# Patient Record
Sex: Female | Born: 1955 | Race: Black or African American | Hispanic: No | Marital: Single | Smoking: Former smoker
Health system: Southern US, Community
[De-identification: ages and names within clinical notes are randomized; demographics above are authoritative.]

## PROBLEM LIST (undated history)

## (undated) DIAGNOSIS — L0390 Cellulitis, unspecified: Secondary | ICD-10-CM

## (undated) DIAGNOSIS — M25519 Pain in unspecified shoulder: Secondary | ICD-10-CM

## (undated) DIAGNOSIS — K589 Irritable bowel syndrome without diarrhea: Secondary | ICD-10-CM

## (undated) DIAGNOSIS — L853 Xerosis cutis: Secondary | ICD-10-CM

## (undated) DIAGNOSIS — K449 Diaphragmatic hernia without obstruction or gangrene: Secondary | ICD-10-CM

## (undated) DIAGNOSIS — I639 Cerebral infarction, unspecified: Secondary | ICD-10-CM

## (undated) DIAGNOSIS — K5900 Constipation, unspecified: Secondary | ICD-10-CM

## (undated) DIAGNOSIS — Z89519 Acquired absence of unspecified leg below knee: Secondary | ICD-10-CM

## (undated) DIAGNOSIS — R0789 Other chest pain: Secondary | ICD-10-CM

## (undated) DIAGNOSIS — I10 Essential (primary) hypertension: Secondary | ICD-10-CM

## (undated) DIAGNOSIS — G629 Polyneuropathy, unspecified: Secondary | ICD-10-CM

## (undated) DIAGNOSIS — E11319 Type 2 diabetes mellitus with unspecified diabetic retinopathy without macular edema: Secondary | ICD-10-CM

## (undated) DIAGNOSIS — E669 Obesity, unspecified: Secondary | ICD-10-CM

## (undated) DIAGNOSIS — K219 Gastro-esophageal reflux disease without esophagitis: Secondary | ICD-10-CM

## (undated) DIAGNOSIS — E785 Hyperlipidemia, unspecified: Secondary | ICD-10-CM

## (undated) DIAGNOSIS — E119 Type 2 diabetes mellitus without complications: Secondary | ICD-10-CM

## (undated) DIAGNOSIS — R42 Dizziness and giddiness: Secondary | ICD-10-CM

## (undated) DIAGNOSIS — Z720 Tobacco use: Secondary | ICD-10-CM

## (undated) DIAGNOSIS — N189 Chronic kidney disease, unspecified: Secondary | ICD-10-CM

## (undated) DIAGNOSIS — R55 Syncope and collapse: Secondary | ICD-10-CM

## (undated) HISTORY — PX: CARDIAC CATHETERIZATION: SHX172

## (undated) HISTORY — PX: OTHER SURGICAL HISTORY: SHX169

## (undated) HISTORY — DX: Obesity, unspecified: E66.9

## (undated) HISTORY — DX: Diaphragmatic hernia without obstruction or gangrene: K44.9

## (undated) HISTORY — DX: Syncope and collapse: R55

## (undated) HISTORY — PX: COLONOSCOPY: SHX174

## (undated) HISTORY — PX: EYE SURGERY: SHX253

## (undated) HISTORY — DX: Pain in unspecified shoulder: M25.519

## (undated) HISTORY — DX: Gastro-esophageal reflux disease without esophagitis: K21.9

## (undated) HISTORY — DX: Irritable bowel syndrome, unspecified: K58.9

## (undated) HISTORY — DX: Acquired absence of unspecified leg below knee: Z89.519

## (undated) HISTORY — PX: CHOLECYSTECTOMY: SHX55

## (undated) HISTORY — DX: Type 2 diabetes mellitus with unspecified diabetic retinopathy without macular edema: E11.319

---

## 1997-09-28 ENCOUNTER — Other Ambulatory Visit: Admission: RE | Admit: 1997-09-28 | Discharge: 1997-09-28 | Payer: Self-pay | Admitting: Internal Medicine

## 1998-12-27 ENCOUNTER — Ambulatory Visit (HOSPITAL_BASED_OUTPATIENT_CLINIC_OR_DEPARTMENT_OTHER): Admission: RE | Admit: 1998-12-27 | Discharge: 1998-12-27 | Payer: Self-pay | Admitting: *Deleted

## 2000-02-14 ENCOUNTER — Encounter: Admission: RE | Admit: 2000-02-14 | Discharge: 2000-02-14 | Payer: Self-pay | Admitting: Internal Medicine

## 2000-02-14 ENCOUNTER — Encounter: Payer: Self-pay | Admitting: Internal Medicine

## 2001-02-05 ENCOUNTER — Ambulatory Visit (HOSPITAL_COMMUNITY): Admission: RE | Admit: 2001-02-05 | Discharge: 2001-02-05 | Payer: Self-pay | Admitting: Internal Medicine

## 2001-02-05 ENCOUNTER — Encounter: Payer: Self-pay | Admitting: Internal Medicine

## 2001-02-25 ENCOUNTER — Ambulatory Visit (HOSPITAL_COMMUNITY): Admission: RE | Admit: 2001-02-25 | Discharge: 2001-02-25 | Payer: Self-pay | Admitting: Gastroenterology

## 2001-05-24 ENCOUNTER — Emergency Department (HOSPITAL_COMMUNITY): Admission: EM | Admit: 2001-05-24 | Discharge: 2001-05-24 | Payer: Self-pay | Admitting: Emergency Medicine

## 2001-07-02 ENCOUNTER — Encounter: Payer: Self-pay | Admitting: Internal Medicine

## 2001-07-02 ENCOUNTER — Encounter: Admission: RE | Admit: 2001-07-02 | Discharge: 2001-07-02 | Payer: Self-pay | Admitting: Internal Medicine

## 2002-09-01 ENCOUNTER — Encounter: Payer: Self-pay | Admitting: Gastroenterology

## 2002-09-01 ENCOUNTER — Encounter: Admission: RE | Admit: 2002-09-01 | Discharge: 2002-09-01 | Payer: Self-pay | Admitting: Gastroenterology

## 2002-09-16 ENCOUNTER — Encounter: Admission: RE | Admit: 2002-09-16 | Discharge: 2002-09-16 | Payer: Self-pay | Admitting: Gastroenterology

## 2002-09-16 ENCOUNTER — Encounter: Payer: Self-pay | Admitting: Gastroenterology

## 2003-03-03 ENCOUNTER — Encounter: Admission: RE | Admit: 2003-03-03 | Discharge: 2003-03-03 | Payer: Self-pay | Admitting: Internal Medicine

## 2003-03-03 ENCOUNTER — Encounter: Payer: Self-pay | Admitting: Internal Medicine

## 2004-03-29 ENCOUNTER — Encounter: Admission: RE | Admit: 2004-03-29 | Discharge: 2004-03-29 | Payer: Self-pay | Admitting: Internal Medicine

## 2005-05-05 ENCOUNTER — Encounter: Admission: RE | Admit: 2005-05-05 | Discharge: 2005-05-05 | Payer: Self-pay | Admitting: Internal Medicine

## 2005-07-20 ENCOUNTER — Emergency Department (HOSPITAL_COMMUNITY): Admission: EM | Admit: 2005-07-20 | Discharge: 2005-07-20 | Payer: Self-pay | Admitting: Family Medicine

## 2005-07-22 ENCOUNTER — Emergency Department (HOSPITAL_COMMUNITY): Admission: EM | Admit: 2005-07-22 | Discharge: 2005-07-22 | Payer: Self-pay | Admitting: Family Medicine

## 2006-06-03 ENCOUNTER — Encounter: Admission: RE | Admit: 2006-06-03 | Discharge: 2006-06-03 | Payer: Self-pay | Admitting: Internal Medicine

## 2007-02-15 ENCOUNTER — Emergency Department (HOSPITAL_COMMUNITY): Admission: EM | Admit: 2007-02-15 | Discharge: 2007-02-15 | Payer: Self-pay | Admitting: Emergency Medicine

## 2007-02-18 ENCOUNTER — Emergency Department (HOSPITAL_COMMUNITY): Admission: EM | Admit: 2007-02-18 | Discharge: 2007-02-18 | Payer: Self-pay | Admitting: Emergency Medicine

## 2007-03-23 ENCOUNTER — Ambulatory Visit: Payer: Self-pay | Admitting: Cardiology

## 2007-03-23 ENCOUNTER — Inpatient Hospital Stay (HOSPITAL_COMMUNITY): Admission: EM | Admit: 2007-03-23 | Discharge: 2007-03-25 | Payer: Self-pay | Admitting: Emergency Medicine

## 2007-03-25 ENCOUNTER — Encounter: Payer: Self-pay | Admitting: Thoracic Surgery (Cardiothoracic Vascular Surgery)

## 2007-04-01 ENCOUNTER — Emergency Department (HOSPITAL_COMMUNITY): Admission: EM | Admit: 2007-04-01 | Discharge: 2007-04-01 | Payer: Self-pay | Admitting: Emergency Medicine

## 2007-04-12 ENCOUNTER — Ambulatory Visit: Payer: Self-pay | Admitting: Cardiology

## 2007-04-12 ENCOUNTER — Inpatient Hospital Stay (HOSPITAL_COMMUNITY): Admission: EM | Admit: 2007-04-12 | Discharge: 2007-04-15 | Payer: Self-pay | Admitting: Emergency Medicine

## 2007-04-12 ENCOUNTER — Emergency Department (HOSPITAL_COMMUNITY): Admission: EM | Admit: 2007-04-12 | Discharge: 2007-04-12 | Payer: Self-pay | Admitting: Emergency Medicine

## 2007-04-13 ENCOUNTER — Ambulatory Visit: Payer: Self-pay | Admitting: *Deleted

## 2007-04-13 ENCOUNTER — Encounter (INDEPENDENT_AMBULATORY_CARE_PROVIDER_SITE_OTHER): Payer: Self-pay | Admitting: Internal Medicine

## 2007-04-23 ENCOUNTER — Emergency Department (HOSPITAL_COMMUNITY): Admission: EM | Admit: 2007-04-23 | Discharge: 2007-04-23 | Payer: Self-pay | Admitting: Emergency Medicine

## 2007-04-27 ENCOUNTER — Inpatient Hospital Stay (HOSPITAL_COMMUNITY): Admission: EM | Admit: 2007-04-27 | Discharge: 2007-05-02 | Payer: Self-pay | Admitting: Emergency Medicine

## 2007-04-28 ENCOUNTER — Encounter (INDEPENDENT_AMBULATORY_CARE_PROVIDER_SITE_OTHER): Payer: Self-pay | Admitting: *Deleted

## 2007-05-07 ENCOUNTER — Emergency Department (HOSPITAL_COMMUNITY): Admission: EM | Admit: 2007-05-07 | Discharge: 2007-05-08 | Payer: Self-pay | Admitting: *Deleted

## 2007-05-26 ENCOUNTER — Inpatient Hospital Stay (HOSPITAL_COMMUNITY): Admission: EM | Admit: 2007-05-26 | Discharge: 2007-06-03 | Payer: Self-pay | Admitting: Emergency Medicine

## 2007-05-26 ENCOUNTER — Ambulatory Visit: Payer: Self-pay | Admitting: Cardiology

## 2007-05-27 ENCOUNTER — Encounter (INDEPENDENT_AMBULATORY_CARE_PROVIDER_SITE_OTHER): Payer: Self-pay | Admitting: Internal Medicine

## 2007-05-27 ENCOUNTER — Ambulatory Visit: Payer: Self-pay | Admitting: Vascular Surgery

## 2007-06-02 ENCOUNTER — Encounter (INDEPENDENT_AMBULATORY_CARE_PROVIDER_SITE_OTHER): Payer: Self-pay | Admitting: Gastroenterology

## 2007-06-08 ENCOUNTER — Emergency Department (HOSPITAL_COMMUNITY): Admission: EM | Admit: 2007-06-08 | Discharge: 2007-06-08 | Payer: Self-pay | Admitting: Emergency Medicine

## 2007-06-16 ENCOUNTER — Ambulatory Visit: Payer: Self-pay | Admitting: Cardiology

## 2007-09-07 ENCOUNTER — Ambulatory Visit: Payer: Self-pay | Admitting: Cardiology

## 2007-09-16 ENCOUNTER — Ambulatory Visit: Payer: Self-pay | Admitting: Internal Medicine

## 2007-09-17 HISTORY — PX: OTHER SURGICAL HISTORY: SHX169

## 2007-09-20 ENCOUNTER — Ambulatory Visit: Payer: Self-pay | Admitting: Cardiology

## 2007-09-20 ENCOUNTER — Inpatient Hospital Stay (HOSPITAL_COMMUNITY): Admission: EM | Admit: 2007-09-20 | Discharge: 2007-09-21 | Payer: Self-pay | Admitting: Emergency Medicine

## 2007-10-12 ENCOUNTER — Encounter: Admission: RE | Admit: 2007-10-12 | Discharge: 2007-10-12 | Payer: Self-pay | Admitting: Internal Medicine

## 2007-10-13 ENCOUNTER — Ambulatory Visit: Payer: Self-pay | Admitting: Internal Medicine

## 2007-11-09 ENCOUNTER — Encounter: Admission: RE | Admit: 2007-11-09 | Discharge: 2007-11-09 | Payer: Self-pay | Admitting: Internal Medicine

## 2007-11-12 ENCOUNTER — Emergency Department (HOSPITAL_COMMUNITY): Admission: EM | Admit: 2007-11-12 | Discharge: 2007-11-12 | Payer: Self-pay | Admitting: Emergency Medicine

## 2008-03-21 ENCOUNTER — Emergency Department (HOSPITAL_COMMUNITY): Admission: EM | Admit: 2008-03-21 | Discharge: 2008-03-21 | Payer: Self-pay | Admitting: Emergency Medicine

## 2008-05-30 DIAGNOSIS — I639 Cerebral infarction, unspecified: Secondary | ICD-10-CM | POA: Insufficient documentation

## 2008-05-30 DIAGNOSIS — E785 Hyperlipidemia, unspecified: Secondary | ICD-10-CM | POA: Insufficient documentation

## 2008-05-30 DIAGNOSIS — E669 Obesity, unspecified: Secondary | ICD-10-CM | POA: Insufficient documentation

## 2008-05-30 DIAGNOSIS — I2510 Atherosclerotic heart disease of native coronary artery without angina pectoris: Secondary | ICD-10-CM | POA: Insufficient documentation

## 2008-05-30 DIAGNOSIS — R55 Syncope and collapse: Secondary | ICD-10-CM

## 2008-05-30 DIAGNOSIS — K219 Gastro-esophageal reflux disease without esophagitis: Secondary | ICD-10-CM | POA: Insufficient documentation

## 2008-05-30 DIAGNOSIS — E1165 Type 2 diabetes mellitus with hyperglycemia: Secondary | ICD-10-CM

## 2008-05-30 DIAGNOSIS — E1140 Type 2 diabetes mellitus with diabetic neuropathy, unspecified: Secondary | ICD-10-CM

## 2008-05-30 DIAGNOSIS — I679 Cerebrovascular disease, unspecified: Secondary | ICD-10-CM | POA: Insufficient documentation

## 2008-11-02 ENCOUNTER — Ambulatory Visit: Payer: Self-pay | Admitting: Cardiology

## 2008-11-02 ENCOUNTER — Inpatient Hospital Stay (HOSPITAL_COMMUNITY): Admission: EM | Admit: 2008-11-02 | Discharge: 2008-11-04 | Payer: Self-pay | Admitting: Emergency Medicine

## 2008-11-02 ENCOUNTER — Telehealth: Payer: Self-pay | Admitting: Internal Medicine

## 2009-01-02 ENCOUNTER — Encounter: Payer: Self-pay | Admitting: Internal Medicine

## 2009-03-12 ENCOUNTER — Ambulatory Visit: Payer: Self-pay | Admitting: Cardiology

## 2009-03-12 ENCOUNTER — Inpatient Hospital Stay (HOSPITAL_COMMUNITY): Admission: EM | Admit: 2009-03-12 | Discharge: 2009-03-14 | Payer: Self-pay | Admitting: Emergency Medicine

## 2009-03-13 ENCOUNTER — Encounter: Payer: Self-pay | Admitting: Cardiology

## 2009-03-13 ENCOUNTER — Encounter: Payer: Self-pay | Admitting: Internal Medicine

## 2009-03-13 ENCOUNTER — Ambulatory Visit: Payer: Self-pay | Admitting: Vascular Surgery

## 2009-03-28 ENCOUNTER — Encounter (INDEPENDENT_AMBULATORY_CARE_PROVIDER_SITE_OTHER): Payer: Self-pay | Admitting: *Deleted

## 2009-04-02 DIAGNOSIS — K449 Diaphragmatic hernia without obstruction or gangrene: Secondary | ICD-10-CM | POA: Insufficient documentation

## 2009-04-02 DIAGNOSIS — I25110 Atherosclerotic heart disease of native coronary artery with unstable angina pectoris: Secondary | ICD-10-CM | POA: Insufficient documentation

## 2009-04-02 DIAGNOSIS — K589 Irritable bowel syndrome without diarrhea: Secondary | ICD-10-CM

## 2009-04-02 DIAGNOSIS — I10 Essential (primary) hypertension: Secondary | ICD-10-CM | POA: Insufficient documentation

## 2009-04-02 DIAGNOSIS — I2510 Atherosclerotic heart disease of native coronary artery without angina pectoris: Secondary | ICD-10-CM | POA: Insufficient documentation

## 2009-04-02 DIAGNOSIS — G609 Hereditary and idiopathic neuropathy, unspecified: Secondary | ICD-10-CM

## 2009-05-31 IMAGING — CR DG ABDOMEN ACUTE W/ 1V CHEST
3 series · 3 of 3 positions shown · non-contrast
Comparison: none

HISTORY: Constipation

[w chest pa]
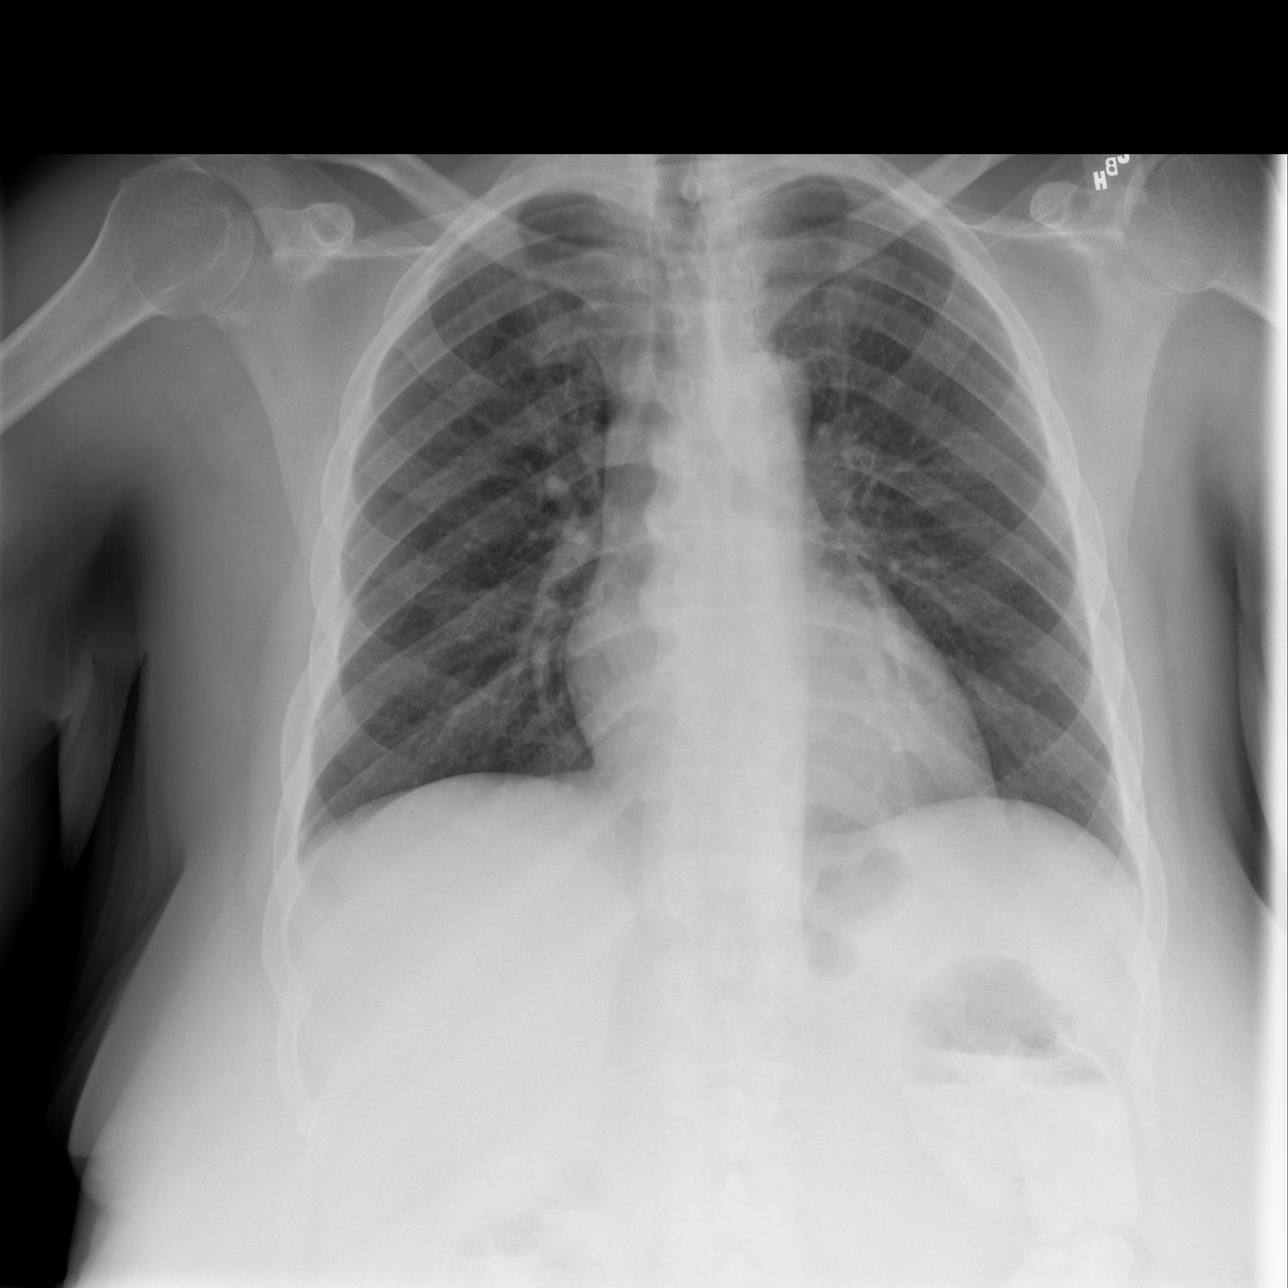

[w abdomen upright]
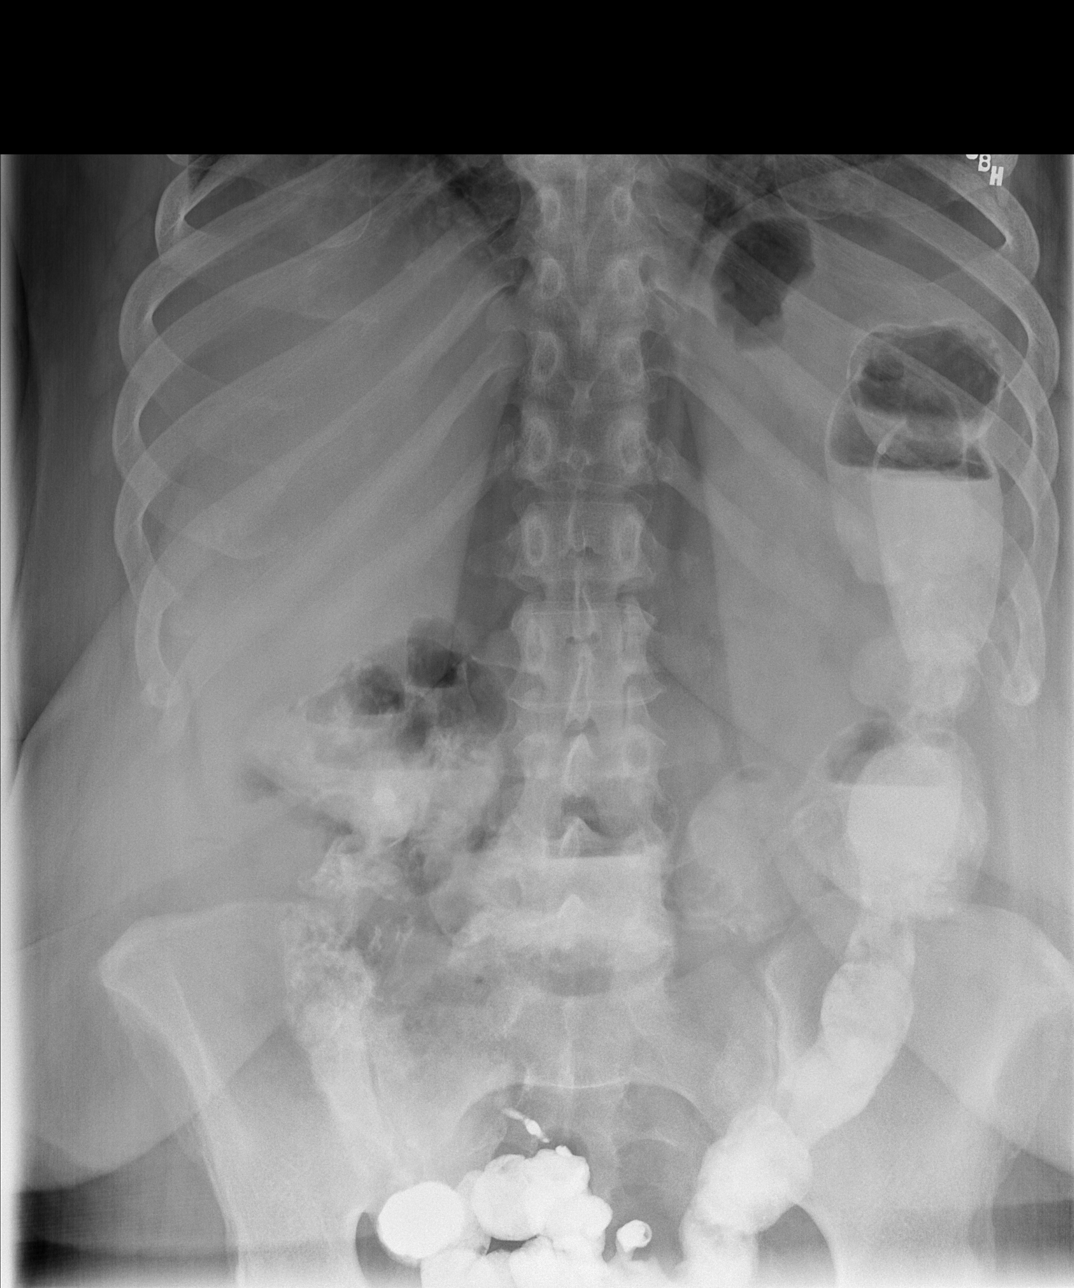

[t abdomen supine]
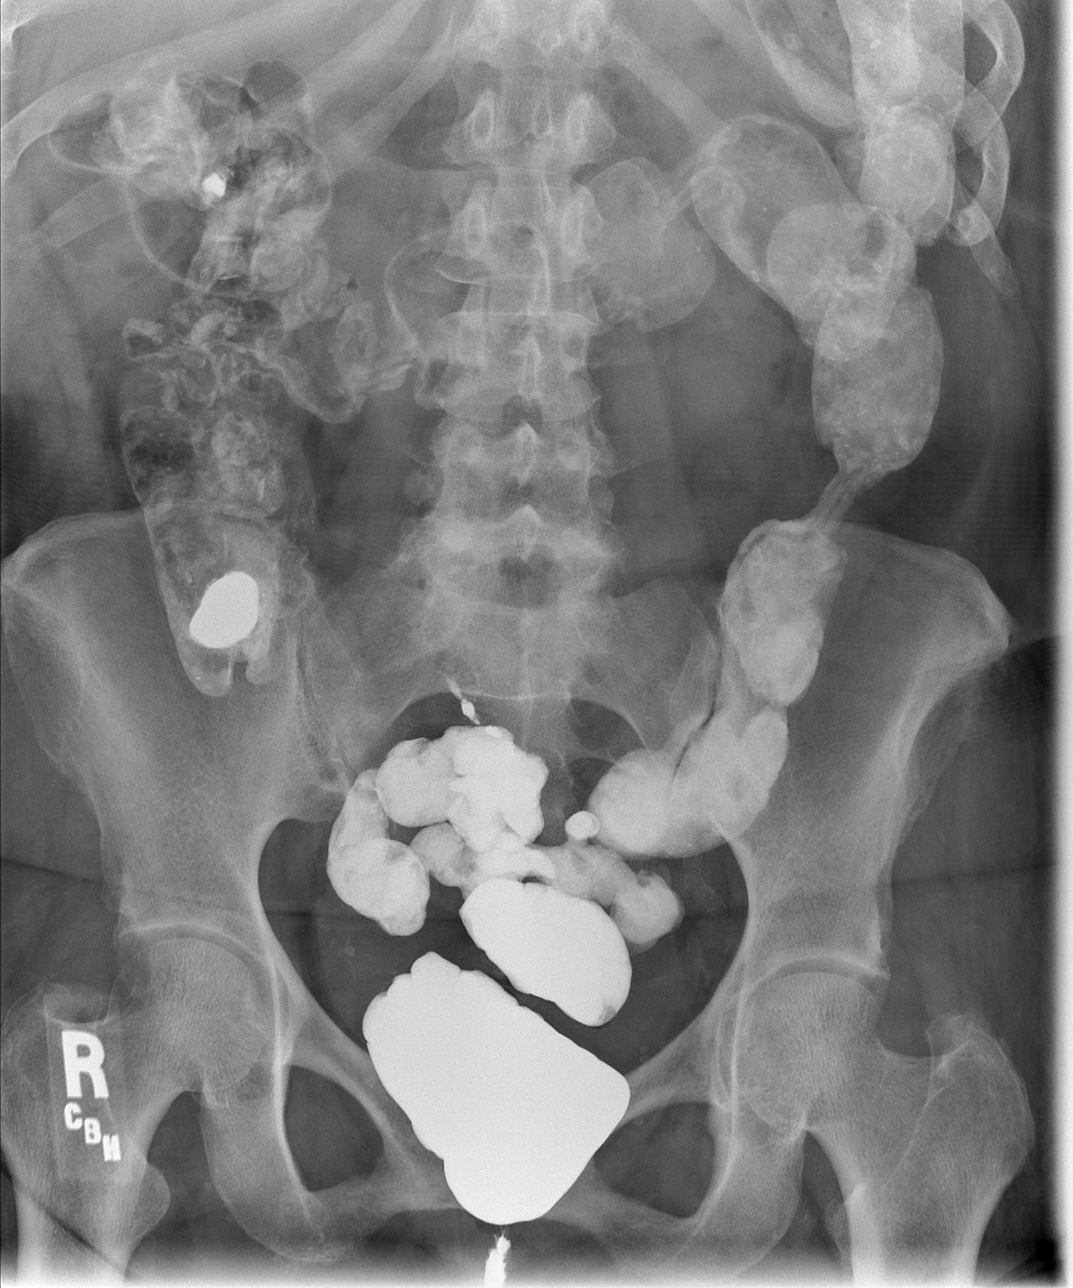

[3 of 3 positions shown; findings below may reference images not displayed]

ABDOMEN ACUTE WITH PA CHEST:

Comparison 04/23/2007

Normal heart size, mediastinal contours, and pulmonary vascularity.
Minimal chronic bronchitic changes.
Lungs otherwise clear.

Retained barium throughout colon, prior GI exam on 05/01/2007.
No bowel dilatation, bowel wall thickening, or free air.
No small bowel distention noted.
Bones unremarkable.
IMPRESSION: Dense retained barium throughout colon status post GI procedure on 05/01/2007.
Otherwise negative exam.

## 2009-10-04 ENCOUNTER — Observation Stay (HOSPITAL_COMMUNITY): Admission: EM | Admit: 2009-10-04 | Discharge: 2009-10-05 | Payer: Self-pay | Admitting: Emergency Medicine

## 2009-10-04 ENCOUNTER — Ambulatory Visit: Payer: Self-pay | Admitting: Cardiology

## 2009-10-16 ENCOUNTER — Emergency Department (HOSPITAL_COMMUNITY): Admission: EM | Admit: 2009-10-16 | Discharge: 2009-10-16 | Payer: Self-pay | Admitting: Emergency Medicine

## 2009-11-09 ENCOUNTER — Observation Stay (HOSPITAL_COMMUNITY): Admission: EM | Admit: 2009-11-09 | Discharge: 2009-11-12 | Payer: Self-pay | Admitting: Emergency Medicine

## 2009-11-12 ENCOUNTER — Telehealth: Payer: Self-pay | Admitting: Internal Medicine

## 2009-12-12 ENCOUNTER — Ambulatory Visit: Payer: Self-pay | Admitting: Cardiology

## 2009-12-31 ENCOUNTER — Ambulatory Visit: Payer: Self-pay

## 2009-12-31 ENCOUNTER — Ambulatory Visit (HOSPITAL_COMMUNITY): Admission: RE | Admit: 2009-12-31 | Discharge: 2009-12-31 | Payer: Self-pay | Admitting: Internal Medicine

## 2009-12-31 ENCOUNTER — Ambulatory Visit: Payer: Self-pay | Admitting: Cardiology

## 2009-12-31 ENCOUNTER — Ambulatory Visit: Payer: Self-pay | Admitting: Internal Medicine

## 2010-03-18 ENCOUNTER — Encounter: Admission: RE | Admit: 2010-03-18 | Discharge: 2010-03-18 | Payer: Self-pay | Admitting: Internal Medicine

## 2010-06-18 NOTE — Assessment & Plan Note (Signed)
Summary: eph/jml   Visit Type:  EPH Primary Provider:  Dr. August Luz  CC:  edema/feet.Marland Kitchendenies any cp or sob.  History of Present Illness: [name] returns to office today after being discharged the hospital with atypical stabbing chest pain. She has a history of multiple cardiac risk factors with nonobstructive disease with medical management for several years. Her ejection fraction in the past has been around 35%.  The last time I saw her was in 2009 for syncope. Extensive evaluation was done including a loop monitor which he still has. She is scheduled to have this out soon with Dr. Johney Frame.  She has had no recurrent syncope or chest pain. She ruled out for myocardial infarction. She was found to have some compression in the cervical and thoracic spine and was evaluated by Dr. Yetta Barre of neurosurgery. Conservative management was recommended.  Meds are reviewed. She seems to be compliant. She has maintained her weight loss from several years ago.  Current Medications (verified): 1)  Aggrenox 25-200 Mg Xr12h-Cap (Aspirin-Dipyridamole) .Marland Kitchen.. 1 Cap Once Daily 2)  Zocor 20 Mg Tabs (Simvastatin) .Marland Kitchen.. 1 Tab At Bedtime 3)  Metformin Hcl 500 Mg Tabs (Metformin Hcl) .Marland Kitchen.. 1 Tab Two Times A Day 4)  Lisinopril 10 Mg Tabs (Lisinopril) .Marland Kitchen.. 1 Tab Once Daily 5)  Lyrica 150 Mg Caps (Pregabalin) .Marland Kitchen.. 1 Cap Two Times A Day 6)  Oxycodone Hcl 5 Mg Caps (Oxycodone Hcl) .Marland Kitchen.. 1 Cap Q 8 Hr As Needed 7)  Glimepiride 4 Mg Tabs (Glimepiride) .Marland Kitchen.. 1 Tab Qam 8)  Lantus 100 Unit/ml Soln (Insulin Glargine) .Marland Kitchen.. 15 Units At Bedtime 9)  Tylenol Extra Strength 500 Mg Tabs (Acetaminophen) .... As Needed  Allergies: 1)  ! Penicillin  Past History:  Past Medical History: Last updated: 04/02/2009 CAD, NATIVE VESSEL (ICD-414.01) non-critical  by cath 11-08 - med rx CARDIOMYOPATHY, SECONDARY (ICD-425.9) HYPERTENSION (ICD-401.9) HYPERLIPIDEMIA (ICD-272.4) SYNCOPE (ICD-780.2)Near-syncopal episode in November, 2008.  LEFT  VENTRICULAR FUNCTION, DECREASED (ICD-429.2) Hx of CVA (ICD-434.91) PERIPHERAL NEUROPATHY (ICD-356.9) OBESITY (ICD-278.00) GERD (ICD-530.81) DIABETES MELLITUS, TYPE II, HX OF (ICD-V12.2) HIATAL HERNIA (ICD-553.3) IRRITABLE BOWEL SYNDROME (ICD-564.1)  Past Surgical History: Last updated: June 20, 2008 5-09 Invasive electrophysiologic study followed by        insertion of an implantable loop recorder.  Colonoscopy. Cholecystectomy.   Multiple toe surgeries.  Cath  Family History: Last updated: 2008/06/20 Mother died at age 15 from pneumonia, end-stage renal   disease, gallbladder cancer and CHF.  Father is alive at age 58's with a   history of diabetes and coronary artery disease.  She has 1 brother and   2 sisters.  There is no history of stroke, diabetes or coronary artery   disease in her siblings.  One sister has a colostomy.   Social History: Last updated: 06/20/2008  The patient is single.  She smokes about a pack a day   over 30 years.  She drinks beers, several beers on Sundays.  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 She   denies any drug use and does not routinely exercise.   Review of Systems       negative other than history of present illness  Vital Signs:  Patient profile:   54 year old female Height:      66 inches Weight:      250 pounds BMI:     40 .50 Pulse rate:   93 / minute Pulse rhythm:   irregular BP sitting:   118 / 72  (  left arm) Cuff size:   large  Vitals Entered By: [name], CMA (December 12, 2009 2:08 PM)  Physical Exam  General:  obese.   Head:  normocephalic and atraumatic Eyes:  PERRLA/EOM intact; conjunctiva and lids normal. Neck:  Neck supple, no JVD. No masses, thyromegaly or abnormal cervical nodes. Chest [name]:  no deformities or breast masses noted Lungs:  Clear bilaterally to auscultation and percussion. Heart:  PMI difficult to appreciate, normal S1-S2, no gallop Msk:  decreased ROM.   Pulses:   diminished in the left lower extremity, feet are warm no signs of ulceration or injury Extremities:  trace left pedal edema and trace right pedal edema.   Neurologic:  Alert and oriented x 3. Skin:  Intact without lesions or rashes. Psych:  Normal affect.   EKG  Procedure date:  12/12/2009  Findings:      normal sinus rhythm, poor R. progression, low voltage.  Impression & Recommendations:  Problem # 1:  CAD, NATIVE VESSEL (ICD-414.01)   Continued aggressive secondary prevention. Meds reviewed. No changes. Followup in a year. The following medications were removed from the medication list:    Carvedilol 3.125 Mg Tabs (Carvedilol) .Marland Kitchen... Take 1 tablet by mouth twice a day    Isosorbide Mononitrate Cr 30 Mg Xr24h-tab (Isosorbide mononitrate) .Marland Kitchen... Take 1/2 tablet by mouth once a day Her updated medication list for this problem includes:    Aggrenox 25-200 Mg Xr12h-cap (Aspirin-dipyridamole) .Marland Kitchen... 1 cap once daily    Lisinopril 10 Mg Tabs (Lisinopril) .Marland Kitchen... 1 tab once daily  The following medications were removed from the medication list:    Carvedilol 3.125 Mg Tabs (Carvedilol) .Marland Kitchen... Take 1 tablet by mouth twice a day    Isosorbide Mononitrate Cr 30 Mg Xr24h-tab (Isosorbide mononitrate) .Marland Kitchen... Take 1/2 tablet by mouth once a day Her updated medication list for this problem includes:    Aggrenox 25-200 Mg Xr12h-cap (Aspirin-dipyridamole) .Marland Kitchen... 1 cap once daily    Lisinopril 10 Mg Tabs (Lisinopril) .Marland Kitchen... 1 tab once daily  Orders: Echocardiogram (Echo)  Problem # 2:  CARDIOMYOPATHY, SECONDARY (ICD-425.9)  Continue beta blockade and ACE inhibitor. Will repeat echocardiogram to evaluate LV function. Last EF was 35%. The following medications were removed from the medication list:    Carvedilol 3.125 Mg Tabs (Carvedilol) .Marland Kitchen... Take 1 tablet by mouth twice a day    Isosorbide Mononitrate Cr 30 Mg Xr24h-tab (Isosorbide mononitrate) .Marland Kitchen... Take 1/2 tablet by mouth once a day Her updated  medication list for this problem includes:    Aggrenox 25-200 Mg Xr12h-cap (Aspirin-dipyridamole) .Marland Kitchen... 1 cap once daily    Lisinopril 10 Mg Tabs (Lisinopril) .Marland Kitchen... 1 tab once daily  Orders: Echocardiogram (Echo)  Problem # 3:  HYPERTENSION (ICD-401.9) Assessment: Improved  The following medications were removed from the medication list:    Carvedilol 3.125 Mg Tabs (Carvedilol) .Marland Kitchen... Take 1 tablet by mouth twice a day Her updated medication list for this problem includes:    Lisinopril 10 Mg Tabs (Lisinopril) .Marland Kitchen... 1 tab once daily  The following medications were removed from the medication list:    Carvedilol 3.125 Mg Tabs (Carvedilol) .Marland Kitchen... Take 1 tablet by mouth twice a day Her updated medication list for this problem includes:    Lisinopril 10 Mg Tabs (Lisinopril) .Marland Kitchen... 1 tab once daily  Problem # 4:  HYPERLIPIDEMIA (ICD-272.4)  Her updated medication list for this problem includes:    Zocor 20 Mg Tabs (Simvastatin) .Marland Kitchen... 1 tab at bedtime  Her updated medication  list for this problem includes:    Zocor 20 Mg Tabs (Simvastatin) .Marland Kitchen... 1 tab at bedtime  Orders: Echocardiogram (Echo)  Problem # 5:  SYNCOPE (ICD-780.2) Resolved. The following medications were removed from the medication list:    Carvedilol 3.125 Mg Tabs (Carvedilol) .Marland Kitchen... Take 1 tablet by mouth twice a day    Isosorbide Mononitrate Cr 30 Mg Xr24h-tab (Isosorbide mononitrate) .Marland Kitchen... Take 1/2 tablet by mouth once a day Her updated medication list for this problem includes:    Aggrenox 25-200 Mg Xr12h-cap (Aspirin-dipyridamole) .Marland Kitchen... 1 cap once daily    Lisinopril 10 Mg Tabs (Lisinopril) .Marland Kitchen... 1 tab once daily  Patient Instructions: 1)  Your physician recommends that you schedule a follow-up appointment in: 12 months with Dr Johney Frame 2)  Your physician recommends that you continue on your current medications as directed. Please refer to the Current Medication list given to you today. 3)  Your physician has  requested that you have an echocardiogram.  Echocardiography is a painless test that uses sound waves to create images of your heart. It provides your doctor with information about the size and shape of your heart and how well your heart's chambers and valves are working.  This procedure takes approximately one hour. There are no restrictions for this procedure.

## 2010-06-18 NOTE — Assessment & Plan Note (Signed)
Summary: appt @9 :00/ eval for loop recorder removal/jml   Visit Type:  Follow-up Primary Provider:  Dr. August Luz   History of Present Illness: The patient presents today for routine electrophysiology followup. She reports doing very well since last being seen clinic.  She denies any further episodes of syncope.  The patient denies symptoms of palpitations, chest pain, shortness of breath, orthopnea, PND, lower extremity edema, dizziness, presyncope, or neurologic sequela. The patient is tolerating medications without difficulties and is otherwise without complaint today.   Current Medications (verified): 1)  Aggrenox 25-200 Mg Xr12h-Cap (Aspirin-Dipyridamole) .Marland Kitchen.. 1 Cap Once Daily 2)  Zocor 20 Mg Tabs (Simvastatin) .Marland Kitchen.. 1 Tab At Bedtime 3)  Metformin Hcl 500 Mg Tabs (Metformin Hcl) .Marland Kitchen.. 1 Tab Two Times A Day 4)  Lisinopril 10 Mg Tabs (Lisinopril) .Marland Kitchen.. 1 Tab Once Daily 5)  Lyrica 150 Mg Caps (Pregabalin) .Marland Kitchen.. 1 Cap Two Times A Day 6)  Oxycodone Hcl 5 Mg Caps (Oxycodone Hcl) .Marland Kitchen.. 1 Cap Q 8 Hr As Needed 7)  Glimepiride 4 Mg Tabs (Glimepiride) .Marland Kitchen.. 1 Tab Qam 8)  Lantus 100 Unit/ml Soln (Insulin Glargine) .Marland Kitchen.. 15 Units At Bedtime 9)  Ibuprofen 800 Mg Tabs (Ibuprofen) .... Uad  Allergies: 1)  ! Penicillin  Past History:  Past Medical History: Reviewed history from 04/02/2009 and no changes required. CAD, NATIVE VESSEL (ICD-414.01) non-critical  by cath 11-08 - med rx CARDIOMYOPATHY, SECONDARY (ICD-425.9) HYPERTENSION (ICD-401.9) HYPERLIPIDEMIA (ICD-272.4) SYNCOPE (ICD-780.2)Near-syncopal episode in November, 2008.  LEFT VENTRICULAR FUNCTION, DECREASED (ICD-429.2) Hx of CVA (ICD-434.91) PERIPHERAL NEUROPATHY (ICD-356.9) OBESITY (ICD-278.00) GERD (ICD-530.81) DIABETES MELLITUS, TYPE II, HX OF (ICD-V12.2) HIATAL HERNIA (ICD-553.3) IRRITABLE BOWEL SYNDROME (ICD-564.1)  Past Surgical History: Reviewed history from 05/30/2008 and no changes required. 5-09 Invasive electrophysiologic  study followed by        insertion of an implantable loop recorder.  Colonoscopy. Cholecystectomy.   Multiple toe surgeries.  Cath  Social History: Reviewed history from 05/30/2008 and no changes required.  The patient is single.  She smokes about a pack a day   over 30 years.  She drinks beers, several beers on Sundays.  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 She   denies any drug use and does not routinely exercise.   Vital Signs:  Patient profile:   54 year old female Height:      66 inches Weight:      255 pounds BMI:     41 .31 Pulse rate:   72 / minute BP sitting:   118 / 82  (right arm)  Vitals Entered By: Laurance Flatten CMA (December 31, 2009 2:49 PM)  Physical Exam  General:  obese, NAD Head:  normocephalic and atraumatic Eyes:  PERRLA/EOM intact; conjunctiva and lids normal. Mouth:  Teeth, gums and palate normal. Oral mucosa normal. Neck:  Neck supple, no JVD. No masses, thyromegaly or abnormal cervical nodes. Lungs:  Clear bilaterally to auscultation and percussion. Heart:  Non-displaced PMI, chest non-tender; regular rate and rhythm, S1, S2 without murmurs, rubs or gallops. Carotid upstroke normal, no bruit. Normal abdominal aortic size, no bruits. Femorals normal pulses, no bruits. Pedals normal pulses. No edema, no varicosities. Abdomen:  Bowel sounds positive; abdomen soft and non-tender without masses, organomegaly, or hernias noted. No hepatosplenomegaly. Msk:  Back normal, normal gait. Muscle strength and tone normal. Pulses:  pulses normal in all 4 extremities Extremities:  No clubbing or cyanosis. Neurologic:  Alert and oriented x 3.    ILR  MD Comments:  Battery status is good. 13 episodes of FVT detected, however review demonstrates that these episodes are due to T wave oversensing.  No arrhythmias.  Impression & Recommendations:  Problem # 1:  SYNCOPE (ICD-780.2) No further syncope. ILR interrogation today reveals no  arrhythmias. As her ILR battery is still good and patient is having no problems, I would recommend that the monitor not be removed until the battery his EOL. The patient agrees with this approach.  Problem # 2:  CARDIOMYOPATHY, SECONDARY (ICD-425.9) EF now normal with medical therapy.  Pt is aware. no changes today  Patient Instructions: 1)  Pt to follow-up with Dr Ladona Ridgel in 6 months to consider ILR removal once the device is no longer functional.

## 2010-06-18 NOTE — Progress Notes (Signed)
Summary: wants loop recorder removed while at hospital  Phone Note Call from Patient   Caller: Patient [phone] Reason for Call: Talk to Nurse Summary of Call: pt has a loop recorder-to have removed in august by dr Myeesha Shane-in hospital now, wants to know if she can have it removed while in hosptial-pls call-at [facility] [phone] Initial call taken by: Glynda Jaeger,  November 12, 2009 10:32 AM  Follow-up for Phone Call        spoke with Rosann Auerbach  she will have Dr Johney Frame go by and see patient Dennis Bast, RN, BSN  November 12, 2009 12:59 PM

## 2010-08-04 LAB — DIFFERENTIAL
Eosinophils Absolute: 0 10*3/uL (ref 0.0–0.7)
Eosinophils Relative: 0 % (ref 0–5)
Lymphocytes Relative: 22 % (ref 12–46)
Lymphs Abs: 2.4 10*3/uL (ref 0.7–4.0)
Monocytes Absolute: 0.3 10*3/uL (ref 0.1–1.0)
Monocytes Relative: 3 % (ref 3–12)

## 2010-08-04 LAB — POCT CARDIAC MARKERS
CKMB, poc: 1.2 ng/mL (ref 1.0–8.0)
CKMB, poc: <1 ng/mL — ABNORMAL LOW (ref 1.0–8.0)
Troponin i, poc: <0.05 ng/mL (ref 0.00–0.09)
Troponin i, poc: <0.05 ng/mL (ref 0.00–0.09)

## 2010-08-04 LAB — COMPREHENSIVE METABOLIC PANEL
ALT: 12 U/L (ref 0–35)
ALT: 15 U/L (ref 0–35)
Albumin: 3.1 g/dL — ABNORMAL LOW (ref 3.5–5.2)
Alkaline Phosphatase: 126 U/L — ABNORMAL HIGH (ref 39–117)
Alkaline Phosphatase: 150 U/L — ABNORMAL HIGH (ref 39–117)
BUN: 8 mg/dL (ref 6–23)
CO2: 25 mEq/L (ref 19–32)
Chloride: 101 mEq/L (ref 96–112)
Chloride: 103 mEq/L (ref 96–112)
Glucose, Bld: 243 mg/dL — ABNORMAL HIGH (ref 70–99)
Potassium: 4.1 mEq/L (ref 3.5–5.1)
Potassium: 4.1 mEq/L (ref 3.5–5.1)
Sodium: 133 mEq/L — ABNORMAL LOW (ref 135–145)
Sodium: 135 mEq/L (ref 135–145)
Total Bilirubin: 0.4 mg/dL (ref 0.3–1.2)
Total Bilirubin: 0.5 mg/dL (ref 0.3–1.2)
Total Protein: 5.7 g/dL — ABNORMAL LOW (ref 6.0–8.3)
Total Protein: 6.4 g/dL (ref 6.0–8.3)

## 2010-08-04 LAB — CBC
HCT: 34.8 % — ABNORMAL LOW (ref 36.0–46.0)
HCT: 39 % (ref 36.0–46.0)
HCT: 46.2 % — ABNORMAL HIGH (ref 36.0–46.0)
Hemoglobin: 11.9 g/dL — ABNORMAL LOW (ref 12.0–15.0)
Hemoglobin: 15.3 g/dL — ABNORMAL HIGH (ref 12.0–15.0)
MCH: 28.5 pg (ref 26.0–34.0)
MCV: 85.7 fL (ref 78.0–100.0)
MCV: 85.8 fL (ref 78.0–100.0)
Platelets: 189 10*3/uL (ref 150–400)
Platelets: 196 10*3/uL (ref 150–400)
RBC: 4.1 MIL/uL (ref 3.87–5.11)
RBC: 4.55 MIL/uL (ref 3.87–5.11)
RBC: 5.38 MIL/uL — ABNORMAL HIGH (ref 3.87–5.11)
RDW: 14.6 % (ref 11.5–15.5)
RDW: 15.2 % (ref 11.5–15.5)
WBC: 10.2 10*3/uL (ref 4.0–10.5)
WBC: 8.9 10*3/uL (ref 4.0–10.5)

## 2010-08-04 LAB — URINE MICROSCOPIC-ADD ON

## 2010-08-04 LAB — GLUCOSE, CAPILLARY
Glucose-Capillary: 224 mg/dL — ABNORMAL HIGH (ref 70–99)
Glucose-Capillary: 270 mg/dL — ABNORMAL HIGH (ref 70–99)
Glucose-Capillary: 330 mg/dL — ABNORMAL HIGH (ref 70–99)

## 2010-08-04 LAB — URINALYSIS, ROUTINE W REFLEX MICROSCOPIC
Nitrite: NEGATIVE
Specific Gravity, Urine: 1.036 — ABNORMAL HIGH (ref 1.005–1.030)
Urobilinogen, UA: 0.2 mg/dL (ref 0.0–1.0)
pH: 5 (ref 5.0–8.0)

## 2010-08-04 LAB — T4, FREE: Free T4: 1.48 ng/dL (ref 0.80–1.80)

## 2010-08-04 LAB — CULTURE, BLOOD (ROUTINE X 2): Culture: NO GROWTH

## 2010-08-04 LAB — CARDIAC PANEL(CRET KIN+CKTOT+MB+TROPI)
CK, MB: 0.7 ng/mL (ref 0.3–4.0)
CK, MB: 0.9 ng/mL (ref 0.3–4.0)
Relative Index: INVALID (ref 0.0–2.5)
Troponin I: 0.02 ng/mL (ref 0.00–0.06)

## 2010-08-04 LAB — MAGNESIUM: Magnesium: 1.8 mg/dL (ref 1.5–2.5)

## 2010-08-04 LAB — CK TOTAL AND CKMB (NOT AT ARMC)
CK, MB: 1.4 ng/mL (ref 0.3–4.0)
Relative Index: INVALID (ref 0.0–2.5)
Total CK: 72 U/L (ref 7–177)

## 2010-08-04 LAB — TSH: TSH: 0.522 u[IU]/mL (ref 0.350–4.500)

## 2010-08-04 LAB — BASIC METABOLIC PANEL
CO2: 25 mEq/L (ref 19–32)
Chloride: 100 mEq/L (ref 96–112)
Creatinine, Ser: 0.8 mg/dL (ref 0.4–1.2)
GFR calc Af Amer: >60 mL/min (ref >60)
Potassium: 4.3 mEq/L (ref 3.5–5.1)

## 2010-08-04 LAB — HEMOGLOBIN A1C: Mean Plasma Glucose: 352 mg/dL — ABNORMAL HIGH (ref <117)

## 2010-08-05 LAB — BASIC METABOLIC PANEL
BUN: 12 mg/dL (ref 6–23)
CO2: 26 mEq/L (ref 19–32)
Calcium: 8.7 mg/dL (ref 8.4–10.5)
Calcium: 9.3 mg/dL (ref 8.4–10.5)
Chloride: 104 mEq/L (ref 96–112)
Creatinine, Ser: 0.63 mg/dL (ref 0.4–1.2)
Creatinine, Ser: 0.87 mg/dL (ref 0.4–1.2)
GFR calc Af Amer: >60 mL/min (ref >60)
Glucose, Bld: 281 mg/dL — ABNORMAL HIGH (ref 70–99)
Sodium: 135 mEq/L (ref 135–145)

## 2010-08-05 LAB — CBC
Hemoglobin: 14.2 g/dL (ref 12.0–15.0)
Hemoglobin: 14.4 g/dL (ref 12.0–15.0)
MCHC: 33.8 g/dL (ref 30.0–36.0)
Platelets: 205 10*3/uL (ref 150–400)
RBC: 5.02 MIL/uL (ref 3.87–5.11)
RDW: 15.3 % (ref 11.5–15.5)
WBC: 11.1 10*3/uL — ABNORMAL HIGH (ref 4.0–10.5)

## 2010-08-05 LAB — URINALYSIS, ROUTINE W REFLEX MICROSCOPIC
Glucose, UA: >1000 mg/dL — AB
Glucose, UA: >1000 mg/dL — AB
Hgb urine dipstick: NEGATIVE
Ketones, ur: 15 mg/dL — AB
Ketones, ur: NEGATIVE mg/dL
Nitrite: NEGATIVE
Protein, ur: NEGATIVE mg/dL
Urobilinogen, UA: 1 mg/dL (ref 0.0–1.0)
pH: 5 (ref 5.0–8.0)
pH: 5.5 (ref 5.0–8.0)

## 2010-08-05 LAB — DIFFERENTIAL
Basophils Absolute: 0.1 10*3/uL (ref 0.0–0.1)
Basophils Relative: 1 % (ref 0–1)
Lymphocytes Relative: 22 % (ref 12–46)
Lymphocytes Relative: 25 % (ref 12–46)
Lymphs Abs: 2.4 10*3/uL (ref 0.7–4.0)
Monocytes Absolute: 0.4 10*3/uL (ref 0.1–1.0)
Monocytes Absolute: 0.4 10*3/uL (ref 0.1–1.0)
Monocytes Relative: 3 % (ref 3–12)
Neutro Abs: 6.8 10*3/uL (ref 1.7–7.7)
Neutro Abs: 8.3 10*3/uL — ABNORMAL HIGH (ref 1.7–7.7)
Neutrophils Relative %: 70 % (ref 43–77)
Neutrophils Relative %: 74 % (ref 43–77)

## 2010-08-05 LAB — RAPID URINE DRUG SCREEN, HOSP PERFORMED
Amphetamines: NOT DETECTED
Barbiturates: NOT DETECTED
Benzodiazepines: NOT DETECTED
Cocaine: NOT DETECTED
Opiates: NOT DETECTED
Tetrahydrocannabinol: NOT DETECTED

## 2010-08-05 LAB — POCT CARDIAC MARKERS
CKMB, poc: <1 ng/mL — ABNORMAL LOW (ref 1.0–8.0)
CKMB, poc: <1 ng/mL — ABNORMAL LOW (ref 1.0–8.0)
Myoglobin, poc: 42.7 ng/mL (ref 12–200)
Myoglobin, poc: 56.7 ng/mL (ref 12–200)
Myoglobin, poc: 62.2 ng/mL (ref 12–200)
Troponin i, poc: <0.05 ng/mL (ref 0.00–0.09)

## 2010-08-05 LAB — CARDIAC PANEL(CRET KIN+CKTOT+MB+TROPI)
CK, MB: 0.9 ng/mL (ref 0.3–4.0)
CK, MB: 0.9 ng/mL (ref 0.3–4.0)
Relative Index: INVALID (ref 0.0–2.5)
Total CK: 84 U/L (ref 7–177)
Troponin I: <0.01 ng/mL (ref 0.00–0.06)

## 2010-08-05 LAB — URINE MICROSCOPIC-ADD ON

## 2010-08-05 LAB — GLUCOSE, CAPILLARY
Glucose-Capillary: 196 mg/dL — ABNORMAL HIGH (ref 70–99)
Glucose-Capillary: 279 mg/dL — ABNORMAL HIGH (ref 70–99)
Glucose-Capillary: 352 mg/dL — ABNORMAL HIGH (ref 70–99)

## 2010-08-05 LAB — HEMOGLOBIN A1C: Mean Plasma Glucose: 352 mg/dL — ABNORMAL HIGH (ref <117)

## 2010-08-05 LAB — CK TOTAL AND CKMB (NOT AT ARMC)
Relative Index: INVALID (ref 0.0–2.5)
Total CK: 68 U/L (ref 7–177)
Total CK: 69 U/L (ref 7–177)

## 2010-08-05 LAB — D-DIMER, QUANTITATIVE
D-Dimer, Quant: 0.22 ug/mL-FEU (ref 0.00–0.48)
D-Dimer, Quant: 0.23 ug/mL-FEU (ref 0.00–0.48)

## 2010-08-22 LAB — GLUCOSE, CAPILLARY
Glucose-Capillary: 150 mg/dL — ABNORMAL HIGH (ref 70–99)
Glucose-Capillary: 152 mg/dL — ABNORMAL HIGH (ref 70–99)
Glucose-Capillary: 208 mg/dL — ABNORMAL HIGH (ref 70–99)
Glucose-Capillary: 223 mg/dL — ABNORMAL HIGH (ref 70–99)
Glucose-Capillary: 247 mg/dL — ABNORMAL HIGH (ref 70–99)
Glucose-Capillary: 248 mg/dL — ABNORMAL HIGH (ref 70–99)
Glucose-Capillary: 272 mg/dL — ABNORMAL HIGH (ref 70–99)
Glucose-Capillary: 275 mg/dL — ABNORMAL HIGH (ref 70–99)
Glucose-Capillary: 298 mg/dL — ABNORMAL HIGH (ref 70–99)
Glucose-Capillary: 335 mg/dL — ABNORMAL HIGH (ref 70–99)
Glucose-Capillary: 354 mg/dL — ABNORMAL HIGH (ref 70–99)

## 2010-08-22 LAB — POCT CARDIAC MARKERS
CKMB, poc: <1 ng/mL — ABNORMAL LOW (ref 1.0–8.0)
CKMB, poc: <1 ng/mL — ABNORMAL LOW (ref 1.0–8.0)
Myoglobin, poc: 71.3 ng/mL (ref 12–200)
Myoglobin, poc: 77 ng/mL (ref 12–200)
Troponin i, poc: <0.05 ng/mL (ref 0.00–0.09)
Troponin i, poc: <0.05 ng/mL (ref 0.00–0.09)

## 2010-08-22 LAB — CBC
Hemoglobin: 13.9 g/dL (ref 12.0–15.0)
Platelets: 185 10*3/uL (ref 150–400)
RDW: 15.1 % (ref 11.5–15.5)

## 2010-08-22 LAB — HEPATIC FUNCTION PANEL
ALT: 26 U/L (ref 0–35)
AST: 17 U/L (ref 0–37)
Albumin: 3.3 g/dL — ABNORMAL LOW (ref 3.5–5.2)
Total Protein: 6.9 g/dL (ref 6.0–8.3)

## 2010-08-22 LAB — BASIC METABOLIC PANEL
CO2: 26 mEq/L (ref 19–32)
Calcium: 8.5 mg/dL (ref 8.4–10.5)
Creatinine, Ser: 0.71 mg/dL (ref 0.4–1.2)
GFR calc Af Amer: >60 mL/min (ref >60)

## 2010-08-22 LAB — LIPID PANEL
Total CHOL/HDL Ratio: 5.7 RATIO
Triglycerides: 188 mg/dL — ABNORMAL HIGH (ref <150)
VLDL: 38 mg/dL (ref 0–40)

## 2010-08-22 LAB — DIFFERENTIAL
Basophils Absolute: 0.1 10*3/uL (ref 0.0–0.1)
Lymphocytes Relative: 24 % (ref 12–46)
Neutro Abs: 5.2 10*3/uL (ref 1.7–7.7)
Neutrophils Relative %: 70 % (ref 43–77)

## 2010-08-22 LAB — TSH: TSH: 1.06 u[IU]/mL (ref 0.350–4.500)

## 2010-08-22 LAB — CARDIAC PANEL(CRET KIN+CKTOT+MB+TROPI)
Relative Index: INVALID (ref 0.0–2.5)
Relative Index: INVALID (ref 0.0–2.5)
Relative Index: INVALID (ref 0.0–2.5)
Total CK: 42 U/L (ref 7–177)
Total CK: 47 U/L (ref 7–177)

## 2010-08-22 LAB — SEDIMENTATION RATE: Sed Rate: 30 mm/hr — ABNORMAL HIGH (ref 0–22)

## 2010-08-22 LAB — C-REACTIVE PROTEIN: CRP: 1.6 mg/dL — ABNORMAL HIGH (ref <0.6)

## 2010-08-22 LAB — VITAMIN B12: Vitamin B-12: 454 pg/mL (ref 211–911)

## 2010-08-22 LAB — TROPONIN I: Troponin I: 0.01 ng/mL (ref 0.00–0.06)

## 2010-08-26 LAB — D-DIMER, QUANTITATIVE: D-Dimer, Quant: <0.22 ug/mL-FEU (ref 0.00–0.48)

## 2010-08-26 LAB — BASIC METABOLIC PANEL
BUN: 13 mg/dL (ref 6–23)
CO2: 24 mEq/L (ref 19–32)
CO2: 27 mEq/L (ref 19–32)
Chloride: 105 mEq/L (ref 96–112)
Chloride: 99 mEq/L (ref 96–112)
Creatinine, Ser: 0.8 mg/dL (ref 0.4–1.2)
GFR calc Af Amer: >60 mL/min (ref >60)
Glucose, Bld: 272 mg/dL — ABNORMAL HIGH (ref 70–99)
Glucose, Bld: 384 mg/dL — ABNORMAL HIGH (ref 70–99)
Potassium: 4.3 mEq/L (ref 3.5–5.1)
Sodium: 136 mEq/L (ref 135–145)

## 2010-08-26 LAB — LIPID PANEL
Total CHOL/HDL Ratio: 5.2 RATIO
VLDL: 37 mg/dL (ref 0–40)

## 2010-08-26 LAB — CBC
HCT: 40.4 % (ref 36.0–46.0)
Hemoglobin: 11.4 g/dL — ABNORMAL LOW (ref 12.0–15.0)
Hemoglobin: 12.3 g/dL (ref 12.0–15.0)
MCHC: 32.7 g/dL (ref 30.0–36.0)
MCHC: 33.4 g/dL (ref 30.0–36.0)
MCV: 86.1 fL (ref 78.0–100.0)
MCV: 86.2 fL (ref 78.0–100.0)
MCV: 86.2 fL (ref 78.0–100.0)
Platelets: 208 10*3/uL (ref 150–400)
RBC: 3.96 MIL/uL (ref 3.87–5.11)
RBC: 4.3 MIL/uL (ref 3.87–5.11)
RDW: 15.5 % (ref 11.5–15.5)
RDW: 15.6 % — ABNORMAL HIGH (ref 11.5–15.5)
WBC: 9.4 10*3/uL (ref 4.0–10.5)

## 2010-08-26 LAB — PROTIME-INR
INR: 0.9 (ref 0.00–1.49)
INR: 1 (ref 0.00–1.49)
Prothrombin Time: 13.7 seconds (ref 11.6–15.2)

## 2010-08-26 LAB — DIFFERENTIAL
Basophils Relative: 0 % (ref 0–1)
Eosinophils Absolute: 0 10*3/uL (ref 0.0–0.7)
Lymphs Abs: 2.3 10*3/uL (ref 0.7–4.0)
Monocytes Absolute: 0.4 10*3/uL (ref 0.1–1.0)
Monocytes Relative: 4 % (ref 3–12)
Neutrophils Relative %: 72 % (ref 43–77)

## 2010-08-26 LAB — TSH: TSH: 1.569 u[IU]/mL (ref 0.350–4.500)

## 2010-08-26 LAB — CARDIAC PANEL(CRET KIN+CKTOT+MB+TROPI)
Relative Index: INVALID (ref 0.0–2.5)
Troponin I: 0.01 ng/mL (ref 0.00–0.06)

## 2010-08-26 LAB — COMPREHENSIVE METABOLIC PANEL
ALT: 17 U/L (ref 0–35)
AST: 19 U/L (ref 0–37)
CO2: 24 mEq/L (ref 19–32)
Chloride: 103 mEq/L (ref 96–112)
GFR calc Af Amer: >60 mL/min (ref >60)
GFR calc non Af Amer: >60 mL/min (ref >60)
Glucose, Bld: 317 mg/dL — ABNORMAL HIGH (ref 70–99)
Sodium: 138 mEq/L (ref 135–145)
Total Bilirubin: 0.7 mg/dL (ref 0.3–1.2)

## 2010-08-26 LAB — CK TOTAL AND CKMB (NOT AT ARMC): Relative Index: INVALID (ref 0.0–2.5)

## 2010-08-26 LAB — MAGNESIUM: Magnesium: 1.9 mg/dL (ref 1.5–2.5)

## 2010-08-26 LAB — LIPASE, BLOOD: Lipase: 13 U/L (ref 11–59)

## 2010-08-26 LAB — GLUCOSE, CAPILLARY
Glucose-Capillary: 214 mg/dL — ABNORMAL HIGH (ref 70–99)
Glucose-Capillary: 255 mg/dL — ABNORMAL HIGH (ref 70–99)
Glucose-Capillary: 382 mg/dL — ABNORMAL HIGH (ref 70–99)

## 2010-08-26 LAB — HEPARIN LEVEL (UNFRACTIONATED): Heparin Unfractionated: 0.29 IU/mL — ABNORMAL LOW (ref 0.30–0.70)

## 2010-08-26 LAB — HEMOGLOBIN A1C: Mean Plasma Glucose: 229 mg/dL

## 2010-10-01 NOTE — Cardiovascular Report (Signed)
[name], [name]              ACCOUNT NO.:  [id number]   MEDICAL RECORD NO.:  [id number]          PATIENT TYPE:  INP   LOCATION:  3733                         FACILITY:  [facility]   PHYSICIAN:  Arturo Morton. Riley Kill, MD, FACCDATE OF BIRTH:  12-07-1955   DATE OF PROCEDURE:  03/24/2007  DATE OF DISCHARGE:                            CARDIAC CATHETERIZATION   INDICATIONS:  [name] is a 55 year old who has a history of smoking.  She also has a history of obesity and diabetes.  She presented with some  recurrent chest discomfort.  She has also had some epigastric  discomfort.  The current study is done to assess coronary anatomy.   PROCEDURE:  1. Left heart catheterization  2. Selective coronary arteriography.  3. Selective left ventriculography.   DESCRIPTION OF PROCEDURE:  The patient was brought to the  catheterization laboratory and prepped and draped in usual fashion.  Through an anterior puncture, the right femoral artery was entered.  Importantly, we took considerable time to identify the best location for  entry.  This was well above the crease based upon a careful analysis.  We were able to enter without difficulty with an anterior stick.  A 5-  French sheath was placed.  Following this, views of left and right  coronaries were obtained in multiple angiographic projections.  Central  aortic and left ventricular pressures measured with pigtail.  Ventriculography was performed in the RAO projection.  There were no  complications.  She was taken to the holding area in satisfactory  clinical condition.   HEMODYNAMIC DATA:  1. Central aortic pressure 139/77, mean 98.  2. Left ventricular pressure 121/11.  3. No gradient pullback across the aortic valve.   ANGIOGRAPHIC DATA:  1. Ventriculography was done in the RAO projection.  There appeared to      be global hypokinesis.  Estimated ejection fraction would be in the      35-40% range.  2. On plain fluoroscopy there is  calcification of both the proximal      left anterior descending as well as the right coronary artery.  3. Left main is free of critical disease.  4. The LAD courses to the apex.  The LAD is calcified proximally and      has eccentric plaquing that measures about 40% luminal reduction.      The vessel then opens up and has 30-40% segmental plaque the mid      vessel has a diagonal has about a 50% area of segmental plaquing.      There is a tiny diagonal which is has significant proximal      narrowing, but is not a significant vessel.  5. There is a ramus intermedius. The ramus intermedius also has      diffuse disease with about a 50% narrowing, but prior to the      bifurcation. There is an inferior bifurcation, and the inferior      bifurcation has mild irregularity.  The superior bifurcation also      divides, and it divides again with 70-80% narrowing in one branch  and 90% in another small branch.  The AV circumflex provides a      small marginal, then provides a bifurcating marginal.  The      bifurcating marginal has diffuse irregularity and a small branch      and then a 75-80% area of focal stenosis with diffuse distal      disease typical of diabetes. The AV circumflex has probably about      50-60% narrowing at the ostium after the takeoff of the marginal.  6. The right coronary artery is calcified proximally.  The RCA      demonstrates an ostial calcified diffuse segmental plaquing of      probably 70% leading into the midvessel.  The distal vessel opens      up and is a large-caliber vessel that would be suitable for      grafting.   CONCLUSION:  1. Global hypokinesis, out of proportion to the patient's coronary      artery disease, question diabetic cardiomyopathy.  2. Diffuse three-vessel disease with segmental 70% to 80% proximal      RCA, 70 to 80% marginal and intermediate disease described as      above.   DISPOSITION:  Options at this point would likely  include either  aggressive medical therapy versus consideration of revascularization  surgery.  She does not have critical disease of the LAD.  However, she  does have diffuse three-vessel disease.  Clearly she needs to stop  smoking.  Aggressive attention to her medical treatment.  In addition,  she has global hypokinesis that is out of proportion. We will get a 2-D  echo. I will also likely have the surgeons review the films in some  detail before we settle on a final treatment plan.      Arturo Morton. Riley Kill, MD, [facility]  Electronically Signed     TDS/MEDQ  D:  03/24/2007  T:  03/25/2007  Job:  [id number]   cc:   Arturo Morton. Riley Kill, MD, [facility]  CV Laboratory  Jesse Sans. Daleen Squibb, MD, [facility]  [name], M.D.

## 2010-10-01 NOTE — Discharge Summary (Signed)
[name], [name]              ACCOUNT NO.:  [id number]   MEDICAL RECORD NO.:  [id number]          PATIENT TYPE:  INP   LOCATION:  3733                         FACILITY:  [facility]   PHYSICIAN:  [name], M.D.   DATE OF BIRTH:  21-Jan-1956   DATE OF ADMISSION:  03/23/2007  DATE OF DISCHARGE:  03/25/2007                               DISCHARGE SUMMARY   DISCHARGE DIAGNOSES:  1. Chest pain with cardiac catheterization showing diffuse disease      with global hypokinesis, medical management.  2. Epigastric pain, gastroesophageal reflux disease.  3. Diabetes.  4. Hypertension.  5. Dyslipidemia.  6. Tobacco abuse.  7. Obesity.  8. Cataracts.   DISCHARGE MEDICATIONS:  1. Amaryl 4 mg twice daily.  2. Metformin 1000 mg twice daily.  3. Nexium 40 mg daily.  4. Lisinopril 10 mg daily.  5. Hydrochlorothiazide 25 mg daily.  6. Simvastatin 20 mg at bedtime.  7. Aspirin 81 mg daily.  8. Imdur 15 mg daily.  9. Fish oil 2 g daily.  10.Coreg 3.125 mg twice daily.  The cc a copy of the dictated      discharge summary to Dr. Daleen Squibb cardiology and Dr. Vladimir Creeks internal      medicine   CONSULTANTS:  [facility], Dr. Daleen Squibb.   PROCEDURES:  1. Cardiac catheterization that showed global hypokinesis out of      proportion to coronary artery disease, question of diabetic      cardiomyopathy, diffuse three-vessel disease with segmental 70-80%      proximal right coronary artery, 70-80% marginal and intermediate      disease.  2. Two-dimensional echocardiogram showing decreased left ventricular      ejection fraction of 30-40%.   HISTORY OF PRESENT ILLNESS:  The patient is a 55 year old, African-  American female that presented to the ED with chest pain.  She stated  that she started feeling really dizzy with headache the day prior to  coming in.  She went home from work and she started to get some chest  pain for about a day now.  The chest pain was in the center of her chest  radiating  across to the left side of her chest.  Each episode lasted for  a few minutes.  She also complains of pain in her back.  She complains  of nausea and some diaphoresis with the pain that also has now resolved.  She complains of pain in the epigastric area, swelling, tenderness and  increased burping when she eats.   Past medical history, family history, social history, medications,  allergies, review of systems per admission H&P.   PHYSICAL EXAMINATION ON DISCHARGE:  VITAL SIGNS:  Temperature 97.6,  pulse of 35, respirations 18, blood pressure 109/74, pulse of 97% on  room air.  CBG 124-182.  HEENT:  Head is normocephalic, atraumatic.  Pupils equal, round and reactive to light.  She has cataract in one eye.  LUNGS:  Clear bilaterally.  No wheezes, rhonchi or rales.  ABDOMEN:  Soft, nontender, nondistended.  Positive bowel sounds.  EXTREMITIES:  Without edema.  HEART:  Regular  rate and rhythm.   HOSPITAL COURSE:  Problem 1.  CHEST PAIN:  She was admitted to the  hospital.  Cardiac enzymes were cycled.  Cardiology was consulted.  She  had a cardiac catheterization that showed diffuse disease.  She was  encouraged on risk factor modification and will try to monitor and  tightly control hypertension, diabetes and high cholesterol.  She was  encouraged to quit smoking.  She was discharged on medication for her  heart disease with beta-blocker, ACE inhibitor and aspirin a day.   Problem 2.  DIABETES:  Will tightly control her diabetes.  Blood sugars  were good while inpatient.   Problem 3.  HYPERTENSION:  Blood pressure is also well-controlled.  Will  continue her on all the outpatient medication in addition to Coreg and  Imdur added by cardiology.   Problem 4.  TOBACCO ABUSE:  Encouraged smoking cessation.   DISCHARGE LABORATORY DATA AND X-RAY FINDINGS:  Sodium 136, potassium  4.2, chloride 99, CO2 27, BUN 13, creatinine 0.75, glucose 94, calcium  9.1.  WBC 9.7, hemoglobin 13.2,  platelets 234.  D-dimer 0.45.  Total  cholesterol 152, triglycerides 204, HDL of 34, LDL 77.  Troponin 0.07,  CK 38, relative index 1.3.  TSH of 0.797, hemoglobin A1c of 8.1.  Lipase  34.  Liver function tests normal with Alk phos 143, AST 19, ALT 23,  albumin 3.7.   Chest x-ray showed no acute cardiopulmonary disease.  CT of the head  showed no acute intracranial abnormality.  A 2-D echocardiogram showed  overall left ventricle systolic function was moderately decreased with  an EF of 40-45%.      [name], M.D.  Electronically Signed     NJ/MEDQ  D:  03/25/2007  T:  03/25/2007  Job:  [id number]   cc:   [name] Daleen Squibb, MD, [facility]  [name], M.D.

## 2010-10-01 NOTE — Assessment & Plan Note (Signed)
[facility]                         ELECTROPHYSIOLOGY OFFICE NOTE   [name], [name]                     MRN:          [id number]  DATE:10/13/2007                            DOB:          07-19-1955    [name] returns today for follow-up.  She is a very pleasant woman  with a history of unexplained syncope who is status post insertion of an  implantable loop recorder.  The patient was seen in the hospital back in  May with an episode of syncope, and the workup today has been  unremarkable.  She has recently undergone MRI of the brain.  She has  problems with lower back and back pain.   Her past medical history is notable for:  1. Hypertension.  2. Dyslipidemia.  3. Diabetes.  4. She has recently lost over 100 pounds.  5. She has LV dysfunction with an EF of 40% and negative EP study in      the past.  6. She has reflux disease.   MEDICATIONS:  Included:  1. Isosorbide 30 mg half tablet daily.  2. Lyrica 75 twice daily.  3. Lisinopril 10 daily.  4. Carvedilol 3.125 twice daily.  5. Metformin 500 twice daily.  6. Zocor 20 a day.  7. Aggrenox 25 twice daily.   On physical exam, she is a pleasant, middle-aged woman in no distress.  Blood pressure was 98/67, pulse 98 and regular, respirations 18, weight  was 209 pounds.  HEENT:  Normocephalic and atraumatic.  Pupils equal and round.  Oropharynx moist.  Sclerae were anicteric.  NECK:  Revealed no jugular venous distention.  LUNGS:  Were clear bilaterally to auscultation.  No wheezes, rales or  rhonchi were present.  No increased work of breathing.  CARDIOVASCULAR EXAM:  Revealed a regular rate and rhythm.  Normal S1-S2.  There were no murmurs, rubs, or gallops.  EXTREMITIES:  Demonstrated 1+ peripheral edema bilaterally.   IMPRESSION:  1. Unexplained syncope.  2. Status post implantable loop recorder with no brady or tachycardia      episodes.  3. Hypertension.  4. Diabetes.  5.  Obesity.   DISCUSSION:  The etiology of the patient's syncope is still unclear to  me.  I suspect that she has problems with orthostasis, though this has  not been documented for certain.  I recommended that we today  discontinue her isosorbide because her blood pressure today is on the  low side and will follow her back up in several weeks to a month.     [name]. [name] Ridgel, MD  Electronically Signed    GWT/MedQ  DD: 10/13/2007  DT: 10/13/2007  Job #: [id number]

## 2010-10-01 NOTE — Discharge Summary (Signed)
[name], [name]              ACCOUNT NO.:  [id number]   MEDICAL RECORD NO.:  [id number]          PATIENT TYPE:  INP   LOCATION:  4703                         FACILITY:  [facility]   PHYSICIAN:  [name], M.D.  DATE OF BIRTH:  09/29/1955   DATE OF ADMISSION:  04/27/2007  DATE OF DISCHARGE:  05/01/2007                               DISCHARGE SUMMARY   PRIMARY CARE PHYSICIAN:  Dr. Lonia [name].   DISCHARGE DIAGNOSES:  1. Syncope, likely secondary to orthostasis.  2. Cerebrovascular accident right parietal region.  3. Hypertension.  4. Coronary artery disease.  5. Type 2 diabetes mellitus.  6. Dyslipidemia.  7. Gastroesophageal reflux disease.  8. Chronic constipation.  9. Smoking history.  10.Abdominal pain, nonspecific.  11.Peripheral neuropathy.   DISCHARGE MEDICATIONS:  1. Isosorbide mononitrate (Imdur) 15 mg p.o. daily.  2. Zocor 20 mg p.o. q.h.s.  3. Amitiza 24 mcg p.o. daily.  4. Prevacid 30 mg p.o. daily.  5. Coreg 3.125 mg p.o. b.i.d.  6. Amaryl 4 mg p.o. b.i.d. (was on 4 mg p.o. daily).  7. Aggrenox 1 p.o. b.i.d.  8. Glucophage 500 mg p.o. b.i.d.  9. Neurontin 300 mg p.o. b.i.d.   Note:  The patient has been recommended to discontinue Aspirin and  Hydrochlorothiazide.   PROCEDURE:  1. X-ray cervical lumbar spine and hip dated April 27, 2007 that      showed degenerative cervical spondylosis with degenerative disk      disease and degenerative facet disease, no acute bony findings.      Advanced facet disease at L4-5 and L5-S1 with mild degenerative      spondylolisthesis of L4, no acute bony findings. Sacroiliac joint      degenerative changes, aortic calcifications without definite      aneurysm.  2. Abdominal/pelvic CT scan dated April 28, 2007. This showed no      acute abnormality. There was a left adrenal adenoma. Also colonic      diverticulosis without evidence of diverticulitis, small umbilical      hernia containing fat.  3. Brain MRI  dated April 28, 2007 which showed questionable      punctate subacute ischemia in the right parietal region, mild      nonspecific subcortical white matter changes supratentorially which      may represent sequelae, no small vessel disease, mild mucosal      thickening of the ethmoids.  4. Brain MRA dated April 28, 2007 that showed no flow signal in the      right vertebrobasilar junction region clearly secondary to proximal      occlusion versus severe slow flow due to stenosis. Suspicion of      possible significant stenosis of hemodynamic significance involving      the left vertebrobasilar junction distal to the left TICA. Repeat      MRA examination recommended.  5. MRA dated April 20, 2007. This showed bovine arch variant, mild      carotid arteriosclerosis but no stenosis, dominant left vertebral      artery within normal limits, nondominant right vertebral artery,  with possible stenosed segment or alternately there may be very      diminutive ramification into muscular or other branches.  6. 2-D echocardiogram dated April 29, 2007. This shows overall      mildly decreased left ventricular systolic function. EF 40% with a      range of 35-45%, mild diffuse left ventricular hypokinesis, left      ventricular wall thickness mildly increased. The other parameters      were consistent with abnormal left ventricular relaxation. Aortic      valve thickness was mildly increased. There appeared to be a      trivial peritoneal effusion posterior to the heart. There was a      small left pleural effusion.  7. Lower extremity venous Doppler dated April 28, 2007. This showed      no evidence of DVT, superficial thrombosis or Baker cyst.  8. EEG dated April 30, 2007. This was within normal limits. No      definite epileptiform details were noted.   CONSULTATIONS:  1. [name] A. Orlin Hilding, MD, neurologist.  2. [name] Friar, MD, gastroenterologist.   ADMISSION  HISTORY:  As per H&P notes of April 27, 2007, dictated by  Dr. Michaelyn [name]. However, in brief, this is a 55 year old female,  with known history of previous presyncope March 21, 2007, chronic  constipation, hypertension, type 2 diabetes mellitus, dyslipidemia,  GERD, coronary artery disease, cataracts, smoking history, who presented  for a syncopal episode which occurred at work, after she had just  finished urinating and was on her way back to her desk. There were no  warning symptoms, no fecal or urinary incontinence and no observed  involuntary movements. She was admitted for further evaluation,  investigation and management.   CLINICAL COURSE:  1. Syncopal episode.  The patient has a prior history of presyncope in      March 21, 2007 at which time she had undergone a thorough workup      which proved to be unremarkable. On this occasion, it is felt that      this may be due to orthostasis against the background of beta      blocker, nitrate and diuretic treatment. She was monitored      telemetrically. Serial cardiac enzymes were done and were not      elevated. There were no acute ischemic changes on the EKG. She was      noted however to have orthostasis on initial evaluation in the      emergency department with [name] pressure dropping as low as 86/42      mmHg. She was managed with intravenous infusion of normal saline,      and diuretics were held. By April 20, 2007, orthostasis had      resolved. We were thus able to discontinue intravenous fluids and      no further recurrences of either presyncope or syncope were      recorded throughout the rest of the patient's hospitalization.   1. CVA.  As part of workup for #1 above, brain MRI/MRA were done and      Dr. Marcelino [name] called for neurology consultation. Imaging      studies demonstrated a miniscule right parietal infarct. It was      felt that this was not related to syncope and she was recommended       to be commenced on Aggrenox.  Certainly  no objective neurologic      deficit  was noted.   1. Smoking history.  The patient has been counseled appropriately and      strongly recommended to quit smoking as part of risk factor      modification.   1. Hypertension.  The patient's [name] pressure started creeping up      following discontinuation of intravenous fluids. We were thus able      to recommence her Imdur and Coreg in pre-admission doses.      Hydrochlorothiazide has however been held, as this may be      contributory to dehydration and orthostasis.   1. Coronary artery disease.  There were no symptoms referable to this,      during the course of the patient's hospitalization.   1. Type 2 diabetes mellitus.  This was managed with a combination of      Amaryl, Glucophage and carbohydrate modified diet during the course      of the patient's hospitalization, and CBG's remained reasonably      controlled. HbA1c was 8.3. Further improvement is desirable.   1. Dyslipidemia. The patient's lipid profile was as follows. Total      cholesterol 148, triglycerides 155, HDL 28, LDL 89. The patient      continues on pre-admission dose of statin.   1. Abdominal pain.  The patient complained of some epigastric pain      during the course of her hospitalization and also some difficulty      in swallowing. This necessitated calling a GI consultation which      was kindly provided by Dr. Charlott Rakes. Upper GI series were      done on May 01, 2007 and this showed a normal stomach, minimal      spasm of esophagus, no hiatal hernia, mild spontaneous reflux. The      patient continues on proton pump inhibitor and has been reassured      as to the absence of severe acute pathology. Per GI, no EGD is      indicated at this time. The patient will followup with      gastroenterologist on an outpatient basis. Abdominal/pelvic CT scan      were unremarkable for acute pathology.   1.  Peripheral neuropathy.  Due to complaints of leg pains, bilateral      lower extremity dopplers were carried out, and showed no evidence      of DVT or Baker`s cyst. The culprit is likely diabetic neuropathy.      Patient has been commenced on Neurontin.   DISPOSITION:  The patient was on May 02, 2007 deemed sufficiently  clinically recovered and stable to be discharged. She has therefore been  discharged accordingly. She is recommended to return to regular duties  on May 16, 2007, or otherwise as indicated by her primary MD.   DIET:  Heart healthy/carbohydrate modified.   ACTIVITY:  Recommended to increase activity slowly.   FOLLOWUP:  The patient is to return to her primary MD, Dr. Lonia [name],  within one week of discharge. She is also to followup with either Dr.  Orlin [name] or Dr. Delia Heady, [facility], at date to  be determined. Also she is to followup with Dr. Charlott Rakes,  gastroenterologist, at a date to be determined. All of this has been  communicated to the patient and she has verbalized understanding.      [name], M.D.  Electronically Signed     CO/MEDQ  D:  05/02/2007  T:  05/02/2007  Job:  [id number]   cc:   [name], M.D.  [name], M.D.  [name] Friar, MD

## 2010-10-01 NOTE — Consult Note (Signed)
[name]              ACCOUNT NO.:  [id number]   MEDICAL RECORD NO.:  [id number]          PATIENT TYPE:  INP   LOCATION:  3733                         FACILITY:  [facility]   PHYSICIAN:  [name] Sans. Wall, MD, FACCDATE OF BIRTH:  1955-08-03   DATE OF CONSULTATION:  03/23/2007  DATE OF DISCHARGE:                                 CONSULTATION   PRIMARY CARDIOLOGIST:  New to [facility], seeing Dr. Daleen [name].   PRIMARY CARE [name]:  Dr. Mikeal Hawthorne.   PATIENT PROFILE:  A 55 year old African American female without prior  history of CAD, with multiple coronary risk factors, who presents with  chest pain.   PROBLEM LIST:  1. Chest pain.  2. Hypertension.  3. Hyperlipidemia.  4. Type 2 diabetes mellitus.  5. Obesity.  6. Ongoing tobacco abuse.  7. Gastroesophageal reflux disease.  8. History of cataracts.   HISTORY OF PRESENT ILLNESS:  A 55 year old African American female  without prior history of coronary artery disease.  She has a several  month history of dyspnea on exertion, which was noted doing house work  and also the wash.  She also has a several month history of:  1.  Scapular and substernal chest discomfort, associated with positioning  after sitting for long periods of time and reproducible with rotation of  her torso.  2.  Reproducible epigastric pain and tenderness, associated  with constipation.  She also has a 3 to 5 day history of intermittent  left shoulder, stabbing and shooting pain.  She has had some complaints  of lightheadedness with headache over the last 2 days.  This morning,  while driving, she noted 6/38 substernal chest pressure and heaviness  that is not different from the other types of chest pain, associated  with shortness of breath, diaphoresis and mild nausea.  Symptoms  persisted for approximately 15 minutes while driving and she drove  herself to the [facility] ED.  While in the parking lot, symptoms  resolved spontaneously.  She was  admitted by the incompass service and  the first set of cardiac markers were negative.  EKG shows no acute  changes and currently she is pain free.   ALLERGIES:  PENICILLIN.   HOME MEDICATIONS:  1. Amaryl 4 mg b.i.d.  2. Metformin 1000 mg b.i.d.  3. Nexium 40 mg daily.  4. Lisinopril 10 mg daily.  5. HCTZ 25 mg daily.  6. Simvastatin 20 mg q.h.s.   FAMILY HISTORY:  Mother died at age 17 from pneumonia, end-stage renal  disease, gallbladder cancer and CHF.  Father is alive at age 37 with a  history of diabetes and coronary artery disease.  She has 1 brother and  2 sisters.  There is no history of stroke, diabetes or coronary artery  disease in her siblings.  One sister has a colostomy.   SOCIAL HISTORY:  She lives in [place] by herself.  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  She is single.  She has a  30 pack year history of tobacco abuse, currently smoking 1 pack per  week.  She  has 2 to 3 beers on Sundays while watching football.  She  denies any drug use and does not routinely exercise.   REVIEW OF SYSTEMS:  She thinks that she had a fever this morning, but  this resolved.  She has had greater than a 100 pound weight loss over  the past couple of years, which was intentional.  She has had headaches  and lightheadedness for the past 2 days or so.  She has chest pain as  outlined in the HPI, as well as shortness of breath and dyspnea on  exertion with house chores over the past couple of months.  She had  nausea this morning and also experiences this whenever she is  constipated.  She is diabetic.  All other systems reviewed and are  negative.   PHYSICAL EXAMINATION:  VITAL SIGNS:  Temperature 97.7.  Heart rate 96.  Respirations 20.  Blood pressure 130/83.  Pulse oximetry 100% on room  air.  Weight is 97.7 kilograms.  GENERAL:  A pleasant African American female in no acute distress,  awake, alert and oriented x3.  NECK:  No bruits or JVD.  LUNGS:   Respirations regular and unlabored.  Clear to auscultation.  CARDIAC:  Regular S1, S2.  No S3, S4 or murmurs.  ABDOMEN:  Round, soft, nontender, nondistended.  Bowel sounds present  x4.  EXTREMITIES:  Warm, dry and pink.  No clubbing, cyanosis or edema.  Dorsalis pedis and posterior tibial pulses 2+ and equal bilaterally.  No  femoral bruits are noted.   ACCESSORY CLINICAL FINDINGS:  Chest x-ray shows no acute cardiopulmonary  disease.  EKG shows sinus rhythm with a rate of 69 beats per minute.  No  acute ST or T changes.  Hemoglobin 14.6, hematocrit 43.4, WBC 11.6,  platelets 273.  Sodium 134, potassium 4.4, chloride 97, CO2 24, BUN 18,  creatinine 0.82, glucose 191, total bilirubin 0.6, alkaline phosphatase  143, AST 19, ALT 23, total protein 7.1, albumin 3.7, lipase 34.  CK 44,  MB 1.1, Troponin-I 0.03.   ASSESSMENT/PLAN:  1. Chest pain.  The patient has several different types of pain, but      was most concerned about the pressure and heaviness with associated      shortness of breath, nausea and diaphoresis that occurred this      morning, prompting her ER visit and subsequent admission.  She also      has a 7 month history of dyspnea on exertion with house chores and      has multiple risk factors for coronary artery disease, including      hypertension, hyperlipidemia, tobacco abuse and obesity.  Plan      cardiac catheter in the a.m. to rule out obstructive disease.  The      patient is agreeable.  Continue aspirin, statin, ACE inhibitor and      Lovenox.  Would add a nitrate and consider beta-blocker.  2. Hypertension.  Slightly elevated.  Continue ACE inhibitor and      diuretic.  Consider beta-blockers (Coreg) with heart rate 90s to      10 0.  3. Hyperlipidemia.  Check lipids.  Liver function tests were okay.      Continue simvastatin.  4. Diabetes mellitus per primary team.  We will hold metformin in      preparation for catheterization.  5. Ongoing tobacco abuse.   Complete cessation advice.  She has been      seen by the cessation team.  6. Lightheadedness.  This has been going on for about 2 days.  She      said she has been unsteady on her feet because of it.  We will      continue to watch telemetry for the brady attack arrhythmias.  We      will be able to evaluate left ventricular function with      catheterization and consider outpatient monitoring.  7. Gastroesophageal reflux disease.  Continue proton pump inhibitors.  8. Obesity.  She has lost a great deal of weight intentionally, but      she is to continue to become more active and continue to loose      weight.      [name], ANP      [name] Sans. Daleen Squibb, MD, [facility]  Electronically Signed    CB/MEDQ  D:  03/23/2007  T:  03/23/2007  Job:  [id number]

## 2010-10-01 NOTE — Assessment & Plan Note (Signed)
[facility]                                 ON-CALL NOTE   LINN, CLAVIN                       MRN:          [id number]  DATE:04/26/2007                            DOB:          [birth year]    PRIMARY CARDIOLOGIST:  Dr. Valera Castle.   Ms. Montville called in this evening secondary to complaints of left arm  pain with paresthesias in the left hand, as well as sharp and shooting  chest pains in and out through her left breast.  She was evaluated in  the ER December 5 for similar complaints along with dizziness.  She also  notes some dizziness tonight.  She is asking what she should do, and I  advised her that the only way to evaluate her would be for her to come  in to the emergency room for EKG and lab work and additional evaluation  if necessary.  She said she would like to try and avoid the emergency  room, and if her symptoms worsen then she will come in.      [name], ANP  Electronically Signed      Jesse Sans. Daleen Squibb, MD, [facility]  Electronically Signed   CB/MedQ  DD: 04/26/2007  DT: 04/27/2007  Job #: [id number]

## 2010-10-01 NOTE — Op Note (Signed)
[name], [name]              ACCOUNT NO.:  [id number]   MEDICAL RECORD NO.:  [id number]          PATIENT TYPE:  INP   LOCATION:  5502                         FACILITY:  [facility]   PHYSICIAN:  Doylene Canning. Ladona Ridgel, MD    DATE OF BIRTH:  08-31-1955   DATE OF PROCEDURE:  05/31/2007  DATE OF DISCHARGE:                               OPERATIVE REPORT   PROCEDURE PERFORMED:  Invasive electrophysiologic study followed by  insertion of an implantable loop recorder.   INTRODUCTION:  The patient is a very pleasant 55 year old woman who was  admitted to the hospital with syncope.  She was subsequently found to  have no myocardial infarction and no arrhythmias noted.  She has known  LV dysfunction with an EF that has varied between 35 and 45% in the  past, this time thought to be approximately 40%.  She has coronary  disease but it is thought to be out of proportion to her LV dysfunction.  She is now referred for invasive EP study.  If the EP study were notable  for inducible VT, she would receive an ICD and if her EP study was  negative, she would undergo insertion of an implantable loop recorder  secondary to recurrent syncope.   DESCRIPTION OF PROCEDURE:  After informed consent was obtained, the  patient is taken to the diagnostic EP lab in the fasting state.  After  the usual preparation and draping, intravenous fentanyl and midazolam  was given for sedation.  A 5-French quadripolar catheter was inserted  percutaneously in the right femoral vein and advanced under  fluoroscopic guidance to the right ventricle.  A 5-French quadripolar  catheter was inserted percutaneously in the right femoral vein and  advanced to the Hiss bundle region under fluoroscopic guidance.  A 5-  French quadripolar catheter was inserted percutaneously in the right  femoral vein and advanced to the right atrium under fluoroscopic  guidance.  After measurement of the basic intervals, rapid ventricular  pacing was  carried out from the RV apex demonstrating VA dissociation.  The pacing cycle length was stepwise decreased down to 270 milliseconds  but there was no inducible VT.  Next programmed ventricular stimulation  was carried out from the RV apex at base drive cycle lengths of 161 as  well as 400 milliseconds.  S1, S2; S1, S2, S3, and S1, S2, S3, S4  stimuli were delivered with S1, S2; S2, S3 and S3, S4 interval stepwise  decreased down to ventricular refractoriness.  During programmed  ventricular stimulation, there was no inducible VT or SVT noted.  Next  rapid atrial pacing was carried out from the high right atrium at a  pacing cycle length of 600 msec and stepwise decreased down to 530 msec  where AV Wenckebach was observed.  During rapid atrial pacing,  the PR  interval was less than the RR interval and there was no inducible SVT.  Next programmed atrial stimulation was carried out from the high right  atrium at a base drive cycle length of 096 milliseconds.  The S1, S2  interval was stepwise decreased down to  460 milliseconds where the AV  node ERP was observed.  During probing atrial stimulation, there were no  AH jumps and no echo beats noted. There no inducible SVT.  At this  point, the catheter was removed, hemostasis was assured and the patient  was prepped for insertion of an implantable loop recorder.   After the usual preparation and draping, 30 mL of lidocaine was  infiltrated into the left pectoral region. A 3 cm incision was carried  out over this region and electrocautery was utilized to dissect down to  the fascial plane. A subcutaneous pocket was made with blunt dissection  and electrocautery. Electrocautery was utilized to assure hemostasis.  The Medtronic Reveal DX model 9528 implantable loop recorder, serial  [id number] was placed in the subcutaneous pocket and secured with silk  suture. Additional Kanamycin was utilized to irrigate the pocket and the  incision was  closed with a layer of 2-0 Vicryl followed by a layer of 3-  0 Vicryl. Betadine was painted on the skin, Steri-Strips were applied, a  pressure dressing was placed and the patient was returned to her room in  satisfactory condition.   COMPLICATIONS:  There were no immediate procedure complications.   RESULTS:  This demonstrates successful implantation of a Medtronic  implantable loop recorder after negative electrophysiologic testing  revealing a cause of her syncope.      Doylene Canning. Ladona Ridgel, MD  Electronically Signed     GWT/MEDQ  D:  05/31/2007  T:  05/31/2007  Job:  [id number]   cc:   Hillery Aldo, M.D.  [name], M.D.

## 2010-10-01 NOTE — H&P (Signed)
NAME:  [name], [name] NO.:  [id number]   MEDICAL RECORD NO.:  [id number]          PATIENT TYPE:  INP   LOCATION:  4715                         FACILITY:  [facility]   PHYSICIAN:  Rollene Rotunda, MD, FACCDATE OF BIRTH:  [birth year]   DATE OF ADMISSION:  09/20/2007  DATE OF DISCHARGE:                              HISTORY & PHYSICAL   SUMMARY OF THE HISTORY:  [name] is a 55 year old African American  female who is transported via EMS to [facility] secondary to  syncope.  [name] states that over the last several days she has felt  intermittent wooziness and dizziness.  This usually occurs midday and  lasts less than an hour, better with rest, and seems to be worse after  activity.  She also feels that it is positional, and she has noticed  occasional blurred vision with this.  She denies objects spinning.  Initially, she felt fine this morning.  She did have an episode of  wooziness at about 8:30 that lasted less than 10 minutes while at work.  However, around 10 o'clock, she went outside to smoke her daily half a  cigarette, and she again noted dizziness.  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  The next thing she  remembers is the supervisor patting her hand and she was lying on the  floor.  She felt nauseated with a frontal headache.  This episode was  witnessed by [name], a coworker.  I spoke with her on the phone  with the patient's permission, and she states that she observed the  patient stumbling out of the elevator and leaning against the wall at  which point she slid down the wall and lost consciousness.  The patient  did not respond to voice.  [name] did not observe any breathing  difficulties, seizure activity, bowel or urine incontinence.  The  employee nurse [name] approached at this point.  It was  approximately 5 minutes after the initial event.  The nurse noted her  lying on the  floor without breathing difficulties.  She checked her  blood pressure, and this was 180/105.  She noted that over the next 10  minutes, the patient would wake up and be very groggy and confused.  She  complained of nausea and vomited twice but then she would become  unresponsive; this occurred twice.  EMS arrived approximately 20 minutes  later.  The patient states she remembers EMS's arrival as well, and she  recalls her glucose being 159.  Her EMS report is not available.   PAST MEDICAL HISTORY:  Not available.   ALLERGY:  PENICILLIN.   MEDICATIONS PRIOR TO ADMISSION:  1. Vicodin p.r.n., unknown dosage.  2. Metformin 500 mg b.i.d.  3. Coreg 3.125 mg b.i.d.  4. Lisinopril 10 mg daily.  5. Glimepiride 4 mg daily.  6. Lyrica 75 b.i.d.  7. Aggrenox 25 b.i.d.  8. Imdur 30 half a tablet daily.  9. Simvastatin 20 daily.   She has a history of hypertension.  This is her approximately fifth  episode of syncope since November 2008.  She had an negative EP study on  May 31, 2007, with a loop recorder on May 31, 2007.  She has a  history of hyperlipidemia and a history of diabetes associated with  peripheral neuropathy.  She states her sugars usually run in the 140s at  home.  She has a history of coronary artery disease.  Last  catheterization on March 24, 2007, showed an EF at 35% to 40%; 40%  proximal, 30% to 40% mid LAD, 50/50% diagonal 1, 50% ramus, 70%/80% and  90% ramus branch, 75% to 80% OM, 50% to 60% circumflex, 70% mid RCA and  calcifications throughout.  Her last echocardiogram on April 28, 2007, showed an EF of 40%, diastolic dysfunction.  She has had an  extensive workup for adrenal adenoma which has also been unremarkable  and a history of cerebrovascular disease with a negative EEG by  neurology.  She has had some abnormal MRIs indicative of stroke.  She  has also had extensive evaluation for greater than 100-pound weight loss  over the last year; this has  not shown any evident malignancy.   SOCIAL HISTORY:  She resides in [place] with her father.  She is  single without children.  She works with an [employer] engineer, contracting.  She is down from one and a half  packs of cigarettes a day for the last 40 years to a half a cigarette a  day.  She drinks 2 to 3 beers on the weekend.  She denies drugs or  herbal medications.  Exercise: She tries to follow a healthy diet.   FAMILY HISTORY:  Her mother died at the age of 12 with pneumonia.  She  had a history of ESRD, CHF, and gallbladder cancer.  Father is 32 with a  history of diabetes and coronary artery disease.  She has one brother  and two sisters, as far she knows pretty healthy.   REVIEW OF SYSTEMS:  Review of systems in addition to the above is  notable for chronic headaches; sinus problems; reading glasses;  nocturia; postmenopausal; numbness in both feet; arthralgias in her feet  and leg for which she was recently prescribed Vicodin, however she does  not take this on a regular basis; chronic GERD; and constipation.  The  patient also describes a history of chest discomfort may be 1 to 2 times  a week, lasting less than 15 minutes, sometimes associated with slight  shortness of breath.  She describes it as an ache; it can occur any  time, even nocturnally.  She is not specific about aggravating or  alleviating factors; however, she does state that it is worse with  twisting, turning, and touch.   PHYSICAL EXAMINATION:  GENERAL:  A well-nourished, well-developed,  pleasant, African American female in no apparent distress.  VITAL SIGNS:  On arrival, blood pressure was 170/80, respirations 18,  pulse 72, temperature 96, and 98% saturations on room air.  ER did  orthostatics, and there was not any significant change.  HEENT:  Unremarkable.  NECK: Supple without thyromegaly, adenopathy, JVD, or carotid bruits.  HEART:  PMI is not displaced.  Regular rate and  rhythm.  I do not  appreciate any murmurs, rubs, clicks or gallops.  All pulses are  symmetrical and intact without abdominal or femoral bruits.  LUNGS:  Clear to auscultation.  SKIN:  Skin does not show any breaks, without rashes or lesions.  ABDOMEN:  Rounded.  Bowel sounds are present without organomegaly,  masses, or tenderness.  EXTREMITIES:  Negative cyanosis, clubbing, or edema.  MUSCULOSKELETAL/NEUROLOGIC:  Essentially unremarkable.   IMAGING DATA:  Chest x-ray shows no active disease.  EKG shows normal  sinus rhythm, normal axis, PVC and PAC, baseline artifact.  H and H are  12.9 and 38.8, platelets were felt to be adequate but not quantified.  WBCs 8.3, normal indices.  I-STAT showed a sodium of 140, potassium 4.0,  BUN 12, creatinine 0.7, and glucose 145.  Point of care marker was  negative x1.   IMPRESSION:  1. Syncope of uncertain etiology.  Loop recorder was activated, and it      showed normal sinus rhythm.  There was also no auto trigger advance      associated with this.  Witness noted adequate breathing and      hypertensive with slightly elevated glucose per EMS.  2. Hypertension.  3. Atypical chest discomfort with a history of coronary artery      disease, continuing medical treatment.  4. Chronic lower extremity discomfort of unspecific origin.  It is      noted ABIs were obtained in the office on September 06, 2007, showed a      right ABI of 1.1 and a left ABI of 1.1.   DISPOSITION:  We will admit the patient to the hospital for further  evaluation.  We will ask neurology for a consultation as the patient was  supposed to follow up with neurology but has not done so.  We will at  least keep her overnight on telemetry to further evaluate rhythm  disturbances and rule out myocardial infarction.  We will continue her  on her home medications.      Joellyn Rued, PA-C      Rollene Rotunda, MD, [facility]  Electronically Signed    EW/MEDQ  D:  09/20/2007  T:   09/21/2007  Job:  [id number]   cc:   [name], M.D.  [name], M.D.  [name] Daleen Squibb, MD, [facility]  Duke Salvia, MD, [facility]

## 2010-10-01 NOTE — Assessment & Plan Note (Signed)
[facility]                            CARDIOLOGY OFFICE NOTE   [name], [name]                     MRN:          [id number]  DATE:04/12/2007                            DOB:          [birth year]    PRIMARY CARDIOLOGIST:  [name], MD, [facility]   REASON FOR VISIT:  Urgent add on to clinic with near syncope.   HISTORY OF PRESENT ILLNESS:  [name] is a 55 year old woman with a  history of type 2 diabetes mellitus, hypertension, hyperlipidemia,  obesity and gastroesophageal reflux disease who was seen in the hospital  in consultation by Dr. Daleen Squibb during admission earlier this month.  She  was assessed for chest pain at that time and ultimately underwent a  cardiac catheterization by Dr. Riley Kill on March 24, 2007.  This study  revealed fairly diffuse coronary artery disease including sequential 70%  and 80% stenosis within the proximal right coronary artery, 70-80%  stenosis within the marginal intermediate system and mild to moderate  disease within the left anterior descending.  Her left ventricular  ejection fraction by echocardiography was in the 40-45% range.  It was  ultimately elected that she be treated with aggressive medical therapy  and followed back in the clinic.  She was scheduled to see Dr. Daleen Squibb in  clinic earlier this morning, however, missed her scheduled visit.   [name] states that this morning at about 2:30, she was having  significant problems with constipation.  She was having some pain in  her left lower quadrant and inability to move her bowels and ultimately  came into the emergency department at [facility] this morning for  further assessment, which was the reason that she missed her visit this  morning with Dr. Daleen Squibb.  She states that she had a rectal examination and  ultimately a disimpaction and was feeling better when she left.  She  states that she then came over to our office to reschedule her  appointment  with Dr. Daleen Squibb, and when she was called up to the desk after  standing began to feel very dizzy.  She had to sit down and a code  orange was called with further medical evaluation made at that time.  She was not noted to have any syncope and was taken back for further  examination.   On physical examination she was not complaining of any active chest  pain.  Her electrocardiogram shows sinus tachycardia at 120 beats per  minute with nonspecific ST-T wave changes.  Her manual blood pressure  readings were 95/78 in the right arm and 95/72 in the left arm with a  heart rate maintaining in the 120s.  Finger stick blood sugar was 270,  and her oxygen saturation on room air was 97%.  Other than the  aforementioned symptoms, [name] does state that she had some  spasms in her legs last night.   ALLERGIES:  PENICILLIN.   MEDICATIONS:  As of her discharge summary:  1. Amaryl 4 mg p.o. b.i.d.  2. Metformin 1000 mg p.o. b.i.d.  3. Nexium 40 mg p.o.  daily.  4. Lisinopril 10 mg p.o. daily.  5. Hydrochlorothiazide 25 mg p.o. daily.  6. Simvastatin 20 mg p.o. q.h.s.  7. Aspirin 81 mg p.o. daily.  8. Imdur 50 mg p.o. daily.  9. Fish oil supplements 2 g daily.  10.Coreg 3.125 mg p.o. b.i.d.   The patient states that she did not have anything to eat this morning  and did not take her medications.   PAST MEDICAL HISTORY:  As outlined above.   SOCIAL/FAMILY HISTORY:  Reviewed in the recent History and Physical as  well as Consultation Note from earlier November.  These were reviewed.  The patient lives alone in [place].  She drove herself to the office  today.  She has a pack per week tobacco use history that is ongoing.   PHYSICAL EXAMINATION:  VITAL SIGNS:  Blood pressure 95/72 in the left  arm, heart rate 120, oxygen saturation 97%, respirations 20 and  unlabored.  GENERAL:  This is an obese woman lying supine, denying any active chest  pain.  HEENT:  Conjunctivae, lids normal,  oropharynx clear.  NECK:  Supple.  No elevated jugular venous distention.  No loud bruits.  LUNGS:  Generally clear with diminished breath sounds.  CARDIAC:  Somewhat distant heart sounds, rapid rhythm.  No obvious S3.  ABDOMEN:  Bowel sounds are present, but diminished, mildly tender but no  guarding.  EXTREMITIES:  No pitting edema.  No obvious cords are noted.  Distal  pulses are diminished.  SKIN:  Warm and dry.  MUSCULOSKELETAL:  No kyphosis is noted.  NEUROPSYCHIATRIC:  The patient is alert and oriented x3.   IMPRESSION/RECOMMENDATIONS:  1. Episode of near syncope, now associated with resting sinus      tachycardia and relative hypotension.  It is not entirely clear as      to the etiology of these signs/symptoms at present.  Multiple      possibilities should be considered.  The patient did recently      present with complaints of constipation and left lower quadrant      pain and had a disimpaction in the emergency department by report.      It is possible that she has a gastrointestinal etiology.  She      stated that she had no blood work obtained, and I would at least      evaluate for the possibility of infection, hemorrhage or other      volume contraction.  She described some lower extremity pain last      night raising the possibility of thromboembolic disease, although,      at the present time she is not hypoxic.  Would exclude pulmonary      embolus.  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  Her electrocardiogram shows nonspecific ST      changes.  Evaluation of cardiac markers would also be reasonable.      Our plan at this point is to place a peripheral IV and give the      patient normal saline.  She will be transported back to the      emergency department via EMS for further evaluation.  I explained      this to her      and she is in agreement.  We  plan to contact her sister to let her  know the circumstances.  2. Further plans can follow pending her evaluation in the emergency      department.     Jonelle Sidle, MD  Electronically Signed    SGM/MedQ  DD: 04/12/2007  DT: 04/12/2007  Job #: [id number]   cc:   [name] Wall, MD, [facility]

## 2010-10-01 NOTE — Assessment & Plan Note (Signed)
[facility]                                 ON-CALL NOTE   FRANCIES, INCH                     MRN:          [id number]  DATE:06/08/2007                            DOB:          May 25, 1955    Primary Cardiologist:  Dr. Sherryl Manges   I received a phone call from Ms. Newingham concerning an incision site for  which she had had an implantable cardiac loop recorder which was placed  by Dr. Graciela Husbands on May 31, 2007.  The patient has a history of syncopal  episodes and did have an EP study, which ruled out SVT and V-tach  induced arrhythmia.  However, secondary to her syncopal episodes, an  implantable loop recorder was placed.   Patient states that the site of her loop recorder is swollen and  painful.  It is warm to touch and she called to report this.  I have  advised the patient to come to the emergency room to be evaluated for  infective process in the incision site, possible admission and removal  of the recorder if necessary.  The patient verbalized understanding and  stated that she would come to the emergency room to be evaluated.      Bettey Mare. Lyman Bishop, NP  Electronically Signed      Gerrit Friends. Dietrich Pates, MD, [facility]  Electronically Signed   KML/MedQ  DD: 06/08/2007  DT: 06/08/2007  Job #: [id number]

## 2010-10-01 NOTE — H&P (Signed)
NAME:  [name], [name]:  [id number]   MEDICAL RECORD NO.:  [id number]          PATIENT TYPE:  INP   LOCATION:  5502                         FACILITY:  [facility]   PHYSICIAN:  Luis Abed, MD, FACCDATE OF BIRTH:  1955-06-10   DATE OF ADMISSION:  05/26/2007  DATE OF DISCHARGE:                              HISTORY & PHYSICAL   PRIMARY CARDIOLOGIST:  Maisie Fus C. Wall, MD   PRIMARY CARE:  [name], MD   GI:  Anselmo Rod, MD   We were asked to consult on [name] by the [employer] team.  Ms.  [name] is a very pleasant 55 year old African American female with a  history of syncopal episodes x2.  The first episode in November 2008 was  vasovagal response secondary to a stool impaction.  Second episode in  November 2008 was secondary to TIA per neurology.  Today [name]  states she felt her usual self, got up and went to work, was sitting at  her computer this morning.  She states the screen began to get hazy.  She put her head down.  When she sat up the room began spinning.  A  coworker noticed.  [name] states she felt the need to urinate and  stood up to go to the bathroom with assistance.  She states she became  very dizzy.  She fell back against the wall and coworker grabbed a chair  and she sat down.  She states she had a sudden headache, blood pressure  was checked by coworker at 138/76 per patient's report.  She also  complains of a stabbing pain in her chest without palpitations.  She  experienced some numbness and tingling in her feet and fingers.  She  felt the urge to urinate but could not.  She continued to have the  intermittent dull ache over her left breast.  At that point EMS was  called.  A 12-lead EKG by EMS showing no acute ST or T-wave changes.  [name] states she has had this dull ache over her left breast x1  week, intermittent, lasts anywhere from 1-3 minutes, not associated with  any other symptoms.   ALLERGIES:   PENICILLIN.   MEDICATIONS:  1. Aggrenox one b.i.d.  2. Prevacid 30 mg.  3. Amaryl 4 mg b.i.d.  4. Metformin 500 mg b.i.d.  5. Neurontin 300 mg b.i.d.  6. Zocor 20 mg.  7. Coreg 3.125 mg b.i.d.  8. Amitiza 24 mcg p.o. daily.   PAST MEDICAL HISTORY:  1. Syncope x2.  2. CVA December 2008.  3. Hypertension.  4. Diabetes type 2.  5. Peripheral neuropathy.  6. Dyslipidemia.  7. GERD.  8. Hiatal hernia.  9. Catheterization in November 2008 showed diffuse three-vessel      disease, recommended medical management at that time.  10.EEG December 2008 was interpreted as normal.  11.Echocardiogram done April 29, 2007, EF 40% with diffuse left      ventricular hypokinesis.  12.Irritable bowel syndrome.  13.Constipation.   SOCIAL HISTORY:  The patient lives in [place] with her father, who  has  health problems.  She works for an [employer], forensic doing office  work.  She is down to four cigarettes a day, has smoked for 37 years.  Has an occasional beer on Sundays when she watches the game.  Denies any  recreational substance use.   Mother deceased secondary to bladder cancer, also had a history of  diabetes, heart failure, renal insufficiency.  Father alive at age 100  with diabetes, peripheral vascular disease, coronary artery disease.  Sister with a colostomy.   REVIEW OF SYSTEMS:  Positive for cold chills, headache, presyncope  episode, chest discomfort as described above, numbness in fingers and  feet, and nausea.   PHYSICAL EXAM:  Temperature 97, pulse 89, respirations 18, blood  pressure 144/88.  [name] is in no acute distress.  Normocephalic, atraumatic.  Mucous membrane is moist.  [name] has no  vision in right eye.  Left pupil responds appropriately.  NECK:  Supple without bruits or JVD.  CARDIOVASCULAR:  S1 and S2, regular rate and rhythm.  LUNGS:  Clear to auscultation.  SKIN:  Warm and dry.  ABDOMEN:  Soft, nontender, positive bowel sounds.  LOWER  EXTREMITIES:  Without clubbing, cyanosis or edema.  NEUROLOGICALLY:  Alert and oriented x3.  Grips are equal and weak.   Chest x-ray showed no acute findings.  EKG:  Sinus rhythm at a rate of  85 without acute ST or T-wave changes.  Baseline lab work:  H&H 12.3,  37.2, WBC 8.3, platelets 285,000.  Sodium 137, potassium 4.3, chloride  106, BUN 13, creatinine 0.7, glucose 188.  A D-dimer less than 0.22.  Point of care first set:  Troponin less than 0.05.   Dr. Jerral Bonito in to examine and assess patient with medical history as  stated above.   IMPRESSION:  1. Spells.      a.     Vasovagal in the setting of impaction November 2008.      b.     Transient ischemic attack per neurology and December 08.      c.     Today while sitting at a computer the patient experienced an       episode of hazy vision, room spinning and headache as described       above.   Etiology unclear at this time.  The patient needs to be admitted to  monitor.  Will await further input from Dr. Daleen Squibb, who is familiar with  the patient.  Consider possible tilt table, implantable loop recorder.  Doubt implantable cardioverter-defibrillator needed at this time,  possibly ischemic.  We will cycle enzymes.      Dorian Pod, ACNP      Luis Abed, MD, [facility]  Electronically Signed    MB/MEDQ  D:  05/26/2007  T:  05/27/2007  Job:  [id number]

## 2010-10-01 NOTE — Discharge Summary (Signed)
NAMEMASON, [name]              ACCOUNT NO.:  [id number]   MEDICAL RECORD NO.:  [id number]          PATIENT TYPE:  INP   LOCATION:  5501                         FACILITY:  [facility]   PHYSICIAN:  Altha Harm, MDDATE OF BIRTH:  1955-12-22   DATE OF ADMISSION:  04/12/2007  DATE OF DISCHARGE:  04/15/2007                               DISCHARGE SUMMARY   DISPOSITION:  Home.   FINAL DIAGNOSES:  1. Near syncope.  2. Vasovagal reaction.  3. Constipation with impaction.  4. Hypotension, resolved.  5. History of hypertension.  6. Diabetes type 2.  7. Hyperlipidemia.  8. Gastroesophageal reflux disease.  9. Coronary artery disease diffuse with medical management.  10.Tobacco use.  11.Status post cholecystectomy.   PROCEDURES:  None.   DIAGNOSTIC STUDIES:  1. CT pulmonary angiogram done on the 24th was negative for any      pulmonary thromboembolism.  2. Abdominal x-ray 2-view done on the 24th shows no obstruction and no      free air.  3. CT of abdomen with contrast done on the 26th shows no acute      abnormality, probable incidental left adrenal adenoma.  No acute      abnormality of the pelvis.  A small amount of fat enters umbilical      hernia, where a small node is present near the umbilicus.   ALLERGIES:  PENICILLIN.   CODE STATUS:  Full code.   PRIMARY CARE PHYSICIAN:  Dr. [name].   CHIEF COMPLAINT:  Syncope.   HISTORY OF PRESENT ILLNESS:  Please see history and physical dictated by  Dr. Waymon Amato for details of the HPI.  However, in short, this is a  patient who had been constipated and had presented to the ER.  In the  ER, she had a disimpaction done and then went to her doctor's office to  cancel an appointment.  While at the doctor's office, she had a near  syncopal episode with a decrease in blood pressure and an increase in  heart rate.  Patient was transferred back to the hospital for admission.   HOSPITAL COURSE:  Near syncopal episode.  This was  felt to be vasovagal  status post the disimpaction.  While in the hospital, her  antihypertensives were discontinued and the patient' blood pressure  recovered.  The patient had no further near syncopal episodes and had no  further drops in her blood pressure.  Blood pressure medications were  reinstated and the patient tolerated it well without any difficulty.  The patient was initially made n.p.o. and placed on laxatives.  The  patient responded to the laxatives with bowel movements.  Her diet was  then advanced and she tolerated the diet without any problems.   DISCHARGE MEDICATIONS:  The patient is being discharged on the above  stated medications, which include the following:  1. Amaryl 4 mg p.o. daily.  2. Prevacid 30 mg p.o. daily.  3. Lisinopril 10 mg p.o. daily.  4. Hydrochlorothiazide 25 mg p.o. daily.  5. Simvastatin 20 mg p.o. q.h.s.  6. Aspirin 81 mg p.o. daily.  7.  Imdur 15 mg p.o. daily.  8. Coreg 3.125 mg p.o. b.i.d.  9. Fish oil 2 grams p.o. daily.  10.MiraLax 17 grams in 8 oz of fluid daily.   DIAGNOSTIC STUDIES:  The patient had a contrast study performed on the  26th and thus the Glucophage is to be held through the 28th and resumed  on the 29th.   CONDITION ON DISCHARGE:  Stable.   DIETARY RESTRICTIONS:  She should be on a low cholesterol, low-sodium,  diabetic diet.   PHYSICAL RESTRICTIONS:  None.   FOLLOW UP:  The patient is to follow up with her physician in 3-5 days,  or as needed, which ever occurs first.      Altha Harm, MD  Electronically Signed     MAM/MEDQ  D:  04/15/2007  T:  04/16/2007  Job:  [id number]   cc:   [name], M.D.

## 2010-10-01 NOTE — H&P (Signed)
NAMESHAINE, MOUNT NO.:  [id number]   MEDICAL RECORD NO.:  [id number]          PATIENT TYPE:  INP   LOCATION:  3705                         FACILITY:  [facility]   PHYSICIAN:  Luis Abed, MD, FACCDATE OF BIRTH:  11-Mar-1956   DATE OF ADMISSION:  11/02/2008  DATE OF DISCHARGE:                              HISTORY & PHYSICAL   PRIMARY CARDIOLOGIST:  Maisie Fus C. Daleen Squibb, MD, [facility]   ELECTROPHYSIOLOGIST:  Doylene Canning. Ladona Ridgel, MD   PRIMARY CARE PHYSICIAN:  [name], MD   CHIEF COMPLAINT:  Chest pain.   HISTORY OF PRESENT ILLNESS:  [name] is a 55 year old obese African  American female with a known history of CAD (40% proximal, 30-40% mid  LAD stenosis, 50% stenosis in first diagonal, 50% stenosis in ramus, 70-  80% stenosis in the first branch of ramus, 90% stenosis in second branch  of ramus, 70% mid RCA stenosis and calcification throughout), mixed  cardiomyopathy with EF estimated at 35-40%, non-insulin-dependent  diabetes mellitus (poorly controlled), history of syncope with unknown  etiology after EP workup and S/P loop recorder, hypertension,  dyslipidemia, and GERD who presents with chest pain.  The patient  reports nausea and several episodes of vomiting last Sunday and Monday  (October 29, 2008, October 30, 2008), which resolved and she was okay for  several days until today.  She had a normal morning routine and went to  the work.  However, in the late a.m., she began to experience left-sided  chest pain with radiation into her left axilla and her right upper  extremity.  Pain was 8/10 in severity at its worst, waxing and waning  course, which prompted the patient to alert EMS.  In route, she received  nitroglycerin with some alleviation of pain, but subsequent headache.  The patient reports associated nausea and dizziness, but no other  associated symptoms.  Upon arrival at the ED, the patient was  tachycardic at 103, but BP was close to within normal limits  at 127/61.  She remains mildly tachycardic at 97 and BP within normal limits at  120/81.  Continues to have an mild chest pain with subsequent  nitroglycerin given in ED.   PAST MEDICAL HISTORY:  1. CAD (see details above).  2. Mixed cardiomyopathy with EF of 35-45%.  3. Hypertension.  4. Dyslipidemia.  5. NIDDM.  6. History of syncope with negative EP study and no known etiology      after S/P loop recorder.  7. GERD.  8. Morbid obesity.   SOCIAL HISTORY:  The patient lives in [place] alone.  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  The patient has a 40-pack-year  smoking history, currently smoking about a quarter pack per day.  Minimal EtOH.  She used a 2-3 pounds drinks per week.  No illicit drug  use nor any herbal medication use.  The patient has been trying to  follow heart healthy diet with decreased fried foods and increased  fruits and vegetables, but not always  compliant.  The patient exercises  by dancing 10-15 minutes twice a week (normally without any symptoms).   FAMILY HISTORY:  Mother deceased at age 27 secondary to pneumonia,  positive history for end-stage renal disease, CHF, and cancer of the  gallbladder.  Father living at age 32, positive history for CAD, ? when  it began, also diabetes mellitus and hypertension.  The patient has one  brother and two sisters with no significant medical history.   REVIEW OF SYSTEMS:  See HPI, also significant constipated over the last  couple of days.  All other systems are reviewed and were negative.  The  patient is a full code.   ALLERGIES:  PENICILLIN.   MEDICATIONS:  1. Lyrica 75 mg p.o. b.i.d.  2. Lisinopril 10 mg p.o. daily.  3. Metformin 500 mg p.o. b.i.d.  4. Simvastatin 20 mg p.o. daily.  5. Aggrenox 200/25 mg p.o. b.i.d.  6. Glimepiride 4 mg p.o. daily.   PHYSICAL EXAMINATION:  VITAL SIGNS:  Temperature 97.8  degrees  Fahrenheit, BP 120/81, pulse 97, respiration rate 16, O2 saturation 100%  on room air.  GENERAL:  The patient is alert and oriented x3 in no apparent distress.  She is able to move and speak very easily without any respiratory  distress.  HEENT:  Head is normocephalic, atraumatic.  Pupils are equal, round, and  reactive to light.  Extraocular muscles are intact.  Nares were patent  without discharge.  Dentition is fair.  Oropharynx without erythema or  exudate.  NECK:  Supple without lymphadenopathy.  No thyromegaly, no JVD, and no  bruits.  HEART:  Heart rate is regular with audible S1, S2.  No clicks, rubs,  murmurs, or gallops.  Pulses are 1+ and equal in upper extremities  bilaterally, 2+ right DP pulse and absent left DP pulse and absent  popliteal pulse on left lower extremity.  LUNGS:  Clear to auscultation bilaterally.  SKIN:  No rashes, lesions, or petechiae.  ABDOMEN:  Soft, mildly tender, left side greater than right.  Normal  abdominal bowel sounds.  No rebound or guarding.  No hepatosplenomegaly.  No pulsations.  EXTREMITIES:  No clubbing, cyanosis, or edema.  MUSCULOSKELETAL:  No joint deformity or effusions.  No spinal or CVA  tenderness.  NEUROLOGIC:  Cranial nerves II through XII are grossly intact.  Strength  5/5 in all extremities and axial groups.  Normal sensation throughout  and normal cerebral function.   RADIOLOGY:  1. The patient had a chest x-ray that showed no acute cardiopulmonary      disease.  2. Echocardiogram completed on December 2008, showed LVEF 35-45%, mild      LVH, mild diffuse hypokinesis.  3. EKG.  Rhythm sinus tach, rate 104.  No significant ST-T-wave      changes.  No significant Q-waves, normal axis, no evidence of      hypertrophy.  PR 154, QRS 74, QTc 44.   LABS:  WBC 9.6 with normal differential, HGB 13.2, HCT 40.4, PLT count  208.  Sodium 136, potassium 4.3, chloride 99, CO2 27, BUN 13, creatinine  0.80, glucose 384, PT  12.4, INR 0.9, first full set of cardiac enzymes  were negative.   ASSESSMENT AND PLAN:  [name] is a 55 year old obese African American  female with significant past medical history described above who  presents with chest pain, known history of coronary artery disease  described above.  1. Chest pain.  2. History of syncope with workup by  EP with no obvious etiology.  3. Non-insulin-dependent diabetes mellitus.  4. Dyslipidemia.  5. Obesity.  6. Mixed cardiomyopathy, ejection fraction 35-45%.  Plan will be to      cycle cardiac enzymes, follow up EKG in the morning, we will      initiate cardiac cath planning for tomorrow secondary to high      pretest likelihood of cardiac etiology to the patient's symptoms.      We will continue Aggrenox, simvastatin, lisinopril, reinitiate beta-      blocker (the patient has taken Coreg in the past) and start heparin      per pharmacy.  We will also check a lipase due to the patient's      with recent nausea and vomiting as well as mild tenderness on exam.      We will continue home medications with the exception of metformin      in anticipation of cardiac catheterization.  Insulin sliding scale      will also be started beginning with the sensitive regimen.   Cardiology p.r.n. orders will also be ordered.      Jarrett Ables, [facility]      Luis Abed, MD, [facility]  Electronically Signed    MS/MEDQ  D:  11/02/2008  T:  11/03/2008  Job:  [id number]

## 2010-10-01 NOTE — Consult Note (Signed)
NAME:  [name], [name] NO.:  [id number]   MEDICAL RECORD NO.:  [id number]          PATIENT TYPE:  INP   LOCATION:  5502                         FACILITY:  [facility]   PHYSICIAN:  Duke Salvia, MD, FACCDATE OF BIRTH:  [birth year]   DATE OF CONSULTATION:  05/27/2007  DATE OF DISCHARGE:                                 CONSULTATION   Thank you very much for asking [place] to see [name] in consultation  because of recurrent syncope.   She is a 55 year old woman who first started having syncope in November.  There have been 4 spells.  Each of these spells has occurred after  changing positions from seated to standing.  They are  stereotypical in  their prodrome for her although she is not well able to identify the  characteristics of this prodrome.  The first happened after  disimpaction.  The second happened at work, getting up to go from her  desk to the bathroom.  The one yesterday happened also in that same  situation.  This last one was preceded by a sense that the computer was  getting dark.  There was no accompanying nausea, diaphoresis, flushing  or palpitations.  As she was walking to the bathroom, one of her  colleagues joined her, and then she became syncopal.  Following her  event, she was quite woozy and had residual orthostatic intolerance.   She has been admitted for these episodes and has undergone an evaluation  which has included cardiac testing, demonstrating a catheterization with  an ejection fraction of 35% to 40%, diffuse moderate coronary artery  disease, for which medical therapy was recommended.  Ejection fraction  was confirmed by echo at 35% to  40% with diffuse hypokinesis.   Her neurological evaluation included an MRA, demonstrating subacute  right parietal ischemia, no flow in the right vertebrobasilar junction,  possible stenosis of the left vertebrobasilar junction.  The EEG was  negative.   Her past medical history is potentially  relevant.  She has lost 120  pounds in the last 12 months without intention and without change in her  diet.   She has diabetes.  She has hypertension.  She has dyslipidemia.   Her medical review of systems is notable for GE reflux, arthralgia,  particularly in her left knee, otherwise broadly negative as noted on  the intake sheet.   Only medication prior to arrival apparently was Prevacid.  She is  currently taking Coreg at 3.125, simvastatin 20, metformin 500 b.i.d.,  Neurontin, Amaryl 4 b.i.d., and Colace, isosorbide, and Amitiza 24 mcg a  day.   She has an ALLERGY TO PENICILLIN.   SOCIAL HISTORY:  She has no children.  She lives with her father.  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   PHYSICAL EXAMINATION:  GENERAL:  She is a middle-aged [place] American  female appearing her stated age of 62.  VITAL SIGNS:  Her blood pressure is 123/65.  Her pulse is 82.  HEENT:  Exam demonstrated no icterus or xanthomata.  The sclerae were  clear.  NECK:  Her neck veins were flat.  Her carotids were brisk and full  bilaterally without bruits.  There was no lymphadenopathy.  BACK:  The back was without kyphosis or scoliosis.  LUNGS:  The lungs were clear.  HEART:  Heart sounds were regular without murmurs or gallops.  ABDOMEN:  Soft with active bowel sounds without midline pulsation or  hepatomegaly.  EXTREMITIES:  Femoral pulses were not examined.  Distal pulses were  trace.  There was no clubbing, cyanosis or edema.  NEUROLOGICAL:  Exam  was grossly normal.  SKIN:  Warm and dry.   Laboratories were largely unremarkable with a sed rate that was 42, a  hemoglobin that was 12.3, BUN and creatinine of 13 and 0.7.  Her LFTs  were normal.  TSH was also normal as were her T3 and T4.   Electrocardiogram dated January 7 demonstrated sinus rhythm at 89 with  intervals of 0.16/0.08/0.42.  Electrocardiogram was otherwise normal  apart from mild T-wave  flattening.   IMPRESSION:  1. Recurrent syncope associated with changes in position and issues      related to bowel or urine release.  2. A 117-pound weight loss over the last 12 to 14 months with normal      thyroid function.  3. Cardiomyopathy.      a.     Coronary artery disease.      b.     Diffuse hypokinesis.      c.     Ejection fraction of 35% to 40%.  4. Diabetes.  5. Hypertension.  6. Intracranial vascular disease.   Ms. Reising has had significant weight loss in the last 12 to 14 months  which has been unintentional.  This suggests an underlying serious  systemic process notwithstanding the sed rate of 42.  The syncope occurs  in the same temporal context, and one has to wonder whether they are  related.   Her cardiomyopathy, however, is concerning as potentially another  explanation for her syncope.  Her cardiomyopathy is mixed with coronary  artery disease with diffuse hypokinesis.  EP testing is not all that  sensitive in patients with ischemic myopathy, it is much less so in  patients with nonischemic myopathy, but it may well be useful here  notwithstanding.  Tilt table testing may or may not be of benefit, but I  would probably lead towards loop recorder use in the event that she were  not inducible at EP testing.  There are some data in patients who have  ejection fractions of less than 35% that syncope even with a negative EP  study is appropriately treated with an ICD.   This gets to the other issue, and that is why has this lady lost 120  pounds in the last 14 months and does she have a systemic process, i.e.  malignancy, contributing to this, which would have an impact on her  prognosis.  I have discussed this with Dr. Darnelle Catalan, who will pursue this as  the initial avenue.   We will follow closely and pursue syncopal evaluation once we have some  clarity as to whether there is an underlying systemic problem.   RECOMMENDATIONS:  For now, though, I would, 1)  consider the initiation  of an ACE inhibitor for her diabetes, renal protection as well as her  cardiomyopathy, 2) defer to Dr. Darnelle Catalan evaluation for possible malignancy,  and 3) EP testing plus/minus loop or ICD to follow #2.   Thank you for  the consultation.      Duke Salvia, MD, [facility]  Electronically Signed     SCK/MEDQ  D:  05/27/2007  T:  05/27/2007  Job:  [id number]   cc:   [name], M.D.

## 2010-10-01 NOTE — Assessment & Plan Note (Signed)
[facility]                         ELECTROPHYSIOLOGY OFFICE NOTE   BABBIE, [name]                     MRN:          [id number]  DATE:09/07/2007                            DOB:          07-11-1955    [name] returns today for follow-up.  She is a very pleasant woman  with a history of unexplained syncope status post insertion of  implantable loop recorder.  She also has diffuse coronary disease but no  tight stenosis by catheterization several months ago.  Her EF was 40-  45%.  She since her loop recorder insertion has had no recurrent  syncope.  Otherwise, she had no specific complaints today.  She denied  shortness of breath.  She did have left-sided chest pain which was made  worse by palpation.  She also complains of pain in her lower  extremities.  This is worse when she walks.   Her medications include:  1. Zocor 20 a day.  2. Glipizide 10 a day.  3. Metformin 1000 mg twice daily.  4. Aggrenox 25 twice daily.  5. Coreg 3.125 twice daily.  6. Neurontin 300 twice daily.  7. Glimepiride 4 mg twice daily.   On physical exam, she was a pleasant, middle-aged woman in no distress.  Blood pressure was 108/70, the pulse 96 and regular, respirations were  18.  Weight was 206 pounds.  NECK:  Revealed no jugular venous distention.  LUNGS:  Clear bilaterally to auscultation.  No wheezes, rales or rhonchi  were present.  CARDIOVASCULAR EXAM:  Revealed a regular rate and rhythm  with a normal S1-S2.  The chest exam demonstrated clear tenderness on the lateral side of her  chest wall just below the axilla.  ABDOMINAL EXAM:  Obese, nontender, nondistended.  EXTREMITIES:  Demonstrated no edema.   Review of her loop recorder today demonstrates multiple episodes of  noise but no clear-cut bradycardia or tachycardia.   IMPRESSION:  1. Chest pain consistent with chest wall pain.  2. Leg pain, unclear whether this is claudication, whether this  is      neuropathic pain, or whether this is related to her statin therapy.  3. Hypertension.  4. Diabetes.   DISCUSSION:  The etiology of the patient's symptoms are unclear.  I  suspect that this is probably neuropathy, but because there is an  exertional relationship to her leg pain, I have  asked that we undergo Doppler studies to rule out occult peripheral  vascular disease.  I have  also asked that she stop her simvastatin as it may be that she is having  myopathy from her statin therapy, though this seems unlikely.  I will  see her back in several weeks.     [name]. [name] Ridgel, MD  Electronically Signed    GWT/MedQ  DD: 09/07/2007  DT: 09/07/2007  Job #: [id number]

## 2010-10-01 NOTE — Cardiovascular Report (Signed)
[name], [name]              ACCOUNT NO.:  [id number]   MEDICAL RECORD NO.:  [id number]          PATIENT TYPE:  INP   LOCATION:  3705                         FACILITY:  [facility]   PHYSICIAN:  Rollene Rotunda, MD, FACCDATE OF BIRTH:  [birth year]   DATE OF PROCEDURE:  11/03/2008  DATE OF DISCHARGE:                            CARDIAC CATHETERIZATION   PRIMARY CARE PHYSICIAN:  [name], MD   CARDIOLOGIST:  [name], MD, [facility]   PROCEDURES:  Left heart catheterization/coronary arteriography.   INDICATION:  Evaluate the patient with chest pain.  She had previous  nonobstructive coronary disease and small vessel disease and was managed  medically.   PROCEDURE NOTE:  Left heart catheterization performed via the right  femoral artery, the artery was cannulated using an anterior wall  puncture.  A #5-French arterial sheath was inserted via the modified  Seldinger technique.  Preformed Judkins and a pigtail catheter were  utilized.  The patient tolerated the procedure well and left the lab in  stable condition.   RESULTS OF HEMODYNAMICS:  1. LV 152/10.  2. AO 147/76.   CORONARIES:  Left main was normal.  The LAD had proximal 25% stenosis.  There was mid long 30% stenosis, distal 30% stenosis.  There were very  small diagonals with diffuse predominantly nonobstructive disease, these  were not well visualized.  However, they were tiny, not clinically  significant vessels.  They could contribute to chest discomfort but not  large area of ischemia.  Circumflex in the AV groove had mid 50%  stenosis after mid obtuse marginal.  Mid obtuse marginal was long but  narrow in caliber.  There was long 75% diffuse stenosis (this was  unchanged from previous).  Ramus intermediate was large and long.  There  was proximal 50% to 60% stenosis.  The superior branch had focal 80%  stenosis.  It was a narrow caliber vessel.  The inferior branch was free  of high-grade stenosis.  The right  coronary artery is a large dominant  vessel.  There was long proximal 50-60% stenosis.  There was diffuse  nonobstructive mid disease.  PDA was moderate sized and normal.   VENTRICULOGRAM:  The left ventricle was somewhat underfilled during the  ventriculogram.  This was taken in the RAO view.  EF was 45-50% with  mild apical/anteroapical/inferoapical hyperkinesis.   CONCLUSION:  A 3-vessel coronary artery disease which is predominantly  small vessel with diffuse disease.  I reviewed carefully the current  catheterization side-by-side to the previous catheterization and found  there to be no significant change with perhaps slight progression.  I  would again suggest medical management of this disease.      Rollene Rotunda, MD, [facility]  Electronically Signed     JH/MEDQ  D:  11/03/2008  T:  11/04/2008  Job:  [id number]   cc:   [name], M.D.

## 2010-10-01 NOTE — Discharge Summary (Signed)
NAMEGREGORY, DOWE              ACCOUNT NO.:  [id number]   MEDICAL RECORD NO.:  [id number]          PATIENT TYPE:  INP   LOCATION:  3705                         FACILITY:  [facility]   PHYSICIAN:  Pricilla Riffle, MD, FACCDATE OF BIRTH:  1955/11/21   DATE OF ADMISSION:  11/02/2008  DATE OF DISCHARGE:  11/04/2008                               DISCHARGE SUMMARY   PROCEDURES:  1. Cardiac catheterization.  2. Coronary arteriogram.  3. Left ventriculogram.   PRIMARY FINAL DISCHARGE DIAGNOSES:  1. Chest pain, noncritical coronary artery disease at cath, and      medical therapy recommended.  2. Mixed cardiomyopathy with an ejection fraction of 45-50% at cath      this admission.  3. Non-insulin-dependent diabetes, hemoglobin A1c 9.6 this admission.  4. History of syncope, status post electrophysiologic workup and loop      recorder without calls found.  5. Hypertension.  6. Dyslipidemia/hyperlipidemia with a total cholesterol of 217,      triglycerides 183, HDL 42, LDL 138 this admission, so far      increased.  7. Ongoing tobacco use.  8. Morbid obesity.  9. Family history of coronary artery disease in her father.  10.Allergy or intolerance to PENICILLIN.   TIME AT DISCHARGE:  43 minutes.   HOSPITAL COURSE:  Ms. Donlon is a 55 year old female with a history of  nonobstructive coronary artery disease.  She had chest pain and came to  the hospital where she was admitted for further evaluation and  treatment.   Her cardiac enzymes were negative for MI.  Cardiac catheterization on  November 03, 2008, showed left main normal, LAD 30%, circumflex 50%, obtuse  marginal 75% diffuse, ramus 50-60%, dominant RCA 50-60% and an EF of 45-  50%.  Dr. Antoine Poche evaluated the films and felt that that Ms. Nolden had  small-vessel diffuse disease, but no significant change from previous  catheterization.  Medical management was recommended.   Her loop recorder was interrogated, and she had one symptom  of that, but  no heart rate change.  The patient was told that the battery was low,  but the device was functioning normally.  A smoking cessation consult  was called, and the patient is encouraged to quit.   On November 04, 2008, Ms. San was mildly anemic with a hemoglobin of 11.4  and hematocrit 34.2.  Her cath site was without bruit or hematoma.  She  was tolerating the increased dose of Zocor well.  She was not on her  hydralazine because it was not on her home med list.  However, her blood  pressure was generally less than 130 systolic.  She has Imdur added to  her medication regimen, so we will hold the hydralazine for now.  She  states that she has problems with indigestion, especially first thing in  the morning and Protonix was added to her medication regimen as well.  Dr. Dietrich Pates evaluated Ms. Claudette Laws on November 04, 2008, and considered her  stable for discharge.   DISCHARGE INSTRUCTIONS:  1. Her activity level is to be increased gradually with no  driving for      2 days and no lifting for a week.  2. She is to stick to a low-sodium diabetic diet.  3. She is to call our office for problems with the cath site.  4. She is to follow up with Dr. Daleen Squibb and Dr. Ladona Ridgel and our office      will call.  She is to follow up with Dr. Mikeal Hawthorne as well.   DISCHARGE MEDICATIONS:  1. Lisinopril 10 mg a day.  2. Glimepiride 4 mg a day.  3. Lyrica 150 mg b.i.d. as at home.  4. Aggrenox b.i.d.  5. Simvastatin 40 mg a day.  6. Metformin 500 mg b.i.d., is on hold until November 06, 2008.  7. Hydralazine 10 mg q.i.d., is on hold.  8. Protonix 40 mg daily.  9. Imdur 30 mg daily.      Theodore Demark, PA-C      Pricilla Riffle, MD, [facility]  Electronically Signed    RB/MEDQ  D:  11/04/2008  T:  11/04/2008  Job:  [id number]   cc:   [name], M.D.

## 2010-10-01 NOTE — Consult Note (Signed)
NAME:  [name], [name] NO.:  [id number]   MEDICAL RECORD NO.:  [id number]          PATIENT TYPE:  INP   LOCATION:  5502                         FACILITY:  [facility]   PHYSICIAN:  [name], M.D.    DATE OF BIRTH:  [birth year]   DATE OF CONSULTATION:  DATE OF DISCHARGE:                                 CONSULTATION   HISTORY:  Patient admitted with syncope and weight loss, undergoing  multiple evaluations, well known to our service, but also has seen,  based on the chart, Dr. Loreta Ave and Dr. Russella Dar in the past with multiple GI  workups in the past, on her last CT, questionable rectal mass and then a  colonoscopy I believe in 2006, based on the computer, which was done in  our office and we will need to get the chart to confirm.  She has not  seen any blood in her bowels, but has been constipated.  She was seen  earlier in December by Dr. Bosie Clos for some upper tract symptoms, with  a normal upper GI and small bowel series except for a little bit of  reflux.  She has had multiple other CTs which been nonrevealing and  tumor markers are all normal.   PAST MEDICAL HISTORY:  Pertinent for:  1. Diabetes.  2. Hypertension.  3. Reflux.  4. Coronary artery disease.  5. Peripheral neuropathy.  6. Right parietal CVA.  7. Cataracts.  8. Cholecystectomy.  9. 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   FAMILY HISTORY:  Pertinent for 1 sister with a colostomy; she is not  sure why, does not think it was cancer.  Her mom it sounds like a  gallbladder cancer.   MEDICATIONS AT HOME:  Included Aggrenox, Amitiza, glimepiride,  metformin, Neurontin, Prevacid, simvastatin, carvedilol and Imdur.   ALLERGIES:  PENICILLIN.   REVIEW OF SYSTEMS:  Pertinent for her eating well, eating 3 meals a day  and really not having any GI complaints now.  The laxative she got here  in the hospital seemed to help.   ASSESSMENT:  1. Multiple medical problems.  2. Weight loss, questionable etiology.  3.  Abnormal CT scan for the rectum.  4. History of colon polyps.   PLAN:  I will check the office chart to see if she needs a full  colonoscopy or just a flexible sigmoidoscopy.  I have discussed the case  with Dr. Darnelle Catalan and the patient.  We will proceed either as an outpatient  or early next week with one of the 2 procedures.  In the meantime,  further weight loss per primary team.           ______________________________  [name], M.D.     MEM/MEDQ  D:  05/29/2007  T:  05/30/2007  Job:  [id number]   cc:   Duke Salvia, MD, [facility]  Hillery Aldo, M.D.  [name], M.D.

## 2010-10-01 NOTE — Op Note (Signed)
NAMEGWENDOLEN, [name]              ACCOUNT NO.:  [id number]   MEDICAL RECORD NO.:  [id number]          PATIENT TYPE:  INP   LOCATION:  5502                         FACILITY:  [facility]   PHYSICIAN:  [name], M.D.    DATE OF BIRTH:  [birth year]   DATE OF PROCEDURE:  06/02/2007  DATE OF DISCHARGE:                               OPERATIVE REPORT   PROCEDURE:  Colonoscopy.   INDICATIONS:  Patient with history of colon polyps, significant weight  loss, abnormal CAT scan with a question about her rectum.  Consent was  signed after risks, benefits, methods, options thoroughly discussed  multiple times in the past.   MEDICINES USED:  1. Fentanyl 100 mcg.  2. Versed 8 mg.   PROCEDURE:  Rectal inspection is pertinent for external hemorrhoids,  small.  Digital exam was negative.  The video colonoscope was inserted  and with abdominal pressure easily able to be advanced to the cecum.  Before advancing, we did evaluate the rectum on straight and retroflex  visualization without abnormality but some small hemorrhoids.  On  insertion, some left-sided diverticula were seen.  A small descending  polyp was seen on insertion and cold biopsied x1 on insertion.  No other  abnormalities were seen on insertion.  The scope was inserted a short  ways in the terminal ileum which was normal.  Photo documentation was  obtained.  The scope was slowly withdrawn.  The prep was adequate.  There was some liquid stool that required washing and suctioning.  On  slow withdrawal through the colon, the cecum, ascending and transverse  were normal.  As the scope was withdrawn around the left side of the  colon, the polyp in the descending that was seen and biopsied on  insertion was found and re-biopsied x1.  Just proximal to this, a  questionable other tiny polyp was seen but by the time we advanced the  biopsy forceps, we could no longer find it.  We did advance and withdraw  and could not ever definitively find  it.  The scope was further  withdrawn.  A second small descending polyp was seen and was cold  biopsied x4, put in the same container.  The scope was further  withdrawn.  The left-sided occasional diverticula were confirmed  primarily in the sigmoid and two tiny hyperplastic-appearing distal  sigmoid polyps were seen, cold biopsied and put in a separate container.  Again, once back in the rectum, anorectal pull-through and retroflexion  confirmed some small hemorrhoids only without any mass.  The scope was  straightened and readvanced a short ways up the left side of the colon.  Air was suctioned and scope removed.  The patient tolerated the  procedure well.  There was no obvious immediate complication.   ENDOSCOPIC DIAGNOSES:  1. Internal and external hemorrhoids.  2. Rare left-sided diverticula.  3. Two descending small polyps, cold biopsied.  Possibly one tiny one      not found to be able to biopsy possibly seen.  4. Two distal sigmoid tiny hyperplastic-appearing polyps cold      biopsied.  5. Otherwise within normal limits to the terminal ileum, specifically      without any rectal mass or causes or signs of reasons for her      weight loss.   PLAN:  Await pathology to determine future colonic screening.  No  further GI workup at this time since she had a negative CT scan, okay to  go home from my standpoint.  Have discussed the above with her father,  further workup and plans per the [facility] team and primary care.           ______________________________  [name], M.D.     MEM/MEDQ  D:  06/02/2007  T:  06/02/2007  Job:  [id number]   cc:   Hillery Aldo, M.D.  [name], M.D.  Duke Salvia, MD, [facility]

## 2010-10-01 NOTE — Consult Note (Signed)
NAME:  [name], NOTCH              ACCOUNT NO.:  [id number]   MEDICAL RECORD NO.:  [id number]          PATIENT TYPE:  INP   LOCATION:  4703                         FACILITY:  [facility]   PHYSICIAN:  Shirley Friar, MDDATE OF BIRTH:  05-26-1955   DATE OF CONSULTATION:  DATE OF DISCHARGE:                                 CONSULTATION   We were asked to see [name] today for abdominal pain, by Dr. Michaelyn Barter.   HISTORY OF PRESENT ILLNESS:  This is a 55 year old female who was  admitted with syncope and hypotension.  She has been a diabetic since  1996, and during this hospitalization has been diagnosed with a possible  small cerebrovascular event.  The patient's GI complaints include some  dysphagia to solids and pills, postprandial pain.  It has been going on  for a long time.  She says she experiences pain, both in her hypogastric  area, all the way up through her esophagus.  She describes it as  sometimes sharp and sometimes achy.  Third, she tells me she has long-  term constipation.  MiraLax did not help.  She has recently started  Amitiza b.i.d.  Number 4, she describes chronic nausea, burping and  hiccupping.  She denies hematemesis, any vomiting, hematochezia or  melena.   PAST MEDICAL HISTORY:  Is significant for type 2 diabetes, coronary  artery disease.  She is status post coronary artery bypass grafting.  She had a cardiac cath in November 2008 that showed global hypokinesis.  She has GERD, chronic constipation, hypertension, hyperlipidemia,  cataracts, possibly neuropathy, tobacco abuse.  Has a history of near-  syncopal episodes.  She is status post cholecystectomy, bilateral small  toe operations.  Her last colonoscopy was done in 2006 by Dr. Vida Rigger, which was relatively normal.  It revealed two polyps that were  removed, as well as diverticulosis.   ALLERGIES:  SHE HAS AN ALLERGY TO PENICILLIN.   CURRENT MEDICATIONS:  Include isosorbide mononitrate,  HCTZ, simvastatin,  Amitiza, Prevacid, carvedilol and 81 mg aspirin, which I believe has  just been discontinued in they are going to start Aggrenox instead,  Glimepiride.   SOCIAL HISTORY:  Positive tobacco.  Minimal alcohol use.  She lives  alone.   FAMILY HISTORY:  Negative for colon cancer, positive for a grandfather  with ulcer disease and positive for a sister with a colostomy.   PHYSICAL EXAM:  GENERAL:  She is alert and oriented, in no apparent  distress.  HEART:  Has a regular rate and rhythm.  ABDOMEN:  Obese, soft, tender throughout, but particularly in the lower  quadrants.  She has good bowel sounds.   LABS:  Show a hemoglobin of 11.6, hematocrit 33.9, white count 7.5,  platelets to 229,000.  BMET shows a slightly low potassium at 3.4 and an  elevated glucose of 165.  Her LFTs are normal.  Her albumin is 3.0.  CT  scan done both on April 28, 2007, as well as April 14, 2007 show  no acute abnormality, left adrenal adenoma, diverticulosis without  diverticulitis, and an umbilical  hernia that contains fat.   ASSESSMENT:  Dr. Charlott Rakes has seen and examined the patient,  collected history, and reviewed her chart.  His impression is this is a  55 year old female with multiple medical problems including a 13-year  history of diabetes and now hypotension, which could be contributing to  her abdominal symptoms.  It could be a result the patient's symptoms  have a number of etiologies, including gastroparesis, reflux worsened by  her chronic constipation.  Her hernia could be contributing to her pain.  Will do upper GI series, which can be done as an outpatient but patient  wants workup done as an inpatient.  No indication to do an upper  endoscopy at this time.   Thanks very much for this consultation.      Stephani Police, [place]      Shirley Friar, MD  Electronically Signed    MLY/MEDQ  D:  04/30/2007  T:  05/01/2007  Job:  [id number]   cc:   [name], M.D.  Shirley Friar, MD  Michaelyn Barter, M.D.  [name], M.D.

## 2010-10-01 NOTE — Procedures (Signed)
EEG NUMBER:  [id number].   REFERRING PHYSICIAN:  Michaelyn Barter, M.D.   CLINICAL HISTORY:  A 55 year old lady with syncopal episodes.   MEDICATION LISTED:  Lovenox, aspirin, Protonix, NovoLog, Amaryl, Zocor,  morphine, Ambien, Tylenol and __________ .   This is a 17-channel EEG recorded with the patient awake and alert using  standard 10/20 electrode placement.   Background awake rhythm consists of 10 Hz alpha which is of moderate  amplitude synchronous reactive to eye-opening and closure.  No  paroxysmal epileptiform activity spikes or sharp waves are seen.  Definitive sleep stages are not seen except for mild drowsiness which is  uneventful.  Hyperventilation and photic stimulation are both  unremarkable.  EKG tracing reveals regular sinus rhythm.  Length of this  EEG study is 1.2 minutes.  Technical component is average.   IMPRESSION:  This EEG performed during awake state is within normal  limits.  No definite epileptiform features were noted.           ______________________________  Sunny Schlein. Pearlean Brownie, MD     EAV:[id number]  D:  04/29/2007 16:09:16  T:  04/30/2007 11:37:21  Job #:  [id number]   cc:   Michaelyn Barter, M.D.

## 2010-10-01 NOTE — Assessment & Plan Note (Signed)
[facility]                                 ON-CALL NOTE   [name], [name]                     MRN:          [id number]  DATE:04/26/2007                            DOB:          1955-11-10    She is a patient of Dr. Vern Claude.  [name] called for a second time  this evening questioning whether or not her symptoms could be cardiac in  origin.  I reiterated that I had no way of knowing just by talking to  her over the phone and that again, she should come into the ED for EKG,  lab work and additional evaluation.  She says she may either come in  tonight or first thing in the morning.     [name], ANP  Electronically Signed    CB/MedQ  DD: 04/26/2007  DT: 04/27/2007  Job #: [id number]

## 2010-10-01 NOTE — H&P (Signed)
NAME:  [name], [name]              ACCOUNT NO.:  [id number]   MEDICAL RECORD NO.:  [id number]          PATIENT TYPE:  INP   LOCATION:  1827                         FACILITY:  [facility]   PHYSICIAN:  Michaelyn Barter, M.D. DATE OF BIRTH:  1956/03/21   DATE OF ADMISSION:  04/27/2007  DATE OF DISCHARGE:                              HISTORY & PHYSICAL   PRIMARY CARE DOCTOR:  [name], M.D.   CHIEF COMPLAINT:  I passed out.   HISTORY OF PRESENT ILLNESS:  Ms. Schlick is a 55 year old female with a  past medical history of syncopal episodes as well as diabetes mellitus  and hypertension who had previously been evaluated back in November,  2008 for a near-syncopal episode.  She indicates that this morning at  approximately 10:30 a.m., she was at work.  She states that she went  outside to smoke a half of a cigarette.  She returned to the building  and went to the bathroom to urinate.  After urinating, she took a few  steps towards her desk and then passed out.  There were no pre-warning  symptoms.  She denies chest pain or shortness of breath prior to the  episode.  She is not sure how long she had passed out for.  There was no  loss of bowel or urinary incontinence.  No jerking activities suggestive  of a seizure episode.  She indicates that she felt fine this morning.  She had some slight numbness in her left fingers shortly after  showering, which lasted approximately 15-20 minutes, but otherwise there  were no other symptoms.  She indicates that following the episode of  passing out, her blood pressure was checked, and she was found to be  severely hypotensive.  She denies having any nausea, vomiting, fevers or  chills.  She indicates that she felt fine the entire weekend.  Her CBG  was checked and found to be 277.  Currently, she indicates that she  feels woozy.   PAST MEDICAL HISTORY:  1. Near-syncopal episode in November, 2008.  2. Vasovagal reaction.  3. Constipation with  impaction.  4. Hypotension.  5. Hypertension.  6. Diabetes mellitus type 2.  7. Hyperlipidemia.  8. GERD.  9. Coronary artery disease.  10.The patient had a 2D echocardiogram completed on March 24, 2007      which revealed left ventricular ejection fraction to be 40-45% and      mild diastolic dysfunction was also noted.  11.Epigastric pain associated with GERD.  12.Cataracts.  13.Patient had a cardiac catheterization completed on March 24, 2007      which revealed global hypokinesis out of proportion to the      patient's coronary artery disease, question diabetic      cardiomyopathy, diffuse three vessel disease with a segmental 70-      80% proximal RCA, 70-80% marginal, and intermediate disease.   PAST SURGICAL HISTORY:  1. Bilateral small toe operations.  2. Right cataract removed.   ALLERGIES:  PENICILLIN caused the patient to swell and to develop a  rash.   HOME MEDICATIONS:  1. Isosorbide mononitrate 15  mg extended release daily.  2. Hydrochlorothiazide 25 mg daily.  3. Simvastatin 20 mg daily.  4. Amitiza 24 mcg daily.  5. Prevacid 30 mg daily.  6. Carvedilol 3.125 mg p.o. b.i.d.  7. Aspirin 81 mg daily.  8. Glimepiride 4 mg daily.   SOCIAL HISTORY:  Cigarettes.  Patient has been smoking cigarettes for at  least 36 years; however, she smokes only approximately two cigarettes  per day. Alcohol:  Occasional.   FAMILY HISTORY:  Mother died from bladder cancer on April 04, 2006.  She also had diabetes mellitus, hypertension, congestive heart failure,  and renal failure.  Father has a history of diabetes mellitus and a  prosthetic leg.   REVIEW OF SYSTEMS:  As per HPI, otherwise all other systems are  negative.   PHYSICAL EXAMINATION:  GENERAL:  The patient is awake.  She is  cooperative.  She is in no obvious distress.  VITAL SIGNS:  Temperature 97.4, blood pressure 108/65 initially.  It  declined to 86/42 several hours later.  Heart rate 103.   Respirations  22.  O2 sat 99-100% on room air.  HEENT:  Normocephalic and atraumatic.  Anicteric.  Extraocular movements  are intact.  Oral mucosa is pink.  No thrush.  No exudates.  NECK:  No JVD.  No lymphadenopathy.  No thyromegaly.  Strong carotid  upstrokes bilaterally.  No bruits.  CARDIAC:  S1 and S2 present.  Regular rate and rhythm.  RESPIRATORY:  No crackles or wheezes.  ABDOMEN:  Soft.  Diffuse tenderness throughout all four quadrants.  There is some slight guarding.  No masses palpated.  Positive bowel  sounds x4 quadrants.  EXTREMITIES:  No leg edema.  NEUROLOGIC:  Patient is alert and oriented x3.  MUSCULOSKELETAL:  Upper and lower extremity strength 5/5.   LABS:  CK-MB, POC, less than 1.  Troponin I, POC, less than 0.05.  The  pH 7.415, pCO2 46.5, bicarbonate 29.8, hemoglobin 15, hematocrit 44.  Sodium 133, potassium 3.7, chloride 99, glucose 238, BUN 24.   An EKG was completed, which reveals sinus tachycardia.  No Q waves or ST  segment abnormalities.   The patient had x-rays completed of her pelvis, which revealed no acute  fractures.  X-rays of the patient's neck revealed degenerative cervical  spondylosis, normal alignment, and no acute bony finding.   ASSESSMENT/PLAN:  1. The etiology of the patient's syncopal episode was cardiac versus      noncardiac.  Suspected precipitating factors include vasovagal      reaction following the patient using the bathroom, likewise volume      depletion/hypotension may have played a role here.  The patient has      been taking diuretics as well as other antihypertensive      medications.  These may have caused the patient's blood pressure to      decline, thus precipitating her syncopal episode.  Will check an      MRI of the patient's brain as well as an MRA.  Will perform carotid      Dopplers, check a TSH/T4, RPR, and B12.  Will also check      orthostatics on the patient and will hold her antihypertensive       medications for now.  2. Hypotension:  This may have played a strong role in the patient's      syncopal episode.  The patient has hypertension; however, her blood      pressure is low.  I  suspect that volume depletion may have      contributed.  Will hydrate the patient and hold her      antihypertensive medications for now.  Will check orthostatics.  3. History of diabetes mellitus:  Will perform Accu-Cheks and resume      the patient's previously prescribed diabetic medications.  4. Hyperlipidemia:  Will check a fasting lipid profile.  5. Abdominal pain:  The source of the patient's current abdominal pain      is questionable.  We will consider a CT scan of the patient's      abdomen.  6. Gastrointestinal proximal:  We will provide Protonix.  7. Deep venous thrombosis prophylaxis:  We will provide Lovenox.      Michaelyn Barter, M.D.  Electronically Signed     OR/MEDQ  D:  04/27/2007  T:  04/27/2007  Job:  [id number]   cc:   [name], M.D.

## 2010-10-01 NOTE — Discharge Summary (Signed)
[name], [name]              ACCOUNT NO.:  [id number]   MEDICAL RECORD NO.:  [id number]          PATIENT TYPE:  INP   LOCATION:  5502                         FACILITY:  [facility]   PHYSICIAN:  Madaline Savage, MD        DATE OF BIRTH:  [birth year]   DATE OF ADMISSION:  05/26/2007  DATE OF DISCHARGE:  06/03/2007                               DISCHARGE SUMMARY   ADDENDUM:  Please note this is an addendum to a discharge summary  dictated by Dr. Salome Arnt on June 01, 2007.   This covers the dates of June 02, 2007, and June 03, 2007.   For a complete list of discharge diagnoses, see the last discharge  summary.   FINAL DISCHARGE MEDICATIONS:  1. Aggrenox 1 capsule twice daily.  2. Amitiza 24 mcg daily.  3. Glimepiride 4 mg twice daily.  4. Metformin 500 mg twice daily.  5. Neurontin 300 mg twice daily.  6. Prevacid 30 mg daily.  7. Zocor 20 mg daily.  8. Coreg 3.125 mg twice daily.  9. Imdur 15 mg daily.   PROCEDURE:  She had a colonoscopy done on June 02, 2007, which did  not show any rectal mass.  It showed internal and external hemorrhoids,  rate left-sided diverticula, two descending small polyps, core biopsied.  Two distal sigmoid, tiny, hyperplastic-appearing polyps, biopsied.   [name] was admitted here with presyncope and had undergone extensive  workup including an electrophysiologic workup by Dr. Ladona Ridgel. She has not  had any more syncopal episodes in the hospital.  She has been cleared  for discharge.  She had complained of significant weight loss in the  last one year and she had malignancy workup which was ongoing.  Her CT  scan showed a questionable rectal mass and GI was consulted for  colonoscopy. Her colonoscopy did not show rectal mass and she has been  cleared for discharge by the gastroenterologist.   At this time, she is stable and being discharged home in stable  condition.   FOLLOWUP:  She is asked to follow up with her family care doctor,  Dr.  Mikeal Hawthorne in one week.  She is also asked to follow up with Dr. Ladona Ridgel with  electrophysiology in 2-3 weeks.     Madaline Savage, MD  Electronically Signed    PKN/MEDQ  D:  06/03/2007  T:  06/03/2007  Job:  [id number] [id number]

## 2010-10-01 NOTE — Consult Note (Signed)
NAMEAMYIAH, GABA              ACCOUNT NO.:  [id number]   MEDICAL RECORD NO.:  [id number]          PATIENT TYPE:  INP   LOCATION:  4703                         FACILITY:  [facility]   PHYSICIAN:  Gustavus Messing. Orlin Hilding, M.D.DATE OF BIRTH:  [birth year]   DATE OF CONSULTATION:  04/28/2007  DATE OF DISCHARGE:                                 CONSULTATION   REFERRING PHYSICIAN:  Dr. Michaelyn Barter.   REASON FOR CONSULTATION:  Abnormal MRI suggesting a right parietal  stroke and syncope.   HISTORY OF PRESENT ILLNESS:  [name] is a 55 year old right-handed  black woman with multiple medical problems including coronary artery  disease, type 2 diabetes, hypertension and hyperlipidemia, who was  admitted with syncope and found to have a possible stroke on her MRI in  the right parietal region.  She has had similar episodes in the past of  near syncope with dizziness and she said she passes out all the time  that work.  She does have loss of consciousness of 1 or 2 minutes with  spontaneous resolution.  She has had a low blood pressure in the past  with syncopal episodes.  She has also had some numbness of her hand this  morning, about 15-20 minutes, numbness of both fingers, more on the left  with some pain in the left arm from the elbow down to the hand and pain  in the left leg.   REVIEW OF SYSTEMS:  Positive for some headache and facial pain and  otherwise as indicated in the history of present illness.   PAST MEDICAL HISTORY:  1. Type 2 diabetes.  2. Coronary artery disease, status post cath with three-vessel      disease.  3. She has GERD.  4. Constipation.  5. Hypertension.  6. Hyperlipidemia.  7. Cataracts.  8. Possibly neuropathy.   MEDICATIONS:  1. Aspirin 81 mg a day.  2. Lovenox.  3. Amaryl 4 mg twice a day.  4. Sliding-scale insulin.  5. Protonix 40 mg daily.  6. Zocor 20 mg daily.  7. Tylenol.  8. Morphine p.r.n.  9. Ambien.   ALLERGIES:  PENICILLIN.   SOCIAL HISTORY:  She is a smoker, 36 years, 1-2 packs per day.  Minimal  alcohol.  No illicit drug use.  She lives alone.  She is single.  She  does administrative work.   FAMILY HISTORY:  Positive for diabetes, coronary artery disease and end-  stage renal disease.   OBJECTIVE:  VITALS:  Temperature is 97.9, pulse 80, respirations 18, BP  149/90.  HEAD:  Normocephalic, atraumatic.  NECK:  Supple without bruits.  LUNGS:  Clear to auscultation.  HEART:  Regular rate and rhythm.  NEUROLOGIC:  On the stroke scale neurologic exam, she is alert.  She  answers questions correctly.  She follows commands correctly.  Gaze is  full.  Visual fields are full.  There is no extinction.  There is normal  facial sensation.  There is normal facial motor activity.  There is no  drift in either upper or lower extremity with good grips.  There is no  ataxia on finger-to-nose or heel-to-shin.  Sensory is mildly diminished  to pinprick on the left arm and leg.  There is no evidence of an  aphasia, no dysarthria, no extinction.  She scores a 1 on the NIH stroke  scale based on the mild decreased sensation in the left upper and lower  extremity.   MRI of the brain suggests that a teeny-tiny infarct in the right  subcortical parietal white matter, barely discernible at all on the  diffusion-weighted image; if it were not for a small area of  hypointensity in the same location on ADC map, I would not have called  this at all.  She has otherwise minimal disease in the white matter.   MRA, intracranial, is very poor quality.  It looks like there may be  some vertebrobasilar disease, but it is just a very bad study.   IMPRESSION:  1. Syncope without a lot of neurological symptoms to suggest      vertebrobasilar insufficiency, but the vertebrobasilar system is      poorly visualized on the head MRA.  2. Minuscule right parietal infarct, hard to see, hard to believe it      would be associated with widespread  left-sided pain and numbness;      it is not related to the syncope.   PLAN AND RECOMMENDATIONS:  I recommend MRA of the neck with contrast to  see whether the vertebrobasilar disease is significant.  She should have  carotid Doppler and 2-D echo.  I would consider Aggrenox if she was on  aspirin at the time of onset of symptoms.      Catherine A. Orlin Hilding, M.D.  Electronically Signed     CAW/MEDQ  D:  04/28/2007  T:  04/29/2007  Job:  [id number]

## 2010-10-01 NOTE — H&P (Signed)
NAMECHARLETTE, [name]              ACCOUNT NO.:  [id number]   MEDICAL RECORD NO.:  [id number]          PATIENT TYPE:  INP   LOCATION:  1824                         FACILITY:  [facility]   PHYSICIAN:  Marcellus Scott, MD     DATE OF BIRTH:  [birth year]   DATE OF ADMISSION:  04/12/2007  DATE OF DISCHARGE:                              HISTORY & PHYSICAL   PRIMARY CARE PHYSICIAN:  [name], M.D.   CARDIOLOGIST:  [name], MD, [facility].   GASTROENTEROLOGY:  [name], M.D.   CHIEF COMPLAINT:  Syncope.   HISTORY OF PRESENT ILLNESS:  The patient is a pleasant 55 year old  African-American female patient with an extensive past medical history  as indicated below.  She was recently admitted to the hospital and was  discharged on November 6.  She was then admitted for chest pain and had  a coronary angiogram which showed diffuse coronary artery disease and  recommended for medical management by cardiology.  Since then she has  returned to the ER one more time for chest pain.   In any case, the patient was in her usual state of health until  yesterday when she had problems with constipation.  She took some  laxatives.  However, this morning she did not have any relief and had a  bowel movement with minimal hard stool.  She still had some stool in the  rectum and had pain and discomfort in that region.  The patient  subsequently presented to the emergency room.  She had manual fecal  disimpaction following which her rectal pain improved and so did her  left lower quadrant abdominal pain.  She subsequently went to the  cardiologist's office to have her scheduled appointment postponed.  While there and standing at the secretary's desk, the patient felt  lightheaded, dizzy, and then passed out.  The patient was helped to the  floor and did not sustain a full fall.  The patient denied any preceding  headache, chest pain, or palpitations.  The patient denies full loss  of  consciousness and says she was aware of what was happening around.  Please refer to cardiologist office notes for further details.  In any  case, she was found to have low blood pressure of 95/78 and 95/72 in  right and left arm and heart rate of 120.  Her blood sugars were in the  270s.  The patient subsequently was started on IV fluids and was  transferred to the emergency room for further evaluation.   The patient currently has intermittent dizziness and lightheadedness,  but significantly better than before.  The patient also complains of  twinges of abdominal pain in the left lower quadrant intermittently.  She, however, has not had any further bowel movements and no nausea and  vomiting or fevers or chills.  The patient's rectal pain is  significantly improved.   PAST MEDICAL HISTORY:  1. Type 2 diabetes.  2. Hypertension.  3. Dyslipidemia.  4. Obesity.  5. Gastroesophageal reflux disease.  6. Coronary artery disease diffuse, for medical management.  7. Left  ventricular ejection fraction 40-45%.  8. Tobacco abuse.   PAST SURGICAL HISTORY:  1. Cholecystectomy.  2. Multiple toe surgeries.   ALLERGIES:  PENICILLIN.   CURRENT MEDICATIONS:  Same as discharged on November 6, including:  1. Amaryl 4 mg twice daily.  2. Metformin 1000 mg twice daily.  3. Nexium 40 mg daily.  4. Lisinopril 10 mg daily.  5. Hydrochlorothiazide 25 mg daily.  6. Simvastatin 20 mg at bedtime.  7. Aspirin 81 mg daily.  8. IMDUR 15 mg daily.  9. Fish Oil 2 grams daily.  10.Coreg 3.125 mg twice daily.   FAMILY HISTORY:  1. Father with history of diabetes and prosthetic leg.  2. Sister with history of diabetes.  3. Sister with history of colostomy.   SOCIAL HISTORY:  The patient is single.  She works as a Optometrist.  The patient continues to smoke two cigarettes per day and  she has been a smoker for the last 36 years.  She occasionally consumes  beer.   REVIEW OF SYSTEMS:  14  systems reviewed and apart from the history of  presenting illness is pertinent for:  1. She has intermittent right lower extremity cramping, but no      swelling or pain.  2. Chronic low back pain.   PHYSICAL EXAMINATION:  GENERAL:  The patient is a moderately built and  nourished female patient in no obvious distress.  VITAL SIGNS:  Temperature 98.1 degrees Fahrenheit, blood pressure lying  102/71, sitting 128/87, standing 137/67, heart rate 111 beats per minute  and regular, respirations 20 per minute, saturating at 98% on room air.  HEENT:  Normocephalic and atraumatic.  Pupils equal, round, and reactive  to light and accommodation.  Right mature cataract.  Oral cavity with no  oropharyngeal erythema or thrush.  Mildly dry mucosa.  NECK:  Without JVD, carotid bruit, lymphadenopathy, goiter.  Supple.  LYMPHATICS:  No lymphadenopathy.  LUNGS:  Clear to auscultation bilaterally.  CARDIOVASCULAR:  First and second heart sounds heard.  No third or  fourth heart sounds or murmurs.  ABDOMEN:  Obese.  Diffuse minimal tenderness, but no rigidity, guarding,  rebound.  Bowel sounds are normally heard. No organomegaly or mass felt.  NEUROLOGY:  The patient is awake, alert and oriented x3.  No focal  neurological deficits.  EXTREMITIES:  No cyanosis, clubbing, or edema.  Peripheral pulses are  symmetrically felt, but left dorsalis pedis is feeble.  SKIN:  Without rashes.  MUSCULOSKELETAL:  Minimal tenderness across the lower back.   LABORATORY DATA:  D-dimer 0.55.  Point of care cardiac markers  remarkable for myoglobin of 208, otherwise negative.  H&H on ISTAT is  16.7 and 49.  Electrolytes remarkable for sodium 133, potassium 3.4,  chloride 101, glucose 246, BUN 25, creatinine 1.3.  Fecal occult blood  done this morning was negative.  Creatinine on November 13, was 1.07.   CT angiogram of the chest is negative for pulmonary embolism.  EKG with  sinus tachycardia with nonspecific T wave  abnormalities.   ASSESSMENT:  1. Syncope.  Etiology probably hypotension versus vasovagal.  The      hypotension was probably secondary to dehydration secondary to      thiazide diuretics and poor oral intake.  We will admit the patient      to telemetry.  We will cycle cardiac enzymes.  We will hydrate      intravenously and hold the patient's hydrochlorothiazide and  lisinopril temporarily.  We will also obtain carotid Dopplers for      completion.  2. Abdominal pain, unclear etiology.  We will repeat x-ray of the      abdomen flat and upright and obtain serum lipase.  We will place on      clear liquids orally and IV hydrate the patient.  We will provide      pain medications and monitor closely.  3. Diabetes.  We will continue Amaryl but will hold Metformin.  We      will place on sliding scale insulin.  4. Hypertension.  We will hold hydrochlorothiazide and lisinopril, but      continue Coreg.  5. Coronary artery disease.  We will continue aspirin, IMDUR, and      Coreg.  We will cycle cardiac enzymes.  6. Hyponatremia and hypokalemia secondary to hydrochlorothiazide.  We      will hold hydrochlorothiazide and replete potassium and hydrate      with normal saline.      Marcellus Scott, MD  Electronically Signed     AH/MEDQ  D:  04/12/2007  T:  04/12/2007  Job:  [id number]   cc:   [name], M.D.  [name] Daleen Squibb, MD, [facility]  [name], M.D.

## 2010-10-01 NOTE — Consult Note (Signed)
NAME:  [name], [name]              ACCOUNT NO.:  [id number]   MEDICAL RECORD NO.:  [id number]          PATIENT TYPE:  INP   LOCATION:  4715                         FACILITY:  [facility]   PHYSICIAN:  Casimiro Needle L. Reynolds, M.D.DATE OF BIRTH:  Oct 09, 1955   DATE OF CONSULTATION:  DATE OF DISCHARGE:                                 CONSULTATION   REASON FOR EVALUATION:  Syncope.   HISTORY OF PRESENT ILLNESS:  This is a new patient consultation and  evaluation of this existing [facility] patient, a 55-  year-old woman seen in the past, most recently by Dr. Orlin Hilding in  December 2008 for precisely this problem.  The patient has had several  bouts of syncope, last of which occurred in January.  She has several  known medical problems including hypertension, diabetes with known  polyneuropathy, history of coronary artery disease, and an incidental  finding back in December of an incredibly small stroke for which her  anticoagulant therapy was switched from aspirin to Aggrenox.   She most recently underwent implantation of a loop recorder.  Today, she  states that she was outside at work and was walking back in with some co-  workers.  She states that she did not get particularly hot outside, and  that she felt okay outside but when she get back inside, began to feel  little bit lightheaded.  According to witness reports, she then slid  down the side of the wall into more or less a seated position.  It is  not clear if she ever really got supine until she was seen by the EMS.  She recalls waking up after sliding down the wall and feeling  lightheaded, but being alert and able to engage in conversation.  States  that her speech and vision were normal at that time.  Her hands were  tingling but she had no focal symptoms.  She was taken by EMS to the  hospital where she states she continues to feel little bit lightheaded  and has a dull headache but other than that is feeling well.   She states  that she is feeling a little bit more lightheaded than usual for the  past few days, and has had a bit of a headache today.  She denies any  other upper respiratory symptoms except feeling a little bit stuffy.  She denies any nausea, vomiting, diarrhea, or bowel and bladder  difficulties.  She had little bit shortness of breath even today and has  some dyspnea with exertion, but denies any chest pain or palpitations  which she was aware.   PAST MEDICAL HISTORY:  As above.  She has 3-vessel coronary artery  disease and a known cardiomyopathy with ejection fraction of 30%-40%.  She has several known vascular risk factors including hypertension,  diabetes, hyperlipidemia, for which she is on a variety of medications.  She states that she has been treated for her neuropathy with Neurontin  and Lyrica, most recently with Vicodin.   Family/social/review of systems per admission H&P.   MEDICATIONS:  Coreg, glyburide, lisinopril, metformin, Aggrenox b.i.d.,  ISDN,  Lyrica, simvastatin.   PHYSICAL EXAMINATION:  VITAL SIGNS:  Temperature 96.0, blood pressure.  169/81, pulse is 63, and respirations 18.  GENERAL:  This is a healthy-appearing woman, supine on hospital bed, in  no cute distress.  HEAD:  Cranium is normocephalic and atraumatic.  Oropharynx is benign.  NECK:  Supple without carotid or supraclavicular bruits.  Heart:  Regular rate and rhythm.  No murmurs.   Gross examination of mental status, she is awake and alert.  She is  fully oriented to time, place, and person.  Recent and remote memory are  intact.  Attention span and concentration span are normal throughout and  appropriate.  Speech is fluent and not dysarthric.  Mood is euthymic.  Affect is appropriate.  Funduscopic exam reveals a dense cataract in the  right eye.  Pupils equal and reactive.  Extraocular movements are full  without nystagmus.  Visual fields are full in the left eye.  She has  only white  perception in the right eye.  Hearing is intact.  Conversational speech.  Face, tongue, and palate move normally and  asymmetrically.  Motor:  Normal bulk and tone.  She has good strength in  all tested extremity muscles with little bit of give way in the lower  extremities related to pain.  Sensory:  She reports diminished light  touch sensation in the feet but double stimulation is intact.  Coordination:  Finger-to-nose found accurately.  Gait is deferred.  Reflexes are diminished in the lower extremities.  Toes are downgoing  bilaterally.   LABORATORY REVIEW:  CBC:  White count 8.3, hemoglobin 12.9, and  platelets unavailable.  Chemistries were normal except for glucose of  145.  Cardiac enzymes were negative.  Chest x-ray is unremarkable.  She  had an MRI of the brain on April 28, 2007, first time a very  questionable tiny stroke in the right frontal area was read out along  with extensive small vessel disease.  She had an MRA of the neck on  April 29, 2008, which demonstrated good flow in the carotids, in the  left vertebral, and the dimunitive right vertebral.  She had an EEG on  April 27, 2008, and that study was normal.  She has had 2  echocardiograms which demonstrate EF of 35%-40% without focal wall  motion abnormalities.  She had a Doppler study in November 2008, which  was normal.   IMPRESSION:  Syncope likely secondary to diabetic neuropathy with  intermittent orthostasis and blood pressure lability.  She had an  extensive recent neurological work up which is negative.   RECOMMENDATIONS:  Consider trials of TED hose, possibly Florinef 0.5 mg  daily, and possibly midodrine.  I do not think any further workup is  going to alter her management.      Michael L. Thad Ranger, M.D.  Electronically Signed     MLR/MEDQ  D:  09/20/2007  T:  09/21/2007  Job:  [id number]

## 2010-10-01 NOTE — H&P (Signed)
[name], [name]              ACCOUNT NO.:  [id number]   MEDICAL RECORD NO.:  [id number]          PATIENT TYPE:  INP   LOCATION:  1823                         FACILITY:  [facility]   PHYSICIAN:  [name], M.D.   DATE OF BIRTH:  [birth year]   DATE OF ADMISSION:  03/23/2007  DATE OF DISCHARGE:                              HISTORY & PHYSICAL   CHIEF COMPLAINT:  Chest pain and epigastric pain.   HISTORY OF PRESENT ILLNESS:  The patient is a 55 year old African-  American female that presented to the ED with chest pain.  She stated  that yesterday she started feeling really dizzy with headache.  She went  home from work, then she started getting some chest pain for about a day  now.  The chest pain is more in the center of her chest.  It radiates up  across her chest to the left side.  Episode lasted for about a few  minutes, then resolved.  She also complains of pain in her back.  She  also complains of nausea and some diaphoresis with the pain that also  has resolved.  She also complains of pain in the epigastric area as well  and tenderness and increased burping when she eats.   PAST MEDICAL HISTORY:  Past medical history significant for:  1. Diabetes.  2. Hypertension.  3. Dyslipidemia.  4. GERD.  5. Obesity.  6. Cataracts.   FAMILY HISTORY:  Mother died, she had pneumonia and end-stage renal  disease, was 65 years old.  Father is 16, he has diabetes, heart disease  and he is status post amputation.  Three siblings, one with a colostomy,  not sure of the etiology of her gastric disease.   SOCIAL HISTORY:  The patient is single.  She smokes about a pack a day  over 30 years.  She drinks beers, several beers on Sundays.  She works  for an [employer], forensic as a Pension scheme manager.   MEDICATIONS:  1. Amaryl 4 mg twice daily.  2. Metformin 1000 mg twice daily.  3. Nexium 40 mg daily.  4. Lisinopril 10 mg daily.  5. HCTZ 25 mg daily.  6. Simvastatin 20 mg at bedtime.   ALLERGIES:  ALLERGIES TO PENICILLIN.   REVIEW OF SYSTEMS:  As per stated in the HPI.   PHYSICAL EXAMINATION:  VITAL SIGNS:  Temperature of 97.1.  Blood  pressure of 137/69.  Pulse of 70.  Respirations 24.  Pulse oximetry 96%  on room air.  HEENT:  Normocephalic, atraumatic.  Pupils are reactive to light.  Throat without erythema.  CARDIOVASCULAR:  Regular rate and rhythm.  LUNGS:  Clear bilaterally.  ABDOMEN:  Positive bowel sounds.  Soft, tender in the epigastric area,  severe, but diffuse abdominal tenderness.  EXTREMITIES:  Without edema.   LABORATORY DATA:  D-dimer 0.4.  WBC 11.6, hemoglobin 14.6, MCV 84,  platelets of 273,000.  Sodium 134, potassium 4.4, chloride 97, CO2 of  24, glucose 191, BUN 18, creatinine 0.82, AST 19, ALT 23, albumin of  3.7.  EKG shows first-degree heart block.  Chest x-ray shows no  acute  disease.   ASSESSMENT/PLAN:  1. Atypical chest pain:  This pain is atypical.  I am not sure if this      is heart-related versus gastrointestinal.  Because she has multiple      risk factors for heart disease, we will admit her to telemetry.      Rule out myocardial infarction.  Get a cardiology consult.  We will      also put her on proton pump inhibitor b.i.d. since she does have      some epigastric tenderness.  We will get a 2D echocardiogram and      continue discussion of chest pain.  We will get a 2D echocardiogram      as well.  2. Diabetes:  We will check her hemoglobin A1C.  Continue her on her      home medications and check Accu-Chek q.a.c. and q.h.s.  3. Hypertension:  Blood pressure looks good.  We will continue her on      her home medications.  4. Dyslipidemia:  We will check a fasting lipid panel.  Continue her      on Zocor at bedtime.      [name], M.D.  Electronically Signed     NJ/MEDQ  D:  03/23/2007  T:  03/23/2007  Job:  [id number]   cc:   [name], M.D.

## 2010-10-01 NOTE — Assessment & Plan Note (Signed)
[facility]                            CARDIOLOGY OFFICE NOTE   TANYA, CROTHERS                     MRN:          [id number]  DATE:06/16/2007                            DOB:          12/28/1955    [name] returns after discharge from the Hospital with recurrent  syncope.   Please see the discharge summary from May 26, 2007.   During her hospital stay she had extensive evaluation for unexplained  weight loss other than voluntary of about 127 pounds over the past year.  She had a colonoscopy which showed a few polyps some external  hemorrhoids and all biopsies came back negative per Dr. Ewing Schlein.  I do not  have report of that but that is what the patient tells me today.  She  also had extensive blood work as well as a 24-hour urine for  catecholamines and metanephrines, all of which were negative.  Her  plasma renin and aldosterone levels were normal as well.  Thyroid panels  were normal.  Her blood work overall looked unremarkable.   She ruled out for myocardial infarction.  She had a catheterization  which showed diffuse disease but medical management back in November  2008, her EF is 35-40%.   She had an EP study which showed no inducible VT or SVT.  A loop  recorder was placed by Dr. Ladona Ridgel.   She had no recurrent syncope.   Her current meds are Aggrenox one p.o. daily, Amitiza 24 mcg a day,  glimepiride 4 mg p.o. b.i.d., metformin 500 b.i.d., Neurontin 300 mg  p.o. b.i.d., Prevacid 30 mg a day, Coreg 3.125 b.i.d. which she is out  of, and Imdur 15 mg a day which he is out of.   The rest her problems are extensively listed in the discharge summary.   She is very pleasant lady in no acute distress.  Her blood pressure is 131/91, her pulse is 88 and regular.  Weight is  207.  She used to weigh well over 300 pounds.  HEENT:  Sclerae are injected and slightly muddy otherwise normal.  Dentition this is unsatisfactory.  Neck is  supple.  Carotids are equal bilateral bruits, no JVD.  Thyroid  is not enlarged.  LUNGS:  Clear.  HEART:  Reveals a poorly appreciated PMI.  She has large breasts.  Abdominal exam is soft, good bowel sounds.  Organomegaly could not be  assessed.  EXTREMITIES:  No edema.  Pulses are intact.  NEURO:  Exam is grossly intact.   On one of her scans she had a question of a left adrenal adenoma.  She  need to follow with Dr. Mikeal Hawthorne, her primary care physician, about this.  I have reinforced this today.   PLAN:  1. Renew her Carvedilol at 3.125 b.i.d.  2. Renew her Imdur generic 15 mg a day.  3. Continue other medications.  4. Follow with Dr. Mikeal Hawthorne with the adrenal adenoma.  5. Follow-up in our EP Clinic for loop monitor interrogation.      Scheduled for 3 months.  6. Follow with Dr. Ewing Schlein concerning biopsies  if not done so already  7. Aggressive risk factor modification she is doing.  8. Follow with [place] in 6 months.     [name] Daleen Squibb, MD, [facility]  Electronically Signed    TCW/MedQ  DD: 06/16/2007  DT: 06/17/2007  Job #: [id number]   cc:   [name], M.D.  [name], M.D.

## 2010-10-01 NOTE — H&P (Signed)
NAME:  [name], [name]              ACCOUNT NO.:  [id number]   MEDICAL RECORD NO.:  [id number]          PATIENT TYPE:  INP   LOCATION:  5502                         FACILITY:  [facility]   PHYSICIAN:  Hillery Aldo, M.D.   DATE OF BIRTH:  1956/03/23   DATE OF ADMISSION:  05/26/2007  DATE OF DISCHARGE:                              HISTORY & PHYSICAL   PRIMARY CARE PHYSICIAN:  [name], M.D.   CARDIOLOGIST:  [name], MD, [place] [facility]   CHIEF COMPLAINT:  Near syncope.   HISTORY OF PRESENT ILLNESS:  The patient is a 55 year old female with a  history of recurrent syncopal events dating back to November of 2008.  The patient has had 3 hospitalizations for evaluation of syncope.  Her  symptoms were initially felt to be vasovagal as the first episode was  precipitated by fecal disimpaction.  A subsequent event recorded in  December of 2008 occurred after urination.  During that admission the  patient had a full neurological evaluation including an MRI scan, MRA,  carotid Doppler ultrasonography, EEG and cervical spine films, all of  which were largely unrevealing as to potentially being the source of her  syncope.  She did have a small right parietal infarct noted on MRI and a  neurological consultation was requested at that time.  The patient was  put on Aggrenox but it was not felt that the infarct seen on the MRI  scan was contributory to her problems with syncope.  The patient now  presents with a recurring episode of dizziness that began suddenly after  standing while at work today.  She developed the sudden onset of blurry  vision at this time.  The symptoms did not resolve with sitting back  down.  She also reports an occipital headache that is still currently  present although the dizziness has largely resolved.  She denies any  associated diplopia.  She had some associated nausea but no vomiting.  She has some sharp left-sided chest pain which is also resolved  currently.  She  also complains of bilateral hand paresthesias and foot  paresthesias.  There was no slurring of her speech but she has noticed  some left-sided weakness though she cannot say how long that has been  present for.  Her last bowel movement was 3 days ago.  She occasionally  strains at her stool.  We are admitting her for consideration of a  cardiology evaluation with tilt testing.   PAST MEDICAL HISTORY:  1. Gastroesophageal reflux disease.  2. Hypertension.  3. Diabetes type 2.  4. Three vessel coronary artery disease by cardiac catheterization      November 2008 with diabetic cardiomyopathy and ejection fraction of      30-40%.  5. Hyperlipidemia.  6. Peripheral neuropathy.  7. 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  8. Chronic constipation.  9. Cataracts.  10.Status post cholecystectomy.  11.Status post bilateral fifth toe amputations.   FAMILY HISTORY:  The patient's mother died at 73 from complications of  pneumonia.  She also had end-stage renal disease, gallbladder cancer and  congestive heart failure.  The patient's father is alive with diabetes  and coronary artery disease.  He is in his 51s.  She has 1 brother and 2  sisters.  One of her sisters has diabetes and the other has had a  colostomy.   SOCIAL HISTORY:  The patient is single.  She is employed as a Optometrist.  She currently actively smokes 2 cigarettes per day but had a  heavier habit in the past, up to 2 packs per day for the past 36 years.  She occasionally drinks beer.   ALLERGIES:  PENICILLIN.   CURRENT MEDICATIONS:  1. Aggrenox 1 capsule b.i.d.  2. Amitiza 24 mcg daily.  3. Glimepiride 4 mg b.i.d.  4. Metformin 500 mg b.i.d.  5. Neurontin 300 mg b.i.d.  6. Prevacid 30 mg daily.  7. Simvastatin 20 mg daily.  8. Carvedilol 3.125 mg b.i.d.  9. Imdur 15 mg daily.   REVIEW OF SYSTEMS:  As noted in the elements of the HPI above, otherwise  negative.   PHYSICAL EXAM:  VITAL SIGNS:   Temperature 97.3, pulse 85, respirations  18, blood pressure 146/84, O2 saturation 99% on room air.  GENERAL:  Obese, black female who is in no acute distress.  HEENT:  Normocephalic, atraumatic.  PERRL.  EOMI.  The patient has an  opacification of the right lens and when visual fields are tested she  cannot adequately see out of the right eye.  Left visual fields are  intact.  Oropharynx is clear.  Tongue is midline.  Palate rises  symmetrically.  NECK:  Supple, no thyromegaly, no lymphadenopathy, no jugular venous  distention.  CHEST:  Lungs clear to auscultation bilaterally with good air movement.  HEART:  Regular rate and rhythm.  No murmurs, rubs, or gallops.  ABDOMEN:  Soft, nontender, nondistended with normoactive bowel sounds.  EXTREMITIES:  The patient has some bilateral calf tenderness.  Of note,  D-dimer is negative.  SKIN:  Warm and dry.  No rashes.  NEUROLOGICAL:  The patient is alert and oriented x3.  Cranial nerves II-  XII are grossly intact.  The patient has a very subtle weakness in her  left upper and lower extremity when compared to the right.   DATA REVIEW:  Chest x-ray and CT scanning of the head have been ordered  and are pending.   LABORATORY DATA:  White blood cell count is 8.3, hemoglobin 12.3,  hematocrit 37.2, platelets 185.  D-dimer is less than 0.22.  Sodium is  137, potassium 4.3, chloride 106, bicarb 27, BUN 13, creatinine 0.7,  glucose 188.  Point of care cardiac markers are negative x1.   ASSESSMENT AND PLAN:  1. Recurrent syncope.  The patient had a fairly extensive neurologic      evaluation on her previous admission.  She has known coronary      artery disease and diabetes which make autonomic neuropathy a      possibility.  The patient may also have vasovagal issues stemming      from micturition or defecation or straining at her stool.      Nevertheless, I think a tilt table test should be considered and we      will ask our cardiology  consultants to consider this and evaluate      her as to the appropriateness of this testing.  In the meantime, we      will monitor her hemodynamic status closely.  A set of orthostatic  vital signs has been obtained in the emergency department and does      not show any evidence of current orthostasis.  Because of concerns      regarding her gait stability she is being admitted to do the workup      as an inpatient.  2. Hypertension.  The patient's blood pressure is currently reasonably      controlled on her outpatient regimen which we will continue.  3. Diabetes.  The patient's glimepiride was increased during her last      hospitalization.  We will continue her usual home medications and      monitor her glycemic control to see if these need any adjustments.  4. Coronary artery disease.  The patient will be continued on      Aggrenox.  We will cycle cardiac enzymes q.8 h x3 and monitor her      on telemetry.  We will obtain a 12-lead EKG tracing.  5. Hyperlipidemia.  We will continue the patient's statin therapy.  6. Peripheral neuropathy.  The patient is currently on Neurontin.  We      will consider increasing this dose or a trial of Lyrica if she      continues to have significant neuropathic complaints.  7. Cerebrovascular disease.  We will continue the patient's Aggrenox      and control her risks as best we can.  8. Chronic constipation.  We will place the patient on a bowel regimen      to help prevent her from ongoing problems with constipation and      straining at stool.  9. Prophylaxis.  We will initiate Protonix for GI prophylaxis and      Lovenox for DVT prophylaxis.      Hillery Aldo, M.D.  Electronically Signed     CR/MEDQ  D:  05/26/2007  T:  05/26/2007  Job:  [id number]   cc:   [name] Daleen Squibb, MD, [facility]  [name], M.D.

## 2010-10-01 NOTE — Discharge Summary (Signed)
NAMEOASIS, GOEHRING              ACCOUNT NO.:  [id number]   MEDICAL RECORD NO.:  [id number]          PATIENT TYPE:  INP   LOCATION:  4715                         FACILITY:  [facility]   PHYSICIAN:  Veverly Fells. Excell Seltzer, MD  DATE OF BIRTH:  1955-10-05   DATE OF ADMISSION:  09/20/2007  DATE OF DISCHARGE:  09/21/2007                               DISCHARGE SUMMARY   PRIMARY CARDIOLOGIST:  Doylene Canning. Ladona Ridgel, MD   PRIMARY CARE Kamariya Blevens:  [name], MD   DISCHARGE DIAGNOSIS:  Syncope.   SECONDARY DIAGNOSES:  1. Hypertension.  2. Hyperlipidemia.  3. Diabetes.  4. Peripheral neuropathy.  5. Coronary artery disease with diffuse 3-vessel disease by      catheterization in 2008, which is medically managed.  6. Nonischemic cardiomyopathy, ejection fraction of 40% in December      2008.  7. Irritable bowel syndrome.  8. Hiatal hernia.  9. Gastroesophageal reflux disease.  10.Constipation.   ALLERGIES:  PENICILLIN.   PROCEDURES:  None.   HISTORY OF PRESENT ILLNESS:  A 55 year old [place] American female with  prior history of syncope status post loop recorder implantation on  May 31, 2007.  She presented to the [place] ED on Sep 20, 2007,  following a several day history of intermittent dizziness.  On the day  of admission, she was at work  and went outside to smoke a cigarette  when she had recurrent dizziness and then subsequent syncope.  This was  witnessed by co-workers.  When she came to, she complained of nausea and  a frontal headache.  She was evaluated by occupational health nurse and  the blood pressure was probably 180/105 and when the patient came to,  she was groggy and somewhat confused with complaints of nausea and  subsequently vomiting x2.  EMS was called and the patient was taken to  the [facility] ED.  In the ED, EKG showed no acute changes and cardiac  markers were negative.  Electrolytes and CBC were normal.  The patient  was admitted for further evaluation.   HOSPITAL COURSE:  Neurology was consulted and they felt this may be  secondary to autonomic neuropathy with intermittent orthostasis and  blood pressure lability.  They recommended a trial of  TED hose and  possibly Florinef or midodrine.  Of note, the patient's loop recorder  was evaluated in the emergency room and was unrevealing of any  significant brady or tachy arrhythmias.  With her history of  hypertension, we have opted not to initiate Florinef or midodrine at  this point.  We have arranged further followup with Dr. Ladona Ridgel on Oct 13, 2007.  The patient will be discharged home today in good condition.   DISCHARGE LABS:  Hemoglobin 12.9, hematocrit 38.8, WBC 8.3, platelets  were clumped, INR 0.9, sodium 140, potassium 3.6, chloride 104, CO2 28,  BUN 10, creatinine 0.58, glucose 94, total bilirubin 0.5, alkaline  phosphatase 103, AST 17, ALT 19, total protein 6.2, albumin 3.1, calcium  8.8, CK 39, MB 0.7, troponin I 0.01, total cholesterol 164,  triglycerides 127, HDL 34, and LDL 105.  DISPOSITION:  The patient is being discharged home today in good  condition.   FOLLOWUP PLANS AND APPOINTMENT:  We have arranged followup with Dr.  Ladona Ridgel on Oct 13, 2007, at 3:30 p.m.   DISCHARGE MEDICATIONS:  1. Metformin 500 mg b.i.d.  2. Carvedilol 3.125 mg b.i.d.  3. Isosorbide mononitrate 15 mg daily.  4. Glimepiride 4 mg daily.  5. Lisinopril 10 mg daily.  6. Lyrica 75 mg b.i.d.  7. Aggrenox b.i.d.  8. Simvastatin 20 mg nightly.   OUTSTANDING LABS STUDIES:  None.   DURATION OF DISCHARGE ENCOUNTER:  35 minutes including physician time.      [name], ANP      Veverly Fells. Excell Seltzer, MD  Electronically Signed    CB/MEDQ  D:  09/21/2007  T:  09/22/2007  Job:  [id number]   cc:   [name], M.D.

## 2010-10-01 NOTE — Discharge Summary (Signed)
[name], [name]              ACCOUNT NO.:  [id number]   MEDICAL RECORD NO.:  [id number]          PATIENT TYPE:  INP   LOCATION:  5502                         FACILITY:  [facility]   PHYSICIAN:  Hillery Aldo, M.D.   DATE OF BIRTH:  [birth year]   DATE OF ADMISSION:  2007-06-18  DATE OF DISCHARGE:                               DISCHARGE SUMMARY   DATE OF DISCHARGE:  Pending.   PRIMARY CARE PHYSICIAN:  [name], M.D.   CARDIOLOGIST:  Dr. Daleen Squibb.   GASTROENTEROLOGIST:  [name], M.D.   ELECTROPHYSIOLOGIST:  Dr. Graciela Husbands.   DISCHARGE DIAGNOSES:  1. Recurrent syncope of uncertain etiology, workup ongoing.  2. Diabetes mellitus.  3. Hyperlipidemia.  4. Peripheral neuropathy.  5. Cerebrovascular disease.  6. Chronic constipation.  7. Hypertension.  8. Coronary artery disease.  9. Abnormal weight loss, workup ongoing  10.Adrenal adenoma.   DISCHARGE MEDICATIONS:  Dictated at the time of actual discharge.   CONSULTATIONS:  1. Dr. Ewing Schlein of gastroenterology.  2. Dr. Graciela Husbands of electrophysiology  3. Dr. Myrtis Ser of cardiology.   BRIEF ADMISSION HPI:  The patient is a 55 year old female who presents  with recurrent episodes of syncope.  For full details, please see my  dictated report.  Of note, the patient had an extensive neurologic  evaluation on a previous presentation.   PROCEDURES AND DIAGNOSTIC STUDIES:  1. A CT of the head on 2007-06-18 showed stable generalized      volume loss with no acute intracranial abnormality.  2. Two views of the chest on 06/18/07 showed no active      cardiopulmonary disease.  3. A CT scan of the chest, abdomen and pelvis on May 27, 2007      showed no significant abnormality of the chest.  No significant      abnormality of the abdomen.  There was a left adrenal adenoma.  A      CT scan of the pelvis showed some soft fullness at the anus and      lower rectum to the left of midline possibly representing stool.  4.  Electrophysiology study with implantation of loop recorder on      May 31, 2007 showed no significant inducible V tach or      supraventricular tachycardia.  An implantable loop recorder was      placed.  5. Dopplers of the lower extremities on May 27, 2007 showed no      obvious evidence of DVT, superficial thrombosis or Baker's cyst      bilaterally.   DISCHARGE LABORATORY VALUES:  Dictated at the time of actual discharge.   HOSPITAL COURSE:  1. Presyncope.  The patient was admitted and a diagnostic workup was      undertaken.  Cardiology consultation was requested and kindly      provided by Dr. Myrtis Ser.  The patient has had extensive neurologic      evaluation. And therefore it was felt that with her history of      coronary artery disease and an ejection fraction of 40%, she may  need a further cardiology evaluation.  Although the spells appeared      to be vasovagal versus perhaps a autonomic neuropathy, cardiology      did proceed with an implantable loop recorder.  She not have any      evidence of inducible V tach or SVT on the EP study.  It was noted      during her evaluation that she had a significant history of weight      loss and, therefore, a malignancy workup was undertaken.  There      were some abnormalities noted on the CT scan including a left      adrenal adenoma and rectal fullness.  At this point, she is      undergoing further diagnostic testing to determine if the adrenal      adenoma is hyperfunctioning or malignant.  She is also undergoing      colonoscopy to rule out rectal cancer.  2. Diabetes:  The patient's diabetes has remained well-controlled      throughout her hospitalization.  3. Hyperlipidemia:  The patient's was maintained on statin therapy      throughout her hospitalization.  .  4. Peripheral neuropathy:  The patient's Neurontin was discontinued      and she was started on Lyrica workup for better neuropathic pain      control.  5.  Cerebrovascular disease:  The patient was continued on Aggrenox.      Her risk factors remained well controlled.  6. Chronic constipation:  The patient was put on a stool softener.      She was also given a bowel regimen which resulted in the      development of diarrhea.  At this point, she remains fairly regular      with stool softeners. .  7. Hypertension:  The patient's blood pressure has remained well-      controlled throughout her hospitalization.  8. Coronary artery disease:  The patient had cardiac enzymes cycled      q.8h. x3 sets that were completely negative.  9. Abnormal weight loss:  The patient has essentially lost over 100      pounds from 1 year ago.  She denies any significant changes in her      diet, although she does note that she has cut down on fried and      greasy foods.  A malignancy workup was undertaken.  Breast exam did      not reveal any masses, and the patient reports that she has regular      mammograms.  A rectal exam did not reveal any palpable rectal mass.      She has full colonoscopy on June 02, 2007, and we are waiting      her urinary and plasma metanephrine tests to evaluate the adrenal      adenoma.  10.Adrenal adenoma:  Again, we have tested serum and urinary      metanephrine's to further evaluate this.   DISPOSITION:  The patient will likely be medically stable for discharge  over the next 24-48 hours.      Hillery Aldo, M.D.  Electronically Signed     CR/MEDQ  D:  06/01/2007  T:  06/02/2007  Job:  [id number]   cc:   Duke Salvia, MD, [facility]  Jesse Sans. Daleen Squibb, MD, [facility]  [name], M.D.  [name], M.D.

## 2010-10-04 NOTE — Procedures (Signed)
[facility]  Patient:    [name], [name] Visit Number: [id number] MRN: [id number]          Service Type: END Location: ENDO Attending Physician:  Nelda Marseille Proc. Date: 02/25/01 Admit Date:  02/25/2001   CC:         Lindell Spar. Chestine Spore, M.D.   Procedure Report  PROCEDURE:  Esophagogastroduodenoscopy.  INDICATION:  Mid epigastric pain.  INFORMED CONSENT:  Consent was signed after risks, benefits, methods, and options thoroughly discussed in the office.  MEDICINES USED:  Demerol 60, Versed 6.  DESCRIPTION OF PROCEDURE:  The video endoscope was inserted by direct vision. The proximal and mid esophagus was normal.  In the distal esophagus was a small hiatal hernia.  The scope passed easily into the stomach.  Some yellow bilious material was suctioned and advanced through a normal antrum, normal pylorus, into a normal duodenal bulb and around the C-loop to a normal second portion of the duodenum.  The scope was slowly withdrawn back to the bulb, and a good look there ruled out ulcer in that location.  The scope was withdrawn back to the stomach and retroflexed.  Angularis, cardia, fundus, lesser and greater curve were all normal except for the hiatal hernia being confirmed in the cardia.  The scope was straightened, and straight visualization of the stomach was normal.  Air was suctioned, and the scope was slowly withdrawn. Again, a good look at the esophagus on slow withdrawal was normal. The scope was removed.  The patient tolerated the procedure well.  There was no obvious immediate complications.  ENDOSCOPIC DIAGNOSES: 1. Small hiatal hernia. 2. Otherwise negative esophagogastroduodenoscopy.  PLAN:  We will go ahead and double Nexium to see if that helps.  Follow up p.r.n. or in two months to recheck symptoms and decide any other work-up and plans. Attending Physician:  Nelda Marseille DD:  02/25/01 TD:  02/26/01 Job:  [phone] [id number]

## 2010-10-04 NOTE — Assessment & Plan Note (Signed)
[facility]                                 ON-CALL NOTE   JESLY, HARTMANN                     MRN:          [id number]  DATE:07/19/2007                            DOB:          1955-07-22    PATIENT NAME:  [name].  DOB:  [birth year]   Ms. Redler called in this morning because she has been having sharp  pains in her chest, which occur intermittently and briefly over the past  hour.  She has also had some left arm pain and left hand numbness.  These are pretty similar complaints to what she had in December of 2008  when she presented to the emergency department.  She does have a history  of coronary disease and I recommended she come to the emergency room for  evaluation.  She said it is not that bad, and that if it gets any worse,  then she will come to the emergency room.     [name], ANP  Electronically Signed    CB/MedQ  DD: 07/19/2007  DT: 07/19/2007  Job #: [id number]

## 2010-12-05 ENCOUNTER — Emergency Department (HOSPITAL_COMMUNITY): Payer: Managed Care, Other (non HMO)

## 2010-12-05 ENCOUNTER — Observation Stay (HOSPITAL_COMMUNITY)
Admission: EM | Admit: 2010-12-05 | Discharge: 2010-12-06 | DRG: 313 | Disposition: A | Discharge status: Home / Self Care | Payer: Managed Care, Other (non HMO) | Source: Ambulatory Visit | Attending: Internal Medicine | Admitting: Internal Medicine

## 2010-12-05 DIAGNOSIS — Z8673 Personal history of transient ischemic attack (TIA), and cerebral infarction without residual deficits: Secondary | ICD-10-CM | POA: Insufficient documentation

## 2010-12-05 DIAGNOSIS — E1142 Type 2 diabetes mellitus with diabetic polyneuropathy: Secondary | ICD-10-CM | POA: Insufficient documentation

## 2010-12-05 DIAGNOSIS — Z4509 Encounter for adjustment and management of other cardiac device: Secondary | ICD-10-CM | POA: Insufficient documentation

## 2010-12-05 DIAGNOSIS — R0789 Other chest pain: Principal | ICD-10-CM | POA: Insufficient documentation

## 2010-12-05 DIAGNOSIS — E1149 Type 2 diabetes mellitus with other diabetic neurological complication: Secondary | ICD-10-CM | POA: Insufficient documentation

## 2010-12-05 DIAGNOSIS — K219 Gastro-esophageal reflux disease without esophagitis: Secondary | ICD-10-CM | POA: Insufficient documentation

## 2010-12-05 DIAGNOSIS — I2510 Atherosclerotic heart disease of native coronary artery without angina pectoris: Secondary | ICD-10-CM | POA: Insufficient documentation

## 2010-12-05 DIAGNOSIS — E785 Hyperlipidemia, unspecified: Secondary | ICD-10-CM | POA: Insufficient documentation

## 2010-12-05 DIAGNOSIS — I10 Essential (primary) hypertension: Secondary | ICD-10-CM | POA: Insufficient documentation

## 2010-12-05 LAB — CARDIAC PANEL(CRET KIN+CKTOT+MB+TROPI)
Total CK: 66 U/L (ref 7–177)
Troponin I: <0.3 ng/mL (ref <0.30)

## 2010-12-05 LAB — CBC
HCT: 41.3 % (ref 36.0–46.0)
Hemoglobin: 13.7 g/dL (ref 12.0–15.0)
MCV: 83.9 fL (ref 78.0–100.0)
Platelets: 214 10*3/uL (ref 150–400)
RBC: 4.92 MIL/uL (ref 3.87–5.11)
WBC: 10.3 10*3/uL (ref 4.0–10.5)

## 2010-12-05 LAB — BASIC METABOLIC PANEL
Chloride: 100 mEq/L (ref 96–112)
Creatinine, Ser: 0.56 mg/dL (ref 0.50–1.10)
GFR calc Af Amer: >60 mL/min (ref >60)
Potassium: 4.2 mEq/L (ref 3.5–5.1)
Sodium: 137 mEq/L (ref 135–145)

## 2010-12-05 LAB — GLUCOSE, CAPILLARY: Glucose-Capillary: 298 mg/dL — ABNORMAL HIGH (ref 70–99)

## 2010-12-05 LAB — DIFFERENTIAL
Lymphocytes Relative: 25 % (ref 12–46)
Lymphs Abs: 2.5 10*3/uL (ref 0.7–4.0)
Monocytes Relative: 4 % (ref 3–12)
Neutro Abs: 7.2 10*3/uL (ref 1.7–7.7)
Neutrophils Relative %: 70 % (ref 43–77)

## 2010-12-05 LAB — CK TOTAL AND CKMB (NOT AT ARMC): Relative Index: INVALID (ref 0.0–2.5)

## 2010-12-05 LAB — TROPONIN I: Troponin I: <0.3 ng/mL (ref <0.30)

## 2010-12-05 MED ORDER — TECHNETIUM TC 99M TETROFOSMIN IV KIT
30.0000 | PACK | Freq: Once | INTRAVENOUS | Status: AC | PRN
  Administered 2010-12-05: 30 via INTRAVENOUS

## 2010-12-05 NOTE — H&P (Signed)
NAME:  [name], [name] NO.:  [id number]  MEDICAL RECORD NO.:  [id number]  LOCATION:  [facility]                         FACILITY:  [facility]  PHYSICIAN:  Andreas Blower, MD       DATE OF BIRTH:  Sep 08, 1955  DATE OF ADMISSION:  12/05/2010 DATE OF DISCHARGE:                             HISTORY & PHYSICAL   PRIMARY CARE PHYSICIAN:  [name], M.D.  PRIMARY CARDIOLOGIST:  [name], MD, [facility]  CHIEF COMPLAINT:  Chest pain.  HISTORY OF PRESENT ILLNESS:  Ms. Cuttino is a 55 year old African- American female with history of coronary artery disease, hypertension, type 2 diabetes, hyperlipidemia, history of TIAs, morbid obesity, who presents with the above complaints.  She reports that she was in her usual state of health; however, this morning at 8:30 a.m. while she was at work, sitting at her desk, eating her breakfast, she noted heavy pressure-like sensation in her chest.  She denies that the pain was radiating to her arms.  Denies any diaphoresis, but she did report that she felt like something heavy was on her chest.  She reports that she has been having some nausea with headaches with this episode but has not vomited.  Denies any recent fevers or chills.  Denies any vomiting. Denies any shortness of breath.  Denies any abdominal pain, diarrhea. Denies any vision changes.  Because of her history, she called EMS and was brought to the ER for further evaluation.  In the ER, patient received some morphine and her symptoms were improved.  She also received aspirin as well as nitroglycerin.  Given her history, the hospice service was asked to admit the patient for further management.  PAST MEDICAL HISTORY: 1. Hypertension. 2. Type 2 diabetes. 3. History of coronary artery disease, status post cardiac cath in     June 2010, which showed three-vessel coronary artery disease which     is predominantly small-vessel with diffuse disease. 4. History of GERD. 5. Hiatal  hernia. 6. Hyperlipidemia. 7. History of irritable bowel syndrome. 8. 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 9. History of TIAs. 10.Status post cholecystectomy. 11.Morbid obesity. 12.History of implantable loop recorder due to history of recurrent     unexplained syncope  REVIEW OF SYSTEMS:  Already mentioned.  All systems were reviewed with the patient was positive as per HPI, otherwise all other systems are negative.  SOCIAL HISTORY:  The patient smokes one pack of cigarettes over a week. Drinks 1 to 2 glasses of wine on the weekends.  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  FAMILY HISTORY:  Significant for father having heart problems, had diabetes, and pacemaker.  Her mother who has diabetes, currently is on dialysis.  HOME MEDICATIONS:  To be accurately reviewed by pharmacy but the patient reports that she is on: 1. Glimepiride 4 mg p.o. daily. 2. Lisinopril 20 mg p.o. daily. 3. Metformin 500 mg p.o. daily. 4. Simvastatin 40 mg p.o. q.h.s. 5. Aggrenox 25 mg 1 tablet p.o. daily.  PHYSICAL EXAMINATION:  VITAL SIGNS:  Temperature 97.6, blood pressure 146/82, heart rate 84, respirations 18, satting 100% on few liters of oxygen. GENERAL:  The patient was alert and oriented, did  not appear to be in acute distress, was lying in bed comfortably. HEENT:  Extraocular motions are intact.  Pupils equal, round.  Had moist mucous membranes. NECK:  Supple. HEART:  Regular with S1-S2. LUNGS:  Clear to auscultation bilaterally. ABDOMEN:  Soft, nontender, nondistended.  Positive bowel sounds. EXTREMITIES:  The patient had good peripheral pulses with 1 to 2+ lower extremity edema. NEURO:  Cranial nerves II through XII grossly intact.  Had 5/5 motor strength in upper as well as lower extremities.  RADIOLOGY/IMAGING: 1. The patient had abdominal x-ray which showed moderate stool     throughout the colon.  No findings of small-bowel obstruction or     free air. 2.  The patient had chest x-ray two-view which shows no acute     cardiopulmonary findings.  LABS:  CBC shows a white count of 10.3, hemoglobin 13.7, hematocrit 41.3, platelet count 212,000.  Electrolytes normal except BUN is 11, creatinine 0.56, glucose is 274.  Initial troponin less than 0.30.  ASSESSMENT/PLAN: 1. Atypical chest pain, will admit the patient and will rule her out     given her cardiac history.  The patient's initial troponin was     negative.  Will continue to trend troponins.  Given her cardiac     history, will start the patient on heparin.  If the troponins     negative x2, will discontinue the heparin and start the patient on     prophylactic Lovenox dose.  The patient reports that she has     implantable loop recorder that has not been interrogated given her     cardiac history, will have cardiology evaluate the patient to     to help to determine to see what futher workup the patient's needs     at this time. 2. Type 2 diabetes, will have her on moderate sliding scale insulin,     will hold metformin.  Will continue glimepiride; however, will hold     glimepiride if the patient is n.p.o. 3. Morbid obesity, manage as outpatient. 4. Hypertension.  Continue home medications withhold parameters. 5. History of coronary artery disease, management as above.  Will have     the patient on aspirin. 6. History of TIA.  Continue Aggrenox. 7. GERD.  Will have the patient on PPI. 8. Questionable constipation noted on abdominal x-ray, will have the     patient on MiraLax. 9. History of hiatal hernia.  Will have the patient on PPI. 10.Hyperlipidemia. Continue statin. 11.Prophylaxis, heparin for DVT prophylaxis.  If troponins negative     x2, will change heparin to Lovenox. 12.Code status, the patient is full code.  Time spent on admission, talking to the patient, consulting the family, and coordinating care was 1 hour.   Andreas Blower, MD   SR/MEDQ  D:  12/05/2010  T:   12/05/2010  Job:  [id number]  Electronically Signed by Wardell Heath Peg Fifer  on 12/05/2010 11:27:33 PM

## 2010-12-06 LAB — CARDIAC PANEL(CRET KIN+CKTOT+MB+TROPI)
Relative Index: INVALID (ref 0.0–2.5)
Total CK: 63 U/L (ref 7–177)
Troponin I: <0.3 ng/mL (ref <0.30)

## 2010-12-06 LAB — BASIC METABOLIC PANEL
GFR calc Af Amer: >60 mL/min (ref >60)
GFR calc non Af Amer: >60 mL/min (ref >60)
Potassium: 4.2 mEq/L (ref 3.5–5.1)
Sodium: 135 mEq/L (ref 135–145)

## 2010-12-06 LAB — LIPID PANEL
HDL: 38 mg/dL — ABNORMAL LOW (ref >39)
Triglycerides: 289 mg/dL — ABNORMAL HIGH (ref <150)

## 2010-12-06 LAB — CBC
Hemoglobin: 12.5 g/dL (ref 12.0–15.0)
MCHC: 32.1 g/dL (ref 30.0–36.0)
RDW: 15 % (ref 11.5–15.5)

## 2010-12-06 LAB — GLUCOSE, CAPILLARY: Glucose-Capillary: 99 mg/dL (ref 70–99)

## 2010-12-06 LAB — HEMOGLOBIN A1C: Mean Plasma Glucose: 335 mg/dL — ABNORMAL HIGH (ref <117)

## 2010-12-09 ENCOUNTER — Telehealth: Payer: Self-pay | Admitting: Internal Medicine

## 2010-12-09 ENCOUNTER — Encounter: Payer: Self-pay | Admitting: *Deleted

## 2010-12-09 NOTE — Telephone Encounter (Signed)
Pt at work and had to clock out until rtn to work note received, change fax to [phone] [employer], needs asap

## 2010-12-09 NOTE — Telephone Encounter (Signed)
RN s/w Pt re: work note. Pt was discharged on 12/07/2010 (loop recorder removed) and her job is requiring a note to return to work ASAP. (She was asked to punch out) Note should be faxed to: Attn: Tiana Loft. Fax# [phone].

## 2010-12-09 NOTE — Telephone Encounter (Signed)
Per pt call, pt needs note faxed to work regarding time missed while pt was in hospital July 19 and 20.    Use [phone] faxed to [employer]

## 2010-12-11 NOTE — Discharge Summary (Signed)
[name] [name]              ACCOUNT NO.:  [id number]  MEDICAL RECORD NO.:  [id number]  LOCATION:  3729                         FACILITY:  [facility]  PHYSICIAN:  Thad Ranger, MD       DATE OF BIRTH:  Jun 08, 1955  DATE OF ADMISSION:  12/05/2010 DATE OF DISCHARGE:  12/06/2010                        DISCHARGE SUMMARY - REFERRING   PRIMARY CARE PHYSICIAN:  [name], MD.  PRIMARY CARDIOLOGIST:  [name], MD, [facility]  DISCHARGE DIAGNOSES: 1. Atypical chest pain resolved. 2. Status post loop recorder removal. 3. Diabetes mellitus uncontrolled. 4. Hypertension. 5. History of coronary artery disease. 6. History of transient ischemic attack. 7. Gastroesophageal reflux disease.  CONSULTATIONS:  Cardiology [place].  BRIEF HISTORY OF PRESENT ILLNESS AT THE TIME OF ADMISSION:  [name] [name] is a 55 year old female with multiple medical problems as listed above reported that she was in her usual state of health, however, on morning of admission at 8:30 a.m., she was sitting at work, sitting at her desk, eating her breakfast and noted heavy pressure like sensation in her chest.  She denied that the pain was radiating to her arms.  Denied any diaphoresis but reported that she felt like something heavy on her chest.  Because of her history, she called EMS and was brought to the emergency room for further evaluation.  RADIOLOGICAL DATA:  Chest x-ray 2-view July 19, no acute cardiopulmonary process.  Abdominal series July 19, moderate stool in the colon, no findings of small bowel obstruction or free air.  Nuclear medicine stress test on July 19, nondiagnostic resting Myoview study.  There is attenuation of inferior wall which certainly may be due to diaphragmatic attenuation.  The inferior wall contracts fairly normally, cannot exclude ischemia on the study.  Left ventricular systolic function appears to be normal.  July 19, the patient also underwent removal of the implantable loop  recorder.  BRIEF HOSPITALIZATION COURSE:  [name] [name] is a 55 year old female who was admitted with atypical chest pain. 1. Atypical chest pain resolved.  Cardiac enzymes remained negative.     Cardiology was consulted given her cardiac history in the past.     She was ruled out for acute ACS.  Stress Myoview was done, however,     nondiagnostic with preserved EF function.  She was cleared to be     discharged by Cardiology Service, however, the implantable loop     recorder device was at end of life.  Hence she had removal of the     loop recorder prior to the discharge.  There were no immediate     procedure complications and implantable recorder was successfully     removed. 2. Diabetes mellitus.  The patient was placed on insulin while     inpatient.  Her HbA1c is 13.3 with mean plasma glucose of 335.  The     patient was given extensive diabetic and nutrition teaching.  She     is to follow up with Dr. [name] within 7 days to adjust her     medications and better glycemic control of her diabetes.  DISCHARGE MEDICATIONS: 1. Lisinopril 20 mg p.o. daily. 2. Aggrenox 25/200, 1 capsule p.o. daily. 3.  Simvastatin 40 mg at bedtime. 4. Metformin 1000 mg p.o. b.i.d. 5. Lantus 30 units subcu at bedtime. 6. Amaryl 4 mg p.o. q.a.m.  DISCHARGE FOLLOWUP:  With Dr. [name] within next 7-10 days and Dr. Valera Castle in 2 weeks.  DISCHARGE TIME:  30 minutes.     Thad Ranger, MD     RR/MEDQ  D:  12/07/2010  T:  12/07/2010  Job:  [id number]  cc:   [name] Daleen Squibb, MD, [facility] [name], M.D.  Electronically Signed by Andres Labrum Ryian Lynde  on 12/11/2010 12:59:39 PM

## 2010-12-17 ENCOUNTER — Encounter: Payer: Self-pay | Admitting: Physician Assistant

## 2010-12-23 ENCOUNTER — Encounter: Payer: Managed Care, Other (non HMO) | Admitting: Physician Assistant

## 2010-12-23 ENCOUNTER — Encounter: Payer: Managed Care, Other (non HMO) | Admitting: *Deleted

## 2010-12-27 NOTE — Op Note (Signed)
  NAMEEIZA, CANNIFF NO.:  [id number]  MEDICAL RECORD NO.:  [id number]  LOCATION:  3729                         FACILITY:  [facility]  PHYSICIAN:  Doylene Canning. Ladona Ridgel, MD    DATE OF BIRTH:  03-04-1956  DATE OF PROCEDURE:  12/06/2010 DATE OF DISCHARGE:  12/06/2010                              OPERATIVE REPORT   PROCEDURE PERFORMED:  Removal of implantable loop recorder.  INDICATIONS:  Status post loop insertion with device at end of life.  INTRODUCTION:  The patient is a 55-year woman with a history of unexplained syncope who underwent loop insertion approximately 3 years ago.  She was admitted to hospital yesterday with chest pain and ruled out for MI.  Because she was about to go home, it was deemed most appropriate to remove her loop recorder so she would not have come back to the hospital for an additional trip.  DESCRIPTION OF PROCEDURE:  After informed was obtained, the patient was taken to the diagnostic EP lab in a fasting state.  After usual preparation and draping, intravenous fentanyl and midazolam were given for sedation.  Lidocaine 30 mL was infiltrated over the left pectoral region.  A 3-cm incision was carried out over this region. Electrocautery was utilized to dissect down to the fascial plane.  The loop recorder pocket was entered with electrocautery.  Gentle traction was utilized to remove the loop recorder along with the silk suture attachment without difficulty.  The pocket was irrigated and electrocautery utilized to assure hemostasis.  The incision was closed with 2-0 and 3-0 Vicryl.  Benzoin and Steri-Strips were painted on the skin, a pressure dressing was applied, the patient was returned to her room in satisfactory condition.  COMPLICATIONS:  There were no immediate procedure complications.  RESULTS:  This demonstrates successful removal of an implantable loop recorder without immediate procedure complications.     Doylene Canning. Ladona Ridgel,  MD     GWT/MEDQ  D:  12/06/2010  T:  12/07/2010  Job:  [id number]  Electronically Signed by Lewayne Bunting MD on 12/27/2010 09:51:54 AM

## 2011-01-05 DIAGNOSIS — R079 Chest pain, unspecified: Secondary | ICD-10-CM

## 2011-01-06 DIAGNOSIS — R55 Syncope and collapse: Secondary | ICD-10-CM

## 2011-01-09 ENCOUNTER — Ambulatory Visit: Payer: Managed Care, Other (non HMO) | Admitting: Cardiology

## 2011-01-13 NOTE — Consult Note (Signed)
NAME:  [name], [name] NO.:  [id number]  MEDICAL RECORD NO.:  [id number]  LOCATION:  3729                         FACILITY:  [facility]  PHYSICIAN:  Duke Salvia, MD, FACCDATE OF BIRTH:  12/26/1955  DATE OF CONSULTATION:  12/05/2010 DATE OF DISCHARGE:                                CONSULTATION   PRIMARY CARE PHYSICIAN:  [name], MD  PRIMARY CARDIOLOGIST:  Jesse Sans. Daleen Squibb, MD, [facility]  ELECTROPHYSIOLOGIST:  Hillis Range, MD  CHIEF COMPLAINT:  Chest pain.  HISTORY OF PRESENT ILLNESS:  [name] is a 55 year old female with a history of nonobstructive coronary artery disease by cath in 2010.  She was admitted for chest pain in June 2011, but her pain was considered atypical and noncardiac.  She has had no chest pain since then until today.  This morning before 9 o'clock, [name] was sitting at her desk and getting ready to eat breakfast when she had onset of substernal chest pain.  She describes it as a pressure.  It does not radiate.  She had slight shortness of breath but no nausea, vomiting, or diaphoresis.  EMS was called and gave her aspirin 81 mg x4 as well as sublingual nitroglycerin x1.  Her chest pain decreased from an 8.5/10 to a 7/10. The nitro gave her a severe headache.  In the emergency room, she received 4 mg of morphine and 4 mg of Zofran.  This decreased her chest pain to a 4 or 5/10.  She has no history of exertional chest pain.  She walks on a treadmill occasionally, the last time about 2 weeks ago. When she walks on the treadmill, she does about 15 minutes without any symptoms.  Of note, she has abdominal pain, but states that she will not have a bowel movement unless she uses a laxative which she does about twice a week.  Her last bowel movement was about 5 days ago.  Other than that, she has had no recent acute illnesses or problems, although she has some chronic arthralgias and joint pain.  PAST MEDICAL HISTORY: 1. History of  chest pain status post cardiac catheterization in 2008,     and 2010, with the 2010, cath showing LAD 30%, circumflex 50%, OM     75% that was unchanged from 2008, ramus intermedius 60% with a     superior branch 80% (small vessel), dominant RCA 60%, an EF 45%-50%     with medical therapy recommended. 2. 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 3. Diabetes. 4. Hypertension. 5. Hyperlipidemia. 6. Morbid obesity. 7. Gastroesophageal reflux disease/irritable bowel syndrome. 8. Peripheral neuropathy. 9. 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 10.History of syncope, possibly orthostatic versus vasovagal status     post loop recorder. 11.History of cataracts.  PAST SURGICAL HISTORY:  She is status post cardiac catheterization x2 as well as cholecystectomy, bilateral toe surgery, right cataract removal, and an  electrophysiology study with a loop implantation in 2009.  ALLERGIES:  PENICILLIN.  CURRENT MEDICATIONS: 1. Amaryl 4 mg a day. 2. Lisinopril. 3. Metformin. 4. Zocor.  SOCIAL HISTORY:  She lives in [place] alone.  She works in an [employer], site as a Investment banker, operational.  She has a greater than 25 pack-year history of ongoing tobacco use, but says she smokes only a couple of cigarettes a day now.  She denies alcohol or drug abuse.  FAMILY HISTORY:  Her mother died at 83 with no heart disease and her father is alive in his late 1s with a history of coronary artery disease.  No siblings have heart disease.  REVIEW OF SYSTEMS:  She has chronic constipation and takes Dulcolax a couple of times a week.  She has chronic arthralgias and joint pains. She has abdominal pain secondary to the constipation.  She has not had melena.   She has not had fevers or chills.  She has had no recent episodes of presyncope or syncope.  She does not have palpitations. Full 14-point review of systems is otherwise negative except as stated in the HPI.  PHYSICAL EXAMINATION:  VITAL SIGNS:  Temperature is 97.6, blood pressure 140/71, pulse 75, respiratory rate 20, O2 saturation 100% on 2 L. GENERAL:  She is a well-developed, obese African American female in no acute distress, although she does continue to complain of chest pain. HEENT:  Normal. NECK:  There is no lymphadenopathy, thyromegaly, bruit, or JVD noted. CV:  Her heart is regular in rate and rhythm with an S1, S2 and no significant murmur, rub, or gallop is noted.  Distal pulses are intact in all four extremities. LUNGS:  Essentially clear to auscultation bilaterally. SKIN:  No rashes or lesions are noted. ABDOMEN:  Soft and she has active bowel sounds, but it is tender in the upper middle quadrant with less tenderness on the outer areas. EXTREMITIES:  There is no cyanosis or clubbing and she has 1+ edema. MUSCULOSKELETAL:  There are no joint deformity or effusions.  She has chest wall tenderness just to the left of the upper sternum.  There is no spine or CVA tenderness. NEUROLOGIC:  She is alert and oriented with cranial nerves II through XII grossly intact.  Chest x-ray, no acute disease.  EKG; sinus rhythm, rate 77 with inferior T-wave changes that are of unclear significance, but slightly different from an EKG dated June 2011.  LABORATORY VALUES:  Cardiac enzymes negative x1.  Hemoglobin 13.7, hematocrit 41.3, WBCs 10.3, platelets 214.  Sodium 137, potassium 4.2, chloride 100, CO2 of 25, BUN 11, creatinine 0.56, glucose 274.  IMPRESSION:  [name] was seen today by Dr. Graciela Husbands, the patient evaluated and the data reviewed.  IMPRESSION: 1. Chest pain:  Likely musculoskeletal with pain to palpation at the     costochondral junction. 2. Coronary artery disease,  moderate and diffuse. 3. Multiple cardiac risk factors including ongoing tobacco use,     hypertension, hyperlipidemia, and diabetes. 4. Morbid obesity. 5. Sleep disordered breathing. 6. EKG with minor ST changes in the inferior leads.  RECOMMENDATIONS: 1. NSAIDs. 2. Serial enzymes and if negative, pain medications, if positive cath.     Because she has had prolonged and ongoing pain, we will do a     Myoview pain study today. 3. Smoking cessation. 4. Fasting lipid profile. 5. Outpatient sleep study. 6. Consideration by Dr. Johney Frame of loop recorder removal as it has been     in past its battery  life.     Theodore Demark, PA-C   ______________________________ Duke Salvia, MD, [facility]    RB/MEDQ  D:  12/05/2010  T:  12/06/2010  Job:  [id number]  Electronically Signed by Theodore Demark PA-C on 12/06/2010 02:58:48 PM Electronically Signed by Sherryl Manges MD [facility] on 01/13/2011 01:49:56 PM

## 2011-02-05 ENCOUNTER — Encounter: Payer: Self-pay | Admitting: Cardiology

## 2011-02-06 LAB — HEPATIC FUNCTION PANEL
ALT: 17
AST: 10
Albumin: 3.5
Alkaline Phosphatase: 128 — ABNORMAL HIGH
Bilirubin, Direct: <0.1
Total Bilirubin: 0.6
Total Protein: 7.2

## 2011-02-06 LAB — URINALYSIS, ROUTINE W REFLEX MICROSCOPIC
Bilirubin Urine: NEGATIVE
Glucose, UA: NEGATIVE
Hgb urine dipstick: NEGATIVE
Ketones, ur: NEGATIVE
Nitrite: NEGATIVE
Protein, ur: NEGATIVE
Specific Gravity, Urine: 1.025
Urobilinogen, UA: 1
pH: 5.5

## 2011-02-06 LAB — POCT I-STAT CREATININE: Creatinine, Ser: 0.7

## 2011-02-06 LAB — UIFE/LIGHT CHAINS/TP QN, 24-HR UR
Alpha 1, Urine: DETECTED — AB
Beta, Urine: DETECTED — AB
Free Kappa Lt Chains,Ur: 0.59 (ref 0.04–1.51)
Gamma Globulin, Urine: DETECTED — AB
Time: 24

## 2011-02-06 LAB — BASIC METABOLIC PANEL
BUN: 10
Calcium: 9.1
Creatinine, Ser: 0.75
GFR calc non Af Amer: >60
Glucose, Bld: 63 — ABNORMAL LOW
Potassium: 3.9

## 2011-02-06 LAB — METANEPHRINES, PLASMA: Normetanephrine, Free: 0.43 (ref <0.90)

## 2011-02-06 LAB — CK TOTAL AND CKMB (NOT AT ARMC)
Relative Index: INVALID
Relative Index: INVALID
Total CK: 45

## 2011-02-06 LAB — T4, FREE: Free T4: 1.7

## 2011-02-06 LAB — POCT CARDIAC MARKERS: Troponin i, poc: <0.05

## 2011-02-06 LAB — CBC
MCHC: 33
Platelets: 213
RBC: 4.37
RDW: 15.4
WBC: 6.8
WBC: 8.3

## 2011-02-06 LAB — URINE MICROSCOPIC-ADD ON

## 2011-02-06 LAB — ALDOSTERONE + RENIN ACTIVITY W/ RATIO
Aldosterone, Serum: 4 ng/dL
Aldosterone/Renin Activity, Ratio: 8 Ratio (ref 1.5–18.2)
Renin Activity: 0.5 ng/mL/h

## 2011-02-06 LAB — CANCER ANTIGEN 19-9: CA 19-9: 6.9 — ABNORMAL LOW (ref <35.0)

## 2011-02-06 LAB — I-STAT 8, (EC8 V) (CONVERTED LAB)
Acid-Base Excess: 2
BUN: 13
Bicarbonate: 26.7 — ABNORMAL HIGH
HCT: 41
Hemoglobin: 13.9
Operator id: 198171
Sodium: 137

## 2011-02-06 LAB — TROPONIN I
Troponin I: 0.02
Troponin I: 0.02

## 2011-02-06 LAB — DIFFERENTIAL
Basophils Relative: 1
Lymphs Abs: 2
Monocytes Relative: 3
Neutro Abs: 5.9
Neutrophils Relative %: 71

## 2011-02-06 LAB — APTT: aPTT: 34

## 2011-02-06 LAB — SEDIMENTATION RATE: Sed Rate: 42 — ABNORMAL HIGH

## 2011-02-06 LAB — CEA: CEA: <0.5

## 2011-02-06 LAB — URIC ACID: Uric Acid, Serum: 4.4

## 2011-02-06 LAB — T3, FREE: T3, Free: 3.2 (ref 2.3–4.2)

## 2011-02-06 LAB — PROTIME-INR: INR: 0.9

## 2011-02-13 LAB — D-DIMER, QUANTITATIVE: D-Dimer, Quant: <0.22

## 2011-02-13 LAB — POCT I-STAT, CHEM 8
BUN: 15
Calcium, Ion: 1.06 — ABNORMAL LOW
Chloride: 105
Creatinine, Ser: 1
Glucose, Bld: 109 — ABNORMAL HIGH
HCT: 45
Hemoglobin: 15.3 — ABNORMAL HIGH
Potassium: 4.1
Sodium: 137
TCO2: 24

## 2011-02-13 LAB — CBC
HCT: 38.5
Hemoglobin: 13.4
MCHC: 34.7
MCV: 84.2
Platelets: 186
RBC: 4.58
RDW: 14.6
WBC: 9.6

## 2011-02-13 LAB — DIFFERENTIAL
Basophils Absolute: 0
Basophils Relative: 0
Eosinophils Absolute: 0
Eosinophils Relative: 0
Lymphocytes Relative: 25
Lymphs Abs: 2.4
Monocytes Absolute: 0.4
Monocytes Relative: 4
Neutro Abs: 6.8
Neutrophils Relative %: 71

## 2011-02-13 LAB — POCT CARDIAC MARKERS
CKMB, poc: <1 — ABNORMAL LOW
CKMB, poc: <1 — ABNORMAL LOW
Myoglobin, poc: 42.7
Myoglobin, poc: 46
Operator id: 295021
Operator id: 295021
Troponin i, poc: <0.05
Troponin i, poc: <0.05

## 2011-02-18 LAB — BASIC METABOLIC PANEL
BUN: 13
Chloride: 105
Creatinine, Ser: 0.59
Glucose, Bld: 221 — ABNORMAL HIGH
Potassium: 4.1

## 2011-02-18 LAB — DIFFERENTIAL
Lymphocytes Relative: 33
Lymphs Abs: 2.6
Monocytes Relative: 4
Neutrophils Relative %: 62

## 2011-02-18 LAB — CBC
HCT: 41.8
MCHC: 33.1
MCV: 85.5
Platelets: 191
RDW: 13.9
WBC: 7.8

## 2011-02-18 LAB — CK TOTAL AND CKMB (NOT AT ARMC): CK, MB: 1.2

## 2011-02-18 LAB — TROPONIN I: Troponin I: 0.01

## 2011-02-24 LAB — URINALYSIS, ROUTINE W REFLEX MICROSCOPIC
Glucose, UA: NEGATIVE
Glucose, UA: NEGATIVE
Hgb urine dipstick: NEGATIVE
Ketones, ur: NEGATIVE
Protein, ur: NEGATIVE
Protein, ur: NEGATIVE
Urobilinogen, UA: 0.2

## 2011-02-24 LAB — CARDIAC PANEL(CRET KIN+CKTOT+MB+TROPI)
CK, MB: 0.7
Relative Index: INVALID
Relative Index: INVALID
Total CK: 35
Total CK: 37
Troponin I: 0.02
Troponin I: 0.03

## 2011-02-24 LAB — TSH: TSH: 1.864

## 2011-02-24 LAB — CBC
HCT: 33.9 — ABNORMAL LOW
HCT: 34.3 — ABNORMAL LOW
HCT: 39.1
Hemoglobin: 12.9
MCHC: 34.1
MCV: 82.8
Platelets: 207
RBC: 4.1
RBC: 4.66
RDW: 13.5
WBC: 9.7

## 2011-02-24 LAB — POCT CARDIAC MARKERS
Myoglobin, poc: 67.2
Myoglobin, poc: 68.4
Operator id: 234501
Troponin i, poc: <0.05

## 2011-02-24 LAB — COMPREHENSIVE METABOLIC PANEL
AST: 15
Albumin: 3.5
Alkaline Phosphatase: 129 — ABNORMAL HIGH
BUN: 17
BUN: 17
CO2: 26
CO2: 28
Calcium: 8.3 — ABNORMAL LOW
Chloride: 97
Creatinine, Ser: 0.85
GFR calc Af Amer: >60
GFR calc non Af Amer: >60
Glucose, Bld: 209 — ABNORMAL HIGH
Potassium: 3.9
Total Bilirubin: 0.6
Total Bilirubin: 0.8

## 2011-02-24 LAB — DIFFERENTIAL
Eosinophils Relative: 1
Lymphocytes Relative: 28
Lymphs Abs: 2.7
Monocytes Absolute: 0.4
Monocytes Relative: 4

## 2011-02-24 LAB — D-DIMER, QUANTITATIVE: D-Dimer, Quant: 0.25

## 2011-02-24 LAB — BASIC METABOLIC PANEL
BUN: 10
GFR calc non Af Amer: >60
Glucose, Bld: 108 — ABNORMAL HIGH
Potassium: 3.9

## 2011-02-24 LAB — URINE MICROSCOPIC-ADD ON

## 2011-02-24 LAB — LIPID PANEL
HDL: 28 — ABNORMAL LOW
Total CHOL/HDL Ratio: 5.3
VLDL: 31

## 2011-02-24 LAB — I-STAT 8, (EC8 V) (CONVERTED LAB)
BUN: 24 — ABNORMAL HIGH
Chloride: 99
Glucose, Bld: 238 — ABNORMAL HIGH
pCO2, Ven: 46.5
pH, Ven: 7.415 — ABNORMAL HIGH

## 2011-02-24 LAB — RAPID URINE DRUG SCREEN, HOSP PERFORMED
Barbiturates: NOT DETECTED
Opiates: NOT DETECTED
Tetrahydrocannabinol: NOT DETECTED

## 2011-02-24 LAB — METANEPHRINES, URINE, 24 HOUR
Metaneph Total, Ur: 80 mcg/24 h — ABNORMAL LOW (ref 95–475)
Metanephrines, Ur: 18 mcg/24 h — ABNORMAL LOW (ref 19–140)
Volume, Urine-METAN: 1050

## 2011-02-24 LAB — TISSUE TRANSGLUTAMINASE, IGA: Tissue Transglutaminase Ab, IgA: 0.3 U/mL (ref <7)

## 2011-02-24 LAB — T4, FREE: Free T4: 1.42

## 2011-02-24 LAB — HOMOCYSTEINE: Homocysteine: 13.1

## 2011-02-24 LAB — CREATININE, SERUM: GFR calc Af Amer: >60

## 2011-02-24 LAB — CORTISOL: Cortisol, Plasma: 9.2

## 2011-02-24 LAB — RPR: RPR Ser Ql: NONREACTIVE

## 2011-02-25 LAB — CBC
HCT: 33.6 — ABNORMAL LOW
HCT: 39.8
HCT: 41.5
HCT: 43.4
Hemoglobin: 11.4 — ABNORMAL LOW
Hemoglobin: 13.7
Hemoglobin: 14.6
MCHC: 33.7
MCHC: 33.9
MCHC: 33.1
MCV: 83
MCV: 84.3
MCV: 84.9
MCV: 85.2
Platelets: 189
Platelets: 225
Platelets: 252
Platelets: 255
Platelets: 264
RBC: 4.05
RBC: 4.3
RBC: 4.78
RBC: 4.89
RDW: 13.4
RDW: 13
WBC: 15.1 — ABNORMAL HIGH
WBC: 8.5
WBC: 9.9

## 2011-02-25 LAB — POCT CARDIAC MARKERS
CKMB, poc: <1 — ABNORMAL LOW
Myoglobin, poc: 208
Myoglobin, poc: 68.5
Operator id: 198171

## 2011-02-25 LAB — I-STAT 8, (EC8 V) (CONVERTED LAB)
BUN: 25 — ABNORMAL HIGH
BUN: 22
Bicarbonate: 26.2 — ABNORMAL HIGH
Glucose, Bld: 246 — ABNORMAL HIGH
Glucose, Bld: 181 — ABNORMAL HIGH
Hemoglobin: 16 — ABNORMAL HIGH
Operator id: 294501
Potassium: 3.7
Sodium: 133 — ABNORMAL LOW
pCO2, Ven: 34.8 — ABNORMAL LOW
pH, Ven: 7.486 — ABNORMAL HIGH
pH, Ven: 7.488 — ABNORMAL HIGH

## 2011-02-25 LAB — LIPID PANEL
Cholesterol: 125
Cholesterol: 152
HDL: 36 — ABNORMAL LOW
HDL: 34 — ABNORMAL LOW
LDL Cholesterol: 59
LDL Cholesterol: 77
Total CHOL/HDL Ratio: 3.5
Triglycerides: 150 — ABNORMAL HIGH
Triglycerides: 204 — ABNORMAL HIGH

## 2011-02-25 LAB — BASIC METABOLIC PANEL
BUN: 19
BUN: 7
BUN: 13
BUN: 14
CO2: 23
CO2: 23
CO2: 26
CO2: 27
Calcium: 8.1 — ABNORMAL LOW
Calcium: 8.4
Chloride: 100
Chloride: 99
Creatinine, Ser: 0.98
Creatinine, Ser: 0.75
GFR calc Af Amer: >60
GFR calc Af Amer: >60
GFR calc non Af Amer: >60
Glucose, Bld: 114 — ABNORMAL HIGH
Glucose, Bld: 63 — ABNORMAL LOW
Glucose, Bld: 94
Potassium: 3.8
Potassium: 4.2
Potassium: 4.2
Potassium: 4.2
Sodium: 137
Sodium: 139
Sodium: 136

## 2011-02-25 LAB — OCCULT BLOOD X 1 CARD TO LAB, STOOL: Fecal Occult Bld: NEGATIVE

## 2011-02-25 LAB — DIFFERENTIAL
Basophils Absolute: 0
Basophils Absolute: 0.1
Basophils Relative: 0
Eosinophils Absolute: 0
Eosinophils Relative: 0
Eosinophils Relative: 1
Lymphocytes Relative: 27
Lymphs Abs: 2
Monocytes Absolute: 0.4
Monocytes Relative: 6
Monocytes Relative: 5
Neutro Abs: 12.2 — ABNORMAL HIGH
Neutro Abs: 5.6
Neutrophils Relative %: 81 — ABNORMAL HIGH

## 2011-02-25 LAB — COMPREHENSIVE METABOLIC PANEL
ALT: 23
AST: 19
AST: 24
Albumin: 3.5
Alkaline Phosphatase: 123 — ABNORMAL HIGH
Alkaline Phosphatase: 143 — ABNORMAL HIGH
BUN: 21
CO2: 24
Chloride: 95 — ABNORMAL LOW
Chloride: 97
Creatinine, Ser: 1.07
GFR calc Af Amer: >60
GFR calc Af Amer: >60
GFR calc non Af Amer: >60
Potassium: 3.9
Sodium: 134 — ABNORMAL LOW
Total Bilirubin: 0.3
Total Bilirubin: 0.6
Total Protein: 7.2

## 2011-02-25 LAB — APTT: aPTT: 34

## 2011-02-25 LAB — URINALYSIS, MICROSCOPIC ONLY
Glucose, UA: NEGATIVE
Hgb urine dipstick: NEGATIVE
Ketones, ur: NEGATIVE
Protein, ur: NEGATIVE
pH: 6

## 2011-02-25 LAB — URINALYSIS, ROUTINE W REFLEX MICROSCOPIC
Bilirubin Urine: NEGATIVE
Ketones, ur: 15 — AB
Protein, ur: 100 — AB
Urobilinogen, UA: 0.2

## 2011-02-25 LAB — CK TOTAL AND CKMB (NOT AT ARMC)
CK, MB: 1.1
CK, MB: 1.1
Relative Index: INVALID
Relative Index: INVALID
Total CK: 50
Total CK: 78
Total CK: 44

## 2011-02-25 LAB — TROPONIN I
Troponin I: 0.11 — ABNORMAL HIGH
Troponin I: 0.03
Troponin I: 0.06
Troponin I: 0.07 — ABNORMAL HIGH

## 2011-02-25 LAB — HEMOGLOBIN A1C
Hgb A1c MFr Bld: 8.1 — ABNORMAL HIGH
Mean Plasma Glucose: 211
Mean Plasma Glucose: 211

## 2011-02-25 LAB — TSH: TSH: 0.693

## 2011-02-25 LAB — URINE MICROSCOPIC-ADD ON

## 2011-02-25 LAB — POCT I-STAT CREATININE
Creatinine, Ser: 1.1
Operator id: 198171

## 2011-02-25 LAB — D-DIMER, QUANTITATIVE
D-Dimer, Quant: 0.55 — ABNORMAL HIGH
D-Dimer, Quant: 0.4

## 2011-02-25 LAB — LIPASE, BLOOD: Lipase: 25

## 2011-02-27 LAB — CBC
MCHC: 33.1
MCV: 86
RBC: 4.9
RDW: 14.4 — ABNORMAL HIGH

## 2011-02-27 LAB — DIFFERENTIAL
Basophils Absolute: 0.1
Basophils Relative: 1
Eosinophils Absolute: 0.1
Monocytes Relative: 4
Neutrophils Relative %: 68

## 2011-02-27 LAB — BASIC METABOLIC PANEL
BUN: 13
CO2: 26
Calcium: 9.2
Chloride: 104
Creatinine, Ser: 0.85
GFR calc Af Amer: >60
Glucose, Bld: 310 — ABNORMAL HIGH

## 2011-02-27 LAB — POCT CARDIAC MARKERS: Myoglobin, poc: 38.9

## 2011-05-28 ENCOUNTER — Encounter (HOSPITAL_COMMUNITY): Payer: Self-pay | Admitting: Emergency Medicine

## 2011-05-28 ENCOUNTER — Other Ambulatory Visit: Payer: Self-pay

## 2011-05-28 ENCOUNTER — Emergency Department (HOSPITAL_COMMUNITY)
Admission: EM | Admit: 2011-05-28 | Discharge: 2011-05-29 | Disposition: A | Discharge status: Home / Self Care | Payer: Managed Care, Other (non HMO) | Attending: Emergency Medicine | Admitting: Emergency Medicine

## 2011-05-28 DIAGNOSIS — IMO0001 Reserved for inherently not codable concepts without codable children: Secondary | ICD-10-CM | POA: Insufficient documentation

## 2011-05-28 DIAGNOSIS — K219 Gastro-esophageal reflux disease without esophagitis: Secondary | ICD-10-CM | POA: Insufficient documentation

## 2011-05-28 DIAGNOSIS — F17200 Nicotine dependence, unspecified, uncomplicated: Secondary | ICD-10-CM | POA: Insufficient documentation

## 2011-05-28 DIAGNOSIS — Z79899 Other long term (current) drug therapy: Secondary | ICD-10-CM | POA: Insufficient documentation

## 2011-05-28 DIAGNOSIS — I2510 Atherosclerotic heart disease of native coronary artery without angina pectoris: Secondary | ICD-10-CM | POA: Insufficient documentation

## 2011-05-28 DIAGNOSIS — B9789 Other viral agents as the cause of diseases classified elsewhere: Secondary | ICD-10-CM | POA: Insufficient documentation

## 2011-05-28 DIAGNOSIS — I10 Essential (primary) hypertension: Secondary | ICD-10-CM | POA: Insufficient documentation

## 2011-05-28 DIAGNOSIS — R079 Chest pain, unspecified: Secondary | ICD-10-CM | POA: Insufficient documentation

## 2011-05-28 DIAGNOSIS — E119 Type 2 diabetes mellitus without complications: Secondary | ICD-10-CM | POA: Insufficient documentation

## 2011-05-28 DIAGNOSIS — B349 Viral infection, unspecified: Secondary | ICD-10-CM

## 2011-05-28 DIAGNOSIS — Z794 Long term (current) use of insulin: Secondary | ICD-10-CM | POA: Insufficient documentation

## 2011-05-28 NOTE — ED Notes (Signed)
ekg preformed by emt r Iva Posten

## 2011-05-28 NOTE — ED Notes (Signed)
Pt c/o generalized body pain onset last pm.  Slight nausea no vomiting

## 2011-05-29 ENCOUNTER — Emergency Department (HOSPITAL_COMMUNITY): Payer: Managed Care, Other (non HMO)

## 2011-05-29 LAB — DIFFERENTIAL
Basophils Relative: 0 % (ref 0–1)
Eosinophils Absolute: 0 10*3/uL (ref 0.0–0.7)
Eosinophils Relative: 0 % (ref 0–5)
Lymphs Abs: 2.4 10*3/uL (ref 0.7–4.0)
Monocytes Relative: 4 % (ref 3–12)

## 2011-05-29 LAB — URINE MICROSCOPIC-ADD ON

## 2011-05-29 LAB — CBC
Hemoglobin: 14.1 g/dL (ref 12.0–15.0)
MCH: 28.5 pg (ref 26.0–34.0)
MCHC: 33.9 g/dL (ref 30.0–36.0)
MCV: 84 fL (ref 78.0–100.0)
Platelets: 216 10*3/uL (ref 150–400)
RBC: 4.95 MIL/uL (ref 3.87–5.11)

## 2011-05-29 LAB — URINALYSIS, ROUTINE W REFLEX MICROSCOPIC
Glucose, UA: >1000 mg/dL — AB
Ketones, ur: 15 mg/dL — AB
Protein, ur: 30 mg/dL — AB

## 2011-05-29 LAB — POCT I-STAT, CHEM 8
HCT: 45 % (ref 36.0–46.0)
Hemoglobin: 15.3 g/dL — ABNORMAL HIGH (ref 12.0–15.0)
Potassium: 4.1 mEq/L (ref 3.5–5.1)
Sodium: 136 mEq/L (ref 135–145)

## 2011-05-29 MED ORDER — SODIUM CHLORIDE 0.9 % IV BOLUS (SEPSIS)
500.0000 mL | Freq: Once | INTRAVENOUS | Status: AC
  Administered 2011-05-29: 1000 mL via INTRAVENOUS

## 2011-05-29 MED ORDER — KETOROLAC TROMETHAMINE 60 MG/2ML IM SOLN
30.0000 mg | Freq: Once | INTRAMUSCULAR | Status: AC
  Administered 2011-05-29: 60 mg via INTRAMUSCULAR
  Filled 2011-05-29: qty 2

## 2011-05-29 NOTE — ED Notes (Signed)
Pt tolerated drinking diet coke with out any complications

## 2011-05-29 NOTE — ED Notes (Signed)
Pt drinking oral liquids at this time without any problems.

## 2011-05-29 NOTE — ED Notes (Signed)
Pt st's she started having body aches last pm.  Denies nausea or vomiting, no diarrhea.  Pt alert and oriented x's 3, skin warm and dry, color appropriate.  Family at bedside.

## 2011-05-29 NOTE — ED Provider Notes (Addendum)
History     CSN: [id number]  Arrival date & time 05/28/11  2105   First MD Initiated Contact with Patient 05/29/11 0013      Chief Complaint  Patient presents with  . Generalized Body Aches    (Consider location/radiation/quality/duration/timing/severity/associated sxs/prior treatment) The history is provided by the patient.    Past Medical History  Diagnosis Date  . Coronary atherosclerosis of native coronary artery     non critical by cath 11/08 - med rx  . Secondary cardiomyopathy, unspecified   . Unspecified essential hypertension   . Other and unspecified hyperlipidemia   . Syncope and collapse     near-syncopal episode in November/2008  . Unspecified cardiovascular disease     Decreased left-ventricular fxn  . Unspecified cerebral artery occlusion with cerebral infarction   . Unspecified hereditary and idiopathic peripheral neuropathy   . Obesity, unspecified   . Esophageal reflux   . History of diabetes mellitus, type II   . Diaphragmatic hernia without mention of obstruction or gangrene   . Irritable bowel syndrome     Past Surgical History  Procedure Date  . Invasive electrophysiologic study 5/09    followed by insertion of an implantable loop recorder  . Colonoscopy   . Cholecystectomy   . Multiple toe surgeries   . Coronary angioplasty     Family History  Problem Relation Age of Onset  . Pneumonia Mother   . Gallbladder disease Mother     cancer  . Heart failure Mother   . Diabetes Father   . Coronary artery disease Father   . Stroke Neg Hx     History  Substance Use Topics  . Smoking status: Current Everyday Smoker -- 1.0 packs/day for 30 years    Types: Cigarettes  . Smokeless tobacco: Not on file  . Alcohol Use: Yes    OB History    Grav Para Term Preterm Abortions TAB SAB Ect Mult Living                  Review of Systems  Constitutional: Positive for fatigue. Negative for chills, activity change and appetite change.  HENT:  Positive for ear pain and rhinorrhea.   Respiratory: Positive for shortness of breath. Negative for cough and wheezing.   Cardiovascular: Negative for chest pain and leg swelling.  Gastrointestinal: Positive for nausea. Negative for abdominal pain.  Genitourinary: Negative for dysuria.  Neurological: Positive for weakness. Negative for dizziness.    Allergies  Penicillins  Home Medications   Current Outpatient Rx  Name Route Sig Dispense Refill  . ASPIRIN-DIPYRIDAMOLE 25-200 MG PO CP12 Oral Take 1 capsule by mouth daily.      Marland Kitchen GLIMEPIRIDE 4 MG PO TABS Oral Take 4 mg by mouth daily before breakfast.      . IBUPROFEN 800 MG PO TABS Oral Take 800 mg by mouth 3 (three) times daily.     . INSULIN GLARGINE 100 UNIT/ML [place] SOLN Subcutaneous Inject 15 Units into the skin at bedtime.      Marland Kitchen LISINOPRIL 10 MG PO TABS Oral Take 10 mg by mouth daily.      Marland Kitchen METFORMIN HCL 500 MG PO TABS Oral Take 500 mg by mouth 2 (two) times daily with a meal.    . SIMVASTATIN 20 MG PO TABS Oral Take 20 mg by mouth at bedtime.        BP 145/76  Pulse 86  Temp(Src) 97.7 F (36.5 C) (Oral)  Resp 20  SpO2  98%  Physical Exam  Constitutional: She is oriented to person, place, and time. She appears well-developed and well-nourished.  HENT:  Head: Normocephalic.  Eyes: Pupils are equal, round, and reactive to light.  Neck: Normal range of motion.  Cardiovascular: Normal rate.   Pulmonary/Chest: Effort normal. She has no wheezes. She exhibits no tenderness.  Abdominal: Soft. She exhibits no distension. There is no tenderness.  Musculoskeletal: Normal range of motion.  Neurological: She is alert and oriented to person, place, and time.  Skin: Skin is warm.    ED Course  Procedures (including critical care time)  Labs Reviewed  URINALYSIS, ROUTINE W REFLEX MICROSCOPIC - Abnormal; Notable for the following:    APPearance CLOUDY (*)    Glucose, UA >1000 (*)    Hgb urine dipstick SMALL (*)    Ketones, ur  15 (*)    Protein, ur 30 (*)    All other components within normal limits  CBC  DIFFERENTIAL  URINE MICROSCOPIC-ADD ON  I-STAT, CHEM 8   Dg Chest 2 View  05/29/2011  *RADIOLOGY REPORT*  Clinical Data: Mid chest pain  CHEST - 2 VIEW  Comparison: None.  Findings: Normal mediastinum and heart silhouette.  Costophrenic angles are clear.  No effusion, infiltrate, pneumothorax.Degenerative osteophytosis of the thoracic spine.  IMPRESSION: No acute cardiopulmonary process.  Original Report Authenticated By: Genevive Bi, M.D.     1. Viral syndrome       MDM  Viral illness        Arman Filter, NP 05/29/11 4098  Arman Filter, NP 06/25/11 2110

## 2011-05-29 NOTE — ED Notes (Signed)
Pt sleeping quietly in NAD.

## 2011-06-01 NOTE — ED Provider Notes (Signed)
Medical screening examination/treatment/procedure(s) were performed by non-physician practitioner and as supervising physician I was immediately available for consultation/collaboration.  Raeford Razor, MD 06/01/11 [phone]

## 2011-06-26 NOTE — ED Provider Notes (Signed)
Medical screening examination/treatment/procedure(s) were performed by non-physician practitioner and as supervising physician I was immediately available for consultation/collaboration.  Raeford Razor, MD 06/26/11 [phone]

## 2011-07-01 ENCOUNTER — Other Ambulatory Visit: Payer: Self-pay

## 2011-07-01 ENCOUNTER — Encounter (HOSPITAL_COMMUNITY): Payer: Self-pay | Admitting: Emergency Medicine

## 2011-07-01 ENCOUNTER — Inpatient Hospital Stay (HOSPITAL_COMMUNITY)
Admission: EM | Admit: 2011-07-01 | Discharge: 2011-07-03 | DRG: 074 | Disposition: A | Discharge status: Home / Self Care | Payer: Managed Care, Other (non HMO) | Attending: Internal Medicine | Admitting: Internal Medicine

## 2011-07-01 ENCOUNTER — Emergency Department (HOSPITAL_COMMUNITY): Payer: Managed Care, Other (non HMO)

## 2011-07-01 DIAGNOSIS — R42 Dizziness and giddiness: Secondary | ICD-10-CM

## 2011-07-01 DIAGNOSIS — E1149 Type 2 diabetes mellitus with other diabetic neurological complication: Principal | ICD-10-CM | POA: Diagnosis present

## 2011-07-01 DIAGNOSIS — R1110 Vomiting, unspecified: Secondary | ICD-10-CM

## 2011-07-01 DIAGNOSIS — R55 Syncope and collapse: Secondary | ICD-10-CM

## 2011-07-01 DIAGNOSIS — Z794 Long term (current) use of insulin: Secondary | ICD-10-CM

## 2011-07-01 DIAGNOSIS — E669 Obesity, unspecified: Secondary | ICD-10-CM

## 2011-07-01 DIAGNOSIS — I10 Essential (primary) hypertension: Secondary | ICD-10-CM | POA: Diagnosis present

## 2011-07-01 DIAGNOSIS — Z6838 Body mass index (BMI) 38.0-38.9, adult: Secondary | ICD-10-CM

## 2011-07-01 DIAGNOSIS — E876 Hypokalemia: Secondary | ICD-10-CM | POA: Diagnosis present

## 2011-07-01 DIAGNOSIS — E1140 Type 2 diabetes mellitus with diabetic neuropathy, unspecified: Secondary | ICD-10-CM | POA: Diagnosis present

## 2011-07-01 DIAGNOSIS — E1142 Type 2 diabetes mellitus with diabetic polyneuropathy: Secondary | ICD-10-CM | POA: Diagnosis present

## 2011-07-01 DIAGNOSIS — Z8673 Personal history of transient ischemic attack (TIA), and cerebral infarction without residual deficits: Secondary | ICD-10-CM

## 2011-07-01 DIAGNOSIS — F17200 Nicotine dependence, unspecified, uncomplicated: Secondary | ICD-10-CM | POA: Diagnosis present

## 2011-07-01 DIAGNOSIS — I2510 Atherosclerotic heart disease of native coronary artery without angina pectoris: Secondary | ICD-10-CM | POA: Diagnosis present

## 2011-07-01 DIAGNOSIS — N390 Urinary tract infection, site not specified: Secondary | ICD-10-CM | POA: Diagnosis present

## 2011-07-01 DIAGNOSIS — IMO0002 Reserved for concepts with insufficient information to code with codable children: Secondary | ICD-10-CM | POA: Diagnosis present

## 2011-07-01 DIAGNOSIS — E785 Hyperlipidemia, unspecified: Secondary | ICD-10-CM

## 2011-07-01 DIAGNOSIS — D649 Anemia, unspecified: Secondary | ICD-10-CM | POA: Diagnosis present

## 2011-07-01 DIAGNOSIS — E860 Dehydration: Secondary | ICD-10-CM | POA: Diagnosis present

## 2011-07-01 HISTORY — DX: Morbid (severe) obesity due to excess calories: E66.01

## 2011-07-01 HISTORY — DX: Cerebral infarction, unspecified: I63.9

## 2011-07-01 LAB — COMPREHENSIVE METABOLIC PANEL
ALT: 22 U/L (ref 0–35)
AST: 13 U/L (ref 0–37)
Albumin: 3.1 g/dL — ABNORMAL LOW (ref 3.5–5.2)
Alkaline Phosphatase: 173 U/L — ABNORMAL HIGH (ref 39–117)
BUN: 13 mg/dL (ref 6–23)
CO2: 25 mEq/L (ref 19–32)
Calcium: 8.9 mg/dL (ref 8.4–10.5)
Chloride: 102 mEq/L (ref 96–112)
Creatinine, Ser: 0.55 mg/dL (ref 0.50–1.10)
GFR calc Af Amer: >90 mL/min (ref >90)
GFR calc non Af Amer: >90 mL/min (ref >90)
Glucose, Bld: 385 mg/dL — ABNORMAL HIGH (ref 70–99)
Potassium: 3.7 mEq/L (ref 3.5–5.1)
Sodium: 138 mEq/L (ref 135–145)
Total Bilirubin: 0.3 mg/dL (ref 0.3–1.2)
Total Protein: 6.7 g/dL (ref 6.0–8.3)

## 2011-07-01 LAB — URINALYSIS, ROUTINE W REFLEX MICROSCOPIC
Bilirubin Urine: NEGATIVE
Glucose, UA: >1000 mg/dL — AB
Ketones, ur: 15 mg/dL — AB
Leukocytes, UA: NEGATIVE
Nitrite: NEGATIVE
Protein, ur: NEGATIVE mg/dL
Specific Gravity, Urine: 1.035 — ABNORMAL HIGH (ref 1.005–1.030)
Urobilinogen, UA: 0.2 mg/dL (ref 0.0–1.0)
pH: 5.5 (ref 5.0–8.0)

## 2011-07-01 LAB — DIFFERENTIAL
Basophils Absolute: 0 10*3/uL (ref 0.0–0.1)
Basophils Relative: 0 % (ref 0–1)
Eosinophils Absolute: 0.1 10*3/uL (ref 0.0–0.7)
Eosinophils Relative: 1 % (ref 0–5)
Lymphocytes Relative: 25 % (ref 12–46)
Lymphs Abs: 2.1 10*3/uL (ref 0.7–4.0)
Monocytes Absolute: 0.4 10*3/uL (ref 0.1–1.0)
Monocytes Relative: 5 % (ref 3–12)
Neutro Abs: 5.9 10*3/uL (ref 1.7–7.7)
Neutrophils Relative %: 70 % (ref 43–77)

## 2011-07-01 LAB — BLOOD GAS, VENOUS
Acid-base deficit: 0.7 mmol/L (ref 0.0–2.0)
Bicarbonate: 23.3 mEq/L (ref 20.0–24.0)
O2 Saturation: 95.1 %
Patient temperature: 98.6
TCO2: 20.7 mmol/L (ref 0–100)
pCO2, Ven: 37.8 mmHg — ABNORMAL LOW (ref 45.0–50.0)
pH, Ven: 7.406 — ABNORMAL HIGH (ref 7.250–7.300)
pO2, Ven: 72.4 mmHg — ABNORMAL HIGH (ref 30.0–45.0)

## 2011-07-01 LAB — CBC
HCT: 38.1 % (ref 36.0–46.0)
Hemoglobin: 12.5 g/dL (ref 12.0–15.0)
MCH: 27.5 pg (ref 26.0–34.0)
MCHC: 32.8 g/dL (ref 30.0–36.0)
MCV: 83.9 fL (ref 78.0–100.0)
Platelets: 192 10*3/uL (ref 150–400)
RBC: 4.54 MIL/uL (ref 3.87–5.11)
RDW: 14.4 % (ref 11.5–15.5)
WBC: 8.5 10*3/uL (ref 4.0–10.5)

## 2011-07-01 LAB — GLUCOSE, CAPILLARY
Glucose-Capillary: 326 mg/dL — ABNORMAL HIGH (ref 70–99)
Glucose-Capillary: 298 mg/dL — ABNORMAL HIGH (ref 70–99)

## 2011-07-01 LAB — URINE MICROSCOPIC-ADD ON

## 2011-07-01 LAB — TROPONIN I: Troponin I: <0.3 ng/mL (ref <0.30)

## 2011-07-01 MED ORDER — SODIUM CHLORIDE 0.9 % IV BOLUS (SEPSIS)
1000.0000 mL | Freq: Once | INTRAVENOUS | Status: AC
  Administered 2011-07-01: 1000 mL via INTRAVENOUS

## 2011-07-01 MED ORDER — ONDANSETRON HCL 4 MG/2ML IJ SOLN
4.0000 mg | Freq: Once | INTRAMUSCULAR | Status: AC
  Administered 2011-07-01: 4 mg via INTRAVENOUS

## 2011-07-01 MED ORDER — ONDANSETRON HCL 4 MG/2ML IJ SOLN
INTRAMUSCULAR | Status: AC
  Filled 2011-07-01: qty 2

## 2011-07-01 MED ORDER — LABETALOL HCL 5 MG/ML IV SOLN
20.0000 mg | Freq: Once | INTRAVENOUS | Status: AC
  Administered 2011-07-01: 20 mg via INTRAVENOUS
  Filled 2011-07-01 (×2): qty 4

## 2011-07-01 MED ORDER — ACETAMINOPHEN 325 MG PO TABS
650.0000 mg | ORAL_TABLET | Freq: Once | ORAL | Status: AC
  Administered 2011-07-01: 650 mg via ORAL
  Filled 2011-07-01: qty 2

## 2011-07-01 MED ORDER — INSULIN ASPART 100 UNIT/ML ~~LOC~~ SOLN
6.0000 [IU] | Freq: Once | SUBCUTANEOUS | Status: AC
  Administered 2011-07-01: 6 [IU] via INTRAVENOUS
  Filled 2011-07-01: qty 1

## 2011-07-01 MED ORDER — ONDANSETRON HCL 4 MG/2ML IJ SOLN
4.0000 mg | Freq: Once | INTRAMUSCULAR | Status: AC
  Administered 2011-07-01: 4 mg via INTRAVENOUS
  Filled 2011-07-01: qty 2

## 2011-07-01 MED ORDER — DEXTROSE 5 % IV SOLN
1.0000 g | Freq: Once | INTRAVENOUS | Status: AC
  Administered 2011-07-01: 1 g via INTRAVENOUS
  Filled 2011-07-01: qty 10

## 2011-07-01 MED ORDER — SODIUM CHLORIDE 0.9 % IV SOLN
Freq: Once | INTRAVENOUS | Status: AC
  Administered 2011-07-01: 17:00:00 via INTRAVENOUS

## 2011-07-01 MED ORDER — KETOROLAC TROMETHAMINE 30 MG/ML IJ SOLN
30.0000 mg | Freq: Once | INTRAMUSCULAR | Status: AC
  Administered 2011-07-01: 30 mg via INTRAVENOUS
  Filled 2011-07-01: qty 1

## 2011-07-01 NOTE — ED Notes (Signed)
EMS reported that pt was found beside desk at work due to sycopal episode. Pt does not recall event. Reports that room is spinning and she is dizzy at this time. EMS blood sugar was over 400.

## 2011-07-01 NOTE — ED Notes (Signed)
MVH:QIONG<EX> Expected date:07/01/11<BR> Expected time: 1:20 PM<BR> Means of arrival:Ambulance<BR> Comments:<BR> EMS 64 GC, 55 yof syncope w hyperglycemia

## 2011-07-01 NOTE — ED Notes (Signed)
Pt requesting to be admitted to the pt. States that she lives alone at home and still feels dizzy. Pt concerned that she will fall and hurt herself. Pt wants to know cause of the dizziness. Informed Dr. Juleen China of pt's request to be admitted.

## 2011-07-01 NOTE — ED Provider Notes (Signed)
Medical screening examination/treatment/procedure(s) were conducted as a shared visit with non-physician practitioner(s) and myself.  I personally evaluated the patient during the encounter  55yF with syncope. She has known 3-vessel coronary artery disease and a known cardiomyopathy. She has several known vascular risk factors including hypertension, diabetes, hyperlipidemia. Per review of records going back several years, pt has a history of recurrent syncope and actually has had neurology consults and loop recorder implantation for evaluation of this without clear etiology.  Most recent admit seems to be 11/2010 with CP. Evaluated by cardiology then and felt to be atypical. Had myoview stress during that admission which was nondiagnostic. Work-up today fairly unremarkable. EKG NSR with normal intervals and mild nondiagnostic ST changes. Questionable UTI which she received dose of ceftriaxone for. CT head without acute abnormality. Pt is afebrile and HD stable. She is requesting admission. Although I understand her concerns, given similarity of this episode to prior and extensive prior work-up without clear etiology, I suspect admission now is of little utility. Suspect some component of orthostasis and blood pressure lability. Pt received IVF bolus. Ambulated prior to DC and unable to do so without assistance.  Pt very unstable and needed two people to assist her and vomited shortly after. Repeat neuro exam not focal. Pt says feels dizzy but doesn't describe true vertigo. Given inability to ambulate and ongoing vomiting. Will admit for further eval.    Raeford Razor, MD 07/06/11 [phone]

## 2011-07-01 NOTE — ED Notes (Signed)
Per EMS pt from work with syncopal episode found lying on back next to her desk. Pt does not remember the incident but was not feeling well this am with dizziness.

## 2011-07-01 NOTE — ED Notes (Signed)
Pt very unsteady on her feet with two person assist. Only able to walk three steps before almost falling.

## 2011-07-01 NOTE — ED Notes (Signed)
Patient in xray. Will obtain labs upon patient's return

## 2011-07-01 NOTE — ED Provider Notes (Signed)
History     CSN: [id number]  Arrival date & time 07/01/11  1343   First MD Initiated Contact with Patient 07/01/11 1516      No chief complaint on file.   (Consider location/radiation/quality/duration/timing/severity/associated sxs/prior treatment) HPI Patient is 56 yo [place] American female with a history of DM-2 and HTN who is brought to the ED by EMS after a syncopal episode this morning at work. Patient reports that she felt nauseous this morning when she woke up. She went to work, and as she was walking back to her desk after going to the restroom and she felt the room to be spinning, and then remembers waking up on the floor. She does not recall if she hit her head. She reports that her blood glucose was 132 this am at home and when EMS check it it was over 400.  Since the syncopal episode she has vomited a few times, and denies vomiting blood. She states that yesterday she felt completely fine. She has not been ill, denies fevers, chills, sweats, chest pain, shortness of breath, abdominal pain. She denies changes in bowel and bladder habits, or blood in her stool or urine, and denies dysuria. She currently feels nauseous, has a headache, and feels dizzy.  She is allergic to  PCN.  Past Medical History  Diagnosis Date  . Coronary atherosclerosis of native coronary artery     non critical by cath 11/08 - med rx  . Secondary cardiomyopathy, unspecified   . Unspecified essential hypertension   . Other and unspecified hyperlipidemia   . Syncope and collapse     near-syncopal episode in November/2008  . Unspecified cardiovascular disease     Decreased left-ventricular fxn  . Unspecified cerebral artery occlusion with cerebral infarction   . Unspecified hereditary and idiopathic peripheral neuropathy   . Obesity, unspecified   . Esophageal reflux   . History of diabetes mellitus, type II   . Diaphragmatic hernia without mention of obstruction or gangrene   . Irritable bowel syndrome    . Diabetes mellitus     Past Surgical History  Procedure Date  . Invasive electrophysiologic study 5/09    followed by insertion of an implantable loop recorder  . Colonoscopy   . Cholecystectomy   . Multiple toe surgeries   . Coronary angioplasty     Family History  Problem Relation Age of Onset  . Pneumonia Mother   . Gallbladder disease Mother     cancer  . Heart failure Mother   . Diabetes Father   . Coronary artery disease Father   . Stroke Neg Hx     History  Substance Use Topics  . Smoking status: Current Everyday Smoker -- 1.0 packs/day for 30 years    Types: Cigarettes  . Smokeless tobacco: Not on file  . Alcohol Use: Yes    OB History    Grav Para Term Preterm Abortions TAB SAB Ect Mult Living                  Review of Systems All pertinent positives and negatives reviewed in the history of present illness  Allergies  Penicillins  Home Medications   Current Outpatient Rx  Name Route Sig Dispense Refill  . ACETAMINOPHEN 500 MG PO TABS Oral Take 1,000 mg by mouth every 6 (six) hours as needed. For pain    . ASPIRIN-DIPYRIDAMOLE 25-200 MG PO CP12 Oral Take 1 capsule by mouth daily.      Marland Kitchen GLIMEPIRIDE 4  MG PO TABS Oral Take 4 mg by mouth daily before breakfast.      . IBUPROFEN 800 MG PO TABS Oral Take 800 mg by mouth 3 (three) times daily.     . INSULIN GLARGINE 100 UNIT/ML [place] SOLN Subcutaneous Inject 15 Units into the skin at bedtime.      Marland Kitchen LISINOPRIL 10 MG PO TABS Oral Take 10 mg by mouth daily.      Marland Kitchen METFORMIN HCL 500 MG PO TABS Oral Take 500 mg by mouth 2 (two) times daily with a meal.    . SIMVASTATIN 20 MG PO TABS Oral Take 20 mg by mouth at bedtime.        BP 177/95  Pulse 95  Resp 22  SpO2 100%  Physical Exam  Constitutional: She is oriented to person, place, and time. She appears well-developed and well-nourished. No distress.  HENT:  Head: Normocephalic and atraumatic.  Mouth/Throat: Oropharynx is clear and moist. No  oropharyngeal exudate.  Eyes: Conjunctivae and EOM are normal. Pupils are equal, round, and reactive to light.  Neck: Normal range of motion. Neck supple.  Cardiovascular: Normal rate, regular rhythm and normal heart sounds.   Pulmonary/Chest: Effort normal and breath sounds normal.  Abdominal: Soft. Bowel sounds are normal. There is tenderness (Tenderness to mild palpation and eliecits extreme nausea in the patient; patient the proceeds to vomit 200cc) in the right upper quadrant, epigastric area and left upper quadrant.    Lymphadenopathy:    She has no cervical adenopathy.  Neurological: She is alert and oriented to person, place, and time.  Skin: Skin is warm and dry. She is not diaphoretic.    ED Course  Procedures (including critical care time)  The patient is no longer orthostatic. The patient has a UTI that we are treating as well> She has been stable here in the ER. She is still complaining of dizziness. b        MDM  MDM Reviewed: nursing note and vitals Interpretation: labs and ECG    Date: 07/01/2011  Rate:77  Rhythm: normal sinus rhythm  QRS Axis: normal  Intervals: normal  ST/T Wave abnormalities: normal  Conduction Disutrbances:none  Narrative Interpretation:   Old EKG Reviewed: unchanged          Carlyle Dolly, PA-C 07/01/11 2121  Carlyle Dolly, PA-C 07/01/11 2121

## 2011-07-02 ENCOUNTER — Inpatient Hospital Stay (HOSPITAL_COMMUNITY): Payer: Managed Care, Other (non HMO)

## 2011-07-02 ENCOUNTER — Encounter (HOSPITAL_COMMUNITY): Payer: Self-pay | Admitting: Internal Medicine

## 2011-07-02 DIAGNOSIS — R42 Dizziness and giddiness: Secondary | ICD-10-CM | POA: Diagnosis present

## 2011-07-02 DIAGNOSIS — E860 Dehydration: Secondary | ICD-10-CM | POA: Diagnosis present

## 2011-07-02 DIAGNOSIS — I10 Essential (primary) hypertension: Secondary | ICD-10-CM | POA: Diagnosis present

## 2011-07-02 DIAGNOSIS — E876 Hypokalemia: Secondary | ICD-10-CM | POA: Diagnosis present

## 2011-07-02 DIAGNOSIS — D649 Anemia, unspecified: Secondary | ICD-10-CM | POA: Diagnosis present

## 2011-07-02 DIAGNOSIS — N390 Urinary tract infection, site not specified: Secondary | ICD-10-CM | POA: Diagnosis present

## 2011-07-02 DIAGNOSIS — Z8673 Personal history of transient ischemic attack (TIA), and cerebral infarction without residual deficits: Secondary | ICD-10-CM

## 2011-07-02 DIAGNOSIS — R55 Syncope and collapse: Secondary | ICD-10-CM | POA: Diagnosis present

## 2011-07-02 LAB — BASIC METABOLIC PANEL
CO2: 23 mEq/L (ref 19–32)
Chloride: 107 mEq/L (ref 96–112)
Potassium: 3.4 mEq/L — ABNORMAL LOW (ref 3.5–5.1)
Sodium: 140 mEq/L (ref 135–145)

## 2011-07-02 LAB — IRON AND TIBC
Iron: 61 ug/dL (ref 42–135)
UIBC: 181 ug/dL (ref 125–400)

## 2011-07-02 LAB — GLUCOSE, CAPILLARY
Glucose-Capillary: 305 mg/dL — ABNORMAL HIGH (ref 70–99)
Glucose-Capillary: 313 mg/dL — ABNORMAL HIGH (ref 70–99)
Glucose-Capillary: 254 mg/dL — ABNORMAL HIGH (ref 70–99)

## 2011-07-02 LAB — CBC
HCT: 33.8 % — ABNORMAL LOW (ref 36.0–46.0)
Hemoglobin: 10.8 g/dL — ABNORMAL LOW (ref 12.0–15.0)
MCV: 84.3 fL (ref 78.0–100.0)
Platelets: 176 10*3/uL (ref 150–400)
RBC: 4.01 MIL/uL (ref 3.87–5.11)
WBC: 8.9 10*3/uL (ref 4.0–10.5)

## 2011-07-02 LAB — FERRITIN: Ferritin: 204 ng/mL (ref 10–291)

## 2011-07-02 LAB — RETICULOCYTES
Retic Count, Absolute: 61.2 10*3/uL (ref 19.0–186.0)
Retic Ct Pct: 1.4 % (ref 0.4–3.1)

## 2011-07-02 MED ORDER — INSULIN ASPART 100 UNIT/ML ~~LOC~~ SOLN
0.0000 [IU] | Freq: Three times a day (TID) | SUBCUTANEOUS | Status: DC
  Administered 2011-07-02: 11 [IU] via SUBCUTANEOUS
  Administered 2011-07-03: 7 [IU] via SUBCUTANEOUS

## 2011-07-02 MED ORDER — METFORMIN HCL 500 MG PO TABS
500.0000 mg | ORAL_TABLET | Freq: Two times a day (BID) | ORAL | Status: DC
Start: 2011-07-02 — End: 2011-07-03
  Administered 2011-07-02 – 2011-07-03 (×2): 500 mg via ORAL
  Filled 2011-07-02 (×4): qty 1

## 2011-07-02 MED ORDER — SENNA 8.6 MG PO TABS
1.0000 | ORAL_TABLET | Freq: Two times a day (BID) | ORAL | Status: DC
  Administered 2011-07-02 – 2011-07-03 (×3): 8.6 mg via ORAL
  Filled 2011-07-02 (×3): qty 1

## 2011-07-02 MED ORDER — SODIUM CHLORIDE 0.45 % IV SOLN
INTRAVENOUS | Status: DC
  Administered 2011-07-02: 04:00:00 via INTRAVENOUS

## 2011-07-02 MED ORDER — ACETAMINOPHEN 650 MG RE SUPP: 650.0000 mg | Freq: Four times a day (QID) | RECTAL | Status: DC | PRN

## 2011-07-02 MED ORDER — SIMVASTATIN 20 MG PO TABS
20.0000 mg | ORAL_TABLET | Freq: Every day | ORAL | Status: DC
  Administered 2011-07-02: 20 mg via ORAL
  Filled 2011-07-02 (×2): qty 1

## 2011-07-02 MED ORDER — POTASSIUM CHLORIDE CRYS ER 20 MEQ PO TBCR
40.0000 meq | EXTENDED_RELEASE_TABLET | Freq: Once | ORAL | Status: AC
  Administered 2011-07-02: 40 meq via ORAL
  Filled 2011-07-02: qty 2

## 2011-07-02 MED ORDER — ONDANSETRON HCL 4 MG PO TABS: 4.0000 mg | ORAL_TABLET | Freq: Four times a day (QID) | ORAL | Status: DC | PRN

## 2011-07-02 MED ORDER — GLIMEPIRIDE 4 MG PO TABS
4.0000 mg | ORAL_TABLET | Freq: Every day | ORAL | Status: DC
  Administered 2011-07-02 – 2011-07-03 (×2): 4 mg via ORAL
  Filled 2011-07-02 (×2): qty 1

## 2011-07-02 MED ORDER — INSULIN ASPART 100 UNIT/ML ~~LOC~~ SOLN
6.0000 [IU] | Freq: Three times a day (TID) | SUBCUTANEOUS | Status: DC
  Administered 2011-07-02 – 2011-07-03 (×2): 6 [IU] via SUBCUTANEOUS

## 2011-07-02 MED ORDER — ASPIRIN-DIPYRIDAMOLE ER 25-200 MG PO CP12
1.0000 | ORAL_CAPSULE | Freq: Every day | ORAL | Status: DC
  Administered 2011-07-02 – 2011-07-03 (×2): 1 via ORAL
  Filled 2011-07-02 (×2): qty 1

## 2011-07-02 MED ORDER — GADOBENATE DIMEGLUMINE 529 MG/ML IV SOLN
20.0000 mL | Freq: Once | INTRAVENOUS | Status: AC | PRN
  Administered 2011-07-02: 20 mL via INTRAVENOUS

## 2011-07-02 MED ORDER — INSULIN GLARGINE 100 UNIT/ML ~~LOC~~ SOLN
20.0000 [IU] | Freq: Every day | SUBCUTANEOUS | Status: DC
  Administered 2011-07-02: 20 [IU] via SUBCUTANEOUS
  Filled 2011-07-02: qty 3

## 2011-07-02 MED ORDER — ALUM & MAG HYDROXIDE-SIMETH 200-200-20 MG/5ML PO SUSP: 30.0000 mL | Freq: Four times a day (QID) | ORAL | Status: DC | PRN

## 2011-07-02 MED ORDER — INSULIN ASPART 100 UNIT/ML ~~LOC~~ SOLN
0.0000 [IU] | Freq: Every day | SUBCUTANEOUS | Status: DC
  Administered 2011-07-02: 2 [IU] via SUBCUTANEOUS

## 2011-07-02 MED ORDER — ONDANSETRON HCL 4 MG/2ML IJ SOLN: 4.0000 mg | Freq: Four times a day (QID) | INTRAMUSCULAR | Status: DC | PRN

## 2011-07-02 MED ORDER — HEPARIN SODIUM (PORCINE) 5000 UNIT/ML IJ SOLN
5000.0000 [IU] | Freq: Three times a day (TID) | INTRAMUSCULAR | Status: DC
  Administered 2011-07-02 (×3): 5000 [IU] via SUBCUTANEOUS
  Filled 2011-07-02 (×7): qty 1

## 2011-07-02 MED ORDER — SODIUM CHLORIDE 0.9 % IJ SOLN
3.0000 mL | Freq: Two times a day (BID) | INTRAMUSCULAR | Status: DC
  Administered 2011-07-02: 3 mL via INTRAVENOUS

## 2011-07-02 MED ORDER — LISINOPRIL 10 MG PO TABS
10.0000 mg | ORAL_TABLET | Freq: Every day | ORAL | Status: DC
  Administered 2011-07-02 – 2011-07-03 (×2): 10 mg via ORAL
  Filled 2011-07-02 (×2): qty 1

## 2011-07-02 MED ORDER — DOCUSATE SODIUM 100 MG PO CAPS
100.0000 mg | ORAL_CAPSULE | Freq: Two times a day (BID) | ORAL | Status: DC
  Administered 2011-07-02 – 2011-07-03 (×2): 100 mg via ORAL
  Filled 2011-07-02 (×4): qty 1

## 2011-07-02 MED ORDER — ACETAMINOPHEN 325 MG PO TABS
650.0000 mg | ORAL_TABLET | Freq: Four times a day (QID) | ORAL | Status: DC | PRN
  Administered 2011-07-02: 650 mg via ORAL
  Filled 2011-07-02: qty 2

## 2011-07-02 MED ORDER — LABETALOL HCL 100 MG PO TABS
100.0000 mg | ORAL_TABLET | Freq: Two times a day (BID) | ORAL | Status: DC
  Administered 2011-07-02 – 2011-07-03 (×3): 100 mg via ORAL
  Filled 2011-07-02 (×4): qty 1

## 2011-07-02 MED ORDER — INSULIN GLARGINE 100 UNIT/ML ~~LOC~~ SOLN
20.0000 [IU] | Freq: Every day | SUBCUTANEOUS | Status: DC
  Filled 2011-07-02: qty 3

## 2011-07-02 MED ORDER — AMLODIPINE BESYLATE 5 MG PO TABS
5.0000 mg | ORAL_TABLET | Freq: Every day | ORAL | Status: DC
  Administered 2011-07-02 – 2011-07-03 (×2): 5 mg via ORAL
  Filled 2011-07-02 (×2): qty 1

## 2011-07-02 MED ORDER — INSULIN ASPART 100 UNIT/ML ~~LOC~~ SOLN
0.0000 [IU] | Freq: Three times a day (TID) | SUBCUTANEOUS | Status: DC
Start: 2011-07-02 — End: 2011-07-02
  Administered 2011-07-02: 15 [IU] via SUBCUTANEOUS
  Filled 2011-07-02: qty 3

## 2011-07-02 NOTE — Progress Notes (Signed)
   CARE MANAGEMENT NOTE 07/02/2011  Patient:  [name], [name]   Account Number:  [id number]  Date Initiated:  07/02/2011  Documentation initiated by:  Lanier Clam  Subjective/Objective Assessment:   ADMITTED W/SYNCOPE. HX: SYNCOPE,DM,OBESITY.DX: SYNCOPE.     Action/Plan:   FROM HOME ALONE.HAS PCP,RW.   Anticipated DC Date:  07/08/2011   Anticipated DC Plan:  HOME W HOME HEALTH SERVICES         Choice offered to / List presented to:  C-1 Patient           Status of service:  In process, will continue to follow Medicare Important Message given?   (If response is "NO", the following Medicare IM given date fields will be blank) Date Medicare IM given:   Date Additional Medicare IM given:    Discharge Disposition:    Per UR Regulation:  Reviewed for med. necessity/level of care/duration of stay  Comments:  07/02/11 Aariah Godette RN,BSN NCM [phone] UPDATE:PATIENT HAS BENEFIT FOR HH/DME-AFTER DEDUCTIBLE MET OF BAL $374.15.ANY HHC AGENCY,BUT [facility] FOR DME. PT-HH.PROVIDED [facility] AGENCY LIST.IF ADDITIONAL DME NEEDED-[employer] INSURANCE-[facility] IS CONTRACTED DME CO.

## 2011-07-02 NOTE — Evaluation (Signed)
Physical Therapy Evaluation Patient Details Name: [name] MRN: [id number] DOB: Jun 25, 1955 Today's Date: 07/02/2011  Problem List:  Patient Active Problem List  Diagnoses  . HYPERLIPIDEMIA  . OBESITY  . PERIPHERAL NEUROPATHY  . HYPERTENSION  . CAD, NATIVE VESSEL  . LEFT VENTRICULAR FUNCTION, DECREASED  . CVA  . GERD  . HIATAL HERNIA  . IRRITABLE BOWEL SYNDROME  . SYNCOPE  . DM type 2, uncontrolled, with neuropathy  . Syncope  . Dizziness  . Hypokalemia  . HTN (hypertension)  . UTI (lower urinary tract infection)  . H/O: CVA (cardiovascular accident)  . Normocytic anemia    Past Medical History:  Past Medical History  Diagnosis Date  . Coronary atherosclerosis of native coronary artery     non obstructive by cath in 2008 and 2010  . Unspecified essential hypertension   . Other and unspecified hyperlipidemia   . Syncope and collapse     near-syncopal episode in November/2008  . Unspecified cardiovascular disease     Decreased left-ventricular fxn  . 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 Unspecified hereditary and idiopathic peripheral neuropathy   . Obesity, unspecified   . Esophageal reflux   . History of diabetes mellitus, type II   . Diaphragmatic hernia without mention of obstruction or gangrene   . Irritable bowel syndrome   . Morbid obesity    Past Surgical History:  Past Surgical History  Procedure Date  . Invasive electrophysiologic study 5/09    followed by insertion of an implantable loop recorder. S/p removal   . Colonoscopy   . Cholecystectomy   . Multiple toe surgeries   . Cardiac catheterization     LAD 30%, circumflex 50%, OM 75%, RI 60% with small branch 80%, dominant RCA 60%, EF 45-50%    PT Assessment/Plan/Recommendation PT Assessment Clinical Impression Statement: Pt admitted for syncope.  Pt reporting dizziness (denies spinning) with ambulation and increased L foot pain both limiting  distance.  Pt would benefit from acute PT services in order to improve safety with ambulation and stairs prior to d/c.  Pt reports she is considering HHPT. PT Recommendation/Assessment: Patient will need skilled PT in the acute care venue PT Problem List: Decreased strength;Decreased activity tolerance;Decreased mobility;Decreased knowledge of use of DME;Pain;Decreased safety awareness PT Therapy Diagnosis : Difficulty walking PT Plan PT Frequency: Min 3X/week PT Treatment/Interventions: DME instruction;Gait training;Stair training;Functional mobility training;Therapeutic exercise;Therapeutic activities;Patient/family education PT Recommendation Follow Up Recommendations: Home health PT Equipment Recommended: None recommended by PT PT Goals  Acute Rehab PT Goals PT Goal Formulation: With patient Time For Goal Achievement: 7 days Pt will go Sit to Stand: with modified independence PT Goal: Sit to Stand - Progress: Goal set today Pt will go Stand to Sit: with modified independence PT Goal: Stand to Sit - Progress: Goal set today Pt will Ambulate: >150 feet;with modified independence;with least restrictive assistive device PT Goal: Ambulate - Progress: Goal set today Pt will Go Up / Down Stairs: 1-2 stairs;with modified independence;with least restrictive assistive device PT Goal: Up/Down Stairs - Progress: Goal set today Pt will Perform Home Exercise Program: with supervision, verbal cues required/provided PT Goal: Perform Home Exercise Program - Progress: Goal set today  PT Evaluation Precautions/Restrictions  Precautions Precautions: Fall Restrictions Weight Bearing Restrictions: No Prior Functioning  Home Living Lives With: Alone Type of Home: House Home Layout: One level Home Access: Stairs to enter Entrance Stairs-Rails: None Entrance Stairs-Number of Steps: 1 Home Adaptive  Equipment: Walker - rolling Prior Function Level of Independence: Independent with gait;Independent  with basic ADLs Vocation: Full time employment Cognition Cognition Arousal/Alertness: Awake/alert Overall Cognitive Status: Appears within functional limits for tasks assessed Orientation Level: Oriented X4 Sensation/Coordination Sensation Additional Comments: Bilateral numbness in feet 2* peripheral neuropathy Extremity Assessment RLE Strength Right Hip Flexion: 3+/5 Right Hip ABduction: 3+/5 Right Hip ADduction: 3+/5 Right Knee Extension: 4/5 Right Ankle Dorsiflexion: 4/5 LLE Strength Left Hip Flexion: 3+/5 Left Hip ABduction: 3+/5 Left Hip ADduction: 3+/5 Left Knee Extension: 4/5 Left Ankle Dorsiflexion: 4/5 Mobility (including Balance) Bed Mobility Bed Mobility: Yes Supine to Sit: 6: Modified independent (Device/Increase time) Transfers Transfers: Yes Sit to Stand: 4: Min assist;With upper extremity assist;From bed Sit to Stand Details (indicate cue type and reason): min/guard for safety, verbal cues for hand placement Stand to Sit: To bed;With upper extremity assist;4: Min assist Stand to Sit Details: min/guard for safety Ambulation/Gait Ambulation/Gait: Yes Ambulation/Gait Assistance: 4: Min assist Ambulation/Gait Assistance Details (indicate cue type and reason): min/guard, verbal cues for safe use of RW, pt reported dizziness limiting distance but immediately felt better when back in room sitting EOB, pt also reported L foot pain 8/10 with ambulation Ambulation Distance (Feet): 40 Feet Assistive device: Rolling walker Gait Pattern: Step-through pattern;Trunk flexed;Decreased stride length Gait velocity: slow cadence    Exercise    End of Session PT - End of Session Activity Tolerance: Patient limited by pain;Other (comment) (and dizziness) Patient left: in bed;with call bell in reach;Other (comment) (with nursing student getting vitals) General Behavior During Session: [facility] for tasks performed Cognition: [name] for tasks performed  [name],[name] 07/02/2011,  11:15 AM Pager: [phone]

## 2011-07-02 NOTE — Progress Notes (Signed)
PATIENT DETAILS Name: AILANA CUADRADO Age: 56 y.o. Sex: female Date of Birth: Aug 28, 1955 Admit Date: 07/01/2011 AVW:UJWJX,BJYNW, MD, MD Emergency contact: Kadajah, Kjos Father [phone]   CONSULTS: None  Interval History: Ms. Gubler is a 56 year old female with h/o poorly controlled DM (A1c 13 in 11/2010), obesity, CVA, h/o frequent syncope  several years ago with no clear etiology but negative loop recorder evaluation who presented to the ER on 07/02/11 with syncope.  ROS: Mrs. Pommier denies any current pre-syncopal symptoms.  She has not been diaphoretic, had complaints of chest pain or palpitations.  She states her vision was blurry yesterday, but no complaints of diplopia.  She denies any focal neurological deficits or dysphagia.   Objective: Vital signs in last 24 hours: Temp:  [97.6 F (36.4 C)-98.1 F (36.7 C)] 97.7 F (36.5 C) (02/13 0919) Pulse Rate:  [78-95] 81  (02/13 0919) Resp:  [16-22] 16  (02/13 0919) BP: (144-194)/(56-104) 156/87 mmHg (02/13 0919) SpO2:  [97 %-100 %] 99 % (02/13 0919) Weight:  [108.863 kg (240 lb)] 108.863 kg (240 lb) (02/13 0153) Weight change:  Last BM Date: 07/01/11  Intake/Output from previous day:  Intake/Output Summary (Last 24 hours) at 07/02/11 0953 Last data filed at 07/02/11 0751  Gross per 24 hour  Intake   1200 ml  Output    300 ml  Net    900 ml     Physical Exam:  Gen:  NAD Cardiovascular:  RRR, No M/R/G Respiratory: Lungs CTAB Gastrointestinal: Abdomen soft and obese, NT/ND with normal active bowel sounds. Extremities: No C/E/C Neurological: A+Ox3, non-focal, no pronator drift.     Lab Results: Basic Metabolic Panel:  Lab 07/02/11 5784 07/01/11 1620  NA 140 138  K 3.4* 3.7  CL 107 102  CO2 23 25  GLUCOSE 353* 385*  BUN 11 13  CREATININE 0.60 0.55  CALCIUM 7.9* 8.9  MG -- --  PHOS -- --   GFR Estimated Creatinine Clearance: 98.1 ml/min (by C-G formula based on Cr of 0.6). Liver Function  Tests:  Lab 07/01/11 1620  AST 13  ALT 22  ALKPHOS 173*  BILITOT 0.3  PROT 6.7  ALBUMIN 3.1*   CBC:  Lab 07/02/11 0435 07/01/11 1620  WBC 8.9 8.5  NEUTROABS -- 5.9  HGB 10.8* 12.5  HCT 33.8* 38.1  MCV 84.3 83.9  PLT 176 192   Cardiac Enzymes:  Lab 07/01/11 1840  CKTOTAL --  CKMB --  CKMBINDEX --  TROPONINI <0.30   CBG:  Lab 07/02/11 0726 07/02/11 0150 07/01/11 2235 07/01/11 1912  GLUCAP 313* 305* 298* 326*   Hgb A1c  Basename 07/02/11 0435  HGBA1C 13.8*    Studies/Results: Ct Head Wo Contrast  07/01/2011  *RADIOLOGY REPORT*  Clinical Data: Syncope and dizziness  CT HEAD WITHOUT CONTRAST  Technique:  Contiguous axial images were obtained from the base of the skull through the vertex without contrast.  Comparison: None  Findings: There is diffuse low density within the subcortical and periventricular white matter consistent with chronic small vessel ischemic change.  There is prominence of the sulci and ventricles consistent with brain atrophy.  The brain has an otherwise unremarkable appearance without evidence for hemorrhage, infarction, hydrocephalus, or mass lesion.  There is no extra axial fluid collection.  The skull and paranasal sinuses are normal.  The paranasal sinuses and the mastoid air cells appear clear.  The skull appears intact.  IMPRESSION:  1.  No acute intracranial abnormalities. 2.  Small vessel ischemic  change and brain atrophy.  Original Report Authenticated By: Rosealee Albee, M.D.    Medications: Scheduled Meds:    . sodium chloride   Intravenous Once  . acetaminophen  650 mg Oral Once  . cefTRIAXone (ROCEPHIN)  IV  1 g Intravenous Once  . dipyridamole-aspirin  1 capsule Oral Daily  . docusate sodium  100 mg Oral BID  . glimepiride  4 mg Oral QAC breakfast  . heparin  5,000 Units Subcutaneous Q8H  . insulin aspart  0-20 Units Subcutaneous TID WC  . insulin aspart  6 Units Intravenous Once  . insulin glargine  20 Units Subcutaneous QHS   . ketorolac  30 mg Intravenous Once  . labetalol  100 mg Oral BID  . labetalol  20 mg Intravenous Once  . lisinopril  10 mg Oral Daily  . metFORMIN  500 mg Oral BID WC  . ondansetron      . ondansetron      . ondansetron (ZOFRAN) IV  4 mg Intravenous Once  . ondansetron (ZOFRAN) IV  4 mg Intravenous Once  . ondansetron (ZOFRAN) IV  4 mg Intravenous Once  . senna  1 tablet Oral BID  . simvastatin  20 mg Oral QHS  . sodium chloride  1,000 mL Intravenous Once  . sodium chloride  1,000 mL Intravenous Once  . sodium chloride  3 mL Intravenous Q12H   Continuous Infusions:    . sodium chloride 100 mL/hr at 07/02/11 0337   PRN Meds:.acetaminophen, acetaminophen, alum & mag hydroxide-simeth, ondansetron (ZOFRAN) IV, ondansetron Antibiotics: Anti-infectives     Start     Dose/Rate Route Frequency Ordered Stop   07/01/11 2030   cefTRIAXone (ROCEPHIN) 1 g in dextrose 5 % 50 mL IVPB        1 g 100 mL/hr over 30 Minutes Intravenous  Once 07/01/11 2025 07/01/11 2134           Assessment/Plan:  Principal Problem:  *Syncope The patient was admitted and monitored on telemetry.  No arrhythmic events noted.  CT head unremarkable.  Not orthostatic.  Will get MRI/MRA brain and PT/OT evaluations. Active Problems:  DM type 2, uncontrolled, with neuropathy Hemoglobin A1c indicates poor outpatient control.  Will get DM coordinator to see and evaluate.  Will resume Lantus (but increase dose to 20 units) and continue SSI (insulin resistant scale).  Will add 6 units Novalog Q AC.  Had been on 15 units of Lantus prior to admission.  Dizziness Likely related to uncontrolled HTN and DM.    Dehydration Urine specific gravity was high but BUN/creatinine were WNL.  She has been gently hydrated.  D/C IVF given HTN.  Hypokalemia Will give 40 mEq PO replacement therapy.  HTN (hypertension) Currently on Lisinopril.  BP not controlled.  Add Norvasc.  UTI Unfortunately, urine cultures not sent before  Rocephin given.  Will continue Rocephin for now.  Class II Obesity: BMI 38.8 Will ask dietician to counsel.  H/O CVA Continue Aggrenox.  Dyslipidemia Continue statin.  Normocytic Anemia Monitor.  Check anemia panel.       LOS: 1 day   Hillery Aldo, MD Pager [phone]  07/02/2011, 9:53 AM

## 2011-07-02 NOTE — ED Notes (Signed)
Nursing report given to Amy, RN on [address]

## 2011-07-02 NOTE — H&P (Signed)
PCP:   [name], MD, MD  Confirmed with pt [name], cardiology  Chief Complaint:  Syncope  HPI: [phone] with h/o poorly controlled DM (A1c 13 in 11/2010), obesity, CVA, h/o frequent syncope  several years ago with no clear etiology but negative loop recorder evaluation presents with  syncope.   Pt is reliable historian. Pt woke up this am slightly nauseated and with reflux sensation.  She went to work and reports sitting at her computer where she had blurry vision such that  she had to adjust her glasses to see the computer screen. This lasted for >30 minutes.  Eventually she got up to urinate and while walking to the bathroom felt dizzy sensation.  After urinating, she was walking back to her desk, having to hold onto cubicles because of  gait imbalance, and eventually the room started spinning and she lost consciousness. Upon  awakening, she knew where she was and asked what happened. She states she was probably out a  couple minutes. She recognized the people around her, i.e. was not postictal. She denies any  cardiopulmonary symptoms, no chest pain, SOB, palpitations, no tingling/numbness or focal  neuro deficits, and no other promontory symptoms otherwise.   In the ED vitals were stable, she was noted to be orthostatic by pulse from 78 to 90 but with  very preserved blood pressure, and in fact running quite hypertense. Labs showed normal chem  panel including renal fxn, except hyperglycemia up to 285. Trop negative. CBC normal. UA  >1000 glucose, 15 ketones, negative signs of infxn although many bacteria, only 0-2 WBC. CT  head negative for acute process. VBG was normal. Pt has been given NS, tylenol, 1g  Ceftriaxone, insulin aspart, toradol, labetalol, zofran x3, and 2L NS.   She was noted to be quite dizzy and unstable on her feet in the ED, requiring assistance with  walking. She requested admission stating that she lived alone and felt unsafe.   On further questioning  she also states she's had cramping in her legs and feet and was  previously told it was neuropathy for which she's tried Lyrica but it didn't work and is not  on Ibuprofen for it.    Past Medical History  Diagnosis Date  . Coronary atherosclerosis of native coronary artery     non obstructive by cath in 2008 and 2010  . Unspecified essential hypertension   . Other and unspecified hyperlipidemia   . Syncope and collapse     near-syncopal episode in November/2008  . Unspecified cardiovascular disease     Decreased left-ventricular fxn  . 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 Unspecified hereditary and idiopathic peripheral neuropathy   . Obesity, unspecified   . Esophageal reflux   . History of diabetes mellitus, type II   . Diaphragmatic hernia without mention of obstruction or gangrene   . Irritable bowel syndrome   . Morbid obesity     Past Surgical History  Procedure Date  . Invasive electrophysiologic study 5/09    followed by insertion of an implantable loop recorder. S/p removal   . Colonoscopy   . Cholecystectomy   . Multiple toe surgeries   . Cardiac catheterization     LAD 30%, circumflex 50%, OM 75%, RI 60% with small branch 80%, dominant RCA 60%, EF 45-50%    HOME MEDS: Reconciled by name  Prior to Admission medications   Medication Sig Start Date End Date Taking? Authorizing Provider  acetaminophen (TYLENOL) 500 MG tablet Take 1,000 mg by mouth every 6 (six) hours as needed. For pain   Yes Historical Provider, MD  glimepiride (AMARYL) 4 MG tablet Take 4 mg by mouth daily before breakfast.     Yes Historical Provider, MD  ibuprofen (ADVIL,MOTRIN) 800 MG tablet Take 800 mg by mouth 3 (three) times daily.    Yes Historical Provider, MD  insulin glargine (LANTUS) 100 UNIT/ML injection Inject 15 Units into the skin at bedtime.     Yes Historical Provider, MD  lisinopril (PRINIVIL,ZESTRIL) 10 MG tablet Take 10 mg by mouth  daily.     Yes Historical Provider, MD  metFORMIN (GLUCOPHAGE) 500 MG tablet Take 500 mg by mouth 2 (two) times daily with a meal.   Yes Historical Provider, MD  simvastatin (ZOCOR) 20 MG tablet Take 20 mg by mouth at bedtime.     Yes Historical Provider, MD  dipyridamole-aspirin (AGGRENOX) 25-200 MG per 12 hr capsule Take 1 capsule by mouth daily.      Historical Provider, MD    Allergies:  Allergies  Allergen Reactions  . Banana     Face puffs up  . Penicillins     REACTION: itching, edema  . Strawberry     Face puffs up    Social History:   reports that she has been smoking Cigarettes.  She has a 30 pack-year smoking history. She does not have any smokeless tobacco history on file. She reports that she drinks alcohol. She reports that she does not use illicit drugs.  Family History: Family History  Problem Relation Age of Onset  . Pneumonia Mother   . Gallbladder disease Mother     cancer  . Heart failure Mother   . Diabetes Father   . Coronary artery disease Father   . Stroke Neg Hx     Physical Exam: Filed Vitals:   07/01/11 1946 07/01/11 2236 07/02/11 0124 07/02/11 0153  BP: 188/82 144/67 153/56 168/82  Pulse: 82 83 87 82  Temp:    97.6 F (36.4 C)  TempSrc:    Oral  Resp: 16 16  20   Height:    5\' 6"  (1.676 m)  Weight:    108.863 kg (240 lb)  SpO2: 97% 99% 98% 99%   Blood pressure 168/82, pulse 82, temperature 97.6 F (36.4 C), temperature source Oral, resp. rate 20, height 5\' 6"  (1.676 m), weight 108.863 kg (240 lb), SpO2 99.00%.  Gen: Very obese F in hospital bed, alert, attentive, pleasant, able to relate history well.  No distress appears comfortable.  HEENT: Pupils round, reactive, EOMI, sclera clear, normal appearing. Mouth moist, normal  appearing, not overtly dry appearing Lungs: CTAB no w/c/r, normal air movement Heart: Regular, not tachycardic S1/2 clear, normal, no m/g heard Abd: Soft NT ND, benign exam, but obese.  Extrem: Increased bulk but  normal tone, warm and perfusing normally, no BLE edema noted Neuro: Alert, attentive conversant, CN 2-12 intact no slurred speech or facial droop, tongue  midline with good movement, moves extremities on her own spontaneously, with good strength  for BUE's and BLE's noted. Grossly non-focal   Labs & Imaging Results for orders placed during the hospital encounter of 07/01/11 (from the past 48 hour(s))  BLOOD GAS, VENOUS     Status: Abnormal   Collection Time   07/01/11  6:40 AM      Component Value Range Comment   pH, Ven 7.406 (*) 7.250 - 7.300     pCO2,  Ven 37.8 (*) 45.0 - 50.0 (mmHg)    pO2, Ven 72.4 (*) 30.0 - 45.0 (mmHg)    Bicarbonate 23.3  20.0 - 24.0 (mEq/L)    TCO2 20.7  0 - 100 (mmol/L)    Acid-base deficit 0.7  0.0 - 2.0 (mmol/L)    O2 Saturation 95.1      Patient temperature 98.6      Collection site VENOUS      Sample type VENOUS     CBC     Status: Normal   Collection Time   07/01/11  4:20 PM      Component Value Range Comment   WBC 8.5  4.0 - 10.5 (K/uL)    RBC 4.54  3.87 - 5.11 (MIL/uL)    Hemoglobin 12.5  12.0 - 15.0 (g/dL)    HCT 40.9  81.1 - 91.4 (%)    MCV 83.9  78.0 - 100.0 (fL)    MCH 27.5  26.0 - 34.0 (pg)    MCHC 32.8  30.0 - 36.0 (g/dL)    RDW 78.2  95.6 - 21.3 (%)    Platelets 192  150 - 400 (K/uL)   DIFFERENTIAL     Status: Normal   Collection Time   07/01/11  4:20 PM      Component Value Range Comment   Neutrophils Relative 70  43 - 77 (%)    Neutro Abs 5.9  1.7 - 7.7 (K/uL)    Lymphocytes Relative 25  12 - 46 (%)    Lymphs Abs 2.1  0.7 - 4.0 (K/uL)    Monocytes Relative 5  3 - 12 (%)    Monocytes Absolute 0.4  0.1 - 1.0 (K/uL)    Eosinophils Relative 1  0 - 5 (%)    Eosinophils Absolute 0.1  0.0 - 0.7 (K/uL)    Basophils Relative 0  0 - 1 (%)    Basophils Absolute 0.0  0.0 - 0.1 (K/uL)   COMPREHENSIVE METABOLIC PANEL     Status: Abnormal   Collection Time   07/01/11  4:20 PM      Component Value Range Comment   Sodium 138  135 - 145 (mEq/L)     Potassium 3.7  3.5 - 5.1 (mEq/L)    Chloride 102  96 - 112 (mEq/L)    CO2 25  19 - 32 (mEq/L)    Glucose, Bld 385 (*) 70 - 99 (mg/dL)    BUN 13  6 - 23 (mg/dL)    Creatinine, Ser 0.86  0.50 - 1.10 (mg/dL)    Calcium 8.9  8.4 - 10.5 (mg/dL)    Total Protein 6.7  6.0 - 8.3 (g/dL)    Albumin 3.1 (*) 3.5 - 5.2 (g/dL)    AST 13  0 - 37 (U/L)    ALT 22  0 - 35 (U/L)    Alkaline Phosphatase 173 (*) 39 - 117 (U/L)    Total Bilirubin 0.3  0.3 - 1.2 (mg/dL)    GFR calc non Af Amer >90  >90 (mL/min)    GFR calc Af Amer >90  >90 (mL/min)   URINALYSIS, ROUTINE W REFLEX MICROSCOPIC     Status: Abnormal   Collection Time   07/01/11  5:56 PM      Component Value Range Comment   Color, Urine YELLOW  YELLOW     APPearance CLOUDY (*) CLEAR     Specific Gravity, Urine 1.035 (*) 1.005 - 1.030     pH 5.5  5.0 -  8.0     Glucose, UA >1000 (*) NEGATIVE (mg/dL)    Hgb urine dipstick SMALL (*) NEGATIVE     Bilirubin Urine NEGATIVE  NEGATIVE     Ketones, ur 15 (*) NEGATIVE (mg/dL)    Protein, ur NEGATIVE  NEGATIVE (mg/dL)    Urobilinogen, UA 0.2  0.0 - 1.0 (mg/dL)    Nitrite NEGATIVE  NEGATIVE     Leukocytes, UA NEGATIVE  NEGATIVE    URINE MICROSCOPIC-ADD ON     Status: Abnormal   Collection Time   07/01/11  5:56 PM      Component Value Range Comment   WBC, UA 0-2  <3 (WBC/hpf)    Bacteria, UA MANY (*) RARE     Urine-Other MANY YEAST     TROPONIN I     Status: Normal   Collection Time   07/01/11  6:40 PM      Component Value Range Comment   Troponin I <0.30  <0.30 (ng/mL)   GLUCOSE, CAPILLARY     Status: Abnormal   Collection Time   07/01/11  7:12 PM      Component Value Range Comment   Glucose-Capillary 326 (*) 70 - 99 (mg/dL)   GLUCOSE, CAPILLARY     Status: Abnormal   Collection Time   07/01/11 10:35 PM      Component Value Range Comment   Glucose-Capillary 298 (*) 70 - 99 (mg/dL)   GLUCOSE, CAPILLARY     Status: Abnormal   Collection Time   07/02/11  1:50 AM      Component Value Range  Comment   Glucose-Capillary 305 (*) 70 - 99 (mg/dL)    Comment 1 Notify RN      Comment 2 Documented in Chart      Ct Head Wo Contrast  07/01/2011  *RADIOLOGY REPORT*  Clinical Data: Syncope and dizziness  CT HEAD WITHOUT CONTRAST  Technique:  Contiguous axial images were obtained from the base of the skull through the vertex without contrast.  Comparison: None  Findings: There is diffuse low density within the subcortical and periventricular white matter consistent with chronic small vessel ischemic change.  There is prominence of the sulci and ventricles consistent with brain atrophy.  The brain has an otherwise unremarkable appearance without evidence for hemorrhage, infarction, hydrocephalus, or mass lesion.  There is no extra axial fluid collection.  The skull and paranasal sinuses are normal.  The paranasal sinuses and the mastoid air cells appear clear.  The skull appears intact.  IMPRESSION:  1.  No acute intracranial abnormalities. 2.  Small vessel ischemic change and brain atrophy.  Original Report Authenticated By: Rosealee Albee, M.D.   ECG: NSR, normal axis and intervals, no ST deviations, normal T waves. This is a pretty  unimpressive ECG.   Impression Present on Admission:  .Syncope .Dizziness .DIABETES MELLITUS, TYPE II, HX OF .Dehydration  55yoF with h/o poorly controlled DM (A1c 13 in 11/2010), obesity, CVA, h/o frequent syncope  several years ago with no clear etiology but negative loop recorder evaluation presents with  syncope.   1. Syncope: Suspect this is due to orthostasis/hypovolemia. Although blood work doesn't fully  supports this (no hypoNa or renal dysfunction), she endorses poorly controlled DM symptoms  (blurry vision, neuropathy, reflux) and is hyperglycemic and very glucosuric. Therefore, I  think poorly controlled diabetes is leading to hypovolemia leading to her syncope. Will  address DM as below. DDx includes vasovagal given that this happened after she  was urinating,  but she didn't have a BM or strain or anything, so think this is less likely.   Doubt other more serious etiologies. Do not feel UTI is present, will not continue treatment.  Doesn't sound like seizures. Doubt cardiac etiology -- negative Trop, negative ECG, no  cardiac symptoms, and pt had negative loop recorder evaluation several years ago for syncope.   - Get A1c, maintenance IVF's with 1/2NS, orthostatics daily until normal. PT consult.   2. Diabetes: Doubt any contrast studies this admission, so continue metformin. Continue  glimeprimide. Will increase home Lantus to 20u and add SSI, suspect Lantus will probably have  to go up too. Continue Lisinopril and statin.   3. HTN: Currently running pretty hypertense. Will continue Lisinopril and add PO Labetalol   3. H/o CVA: continue home aggrenox. Of note, pt states she ran out of this, consider giving  Rx on discharge.   Telemetry, [facility] team 2 Presumed full code  Other plans as per orders.  Hillis Mcphatter 07/02/2011, 3:25 AM

## 2011-07-02 NOTE — Plan of Care (Signed)
Problem: Food- and Nutrition-Related Knowledge Deficit (NB-1.1) Goal: Nutrition education Formal process to instruct or train a patient/client in a skill or to impart knowledge to help patients/clients voluntarily manage or modify food choices and eating behavior to maintain or improve health.  Outcome: Completed/Met Date Met:  07/02/11 Pt sleeping soundly at time of visit.  RD will check back when able to discuss nutrition therapy with pt. Handout left in ghost chart with d/c paperwork. RD phone number and number for [facility] also left for pt's reference.

## 2011-07-02 NOTE — Progress Notes (Signed)
   CARE MANAGEMENT NOTE 07/02/2011  Patient:  [name], [name]   Account Number:  [id number]  Date Initiated:  07/02/2011  Documentation initiated by:  Lanier Clam  Subjective/Objective Assessment:   ADMITTED W/SYNCOPE. HX: SYNCOPE,DM,OBESITY.DX: SYNCOPE.     Action/Plan:   FROM HOME ALONE.HAS PCP,RW.   Anticipated DC Date:  07/08/2011   Anticipated DC Plan:  HOME W HOME HEALTH SERVICES         Choice offered to / List presented to:  C-1 Patient           Status of service:  In process, will continue to follow Medicare Important Message given?   (If response is "NO", the following Medicare IM given date fields will be blank) Date Medicare IM given:   Date Additional Medicare IM given:    Discharge Disposition:    Per UR Regulation:  Reviewed for med. necessity/level of care/duration of stay  Comments:  07/02/11 Zyona Pettaway RN,BSN NCM [phone] PT-HH.PROVIDED [facility] AGENCY LIST.IF ADDITIONAL DME NEEDED-[employer] IS CONTRACTED DME CO.

## 2011-07-02 NOTE — ED Notes (Signed)
Dr. Joneen Roach would like pt to remain in the ER until she is able to assess the pt. Informed [facility] of such request and action.

## 2011-07-02 NOTE — ED Provider Notes (Signed)
Medical screening examination/treatment/procedure(s) were conducted as a shared visit with non-physician practitioner(s) and myself.  I personally evaluated the patient during the encounter.  Please see completed note for this encounter.  Raeford Razor, MD 07/02/11 [phone]

## 2011-07-03 LAB — GLUCOSE, CAPILLARY

## 2011-07-03 MED ORDER — POTASSIUM CHLORIDE CRYS ER 20 MEQ PO TBCR
40.0000 meq | EXTENDED_RELEASE_TABLET | Freq: Once | ORAL | Status: AC
  Administered 2011-07-03: 40 meq via ORAL
  Filled 2011-07-03: qty 2

## 2011-07-03 MED ORDER — INSULIN GLARGINE 100 UNIT/ML ~~LOC~~ SOLN: 25.0000 [IU] | Freq: Every day | SUBCUTANEOUS | Status: DC

## 2011-07-03 MED ORDER — CIPROFLOXACIN HCL 250 MG PO TABS: 250.0000 mg | ORAL_TABLET | Freq: Two times a day (BID) | ORAL | Status: AC

## 2011-07-03 MED ORDER — AMLODIPINE BESYLATE 10 MG PO TABS: 10.0000 mg | ORAL_TABLET | Freq: Every day | ORAL | Status: DC

## 2011-07-03 MED ORDER — INSULIN ASPART 100 UNIT/ML ~~LOC~~ SOLN: 6.0000 [IU] | Freq: Three times a day (TID) | SUBCUTANEOUS | Status: DC

## 2011-07-03 NOTE — Discharge Instructions (Signed)
Syncope You have had a fainting (syncopal) spell. A fainting episode is a sudden, short-lived loss of consciousness. It results in complete recovery. It occurs because there has been a temporary shortage of oxygen and/or sugar (glucose) to the brain. CAUSES   Blood pressure pills and other medications that may lower blood pressure below normal. Sudden changes in posture (sudden standing).   Over-medication. Take your medications as directed.   Standing too long. This can cause blood to pool in the legs.   Seizure disorders.   Low blood sugar (hypoglycemia) of diabetes. This more commonly causes coma.   Bearing down to go to the bathroom. This can cause your blood pressure to rise suddenly. Your body compensates by making the blood pressure too low when you stop bearing down.   Hardening of the arteries where the brain temporarily does not receive enough blood.   Irregular heart beat and circulatory problems.   Fear, emotional distress, injury, sight of blood, or illness.  Your caregiver will send you home if the syncope was from non-worrisome causes (benign). Depending on your age and health, you may stay to be monitored and observed. If you return home, have someone stay with you if your caregiver feels that is desirable. It is very important to keep all follow-up referrals and appointments in order to properly manage this condition. This is a serious problem which can lead to serious illness and death if not carefully managed.  WARNING: Do not drive or operate machinery until your caregiver feels that it is safe for you to do so. SEEK IMMEDIATE MEDICAL CARE IF:   You have another fainting episode or faint while lying or sitting down. DO NOT DRIVE YOURSELF. Call 911 if no other help is available.   You have chest pain, are feeling sick to your stomach (nausea), vomiting or abdominal pain.   You have an irregular heartbeat or one that is very fast (pulse over 120 beats per minute).    You have a loss of feeling in some part of your body or lose movement in your arms or legs.   You have difficulty with speech, confusion, severe weakness, or visual problems.   You become sweaty and/or feel light headed.  Make sure you are rechecked as instructed. Document Released: 05/05/2005 Document Revised: 01/15/2011 Document Reviewed: 12/24/2006 [employer] Patient Information 2012 [employer]. 

## 2011-07-03 NOTE — Discharge Summary (Signed)
Physician Discharge Summary  Patient ID: [name] MRN: [id number] DOB/AGE: 1956/02/08 56 y.o.  Admit date: 07/01/2011 Discharge date: 07/03/2011  Primary Care Physician:  [name], MD, MD   Discharge Diagnoses:    Present on Admission:  .Syncope .Dizziness .DM type 2, uncontrolled, with neuropathy .Dehydration .Hypokalemia .HTN (hypertension) .UTI (lower urinary tract infection) .OBESITY .Normocytic anemia  Discharge Medications:  Medication List  As of 07/03/2011  8:46 AM   TAKE these medications         acetaminophen 500 MG tablet   Commonly known as: TYLENOL   Take 1,000 mg by mouth every 6 (six) hours as needed. For pain      amLODipine 10 MG tablet   Commonly known as: NORVASC   Take 1 tablet (10 mg total) by mouth daily.      ciprofloxacin 250 MG tablet   Commonly known as: CIPRO   Take 1 tablet (250 mg total) by mouth 2 (two) times daily.      dipyridamole-aspirin 25-200 MG per 12 hr capsule   Commonly known as: AGGRENOX   Take 1 capsule by mouth daily.      glimepiride 4 MG tablet   Commonly known as: AMARYL   Take 4 mg by mouth daily before breakfast.      ibuprofen 800 MG tablet   Commonly known as: ADVIL,MOTRIN   Take 800 mg by mouth 3 (three) times daily.      insulin aspart 100 UNIT/ML injection   Commonly known as: novoLOG   Inject 6 Units into the skin 3 (three) times daily with meals.      insulin glargine 100 UNIT/ML injection   Commonly known as: LANTUS   Inject 25 Units into the skin at bedtime.      lisinopril 10 MG tablet   Commonly known as: PRINIVIL,ZESTRIL   Take 10 mg by mouth daily.      metFORMIN 500 MG tablet   Commonly known as: GLUCOPHAGE   Take 500 mg by mouth 2 (two) times daily with a meal.      simvastatin 20 MG tablet   Commonly known as: ZOCOR   Take 20 mg by mouth at bedtime.             Disposition and Follow-up: The patient is being discharged home.  She is instructed to follow up with Dr.  Mikeal Hawthorne in 1 week.  Consults: None  Significant Diagnostic Studies:  Ct Head Wo Contrast  07/01/2011   IMPRESSION:  1.  No acute intracranial abnormalities. 2.  Small vessel ischemic change and brain atrophy.  Original Report Authenticated By: Rosealee Albee, M.D.   Mr [facility] W Contrast  07/03/2011  IMPRESSION: Mild to moderate intracranial atherosclerotic disease diffusely without a critical stenosis.  Original Report Authenticated By: Camelia Phenes, M.D.   Mr Laqueta Jean Wo Contrast  07/03/2011  IMPRESSION: Mild chronic microvascular ischemic change.  No acute intracranial abnormality.     Discharge Laboratory Values: Basic Metabolic Panel:  Lab 07/02/11 4696 07/01/11 1620  NA 140 138  K 3.4* 3.7  CL 107 102  CO2 23 25  GLUCOSE 353* 385*  BUN 11 13  CREATININE 0.60 0.55  CALCIUM 7.9* 8.9  MG -- --  PHOS -- --   GFR Estimated Creatinine Clearance: 98.1 ml/min (by C-G formula based on Cr of 0.6). Liver Function Tests:  Lab 07/01/11 1620  AST 13  ALT 22  ALKPHOS 173*  BILITOT 0.3  PROT 6.7  ALBUMIN 3.1*    CBC:  Lab 07/02/11 0435 07/01/11 1620  WBC 8.9 8.5  NEUTROABS -- 5.9  HGB 10.8* 12.5  HCT 33.8* 38.1  MCV 84.3 83.9  PLT 176 192   Cardiac Enzymes:  Lab 07/01/11 1840  CKTOTAL --  CKMB --  CKMBINDEX --  TROPONINI <0.30   CBG:  Lab 07/03/11 0809 07/02/11 2229 07/02/11 1706 07/02/11 1144 07/02/11 0726  GLUCAP 248* 229* 91 254* 313*   Hgb A1c  Basename 07/02/11 0435  HGBA1C 13.8*   Anemia work up  [employer] 07/02/11 1130  VITAMINB12 608  FOLATE 16.4  FERRITIN 204  TIBC 242*  IRON 61  RETICCTPCT 1.4     Brief H and P: For complete details please refer to admission H and P, but in brief, [name] is a 56 year old female with h/o poorly controlled DM (A1c 13 in 11/2010), obesity, CVA, h/o frequent syncope  several years ago with no clear etiology but negative loop recorder evaluation who presented to the ER on 07/02/11 with syncope.     Physical Exam at Discharge: BP 174/73  Pulse 70  Temp(Src) 98.5 F (36.9 C) (Oral)  Resp 20  Ht 5\' 6"  (1.676 m)  Wt 108.863 kg (240 lb)  BMI 38.74 kg/m2  SpO2 95% Gen:  NAD Cardiovascular:  RRR, No M/R/G Respiratory: Lungs CTAB Gastrointestinal: Abdomen soft, NT/ND with normal active bowel sounds. Extremities: No C/E/C      Hospital Course:  Principal Problem:  *Syncope  The patient was admitted and monitored on telemetry. No arrhythmic events noted. CT head unremarkable. Not orthostatic.  MRI/MRA brain unremarkable.  I suspect her syncopal event was due to markedly elevated blood glucoses in the setting of a UTI.  She is instructed to follow up with Dr. Mikeal Hawthorne in 1 week.  Active Problems:  DM type 2, uncontrolled, with neuropathy  Hemoglobin A1c indicates poor outpatient control. We increased her Lantus to 25 units and added 6 units Novalog Q AC. Had been on 15 units of Lantus prior to admission with no meal coverage. Will need close follow up with Dr. Mikeal Hawthorne to ensure that her DM is under better control.  She was given handouts regarding diet changes by the dietician. Dizziness  Likely related to uncontrolled HTN and DM.  Dehydration  Urine specific gravity was high but BUN/creatinine were WNL. She was gently hydrated.  Hypokalemia  Was given replacement therapy, including 40 mEq of KCL on the date of discharge.  HTN (hypertension)  Currently on Lisinopril. BP not controlled, so we added Norvasc, which will be increased to 10 mg daily at discharge. UTI  Unfortunately, urine cultures not sent before Rocephin given. Will d/c on Cipro x 5 days. Class II Obesity: BMI 38.8  Status post dietician evaluation, handouts given to patient.  H/O CVA  Continue Aggrenox. No evidence of recurrent CVA. Dyslipidemia  Continue statin.  Normocytic Anemia  Anemia panel as noted above.  Anemia is mild with no deficiency states noted.  TIBC slightly low, so may have a component of  AOCD.   Recommendations for hospital follow-up: 1.  GI referral for routine health care maintenance for colonoscopy if not done.  Diet:  Heart healthy, carbohydrate modified.  Activity:  Increase activity slowly  Condition at Discharge:   Stable  Time spent on Discharge:  35 minutes.  Signed: Dr. Trula Ore Lynae Pederson Pager [phone] 07/03/2011, 8:46 AM

## 2011-11-04 ENCOUNTER — Emergency Department (HOSPITAL_COMMUNITY)
Admission: EM | Admit: 2011-11-04 | Discharge: 2011-11-04 | Disposition: A | Discharge status: Home / Self Care | Payer: Managed Care, Other (non HMO) | Attending: Emergency Medicine | Admitting: Emergency Medicine

## 2011-11-04 ENCOUNTER — Encounter (HOSPITAL_COMMUNITY): Payer: Self-pay | Admitting: *Deleted

## 2011-11-04 DIAGNOSIS — I10 Essential (primary) hypertension: Secondary | ICD-10-CM

## 2011-11-04 DIAGNOSIS — I2510 Atherosclerotic heart disease of native coronary artery without angina pectoris: Secondary | ICD-10-CM | POA: Insufficient documentation

## 2011-11-04 DIAGNOSIS — Z79899 Other long term (current) drug therapy: Secondary | ICD-10-CM | POA: Insufficient documentation

## 2011-11-04 DIAGNOSIS — R51 Headache: Secondary | ICD-10-CM | POA: Insufficient documentation

## 2011-11-04 DIAGNOSIS — Z794 Long term (current) use of insulin: Secondary | ICD-10-CM | POA: Insufficient documentation

## 2011-11-04 DIAGNOSIS — K219 Gastro-esophageal reflux disease without esophagitis: Secondary | ICD-10-CM | POA: Insufficient documentation

## 2011-11-04 DIAGNOSIS — Z8673 Personal history of transient ischemic attack (TIA), and cerebral infarction without residual deficits: Secondary | ICD-10-CM | POA: Insufficient documentation

## 2011-11-04 DIAGNOSIS — R739 Hyperglycemia, unspecified: Secondary | ICD-10-CM

## 2011-11-04 DIAGNOSIS — E119 Type 2 diabetes mellitus without complications: Secondary | ICD-10-CM | POA: Insufficient documentation

## 2011-11-04 DIAGNOSIS — F17200 Nicotine dependence, unspecified, uncomplicated: Secondary | ICD-10-CM | POA: Insufficient documentation

## 2011-11-04 DIAGNOSIS — R42 Dizziness and giddiness: Secondary | ICD-10-CM

## 2011-11-04 LAB — CBC
HCT: 43.1 % (ref 36.0–46.0)
Hemoglobin: 14.2 g/dL (ref 12.0–15.0)
MCHC: 32.9 g/dL (ref 30.0–36.0)
RDW: 14.7 % (ref 11.5–15.5)
WBC: 9.8 10*3/uL (ref 4.0–10.5)

## 2011-11-04 LAB — GLUCOSE, CAPILLARY
Glucose-Capillary: 341 mg/dL — ABNORMAL HIGH (ref 70–99)
Glucose-Capillary: 265 mg/dL — ABNORMAL HIGH (ref 70–99)

## 2011-11-04 LAB — COMPREHENSIVE METABOLIC PANEL
Albumin: 3.5 g/dL (ref 3.5–5.2)
Alkaline Phosphatase: 161 U/L — ABNORMAL HIGH (ref 39–117)
BUN: 14 mg/dL (ref 6–23)
Calcium: 9.3 mg/dL (ref 8.4–10.5)
GFR calc Af Amer: >90 mL/min (ref >90)
Potassium: 4.1 mEq/L (ref 3.5–5.1)
Total Protein: 7.5 g/dL (ref 6.0–8.3)

## 2011-11-04 LAB — URINALYSIS, ROUTINE W REFLEX MICROSCOPIC
Glucose, UA: >1000 mg/dL — AB
Ketones, ur: 15 mg/dL — AB
Leukocytes, UA: NEGATIVE
Nitrite: NEGATIVE
Specific Gravity, Urine: 1.024 (ref 1.005–1.030)
pH: 5.5 (ref 5.0–8.0)

## 2011-11-04 LAB — DIFFERENTIAL
Basophils Absolute: 0 10*3/uL (ref 0.0–0.1)
Lymphocytes Relative: 18 % (ref 12–46)
Lymphs Abs: 1.8 10*3/uL (ref 0.7–4.0)
Monocytes Absolute: 0.3 10*3/uL (ref 0.1–1.0)
Monocytes Relative: 3 % (ref 3–12)
Neutro Abs: 7.6 10*3/uL (ref 1.7–7.7)

## 2011-11-04 LAB — URINE MICROSCOPIC-ADD ON

## 2011-11-04 MED ORDER — SODIUM CHLORIDE 0.9 % IV BOLUS (SEPSIS)
1000.0000 mL | Freq: Once | INTRAVENOUS | Status: AC
  Administered 2011-11-04: 1000 mL via INTRAVENOUS

## 2011-11-04 MED ORDER — METOCLOPRAMIDE HCL 5 MG/ML IJ SOLN
10.0000 mg | Freq: Once | INTRAMUSCULAR | Status: AC
  Administered 2011-11-04: 10 mg via INTRAVENOUS
  Filled 2011-11-04: qty 2

## 2011-11-04 MED ORDER — OXYCODONE-ACETAMINOPHEN 5-325 MG PO TABS
1.0000 | ORAL_TABLET | Freq: Once | ORAL | Status: AC
  Administered 2011-11-04: 1 via ORAL
  Filled 2011-11-04: qty 1

## 2011-11-04 MED ORDER — ONDANSETRON HCL 4 MG/2ML IJ SOLN: 4.0000 mg | Freq: Once | INTRAMUSCULAR | Status: DC

## 2011-11-04 MED ORDER — INSULIN ASPART 100 UNIT/ML ~~LOC~~ SOLN
8.0000 [IU] | Freq: Once | SUBCUTANEOUS | Status: AC
  Administered 2011-11-04: 8 [IU] via SUBCUTANEOUS
  Filled 2011-11-04: qty 1

## 2011-11-04 MED ORDER — MORPHINE SULFATE 4 MG/ML IJ SOLN
4.0000 mg | Freq: Once | INTRAMUSCULAR | Status: AC
  Administered 2011-11-04: 4 mg via INTRAVENOUS
  Filled 2011-11-04: qty 1

## 2011-11-04 NOTE — ED Notes (Signed)
Pt reports onset of HA yesterday a.m. - pt states this a.m. She began to experienced dizziness as well. Pt w/o slurred or weakness noted on assessment- pt A&Ox4, w/o any obvious neurological deficits.

## 2011-11-04 NOTE — Discharge Instructions (Signed)
[name] your sugar was over 300 when he first got here. We gave you a units of cell see you for regular insulin. Take your blood pressure medication when you get home. Use her sliding scale insulin before meals today. Drink plenty of water and fluids today. The dizziness was worked up earlier this year with CTs and MRIs. We did not repeat this test today. Followup with your medical doctor this week.  Diabetes, Frequently Asked Questions WHAT IS DIABETES? Most of the food we eat is turned into glucose (sugar). Our bodies use it for energy. The pancreas makes a hormone called insulin. It helps glucose get into the cells of our bodies. When you have diabetes, your body either does not make enough insulin or cannot use its own insulin as well as it should. This causes sugars to build up in your blood. WHAT ARE THE SYMPTOMS OF DIABETES?  Frequent urination.   Excessive thirst.   Unexplained weight loss.   Extreme hunger.   Blurred vision.   Tingling or numbness in hands or feet.   Feeling very tired much of the time.   Dry, itchy skin.   Sores that are slow to heal.   Yeast infections.  WHAT ARE THE TYPES OF DIABETES? Type 1 Diabetes   About 10% of affected people have this type.   Usually occurs before the age of 55.   Usually occurs in thin to normal weight people.  Type 2 Diabetes  About 90% of affected people have this type.   Usually occurs after the age of 61.   Usually occurs in overweight people.   More likely to have:   A family history of diabetes.   A history of diabetes during pregnancy (gestational diabetes).   High blood pressure.   High cholesterol and triglycerides.  Gestational Diabetes  Occurs in about 4% of pregnancies.   Usually goes away after the baby is born.   More likely to occur in women with:   Family history of diabetes.   Previous gestational diabetes.   Obese.   Over 55 years old.  WHAT IS PRE-DIABETES? Pre-diabetes means  your blood glucose is higher than normal, but lower than the diabetes range. It also means you are at risk of getting type 2 diabetes and heart disease. If you are told you have pre-diabetes, have your blood glucose checked again in 1 to 2 years. WHAT IS THE TREATMENT FOR DIABETES? Treatment is aimed at keeping blood glucose near normal levels at all times. Learning how to manage this yourself is important in treating diabetes. Depending on the type of diabetes you have, your treatment will include one or more of the following:  Monitoring your blood glucose.   Meal planning.   Exercise.   Oral medicine (pills) or insulin.  CAN DIABETES BE PREVENTED? With type 1 diabetes, prevention is more difficult, because the triggers that cause it are not yet known. With type 2 diabetes, prevention is more likely, with lifestyle changes:  Maintain a healthy weight.   Eat healthy.   Exercise.  IS THERE A CURE FOR DIABETES? No, there is no cure for diabetes. There is a lot of research going on that is looking for a cure, and progress is being made. Diabetes can be treated and controlled. People with diabetes can manage their diabetes and lead normal, active lives. SHOULD I BE TESTED FOR DIABETES? If you are at least 57 years old, you should be tested for diabetes. You should be  tested again every 3 years. If you are 45 or older and overweight, you may want to get tested more often. If you are younger than 45, overweight, and have one or more of the following risk factors, you should be tested:  Family history of diabetes.   Inactive lifestyle.   High blood pressure.  WHAT ARE SOME OTHER SOURCES FOR INFORMATION ON DIABETES? The following organizations may help in your search for more information on diabetes: National Diabetes Education Program (NDEP) Internet: SolarDiscussions.es American Diabetes Association Internet: http://www.diabetes.org  Juvenile Diabetes Foundation  International Internet: WetlessWash.is Document Released: 05/08/2003 Document Revised: 04/24/2011 Document Reviewed: 03/02/2009 [facility] Patient Information 2012 [place], [place].Diabetes, Frequently Asked Questions WHAT IS DIABETES? Most of the food we eat is turned into glucose (sugar). Our bodies use it for energy. The pancreas makes a hormone called insulin. It helps glucose get into the cells of our bodies. When you have diabetes, your body either does not make enough insulin or cannot use its own insulin as well as it should. This causes sugars to build up in your blood. WHAT ARE THE SYMPTOMS OF DIABETES?  Frequent urination.   Excessive thirst.   Unexplained weight loss.   Extreme hunger.   Blurred vision.   Tingling or numbness in hands or feet.   Feeling very tired much of the time.   Dry, itchy skin.   Sores that are slow to heal.   Yeast infections.  WHAT ARE THE TYPES OF DIABETES? Type 1 Diabetes   About 10% of affected people have this type.   Usually occurs before the age of 69.   Usually occurs in thin to normal weight people.  Type 2 Diabetes  About 90% of affected people have this type.   Usually occurs after the age of 35.   Usually occurs in overweight people.   More likely to have:   A family history of diabetes.   A history of diabetes during pregnancy (gestational diabetes).   High blood pressure.   High cholesterol and triglycerides.  Gestational Diabetes  Occurs in about 4% of pregnancies.   Usually goes away after the baby is born.   More likely to occur in women with:   Family history of diabetes.   Previous gestational diabetes.   Obese.   Over 68 years old.  WHAT IS PRE-DIABETES? Pre-diabetes means your blood glucose is higher than normal, but lower than the diabetes range. It also means you are at risk of getting type 2 diabetes and heart disease. If you are told you have pre-diabetes, have your blood glucose checked  again in 1 to 2 years. WHAT IS THE TREATMENT FOR DIABETES? Treatment is aimed at keeping blood glucose near normal levels at all times. Learning how to manage this yourself is important in treating diabetes. Depending on the type of diabetes you have, your treatment will include one or more of the following:  Monitoring your blood glucose.   Meal planning.   Exercise.   Oral medicine (pills) or insulin.  CAN DIABETES BE PREVENTED? With type 1 diabetes, prevention is more difficult, because the triggers that cause it are not yet known. With type 2 diabetes, prevention is more likely, with lifestyle changes:  Maintain a healthy weight.   Eat healthy.   Exercise.  IS THERE A CURE FOR DIABETES? No, there is no cure for diabetes. There is a lot of research going on that is looking for a cure, and progress is being made. Diabetes can be  treated and controlled. People with diabetes can manage their diabetes and lead normal, active lives. SHOULD I BE TESTED FOR DIABETES? If you are at least 57 years old, you should be tested for diabetes. You should be tested again every 3 years. If you are 45 or older and overweight, you may want to get tested more often. If you are younger than 45, overweight, and have one or more of the following risk factors, you should be tested:  Family history of diabetes.   Inactive lifestyle.   High blood pressure.  WHAT ARE SOME OTHER SOURCES FOR INFORMATION ON DIABETES? The following organizations may help in your search for more information on diabetes: National Diabetes Education Program (NDEP) Internet: SolarDiscussions.es American Diabetes Association Internet: http://www.diabetes.org  Juvenile Diabetes Foundation International Internet: [employer] Document Released: 05/08/2003 Document Revised: 04/24/2011 Document Reviewed: 03/02/2009 [facility] Patient Information 2012 [place], [place].

## 2011-11-04 NOTE — ED Provider Notes (Signed)
Medical screening examination/treatment/procedure(s) were conducted as a shared visit with non-physician practitioner(s) and myself.  I personally evaluated the patient during the encounter  Pt with lightheadedness and headache. Suspect dehydration from decreased oral intake and hyperglycemia. Hydrated in ER. Improvement in symptoms. Dc home with close pcp follow up  1. Hyperglycemia   2. Hypertension   3. Dizziness - light-headed   4. Dehydration  Results for orders placed during the hospital encounter of 11/04/11  GLUCOSE, CAPILLARY      Component Value Range   Glucose-Capillary 341 (*) 70 - 99 mg/dL   Comment 1 Notify RN    CBC      Component Value Range   WBC 9.8  4.0 - 10.5 K/uL   RBC 5.08  3.87 - 5.11 MIL/uL   Hemoglobin 14.2  12.0 - 15.0 g/dL   HCT 16.1  09.6 - 04.5 %   MCV 84.8  78.0 - 100.0 fL   MCH 28.0  26.0 - 34.0 pg   MCHC 32.9  30.0 - 36.0 g/dL   RDW 40.9  81.1 - 91.4 %   Platelets 239  150 - 400 K/uL  DIFFERENTIAL      Component Value Range   Neutrophils Relative 78 (*) 43 - 77 %   Neutro Abs 7.6  1.7 - 7.7 K/uL   Lymphocytes Relative 18  12 - 46 %   Lymphs Abs 1.8  0.7 - 4.0 K/uL   Monocytes Relative 3  3 - 12 %   Monocytes Absolute 0.3  0.1 - 1.0 K/uL   Eosinophils Relative 0  0 - 5 %   Eosinophils Absolute 0.0  0.0 - 0.7 K/uL   Basophils Relative 0  0 - 1 %   Basophils Absolute 0.0  0.0 - 0.1 K/uL  COMPREHENSIVE METABOLIC PANEL      Component Value Range   Sodium 135  135 - 145 mEq/L   Potassium 4.1  3.5 - 5.1 mEq/L   Chloride 98  96 - 112 mEq/L   CO2 25  19 - 32 mEq/L   Glucose, Bld 315 (*) 70 - 99 mg/dL   BUN 14  6 - 23 mg/dL   Creatinine, Ser 7.82  0.50 - 1.10 mg/dL   Calcium 9.3  8.4 - 95.6 mg/dL   Total Protein 7.5  6.0 - 8.3 g/dL   Albumin 3.5  3.5 - 5.2 g/dL   AST 11  0 - 37 U/L   ALT 15  0 - 35 U/L   Alkaline Phosphatase 161 (*) 39 - 117 U/L   Total Bilirubin 0.4  0.3 - 1.2 mg/dL   GFR calc non Af Amer >90  >90 mL/min   GFR calc Af Amer  >90  >90 mL/min  URINALYSIS, ROUTINE W REFLEX MICROSCOPIC      Component Value Range   Color, Urine YELLOW  YELLOW   APPearance CLOUDY (*) CLEAR   Specific Gravity, Urine 1.024  1.005 - 1.030   pH 5.5  5.0 - 8.0   Glucose, UA >1000 (*) NEGATIVE mg/dL   Hgb urine dipstick TRACE (*) NEGATIVE   Bilirubin Urine NEGATIVE  NEGATIVE   Ketones, ur 15 (*) NEGATIVE mg/dL   Protein, ur NEGATIVE  NEGATIVE mg/dL   Urobilinogen, UA 1.0  0.0 - 1.0 mg/dL   Nitrite NEGATIVE  NEGATIVE   Leukocytes, UA NEGATIVE  NEGATIVE  GLUCOSE, CAPILLARY      Component Value Range   Glucose-Capillary 265 (*) 70 - 99 mg/dL  URINE MICROSCOPIC-ADD ON      Component Value Range   Squamous Epithelial / LPF FEW (*) RARE   WBC, UA 3-6  <3 WBC/hpf   RBC / HPF 3-6  <3 RBC/hpf   Bacteria, UA FEW (*) RARE   Urine-Other MANY YEAST         Lyanne Co, MD 11/04/11 2020

## 2011-11-04 NOTE — ED Provider Notes (Signed)
History     CSN: [id number]  Arrival date & time 11/04/11  4098   First MD Initiated Contact with Patient 11/04/11 0732      Chief Complaint  Patient presents with  . Dizziness  . Headache    (Consider location/radiation/quality/duration/timing/severity/associated sxs/prior treatment) Patient is a 56 y.o. female presenting with headaches. The history is provided by the patient. No language interpreter was used.  Headache  This is a new problem. The current episode started yesterday. The problem occurs constantly. The problem has not changed since onset.The headache is associated with nothing. The pain is located in the frontal region. The quality of the pain is described as throbbing. The pain is at a severity of 9/10. The pain is severe. The pain does not radiate. Associated symptoms include nausea. Pertinent negatives include no fever, no syncope and no vomiting. She has tried acetaminophen and NSAIDs for the symptoms. The treatment provided no relief.  Patient reports h/a and dizziness with head neck and back pain x > 24 hours.  Pain 1/-10.  States the headache is in th frontal area and radiates down the back of her head heck and back.  No relief with tylenol.  Took her lantus insulin last pm and sugar is > 300 presently.  Did not eat this am.  States the room does not feel like it is spinning.  Some nausea but no vomiting.  Was admitted for the same in 2/13.  Unable to ambulate.  Had a full workup recently with admission for the same.  MRI/MRA and CT head normal.     Past Medical History  Diagnosis Date  . Coronary atherosclerosis of native coronary artery     non obstructive by cath in 2008 and 2010  . Unspecified essential hypertension   . Other and unspecified hyperlipidemia   . Syncope and collapse     near-syncopal episode in November/2008  . Unspecified cardiovascular disease     Decreased left-ventricular fxn  . 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 Unspecified hereditary and idiopathic peripheral neuropathy   . Obesity, unspecified   . Esophageal reflux   . History of diabetes mellitus, type II   . Diaphragmatic hernia without mention of obstruction or gangrene   . Irritable bowel syndrome   . Morbid obesity     Past Surgical History  Procedure Date  . Invasive electrophysiologic study 5/09    followed by insertion of an implantable loop recorder. S/p removal   . Colonoscopy   . Cholecystectomy   . Multiple toe surgeries   . Cardiac catheterization     LAD 30%, circumflex 50%, OM 75%, RI 60% with small branch 80%, dominant RCA 60%, EF 45-50%    Family History  Problem Relation Age of Onset  . Pneumonia Mother   . Gallbladder disease Mother     cancer  . Heart failure Mother   . Diabetes Father   . Coronary artery disease Father   . Stroke Neg Hx     History  Substance Use Topics  . Smoking status: Current Everyday Smoker -- 1.0 packs/day for 30 years    Types: Cigarettes  . Smokeless tobacco: Not on file  . Alcohol Use: Yes    OB History    Grav Para Term Preterm Abortions TAB SAB Ect Mult Living                  Review of Systems  Constitutional:  Negative.  Negative for fever.  Eyes: Negative.   Respiratory: Negative.   Cardiovascular: Negative.  Negative for syncope.  Gastrointestinal: Positive for nausea. Negative for vomiting.  Musculoskeletal: Positive for back pain.  Neurological: Positive for dizziness and headaches. Negative for syncope, facial asymmetry, weakness, light-headedness and numbness.  Psychiatric/Behavioral: Negative.   All other systems reviewed and are negative.    Allergies  Banana; Penicillins; and Strawberry  Home Medications   Current Outpatient Rx  Name Route Sig Dispense Refill  . ACETAMINOPHEN 500 MG PO TABS Oral Take 1,000 mg by mouth every 6 (six) hours as needed. For pain    . AMLODIPINE BESYLATE 10 MG PO TABS Oral Take 1 tablet (10 mg  total) by mouth daily. 30 tablet 3  . ASPIRIN-DIPYRIDAMOLE ER 25-200 MG PO CP12 Oral Take 1 capsule by mouth daily.      Marland Kitchen GLIMEPIRIDE 4 MG PO TABS Oral Take 4 mg by mouth daily before breakfast.      . IBUPROFEN 800 MG PO TABS Oral Take 800 mg by mouth every 8 (eight) hours as needed. For pain    . INSULIN ASPART 100 UNIT/ML [place] SOLN Subcutaneous Inject 6 Units into the skin 3 (three) times daily with meals. 1 vial 3    Please also provide insulin syringes # 120 with PR ...  . INSULIN GLARGINE 100 UNIT/ML [place] SOLN Subcutaneous Inject 25 Units into the skin at bedtime. 10 mL 3  . LISINOPRIL 10 MG PO TABS Oral Take 10 mg by mouth daily.      Marland Kitchen METFORMIN HCL 500 MG PO TABS Oral Take 500 mg by mouth 2 (two) times daily with a meal.    . SIMVASTATIN 20 MG PO TABS Oral Take 20 mg by mouth at bedtime.        BP 151/74  Pulse 94  Temp 98.7 F (37.1 C) (Oral)  Resp 21  SpO2 99%  Physical Exam  Nursing note and vitals reviewed. Constitutional: She is oriented to person, place, and time. She appears well-developed and well-nourished.  HENT:  Head: Normocephalic and atraumatic.  Eyes: Conjunctivae and EOM are normal. Pupils are equal, round, and reactive to light.  Neck: Normal range of motion. Neck supple.  Cardiovascular: Normal rate.   Pulmonary/Chest: Effort normal and breath sounds normal. No respiratory distress.  Abdominal: Soft.  Musculoskeletal: Normal range of motion. She exhibits no edema and no tenderness.  Neurological: She is alert and oriented to person, place, and time. She has normal reflexes. She displays normal reflexes. No cranial nerve deficit. Coordination normal.  Skin: Skin is warm and dry.  Psychiatric: She has a normal mood and affect.    ED Course  Procedures (including critical care time)  Labs Reviewed  GLUCOSE, CAPILLARY - Abnormal; Notable for the following:    Glucose-Capillary 341 (*)     All other components within normal limits  CBC  DIFFERENTIAL    COMPREHENSIVE METABOLIC PANEL  URINALYSIS, ROUTINE W REFLEX MICROSCOPIC   No results found.   No diagnosis found.    MDM   Date: 11/04/2011  Rate: 97  Rhythm: normal sinus rhythm  QRS Axis: normal  Intervals: normal  ST/T Wave abnormalities: normal  Conduction Disutrbances:none  Narrative Interpretation:   Old EKG Reviewed: unchanged Light headed,neck back pain with hyperglycemia/dehydration .  Better after 2 liters of fluid. Regular insulin 8 units sq.   Ambulating without difficulty in the ER.  Hypertensive will take her meds at home.  Neuro in  tact.  Dizziness better.  Ready for discharge.  Will follow up with pcp this week.  Needs work note.  Shared visit with Dr. Patria Mane.    Labs Reviewed  GLUCOSE, CAPILLARY - Abnormal; Notable for the following:    Glucose-Capillary 341 (*)     All other components within normal limits  DIFFERENTIAL - Abnormal; Notable for the following:    Neutrophils Relative 78 (*)     All other components within normal limits  COMPREHENSIVE METABOLIC PANEL - Abnormal; Notable for the following:    Glucose, Bld 315 (*)     Alkaline Phosphatase 161 (*)     All other components within normal limits  URINALYSIS, ROUTINE W REFLEX MICROSCOPIC - Abnormal; Notable for the following:    APPearance CLOUDY (*)     Glucose, UA >1000 (*)     Hgb urine dipstick TRACE (*)     Ketones, ur 15 (*)     All other components within normal limits  GLUCOSE, CAPILLARY - Abnormal; Notable for the following:    Glucose-Capillary 265 (*)     All other components within normal limits  URINE MICROSCOPIC-ADD ON - Abnormal; Notable for the following:    Squamous Epithelial / LPF FEW (*)     Bacteria, UA FEW (*)     All other components within normal limits  CBC           Remi Haggard, NP 11/04/11 1634

## 2011-11-04 NOTE — ED Notes (Signed)
EDPA notified of pts BP and persistant HA. No additional orders given. Will continue to monitor pt and pts status.

## 2011-11-04 NOTE — ED Notes (Signed)
[place] NP at bedside.

## 2011-11-26 ENCOUNTER — Encounter (HOSPITAL_COMMUNITY): Payer: Self-pay | Admitting: *Deleted

## 2011-11-26 ENCOUNTER — Emergency Department (HOSPITAL_COMMUNITY): Payer: Managed Care, Other (non HMO)

## 2011-11-26 ENCOUNTER — Emergency Department (HOSPITAL_COMMUNITY)
Admission: EM | Admit: 2011-11-26 | Discharge: 2011-11-26 | Disposition: A | Discharge status: Home / Self Care | Payer: Managed Care, Other (non HMO) | Attending: Emergency Medicine | Admitting: Emergency Medicine

## 2011-11-26 DIAGNOSIS — R42 Dizziness and giddiness: Secondary | ICD-10-CM | POA: Insufficient documentation

## 2011-11-26 DIAGNOSIS — Z7982 Long term (current) use of aspirin: Secondary | ICD-10-CM | POA: Insufficient documentation

## 2011-11-26 DIAGNOSIS — R51 Headache: Secondary | ICD-10-CM | POA: Insufficient documentation

## 2011-11-26 DIAGNOSIS — I10 Essential (primary) hypertension: Secondary | ICD-10-CM | POA: Insufficient documentation

## 2011-11-26 DIAGNOSIS — R079 Chest pain, unspecified: Secondary | ICD-10-CM | POA: Insufficient documentation

## 2011-11-26 DIAGNOSIS — K589 Irritable bowel syndrome without diarrhea: Secondary | ICD-10-CM | POA: Insufficient documentation

## 2011-11-26 DIAGNOSIS — F17200 Nicotine dependence, unspecified, uncomplicated: Secondary | ICD-10-CM | POA: Insufficient documentation

## 2011-11-26 DIAGNOSIS — R739 Hyperglycemia, unspecified: Secondary | ICD-10-CM

## 2011-11-26 DIAGNOSIS — E119 Type 2 diabetes mellitus without complications: Secondary | ICD-10-CM | POA: Insufficient documentation

## 2011-11-26 DIAGNOSIS — Z794 Long term (current) use of insulin: Secondary | ICD-10-CM | POA: Insufficient documentation

## 2011-11-26 LAB — CBC WITH DIFFERENTIAL/PLATELET
Basophils Absolute: 0 10*3/uL (ref 0.0–0.1)
Basophils Relative: 0 % (ref 0–1)
Eosinophils Absolute: 0 10*3/uL (ref 0.0–0.7)
Eosinophils Relative: 0 % (ref 0–5)
HCT: 38.8 % (ref 36.0–46.0)
Hemoglobin: 12.7 g/dL (ref 12.0–15.0)
MCH: 27.9 pg (ref 26.0–34.0)
MCHC: 32.7 g/dL (ref 30.0–36.0)
MCV: 85.1 fL (ref 78.0–100.0)
Monocytes Absolute: 0.3 10*3/uL (ref 0.1–1.0)
Monocytes Relative: 3 % (ref 3–12)
RDW: 15 % (ref 11.5–15.5)

## 2011-11-26 LAB — TROPONIN I: Troponin I: <0.3 ng/mL (ref <0.30)

## 2011-11-26 LAB — BASIC METABOLIC PANEL
BUN: 15 mg/dL (ref 6–23)
Calcium: 9 mg/dL (ref 8.4–10.5)
Chloride: 100 mEq/L (ref 96–112)
Creatinine, Ser: 0.64 mg/dL (ref 0.50–1.10)
GFR calc Af Amer: >90 mL/min (ref >90)

## 2011-11-26 LAB — GLUCOSE, CAPILLARY: Glucose-Capillary: 216 mg/dL — ABNORMAL HIGH (ref 70–99)

## 2011-11-26 MED ORDER — MORPHINE SULFATE 4 MG/ML IJ SOLN
4.0000 mg | Freq: Once | INTRAMUSCULAR | Status: AC
  Administered 2011-11-26: 4 mg via INTRAVENOUS
  Filled 2011-11-26: qty 1

## 2011-11-26 MED ORDER — DIAZEPAM 5 MG/ML IJ SOLN
5.0000 mg | Freq: Once | INTRAMUSCULAR | Status: AC
  Administered 2011-11-26: 5 mg via INTRAVENOUS
  Filled 2011-11-26: qty 2

## 2011-11-26 MED ORDER — SODIUM CHLORIDE 0.9 % IV BOLUS (SEPSIS)
1000.0000 mL | Freq: Once | INTRAVENOUS | Status: AC
  Administered 2011-11-26: 1000 mL via INTRAVENOUS

## 2011-11-26 MED ORDER — ONDANSETRON HCL 4 MG/2ML IJ SOLN
4.0000 mg | Freq: Once | INTRAMUSCULAR | Status: AC
  Administered 2011-11-26: 4 mg via INTRAVENOUS
  Filled 2011-11-26: qty 2

## 2011-11-26 MED ORDER — TRAMADOL HCL 50 MG PO TABS
50.0000 mg | ORAL_TABLET | Freq: Once | ORAL | Status: AC
  Administered 2011-11-26: 50 mg via ORAL
  Filled 2011-11-26: qty 1

## 2011-11-26 MED ORDER — MECLIZINE HCL 25 MG PO TABS
25.0000 mg | ORAL_TABLET | Freq: Once | ORAL | Status: AC
  Administered 2011-11-26: 25 mg via ORAL
  Filled 2011-11-26: qty 1

## 2011-11-26 NOTE — ED Notes (Signed)
PA at bedside.

## 2011-11-26 NOTE — ED Notes (Addendum)
Reports onset left side CP, nausea no emesis & dizziness (room spinning) while at work. Denies SOB, diaphoresis, fever, cold, cough.  States left side h/a started enroute to ED. Resp e/u, no distress. Chest very tender to palpation, denies injury.

## 2011-11-26 NOTE — ED Provider Notes (Signed)
Patient with a hx sig for vertigo, CAD, obesity, and poorly contoled type 2 diabetes presented to the emergency department with the chief complaint of vertigo.  Physical exam was positive for nystagmus but no focal neuro deficits per previous provider.  Patient's chest pain is reproducible and history non-concerning for coronary artery disease.  Patient was placed in CDU on observation by Grant Fontana, PA-C. Patient care resumed from pod a .  Patient is here for symptomatic relief of vertigo and has received fluids, meclizine, and Valium. Discussed need for possible head imaging or cardiac r/o with provider who does not seem to believe a emergent etiology for pts symptoms. Patient re-evaluated and is resting comfortable, VSS, with no new complaints or concerns at this time. After re-evaluation I agree that pt only needs symptomatic observation & imaging is not indicated at this time.  Plan per previous provider is to ambulate patient to assess ability for disposition. On exam: hemodynamically stable, NAD, heart w/ RRR, lungs CTAB, Chest & abd non-tender, no peripheral edema or calf tenderness.   6:06 PM Pt requesting meal. Sitting comfortable in bed. Will ambulate after meal.   7:24 PM Observed pt ambulating with normal gait, no dysequilibrium or ataxia evident. Pt still not feeling at baseline stating "her vertigo is back."  3rd trop pending, will allow pt to rest and will re-evaluate  8:30 PM Pt requested meal  9:40 PM  Pt chart reviewed and she has had multiple head CTs without evidence of intracranial abnormalities. MR brain performed on 07/02/11 also with no acute ischemia.Pt has been to ED for HA and dizziness with admission previously without any diagnostic findings. Pt here on 6/18 for same symptoms. Pt is currently asymptomatic with no focal neuro deficits on re exam: CN III-XII, coordination, nl gait.  Neg Trop x3, non concerning for CAD etiology of CP. Strict return precautions discussed  with patient.   Jaci Carrel, [place] 11/26/11 2152

## 2011-11-26 NOTE — ED Notes (Signed)
Pt. States she was very dizzy and could not stand without assistance. Pt took 2 small steps in room but had to return to bed. Call light within reach.

## 2011-11-26 NOTE — ED Provider Notes (Signed)
History     CSN: [id number]  Arrival date & time 11/26/11  1100   First MD Initiated Contact with Patient 11/26/11 1118      Chief Complaint  Patient presents with  . Chest Pain    (Consider location/radiation/quality/duration/timing/severity/associated sxs/prior treatment) HPI History from patient. 56 year old female with past medical history of atherosclerosis, hypertension, hyperlipidemia, diabetes who presents with complaint of lightheadedness and chest pain. She states that she was taking a break while at work this morning and went outside to smoke a cigarette. She came back in to the office and began to feel dizzy. Dizzy sensation is described as a sensation of the room spinning. She has presented to the ED for the same in the past, was hydrated, and improved with this. She has been diagnosed with vertigo before and states this feels somewhat similar. Denies tinnitus or hearing loss.   She later noted associated chest pain. Chest pain is located to the left side of the chest and radiates to underneath the breast. Occasionally shoots down her arm. It is described as aching and worsens with any movement or palpation. Patient was given 324 mg of aspirin prior to EMS arriving on the scene. She had no associated shortness of breath, palpitations, abdominal pain, nausea, or vomiting.  Currently c/o slight generalized HA. No visual change, photophobia, phonophobia, nausea, neck pain.  Past Medical History  Diagnosis Date  . Coronary atherosclerosis of native coronary artery     non obstructive by cath in 2008 and 2010  . Unspecified essential hypertension   . Other and unspecified hyperlipidemia   . Syncope and collapse     near-syncopal episode in November/2008  . Unspecified cardiovascular disease     Decreased left-ventricular fxn  . 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 Unspecified hereditary and idiopathic peripheral neuropathy   .  Obesity, unspecified   . Esophageal reflux   . History of diabetes mellitus, type II   . Diaphragmatic hernia without mention of obstruction or gangrene   . Irritable bowel syndrome   . Morbid obesity     Past Surgical History  Procedure Date  . Invasive electrophysiologic study 5/09    followed by insertion of an implantable loop recorder. S/p removal   . Colonoscopy   . Cholecystectomy   . Multiple toe surgeries   . Cardiac catheterization     LAD 30%, circumflex 50%, OM 75%, RI 60% with small branch 80%, dominant RCA 60%, EF 45-50%    Family History  Problem Relation Age of Onset  . Pneumonia Mother   . Gallbladder disease Mother     cancer  . Heart failure Mother   . Diabetes Father   . Coronary artery disease Father   . Stroke Neg Hx     History  Substance Use Topics  . Smoking status: Current Everyday Smoker -- 1.0 packs/day for 30 years    Types: Cigarettes  . Smokeless tobacco: Not on file  . Alcohol Use: Yes    OB History    Grav Para Term Preterm Abortions TAB SAB Ect Mult Living                  Review of Systems  Constitutional: Negative for fever, chills, activity change and appetite change.  Respiratory: Negative for chest tightness and shortness of breath.   Cardiovascular: Positive for chest pain. Negative for palpitations and leg swelling.  ROS as per HPI, otherwise negative  Allergies  Banana; Penicillins; and Strawberry  Home Medications   Current Outpatient Rx  Name Route Sig Dispense Refill  . ASPIRIN 81 MG PO CHEW Oral Chew 325 mg by mouth once.    Marland Kitchen AMLODIPINE BESYLATE 10 MG PO TABS Oral Take 1 tablet (10 mg total) by mouth daily. 30 tablet 3  . ASPIRIN-DIPYRIDAMOLE ER 25-200 MG PO CP12 Oral Take 1 capsule by mouth daily.      Marland Kitchen GLIMEPIRIDE 4 MG PO TABS Oral Take 4 mg by mouth daily before breakfast.      . IBUPROFEN 800 MG PO TABS Oral Take 800 mg by mouth every 8 (eight) hours as needed. For pain    . INSULIN ASPART 100 UNIT/ML [place]  SOLN Subcutaneous Inject 6 Units into the skin 3 (three) times daily with meals. 1 vial 3    Please also provide insulin syringes # 120 with PR ...  . INSULIN GLARGINE 100 UNIT/ML [place] SOLN Subcutaneous Inject 25 Units into the skin at bedtime. 10 mL 3  . LISINOPRIL 10 MG PO TABS Oral Take 10 mg by mouth daily.      Marland Kitchen METFORMIN HCL 500 MG PO TABS Oral Take 500 mg by mouth 2 (two) times daily with a meal.    . SIMVASTATIN 20 MG PO TABS Oral Take 20 mg by mouth at bedtime.        BP 165/78  Pulse 89  Temp 97.8 F (36.6 C) (Oral)  Resp 16  SpO2 100%  Physical Exam  Nursing note and vitals reviewed. Constitutional: She is oriented to person, place, and time. She appears well-developed and well-nourished. No distress.  HENT:  Head: Normocephalic and atraumatic.  Neck: Normal range of motion.  Cardiovascular: Normal rate, regular rhythm and normal heart sounds.  Exam reveals no gallop and no friction rub.   No murmur heard. Pulmonary/Chest: Effort normal and breath sounds normal.       Reproducibly tender to palpation over the left chest wall  Abdominal: Soft. Bowel sounds are normal. There is no tenderness. There is no rebound and no guarding.  Musculoskeletal: Normal range of motion.  Neurological: She is alert and oriented to person, place, and time. No cranial nerve deficit. She exhibits normal muscle tone. Coordination normal.       Nystagmus noted with visual field testing  Skin: Skin is warm and dry. She is not diaphoretic.  Psychiatric: She has a normal mood and affect.    ED Course  Procedures (including critical care time)   Date: 11/26/2011  Rate: 88  Rhythm: normal sinus rhythm  QRS Axis: normal  Intervals: normal  ST/T Wave abnormalities: normal  Conduction Disutrbances:none  Narrative Interpretation:   Old EKG Reviewed: as compared with Feb 2013 rate increased, no sig changes  Labs Reviewed  CBC WITH DIFFERENTIAL - Abnormal; Notable for the following:    WBC 10.7  (*)     Neutrophils Relative 78 (*)     Neutro Abs 8.4 (*)     All other components within normal limits  BASIC METABOLIC PANEL - Abnormal; Notable for the following:    Glucose, Bld 266 (*)     All other components within normal limits  GLUCOSE, CAPILLARY - Abnormal; Notable for the following:    Glucose-Capillary 216 (*)     All other components within normal limits  TROPONIN I  TROPONIN I  TROPONIN I  LAB REPORT - SCANNED   Dg Chest 2 View  11/26/2011  *RADIOLOGY REPORT*  Clinical Data: Shortness of breath and weakness.  CHEST - 2 VIEW  Comparison: PA and lateral chest 05/29/2011.  Findings: Lungs are clear.  Heart size is normal.  No pneumothorax or pleural fluid.  IMPRESSION: Negative chest.  Original Report Authenticated By: Bernadene Bell. D'ALESSIO, M.D.     1. Dizziness   2. Hyperglycemia   3. HA (headache)       MDM  Patient with sudden dizziness this morning, described as the room spinning. She has a history of vertigo in the past and states this feels similar. Has had imaging including MR brain for vertigo without acute findings in the past. Her sx are associated with chest discomfort. Her chest pain is very reproducible on exam to palpation, and worsens with movement. Labs unremarkable. Patient medicated with meclizine and has some residual dizziness with this. Remedicated with Ultram for slight headache and Valium for dizziness. Anticipate that if she can ambulate without difficulty, she can be discharged home. Moved to CDU for further medication; discussed with Earlie Lou, CDU provider.       Grant Fontana, PA-C 11/27/11 [phone]

## 2011-11-26 NOTE — ED Notes (Addendum)
C/o Pt at work, went outside & after smoking cigarette c/o left side CP & dizziness. Pain worse with mvmt, tender to palpation, deep inspiration. Pt took ASA 324mg  prior to EMS arrival

## 2011-11-26 NOTE — ED Notes (Signed)
Pt ambulated from her room to the restroom. Pt encouraged to use focal points and not to close her eyes. Pt able to go from lying to sitting without complaint. When she initally stood up she "staggered" a bit then walked to restroom with nurse holding her elbow. She did the "stagger" 1 time when walking back to her room.

## 2011-11-26 NOTE — ED Notes (Signed)
Patient complains of pain in her headache since 1030 this morning. Also states she has been dizzy "all day". States has had "a little" nausea. Denies vomiting or diarrhea. Also complains of left lateral chest pain in area of her left breast. Does increase with movement of her arm. 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

## 2011-11-26 NOTE — ED Notes (Signed)
Patient transported to X-ray 

## 2011-11-27 NOTE — ED Provider Notes (Signed)
Medical screening examination/treatment/procedure(s) were conducted as a shared visit with non-physician practitioner(s) and myself.  I personally evaluated the patient during the encounter  Chest pain completely reproduced with palpation of L chest wall and with ROM of L arm. Do not think symptoms are related to CAD  Loren Racer, MD 11/27/11 1331

## 2011-11-27 NOTE — ED Provider Notes (Signed)
Medical screening examination/treatment/procedure(s) were performed by non-physician practitioner and as supervising physician I was immediately available for consultation/collaboration.  Geoffery Lyons, MD 11/27/11 [phone]

## 2011-12-15 ENCOUNTER — Emergency Department (HOSPITAL_BASED_OUTPATIENT_CLINIC_OR_DEPARTMENT_OTHER): Payer: Managed Care, Other (non HMO)

## 2011-12-15 ENCOUNTER — Inpatient Hospital Stay (HOSPITAL_BASED_OUTPATIENT_CLINIC_OR_DEPARTMENT_OTHER)
Admission: EM | Admit: 2011-12-15 | Discharge: 2011-12-17 | DRG: 065 | Disposition: A | Discharge status: Home / Self Care | Payer: Managed Care, Other (non HMO) | Attending: Internal Medicine | Admitting: Internal Medicine

## 2011-12-15 ENCOUNTER — Observation Stay (HOSPITAL_COMMUNITY): Payer: Managed Care, Other (non HMO)

## 2011-12-15 ENCOUNTER — Encounter (HOSPITAL_BASED_OUTPATIENT_CLINIC_OR_DEPARTMENT_OTHER): Payer: Self-pay | Admitting: Emergency Medicine

## 2011-12-15 DIAGNOSIS — R55 Syncope and collapse: Secondary | ICD-10-CM

## 2011-12-15 DIAGNOSIS — E1149 Type 2 diabetes mellitus with other diabetic neurological complication: Secondary | ICD-10-CM | POA: Diagnosis present

## 2011-12-15 DIAGNOSIS — R200 Anesthesia of skin: Secondary | ICD-10-CM

## 2011-12-15 DIAGNOSIS — E785 Hyperlipidemia, unspecified: Secondary | ICD-10-CM | POA: Diagnosis present

## 2011-12-15 DIAGNOSIS — B3749 Other urogenital candidiasis: Secondary | ICD-10-CM | POA: Diagnosis present

## 2011-12-15 DIAGNOSIS — I6350 Cerebral infarction due to unspecified occlusion or stenosis of unspecified cerebral artery: Principal | ICD-10-CM | POA: Diagnosis present

## 2011-12-15 DIAGNOSIS — R739 Hyperglycemia, unspecified: Secondary | ICD-10-CM

## 2011-12-15 DIAGNOSIS — I2510 Atherosclerotic heart disease of native coronary artery without angina pectoris: Secondary | ICD-10-CM | POA: Diagnosis present

## 2011-12-15 DIAGNOSIS — IMO0002 Reserved for concepts with insufficient information to code with codable children: Secondary | ICD-10-CM | POA: Diagnosis present

## 2011-12-15 DIAGNOSIS — E1142 Type 2 diabetes mellitus with diabetic polyneuropathy: Secondary | ICD-10-CM | POA: Diagnosis present

## 2011-12-15 DIAGNOSIS — Z794 Long term (current) use of insulin: Secondary | ICD-10-CM

## 2011-12-15 DIAGNOSIS — Z79899 Other long term (current) drug therapy: Secondary | ICD-10-CM

## 2011-12-15 DIAGNOSIS — I10 Essential (primary) hypertension: Secondary | ICD-10-CM | POA: Diagnosis present

## 2011-12-15 DIAGNOSIS — I639 Cerebral infarction, unspecified: Secondary | ICD-10-CM | POA: Diagnosis present

## 2011-12-15 DIAGNOSIS — R29810 Facial weakness: Secondary | ICD-10-CM

## 2011-12-15 DIAGNOSIS — E669 Obesity, unspecified: Secondary | ICD-10-CM | POA: Diagnosis present

## 2011-12-15 DIAGNOSIS — G459 Transient cerebral ischemic attack, unspecified: Secondary | ICD-10-CM

## 2011-12-15 DIAGNOSIS — E1140 Type 2 diabetes mellitus with diabetic neuropathy, unspecified: Secondary | ICD-10-CM | POA: Diagnosis present

## 2011-12-15 DIAGNOSIS — K219 Gastro-esophageal reflux disease without esophagitis: Secondary | ICD-10-CM | POA: Diagnosis present

## 2011-12-15 DIAGNOSIS — R202 Paresthesia of skin: Secondary | ICD-10-CM | POA: Diagnosis not present

## 2011-12-15 DIAGNOSIS — Z7982 Long term (current) use of aspirin: Secondary | ICD-10-CM

## 2011-12-15 DIAGNOSIS — E1165 Type 2 diabetes mellitus with hyperglycemia: Secondary | ICD-10-CM

## 2011-12-15 DIAGNOSIS — R4789 Other speech disturbances: Secondary | ICD-10-CM | POA: Diagnosis present

## 2011-12-15 DIAGNOSIS — F17200 Nicotine dependence, unspecified, uncomplicated: Secondary | ICD-10-CM | POA: Diagnosis present

## 2011-12-15 DIAGNOSIS — E860 Dehydration: Secondary | ICD-10-CM

## 2011-12-15 DIAGNOSIS — Z8673 Personal history of transient ischemic attack (TIA), and cerebral infarction without residual deficits: Secondary | ICD-10-CM

## 2011-12-15 DIAGNOSIS — R42 Dizziness and giddiness: Secondary | ICD-10-CM

## 2011-12-15 LAB — PROTIME-INR
INR: 0.94 (ref 0.00–1.49)
Prothrombin Time: 12.8 seconds (ref 11.6–15.2)

## 2011-12-15 LAB — URINALYSIS, ROUTINE W REFLEX MICROSCOPIC
Glucose, UA: NEGATIVE mg/dL
Hgb urine dipstick: NEGATIVE
Specific Gravity, Urine: 1.022 (ref 1.005–1.030)

## 2011-12-15 LAB — TROPONIN I: Troponin I: <0.3 ng/mL (ref <0.30)

## 2011-12-15 LAB — COMPREHENSIVE METABOLIC PANEL
Albumin: 3.7 g/dL (ref 3.5–5.2)
BUN: 14 mg/dL (ref 6–23)
Creatinine, Ser: 0.8 mg/dL (ref 0.50–1.10)
Potassium: 4.4 mEq/L (ref 3.5–5.1)
Total Protein: 7.9 g/dL (ref 6.0–8.3)

## 2011-12-15 LAB — RAPID URINE DRUG SCREEN, HOSP PERFORMED: Opiates: NOT DETECTED

## 2011-12-15 LAB — URINE MICROSCOPIC-ADD ON

## 2011-12-15 LAB — GLUCOSE, CAPILLARY
Glucose-Capillary: 248 mg/dL — ABNORMAL HIGH (ref 70–99)
Glucose-Capillary: 240 mg/dL — ABNORMAL HIGH (ref 70–99)
Glucose-Capillary: 72 mg/dL (ref 70–99)

## 2011-12-15 LAB — CREATININE, SERUM
Creatinine, Ser: 0.77 mg/dL (ref 0.50–1.10)
GFR calc Af Amer: >90 mL/min (ref >90)

## 2011-12-15 LAB — DIFFERENTIAL
Basophils Relative: 0 % (ref 0–1)
Eosinophils Absolute: 0 10*3/uL (ref 0.0–0.7)
Monocytes Relative: 3 % (ref 3–12)
Neutrophils Relative %: 77 % (ref 43–77)

## 2011-12-15 LAB — CK TOTAL AND CKMB (NOT AT ARMC)
CK, MB: 1.8 ng/mL (ref 0.3–4.0)
Total CK: 49 U/L (ref 7–177)

## 2011-12-15 LAB — CBC
Hemoglobin: 13.6 g/dL (ref 12.0–15.0)
MCH: 27.9 pg (ref 26.0–34.0)
MCHC: 33.2 g/dL (ref 30.0–36.0)
Platelets: 203 10*3/uL (ref 150–400)
RBC: 4.33 MIL/uL (ref 3.87–5.11)
RDW: 14.9 % (ref 11.5–15.5)
WBC: 10.2 10*3/uL (ref 4.0–10.5)

## 2011-12-15 MED ORDER — SIMVASTATIN 20 MG PO TABS
20.0000 mg | ORAL_TABLET | Freq: Every day | ORAL | Status: DC
  Filled 2011-12-15 (×2): qty 1

## 2011-12-15 MED ORDER — ASPIRIN-DIPYRIDAMOLE ER 25-200 MG PO CP12
1.0000 | ORAL_CAPSULE | Freq: Every day | ORAL | Status: DC
  Administered 2011-12-16 – 2011-12-17 (×2): 1 via ORAL
  Filled 2011-12-15 (×3): qty 1

## 2011-12-15 MED ORDER — DIPHENHYDRAMINE HCL 50 MG/ML IJ SOLN
12.5000 mg | Freq: Once | INTRAMUSCULAR | Status: AC
  Administered 2011-12-15: 50 mg via INTRAVENOUS
  Filled 2011-12-15: qty 1

## 2011-12-15 MED ORDER — SODIUM CHLORIDE 0.9 % IV SOLN
INTRAVENOUS | Status: DC
  Administered 2011-12-15: 21:00:00 via INTRAVENOUS

## 2011-12-15 MED ORDER — MORPHINE SULFATE 2 MG/ML IJ SOLN
1.0000 mg | INTRAMUSCULAR | Status: DC | PRN
  Administered 2011-12-15: 1 mg via INTRAVENOUS
  Administered 2011-12-16: 2 mg via INTRAVENOUS
  Administered 2011-12-16 – 2011-12-17 (×3): 1 mg via INTRAVENOUS
  Filled 2011-12-15 (×5): qty 1

## 2011-12-15 MED ORDER — INSULIN REGULAR HUMAN 100 UNIT/ML IJ SOLN
INTRAMUSCULAR | Status: AC
  Filled 2011-12-15: qty 1

## 2011-12-15 MED ORDER — GLIMEPIRIDE 4 MG PO TABS
4.0000 mg | ORAL_TABLET | Freq: Every day | ORAL | Status: DC
  Administered 2011-12-16 – 2011-12-17 (×2): 4 mg via ORAL
  Filled 2011-12-15 (×3): qty 1

## 2011-12-15 MED ORDER — SODIUM CHLORIDE 0.9 % IV BOLUS (SEPSIS)
1000.0000 mL | Freq: Once | INTRAVENOUS | Status: AC
  Administered 2011-12-15: 1000 mL via INTRAVENOUS

## 2011-12-15 MED ORDER — INSULIN GLARGINE 100 UNIT/ML ~~LOC~~ SOLN
25.0000 [IU] | Freq: Every day | SUBCUTANEOUS | Status: DC
  Administered 2011-12-16: 25 [IU] via SUBCUTANEOUS

## 2011-12-15 MED ORDER — METOCLOPRAMIDE HCL 5 MG/ML IJ SOLN
10.0000 mg | Freq: Once | INTRAMUSCULAR | Status: AC
  Administered 2011-12-15: 10 mg via INTRAVENOUS
  Filled 2011-12-15: qty 2

## 2011-12-15 MED ORDER — ONDANSETRON HCL 4 MG/2ML IJ SOLN
4.0000 mg | Freq: Three times a day (TID) | INTRAMUSCULAR | Status: AC | PRN
  Administered 2011-12-15: 4 mg via INTRAVENOUS
  Filled 2011-12-15: qty 2

## 2011-12-15 MED ORDER — INSULIN REGULAR HUMAN 100 UNIT/ML IJ SOLN
10.0000 [IU] | Freq: Once | INTRAMUSCULAR | Status: AC
  Administered 2011-12-15: 10 [IU] via SUBCUTANEOUS

## 2011-12-15 MED ORDER — ASPIRIN 81 MG PO CHEW: 325.0000 mg | CHEWABLE_TABLET | Freq: Once | ORAL | Status: DC

## 2011-12-15 MED ORDER — INSULIN ASPART 100 UNIT/ML ~~LOC~~ SOLN
6.0000 [IU] | Freq: Three times a day (TID) | SUBCUTANEOUS | Status: DC
  Administered 2011-12-16 – 2011-12-17 (×3): 6 [IU] via SUBCUTANEOUS

## 2011-12-15 MED ORDER — SODIUM CHLORIDE 0.9 % IV SOLN: INTRAVENOUS | Status: AC

## 2011-12-15 MED ORDER — ENOXAPARIN SODIUM 30 MG/0.3ML ~~LOC~~ SOLN
30.0000 mg | Freq: Two times a day (BID) | SUBCUTANEOUS | Status: DC
  Administered 2011-12-16 – 2011-12-17 (×4): 30 mg via SUBCUTANEOUS
  Filled 2011-12-15 (×7): qty 0.3

## 2011-12-15 MED ORDER — LISINOPRIL 10 MG PO TABS
10.0000 mg | ORAL_TABLET | Freq: Every day | ORAL | Status: DC
  Administered 2011-12-16 – 2011-12-17 (×2): 10 mg via ORAL
  Filled 2011-12-15 (×3): qty 1

## 2011-12-15 NOTE — ED Notes (Signed)
States an hour ago while working starting having dizziness with nausea, left hand numbness and bilateral arm pain.  Additionally has a headache and a sensation that something is "Stuck in her throat"

## 2011-12-15 NOTE — ED Notes (Signed)
Report given to [place] on [address].

## 2011-12-15 NOTE — Consult Note (Signed)
Chief Complaint: Facial droop and left-sided numbness.  HPI: [name] is an 56 y.o. female a history of apparent TIA 5 years ago, diabetes mellitus coronary artery disease and marked obesity, who presented to [facility] with acute onset of facial weakness and numbness involving left arm and leg. Patient has been on Aggrenox twice a day as well as aspirin 81 mg per day. Scan of her head showed no acute intracranial abnormality. She was referred to [facility] for further evaluation to rule out acute stroke. Deficits resolved prior to arriving at [facility]. NIH stroke scale was 2 on arrival at [facility] and 0 at [facility]. Patient had an MRI as well as MRA of her brain in February 2013 which were unremarkable.   LSN: 9 AM today tPA Given: No: Resolution of deficits MRankin: 0  Past Medical History  Diagnosis Date  . Coronary atherosclerosis of native coronary artery     non obstructive by cath in 2008 and 2010  . Unspecified essential hypertension   . Other and unspecified hyperlipidemia   . Syncope and collapse     near-syncopal episode in November/2008  . Unspecified cardiovascular disease     Decreased left-ventricular fxn  . 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 Unspecified hereditary and idiopathic peripheral neuropathy   . Obesity, unspecified   . Esophageal reflux   . History of diabetes mellitus, type II   . Diaphragmatic hernia without mention of obstruction or gangrene   . Irritable bowel syndrome   . Morbid obesity     Family History  Problem Relation Age of Onset  . Pneumonia Mother   . Gallbladder disease Mother     cancer  . Heart failure Mother   . Diabetes Father   . Coronary artery disease Father   . Stroke Neg Hx      Medications:  Prior to Admission:  Prescriptions prior to admission  Medication Sig Dispense Refill  . aspirin 81 MG chewable tablet Chew  325 mg by mouth once.       . dipyridamole-aspirin (AGGRENOX) 25-200 MG per 12 hr capsule Take 1 capsule by mouth daily.        Marland Kitchen glimepiride (AMARYL) 4 MG tablet Take 4 mg by mouth daily before breakfast.        . insulin aspart (NOVOLOG) 100 UNIT/ML injection Inject 6 Units into the skin 3 (three) times daily with meals.  1 vial  3  . insulin glargine (LANTUS) 100 UNIT/ML injection Inject 25 Units into the skin at bedtime.  10 mL  3  . lisinopril (PRINIVIL,ZESTRIL) 10 MG tablet Take 10 mg by mouth daily.        . metFORMIN (GLUCOPHAGE) 500 MG tablet Take 500 mg by mouth 2 (two) times daily with a meal.      . simvastatin (ZOCOR) 20 MG tablet Take 20 mg by mouth at bedtime.           Physical Examination: Blood pressure 127/56, pulse 83, temperature 97.5 F (36.4 C), temperature source Oral, resp. rate 20, height 5\' 6"  (1.676 m), weight 95.255 kg (210 lb), SpO2 100.00%.  Neurologic Examination: Mental Status: Alert, oriented, thought content appropriate.  Speech fluent without evidence of aphasia. Able to follow commands without difficulty. Cranial Nerves: II-Visual fields were normal. III/IV/VI-Pupils were equal and reacted. Extraocular movements were full and conjugate.    V/VII-no facial numbness and no  facial weakness. VIII-normal. X-normal speech and symmetrical palatal movement. Motor: 5/5 bilaterally with normal tone and bulk Sensory: Normal except for reduced vibratory sensation distally in both lower extremities. Deep Tendon Reflexes: 1+ and symmetric in the upper extremities and absent in the lower extremities. Plantars: Flexor bilaterally Cerebellar: Normal finger-to-nose testing. Carotid auscultation: Normal   Dg Chest 2 View  12/15/2011  *RADIOLOGY REPORT*  Clinical Data: Dizziness  CHEST - 2 VIEW  Comparison: 11/26/2011  Findings: Normal heart size.  Under aerated and grossly clear lungs.  No pneumothorax.  No pleural effusions.  Stable thoracic spine.  IMPRESSION: No  active cardiopulmonary disease.  Original Report Authenticated By: Donavan Burnet, M.D.   Ct Head Wo Contrast  12/15/2011  *RADIOLOGY REPORT*  Clinical Data: Dizziness.  Nausea.  Headache.  Left arm numbness.  CT HEAD WITHOUT CONTRAST  Technique:  Contiguous axial images were obtained from the base of the skull through the vertex without contrast.  Comparison: 07/02/2011  Findings: The brain shows generalized atrophy.  No sign of old or acute focal infarction, mass lesion, hemorrhage, hydrocephalus or extra-axial collection.  The calvarium is unremarkable.  Sinuses, middle ears and mastoids are clear.  IMPRESSION: Negative head CT.  Mild generalized atrophy.  No focal or acute finding.  Original Report Authenticated By: Thomasenia Sales, M.D.    Assessment: 56 y.o. female presenting with possible recurrent TIA, likely subcortical right MCA territory.  Stroke Risk Factors - diabetes mellitus and hyperlipidemia  Plan: 1. HgbA1c, fasting lipid panel 2. MRI of the brain without contrast 3. Echocardiogram 4. Carotid dopplers 5. Prophylactic therapy-Antiplatelet med: Aspirin 81 mg per day and Aggrenox twice a day 6. Risk factor modification 7. Telemetry monitoring  C.R. Roseanne Reno, MD [facility] [phone]  12/15/2011, 8:50 PM

## 2011-12-15 NOTE — ED Notes (Signed)
Called [facility] for Neuro Hospitalist--said Dr. Alto Denver has spoken to doctor.  Gala Romney is paging [facility] for call.

## 2011-12-15 NOTE — ED Provider Notes (Signed)
History     CSN: [id number]  Arrival date & time 12/15/11  1157   First MD Initiated Contact with Patient 12/15/11 1212      Chief Complaint  Patient presents with  . Dizziness    (Consider location/radiation/quality/duration/timing/severity/associated sxs/prior treatment) HPI Patient is a-year-old female who presents today complaining of dizziness with spinning sensation that began while she was at work today. Patient was standing outside of brachialis and 15 when she noticed this. Patient denies being hot outside or standing suddenly. When she came inside she was transferred to our facility by EMS. Upon presentation patient does have a right-sided facial droop. She complains of decreased sensation over the left side of the face, left upper extremity, and left lower extremity. Patient initially denied any chest pain, shortness of breath, GI symptoms, GU symptoms, or recent cough or other respiratory symptoms. Patient has history of vertigo per the medical record the past the patient denies this being similar. Patient reports a history of a "light stroke in the past". MRI performed in February of 2013 did not reflect stroke. Patient has taken her Aggrenox today. Initial fingerstick was elevated. There are no other associated or modifying factors. Based on patient presentation and I stroke scale was 2 with one point for facial droop and 1 point for decreased sensation. Past Medical History  Diagnosis Date  . Coronary atherosclerosis of native coronary artery     non obstructive by cath in 2008 and 2010  . Unspecified essential hypertension   . Other and unspecified hyperlipidemia   . Syncope and collapse     near-syncopal episode in November/2008  . Unspecified cardiovascular disease     Decreased left-ventricular fxn  . 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 Unspecified hereditary and idiopathic peripheral neuropathy   . Obesity, unspecified     . Esophageal reflux   . History of diabetes mellitus, type II   . Diaphragmatic hernia without mention of obstruction or gangrene   . Irritable bowel syndrome   . Morbid obesity     Past Surgical History  Procedure Date  . Invasive electrophysiologic study 5/09    followed by insertion of an implantable loop recorder. S/p removal   . Colonoscopy   . Cholecystectomy   . Multiple toe surgeries   . Cardiac catheterization     LAD 30%, circumflex 50%, OM 75%, RI 60% with small branch 80%, dominant RCA 60%, EF 45-50%    Family History  Problem Relation Age of Onset  . Pneumonia Mother   . Gallbladder disease Mother     cancer  . Heart failure Mother   . Diabetes Father   . Coronary artery disease Father   . Stroke Neg Hx     History  Substance Use Topics  . Smoking status: Current Everyday Smoker -- 1.0 packs/day for 30 years    Types: Cigarettes  . Smokeless tobacco: Not on file  . Alcohol Use: Yes    OB History    Grav Para Term Preterm Abortions TAB SAB Ect Mult Living                  Review of Systems  Constitutional: Negative.   Eyes: Negative.   Respiratory: Negative.   Cardiovascular: Negative.   Gastrointestinal: Negative.   Genitourinary: Negative.   Musculoskeletal: Negative.   Skin: Negative.   Neurological: Positive for dizziness, facial asymmetry, light-headedness, numbness and headaches.  Hematological: Negative.   Psychiatric/Behavioral:  Negative.   All other systems reviewed and are negative.    Allergies  Banana; Penicillins; and Strawberry  Home Medications   Current Outpatient Rx  Name Route Sig Dispense Refill  . ASPIRIN 81 MG PO CHEW Oral Chew 325 mg by mouth once.     . ASPIRIN-DIPYRIDAMOLE ER 25-200 MG PO CP12 Oral Take 1 capsule by mouth daily.      Marland Kitchen GLIMEPIRIDE 4 MG PO TABS Oral Take 4 mg by mouth daily before breakfast.      . INSULIN ASPART 100 UNIT/ML [place] SOLN Subcutaneous Inject 6 Units into the skin 3 (three) times daily  with meals. 1 vial 3    Please also provide insulin syringes # 120 with PR ...  . INSULIN GLARGINE 100 UNIT/ML [place] SOLN Subcutaneous Inject 25 Units into the skin at bedtime. 10 mL 3  . LISINOPRIL 10 MG PO TABS Oral Take 10 mg by mouth daily.      Marland Kitchen METFORMIN HCL 500 MG PO TABS Oral Take 500 mg by mouth 2 (two) times daily with a meal.    . SIMVASTATIN 20 MG PO TABS Oral Take 20 mg by mouth at bedtime.        BP 145/96  Pulse 88  Temp 98.7 F (37.1 C) (Oral)  Resp 20  Ht 5\' 6"  (1.676 m)  Wt 210 lb (95.255 kg)  BMI 33.89 kg/m2  SpO2 100%  Physical Exam  Nursing note and vitals reviewed. GEN: Well-developed, well-nourished female in mo distress HEENT: Atraumatic, normocephalic. Oropharynx clear without erythema EYES: PERRLA BL, no scleral icterus. NECK: Trachea midline, no meningismus CV: regular rate and rhythm. No murmurs, rubs, or gallops PULM: No respiratory distress.  No crackles, wheezes, or rales. GI: soft, non-tender. No guarding, rebound, or tenderness. + bowel sounds  GU: deferred Neuro: Patient with NIH stroke scale of 2 ( right-sided facial droop, left-sided facial numbness, left upper and lower extremity numbness (of note numbness is slight and not dense)), alert and oriented x3, right-sided facial droop noted, otherwise cranial nerves II through XII intact, no visual deficits or daily changes, patient moves all fortunately symmetrically with no limb drift, no ataxia on finger to nose task bilaterally, no ataxia on heel-to-shin task bilaterally, no dysarthria, patient with mild nystagmus to the right noted on previous exams on extraocular movement testing. Patient did fail liquid portion of swallow screen. MSK: Patient moves all 4 extremities symmetrically, no deformity, edema, or injury noted Skin: No rashes petechiae, purpura, or jaundice Psych: no abnormality of mood   ED Course  Procedures (including critical care time)  Indication: numbness Please note this EKG  was reviewed extemporaneously by myself.  Date: 12/15/2011   Date: 12/15/2011  Rate: 93  Rhythm: normal sinus rhythm  QRS Axis: normal  Intervals: normal  ST/T Wave abnormalities: nonspecific T wave changes  Conduction Disutrbances:none  Narrative Interpretation: Lateral T-wave inversions compared to prior EKG. No acute ischemic changes.  Old EKG Reviewed: changes noted      Labs Reviewed  COMPREHENSIVE METABOLIC PANEL - Abnormal; Notable for the following:    Glucose, Bld 282 (*)     Alkaline Phosphatase 186 (*)     GFR calc non Af Amer 80 (*)     All other components within normal limits  GLUCOSE, CAPILLARY - Abnormal; Notable for the following:    Glucose-Capillary 248 (*)     All other components within normal limits  GLUCOSE, CAPILLARY - Abnormal; Notable for the following:  Glucose-Capillary 240 (*)     All other components within normal limits  PROTIME-INR  APTT  CBC  DIFFERENTIAL  CK TOTAL AND CKMB  TROPONIN I  URINE RAPID DRUG SCREEN (HOSP PERFORMED)  URINALYSIS, ROUTINE W REFLEX MICROSCOPIC   Dg Chest 2 View  12/15/2011  *RADIOLOGY REPORT*  Clinical Data: Dizziness  CHEST - 2 VIEW  Comparison: 11/26/2011  Findings: Normal heart size.  Under aerated and grossly clear lungs.  No pneumothorax.  No pleural effusions.  Stable thoracic spine.  IMPRESSION: No active cardiopulmonary disease.  Original Report Authenticated By: Donavan Burnet, M.D.   Ct Head Wo Contrast  12/15/2011  *RADIOLOGY REPORT*  Clinical Data: Dizziness.  Nausea.  Headache.  Left arm numbness.  CT HEAD WITHOUT CONTRAST  Technique:  Contiguous axial images were obtained from the base of the skull through the vertex without contrast.  Comparison: 07/02/2011  Findings: The brain shows generalized atrophy.  No sign of old or acute focal infarction, mass lesion, hemorrhage, hydrocephalus or extra-axial collection.  The calvarium is unremarkable.  Sinuses, middle ears and mastoids are clear.  IMPRESSION:  Negative head CT.  Mild generalized atrophy.  No focal or acute finding.  Original Report Authenticated By: Thomasenia Sales, M.D.     1. Dizziness   2. Numbness   3. Facial droop   4. Mild dehydration   5. Hyperglycemia       MDM  Patient was evaluated by myself. Based on evaluation patient workup for possible stroke. Patient had NIH stroke scale of only 2. CT of the head was negative. Patient was not hypoglycemic. She did have a fingerstick of 248. While hypoglycemic this is not significantly different from patient's prior values upon presentation to the ED. She denies any recent infectious symptoms. Orders were placed for urinalysis and chest x-ray. Chest x-ray was within normal limits. EKG showed only nonspecific T wave changes. Patient noticed that this is her anemia. Patient is given a liter of IV fluids she had a slight elevated BUN to creatinine ratio as well as hyperglycemia. Repeat showed glucose of 240. An order was placed for 10 units of subcutaneous regular insulin. Patient was discussed with Dr. Roseanne Reno who was on call for neurology. He agreed that patient did not qualify as a code stroke given her very low stroke scale score. He agreed to see the patient in consultation upon arrival to [facility]. Patient was discussed with hospitalist. She was accepted for admission. Given patient's persistent neurologic symptoms she did not qualify for CDU admission. Of note patient had a brief 10 minute period where she complained of chest pain. This had resolved prior to my discussion with hospitalist. Patient remained hemodynamically stable throughout. Patient had had a negative troponin. She also while here complained of headache. She was given migraine cocktail with 10 mg of Reglan IV as well as 12 have milligrams of Benadryl IV. Patient does exhibit remission and will be transferred to [facility] for further management. No MRI was available at our location today and therefore this was unable to be  performed.        Cyndra Numbers, MD 12/15/11 [phone]

## 2011-12-16 DIAGNOSIS — E785 Hyperlipidemia, unspecified: Secondary | ICD-10-CM

## 2011-12-16 DIAGNOSIS — I517 Cardiomegaly: Secondary | ICD-10-CM

## 2011-12-16 DIAGNOSIS — B3749 Other urogenital candidiasis: Secondary | ICD-10-CM | POA: Diagnosis present

## 2011-12-16 LAB — GLUCOSE, CAPILLARY
Glucose-Capillary: 79 mg/dL (ref 70–99)
Glucose-Capillary: 116 mg/dL — ABNORMAL HIGH (ref 70–99)
Glucose-Capillary: 86 mg/dL (ref 70–99)
Glucose-Capillary: 271 mg/dL — ABNORMAL HIGH (ref 70–99)

## 2011-12-16 LAB — LIPID PANEL
Total CHOL/HDL Ratio: 5.3 RATIO
VLDL: 36 mg/dL (ref 0–40)

## 2011-12-16 LAB — HEMOGLOBIN A1C: Mean Plasma Glucose: 220 mg/dL — ABNORMAL HIGH (ref <117)

## 2011-12-16 MED ORDER — FLUCONAZOLE 100 MG PO TABS
100.0000 mg | ORAL_TABLET | Freq: Every day | ORAL | Status: DC
  Administered 2011-12-17: 100 mg via ORAL
  Filled 2011-12-16: qty 1

## 2011-12-16 MED ORDER — FLUCONAZOLE 150 MG PO TABS
150.0000 mg | ORAL_TABLET | Freq: Once | ORAL | Status: AC
  Administered 2011-12-16: 150 mg via ORAL
  Filled 2011-12-16: qty 1

## 2011-12-16 MED ORDER — LIVING WELL WITH DIABETES BOOK
Freq: Once | Status: AC
  Administered 2011-12-16: 15:00:00
  Filled 2011-12-16: qty 1

## 2011-12-16 MED ORDER — SIMVASTATIN 40 MG PO TABS
40.0000 mg | ORAL_TABLET | Freq: Every day | ORAL | Status: DC
  Administered 2011-12-16: 40 mg via ORAL
  Filled 2011-12-16 (×2): qty 1

## 2011-12-16 NOTE — Evaluation (Signed)
Clinical/Bedside Swallow Evaluation Patient Details  Name: [name] MRN: [id number] Date of Birth: 1955/06/15  Today's Date: 12/16/2011 Time: 4166-0630 SLP Time Calculation (min): 18 min  Past Medical History:  Past Medical History  Diagnosis Date  . Coronary atherosclerosis of native coronary artery     non obstructive by cath in 2008 and 2010  . Unspecified essential hypertension   . Other and unspecified hyperlipidemia   . Syncope and collapse     near-syncopal episode in November/2008  . Unspecified cardiovascular disease     Decreased left-ventricular fxn  . 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 Unspecified hereditary and idiopathic peripheral neuropathy   . Obesity, unspecified   . Esophageal reflux   . History of diabetes mellitus, type II   . Diaphragmatic hernia without mention of obstruction or gangrene   . Irritable bowel syndrome   . Morbid obesity    Past Surgical History:  Past Surgical History  Procedure Date  . Invasive electrophysiologic study 5/09    followed by insertion of an implantable loop recorder. S/p removal   . Colonoscopy   . Cholecystectomy   . Multiple toe surgeries   . Cardiac catheterization     LAD 30%, circumflex 50%, OM 75%, RI 60% with small branch 80%, dominant RCA 60%, EF 45-50%   HPI:  56 y.o. female admitted with acute onset of left sided weakness and numbness, with symptoms resolving in the ED.  MRI showed an acute right white matter infarct.  Patient did not pass the RN stroke swallow screen in the ED due to wet vocal quality post-swallow.   Assessment / Plan / Recommendation Clinical Impression  Demonstrates a functional oral-pharyngeal swallow without any overt clinical indicators of a dysphagia nor aspiration risk.    Aspiration Risk  None    Diet Recommendation Regular;Thin liquid   Liquid Administration via: Cup;Straw Medication Administration: Whole meds with  liquid Supervision: Patient able to self feed    Follow Up Recommendations  None    Pertinent Vitals/Pain n/a    Swallow Study Oral/Motor/Sensory Function Overall Oral Motor/Sensory Function: Appears within functional limits for tasks assessed   Ice Chips Ice chips: Not tested   Thin Liquid Thin Liquid: Within functional limits Presentation: Cup;Self Fed;Straw    Nectar Thick Nectar Thick Liquid: Not tested   Honey Thick Honey Thick Liquid: Not tested   Puree Puree: Within functional limits Presentation: Self Fed;Spoon   Solid Solid: Within functional limits Presentation: Self Fed    Myra Rude, M.S.,CCC-SLP Pager [phone] 12/16/2011,9:50 AM

## 2011-12-16 NOTE — Progress Notes (Signed)
Utilization review complete 

## 2011-12-16 NOTE — Progress Notes (Signed)
  Echocardiogram 2D Echocardiogram has been performed.  Georgian Co 12/16/2011, 1:48 PM

## 2011-12-16 NOTE — Progress Notes (Signed)
VASCULAR LAB PRELIMINARY  PRELIMINARY  PRELIMINARY  PRELIMINARY  Carotid duplex  completed.    Preliminary report:  Bilateral:  No evidence of hemodynamically significant internal carotid artery stenosis.   Vertebral artery flow is antegrade.      Keena Dinse, RVT 12/16/2011, 12:02 PM

## 2011-12-16 NOTE — H&P (Addendum)
[name] is an 56 y.o. female.   Chief Complaint: Dizziness and facial droop HPI: A 56 year old female well known to our service with recurrent syncopal episodes and TIAs who also has a history of CVA in the past. Patient also has history of coronary artery disease at some point had loop recorder in place for a while. She is here today because of another episode of dizziness syncopal episode and facial droop at work. Symptoms occurred suddenly. She was slightly dehydrated from the heat but for the most part was doing housework without a problem and he can't. He started with the dizziness followed by slurred speech and the patient passed out momentarily. EMS protocol and [facility] was [facility]. Initial evaluation was uneventful but she's been admitted for possible repeat TIAs. She is stable now except for headache.  Past Medical History  Diagnosis Date  . Coronary atherosclerosis of native coronary artery     non obstructive by cath in 2008 and 2010  . Unspecified essential hypertension   . Other and unspecified hyperlipidemia   . Syncope and collapse     near-syncopal episode in November/2008  . Unspecified cardiovascular disease     Decreased left-ventricular fxn  . 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 Unspecified hereditary and idiopathic peripheral neuropathy   . Obesity, unspecified   . Esophageal reflux   . History of diabetes mellitus, type II   . Diaphragmatic hernia without mention of obstruction or gangrene   . Irritable bowel syndrome   . Morbid obesity     Past Surgical History  Procedure Date  . Invasive electrophysiologic study 5/09    followed by insertion of an implantable loop recorder. S/p removal   . Colonoscopy   . Cholecystectomy   . Multiple toe surgeries   . Cardiac catheterization     LAD 30%, circumflex 50%, OM 75%, RI 60% with small branch 80%, dominant RCA 60%, EF 45-50%    Family History   Problem Relation Age of Onset  . Pneumonia Mother   . Gallbladder disease Mother     cancer  . Heart failure Mother   . Diabetes Father   . Coronary artery disease Father   . Stroke Neg Hx    Social History:  reports that she has been smoking Cigarettes.  She has a 15 pack-year smoking history. She does not have any smokeless tobacco history on file. She reports that she drinks about 1.2 ounces of alcohol per week. She reports that she does not use illicit drugs.  Allergies:  Allergies  Allergen Reactions  . Banana     Face puffs up  . Penicillins     REACTION: itching, edema  . Strawberry     Face puffs up    Medications Prior to Admission  Medication Sig Dispense Refill  . aspirin 81 MG chewable tablet Chew 325 mg by mouth once.       . dipyridamole-aspirin (AGGRENOX) 25-200 MG per 12 hr capsule Take 1 capsule by mouth daily.        Marland Kitchen glimepiride (AMARYL) 4 MG tablet Take 4 mg by mouth daily before breakfast.        . insulin aspart (NOVOLOG) 100 UNIT/ML injection Inject 6 Units into the skin 3 (three) times daily with meals.  1 vial  3  . insulin glargine (LANTUS) 100 UNIT/ML injection Inject 25 Units into the skin at bedtime.  10 mL  3  . lisinopril (PRINIVIL,ZESTRIL) 10 MG tablet Take 10 mg by mouth daily.        . metFORMIN (GLUCOPHAGE) 500 MG tablet Take 500 mg by mouth 2 (two) times daily with a meal.      . simvastatin (ZOCOR) 20 MG tablet Take 20 mg by mouth at bedtime.          Results for orders placed during the hospital encounter of 12/15/11 (from the past 48 hour(s))  PROTIME-INR     Status: Normal   Collection Time   12/15/11 12:22 PM      Component Value Range Comment   Prothrombin Time 12.8  11.6 - 15.2 seconds    INR 0.94  0.00 - 1.49   APTT     Status: Normal   Collection Time   12/15/11 12:22 PM      Component Value Range Comment   aPTT 34  24 - 37 seconds   CBC     Status: Normal   Collection Time   12/15/11 12:22 PM      Component Value Range  Comment   WBC 10.1  4.0 - 10.5 K/uL    RBC 4.88  3.87 - 5.11 MIL/uL    Hemoglobin 13.6  12.0 - 15.0 g/dL    HCT 40.9  81.1 - 91.4 %    MCV 84.0  78.0 - 100.0 fL    MCH 27.9  26.0 - 34.0 pg    MCHC 33.2  30.0 - 36.0 g/dL    RDW 78.2  95.6 - 21.3 %    Platelets 249  150 - 400 K/uL   DIFFERENTIAL     Status: Normal   Collection Time   12/15/11 12:22 PM      Component Value Range Comment   Neutrophils Relative 77  43 - 77 %    Neutro Abs 7.7  1.7 - 7.7 K/uL    Lymphocytes Relative 19  12 - 46 %    Lymphs Abs 1.9  0.7 - 4.0 K/uL    Monocytes Relative 3  3 - 12 %    Monocytes Absolute 0.3  0.1 - 1.0 K/uL    Eosinophils Relative 0  0 - 5 %    Eosinophils Absolute 0.0  0.0 - 0.7 K/uL    Basophils Relative 0  0 - 1 %    Basophils Absolute 0.0  0.0 - 0.1 K/uL   COMPREHENSIVE METABOLIC PANEL     Status: Abnormal   Collection Time   12/15/11 12:22 PM      Component Value Range Comment   Sodium 137  135 - 145 mEq/L    Potassium 4.4  3.5 - 5.1 mEq/L    Chloride 97  96 - 112 mEq/L    CO2 28  19 - 32 mEq/L    Glucose, Bld 282 (*) 70 - 99 mg/dL    BUN 14  6 - 23 mg/dL    Creatinine, Ser 0.86  0.50 - 1.10 mg/dL    Calcium 9.6  8.4 - 57.8 mg/dL    Total Protein 7.9  6.0 - 8.3 g/dL    Albumin 3.7  3.5 - 5.2 g/dL    AST 17  0 - 37 U/L    ALT 23  0 - 35 U/L    Alkaline Phosphatase 186 (*) 39 - 117 U/L    Total Bilirubin 0.4  0.3 - 1.2 mg/dL    GFR calc non Af Amer 80 (*) >90 mL/min  GFR calc Af Amer >90  >90 mL/min   CK TOTAL AND CKMB     Status: Normal   Collection Time   12/15/11 12:22 PM      Component Value Range Comment   Total CK 49  7 - 177 U/L    CK, MB 1.8  0.3 - 4.0 ng/mL    Relative Index RELATIVE INDEX IS INVALID  0.0 - 2.5   TROPONIN I     Status: Normal   Collection Time   12/15/11 12:22 PM      Component Value Range Comment   Troponin I <0.30  <0.30 ng/mL   GLUCOSE, CAPILLARY     Status: Abnormal   Collection Time   12/15/11 12:38 PM      Component Value Range Comment    Glucose-Capillary 248 (*) 70 - 99 mg/dL   GLUCOSE, CAPILLARY     Status: Abnormal   Collection Time   12/15/11  2:01 PM      Component Value Range Comment   Glucose-Capillary 240 (*) 70 - 99 mg/dL   URINE RAPID DRUG SCREEN (HOSP PERFORMED)     Status: Normal   Collection Time   12/15/11  4:19 PM      Component Value Range Comment   Opiates NONE DETECTED  NONE DETECTED    Cocaine NONE DETECTED  NONE DETECTED    Benzodiazepines NONE DETECTED  NONE DETECTED    Amphetamines NONE DETECTED  NONE DETECTED    Tetrahydrocannabinol NONE DETECTED  NONE DETECTED    Barbiturates NONE DETECTED  NONE DETECTED   URINALYSIS, ROUTINE W REFLEX MICROSCOPIC     Status: Abnormal   Collection Time   12/15/11  4:19 PM      Component Value Range Comment   Color, Urine YELLOW  YELLOW    APPearance CLOUDY (*) CLEAR    Specific Gravity, Urine 1.022  1.005 - 1.030    pH 5.5  5.0 - 8.0    Glucose, UA NEGATIVE  NEGATIVE mg/dL    Hgb urine dipstick NEGATIVE  NEGATIVE    Bilirubin Urine SMALL (*) NEGATIVE    Ketones, ur 15 (*) NEGATIVE mg/dL    Protein, ur NEGATIVE  NEGATIVE mg/dL    Urobilinogen, UA 1.0  0.0 - 1.0 mg/dL    Nitrite NEGATIVE  NEGATIVE    Leukocytes, UA MODERATE (*) NEGATIVE   URINE MICROSCOPIC-ADD ON     Status: Abnormal   Collection Time   12/15/11  4:19 PM      Component Value Range Comment   Squamous Epithelial / LPF FEW (*) RARE    WBC, UA 7-10  <3 WBC/hpf    Bacteria, UA MANY (*) RARE    Urine-Other MANY YEAST     GLUCOSE, CAPILLARY     Status: Normal   Collection Time   12/15/11  6:22 PM      Component Value Range Comment   Glucose-Capillary 72  70 - 99 mg/dL   CBC     Status: Normal   Collection Time   12/15/11  8:59 PM      Component Value Range Comment   WBC 10.2  4.0 - 10.5 K/uL    RBC 4.33  3.87 - 5.11 MIL/uL    Hemoglobin 12.3  12.0 - 15.0 g/dL    HCT 13.0  86.5 - 78.4 %    MCV 86.8  78.0 - 100.0 fL    MCH 28.4  26.0 - 34.0 pg    MCHC 32.7  30.0 - 36.0 g/dL    RDW 40.9   81.1 - 91.4 %    Platelets 203  150 - 400 K/uL   CREATININE, SERUM     Status: Normal   Collection Time   12/15/11  8:59 PM      Component Value Range Comment   Creatinine, Ser 0.77  0.50 - 1.10 mg/dL    GFR calc non Af Amer >90  >90 mL/min    GFR calc Af Amer >90  >90 mL/min   GLUCOSE, CAPILLARY     Status: Normal   Collection Time   12/15/11  9:19 PM      Component Value Range Comment   Glucose-Capillary 79  70 - 99 mg/dL    Comment 1 Documented in Chart      Comment 2 Notify RN     GLUCOSE, CAPILLARY     Status: Abnormal   Collection Time   12/16/11  1:13 AM      Component Value Range Comment   Glucose-Capillary 116 (*) 70 - 99 mg/dL    Comment 1 Documented in Chart      Comment 2 Notify RN      Dg Chest 2 View  12/15/2011  *RADIOLOGY REPORT*  Clinical Data: Dizziness  CHEST - 2 VIEW  Comparison: 11/26/2011  Findings: Normal heart size.  Under aerated and grossly clear lungs.  No pneumothorax.  No pleural effusions.  Stable thoracic spine.  IMPRESSION: No active cardiopulmonary disease.  Original Report Authenticated By: Donavan Burnet, M.D.   Ct Head Wo Contrast  12/15/2011  *RADIOLOGY REPORT*  Clinical Data: Dizziness.  Nausea.  Headache.  Left arm numbness.  CT HEAD WITHOUT CONTRAST  Technique:  Contiguous axial images were obtained from the base of the skull through the vertex without contrast.  Comparison: 07/02/2011  Findings: The brain shows generalized atrophy.  No sign of old or acute focal infarction, mass lesion, hemorrhage, hydrocephalus or extra-axial collection.  The calvarium is unremarkable.  Sinuses, middle ears and mastoids are clear.  IMPRESSION: Negative head CT.  Mild generalized atrophy.  No focal or acute finding.  Original Report Authenticated By: Thomasenia Sales, M.D.    Review of Systems  Constitutional: Positive for diaphoresis.  Eyes: Negative.   Respiratory: Negative.   Cardiovascular: Negative.   Gastrointestinal: Negative.   Genitourinary: Negative.    Musculoskeletal: Negative.   Skin: Negative.   Neurological: Positive for dizziness, loss of consciousness, weakness and headaches.  Endo/Heme/Allergies: Negative.   Psychiatric/Behavioral: Negative.     Blood pressure 124/60, pulse 80, temperature 97.3 F (36.3 C), temperature source Oral, resp. rate 20, height 5\' 6"  (1.676 m), weight 103.2 kg (227 lb 8.2 oz), SpO2 100.00%. Physical Exam  Constitutional: She is oriented to person, place, and time. She appears well-developed and well-nourished.  HENT:  Head: Normocephalic and atraumatic.  Right Ear: External ear normal.  Left Ear: External ear normal.  Nose: Nose normal.  Mouth/Throat: Oropharynx is clear and moist.  Eyes: Conjunctivae and EOM are normal. Pupils are equal, round, and reactive to light.  Neck: Normal range of motion. Neck supple.  Cardiovascular: Normal rate, regular rhythm, normal heart sounds and intact distal pulses.   Respiratory: Effort normal and breath sounds normal.  GI: Soft. Bowel sounds are normal.  Musculoskeletal: Normal range of motion.  Neurological: She is alert and oriented to person, place, and time. She has normal reflexes.  Skin: Skin is warm and dry.  Psychiatric: She has a normal mood  and affect. Her behavior is normal. Judgment and thought content normal.     Assessment/Plan 56 year old female with possible TIA versus syncope. Patient reported failed swallow evaluation at that time she had slurred speech. She is also diabetic with morbid obesity and coronary artery disease.  Plan #1 TIA: Patient will be admitted for full TIA workup. The change in her speech as well as facial droop has since disappeared. We will also keep her on the aggrenox once he passes a swallow evaluation. MRI will be checked for possible new stroke. Carotid Dopplers echocardiogram will also be rechecked.  #2 diabetes: Her blood sugar seems okay there is no evidence of hypoglycemia as a cause of her symptoms. Continue  with home medications as well as sliding scale insulin.  #3 Hypertension: Continue his home medications. Blood pressure seems reasonable.  #4 coronary artery disease: Stable but will put on telemetry and check serial cardiac enzymes.  #5 dehydration: Gentle hydration will be given.  #6 DVT prophylaxis: She will be on Lovenox. Also PPIs for GI prophylaxis and GERD.  GARBA,LAWAL 12/16/2011, 2:38 AM

## 2011-12-16 NOTE — Evaluation (Signed)
Physical Therapy Evaluation Patient Details Name: [name] MRN: [id number] DOB: 09-26-1955 Today's Date: 12/16/2011 Time: 6213-0865 PT Time Calculation (min): 25 min  PT Assessment / Plan / Recommendation Clinical Impression  [name] is 56 y.o. female admitted with acute onset of left sided weakness and numbness. Per H&P symptoms resolved in the ED.  MRI showed an acute right white matter infarct. Presents to physical therapy today with higher level balance deficits. Will benefit physical therapy in the acute setting to address balance and maximize functional independence and safety for d/c home. Rec outpatient PT f/u for continued balance training.     PT Assessment  Patient needs continued PT services    Follow Up Recommendations  Outpatient PT    Barriers to Discharge        Equipment Recommendations  None recommended by PT    Recommendations for Other Services     Frequency Min 4X/week    Precautions / Restrictions Precautions Precautions: Fall Restrictions Weight Bearing Restrictions: No         Mobility  Bed Mobility Bed Mobility: Rolling Right;Rolling Left;Left Sidelying to Sit Rolling Right: 7: Independent Rolling Left: 7: Independent Left Sidelying to Sit: 6: Modified independent (Device/Increase time) Transfers Transfers: Sit to Stand;Stand to Sit Sit to Stand: 5: Supervision;From bed Stand to Sit: 6: Modified independent (Device/Increase time);To chair/3-in-1;With upper extremity assist;With armrests Details for Transfer Assistance: supervision for safety as pt slightly unsteady Ambulation/Gait Ambulation/Gait Assistance: 5: Supervision;4: Min guard Ambulation Distance (Feet): 250 Feet Assistive device: None Ambulation/Gait Assistance Details: mingaurdA-minA for higher challenging activities especially with head turns pt tending to stagger, slower gait speed with wide BOS and decreased step length; otherwise supervision/mingaurdA because of slower  gait speed and more cautious gait; Gait Pattern: Decreased stride length;Wide base of support Stairs: Yes Stairs Assistance: 5: Supervision Stairs Assistance Details (indicate cue type and reason): supervision for safety, pt taking the steps slowly and cautiously Stair Management Technique: One rail Right Number of Stairs: 2  Modified Rankin (Stroke Patients Only) Pre-Morbid Rankin Score: No significant disability Modified Rankin: No significant disability    Exercises     PT Diagnosis: Abnormality of gait  PT Problem List: Decreased activity tolerance;Decreased balance PT Treatment Interventions: Gait training;Functional mobility training;Therapeutic activities;Therapeutic exercise;Balance training;Neuromuscular re-education;Patient/family education   PT Goals Acute Rehab PT Goals PT Goal Formulation: With patient Time For Goal Achievement: 12/23/11 Potential to Achieve Goals: Good Pt will go Sit to Stand: Independently PT Goal: Sit to Stand - Progress: Goal set today Pt will Transfer Bed to Chair/Chair to Bed: Independently PT Transfer Goal: Bed to Chair/Chair to Bed - Progress: Goal set today Pt will Ambulate: >150 feet;Independently;with gait velocity >(comment) ft/second (gait speed >/=1.8 ft/sec) PT Goal: Ambulate - Progress: Goal set today Pt will Perform Home Exercise Program: Independently PT Goal: Perform Home Exercise Program - Progress: Goal set today Additional Goals Additional Goal #1: Pt will perform DGI with score >/=20/24 to demonstrate decreased risk of falls.  PT Goal: Additional Goal #1 - Progress: Goal set today  Visit Information  Last PT Received On: 12/16/11 Assistance Needed: +1    Subjective Data  Subjective: I feel a lot better now. 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    Cognition  Overall Cognitive Status: Appears within functional limits for tasks assessed/performed Arousal/Alertness: Awake/alert Orientation Level: Appears intact for tasks assessed Behavior During Session: [facility] for tasks performed    Extremity/Trunk Assessment Right Lower Extremity Assessment RLE ROM/Strength/Tone: Within functional levels RLE Sensation: WFL - Light Touch;WFL - Proprioception RLE Coordination: WFL - gross/fine motor Left Lower Extremity Assessment LLE ROM/Strength/Tone: Within functional levels LLE Sensation: WFL - Light Touch;WFL - Proprioception LLE Coordination: WFL - gross/fine motor Trunk Assessment Trunk Assessment: Normal   Balance Standardized Balance Assessment Standardized Balance Assessment: Dynamic Gait Index Dynamic Gait Index Level Surface: Mild Impairment Change in Gait Speed: Mild Impairment Gait with Horizontal Head Turns: Mild Impairment Gait with Vertical Head Turns: Mild Impairment Gait and Pivot Turn: Mild Impairment Step Over Obstacle: Normal Step Around Obstacles: Mild Impairment Steps: Mild Impairment Total Score: 17   End of Session PT - End of Session Equipment Utilized During Treatment: Gait belt Activity Tolerance: Patient tolerated treatment well Patient left: in chair;with call bell/phone within reach Nurse Communication: Mobility status  GP     [facility] [name] 12/16/2011, 3:11 PM

## 2011-12-16 NOTE — Progress Notes (Signed)
[facility] PROGRESS NOTE  [name] TRIMMER ZOX:[id number] DOB: 1956-04-18 DOA: 12/15/2011 PCP: [name], MD  Assessment/Plan: Principal Problem:  *CVA (cerebral infarction) Active Problems:  HYPERLIPIDEMIA  HYPERTENSION  DM type 2, uncontrolled, with neuropathy  Dizziness  Facial droop  Hyperglycemia  Mild dehydration  Numbness  TIA (transient ischemic attack)  Candiduria  1. Acute CVA: Patient presented with complaints of dizziness. MRI of the brain showed an acute frontal lobe CVA. The patient has minimal residual weakness as a result of a CVA. However her workup is still incomplete awaiting the results of his 2-D echocardiogram. Carotid duplex shows no evidence of hemodynamically significant carotid artery stenosis. She's been evaluated by both physical therapy and occupational therapy both of whom recommended outpatient therapy for balance training. I will make recommendations from the stroke team as to her antiplatelet therapy as she's currently on Aggrenox and aspirin 325 mg by mouth daily. 2. Hyperlipidemia: Patient has a known history of hyperlipidemia however evaluation of her cholesterol shows an LDL of 127. She's been on simvastatin 20 mg for more than a year. I'm increasing her simvastatin both for independent stroke risk reduction as well as treatment of her hyperlipidemia. 3. Hypertension: The patient's blood pressure is moderately well controlled although not at goal. I will defer to her primary care physician as an outpatient to further titrate her medications. 4. Diabetes mellitus type 2: Currently the patient's blood sugars are controlled however her hemoglobin A1c of 9.5 reflects poorly controlled diabetes of chronic basis. I will defer to her primary care physician as an outpatient to further titrate her medications. 5. Candiduria: The patient was noted to have yeast in her urine. In the setting of poorly controlled diabetes. No treat this with Diflucan.  Code  Status: Full code Family Communication: None. Patient is fully awake and alert unable to communicate her medical information to family members.  Disposition Plan: Expected disposition is home at time of discharge.  Greig Altergott A.  [facility] Pager [phone]. If 8PM-8AM, please contact night-coverage at [email or web address], password [facility] 12/16/2011, 4:54 PM  LOS: 1 day   Brief narrative: 56 year old female with a history of recurrent syncopal episodes and TIAs who also has a history of CVA in the past. Patient also has history of coronary artery disease at some point had loop recorder in place for a while. She is here today because of another episode of dizziness syncopal episode and facial droop at work. Symptoms occurred suddenly. She was slightly dehydrated from the heat but for the most part was doing housework without a problem and he can't. He started with the dizziness followed by slurred speech and the patient passed out momentarily. EMS protocol and [facility]. Initial evaluation was uneventful but she's been admitted for possible repeat TIAs. She is stable now except for headache.   Consultants:  Stroke team  Procedures:  Antibiotics:  Diflucan 12/16/2011 >  HPI/Subjective: Patient states that she feels well. She has no further dizziness at this time.  Objective: Filed Vitals:   12/16/11 0300 12/16/11 0600 12/16/11 0951 12/16/11 1459  BP: 133/57 169/74 159/87 135/62  Pulse: 79 80 80 88  Temp:  98 F (36.7 C) 97.7 F (36.5 C) 97.7 F (36.5 C)  TempSrc:  Oral Oral Oral  Resp:  20 20 20   Height:      Weight:      SpO2:  100% 100% 100%   Weight change:  No intake or output data in the  24 hours ending 12/16/11 1654  General: Alert, awake, oriented x3, in no acute distress.  HEENT: [place]/AT PEERL, EOMI Neck: no carotid bruit OROPHARYNX:  Moist, No exudate/ erythema/lesions.  Heart: Regular rate and rhythm, without murmurs, rubs,  gallops. Lungs: Clear to auscultation  Abdomen: Soft, obese, nontender, nondistended, positive bowel sounds. Neuro: No focal neurological deficits noted cranial nerves II through XII grossly intact. DTRs 2+ bilaterally upper and lower extremities. Strength functional in bilateral upper and lower extremities.   Data Reviewed: Basic Metabolic Panel:  Lab 12/15/11 1610 12/15/11 1222  NA -- 137  K -- 4.4  CL -- 97  CO2 -- 28  GLUCOSE -- 282*  BUN -- 14  CREATININE 0.77 0.80  CALCIUM -- 9.6  MG -- --  PHOS -- --   Liver Function Tests:  Lab 12/15/11 1222  AST 17  ALT 23  ALKPHOS 186*  BILITOT 0.4  PROT 7.9  ALBUMIN 3.7   No results found for this basename: LIPASE:5,AMYLASE:5 in the last 168 hours No results found for this basename: AMMONIA:5 in the last 168 hours CBC:  Lab 12/15/11 2059 12/15/11 1222  WBC 10.2 10.1  NEUTROABS -- 7.7  HGB 12.3 13.6  HCT 37.6 41.0  MCV 86.8 84.0  PLT 203 249   Cardiac Enzymes:  Lab 12/15/11 1222  CKTOTAL 49  CKMB 1.8  CKMBINDEX --  TROPONINI <0.30   BNP (last 3 results) No results found for this basename: PROBNP:3 in the last 8760 hours CBG:  Lab 12/16/11 1638 12/16/11 1133 12/16/11 0656 12/16/11 0113 12/15/11 2119  GLUCAP 106* 179* 86 116* 79    No results found for this or any previous visit (from the past 240 hour(s)).   Studies: Dg Chest 2 View  12/15/2011  *RADIOLOGY REPORT*  Clinical Data: Dizziness  CHEST - 2 VIEW  Comparison: 11/26/2011  Findings: Normal heart size.  Under aerated and grossly clear lungs.  No pneumothorax.  No pleural effusions.  Stable thoracic spine.  IMPRESSION: No active cardiopulmonary disease.  Original Report Authenticated By: Donavan Burnet, M.D.   Dg Chest 2 View  11/26/2011  *RADIOLOGY REPORT*  Clinical Data: Shortness of breath and weakness.  CHEST - 2 VIEW  Comparison: PA and lateral chest 05/29/2011.  Findings: Lungs are clear.  Heart size is normal.  No pneumothorax or pleural fluid.   IMPRESSION: Negative chest.  Original Report Authenticated By: Bernadene Bell. Maricela Curet, M.D.   Ct Head Wo Contrast  12/15/2011  *RADIOLOGY REPORT*  Clinical Data: Dizziness.  Nausea.  Headache.  Left arm numbness.  CT HEAD WITHOUT CONTRAST  Technique:  Contiguous axial images were obtained from the base of the skull through the vertex without contrast.  Comparison: 07/02/2011  Findings: The brain shows generalized atrophy.  No sign of old or acute focal infarction, mass lesion, hemorrhage, hydrocephalus or extra-axial collection.  The calvarium is unremarkable.  Sinuses, middle ears and mastoids are clear.  IMPRESSION: Negative head CT.  Mild generalized atrophy.  No focal or acute finding.  Original Report Authenticated By: Thomasenia Sales, M.D.   Mri Brain Without Contrast  12/16/2011  *RADIOLOGY REPORT*  Clinical Data:  Left sided numbness.  Facial droop  MRI HEAD WITHOUT CONTRAST MRA HEAD WITHOUT CONTRAST  Technique:  Multiplanar, multiecho pulse sequences of the brain and surrounding structures were obtained without intravenous contrast. Angiographic images of the head were obtained using MRA technique without contrast.  Comparison:  CT 12/15/2011  MRI HEAD  Findings:  Sub centimeter  acute infarct in the right frontal white matter.  No other areas of acute infarct.  Mild chronic microvascular ischemic change in the white matter. Ventricle size is normal.  Negative for hemorrhage or mass. Paranasal sinuses are clear.  IMPRESSION: Acute infarct in the right frontal white matter adjacent to the frontal horn of the lateral ventricle.  MRA HEAD  Findings: Left vertebral artery is dominant.  Small right vertebral artery with a moderate stenosis distally.  Mild irregularity the basilar due to atherosclerotic disease.  PICA is patent bilaterally.  Superior cerebellar and posterior cerebral arteries are patent.  Fetal origin of the posterior cerebral artery bilaterally.  Basilar artery ends in the superior cerebellar  arteries.  Internal carotid artery is patent bilaterally with mild atherosclerotic disease in the cavernous segment.  Anterior and middle cerebral arteries are patent bilaterally.  Mild irregularity of the middle cerebral artery branches bilaterally suggesting atherosclerotic disease.  Negative for cerebral aneurysm.  IMPRESSION: Mild to moderate intracranial atherosclerotic disease without large vessel occlusion.  Original Report Authenticated By: Camelia Phenes, M.D.   Mr Mra Head/brain Wo Cm  12/16/2011  *RADIOLOGY REPORT*  Clinical Data:  Left sided numbness.  Facial droop  MRI HEAD WITHOUT CONTRAST MRA HEAD WITHOUT CONTRAST  Technique:  Multiplanar, multiecho pulse sequences of the brain and surrounding structures were obtained without intravenous contrast. Angiographic images of the head were obtained using MRA technique without contrast.  Comparison:  CT 12/15/2011  MRI HEAD  Findings:  Sub centimeter acute infarct in the right frontal white matter.  No other areas of acute infarct.  Mild chronic microvascular ischemic change in the white matter. Ventricle size is normal.  Negative for hemorrhage or mass. Paranasal sinuses are clear.  IMPRESSION: Acute infarct in the right frontal white matter adjacent to the frontal horn of the lateral ventricle.  MRA HEAD  Findings: Left vertebral artery is dominant.  Small right vertebral artery with a moderate stenosis distally.  Mild irregularity the basilar due to atherosclerotic disease.  PICA is patent bilaterally.  Superior cerebellar and posterior cerebral arteries are patent.  Fetal origin of the posterior cerebral artery bilaterally.  Basilar artery ends in the superior cerebellar arteries.  Internal carotid artery is patent bilaterally with mild atherosclerotic disease in the cavernous segment.  Anterior and middle cerebral arteries are patent bilaterally.  Mild irregularity of the middle cerebral artery branches bilaterally suggesting atherosclerotic disease.   Negative for cerebral aneurysm.  IMPRESSION: Mild to moderate intracranial atherosclerotic disease without large vessel occlusion.  Original Report Authenticated By: Camelia Phenes, M.D.    Scheduled Meds:   . sodium chloride   Intravenous STAT  . dipyridamole-aspirin  1 capsule Oral Daily  . enoxaparin  30 mg Subcutaneous Q12H  . fluconazole  150 mg Oral Once  . glimepiride  4 mg Oral QAC breakfast  . insulin aspart  6 Units Subcutaneous TID WC  . insulin glargine  25 Units Subcutaneous QHS  . insulin regular      . lisinopril  10 mg Oral Daily  . living well with diabetes book   Does not apply Once  . simvastatin  40 mg Oral QHS  . DISCONTD: aspirin  325 mg Oral Once  . DISCONTD: simvastatin  20 mg Oral QHS   Continuous Infusions:   . sodium chloride 100 mL/hr at 12/15/11 2120    Principal Problem:  *CVA (cerebral infarction) Active Problems:  HYPERLIPIDEMIA  HYPERTENSION  DM type 2, uncontrolled, with neuropathy  Dizziness  Facial droop  Hyperglycemia  Mild dehydration  Numbness  TIA (transient ischemic attack)  Candiduria

## 2011-12-16 NOTE — Progress Notes (Signed)
Stroke Team Progress Note  HISTORY [name] is an 56 y.o. female a history of apparent TIA 5 years ago, diabetes mellitus coronary artery disease and marked obesity, who presented to [facility] with acute onset of facial weakness and numbness involving left arm and leg. Patient has been on Aggrenox twice a day as well as aspirin 81 mg per day. Scan of her head showed no acute intracranial abnormality. She was referred to [facility] for further evaluation to rule out acute stroke. Deficits resolved prior to arriving at [facility]. NIH stroke scale was 2 on arrival at [facility] and 0 at [facility]. Patient had an MRI as well as MRA of her brain in February 2013 which were unremarkable.   Patient was not a TPA candidate secondary to resolved symptoms. She was admitted for further evaluation and treatment.  SUBJECTIVE No family is at the bedside.  Overall she feels her condition is stable. She has no complaints; watching TV.  OBJECTIVE Most recent Vital Signs: Filed Vitals:   12/16/11 0200 12/16/11 0300 12/16/11 0600 12/16/11 0951  BP: 176/76 133/57 169/74 159/87  Pulse: 78 79 80 80  Temp: 97.9 F (36.6 C)  98 F (36.7 C) 97.7 F (36.5 C)  TempSrc: Oral  Oral Oral  Resp: 20  20 20   Height:      Weight:      SpO2: 100%  100% 100%   CBG (last 3)  Basename 12/16/11 0656 12/16/11 0113 12/15/11 2119  GLUCAP 86 116* 79   Intake/Output from previous day:   IV Fluid Intake:     . sodium chloride 100 mL/hr at 12/15/11 2120   MEDICATIONS    . sodium chloride   Intravenous STAT  . diphenhydrAMINE  12.5 mg Intravenous Once  . dipyridamole-aspirin  1 capsule Oral Daily  . enoxaparin  30 mg Subcutaneous Q12H  . glimepiride  4 mg Oral QAC breakfast  . insulin aspart  6 Units Subcutaneous TID WC  . insulin glargine  25 Units Subcutaneous QHS  . insulin regular  10 Units Subcutaneous Once  . insulin regular      . lisinopril  10 mg Oral  Daily  . metoCLOPramide (REGLAN) injection  10 mg Intravenous Once  . simvastatin  20 mg Oral QHS  . sodium chloride  1,000 mL Intravenous Once  . DISCONTD: aspirin  325 mg Oral Once   PRN:  morphine injection, ondansetron (ZOFRAN) IV  Diet:  Carb Control thin liquids Activity:  Bedrest and Bathroom privileges with assistance DVT Prophylaxis:  Lovenox 30 mg sq daily   CLINICALLY SIGNIFICANT STUDIES Basic Metabolic Panel:  Lab 12/15/11 1610 12/15/11 1222  NA -- 137  K -- 4.4  CL -- 97  CO2 -- 28  GLUCOSE -- 282*  BUN -- 14  CREATININE 0.77 0.80  CALCIUM -- 9.6  MG -- --  PHOS -- --   Liver Function Tests:  Lab 12/15/11 1222  AST 17  ALT 23  ALKPHOS 186*  BILITOT 0.4  PROT 7.9  ALBUMIN 3.7   CBC:  Lab 12/15/11 2059 12/15/11 1222  WBC 10.2 10.1  NEUTROABS -- 7.7  HGB 12.3 13.6  HCT 37.6 41.0  MCV 86.8 84.0  PLT 203 249   Coagulation:  Lab 12/15/11 1222  LABPROT 12.8  INR 0.94   Cardiac Enzymes:  Lab 12/15/11 1222  CKTOTAL 49  CKMB 1.8  CKMBINDEX --  TROPONINI <0.30   Urinalysis:  Lab 12/15/11 1619  COLORURINE YELLOW  LABSPEC 1.022  PHURINE 5.5  GLUCOSEU NEGATIVE  HGBUR NEGATIVE  BILIRUBINUR SMALL*  KETONESUR 15*  PROTEINUR NEGATIVE  UROBILINOGEN 1.0  NITRITE NEGATIVE  LEUKOCYTESUR MODERATE*   Lipid Panel    Component Value Date/Time   CHOL 201* 12/16/2011 0507   TRIG 180* 12/16/2011 0507   HDL 38* 12/16/2011 0507   CHOLHDL 5.3 12/16/2011 0507   VLDL 36 12/16/2011 0507   LDLCALC 127* 12/16/2011 0507   HgbA1C  Lab Results  Component Value Date   HGBA1C 9.3* 12/15/2011    Urine Drug Screen:     Component Value Date/Time   LABOPIA NONE DETECTED 12/15/2011 1619   COCAINSCRNUR NONE DETECTED 12/15/2011 1619   LABBENZ NONE DETECTED 12/15/2011 1619   AMPHETMU NONE DETECTED 12/15/2011 1619   THCU NONE DETECTED 12/15/2011 1619   LABBARB NONE DETECTED 12/15/2011 1619    Alcohol Level: No results found for this basename: ETH:2 in the last 168  hours  CT of the brain  12/15/2011   Negative head CT.  Mild generalized atrophy.  No focal or acute finding.  MRI of the brain  12/16/2011  Acute infarct in the right frontal white matter adjacent to the frontal horn of the lateral ventricle.   MRA of the brain  12/16/2011  Mild to moderate intracranial atherosclerotic disease without large vessel occlusion.    2D Echocardiogram    Carotid Doppler  No internal carotid artery stenosis bilaterally. Vertebrals with antegrade flow bilaterally.   CXR  12/15/2011  * No active cardiopulmonary disease.  EKG  normal EKG, normal sinus rhythm, nonspecific ST and T waves changes.   Therapy Recommendations PT -OP, OT -none  Physical Exam  Pleasant middle-aged African American lady currently not in distress.Awake alert. Afebrile. Head is nontraumatic. Neck is supple without bruit. Hearing is normal. Cardiac exam no murmur or gallop. Lungs are clear to auscultation. Distal pulses are well felt.  Neurological Exam : Awake alert oriented x 3 normal speech and language. Mild left lower face asymmetry. Tongue midline. No drift. Mild diminished fine finger movements on left. Orbits right over left upper extremity. Mild left grip weak.. Normal sensation . Normal coordination. :  ASSESSMENT [name] is a 56 y.o. female with a small right frontal white matter infarct secondary to likely small vessel disease, workup underway.  On dipyridamole SR 250 mg/aspirin 25 mg orally twice a day prior to admission. Now on dipyridamole SR 250 mg/aspirin 25 mg orally twice a day for secondary stroke prevention. Patient with resultant balance deficits.  -obesity, Body mass index is 36.72 kg/(m^2). 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 Delia Heady, Stroke Center Medical Director, has personally reviewed chart, pertinent data, examined the patient and developed the plan of care. Pager:  [phone]

## 2011-12-16 NOTE — Progress Notes (Signed)
Occupational Therapy Evaluation Patient Details Name: [name] MRN: [id number] DOB: November 11, 1955 Today's Date: 12/16/2011 Time: 7829-5621 OT Time Calculation (min): 25 min  OT Assessment / Plan / Recommendation Clinical Impression  Pt admitted with acute onset of left sided weakness and numbness, with symptoms resolving in the ED.  MRI showed an acute right white matter infarct. Pt able to demonstrate basic ADLs and functional transfers at independent/supervision level.  Pt will have necessary level of supervision.  No further acute OT at this time. Please re-order if needed.    OT Assessment  Patient does not need any further OT services    Follow Up Recommendations  Supervision/Assistance - 24 hour;No OT follow up    Barriers to Discharge      Equipment Recommendations  None recommended by PT    Recommendations for Other Services    Frequency       Precautions / Restrictions Precautions Precautions: Fall Restrictions Weight Bearing Restrictions: No   Pertinent Vitals/Pain See vitals    ADL  Upper Body Dressing: Performed;Independent Where Assessed - Upper Body Dressing: Unsupported sitting Lower Body Dressing: Independent;Performed Where Assessed - Lower Body Dressing: Unsupported sit to stand Toilet Transfer: Performed;Supervision/safety Toilet Transfer Method: Other (comment) (ambulation) Toilet Transfer Equipment: Other (comment) (chair) Equipment Used: Gait belt Transfers/Ambulation Related to ADLs: Supervision for basic functional transfer/ambulation.  Intermittent min assist for high level balance tasks during ambulation in hallway, but still overall supervision. ADL Comments: Pt near baseline level with basic ADLs.    OT Diagnosis:    OT Problem List:   OT Treatment Interventions:     OT Goals    Visit Information  Last OT Received On: 12/16/11 Assistance Needed: +1 PT/OT Co-Evaluation/Treatment: Yes    Subjective Data      Prior  Functioning  Vision/Perception  Home Living Lives With: Alone Available Help at Discharge: Family;Available 24 hours/day Type of Home: House Home Access: Stairs to enter [employer] of Steps: 1 Home Layout: One level Bathroom Shower/Tub: Tub/shower unit;Curtain Firefighter: Standard Home Adaptive Equipment: Environmental consultant - rolling Prior Function Level of Independence: Independent Able to Take Stairs?: Yes Driving: Yes Vocation: Full time employment 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 Dominant Hand: Right   Vision - Assessment Eye Alignment: Within Functional Limits  Cognition  Overall Cognitive Status: Appears within functional limits for tasks assessed/performed Arousal/Alertness: Awake/alert Orientation Level: Appears intact for tasks assessed Behavior During Session: [facility] for tasks performed    Extremity/Trunk Assessment Right Upper Extremity Assessment RUE ROM/Strength/Tone: Within functional levels RUE Sensation: WFL - Light Touch;WFL - Proprioception RUE Coordination: WFL - gross/fine motor Left Upper Extremity Assessment LUE ROM/Strength/Tone: Within functional levels LUE Sensation: Deficits LUE Sensation Deficits: Reports tingling in digits 2-4 but [facility] for all tasks. LUE Coordination: WFL - gross/fine motor Right Lower Extremity Assessment RLE ROM/Strength/Tone: Within functional levels RLE Sensation: WFL - Light Touch;WFL - Proprioception RLE Coordination: WFL - gross/fine motor Left Lower Extremity Assessment LLE ROM/Strength/Tone: Within functional levels LLE Sensation: WFL - Light Touch;WFL - Proprioception LLE Coordination: WFL - gross/fine motor Trunk Assessment Trunk Assessment: Normal   Mobility Bed Mobility Bed Mobility: Rolling Right;Rolling Left;Left Sidelying to Sit Rolling Right: 7: Independent Rolling Left: 7: Independent Left Sidelying to Sit: 6: Modified independent (Device/Increase time) Transfers Sit  to Stand: 5: Supervision;From bed Stand to Sit: 6: Modified independent (Device/Increase time);To chair/3-in-1;With upper extremity assist;With armrests Details for Transfer Assistance: supervision for safety as pt slightly unsteady   Exercise    Balance Standardized  Balance Assessment Standardized Balance Assessment: Dynamic Gait Index Dynamic Gait Index Level Surface: Mild Impairment Change in Gait Speed: Mild Impairment Gait with Horizontal Head Turns: Mild Impairment Gait with Vertical Head Turns: Mild Impairment Gait and Pivot Turn: Mild Impairment Step Over Obstacle: Normal Step Around Obstacles: Mild Impairment Steps: Mild Impairment Total Score: 17   End of Session OT - End of Session Equipment Utilized During Treatment: Gait belt Activity Tolerance: Patient tolerated treatment well Patient left: in chair;with call bell/phone within reach;with nursing in room Nurse Communication: Mobility status  GO    12/16/2011 Cipriano Mile OTR/L Pager [phone] Office [phone]  Cipriano Mile 12/16/2011, 3:17 PM

## 2011-12-17 DIAGNOSIS — E1149 Type 2 diabetes mellitus with other diabetic neurological complication: Secondary | ICD-10-CM

## 2011-12-17 DIAGNOSIS — R202 Paresthesia of skin: Secondary | ICD-10-CM | POA: Diagnosis not present

## 2011-12-17 DIAGNOSIS — E1142 Type 2 diabetes mellitus with diabetic polyneuropathy: Secondary | ICD-10-CM

## 2011-12-17 DIAGNOSIS — R7309 Other abnormal glucose: Secondary | ICD-10-CM

## 2011-12-17 DIAGNOSIS — R42 Dizziness and giddiness: Secondary | ICD-10-CM

## 2011-12-17 DIAGNOSIS — B3749 Other urogenital candidiasis: Secondary | ICD-10-CM

## 2011-12-17 DIAGNOSIS — I6350 Cerebral infarction due to unspecified occlusion or stenosis of unspecified cerebral artery: Principal | ICD-10-CM

## 2011-12-17 LAB — GLUCOSE, CAPILLARY
Glucose-Capillary: 210 mg/dL — ABNORMAL HIGH (ref 70–99)
Glucose-Capillary: 173 mg/dL — ABNORMAL HIGH (ref 70–99)

## 2011-12-17 MED ORDER — CLOPIDOGREL BISULFATE 75 MG PO TABS: 75.0000 mg | ORAL_TABLET | Freq: Every day | ORAL | Status: DC

## 2011-12-17 MED ORDER — ACETAMINOPHEN 325 MG PO TABS: 650.0000 mg | ORAL_TABLET | Freq: Four times a day (QID) | ORAL | Status: DC | PRN

## 2011-12-17 MED ORDER — SIMVASTATIN 20 MG PO TABS: 40.0000 mg | ORAL_TABLET | Freq: Every day | ORAL | Status: DC

## 2011-12-17 MED ORDER — CLOPIDOGREL BISULFATE 75 MG PO TABS
75.0000 mg | ORAL_TABLET | Freq: Every day | ORAL | Status: DC
Start: 2011-12-17 — End: 2011-12-17
  Administered 2011-12-17: 75 mg via ORAL
  Filled 2011-12-17: qty 1

## 2011-12-17 NOTE — Progress Notes (Signed)
PCP: [name], MD  Brief HPI:  56 year old female with a history of recurrent syncopal episodes and TIAs who also has a history of CVA in the past. Patient also has history of coronary artery disease and at some point had loop recorder in place for a while. She presented because of another episode of dizziness syncopal episode and facial droop at work. Symptoms occurred suddenly. She was slightly dehydrated from the heat but for the most part was doing housework without a problem. It started with the dizziness followed by slurred speech and the patient passed out momentarily. EMS was called and she was taken [facility]. Initial evaluation was uneventful but she was admitted for possible repeat TIAs. She also mentioned a headache at admission.  Past medical history:  Past Medical History  Diagnosis Date  . Coronary atherosclerosis of native coronary artery     non obstructive by cath in 2008 and 2010  . Unspecified essential hypertension   . Other and unspecified hyperlipidemia   . Syncope and collapse     near-syncopal episode in November/2008  . Unspecified cardiovascular disease     Decreased left-ventricular fxn  . 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 Unspecified hereditary and idiopathic peripheral neuropathy   . Obesity, unspecified   . Esophageal reflux   . History of diabetes mellitus, type II   . Diaphragmatic hernia without mention of obstruction or gangrene   . Irritable bowel syndrome   . Morbid obesity     Consultants: Neurology  Procedures: None  Subjective: Patient mentions that her left hand and leg got numb starting at 2Am this morning. This was followed by onset of headache on right side. Denies any visual disturbance. She also experienced pain in the right neck area.   Objective: Vital signs in last 24 hours: Temp:  [97.7 F (36.5 C)-98.3 F (36.8 C)] 98.3 F (36.8 C) (07/31 0600) Pulse Rate:   [80-88] 83  (07/31 0600) Resp:  [20] 20  (07/31 0600) BP: (135-159)/(60-93) 154/60 mmHg (07/31 0600) SpO2:  [98 %-100 %] 98 % (07/31 0600) Weight change:  Last BM Date: 12/16/11  Intake/Output from previous day:   Intake/Output this shift:    General appearance: alert, cooperative, appears stated age and no distress Head: Normocephalic, without obvious abnormality, atraumatic Resp: clear to auscultation bilaterally Cardio: regular rate and rhythm, S1, S2 normal, no murmur, click, rub or gallop GI: soft, non-tender; bowel sounds normal; no masses,  no organomegaly Extremities: extremities normal, atraumatic, no cyanosis or edema Pulses: 2+ and symmetric Neurologic: She is alert and oriented x3. Cranial nerves are intact. Motor strength is equal. Decrease sensation noted in right hand to touch.  Lab Results:  Basename 12/15/11 2059 12/15/11 1222  WBC 10.2 10.1  HGB 12.3 13.6  HCT 37.6 41.0  PLT 203 249   BMET  Basename 12/15/11 2059 12/15/11 1222  NA -- 137  K -- 4.4  CL -- 97  CO2 -- 28  GLUCOSE -- 282*  BUN -- 14  CREATININE 0.77 0.80  CALCIUM -- 9.6  ALT -- 23    Studies/Results: Dg Chest 2 View  12/15/2011  *RADIOLOGY REPORT*  Clinical Data: Dizziness  CHEST - 2 VIEW  Comparison: 11/26/2011  Findings: Normal heart size.  Under aerated and grossly clear lungs.  No pneumothorax.  No pleural effusions.  Stable thoracic spine.  IMPRESSION: No active cardiopulmonary disease.  Original Report Authenticated By: Marlowe Aschoff  HOSS, M.D.   Ct Head Wo Contrast  12/15/2011  *RADIOLOGY REPORT*  Clinical Data: Dizziness.  Nausea.  Headache.  Left arm numbness.  CT HEAD WITHOUT CONTRAST  Technique:  Contiguous axial images were obtained from the base of the skull through the vertex without contrast.  Comparison: 07/02/2011  Findings: The brain shows generalized atrophy.  No sign of old or acute focal infarction, mass lesion, hemorrhage, hydrocephalus or extra-axial collection.  The  calvarium is unremarkable.  Sinuses, middle ears and mastoids are clear.  IMPRESSION: Negative head CT.  Mild generalized atrophy.  No focal or acute finding.  Original Report Authenticated By: Thomasenia Sales, M.D.   Mri Brain Without Contrast  12/16/2011  *RADIOLOGY REPORT*  Clinical Data:  Left sided numbness.  Facial droop  MRI HEAD WITHOUT CONTRAST MRA HEAD WITHOUT CONTRAST  Technique:  Multiplanar, multiecho pulse sequences of the brain and surrounding structures were obtained without intravenous contrast. Angiographic images of the head were obtained using MRA technique without contrast.  Comparison:  CT 12/15/2011  MRI HEAD  Findings:  Sub centimeter acute infarct in the right frontal white matter.  No other areas of acute infarct.  Mild chronic microvascular ischemic change in the white matter. Ventricle size is normal.  Negative for hemorrhage or mass. Paranasal sinuses are clear.  IMPRESSION: Acute infarct in the right frontal white matter adjacent to the frontal horn of the lateral ventricle.  MRA HEAD  Findings: Left vertebral artery is dominant.  Small right vertebral artery with a moderate stenosis distally.  Mild irregularity the basilar due to atherosclerotic disease.  PICA is patent bilaterally.  Superior cerebellar and posterior cerebral arteries are patent.  Fetal origin of the posterior cerebral artery bilaterally.  Basilar artery ends in the superior cerebellar arteries.  Internal carotid artery is patent bilaterally with mild atherosclerotic disease in the cavernous segment.  Anterior and middle cerebral arteries are patent bilaterally.  Mild irregularity of the middle cerebral artery branches bilaterally suggesting atherosclerotic disease.  Negative for cerebral aneurysm.  IMPRESSION: Mild to moderate intracranial atherosclerotic disease without large vessel occlusion.  Original Report Authenticated By: Camelia Phenes, M.D.   Mr Mra Head/brain Wo Cm  12/16/2011  *RADIOLOGY REPORT*   Clinical Data:  Left sided numbness.  Facial droop  MRI HEAD WITHOUT CONTRAST MRA HEAD WITHOUT CONTRAST  Technique:  Multiplanar, multiecho pulse sequences of the brain and surrounding structures were obtained without intravenous contrast. Angiographic images of the head were obtained using MRA technique without contrast.  Comparison:  CT 12/15/2011  MRI HEAD  Findings:  Sub centimeter acute infarct in the right frontal white matter.  No other areas of acute infarct.  Mild chronic microvascular ischemic change in the white matter. Ventricle size is normal.  Negative for hemorrhage or mass. Paranasal sinuses are clear.  IMPRESSION: Acute infarct in the right frontal white matter adjacent to the frontal horn of the lateral ventricle.  MRA HEAD  Findings: Left vertebral artery is dominant.  Small right vertebral artery with a moderate stenosis distally.  Mild irregularity the basilar due to atherosclerotic disease.  PICA is patent bilaterally.  Superior cerebellar and posterior cerebral arteries are patent.  Fetal origin of the posterior cerebral artery bilaterally.  Basilar artery ends in the superior cerebellar arteries.  Internal carotid artery is patent bilaterally with mild atherosclerotic disease in the cavernous segment.  Anterior and middle cerebral arteries are patent bilaterally.  Mild irregularity of the middle cerebral artery branches bilaterally suggesting atherosclerotic disease.  Negative for cerebral aneurysm.  IMPRESSION: Mild to moderate intracranial atherosclerotic disease without large vessel occlusion.  Original Report Authenticated By: Camelia Phenes, M.D.    Medications:  Scheduled:   . sodium chloride   Intravenous STAT  . dipyridamole-aspirin  1 capsule Oral Daily  . enoxaparin  30 mg Subcutaneous Q12H  . fluconazole  100 mg Oral Daily  . fluconazole  150 mg Oral Once  . glimepiride  4 mg Oral QAC breakfast  . insulin aspart  6 Units Subcutaneous TID WC  . insulin glargine  25  Units Subcutaneous QHS  . lisinopril  10 mg Oral Daily  . living well with diabetes book   Does not apply Once  . simvastatin  40 mg Oral QHS  . DISCONTD: simvastatin  20 mg Oral QHS   Continuous:   . sodium chloride 100 mL/hr at 12/15/11 2120   WUJ:[id number], morphine injection  Assessment/Plan:  Principal Problem:  *CVA (cerebral infarction) Active Problems:  HYPERLIPIDEMIA  HYPERTENSION  DM type 2, uncontrolled, with neuropathy  Dizziness  Facial droop  Hyperglycemia  Mild dehydration  Numbness  TIA (transient ischemic attack)  Candiduria   New Right sided Paresthesia Etiology unclear. ?Migraine/?cervical disk disease. Neurology notified.  Acute Right Brain CVA Patient presented with complaints of dizziness. MRI of the brain showed an acute right frontal lobe CVA. The patient has minimal residual weakness as a result of a CVA. Work up is unrevealing for reversible process. ECHO shows hypokinesis with 45% EF. Similar to previous ECHO. Carotid duplex shows no evidence of hemodynamically significant carotid artery stenosis. She's been evaluated by both physical therapy and occupational therapy both of whom recommended outpatient therapy for balance training. Await recommendations from the stroke team regarding antiplatelet therapy. May need to change her to Plavix.  Hyperlipidemia Patient has a known history of hyperlipidemia however evaluation of her cholesterol shows an LDL of 127. She's been on simvastatin 20 mg for more than a year. Dose of simvastatin was increased  both for independent stroke risk reduction as well as treatment of her hyperlipidemia.  Hypertension The patient's blood pressure is moderately well controlled although not at goal. Defer to her primary care physician as an outpatient to further titrate her medications.  Diabetes mellitus type 2 Currently the patient's blood sugars are controlled however her hemoglobin A1c of 9.5 reflects poorly  controlled diabetes of chronic basis. Defer to her primary care physician as an outpatient to further titrate her medications.  Candiduria The patient was noted to have yeast in her urine. In the setting of poorly controlled diabetes she was treated with Diflucan.  Code Status: Full code  Family Communication: None. Patient is fully awake and alert unable to communicate her medical information to family members.  Disposition Plan: Expected disposition is home at time of discharge. Await input from Neurology regarding new symptoms.  DVT Prophylaxis Enoxaparin   LOS: 2 days   [facility]  [facility] Pager [phone] 12/17/2011, 9:09 AM

## 2011-12-17 NOTE — Progress Notes (Signed)
Physical Therapy Treatment Patient Details Name: [name] MRN: [id number] DOB: Apr 12, 1956 Today's Date: 12/17/2011 Time: 0102-7253 PT Time Calculation (min): 19 min  PT Assessment / Plan / Recommendation Comments on Treatment Session  Pt ambulated with improved stability using straight cane. Pt educated on importance of beginning and mainitaining an exercise routine to improve overall health and decrease risk factors; pt verbalized understanding. Recommended to pt that someone check on her at home frequently and to have someone near by if pt is going to be ambulating much. Pt reported her neice will be staying with her tomorrow.Recommend straight cane for discharge as well as outpatient PT to work on improving balance to decrease fall risk and improve endurance. Pt's case manager was contacted about recommendations due to pt's discharge summary already complete.    Follow Up Recommendations  Outpatient PT; Intermittent supervision    Barriers to Discharge        Equipment Recommendations  Cane (straight cane)    Recommendations for Other Services    Frequency Min 4X/week   Plan Equipment recommendations need to be updated    Precautions / Restrictions Precautions Precautions: Fall Restrictions Weight Bearing Restrictions: No   Pertinent Vitals/Pain BP: 188/77; MD was notified of BP from prior cancel note.    Mobility  Bed Mobility Details for Bed Mobility Assistance: Pt in chair upon presentation. Transfers Transfers: Sit to Stand;Stand to Sit Sit to Stand: 5: Supervision;From chair/3-in-1;With upper extremity assist Stand to Sit: 6: Modified independent (Device/Increase time);With upper extremity assist;To chair/3-in-1 Details for Transfer Assistance: Pt required supervision for safety due to slight unsteadiness. Ambulation/Gait Ambulation/Gait Assistance: 5: Supervision;4: Min guard Ambulation Distance (Feet): 300 Feet Assistive device: Straight cane Ambulation/Gait  Assistance Details: Pt required min guard as pt began to fatigue. Gait training done with a straight cane, which improved pt stability with less episodes of LOB, although stability decreased slightly as pt fatigued. Pt required minimum verbal cueing for proper cane sequence during ambulation. Gait Pattern: Step-through pattern;Decreased stride length;Wide base of support;Lateral trunk lean to right General Gait Details: Pt had lateral trunk lean to the right when using the cane and required verbal cueing to maintain midline with the trunk. Stairs: No     PT Goals Acute Rehab PT Goals PT Goal: Sit to Stand - Progress: Progressing toward goal PT Goal: Ambulate - Progress: Progressing toward goal  Visit Information  Last PT Received On: 12/17/11 Assistance Needed: +1    Subjective Data  Subjective: "I am feeling a lot better and am going home this afternoon."   Cognition  Overall Cognitive Status: Appears within functional limits for tasks assessed/performed Arousal/Alertness: Awake/alert Orientation Level: Appears intact for tasks assessed Behavior During Session: [facility] for tasks performed    Balance     End of Session PT - End of Session Equipment Utilized During Treatment: Gait belt Activity Tolerance: Patient tolerated treatment well Patient left: in chair;with call bell/phone within reach Nurse Communication: Mobility status   GP     [name] 12/17/2011, 4:09 PM

## 2011-12-17 NOTE — Progress Notes (Signed)
I have read and agree with below treatment note and plan. Case manager aware of OPPT and DME needs and is following up with MD.  Ivonne Andrew, PT, DPT Pager: [phone]

## 2011-12-17 NOTE — Discharge Summary (Signed)
Physician Discharge Summary  Patient ID: [name] MRN: [id number] DOB/AGE: 57-Mar-1957 57 y.o.  Admit date: 12/15/2011 Discharge date: 12/17/2011  PCP: [name], MD  DISCHARGE DIAGNOSES:  Principal Problem:  *Paresthesia of right arm and leg Active Problems:  HYPERLIPIDEMIA  HYPERTENSION  CVA (cerebral infarction)  DM type 2, uncontrolled, with neuropathy  Dizziness  Facial droop  Hyperglycemia  Mild dehydration  Numbness  TIA (transient ischemic attack)  Candiduria   RECOMMENDATIONS TO PCP: 1. Please address poor diabetes control  DISCHARGE CONDITION: fair  INITIAL HISTORY: 57 year old female with a history of recurrent syncopal episodes and TIAs who also has a history of CVA in the past. Patient also has history of coronary artery disease and at some point had loop recorder in place for a while. She presented because of another episode of dizziness syncopal episode and facial droop at work. Symptoms occurred suddenly. She was slightly dehydrated from the heat but for the most part was doing housework without a problem. It started with the dizziness followed by slurred speech and the patient passed out momentarily. EMS was called and she was taken [facility]. Initial evaluation was uneventful but she was admitted for possible repeat TIAs. She also mentioned a headache at admission.   HOSPITAL COURSE:   Acute Right Brain CVA  Patient presented with complaints of dizziness. MRI of the brain showed an acute right frontal lobe CVA. The patient has minimal residual weakness as a result of a CVA. Work up is unrevealing for reversible process. ECHO shows hypokinesis with 45% EF. Similar to previous ECHO. Carotid duplex shows no evidence of hemodynamically significant carotid artery stenosis. She's been evaluated by both physical therapy and occupational therapy both of whom recommended outpatient therapy for balance training. Stroke team recommends Plavix at  this time.   New Right sided Paresthesia on 7/31 Etiology unclear. Neurology feels this could be from a new TIA or cervical disk disease. Work up is negative. She is cleared for discharge.  Hyperlipidemia  Patient has a known history of hyperlipidemia however evaluation of her cholesterol shows an LDL of 127. She's been on simvastatin 20 mg for more than a year. Dose of simvastatin was increased both for independent stroke risk reduction as well as treatment of her hyperlipidemia.   Hypertension  The patient's blood pressure is moderately well controlled although not at goal. Defer to her primary care physician as an outpatient to further titrate her medications.   Diabetes mellitus type 2  Currently the patient's blood sugars are controlled however her hemoglobin A1c of 9.5 reflects poorly controlled diabetes of chronic basis. Defer to her primary care physician as an outpatient to further titrate her medications.   Candiduria  The patient was noted to have yeast in her urine. In the setting of poorly controlled diabetes she was treated with Diflucan.     IMAGING STUDIES Dg Chest 2 View  12/15/2011  *RADIOLOGY REPORT*  Clinical Data: Dizziness  CHEST - 2 VIEW  Comparison: 11/26/2011  Findings: Normal heart size.  Under aerated and grossly clear lungs.  No pneumothorax.  No pleural effusions.  Stable thoracic spine.  IMPRESSION: No active cardiopulmonary disease.  Original Report Authenticated By: Donavan Burnet, M.D.   Dg Chest 2 View  11/26/2011  *RADIOLOGY REPORT*  Clinical Data: Shortness of breath and weakness.  CHEST - 2 VIEW  Comparison: PA and lateral chest 05/29/2011.  Findings: Lungs are clear.  Heart size is normal.  No pneumothorax or  pleural fluid.  IMPRESSION: Negative chest.  Original Report Authenticated By: Bernadene Bell. Maricela Curet, M.D.   Ct Head Wo Contrast  12/15/2011  *RADIOLOGY REPORT*  Clinical Data: Dizziness.  Nausea.  Headache.  Left arm numbness.  CT HEAD WITHOUT  CONTRAST  Technique:  Contiguous axial images were obtained from the base of the skull through the vertex without contrast.  Comparison: 07/02/2011  Findings: The brain shows generalized atrophy.  No sign of old or acute focal infarction, mass lesion, hemorrhage, hydrocephalus or extra-axial collection.  The calvarium is unremarkable.  Sinuses, middle ears and mastoids are clear.  IMPRESSION: Negative head CT.  Mild generalized atrophy.  No focal or acute finding.  Original Report Authenticated By: Thomasenia Sales, M.D.   Mri Brain Without Contrast  12/16/2011  *RADIOLOGY REPORT*  Clinical Data:  Left sided numbness.  Facial droop  MRI HEAD WITHOUT CONTRAST MRA HEAD WITHOUT CONTRAST  Technique:  Multiplanar, multiecho pulse sequences of the brain and surrounding structures were obtained without intravenous contrast. Angiographic images of the head were obtained using MRA technique without contrast.  Comparison:  CT 12/15/2011  MRI HEAD  Findings:  Sub centimeter acute infarct in the right frontal white matter.  No other areas of acute infarct.  Mild chronic microvascular ischemic change in the white matter. Ventricle size is normal.  Negative for hemorrhage or mass. Paranasal sinuses are clear.  IMPRESSION: Acute infarct in the right frontal white matter adjacent to the frontal horn of the lateral ventricle.  MRA HEAD  Findings: Left vertebral artery is dominant.  Small right vertebral artery with a moderate stenosis distally.  Mild irregularity the basilar due to atherosclerotic disease.  PICA is patent bilaterally.  Superior cerebellar and posterior cerebral arteries are patent.  Fetal origin of the posterior cerebral artery bilaterally.  Basilar artery ends in the superior cerebellar arteries.  Internal carotid artery is patent bilaterally with mild atherosclerotic disease in the cavernous segment.  Anterior and middle cerebral arteries are patent bilaterally.  Mild irregularity of the middle cerebral artery  branches bilaterally suggesting atherosclerotic disease.  Negative for cerebral aneurysm.  IMPRESSION: Mild to moderate intracranial atherosclerotic disease without large vessel occlusion.  Original Report Authenticated By: Camelia Phenes, M.D.   Mr Mra Head/brain Wo Cm  12/16/2011  *RADIOLOGY REPORT*  Clinical Data:  Left sided numbness.  Facial droop  MRI HEAD WITHOUT CONTRAST MRA HEAD WITHOUT CONTRAST  Technique:  Multiplanar, multiecho pulse sequences of the brain and surrounding structures were obtained without intravenous contrast. Angiographic images of the head were obtained using MRA technique without contrast.  Comparison:  CT 12/15/2011  MRI HEAD  Findings:  Sub centimeter acute infarct in the right frontal white matter.  No other areas of acute infarct.  Mild chronic microvascular ischemic change in the white matter. Ventricle size is normal.  Negative for hemorrhage or mass. Paranasal sinuses are clear.  IMPRESSION: Acute infarct in the right frontal white matter adjacent to the frontal horn of the lateral ventricle.  MRA HEAD  Findings: Left vertebral artery is dominant.  Small right vertebral artery with a moderate stenosis distally.  Mild irregularity the basilar due to atherosclerotic disease.  PICA is patent bilaterally.  Superior cerebellar and posterior cerebral arteries are patent.  Fetal origin of the posterior cerebral artery bilaterally.  Basilar artery ends in the superior cerebellar arteries.  Internal carotid artery is patent bilaterally with mild atherosclerotic disease in the cavernous segment.  Anterior and middle cerebral arteries are patent bilaterally.  Mild irregularity of the middle cerebral artery branches bilaterally suggesting atherosclerotic disease.  Negative for cerebral aneurysm.  IMPRESSION: Mild to moderate intracranial atherosclerotic disease without large vessel occlusion.  Original Report Authenticated By: Camelia Phenes, M.D.    DISCHARGE EXAMINATION: See  progress note from earlier today  DISPOSITION: 35 mins  Discharge Orders    Future Orders Please Complete By Expires   Diet - low sodium heart healthy      Increase activity slowly      Discharge instructions      Comments:   Be sure to follow up with your PCP in 1 week to discuss the stroke as well as poor control of diabetes and hypertension     Current Discharge Medication List    START taking these medications   Details  clopidogrel (PLAVIX) 75 MG tablet Take 1 tablet (75 mg total) by mouth daily with breakfast. Qty: 30 tablet, Refills: 3      CONTINUE these medications which have CHANGED   Details  simvastatin (ZOCOR) 20 MG tablet Take 2 tablets (40 mg total) by mouth at bedtime. Qty: 30 tablet, Refills: 2      CONTINUE these medications which have NOT CHANGED   Details  glimepiride (AMARYL) 4 MG tablet Take 4 mg by mouth daily before breakfast.      insulin aspart (NOVOLOG) 100 UNIT/ML injection Inject 6 Units into the skin 3 (three) times daily with meals. Qty: 1 vial, Refills: 3    insulin glargine (LANTUS) 100 UNIT/ML injection Inject 25 Units into the skin at bedtime. Qty: 10 mL, Refills: 3    lisinopril (PRINIVIL,ZESTRIL) 10 MG tablet Take 10 mg by mouth daily.      metFORMIN (GLUCOPHAGE) 500 MG tablet Take 500 mg by mouth 2 (two) times daily with a meal.      STOP taking these medications     aspirin 81 MG chewable tablet      dipyridamole-aspirin (AGGRENOX) 25-200 MG per 12 hr capsule        Follow-up Information    Follow up with Gates Rigg, MD. Schedule an appointment as soon as possible for a visit in 2 months.   Contact information:   [address], [address] [facility] [place] [address] [phone]       Follow up with [name], MD. Schedule an appointment as soon as possible for a visit in 1 week.   Contact information:   [address] [place] [address] [phone]            TOTAL DISCHARGE TIME: 35 mins  [facility]  [facility] Pager [phone]  12/17/2011, 1:23 PM

## 2011-12-17 NOTE — Progress Notes (Signed)
I have read and agree with below note.  Laurel Harnden Helen Whitlow PT, DPT  Pager: [phone] 

## 2011-12-17 NOTE — Care Management Note (Signed)
    Page 1 of 1   12/17/2011     5:10:49 PM   CARE MANAGEMENT NOTE 12/17/2011  Patient:  MERRIDITH, DERSHEM   Account Number:  [id number]  Date Initiated:  12/17/2011  Documentation initiated by:  [facility]  Subjective/Objective Assessment:   Admitted with stroke.     Action/Plan:   PT eval-recommended outpatient PT  OT eval-no d/c needs   Anticipated DC Date:  12/17/2011   Anticipated DC Plan:  HOME/SELF CARE      DC Planning Services  CM consult      Choice offered to / List presented to:             Status of service:  Completed, signed off Medicare Important Message given?   (If response is "NO", the following Medicare IM given date fields will be blank) Date Medicare IM given:   Date Additional Medicare IM given:    Discharge Disposition:  HOME/SELF CARE  Per UR Regulation:  Reviewed for med. necessity/level of care/duration of stay  If discussed at Long Length of Stay Meetings, dates discussed:    Comments:  12/16/11 Spoke with patient about outpatient PT, she is agreeable to going to [facility]. She is also agreeable to getting a cane. Contacted [facility] which is contracted with [employer], they will ship the cane to the patient's home. Informed patient and she is agreeable to having the cane shipped to her home. Faxed referral form, order for outpatient PT, PT eval notes,and H and P to Neuro rehab. Faxed face sheet and cane order to [facility].Jacquelynn Cree RN, BSN, CCM

## 2011-12-17 NOTE — Progress Notes (Signed)
PT Cancellation Note  Treatment cancelled today due to medical issues with patient which prohibited therapy. Pt reports 6/10 headache pain on right side of head. Blood pressure was 188/98 in right arm and 200/104 in the left arm. Nursing in pt's room and giving medication, including BP medication. Pt reports she is expecting discharge this afternoon so will check back if time permits.  Krish Bailly 12/17/2011, 2:01 PM

## 2011-12-17 NOTE — Progress Notes (Signed)
Stroke Team Progress Note  HISTORY [name] is an 56 y.o. female a history of apparent TIA 5 years ago, diabetes mellitus coronary artery disease and marked obesity, who presented to [facility] with acute onset of facial weakness and numbness involving left arm and leg. Patient has been on Aggrenox twice a day as well as aspirin 81 mg per day. Scan of her head showed no acute intracranial abnormality. She was referred to [facility] for further evaluation to rule out acute stroke. Deficits resolved prior to arriving at [facility]. NIH stroke scale was 2 on arrival at [facility] and 0 at [facility]. Patient had an MRI as well as MRA of her brain in February 2013 which were unremarkable.   Patient was not a TPA candidate secondary to resolved symptoms. She was admitted for further evaluation and treatment.  SUBJECTIVE No family is at the bedside.  Patient complains of right hand/arm up to her elbow numbness early this morning.  OBJECTIVE Most recent Vital Signs: Filed Vitals:   12/16/11 1839 12/16/11 2200 12/17/11 0200 12/17/11 0600  BP: 158/93 153/72 153/78 154/60  Pulse: 80 85 83 83  Temp: 98.1 F (36.7 C) 97.8 F (36.6 C) 98 F (36.7 C) 98.3 F (36.8 C)  TempSrc: Oral Oral Oral Oral  Resp: 20 20 20 20   Height:      Weight:      SpO2: 100% 98% 99% 98%   CBG (last 3)  Basename 12/17/11 0644 12/16/11 2130 12/16/11 1638  GLUCAP 210* 271* 106*   IV Fluid Intake:     . sodium chloride 100 mL/hr at 12/15/11 2120   MEDICATIONS    . sodium chloride   Intravenous STAT  . dipyridamole-aspirin  1 capsule Oral Daily  . enoxaparin  30 mg Subcutaneous Q12H  . fluconazole  100 mg Oral Daily  . fluconazole  150 mg Oral Once  . glimepiride  4 mg Oral QAC breakfast  . insulin aspart  6 Units Subcutaneous TID WC  . insulin glargine  25 Units Subcutaneous QHS  . lisinopril  10 mg Oral Daily  . living well with diabetes book   Does not  apply Once  . simvastatin  40 mg Oral QHS  . DISCONTD: simvastatin  20 mg Oral QHS   PRN:  acetaminophen, morphine injection  Diet:  Carb Control thin liquids Activity:  Bathroom privileges with assistance DVT Prophylaxis:  Lovenox 30 mg sq daily   CLINICALLY SIGNIFICANT STUDIES Basic Metabolic Panel:  Lab 12/15/11 1610 12/15/11 1222  NA -- 137  K -- 4.4  CL -- 97  CO2 -- 28  GLUCOSE -- 282*  BUN -- 14  CREATININE 0.77 0.80  CALCIUM -- 9.6  MG -- --  PHOS -- --   Liver Function Tests:  Lab 12/15/11 1222  AST 17  ALT 23  ALKPHOS 186*  BILITOT 0.4  PROT 7.9  ALBUMIN 3.7   CBC:  Lab 12/15/11 2059 12/15/11 1222  WBC 10.2 10.1  NEUTROABS -- 7.7  HGB 12.3 13.6  HCT 37.6 41.0  MCV 86.8 84.0  PLT 203 249   Coagulation:  Lab 12/15/11 1222  LABPROT 12.8  INR 0.94   Cardiac Enzymes:  Lab 12/15/11 1222  CKTOTAL 49  CKMB 1.8  CKMBINDEX --  TROPONINI <0.30   Urinalysis:  Lab 12/15/11 1619  COLORURINE YELLOW  LABSPEC 1.022  PHURINE 5.5  GLUCOSEU NEGATIVE  HGBUR NEGATIVE  BILIRUBINUR SMALL*  KETONESUR 15*  PROTEINUR NEGATIVE  UROBILINOGEN 1.0  NITRITE NEGATIVE  LEUKOCYTESUR MODERATE*   Lipid Panel    Component Value Date/Time   CHOL 201* 12/16/2011 0507   TRIG 180* 12/16/2011 0507   HDL 38* 12/16/2011 0507   CHOLHDL 5.3 12/16/2011 0507   VLDL 36 12/16/2011 0507   LDLCALC 127* 12/16/2011 0507   HgbA1C  Lab Results  Component Value Date   HGBA1C 9.3* 12/15/2011    Urine Drug Screen:     Component Value Date/Time   LABOPIA NONE DETECTED 12/15/2011 1619   COCAINSCRNUR NONE DETECTED 12/15/2011 1619   LABBENZ NONE DETECTED 12/15/2011 1619   AMPHETMU NONE DETECTED 12/15/2011 1619   THCU NONE DETECTED 12/15/2011 1619   LABBARB NONE DETECTED 12/15/2011 1619    CT of the brain  12/15/2011   Negative head CT.  Mild generalized atrophy.  No focal or acute finding.  MRI of the brain  12/16/2011  Acute infarct in the right frontal white matter adjacent to the frontal  horn of the lateral ventricle.   MRA of the brain  12/16/2011  Mild to moderate intracranial atherosclerotic disease without large vessel occlusion.    2D Echocardiogram  EF 45-50% with no source of embolus. Mild hypokinesis.  Carotid Doppler  No internal carotid artery stenosis bilaterally. Vertebrals with antegrade flow bilaterally.   CXR  12/15/2011  No active cardiopulmonary disease.  EKG  normal EKG, normal sinus rhythm, nonspecific ST and T waves changes.   Therapy Recommendations PT -OP, OT -none  Physical Exam  Pleasant middle-aged African American lady currently not in distress.Awake alert. Afebrile. Head is nontraumatic. Neck is supple without bruit. Hearing is normal. Cardiac exam no murmur or gallop. Lungs are clear to auscultation. Distal pulses are well felt.  Neurological Exam : Awake alert oriented x 3 normal speech and language. Mild left lower face asymmetry. Tongue midline. No drift. Mild diminished fine finger movements on left. Orbits right over left upper extremity. Mild left grip weak.. Normal sensation . Normal coordination. :  ASSESSMENT [name] is a 56 y.o. female with a small right frontal white matter infarct secondary to small vessel disease. New right hand/arm numbness today either TIA vs carpal tunnel. As sx are now resolved, recommend continued observation. On dipyridamole SR 250 mg/aspirin 25 mg orally twice a day prior to admission. Now on dipyridamole SR 250 mg/aspirin 25 mg orally twice a day for secondary stroke prevention. Patient with resultant balance deficits.  -obesity, Body mass index is 36.72 kg/(m^2). 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 Follow up with Dr. Pearlean Brownie in 2 months.  -Annie Main, MSN, RN, ANVP-BC, ANP-BC, GNP-BC [facility] Pager: [phone] 12/17/2011 11:58 AM  Scribe for Dr. Delia Heady, Stroke Center Medical Director, who has personally reviewed chart, pertinent data, examined the patient and developed the plan of care. Pager:  [phone]  [facility] Pager: [phone] 12/17/2011 10:03 AM  Dr. Delia Heady, Stroke Center Medical Director, has personally reviewed chart, pertinent data, examined the patient and developed the plan of care. Pager:  [phone]

## 2011-12-23 ENCOUNTER — Emergency Department (HOSPITAL_COMMUNITY): Payer: Managed Care, Other (non HMO)

## 2011-12-23 ENCOUNTER — Inpatient Hospital Stay (HOSPITAL_COMMUNITY)
Admission: EM | Admit: 2011-12-23 | Discharge: 2011-12-31 | DRG: 041 | Disposition: A | Discharge status: Home Health | Payer: Managed Care, Other (non HMO) | Attending: Internal Medicine | Admitting: Internal Medicine

## 2011-12-23 ENCOUNTER — Encounter (HOSPITAL_COMMUNITY): Payer: Self-pay | Admitting: Certified Registered Nurse Anesthetist

## 2011-12-23 ENCOUNTER — Encounter (HOSPITAL_COMMUNITY)
Admission: EM | Disposition: A | Discharge status: Home Health | Payer: Self-pay | Source: Home / Self Care | Attending: Internal Medicine

## 2011-12-23 ENCOUNTER — Inpatient Hospital Stay (HOSPITAL_COMMUNITY): Payer: Managed Care, Other (non HMO) | Admitting: Certified Registered Nurse Anesthetist

## 2011-12-23 ENCOUNTER — Encounter (HOSPITAL_COMMUNITY): Payer: Self-pay | Admitting: Emergency Medicine

## 2011-12-23 DIAGNOSIS — R55 Syncope and collapse: Secondary | ICD-10-CM

## 2011-12-23 DIAGNOSIS — E860 Dehydration: Secondary | ICD-10-CM

## 2011-12-23 DIAGNOSIS — IMO0002 Reserved for concepts with insufficient information to code with codable children: Secondary | ICD-10-CM

## 2011-12-23 DIAGNOSIS — Z8673 Personal history of transient ischemic attack (TIA), and cerebral infarction without residual deficits: Secondary | ICD-10-CM

## 2011-12-23 DIAGNOSIS — E1149 Type 2 diabetes mellitus with other diabetic neurological complication: Principal | ICD-10-CM | POA: Diagnosis present

## 2011-12-23 DIAGNOSIS — I2510 Atherosclerotic heart disease of native coronary artery without angina pectoris: Secondary | ICD-10-CM

## 2011-12-23 DIAGNOSIS — Z6836 Body mass index (BMI) 36.0-36.9, adult: Secondary | ICD-10-CM

## 2011-12-23 DIAGNOSIS — E1142 Type 2 diabetes mellitus with diabetic polyneuropathy: Secondary | ICD-10-CM | POA: Diagnosis present

## 2011-12-23 DIAGNOSIS — L97509 Non-pressure chronic ulcer of other part of unspecified foot with unspecified severity: Secondary | ICD-10-CM | POA: Diagnosis present

## 2011-12-23 DIAGNOSIS — G459 Transient cerebral ischemic attack, unspecified: Secondary | ICD-10-CM

## 2011-12-23 DIAGNOSIS — E1165 Type 2 diabetes mellitus with hyperglycemia: Secondary | ICD-10-CM

## 2011-12-23 DIAGNOSIS — K589 Irritable bowel syndrome without diarrhea: Secondary | ICD-10-CM

## 2011-12-23 DIAGNOSIS — E1140 Type 2 diabetes mellitus with diabetic neuropathy, unspecified: Secondary | ICD-10-CM | POA: Diagnosis present

## 2011-12-23 DIAGNOSIS — B3749 Other urogenital candidiasis: Secondary | ICD-10-CM

## 2011-12-23 DIAGNOSIS — Z8249 Family history of ischemic heart disease and other diseases of the circulatory system: Secondary | ICD-10-CM

## 2011-12-23 DIAGNOSIS — R29810 Facial weakness: Secondary | ICD-10-CM

## 2011-12-23 DIAGNOSIS — L0390 Cellulitis, unspecified: Secondary | ICD-10-CM

## 2011-12-23 DIAGNOSIS — E785 Hyperlipidemia, unspecified: Secondary | ICD-10-CM

## 2011-12-23 DIAGNOSIS — L0291 Cutaneous abscess, unspecified: Secondary | ICD-10-CM

## 2011-12-23 DIAGNOSIS — I10 Essential (primary) hypertension: Secondary | ICD-10-CM

## 2011-12-23 DIAGNOSIS — L03039 Cellulitis of unspecified toe: Secondary | ICD-10-CM | POA: Diagnosis present

## 2011-12-23 DIAGNOSIS — L02619 Cutaneous abscess of unspecified foot: Secondary | ICD-10-CM | POA: Diagnosis present

## 2011-12-23 DIAGNOSIS — I639 Cerebral infarction, unspecified: Secondary | ICD-10-CM

## 2011-12-23 DIAGNOSIS — E11649 Type 2 diabetes mellitus with hypoglycemia without coma: Secondary | ICD-10-CM

## 2011-12-23 DIAGNOSIS — L02419 Cutaneous abscess of limb, unspecified: Secondary | ICD-10-CM | POA: Diagnosis present

## 2011-12-23 DIAGNOSIS — G609 Hereditary and idiopathic neuropathy, unspecified: Secondary | ICD-10-CM

## 2011-12-23 DIAGNOSIS — Z880 Allergy status to penicillin: Secondary | ICD-10-CM

## 2011-12-23 DIAGNOSIS — E11621 Type 2 diabetes mellitus with foot ulcer: Secondary | ICD-10-CM

## 2011-12-23 DIAGNOSIS — Z823 Family history of stroke: Secondary | ICD-10-CM

## 2011-12-23 DIAGNOSIS — Z7902 Long term (current) use of antithrombotics/antiplatelets: Secondary | ICD-10-CM

## 2011-12-23 DIAGNOSIS — K449 Diaphragmatic hernia without obstruction or gangrene: Secondary | ICD-10-CM

## 2011-12-23 DIAGNOSIS — L97529 Non-pressure chronic ulcer of other part of left foot with unspecified severity: Secondary | ICD-10-CM | POA: Diagnosis present

## 2011-12-23 DIAGNOSIS — K219 Gastro-esophageal reflux disease without esophagitis: Secondary | ICD-10-CM

## 2011-12-23 DIAGNOSIS — Z7901 Long term (current) use of anticoagulants: Secondary | ICD-10-CM

## 2011-12-23 DIAGNOSIS — L03119 Cellulitis of unspecified part of limb: Secondary | ICD-10-CM | POA: Diagnosis present

## 2011-12-23 DIAGNOSIS — R739 Hyperglycemia, unspecified: Secondary | ICD-10-CM

## 2011-12-23 DIAGNOSIS — Z9089 Acquired absence of other organs: Secondary | ICD-10-CM

## 2011-12-23 DIAGNOSIS — R42 Dizziness and giddiness: Secondary | ICD-10-CM

## 2011-12-23 DIAGNOSIS — E669 Obesity, unspecified: Secondary | ICD-10-CM

## 2011-12-23 DIAGNOSIS — I679 Cerebrovascular disease, unspecified: Secondary | ICD-10-CM | POA: Diagnosis present

## 2011-12-23 DIAGNOSIS — R202 Paresthesia of skin: Secondary | ICD-10-CM

## 2011-12-23 DIAGNOSIS — N390 Urinary tract infection, site not specified: Secondary | ICD-10-CM

## 2011-12-23 DIAGNOSIS — R200 Anesthesia of skin: Secondary | ICD-10-CM

## 2011-12-23 DIAGNOSIS — E1169 Type 2 diabetes mellitus with other specified complication: Secondary | ICD-10-CM | POA: Diagnosis present

## 2011-12-23 DIAGNOSIS — D649 Anemia, unspecified: Secondary | ICD-10-CM

## 2011-12-23 DIAGNOSIS — L03116 Cellulitis of left lower limb: Secondary | ICD-10-CM

## 2011-12-23 DIAGNOSIS — F17200 Nicotine dependence, unspecified, uncomplicated: Secondary | ICD-10-CM | POA: Diagnosis present

## 2011-12-23 DIAGNOSIS — E876 Hypokalemia: Secondary | ICD-10-CM

## 2011-12-23 HISTORY — DX: Cellulitis, unspecified: L03.90

## 2011-12-23 HISTORY — DX: Cerebral infarction, unspecified: I63.9

## 2011-12-23 HISTORY — PX: I&D EXTREMITY: SHX5045

## 2011-12-23 LAB — GLUCOSE, CAPILLARY: Glucose-Capillary: 154 mg/dL — ABNORMAL HIGH (ref 70–99)

## 2011-12-23 LAB — CBC
HCT: 35.6 % — ABNORMAL LOW (ref 36.0–46.0)
HCT: 33.6 % — ABNORMAL LOW (ref 36.0–46.0)
MCH: 28.1 pg (ref 26.0–34.0)
MCHC: 33.4 g/dL (ref 30.0–36.0)
MCHC: 33.6 g/dL (ref 30.0–36.0)
MCV: 84 fL (ref 78.0–100.0)
Platelets: 251 10*3/uL (ref 150–400)
Platelets: 225 10*3/uL (ref 150–400)
Platelets: 227 10*3/uL (ref 150–400)
RBC: 4.06 MIL/uL (ref 3.87–5.11)
RDW: 14.6 % (ref 11.5–15.5)
RDW: 14.4 % (ref 11.5–15.5)
WBC: 22.3 10*3/uL — ABNORMAL HIGH (ref 4.0–10.5)
WBC: 19.8 10*3/uL — ABNORMAL HIGH (ref 4.0–10.5)
WBC: 18.3 10*3/uL — ABNORMAL HIGH (ref 4.0–10.5)

## 2011-12-23 LAB — BASIC METABOLIC PANEL
BUN: 19 mg/dL (ref 6–23)
Chloride: 99 mEq/L (ref 96–112)
Creatinine, Ser: 0.9 mg/dL (ref 0.50–1.10)
GFR calc Af Amer: 81 mL/min — ABNORMAL LOW (ref >90)
GFR calc non Af Amer: 70 mL/min — ABNORMAL LOW (ref >90)

## 2011-12-23 LAB — CREATININE, SERUM
Creatinine, Ser: 0.86 mg/dL (ref 0.50–1.10)
GFR calc Af Amer: 85 mL/min — ABNORMAL LOW (ref >90)
GFR calc non Af Amer: 74 mL/min — ABNORMAL LOW (ref >90)

## 2011-12-23 LAB — SURGICAL PCR SCREEN: MRSA, PCR: NEGATIVE

## 2011-12-23 SURGERY — IRRIGATION AND DEBRIDEMENT EXTREMITY
Anesthesia: General | Site: Foot | Laterality: Left | Wound class: Dirty or Infected

## 2011-12-23 MED ORDER — SODIUM CHLORIDE 0.9 % IJ SOLN: 3.0000 mL | Freq: Two times a day (BID) | INTRAMUSCULAR | Status: DC

## 2011-12-23 MED ORDER — SODIUM CHLORIDE 0.9 % IR SOLN
Status: DC | PRN
  Administered 2011-12-23: 6000 mL

## 2011-12-23 MED ORDER — MOXIFLOXACIN HCL IN NACL 400 MG/250ML IV SOLN
400.0000 mg | INTRAVENOUS | Status: DC
  Administered 2011-12-23 – 2011-12-30 (×7): 400 mg via INTRAVENOUS
  Filled 2011-12-23 (×9): qty 250

## 2011-12-23 MED ORDER — MORPHINE SULFATE 4 MG/ML IJ SOLN
4.0000 mg | Freq: Once | INTRAMUSCULAR | Status: AC
  Administered 2011-12-23: 4 mg via INTRAVENOUS
  Filled 2011-12-23: qty 1

## 2011-12-23 MED ORDER — METRONIDAZOLE IN NACL 5-0.79 MG/ML-% IV SOLN
500.0000 mg | Freq: Three times a day (TID) | INTRAVENOUS | Status: DC
  Administered 2011-12-23 – 2011-12-30 (×20): 500 mg via INTRAVENOUS
  Filled 2011-12-23 (×22): qty 100

## 2011-12-23 MED ORDER — MORPHINE SULFATE 4 MG/ML IJ SOLN
INTRAMUSCULAR | Status: AC
  Filled 2011-12-23: qty 1

## 2011-12-23 MED ORDER — ONDANSETRON HCL 4 MG/2ML IJ SOLN: 4.0000 mg | Freq: Once | INTRAMUSCULAR | Status: AC | PRN

## 2011-12-23 MED ORDER — ENOXAPARIN SODIUM 40 MG/0.4ML ~~LOC~~ SOLN
40.0000 mg | SUBCUTANEOUS | Status: DC
  Administered 2011-12-23 – 2011-12-25 (×3): 40 mg via SUBCUTANEOUS
  Filled 2011-12-23 (×5): qty 0.4

## 2011-12-23 MED ORDER — MORPHINE SULFATE 4 MG/ML IJ SOLN
4.0000 mg | Freq: Once | INTRAMUSCULAR | Status: AC
  Administered 2011-12-23: 4 mg via INTRAVENOUS

## 2011-12-23 MED ORDER — HYDROCODONE-ACETAMINOPHEN 5-325 MG PO TABS
1.0000 | ORAL_TABLET | ORAL | Status: DC | PRN
  Administered 2011-12-24 – 2011-12-31 (×14): 1 via ORAL
  Filled 2011-12-23 (×14): qty 1

## 2011-12-23 MED ORDER — HYDROMORPHONE HCL PF 1 MG/ML IJ SOLN
0.2500 mg | INTRAMUSCULAR | Status: DC | PRN
  Administered 2011-12-23 – 2011-12-24 (×4): 0.5 mg via INTRAVENOUS

## 2011-12-23 MED ORDER — GLIMEPIRIDE 4 MG PO TABS
4.0000 mg | ORAL_TABLET | Freq: Every day | ORAL | Status: DC
  Administered 2011-12-24 – 2011-12-31 (×6): 4 mg via ORAL
  Filled 2011-12-23 (×9): qty 1

## 2011-12-23 MED ORDER — VANCOMYCIN HCL IN DEXTROSE 1-5 GM/200ML-% IV SOLN
1000.0000 mg | Freq: Two times a day (BID) | INTRAVENOUS | Status: DC
  Administered 2011-12-24 – 2011-12-26 (×5): 1000 mg via INTRAVENOUS
  Filled 2011-12-23 (×6): qty 200

## 2011-12-23 MED ORDER — LISINOPRIL 20 MG PO TABS
20.0000 mg | ORAL_TABLET | Freq: Every day | ORAL | Status: DC
  Administered 2011-12-23 – 2011-12-31 (×8): 20 mg via ORAL
  Filled 2011-12-23 (×9): qty 1

## 2011-12-23 MED ORDER — CLOPIDOGREL BISULFATE 75 MG PO TABS
75.0000 mg | ORAL_TABLET | Freq: Every day | ORAL | Status: DC
  Administered 2011-12-24 – 2011-12-31 (×6): 75 mg via ORAL
  Filled 2011-12-23 (×9): qty 1

## 2011-12-23 MED ORDER — ONDANSETRON HCL 4 MG/2ML IJ SOLN
INTRAMUSCULAR | Status: DC | PRN
  Administered 2011-12-23: 4 mg via INTRAVENOUS

## 2011-12-23 MED ORDER — OXYCODONE HCL 5 MG PO TABS
5.0000 mg | ORAL_TABLET | ORAL | Status: DC | PRN
  Administered 2011-12-24 – 2011-12-26 (×6): 5 mg via ORAL
  Filled 2011-12-23 (×6): qty 1

## 2011-12-23 MED ORDER — LEVOFLOXACIN IN D5W 500 MG/100ML IV SOLN
500.0000 mg | INTRAVENOUS | Status: DC
  Administered 2011-12-23: 500 mg via INTRAVENOUS
  Filled 2011-12-23 (×2): qty 100

## 2011-12-23 MED ORDER — SIMVASTATIN 40 MG PO TABS
40.0000 mg | ORAL_TABLET | Freq: Every evening | ORAL | Status: DC
  Administered 2011-12-23 – 2011-12-30 (×7): 40 mg via ORAL
  Filled 2011-12-23 (×9): qty 1

## 2011-12-23 MED ORDER — LACTATED RINGERS IV SOLN
INTRAVENOUS | Status: DC | PRN
  Administered 2011-12-23: 23:00:00 via INTRAVENOUS

## 2011-12-23 MED ORDER — INSULIN ASPART 100 UNIT/ML ~~LOC~~ SOLN
0.0000 [IU] | Freq: Three times a day (TID) | SUBCUTANEOUS | Status: DC
  Administered 2011-12-23 – 2011-12-24 (×2): 4 [IU] via SUBCUTANEOUS
  Administered 2011-12-24: 7 [IU] via SUBCUTANEOUS
  Administered 2011-12-25: 4 [IU] via SUBCUTANEOUS
  Administered 2011-12-25: 3 [IU] via SUBCUTANEOUS
  Administered 2011-12-26: 7 [IU] via SUBCUTANEOUS
  Administered 2011-12-27 (×2): 4 [IU] via SUBCUTANEOUS
  Administered 2011-12-28 (×3): 3 [IU] via SUBCUTANEOUS
  Administered 2011-12-29: 4 [IU] via SUBCUTANEOUS
  Administered 2011-12-29: 7 [IU] via SUBCUTANEOUS
  Administered 2011-12-29 – 2011-12-30 (×2): 4 [IU] via SUBCUTANEOUS
  Administered 2011-12-31: 3 [IU] via SUBCUTANEOUS

## 2011-12-23 MED ORDER — FENTANYL CITRATE 0.05 MG/ML IJ SOLN
INTRAMUSCULAR | Status: DC | PRN
  Administered 2011-12-23 (×3): 50 ug via INTRAVENOUS

## 2011-12-23 MED ORDER — LIDOCAINE HCL (CARDIAC) 20 MG/ML IV SOLN
INTRAVENOUS | Status: DC | PRN
  Administered 2011-12-23: 60 mg via INTRAVENOUS

## 2011-12-23 MED ORDER — INSULIN ASPART 100 UNIT/ML ~~LOC~~ SOLN
0.0000 [IU] | Freq: Every day | SUBCUTANEOUS | Status: DC
  Administered 2011-12-26: 2 [IU] via SUBCUTANEOUS

## 2011-12-23 MED ORDER — CHLORHEXIDINE GLUCONATE 4 % EX LIQD
60.0000 mL | Freq: Once | CUTANEOUS | Status: AC
  Administered 2011-12-23: 4 via TOPICAL
  Filled 2011-12-23: qty 60

## 2011-12-23 MED ORDER — VANCOMYCIN HCL 1000 MG IV SOLR
2000.0000 mg | Freq: Once | INTRAVENOUS | Status: AC
  Administered 2011-12-23: 2000 mg via INTRAVENOUS
  Filled 2011-12-23 (×2): qty 2000

## 2011-12-23 MED ORDER — PROPOFOL 10 MG/ML IV EMUL
INTRAVENOUS | Status: DC | PRN
  Administered 2011-12-23: 120 mg via INTRAVENOUS

## 2011-12-23 MED ORDER — INSULIN GLARGINE 100 UNIT/ML ~~LOC~~ SOLN
25.0000 [IU] | Freq: Every day | SUBCUTANEOUS | Status: DC
  Administered 2011-12-24 – 2011-12-26 (×3): 25 [IU] via SUBCUTANEOUS

## 2011-12-23 MED ORDER — EPHEDRINE SULFATE 50 MG/ML IJ SOLN
INTRAMUSCULAR | Status: DC | PRN
  Administered 2011-12-23: 5 mg via INTRAVENOUS

## 2011-12-23 MED ORDER — SODIUM CHLORIDE 0.9 % IV SOLN
INTRAVENOUS | Status: DC
  Administered 2011-12-23: 12:00:00 via INTRAVENOUS

## 2011-12-23 MED ORDER — SODIUM CHLORIDE 0.9 % IV SOLN
INTRAVENOUS | Status: DC
  Administered 2011-12-24: 05:00:00 via INTRAVENOUS
  Administered 2011-12-25: 50 mL via INTRAVENOUS
  Administered 2011-12-28: 04:00:00 via INTRAVENOUS

## 2011-12-23 MED ORDER — INSULIN ASPART 100 UNIT/ML ~~LOC~~ SOLN
6.0000 [IU] | Freq: Three times a day (TID) | SUBCUTANEOUS | Status: DC
  Administered 2011-12-23 – 2011-12-27 (×8): 6 [IU] via SUBCUTANEOUS

## 2011-12-23 MED ORDER — MORPHINE SULFATE 2 MG/ML IJ SOLN
2.0000 mg | INTRAMUSCULAR | Status: DC | PRN
  Administered 2011-12-24 – 2011-12-31 (×10): 2 mg via INTRAVENOUS
  Filled 2011-12-23 (×11): qty 1

## 2011-12-23 SURGICAL SUPPLY — 49 items
BANDAGE ELASTIC 4 VELCRO ST LF (GAUZE/BANDAGES/DRESSINGS) ×2 IMPLANT
BANDAGE GAUZE ELAST BULKY 4 IN (GAUZE/BANDAGES/DRESSINGS) ×2 IMPLANT
BNDG COHESIVE 4X5 TAN STRL (GAUZE/BANDAGES/DRESSINGS) ×2 IMPLANT
BRUSH SCRUB DISP (MISCELLANEOUS) ×2 IMPLANT
CLOTH BEACON ORANGE TIMEOUT ST (SAFETY) ×2 IMPLANT
COVER SURGICAL LIGHT HANDLE (MISCELLANEOUS) ×2 IMPLANT
CUFF TOURNIQUET SINGLE 18IN (TOURNIQUET CUFF) ×2 IMPLANT
CUFF TOURNIQUET SINGLE 24IN (TOURNIQUET CUFF) IMPLANT
CUFF TOURNIQUET SINGLE 34IN LL (TOURNIQUET CUFF) IMPLANT
CUFF TOURNIQUET SINGLE 44IN (TOURNIQUET CUFF) IMPLANT
DRAPE U-SHAPE 47X51 STRL (DRAPES) ×2 IMPLANT
DRSG ADAPTIC 3X8 NADH LF (GAUZE/BANDAGES/DRESSINGS) ×2 IMPLANT
DRSG PAD ABDOMINAL 8X10 ST (GAUZE/BANDAGES/DRESSINGS) ×4 IMPLANT
DURAPREP 26ML APPLICATOR (WOUND CARE) ×2 IMPLANT
ELECT REM PT RETURN 9FT ADLT (ELECTROSURGICAL)
ELECTRODE REM PT RTRN 9FT ADLT (ELECTROSURGICAL) IMPLANT
FACESHIELD LNG OPTICON STERILE (SAFETY) ×2 IMPLANT
GLOVE BIOGEL PI ORTHO PRO 7.5 (GLOVE) ×1
GLOVE BIOGEL PI ORTHO PRO SZ8 (GLOVE) ×1
GLOVE ORTHO TXT STRL SZ7.5 (GLOVE) ×2 IMPLANT
GLOVE PI ORTHO PRO STRL 7.5 (GLOVE) ×1 IMPLANT
GLOVE PI ORTHO PRO STRL SZ8 (GLOVE) ×1 IMPLANT
GLOVE SURG ORTHO 8.5 STRL (GLOVE) ×2 IMPLANT
GOWN STRL NON-REIN LRG LVL3 (GOWN DISPOSABLE) ×2 IMPLANT
GOWN STRL REIN XL XLG (GOWN DISPOSABLE) ×4 IMPLANT
HANDPIECE INTERPULSE COAX TIP (DISPOSABLE)
KIT BASIN OR (CUSTOM PROCEDURE TRAY) ×2 IMPLANT
KIT ROOM TURNOVER OR (KITS) ×2 IMPLANT
MANIFOLD NEPTUNE II (INSTRUMENTS) ×2 IMPLANT
NS IRRIG 1000ML POUR BTL (IV SOLUTION) ×2 IMPLANT
PACK ORTHO EXTREMITY (CUSTOM PROCEDURE TRAY) ×2 IMPLANT
PAD ARMBOARD 7.5X6 YLW CONV (MISCELLANEOUS) ×4 IMPLANT
PENCIL BUTTON HOLSTER BLD 10FT (ELECTRODE) IMPLANT
SET HNDPC FAN SPRY TIP SCT (DISPOSABLE) IMPLANT
SPONGE GAUZE 4X4 12PLY (GAUZE/BANDAGES/DRESSINGS) ×2 IMPLANT
SPONGE LAP 18X18 X RAY DECT (DISPOSABLE) ×2 IMPLANT
SPONGE LAP 4X18 X RAY DECT (DISPOSABLE) ×2 IMPLANT
STOCKINETTE IMPERVIOUS 9X36 MD (GAUZE/BANDAGES/DRESSINGS) ×2 IMPLANT
SUT ETHILON 2 0 FS 18 (SUTURE) IMPLANT
SUT ETHILON 4 0 FS 1 (SUTURE) IMPLANT
SUT VIC AB 2-0 CT1 27 (SUTURE)
SUT VIC AB 2-0 CT1 TAPERPNT 27 (SUTURE) IMPLANT
TOWEL OR 17X24 6PK STRL BLUE (TOWEL DISPOSABLE) ×2 IMPLANT
TOWEL OR 17X26 10 PK STRL BLUE (TOWEL DISPOSABLE) ×2 IMPLANT
TUBE ANAEROBIC SPECIMEN COL (MISCELLANEOUS) IMPLANT
TUBE CONNECTING 12X1/4 (SUCTIONS) ×2 IMPLANT
UNDERPAD 30X30 INCONTINENT (UNDERPADS AND DIAPERS) ×2 IMPLANT
WATER STERILE IRR 1000ML POUR (IV SOLUTION) ×2 IMPLANT
YANKAUER SUCT BULB TIP NO VENT (SUCTIONS) ×2 IMPLANT

## 2011-12-23 NOTE — Anesthesia Procedure Notes (Signed)
Procedure Name: Intubation Date/Time: 12/23/2011 10:52 PM Performed by: Julianne Rice Z Pre-anesthesia Checklist: Patient identified, Timeout performed, Emergency Drugs available, Suction available and Patient being monitored Patient Re-evaluated:Patient Re-evaluated prior to inductionOxygen Delivery Method: Circle system utilized Preoxygenation: Pre-oxygenation with 100% oxygen Intubation Type: IV induction Ventilation: Mask ventilation without difficulty Laryngoscope Size: Mac and 3 Grade View: Grade I Tube type: Oral Tube size: 7.5 mm Number of attempts: 1 Airway Equipment and Method: Stylet Placement Confirmation: ETT inserted through vocal cords under direct vision,  breath sounds checked- equal and bilateral and positive ETCO2 Secured at: 22 cm Tube secured with: Tape Dental Injury: Teeth and Oropharynx as per pre-operative assessment

## 2011-12-23 NOTE — Anesthesia Postprocedure Evaluation (Signed)
  Anesthesia Post-op Note  Patient: [name]  Procedure(s) Performed: Procedure(s) (LRB): IRRIGATION AND DEBRIDEMENT EXTREMITY (Left)  Patient Location: PACU  Anesthesia Type: General  Level of Consciousness: awake, alert  and oriented  Airway and Oxygen Therapy: Patient Spontanous Breathing and Patient connected to nasal cannula oxygen  Post-op Pain: mild  Post-op Assessment: Post-op Vital signs reviewed  Post-op Vital Signs: Reviewed  Complications: No apparent anesthesia complications

## 2011-12-23 NOTE — ED Notes (Signed)
Onset one day ago left foot tenderness and today headache 0800 throbbing 8/10. EMS reported neuro intact. History of a TIA one weeks ago started on plavix.

## 2011-12-23 NOTE — Brief Op Note (Signed)
12/23/2011  11:42 PM  PATIENT:  [name]  56 y.o. female  PRE-OPERATIVE DIAGNOSIS:  Left Foot Abscess  POST-OPERATIVE DIAGNOSIS:  left foot abscess, deep  PROCEDURE:  Procedure(s) (LRB): IRRIGATION AND DEBRIDEMENT EXTREMITY (Left) deep including muscle and soft tissue  SURGEON:  Surgeon(s) and Role:    * Verlee Rossetti, MD - Primary  PHYSICIAN ASSISTANT:   ASSISTANTS: none   ANESTHESIA:   general  EBL:  Total I/O In: 600 [I.V.:600] Out: -   BLOOD ADMINISTERED:none  DRAINS: none   LOCAL MEDICATIONS USED:  NONE  SPECIMEN:  Source of Specimen:  left foot wound culture  DISPOSITION OF SPECIMEN:  micro  COUNTS:  YES  TOURNIQUET:  * No tourniquets in log *  DICTATION: .Other Dictation: Dictation Number [id number]  PLAN OF CARE: Admit to inpatient   PATIENT DISPOSITION:  PACU - hemodynamically stable.   Delay start of Pharmacological VTE agent (>24hrs) due to surgical blood loss or risk of bleeding: not applicable

## 2011-12-23 NOTE — Progress Notes (Signed)
1600 Patient arrived to floor via stretcher. Alert oriented x4. Left foot swollen and redden wth hard callous are underneath left foot. Left foot elevated on pillows

## 2011-12-23 NOTE — Progress Notes (Addendum)
Disposition Note.    [name], is a 56 y.o. female,   MRN: [id number]  -  DOB - [birth year]  Outpatient Primary MD for the patient is GARBA,LAWAL, MD   Blood pressure 142/63, pulse 90, temperature 98.1 F (36.7 C), temperature source Oral, resp. rate 18, SpO2 97.00%.  Active Problems:  Cellulitis of left lower extremity  DM type 2, uncontrolled, with neuropathy  CVA (cerebral infarction)  Irritable bowel syndrome  Syncope  TIA (transient ischemic attack)    56 yo female (whom I have not seen) with history of CVA, TIA (recently started on plavix), DM, HTN, HLD, CAD, GERD, Recurrent Syncope presented to the ED today complaining of Left Leg pain.  On xray there was no osteo, but she had gas in the soft tissue.  She has had a non healing wound on her left foot for some time and sees a podiatrist for this .  Her WBC is elevated at 22.3 and she has been started on Zosyn in the ED.   I will request a tele bed (given her history) and call unassigned orthopedic surgery [facility]) for a consultation.   Algis Downs, PA-C [facility] Pager: [phone]

## 2011-12-23 NOTE — Progress Notes (Signed)
ANTIBIOTIC CONSULT NOTE - INITIAL  Pharmacy Consult for Vancomycin Indication: Cellulitis of left foot  Allergies  Allergen Reactions  . Banana     Face puffs up  . Penicillins     REACTION: itching, edema  . Strawberry     Face puffs up    Patient Measurements: Height: 5\' 6"  (167.6 cm) Weight: 224 lb 6.4 oz (101.787 kg) IBW/kg (Calculated) : 59.3    Vital Signs: Temp: 98.8 F (37.1 C) (08/06 1531) Temp src: Oral (08/06 1531) BP: 149/64 mmHg (08/06 1531) Pulse Rate: 90  (08/06 1531) Intake/Output from previous day:   Intake/Output from this shift:    Labs:  Basename 12/23/11 1315 12/23/11 1048  WBC 19.8* 22.3*  HGB 11.3* 11.9*  PLT 225 251  LABCREA -- --  CREATININE 0.86 0.90   Estimated Creatinine Clearance: 86.9 ml/min (by C-G formula based on Cr of 0.86). No results found for this basename: VANCOTROUGH:2,VANCOPEAK:2,VANCORANDOM:2,GENTTROUGH:2,GENTPEAK:2,GENTRANDOM:2,TOBRATROUGH:2,TOBRAPEAK:2,TOBRARND:2,AMIKACINPEAK:2,AMIKACINTROU:2,AMIKACIN:2, in the last 72 hours   Microbiology: No results found for this or any previous visit (from the past 720 hour(s)).  Medical History: Past Medical History  Diagnosis Date  . Coronary atherosclerosis of native coronary artery     non obstructive by cath in 2008 and 2010  . Unspecified essential hypertension   . Other and unspecified hyperlipidemia   . Syncope and collapse     near-syncopal episode in November/2008  . Unspecified cardiovascular disease     Decreased left-ventricular fxn  . 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 Unspecified hereditary and idiopathic peripheral neuropathy   . Obesity, unspecified   . Esophageal reflux   . History of diabetes mellitus, type II   . Diaphragmatic hernia without mention of obstruction or gangrene   . Irritable bowel syndrome   . Morbid obesity     Medications:  Scheduled:    . clopidogrel  75 mg Oral Q breakfast  .  enoxaparin (LOVENOX) injection  40 mg Subcutaneous Q24H  . glimepiride  4 mg Oral QAC breakfast  . insulin aspart  0-20 Units Subcutaneous TID WC  . insulin aspart  0-5 Units Subcutaneous QHS  . insulin aspart  6 Units Subcutaneous TID WC  . insulin glargine  25 Units Subcutaneous QHS  . lisinopril  20 mg Oral Daily  . metronidazole  500 mg Intravenous Q8H  .  morphine injection  4 mg Intravenous Once  . moxifloxacin  400 mg Intravenous Q24H  . simvastatin  40 mg Oral QPM  . DISCONTD: levofloxacin (LEVAQUIN) IV  500 mg Intravenous Q24H  . DISCONTD: sodium chloride  3 mL Intravenous Q12H   Assessment: 56 yr old female on IV flagyl, moxifloxacin and vancomycin for cellulitis.    Goal of Therapy:  Vancomycin trough level 10-15 mcg/ml  Plan:  Vancomycin 2 gm iv load x 1 dose, then Vancomycin 1gm iv q12hrs. F/u renal func, culture data  Wendie Simmer, PharmD, BCPS Clinical Pharmacist  Pager: [phone]

## 2011-12-23 NOTE — Anesthesia Preprocedure Evaluation (Signed)
Anesthesia Evaluation  Patient identified by MRN, date of birth, ID band Patient awake    Reviewed: Allergy & Precautions, H&P , NPO status , Patient's Chart, lab work & pertinent test results  Airway Mallampati: I TM Distance: >3 FB     Dental  (+) Teeth Intact and Dental Advisory Given   Pulmonary  breath sounds clear to auscultation        Cardiovascular hypertension, Pt. on medications Rate:Normal     Neuro/Psych    GI/Hepatic   Endo/Other  Type 2, Insulin DependentMorbid obesity  Renal/GU      Musculoskeletal   Abdominal   Peds  Hematology   Anesthesia Other Findings   Reproductive/Obstetrics                           Anesthesia Physical Anesthesia Plan  ASA: III  Anesthesia Plan: General   Post-op Pain Management:    Induction: Intravenous  Airway Management Planned: Oral ETT  Additional Equipment:   Intra-op Plan:   Post-operative Plan: Extubation in OR  Informed Consent: I have reviewed the patients History and Physical, chart, labs and discussed the procedure including the risks, benefits and alternatives for the proposed anesthesia with the patient or authorized representative who has indicated his/her understanding and acceptance.   Dental advisory given  Plan Discussed with: CRNA, Anesthesiologist and Surgeon  Anesthesia Plan Comments:         Anesthesia Quick Evaluation

## 2011-12-23 NOTE — Transfer of Care (Signed)
Immediate Anesthesia Transfer of Care Note  Patient: [name]  Procedure(s) Performed: Procedure(s) (LRB): IRRIGATION AND DEBRIDEMENT EXTREMITY (Left)  Patient Location: PACU  Anesthesia Type: General  Level of Consciousness: awake, alert  and oriented  Airway & Oxygen Therapy: Patient Spontanous Breathing and Patient connected to nasal cannula oxygen  Post-op Assessment: Report given to PACU RN and Post -op Vital signs reviewed and stable  Post vital signs: Reviewed and stable  Complications: No apparent anesthesia complications

## 2011-12-23 NOTE — Consult Note (Signed)
Reason for Consult:Left foot infection  Referring Physician: Dr Lady Gary is an 56 y.o. female.  HPI: Patient reports a one week history of increasing left foot pain and swelling.  Rapid increase in symptoms in last two days. No trauma.  Past Medical History  Diagnosis Date  . Coronary atherosclerosis of native coronary artery     non obstructive by cath in 2008 and 2010  . Unspecified essential hypertension   . Other and unspecified hyperlipidemia   . Syncope and collapse     near-syncopal episode in November/2008  . Unspecified cardiovascular disease     Decreased left-ventricular fxn  . 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 Unspecified hereditary and idiopathic peripheral neuropathy   . Obesity, unspecified   . Esophageal reflux   . History of diabetes mellitus, type II   . Diaphragmatic hernia without mention of obstruction or gangrene   . Irritable bowel syndrome   . Morbid obesity     Past Surgical History  Procedure Date  . Invasive electrophysiologic study 5/09    followed by insertion of an implantable loop recorder. S/p removal   . Colonoscopy   . Cholecystectomy   . Multiple toe surgeries   . Cardiac catheterization     LAD 30%, circumflex 50%, OM 75%, RI 60% with small branch 80%, dominant RCA 60%, EF 45-50%    Family History  Problem Relation Age of Onset  . Pneumonia Mother   . Gallbladder disease Mother     cancer  . Heart failure Mother   . Diabetes Father   . Coronary artery disease Father   . Stroke Neg Hx     Social History:  reports that she has been smoking Cigarettes.  She has a 15 pack-year smoking history. She does not have any smokeless tobacco history on file. She reports that she drinks about 1.2 ounces of alcohol per week. She reports that she does not use illicit drugs.  Allergies:  Allergies  Allergen Reactions  . Banana     Face puffs up  . Penicillins     REACTION:  itching, edema  . Strawberry     Face puffs up    Medications: I have reviewed the patient's current medications.  Results for orders placed during the hospital encounter of 12/23/11 (from the past 48 hour(s))  CBC     Status: Abnormal   Collection Time   12/23/11 10:48 AM      Component Value Range Comment   WBC 22.3 (*) 4.0 - 10.5 K/uL    RBC 4.24  3.87 - 5.11 MIL/uL    Hemoglobin 11.9 (*) 12.0 - 15.0 g/dL    HCT 16.1 (*) 09.6 - 46.0 %    MCV 84.0  78.0 - 100.0 fL    MCH 28.1  26.0 - 34.0 pg    MCHC 33.4  30.0 - 36.0 g/dL    RDW 04.5  40.9 - 81.1 %    Platelets 251  150 - 400 K/uL   BASIC METABOLIC PANEL     Status: Abnormal   Collection Time   12/23/11 10:48 AM      Component Value Range Comment   Sodium 137  135 - 145 mEq/L    Potassium 3.5  3.5 - 5.1 mEq/L    Chloride 99  96 - 112 mEq/L    CO2 25  19 - 32 mEq/L    Glucose, Bld 193 (*)  70 - 99 mg/dL    BUN 19  6 - 23 mg/dL    Creatinine, Ser 1.61  0.50 - 1.10 mg/dL    Calcium 9.0  8.4 - 09.6 mg/dL    GFR calc non Af Amer 70 (*) >90 mL/min    GFR calc Af Amer 81 (*) >90 mL/min   CBC     Status: Abnormal   Collection Time   12/23/11  1:15 PM      Component Value Range Comment   WBC 19.8 (*) 4.0 - 10.5 K/uL    RBC 4.02  3.87 - 5.11 MIL/uL    Hemoglobin 11.3 (*) 12.0 - 15.0 g/dL    HCT 04.5 (*) 40.9 - 46.0 %    MCV 83.6  78.0 - 100.0 fL    MCH 28.1  26.0 - 34.0 pg    MCHC 33.6  30.0 - 36.0 g/dL    RDW 81.1  91.4 - 78.2 %    Platelets 225  150 - 400 K/uL   CREATININE, SERUM     Status: Abnormal   Collection Time   12/23/11  1:15 PM      Component Value Range Comment   Creatinine, Ser 0.86  0.50 - 1.10 mg/dL    GFR calc non Af Amer 74 (*) >90 mL/min    GFR calc Af Amer 85 (*) >90 mL/min   CBC     Status: Abnormal   Collection Time   12/23/11  4:07 PM      Component Value Range Comment   WBC 18.3 (*) 4.0 - 10.5 K/uL    RBC 4.06  3.87 - 5.11 MIL/uL    Hemoglobin 11.4 (*) 12.0 - 15.0 g/dL    HCT 95.6 (*) 21.3 - 46.0 %      MCV 83.7  78.0 - 100.0 fL    MCH 28.1  26.0 - 34.0 pg    MCHC 33.5  30.0 - 36.0 g/dL    RDW 08.6  57.8 - 46.9 %    Platelets 227  150 - 400 K/uL   GLUCOSE, CAPILLARY     Status: Abnormal   Collection Time   12/23/11  4:43 PM      Component Value Range Comment   Glucose-Capillary 154 (*) 70 - 99 mg/dL    Comment 1 Documented in Chart      Comment 2 Notify RN       Ct Head Wo Contrast  12/23/2011  *RADIOLOGY REPORT*  Clinical Data: Right frontal headaches  CT HEAD WITHOUT CONTRAST  Technique:  Contiguous axial images were obtained from the base of the skull through the vertex without contrast.  Comparison: July 01, 2011  Findings: The ventricles are normal in size, shape, and position. There is no mass effect or midline shift.  No acute hemorrhage or abnormal extra-axial fluid collections are identified.  The gray/white differentiation is normal.  The orbits, calvarium, visualized paranasal sinuses have a normal appearance.  IMPRESSION: Normal CT scan of the head with no evidence of acute intracranial abnormality.  Original Report Authenticated By: Brandon Melnick, M.D.   Dg Foot Complete Left  12/23/2011  *RADIOLOGY REPORT*  Clinical Data: Ulcer left foot.  LEFT FOOT - COMPLETE 3+ VIEW  Comparison: 09/04/2011.  Findings: There is soft tissue gas at the base of the left great toe and extending between the first and second metatarsals compatible with infection.  No radiographic changes of osteomyelitis at this time.  Old deformity and destruction of  the left fifth phalanges, stable.  IMPRESSION: Extensive soft tissue gas in the base of the left great toe and extending between the first and second metatarsals compatible with soft tissue infection.  No radiographic changes of acute osteomyelitis.  Original Report Authenticated By: Cyndie Chime, M.D.    ROS Blood pressure 149/64, pulse 90, temperature 98.8 F (37.1 C), temperature source Oral, resp. rate 20, height 5\' 6"  (1.676 m), weight  101.787 kg (224 lb 6.4 oz), SpO2 99.00%. Physical Exam  Patient in NAD with normal appearing right foot and ankle. No swelling. Left foot is swollen and erythematous with bullous bloody blister between great toe and second toe. Foul smell noted. Tender around the affected area.  Assessment/Plan: Left foot abscess with clinical worsening Plan emergent I+D.  Discussed with the patient who agrees with the plan.  [name],STEVEN R 12/23/2011, 6:01 PM

## 2011-12-23 NOTE — ED Provider Notes (Signed)
History     CSN: [id number]  Arrival date & time 12/23/11  1013   First MD Initiated Contact with Patient 12/23/11 1018      Chief Complaint  Patient presents with  . Foot Pain  . Headache     HPI Pt complains of pain in her left leg that started yesterday.  Today she noticed a headache.  Pt recently had a TIA and was started on plavix.  No recent head injury or fall.  No fever.  NO vomiting or diarrhea.  She has had some nausea.  NO fall or injury regarding her leg.  No redness or swelling.  The pain is located in the foot.  It increases with walking.  Pt has history of a diabetic foot ulcer.  She has been seeing her podiatrist and had a callus trimmed but that was a while ago.  It has never completely healed.  Past Medical History  Diagnosis Date  . Coronary atherosclerosis of native coronary artery     non obstructive by cath in 2008 and 2010  . Unspecified essential hypertension   . Other and unspecified hyperlipidemia   . Syncope and collapse     near-syncopal episode in November/2008  . Unspecified cardiovascular disease     Decreased left-ventricular fxn  . 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 Unspecified hereditary and idiopathic peripheral neuropathy   . Obesity, unspecified   . Esophageal reflux   . History of diabetes mellitus, type II   . Diaphragmatic hernia without mention of obstruction or gangrene   . Irritable bowel syndrome   . Morbid obesity     Past Surgical History  Procedure Date  . Invasive electrophysiologic study 5/09    followed by insertion of an implantable loop recorder. S/p removal   . Colonoscopy   . Cholecystectomy   . Multiple toe surgeries   . Cardiac catheterization     LAD 30%, circumflex 50%, OM 75%, RI 60% with small branch 80%, dominant RCA 60%, EF 45-50%    Family History  Problem Relation Age of Onset  . Pneumonia Mother   . Gallbladder disease Mother     cancer  . Heart  failure Mother   . Diabetes Father   . Coronary artery disease Father   . Stroke Neg Hx     History  Substance Use Topics  . Smoking status: Current Everyday Smoker -- 0.5 packs/day for 30 years    Types: Cigarettes  . Smokeless tobacco: Not on file  . Alcohol Use: 1.2 oz/week    2 Glasses of wine per week    OB History    Grav Para Term Preterm Abortions TAB SAB Ect Mult Living                  Review of Systems  Constitutional: Negative for fever.  All other systems reviewed and are negative.    Allergies  Banana; Penicillins; and Strawberry  Home Medications   Current Outpatient Rx  Name Route Sig Dispense Refill  . ACETAMINOPHEN 325 MG PO TABS Oral Take 650 mg by mouth every 6 (six) hours as needed. pain    . CLOPIDOGREL BISULFATE 75 MG PO TABS Oral Take 1 tablet (75 mg total) by mouth daily with breakfast. 30 tablet 3  . GLIMEPIRIDE 4 MG PO TABS Oral Take 4 mg by mouth daily before breakfast.      . INSULIN ASPART 100 UNIT/ML [place] SOLN  Subcutaneous Inject 6 Units into the skin 3 (three) times daily with meals. 1 vial 3    Please also provide insulin syringes # 120 with PR ...  . INSULIN GLARGINE 100 UNIT/ML [place] SOLN Subcutaneous Inject 20 Units into the skin at bedtime.    Marland Kitchen LISINOPRIL 20 MG PO TABS Oral Take 20 mg by mouth daily.    Marland Kitchen METFORMIN HCL 500 MG PO TABS Oral Take 500 mg by mouth 2 (two) times daily with a meal.    . SIMVASTATIN 40 MG PO TABS Oral Take 40 mg by mouth every evening.      BP 149/91  Pulse 98  Temp 98.1 F (36.7 C) (Oral)  Resp 20  SpO2 97%  Physical Exam  Nursing note and vitals reviewed. Constitutional: She appears well-developed and well-nourished. No distress.  HENT:  Head: Normocephalic and atraumatic.  Right Ear: External ear normal.  Left Ear: External ear normal.  Eyes: Conjunctivae are normal. Right eye exhibits no discharge. Left eye exhibits no discharge. No scleral icterus.  Neck: Neck supple. No tracheal deviation  present.  Cardiovascular: Normal rate, regular rhythm and intact distal pulses.   Pulmonary/Chest: Effort normal and breath sounds normal. No stridor. No respiratory distress. She has no wheezes. She has no rales.  Abdominal: Soft. Bowel sounds are normal. She exhibits no distension. There is no tenderness. There is no rebound and no guarding.  Musculoskeletal: She exhibits edema and tenderness.       Black blister lesion noted around left toe, ulceration and callus at base of first mt, erythema of foot with lymphangitic streaking towards knee  Neurological: She is alert. She has normal strength. No sensory deficit. Cranial nerve deficit:  no gross defecits noted. She exhibits normal muscle tone. She displays no seizure activity. Coordination normal.  Skin: Skin is warm and dry. No rash noted.  Psychiatric: She has a normal mood and affect.    ED Course  Procedures (including critical care time)  Labs Reviewed  CBC - Abnormal; Notable for the following:    WBC 22.3 (*)     Hemoglobin 11.9 (*)     HCT 35.6 (*)     All other components within normal limits  BASIC METABOLIC PANEL - Abnormal; Notable for the following:    Glucose, Bld 193 (*)     GFR calc non Af Amer 70 (*)     GFR calc Af Amer 81 (*)     All other components within normal limits   Ct Head Wo Contrast  12/23/2011  *RADIOLOGY REPORT*  Clinical Data: Right frontal headaches  CT HEAD WITHOUT CONTRAST  Technique:  Contiguous axial images were obtained from the base of the skull through the vertex without contrast.  Comparison: July 01, 2011  Findings: The ventricles are normal in size, shape, and position. There is no mass effect or midline shift.  No acute hemorrhage or abnormal extra-axial fluid collections are identified.  The gray/white differentiation is normal.  The orbits, calvarium, visualized paranasal sinuses have a normal appearance.  IMPRESSION: Normal CT scan of the head with no evidence of acute intracranial  abnormality.  Original Report Authenticated By: Brandon Melnick, M.D.   Dg Foot Complete Left  12/23/2011  *RADIOLOGY REPORT*  Clinical Data: Ulcer left foot.  LEFT FOOT - COMPLETE 3+ VIEW  Comparison: 09/04/2011.  Findings: There is soft tissue gas at the base of the left great toe and extending between the first and second metatarsals compatible with infection.  No radiographic changes of osteomyelitis at this time.  Old deformity and destruction of the left fifth phalanges, stable.  IMPRESSION: Extensive soft tissue gas in the base of the left great toe and extending between the first and second metatarsals compatible with soft tissue infection.  No radiographic changes of acute osteomyelitis.  Original Report Authenticated By: Cyndie Chime, M.D.     1. Cellulitis       MDM  Pt with signs of soft tissue infection, cellulitis.  No crepitus to suggest deep space infection.  Will admit to the hospital for IV antibiotics.  No signs of acute vascular compromise at this time.        Celene Kras, MD 12/23/11 [phone]

## 2011-12-23 NOTE — Progress Notes (Signed)
1830 Left leg and foot washed with Hibiclens solution.

## 2011-12-23 NOTE — Progress Notes (Signed)
Patient [name]. Mohs, 56 year old [place] American female resident of [name] is struggling with orthopedic challenges.  Patient thanked Orthoptist for providing pastoral presence and conversation.  I will follow up as needed.

## 2011-12-23 NOTE — H&P (Addendum)
Hospital Admission Note Date: 12/23/2011  Patient name: [name] Medical record number: [id number] Date of birth: October 29, 1955 Age: 56 y.o. Gender: female PCP: [name], MD  Attending physician: Marthann Schiller, MD  Chief Complaint: Left foot pain  History of Present Illness: Patient is well-known to me from a hospitalization last week when she had a CVA. She presents today with redness and swelling of the left foot. The patient states that the callus under her great toe was essentially unremarkable as noted last week. She states that she went home and then went to work on Monday and Tuesday. Her last 48 hours she noted that she was having more pain at the area of the callus and then noted an area of darkness at the dorsal surface of her foot as well as increasing warmth erythema and swelling. She denies any systemic fevers. Thus she came to the emergency room for further evaluation and management. The patient denies any known trauma to her foot but does have some loss of sensation on the plantar surface of both of her feet secondary to diabetic neuropathy. In the emergency room the patient was found to have a markedly elevated white blood cell count 22.3, as well as findings consistent with possible diabetic foot abscess. Breasts admit the patient for further management.  Scheduled Meds:   . enoxaparin (LOVENOX) injection  40 mg Subcutaneous Q24H  . levofloxacin (LEVAQUIN) IV  500 mg Intravenous Q24H  .  morphine injection  4 mg Intravenous Once  . sodium chloride  3 mL Intravenous Q12H   Continuous Infusions:   . sodium chloride 125 mL/hr at 12/23/11 1133   PRN Meds:. Allergies: Banana; Penicillins; and Strawberry Past Medical History  Diagnosis Date  . Coronary atherosclerosis of native coronary artery     non obstructive by cath in 2008 and 2010  . Unspecified essential hypertension   . Other and unspecified hyperlipidemia   . Syncope and collapse     near-syncopal  episode in November/2008  . Unspecified cardiovascular disease     Decreased left-ventricular fxn  . 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 Unspecified hereditary and idiopathic peripheral neuropathy   . Obesity, unspecified   . Esophageal reflux   . History of diabetes mellitus, type II   . Diaphragmatic hernia without mention of obstruction or gangrene   . Irritable bowel syndrome   . Morbid obesity    Past Surgical History  Procedure Date  . Invasive electrophysiologic study 5/09    followed by insertion of an implantable loop recorder. S/p removal   . Colonoscopy   . Cholecystectomy   . Multiple toe surgeries   . Cardiac catheterization     LAD 30%, circumflex 50%, OM 75%, RI 60% with small branch 80%, dominant RCA 60%, EF 45-50%   Family History  Problem Relation Age of Onset  . Pneumonia Mother   . Gallbladder disease Mother     cancer  . Heart failure Mother   . Diabetes Father   . Coronary artery disease Father   . Stroke Neg Hx    History   Social History  . Marital Status: Single    Spouse Name: N/A    Number of Children: N/A  . Years of Education: N/A   Occupational History  . Document [employer] company   Social History Main Topics  . Smoking status: Current Everyday Smoker -- 0.5 packs/day for 30 years  Types: Cigarettes  . Smokeless tobacco: Not on file  . Alcohol Use: 1.2 oz/week    2 Glasses of wine per week  . Drug Use: No  . Sexually Active: Not on file   Other Topics Concern  . Not on file   Social History Narrative   She drinks beer. Several on sundays. Does not routinely exercise. 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    Review of Systems: A comprehensive review of systems was negative. Physical Exam: No intake or output data in the 24 hours ending 12/23/11 1502 General: Alert, awake, oriented x3, in no acute distress.  HEENT: [place]/AT PEERL,  EOMI Neck: Trachea midline,  no masses, no thyromegal,y no JVD, no carotid bruit OROPHARYNX:  Moist, No exudate/ erythema/lesions.  Heart: Regular rate and rhythm.  Lungs: Clear to auscultation.  Neuro: No focal neurological deficits. Musculoskeletal: No warm swelling or erythema around joints, no spinal tenderness noted. Skin: Patient has warmth erythema and swelling of the dorsal surface of the left foot. She also has an area that appears as a blood blister in the web space between the first and second digits of the left lower extremity. On the plantar surface at the first metatarsal the patient had what appears to be an abscess formation appears ready to rupture. Lab results:  Basename 12/23/11 1315 12/23/11 1048  NA -- 137  K -- 3.5  CL -- 99  CO2 -- 25  GLUCOSE -- 193*  BUN -- 19  CREATININE 0.86 0.90  CALCIUM -- 9.0  MG -- --  PHOS -- --   No results found for this basename: AST:2,ALT:2,ALKPHOS:2,BILITOT:2,PROT:2,ALBUMIN:2 in the last 72 hours No results found for this basename: LIPASE:2,AMYLASE:2 in the last 72 hours  Basename 12/23/11 1315 12/23/11 1048  WBC 19.8* 22.3*  NEUTROABS -- --  HGB 11.3* 11.9*  HCT 33.6* 35.6*  MCV 83.6 84.0  PLT 225 251   No results found for this basename: CKTOTAL:3,CKMB:3,CKMBINDEX:3,TROPONINI:3 in the last 72 hours No components found with this basename: POCBNP:3 No results found for this basename: DDIMER:2 in the last 72 hours No results found for this basename: HGBA1C:2 in the last 72 hours No results found for this basename: CHOL:2,HDL:2,LDLCALC:2,TRIG:2,CHOLHDL:2,LDLDIRECT:2 in the last 72 hours No results found for this basename: TSH,T4TOTAL,FREET3,T3FREE,THYROIDAB in the last 72 hours No results found for this basename: VITAMINB12:2,FOLATE:2,FERRITIN:2,TIBC:2,IRON:2,RETICCTPCT:2 in the last 72 hours Imaging results:  Dg Chest 2 View  12/15/2011  *RADIOLOGY REPORT*  Clinical Data: Dizziness  CHEST - 2 VIEW  Comparison: 11/26/2011   Findings: Normal heart size.  Under aerated and grossly clear lungs.  No pneumothorax.  No pleural effusions.  Stable thoracic spine.  IMPRESSION: No active cardiopulmonary disease.  Original Report Authenticated By: Donavan Burnet, M.D.   Dg Chest 2 View  11/26/2011  *RADIOLOGY REPORT*  Clinical Data: Shortness of breath and weakness.  CHEST - 2 VIEW  Comparison: PA and lateral chest 05/29/2011.  Findings: Lungs are clear.  Heart size is normal.  No pneumothorax or pleural fluid.  IMPRESSION: Negative chest.  Original Report Authenticated By: Bernadene Bell. Maricela Curet, M.D.   Ct Head Wo Contrast  12/23/2011  *RADIOLOGY REPORT*  Clinical Data: Right frontal headaches  CT HEAD WITHOUT CONTRAST  Technique:  Contiguous axial images were obtained from the base of the skull through the vertex without contrast.  Comparison: July 01, 2011  Findings: The ventricles are normal in size, shape, and position. There is no mass effect or midline shift.  No acute hemorrhage or abnormal extra-axial fluid collections are identified.  The gray/white differentiation is normal.  The orbits, calvarium, visualized paranasal sinuses have a normal appearance.  IMPRESSION: Normal CT scan of the head with no evidence of acute intracranial abnormality.  Original Report Authenticated By: Brandon Melnick, M.D.   Ct Head Wo Contrast  12/15/2011  *RADIOLOGY REPORT*  Clinical Data: Dizziness.  Nausea.  Headache.  Left arm numbness.  CT HEAD WITHOUT CONTRAST  Technique:  Contiguous axial images were obtained from the base of the skull through the vertex without contrast.  Comparison: 07/02/2011  Findings: The brain shows generalized atrophy.  No sign of old or acute focal infarction, mass lesion, hemorrhage, hydrocephalus or extra-axial collection.  The calvarium is unremarkable.  Sinuses, middle ears and mastoids are clear.  IMPRESSION: Negative head CT.  Mild generalized atrophy.  No focal or acute finding.  Original Report Authenticated By:  Thomasenia Sales, M.D.   Mri Brain Without Contrast  12/16/2011  *RADIOLOGY REPORT*  Clinical Data:  Left sided numbness.  Facial droop  MRI HEAD WITHOUT CONTRAST MRA HEAD WITHOUT CONTRAST  Technique:  Multiplanar, multiecho pulse sequences of the brain and surrounding structures were obtained without intravenous contrast. Angiographic images of the head were obtained using MRA technique without contrast.  Comparison:  CT 12/15/2011  MRI HEAD  Findings:  Sub centimeter acute infarct in the right frontal white matter.  No other areas of acute infarct.  Mild chronic microvascular ischemic change in the white matter. Ventricle size is normal.  Negative for hemorrhage or mass. Paranasal sinuses are clear.  IMPRESSION: Acute infarct in the right frontal white matter adjacent to the frontal horn of the lateral ventricle.  MRA HEAD  Findings: Left vertebral artery is dominant.  Small right vertebral artery with a moderate stenosis distally.  Mild irregularity the basilar due to atherosclerotic disease.  PICA is patent bilaterally.  Superior cerebellar and posterior cerebral arteries are patent.  Fetal origin of the posterior cerebral artery bilaterally.  Basilar artery ends in the superior cerebellar arteries.  Internal carotid artery is patent bilaterally with mild atherosclerotic disease in the cavernous segment.  Anterior and middle cerebral arteries are patent bilaterally.  Mild irregularity of the middle cerebral artery branches bilaterally suggesting atherosclerotic disease.  Negative for cerebral aneurysm.  IMPRESSION: Mild to moderate intracranial atherosclerotic disease without large vessel occlusion.  Original Report Authenticated By: Camelia Phenes, M.D.   Dg Foot Complete Left  12/23/2011  *RADIOLOGY REPORT*  Clinical Data: Ulcer left foot.  LEFT FOOT - COMPLETE 3+ VIEW  Comparison: 09/04/2011.  Findings: There is soft tissue gas at the base of the left great toe and extending between the first and second  metatarsals compatible with infection.  No radiographic changes of osteomyelitis at this time.  Old deformity and destruction of the left fifth phalanges, stable.  IMPRESSION: Extensive soft tissue gas in the base of the left great toe and extending between the first and second metatarsals compatible with soft tissue infection.  No radiographic changes of acute osteomyelitis.  Original Report Authenticated By: Cyndie Chime, M.D.   Mr Mra Head/brain Wo Cm  12/16/2011  *RADIOLOGY REPORT*  Clinical Data:  Left sided numbness.  Facial droop  MRI HEAD WITHOUT CONTRAST MRA HEAD WITHOUT CONTRAST  Technique:  Multiplanar, multiecho pulse sequences of the brain and surrounding structures were obtained without intravenous contrast. Angiographic images of the head were obtained using MRA technique without contrast.  Comparison:  CT 12/15/2011  MRI HEAD  Findings:  Sub centimeter acute infarct in the right frontal white matter.  No other areas of acute infarct.  Mild chronic microvascular ischemic change in the white matter. Ventricle size is normal.  Negative for hemorrhage or mass. Paranasal sinuses are clear.  IMPRESSION: Acute infarct in the right frontal white matter adjacent to the frontal horn of the lateral ventricle.  MRA HEAD  Findings: Left vertebral artery is dominant.  Small right vertebral artery with a moderate stenosis distally.  Mild irregularity the basilar due to atherosclerotic disease.  PICA is patent bilaterally.  Superior cerebellar and posterior cerebral arteries are patent.  Fetal origin of the posterior cerebral artery bilaterally.  Basilar artery ends in the superior cerebellar arteries.  Internal carotid artery is patent bilaterally with mild atherosclerotic disease in the cavernous segment.  Anterior and middle cerebral arteries are patent bilaterally.  Mild irregularity of the middle cerebral artery branches bilaterally suggesting atherosclerotic disease.  Negative for cerebral aneurysm.   IMPRESSION: Mild to moderate intracranial atherosclerotic disease without large vessel occlusion.  Original Report Authenticated By: Camelia Phenes, M.D.   Other results:    Patient Active Hospital Problem List: Cellulitis of left lower extremity (12/23/2011)   Assessment: Patient presents with cellulitis of the left lower extremity what is likely a diabetic foot ulcer. I've counseled orthopedic surgery as I suspect that the patient likely has a need for urgent incision and drainage of the wound. At this time will hold off from obtaining an MRI and have the patient seen by orthopedic surgery as I suspect that they will have to perform a surgical procedure and this can make the diagnosis of osteomyelitis was on an MRI.    Plan: In the meantime the patient will continue on IV antibiotics. The patient has an allergy to penicillin which she describes as having her airway swelling. I will place the patient on IV clindamycin, IV Flagyl and vancomycin.  DM type 2, uncontrolled, with neuropathy (05/30/2008)   Assessment: I. will hold patient's Glucophage and will treat her with Lantus, meal coverage and correction dose insulin.    CVA (cerebral infarction) (05/30/2008)   Assessment: Patient had a recent CVA of only one week ago. She is currently on Plavix and will continue on her Plavix as it is imperative that this recent attack or stroke she continues on her antiplatelet agents    DVT prophylaxis with Lovenox.  [name],[name] 12/23/2011, 3:02 PM

## 2011-12-23 NOTE — ED Notes (Signed)
Report called to Pat RN

## 2011-12-24 ENCOUNTER — Encounter (HOSPITAL_COMMUNITY): Payer: Self-pay | Admitting: Orthopedic Surgery

## 2011-12-24 DIAGNOSIS — L02419 Cutaneous abscess of limb, unspecified: Secondary | ICD-10-CM

## 2011-12-24 DIAGNOSIS — E1169 Type 2 diabetes mellitus with other specified complication: Secondary | ICD-10-CM

## 2011-12-24 DIAGNOSIS — E11621 Type 2 diabetes mellitus with foot ulcer: Secondary | ICD-10-CM | POA: Diagnosis present

## 2011-12-24 DIAGNOSIS — L97509 Non-pressure chronic ulcer of other part of unspecified foot with unspecified severity: Secondary | ICD-10-CM

## 2011-12-24 LAB — DIFFERENTIAL
Basophils Absolute: 0 10*3/uL (ref 0.0–0.1)
Basophils Relative: 0 % (ref 0–1)
Eosinophils Relative: 0 % (ref 0–5)
Lymphocytes Relative: 11 % — ABNORMAL LOW (ref 12–46)

## 2011-12-24 LAB — CBC
MCV: 85.2 fL (ref 78.0–100.0)
Platelets: 206 10*3/uL (ref 150–400)
RDW: 14.7 % (ref 11.5–15.5)
WBC: 15.7 10*3/uL — ABNORMAL HIGH (ref 4.0–10.5)

## 2011-12-24 LAB — GLUCOSE, CAPILLARY
Glucose-Capillary: 211 mg/dL — ABNORMAL HIGH (ref 70–99)
Glucose-Capillary: 151 mg/dL — ABNORMAL HIGH (ref 70–99)

## 2011-12-24 MED ORDER — HYDROMORPHONE HCL PF 1 MG/ML IJ SOLN
INTRAMUSCULAR | Status: AC
  Filled 2011-12-24: qty 1

## 2011-12-24 NOTE — Progress Notes (Signed)
Physical Therapy Evaluation Patient Details Name: [name] MRN: [id number] DOB: 1955-06-10 Today's Date: 12/24/2011 Time: 4098-1191 PT Time Calculation (min): 24 min  PT Assessment / Plan / Recommendation Clinical Impression  56 yo female admitted with L foot abscess, now s/p I & D L foot presents with decr functional mobility; Will benefit from acute PT to maximize independence and safety with mobility, transfers, amb to enable safe dc home with prn family assist;  Of note, left message with Alphonsa Overall, PA for Dr. Ranell Patrick re: getting Wbing status of [name]'s Left foot clarified    PT Assessment  Patient needs continued PT services    Follow Up Recommendations  Home health PT;Supervision/Assistance - 24 hour    Barriers to Discharge        Equipment Recommendations  3 in 1 bedside comode (possibly wheelchair if pt is NWB LLE)    Recommendations for Other Services OT consult   Frequency Min 5X/week    Precautions / Restrictions Precautions Precautions: Fall Required Braces or Orthoses:  (Await clarification re: use of Darco or post-op shoe) Restrictions Weight Bearing Restrictions: Yes LLE Weight Bearing:  (Have requested definitive clarification of WBing status; Left message with PA)   Pertinent Vitals/Pain 9/10 Left foot initially; RN gave IV pain meds, and pain decr to 6/10 by the end of session      Mobility  Bed Mobility Bed Mobility: Supine to Sit;Sitting - Scoot to Edge of Bed Supine to Sit: 5: Supervision;With rails Sitting - Scoot to Edge of Bed: 5: Supervision;With rail Details for Bed Mobility Assistance: Somewhat inefficient movement, but did not require physical assist Transfers Transfers: Sit to Stand;Stand to Sit Sit to Stand: 4: Min assist;From bed;With upper extremity assist Stand to Sit: 4: Min assist;To chair/3-in-1 Details for Transfer Assistance: cues for safety and hand placement; Cues also to try to keep Left foot NWB (which she was able  to do); Very uncontrolled descent with stand to sit Ambulation/Gait Ambulation/Gait Assistance: 4: Min assist Ambulation Distance (Feet): 3 Feet (be to recliner) Assistive device: Rolling walker Ambulation/Gait Assistance Details: Pt performed heel-toe pivot-like steps on Right foot with UE support on RW; Able to maintain NWB L foot, but quite fatigued once she got to the chair    Exercises     PT Diagnosis: Difficulty walking;Acute pain  PT Problem List: Decreased strength;Decreased activity tolerance;Decreased balance;Decreased mobility;Decreased knowledge of use of DME;Decreased knowledge of precautions;Pain PT Treatment Interventions: DME instruction;Gait training;Stair training;Functional mobility training;Therapeutic activities;Therapeutic exercise;Patient/family education;Balance training   PT Goals Acute Rehab PT Goals PT Goal Formulation: With patient Time For Goal Achievement: 01/07/12 Potential to Achieve Goals: Good Pt will go Supine/Side to Sit: with modified independence PT Goal: Supine/Side to Sit - Progress: Goal set today Pt will go Sit to Supine/Side: with modified independence PT Goal: Sit to Supine/Side - Progress: Goal set today Pt will go Sit to Stand: with supervision PT Goal: Sit to Stand - Progress: Goal set today Pt will go Stand to Sit: with supervision PT Goal: Stand to Sit - Progress: Goal set today Pt will Ambulate: 51 - 150 feet;with supervision;with rolling walker PT Goal: Ambulate - Progress: Goal set today Pt will Go Up / Down Stairs: 1-2 stairs;with min assist;with rolling walker PT Goal: Up/Down Stairs - Progress: Goal set today  Visit Information  Last PT Received On: 12/24/11 Assistance Needed: +1    Subjective Data  Subjective: Agreeable to OOB Patient Stated Goal: home   Prior Functioning  Home  Living Lives With: Alone Available Help at Discharge: Family;Available 24 hours/day Type of Home: House Home Access: Stairs to  enter [employer] of Steps: 1 Home Layout: One level Bathroom Shower/Tub: Tub/shower unit;[employer]: Standard Home Adaptive Equipment: Walker - rolling Prior Function Level of Independence: Independent Able to Take Stairs?: Yes Driving: Yes Vocation: Full time employment Communication Communication: No difficulties Dominant Hand: Right    Cognition  Overall Cognitive Status: Appears within functional limits for tasks assessed/performed Arousal/Alertness: Awake/alert Orientation Level: Appears intact for tasks assessed Behavior During Session: [facility] for tasks performed    Extremity/Trunk Assessment Right Upper Extremity Assessment RUE ROM/Strength/Tone: [facility] for simple transfer tasks assessed; If pt is NWB LLE, she may not have tohe upper body strength to maintain NWBing for amb  Left Upper Extremity Assessment LUE ROM/Strength/Tone: WFL for tasks assessed Right Lower Extremity Assessment RLE ROM/Strength/Tone: [facility] for tasks assessed Left Lower Extremity Assessment LLE ROM/Strength/Tone: Deficits;Due to pain LLE ROM/Strength/Tone Deficits: Proceeded conservatively with NWBing LLE until otherwise advised by Ortho Trunk Assessment Trunk Assessment: Normal   Balance    End of Session PT - End of Session Equipment Utilized During Treatment: Gait belt Activity Tolerance: Patient limited by fatigue Patient left: in chair;with call bell/phone within reach Nurse Communication: Mobility status  GP     Olen Pel [place], [place] [phone]  12/24/2011, 12:27 PM

## 2011-12-24 NOTE — Progress Notes (Signed)
Orthopedics Progress Note  Subjective: Pt sitting comfortably c/o mild to moderate pain to left foot today Pain control is adequate  Objective:  Filed Vitals:   12/24/11 1403  BP: 121/70  Pulse: 93  Temp: 98.4 F (36.9 C)  Resp: 20    General: Awake and alert  Musculoskeletal: left foot dressing intact nv intact in other toes Neurovascularly intact  Lab Results  Component Value Date   WBC 15.7* 12/24/2011   HGB 9.9* 12/24/2011   HCT 30.6* 12/24/2011   MCV 85.2 12/24/2011   PLT 206 12/24/2011       Component Value Date/Time   NA 137 12/23/2011 1048   K 3.5 12/23/2011 1048   CL 99 12/23/2011 1048   CO2 25 12/23/2011 1048   GLUCOSE 193* 12/23/2011 1048   BUN 19 12/23/2011 1048   CREATININE 0.86 12/23/2011 1315   CALCIUM 9.0 12/23/2011 1048   GFRNONAA 74* 12/23/2011 1315   GFRAA 85* 12/23/2011 1315    Lab Results  Component Value Date   INR 0.94 12/15/2011   INR 1.0 11/03/2008   INR 0.9 11/02/2008    Assessment/Plan: POD #1 s/p Procedure(s):left  IRRIGATION AND DEBRIDEMENT EXTREMITY  Heel weight bearing is okay, will order formerly Continue Iv antibiotics Will continue to monitor progress Dressing change tomorrow  Viviann Spare R. Ranell Patrick, MD 12/24/2011 3:25 PM

## 2011-12-24 NOTE — Progress Notes (Signed)
[facility] PROGRESS NOTE  [name] VOZ:[id number] DOB: 29-Aug-1955 DOA: 12/23/2011 PCP: [name], MD  Assessment/Plan:  Diabetic foot ulcer Active Problems:  CVA (cerebral infarction)  Irritable bowel syndrome  DM type 2, uncontrolled, with neuropathy  Syncope  TIA (transient ischemic attack)  Cellulitis of left lower extremity  1. Diabetic foot ulcer: Patient is status post I&D of diabetic foot ulcer left foot. Per Dr. Suzi Roots orthopedic surgery the abscess was more extensive than it appeared on the surface. She will likely have to have further surgical debridement. At this time I will continue with the vancomycin, Avelox and metronidazole. 2. Diabetes type 2: Patient's blood sugars are currently well controlled we'll continue. 3. Recent CVA: Patient will continue on her Plavix on a daily basis. 4. Hyperlipidemia: Patient will continue on her simvastatin 5. Hypertension: Continue Prinivil.  Code Status: Full code Family Communication: Not applicable Disposition Plan: Not yet determined  [name] A.  [facility] Pager [phone]. If 8PM-8AM, please contact night-coverage at [email or web address], password [facility] 12/24/2011, 8:37 PM  LOS: 1 day   Brief narrative: This is a patient with uncontrolled diabetes type 2 who presented to the emergency room with what appeared to be cellulitis of the left lower extremity. It turns out that this was an extensive diabetic fold ulcer. The patient underwent incision and drainage by Dr. Ranell Patrick and currently has an open wound that is wrapped. The patient is on IV antibiotics  Consultants:  Dr. Parke Simmers surgery  Procedures:  Status post incision and drainage 12/23/2011  Antibiotics:  Flagyl 8/6 >  Moxifloxacin > 8/6  Vancomycin > 8/6  HPI/Subjective: Patient is in good spirits and has no significant complaints this morning. However the patient is very anxious about the possible outcome of her foot given the  extensive the abscess.  Objective: Filed Vitals:   12/24/11 0045 12/24/11 0055 12/24/11 0457 12/24/11 1403  BP: 137/65 129/76 127/56 121/70  Pulse: 81 83 89 93  Temp: 97.4 F (36.3 C) 99.1 F (37.3 C) 97.8 F (36.6 C) 98.4 F (36.9 C)  TempSrc:  Oral Oral Oral  Resp: 18 16  20   Height:      Weight:      SpO2: 100% 100% 100% 99%   Weight change:   Intake/Output Summary (Last 24 hours) at 12/24/11 2037 Last data filed at 12/24/11 1825  Gross per 24 hour  Intake   2680 ml  Output    120 ml  Net   2560 ml    General: Alert, awake, oriented x3, in no acute distress.  Heart: Regular rate and rhythm, without murmurs, rubs, gallops.  Lungs: Clear to auscultation Neuro: No focal neurological deficits noted cranial nerves II through XII grossly intact.  Musculoskeletal: No warm swelling or erythema around joints. Left foot is currently wrapped status post I and D. patient has good distal pulses and capillary refill is less than 3 seconds.    Data Reviewed: Basic Metabolic Panel:  Lab 12/23/11 8756 12/23/11 1048  NA -- 137  K -- 3.5  CL -- 99  CO2 -- 25  GLUCOSE -- 193*  BUN -- 19  CREATININE 0.86 0.90  CALCIUM -- 9.0  MG -- --  PHOS -- --   Liver Function Tests: No results found for this basename: AST:5,ALT:5,ALKPHOS:5,BILITOT:5,PROT:5,ALBUMIN:5 in the last 168 hours No results found for this basename: LIPASE:5,AMYLASE:5 in the last 168 hours No results found for this basename: AMMONIA:5 in the last 168 hours CBC:  Lab 12/24/11 0538 12/23/11 1607  12/23/11 1315 12/23/11 1048  WBC 15.7* 18.3* 19.8* 22.3*  NEUTROABS 13.0* -- -- --  HGB 9.9* 11.4* 11.3* 11.9*  HCT 30.6* 34.0* 33.6* 35.6*  MCV 85.2 83.7 83.6 84.0  PLT 206 227 225 251   Cardiac Enzymes: No results found for this basename: CKTOTAL:5,CKMB:5,CKMBINDEX:5,TROPONINI:5 in the last 168 hours BNP (last 3 results) No results found for this basename: PROBNP:3 in the last 8760 hours CBG:  Lab 12/24/11 1653  12/24/11 1202 12/24/11 0750 12/23/11 2344 12/23/11 2057  GLUCAP 92 151* 211* 126* 142*    Recent Results (from the past 240 hour(s))  SURGICAL PCR SCREEN     Status: Normal   Collection Time   12/23/11  7:43 PM      Component Value Range Status Comment   MRSA, PCR NEGATIVE  NEGATIVE Final    Staphylococcus aureus NEGATIVE  NEGATIVE Final   AFB CULTURE WITH SMEAR     Status: Normal (Preliminary result)   Collection Time   12/23/11 11:05 PM      Component Value Range Status Comment   Specimen Description WOUND LEFT FOOT   Final    Special Requests NONE   Final    ACID FAST SMEAR NO ACID FAST BACILLI SEEN   Final    Culture     Final    Value: CULTURE WILL BE EXAMINED FOR 6 WEEKS BEFORE ISSUING A FINAL REPORT   Report Status PENDING   Incomplete   ANAEROBIC CULTURE     Status: Normal (Preliminary result)   Collection Time   12/23/11 11:05 PM      Component Value Range Status Comment   Specimen Description WOUND LEFT FOOT   Final    Special Requests NONE   Final    Gram Stain     Final    Value: FEW WBC PRESENT,BOTH PMN AND MONONUCLEAR     NO SQUAMOUS EPITHELIAL CELLS SEEN     ABUNDANT GRAM POSITIVE COCCI IN PAIRS   Culture     Final    Value: NO ANAEROBES ISOLATED; CULTURE IN PROGRESS FOR 5 DAYS   Report Status PENDING   Incomplete   WOUND CULTURE     Status: Normal (Preliminary result)   Collection Time   12/23/11 11:05 PM      Component Value Range Status Comment   Specimen Description WOUND LEFT FOOT   Final    Special Requests NONE   Final    Gram Stain     Final    Value: FEW WBC PRESENT,BOTH PMN AND MONONUCLEAR     NO SQUAMOUS EPITHELIAL CELLS SEEN     ABUNDANT GRAM POSITIVE COCCI IN PAIRS   Culture PENDING   Incomplete    Report Status PENDING   Incomplete      Studies: Dg Chest 2 View  12/15/2011  *RADIOLOGY REPORT*  Clinical Data: Dizziness  CHEST - 2 VIEW  Comparison: 11/26/2011  Findings: Normal heart size.  Under aerated and grossly clear lungs.  No pneumothorax.   No pleural effusions.  Stable thoracic spine.  IMPRESSION: No active cardiopulmonary disease.  Original Report Authenticated By: Donavan Burnet, M.D.   Dg Chest 2 View  11/26/2011  *RADIOLOGY REPORT*  Clinical Data: Shortness of breath and weakness.  CHEST - 2 VIEW  Comparison: PA and lateral chest 05/29/2011.  Findings: Lungs are clear.  Heart size is normal.  No pneumothorax or pleural fluid.  IMPRESSION: Negative chest.  Original Report Authenticated By: Bernadene Bell. Maricela Curet, M.D.  Ct Head Wo Contrast  12/23/2011  *RADIOLOGY REPORT*  Clinical Data: Right frontal headaches  CT HEAD WITHOUT CONTRAST  Technique:  Contiguous axial images were obtained from the base of the skull through the vertex without contrast.  Comparison: July 01, 2011  Findings: The ventricles are normal in size, shape, and position. There is no mass effect or midline shift.  No acute hemorrhage or abnormal extra-axial fluid collections are identified.  The gray/white differentiation is normal.  The orbits, calvarium, visualized paranasal sinuses have a normal appearance.  IMPRESSION: Normal CT scan of the head with no evidence of acute intracranial abnormality.  Original Report Authenticated By: Brandon Melnick, M.D.   Ct Head Wo Contrast  12/15/2011  *RADIOLOGY REPORT*  Clinical Data: Dizziness.  Nausea.  Headache.  Left arm numbness.  CT HEAD WITHOUT CONTRAST  Technique:  Contiguous axial images were obtained from the base of the skull through the vertex without contrast.  Comparison: 07/02/2011  Findings: The brain shows generalized atrophy.  No sign of old or acute focal infarction, mass lesion, hemorrhage, hydrocephalus or extra-axial collection.  The calvarium is unremarkable.  Sinuses, middle ears and mastoids are clear.  IMPRESSION: Negative head CT.  Mild generalized atrophy.  No focal or acute finding.  Original Report Authenticated By: Thomasenia Sales, M.D.   Mri Brain Without Contrast  12/16/2011  *RADIOLOGY REPORT*   Clinical Data:  Left sided numbness.  Facial droop  MRI HEAD WITHOUT CONTRAST MRA HEAD WITHOUT CONTRAST  Technique:  Multiplanar, multiecho pulse sequences of the brain and surrounding structures were obtained without intravenous contrast. Angiographic images of the head were obtained using MRA technique without contrast.  Comparison:  CT 12/15/2011  MRI HEAD  Findings:  Sub centimeter acute infarct in the right frontal white matter.  No other areas of acute infarct.  Mild chronic microvascular ischemic change in the white matter. Ventricle size is normal.  Negative for hemorrhage or mass. Paranasal sinuses are clear.  IMPRESSION: Acute infarct in the right frontal white matter adjacent to the frontal horn of the lateral ventricle.  MRA HEAD  Findings: Left vertebral artery is dominant.  Small right vertebral artery with a moderate stenosis distally.  Mild irregularity the basilar due to atherosclerotic disease.  PICA is patent bilaterally.  Superior cerebellar and posterior cerebral arteries are patent.  Fetal origin of the posterior cerebral artery bilaterally.  Basilar artery ends in the superior cerebellar arteries.  Internal carotid artery is patent bilaterally with mild atherosclerotic disease in the cavernous segment.  Anterior and middle cerebral arteries are patent bilaterally.  Mild irregularity of the middle cerebral artery branches bilaterally suggesting atherosclerotic disease.  Negative for cerebral aneurysm.  IMPRESSION: Mild to moderate intracranial atherosclerotic disease without large vessel occlusion.  Original Report Authenticated By: Camelia Phenes, M.D.   Dg Foot Complete Left  12/23/2011  *RADIOLOGY REPORT*  Clinical Data: Ulcer left foot.  LEFT FOOT - COMPLETE 3+ VIEW  Comparison: 09/04/2011.  Findings: There is soft tissue gas at the base of the left great toe and extending between the first and second metatarsals compatible with infection.  No radiographic changes of osteomyelitis at this  time.  Old deformity and destruction of the left fifth phalanges, stable.  IMPRESSION: Extensive soft tissue gas in the base of the left great toe and extending between the first and second metatarsals compatible with soft tissue infection.  No radiographic changes of acute osteomyelitis.  Original Report Authenticated By: Cyndie Chime, M.D.  Mr Maxine Glenn Head/brain Wo Cm  12/16/2011  *RADIOLOGY REPORT*  Clinical Data:  Left sided numbness.  Facial droop  MRI HEAD WITHOUT CONTRAST MRA HEAD WITHOUT CONTRAST  Technique:  Multiplanar, multiecho pulse sequences of the brain and surrounding structures were obtained without intravenous contrast. Angiographic images of the head were obtained using MRA technique without contrast.  Comparison:  CT 12/15/2011  MRI HEAD  Findings:  Sub centimeter acute infarct in the right frontal white matter.  No other areas of acute infarct.  Mild chronic microvascular ischemic change in the white matter. Ventricle size is normal.  Negative for hemorrhage or mass. Paranasal sinuses are clear.  IMPRESSION: Acute infarct in the right frontal white matter adjacent to the frontal horn of the lateral ventricle.  MRA HEAD  Findings: Left vertebral artery is dominant.  Small right vertebral artery with a moderate stenosis distally.  Mild irregularity the basilar due to atherosclerotic disease.  PICA is patent bilaterally.  Superior cerebellar and posterior cerebral arteries are patent.  Fetal origin of the posterior cerebral artery bilaterally.  Basilar artery ends in the superior cerebellar arteries.  Internal carotid artery is patent bilaterally with mild atherosclerotic disease in the cavernous segment.  Anterior and middle cerebral arteries are patent bilaterally.  Mild irregularity of the middle cerebral artery branches bilaterally suggesting atherosclerotic disease.  Negative for cerebral aneurysm.  IMPRESSION: Mild to moderate intracranial atherosclerotic disease without large vessel  occlusion.  Original Report Authenticated By: Camelia Phenes, M.D.    Scheduled Meds:   . clopidogrel  75 mg Oral Q breakfast  . enoxaparin (LOVENOX) injection  40 mg Subcutaneous Q24H  . glimepiride  4 mg Oral QAC breakfast  . HYDROmorphone      . insulin aspart  0-20 Units Subcutaneous TID WC  . insulin aspart  0-5 Units Subcutaneous QHS  . insulin aspart  6 Units Subcutaneous TID WC  . insulin glargine  25 Units Subcutaneous QHS  . lisinopril  20 mg Oral Daily  . metronidazole  500 mg Intravenous Q8H  . morphine      . moxifloxacin  400 mg Intravenous Q24H  . simvastatin  40 mg Oral QPM  . vancomycin  1,000 mg Intravenous Q12H   Continuous Infusions:   . sodium chloride 50 mL/hr at 12/24/11 0456    Active Problems:  CVA (cerebral infarction)  Irritable bowel syndrome  DM type 2, uncontrolled, with neuropathy  Syncope  TIA (transient ischemic attack)  Cellulitis of left lower extremity

## 2011-12-24 NOTE — Progress Notes (Signed)
Received pt.from PACU ,asleep ,but arousable,with IVF infusing well on lt.hand.Also with ace wrap dsd.on left lower ext..toes cold to touch but with good capillary refill.Will continue to monitor pt.

## 2011-12-24 NOTE — Op Note (Signed)
NAMETASHUNDA, VANDEZANDE              ACCOUNT NO.:  [id number]  MEDICAL RECORD NO.:  [id number]  LOCATION:  5524                         FACILITY:  [facility]  PHYSICIAN:  Almedia Balls. Ranell Patrick, M.D. DATE OF BIRTH:  08-10-1955  DATE OF PROCEDURE:  12/23/2011 DATE OF DISCHARGE:                              OPERATIVE REPORT   PREOPERATIVE DIAGNOSIS:  Left foot abscess.  POSTOPERATIVE DIAGNOSIS:  Left foot abscess.  PROCEDURE PERFORMED:  Left foot I and D, deep with debridement of muscle as well.  ATTENDING SURGEON:  Almedia Balls. Ranell Patrick, M.D.  ASSISTANT:  None.  ANESTHESIA:  LMA general anesthesia was used.  ESTIMATED BLOOD LOSS:  Minimal.  FLUID REPLACEMENT:  1200 mL crystalloid.  INSTRUMENT COUNTS:  Correct.  COMPLICATIONS:  No complications.  Preoperative antibiotics were given.  INDICATIONS:  The patient is a 56 year old diabetic with worsening left foot swelling and erythema over the last week.  The patient has had progressive symptoms especially over the last 2 days where her white count spiked.  She has been admitted by Internal Medicine and Orthopedics was consulted.  On review of x-rays, there was noted to be free air in the soft tissues and clinical observation was highly suspicious for an abscess.  The patient has a plantar ulcer under the first metatarsophalangeal joint, and also a large bolus which looks like a bloody blister on the dorsal first web space between the great toe and second toe.  Discussed this with the patient and discussed the need for emergent surgical decompression and debridement.  She agreed to this. Informed consent obtained.  DESCRIPTION OF PROCEDURE:  After adequate level of anesthesia was achieved, the patient was positioned supine on the operative room table. Left foot was sterilely prepped and draped with nonsterile tourniquet in place prior on the calf.  Time-out called.  We then unroofed the ulcer on the bottom aspect of the foot, it did go  deep and pus was coming out of that.  Also we made a dorsal incision, the blister popped, and there was just some blood under that we felt some fluctuance and incised the first web space and immediately pus came out.  We cultured that, sent that to Pathology for Gram stain, aerobic, and anaerobic culture.  We then went ahead and started sharp debridement of all nonviable tissues. We completed the dissection down through the interspace to the plantar foot.  This communicated and we extended the incision on the plantar surface just to get a full exposure and decompression.  All purulent material removed.  Pulse irrigation with 6 L of normal saline irrigation and then dilute Betadine soaked gauze was placed dorsally and also a separate gauze placed plantarly to keep all those tissues open and draining.  This patient will definitely need subsequent debridement and more definitive closure versus placement of wound VAC.  This will be a temporizing measure only, but serve as the significant debulking of the nonvitalized tissue and hopefully turn her clinical situation around. We will place her on broad-spectrum antibiotics as she had a foul- smelling wound and purulence and we will most likely see multi-organism involvement on the culture.  The patient then had a bulky dressing placed  and taken to the recovery room in a stable condition.     Almedia Balls. Ranell Patrick, M.D.     SRN/MEDQ  D:  12/23/2011  T:  12/24/2011  Job:  [id number]

## 2011-12-25 LAB — COMPREHENSIVE METABOLIC PANEL
ALT: 40 U/L — ABNORMAL HIGH (ref 0–35)
AST: 34 U/L (ref 0–37)
CO2: 25 mEq/L (ref 19–32)
Chloride: 99 mEq/L (ref 96–112)
GFR calc non Af Amer: 62 mL/min — ABNORMAL LOW (ref >90)
Sodium: 137 mEq/L (ref 135–145)
Total Bilirubin: 0.3 mg/dL (ref 0.3–1.2)

## 2011-12-25 LAB — GLUCOSE, CAPILLARY: Glucose-Capillary: 188 mg/dL — ABNORMAL HIGH (ref 70–99)

## 2011-12-25 LAB — CBC
Hemoglobin: 11.3 g/dL — ABNORMAL LOW (ref 12.0–15.0)
MCH: 28.1 pg (ref 26.0–34.0)
MCHC: 33.2 g/dL (ref 30.0–36.0)

## 2011-12-25 MED ORDER — CHLORHEXIDINE GLUCONATE 4 % EX LIQD
60.0000 mL | Freq: Once | CUTANEOUS | Status: AC
  Administered 2011-12-25: 4 via TOPICAL
  Filled 2011-12-25: qty 60

## 2011-12-25 MED ORDER — SODIUM CHLORIDE 0.9 % IV SOLN
INTRAVENOUS | Status: DC
  Administered 2011-12-26: 04:00:00 via INTRAVENOUS

## 2011-12-25 MED ORDER — FENTANYL CITRATE 0.05 MG/ML IJ SOLN: 50.0000 ug | INTRAMUSCULAR | Status: DC | PRN

## 2011-12-25 MED ORDER — MIDAZOLAM HCL 2 MG/2ML IJ SOLN: 0.5000 mg | INTRAMUSCULAR | Status: DC | PRN

## 2011-12-25 NOTE — Progress Notes (Signed)
Orthopedic Tech Progress Note Patient Details:  [name] 1955-07-26 [id number]  Ortho Devices Type of Ortho Device: Other (comment) (darco shoe) Ortho Device/Splint Location: left foot Ortho Device/Splint Interventions: Application   [name] 12/25/2011, 2:50 PM

## 2011-12-25 NOTE — Progress Notes (Signed)
Hypoglycemic Event  CBG: 62  Treatment: 15 GM carbohydrate snack  Symptoms: None  Follow-up CBG: Time:1220 CBG Result:71  Possible Reasons for Event: Unknown  Comments/MD notified: yes    Mandalyn Pasqua Czarnecki  Remember to initiate Hypoglycemia Order Set & complete

## 2011-12-25 NOTE — Progress Notes (Signed)
[facility] PROGRESS NOTE  JACQLYN MAROLF NWG:[id number] DOB: [birth year] DOA: 12/23/2011 PCP: [name], MD  Assessment/Plan:  Diabetic foot ulcer Active Problems:  CVA (cerebral infarction)  Irritable bowel syndrome  DM type 2, uncontrolled, with neuropathy  Syncope  TIA (transient ischemic attack)  Cellulitis of left lower extremity  1. Diabetic foot ulcer: Patient is status post I&D of diabetic foot ulcer left foot. Patient seen with Dr. Devonne Doughty today. He anticipates further debridement which will likely be done by Dr. Victorino Dike.  At this time I will continue with the vancomycin, Avelox and metronidazole. 2. Diabetes type 2: Patient's blood sugars are currently well controlled we'll continue current regimen. 3. Recent CVA: Patient will continue on her Plavix on a daily basis. 4. Hyperlipidemia: Patient will continue on her simvastatin 5. Hypertension: Continue Prinivil.  Code Status: Full code Family Communication: Not applicable Disposition Plan: Not yet determined  Anokhi Shannon A.  [facility] Pager [phone]. If 8PM-8AM, please contact night-coverage at [email or web address], password [email or web address] 12/25/2011, 8:06 PM  LOS: 2 days   Brief narrative: This is a patient with uncontrolled diabetes type 2 who presented to the emergency room with what appeared to be cellulitis of the left lower extremity. It turns out that this was an extensive diabetic fold ulcer. The patient underwent incision and drainage by Dr. Ranell Patrick and currently has an open wound that is wrapped. The patient is on IV antibiotics  Consultants:  Dr. Parke Simmers surgery  Procedures:  Status post incision and drainage 12/23/2011  Antibiotics:  Flagyl 8/6 >  Moxifloxacin > 8/6  Vancomycin > 8/6  HPI/Subjective: Patient is in good spirits but is concerned about the possibility of a partial amputation.  Objective: Filed Vitals:   12/24/11 2124 12/25/11 0513 12/25/11 1004 12/25/11 1320  BP:  130/69 166/78 148/84 171/81  Pulse: 86 81  86  Temp: 99.4 F (37.4 C) 99.5 F (37.5 C)  98.3 F (36.8 C)  TempSrc: Oral Oral  Oral  Resp: 20 20  20   Height:      Weight:      SpO2: 98% 98%  98%   Weight change:   Intake/Output Summary (Last 24 hours) at 12/25/11 2006 Last data filed at 12/25/11 1838  Gross per 24 hour  Intake 2707.5 ml  Output      0 ml  Net 2707.5 ml    General: Alert, awake, oriented x3, in no acute distress.  Heart: Regular rate and rhythm, without murmurs, rubs, gallops.  Lungs: Clear to auscultation. No wheezing or rhonchi noted Neuro: No focal neurological deficits noted cranial nerves II through XII grossly intact.  Musculoskeletal: No warm swelling or erythema around joints. Left foot has no significant drainage and has markedly decreased erythema is noted 2 days ago. She has good distal pulses and capillary refill is less than 3 seconds   Data Reviewed: Basic Metabolic Panel:  Lab 12/23/11 2952 12/23/11 1048  NA -- 137  K -- 3.5  CL -- 99  CO2 -- 25  GLUCOSE -- 193*  BUN -- 19  CREATININE 0.86 0.90  CALCIUM -- 9.0  MG -- --  PHOS -- --   Liver Function Tests: No results found for this basename: AST:5,ALT:5,ALKPHOS:5,BILITOT:5,PROT:5,ALBUMIN:5 in the last 168 hours No results found for this basename: LIPASE:5,AMYLASE:5 in the last 168 hours No results found for this basename: AMMONIA:5 in the last 168 hours CBC:  Lab 12/24/11 0538 12/23/11 1607 12/23/11 1315 12/23/11 1048  WBC 15.7* 18.3* 19.8* 22.3*  NEUTROABS 13.0* -- -- --  HGB 9.9* 11.4* 11.3* 11.9*  HCT 30.6* 34.0* 33.6* 35.6*  MCV 85.2 83.7 83.6 84.0  PLT 206 227 225 251   Cardiac Enzymes: No results found for this basename: CKTOTAL:5,CKMB:5,CKMBINDEX:5,TROPONINI:5 in the last 168 hours BNP (last 3 results) No results found for this basename: PROBNP:3 in the last 8760 hours CBG:  Lab 12/25/11 1719 12/25/11 1217 12/25/11 1204 12/25/11 0732 12/24/11 2132  GLUCAP 188* 71 62*  124* 105*    Recent Results (from the past 240 hour(s))  SURGICAL PCR SCREEN     Status: Normal   Collection Time   12/23/11  7:43 PM      Component Value Range Status Comment   MRSA, PCR NEGATIVE  NEGATIVE Final    Staphylococcus aureus NEGATIVE  NEGATIVE Final   AFB CULTURE WITH SMEAR     Status: Normal (Preliminary result)   Collection Time   12/23/11 11:05 PM      Component Value Range Status Comment   Specimen Description WOUND LEFT FOOT   Final    Special Requests NONE   Final    ACID FAST SMEAR NO ACID FAST BACILLI SEEN   Final    Culture     Final    Value: CULTURE WILL BE EXAMINED FOR 6 WEEKS BEFORE ISSUING A FINAL REPORT   Report Status PENDING   Incomplete   ANAEROBIC CULTURE     Status: Normal (Preliminary result)   Collection Time   12/23/11 11:05 PM      Component Value Range Status Comment   Specimen Description WOUND LEFT FOOT   Final    Special Requests NONE   Final    Gram Stain     Final    Value: FEW WBC PRESENT,BOTH PMN AND MONONUCLEAR     NO SQUAMOUS EPITHELIAL CELLS SEEN     ABUNDANT GRAM POSITIVE COCCI IN PAIRS   Culture     Final    Value: NO ANAEROBES ISOLATED; CULTURE IN PROGRESS FOR 5 DAYS   Report Status PENDING   Incomplete   WOUND CULTURE     Status: Normal (Preliminary result)   Collection Time   12/23/11 11:05 PM      Component Value Range Status Comment   Specimen Description WOUND LEFT FOOT   Final    Special Requests NONE   Final    Gram Stain     Final    Value: FEW WBC PRESENT,BOTH PMN AND MONONUCLEAR     NO SQUAMOUS EPITHELIAL CELLS SEEN     ABUNDANT GRAM POSITIVE COCCI IN PAIRS   Culture PENDING   Incomplete    Report Status PENDING   Incomplete      Studies: Dg Chest 2 View  12/15/2011  *RADIOLOGY REPORT*  Clinical Data: Dizziness  CHEST - 2 VIEW  Comparison: 11/26/2011  Findings: Normal heart size.  Under aerated and grossly clear lungs.  No pneumothorax.  No pleural effusions.  Stable thoracic spine.  IMPRESSION: No active  cardiopulmonary disease.  Original Report Authenticated By: Donavan Burnet, M.D.   Dg Chest 2 View  11/26/2011  *RADIOLOGY REPORT*  Clinical Data: Shortness of breath and weakness.  CHEST - 2 VIEW  Comparison: PA and lateral chest 05/29/2011.  Findings: Lungs are clear.  Heart size is normal.  No pneumothorax or pleural fluid.  IMPRESSION: Negative chest.  Original Report Authenticated By: Bernadene Bell. Maricela Curet, M.D.   Ct Head Wo Contrast  12/23/2011  *RADIOLOGY REPORT*  Clinical Data: Right frontal headaches  CT  HEAD WITHOUT CONTRAST  Technique:  Contiguous axial images were obtained from the base of the skull through the vertex without contrast.  Comparison: July 01, 2011  Findings: The ventricles are normal in size, shape, and position. There is no mass effect or midline shift.  No acute hemorrhage or abnormal extra-axial fluid collections are identified.  The gray/white differentiation is normal.  The orbits, calvarium, visualized paranasal sinuses have a normal appearance.  IMPRESSION: Normal CT scan of the head with no evidence of acute intracranial abnormality.  Original Report Authenticated By: Brandon Melnick, M.D.   Ct Head Wo Contrast  12/15/2011  *RADIOLOGY REPORT*  Clinical Data: Dizziness.  Nausea.  Headache.  Left arm numbness.  CT HEAD WITHOUT CONTRAST  Technique:  Contiguous axial images were obtained from the base of the skull through the vertex without contrast.  Comparison: 07/02/2011  Findings: The brain shows generalized atrophy.  No sign of old or acute focal infarction, mass lesion, hemorrhage, hydrocephalus or extra-axial collection.  The calvarium is unremarkable.  Sinuses, middle ears and mastoids are clear.  IMPRESSION: Negative head CT.  Mild generalized atrophy.  No focal or acute finding.  Original Report Authenticated By: Thomasenia Sales, M.D.   Mri Brain Without Contrast  12/16/2011  *RADIOLOGY REPORT*  Clinical Data:  Left sided numbness.  Facial droop  MRI HEAD WITHOUT  CONTRAST MRA HEAD WITHOUT CONTRAST  Technique:  Multiplanar, multiecho pulse sequences of the brain and surrounding structures were obtained without intravenous contrast. Angiographic images of the head were obtained using MRA technique without contrast.  Comparison:  CT 12/15/2011  MRI HEAD  Findings:  Sub centimeter acute infarct in the right frontal white matter.  No other areas of acute infarct.  Mild chronic microvascular ischemic change in the white matter. Ventricle size is normal.  Negative for hemorrhage or mass. Paranasal sinuses are clear.  IMPRESSION: Acute infarct in the right frontal white matter adjacent to the frontal horn of the lateral ventricle.  MRA HEAD  Findings: Left vertebral artery is dominant.  Small right vertebral artery with a moderate stenosis distally.  Mild irregularity the basilar due to atherosclerotic disease.  PICA is patent bilaterally.  Superior cerebellar and posterior cerebral arteries are patent.  Fetal origin of the posterior cerebral artery bilaterally.  Basilar artery ends in the superior cerebellar arteries.  Internal carotid artery is patent bilaterally with mild atherosclerotic disease in the cavernous segment.  Anterior and middle cerebral arteries are patent bilaterally.  Mild irregularity of the middle cerebral artery branches bilaterally suggesting atherosclerotic disease.  Negative for cerebral aneurysm.  IMPRESSION: Mild to moderate intracranial atherosclerotic disease without large vessel occlusion.  Original Report Authenticated By: Camelia Phenes, M.D.   Dg Foot Complete Left  12/23/2011  *RADIOLOGY REPORT*  Clinical Data: Ulcer left foot.  LEFT FOOT - COMPLETE 3+ VIEW  Comparison: 09/04/2011.  Findings: There is soft tissue gas at the base of the left great toe and extending between the first and second metatarsals compatible with infection.  No radiographic changes of osteomyelitis at this time.  Old deformity and destruction of the left fifth phalanges,  stable.  IMPRESSION: Extensive soft tissue gas in the base of the left great toe and extending between the first and second metatarsals compatible with soft tissue infection.  No radiographic changes of acute osteomyelitis.  Original Report Authenticated By: Cyndie Chime, M.D.   Mr Mra Head/brain Wo Cm  12/16/2011  *RADIOLOGY REPORT*  Clinical Data:  Left sided numbness.  Facial droop  MRI HEAD WITHOUT CONTRAST MRA HEAD WITHOUT CONTRAST  Technique:  Multiplanar, multiecho pulse sequences of the brain and surrounding structures were obtained without intravenous contrast. Angiographic images of the head were obtained using MRA technique without contrast.  Comparison:  CT 12/15/2011  MRI HEAD  Findings:  Sub centimeter acute infarct in the right frontal white matter.  No other areas of acute infarct.  Mild chronic microvascular ischemic change in the white matter. Ventricle size is normal.  Negative for hemorrhage or mass. Paranasal sinuses are clear.  IMPRESSION: Acute infarct in the right frontal white matter adjacent to the frontal horn of the lateral ventricle.  MRA HEAD  Findings: Left vertebral artery is dominant.  Small right vertebral artery with a moderate stenosis distally.  Mild irregularity the basilar due to atherosclerotic disease.  PICA is patent bilaterally.  Superior cerebellar and posterior cerebral arteries are patent.  Fetal origin of the posterior cerebral artery bilaterally.  Basilar artery ends in the superior cerebellar arteries.  Internal carotid artery is patent bilaterally with mild atherosclerotic disease in the cavernous segment.  Anterior and middle cerebral arteries are patent bilaterally.  Mild irregularity of the middle cerebral artery branches bilaterally suggesting atherosclerotic disease.  Negative for cerebral aneurysm.  IMPRESSION: Mild to moderate intracranial atherosclerotic disease without large vessel occlusion.  Original Report Authenticated By: Camelia Phenes, M.D.     Scheduled Meds:    . chlorhexidine  60 mL Topical Once  . clopidogrel  75 mg Oral Q breakfast  . glimepiride  4 mg Oral QAC breakfast  . insulin aspart  0-20 Units Subcutaneous TID WC  . insulin aspart  0-5 Units Subcutaneous QHS  . insulin aspart  6 Units Subcutaneous TID WC  . insulin glargine  25 Units Subcutaneous QHS  . lisinopril  20 mg Oral Daily  . metronidazole  500 mg Intravenous Q8H  . moxifloxacin  400 mg Intravenous Q24H  . simvastatin  40 mg Oral QPM  . vancomycin  1,000 mg Intravenous Q12H  . DISCONTD: enoxaparin (LOVENOX) injection  40 mg Subcutaneous Q24H   Continuous Infusions:    . sodium chloride 50 mL (12/25/11 0820)  . sodium chloride      Principal Problem:  *Diabetic ulcer of left foot Active Problems:  HYPERLIPIDEMIA  HYPERTENSION  CVA (cerebral infarction)  Irritable bowel syndrome  DM type 2, uncontrolled, with neuropathy  Syncope  TIA (transient ischemic attack)  Cellulitis of left lower extremity

## 2011-12-25 NOTE — Progress Notes (Signed)
Subjective: Pt denies any pain in the left foot.  No f/c/n/v.  Dressing changed by Dr. Ranell Patrick earlier today.  Objective: Vital signs in last 24 hours: Temp:  [98.3 F (36.8 C)-99.5 F (37.5 C)] 98.3 F (36.8 C) 02-Jan-2023 1320) Pulse Rate:  [81-86] 86  01-02-2023 1320) Resp:  [20] 20  2023/01/02 1320) BP: (130-171)/(69-84) 171/81 mmHg 02-Jan-2023 1320) SpO2:  [98 %] 98 % 01-02-2023 1320)  Intake/Output from previous day: 08/07 0701 - Jan 02, 2023 0700 In: 2130 [P.O.:780; I.V.:850; IV Piggyback:500] Out: -  Intake/Output this shift: Total I/O In: 580 [P.O.:480; IV Piggyback:100] Out: -    Basename 12/24/11 0538 12/23/11 1607 12/23/11 1315 12/23/11 1048  HGB 9.9* 11.4* 11.3* 11.9*    Basename 12/24/11 0538 12/23/11 1607  WBC 15.7* 18.3*  RBC 3.59* 4.06  HCT 30.6* 34.0*  PLT 206 227    Basename 12/23/11 1315 12/23/11 1048  NA -- 137  K -- 3.5  CL -- 99  CO2 -- 25  BUN -- 19  CREATININE 0.86 0.90  GLUCOSE -- 193*  CALCIUM -- 9.0   PE:  Left foot dressed and dry.  Toes with brisk cap refill.  Diminished sens to LT at toes.  Brisk cap refill at toes.    Assessment/Plan: Left foot diabetic ulcer s/p I and D.  To OR tomorrow for repeat I and D and possible wound VAC placement.  The risks and benefits of the alternative treatment options have been discussed in detail.  The patient wishes to proceed with surgery and specifically understands risks of bleeding, infection, nerve damage, blood clots, need for additional surgery, amputation and death.    Toni Arthurs 02-Jan-2012, 6:07 PM

## 2011-12-25 NOTE — Progress Notes (Addendum)
Physical Therapy Treatment Patient Details Name: [name] MRN: [id number] DOB: 27-Apr-1956 Today's Date: 12/25/2011 Time: 7253-6644 PT Time Calculation (min): 9 min  PT Assessment / Plan / Recommendation Comments on Treatment Session  Pt currently requiring mod assist to stand from chair height surface and up to mod assist for balance with RW (however was still having to do NWB on LLE due to inability to get lt heel down). Will need further PT to incr safety prior to d/c home. Pt now observed trying to get up to go to the bathroom by herself (despite education to call for assist), demonstrating poor safety awareness and poor awareness of deficits. Recommend 24 hr care on d/c and pt states she only has intermittent assist lined up.    Follow Up Recommendations  Home health PT;Supervision/Assistance - 24 hour (? if pt can arrange 24 hr care)    Barriers to Discharge        Equipment Recommendations  3 in 1 bedside comode    Recommendations for Other Services OT consult  Frequency Min 5X/week   Plan Discharge plan needs to be updated;Frequency remains appropriate    Precautions / Restrictions Precautions Precautions: Fall Restrictions Weight Bearing Restrictions: Yes LLE Weight Bearing: Partial weight bearing (May weight bear through heel only (full thru heel)) LLE Partial Weight Bearing Percentage or Pounds: full wt bear thru heel only   Pertinent Vitals/Pain 8/10 Lt forefoot after attempted ambulation     Mobility  Bed Mobility Bed Mobility: Not assessed Transfers Transfers: Sit to Stand;Stand to Sit Sit to Stand: From bed;With upper extremity assist;3: Mod assist;From chair/3-in-1 Stand to Sit: 4: Min guard;With upper extremity assist;To chair/3-in-1 Details for Transfer Assistance: minguard assist from higher surface of bed; mod assist from recliner with max cues to sequence and keep weight off Lt forefoot Ambulation/Gait Ambulation/Gait Assistance: 3: Mod  assist Ambulation Distance (Feet): 5 Feet Assistive device: Rolling walker Ambulation/Gait Assistance Details: Pt unable to place just her Lt heel on the floor due to thickness of bandage under her forefoot. Darco shoe has not been ordered (spoke with RN and left note for MD re: she will need this shoe to assist with off-loading forefoot).  Pt ended up "hopping" on RLE to keep weight off LLE. Pt with loss of balance when she lifted RW off the floor requiring mod assist to maintain balance. Gait Pattern: Step-to pattern;Decreased stride length    Exercises     PT Diagnosis:    PT Problem List:   PT Treatment Interventions:     PT Goals Acute Rehab PT Goals Pt will go Sit to Stand: with supervision PT Goal: Sit to Stand - Progress: Progressing toward goal Pt will go Stand to Sit: with supervision PT Goal: Stand to Sit - Progress: Progressing toward goal Pt will Transfer Bed to Chair/Chair to Bed: Independently PT Transfer Goal: Bed to Chair/Chair to Bed - Progress: Progressing toward goal Pt will Ambulate: 51 - 150 feet;with supervision;with rolling walker PT Goal: Ambulate - Progress: Not progressing  Visit Information  Last PT Received On: 12/25/11 Assistance Needed: +1    Subjective Data  Subjective: I think they are supposed to change my bandage today.    Cognition  Overall Cognitive Status: Appears within functional limits for tasks assessed/performed    Balance     End of Session PT - End of Session Equipment Utilized During Treatment: Gait belt Activity Tolerance: Treatment limited secondary to medical complications (Comment) (unable to put heel down due to  thick bandage) Patient left: in chair;with call bell/phone within reach Nurse Communication: Mobility status;Weight bearing status (discussed with nurse tech); discussed with Diane, RN problems with thick bandage and need for order for Darco shoe.   GP     [name] 12/25/2011, 9:13 AM Pager [phone]

## 2011-12-26 ENCOUNTER — Encounter (HOSPITAL_COMMUNITY)
Admission: EM | Disposition: A | Discharge status: Home Health | Payer: Self-pay | Source: Home / Self Care | Attending: Internal Medicine

## 2011-12-26 ENCOUNTER — Inpatient Hospital Stay (HOSPITAL_COMMUNITY): Payer: Managed Care, Other (non HMO) | Admitting: Anesthesiology

## 2011-12-26 ENCOUNTER — Encounter (HOSPITAL_COMMUNITY): Payer: Self-pay | Admitting: Anesthesiology

## 2011-12-26 ENCOUNTER — Encounter (HOSPITAL_COMMUNITY): Payer: Self-pay | Admitting: Certified Registered Nurse Anesthetist

## 2011-12-26 HISTORY — PX: I&D EXTREMITY: SHX5045

## 2011-12-26 LAB — GLUCOSE, CAPILLARY
Glucose-Capillary: 53 mg/dL — ABNORMAL LOW (ref 70–99)
Glucose-Capillary: 228 mg/dL — ABNORMAL HIGH (ref 70–99)

## 2011-12-26 SURGERY — IRRIGATION AND DEBRIDEMENT EXTREMITY
Anesthesia: General | Site: Leg Lower | Laterality: Left | Wound class: Dirty or Infected

## 2011-12-26 MED ORDER — METOCLOPRAMIDE HCL 5 MG/ML IJ SOLN
5.0000 mg | Freq: Three times a day (TID) | INTRAMUSCULAR | Status: DC | PRN
  Filled 2011-12-26: qty 2

## 2011-12-26 MED ORDER — ONDANSETRON HCL 4 MG PO TABS: 4.0000 mg | ORAL_TABLET | Freq: Four times a day (QID) | ORAL | Status: DC | PRN

## 2011-12-26 MED ORDER — DEXTROSE 50 % IV SOLN
INTRAVENOUS | Status: AC
  Filled 2011-12-26: qty 50

## 2011-12-26 MED ORDER — HYDROMORPHONE HCL PF 1 MG/ML IJ SOLN: 0.2500 mg | INTRAMUSCULAR | Status: DC | PRN

## 2011-12-26 MED ORDER — DEXTROSE 50 % IV SOLN: 25.0000 mL | Freq: Once | INTRAVENOUS | Status: AC | PRN

## 2011-12-26 MED ORDER — ONDANSETRON HCL 4 MG/2ML IJ SOLN: 4.0000 mg | Freq: Four times a day (QID) | INTRAMUSCULAR | Status: DC | PRN

## 2011-12-26 MED ORDER — OXYCODONE HCL 5 MG PO TABS
10.0000 mg | ORAL_TABLET | ORAL | Status: DC | PRN
  Administered 2011-12-26 – 2011-12-27 (×2): 10 mg via ORAL
  Filled 2011-12-26 (×2): qty 2

## 2011-12-26 MED ORDER — METOCLOPRAMIDE HCL 5 MG PO TABS
5.0000 mg | ORAL_TABLET | Freq: Three times a day (TID) | ORAL | Status: DC | PRN
  Filled 2011-12-26: qty 2

## 2011-12-26 MED ORDER — ONDANSETRON HCL 4 MG/2ML IJ SOLN: 4.0000 mg | Freq: Once | INTRAMUSCULAR | Status: DC | PRN

## 2011-12-26 MED ORDER — EPHEDRINE SULFATE 50 MG/ML IJ SOLN
INTRAMUSCULAR | Status: DC | PRN
  Administered 2011-12-26: 5 mg via INTRAVENOUS

## 2011-12-26 MED ORDER — LACTATED RINGERS IV SOLN
INTRAVENOUS | Status: DC | PRN
  Administered 2011-12-26: 16:00:00 via INTRAVENOUS

## 2011-12-26 MED ORDER — DEXTROSE 50 % IV SOLN
INTRAVENOUS | Status: AC
  Administered 2011-12-26: 25 mL
  Filled 2011-12-26: qty 50

## 2011-12-26 MED ORDER — FENTANYL CITRATE 0.05 MG/ML IJ SOLN
INTRAMUSCULAR | Status: DC | PRN
  Administered 2011-12-26: 25 ug via INTRAVENOUS
  Administered 2011-12-26 (×3): 50 ug via INTRAVENOUS

## 2011-12-26 MED ORDER — PROPOFOL 10 MG/ML IV EMUL
INTRAVENOUS | Status: DC | PRN
  Administered 2011-12-26: 100 mg via INTRAVENOUS

## 2011-12-26 MED ORDER — MIDAZOLAM HCL 5 MG/5ML IJ SOLN
INTRAMUSCULAR | Status: DC | PRN
  Administered 2011-12-26: 2 mg via INTRAVENOUS

## 2011-12-26 MED ORDER — ONDANSETRON HCL 4 MG/2ML IJ SOLN
INTRAMUSCULAR | Status: DC | PRN
  Administered 2011-12-26: 4 mg via INTRAVENOUS

## 2011-12-26 MED ORDER — LIDOCAINE HCL (CARDIAC) 20 MG/ML IV SOLN
INTRAVENOUS | Status: DC | PRN
  Administered 2011-12-26: 40 mg via INTRAVENOUS

## 2011-12-26 SURGICAL SUPPLY — 51 items
BANDAGE ELASTIC 4 VELCRO ST LF (GAUZE/BANDAGES/DRESSINGS) ×3 IMPLANT
BANDAGE ESMARK 6X9 LF (GAUZE/BANDAGES/DRESSINGS) ×2 IMPLANT
BLADE LONG MED 31X9 (MISCELLANEOUS) ×3 IMPLANT
BLADE SURG 10 STRL SS (BLADE) ×3 IMPLANT
BNDG COHESIVE 4X5 TAN STRL (GAUZE/BANDAGES/DRESSINGS) ×3 IMPLANT
BNDG COHESIVE 6X5 TAN STRL LF (GAUZE/BANDAGES/DRESSINGS) ×3 IMPLANT
BNDG ESMARK 4X9 LF (GAUZE/BANDAGES/DRESSINGS) ×3 IMPLANT
BNDG ESMARK 6X9 LF (GAUZE/BANDAGES/DRESSINGS) ×3
CHLORAPREP W/TINT 26ML (MISCELLANEOUS) ×3 IMPLANT
CLOTH BEACON ORANGE TIMEOUT ST (SAFETY) ×3 IMPLANT
COVER SURGICAL LIGHT HANDLE (MISCELLANEOUS) ×3 IMPLANT
CUFF TOURNIQUET SINGLE 34IN LL (TOURNIQUET CUFF) ×3 IMPLANT
CUFF TOURNIQUET SINGLE 44IN (TOURNIQUET CUFF) IMPLANT
DRAPE U-SHAPE 47X51 STRL (DRAPES) ×6 IMPLANT
DRSG ADAPTIC 3X8 NADH LF (GAUZE/BANDAGES/DRESSINGS) ×3 IMPLANT
DRSG PAD ABDOMINAL 8X10 ST (GAUZE/BANDAGES/DRESSINGS) IMPLANT
ELECT REM PT RETURN 9FT ADLT (ELECTROSURGICAL) ×3
ELECTRODE REM PT RTRN 9FT ADLT (ELECTROSURGICAL) ×2 IMPLANT
GAUZE XEROFORM 5X9 LF (GAUZE/BANDAGES/DRESSINGS) ×3 IMPLANT
GLOVE BIO SURGEON STRL SZ8 (GLOVE) ×6 IMPLANT
GLOVE BIOGEL PI IND STRL 8 (GLOVE) ×2 IMPLANT
GLOVE BIOGEL PI INDICATOR 8 (GLOVE) ×1
GOWN PREVENTION PLUS XLARGE (GOWN DISPOSABLE) ×3 IMPLANT
GOWN STRL NON-REIN LRG LVL3 (GOWN DISPOSABLE) ×6 IMPLANT
KIT BASIN OR (CUSTOM PROCEDURE TRAY) ×3 IMPLANT
KIT ROOM TURNOVER OR (KITS) ×3 IMPLANT
MANIFOLD NEPTUNE II (INSTRUMENTS) ×3 IMPLANT
NS IRRIG 1000ML POUR BTL (IV SOLUTION) ×3 IMPLANT
PACK ORTHO EXTREMITY (CUSTOM PROCEDURE TRAY) ×3 IMPLANT
PAD ARMBOARD 7.5X6 YLW CONV (MISCELLANEOUS) ×6 IMPLANT
PAD CAST 4YDX4 CTTN HI CHSV (CAST SUPPLIES) ×2 IMPLANT
PADDING CAST COTTON 4X4 STRL (CAST SUPPLIES) ×1
SOAP 2 % CHG 4 OZ (WOUND CARE) ×3 IMPLANT
SPONGE GAUZE 4X4 12PLY (GAUZE/BANDAGES/DRESSINGS) ×3 IMPLANT
SPONGE LAP 18X18 X RAY DECT (DISPOSABLE) ×3 IMPLANT
STAPLER VISISTAT 35W (STAPLE) IMPLANT
STOCKINETTE IMPERVIOUS LG (DRAPES) IMPLANT
SUCTION FRAZIER TIP 10 FR DISP (SUCTIONS) ×3 IMPLANT
SUT ETHILON 2 0 PSLX (SUTURE) IMPLANT
SUT ETHILON 3 0 FSL (SUTURE) IMPLANT
SUT PROLENE 3 0 PS 2 (SUTURE) ×3 IMPLANT
SUT VIC AB 2-0 CT1 27 (SUTURE) ×2
SUT VIC AB 2-0 CT1 TAPERPNT 27 (SUTURE) ×4 IMPLANT
SUT VIC AB 3-0 PS2 18 (SUTURE) ×1
SUT VIC AB 3-0 PS2 18XBRD (SUTURE) ×2 IMPLANT
TOWEL OR 17X24 6PK STRL BLUE (TOWEL DISPOSABLE) ×3 IMPLANT
TOWEL OR 17X26 10 PK STRL BLUE (TOWEL DISPOSABLE) ×3 IMPLANT
TUBE CONNECTING 12X1/4 (SUCTIONS) ×3 IMPLANT
UNDERPAD 30X30 INCONTINENT (UNDERPADS AND DIAPERS) ×3 IMPLANT
WATER STERILE IRR 1000ML POUR (IV SOLUTION) ×3 IMPLANT
YANKAUER SUCT BULB TIP NO VENT (SUCTIONS) ×3 IMPLANT

## 2011-12-26 NOTE — Preoperative (Signed)
Beta Blockers   Reason not to administer Beta Blockers:Not Applicable 

## 2011-12-26 NOTE — Interval H&P Note (Signed)
History and Physical Interval Note:  12/26/2011 3:06 PM  [name]  has presented today for surgery, with the diagnosis of LEFT diabetic FOOT INFECTION  The various methods of treatment have been discussed with the patient and family. After consideration of risks, benefits and other options for treatment, the patient has consented to  Procedure(s) (LRB): IRRIGATION AND DEBRIDEMENT EXTREMITY (Left) with possible application of wound VAC as a surgical intervention .  The patient's history has been reviewed, patient examined, no change in status, stable for surgery.  I have reviewed the patient's chart and labs.  Questions were answered to the patient's satisfaction.     Toni Arthurs

## 2011-12-26 NOTE — Transfer of Care (Signed)
Immediate Anesthesia Transfer of Care Note  Patient: [name]  Procedure(s) Performed: Procedure(s) (LRB): IRRIGATION AND DEBRIDEMENT EXTREMITY (Left)  Patient Location: PACU  Anesthesia Type: General  Level of Consciousness: awake, alert  and oriented  Airway & Oxygen Therapy: Patient Spontanous Breathing and Patient connected to nasal cannula oxygen  Post-op Assessment: Report given to PACU RN and Post -op Vital signs reviewed and stable  Post vital signs: Reviewed and stable  Complications: No apparent anesthesia complications 

## 2011-12-26 NOTE — Brief Op Note (Signed)
12/23/2011 - 12/26/2011  4:28 PM  PATIENT:  [name]  56 y.o. female  PRE-OPERATIVE DIAGNOSIS:  Left foot diabetic foot infection s/p irrigation and debridement  POST-OPERATIVE DIAGNOSIS:  Same; open left 1st MTP  joint and exposed hallux proximal phalanx  Procedure(s): 1.  Irrigation and excisional debridement of left foot wound including skin, subcutaneous tissue, muscle and bone of multiple forefoot areas 2.  Application of wound VAC to left foot wound measuring 10 cm x 4 cm x 3 cm.  SURGEON:  Toni Arthurs, MD  ASSISTANT: n/a  ANESTHESIA:   General  EBL:  minimal   TOURNIQUET:  n/a  COMPLICATIONS:  None apparent  DISPOSITION:  Extubated, awake and stable to recovery.  DICTATION ID:  [id number]

## 2011-12-26 NOTE — Progress Notes (Signed)
Hypoglycemic Event  CBG: 53  Treatment: D50 IV 25 mL  Symptoms: Hungry  Follow-up CBG: Time:1238 CBG Result:129  Possible Reasons for Event: Inadequate meal intake  Comments/MD notified:    Kennyth Arnold D  Remember to initiate Hypoglycemia Order Set & complete

## 2011-12-26 NOTE — Progress Notes (Signed)
[facility] PROGRESS NOTE  [name] BJY:[id number] DOB: 13-Dec-1955 DOA: 12/23/2011 PCP: [name], MD  Assessment/Plan:  Diabetic foot ulcer Active Problems:  CVA (cerebral infarction)  Irritable bowel syndrome  DM type 2, uncontrolled, with neuropathy  Syncope  TIA (transient ischemic attack)  Cellulitis of left lower extremity  1. Diabetic foot ulcer:Pt to go back to surgery today for possible amputation versus placement of wound vac. 2. Diabetes type 2: Patient's blood sugars are currently well controlled we'll continue current regimen. 3. Recent CVA: Patient will continue on her Plavix on a daily basis. 4. Hyperlipidemia: Patient will continue on her simvastatin 5. Hypertension: Continue Prinivil.  Code Status: Full code Family Communication: Not applicable Disposition Plan: Not yet determined  [name],[name]  [facility] Pager [phone]. If 8PM-8AM, please contact night-coverage at [email or web address], password [email or web address] 12/26/2011, 8:07 PM  LOS: 3 days   Brief narrative: This is a patient with uncontrolled diabetes type 2 who presented to the emergency room with what appeared to be cellulitis of the left lower extremity. It turns out that this was an extensive diabetic fold ulcer. The patient underwent incision and drainage by Dr. Ranell Patrick and currently has an open wound that is wrapped. The patient is on IV antibiotics  Consultants:  Dr. Parke Simmers surgery  Procedures:  Status post incision and drainage 12/23/2011  Antibiotics:  Flagyl 8/6 >  Moxifloxacin > 8/6  Vancomycin > 8/6  HPI/Subjective: Patient is in good spirits but is concerned about the possibility of a partial amputation.  Objective: Filed Vitals:   12/26/11 1707 12/26/11 1715 12/26/11 1722 12/26/11 1734  BP: 174/82  169/68 159/74  Pulse:  70 69 70  Temp:    97.9 F (36.6 C)  TempSrc:      Resp:  19 16 16   Height:      Weight:      SpO2:  100% 100% 100%   Weight  change:   Intake/Output Summary (Last 24 hours) at 12/26/11 2007 Last data filed at 12/26/11 1643  Gross per 24 hour  Intake 1866.25 ml  Output     10 ml  Net 1856.25 ml    General: Alert, awake, oriented x3, in no acute distress.  Heart: Regular rate and rhythm, without murmurs, rubs, gallops.  Lungs: Clear to auscultation. No wheezing or rhonchi noted Neuro: No focal neurological deficits noted cranial nerves II through XII grossly intact.  Musculoskeletal: No warm swelling or erythema around joints. Left foot has no significant drainage and has markedly decreased erythema is noted 2 days ago. She has good distal pulses and capillary refill is less than 3 seconds   Data Reviewed: Basic Metabolic Panel:  Lab 12/25/11 6962 12/23/11 1315 12/23/11 1048  NA 137 -- 137  K 3.4* -- 3.5  CL 99 -- 99  CO2 25 -- 25  GLUCOSE 237* -- 193*  BUN 10 -- 19  CREATININE 0.99 0.86 0.90  CALCIUM 8.8 -- 9.0  MG -- -- --  PHOS -- -- --   Liver Function Tests:  Lab 12/25/11 1949  AST 34  ALT 40*  ALKPHOS 181*  BILITOT 0.3  PROT 7.5  ALBUMIN 2.6*   No results found for this basename: LIPASE:5,AMYLASE:5 in the last 168 hours No results found for this basename: AMMONIA:5 in the last 168 hours CBC:  Lab 12/25/11 1949 12/24/11 0538 12/23/11 1607 12/23/11 1315 12/23/11 1048  WBC 12.9* 15.7* 18.3* 19.8* 22.3*  NEUTROABS -- 13.0* -- -- --  HGB 11.3* 9.9* 11.4*  11.3* 11.9*  HCT 34.0* 30.6* 34.0* 33.6* 35.6*  MCV 84.6 85.2 83.7 83.6 84.0  PLT 268 206 227 225 251   Cardiac Enzymes: No results found for this basename: CKTOTAL:5,CKMB:5,CKMBINDEX:5,TROPONINI:5 in the last 168 hours BNP (last 3 results) No results found for this basename: PROBNP:3 in the last 8760 hours CBG:  Lab 12/26/11 1659 12/26/11 1201 12/26/11 0732 12/25/11 2141 12/25/11 1719  GLUCAP 115* 53* 217* 192* 188*    Recent Results (from the past 240 hour(s))  SURGICAL PCR SCREEN     Status: Normal   Collection Time    12/23/11  7:43 PM      Component Value Range Status Comment   MRSA, PCR NEGATIVE  NEGATIVE Final    Staphylococcus aureus NEGATIVE  NEGATIVE Final   AFB CULTURE WITH SMEAR     Status: Normal (Preliminary result)   Collection Time   12/23/11 11:05 PM      Component Value Range Status Comment   Specimen Description WOUND LEFT FOOT   Final    Special Requests NONE   Final    ACID FAST SMEAR NO ACID FAST BACILLI SEEN   Final    Culture     Final    Value: CULTURE WILL BE EXAMINED FOR 6 WEEKS BEFORE ISSUING A FINAL REPORT   Report Status PENDING   Incomplete   ANAEROBIC CULTURE     Status: Normal (Preliminary result)   Collection Time   12/23/11 11:05 PM      Component Value Range Status Comment   Specimen Description WOUND LEFT FOOT   Final    Special Requests NONE   Final    Gram Stain     Final    Value: FEW WBC PRESENT,BOTH PMN AND MONONUCLEAR     NO SQUAMOUS EPITHELIAL CELLS SEEN     ABUNDANT GRAM POSITIVE COCCI IN PAIRS   Culture     Final    Value: NO ANAEROBES ISOLATED; CULTURE IN PROGRESS FOR 5 DAYS   Report Status PENDING   Incomplete   WOUND CULTURE     Status: Normal   Collection Time   12/23/11 11:05 PM      Component Value Range Status Comment   Specimen Description WOUND LEFT FOOT   Final    Special Requests NONE   Final    Gram Stain     Final    Value: FEW WBC PRESENT,BOTH PMN AND MONONUCLEAR     NO SQUAMOUS EPITHELIAL CELLS SEEN     ABUNDANT GRAM POSITIVE COCCI IN PAIRS   Culture     Final    Value: MULTIPLE ORGANISMS PRESENT, NONE PREDOMINANT     Note: NO STAPHYLOCOCCUS AUREUS ISOLATED NO GROUP A STREP (S.PYOGENES) ISOLATED   Report Status 12/26/2011 FINAL   Final      Studies: Dg Chest 2 View  12/15/2011  *RADIOLOGY REPORT*  Clinical Data: Dizziness  CHEST - 2 VIEW  Comparison: 11/26/2011  Findings: Normal heart size.  Under aerated and grossly clear lungs.  No pneumothorax.  No pleural effusions.  Stable thoracic spine.  IMPRESSION: No active cardiopulmonary  disease.  Original Report Authenticated By: Donavan Burnet, M.D.   Dg Chest 2 View  11/26/2011  *RADIOLOGY REPORT*  Clinical Data: Shortness of breath and weakness.  CHEST - 2 VIEW  Comparison: PA and lateral chest 05/29/2011.  Findings: Lungs are clear.  Heart size is normal.  No pneumothorax or pleural fluid.  IMPRESSION: Negative chest.  Original Report Authenticated By:  THOMAS L. Maricela Curet, M.D.   Ct Head Wo Contrast  12/23/2011  *RADIOLOGY REPORT*  Clinical Data: Right frontal headaches  CT HEAD WITHOUT CONTRAST  Technique:  Contiguous axial images were obtained from the base of the skull through the vertex without contrast.  Comparison: July 01, 2011  Findings: The ventricles are normal in size, shape, and position. There is no mass effect or midline shift.  No acute hemorrhage or abnormal extra-axial fluid collections are identified.  The gray/white differentiation is normal.  The orbits, calvarium, visualized paranasal sinuses have a normal appearance.  IMPRESSION: Normal CT scan of the head with no evidence of acute intracranial abnormality.  Original Report Authenticated By: Brandon Melnick, M.D.   Ct Head Wo Contrast  12/15/2011  *RADIOLOGY REPORT*  Clinical Data: Dizziness.  Nausea.  Headache.  Left arm numbness.  CT HEAD WITHOUT CONTRAST  Technique:  Contiguous axial images were obtained from the base of the skull through the vertex without contrast.  Comparison: 07/02/2011  Findings: The brain shows generalized atrophy.  No sign of old or acute focal infarction, mass lesion, hemorrhage, hydrocephalus or extra-axial collection.  The calvarium is unremarkable.  Sinuses, middle ears and mastoids are clear.  IMPRESSION: Negative head CT.  Mild generalized atrophy.  No focal or acute finding.  Original Report Authenticated By: Thomasenia Sales, M.D.   Mri Brain Without Contrast  12/16/2011  *RADIOLOGY REPORT*  Clinical Data:  Left sided numbness.  Facial droop  MRI HEAD WITHOUT CONTRAST MRA HEAD  WITHOUT CONTRAST  Technique:  Multiplanar, multiecho pulse sequences of the brain and surrounding structures were obtained without intravenous contrast. Angiographic images of the head were obtained using MRA technique without contrast.  Comparison:  CT 12/15/2011  MRI HEAD  Findings:  Sub centimeter acute infarct in the right frontal white matter.  No other areas of acute infarct.  Mild chronic microvascular ischemic change in the white matter. Ventricle size is normal.  Negative for hemorrhage or mass. Paranasal sinuses are clear.  IMPRESSION: Acute infarct in the right frontal white matter adjacent to the frontal horn of the lateral ventricle.  MRA HEAD  Findings: Left vertebral artery is dominant.  Small right vertebral artery with a moderate stenosis distally.  Mild irregularity the basilar due to atherosclerotic disease.  PICA is patent bilaterally.  Superior cerebellar and posterior cerebral arteries are patent.  Fetal origin of the posterior cerebral artery bilaterally.  Basilar artery ends in the superior cerebellar arteries.  Internal carotid artery is patent bilaterally with mild atherosclerotic disease in the cavernous segment.  Anterior and middle cerebral arteries are patent bilaterally.  Mild irregularity of the middle cerebral artery branches bilaterally suggesting atherosclerotic disease.  Negative for cerebral aneurysm.  IMPRESSION: Mild to moderate intracranial atherosclerotic disease without large vessel occlusion.  Original Report Authenticated By: Camelia Phenes, M.D.   Dg Foot Complete Left  12/23/2011  *RADIOLOGY REPORT*  Clinical Data: Ulcer left foot.  LEFT FOOT - COMPLETE 3+ VIEW  Comparison: 09/04/2011.  Findings: There is soft tissue gas at the base of the left great toe and extending between the first and second metatarsals compatible with infection.  No radiographic changes of osteomyelitis at this time.  Old deformity and destruction of the left fifth phalanges, stable.  IMPRESSION:  Extensive soft tissue gas in the base of the left great toe and extending between the first and second metatarsals compatible with soft tissue infection.  No radiographic changes of acute osteomyelitis.  Original Report Authenticated By:  Cyndie Chime, M.D.   Mr Mra Head/brain Wo Cm  12/16/2011  *RADIOLOGY REPORT*  Clinical Data:  Left sided numbness.  Facial droop  MRI HEAD WITHOUT CONTRAST MRA HEAD WITHOUT CONTRAST  Technique:  Multiplanar, multiecho pulse sequences of the brain and surrounding structures were obtained without intravenous contrast. Angiographic images of the head were obtained using MRA technique without contrast.  Comparison:  CT 12/15/2011  MRI HEAD  Findings:  Sub centimeter acute infarct in the right frontal white matter.  No other areas of acute infarct.  Mild chronic microvascular ischemic change in the white matter. Ventricle size is normal.  Negative for hemorrhage or mass. Paranasal sinuses are clear.  IMPRESSION: Acute infarct in the right frontal white matter adjacent to the frontal horn of the lateral ventricle.  MRA HEAD  Findings: Left vertebral artery is dominant.  Small right vertebral artery with a moderate stenosis distally.  Mild irregularity the basilar due to atherosclerotic disease.  PICA is patent bilaterally.  Superior cerebellar and posterior cerebral arteries are patent.  Fetal origin of the posterior cerebral artery bilaterally.  Basilar artery ends in the superior cerebellar arteries.  Internal carotid artery is patent bilaterally with mild atherosclerotic disease in the cavernous segment.  Anterior and middle cerebral arteries are patent bilaterally.  Mild irregularity of the middle cerebral artery branches bilaterally suggesting atherosclerotic disease.  Negative for cerebral aneurysm.  IMPRESSION: Mild to moderate intracranial atherosclerotic disease without large vessel occlusion.  Original Report Authenticated By: Camelia Phenes, M.D.    Scheduled Meds:     . chlorhexidine  60 mL Topical Once  . clopidogrel  75 mg Oral Q breakfast  . dextrose      . dextrose      . glimepiride  4 mg Oral QAC breakfast  . insulin aspart  0-20 Units Subcutaneous TID WC  . insulin aspart  0-5 Units Subcutaneous QHS  . insulin aspart  6 Units Subcutaneous TID WC  . insulin glargine  25 Units Subcutaneous QHS  . lisinopril  20 mg Oral Daily  . metronidazole  500 mg Intravenous Q8H  . moxifloxacin  400 mg Intravenous Q24H  . simvastatin  40 mg Oral QPM  . DISCONTD: vancomycin  1,000 mg Intravenous Q12H   Continuous Infusions:    . sodium chloride 50 mL (12/25/11 0820)  . DISCONTD: sodium chloride 75 mL/hr at 12/26/11 0410    Principal Problem:  *Diabetic ulcer of left foot Active Problems:  HYPERLIPIDEMIA  HYPERTENSION  CVA (cerebral infarction)  Irritable bowel syndrome  DM type 2, uncontrolled, with neuropathy  Syncope  TIA (transient ischemic attack)  Cellulitis of left lower extremity

## 2011-12-26 NOTE — Anesthesia Procedure Notes (Signed)
Procedure Name: LMA Insertion Date/Time: 12/26/2011 3:44 PM Performed by: Margaree Mackintosh Pre-anesthesia Checklist: Patient identified, Timeout performed, Emergency Drugs available, Suction available and Patient being monitored Patient Re-evaluated:Patient Re-evaluated prior to inductionOxygen Delivery Method: Circle system utilized Preoxygenation: Pre-oxygenation with 100% oxygen Intubation Type: IV induction LMA: LMA with gastric port inserted LMA Size: 4.0 Number of attempts: 1 Placement Confirmation: positive ETCO2 and breath sounds checked- equal and bilateral Tube secured with: Tape Dental Injury: Teeth and Oropharynx as per pre-operative assessment

## 2011-12-26 NOTE — Anesthesia Preprocedure Evaluation (Signed)
Anesthesia Evaluation  Patient identified by MRN, date of birth, ID band Patient awake    Reviewed: Allergy & Precautions, H&P , NPO status , Patient's Chart, lab work & pertinent test results  Airway Mallampati: I TM Distance: >3 FB Neck ROM: Full    Dental  (+) Teeth Intact and Dental Advisory Given   Pulmonary  breath sounds clear to auscultation        Cardiovascular hypertension, Pt. on medications + CAD Rhythm:Regular Rate:Normal     Neuro/Psych    GI/Hepatic GERD-  Medicated and Controlled,  Endo/Other    Renal/GU      Musculoskeletal   Abdominal   Peds  Hematology   Anesthesia Other Findings   Reproductive/Obstetrics                           Anesthesia Physical Anesthesia Plan  ASA: III  Anesthesia Plan: General   Post-op Pain Management:    Induction: Intravenous  Airway Management Planned: LMA  Additional Equipment:   Intra-op Plan:   Post-operative Plan: Extubation in OR  Informed Consent: I have reviewed the patients History and Physical, chart, labs and discussed the procedure including the risks, benefits and alternatives for the proposed anesthesia with the patient or authorized representative who has indicated his/her understanding and acceptance.   Dental advisory given  Plan Discussed with: CRNA, Anesthesiologist and Surgeon  Anesthesia Plan Comments:         Anesthesia Quick Evaluation

## 2011-12-26 NOTE — Progress Notes (Signed)
Physical Therapy Treatment Patient Details Name: [name] MRN: [id number] DOB: February 12, 1956 Today's Date: 12/26/2011 Time: 4098-1191 PT Time Calculation (min): 10 min  PT Assessment / Plan / Recommendation Comments on Treatment Session  Improved gait with use of Darko shoe to keep weight on heel.  Improved activity tolerance, with increased distance with gait.    Follow Up Recommendations  Home health PT;Supervision/Assistance - 24 hour    Barriers to Discharge        Equipment Recommendations  3 in 1 bedside comode    Recommendations for Other Services    Frequency Min 5X/week   Plan Discharge plan remains appropriate;Frequency remains appropriate    Precautions / Restrictions Precautions Precautions: Fall Restrictions Weight Bearing Restrictions: Yes LLE Weight Bearing: Weight bearing as tolerated (weight through heel only)       Mobility  Transfers Transfers: Sit to Stand;Stand to Sit Sit to Stand: 4: Min guard;With upper extremity assist;From bed Stand to Sit: 4: Min guard;With upper extremity assist;To chair/3-in-1;With armrests Details for Transfer Assistance: Verbal cues for hand placement.  Cues to lower self slowly into chair. Ambulation/Gait Ambulation/Gait Assistance: 4: Min guard Ambulation Distance (Feet): 62 Feet Assistive device: Rolling walker Ambulation/Gait Assistance Details: Patient using Darko shoe to keep weight on heel of LLE.  Able to safely use RW. Gait Pattern: Step-to pattern;Decreased stance time - left;Trunk flexed      PT Goals Acute Rehab PT Goals PT Goal: Sit to Stand - Progress: Progressing toward goal PT Goal: Stand to Sit - Progress: Progressing toward goal PT Transfer Goal: Bed to Chair/Chair to Bed - Progress: Progressing toward goal PT Goal: Ambulate - Progress: Progressing toward goal  Visit Information  Last PT Received On: 12/26/11 Assistance Needed: +1    Subjective Data  Subjective: I've been moving around today.   Cognition  Overall Cognitive Status: Appears within functional limits for tasks assessed/performed Arousal/Alertness: Awake/alert Orientation Level: Appears intact for tasks assessed Behavior During Session: [facility] for tasks performed    Balance     End of Session PT - End of Session Equipment Utilized During Treatment: Gait belt (Darko shoe LLE) Activity Tolerance: Patient tolerated treatment well Patient left: in chair;with call bell/phone within reach Nurse Communication: Mobility status   GP     Vena Austria 12/26/2011, 6:16 PM Durenda Hurt. Renaldo Fiddler, [facility] Acute Rehab Services Pager [phone]

## 2011-12-26 NOTE — Progress Notes (Signed)
Inpatient Diabetes Program Recommendations  AACE/ADA: New Consensus Statement on Inpatient Glycemic Control (2013)  Target Ranges:  Prepandial:   less than 140 mg/dL      Peak postprandial:   less than 180 mg/dL (1-2 hours)      Critically ill patients:  140 - 180 mg/dL   Reason for Visit: Hyoglycemia Overall control is good, however, pt does have some hypoglycemic events which appear to be due to high correction doses.  Inpatient Diabetes Program Recommendations Correction (SSI): Pt on home insulin regimen in addition to resistant correction tidwc and HS scale. Please consider decrease to moderate correction tidwc.and the HS scale .  Note: Thank you, Lenor Coffin, RN, CNS, Diabetes Coordinator [phone])

## 2011-12-27 LAB — GLUCOSE, CAPILLARY
Glucose-Capillary: 170 mg/dL — ABNORMAL HIGH (ref 70–99)
Glucose-Capillary: 99 mg/dL (ref 70–99)
Glucose-Capillary: 63 mg/dL — ABNORMAL LOW (ref 70–99)
Glucose-Capillary: 60 mg/dL — ABNORMAL LOW (ref 70–99)
Glucose-Capillary: 72 mg/dL (ref 70–99)

## 2011-12-27 MED ORDER — POTASSIUM CHLORIDE CRYS ER 20 MEQ PO TBCR
40.0000 meq | EXTENDED_RELEASE_TABLET | Freq: Two times a day (BID) | ORAL | Status: DC
  Administered 2011-12-27 – 2011-12-31 (×7): 40 meq via ORAL
  Filled 2011-12-27 (×9): qty 2

## 2011-12-27 MED ORDER — INSULIN ASPART 100 UNIT/ML ~~LOC~~ SOLN
8.0000 [IU] | Freq: Three times a day (TID) | SUBCUTANEOUS | Status: DC
  Administered 2011-12-28 – 2011-12-31 (×6): 8 [IU] via SUBCUTANEOUS

## 2011-12-27 MED ORDER — INSULIN GLARGINE 100 UNIT/ML ~~LOC~~ SOLN
15.0000 [IU] | Freq: Once | SUBCUTANEOUS | Status: AC
  Administered 2011-12-27: 15 [IU] via SUBCUTANEOUS

## 2011-12-27 NOTE — Progress Notes (Signed)
Hypoglycemic Event  CBG: 60 @ 2144  Treatment: 15 GM carbohydrate snack  Symptoms: None  Follow-up CBG: Time:2212 CBG Result:72  Possible Reasons for Event: Unknown  Comments/MD notified:Provider Lynch contacted. Patient was given an extra 15 GM carbohydrate snack, and Lantus was decreased to 15 Lantus.     Heron Nay RN BSN  Remember to initiate Hypoglycemia Order Set & complete

## 2011-12-27 NOTE — Progress Notes (Signed)
[facility] PROGRESS NOTE  [name] BJY:[id number] DOB: 12-17-1955 DOA: 12/23/2011 PCP: [name], MD  Assessment/Plan:  Diabetic foot ulcer Active Problems:  CVA (cerebral infarction)  Irritable bowel syndrome  DM type 2, uncontrolled, with neuropathy  Syncope  TIA (transient ischemic attack)  Cellulitis of left lower extremity  1. Diabetic foot ulcer: Patient status post further debridement and placement of wound VAC on foot. Doing well. 2. Diabetes type 2: Patient's blood sugars are currently marginally elevated mostly around meals. I will increase meal coverage.  3. Recent CVA: Patient will continue on her Plavix on a daily basis. 4. Hyperlipidemia: Patient will continue on her simvastatin 5. Hypertension: Continue Prinivil. 6. Mild hypokalemia: Replaced orally  Code Status: Full code Family Communication: Not applicable Disposition Plan: Not yet determined  [name] A.  [facility] Pager [phone]. If 8PM-8AM, please contact night-coverage at [email or web address], password [facility] 12/27/2011, 6:44 PM  LOS: 4 days   Brief narrative: This is a patient with uncontrolled diabetes type 2 who presented to the emergency room with what appeared to be cellulitis of the left lower extremity. It turns out that this was an extensive diabetic fold ulcer. The patient underwent incision and drainage by Dr. Ranell Patrick and currently has an open wound that is wrapped. The patient is on IV antibiotics  Consultants:  Dr. Parke Simmers surgery  Procedures:  Status post incision and drainage 12/23/2011  Antibiotics:  Flagyl 8/6 >  Moxifloxacin > 8/6  Vancomycin > 8/6  HPI/Subjective: Patient is in good spirits.  Objective: Filed Vitals:   12/26/11 1734 12/26/11 2045 12/27/11 0516 12/27/11 1500  BP: 159/74 155/82 168/73 157/75  Pulse: 70 88 86 80  Temp: 97.9 F (36.6 C) 98.4 F (36.9 C) 98.7 F (37.1 C) 98.5 F (36.9 C)  TempSrc:   Oral Oral  Resp: 16  18 20 18   Height:      Weight:      SpO2: 100% 97% 96% 98%   Weight change:   Intake/Output Summary (Last 24 hours) at 12/27/11 1844 Last data filed at 12/27/11 1824  Gross per 24 hour  Intake   1090 ml  Output      0 ml  Net   1090 ml    General: Alert, awake, oriented x3, in no acute distress.  Heart: S1 and S2 normal  Lungs: Clear to auscultation.  Neuro: No focal neurological deficits noted cranial nerves II through XII grossly intact.  Musculoskeletal: No warm swelling or erythema around joints.She has good distal pulses and capillary refill is less than 3 seconds   Data Reviewed: Basic Metabolic Panel:  Lab 12/25/11 6962 12/23/11 1315 12/23/11 1048  NA 137 -- 137  K 3.4* -- 3.5  CL 99 -- 99  CO2 25 -- 25  GLUCOSE 237* -- 193*  BUN 10 -- 19  CREATININE 0.99 0.86 0.90  CALCIUM 8.8 -- 9.0  MG -- -- --  PHOS -- -- --   Liver Function Tests:  Lab 12/25/11 1949  AST 34  ALT 40*  ALKPHOS 181*  BILITOT 0.3  PROT 7.5  ALBUMIN 2.6*   No results found for this basename: LIPASE:5,AMYLASE:5 in the last 168 hours No results found for this basename: AMMONIA:5 in the last 168 hours CBC:  Lab 12/25/11 1949 12/24/11 0538 12/23/11 1607 12/23/11 1315 12/23/11 1048  WBC 12.9* 15.7* 18.3* 19.8* 22.3*  NEUTROABS -- 13.0* -- -- --  HGB 11.3* 9.9* 11.4* 11.3* 11.9*  HCT 34.0* 30.6* 34.0* 33.6* 35.6*  MCV  84.6 85.2 83.7 83.6 84.0  PLT 268 206 227 225 251   Cardiac Enzymes: No results found for this basename: CKTOTAL:5,CKMB:5,CKMBINDEX:5,TROPONINI:5 in the last 168 hours BNP (last 3 results) No results found for this basename: PROBNP:3 in the last 8760 hours CBG:  Lab 12/27/11 1702 12/27/11 1141 12/27/11 0804 12/26/11 2116 12/26/11 1659  GLUCAP 99 167* 170* 228* 115*    Recent Results (from the past 240 hour(s))  SURGICAL PCR SCREEN     Status: Normal   Collection Time   12/23/11  7:43 PM      Component Value Range Status Comment   MRSA, PCR NEGATIVE  NEGATIVE Final     Staphylococcus aureus NEGATIVE  NEGATIVE Final   AFB CULTURE WITH SMEAR     Status: Normal (Preliminary result)   Collection Time   12/23/11 11:05 PM      Component Value Range Status Comment   Specimen Description WOUND LEFT FOOT   Final    Special Requests NONE   Final    ACID FAST SMEAR NO ACID FAST BACILLI SEEN   Final    Culture     Final    Value: CULTURE WILL BE EXAMINED FOR 6 WEEKS BEFORE ISSUING A FINAL REPORT   Report Status PENDING   Incomplete   ANAEROBIC CULTURE     Status: Normal (Preliminary result)   Collection Time   12/23/11 11:05 PM      Component Value Range Status Comment   Specimen Description WOUND LEFT FOOT   Final    Special Requests NONE   Final    Gram Stain     Final    Value: FEW WBC PRESENT,BOTH PMN AND MONONUCLEAR     NO SQUAMOUS EPITHELIAL CELLS SEEN     ABUNDANT GRAM POSITIVE COCCI IN PAIRS   Culture     Final    Value: NO ANAEROBES ISOLATED; CULTURE IN PROGRESS FOR 5 DAYS   Report Status PENDING   Incomplete   WOUND CULTURE     Status: Normal   Collection Time   12/23/11 11:05 PM      Component Value Range Status Comment   Specimen Description WOUND LEFT FOOT   Final    Special Requests NONE   Final    Gram Stain     Final    Value: FEW WBC PRESENT,BOTH PMN AND MONONUCLEAR     NO SQUAMOUS EPITHELIAL CELLS SEEN     ABUNDANT GRAM POSITIVE COCCI IN PAIRS   Culture     Final    Value: MULTIPLE ORGANISMS PRESENT, NONE PREDOMINANT     Note: NO STAPHYLOCOCCUS AUREUS ISOLATED NO GROUP A STREP (S.PYOGENES) ISOLATED   Report Status 12/26/2011 FINAL   Final      Studies: Dg Chest 2 View  12/15/2011  *RADIOLOGY REPORT*  Clinical Data: Dizziness  CHEST - 2 VIEW  Comparison: 11/26/2011  Findings: Normal heart size.  Under aerated and grossly clear lungs.  No pneumothorax.  No pleural effusions.  Stable thoracic spine.  IMPRESSION: No active cardiopulmonary disease.  Original Report Authenticated By: Donavan Burnet, M.D.   Dg Chest 2 View  11/26/2011   *RADIOLOGY REPORT*  Clinical Data: Shortness of breath and weakness.  CHEST - 2 VIEW  Comparison: PA and lateral chest 05/29/2011.  Findings: Lungs are clear.  Heart size is normal.  No pneumothorax or pleural fluid.  IMPRESSION: Negative chest.  Original Report Authenticated By: Bernadene Bell. Maricela Curet, M.D.   Ct Head Wo Contrast  12/23/2011  *RADIOLOGY REPORT*  Clinical Data: Right frontal headaches  CT HEAD WITHOUT CONTRAST  Technique:  Contiguous axial images were obtained from the base of the skull through the vertex without contrast.  Comparison: July 01, 2011  Findings: The ventricles are normal in size, shape, and position. There is no mass effect or midline shift.  No acute hemorrhage or abnormal extra-axial fluid collections are identified.  The gray/white differentiation is normal.  The orbits, calvarium, visualized paranasal sinuses have a normal appearance.  IMPRESSION: Normal CT scan of the head with no evidence of acute intracranial abnormality.  Original Report Authenticated By: Brandon Melnick, M.D.   Ct Head Wo Contrast  12/15/2011  *RADIOLOGY REPORT*  Clinical Data: Dizziness.  Nausea.  Headache.  Left arm numbness.  CT HEAD WITHOUT CONTRAST  Technique:  Contiguous axial images were obtained from the base of the skull through the vertex without contrast.  Comparison: 07/02/2011  Findings: The brain shows generalized atrophy.  No sign of old or acute focal infarction, mass lesion, hemorrhage, hydrocephalus or extra-axial collection.  The calvarium is unremarkable.  Sinuses, middle ears and mastoids are clear.  IMPRESSION: Negative head CT.  Mild generalized atrophy.  No focal or acute finding.  Original Report Authenticated By: Thomasenia Sales, M.D.   Mri Brain Without Contrast  12/16/2011  *RADIOLOGY REPORT*  Clinical Data:  Left sided numbness.  Facial droop  MRI HEAD WITHOUT CONTRAST MRA HEAD WITHOUT CONTRAST  Technique:  Multiplanar, multiecho pulse sequences of the brain and surrounding  structures were obtained without intravenous contrast. Angiographic images of the head were obtained using MRA technique without contrast.  Comparison:  CT 12/15/2011  MRI HEAD  Findings:  Sub centimeter acute infarct in the right frontal white matter.  No other areas of acute infarct.  Mild chronic microvascular ischemic change in the white matter. Ventricle size is normal.  Negative for hemorrhage or mass. Paranasal sinuses are clear.  IMPRESSION: Acute infarct in the right frontal white matter adjacent to the frontal horn of the lateral ventricle.  MRA HEAD  Findings: Left vertebral artery is dominant.  Small right vertebral artery with a moderate stenosis distally.  Mild irregularity the basilar due to atherosclerotic disease.  PICA is patent bilaterally.  Superior cerebellar and posterior cerebral arteries are patent.  Fetal origin of the posterior cerebral artery bilaterally.  Basilar artery ends in the superior cerebellar arteries.  Internal carotid artery is patent bilaterally with mild atherosclerotic disease in the cavernous segment.  Anterior and middle cerebral arteries are patent bilaterally.  Mild irregularity of the middle cerebral artery branches bilaterally suggesting atherosclerotic disease.  Negative for cerebral aneurysm.  IMPRESSION: Mild to moderate intracranial atherosclerotic disease without large vessel occlusion.  Original Report Authenticated By: Camelia Phenes, M.D.   Dg Foot Complete Left  12/23/2011  *RADIOLOGY REPORT*  Clinical Data: Ulcer left foot.  LEFT FOOT - COMPLETE 3+ VIEW  Comparison: 09/04/2011.  Findings: There is soft tissue gas at the base of the left great toe and extending between the first and second metatarsals compatible with infection.  No radiographic changes of osteomyelitis at this time.  Old deformity and destruction of the left fifth phalanges, stable.  IMPRESSION: Extensive soft tissue gas in the base of the left great toe and extending between the first and  second metatarsals compatible with soft tissue infection.  No radiographic changes of acute osteomyelitis.  Original Report Authenticated By: Cyndie Chime, M.D.   Mr Mra Head/brain Wo Cm  12/16/2011  *RADIOLOGY REPORT*  Clinical Data:  Left sided numbness.  Facial droop  MRI HEAD WITHOUT CONTRAST MRA HEAD WITHOUT CONTRAST  Technique:  Multiplanar, multiecho pulse sequences of the brain and surrounding structures were obtained without intravenous contrast. Angiographic images of the head were obtained using MRA technique without contrast.  Comparison:  CT 12/15/2011  MRI HEAD  Findings:  Sub centimeter acute infarct in the right frontal white matter.  No other areas of acute infarct.  Mild chronic microvascular ischemic change in the white matter. Ventricle size is normal.  Negative for hemorrhage or mass. Paranasal sinuses are clear.  IMPRESSION: Acute infarct in the right frontal white matter adjacent to the frontal horn of the lateral ventricle.  MRA HEAD  Findings: Left vertebral artery is dominant.  Small right vertebral artery with a moderate stenosis distally.  Mild irregularity the basilar due to atherosclerotic disease.  PICA is patent bilaterally.  Superior cerebellar and posterior cerebral arteries are patent.  Fetal origin of the posterior cerebral artery bilaterally.  Basilar artery ends in the superior cerebellar arteries.  Internal carotid artery is patent bilaterally with mild atherosclerotic disease in the cavernous segment.  Anterior and middle cerebral arteries are patent bilaterally.  Mild irregularity of the middle cerebral artery branches bilaterally suggesting atherosclerotic disease.  Negative for cerebral aneurysm.  IMPRESSION: Mild to moderate intracranial atherosclerotic disease without large vessel occlusion.  Original Report Authenticated By: Camelia Phenes, M.D.    Scheduled Meds:    . clopidogrel  75 mg Oral Q breakfast  . dextrose      . glimepiride  4 mg Oral QAC  breakfast  . insulin aspart  0-20 Units Subcutaneous TID WC  . insulin aspart  0-5 Units Subcutaneous QHS  . insulin aspart  6 Units Subcutaneous TID WC  . insulin glargine  25 Units Subcutaneous QHS  . lisinopril  20 mg Oral Daily  . metronidazole  500 mg Intravenous Q8H  . moxifloxacin  400 mg Intravenous Q24H  . simvastatin  40 mg Oral QPM   Continuous Infusions:    . sodium chloride 50 mL/hr at 12/27/11 1730    Principal Problem:  *Diabetic ulcer of left foot Active Problems:  HYPERLIPIDEMIA  HYPERTENSION  CVA (cerebral infarction)  Irritable bowel syndrome  DM type 2, uncontrolled, with neuropathy  Syncope  TIA (transient ischemic attack)  Cellulitis of left lower extremity

## 2011-12-27 NOTE — Progress Notes (Signed)
Agree with above.  JDH 

## 2011-12-27 NOTE — Progress Notes (Signed)
Subjective: Pt reports some soreness of left foot, no other complaints   Objective: Vital signs in last 24 hours: Temp:  [97.7 F (36.5 C)-98.7 F (37.1 C)] 98.7 F (37.1 C) (08/10 0516) Pulse Rate:  [69-88] 86  (08/10 0516) Resp:  [14-20] 20  (08/10 0516) BP: (155-174)/(65-86) 168/73 mmHg (08/10 0516) SpO2:  [96 %-100 %] 96 % (08/10 0516)  Intake/Output from previous day: 08/09 0701 - 08/10 0700 In: 700 [I.V.:700] Out: 10 [Blood:10] Intake/Output this shift:     Basename 12/25/11 1949  HGB 11.3*    Basename 12/25/11 1949  WBC 12.9*  RBC 4.02  HCT 34.0*  PLT 268    Basename 12/25/11 1949  NA 137  K 3.4*  CL 99  CO2 25  BUN 10  CREATININE 0.99  GLUCOSE 237*  CALCIUM 8.8   No results found for this basename: LABPT:2,INR:2 in the last 72 hours  Cons and alert on no distress female, left foot dressing clean and dry, wound vac intact at suction, toes with good sense  Assessment/Plan: S/P I+D and wound vac application of diabetic foot ulcer stable and intact.  Plan no change in current treatment.   Jamelle Rushing 12/27/2011, 10:35 AM

## 2011-12-27 NOTE — Op Note (Signed)
[name], [name] NO.:  [id number]  MEDICAL RECORD NO.:  [id number]  LOCATION:  5524                         FACILITY:  [facility]  PHYSICIAN:  Toni Arthurs, MD        DATE OF BIRTH:  [birth year]  DATE OF PROCEDURE:  12/26/2011 DATE OF DISCHARGE:                              OPERATIVE REPORT   PREOPERATIVE DIAGNOSIS:  Left foot diabetic foot infection, status post irrigation and debridement.  POSTOPERATIVE DIAGNOSES: 1. Left foot diabetic foot wound. 2. Open left first metatarsophalangeal joint with exposed hallux     proximal phalanx.  PROCEDURE: 1. Irrigation and excisional debridement of left foot wound including     skin, subcutaneous tissue, muscle, and bone over multiple areas of     the forefoot. 2. Application of wound VAC to left foot wound measuring 10 cm x 4 cm     x 3 cm.  SURGEON:  Toni Arthurs, MD  ANESTHESIA:  General.  ESTIMATED BLOOD LOSS:  Minimal.  TOURNIQUET TIME:  Zero.  COMPLICATIONS:  None apparent.  DISPOSITION:  Extubated awake and stable to recovery.  INDICATIONS FOR PROCEDURE:  The patient is a 56 year old female with past medical history significant for type 2 diabetes.  She was seen upon admission earlier this week by Dr. Ranell Patrick, and taken to the operating room later that day.  She underwent irrigation and debridement of a large left foot abscess.  The resultant wound has been undergoing dressing changes.  She presents now for a planned return to the operating room for repeat irrigation and excisional debridement.  She understands the risks and benefits of the alternative treatment options including risks of bleeding, infection, nerve damage, blood clots, need for additional surgery, amputation, and death.  She elects to proceed.  PROCEDURE IN DETAIL:  After preoperative consent was obtained and the correct operative site was identified, the patient was brought to the operating room and placed supine on the operating  table.  General anesthesia was induced.  The patient was already on therapeutic antibiotics.  A surgical time-out was taken.  The left lower extremity was prepped and draped in standard sterile fashion.  The wound was identified on the dorsum of the foot at the first web space.  It was noted to have necrotic skin edges circumferentially with undermining of the proximal end of the wound by several centimeters.  The fascia was intact, but there was necrosis of the dorsal joint capsule and the periosteum of the base of the proximal phalanx.  There was significant purulent material at the base of the wound.  The wound extended to the plantar surface of the foot where more purulent tissue was noted with significant undermining and liquefaction necrosis of the plantar fat pad.  The wound was then excisionally debrided in circumferential fashion from the level of the skin down to the level of the bone.  The necrotic material was all sharply excised with a scalpel, rongeur and curette.  This was carried all the way down to the level of the bone. Once all of the necrotic material was excised, the wound was irrigated copiously with 3 liters of normal saline.  A second round of debridement  was then carried out again sharply using a scalpel, rongeur and curette. At this point, the wound was irrigated with another liter of fluid.  The remaining tissue appeared viable with the exception of the hallux.  The proximal phalanx of the hallux was necrotic.  The MTP joint was now opened.  The skin of the medial aspect of the forefoot though appeared generally healthy.  The wound was carefully dried.  A wound VAC sponge was cut to fit the wound, which now measured 4 cm wide x 3 cm deep. This included the entire first web space both dorsally and plantarly, and extended proximally several more centimeters.  The occlusive wound VAC dressing was then applied over the sponge and 125 mmHg vacuum was applied.   Appropriate seal was noted.  A Webril and Ace wrap were wrapped over the dressing.  The patient was then awakened from anesthesia, transported to the recovery room in stable condition.  FOLLOWUP PLAN:  The patient will need to return to the operating room in a few days for repeat I and D, and probable conversion to a first-ray amputation.     Toni Arthurs, MD     JH/MEDQ  D:  12/26/2011  T:  12/27/2011  Job:  [id number]

## 2011-12-27 NOTE — Progress Notes (Signed)
Hypoglycemic Event  CBG: 63 @ 2109  Treatment: 15 GM carbohydrate snack  Symptoms: None  Follow-up CBG: Time:2144 CBG Result:60  Possible Reasons for Event: Unknown  Comments/MD notified:Patient did not eat 15 gm carbohydrate until 2130 blood sugar checked 15 minutes after that.     [name]  Remember to initiate Hypoglycemia Order Set & complete

## 2011-12-28 DIAGNOSIS — E11649 Type 2 diabetes mellitus with hypoglycemia without coma: Secondary | ICD-10-CM | POA: Diagnosis not present

## 2011-12-28 LAB — BASIC METABOLIC PANEL
Calcium: 8.3 mg/dL — ABNORMAL LOW (ref 8.4–10.5)
GFR calc Af Amer: 89 mL/min — ABNORMAL LOW (ref >90)
GFR calc non Af Amer: 77 mL/min — ABNORMAL LOW (ref >90)
Sodium: 140 mEq/L (ref 135–145)

## 2011-12-28 LAB — ANAEROBIC CULTURE

## 2011-12-28 MED ORDER — INSULIN GLARGINE 100 UNIT/ML ~~LOC~~ SOLN
10.0000 [IU] | Freq: Every day | SUBCUTANEOUS | Status: DC
  Administered 2011-12-28 – 2011-12-30 (×3): 10 [IU] via SUBCUTANEOUS

## 2011-12-28 MED ORDER — INSULIN ASPART 100 UNIT/ML ~~LOC~~ SOLN
5.0000 [IU] | Freq: Once | SUBCUTANEOUS | Status: AC
  Administered 2011-12-28: 5 [IU] via SUBCUTANEOUS

## 2011-12-28 NOTE — Progress Notes (Signed)
Patient ID: [name], female   DOB: Sep 23, 1955, 56 y.o.   MRN: [id number] Subjective: 2 Days Post-Op Procedure(s) (LRB): IRRIGATION AND DEBRIDEMENT EXTREMITY (Left)    Patient reports pain as mild.  Objective:   VITALS:   Filed Vitals:   12/28/11 0529  BP: 166/83  Pulse: 78  Temp: 98.6 F (37 C)  Resp: 18   Exam: Sitting at bedside this am comfortable Wound vac and overlying ace wrap dry and functioning  LABS  Basename 12/25/11 1949  HGB 11.3*  HCT 34.0*  WBC 12.9*  PLT 268     Basename 12/28/11 0515 12/25/11 1949  NA 140 137  K 3.4* 3.4*  BUN 8 10  CREATININE 0.83 0.99  GLUCOSE 134* 237*    No results found for this basename: LABPT:2,INR:2 in the last 72 hours   Assessment/Plan: 2 Days Post-Op Procedure(s) (LRB): IRRIGATION AND DEBRIDEMENT EXTREMITY (Left)  Plan: Per Dr. Victorino Dike with regards to her left foot, no notes to indicate immediate plan but will probably included vac changes beginning tomorrow versus repeat I&D IV antibiotics

## 2011-12-28 NOTE — Progress Notes (Signed)
[facility] PROGRESS NOTE  MERARY GARGUILO JYN:[id number] DOB: 04-Jan-1956 DOA: 12/23/2011 PCP: [name], MD  Assessment/Plan:  Diabetic foot ulcer Active Problems:  CVA (cerebral infarction)  Irritable bowel syndrome  DM type 2, uncontrolled, with neuropathy  Syncope  TIA (transient ischemic attack)  Cellulitis of left lower extremity  1. Diabetic foot ulcer: Patient status post further debridement and placement of wound VAC on foot. Orthopedic input noted. 2. Diabetes type 2: Pt has had several episodes of hypoglycemia. I will decrease her Lantus to 10 units subcutaneous at bedtime and further monitor blood sugars. 3. Recent CVA: Patient will continue on her Plavix on a daily basis. 4. Hyperlipidemia: Patient will continue on her simvastatin 5. Hypertension: Continue Prinivil. 6. Mild hypokalemia: Replaced orally  Code Status: Full code Family Communication: Not applicable Disposition Plan: Not yet determined  [name],[name]  [facility] Pager [phone]. If 8PM-8AM, please contact night-coverage at [email or web address], password [facility] 12/28/2011, 7:42 PM  LOS: 5 days   Brief narrative: This is a patient with uncontrolled diabetes type 2 who presented to the emergency room with what appeared to be cellulitis of the left lower extremity. It turns out that this was an extensive diabetic fold ulcer. The patient underwent incision and drainage by Dr. Ranell Patrick and currently has an open wound that is wrapped. The patient is on IV antibiotics  Consultants:  Dr. Parke Simmers surgery  Procedures:  Status post incision and drainage 12/23/2011  Antibiotics:  Flagyl 8/6 >  Moxifloxacin  8/6 >  Vancomycin 8/6 >  HPI/Subjective: Patient is in good spirits.  Objective: Filed Vitals:   12/27/11 2100 12/28/11 0529 12/28/11 1053 12/28/11 1459  BP: 175/80 166/83 188/102 130/77  Pulse: 73 78 78 78  Temp: 98.6 F (37 C) 98.6 F (37 C)  97.6 F (36.4 C)  TempSrc:  Oral Oral  Oral  Resp: 20 18  16   Height:      Weight:      SpO2: 98% 97%  98%   Weight change:   Intake/Output Summary (Last 24 hours) at 12/28/11 1942 Last data filed at 12/28/11 1300  Gross per 24 hour  Intake    600 ml  Output      0 ml  Net    600 ml    General: Alert, awake, oriented x3, in no acute distress.  Heart: S1 and S2 normal  Lungs: Clear to auscultation.  Neuro: No focal neurological deficits noted cranial nerves II through XII grossly intact.  Musculoskeletal: No warm swelling or erythema around joints.She has good distal pulses and capillary refill is less than 3 seconds   Data Reviewed: Basic Metabolic Panel:  Lab 12/28/11 9629 12/25/11 1949 12/23/11 1315 12/23/11 1048  NA 140 137 -- 137  K 3.4* 3.4* -- 3.5  CL 102 99 -- 99  CO2 26 25 -- 25  GLUCOSE 134* 237* -- 193*  BUN 8 10 -- 19  CREATININE 0.83 0.99 0.86 0.90  CALCIUM 8.3* 8.8 -- 9.0  MG -- -- -- --  PHOS -- -- -- --   Liver Function Tests:  Lab 12/25/11 1949  AST 34  ALT 40*  ALKPHOS 181*  BILITOT 0.3  PROT 7.5  ALBUMIN 2.6*   No results found for this basename: LIPASE:5,AMYLASE:5 in the last 168 hours No results found for this basename: AMMONIA:5 in the last 168 hours CBC:  Lab 12/25/11 1949 12/24/11 0538 12/23/11 1607 12/23/11 1315 12/23/11 1048  WBC 12.9* 15.7* 18.3* 19.8* 22.3*  NEUTROABS --  13.0* -- -- --  HGB 11.3* 9.9* 11.4* 11.3* 11.9*  HCT 34.0* 30.6* 34.0* 33.6* 35.6*  MCV 84.6 85.2 83.7 83.6 84.0  PLT 268 206 227 225 251   Cardiac Enzymes: No results found for this basename: CKTOTAL:5,CKMB:5,CKMBINDEX:5,TROPONINI:5 in the last 168 hours BNP (last 3 results) No results found for this basename: PROBNP:3 in the last 8760 hours CBG:  Lab 12/28/11 1702 12/28/11 1213 12/28/11 0759 12/27/11 2212 12/27/11 2144  GLUCAP 123* 149* 138* 72 60*    Recent Results (from the past 240 hour(s))  SURGICAL PCR SCREEN     Status: Normal   Collection Time   12/23/11  7:43 PM       Component Value Range Status Comment   MRSA, PCR NEGATIVE  NEGATIVE Final    Staphylococcus aureus NEGATIVE  NEGATIVE Final   AFB CULTURE WITH SMEAR     Status: Normal (Preliminary result)   Collection Time   12/23/11 11:05 PM      Component Value Range Status Comment   Specimen Description WOUND LEFT FOOT   Final    Special Requests NONE   Final    ACID FAST SMEAR NO ACID FAST BACILLI SEEN   Final    Culture     Final    Value: CULTURE WILL BE EXAMINED FOR 6 WEEKS BEFORE ISSUING A FINAL REPORT   Report Status PENDING   Incomplete   ANAEROBIC CULTURE     Status: Normal   Collection Time   12/23/11 11:05 PM      Component Value Range Status Comment   Specimen Description WOUND LEFT FOOT   Final    Special Requests NONE   Final    Gram Stain     Final    Value: FEW WBC PRESENT,BOTH PMN AND MONONUCLEAR     NO SQUAMOUS EPITHELIAL CELLS SEEN     ABUNDANT GRAM POSITIVE COCCI IN PAIRS   Culture NO ANAEROBES ISOLATED   Final    Report Status 12/28/2011 FINAL   Final   WOUND CULTURE     Status: Normal   Collection Time   12/23/11 11:05 PM      Component Value Range Status Comment   Specimen Description WOUND LEFT FOOT   Final    Special Requests NONE   Final    Gram Stain     Final    Value: FEW WBC PRESENT,BOTH PMN AND MONONUCLEAR     NO SQUAMOUS EPITHELIAL CELLS SEEN     ABUNDANT GRAM POSITIVE COCCI IN PAIRS   Culture     Final    Value: MULTIPLE ORGANISMS PRESENT, NONE PREDOMINANT     Note: NO STAPHYLOCOCCUS AUREUS ISOLATED NO GROUP A STREP (S.PYOGENES) ISOLATED   Report Status 12/26/2011 FINAL   Final      Studies: Dg Chest 2 View  12/15/2011  *RADIOLOGY REPORT*  Clinical Data: Dizziness  CHEST - 2 VIEW  Comparison: 11/26/2011  Findings: Normal heart size.  Under aerated and grossly clear lungs.  No pneumothorax.  No pleural effusions.  Stable thoracic spine.  IMPRESSION: No active cardiopulmonary disease.  Original Report Authenticated By: Donavan Burnet, M.D.   Dg Chest 2  View  11/26/2011  *RADIOLOGY REPORT*  Clinical Data: Shortness of breath and weakness.  CHEST - 2 VIEW  Comparison: PA and lateral chest 05/29/2011.  Findings: Lungs are clear.  Heart size is normal.  No pneumothorax or pleural fluid.  IMPRESSION: Negative chest.  Original Report Authenticated By: Bernadene Bell. D'ALESSIO,  M.D.   Ct Head Wo Contrast  12/23/2011  *RADIOLOGY REPORT*  Clinical Data: Right frontal headaches  CT HEAD WITHOUT CONTRAST  Technique:  Contiguous axial images were obtained from the base of the skull through the vertex without contrast.  Comparison: July 01, 2011  Findings: The ventricles are normal in size, shape, and position. There is no mass effect or midline shift.  No acute hemorrhage or abnormal extra-axial fluid collections are identified.  The gray/white differentiation is normal.  The orbits, calvarium, visualized paranasal sinuses have a normal appearance.  IMPRESSION: Normal CT scan of the head with no evidence of acute intracranial abnormality.  Original Report Authenticated By: Brandon Melnick, M.D.   Ct Head Wo Contrast  12/15/2011  *RADIOLOGY REPORT*  Clinical Data: Dizziness.  Nausea.  Headache.  Left arm numbness.  CT HEAD WITHOUT CONTRAST  Technique:  Contiguous axial images were obtained from the base of the skull through the vertex without contrast.  Comparison: 07/02/2011  Findings: The brain shows generalized atrophy.  No sign of old or acute focal infarction, mass lesion, hemorrhage, hydrocephalus or extra-axial collection.  The calvarium is unremarkable.  Sinuses, middle ears and mastoids are clear.  IMPRESSION: Negative head CT.  Mild generalized atrophy.  No focal or acute finding.  Original Report Authenticated By: Thomasenia Sales, M.D.   Mri Brain Without Contrast  12/16/2011  *RADIOLOGY REPORT*  Clinical Data:  Left sided numbness.  Facial droop  MRI HEAD WITHOUT CONTRAST MRA HEAD WITHOUT CONTRAST  Technique:  Multiplanar, multiecho pulse sequences of the  brain and surrounding structures were obtained without intravenous contrast. Angiographic images of the head were obtained using MRA technique without contrast.  Comparison:  CT 12/15/2011  MRI HEAD  Findings:  Sub centimeter acute infarct in the right frontal white matter.  No other areas of acute infarct.  Mild chronic microvascular ischemic change in the white matter. Ventricle size is normal.  Negative for hemorrhage or mass. Paranasal sinuses are clear.  IMPRESSION: Acute infarct in the right frontal white matter adjacent to the frontal horn of the lateral ventricle.  MRA HEAD  Findings: Left vertebral artery is dominant.  Small right vertebral artery with a moderate stenosis distally.  Mild irregularity the basilar due to atherosclerotic disease.  PICA is patent bilaterally.  Superior cerebellar and posterior cerebral arteries are patent.  Fetal origin of the posterior cerebral artery bilaterally.  Basilar artery ends in the superior cerebellar arteries.  Internal carotid artery is patent bilaterally with mild atherosclerotic disease in the cavernous segment.  Anterior and middle cerebral arteries are patent bilaterally.  Mild irregularity of the middle cerebral artery branches bilaterally suggesting atherosclerotic disease.  Negative for cerebral aneurysm.  IMPRESSION: Mild to moderate intracranial atherosclerotic disease without large vessel occlusion.  Original Report Authenticated By: Camelia Phenes, M.D.   Dg Foot Complete Left  12/23/2011  *RADIOLOGY REPORT*  Clinical Data: Ulcer left foot.  LEFT FOOT - COMPLETE 3+ VIEW  Comparison: 09/04/2011.  Findings: There is soft tissue gas at the base of the left great toe and extending between the first and second metatarsals compatible with infection.  No radiographic changes of osteomyelitis at this time.  Old deformity and destruction of the left fifth phalanges, stable.  IMPRESSION: Extensive soft tissue gas in the base of the left great toe and extending  between the first and second metatarsals compatible with soft tissue infection.  No radiographic changes of acute osteomyelitis.  Original Report Authenticated By: Cyndie Chime,  M.D.   Mr Maxine Glenn Head/brain Wo Cm  12/16/2011  *RADIOLOGY REPORT*  Clinical Data:  Left sided numbness.  Facial droop  MRI HEAD WITHOUT CONTRAST MRA HEAD WITHOUT CONTRAST  Technique:  Multiplanar, multiecho pulse sequences of the brain and surrounding structures were obtained without intravenous contrast. Angiographic images of the head were obtained using MRA technique without contrast.  Comparison:  CT 12/15/2011  MRI HEAD  Findings:  Sub centimeter acute infarct in the right frontal white matter.  No other areas of acute infarct.  Mild chronic microvascular ischemic change in the white matter. Ventricle size is normal.  Negative for hemorrhage or mass. Paranasal sinuses are clear.  IMPRESSION: Acute infarct in the right frontal white matter adjacent to the frontal horn of the lateral ventricle.  MRA HEAD  Findings: Left vertebral artery is dominant.  Small right vertebral artery with a moderate stenosis distally.  Mild irregularity the basilar due to atherosclerotic disease.  PICA is patent bilaterally.  Superior cerebellar and posterior cerebral arteries are patent.  Fetal origin of the posterior cerebral artery bilaterally.  Basilar artery ends in the superior cerebellar arteries.  Internal carotid artery is patent bilaterally with mild atherosclerotic disease in the cavernous segment.  Anterior and middle cerebral arteries are patent bilaterally.  Mild irregularity of the middle cerebral artery branches bilaterally suggesting atherosclerotic disease.  Negative for cerebral aneurysm.  IMPRESSION: Mild to moderate intracranial atherosclerotic disease without large vessel occlusion.  Original Report Authenticated By: Camelia Phenes, M.D.    Scheduled Meds:    . clopidogrel  75 mg Oral Q breakfast  . glimepiride  4 mg Oral QAC  breakfast  . insulin aspart  0-20 Units Subcutaneous TID WC  . insulin aspart  0-5 Units Subcutaneous QHS  . insulin aspart  5 Units Subcutaneous Once  . insulin aspart  8 Units Subcutaneous TID WC  . insulin glargine  10 Units Subcutaneous QHS  . insulin glargine  15 Units Subcutaneous Once  . lisinopril  20 mg Oral Daily  . metronidazole  500 mg Intravenous Q8H  . moxifloxacin  400 mg Intravenous Q24H  . potassium chloride  40 mEq Oral BID  . simvastatin  40 mg Oral QPM  . DISCONTD: insulin glargine  25 Units Subcutaneous QHS   Continuous Infusions:    . sodium chloride 50 mL/hr at 12/28/11 0359    Principal Problem:  *Diabetic ulcer of left foot Active Problems:  HYPERLIPIDEMIA  HYPERTENSION  CVA (cerebral infarction)  Irritable bowel syndrome  DM type 2, uncontrolled, with neuropathy  Syncope  TIA (transient ischemic attack)  Cellulitis of left lower extremity

## 2011-12-29 ENCOUNTER — Encounter (HOSPITAL_COMMUNITY): Payer: Self-pay | Admitting: Orthopedic Surgery

## 2011-12-29 LAB — GLUCOSE, CAPILLARY
Glucose-Capillary: 129 mg/dL — ABNORMAL HIGH (ref 70–99)
Glucose-Capillary: 155 mg/dL — ABNORMAL HIGH (ref 70–99)
Glucose-Capillary: 180 mg/dL — ABNORMAL HIGH (ref 70–99)
Glucose-Capillary: 132 mg/dL — ABNORMAL HIGH (ref 70–99)

## 2011-12-29 MED ORDER — FENTANYL CITRATE 0.05 MG/ML IJ SOLN: 50.0000 ug | INTRAMUSCULAR | Status: DC | PRN

## 2011-12-29 MED ORDER — CHLORHEXIDINE GLUCONATE 4 % EX LIQD
60.0000 mL | Freq: Once | CUTANEOUS | Status: AC
  Administered 2011-12-29: 4 via TOPICAL
  Filled 2011-12-29: qty 60

## 2011-12-29 MED ORDER — MIDAZOLAM HCL 2 MG/2ML IJ SOLN: 1.0000 mg | INTRAMUSCULAR | Status: DC | PRN

## 2011-12-29 MED ORDER — SODIUM CHLORIDE 0.9 % IV SOLN
INTRAVENOUS | Status: DC
  Administered 2011-12-30: via INTRAVENOUS

## 2011-12-29 NOTE — Anesthesia Postprocedure Evaluation (Signed)
  Anesthesia Post-op Note  Patient: [name]  Procedure(s) Performed: Procedure(s) (LRB): IRRIGATION AND DEBRIDEMENT EXTREMITY (Left)  Patient Location: PACU  Anesthesia Type: General  Level of Consciousness: awake, alert  and oriented  Airway and Oxygen Therapy: Patient Spontanous Breathing and Patient connected to nasal cannula oxygen  Post-op Pain: mild  Post-op Assessment: Post-op Vital signs reviewed  Post-op Vital Signs: Reviewed  Complications: No apparent anesthesia complications 

## 2011-12-29 NOTE — Progress Notes (Signed)
Physical Therapy Treatment Patient Details Name: [name] MRN: [id number] DOB: 1956/02/22 Today's Date: 12/29/2011 Time: 4098-1191 PT Time Calculation (min): 18 min  PT Assessment / Plan / Recommendation Comments on Treatment Session  Pt with multiple I&D to Left foot with planned toe amp tomorrow. Wound vac in place as well as Darco pt able to [employer] with supervision and cueing. Pt with increased gait and mobility and encouraged to continue increasing mobility with staff assist.     Follow Up Recommendations  Home health PT;Supervision - Intermittent    Barriers to Discharge        Equipment Recommendations       Recommendations for Other Services    Frequency     Plan Discharge plan remains appropriate;Frequency remains appropriate    Precautions / Restrictions Precautions Precautions: Fall Restrictions LLE Weight Bearing: Weight bearing as tolerated LLE Partial Weight Bearing Percentage or Pounds: WBAT on heel only Other Position/Activity Restrictions: Darco shoe LLE   Pertinent Vitals/Pain 8/10 pain, RN notified and medicated pt    Mobility  Bed Mobility Bed Mobility: Not assessed Transfers Transfers: Sit to Stand;Stand to Sit Sit to Stand: 6: Modified independent (Device/Increase time) Stand to Sit: 5: Supervision;With armrests Details for Transfer Assistance: cueing for use of both armrests to control descent Ambulation/Gait Ambulation/Gait Assistance: 5: Supervision Ambulation Distance (Feet): 150 Feet Assistive device: Rolling walker Ambulation/Gait Assistance Details: cueing to step into RW, maintained appropriate use of Darco and weight bearing Gait Pattern: Step-to pattern;Trunk flexed Gait velocity: decreased Stairs: Yes Stairs Assistance: 5: Supervision Stairs Assistance Details (indicate cue type and reason): cueing for sequence with use of RW Stair Management Technique: Backwards;With walker Number of Stairs: 1     Exercises     PT  Diagnosis:    PT Problem List:   PT Treatment Interventions:     PT Goals Acute Rehab PT Goals PT Goal: Sit to Stand - Progress: Met Pt will go Stand to Sit: with modified independence PT Goal: Stand to Sit - Progress: Updated due to goals met PT Transfer Goal: Bed to Chair/Chair to Bed - Progress: Progressing toward goal Pt will Ambulate: >150 feet;with modified independence;with least restrictive assistive device PT Goal: Ambulate - Progress: Updated due to goal met PT Goal: Up/Down Stairs - Progress: Met  Visit Information  Last PT Received On: 12/29/11 Assistance Needed: +1    Subjective Data  Subjective: I think I can walk all the way.   Cognition  Overall Cognitive Status: Appears within functional limits for tasks assessed/performed Arousal/Alertness: Awake/alert Orientation Level: Appears intact for tasks assessed Behavior During Session: [facility] for tasks performed    Balance     End of Session PT - End of Session Equipment Utilized During Treatment: Gait belt Activity Tolerance: Patient tolerated treatment well Patient left: in chair;with call bell/phone within reach Nurse Communication: Mobility status   GP     Delorse Lek 12/29/2011, 11:10 AM Delaney Meigs, PT [phone]

## 2011-12-29 NOTE — Progress Notes (Signed)
Subjective: 3 Days Post-Op Procedure(s) (LRB): IRRIGATION AND DEBRIDEMENT EXTREMITY (Left) Patient reports pain as mild.    Objective: Vital signs in last 24 hours: Temp:  [97.6 F (36.4 C)-99 F (37.2 C)] 98.8 F (37.1 C) (08/12 0504) Pulse Rate:  [78-82] 82  (08/12 0504) Resp:  [16-20] 20  (08/12 0504) BP: (130-188)/(70-102) 187/89 mmHg (08/12 0504) SpO2:  [96 %-98 %] 96 % (08/12 0504)  Intake/Output from previous day: 08/11 0701 - 08/12 0700 In: 600 [P.O.:600] Out: -  Intake/Output this shift:      Basename 12/28/11 0515  NA 140  K 3.4*  CL 102  CO2 26  BUN 8  CREATININE 0.83  GLUCOSE 134*  CALCIUM 8.3*     PE:  L foot wound with wound VAC in place.  Hallux appears dusky.  2nd toe well perfused.  Active PF and DF at toes.  Assessment/Plan: 3 Days Post-Op Procedure(s) (LRB): IRRIGATION AND DEBRIDEMENT EXTREMITY (Left) Spoke with pt about treatment options in detail.  I don't believe the hallux can be salvaged.  I recommend 1st ray amp.  This should allow a flap to cover the skin loss at the second toe base.  I'll try to get her on the schedule for tomorrow.    Toni Arthurs 12/29/2011, 7:36 AM

## 2011-12-29 NOTE — Progress Notes (Signed)
[facility] PROGRESS NOTE  PAULINA MUCHMORE NWG:[id number] DOB: [birth year] DOA: 12/23/2011 PCP: [name], MD  Assessment/Plan:  Diabetic foot ulcer Active Problems:  CVA (cerebral infarction)  Irritable bowel syndrome  DM type 2, uncontrolled, with neuropathy  Syncope  TIA (transient ischemic attack)  Cellulitis of left lower extremity  1. Diabetic foot ulcer: Appreciate input from Dr. Victorino Dike. His treatment plan is for amputation of the first ray of her to be scheduled for tomorrow 2. Diabetes type 2: Pt has had improvement of her blood sugars. I've increased her Lantus to 10 unitssubcutaneous  at bedtime and further monitor blood sugars. 3. Recent CVA: Patient will continue on her Plavix on a daily basis. 4. Hyperlipidemia: Patient will continue on her simvastatin 5. Hypertension: Continue Prinivil. 6. Mild hypokalemia: Replaced orally  Code Status: Full code Family Communication: Not applicable Disposition Plan: Not yet determined  Glennda Weatherholtz A.  [facility] Pager [phone]. If 8PM-8AM, please contact night-coverage at [email or web address], password [facility] 12/29/2011, 6:27 PM  LOS: 6 days   Brief narrative: This is a patient with uncontrolled diabetes type 2 who presented to the emergency room with what appeared to be cellulitis of the left lower extremity. It turns out that this was an extensive diabetic fold ulcer. The patient underwent incision and drainage by Dr. Ranell Patrick and currently has an open wound that is wrapped. The patient is on IV antibiotics  Consultants:  Dr. Parke Simmers surgery  Procedures:  Status post incision and drainage 12/23/2011  Antibiotics:  Flagyl 8/6 >  Moxifloxacin  8/6 >  Vancomycin 8/6 >  HPI/Subjective: Patient very melancholy this morning after receiving the news that she will have to have a pursestring amputation done tomorrow. She is however grateful that she will not lose her entire foot  Objective: Filed  Vitals:   12/28/11 1459 12/28/11 2235 12/29/11 0504 12/29/11 1313  BP: 130/77 149/70 187/89 178/86  Pulse: 78 81 82 86  Temp: 97.6 F (36.4 C) 99 F (37.2 C) 98.8 F (37.1 C) 98.2 F (36.8 C)  TempSrc: Oral Oral Oral Oral  Resp: 16 18 20 18   Height:      Weight:      SpO2: 98% 97% 96% 96%   Weight change:   Intake/Output Summary (Last 24 hours) at 12/29/11 1827 Last data filed at 12/29/11 1314  Gross per 24 hour  Intake    582 ml  Output      0 ml  Net    582 ml    General: Alert, awake, oriented x3, in no acute distress.  Heart: S1 and S2 normal  Lungs: Clear to auscultation.  Neuro: No focal neurological deficits noted cranial nerves II through XII grossly intact.  Musculoskeletal: No warm swelling or erythema around joints.She has good distal pulses and capillary refill is less than 3 seconds   Data Reviewed: Basic Metabolic Panel:  Lab 12/28/11 2952 12/25/11 1949 12/23/11 1315 12/23/11 1048  NA 140 137 -- 137  K 3.4* 3.4* -- 3.5  CL 102 99 -- 99  CO2 26 25 -- 25  GLUCOSE 134* 237* -- 193*  BUN 8 10 -- 19  CREATININE 0.83 0.99 0.86 0.90  CALCIUM 8.3* 8.8 -- 9.0  MG -- -- -- --  PHOS -- -- -- --   Liver Function Tests:  Lab 12/25/11 1949  AST 34  ALT 40*  ALKPHOS 181*  BILITOT 0.3  PROT 7.5  ALBUMIN 2.6*   No results found for this basename: LIPASE:5,AMYLASE:5 in the last  168 hours No results found for this basename: AMMONIA:5 in the last 168 hours CBC:  Lab 12/25/11 1949 12/24/11 0538 12/23/11 1607 12/23/11 1315 12/23/11 1048  WBC 12.9* 15.7* 18.3* 19.8* 22.3*  NEUTROABS -- 13.0* -- -- --  HGB 11.3* 9.9* 11.4* 11.3* 11.9*  HCT 34.0* 30.6* 34.0* 33.6* 35.6*  MCV 84.6 85.2 83.7 83.6 84.0  PLT 268 206 227 225 251   Cardiac Enzymes: No results found for this basename: CKTOTAL:5,CKMB:5,CKMBINDEX:5,TROPONINI:5 in the last 168 hours BNP (last 3 results) No results found for this basename: PROBNP:3 in the last 8760 hours CBG:  Lab 12/29/11 1734  12/29/11 1203 12/29/11 0752 12/28/11 2233 12/28/11 1702  GLUCAP 207* 180* 155* 145* 123*    Recent Results (from the past 240 hour(s))  SURGICAL PCR SCREEN     Status: Normal   Collection Time   12/23/11  7:43 PM      Component Value Range Status Comment   MRSA, PCR NEGATIVE  NEGATIVE Final    Staphylococcus aureus NEGATIVE  NEGATIVE Final   AFB CULTURE WITH SMEAR     Status: Normal (Preliminary result)   Collection Time   12/23/11 11:05 PM      Component Value Range Status Comment   Specimen Description WOUND LEFT FOOT   Final    Special Requests NONE   Final    ACID FAST SMEAR NO ACID FAST BACILLI SEEN   Final    Culture     Final    Value: CULTURE WILL BE EXAMINED FOR 6 WEEKS BEFORE ISSUING A FINAL REPORT   Report Status PENDING   Incomplete   ANAEROBIC CULTURE     Status: Normal   Collection Time   12/23/11 11:05 PM      Component Value Range Status Comment   Specimen Description WOUND LEFT FOOT   Final    Special Requests NONE   Final    Gram Stain     Final    Value: FEW WBC PRESENT,BOTH PMN AND MONONUCLEAR     NO SQUAMOUS EPITHELIAL CELLS SEEN     ABUNDANT GRAM POSITIVE COCCI IN PAIRS   Culture NO ANAEROBES ISOLATED   Final    Report Status 12/28/2011 FINAL   Final   WOUND CULTURE     Status: Normal   Collection Time   12/23/11 11:05 PM      Component Value Range Status Comment   Specimen Description WOUND LEFT FOOT   Final    Special Requests NONE   Final    Gram Stain     Final    Value: FEW WBC PRESENT,BOTH PMN AND MONONUCLEAR     NO SQUAMOUS EPITHELIAL CELLS SEEN     ABUNDANT GRAM POSITIVE COCCI IN PAIRS   Culture     Final    Value: MULTIPLE ORGANISMS PRESENT, NONE PREDOMINANT     Note: NO STAPHYLOCOCCUS AUREUS ISOLATED NO GROUP A STREP (S.PYOGENES) ISOLATED   Report Status 12/26/2011 FINAL   Final      Studies: Dg Chest 2 View  12/15/2011  *RADIOLOGY REPORT*  Clinical Data: Dizziness  CHEST - 2 VIEW  Comparison: 11/26/2011  Findings: Normal heart size.  Under  aerated and grossly clear lungs.  No pneumothorax.  No pleural effusions.  Stable thoracic spine.  IMPRESSION: No active cardiopulmonary disease.  Original Report Authenticated By: Donavan Burnet, M.D.   Dg Chest 2 View  11/26/2011  *RADIOLOGY REPORT*  Clinical Data: Shortness of breath and weakness.  CHEST -  2 VIEW  Comparison: PA and lateral chest 05/29/2011.  Findings: Lungs are clear.  Heart size is normal.  No pneumothorax or pleural fluid.  IMPRESSION: Negative chest.  Original Report Authenticated By: Bernadene Bell. Maricela Curet, M.D.   Ct Head Wo Contrast  12/23/2011  *RADIOLOGY REPORT*  Clinical Data: Right frontal headaches  CT HEAD WITHOUT CONTRAST  Technique:  Contiguous axial images were obtained from the base of the skull through the vertex without contrast.  Comparison: July 01, 2011  Findings: The ventricles are normal in size, shape, and position. There is no mass effect or midline shift.  No acute hemorrhage or abnormal extra-axial fluid collections are identified.  The gray/white differentiation is normal.  The orbits, calvarium, visualized paranasal sinuses have a normal appearance.  IMPRESSION: Normal CT scan of the head with no evidence of acute intracranial abnormality.  Original Report Authenticated By: Brandon Melnick, M.D.   Ct Head Wo Contrast  12/15/2011  *RADIOLOGY REPORT*  Clinical Data: Dizziness.  Nausea.  Headache.  Left arm numbness.  CT HEAD WITHOUT CONTRAST  Technique:  Contiguous axial images were obtained from the base of the skull through the vertex without contrast.  Comparison: 07/02/2011  Findings: The brain shows generalized atrophy.  No sign of old or acute focal infarction, mass lesion, hemorrhage, hydrocephalus or extra-axial collection.  The calvarium is unremarkable.  Sinuses, middle ears and mastoids are clear.  IMPRESSION: Negative head CT.  Mild generalized atrophy.  No focal or acute finding.  Original Report Authenticated By: Thomasenia Sales, M.D.   Mri Brain  Without Contrast  12/16/2011  *RADIOLOGY REPORT*  Clinical Data:  Left sided numbness.  Facial droop  MRI HEAD WITHOUT CONTRAST MRA HEAD WITHOUT CONTRAST  Technique:  Multiplanar, multiecho pulse sequences of the brain and surrounding structures were obtained without intravenous contrast. Angiographic images of the head were obtained using MRA technique without contrast.  Comparison:  CT 12/15/2011  MRI HEAD  Findings:  Sub centimeter acute infarct in the right frontal white matter.  No other areas of acute infarct.  Mild chronic microvascular ischemic change in the white matter. Ventricle size is normal.  Negative for hemorrhage or mass. Paranasal sinuses are clear.  IMPRESSION: Acute infarct in the right frontal white matter adjacent to the frontal horn of the lateral ventricle.  MRA HEAD  Findings: Left vertebral artery is dominant.  Small right vertebral artery with a moderate stenosis distally.  Mild irregularity the basilar due to atherosclerotic disease.  PICA is patent bilaterally.  Superior cerebellar and posterior cerebral arteries are patent.  Fetal origin of the posterior cerebral artery bilaterally.  Basilar artery ends in the superior cerebellar arteries.  Internal carotid artery is patent bilaterally with mild atherosclerotic disease in the cavernous segment.  Anterior and middle cerebral arteries are patent bilaterally.  Mild irregularity of the middle cerebral artery branches bilaterally suggesting atherosclerotic disease.  Negative for cerebral aneurysm.  IMPRESSION: Mild to moderate intracranial atherosclerotic disease without large vessel occlusion.  Original Report Authenticated By: Camelia Phenes, M.D.   Dg Foot Complete Left  12/23/2011  *RADIOLOGY REPORT*  Clinical Data: Ulcer left foot.  LEFT FOOT - COMPLETE 3+ VIEW  Comparison: 09/04/2011.  Findings: There is soft tissue gas at the base of the left great toe and extending between the first and second metatarsals compatible with  infection.  No radiographic changes of osteomyelitis at this time.  Old deformity and destruction of the left fifth phalanges, stable.  IMPRESSION: Extensive soft tissue  gas in the base of the left great toe and extending between the first and second metatarsals compatible with soft tissue infection.  No radiographic changes of acute osteomyelitis.  Original Report Authenticated By: Cyndie Chime, M.D.   Mr Mra Head/brain Wo Cm  12/16/2011  *RADIOLOGY REPORT*  Clinical Data:  Left sided numbness.  Facial droop  MRI HEAD WITHOUT CONTRAST MRA HEAD WITHOUT CONTRAST  Technique:  Multiplanar, multiecho pulse sequences of the brain and surrounding structures were obtained without intravenous contrast. Angiographic images of the head were obtained using MRA technique without contrast.  Comparison:  CT 12/15/2011  MRI HEAD  Findings:  Sub centimeter acute infarct in the right frontal white matter.  No other areas of acute infarct.  Mild chronic microvascular ischemic change in the white matter. Ventricle size is normal.  Negative for hemorrhage or mass. Paranasal sinuses are clear.  IMPRESSION: Acute infarct in the right frontal white matter adjacent to the frontal horn of the lateral ventricle.  MRA HEAD  Findings: Left vertebral artery is dominant.  Small right vertebral artery with a moderate stenosis distally.  Mild irregularity the basilar due to atherosclerotic disease.  PICA is patent bilaterally.  Superior cerebellar and posterior cerebral arteries are patent.  Fetal origin of the posterior cerebral artery bilaterally.  Basilar artery ends in the superior cerebellar arteries.  Internal carotid artery is patent bilaterally with mild atherosclerotic disease in the cavernous segment.  Anterior and middle cerebral arteries are patent bilaterally.  Mild irregularity of the middle cerebral artery branches bilaterally suggesting atherosclerotic disease.  Negative for cerebral aneurysm.  IMPRESSION: Mild to moderate  intracranial atherosclerotic disease without large vessel occlusion.  Original Report Authenticated By: Camelia Phenes, M.D.    Scheduled Meds:    . clopidogrel  75 mg Oral Q breakfast  . glimepiride  4 mg Oral QAC breakfast  . insulin aspart  0-20 Units Subcutaneous TID WC  . insulin aspart  0-5 Units Subcutaneous QHS  . insulin aspart  8 Units Subcutaneous TID WC  . insulin glargine  10 Units Subcutaneous QHS  . lisinopril  20 mg Oral Daily  . metronidazole  500 mg Intravenous Q8H  . moxifloxacin  400 mg Intravenous Q24H  . potassium chloride  40 mEq Oral BID  . simvastatin  40 mg Oral QPM  . DISCONTD: insulin glargine  25 Units Subcutaneous QHS   Continuous Infusions:    . sodium chloride 50 mL/hr at 12/28/11 0359    Principal Problem:  *Diabetic ulcer of left foot Active Problems:  HYPERLIPIDEMIA  HYPERTENSION  CVA (cerebral infarction)  Irritable bowel syndrome  DM type 2, uncontrolled, with neuropathy  Syncope  TIA (transient ischemic attack)  Cellulitis of left lower extremity  Hypoglycemia associated with diabetes

## 2011-12-29 NOTE — Progress Notes (Signed)
It should be noted that I saw this pt in PACU on 12/26/11 and the signed note I made then did not appear in EPIC.  I have re done the note and will close the chart.

## 2011-12-30 ENCOUNTER — Encounter (HOSPITAL_COMMUNITY): Payer: Self-pay | Admitting: Anesthesiology

## 2011-12-30 ENCOUNTER — Encounter (HOSPITAL_COMMUNITY)
Admission: EM | Disposition: A | Discharge status: Home Health | Payer: Self-pay | Source: Home / Self Care | Attending: Internal Medicine

## 2011-12-30 ENCOUNTER — Inpatient Hospital Stay (HOSPITAL_COMMUNITY): Payer: Managed Care, Other (non HMO) | Admitting: Anesthesiology

## 2011-12-30 HISTORY — PX: AMPUTATION: SHX166

## 2011-12-30 LAB — GLUCOSE, CAPILLARY
Glucose-Capillary: 158 mg/dL — ABNORMAL HIGH (ref 70–99)
Glucose-Capillary: 127 mg/dL — ABNORMAL HIGH (ref 70–99)

## 2011-12-30 SURGERY — AMPUTATION, FOOT, RAY
Anesthesia: General | Site: Foot | Laterality: Left | Wound class: Dirty or Infected

## 2011-12-30 MED ORDER — BACITRACIN ZINC 500 UNIT/GM EX OINT
TOPICAL_OINTMENT | CUTANEOUS | Status: DC | PRN
  Administered 2011-12-30: 1 via TOPICAL

## 2011-12-30 MED ORDER — PROPOFOL 10 MG/ML IV EMUL
INTRAVENOUS | Status: DC | PRN
  Administered 2011-12-30: 200 mg via INTRAVENOUS

## 2011-12-30 MED ORDER — ENOXAPARIN SODIUM 40 MG/0.4ML ~~LOC~~ SOLN
40.0000 mg | SUBCUTANEOUS | Status: DC
  Administered 2011-12-31: 40 mg via SUBCUTANEOUS
  Filled 2011-12-30 (×2): qty 0.4

## 2011-12-30 MED ORDER — SODIUM CHLORIDE 0.9 % IV SOLN
INTRAVENOUS | Status: DC
  Administered 2011-12-30 – 2011-12-31 (×2): via INTRAVENOUS

## 2011-12-30 MED ORDER — ONDANSETRON HCL 4 MG/2ML IJ SOLN: 4.0000 mg | Freq: Once | INTRAMUSCULAR | Status: DC | PRN

## 2011-12-30 MED ORDER — DOUBLE ANTIBIOTIC 500-10000 UNIT/GM EX OINT
TOPICAL_OINTMENT | CUTANEOUS | Status: AC
Start: 2011-12-30 — End: 2011-12-30
  Filled 2011-12-30: qty 1

## 2011-12-30 MED ORDER — FENTANYL CITRATE 0.05 MG/ML IJ SOLN
INTRAMUSCULAR | Status: DC | PRN
  Administered 2011-12-30: 50 ug via INTRAVENOUS
  Administered 2011-12-30: 100 ug via INTRAVENOUS

## 2011-12-30 MED ORDER — HYDROMORPHONE HCL PF 1 MG/ML IJ SOLN
INTRAMUSCULAR | Status: AC
  Filled 2011-12-30: qty 1

## 2011-12-30 MED ORDER — LACTATED RINGERS IV SOLN
INTRAVENOUS | Status: DC | PRN
  Administered 2011-12-30: 13:00:00 via INTRAVENOUS

## 2011-12-30 MED ORDER — LACTATED RINGERS IV SOLN
INTRAVENOUS | Status: DC
  Administered 2011-12-30: 12:00:00 via INTRAVENOUS

## 2011-12-30 MED ORDER — ONDANSETRON HCL 4 MG/2ML IJ SOLN
INTRAMUSCULAR | Status: DC | PRN
  Administered 2011-12-30: 4 mg via INTRAVENOUS

## 2011-12-30 MED ORDER — MIDAZOLAM HCL 5 MG/5ML IJ SOLN
INTRAMUSCULAR | Status: DC | PRN
  Administered 2011-12-30: 2 mg via INTRAVENOUS

## 2011-12-30 MED ORDER — LIDOCAINE HCL (CARDIAC) 20 MG/ML IV SOLN
INTRAVENOUS | Status: DC | PRN
  Administered 2011-12-30: 100 mg via INTRAVENOUS

## 2011-12-30 MED ORDER — HYDROMORPHONE HCL PF 1 MG/ML IJ SOLN
0.2500 mg | INTRAMUSCULAR | Status: DC | PRN
  Administered 2011-12-30 (×2): 0.5 mg via INTRAVENOUS

## 2011-12-30 SURGICAL SUPPLY — 36 items
BLADE LONG MED 31X9 (MISCELLANEOUS) ×2 IMPLANT
BNDG COHESIVE 4X5 TAN STRL (GAUZE/BANDAGES/DRESSINGS) ×2 IMPLANT
BNDG COHESIVE 6X5 TAN STRL LF (GAUZE/BANDAGES/DRESSINGS) ×2 IMPLANT
BNDG ESMARK 4X9 LF (GAUZE/BANDAGES/DRESSINGS) ×2 IMPLANT
CHLORAPREP W/TINT 26ML (MISCELLANEOUS) ×2 IMPLANT
CLOTH BEACON ORANGE TIMEOUT ST (SAFETY) ×2 IMPLANT
CUFF TOURNIQUET SINGLE 34IN LL (TOURNIQUET CUFF) ×2 IMPLANT
CUFF TOURNIQUET SINGLE 44IN (TOURNIQUET CUFF) IMPLANT
DRAPE U-SHAPE 47X51 STRL (DRAPES) ×4 IMPLANT
DRSG ADAPTIC 3X8 NADH LF (GAUZE/BANDAGES/DRESSINGS) ×2 IMPLANT
ELECT REM PT RETURN 9FT ADLT (ELECTROSURGICAL) ×2
ELECTRODE REM PT RTRN 9FT ADLT (ELECTROSURGICAL) ×1 IMPLANT
GLOVE BIO SURGEON STRL SZ8 (GLOVE) ×4 IMPLANT
GLOVE BIOGEL PI IND STRL 8 (GLOVE) ×1 IMPLANT
GLOVE BIOGEL PI INDICATOR 8 (GLOVE) ×1
GOWN PREVENTION PLUS XLARGE (GOWN DISPOSABLE) ×2 IMPLANT
GOWN STRL NON-REIN LRG LVL3 (GOWN DISPOSABLE) ×2 IMPLANT
KIT BASIN OR (CUSTOM PROCEDURE TRAY) ×2 IMPLANT
KIT ROOM TURNOVER OR (KITS) ×2 IMPLANT
MANIFOLD NEPTUNE II (INSTRUMENTS) ×2 IMPLANT
NS IRRIG 1000ML POUR BTL (IV SOLUTION) ×2 IMPLANT
PACK ORTHO EXTREMITY (CUSTOM PROCEDURE TRAY) ×2 IMPLANT
PAD ARMBOARD 7.5X6 YLW CONV (MISCELLANEOUS) ×4 IMPLANT
PAD CAST 4YDX4 CTTN HI CHSV (CAST SUPPLIES) ×1 IMPLANT
PADDING CAST COTTON 4X4 STRL (CAST SUPPLIES) ×1
SPONGE GAUZE 4X4 12PLY (GAUZE/BANDAGES/DRESSINGS) ×2 IMPLANT
SPONGE LAP 18X18 X RAY DECT (DISPOSABLE) ×2 IMPLANT
STAPLER VISISTAT 35W (STAPLE) IMPLANT
STOCKINETTE IMPERVIOUS LG (DRAPES) IMPLANT
SUCTION FRAZIER TIP 10 FR DISP (SUCTIONS) ×2 IMPLANT
SUT ETHILON 2 0 PSLX (SUTURE) IMPLANT
TOWEL OR 17X24 6PK STRL BLUE (TOWEL DISPOSABLE) ×2 IMPLANT
TOWEL OR 17X26 10 PK STRL BLUE (TOWEL DISPOSABLE) ×2 IMPLANT
TUBE CONNECTING 12X1/4 (SUCTIONS) ×2 IMPLANT
UNDERPAD 30X30 INCONTINENT (UNDERPADS AND DIAPERS) ×2 IMPLANT
WATER STERILE IRR 1000ML POUR (IV SOLUTION) ×2 IMPLANT

## 2011-12-30 NOTE — Progress Notes (Signed)
Report received from Shelly RN

## 2011-12-30 NOTE — Transfer of Care (Signed)
Immediate Anesthesia Transfer of Care Note  Patient: [name]  Procedure(s) Performed: Procedure(s) (LRB): AMPUTATION RAY (Left)  Patient Location: PACU  Anesthesia Type: General  Level of Consciousness: awake and sedated  Airway & Oxygen Therapy: Patient Spontanous Breathing and Patient connected to nasal cannula oxygen  Post-op Assessment: Report given to PACU RN and Post -op Vital signs reviewed and stable  Post vital signs: Reviewed and stable  Complications: No apparent anesthesia complications

## 2011-12-30 NOTE — Anesthesia Postprocedure Evaluation (Signed)
  Anesthesia Post-op Note  Patient: [name]  Procedure(s) Performed: Procedure(s) (LRB): AMPUTATION RAY (Left)  Patient Location: PACU  Anesthesia Type: General  Level of Consciousness: awake, alert  and oriented  Airway and Oxygen Therapy: Patient Spontanous Breathing  Post-op Pain: none  Post-op Assessment: Post-op Vital signs reviewed, Patient's Cardiovascular Status Stable, Respiratory Function Stable, Patent Airway and No signs of Nausea or vomiting  Post-op Vital Signs: Reviewed and stable  Complications: No apparent anesthesia complications

## 2011-12-30 NOTE — Anesthesia Procedure Notes (Signed)
Procedure Name: LMA Insertion Date/Time: 12/30/2011 1:15 PM Performed by: Zaire Levesque S Pre-anesthesia Checklist: Patient identified, Emergency Drugs available, Suction available, Patient being monitored and Timeout performed Patient Re-evaluated:Patient Re-evaluated prior to inductionOxygen Delivery Method: Circle system utilized Preoxygenation: Pre-oxygenation with 100% oxygen Intubation Type: IV induction Ventilation: Mask ventilation without difficulty LMA: LMA inserted LMA Size: 4.0 Number of attempts: 1 Tube secured with: Tape Dental Injury: Teeth and Oropharynx as per pre-operative assessment

## 2011-12-30 NOTE — Brief Op Note (Signed)
12/23/2011 - 12/30/2011  1:53 PM  PATIENT:  [name]  56 y.o. female  PRE-OPERATIVE DIAGNOSIS:  LEFT FOOT DIABETIC WOUND  POST-OPERATIVE DIAGNOSIS:  LEFT FOOT DIABETIC WOUND  Procedure(s): Left foot first ray amputation  SURGEON:  Toni Arthurs, MD  ASSISTANT: n/a  ANESTHESIA:   General  EBL:  minimal   TOURNIQUET:  n/a  COMPLICATIONS:  None apparent  DISPOSITION:  Extubated, awake and stable to recovery.  DICTATION ID:  [id number]

## 2011-12-30 NOTE — Progress Notes (Signed)
[facility] PROGRESS NOTE  [name] AVW:[id number] DOB: Aug 28, 1955 DOA: 12/23/2011 PCP: [name], MD  Assessment/Plan:  Diabetic foot ulcer Active Problems:  CVA (cerebral infarction)  Irritable bowel syndrome  DM type 2, uncontrolled, with neuropathy  Syncope  TIA (transient ischemic attack)  Cellulitis of left lower extremity  1. Diabetic foot ulcer: Appreciate input from Dr. Victorino Dike. Patient taken back to surgery today for amputation of first ray. 2. Diabetes type 2: Blood sugars well-controlled on current regimen of Lantus 10 units subcutaneous at bedtime, sliding scale and meal coverage. However once the patient is postsurgical and eating her meals Lantus may need increasing. 3. Recent CVA: Patient will continue on her Plavix on a daily basis. 4. Hyperlipidemia: Patient will continue on her simvastatin 5. Hypertension: Continue Prinivil. 6. Mild hypokalemia: Replaced orally  Code Status: Full code Family Communication: Not applicable Disposition Plan: Not yet determined  [name],[name]  [facility] Pager [phone]. If 8PM-8AM, please contact night-coverage at [email or web address], password [facility] 12/30/2011, 4:36 PM  LOS: 7 days   Brief narrative: This is a patient with uncontrolled diabetes type 2 who presented to the emergency room with what appeared to be cellulitis of the left lower extremity. It turns out that this was an extensive diabetic fold ulcer. The patient underwent incision and drainage by Dr. Ranell Patrick and currently has an open wound that is wrapped. The patient is on IV antibiotics  Consultants:  Dr. Parke Simmers surgery  Procedures:  Status post incision and drainage 12/23/2011  Antibiotics:  Flagyl 8/6 > 8/12  Moxifloxacin  8/6 >  Vancomycin 8/6 > 8/9  HPI/Subjective: Patient in much better spirits today.Scheduled for amputation today. Objective: Filed Vitals:   12/30/11 1445 12/30/11 1500 12/30/11 1545 12/30/11 1615  BP:  156/94   170/92  Pulse: 76 77  86  Temp:   97 F (36.1 C) 97.6 F (36.4 C)  TempSrc:      Resp: 17   20  Height:      Weight:      SpO2: 97%   96%   Weight change:   Intake/Output Summary (Last 24 hours) at 12/30/11 1636 Last data filed at 12/30/11 1345  Gross per 24 hour  Intake 1718.33 ml  Output      0 ml  Net 1718.33 ml    General: Alert, awake, oriented x3, in no acute distress.  Heart: S1 and S2 normal , no murmurs rubs or gallops noted Lungs: Clear to auscultation. No wheezing or rhonchi noted Neuro: No focal neurological deficits noted cranial nerves II through XII grossly intact.  Musculoskeletal: No warm swelling or erythema around joints.She has good distal pulses and capillary refill is less than 3 seconds    Data Reviewed: Basic Metabolic Panel:  Lab 12/28/11 3086 12/25/11 1949  NA 140 137  K 3.4* 3.4*  CL 102 99  CO2 26 25  GLUCOSE 134* 237*  BUN 8 10  CREATININE 0.83 0.99  CALCIUM 8.3* 8.8  MG -- --  PHOS -- --   Liver Function Tests:  Lab 12/25/11 1949  AST 34  ALT 40*  ALKPHOS 181*  BILITOT 0.3  PROT 7.5  ALBUMIN 2.6*   No results found for this basename: LIPASE:5,AMYLASE:5 in the last 168 hours No results found for this basename: AMMONIA:5 in the last 168 hours CBC:  Lab 12/25/11 1949 12/24/11 0538  WBC 12.9* 15.7*  NEUTROABS -- 13.0*  HGB 11.3* 9.9*  HCT 34.0* 30.6*  MCV 84.6 85.2  PLT 268  206   Cardiac Enzymes: No results found for this basename: CKTOTAL:5,CKMB:5,CKMBINDEX:5,TROPONINI:5 in the last 168 hours BNP (last 3 results) No results found for this basename: PROBNP:3 in the last 8760 hours CBG:  Lab 12/30/11 1406 12/30/11 1056 12/30/11 0737 12/29/11 2159 12/29/11 1734  GLUCAP 128* 127* 158* 132* 207*    Recent Results (from the past 240 hour(s))  SURGICAL PCR SCREEN     Status: Normal   Collection Time   12/23/11  7:43 PM      Component Value Range Status Comment   MRSA, PCR NEGATIVE  NEGATIVE Final     Staphylococcus aureus NEGATIVE  NEGATIVE Final   AFB CULTURE WITH SMEAR     Status: Normal (Preliminary result)   Collection Time   12/23/11 11:05 PM      Component Value Range Status Comment   Specimen Description WOUND LEFT FOOT   Final    Special Requests NONE   Final    ACID FAST SMEAR NO ACID FAST BACILLI SEEN   Final    Culture     Final    Value: CULTURE WILL BE EXAMINED FOR 6 WEEKS BEFORE ISSUING A FINAL REPORT   Report Status PENDING   Incomplete   ANAEROBIC CULTURE     Status: Normal   Collection Time   12/23/11 11:05 PM      Component Value Range Status Comment   Specimen Description WOUND LEFT FOOT   Final    Special Requests NONE   Final    Gram Stain     Final    Value: FEW WBC PRESENT,BOTH PMN AND MONONUCLEAR     NO SQUAMOUS EPITHELIAL CELLS SEEN     ABUNDANT GRAM POSITIVE COCCI IN PAIRS   Culture NO ANAEROBES ISOLATED   Final    Report Status 12/28/2011 FINAL   Final   WOUND CULTURE     Status: Normal   Collection Time   12/23/11 11:05 PM      Component Value Range Status Comment   Specimen Description WOUND LEFT FOOT   Final    Special Requests NONE   Final    Gram Stain     Final    Value: FEW WBC PRESENT,BOTH PMN AND MONONUCLEAR     NO SQUAMOUS EPITHELIAL CELLS SEEN     ABUNDANT GRAM POSITIVE COCCI IN PAIRS   Culture     Final    Value: MULTIPLE ORGANISMS PRESENT, NONE PREDOMINANT     Note: NO STAPHYLOCOCCUS AUREUS ISOLATED NO GROUP A STREP (S.PYOGENES) ISOLATED   Report Status 12/26/2011 FINAL   Final      Studies: Dg Chest 2 View  12/15/2011  *RADIOLOGY REPORT*  Clinical Data: Dizziness  CHEST - 2 VIEW  Comparison: 11/26/2011  Findings: Normal heart size.  Under aerated and grossly clear lungs.  No pneumothorax.  No pleural effusions.  Stable thoracic spine.  IMPRESSION: No active cardiopulmonary disease.  Original Report Authenticated By: Donavan Burnet, M.D.   Dg Chest 2 View  11/26/2011  *RADIOLOGY REPORT*  Clinical Data: Shortness of breath and weakness.   CHEST - 2 VIEW  Comparison: PA and lateral chest 05/29/2011.  Findings: Lungs are clear.  Heart size is normal.  No pneumothorax or pleural fluid.  IMPRESSION: Negative chest.  Original Report Authenticated By: Bernadene Bell. Maricela Curet, M.D.   Ct Head Wo Contrast  12/23/2011  *RADIOLOGY REPORT*  Clinical Data: Right frontal headaches  CT HEAD WITHOUT CONTRAST  Technique:  Contiguous axial images were obtained  from the base of the skull through the vertex without contrast.  Comparison: July 01, 2011  Findings: The ventricles are normal in size, shape, and position. There is no mass effect or midline shift.  No acute hemorrhage or abnormal extra-axial fluid collections are identified.  The gray/white differentiation is normal.  The orbits, calvarium, visualized paranasal sinuses have a normal appearance.  IMPRESSION: Normal CT scan of the head with no evidence of acute intracranial abnormality.  Original Report Authenticated By: Brandon Melnick, M.D.   Ct Head Wo Contrast  12/15/2011  *RADIOLOGY REPORT*  Clinical Data: Dizziness.  Nausea.  Headache.  Left arm numbness.  CT HEAD WITHOUT CONTRAST  Technique:  Contiguous axial images were obtained from the base of the skull through the vertex without contrast.  Comparison: 07/02/2011  Findings: The brain shows generalized atrophy.  No sign of old or acute focal infarction, mass lesion, hemorrhage, hydrocephalus or extra-axial collection.  The calvarium is unremarkable.  Sinuses, middle ears and mastoids are clear.  IMPRESSION: Negative head CT.  Mild generalized atrophy.  No focal or acute finding.  Original Report Authenticated By: Thomasenia Sales, M.D.   Mri Brain Without Contrast  12/16/2011  *RADIOLOGY REPORT*  Clinical Data:  Left sided numbness.  Facial droop  MRI HEAD WITHOUT CONTRAST MRA HEAD WITHOUT CONTRAST  Technique:  Multiplanar, multiecho pulse sequences of the brain and surrounding structures were obtained without intravenous contrast. Angiographic  images of the head were obtained using MRA technique without contrast.  Comparison:  CT 12/15/2011  MRI HEAD  Findings:  Sub centimeter acute infarct in the right frontal white matter.  No other areas of acute infarct.  Mild chronic microvascular ischemic change in the white matter. Ventricle size is normal.  Negative for hemorrhage or mass. Paranasal sinuses are clear.  IMPRESSION: Acute infarct in the right frontal white matter adjacent to the frontal horn of the lateral ventricle.  MRA HEAD  Findings: Left vertebral artery is dominant.  Small right vertebral artery with a moderate stenosis distally.  Mild irregularity the basilar due to atherosclerotic disease.  PICA is patent bilaterally.  Superior cerebellar and posterior cerebral arteries are patent.  Fetal origin of the posterior cerebral artery bilaterally.  Basilar artery ends in the superior cerebellar arteries.  Internal carotid artery is patent bilaterally with mild atherosclerotic disease in the cavernous segment.  Anterior and middle cerebral arteries are patent bilaterally.  Mild irregularity of the middle cerebral artery branches bilaterally suggesting atherosclerotic disease.  Negative for cerebral aneurysm.  IMPRESSION: Mild to moderate intracranial atherosclerotic disease without large vessel occlusion.  Original Report Authenticated By: Camelia Phenes, M.D.   Dg Foot Complete Left  12/23/2011  *RADIOLOGY REPORT*  Clinical Data: Ulcer left foot.  LEFT FOOT - COMPLETE 3+ VIEW  Comparison: 09/04/2011.  Findings: There is soft tissue gas at the base of the left great toe and extending between the first and second metatarsals compatible with infection.  No radiographic changes of osteomyelitis at this time.  Old deformity and destruction of the left fifth phalanges, stable.  IMPRESSION: Extensive soft tissue gas in the base of the left great toe and extending between the first and second metatarsals compatible with soft tissue infection.  No  radiographic changes of acute osteomyelitis.  Original Report Authenticated By: Cyndie Chime, M.D.   Mr Mra Head/brain Wo Cm  12/16/2011  *RADIOLOGY REPORT*  Clinical Data:  Left sided numbness.  Facial droop  MRI HEAD WITHOUT CONTRAST MRA HEAD WITHOUT  CONTRAST  Technique:  Multiplanar, multiecho pulse sequences of the brain and surrounding structures were obtained without intravenous contrast. Angiographic images of the head were obtained using MRA technique without contrast.  Comparison:  CT 12/15/2011  MRI HEAD  Findings:  Sub centimeter acute infarct in the right frontal white matter.  No other areas of acute infarct.  Mild chronic microvascular ischemic change in the white matter. Ventricle size is normal.  Negative for hemorrhage or mass. Paranasal sinuses are clear.  IMPRESSION: Acute infarct in the right frontal white matter adjacent to the frontal horn of the lateral ventricle.  MRA HEAD  Findings: Left vertebral artery is dominant.  Small right vertebral artery with a moderate stenosis distally.  Mild irregularity the basilar due to atherosclerotic disease.  PICA is patent bilaterally.  Superior cerebellar and posterior cerebral arteries are patent.  Fetal origin of the posterior cerebral artery bilaterally.  Basilar artery ends in the superior cerebellar arteries.  Internal carotid artery is patent bilaterally with mild atherosclerotic disease in the cavernous segment.  Anterior and middle cerebral arteries are patent bilaterally.  Mild irregularity of the middle cerebral artery branches bilaterally suggesting atherosclerotic disease.  Negative for cerebral aneurysm.  IMPRESSION: Mild to moderate intracranial atherosclerotic disease without large vessel occlusion.  Original Report Authenticated By: Camelia Phenes, M.D.    Scheduled Meds:    . chlorhexidine  60 mL Topical Once  . clopidogrel  75 mg Oral Q breakfast  . enoxaparin (LOVENOX) injection  40 mg Subcutaneous Q24H  . glimepiride  4  mg Oral QAC breakfast  . HYDROmorphone      . insulin aspart  0-20 Units Subcutaneous TID WC  . insulin aspart  0-5 Units Subcutaneous QHS  . insulin aspart  8 Units Subcutaneous TID WC  . insulin glargine  10 Units Subcutaneous QHS  . lisinopril  20 mg Oral Daily  . metronidazole  500 mg Intravenous Q8H  . moxifloxacin  400 mg Intravenous Q24H  . potassium chloride  40 mEq Oral BID  . simvastatin  40 mg Oral QPM   Continuous Infusions:    . sodium chloride 50 mL/hr at 12/28/11 0359  . sodium chloride 100 mL/hr at 12/30/11 1633  . lactated ringers 50 mL/hr at 12/30/11 1208  . DISCONTD: sodium chloride 100 mL/hr at 12/30/11 0001    Principal Problem:  *Diabetic ulcer of left foot Active Problems:  HYPERLIPIDEMIA  HYPERTENSION  CVA (cerebral infarction)  Irritable bowel syndrome  DM type 2, uncontrolled, with neuropathy  Syncope  TIA (transient ischemic attack)  Cellulitis of left lower extremity  Hypoglycemia associated with diabetes

## 2011-12-30 NOTE — Interval H&P Note (Signed)
History and Physical Interval Note:  12/30/2011 12:51 PM  [name]  has presented today for surgery, with the diagnosis of LEFT FOOT DIABETIC WOUND  The various methods of treatment have been discussed with the patient and family. After consideration of risks, benefits and other options for treatment, the patient has consented to  Procedure(s) (LRB): AMPUTATION RAY (Left 1st) as a surgical intervention .  The patient's history has been reviewed, patient examined, no change in status, stable for surgery.  I have reviewed the patient's chart and labs.  Questions were answered to the patient's satisfaction.    The risks and benefits of the alternative treatment options have been discussed in detail.  The patient wishes to proceed with surgery and specifically understands risks of bleeding, infection, nerve damage, blood clots, need for additional surgery, amputation and death.  Toni Arthurs

## 2011-12-30 NOTE — Preoperative (Signed)
Beta Blockers   Reason not to administer Beta Blockers:Not Applicable 

## 2011-12-30 NOTE — Anesthesia Preprocedure Evaluation (Addendum)
Anesthesia Evaluation  Patient identified by MRN, date of birth, ID band Patient awake    Reviewed: Allergy & Precautions, H&P , NPO status , Patient's Chart, lab work & pertinent test results  Airway Mallampati: I TM Distance: >3 FB Neck ROM: Full    Dental  (+) Teeth Intact and Dental Advisory Given   Pulmonary  breath sounds clear to auscultation        Cardiovascular hypertension, Pt. on medications - angina+ CAD Rhythm:Regular Rate:Normal     Neuro/Psych TIACVA, No Residual Symptoms negative psych ROS   GI/Hepatic Neg liver ROS, GERD-  ,  Endo/Other  Well Controlled, Type 2, Insulin DependentMorbid obesity  Renal/GU negative Renal ROS     Musculoskeletal negative musculoskeletal ROS (+)   Abdominal   Peds  Hematology negative hematology ROS (+)   Anesthesia Other Findings   Reproductive/Obstetrics                         Anesthesia Physical Anesthesia Plan  ASA: III  Anesthesia Plan: General   Post-op Pain Management:    Induction: Intravenous  Airway Management Planned: LMA  Additional Equipment:   Intra-op Plan:   Post-operative Plan: Extubation in OR  Informed Consent: I have reviewed the patients History and Physical, chart, labs and discussed the procedure including the risks, benefits and alternatives for the proposed anesthesia with the patient or authorized representative who has indicated his/her understanding and acceptance.   Dental advisory given  Plan Discussed with: CRNA, Anesthesiologist and Surgeon  Anesthesia Plan Comments:         Anesthesia Quick Evaluation

## 2011-12-30 NOTE — Progress Notes (Signed)
Physical Therapy Treatment Patient Details Name: [name] MRN: [id number] DOB: [birth year] Today's Date: 12/30/2011 Time: 6962-9528 PT Time Calculation (min): 16 min  PT Assessment / Plan / Recommendation Comments on Treatment Session  Pt with multiple I&D of left foot with toe amp planned for later today. Pt continues progressing with mobility and able to don/doff Darco without assistance today. Pt encouraged to ambulate to bathroom and not to use BSC if able. Pt eager for discharge home end of week. Will continue to follow.     Follow Up Recommendations  Home health PT    Barriers to Discharge        Equipment Recommendations       Recommendations for Other Services    Frequency Min 3X/week   Plan Frequency needs to be updated;Discharge plan needs to be updated    Precautions / Restrictions Precautions Precautions: None Restrictions LLE Weight Bearing: Weight bearing as tolerated LLE Partial Weight Bearing Percentage or Pounds: WBAT on heel only Other Position/Activity Restrictions: Darco shoe LLE   Pertinent Vitals/Pain No pain    Mobility  Bed Mobility Bed Mobility: Supine to Sit;Sit to Supine Supine to Sit: 6: Modified independent (Device/Increase time);HOB flat Sitting - Scoot to Edge of Bed: 6: Modified independent (Device/Increase time) Sit to Supine: 6: Modified independent (Device/Increase time);HOB flat Transfers Transfers: Sit to Stand;Stand to Sit Sit to Stand: 6: Modified independent (Device/Increase time);From bed;From toilet Stand to Sit: 6: Modified independent (Device/Increase time);To toilet;To bed Ambulation/Gait Ambulation/Gait Assistance: 5: Supervision Ambulation Distance (Feet): 130 Feet Assistive device: Rolling walker Ambulation/Gait Assistance Details: cueing for positioning in RW and posture, maintained heel weight bearing throughout Gait Pattern: Step-to pattern;Trunk flexed Gait velocity: decreased Stairs: No    Exercises     PT  Diagnosis:    PT Problem List:   PT Treatment Interventions:     PT Goals Acute Rehab PT Goals PT Goal: Supine/Side to Sit - Progress: Met PT Goal: Sit to Supine/Side - Progress: Met PT Goal: Stand to Sit - Progress: Met PT Goal: Ambulate - Progress: Progressing toward goal  Visit Information  Last PT Received On: 12/30/11 Assistance Needed: +1    Subjective Data  Subjective: I guess I can go this morning. Surgery is at 1215   Cognition  Overall Cognitive Status: Appears within functional limits for tasks assessed/performed Arousal/Alertness: Awake/alert Orientation Level: Appears intact for tasks assessed Behavior During Session: [name] for tasks performed    Balance     End of Session PT - End of Session Equipment Utilized During Treatment: Other (comment) (Darco) Activity Tolerance: Patient tolerated treatment well Patient left: in bed;with call bell/phone within reach Nurse Communication: Mobility status   GP     Delorse Lek 12/30/2011, 8:32 AM Delaney Meigs, PT [phone]

## 2011-12-31 ENCOUNTER — Encounter (HOSPITAL_COMMUNITY): Payer: Self-pay | Admitting: *Deleted

## 2011-12-31 DIAGNOSIS — I6350 Cerebral infarction due to unspecified occlusion or stenosis of unspecified cerebral artery: Secondary | ICD-10-CM

## 2011-12-31 DIAGNOSIS — K219 Gastro-esophageal reflux disease without esophagitis: Secondary | ICD-10-CM

## 2011-12-31 DIAGNOSIS — I2510 Atherosclerotic heart disease of native coronary artery without angina pectoris: Secondary | ICD-10-CM

## 2011-12-31 LAB — GLUCOSE, CAPILLARY
Glucose-Capillary: 115 mg/dL — ABNORMAL HIGH (ref 70–99)
Glucose-Capillary: 149 mg/dL — ABNORMAL HIGH (ref 70–99)
Glucose-Capillary: 191 mg/dL — ABNORMAL HIGH (ref 70–99)

## 2011-12-31 MED ORDER — HYDROCODONE-ACETAMINOPHEN 5-325 MG PO TABS: 1.0000 | ORAL_TABLET | ORAL | Status: DC | PRN

## 2011-12-31 MED ORDER — CLINDAMYCIN HCL 300 MG PO CAPS: 600.0000 mg | ORAL_CAPSULE | Freq: Three times a day (TID) | ORAL | Status: DC

## 2011-12-31 MED ORDER — DOCUSATE SODIUM 100 MG PO CAPS: 100.0000 mg | ORAL_CAPSULE | Freq: Two times a day (BID) | ORAL | Status: AC

## 2011-12-31 NOTE — Progress Notes (Signed)
Subjective: 1 Day Post-Op Procedure(s) (LRB): AMPUTATION RAY (Left) Patient reports pain as mild.    Objective: Vital signs in last 24 hours: Temp:  [97 F (36.1 C)-98.7 F (37.1 C)] 97.6 F (36.4 C) (08/14 0558) Pulse Rate:  [75-93] 75  (08/14 0558) Resp:  [13-20] 16  (08/14 0558) BP: (103-170)/(53-110) 103/53 mmHg (08/14 0558) SpO2:  [96 %-100 %] 98 % (08/14 0558)  Intake/Output from previous day: 08/13 0701 - 08/14 0700 In: 2003.3 [P.O.:240; I.V.:1763.3] Out: -  Intake/Output this shift: Total I/O In: 240 [P.O.:240] Out: -   No results found for this basename: HGB:5 in the last 72 hours No results found for this basename: WBC:2,RBC:2,HCT:2,PLT:2 in the last 72 hours No results found for this basename: NA:2,K:2,CL:2,CO2:2,BUN:2,CREATININE:2,GLUCOSE:2,CALCIUM:2 in the last 72 hours No results found for this basename: LABPT:2,INR:2 in the last 72 hours  left foot wound dressed and dry.  toes well perfused.  Assessment/Plan: 1 Day Post-Op Procedure(s) (LRB): AMPUTATION RAY (Left) Discharge home with home health  OK for d/c home from my perspective.  No need for additional antibiotics.  She'll need to wear the hard sole shoe for wb.  Leave dressing in place.  F/u with me in 1-2 weeks.  Toni Arthurs 12/31/2011, 1:02 PM

## 2011-12-31 NOTE — Progress Notes (Signed)
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  Patient did very well with mobility/gait today.  Will need HHPT and RW for discharge.    Follow Up Recommendations  Home health PT    Barriers to Discharge        Equipment Recommendations  Rolling walker with 5" wheels;3 in 1 bedside comode    Recommendations for Other Services    Frequency Min 3X/week   Plan Discharge plan remains appropriate;Frequency remains appropriate;Equipment recommendations need to be updated    Precautions / Restrictions Precautions Precautions: None Restrictions Weight Bearing Restrictions: Yes LLE Weight Bearing: Weight bearing as tolerated LLE Partial Weight Bearing Percentage or Pounds: WBAT on heel only Other Position/Activity Restrictions: Darco shoe LLE       Mobility  Transfers Transfers: Sit to Stand;Stand to Sit Sit to Stand: 6: Modified independent (Device/Increase time);With upper extremity assist;With armrests;From chair/3-in-1 Stand to Sit: 6: Modified independent (Device/Increase time);With upper extremity assist;With armrests;To chair/3-in-1 Details for Transfer Assistance: Patient uses safe technique with proper hand placement.  Cues to scoot to edge of chair before standing. Ambulation/Gait Ambulation/Gait Assistance: 5: Supervision Ambulation Distance (Feet): 140 Feet Assistive device: Rolling walker Ambulation/Gait Assistance Details: Good technique with safe use of RW.   Gait Pattern: Step-to pattern;Trunk flexed Gait velocity: decreased      PT Goals Acute Rehab PT Goals PT Goal: Sit to Stand - Progress: Met PT Goal: Stand to Sit - Progress: Met PT Goal: Ambulate - Progress: Partly met  Visit Information  Last PT Received On:  12/31/11 Assistance Needed: +1    Subjective Data  Subjective: I am so ready to go home   Cognition  Overall Cognitive Status: Appears within functional limits for tasks assessed/performed Arousal/Alertness: Awake/alert Orientation Level: Appears intact for tasks assessed Behavior During Session: [facility] for tasks performed    Balance     End of Session PT - End of Session Equipment Utilized During Treatment: Other (comment) (Darco shoe) Activity Tolerance: Patient tolerated treatment well Patient left: in chair;with call bell/phone within reach Nurse Communication: Mobility status   GP     Vena Austria 12/31/2011, 10:21 AM Durenda Hurt. Renaldo Fiddler, [facility]hab [facility] Pager [phone]

## 2011-12-31 NOTE — Care Management Note (Signed)
    Page 1 of 2   12/31/2011     11:49:01 AM   CARE MANAGEMENT NOTE 12/31/2011  Patient:  [name], [name]   Account Number:  [id number]  Date Initiated:  12/31/2011  Documentation initiated by:  Letha Cape  Subjective/Objective Assessment:   dx diabetic ulcer of left foot  admit- lives alone.     Action/Plan:   s/p first ray amputation   Anticipated DC Date:  12/31/2011   Anticipated DC Plan:  HOME W HOME HEALTH SERVICES      DC Planning Services  CM consult      [facility] Choice  HOME HEALTH   Choice offered to / List presented to:  C-1 Patient   DME arranged  3-N-1  WALKER - ROLLING      DME agency  [facility]     HH arranged  HH-2 PT      HH agency  [facility]   Status of service:  Completed, signed off Medicare Important Message given?   (If response is "NO", the following Medicare IM given date fields will be blank) Date Medicare IM given:   Date Additional Medicare IM given:    Discharge Disposition:  HOME W HOME HEALTH SERVICES  Per UR Regulation:  Reviewed for med. necessity/level of care/duration of stay  If discussed at Long Length of Stay Meetings, dates discussed:    Comments:  12/31/11 10:00 Letha Cape RN, BSN [phone] patient lives alone, has medication coverage and transportation.  Per physical therapy recs rolling walker and a bedside commode.  Referral made to [employer] and faxed orders , h/p and and face sheet for DME.  Patient chose [facility] for hhpt.  I spoke with Dr. Victorino Dike and he states there are to be no dressing changes until patient sees him in the office in about a week.  I informed patient of this information.  Patient 's nephew will be picking her up and they will go to [place] on [address] to pick up DME because they will not deliver to hospital.

## 2011-12-31 NOTE — Op Note (Signed)
NAMEMALLERY, HARSHMAN              ACCOUNT NO.:  [id number]  MEDICAL RECORD NO.:  [id number]  LOCATION:                                 FACILITY:  PHYSICIAN:  Toni Arthurs, MD        DATE OF BIRTH:  12/28/1955  DATE OF PROCEDURE:  12/30/2011 DATE OF DISCHARGE:                              OPERATIVE REPORT   PREOPERATIVE DIAGNOSIS:  Left foot diabetic wound.  POSTOP DIAGNOSIS:  Left foot diabetic wound.  PROCEDURE:  Left foot first ray amputation.  SURGEON:  Toni Arthurs, MD.  ANESTHESIA:  General.  ESTIMATED BLOOD LOSS:  Minimal.  TOURNIQUET TIME:  Zero.  COMPLICATIONS:  None apparent.  DISPOSITION:  Extubated in awake and stable to recovery.  INDICATION FOR PROCEDURE:  The patient is a 56 year old woman with past medical history significant for diabetes.  She is status post I and D x2 for left foot diabetic abscess.  She does not have a viable hallux and presents now for left first ray amputation.  She understands the risks, benefits, the alternative treatment options and elects surgical treatment.  This is a planned return to the operating room for a staged treatment of this condition.  She specifically understands risks of bleeding, infection, nerve damage, blood clots, need for additional surgery, revision amputation, and death.  PROCEDURE IN DETAIL:  After preoperative consent was obtained, the correct operative site was identified.  The patient was brought to the operating room and placed in supine on the operating table.  General anesthesia was induced.  The patient was already on therapeutic antibiotics.  Surgical time-out was taken.  Left lower extremity was prepped and draped in standard sterile fashion.  The patient's wound was identified and was noted to have some necrotic material around the edges of the wound particularly at the hallux.  The hallux itself was rather dusky in appearance and had almost no viable skin distal to the MP joint, except 1 area on  the medial aspect of the forefoot that appeared reasonably healthy and well perfused.  The decision was made to proceed with the first ray amputation and used flap of skin from the hallux to close the dorsal wound.  A transverse incision was made around the remaining skin of the hallux.  It was then released subperiosteally from the first ray well up onto the first metatarsal shaft.  Subperiosteal dissection was carried around the first metatarsal shaft circumferentially.  An oscillating saw was used to cut through the metatarsal proximally at roughly the junction of the middle and proximal thirds.  The cut was beveled plantarly and medially.  A rasp was used to smooth the edges.  At this point, a circumferential excision of the necrotic material was carried out from the level of the skin down to the level of the bone.  The wound was irrigated copiously.  The flap of tissue, plantar, and medial was then swung up over the dorsal wound. This was closed with simple sutures of 2-0 nylon.  Hemostasis was achieved prior to closure.  Sterile dressings were applied followed by compression wrap.  The patient was then awakened by Anesthesia and transported to the recovery room in stable  condition.  FOLLOWUP PLAN:  The patient will be weightbearing as tolerated on her left foot in a hard-sole shoe.  She will keep it elevated as much as possible.  She will likely require IV antibiotics for couple of weeks.     Toni Arthurs, MD     JH/MEDQ  D:  12/30/2011  T:  12/31/2011  Job:  [id number]

## 2011-12-31 NOTE — Progress Notes (Signed)
Pt alert oriented, dsg to left foot dry and intact, wiggles toes of left foot with warmth and good color to toes.  Pt has been oob today with phy therapy walking with special shoe did well.  Discussed all discharge instructions with patient including medications followup appointments dsg care, given out of work note, Rxs and s/s of when to call MD.  Copy of all instructions given to pt.  Covered with insulin prior to discharge, pt was able to eat most of meal.  Discharged with nephew via wheelchair to home, home health will deliver equipment to her home.

## 2011-12-31 NOTE — Discharge Summary (Signed)
Physician Discharge Summary  [name]:[id number] DOB: 03-18-1956 DOA: 12/23/2011  PCP: [name], MD  Admit date: 12/23/2011 Discharge date: 12/31/2011  Recommendations for Outpatient Follow-up:  1.  Please keep dressing clean, dry and intact as per orthopedics instructions.  Please continue antibiotics until gone unless notified by your surgeon.  Please take pain medication with stool softener to prevent constipation Please follow up with your orthopedic surgeon in 2 weeks, August 23rd at 9:30AM.     Discharge Diagnoses:  Principal Problem:  *Diabetic ulcer of left foot Active Problems:  HYPERLIPIDEMIA  HYPERTENSION  CVA (cerebral infarction)  Irritable bowel syndrome  DM type 2, uncontrolled, with neuropathy  Syncope  TIA (transient ischemic attack)  Cellulitis of left lower extremity  Hypoglycemia associated with diabetes   Discharge Condition:  Stable improved  Diet recommendation: Diabetic, healthy heart  Wt Readings from Last 3 Encounters:  12/23/11 101.787 kg (224 lb 6.4 oz)  12/23/11 101.787 kg (224 lb 6.4 oz)  12/23/11 101.787 kg (224 lb 6.4 oz)    History of present illness:  Patient is well-known to me from a hospitalization last week when she had a CVA. She presents today with redness and swelling of the left foot. The patient states that the callus under her great toe was essentially unremarkable as noted last week. She states that she went home and then went to work on Monday and Tuesday. Her last 48 hours she noted that she was having more pain at the area of the callus and then noted an area of darkness at the dorsal surface of her foot as well as increasing warmth erythema and swelling. She denies any systemic fevers. Thus she came to the emergency room for further evaluation and management. The patient denies any known trauma to her foot but does have some loss of sensation on the plantar surface of both of her feet secondary to diabetic neuropathy.  In  the emergency room the patient was found to have a markedly elevated white blood cell count 22.3, as well as findings consistent with possible diabetic foot abscess. Breasts admit the patient for further management.   Hospital Course:  The patient was started on antibiotics and orthopedics was consulted.  She underwent amputation of her left foot 1st ray on 12/30/11 without complication.    Procedures:  Amputation of the 1st ray of the left foot 12/30/11  Consultations:  Orthopedic surgery  Discharge Exam: Filed Vitals:   12/31/11 0558  BP: 103/53  Pulse: 75  Temp: 97.6 F (36.4 C)  Resp: 16   Filed Vitals:   12/30/11 1725 12/30/11 2203 12/31/11 0435 12/31/11 0558  BP: 168/82 145/81 154/90 103/53  Pulse: 86 79 93 75  Temp: 97.4 F (36.3 C) 98.4 F (36.9 C) 98.7 F (37.1 C) 97.6 F (36.4 C)  TempSrc: Oral Oral Oral Oral  Resp: 20 18 16 16   Height:      Weight:      SpO2: 97% 99% 98% 98%    General: No acute distress Cardiovascular: Regular rate and rhythm Respiratory: CTAB Ext:  Left foot bandaged with post-operative dressing which is clean and dry.  No erythema extending above dressing.    Discharge Instructions   Medication List  As of 12/31/2011  1:01 PM   TAKE these medications         acetaminophen 325 MG tablet   Commonly known as: TYLENOL   Take 650 mg by mouth every 6 (six) hours as needed. pain  clindamycin 300 MG capsule   Commonly known as: CLEOCIN   Take 2 capsules (600 mg total) by mouth 3 (three) times daily.      clopidogrel 75 MG tablet   Commonly known as: PLAVIX   Take 1 tablet (75 mg total) by mouth daily with breakfast.      docusate sodium 100 MG capsule   Commonly known as: COLACE   Take 1 capsule (100 mg total) by mouth 2 (two) times daily.      glimepiride 4 MG tablet   Commonly known as: AMARYL   Take 4 mg by mouth daily before breakfast.      HYDROcodone-acetaminophen 5-325 MG per tablet   Commonly known as:  NORCO/VICODIN   Take 1 tablet by mouth every 4 (four) hours as needed for pain (Do not take with tylenol).      insulin aspart 100 UNIT/ML injection   Commonly known as: novoLOG   Inject 6 Units into the skin 3 (three) times daily with meals.      insulin glargine 100 UNIT/ML injection   Commonly known as: LANTUS   Inject 20 Units into the skin at bedtime.      lisinopril 20 MG tablet   Commonly known as: PRINIVIL,ZESTRIL   Take 20 mg by mouth daily.      metFORMIN 500 MG tablet   Commonly known as: GLUCOPHAGE   Take 500 mg by mouth 2 (two) times daily with a meal.      simvastatin 40 MG tablet   Commonly known as: ZOCOR   Take 40 mg by mouth every evening.           Follow-up Information    Follow up with Toni Arthurs, MD on 01/09/2012. (9:30AM)    Contact information:   [address], [address] [place] [address] [phone]       Follow up with [name], MD. Schedule an appointment as soon as possible for a visit in 1 week.   Contact information:   [address] [place] [address] [phone]           The results of significant diagnostics from this hospitalization (including imaging, microbiology, ancillary and laboratory) are listed below for reference.    Significant Diagnostic Studies: Dg Chest 2 View  12/15/2011  *RADIOLOGY REPORT*  Clinical Data: Dizziness  CHEST - 2 VIEW  Comparison: 11/26/2011  Findings: Normal heart size.  Under aerated and grossly clear lungs.  No pneumothorax.  No pleural effusions.  Stable thoracic spine.  IMPRESSION: No active cardiopulmonary disease.  Original Report Authenticated By: Donavan Burnet, M.D.   Ct Head Wo Contrast  12/23/2011  *RADIOLOGY REPORT*  Clinical Data: Right frontal headaches  CT HEAD WITHOUT CONTRAST  Technique:  Contiguous axial images were obtained from the base of the skull through the vertex without contrast.  Comparison: July 01, 2011  Findings: The ventricles  are normal in size, shape, and position. There is no mass effect or midline shift.  No acute hemorrhage or abnormal extra-axial fluid collections are identified.  The gray/white differentiation is normal.  The orbits, calvarium, visualized paranasal sinuses have a normal appearance.  IMPRESSION: Normal CT scan of the head with no evidence of acute intracranial abnormality.  Original Report Authenticated By: Brandon Melnick, M.D.   Ct Head Wo Contrast  12/15/2011  *RADIOLOGY REPORT*  Clinical Data: Dizziness.  Nausea.  Headache.  Left arm numbness.  CT HEAD WITHOUT CONTRAST  Technique:  Contiguous axial  images were obtained from the base of the skull through the vertex without contrast.  Comparison: 07/02/2011  Findings: The brain shows generalized atrophy.  No sign of old or acute focal infarction, mass lesion, hemorrhage, hydrocephalus or extra-axial collection.  The calvarium is unremarkable.  Sinuses, middle ears and mastoids are clear.  IMPRESSION: Negative head CT.  Mild generalized atrophy.  No focal or acute finding.  Original Report Authenticated By: Thomasenia Sales, M.D.   Mri Brain Without Contrast  12/16/2011  *RADIOLOGY REPORT*  Clinical Data:  Left sided numbness.  Facial droop  MRI HEAD WITHOUT CONTRAST MRA HEAD WITHOUT CONTRAST  Technique:  Multiplanar, multiecho pulse sequences of the brain and surrounding structures were obtained without intravenous contrast. Angiographic images of the head were obtained using MRA technique without contrast.  Comparison:  CT 12/15/2011  MRI HEAD  Findings:  Sub centimeter acute infarct in the right frontal white matter.  No other areas of acute infarct.  Mild chronic microvascular ischemic change in the white matter. Ventricle size is normal.  Negative for hemorrhage or mass. Paranasal sinuses are clear.  IMPRESSION: Acute infarct in the right frontal white matter adjacent to the frontal horn of the lateral ventricle.  MRA HEAD  Findings: Left vertebral artery  is dominant.  Small right vertebral artery with a moderate stenosis distally.  Mild irregularity the basilar due to atherosclerotic disease.  PICA is patent bilaterally.  Superior cerebellar and posterior cerebral arteries are patent.  Fetal origin of the posterior cerebral artery bilaterally.  Basilar artery ends in the superior cerebellar arteries.  Internal carotid artery is patent bilaterally with mild atherosclerotic disease in the cavernous segment.  Anterior and middle cerebral arteries are patent bilaterally.  Mild irregularity of the middle cerebral artery branches bilaterally suggesting atherosclerotic disease.  Negative for cerebral aneurysm.  IMPRESSION: Mild to moderate intracranial atherosclerotic disease without large vessel occlusion.  Original Report Authenticated By: Camelia Phenes, M.D.   Dg Foot Complete Left  12/23/2011  *RADIOLOGY REPORT*  Clinical Data: Ulcer left foot.  LEFT FOOT - COMPLETE 3+ VIEW  Comparison: 09/04/2011.  Findings: There is soft tissue gas at the base of the left great toe and extending between the first and second metatarsals compatible with infection.  No radiographic changes of osteomyelitis at this time.  Old deformity and destruction of the left fifth phalanges, stable.  IMPRESSION: Extensive soft tissue gas in the base of the left great toe and extending between the first and second metatarsals compatible with soft tissue infection.  No radiographic changes of acute osteomyelitis.  Original Report Authenticated By: Cyndie Chime, M.D.   Mr Mra Head/brain Wo Cm  12/16/2011  *RADIOLOGY REPORT*  Clinical Data:  Left sided numbness.  Facial droop  MRI HEAD WITHOUT CONTRAST MRA HEAD WITHOUT CONTRAST  Technique:  Multiplanar, multiecho pulse sequences of the brain and surrounding structures were obtained without intravenous contrast. Angiographic images of the head were obtained using MRA technique without contrast.  Comparison:  CT 12/15/2011  MRI HEAD  Findings:   Sub centimeter acute infarct in the right frontal white matter.  No other areas of acute infarct.  Mild chronic microvascular ischemic change in the white matter. Ventricle size is normal.  Negative for hemorrhage or mass. Paranasal sinuses are clear.  IMPRESSION: Acute infarct in the right frontal white matter adjacent to the frontal horn of the lateral ventricle.  MRA HEAD  Findings: Left vertebral artery is dominant.  Small right vertebral artery with a moderate  stenosis distally.  Mild irregularity the basilar due to atherosclerotic disease.  PICA is patent bilaterally.  Superior cerebellar and posterior cerebral arteries are patent.  Fetal origin of the posterior cerebral artery bilaterally.  Basilar artery ends in the superior cerebellar arteries.  Internal carotid artery is patent bilaterally with mild atherosclerotic disease in the cavernous segment.  Anterior and middle cerebral arteries are patent bilaterally.  Mild irregularity of the middle cerebral artery branches bilaterally suggesting atherosclerotic disease.  Negative for cerebral aneurysm.  IMPRESSION: Mild to moderate intracranial atherosclerotic disease without large vessel occlusion.  Original Report Authenticated By: Camelia Phenes, M.D.    Microbiology: Recent Results (from the past 240 hour(s))  SURGICAL PCR SCREEN     Status: Normal   Collection Time   12/23/11  7:43 PM      Component Value Range Status Comment   MRSA, PCR NEGATIVE  NEGATIVE Final    Staphylococcus aureus NEGATIVE  NEGATIVE Final   AFB CULTURE WITH SMEAR     Status: Normal (Preliminary result)   Collection Time   12/23/11 11:05 PM      Component Value Range Status Comment   Specimen Description WOUND LEFT FOOT   Final    Special Requests NONE   Final    ACID FAST SMEAR NO ACID FAST BACILLI SEEN   Final    Culture     Final    Value: CULTURE WILL BE EXAMINED FOR 6 WEEKS BEFORE ISSUING A FINAL REPORT   Report Status PENDING   Incomplete   ANAEROBIC CULTURE      Status: Normal   Collection Time   12/23/11 11:05 PM      Component Value Range Status Comment   Specimen Description WOUND LEFT FOOT   Final    Special Requests NONE   Final    Gram Stain     Final    Value: FEW WBC PRESENT,BOTH PMN AND MONONUCLEAR     NO SQUAMOUS EPITHELIAL CELLS SEEN     ABUNDANT GRAM POSITIVE COCCI IN PAIRS   Culture NO ANAEROBES ISOLATED   Final    Report Status 12/28/2011 FINAL   Final   WOUND CULTURE     Status: Normal   Collection Time   12/23/11 11:05 PM      Component Value Range Status Comment   Specimen Description WOUND LEFT FOOT   Final    Special Requests NONE   Final    Gram Stain     Final    Value: FEW WBC PRESENT,BOTH PMN AND MONONUCLEAR     NO SQUAMOUS EPITHELIAL CELLS SEEN     ABUNDANT GRAM POSITIVE COCCI IN PAIRS   Culture     Final    Value: MULTIPLE ORGANISMS PRESENT, NONE PREDOMINANT     Note: NO STAPHYLOCOCCUS AUREUS ISOLATED NO GROUP A STREP (S.PYOGENES) ISOLATED   Report Status 12/26/2011 FINAL   Final      Labs: Basic Metabolic Panel:  Lab 12/28/11 9604 12/25/11 1949  NA 140 137  K 3.4* 3.4*  CL 102 99  CO2 26 25  GLUCOSE 134* 237*  BUN 8 10  CREATININE 0.83 0.99  CALCIUM 8.3* 8.8  MG -- --  PHOS -- --   Liver Function Tests:  Lab 12/25/11 1949  AST 34  ALT 40*  ALKPHOS 181*  BILITOT 0.3  PROT 7.5  ALBUMIN 2.6*   No results found for this basename: LIPASE:5,AMYLASE:5 in the last 168 hours No results found for this basename:  AMMONIA:5 in the last 168 hours CBC:  Lab 12/25/11 1949  WBC 12.9*  NEUTROABS --  HGB 11.3*  HCT 34.0*  MCV 84.6  PLT 268   Cardiac Enzymes: No results found for this basename: CKTOTAL:5,CKMB:5,CKMBINDEX:5,TROPONINI:5 in the last 168 hours BNP: BNP (last 3 results) No results found for this basename: PROBNP:3 in the last 8760 hours CBG:  Lab 12/31/11 1206 12/31/11 0810 12/30/11 2157 12/30/11 1649 12/30/11 1406  GLUCAP 191* 149* 188* 115* 128*    Time coordinating discharge: 30  minutes  Signed:  Jasmyn Picha  [facility] 12/31/2011, 1:01 PM

## 2012-01-01 ENCOUNTER — Encounter (HOSPITAL_COMMUNITY): Payer: Self-pay | Admitting: Orthopedic Surgery

## 2012-01-07 ENCOUNTER — Emergency Department (HOSPITAL_COMMUNITY): Payer: Managed Care, Other (non HMO)

## 2012-01-07 ENCOUNTER — Inpatient Hospital Stay (HOSPITAL_COMMUNITY)
Admission: EM | Admit: 2012-01-07 | Discharge: 2012-01-08 | DRG: 313 | Disposition: A | Discharge status: Home / Self Care | Payer: Managed Care, Other (non HMO) | Attending: Internal Medicine | Admitting: Internal Medicine

## 2012-01-07 ENCOUNTER — Inpatient Hospital Stay (HOSPITAL_COMMUNITY): Payer: Managed Care, Other (non HMO)

## 2012-01-07 ENCOUNTER — Encounter (HOSPITAL_COMMUNITY): Payer: Self-pay

## 2012-01-07 DIAGNOSIS — Z9089 Acquired absence of other organs: Secondary | ICD-10-CM

## 2012-01-07 DIAGNOSIS — S98139A Complete traumatic amputation of one unspecified lesser toe, initial encounter: Secondary | ICD-10-CM

## 2012-01-07 DIAGNOSIS — E785 Hyperlipidemia, unspecified: Secondary | ICD-10-CM | POA: Diagnosis present

## 2012-01-07 DIAGNOSIS — E669 Obesity, unspecified: Secondary | ICD-10-CM | POA: Diagnosis present

## 2012-01-07 DIAGNOSIS — E1165 Type 2 diabetes mellitus with hyperglycemia: Secondary | ICD-10-CM

## 2012-01-07 DIAGNOSIS — Z79899 Other long term (current) drug therapy: Secondary | ICD-10-CM

## 2012-01-07 DIAGNOSIS — Z6834 Body mass index (BMI) 34.0-34.9, adult: Secondary | ICD-10-CM

## 2012-01-07 DIAGNOSIS — Z794 Long term (current) use of insulin: Secondary | ICD-10-CM

## 2012-01-07 DIAGNOSIS — I10 Essential (primary) hypertension: Secondary | ICD-10-CM | POA: Diagnosis present

## 2012-01-07 DIAGNOSIS — Z8673 Personal history of transient ischemic attack (TIA), and cerebral infarction without residual deficits: Secondary | ICD-10-CM

## 2012-01-07 DIAGNOSIS — R42 Dizziness and giddiness: Secondary | ICD-10-CM

## 2012-01-07 DIAGNOSIS — IMO0002 Reserved for concepts with insufficient information to code with codable children: Secondary | ICD-10-CM | POA: Diagnosis present

## 2012-01-07 DIAGNOSIS — I509 Heart failure, unspecified: Secondary | ICD-10-CM | POA: Diagnosis present

## 2012-01-07 DIAGNOSIS — E1142 Type 2 diabetes mellitus with diabetic polyneuropathy: Secondary | ICD-10-CM

## 2012-01-07 DIAGNOSIS — E1140 Type 2 diabetes mellitus with diabetic neuropathy, unspecified: Secondary | ICD-10-CM | POA: Diagnosis present

## 2012-01-07 DIAGNOSIS — R079 Chest pain, unspecified: Secondary | ICD-10-CM

## 2012-01-07 DIAGNOSIS — Z7902 Long term (current) use of antithrombotics/antiplatelets: Secondary | ICD-10-CM

## 2012-01-07 DIAGNOSIS — Z880 Allergy status to penicillin: Secondary | ICD-10-CM

## 2012-01-07 DIAGNOSIS — I2510 Atherosclerotic heart disease of native coronary artery without angina pectoris: Secondary | ICD-10-CM | POA: Diagnosis present

## 2012-01-07 DIAGNOSIS — R0789 Other chest pain: Principal | ICD-10-CM | POA: Diagnosis present

## 2012-01-07 DIAGNOSIS — Z8249 Family history of ischemic heart disease and other diseases of the circulatory system: Secondary | ICD-10-CM

## 2012-01-07 DIAGNOSIS — F17200 Nicotine dependence, unspecified, uncomplicated: Secondary | ICD-10-CM | POA: Diagnosis present

## 2012-01-07 DIAGNOSIS — K449 Diaphragmatic hernia without obstruction or gangrene: Secondary | ICD-10-CM | POA: Diagnosis present

## 2012-01-07 DIAGNOSIS — I5020 Unspecified systolic (congestive) heart failure: Secondary | ICD-10-CM | POA: Diagnosis present

## 2012-01-07 DIAGNOSIS — K219 Gastro-esophageal reflux disease without esophagitis: Secondary | ICD-10-CM | POA: Diagnosis present

## 2012-01-07 DIAGNOSIS — E1149 Type 2 diabetes mellitus with other diabetic neurological complication: Secondary | ICD-10-CM | POA: Diagnosis present

## 2012-01-07 LAB — CBC
HCT: 40.5 % (ref 36.0–46.0)
Hemoglobin: 13.3 g/dL (ref 12.0–15.0)
MCHC: 32.8 g/dL (ref 30.0–36.0)
MCHC: 32.2 g/dL (ref 30.0–36.0)
RBC: 4.79 MIL/uL (ref 3.87–5.11)
RDW: 15 % (ref 11.5–15.5)

## 2012-01-07 LAB — BASIC METABOLIC PANEL
BUN: 13 mg/dL (ref 6–23)
CO2: 26 mEq/L (ref 19–32)
GFR calc non Af Amer: 77 mL/min — ABNORMAL LOW (ref >90)
Glucose, Bld: 178 mg/dL — ABNORMAL HIGH (ref 70–99)
Potassium: 4 mEq/L (ref 3.5–5.1)
Sodium: 139 mEq/L (ref 135–145)

## 2012-01-07 LAB — POCT I-STAT TROPONIN I: Troponin i, poc: 0.01 ng/mL (ref 0.00–0.08)

## 2012-01-07 LAB — CARDIAC PANEL(CRET KIN+CKTOT+MB+TROPI)
CK, MB: 1.2 ng/mL (ref 0.3–4.0)
Relative Index: INVALID (ref 0.0–2.5)
Total CK: 32 U/L (ref 7–177)
Troponin I: <0.3 ng/mL (ref <0.30)

## 2012-01-07 LAB — CREATININE, SERUM
Creatinine, Ser: 0.82 mg/dL (ref 0.50–1.10)
GFR calc non Af Amer: 78 mL/min — ABNORMAL LOW (ref >90)

## 2012-01-07 MED ORDER — HYDRALAZINE HCL 20 MG/ML IJ SOLN
10.0000 mg | Freq: Three times a day (TID) | INTRAMUSCULAR | Status: DC | PRN
  Filled 2012-01-07: qty 0.5

## 2012-01-07 MED ORDER — ENOXAPARIN SODIUM 100 MG/ML ~~LOC~~ SOLN
100.0000 mg | Freq: Two times a day (BID) | SUBCUTANEOUS | Status: DC
  Administered 2012-01-07: 100 mg via SUBCUTANEOUS
  Filled 2012-01-07 (×3): qty 1

## 2012-01-07 MED ORDER — ENOXAPARIN SODIUM 40 MG/0.4ML ~~LOC~~ SOLN
40.0000 mg | SUBCUTANEOUS | Status: DC
  Filled 2012-01-07: qty 0.4

## 2012-01-07 MED ORDER — SODIUM CHLORIDE 0.9 % IJ SOLN
3.0000 mL | Freq: Two times a day (BID) | INTRAMUSCULAR | Status: DC
  Administered 2012-01-07 – 2012-01-08 (×2): 3 mL via INTRAVENOUS

## 2012-01-07 MED ORDER — ACETAMINOPHEN 650 MG RE SUPP: 650.0000 mg | Freq: Four times a day (QID) | RECTAL | Status: DC | PRN

## 2012-01-07 MED ORDER — GI COCKTAIL ~~LOC~~: 30.0000 mL | Freq: Once | ORAL | Status: DC

## 2012-01-07 MED ORDER — CLOPIDOGREL BISULFATE 75 MG PO TABS
75.0000 mg | ORAL_TABLET | Freq: Every day | ORAL | Status: DC
  Administered 2012-01-08: 75 mg via ORAL
  Filled 2012-01-07 (×2): qty 1

## 2012-01-07 MED ORDER — NITROGLYCERIN 0.4 MG SL SUBL: 0.4000 mg | SUBLINGUAL_TABLET | SUBLINGUAL | Status: DC | PRN

## 2012-01-07 MED ORDER — GLIMEPIRIDE 4 MG PO TABS
4.0000 mg | ORAL_TABLET | Freq: Every day | ORAL | Status: DC
  Administered 2012-01-07 – 2012-01-08 (×2): 4 mg via ORAL
  Filled 2012-01-07 (×2): qty 1

## 2012-01-07 MED ORDER — HYDROCODONE-ACETAMINOPHEN 5-325 MG PO TABS
1.0000 | ORAL_TABLET | ORAL | Status: DC | PRN
  Administered 2012-01-07 – 2012-01-08 (×2): 1 via ORAL
  Filled 2012-01-07 (×2): qty 1

## 2012-01-07 MED ORDER — ACETAMINOPHEN 325 MG PO TABS: 650.0000 mg | ORAL_TABLET | Freq: Four times a day (QID) | ORAL | Status: DC | PRN

## 2012-01-07 MED ORDER — INSULIN ASPART 100 UNIT/ML ~~LOC~~ SOLN
6.0000 [IU] | Freq: Three times a day (TID) | SUBCUTANEOUS | Status: DC
  Administered 2012-01-08 (×3): 6 [IU] via SUBCUTANEOUS

## 2012-01-07 MED ORDER — SODIUM CHLORIDE 0.9 % IV SOLN: INTRAVENOUS | Status: DC

## 2012-01-07 MED ORDER — DOCUSATE SODIUM 100 MG PO CAPS
100.0000 mg | ORAL_CAPSULE | Freq: Two times a day (BID) | ORAL | Status: DC
  Administered 2012-01-07: 100 mg via ORAL
  Filled 2012-01-07 (×3): qty 1

## 2012-01-07 MED ORDER — GI COCKTAIL ~~LOC~~
30.0000 mL | Freq: Once | ORAL | Status: AC
  Administered 2012-01-07: 30 mL via ORAL
  Filled 2012-01-07: qty 30

## 2012-01-07 MED ORDER — INSULIN ASPART 100 UNIT/ML ~~LOC~~ SOLN
0.0000 [IU] | Freq: Three times a day (TID) | SUBCUTANEOUS | Status: DC
  Administered 2012-01-08 (×2): 1 [IU] via SUBCUTANEOUS
  Administered 2012-01-08: 2 [IU] via SUBCUTANEOUS

## 2012-01-07 MED ORDER — SIMVASTATIN 40 MG PO TABS
40.0000 mg | ORAL_TABLET | Freq: Every evening | ORAL | Status: DC
Start: 2012-01-07 — End: 2012-01-08
  Administered 2012-01-07 – 2012-01-08 (×2): 40 mg via ORAL
  Filled 2012-01-07 (×2): qty 1

## 2012-01-07 MED ORDER — GADOBENATE DIMEGLUMINE 529 MG/ML IV SOLN
20.0000 mL | Freq: Once | INTRAVENOUS | Status: AC
  Administered 2012-01-07: 20 mL via INTRAVENOUS

## 2012-01-07 MED ORDER — ASPIRIN EC 81 MG PO TBEC
81.0000 mg | DELAYED_RELEASE_TABLET | Freq: Every day | ORAL | Status: DC
  Filled 2012-01-07: qty 1

## 2012-01-07 MED ORDER — INSULIN GLARGINE 100 UNIT/ML ~~LOC~~ SOLN
20.0000 [IU] | Freq: Every day | SUBCUTANEOUS | Status: DC
  Administered 2012-01-07: 20 [IU] via SUBCUTANEOUS

## 2012-01-07 MED ORDER — MORPHINE SULFATE 2 MG/ML IJ SOLN: 1.0000 mg | INTRAMUSCULAR | Status: DC | PRN

## 2012-01-07 NOTE — ED Provider Notes (Signed)
History     CSN: [id number]  Arrival date & time 01/07/12  1112   First MD Initiated Contact with Patient 01/07/12 1401      Chief Complaint  Patient presents with  . Chest Pain    HPI 56 yo female with h/o non obstructive CAD, CVA, DM2, HLD who presents with (L) side chest pain, nausea, dizziness. Pain is pressure like, throbbing and starts in the middle of her chest and radiates to the left side. It started at 8 this morning while she was sitting up from bed. Pain was not worst with walking or activity. Pain never subsided completely. She denies any significant shortness of breath. No diaphoresis. Had some nausea but no vomiting. Pt denies back/abd pain, V/D, congestion or fever. 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 She also had a TIA 2 weeks ago.  No neuro deficits noted in triage. Smokes 1 pack per week.   Past Medical History  Diagnosis Date  . Coronary atherosclerosis of native coronary artery     non obstructive by cath in 2008 and 2010  . Unspecified essential hypertension   . Other and unspecified hyperlipidemia   . Syncope and collapse     near-syncopal episode in November/2008  . Unspecified cardiovascular disease     Decreased left-ventricular fxn  . 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 Unspecified hereditary and idiopathic peripheral neuropathy   . Obesity, unspecified   . Esophageal reflux   . History of diabetes mellitus, type II   . Diaphragmatic hernia without mention of obstruction or gangrene   . Irritable bowel syndrome   . Morbid obesity   . Anginal pain   . Diabetes mellitus   . Stroke   . Cellulitis     left foot    Past Surgical History  Procedure Date  . Invasive electrophysiologic study 5/09    followed by insertion of an implantable loop recorder. S/p removal   . Colonoscopy   . Cholecystectomy   . Multiple toe surgeries   . Cardiac  catheterization     LAD 30%, circumflex 50%, OM 75%, RI 60% with small branch 80%, dominant RCA 60%, EF 45-50%  . I&d extremity 12/23/2011    Procedure: IRRIGATION AND DEBRIDEMENT EXTREMITY;  Surgeon: Verlee Rossetti, MD;  Location: [facility] OR;  Service: Orthopedics;  Laterality: Left;  Left Foot  . I&d extremity 12/26/2011    Procedure: IRRIGATION AND DEBRIDEMENT EXTREMITY;  Surgeon: Toni Arthurs, MD;  Location: [facility] OR;  Service: Orthopedics;  Laterality: Left;  LEFT FOOT I&D WITH POSSIBLE WOUND VAC APPLICATION, POSSIBLE LEFT FIRST RAY AMPUTATION  . Amputation 12/30/2011    Procedure: AMPUTATION RAY;  Surgeon: Toni Arthurs, MD;  Location: [facility] OR;  Service: Orthopedics;  Laterality: Left;  LEFT FIRST RAY AMPUTATION    Family History  Problem Relation Age of Onset  . Pneumonia Mother   . Gallbladder disease Mother     cancer  . Heart failure Mother   . Diabetes Father   . Coronary artery disease Father   . Stroke Neg Hx     History  Substance Use Topics  . Smoking status: Current Everyday Smoker -- 0.5 packs/day for 30 years    Types: Cigarettes  . Smokeless tobacco: Never Used  . Alcohol Use: 1.2 oz/week    2 Glasses of wine per week     occ  Review of Systems  All other systems reviewed and are negative.    Allergies  Banana; Penicillins; and Strawberry  Home Medications   Current Outpatient Rx  Name Route Sig Dispense Refill  . CLINDAMYCIN HCL 300 MG PO CAPS Oral Take 600 mg by mouth 3 (three) times daily. For 7 days Started 8/14    . CLOPIDOGREL BISULFATE 75 MG PO TABS Oral Take 1 tablet (75 mg total) by mouth daily with breakfast. 30 tablet 3  . DOCUSATE SODIUM 100 MG PO CAPS Oral Take 1 capsule (100 mg total) by mouth 2 (two) times daily. 20 capsule 0  . GLIMEPIRIDE 4 MG PO TABS Oral Take 4 mg by mouth daily.     Marland Kitchen HYDROCODONE-ACETAMINOPHEN 5-325 MG PO TABS Oral Take 1 tablet by mouth every 4 (four) hours as needed. For pain    . INSULIN ASPART 100 UNIT/ML [place] SOLN  Subcutaneous Inject 6 Units into the skin 3 (three) times daily with meals. 1 vial 3    Please also provide insulin syringes # 120 with PR ...  . INSULIN GLARGINE 100 UNIT/ML [place] SOLN Subcutaneous Inject 20 Units into the skin at bedtime.    Marland Kitchen LISINOPRIL 20 MG PO TABS Oral Take 20 mg by mouth daily.    Marland Kitchen METFORMIN HCL 500 MG PO TABS Oral Take 500 mg by mouth 2 (two) times daily with a meal.    . SIMVASTATIN 40 MG PO TABS Oral Take 40 mg by mouth every evening.      BP 153/86  Pulse 89  Temp 97.8 F (36.6 C) (Oral)  Resp 20  Ht 5\' 6"  (1.676 m)  Wt 210 lb (95.255 kg)  BMI 33.89 kg/m2  SpO2 100%  Physical Exam  Constitutional: She is oriented to person, place, and time. No distress.  HENT:  Head: Normocephalic.  Mouth/Throat: Oropharynx is clear and moist.  Eyes: EOM are normal. Pupils are equal, round, and reactive to light.  Neck: Normal range of motion. Neck supple.  Cardiovascular: Normal rate, regular rhythm and normal heart sounds.   Pulmonary/Chest: Effort normal and breath sounds normal. No respiratory distress. She has no wheezes. She exhibits no tenderness.  Abdominal: Soft. Bowel sounds are normal. She exhibits no distension. There is no rebound and no guarding.       Minimal tenderness along left quadrant, patient states is chronic.   Musculoskeletal: Normal range of motion. She exhibits no edema.  Neurological: She is alert and oriented to person, place, and time. No cranial nerve deficit.  Skin: Skin is warm and dry. She is not diaphoretic.    ED Course  Procedures (including critical care time)   Date: 01/07/2012  Rate: 108  Rhythm: normal sinus rhythm with occasional PVC's  QRS Axis: normal  Intervals: normal  ST/T Wave abnormalities: normal  Conduction Disutrbances: none  Old EKG Reviewed: 12/17/11 No significant changes noted    Labs Reviewed  CBC - Abnormal; Notable for the following:    WBC 10.9 (*)     Platelets 431 (*)     All other components  within normal limits  BASIC METABOLIC PANEL - Abnormal; Notable for the following:    Glucose, Bld 178 (*)     GFR calc non Af Amer 77 (*)     GFR calc Af Amer 89 (*)     All other components within normal limits  PRO B NATRIURETIC PEPTIDE - Abnormal; Notable for the following:    Pro B Natriuretic peptide (BNP)  750.5 (*)     All other components within normal limits  POCT I-STAT TROPONIN I   Dg Chest 2 View  01/07/2012  *RADIOLOGY REPORT*  Clinical Data: Chest pain, dizziness.  CHEST - 2 VIEW  Comparison: 12/15/2011  Findings: Heart and mediastinal contours are within normal limits. No focal opacities or effusions.  No acute bony abnormality.  IMPRESSION: No active cardiopulmonary disease.   Original Report Authenticated By: Cyndie Chime, M.D.      1. Chest pain      MDM  Patient with substernal chest pain and multiple risk factors (DM, HTN, smoker, CAD). Troponins and EKG are normal but given risk factors will admit to medicine for ACS rule out.     Lonia Skinner, MD 01/07/12 1614  Lonia Skinner, MD 01/07/12 [phone]

## 2012-01-07 NOTE — Progress Notes (Signed)
ANTICOAGULATION CONSULT NOTE - Initial Consult  Pharmacy Consult for lovenox Indication: r/o PE  Allergies  Allergen Reactions  . Banana     Face puffs up  . Penicillins Itching     edema  . Strawberry     Face puffs up    Patient Measurements: Height: 5\' 6"  (167.6 cm) Weight: 217 lb 3.2 oz (98.521 kg) (bed scale) IBW/kg (Calculated) : 59.3  Heparin Dosing Weight: 98.5 kg  Vital Signs: Temp: 97.8 F (36.6 C) (08/21 2006) Temp src: Oral (08/21 2006) BP: 140/84 mmHg (08/21 2010) Pulse Rate: 119  (08/21 2010)  Labs:  Basename 01/07/12 1759 01/07/12 1126  HGB 12.4 13.3  HCT 38.5 40.5  PLT 413* 431*  APTT -- --  LABPROT -- --  INR -- --  HEPARINUNFRC -- --  CREATININE 0.82 0.83  CKTOTAL 32 --  CKMB 1.2 --  TROPONINI <0.30 --    Estimated Creatinine Clearance: 89.6 ml/min (by C-G formula based on Cr of 0.82).   Medical History: Past Medical History  Diagnosis Date  . Coronary atherosclerosis of native coronary artery     non obstructive by cath in 2008 and 2010  . Unspecified essential hypertension   . Other and unspecified hyperlipidemia   . Syncope and collapse     near-syncopal episode in November/2008  . Unspecified cardiovascular disease     Decreased left-ventricular fxn  . 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 Unspecified hereditary and idiopathic peripheral neuropathy   . Obesity, unspecified   . Esophageal reflux   . History of diabetes mellitus, type II   . Diaphragmatic hernia without mention of obstruction or gangrene   . Irritable bowel syndrome   . Morbid obesity   . Anginal pain   . Diabetes mellitus   . Stroke   . Cellulitis     left foot    Medications:  Scheduled:    . clopidogrel  75 mg Oral Q breakfast  . docusate sodium  100 mg Oral BID  . gi cocktail  30 mL Oral Once  . glimepiride  4 mg Oral Daily  . insulin aspart  0-9 Units Subcutaneous TID WC  . insulin aspart  6 Units  Subcutaneous TID WC  . insulin glargine  20 Units Subcutaneous QHS  . simvastatin  40 mg Oral QPM  . sodium chloride  3 mL Intravenous Q12H  . DISCONTD: aspirin EC  81 mg Oral Daily  . DISCONTD: enoxaparin (LOVENOX) injection  40 mg Subcutaneous Q24H  . DISCONTD: gi cocktail  30 mL Oral Once   Infusions:    . DISCONTD: sodium chloride Stopped (01/07/12 1745)    Assessment: 56 yo female with elevated D-dimer will be started on lovenox treatment dose for r/o PE.  Baseline H/H 12.4/38.5 and Plt 413.  MD states that will d/c lovenox if CT angio negative.  SCr 0.82 (CrCl ~90)  Goal of Therapy:  4hr anti-Xa level 0.6-1.2 Monitor platelets by anticoagulation protocol: Yes   Plan:  1) Lovenox 100mg  sq q12h 2) CBC q72 hours while on lovenox 3) Follow up on result of CT angio  Ronaldo Crilly, Tsz-Yin 01/07/2012,8:54 PM

## 2012-01-07 NOTE — ED Notes (Signed)
Pt reports (L) side chest pain, nausea, dizziness, SOB, non-productive cough starting this am. Pt denies back/abd pain, V/D, congestion or fever. Pt reports increase pain w/a deep breath. Pt d/c from our facility 1 week ago today, pt reports they amputated her (L) great toe d/t DM, pt reports decreased activity since returning home, pt reports having "a stroke" 2 weeks ago. No neuro deficits noted in triage.

## 2012-01-07 NOTE — H&P (Addendum)
[facility] History and Physical  [name] ZOX:[id number] DOB: Jun 02, 1955 DOA: 01/07/2012   PCP: [name], MD   Chief Complaint: Chest Pain.   HPI:  56 year old with PMH significant for Diabetes, Hypertension, hyperlipidemia, Systolic HF EF 45 % ECHO July 2013, recently discharge from hospital after left toe amputation who presents to ED complaining of chest pain that started the morning of admission. Patient relates that chest pain wake her up. Describes pain as throbbing, middle chest, intermittent, 9/10 initially, associated with dizziness and nausea. Chest pain is pleuritic, worse with deep breat. She denies Cough. No accompanied by SOB. She is chest pain free. She does relates still some dizziness, describes as everything moving around her. She denies focal weakness, slurred speech. She denies chest pain or SOB  on exertion.    Review of Systems:  Constitutional:  No weight loss, night sweats, Fevers, chills, fatigue.  HEENT:  No headaches, Difficulty swallowing,Tooth/dental problems,Sore throat,  No sneezing, itching, ear ache, nasal congestion, post nasal drip,  Cardio-vascular:   Orthopnea, PND, swelling in lower extremities, anasarca, , palpitations  GI:  No heartburn, indigestion, abdominal pain, nausea, vomiting, diarrhea, change in bowel habits, loss of appetite  Resp:  No shortness of breath with exertion or at rest. No excess mucus, no productive cough, No non-productive cough, No coughing up of blood.No change in color of mucus.No wheezing.No chest wall deformity  Skin:  no rash or lesions.  GU:  no dysuria, change in color of urine, no urgency or frequency. No flank pain.  Musculoskeletal:  No joint pain or swelling. No decreased range of motion. No back pain.  Psych:  No change in mood or affect. No depression or anxiety. No memory loss.    Past Medical History  Diagnosis Date  . Coronary atherosclerosis of native coronary artery     non  obstructive by cath in 2008 and 2010  . Unspecified essential hypertension   . Other and unspecified hyperlipidemia   . Syncope and collapse     near-syncopal episode in November/2008  . Unspecified cardiovascular disease     Decreased left-ventricular fxn  . 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 Unspecified hereditary and idiopathic peripheral neuropathy   . Obesity, unspecified   . Esophageal reflux   . History of diabetes mellitus, type II   . Diaphragmatic hernia without mention of obstruction or gangrene   . Irritable bowel syndrome   . Morbid obesity   . Anginal pain   . Diabetes mellitus   . Stroke   . Cellulitis     left foot   Past Surgical History  Procedure Date  . Invasive electrophysiologic study 5/09    followed by insertion of an implantable loop recorder. S/p removal   . Colonoscopy   . Cholecystectomy   . Multiple toe surgeries   . Cardiac catheterization     LAD 30%, circumflex 50%, OM 75%, RI 60% with small branch 80%, dominant RCA 60%, EF 45-50%  . I&d extremity 12/23/2011    Procedure: IRRIGATION AND DEBRIDEMENT EXTREMITY;  Surgeon: Verlee Rossetti, MD;  Location: [facility] OR;  Service: Orthopedics;  Laterality: Left;  Left Foot  . I&d extremity 12/26/2011    Procedure: IRRIGATION AND DEBRIDEMENT EXTREMITY;  Surgeon: Toni Arthurs, MD;  Location: [facility] OR;  Service: Orthopedics;  Laterality: Left;  LEFT FOOT I&D WITH POSSIBLE WOUND VAC APPLICATION, POSSIBLE LEFT FIRST RAY AMPUTATION  . Amputation 12/30/2011  Procedure: AMPUTATION RAY;  Surgeon: Toni Arthurs, MD;  Location: [facility] OR;  Service: Orthopedics;  Laterality: Left;  LEFT FIRST RAY AMPUTATION   Social History:  reports that she has been smoking Cigarettes.  She has a 15 pack-year smoking history. She has never used smokeless tobacco. She reports that she drinks about 1.2 ounces of alcohol per week. She reports that she does not use illicit drugs.  Allergies  Allergen  Reactions  . Banana     Face puffs up  . Penicillins Itching     edema  . Strawberry     Face puffs up    Family History  Problem Relation Age of Onset  . Pneumonia Mother   . Gallbladder disease Mother     cancer  . Heart failure Mother   . Diabetes Father   . Coronary artery disease Father   . Stroke Neg Hx     Prior to Admission medications   Medication Sig Start Date End Date Taking? Authorizing Provider  clindamycin (CLEOCIN) 300 MG capsule Take 600 mg by mouth 3 (three) times daily. For 7 days Started 8/14   Yes Historical Provider, MD  clopidogrel (PLAVIX) 75 MG tablet Take 1 tablet (75 mg total) by mouth daily with breakfast. 12/17/11 12/16/12 Yes Osvaldo Shipper, MD  docusate sodium (COLACE) 100 MG capsule Take 1 capsule (100 mg total) by mouth 2 (two) times daily. 12/31/11 01/10/12 Yes Renae Fickle, MD  glimepiride (AMARYL) 4 MG tablet Take 4 mg by mouth daily.    Yes Historical Provider, MD  HYDROcodone-acetaminophen (NORCO/VICODIN) 5-325 MG per tablet Take 1 tablet by mouth every 4 (four) hours as needed. For pain   Yes Historical Provider, MD  insulin aspart (NOVOLOG) 100 UNIT/ML injection Inject 6 Units into the skin 3 (three) times daily with meals. 07/03/11 07/02/12 Yes Christina P Rama, MD  insulin glargine (LANTUS) 100 UNIT/ML injection Inject 20 Units into the skin at bedtime. 07/03/11  Yes Christina P Rama, MD  lisinopril (PRINIVIL,ZESTRIL) 20 MG tablet Take 20 mg by mouth daily.   Yes Historical Provider, MD  metFORMIN (GLUCOPHAGE) 500 MG tablet Take 500 mg by mouth 2 (two) times daily with a meal.   Yes Historical Provider, MD  simvastatin (ZOCOR) 40 MG tablet Take 40 mg by mouth every evening.   Yes Historical Provider, MD   Physical Exam: Filed Vitals:   01/07/12 1123 01/07/12 1430 01/07/12 1530  BP: 153/87 153/86 144/79  Pulse: 106 89 87  Temp: 97.8 F (36.6 C)    TempSrc: Oral    Resp: 18 20 20   Height: 5\' 6"  (1.676 m)    Weight: 95.255 kg (210 lb)      SpO2: 100% 100% 100%   BP 144/79  Pulse 87  Temp 97.8 F (36.6 C) (Oral)  Resp 20  Ht 5\' 6"  (1.676 m)  Wt 95.255 kg (210 lb)  BMI 33.89 kg/m2  SpO2 100%  General Appearance:    Alert, cooperative, no distress, appears stated age  Head:    Normocephalic, without obvious abnormality, atraumatic  Eyes:    PERRL, conjunctiva/corneas clear, EOM's intact     Ears:    Normal TM's and external ear canals, both ears  Nose:   Nares normal, septum midline, mucosa normal, no drainage    or sinus tenderness  Throat:   Lips, mucosa, and tongue normal; teeth and gums normal  Neck:   Supple, symmetrical, trachea midline, no adenopathy;    thyroid:  no enlargement/tenderness/nodules; no  carotid   bruit or JVD  Back:     Symmetric, no curvature, ROM normal, no CVA tenderness  Lungs:     Clear to auscultation bilaterally, respirations unlabored  Chest Wall:    No tenderness or deformity   Heart:    Regular rate and rhythm, S1 and S2 normal, no murmur, rub   or gallop     Abdomen:     Soft, non-tender, bowel sounds active all four quadrants,    no masses, no organomegaly        Extremities:   Extremities normal, atraumatic, no cyanosis or edema, Left foot with dressing.   Pulses:   2+ and symmetric all extremities  Skin:   Skin color, texture, turgor normal, no rashes or lesions     Neurologic:   CNII-XII intact, normal strength, sensation and reflexes    throughout    Labs on Admission:  Basic Metabolic Panel:  Lab 01/07/12 0454  NA 139  K 4.0  CL 101  CO2 26  GLUCOSE 178*  BUN 13  CREATININE 0.83  CALCIUM 10.4  MG --  PHOS --   CBC:  Lab 01/07/12 1126  WBC 10.9*  NEUTROABS --  HGB 13.3  HCT 40.5  MCV 84.6  PLT 431*   Cardiac Enzymes: No results found for this basename: CKTOTAL:5,CKMB:5,CKMBINDEX:5,TROPONINI:5 in the last 168 hours BNP: No components found with this basename: POCBNP:5 CBG: No results found for this basename: GLUCAP:5 in the last 168  hours  Radiological Exams on Admission: Dg Chest 2 View  01/07/2012  *RADIOLOGY REPORT*  Clinical Data: Chest pain, dizziness.  CHEST - 2 VIEW  Comparison: 12/15/2011  Findings: Heart and mediastinal contours are within normal limits. No focal opacities or effusions.  No acute bony abnormality.  IMPRESSION: No active cardiopulmonary disease.   Original Report Authenticated By: Cyndie Chime, M.D.     EKG: Sinus tachycardia, PVC.   Assessment/Plan:   1-Chest pain: Pleuritic in nature, EKG with sinus tachycardia, no lower extremities swelling. I will order D dimer if positive will check CT angio rule out PE. 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 I will cycle cardiac enzymes, EKG, Lipid panel. Will start aspirin, continue with statin, nitroglycerin PRN. Repeat ECHO assess EF.   2-HYPERLIPIDEMIA: Continue with statin. Check fasting lipid panel.   3-DM type 2, uncontrolled, with neuropathy: continue with Lantus, glimepiride, novolog for meal coverage.   4-HTN (hypertension): Hold lisinopril, patient will received contrast. PRN hydralazine.  5-Dizziness: Check orthostatic, MRI with prior history stroke and multiple risk factors. Continue with aspirin, plavix. PT, OT consulted.  6-DVT prophylaxis: Lovenox.  7-Left toe S/P amputation: Needs to follow up with ortho as previously arrange.      Time spend: more than 70 minutes.  Family Communication: Discussed plan of care with patient and aunt at bedside.    Hartley Barefoot, MD  [facility] Pager [phone]  If 7PM-7AM, please contact night-coverage [email or web address] Password [email or web address] 01/07/2012, 5:42 PM  D dimer elevated, CT angio still not doen. I will order lovenox per pharmacy to give first dose, in case patient had PE. Will need to follow Ct angio result, if negative will dc lovenox. I will discontinue aspirin.

## 2012-01-08 ENCOUNTER — Encounter (HOSPITAL_COMMUNITY): Payer: Self-pay | Admitting: Radiology

## 2012-01-08 ENCOUNTER — Inpatient Hospital Stay (HOSPITAL_COMMUNITY): Payer: Managed Care, Other (non HMO)

## 2012-01-08 DIAGNOSIS — I517 Cardiomegaly: Secondary | ICD-10-CM

## 2012-01-08 LAB — LIPID PANEL
HDL: 36 mg/dL — ABNORMAL LOW (ref >39)
Triglycerides: 199 mg/dL — ABNORMAL HIGH (ref <150)

## 2012-01-08 LAB — GLUCOSE, CAPILLARY: Glucose-Capillary: 126 mg/dL — ABNORMAL HIGH (ref 70–99)

## 2012-01-08 LAB — CARDIAC PANEL(CRET KIN+CKTOT+MB+TROPI)
Relative Index: INVALID (ref 0.0–2.5)
Troponin I: <0.3 ng/mL (ref <0.30)
Troponin I: <0.3 ng/mL (ref <0.30)

## 2012-01-08 LAB — CBC
MCH: 27.2 pg (ref 26.0–34.0)
MCHC: 32 g/dL (ref 30.0–36.0)
MCV: 84.8 fL (ref 78.0–100.0)
Platelets: 391 10*3/uL (ref 150–400)
RDW: 15.1 % (ref 11.5–15.5)

## 2012-01-08 LAB — COMPREHENSIVE METABOLIC PANEL
AST: 16 U/L (ref 0–37)
Albumin: 3 g/dL — ABNORMAL LOW (ref 3.5–5.2)
Alkaline Phosphatase: 174 U/L — ABNORMAL HIGH (ref 39–117)
BUN: 16 mg/dL (ref 6–23)
Chloride: 103 mEq/L (ref 96–112)
Potassium: 4.1 mEq/L (ref 3.5–5.1)
Total Bilirubin: 0.3 mg/dL (ref 0.3–1.2)

## 2012-01-08 LAB — HEMOGLOBIN A1C: Hgb A1c MFr Bld: 8.7 % — ABNORMAL HIGH (ref <5.7)

## 2012-01-08 LAB — RAPID URINE DRUG SCREEN, HOSP PERFORMED: Barbiturates: POSITIVE — AB

## 2012-01-08 MED ORDER — IOHEXOL 350 MG/ML SOLN
100.0000 mL | Freq: Once | INTRAVENOUS | Status: AC | PRN
  Administered 2012-01-08: 100 mL via INTRAVENOUS

## 2012-01-08 MED ORDER — ENOXAPARIN SODIUM 40 MG/0.4ML ~~LOC~~ SOLN
40.0000 mg | SUBCUTANEOUS | Status: DC
  Filled 2012-01-08: qty 0.4

## 2012-01-08 NOTE — Progress Notes (Signed)
Charted on wrong pt removed

## 2012-01-08 NOTE — Evaluation (Signed)
Clinical/Bedside Swallow Evaluation Patient Details  Name: [name] MRN: [id number] Date of Birth: Nov 12, 1955  Today's Date: 01/08/2012 Time: 1500-1529 SLP Time Calculation (min): 29 min  Past Medical History:  Past Medical History  Diagnosis Date  . Coronary atherosclerosis of native coronary artery     non obstructive by cath in 2008 and 2010  . Unspecified essential hypertension   . Other and unspecified hyperlipidemia   . Syncope and collapse     near-syncopal episode in November/2008  . Unspecified cardiovascular disease     Decreased left-ventricular fxn  . 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 Unspecified hereditary and idiopathic peripheral neuropathy   . Obesity, unspecified   . Esophageal reflux   . History of diabetes mellitus, type II   . Diaphragmatic hernia without mention of obstruction or gangrene   . Irritable bowel syndrome   . Morbid obesity   . Anginal pain   . Diabetes mellitus   . Stroke   . Cellulitis     left foot   Past Surgical History:  Past Surgical History  Procedure Date  . Invasive electrophysiologic study 5/09    followed by insertion of an implantable loop recorder. S/p removal   . Colonoscopy   . Cholecystectomy   . Multiple toe surgeries   . Cardiac catheterization     LAD 30%, circumflex 50%, OM 75%, RI 60% with small branch 80%, dominant RCA 60%, EF 45-50%  . I&d extremity 12/23/2011    Procedure: IRRIGATION AND DEBRIDEMENT EXTREMITY;  Surgeon: Verlee Rossetti, MD;  Location: [facility] OR;  Service: Orthopedics;  Laterality: Left;  Left Foot  . I&d extremity 12/26/2011    Procedure: IRRIGATION AND DEBRIDEMENT EXTREMITY;  Surgeon: Toni Arthurs, MD;  Location: [facility] OR;  Service: Orthopedics;  Laterality: Left;  LEFT FOOT I&D WITH POSSIBLE WOUND VAC APPLICATION, POSSIBLE LEFT FIRST RAY AMPUTATION  . Amputation 12/30/2011    Procedure: AMPUTATION RAY;  Surgeon: Toni Arthurs, MD;  Location: [facility] OR;   Service: Orthopedics;  Laterality: Left;  LEFT FIRST RAY AMPUTATION   HPI:  56 year old with PMH significant for Diabetes, Hypertension, hyperlipidemia, Systolic HF EF 45 % ECHO July 2013, recently discharge from hospital after left toe amputation who presents to ED complaining of chest pain that started the morning of admission. Patient relates that chest pain wake her up. Describes pain as throbbing, middle chest, intermittent, 9/10 initially, associated with dizziness and nausea. Chest pain is pleuritic, worse with deep breat. She denies Cough. No accompanied by SOB. She is chest pain free. She does relates still some dizziness, describes as everything moving around her. She denies focal weakness, slurred speech. She denies chest pain or SOB  on exertion.   MRI with questionable small left cerebellar infarct.  MD ordered a swallow eval. as part of the stroke workup, per RN.   Assessment / Plan / Recommendation Clinical Impression  Demonstrates a functional oral-pharyngeal swallow without any overt clinical indicators of a dysphagia nor aspiration risk.    Aspiration Risk  None    Diet Recommendation Regular;Thin liquid   Liquid Administration via: Cup;Straw Medication Administration: Whole meds with liquid Supervision: Patient able to self feed Compensations: Slow rate;Small sips/bites Postural Changes and/or Swallow Maneuvers: Seated upright 90 degrees    Other  Recommendations Oral Care Recommendations: Oral care QID;Patient independent with oral care Other Recommendations: Clarify dietary restrictions   Follow Up Recommendations  None    Frequency  and Duration        Pertinent Vitals/Pain n/a    SLP Swallow Goals n/a     Swallow Study Prior Functional Status  Type of Home: House Lives With: Alone Available Help at Discharge: Family;Available PRN/intermittently Vocation: Full time employment    General HPI: 56 year old with PMH significant for Diabetes, Hypertension,  hyperlipidemia, Systolic HF EF 45 % ECHO July 2013, recently discharge from hospital after left toe amputation who presents to ED complaining of chest pain that started the morning of admission. Patient relates that chest pain wake her up. Describes pain as throbbing, middle chest, intermittent, 9/10 initially, associated with dizziness and nausea. Chest pain is pleuritic, worse with deep breat. She denies Cough. No accompanied by SOB. She is chest pain free. She does relates still some dizziness, describes as everything moving around her. She denies focal weakness, slurred speech. She denies chest pain or SOB  on exertion.   MRI with questionable small left cerebellar infarct.  MD ordered a swallow eval. as part of the stroke workup, per RN. Type of Study: Bedside swallow evaluation Previous Swallow Assessment: 12/16/11 Bedside Swallow Evaluation was normal. Diet Prior to this Study: Regular;Thin liquids Temperature Spikes Noted: No Respiratory Status: Room air History of Recent Intubation: No Behavior/Cognition: Alert;Cooperative;Pleasant mood Oral Cavity - Dentition: Adequate natural dentition    Oral/Motor/Sensory Function Overall Oral Motor/Sensory Function: Appears within functional limits for tasks assessed   Ice Chips Ice chips: Within functional limits   Thin Liquid Thin Liquid: Within functional limits Presentation: Spoon;Cup;Straw;Self Fed    Nectar Thick Nectar Thick Liquid: Not tested   Honey Thick Honey Thick Liquid: Not tested   Puree Puree: Within functional limits Presentation: Self Fed;Spoon   Solid   GO    Solid: Within functional limits Presentation: Self Daine Gravel, Kori Goins T 01/08/2012,3:37 PM

## 2012-01-08 NOTE — Discharge Summary (Addendum)
Physician Discharge Summary  [name] GEX:[id number] DOB: [birth year] DOA: 01/07/2012  PCP: [name] Blood, MD  Admit date: 01/07/2012 Discharge date: 01/08/2012  Discharge Diagnoses:   Chest pain, atypical.  Dizziness, vertigo, vs autonomic dysfunction.   HYPERLIPIDEMIA  DM type 2, uncontrolled, with neuropathy  HTN (hypertension)   Discharge Condition: Stable.   Disposition:  Follow-up Information    Follow up with [facility], MD in 1 week.   Contact information:   [address] [place] [address] [phone]       Please follow up. (Dr wall office will call you to arrange follow up. )          Diet:Diabetic diet. Wt Readings from Last 3 Encounters:  01/08/12 97.886 kg (215 lb 12.8 oz)  12/23/11 101.787 kg (224 lb 6.4 oz)  12/23/11 101.787 kg (224 lb 6.4 oz)    History of present illness:  56 year old with PMH significant for Diabetes, Hypertension, hyperlipidemia, Systolic HF EF 45 % ECHO July 2013, recently discharge from hospital after left toe amputation who presents to ED complaining of chest pain that started the morning of admission. Patient relates that chest pain wake her up. Describes pain as throbbing, middle chest, intermittent, 9/10 initially, associated with dizziness and nausea. Chest pain is pleuritic, worse with deep breat. She denies Cough. No accompanied by SOB. She is chest pain free. She does relates still some dizziness, describes as everything moving around her. She denies focal weakness, slurred speech. She denies chest pain or SOB on exertion.    Hospital Course:  1-Chest pain: Pleuritic in nature, EKG with sinus tachycardia, no lower extremities swelling. D dimer elevated, CT angio negative for PE. 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 cardiac enzymes times 2 negative,  Lipid panel LDL 83. continue with statin, nitroglycerin PRN. Repeat ECHO assess EF stable.  Patient will need to follow up with primary cardiologist.    2-HYPERLIPIDEMIA: Continue with statin. LDL 83.  3-DM type 2, uncontrolled, with neuropathy: continue with Lantus, glimepiride, novolog for meal coverage.  4-HTN (hypertension): Resume lisinopril.  5-Dizziness:  MRI show Subtle altered signal intensity in the superior left cerebellum on  diffusion sequence. Question artifact versus very small acute infarct? Continue with plavix. PT, OT consulted. Neuro consulted. Neuro doesn't think this was acute stroke, probably artifact. Dizziness autonomic dysfunction vs Inner ear problem. If reoccur she will need referral to othorrino. Patient had Doppler 1 month ago with No significant extracranial carotid artery stenosis demonstrated. Vertebrals are patent with antegrade flow. Dizziness resolved.   6-DVT prophylaxis: Lovenox.  7-Left toe S/P amputation: Needs to follow up with ortho as previously arrange.    Discharge Exam: Filed Vitals:   01/08/12 1317  BP: 156/84  Pulse: 87  Temp: 97.7 F (36.5 C)  Resp: 20   Filed Vitals:   01/08/12 0928 01/08/12 0930 01/08/12 0933 01/08/12 1317  BP: 149/88 128/79 140/87 156/84  Pulse: 94 101 102 87  Temp:    97.7 F (36.5 C)  TempSrc:    Oral  Resp:    20  Height:      Weight:      SpO2:    100%   General: no distress. Cardiovascular: S1, S2 RRR Respiratory: CTA. Neuro: non focal.   Discharge Instructions  Discharge Orders    Future Orders Please Complete By Expires   Diet - low sodium heart healthy      Increase activity slowly  Medication List  As of 01/08/2012  3:52 PM   STOP taking these medications         clindamycin 300 MG capsule         TAKE these medications         clopidogrel 75 MG tablet   Commonly known as: PLAVIX   Take 1 tablet (75 mg total) by mouth daily with breakfast.      docusate sodium 100 MG capsule   Commonly known as: COLACE   Take 1 capsule (100 mg total) by mouth 2 (two) times daily.      glimepiride 4 MG tablet   Commonly known as: AMARYL    Take 4 mg by mouth daily.      HYDROcodone-acetaminophen 5-325 MG per tablet   Commonly known as: NORCO/VICODIN   Take 1 tablet by mouth every 4 (four) hours as needed. For pain      insulin aspart 100 UNIT/ML injection   Commonly known as: novoLOG   Inject 6 Units into the skin 3 (three) times daily with meals.      insulin glargine 100 UNIT/ML injection   Commonly known as: LANTUS   Inject 20 Units into the skin at bedtime.      lisinopril 20 MG tablet   Commonly known as: PRINIVIL,ZESTRIL   Take 20 mg by mouth daily.      metFORMIN 500 MG tablet   Commonly known as: GLUCOPHAGE   Take 500 mg by mouth 2 (two) times daily with a meal.      simvastatin 40 MG tablet   Commonly known as: ZOCOR   Take 40 mg by mouth every evening.              The results of significant diagnostics from this hospitalization (including imaging, microbiology, ancillary and laboratory) are listed below for reference.    Significant Diagnostic Studies: Dg Chest 2 View  01/07/2012  *RADIOLOGY REPORT*  Clinical Data: Chest pain, dizziness.  CHEST - 2 VIEW  Comparison: 12/15/2011  Findings: Heart and mediastinal contours are within normal limits. No focal opacities or effusions.  No acute bony abnormality.  IMPRESSION: No active cardiopulmonary disease.   Original Report Authenticated By: Cyndie Chime, M.D.    Dg Chest 2 View  12/15/2011  *RADIOLOGY REPORT*  Clinical Data: Dizziness  CHEST - 2 VIEW  Comparison: 11/26/2011  Findings: Normal heart size.  Under aerated and grossly clear lungs.  No pneumothorax.  No pleural effusions.  Stable thoracic spine.  IMPRESSION: No active cardiopulmonary disease.  Original Report Authenticated By: Donavan Burnet, M.D.   Ct Head Wo Contrast  12/23/2011  *RADIOLOGY REPORT*  Clinical Data: Right frontal headaches  CT HEAD WITHOUT CONTRAST  Technique:  Contiguous axial images were obtained from the base of the skull through the vertex without contrast.   Comparison: July 01, 2011  Findings: The ventricles are normal in size, shape, and position. There is no mass effect or midline shift.  No acute hemorrhage or abnormal extra-axial fluid collections are identified.  The gray/white differentiation is normal.  The orbits, calvarium, visualized paranasal sinuses have a normal appearance.  IMPRESSION: Normal CT scan of the head with no evidence of acute intracranial abnormality.  Original Report Authenticated By: Brandon Melnick, M.D.   Ct Head Wo Contrast  12/15/2011  *RADIOLOGY REPORT*  Clinical Data: Dizziness.  Nausea.  Headache.  Left arm numbness.  CT HEAD WITHOUT CONTRAST  Technique:  Contiguous axial images were obtained from  the base of the skull through the vertex without contrast.  Comparison: 07/02/2011  Findings: The brain shows generalized atrophy.  No sign of old or acute focal infarction, mass lesion, hemorrhage, hydrocephalus or extra-axial collection.  The calvarium is unremarkable.  Sinuses, middle ears and mastoids are clear.  IMPRESSION: Negative head CT.  Mild generalized atrophy.  No focal or acute finding.  Original Report Authenticated By: Thomasenia Sales, M.D.   Ct Angio Chest Pe W/cm &/or Wo Cm  01/08/2012  *RADIOLOGY REPORT*  Clinical Data: Chest pain.  Increased D-dimer.  CT ANGIOGRAPHY CHEST  Technique:  Multidetector CT imaging of the chest using the standard protocol during bolus administration of intravenous contrast. Multiplanar reconstructed images including MIPs were obtained and reviewed to evaluate the vascular anatomy.  Contrast: OMNIPAQUE IOHEXOL 350 MG/ML SOLN  Comparison: 11/12/2007  Findings: Technically adequate study with good opacification of the central and segmental pulmonary arteries.  No focal filling defects are demonstrated.  No evidence of significant pulmonary embolus.  Normal heart size.  Normal caliber thoracic aorta.  Calcification of coronary arteries and aorta.  No significant lymphadenopathy in the  chest.  The esophagus is mostly decompressed.  Mild enlargement of the thyroid gland.  No pleural effusions.  Visualization of lung fields is limited by respiratory motion artifact but there is no gross airspace consolidation or significant edema pattern.  No pneumothorax. Airways appear patent.  Degenerative changes in the thoracic spine.  IMPRESSION: No evidence of significant pulmonary embolus.   Original Report Authenticated By: Marlon Pel, M.D.    Mri Brain Without Contrast  12/16/2011  *RADIOLOGY REPORT*  Clinical Data:  Left sided numbness.  Facial droop  MRI HEAD WITHOUT CONTRAST MRA HEAD WITHOUT CONTRAST  Technique:  Multiplanar, multiecho pulse sequences of the brain and surrounding structures were obtained without intravenous contrast. Angiographic images of the head were obtained using MRA technique without contrast.  Comparison:  CT 12/15/2011  MRI HEAD  Findings:  Sub centimeter acute infarct in the right frontal white matter.  No other areas of acute infarct.  Mild chronic microvascular ischemic change in the white matter. Ventricle size is normal.  Negative for hemorrhage or mass. Paranasal sinuses are clear.  IMPRESSION: Acute infarct in the right frontal white matter adjacent to the frontal horn of the lateral ventricle.  MRA HEAD  Findings: Left vertebral artery is dominant.  Small right vertebral artery with a moderate stenosis distally.  Mild irregularity the basilar due to atherosclerotic disease.  PICA is patent bilaterally.  Superior cerebellar and posterior cerebral arteries are patent.  Fetal origin of the posterior cerebral artery bilaterally.  Basilar artery ends in the superior cerebellar arteries.  Internal carotid artery is patent bilaterally with mild atherosclerotic disease in the cavernous segment.  Anterior and middle cerebral arteries are patent bilaterally.  Mild irregularity of the middle cerebral artery branches bilaterally suggesting atherosclerotic disease.   Negative for cerebral aneurysm.  IMPRESSION: Mild to moderate intracranial atherosclerotic disease without large vessel occlusion.  Original Report Authenticated By: Camelia Phenes, M.D.   Mr Laqueta Jean WU Contrast  01/08/2012  *RADIOLOGY REPORT*  Clinical Data: Dizziness.  Nausea.  History of stroke.  Diabetic.  MRI HEAD WITHOUT AND WITH CONTRAST  Technique:  Multiplanar, multiecho pulse sequences of the brain and surrounding structures were obtained according to standard protocol without and with intravenous contrast  Contrast: 20mL MULTIHANCE GADOBENATE DIMEGLUMINE 529 MG/ML IV SOLN  Comparison: 12/23/2011 CT.  12/15/2011 MR.  Findings: Subtle altered  signal intensity in the superior left cerebellum on diffusion sequence. Question artifact versus very small acute infarct?  Scattered small remote infarcts within the hemisphere bilaterally, thalamus bilaterally and basal ganglia bilaterally.  Small vessel disease type changes.  No intracranial hemorrhage.  Global atrophy without hydrocephalus.  No intracranial mass or abnormal enhancement.  Atherosclerotic type changes vertebral arteries and basilar artery which appears small and attenuated.  Partially empty sella.  This may be an incidental finding although also described in patients with pseudotumor cerebri.  Exophthalmos.  Cervical spondylotic changes with spinal stenosis and slight cord flattening C4-5 level.  IMPRESSION:  Subtle altered signal intensity in the superior left cerebellum on diffusion sequence. Question artifact versus very small acute infarct?  Please see above.   Original Report Authenticated By: Fuller Canada, M.D.    Dg Foot Complete Left  12/23/2011  *RADIOLOGY REPORT*  Clinical Data: Ulcer left foot.  LEFT FOOT - COMPLETE 3+ VIEW  Comparison: 09/04/2011.  Findings: There is soft tissue gas at the base of the left great toe and extending between the first and second metatarsals compatible with infection.  No radiographic changes of  osteomyelitis at this time.  Old deformity and destruction of the left fifth phalanges, stable.  IMPRESSION: Extensive soft tissue gas in the base of the left great toe and extending between the first and second metatarsals compatible with soft tissue infection.  No radiographic changes of acute osteomyelitis.  Original Report Authenticated By: Cyndie Chime, M.D.   Mr Mra Head/brain Wo Cm  12/16/2011  *RADIOLOGY REPORT*  Clinical Data:  Left sided numbness.  Facial droop  MRI HEAD WITHOUT CONTRAST MRA HEAD WITHOUT CONTRAST  Technique:  Multiplanar, multiecho pulse sequences of the brain and surrounding structures were obtained without intravenous contrast. Angiographic images of the head were obtained using MRA technique without contrast.  Comparison:  CT 12/15/2011  MRI HEAD  Findings:  Sub centimeter acute infarct in the right frontal white matter.  No other areas of acute infarct.  Mild chronic microvascular ischemic change in the white matter. Ventricle size is normal.  Negative for hemorrhage or mass. Paranasal sinuses are clear.  IMPRESSION: Acute infarct in the right frontal white matter adjacent to the frontal horn of the lateral ventricle.  MRA HEAD  Findings: Left vertebral artery is dominant.  Small right vertebral artery with a moderate stenosis distally.  Mild irregularity the basilar due to atherosclerotic disease.  PICA is patent bilaterally.  Superior cerebellar and posterior cerebral arteries are patent.  Fetal origin of the posterior cerebral artery bilaterally.  Basilar artery ends in the superior cerebellar arteries.  Internal carotid artery is patent bilaterally with mild atherosclerotic disease in the cavernous segment.  Anterior and middle cerebral arteries are patent bilaterally.  Mild irregularity of the middle cerebral artery branches bilaterally suggesting atherosclerotic disease.  Negative for cerebral aneurysm.  IMPRESSION: Mild to moderate intracranial atherosclerotic disease  without large vessel occlusion.  Original Report Authenticated By: Camelia Phenes, M.D.    Microbiology: No results found for this or any previous visit (from the past 240 hour(s)).   Labs: Basic Metabolic Panel:  Lab 01/08/12 1610 01/07/12 1759 01/07/12 1126  NA 139 -- 139  K 4.1 -- 4.0  CL 103 -- 101  CO2 24 -- 26  GLUCOSE 117* -- 178*  BUN 16 -- 13  CREATININE 0.89 0.82 0.83  CALCIUM 9.7 -- 10.4  MG -- -- --  PHOS -- -- --   Liver Function Tests:  Lab 01/08/12 0602  AST 16  ALT 20  ALKPHOS 174*  BILITOT 0.3  PROT 7.8  ALBUMIN 3.0*   CBC:  Lab 01/08/12 0602 01/07/12 1759 01/07/12 1126  WBC 9.8 10.3 10.9*  NEUTROABS -- -- --  HGB 12.3 12.4 13.3  HCT 38.4 38.5 40.5  MCV 84.8 84.4 84.6  PLT 391 413* 431*   Cardiac Enzymes:  Lab 01/08/12 1103 01/08/12 0130 01/07/12 1759  CKTOTAL 35 60 32  CKMB 1.2 1.1 1.2  CKMBINDEX -- -- --  TROPONINI <0.30 <0.30 <0.30   BNP: No components found with this basename: POCBNP:5 CBG:  Lab 01/08/12 1250 01/08/12 0555 01/07/12 2308  GLUCAP 172* 124* 210*    Time coordinating discharge: 35 minutes  Signed:  REGALADO,BELKYS  [facility] 01/08/2012, 3:52 PM

## 2012-01-08 NOTE — Progress Notes (Deleted)
1530 report given to Fleet Contras , nurse from [facility] d/c transfer forms given to daughter to give facility  1540 discharge instructions and appoinments given to daughter .and pt verbalized understanding  1550  Pt escorted to lobby by NT

## 2012-01-08 NOTE — Progress Notes (Signed)
Subjective: Patient denies chest pain.  Dizziness on and off, better. Had an episode of dizziness month ago.  Objective: Filed Vitals:   01/07/12 2007 01/07/12 2010 01/08/12 0306 01/08/12 0700  BP: 125/83 140/84 146/77 155/87  Pulse: 114 119 90 82  Temp:   98.1 F (36.7 C) 97.6 F (36.4 C)  TempSrc:   Oral Oral  Resp: 18 18 20 18   Height:      Weight:    97.886 kg (215 lb 12.8 oz)  SpO2: 100% 100% 99% 100%   Weight change:    General: Alert, awake, oriented x3, in no acute distress.  HEENT: No bruits, no goiter.  Heart: Regular rate and rhythm, without murmurs, rubs, gallops.  Lungs: CTA, bilateral air movement.  Abdomen: Soft, nontender, nondistended, positive bowel sounds.  Neuro: Grossly intact, nonfocal. Extremities: no edema. Left foot with dressing.   Lab Results:  Basename 01/08/12 0602 01/07/12 1759 01/07/12 1126  NA 139 -- 139  K 4.1 -- 4.0  CL 103 -- 101  CO2 24 -- 26  GLUCOSE 117* -- 178*  BUN 16 -- 13  CREATININE 0.89 0.82 --  CALCIUM 9.7 -- 10.4  MG -- -- --  PHOS -- -- --    Basename 01/08/12 0602  AST 16  ALT 20  ALKPHOS 174*  BILITOT 0.3  PROT 7.8  ALBUMIN 3.0*    Basename 01/08/12 0602 01/07/12 1759  WBC 9.8 10.3  NEUTROABS -- --  HGB 12.3 12.4  HCT 38.4 38.5  MCV 84.8 84.4  PLT 391 413*    Basename 01/08/12 0130 01/07/12 1759  CKTOTAL 60 32  CKMB 1.1 1.2  CKMBINDEX -- --  TROPONINI <0.30 <0.30    Basename 01/07/12 1759  DDIMER 1.01*    Basename 01/07/12 1759  HGBA1C 8.7*    Basename 01/08/12 0602  CHOL 159  HDL 36*  LDLCALC 83  TRIG 119*  CHOLHDL 4.4  LDLDIRECT --    Basename 01/07/12 1759  TSH 0.573  T4TOTAL --  T3FREE --  THYROIDAB --   No results found for this basename: VITAMINB12:2,FOLATE:2,FERRITIN:2,TIBC:2,IRON:2,RETICCTPCT:2 in the last 72 hours  Micro Results: No results found for this or any previous visit (from the past 240 hour(s)).  Studies/Results: Dg Chest 2 View  01/07/2012  *RADIOLOGY  REPORT*  Clinical Data: Chest pain, dizziness.  CHEST - 2 VIEW  Comparison: 12/15/2011  Findings: Heart and mediastinal contours are within normal limits. No focal opacities or effusions.  No acute bony abnormality.  IMPRESSION: No active cardiopulmonary disease.   Original Report Authenticated By: Cyndie Chime, M.D.    Ct Angio Chest Pe W/cm &/or Wo Cm  01/08/2012  *RADIOLOGY REPORT*  Clinical Data: Chest pain.  Increased D-dimer.  CT ANGIOGRAPHY CHEST  Technique:  Multidetector CT imaging of the chest using the standard protocol during bolus administration of intravenous contrast. Multiplanar reconstructed images including MIPs were obtained and reviewed to evaluate the vascular anatomy.  Contrast: OMNIPAQUE IOHEXOL 350 MG/ML SOLN  Comparison: 11/12/2007  Findings: Technically adequate study with good opacification of the central and segmental pulmonary arteries.  No focal filling defects are demonstrated.  No evidence of significant pulmonary embolus.  Normal heart size.  Normal caliber thoracic aorta.  Calcification of coronary arteries and aorta.  No significant lymphadenopathy in the chest.  The esophagus is mostly decompressed.  Mild enlargement of the thyroid gland.  No pleural effusions.  Visualization of lung fields is limited by respiratory motion artifact but there is no  gross airspace consolidation or significant edema pattern.  No pneumothorax. Airways appear patent.  Degenerative changes in the thoracic spine.  IMPRESSION: No evidence of significant pulmonary embolus.   Original Report Authenticated By: Marlon Pel, M.D.    Mr Laqueta Jean ZO Contrast  01/08/2012  *RADIOLOGY REPORT*  Clinical Data: Dizziness.  Nausea.  History of stroke.  Diabetic.  MRI HEAD WITHOUT AND WITH CONTRAST  Technique:  Multiplanar, multiecho pulse sequences of the brain and surrounding structures were obtained according to standard protocol without and with intravenous contrast  Contrast: 20mL MULTIHANCE  GADOBENATE DIMEGLUMINE 529 MG/ML IV SOLN  Comparison: 12/23/2011 CT.  12/15/2011 MR.  Findings: Subtle altered signal intensity in the superior left cerebellum on diffusion sequence. Question artifact versus very small acute infarct?  Scattered small remote infarcts within the hemisphere bilaterally, thalamus bilaterally and basal ganglia bilaterally.  Small vessel disease type changes.  No intracranial hemorrhage.  Global atrophy without hydrocephalus.  No intracranial mass or abnormal enhancement.  Atherosclerotic type changes vertebral arteries and basilar artery which appears small and attenuated.  Partially empty sella.  This may be an incidental finding although also described in patients with pseudotumor cerebri.  Exophthalmos.  Cervical spondylotic changes with spinal stenosis and slight cord flattening C4-5 level.  IMPRESSION:  Subtle altered signal intensity in the superior left cerebellum on diffusion sequence. Question artifact versus very small acute infarct?  Please see above.   Original Report Authenticated By: Fuller Canada, M.D.     Medications: I have reviewed the patient's current medications.  1-Chest pain: Pleuritic in nature, EKG with sinus tachycardia, no lower extremities swelling. D dimer elevated, CT angio negative for PE. 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 cardiac enzymes times 2 negative, EKG, Lipid panel LDL 83. continue with statin, nitroglycerin PRN. Repeat ECHO assess EF.  If ECHO stable will need to follow up with her cardiologist. I discontinue therapeutic dose of Lovenox.   2-HYPERLIPIDEMIA: Continue with statin. LDL 83. 3-DM type 2, uncontrolled, with neuropathy: continue with Lantus, glimepiride, novolog for meal coverage.  4-HTN (hypertension): Hold lisinopril, patient will received contrast. PRN hydralazine.  5-Dizziness: Repeat  orthostatic, MRI show  Subtle altered signal intensity in the superior left cerebellum on  diffusion sequence. Question artifact  versus very small acute infarct?  Continue with  plavix. PT, OT consulted. Neuro consulted. Patient had Doppler 1 month ago with No significant extracranial carotid artery stenosis demonstrated. Vertebrals are patent with antegrade flow.  6-DVT prophylaxis: Lovenox.  7-Left toe S/P amputation: Needs to follow up with ortho as previously arrange.      LOS: 1 day   Sasuke Yaffe M.D.  [facility] 01/08/2012, 8:26 AM

## 2012-01-08 NOTE — Progress Notes (Signed)
Occupational Therapy Evaluation Patient Details Name: [name] MRN: [id number] DOB: 25-Sep-1955 Today's Date: 01/08/2012 Time: 1191-4782 OT Time Calculation (min): 16 min  OT Assessment / Plan / Recommendation Clinical Impression  Pt with with past history of DM 2, hypertension, recent TIA who comes in the hospital with complaint of chest pain- CT angiogram negative for PE, cardiac enzymes negative so far.  Pt admitted with dizziness which at this time she reports has resolved.  Pt has returned to baseline with ADLs and functional mobility and has no acute OT needs at this time.    OT Assessment  Patient does not need any further OT services    Follow Up Recommendations  Supervision - Intermittent;No OT follow up    Barriers to Discharge      Equipment Recommendations  None recommended by OT    Recommendations for Other Services    Frequency       Precautions / Restrictions Restrictions Weight Bearing Restrictions: No Other Position/Activity Restrictions: Darco shoe LLE   Pertinent Vitals/Pain See vitals     ADL  Lower Body Dressing: Performed;Independent Where Assessed - Lower Body Dressing: Unsupported sit to stand Toilet Transfer: Performed;Supervision/safety Toilet Transfer Method: Sit to Barista:  (bed) Equipment Used: Gait belt (straight cane, darco shoe) Transfers/Ambulation Related to ADLs: Pt ambulated throughout room with supervision and straight cane. ADL Comments: Pt at baseline level with BADLs.  Pt reports that she no longer has dizziness but is just sleep from laying in bed today.  Discussed with pt use of 3n1 as tub seat rather than standing in shower.  Pt reports that her nephew can assist as much as she needs him to.    OT Diagnosis:    OT Problem List:   OT Treatment Interventions:     OT Goals    Visit Information  Last OT Received On: 01/08/12    Subjective Data      Prior Functioning  Vision/Perception  Home  Living Lives With: Alone Available Help at Discharge: Family;Available PRN/intermittently Type of Home: House Home Access: Stairs to enter [employer] of Steps: 1 Home Layout: One level Bathroom Shower/Tub: Forensic scientist: Standard Home Adaptive Equipment: Walker - rolling;Straight cane;Bedside commode/3-in-1 Prior Function Level of Independence: Independent with assistive device(s) Able to Take Stairs?: Yes Driving: Yes Vocation: Full time employment Comments: insurance Communication Communication: No difficulties Dominant Hand: Right      Cognition  Overall Cognitive Status: Appears within functional limits for tasks assessed/performed Arousal/Alertness: Awake/alert Orientation Level: Appears intact for tasks assessed Behavior During Session: [facility] for tasks performed    Extremity/Trunk Assessment Right Upper Extremity Assessment RUE ROM/Strength/Tone: Within functional levels Left Upper Extremity Assessment LUE ROM/Strength/Tone: Within functional levels   Mobility Bed Mobility Bed Mobility: Supine to Sit;Sit to Supine;Sitting - Scoot to Edge of Bed Supine to Sit: 7: Independent Sitting - Scoot to Edge of Bed: 7: Independent Sit to Supine: 7: Independent Transfers Transfers: Stand to Sit;Sit to Stand Sit to Stand: 6: Modified independent (Device/Increase time);From bed;With upper extremity assist Stand to Sit: 7: Independent;To bed;With upper extremity assist   Exercise    Balance    End of Session OT - End of Session Equipment Utilized During Treatment: Gait belt Activity Tolerance: Patient tolerated treatment well Patient left: in bed;with call bell/phone within reach Nurse Communication: Mobility status  GO    01/08/2012 Cipriano Mile OTR/L Pager [phone] Office [phone]  Cipriano Mile 01/08/2012, 3:36 PM

## 2012-01-08 NOTE — Progress Notes (Signed)
  Echocardiogram 2D Echocardiogram has been performed.  [name] 01/08/2012, 10:37 AM

## 2012-01-08 NOTE — ED Provider Notes (Signed)
I saw and evaluated the patient, reviewed the resident's note and I agree with the findings and plan.   .Face to face Exam:  General:  Awake HEENT:  Atraumatic Resp:  Normal effort Abd:  Nondistended Neuro:No focal weakness Lymph: No adenopathy   Shakea Isip L Dmitry Macomber, MD 01/08/12 2328 

## 2012-01-08 NOTE — Progress Notes (Signed)
Inpatient Diabetes Program Recommendations  AACE/ADA: New Consensus Statement on Inpatient Glycemic Control (2013)  Target Ranges:  Prepandial:   less than 140 mg/dL      Peak postprandial:   less than 180 mg/dL (1-2 hours)      Critically ill patients:  140 - 180 mg/dL   Reason for Visit: Incomplete diet orders  Inpatient Diabetes Program Recommendations Correction (SSI): xxxxxxx Diet: I added carbohydrate modified to present diet orders.  Note: Thank you, Lenor Coffin, RN, CNS, Diabetes Coordinator [phone])

## 2012-01-08 NOTE — Progress Notes (Signed)
Utilization Review Completed.  

## 2012-01-08 NOTE — Progress Notes (Signed)
PT Cancellation Note  Treatment cancelled today due to patient receiving procedure or test.  [name],[name] 01/08/2012, 11:21 AM  [facility] [phone] (pager)

## 2012-01-08 NOTE — Consult Note (Signed)
[facility] CONSULT NOTE     Reason for Consult: Dizziness and possible CVA CC: Dizziness   HPI:    [name] is an 56 y.o. female with past history of DM 2, hypertension, recent TIA who comes in the hospital with complaint of chest pain- CT angiogram negative for PE, cardiac enzymes negative so far. She complained of dizziness- which is going on for about 2 months now. Intermittent, once every week. Occurs when she gets out of bed- is associated with room spinning sensation. Does not have any associated nausea, vomiting, weakness, numbness, tingling. She has associated mild right frontal headache after dizziness- which lasts for about couple of hours she has history of migraine when she was in 20s- but no headaches anymore except associated with dizziness. She denies any visual changes with this dizziness spells. Also has no change in gait or balance. Denies any sinus congestion, postnasal drip, fever, chills, chest pain, short of breath.    Past Medical History  Diagnosis Date  . Coronary atherosclerosis of native coronary artery     non obstructive by cath in 2008 and 2010  . Unspecified essential hypertension   . Other and unspecified hyperlipidemia   . Syncope and collapse     near-syncopal episode in November/2008  . Unspecified cardiovascular disease     Decreased left-ventricular fxn  . 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 Unspecified hereditary and idiopathic peripheral neuropathy   . Obesity, unspecified   . Esophageal reflux   . History of diabetes mellitus, type II   . Diaphragmatic hernia without mention of obstruction or gangrene   . Irritable bowel syndrome   . Morbid obesity   . Anginal pain   . Diabetes mellitus   . Stroke   . Cellulitis     left foot    Past Surgical History  Procedure Date  . Invasive electrophysiologic study 5/09    followed by insertion of an  implantable loop recorder. S/p removal   . Colonoscopy   . Cholecystectomy   . Multiple toe surgeries   . Cardiac catheterization     LAD 30%, circumflex 50%, OM 75%, RI 60% with small branch 80%, dominant RCA 60%, EF 45-50%  . I&d extremity 12/23/2011    Procedure: IRRIGATION AND DEBRIDEMENT EXTREMITY;  Surgeon: Verlee Rossetti, MD;  Location: [facility] OR;  Service: Orthopedics;  Laterality: Left;  Left Foot  . I&d extremity 12/26/2011    Procedure: IRRIGATION AND DEBRIDEMENT EXTREMITY;  Surgeon: Toni Arthurs, MD;  Location: [facility] OR;  Service: Orthopedics;  Laterality: Left;  LEFT FOOT I&D WITH POSSIBLE WOUND VAC APPLICATION, POSSIBLE LEFT FIRST RAY AMPUTATION  . Amputation 12/30/2011    Procedure: AMPUTATION RAY;  Surgeon: Toni Arthurs, MD;  Location: [facility] OR;  Service: Orthopedics;  Laterality: Left;  LEFT FIRST RAY AMPUTATION    Family History  Problem Relation Age of Onset  . Pneumonia Mother   . Gallbladder disease Mother     cancer  . Heart failure Mother   . Diabetes Father   . Coronary artery disease Father   . Stroke Neg Hx     Social History:  reports that she has been smoking Cigarettes.  She has a 7.5 pack-year smoking history. She has never used smokeless tobacco. She reports that she drinks about 1.2 ounces of alcohol  per week. She reports that she does not use illicit drugs.  Allergies  Allergen Reactions  . Banana     Face puffs up  . Penicillins Itching     edema  . Strawberry     Face puffs up    Medications:    I have reviewed the patient's current medications.  Review of Systems - General ROS: negative for - chills, fatigue, fever or hot flashes Hematological and Lymphatic ROS: negative for - bruising, fatigue, jaundice or pallor Endocrine ROS: negative for - hair pattern changes, hot flashes, mood swings or skin changes Respiratory ROS: negative for - cough, hemoptysis, orthopnea or wheezing Cardiovascular ROS: negative for - dyspnea on exertion, orthopnea,  palpitations or shortness of breath Gastrointestinal ROS: negative for - abdominal pain, appetite loss, blood in stools, diarrhea or hematemesis Musculoskeletal ROS: negative for - joint pain, joint stiffness, joint swelling or muscle pain Neurological ROS: positive for - dizziness Dermatological ROS: negative for dry skin, pruritus and rash   Blood pressure 140/87, pulse 102, temperature 97.6 F (36.4 C), temperature source Oral, resp. rate 16, height 5\' 6"  (1.676 m), weight 215 lb 12.8 oz (97.886 kg), SpO2 100.00%.   Neurologic Examination:   Mental Status: Alert, oriented, thought content appropriate.  Speech fluent without evidence of aphasia.  Able to follow 3 step commands without difficulty. Cranial Nerves: II- XII normal  Motor: 5/5 - Right  And Left Upper and Lower extremeties Tone and bulk:normal tone throughout; no atrophy noted Sensory: light touch intact throughout, bilaterally Deep Tendon Reflexes:  2+ B/L Plantars: Right: downgoing   Left: downgoing Cerebellar: normal finger-to-nose, normal rapid alternating movements and normal heel-to-shin test normal gait and station  Lab Results  Component Value Date/Time   CHOL 159 01/08/2012  6:02 AM    Results for orders placed during the hospital encounter of 01/07/12 (from the past 48 hour(s))  CBC     Status: Abnormal   Collection Time   01/07/12 11:26 AM      Component Value Range Comment   WBC 10.9 (*) 4.0 - 10.5 K/uL    RBC 4.79  3.87 - 5.11 MIL/uL    Hemoglobin 13.3  12.0 - 15.0 g/dL    HCT 04.5  40.9 - 81.1 %    MCV 84.6  78.0 - 100.0 fL    MCH 27.8  26.0 - 34.0 pg    MCHC 32.8  30.0 - 36.0 g/dL    RDW 91.4  78.2 - 95.6 %    Platelets 431 (*) 150 - 400 K/uL   BASIC METABOLIC PANEL     Status: Abnormal   Collection Time   01/07/12 11:26 AM      Component Value Range Comment   Sodium 139  135 - 145 mEq/L    Potassium 4.0  3.5 - 5.1 mEq/L    Chloride 101  96 - 112 mEq/L    CO2 26  19 - 32 mEq/L     Glucose, Bld 178 (*) 70 - 99 mg/dL    BUN 13  6 - 23 mg/dL    Creatinine, Ser 2.13  0.50 - 1.10 mg/dL    Calcium 08.6  8.4 - 10.5 mg/dL    GFR calc non Af Amer 77 (*) >90 mL/min    GFR calc Af Amer 89 (*) >90 mL/min   PRO B NATRIURETIC PEPTIDE     Status: Abnormal   Collection Time   01/07/12 11:26 AM      Component  Value Range Comment   Pro B Natriuretic peptide (BNP) 750.5 (*) 0 - 125 pg/mL   POCT I-STAT TROPONIN I     Status: Normal   Collection Time   01/07/12 12:37 PM      Component Value Range Comment   Troponin i, poc 0.01  0.00 - 0.08 ng/mL    Comment 3            CARDIAC PANEL(CRET KIN+CKTOT+MB+TROPI)     Status: Normal   Collection Time   01/07/12  5:59 PM      Component Value Range Comment   Total CK 32  7 - 177 U/L    CK, MB 1.2  0.3 - 4.0 ng/mL    Troponin I <0.30  <0.30 ng/mL    Relative Index RELATIVE INDEX IS INVALID  0.0 - 2.5   D-DIMER, QUANTITATIVE     Status: Abnormal   Collection Time   01/07/12  5:59 PM      Component Value Range Comment   D-Dimer, Quant 1.01 (*) 0.00 - 0.48 ug/mL-FEU   TSH     Status: Normal   Collection Time   01/07/12  5:59 PM      Component Value Range Comment   TSH 0.573  0.350 - 4.500 uIU/mL   HEMOGLOBIN A1C     Status: Abnormal   Collection Time   01/07/12  5:59 PM      Component Value Range Comment   Hemoglobin A1C 8.7 (*) <5.7 %    Mean Plasma Glucose 203 (*) <117 mg/dL   CBC     Status: Abnormal   Collection Time   01/07/12  5:59 PM      Component Value Range Comment   WBC 10.3  4.0 - 10.5 K/uL    RBC 4.56  3.87 - 5.11 MIL/uL    Hemoglobin 12.4  12.0 - 15.0 g/dL    HCT 95.6  21.3 - 08.6 %    MCV 84.4  78.0 - 100.0 fL    MCH 27.2  26.0 - 34.0 pg    MCHC 32.2  30.0 - 36.0 g/dL    RDW 57.8  46.9 - 62.9 %    Platelets 413 (*) 150 - 400 K/uL   CREATININE, SERUM     Status: Abnormal   Collection Time   01/07/12  5:59 PM      Component Value Range Comment   Creatinine, Ser 0.82  0.50 - 1.10 mg/dL    GFR calc non Af Amer 78  (*) >90 mL/min    GFR calc Af Amer >90  >90 mL/min   GLUCOSE, CAPILLARY     Status: Abnormal   Collection Time   01/07/12 11:08 PM      Component Value Range Comment   Glucose-Capillary 210 (*) 70 - 99 mg/dL   CARDIAC PANEL(CRET KIN+CKTOT+MB+TROPI)     Status: Normal   Collection Time   01/08/12  1:30 AM      Component Value Range Comment   Total CK 60  7 - 177 U/L HEMOLYSIS AT THIS LEVEL MAY AFFECT RESULT   CK, MB 1.1  0.3 - 4.0 ng/mL    Troponin I <0.30  <0.30 ng/mL    Relative Index RELATIVE INDEX IS INVALID  0.0 - 2.5   GLUCOSE, CAPILLARY     Status: Abnormal   Collection Time   01/08/12  5:55 AM      Component Value Range Comment   Glucose-Capillary 124 (*) 70 - 99  mg/dL   COMPREHENSIVE METABOLIC PANEL     Status: Abnormal   Collection Time   01/08/12  6:02 AM      Component Value Range Comment   Sodium 139  135 - 145 mEq/L    Potassium 4.1  3.5 - 5.1 mEq/L    Chloride 103  96 - 112 mEq/L    CO2 24  19 - 32 mEq/L    Glucose, Bld 117 (*) 70 - 99 mg/dL    BUN 16  6 - 23 mg/dL    Creatinine, Ser 1.61  0.50 - 1.10 mg/dL    Calcium 9.7  8.4 - 09.6 mg/dL    Total Protein 7.8  6.0 - 8.3 g/dL    Albumin 3.0 (*) 3.5 - 5.2 g/dL    AST 16  0 - 37 U/L    ALT 20  0 - 35 U/L    Alkaline Phosphatase 174 (*) 39 - 117 U/L    Total Bilirubin 0.3  0.3 - 1.2 mg/dL    GFR calc non Af Amer 71 (*) >90 mL/min    GFR calc Af Amer 82 (*) >90 mL/min   CBC     Status: Normal   Collection Time   01/08/12  6:02 AM      Component Value Range Comment   WBC 9.8  4.0 - 10.5 K/uL    RBC 4.53  3.87 - 5.11 MIL/uL    Hemoglobin 12.3  12.0 - 15.0 g/dL    HCT 04.5  40.9 - 81.1 %    MCV 84.8  78.0 - 100.0 fL    MCH 27.2  26.0 - 34.0 pg    MCHC 32.0  30.0 - 36.0 g/dL    RDW 91.4  78.2 - 95.6 %    Platelets 391  150 - 400 K/uL   LIPID PANEL     Status: Abnormal   Collection Time   01/08/12  6:02 AM      Component Value Range Comment   Cholesterol 159  0 - 200 mg/dL    Triglycerides 213 (*) <150 mg/dL     HDL 36 (*) >08 mg/dL    Total CHOL/HDL Ratio 4.4      VLDL 40  0 - 40 mg/dL    LDL Cholesterol 83  0 - 99 mg/dL   URINE RAPID DRUG SCREEN (HOSP PERFORMED)     Status: Abnormal   Collection Time   01/08/12  6:15 AM      Component Value Range Comment   Opiates NONE DETECTED  NONE DETECTED    Cocaine NONE DETECTED  NONE DETECTED    Benzodiazepines NONE DETECTED  NONE DETECTED    Amphetamines NONE DETECTED  NONE DETECTED    Tetrahydrocannabinol NONE DETECTED  NONE DETECTED    Barbiturates POSITIVE (*) NONE DETECTED     Dg Chest 2 View  01/07/2012  *RADIOLOGY REPORT*  Clinical Data: Chest pain, dizziness.  CHEST - 2 VIEW  Comparison: 12/15/2011  Findings: Heart and mediastinal contours are within normal limits. No focal opacities or effusions.  No acute bony abnormality.  IMPRESSION: No active cardiopulmonary disease.   Original Report Authenticated By: Cyndie Chime, M.D.    Ct Angio Chest Pe W/cm &/or Wo Cm  01/08/2012  *RADIOLOGY REPORT*  Clinical Data: Chest pain.  Increased D-dimer.  CT ANGIOGRAPHY CHEST  Technique:  Multidetector CT imaging of the chest using the standard protocol during bolus administration of intravenous contrast. Multiplanar reconstructed images including MIPs  were obtained and reviewed to evaluate the vascular anatomy.  Contrast: OMNIPAQUE IOHEXOL 350 MG/ML SOLN  Comparison: 11/12/2007  Findings: Technically adequate study with good opacification of the central and segmental pulmonary arteries.  No focal filling defects are demonstrated.  No evidence of significant pulmonary embolus.  Normal heart size.  Normal caliber thoracic aorta.  Calcification of coronary arteries and aorta.  No significant lymphadenopathy in the chest.  The esophagus is mostly decompressed.  Mild enlargement of the thyroid gland.  No pleural effusions.  Visualization of lung fields is limited by respiratory motion artifact but there is no gross airspace consolidation or significant edema  pattern.  No pneumothorax. Airways appear patent.  Degenerative changes in the thoracic spine.  IMPRESSION: No evidence of significant pulmonary embolus.   Original Report Authenticated By: Marlon Pel, M.D.    Mr Laqueta Jean [facility] Contrast  01/08/2012  *RADIOLOGY REPORT*  Clinical Data: Dizziness.  Nausea.  History of stroke.  Diabetic.  MRI HEAD WITHOUT AND WITH CONTRAST  Technique:  Multiplanar, multiecho pulse sequences of the brain and surrounding structures were obtained according to standard protocol without and with intravenous contrast  Contrast: 20mL MULTIHANCE GADOBENATE DIMEGLUMINE 529 MG/ML IV SOLN  Comparison: 12/23/2011 CT.  12/15/2011 MR.  Findings: Subtle altered signal intensity in the superior left cerebellum on diffusion sequence. Question artifact versus very small acute infarct?  Scattered small remote infarcts within the hemisphere bilaterally, thalamus bilaterally and basal ganglia bilaterally.  Small vessel disease type changes.  No intracranial hemorrhage.  Global atrophy without hydrocephalus.  No intracranial mass or abnormal enhancement.  Atherosclerotic type changes vertebral arteries and basilar artery which appears small and attenuated.  Partially empty sella.  This may be an incidental finding although also described in patients with pseudotumor cerebri.  Exophthalmos.  Cervical spondylotic changes with spinal stenosis and slight cord flattening C4-5 level.  IMPRESSION:  Subtle altered signal intensity in the superior left cerebellum on diffusion sequence. Question artifact versus very small acute infarct?  Please see above.   Original Report Authenticated By: Fuller Canada, M.D.      Assessment/Plan:   Patient Active Problem List  Diagnosis  . HYPERLIPIDEMIA  . OBESITY  . PERIPHERAL NEUROPATHY  . HYPERTENSION  . CAD, NATIVE VESSEL  . LEFT VENTRICULAR FUNCTION, DECREASED  . CVA (cerebral infarction)  . GERD  . HIATAL HERNIA  . Irritable bowel syndrome  .  SYNCOPE  . DM type 2, uncontrolled, with neuropathy  . Syncope  . Hypokalemia  . HTN (hypertension)  . UTI (lower urinary tract infection)  . H/O: CVA (cardiovascular accident)  . Normocytic anemia  . Facial droop  . Hyperglycemia  . Mild dehydration  . Numbness  . TIA (transient ischemic attack)  . Candiduria  . Paresthesia of right arm and leg  . Cellulitis of left lower extremity  . Diabetic ulcer of left foot  . Hypoglycemia associated with diabetes  . Chest pain  . Dizziness   - Dizziness: Patient with intermittent dizziness for about 2 months now. No focal neuro deficits including normal cerebellar exam. No gait abnormalities.  - Orthostatic dizziness/inner ear pathology cannot be ruled out. Early morning autonomic dysfunction MRI had shows possible small CVA in left cerebellum- but not that impressive- discussed with Dr. Roseanne Reno who reviewed the scan. - Recent TIA/CVA workup in last week of July- was unrevealing for new CVA. - Patient on Plavix already - started during last admission.  Plan : Continue Plavix. - No  further workup needed at this point of time. - Discussed with Dr. Roseanne Reno - if dizziness continues, might need a workup for inner ear pathology as outpatient.     PATEL,RAVI  01/08/2012, 10:20 AM

## 2012-01-12 ENCOUNTER — Encounter (HOSPITAL_COMMUNITY): Payer: Self-pay | Admitting: *Deleted

## 2012-01-12 ENCOUNTER — Encounter (HOSPITAL_COMMUNITY): Payer: Self-pay | Admitting: Pharmacy Technician

## 2012-01-12 NOTE — Progress Notes (Addendum)
[name] called today  And said "no one has called me and I have surgry tomorrow."  Pt said she saw Dr Victorino Dike on Friday and she is having a BKA.   [name] said she has no way to get to the hospital today and she was discharged on Thursday, January 08, 2012.  Pt said she in the hospital for chest pain and dizziness "this time and for a stroke in July" .  Pt deneis any further chest pain.  Pt said that she see Dr Daleen Squibb with [facility]. I do not see any notes this year from [place] office.  Pt was seen in 2012 and had a loop recorder inserted and removed.   D/c notes from August mention that pt to follow up with Dr Daleen Squibb and that his office will call pt.  July admission MRI shows a small frontal infarct.  Pt's meds were changed from ASA and Aggrenox and was started on Plavix.  2D Echo at this time shows EF of 45- 50%., neg Carotid Dopplers. No residual effects from TIA versus Stroke.,  Pt had surgery for toe amp in early August  and mid August without any complications  Pt was seen   by Neuro , who wrote if 'dizzy spells return that patient should be worked up for inner ear."  Pt was admitted to the hospital Jan 07, 2012 with chest pain.  Pt has a chest angiogram , PE was ruled out, 2 neg cardiac enzymes. .    I called Dr Hewitt's office for orders and to see if MD had requested medical clearance. I spoke with Dr Ivin Booty regarding above information.

## 2012-01-13 ENCOUNTER — Encounter (HOSPITAL_COMMUNITY): Payer: Self-pay | Admitting: Anesthesiology

## 2012-01-13 ENCOUNTER — Ambulatory Visit (HOSPITAL_COMMUNITY): Payer: Managed Care, Other (non HMO) | Admitting: Anesthesiology

## 2012-01-13 ENCOUNTER — Inpatient Hospital Stay (HOSPITAL_COMMUNITY)
Admission: RE | Admit: 2012-01-13 | Discharge: 2012-01-16 | DRG: 617 | Disposition: A | Discharge status: Skilled Nursing Facility | Payer: Managed Care, Other (non HMO) | Source: Ambulatory Visit | Attending: Orthopedic Surgery | Admitting: Orthopedic Surgery

## 2012-01-13 ENCOUNTER — Encounter (HOSPITAL_COMMUNITY)
Admission: RE | Disposition: A | Discharge status: Skilled Nursing Facility | Payer: Self-pay | Source: Ambulatory Visit | Attending: Orthopedic Surgery

## 2012-01-13 ENCOUNTER — Encounter (HOSPITAL_COMMUNITY): Payer: Self-pay | Admitting: *Deleted

## 2012-01-13 DIAGNOSIS — E669 Obesity, unspecified: Secondary | ICD-10-CM | POA: Diagnosis present

## 2012-01-13 DIAGNOSIS — Z7902 Long term (current) use of antithrombotics/antiplatelets: Secondary | ICD-10-CM

## 2012-01-13 DIAGNOSIS — G609 Hereditary and idiopathic neuropathy, unspecified: Secondary | ICD-10-CM | POA: Diagnosis present

## 2012-01-13 DIAGNOSIS — I70269 Atherosclerosis of native arteries of extremities with gangrene, unspecified extremity: Secondary | ICD-10-CM | POA: Diagnosis present

## 2012-01-13 DIAGNOSIS — K589 Irritable bowel syndrome without diarrhea: Secondary | ICD-10-CM | POA: Diagnosis present

## 2012-01-13 DIAGNOSIS — I10 Essential (primary) hypertension: Secondary | ICD-10-CM | POA: Diagnosis present

## 2012-01-13 DIAGNOSIS — E1169 Type 2 diabetes mellitus with other specified complication: Principal | ICD-10-CM | POA: Diagnosis present

## 2012-01-13 DIAGNOSIS — Z79899 Other long term (current) drug therapy: Secondary | ICD-10-CM

## 2012-01-13 DIAGNOSIS — Z794 Long term (current) use of insulin: Secondary | ICD-10-CM

## 2012-01-13 DIAGNOSIS — F17200 Nicotine dependence, unspecified, uncomplicated: Secondary | ICD-10-CM | POA: Diagnosis present

## 2012-01-13 DIAGNOSIS — K219 Gastro-esophageal reflux disease without esophagitis: Secondary | ICD-10-CM | POA: Diagnosis present

## 2012-01-13 DIAGNOSIS — E1159 Type 2 diabetes mellitus with other circulatory complications: Secondary | ICD-10-CM | POA: Diagnosis present

## 2012-01-13 DIAGNOSIS — T8130XA Disruption of wound, unspecified, initial encounter: Secondary | ICD-10-CM

## 2012-01-13 DIAGNOSIS — I2510 Atherosclerotic heart disease of native coronary artery without angina pectoris: Secondary | ICD-10-CM | POA: Diagnosis present

## 2012-01-13 DIAGNOSIS — E785 Hyperlipidemia, unspecified: Secondary | ICD-10-CM | POA: Diagnosis present

## 2012-01-13 DIAGNOSIS — Z8673 Personal history of transient ischemic attack (TIA), and cerebral infarction without residual deficits: Secondary | ICD-10-CM

## 2012-01-13 DIAGNOSIS — E1165 Type 2 diabetes mellitus with hyperglycemia: Secondary | ICD-10-CM

## 2012-01-13 HISTORY — PX: AMPUTATION: SHX166

## 2012-01-13 LAB — CBC
MCH: 27.5 pg (ref 26.0–34.0)
MCV: 84.7 fL (ref 78.0–100.0)
Platelets: 292 10*3/uL (ref 150–400)
RBC: 4.26 MIL/uL (ref 3.87–5.11)
RDW: 14.9 % (ref 11.5–15.5)
WBC: 10.3 10*3/uL (ref 4.0–10.5)

## 2012-01-13 LAB — BASIC METABOLIC PANEL
CO2: 24 mEq/L (ref 19–32)
Calcium: 9.5 mg/dL (ref 8.4–10.5)
Creatinine, Ser: 0.81 mg/dL (ref 0.50–1.10)
GFR calc non Af Amer: 79 mL/min — ABNORMAL LOW (ref >90)
Sodium: 138 mEq/L (ref 135–145)

## 2012-01-13 LAB — GLUCOSE, CAPILLARY: Glucose-Capillary: 197 mg/dL — ABNORMAL HIGH (ref 70–99)

## 2012-01-13 LAB — SURGICAL PCR SCREEN: Staphylococcus aureus: NEGATIVE

## 2012-01-13 SURGERY — AMPUTATION BELOW KNEE
Anesthesia: General | Site: Leg Lower | Laterality: Left | Wound class: Clean

## 2012-01-13 MED ORDER — FENTANYL CITRATE 0.05 MG/ML IJ SOLN
INTRAMUSCULAR | Status: DC | PRN
  Administered 2012-01-13 (×2): 100 ug via INTRAVENOUS
  Administered 2012-01-13: 25 ug via INTRAVENOUS
  Administered 2012-01-13: 150 ug via INTRAVENOUS

## 2012-01-13 MED ORDER — BACITRACIN ZINC 500 UNIT/GM EX OINT
TOPICAL_OINTMENT | CUTANEOUS | Status: DC | PRN
  Administered 2012-01-13: 1 via TOPICAL

## 2012-01-13 MED ORDER — DOCUSATE SODIUM 100 MG PO CAPS
100.0000 mg | ORAL_CAPSULE | Freq: Two times a day (BID) | ORAL | Status: DC
  Administered 2012-01-13 – 2012-01-16 (×6): 100 mg via ORAL
  Filled 2012-01-13 (×7): qty 1

## 2012-01-13 MED ORDER — SIMVASTATIN 40 MG PO TABS
40.0000 mg | ORAL_TABLET | Freq: Every evening | ORAL | Status: DC
  Administered 2012-01-13 – 2012-01-15 (×3): 40 mg via ORAL
  Filled 2012-01-13 (×4): qty 1

## 2012-01-13 MED ORDER — METFORMIN HCL 500 MG PO TABS
500.0000 mg | ORAL_TABLET | Freq: Two times a day (BID) | ORAL | Status: DC
  Administered 2012-01-14 – 2012-01-16 (×4): 500 mg via ORAL
  Filled 2012-01-13 (×7): qty 1

## 2012-01-13 MED ORDER — ONDANSETRON HCL 4 MG/2ML IJ SOLN: 4.0000 mg | Freq: Four times a day (QID) | INTRAMUSCULAR | Status: DC | PRN

## 2012-01-13 MED ORDER — ONDANSETRON HCL 4 MG PO TABS: 4.0000 mg | ORAL_TABLET | Freq: Four times a day (QID) | ORAL | Status: DC | PRN

## 2012-01-13 MED ORDER — OXYCODONE HCL 5 MG PO TABS
5.0000 mg | ORAL_TABLET | ORAL | Status: DC | PRN
  Administered 2012-01-13 – 2012-01-14 (×4): 10 mg via ORAL
  Filled 2012-01-13 (×4): qty 2

## 2012-01-13 MED ORDER — ONDANSETRON HCL 4 MG/2ML IJ SOLN
INTRAMUSCULAR | Status: DC | PRN
  Administered 2012-01-13: 4 mg via INTRAVENOUS

## 2012-01-13 MED ORDER — LACTATED RINGERS IV SOLN
INTRAVENOUS | Status: DC
  Administered 2012-01-13: 13:00:00 via INTRAVENOUS

## 2012-01-13 MED ORDER — PROPOFOL 10 MG/ML IV EMUL
INTRAVENOUS | Status: DC | PRN
  Administered 2012-01-13: 200 mg via INTRAVENOUS

## 2012-01-13 MED ORDER — LIDOCAINE HCL (CARDIAC) 20 MG/ML IV SOLN
INTRAVENOUS | Status: DC | PRN
  Administered 2012-01-13: 80 mg via INTRAVENOUS

## 2012-01-13 MED ORDER — HYDROMORPHONE HCL PF 1 MG/ML IJ SOLN
INTRAMUSCULAR | Status: AC
  Filled 2012-01-13: qty 1

## 2012-01-13 MED ORDER — HYDROMORPHONE HCL PF 1 MG/ML IJ SOLN
0.2500 mg | INTRAMUSCULAR | Status: DC | PRN
  Administered 2012-01-13 (×4): 0.5 mg via INTRAVENOUS

## 2012-01-13 MED ORDER — HYDROMORPHONE HCL PF 1 MG/ML IJ SOLN
0.5000 mg | INTRAMUSCULAR | Status: DC | PRN
  Administered 2012-01-13 – 2012-01-15 (×5): 1 mg via INTRAVENOUS
  Filled 2012-01-13 (×6): qty 1

## 2012-01-13 MED ORDER — METOCLOPRAMIDE HCL 5 MG/ML IJ SOLN: 5.0000 mg | Freq: Three times a day (TID) | INTRAMUSCULAR | Status: DC | PRN

## 2012-01-13 MED ORDER — HYDROMORPHONE HCL PF 1 MG/ML IJ SOLN
INTRAMUSCULAR | Status: AC
  Administered 2012-01-13: 16:00:00
  Filled 2012-01-13: qty 1

## 2012-01-13 MED ORDER — ENOXAPARIN SODIUM 40 MG/0.4ML ~~LOC~~ SOLN
40.0000 mg | SUBCUTANEOUS | Status: DC
  Administered 2012-01-14 – 2012-01-16 (×3): 40 mg via SUBCUTANEOUS
  Filled 2012-01-13 (×4): qty 0.4

## 2012-01-13 MED ORDER — INSULIN GLARGINE 100 UNIT/ML ~~LOC~~ SOLN
20.0000 [IU] | Freq: Every day | SUBCUTANEOUS | Status: DC
  Administered 2012-01-13 – 2012-01-14 (×2): 20 [IU] via SUBCUTANEOUS

## 2012-01-13 MED ORDER — MIDAZOLAM HCL 5 MG/5ML IJ SOLN
INTRAMUSCULAR | Status: DC | PRN
  Administered 2012-01-13: 1 mg via INTRAVENOUS

## 2012-01-13 MED ORDER — 0.9 % SODIUM CHLORIDE (POUR BTL) OPTIME
TOPICAL | Status: DC | PRN
  Administered 2012-01-13: 1000 mL

## 2012-01-13 MED ORDER — SODIUM CHLORIDE 0.9 % IV SOLN: INTRAVENOUS | Status: DC

## 2012-01-13 MED ORDER — LISINOPRIL 20 MG PO TABS
20.0000 mg | ORAL_TABLET | Freq: Every day | ORAL | Status: DC
  Administered 2012-01-13 – 2012-01-16 (×4): 20 mg via ORAL
  Filled 2012-01-13 (×4): qty 1

## 2012-01-13 MED ORDER — SENNA 8.6 MG PO TABS
1.0000 | ORAL_TABLET | Freq: Two times a day (BID) | ORAL | Status: DC
  Administered 2012-01-13 – 2012-01-16 (×5): 8.6 mg via ORAL
  Filled 2012-01-13 (×7): qty 1

## 2012-01-13 MED ORDER — CHLORHEXIDINE GLUCONATE 4 % EX LIQD: 60.0000 mL | Freq: Once | CUTANEOUS | Status: DC

## 2012-01-13 MED ORDER — METOCLOPRAMIDE HCL 10 MG PO TABS
5.0000 mg | ORAL_TABLET | Freq: Three times a day (TID) | ORAL | Status: DC | PRN
Start: 2012-01-13 — End: 2012-01-16

## 2012-01-13 MED ORDER — INSULIN ASPART 100 UNIT/ML ~~LOC~~ SOLN
6.0000 [IU] | Freq: Three times a day (TID) | SUBCUTANEOUS | Status: DC
  Administered 2012-01-14 – 2012-01-16 (×3): 6 [IU] via SUBCUTANEOUS

## 2012-01-13 MED ORDER — MUPIROCIN 2 % EX OINT
TOPICAL_OINTMENT | CUTANEOUS | Status: AC
  Administered 2012-01-13: 1 via NASAL
  Filled 2012-01-13: qty 22

## 2012-01-13 MED ORDER — SODIUM CHLORIDE 0.9 % IV SOLN
INTRAVENOUS | Status: DC
  Administered 2012-01-13 – 2012-01-14 (×2): via INTRAVENOUS

## 2012-01-13 MED ORDER — CLOPIDOGREL BISULFATE 75 MG PO TABS
75.0000 mg | ORAL_TABLET | Freq: Every day | ORAL | Status: DC
  Administered 2012-01-14 – 2012-01-16 (×3): 75 mg via ORAL
  Filled 2012-01-13 (×4): qty 1

## 2012-01-13 MED ORDER — GLIMEPIRIDE 4 MG PO TABS
4.0000 mg | ORAL_TABLET | Freq: Every day | ORAL | Status: DC
  Administered 2012-01-14 – 2012-01-16 (×3): 4 mg via ORAL
  Filled 2012-01-13 (×4): qty 1

## 2012-01-13 MED ORDER — MUPIROCIN 2 % EX OINT
TOPICAL_OINTMENT | Freq: Two times a day (BID) | CUTANEOUS | Status: DC
  Administered 2012-01-13: 1 via NASAL
  Administered 2012-01-14: 22:00:00 via NASAL
  Administered 2012-01-15: 1 via NASAL
  Filled 2012-01-13: qty 22

## 2012-01-13 MED ORDER — LACTATED RINGERS IV SOLN
INTRAVENOUS | Status: DC | PRN
  Administered 2012-01-13: 13:00:00 via INTRAVENOUS

## 2012-01-13 MED ORDER — DIPHENHYDRAMINE HCL 50 MG/ML IJ SOLN
INTRAMUSCULAR | Status: AC
  Administered 2012-01-13: 12.5 mg
  Filled 2012-01-13: qty 1

## 2012-01-13 MED ORDER — VANCOMYCIN HCL IN DEXTROSE 1-5 GM/200ML-% IV SOLN
1000.0000 mg | INTRAVENOUS | Status: AC
  Administered 2012-01-13: 1000 mg via INTRAVENOUS

## 2012-01-13 MED ORDER — DIAZEPAM 5 MG PO TABS
5.0000 mg | ORAL_TABLET | Freq: Three times a day (TID) | ORAL | Status: DC | PRN
  Administered 2012-01-13 – 2012-01-14 (×2): 5 mg via ORAL
  Filled 2012-01-13 (×3): qty 1

## 2012-01-13 MED ORDER — BACITRACIN ZINC 500 UNIT/GM EX OINT
TOPICAL_OINTMENT | CUTANEOUS | Status: AC
  Filled 2012-01-13: qty 15

## 2012-01-13 MED ORDER — ZOLPIDEM TARTRATE 5 MG PO TABS: 5.0000 mg | ORAL_TABLET | Freq: Every evening | ORAL | Status: DC | PRN

## 2012-01-13 MED ORDER — PHENYLEPHRINE HCL 10 MG/ML IJ SOLN
INTRAMUSCULAR | Status: DC | PRN
  Administered 2012-01-13: 120 ug via INTRAVENOUS
  Administered 2012-01-13 (×2): 80 ug via INTRAVENOUS

## 2012-01-13 SURGICAL SUPPLY — 65 items
BANDAGE ELASTIC 4 VELCRO ST LF (GAUZE/BANDAGES/DRESSINGS) IMPLANT
BANDAGE ELASTIC 6 VELCRO ST LF (GAUZE/BANDAGES/DRESSINGS) ×6 IMPLANT
BANDAGE ESMARK 6X9 LF (GAUZE/BANDAGES/DRESSINGS) ×2 IMPLANT
BLADE SAW RECIP 87.9 MT (BLADE) ×3 IMPLANT
BLADE SAW SGTL MED 73X18.5 STR (BLADE) IMPLANT
BNDG COHESIVE 6X5 TAN STRL LF (GAUZE/BANDAGES/DRESSINGS) IMPLANT
BNDG ESMARK 4X9 LF (GAUZE/BANDAGES/DRESSINGS) IMPLANT
BNDG ESMARK 6X9 LF (GAUZE/BANDAGES/DRESSINGS) ×3
BNDG GAUZE STRTCH 6 (GAUZE/BANDAGES/DRESSINGS) ×3 IMPLANT
CHLORAPREP W/TINT 26ML (MISCELLANEOUS) ×3 IMPLANT
CLOTH BEACON ORANGE TIMEOUT ST (SAFETY) ×3 IMPLANT
COVER SURGICAL LIGHT HANDLE (MISCELLANEOUS) ×3 IMPLANT
CUFF TOURNIQUET SINGLE 34IN LL (TOURNIQUET CUFF) ×3 IMPLANT
CUFF TOURNIQUET SINGLE 44IN (TOURNIQUET CUFF) IMPLANT
DRAPE EXTREMITY T 121X128X90 (DRAPE) ×3 IMPLANT
DRAPE INCISE IOBAN 66X45 STRL (DRAPES) ×3 IMPLANT
DRAPE PROXIMA HALF (DRAPES) IMPLANT
DRAPE U-SHAPE 47X51 STRL (DRAPES) ×6 IMPLANT
DRSG ADAPTIC 3X8 NADH LF (GAUZE/BANDAGES/DRESSINGS) ×3 IMPLANT
DRSG MEPITEL 4X7.2 (GAUZE/BANDAGES/DRESSINGS) IMPLANT
DRSG PAD ABDOMINAL 8X10 ST (GAUZE/BANDAGES/DRESSINGS) ×3 IMPLANT
DURAPREP 26ML APPLICATOR (WOUND CARE) IMPLANT
ELECT CAUTERY BLADE 6.4 (BLADE) ×3 IMPLANT
ELECT REM PT RETURN 9FT ADLT (ELECTROSURGICAL) ×3
ELECTRODE REM PT RTRN 9FT ADLT (ELECTROSURGICAL) ×2 IMPLANT
EVACUATOR 1/8 PVC DRAIN (DRAIN) IMPLANT
GLOVE BIO SURGEON STRL SZ8 (GLOVE) ×6 IMPLANT
GLOVE BIOGEL PI IND STRL 8 (GLOVE) ×2 IMPLANT
GLOVE BIOGEL PI INDICATOR 8 (GLOVE) ×1
GOWN PREVENTION PLUS XLARGE (GOWN DISPOSABLE) ×3 IMPLANT
GOWN STRL NON-REIN LRG LVL3 (GOWN DISPOSABLE) ×3 IMPLANT
IMMOBILIZER KNEE 20 (SOFTGOODS) ×3
IMMOBILIZER KNEE 20 THIGH 36 (SOFTGOODS) ×2 IMPLANT
KIT BASIN OR (CUSTOM PROCEDURE TRAY) ×3 IMPLANT
KIT ROOM TURNOVER OR (KITS) ×3 IMPLANT
MANIFOLD NEPTUNE II (INSTRUMENTS) ×3 IMPLANT
NS IRRIG 1000ML POUR BTL (IV SOLUTION) ×3 IMPLANT
PACK GENERAL/GYN (CUSTOM PROCEDURE TRAY) IMPLANT
PACK ORTHO EXTREMITY (CUSTOM PROCEDURE TRAY) ×3 IMPLANT
PAD ARMBOARD 7.5X6 YLW CONV (MISCELLANEOUS) ×6 IMPLANT
PAD CAST 4YDX4 CTTN HI CHSV (CAST SUPPLIES) IMPLANT
PADDING CAST ABS 6INX4YD NS (CAST SUPPLIES) ×1
PADDING CAST ABS COTTON 6X4 NS (CAST SUPPLIES) ×2 IMPLANT
PADDING CAST COTTON 4X4 STRL (CAST SUPPLIES)
PADDING CAST COTTON 6X4 STRL (CAST SUPPLIES) ×3 IMPLANT
SPONGE GAUZE 4X4 12PLY (GAUZE/BANDAGES/DRESSINGS) ×3 IMPLANT
SPONGE LAP 18X18 X RAY DECT (DISPOSABLE) ×6 IMPLANT
STAPLER VISISTAT 35W (STAPLE) ×3 IMPLANT
STOCKINETTE IMPERVIOUS LG (DRAPES) IMPLANT
SUCTION FRAZIER TIP 10 FR DISP (SUCTIONS) IMPLANT
SUT ETHILON 2 0 PSLX (SUTURE) IMPLANT
SUT MNCRL AB 3-0 PS2 18 (SUTURE) ×3 IMPLANT
SUT PDS 2 0 (SUTURE) IMPLANT
SUT PDS AB 0 CT 36 (SUTURE) ×6 IMPLANT
SUT PROLENE 3 0 PS 2 (SUTURE) ×3 IMPLANT
SUT PROLENE 3 0 SH 48 (SUTURE) IMPLANT
SUT SILK 2 0 (SUTURE) ×1
SUT SILK 2-0 18XBRD TIE 12 (SUTURE) ×2 IMPLANT
SWAB COLLECTION DEVICE MRSA (MISCELLANEOUS) IMPLANT
TOWEL OR 17X24 6PK STRL BLUE (TOWEL DISPOSABLE) ×3 IMPLANT
TOWEL OR 17X26 10 PK STRL BLUE (TOWEL DISPOSABLE) ×3 IMPLANT
TUBE ANAEROBIC SPECIMEN COL (MISCELLANEOUS) IMPLANT
TUBE CONNECTING 12X1/4 (SUCTIONS) ×3 IMPLANT
UNDERPAD 30X30 INCONTINENT (UNDERPADS AND DIAPERS) ×3 IMPLANT
WATER STERILE IRR 1000ML POUR (IV SOLUTION) ×3 IMPLANT

## 2012-01-13 NOTE — Progress Notes (Signed)
Patient complained of diffuse itching.  Order obtained for benedryl 12.5 x2 prn itching and administered.

## 2012-01-13 NOTE — Anesthesia Preprocedure Evaluation (Addendum)
Anesthesia Evaluation  Patient identified by MRN, date of birth, ID band Patient awake    Reviewed: Allergy & Precautions, H&P , NPO status , Patient's Chart, lab work & pertinent test results  Airway Mallampati: II      Dental   Pulmonary neg pulmonary ROS,  breath sounds clear to auscultation        Cardiovascular hypertension, Pt. on medications + angina with exertion + CAD Rhythm:Regular Rate:Normal     Neuro/Psych TIACVA    GI/Hepatic Neg liver ROS, GERD-  ,  Endo/Other  Well Controlled, Type 2  Renal/GU negative Renal ROS     Musculoskeletal   Abdominal   Peds  Hematology negative hematology ROS (+)   Anesthesia Other Findings   Reproductive/Obstetrics                          Anesthesia Physical Anesthesia Plan  ASA: III  Anesthesia Plan: General   Post-op Pain Management:    Induction: Intravenous  Airway Management Planned: Oral ETT  Additional Equipment:   Intra-op Plan:   Post-operative Plan: Extubation in OR  Informed Consent:   Dental advisory given  Plan Discussed with: CRNA, Anesthesiologist and Surgeon  Anesthesia Plan Comments:        Anesthesia Quick Evaluation

## 2012-01-13 NOTE — Transfer of Care (Signed)
Immediate Anesthesia Transfer of Care Note  Patient: [name]  Procedure(s) Performed: Procedure(s) (LRB): AMPUTATION BELOW KNEE (Left)  Patient Location: PACU  Anesthesia Type: General  Level of Consciousness: awake, alert  and oriented  Airway & Oxygen Therapy: Patient Spontanous Breathing  Post-op Assessment: Report given to PACU RN and Post -op Vital signs reviewed and stable  Post vital signs: Reviewed and stable  Complications: No apparent anesthesia complications

## 2012-01-13 NOTE — Anesthesia Procedure Notes (Signed)
Procedure Name: LMA Insertion Date/Time: 01/13/2012 2:07 PM Performed by: Arlice Colt B Pre-anesthesia Checklist: Patient identified, Emergency Drugs available, Suction available, Patient being monitored and Timeout performed Patient Re-evaluated:Patient Re-evaluated prior to inductionOxygen Delivery Method: Circle system utilized Preoxygenation: Pre-oxygenation with 100% oxygen Intubation Type: IV induction LMA: LMA inserted LMA Size: 4.0 Number of attempts: 1 Placement Confirmation: positive ETCO2 and breath sounds checked- equal and bilateral Tube secured with: Tape Dental Injury: Teeth and Oropharynx as per pre-operative assessment

## 2012-01-13 NOTE — Brief Op Note (Signed)
01/13/2012  3:27 PM  PATIENT:  [name]  56 y.o. 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  DICTATION ID:  [id number]

## 2012-01-13 NOTE — Anesthesia Postprocedure Evaluation (Signed)
  Anesthesia Post-op Note  Patient: [name]  Procedure(s) Performed: Procedure(s) (LRB): AMPUTATION BELOW KNEE (Left)  Patient Location: PACU  Anesthesia Type: General  Level of Consciousness: awake  Airway and Oxygen Therapy: Patient Spontanous Breathing  Post-op Pain: mild  Post-op Assessment: Post-op Vital signs reviewed, Patient's Cardiovascular Status Stable, Respiratory Function Stable, Patent Airway, No signs of Nausea or vomiting and Pain level controlled  Post-op Vital Signs: stable  Complications: No apparent anesthesia complications

## 2012-01-13 NOTE — H&P (Signed)
[name] is an 56 y.o. female.   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  She presents now for chopart amputation.  Past Medical History  Diagnosis Date  . Coronary atherosclerosis of native coronary artery     non obstructive by cath in 2008 and 2010  . Unspecified essential hypertension   . Other and unspecified hyperlipidemia   . Syncope and collapse     near-syncopal episode in November/2008  . Unspecified cardiovascular disease     Decreased left-ventricular fxn  . 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 Unspecified hereditary and idiopathic peripheral neuropathy   . Obesity, unspecified   . Esophageal reflux   . History of diabetes mellitus, type II   . Diaphragmatic hernia without mention of obstruction or gangrene   . Irritable bowel syndrome   . Morbid obesity   . Diabetes mellitus   . Cellulitis     left foot  . Anginal pain   . Stroke     2013-'no residual'    Past Surgical History  Procedure Date  . Invasive electrophysiologic study 5/09    followed by insertion of an implantable loop recorder. S/p removal   . Colonoscopy   . Cholecystectomy   . Multiple toe surgeries   . Cardiac catheterization     LAD 30%, circumflex 50%, OM 75%, RI 60% with small branch 80%, dominant RCA 60%, EF 45-50%  . I&d extremity 12/23/2011    Procedure: IRRIGATION AND DEBRIDEMENT EXTREMITY;  Surgeon: Verlee Rossetti, MD;  Location: [facility] OR;  Service: Orthopedics;  Laterality: Left;  Left Foot  . I&d extremity 12/26/2011    Procedure: IRRIGATION AND DEBRIDEMENT EXTREMITY;  Surgeon: Toni Arthurs, MD;  Location: [facility] OR;  Service: Orthopedics;  Laterality: Left;  LEFT FOOT I&D WITH POSSIBLE WOUND VAC APPLICATION, POSSIBLE LEFT FIRST RAY AMPUTATION  . Amputation 12/30/2011    Procedure: AMPUTATION RAY;  Surgeon: Toni Arthurs, MD;  Location: [facility] OR;  Service:  Orthopedics;  Laterality: Left;  LEFT FIRST RAY AMPUTATION  . Eye surgery     cataract    Family History  Problem Relation Age of Onset  . Pneumonia Mother   . Gallbladder disease Mother     cancer  . Heart failure Mother   . Diabetes Father   . Coronary artery disease Father   . Stroke Neg Hx    Social History:  reports that she has been smoking Cigarettes.  She has a 10 pack-year smoking history. She has never used smokeless tobacco. She reports that she drinks about 2.4 ounces of alcohol per week. She reports that she does not use illicit drugs.  Allergies:  Allergies  Allergen Reactions  . Banana     Face puffs up  . Penicillins Itching     edema  . Strawberry     Face puffs up    Medications Prior to Admission  Medication Sig Dispense Refill  . clopidogrel (PLAVIX) 75 MG tablet Take 1 tablet (75 mg total) by mouth daily with breakfast.  30 tablet  3  . glimepiride (AMARYL) 4 MG tablet Take 4 mg by mouth daily.       Marland Kitchen HYDROcodone-acetaminophen (NORCO/VICODIN) 5-325 MG per tablet Take 1 tablet by mouth every 4 (four) hours as needed. For pain      . insulin aspart (NOVOLOG) 100 UNIT/ML injection Inject 6 Units into  the skin 3 (three) times daily with meals.  1 vial  3  . insulin glargine (LANTUS) 100 UNIT/ML injection Inject 20 Units into the skin at bedtime.      Marland Kitchen lisinopril (PRINIVIL,ZESTRIL) 20 MG tablet Take 20 mg by mouth daily.      . metFORMIN (GLUCOPHAGE) 500 MG tablet Take 500 mg by mouth 2 (two) times daily with a meal.      . simvastatin (ZOCOR) 40 MG tablet Take 40 mg by mouth every evening.        Results for orders placed during the hospital encounter of February 02, 2012 (from the past 48 hour(s))  GLUCOSE, CAPILLARY     Status: Abnormal   Collection Time   2012/02/02 10:47 AM      Component Value Range Comment   Glucose-Capillary 197 (*) 70 - 99 mg/dL   SURGICAL PCR SCREEN     Status: Normal   Collection Time   02-Feb-2012 11:48 AM      Component Value Range  Comment   MRSA, PCR NEGATIVE  NEGATIVE    Staphylococcus aureus NEGATIVE  NEGATIVE   BASIC METABOLIC PANEL     Status: Abnormal   Collection Time   02-Feb-2012 12:06 PM      Component Value Range Comment   Sodium 138  135 - 145 mEq/L    Potassium 4.1  3.5 - 5.1 mEq/L    Chloride 102  96 - 112 mEq/L    CO2 24  19 - 32 mEq/L    Glucose, Bld 208 (*) 70 - 99 mg/dL    BUN 15  6 - 23 mg/dL    Creatinine, Ser 1.61  0.50 - 1.10 mg/dL    Calcium 9.5  8.4 - 09.6 mg/dL    GFR calc non Af Amer 79 (*) >90 mL/min    GFR calc Af Amer >90  >90 mL/min   CBC     Status: Abnormal   Collection Time   2012-02-02 12:06 PM      Component Value Range Comment   WBC 10.3  4.0 - 10.5 K/uL    RBC 4.26  3.87 - 5.11 MIL/uL    Hemoglobin 11.7 (*) 12.0 - 15.0 g/dL    HCT 04.5  40.9 - 81.1 %    MCV 84.7  78.0 - 100.0 fL    MCH 27.5  26.0 - 34.0 pg    MCHC 32.4  30.0 - 36.0 g/dL    RDW 91.4  78.2 - 95.6 %    Platelets 292  150 - 400 K/uL    No results found.  ROS  No recent f/c/n/v/wt loss  Blood pressure 132/82, pulse 98, temperature 97.5 F (36.4 C), temperature source Oral, resp. rate 18, SpO2 100.00%. Physical Exam wn wdwoman in nad.  A and O x 4.  Mood and affect normal.  EOMI.  Respirations unlabored.  L forefoot with gangrenous medial half.  DP and PT pulses not palpable.  No cellulitis.  Diminished sens to LT at forefoot.  Tight heelcord.  5/5 strength in PF and DF of foot.  Assessment/Plan Right forefoot gangrene - to OR for chopart v. BK amputation.  The risks and benefits of the alternative treatment options have been discussed in detail.  The patient wishes to proceed with surgery and specifically understands risks of bleeding, infection, nerve damage, blood clots, need for additional surgery, amputation and death.   Toni Arthurs 2012/02/02, 1:56 PM

## 2012-01-14 LAB — GLUCOSE, CAPILLARY
Glucose-Capillary: 163 mg/dL — ABNORMAL HIGH (ref 70–99)
Glucose-Capillary: 230 mg/dL — ABNORMAL HIGH (ref 70–99)
Glucose-Capillary: 98 mg/dL (ref 70–99)

## 2012-01-14 LAB — CBC
HCT: 32.5 % — ABNORMAL LOW (ref 36.0–46.0)
Hemoglobin: 10.4 g/dL — ABNORMAL LOW (ref 12.0–15.0)
MCH: 27.5 pg (ref 26.0–34.0)
MCHC: 32 g/dL (ref 30.0–36.0)
RBC: 3.78 MIL/uL — ABNORMAL LOW (ref 3.87–5.11)

## 2012-01-14 LAB — BASIC METABOLIC PANEL
BUN: 14 mg/dL (ref 6–23)
Chloride: 102 mEq/L (ref 96–112)
GFR calc Af Amer: 76 mL/min — ABNORMAL LOW (ref >90)
Glucose, Bld: 172 mg/dL — ABNORMAL HIGH (ref 70–99)
Potassium: 3.7 mEq/L (ref 3.5–5.1)
Sodium: 136 mEq/L (ref 135–145)

## 2012-01-14 MED ORDER — OXYCODONE HCL 5 MG PO TABS
5.0000 mg | ORAL_TABLET | ORAL | Status: DC | PRN
  Administered 2012-01-14 – 2012-01-15 (×2): 10 mg via ORAL
  Administered 2012-01-15: 15 mg via ORAL
  Filled 2012-01-14: qty 3
  Filled 2012-01-14 (×2): qty 2

## 2012-01-14 NOTE — Progress Notes (Signed)
01/14/2012 Brought amputee exercise sheet by.  Encouraged pt to perform exercises on her own and we will review them in therapy again tomorrow. Thanks, Rollene Rotunda. Jerrett Baldinger, PT, DPT [phone]

## 2012-01-14 NOTE — Progress Notes (Signed)
Subjective: 1 Day Post-Op Procedure(s) (LRB): AMPUTATION BELOW KNEE (Left) Patient reports pain as moderate.  Not much relief with oxycodone 10 mg.   Objective: Vital signs in last 24 hours: Temp:  [97.4 F (36.3 C)-98.2 F (36.8 C)] 97.4 F (36.3 C) (08/28 0634) Pulse Rate:  [89-94] 94  (08/28 0634) Resp:  [15-21] 20  (08/28 0634) BP: (150-193)/(72-103) 155/72 mmHg (08/28 0634) SpO2:  [97 %-100 %] 98 % (08/28 0634)  Intake/Output from previous day: 08/27 0701 - 08/28 0700 In: 1840 [P.O.:240; I.V.:1600] Out: 900 [Urine:700; Blood:200] Intake/Output this shift:     Basename 01/14/12 0600 01/13/12 1206  HGB 10.4* 11.7*    Basename 01/14/12 0600 01/13/12 1206  WBC 9.9 10.3  RBC 3.78* 4.26  HCT 32.5* 36.1  PLT 255 292    Basename 01/14/12 0600 01/13/12 1206  NA 136 138  K 3.7 4.1  CL 102 102  CO2 27 24  BUN 14 15  CREATININE 0.95 0.81  GLUCOSE 172* 208*  CALCIUM 8.8 9.5     L LE dressed and dry.  Knee immobilizer in place.   Assessment/Plan: 1 Day Post-Op Procedure(s) (LRB): AMPUTATION BELOW KNEE (Left) Up with therapy  Likely pt will need SNF placement.  We'll inrease pain meds.  Toni Arthurs 01/14/2012, 2:13 PM

## 2012-01-14 NOTE — Progress Notes (Addendum)
Inpatient Diabetes Program Recommendations  AACE/ADA: New Consensus Statement on Inpatient Glycemic Control (2013)  Target Ranges:  Prepandial:   less than 140 mg/dL      Peak postprandial:   less than 180 mg/dL (1-2 hours)      Critically ill patients:  140 - 180 mg/dL   Reason for Visit:  Results for [name], [name] (MRN [id number]) as of 01/14/2012 10:44  Ref. Range 01/08/2012 05:55 01/08/2012 12:50 01/08/2012 16:18 01/13/2012 10:47 01/13/2012 15:36  Glucose-Capillary Latest Range: 70-99 mg/dL 846 (H) 962 (H) 952 (H) 197 (H) 194 (H)   Please add Novolog correction-Sensitive tid with meals.  Will follow.  Note A1C=8.7% indicating sub-optimal glycemic control prior to admit.  Needs follow-up with PCP.

## 2012-01-14 NOTE — Evaluation (Addendum)
Physical Therapy Evaluation Patient Details Name: [name] MRN: [id number] DOB: 21-Mar-1956 Today's Date: 01/14/2012 Time: 4098-1191 PT Time Calculation (min): 17 min  PT Assessment / Plan / Recommendation Clinical Impression  56 y.o. female admitted to [facility] now s/p L BKA.  She is POD #1 and is progressing well with mobility.  She lives alone and would benefit from inpatient rehab to maximize her independence before returning home      PT Assessment  Patient needs continued PT services    Follow Up Recommendations  Inpatient Rehab    Barriers to Discharge        Equipment Recommendations  Wheelchair (measurements)    Recommendations for Other Services Rehab consult   Frequency Min 5X/week    Precautions / Restrictions Precautions Required Braces or Orthoses: Knee Immobilizer - Left Knee Immobilizer - Left: Other (comment) (noting specified in orders) Restrictions LLE Weight Bearing: Non weight bearing   Pertinent Vitals/Pain 4/10 pain in left leg, RN notified via call bell for pain meds request      Mobility  Transfers Sit to Stand: 4: Min assist Stand to Sit: 4: Min assist Stand Pivot Transfers: 4: Min assist (with RW) Details for Transfer Assistance: with RW transferred from 3-in-1 to recliner chair on the pt's left side, op, pivotal steps taken with RW.   Ambulation/Gait Ambulation/Gait Assistance: Not tested (comment) (fatugued after transfer and would require +2 for safety )    Exercises General Exercises - Lower Extremity Quad Sets: AROM;Left;10 reps;Supine Hip ABduction/ADduction: AROM;Left;10 reps;Supine   PT Diagnosis: Difficulty walking;Abnormality of gait;Generalized weakness;Acute pain  PT Problem List: Decreased strength;Decreased activity tolerance;Decreased range of motion;Decreased balance;Decreased mobility;Decreased knowledge of use of DME;Pain PT Treatment Interventions: DME instruction;Gait training;Therapeutic activities;Functional mobility  training;Balance training;Therapeutic exercise;Neuromuscular re-education;Patient/family education;Wheelchair mobility training   PT Goals Acute Rehab PT Goals PT Goal Formulation: With patient Time For Goal Achievement: 01/21/12 Potential to Achieve Goals: Good Pt will go Supine/Side to Sit: with modified independence PT Goal: Supine/Side to Sit - Progress: Goal set today Pt will go Sit to Supine/Side: with modified independence PT Goal: Sit to Supine/Side - Progress: Goal set today Pt will go Sit to Stand: with modified independence PT Goal: Sit to Stand - Progress: Goal set today Pt will go Stand to Sit: with modified independence PT Goal: Stand to Sit - Progress: Goal set today Pt will Transfer Bed to Chair/Chair to Bed: with modified independence PT Transfer Goal: Bed to Chair/Chair to Bed - Progress: Goal set today Pt will Ambulate: 1 - 15 feet;with min assist;with rolling walker PT Goal: Ambulate - Progress: Goal set today Pt will Go Up / Down Stairs: Other (comment) (goal from last admission- not for this admission) PT Goal: Up/Down Stairs - Progress: Discontinued (comment) (goal from last admission- discharge goal)  Visit Information  Last PT Received On: 01/14/12 Assistance Needed: +1 (+2 helpful for gait- chair to follow))    Subjective Data  Subjective: Pt asking questions about how long it will take to get her prosthetic leg.   Patient Stated Goal: home   Prior Functioning  Home Living Lives With: Alone Available Help at Discharge: Family;Available PRN/intermittently Type of Home: House Home Access: Stairs to enter [employer] of Steps: 1 Home Layout: One level Bathroom Shower/Tub: Forensic scientist: Standard Home Adaptive Equipment: Walker - rolling;Straight cane;Bedside commode/3-in-1 Prior Function Level of Independence: Independent with assistive device(s) Able to Take Stairs?: Yes Driving: Yes Vocation: Full time  employment Communication Communication: No difficulties Dominant  Hand: Right    Cognition  Overall Cognitive Status: Appears within functional limits for tasks assessed/performed    Extremity/Trunk Assessment Right Lower Extremity Assessment RLE ROM/Strength/Tone: [facility] for tasks assessed Left Lower Extremity Assessment LLE ROM/Strength/Tone: Deficits;Due to pain LLE ROM/Strength/Tone Deficits: decreased ROM at the knee due to pain.  Strength at the knee 3-/5, hip 3-/5   Balance    End of Session PT - End of Session Activity Tolerance: Patient limited by fatigue;Patient limited by pain Patient left: in chair;with call bell/phone within reach  GP     Mery Guadalupe B. Jamelle Goldston, PT, DPT [phone]   01/14/2012, 1:00 PM

## 2012-01-14 NOTE — Op Note (Signed)
NAMECYERA, BALBONI NO.:  [id number]  MEDICAL RECORD NO.:  [id number]  LOCATION:  5N21C                        FACILITY:  [facility]  PHYSICIAN:  Toni Arthurs, MD        DATE OF BIRTH:  1955/12/09  DATE OF PROCEDURE:  01/13/2012 DATE OF DISCHARGE:                              OPERATIVE REPORT   PREOPERATIVE DIAGNOSIS:  Left foot progressive gangrene, status post first ray amputation.  POSTOPERATIVE DIAGNOSIS:  Left foot progressive gangrene, status post first ray amputation.  PROCEDURE:  Left below-knee amputation.  SURGEON:  Toni Arthurs, MD  ANESTHESIA:  General.  ESTIMATED BLOOD LOSS:  100 mL.  TOURNIQUET TIME:  51 minute at 350 mmHg.  COMPLICATIONS:  None apparent.  DISPOSITION:  Extubated, awake and stable to recovery.  INDICATIONS FOR PROCEDURE:  The patient is a 56 year old woman with past medical history significant for diabetes, peripheral vascular disease, and smoking.  She had a left foot ulcer that went on to gangrene of the medial forefoot.  She underwent left first ray amputation several weeks ago.  Upon return to the office in the postoperative period, she had developed progression of the gangrene with wound dehiscence.  At this point, the forefoot has compromised healing ability and has insufficient skin to close a more distal level of amputation.  She presents now for below-knee amputation.  She understands risks and benefits, the alternative treatment options and elects surgical treatment.  She specifically understands the risks of bleeding, infection, nerve damage, blood clots, need for additional surgery, revision of amputation, and death.  PROCEDURE IN DETAIL:  After preoperative consent was obtained and the correct operative site was identified, the patient was brought to the operating room and placed supine on the operating table where general anesthesia was induced.  Preoperative antibiotics were administered. Surgical time-out  was taken.  The left lower extremity was prepped and draped in standard sterile fashion.  The forefoot wound was examined before preparing the limb.  It was noted to have a dusky appearance and no palpable pulses.  The forefoot had insufficient skin to be able to close either a transmetatarsal or Chopart level of amputation.  The decision was made to proceed to a below-knee amputation.  The amputation incision was marked on the skin.  The extremity was exsanguinated and tourniquet was inflated to 350 mmHg.  The previously marked incision was made.  Sharp dissection was carried down through the skin and subcutaneous tissue to the level of the anterior tibia.  The periosteum was elevated proximally.  A reciprocating saw was used to cut through the tibia approximately 1 cm proximal from the level of the incision. Blunt dissection was then carried down to the fibula and it was cut approximately 2 cm proximal from the tibial cut.  The amputation knife was then used to bevel the posterior flap.  The amputated extremity was passed off the field as a specimen to Pathology.  All named nerves were cut with a #10 blade while held on traction.  They were allowed to retract into healthy soft tissue.  All named neurovascular bundles were doubly suture ligated.  The posterior flap was further contoured by removing muscle to  allow for a tension-free closure of the skin incision.  The wound was irrigated copiously.  Hemostasis was achieved. 0 PDS imbricating sutures were used to repair the gastrocnemius fascia to the anterior periosteum of the tibia.  The subcutaneous tissue was approximated with inverted simple sutures of 3-0 Monocryl.  The skin was closed with a running 3-0 Prolene.  Sterile dressings were applied followed by a compression wrap and a knee immobilizer.  The patient was then awakened from anesthesia and transported to the recovery room in stable condition.  The tourniquet had been released  at 51 minute after application of the dressings.  FOLLOWUP PLAN:  The patient will be admitted for evaluation by physical therapy and occupational therapy and likely placement in a Rehab Facility.     Toni Arthurs, MD     JH/MEDQ  D:  01/13/2012  T:  01/14/2012  Job:  [id number]

## 2012-01-14 NOTE — Progress Notes (Signed)
UR COMPLETED  

## 2012-01-14 NOTE — Clinical Social Work Psychosocial (Signed)
     Clinical Social Work Department BRIEF PSYCHOSOCIAL ASSESSMENT 01/14/2012  Patient:  [name], [name]     Account Number:  [id number]     Admit date:  01/13/2012  Clinical Social Worker:  Burnard Hawthorne  Date/Time:  01/14/2012 04:08 PM  Referred by:  Physician  Date Referred:  01/14/2012 Referred for  SNF Placement   Other Referral:   Interview type:  Patient Other interview type:    PSYCHOSOCIAL DATA Living Status:  ALONE Admitted from facility:   Level of care:   Primary support name:   Primary support relationship to patient:  SIBLING Degree of support available:   Strong Support from her sister, neices, nephews and her father who all live in the area.    CURRENT CONCERNS Current Concerns  Post-Acute Placement   Other Concerns:    SOCIAL WORK ASSESSMENT / PLAN Met with patient- she lives alone and states that although she has a strong supportive famliy- she does not have anyone who can stay with her 24 hours a day at d/c. Discussed short term SNF and she is agreeable. Fl2 placed on chart for MD signature. Patient provided a list of SNFs. She does not have a specific preference; however- patient is aware that due to her private insurance Counselling psychologist) her SNF choices will be somewhat limited. Bed search intiated.   Assessment/plan status:  Psychosocial Support/Ongoing Assessment of Needs Other assessment/ plan:   Information/referral to community resources:   SNF bed list  After care needs discussed (Possible HH/DME) to be arranged by SNF if indicated.    PATIENTS/FAMILYS RESPONSE TO PLAN OF CARE: Patient was pleased with the d/c plan that was discussed and she is open to considering any and all SNF options. Patient is alert, oriented and very pleasant. She has a positive attitude.

## 2012-01-15 ENCOUNTER — Encounter (HOSPITAL_COMMUNITY): Payer: Self-pay | Admitting: Orthopedic Surgery

## 2012-01-15 DIAGNOSIS — I739 Peripheral vascular disease, unspecified: Secondary | ICD-10-CM

## 2012-01-15 DIAGNOSIS — L98499 Non-pressure chronic ulcer of skin of other sites with unspecified severity: Secondary | ICD-10-CM

## 2012-01-15 DIAGNOSIS — S88119A Complete traumatic amputation at level between knee and ankle, unspecified lower leg, initial encounter: Secondary | ICD-10-CM

## 2012-01-15 LAB — BASIC METABOLIC PANEL
BUN: 14 mg/dL (ref 6–23)
Calcium: 8.7 mg/dL (ref 8.4–10.5)
Chloride: 103 mEq/L (ref 96–112)
Creatinine, Ser: 0.96 mg/dL (ref 0.50–1.10)
GFR calc Af Amer: 75 mL/min — ABNORMAL LOW (ref >90)
GFR calc non Af Amer: 64 mL/min — ABNORMAL LOW (ref >90)

## 2012-01-15 LAB — GLUCOSE, CAPILLARY
Glucose-Capillary: 73 mg/dL (ref 70–99)
Glucose-Capillary: 83 mg/dL (ref 70–99)
Glucose-Capillary: 67 mg/dL — ABNORMAL LOW (ref 70–99)

## 2012-01-15 NOTE — Progress Notes (Signed)
Received call from [facility]-- they have received authorization from [employer] for SNF placement. Discussed with MD- awaiting CIR assessment and if accepted - [employer] auth.  Per MD- patient will be medically stable for d/c tomorrow to either CIR or SNF.  Will await CIR assessment.  Lorri Frederick. [place] Pugh  [phone]

## 2012-01-15 NOTE — Progress Notes (Signed)
Subjective: 2 Days Post-Op Procedure(s) (LRB): AMPUTATION BELOW KNEE (Left) Patient reports pain as mild.  Some phantom sensation.  No phantom pain.  Objective: Vital signs in last 24 hours: Temp:  [98 F (36.7 C)-98.2 F (36.8 C)] 98.2 F (36.8 C) (08/29 1300) Pulse Rate:  [99-101] 101  (08/29 1300) Resp:  [18-20] 18  (08/29 1300) BP: (145-167)/(72-83) 145/83 mmHg (08/29 1300) SpO2:  [98 %-100 %] 100 % (08/29 1300)  Intake/Output from previous day: 08/28 0701 - 08/29 0700 In: 390 [P.O.:390] Out: -  Intake/Output this shift: Total I/O In: 320 [P.O.:320] Out: -    Basename 01/14/12 0600 01/13/12 1206  HGB 10.4* 11.7*    Basename 01/14/12 0600 01/13/12 1206  WBC 9.9 10.3  RBC 3.78* 4.26  HCT 32.5* 36.1  PLT 255 292    Basename 01/15/12 0555 01/14/12 0600  NA 139 136  K 3.3* 3.7  CL 103 102  CO2 26 27  BUN 14 14  CREATININE 0.96 0.95  GLUCOSE 70 172*  CALCIUM 8.7 8.8   No results found for this basename: LABPT:2,INR:2 in the last 72 hours  L LE dressed and dry with knee immobilizer in place.  Assessment/Plan: 2 Days Post-Op Procedure(s) (LRB): AMPUTATION BELOW KNEE (Left) CIR consult.  If not accepted then we'll plan d/c to snf tomorrow.  Toni Arthurs 01/15/2012, 2:46 PM

## 2012-01-15 NOTE — Progress Notes (Signed)
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 Pt will benefit from skilled OT services to max independence with ADL and functional moiblity for ADL due to below deficits. PTA, pt lived independently alone and worked full time. Pt has very supportive family who can assist after D/C. Feel pt is an excellent rehab candidate. Feel pt could get to S/Mod I level with in 2 weeks to return home at w/c/RW level. Pt is very motivated to return to PLOF. In addition to rehab, discussed psychosocial aspects of dealing with limb loss. Pt expressed feelings of loss and was comforted. Pt also educated on importance of desensitization to reduce phantom pain sensation. Discussed pt with Jeannie from CIR.       OT Assessment  Patient needs continued OT Services    Follow Up Recommendations  Inpatient Rehab    Barriers to Discharge None family able to build ramp for entrance  Equipment Recommendations  3 in 1 bedside comode;Rolling walker with 5" wheels;Tub/shower bench;Wheelchair (measurements);Wheelchair cushion (measurements);Defer to next venue    Recommendations for Other Services Rehab consult  Frequency  Min 3X/week    Precautions / Restrictions Precautions Precautions: Fall Required Braces or Orthoses: Knee Immobilizer - Left Restrictions LLE Weight Bearing: Non weight bearing   Pertinent Vitals/Pain 3    ADL  Eating/Feeding: Performed;Independent Grooming: Performed;Set up Where Assessed - Grooming: Supported sitting Upper Body Bathing: Simulated;Set up Where Assessed - Upper Body Bathing: Supported sitting Lower Body Bathing: Simulated;Maximal assistance Where Assessed - Lower Body Bathing: Supported sit to stand Upper Body Dressing: Simulated;Set up Where Assessed - Upper Body  Dressing: Unsupported sitting Lower Body Dressing: Simulated;Maximal assistance Where Assessed - Lower Body Dressing: Supported sit to stand Toilet Transfer: Performed;Moderate assistance Toilet Transfer Method: Sit to stand;Stand pivot Toilet Transfer Equipment: Bedside commode Toileting - Clothing Manipulation and Hygiene: Performed;Moderate assistance Where Assessed - Toileting Clothing Manipulation and Hygiene: Standing Equipment Used: Gait belt Transfers/Ambulation Related to ADLs: Std pivot transfer only with RW. ADL Comments: A for LB ADL. Very motivated to become independent.     OT Diagnosis: Generalized weakness;Acute pain  OT Problem List: Decreased strength;Decreased range of motion;Decreased activity tolerance;Impaired balance (sitting and/or standing);Decreased safety awareness;Decreased knowledge of use of DME or AE;Decreased knowledge of precautions;Obesity;Pain OT Treatment Interventions: Self-care/ADL training;Therapeutic exercise;Energy conservation;DME and/or AE instruction;Therapeutic activities;Patient/family education;Balance training   OT Goals Acute Rehab OT Goals OT Goal Formulation: With patient Time For Goal Achievement: 01/22/12 Potential to Achieve Goals: Good ADL Goals Pt Will Perform Lower Body Bathing: with supervision;with set-up;Supine, head of bed up;Supine, rolling right and/or left;Unsupported ADL Goal: Lower Body Bathing - Progress: Goal set today Pt Will Perform Lower Body Dressing: with set-up;with supervision;Unsupported;Supine, head of bed up;Supine, rolling right and/or left;with cueing (comment type and amount) ADL Goal: Lower Body Dressing - Progress: Goal set today Pt Will Transfer to Toilet: with min assist;Stand pivot transfer;with DME;Extra wide 3-in-1;Maintaining weight bearing status ADL Goal: Toilet Transfer - Progress: Goal set today Pt Will Perform Toileting - Clothing Manipulation: with supervision;Sitting on 3-in-1 or toilet;with  cueing (comment type and amount) (lat leans) ADL Goal: Toileting - Clothing Manipulation - Progress: Goal set today Pt Will Perform Toileting - Hygiene: with supervision;Sitting on 3-in-1 or toilet;Leaning right and/or left on 3-in-1/toilet;with cueing (comment type and amount) ADL Goal: Toileting - Hygiene - Progress:  Goal set today Arm Goals Pt Will Complete Theraband Exer: Independently;Bilateral upper extremities;to increase strength;2 sets;Level 3 Theraband Arm Goal: Theraband Exercises - Progress: Goal set today Miscellaneous OT Goals Miscellaneous OT Goal #1: Pt will complete desensitization activities with min vc to decrease phantom limb sensation. OT Goal: Miscellaneous Goal #1 - Progress: Goal set today  Visit Information  Last OT Received On: 01/15/12 Assistance Needed: +1    Subjective Data      Prior Functioning  Vision/Perception  Home Living Lives With: Alone Available Help at Discharge: Family;Available PRN/intermittently (very supportive family) Type of Home: House Home Access: Stairs to enter [employer] of Steps: 1 Home Layout: One level Bathroom Shower/Tub: Forensic scientist: Standard Bathroom Accessibility: Yes How Accessible: Accessible via walker Home Adaptive Equipment: Walker - rolling;Straight cane;Bedside commode/3-in-1 Prior Function Level of Independence: Independent with assistive device(s) Able to Take Stairs?: Yes Driving: Yes Vocation: Full time employment 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 Communication Communication: No difficulties Dominant Hand: Right      Cognition  Overall Cognitive Status: Appears within functional limits for tasks assessed/performed Arousal/Alertness: Awake/alert Orientation Level: Appears intact for tasks assessed Behavior During Session: [facility] for tasks performed    Extremity/Trunk Assessment Right Upper Extremity Assessment RUE ROM/Strength/Tone: Within functional  levels Left Upper Extremity Assessment LUE ROM/Strength/Tone: Within functional levels   Mobility  Shoulder Instructions  Transfers Transfers: Sit to Stand;Stand to Sit Sit to Stand: From elevated surface;With upper extremity assist;From bed Stand to Sit: 3: Mod assist;To chair/3-in-1;With upper extremity assist Details for Transfer Assistance: Used RW with mod vc for safe use. Loss of balance.uncontrolled descent.        Exercise     Balance  static sitting WNL. S dynamic sitting. Min A static standing.   End of Session OT - End of Session Equipment Utilized During Treatment: Left knee immobilizer;Gait belt Activity Tolerance: Patient tolerated treatment well Patient left: in chair;with call bell/phone within reach;with family/visitor present Nurse Communication: Other (comment) (need for CIR consult)  GO     Pearlena Ow,HILLARY 01/15/2012, 12:35 PM

## 2012-01-15 NOTE — Progress Notes (Signed)
Bed offer received and accepted from [facility]. They will initiate [employer] authorization.  MD: Plan "dc when bed available" if medically stable and then CSW will proceed with placement.  FL2 on chart for your signature; scripts needed for any narcotics. Thanks!  Lorri Frederick. [place]  [phone]

## 2012-01-15 NOTE — Clinical Social Work Placement (Addendum)
    Clinical Social Work Department CLINICAL SOCIAL WORK PLACEMENT NOTE 01/15/2012  Patient:  [name], [name]  Account Number:  [id number] Admit date:  01/13/2012  Clinical Social Worker:  Lupita Leash Arita Severtson, BSW  Date/time:  01/14/2012 04:00 PM  Clinical Social Work is seeking post-discharge placement for this patient at the following level of care:   SKILLED NURSING   (*CSW will update this form in Epic as items are completed)   01/14/2012  Patient/family provided with [facility] Department of Clinical Social Work's list of facilities offering this level of care within the geographic area requested by the patient (or if unable, by the patient's family).  01/14/2012  Patient/family informed of their freedom to choose among providers that offer the needed level of care, that participate in Medicare, Medicaid or managed care program needed by the patient, have an available bed and are willing to accept the patient.  01/14/2012  Patient/family informed of [facility]' ownership interest in [facility], as well as of the fact that they are under no obligation to receive care at this facility.  PASARR submitted to EDS on 01/14/2012 PASARR number received from EDS on 01/14/2012  FL2 transmitted to all facilities in geographic area requested by pt/family on  01/14/2012 FL2 transmitted to all facilities within larger geographic area on   Patient informed that his/her managed care company has contracts with or will negotiate with  certain facilities, including the following:   [employer]     Patient/family informed of bed offers received: 01/15/2012  Patient chooses bed at  [facility]  Physician recommends and patient chooses bed at    Patient to be transferred to [facility] on   8/ Patient to be transferred to facility by Ambulance  Sharin Mons)  The following physician request were entered in Epic:   Additional Comments:

## 2012-01-15 NOTE — Progress Notes (Signed)
Physical Therapy Treatment Patient Details Name: [name] MRN: [id number] DOB: 1955/06/18 Today's Date: 01/15/2012 Time: 1660-6301 PT Time Calculation (min): 27 min  PT Assessment / Plan / Recommendation Comments on Treatment Session  Pt mobility improving.  Pt would benefit from CIR for rehab prior to return to home.     Follow Up Recommendations  Inpatient Rehab    Barriers to Discharge        Equipment Recommendations  3 in 1 bedside comode;Rolling walker with 5" wheels;Tub/shower bench;Wheelchair (measurements);Wheelchair cushion (measurements);Defer to next venue    Recommendations for Other Services Rehab consult  Frequency Min 5X/week   Plan Frequency remains appropriate;Equipment recommendations need to be updated;Discharge plan needs to be updated    Precautions / Restrictions Precautions Precautions: Fall Required Braces or Orthoses: Knee Immobilizer - Left Restrictions LLE Weight Bearing: Non weight bearing   Pertinent Vitals/Pain No c/o pain    Mobility  Bed Mobility Bed Mobility: Sit to Supine;Supine to Sit;Rolling Right;Rolling Left Rolling Right: 6: Modified independent (Device/Increase time) Rolling Left: 6: Modified independent (Device/Increase time);With rail Supine to Sit: 7: Independent Sitting - Scoot to Edge of Bed: 7: Independent Sit to Supine: 7: Independent Transfers Transfers: Sit to Stand;Stand to [employer] Transfers Sit to Stand: 4: Min assist;With upper extremity assist;From chair/3-in-1 Stand to Sit: 4: Min guard;With upper extremity assist;To bed Stand Pivot Transfers: 4: Min assist Details for Transfer Assistance: Used RW with mod vc for safe use. Loss of balance.uncontrolled descent.  Ambulation/Gait Ambulation/Gait Assistance: 4: Min assist Ambulation Distance (Feet): 10 Feet Assistive device: Rolling walker Ambulation/Gait Assistance Details: Pt required assistance to prevent falling when changing direction.  Instructed  pt on proper use of RW.   Gait Pattern: Step-to pattern;Trunk flexed Gait velocity: decreased Stairs: No Wheelchair Mobility Wheelchair Mobility: No    Exercises Total Joint Exercises Straight Leg Raises: 10 reps;Supine   PT Diagnosis:    PT Problem List:   PT Treatment Interventions:     PT Goals Acute Rehab PT Goals PT Goal Formulation: With patient Time For Goal Achievement: 01/21/12 Potential to Achieve Goals: Good Pt will go Supine/Side to Sit: with modified independence PT Goal: Supine/Side to Sit - Progress: Progressing toward goal Pt will go Sit to Supine/Side: with modified independence PT Goal: Sit to Supine/Side - Progress: Progressing toward goal Pt will go Sit to Stand: with modified independence PT Goal: Sit to Stand - Progress: Progressing toward goal Pt will go Stand to Sit: with modified independence PT Goal: Stand to Sit - Progress: Progressing toward goal Pt will Transfer Bed to Chair/Chair to Bed: with modified independence PT Transfer Goal: Bed to Chair/Chair to Bed - Progress: Progressing toward goal Pt will Ambulate: 1 - 15 feet;with min assist;with rolling walker PT Goal: Ambulate - Progress: Progressing toward goal  Visit Information  Last PT Received On: 01/15/12 Assistance Needed: +1    Subjective Data  Subjective: I feel like my toes are there Patient Stated Goal: Return to home.    Cognition  Overall Cognitive Status: Appears within functional limits for tasks assessed/performed Arousal/Alertness: Awake/alert Orientation Level: Appears intact for tasks assessed Behavior During Session: [facility] for tasks performed    Balance  Balance Balance Assessed: Yes Static Standing Balance Static Standing - Balance Support: Bilateral upper extremity supported Static Standing - Level of Assistance: 3: Mod assist Static Standing - Comment/# of Minutes: less than 30 seconds pt with A-P swiay and lob postieriorly x 2  End of Session PT - End of  Session Equipment Utilized During Treatment: Gait belt;Left knee immobilizer Activity Tolerance: Patient tolerated treatment well Patient left: in bed;with call bell/phone within reach Nurse Communication: Mobility status   GP     Magin Balbi 01/15/2012, 3:17 PM Alexa Golebiewski L. Tinnie Kunin DPT [phone]

## 2012-01-15 NOTE — Consult Note (Signed)
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 Victorino Dike   HPI: [name] is a 56 y.o. 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 Victorino Dike.  Post op has had problems with pain control. PT evaluation done today. MD, PT recommending CIR The patient states that she feels better today but she feels some itching in her left foot Review of Systems  Eyes: Negative for blurred vision and double vision.  Respiratory: Negative for shortness of breath.   Cardiovascular: Negative for chest pain and palpitations.  Gastrointestinal: Negative for heartburn and abdominal pain.  Genitourinary: Negative for dysuria and frequency.  Musculoskeletal: Negative for back pain.  Neurological: Positive for dizziness. Negative for headaches.   Past Medical History  Diagnosis Date  . Coronary atherosclerosis of native coronary artery     non obstructive by cath in 2008 and 2010  . Unspecified essential hypertension   . Other and unspecified hyperlipidemia   . Syncope and collapse     near-syncopal episode in November/2008  . Unspecified cardiovascular disease     Decreased left-ventricular fxn  . 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 Unspecified hereditary and idiopathic peripheral neuropathy   . Obesity, unspecified   . Esophageal reflux   . History of diabetes mellitus, type II   . Diaphragmatic hernia without mention of obstruction or gangrene   . Irritable bowel syndrome   . Morbid obesity   . Diabetes mellitus   . Cellulitis     left foot  . Anginal pain   . Stroke     2013-'no residual'   Past Surgical History  Procedure Date  . Invasive electrophysiologic study 5/09    followed by insertion of an implantable loop recorder. S/p removal   . Colonoscopy   . Cholecystectomy   . Multiple  toe surgeries   . Cardiac catheterization     LAD 30%, circumflex 50%, OM 75%, RI 60% with small branch 80%, dominant RCA 60%, EF 45-50%  . I&d extremity 12/23/2011    Procedure: IRRIGATION AND DEBRIDEMENT EXTREMITY;  Surgeon: Verlee Rossetti, MD;  Location: [facility] OR;  Service: Orthopedics;  Laterality: Left;  Left Foot  . I&d extremity 12/26/2011    Procedure: IRRIGATION AND DEBRIDEMENT EXTREMITY;  Surgeon: Toni Arthurs, MD;  Location: [facility] OR;  Service: Orthopedics;  Laterality: Left;  LEFT FOOT I&D WITH POSSIBLE WOUND VAC APPLICATION, POSSIBLE LEFT FIRST RAY AMPUTATION  . Amputation 12/30/2011    Procedure: AMPUTATION RAY;  Surgeon: Toni Arthurs, MD;  Location: [name] OR;  Service: Orthopedics;  Laterality: Left;  LEFT FIRST RAY AMPUTATION  . Eye surgery     cataract   Family History  Problem Relation Age of Onset  . Pneumonia Mother   . Gallbladder disease Mother     cancer  . Heart failure Mother   . Diabetes Father   . Coronary artery disease Father   . Stroke Neg Hx    Social History: lives alone.  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 She  reports that she has been smoking Cigarettes-2-3/day.   She has a 10 pack-year smoking history. She has never used smokeless tobacco. She reports that she drinks about 2.4 ounces of alcohol per week. She reports that she does not use illicit drugs. Nephew can check in prn past discharge.  Allergies  Allergen Reactions  . Banana     Face puffs up  . Penicillins Itching     edema  . Strawberry     Face puffs up   Medications Prior to Admission  Medication Sig Dispense Refill  . clopidogrel (PLAVIX) 75 MG tablet Take 1 tablet (75 mg total) by mouth daily with breakfast.  30 tablet  3  . glimepiride (AMARYL) 4 MG tablet Take 4 mg by mouth daily.       Marland Kitchen HYDROcodone-acetaminophen (NORCO/VICODIN) 5-325 MG per tablet Take 1 tablet by mouth every 4 (four) hours as needed. For pain      . insulin aspart (NOVOLOG) 100 UNIT/ML injection Inject 6  Units into the skin 3 (three) times daily with meals.  1 vial  3  . insulin glargine (LANTUS) 100 UNIT/ML injection Inject 20 Units into the skin at bedtime.      Marland Kitchen lisinopril (PRINIVIL,ZESTRIL) 20 MG tablet Take 20 mg by mouth daily.      . metFORMIN (GLUCOPHAGE) 500 MG tablet Take 500 mg by mouth 2 (two) times daily with a meal.      . simvastatin (ZOCOR) 40 MG tablet Take 40 mg by mouth every evening.        Home: Home Living Lives With: Alone Available Help at Discharge: Family;Available PRN/intermittently Type of Home: House Home Access: Stairs to enter [employer] of Steps: 1 Home Layout: One level Bathroom Shower/Tub: Forensic scientist: Standard Home Adaptive Equipment: Walker - rolling;Straight cane;Bedside commode/3-in-1  Functional History: Prior Function Able to Take Stairs?: Yes Driving: Yes Vocation: Full time employment Functional Status:  Mobility:   Transfers Sit to Stand: 4: Min assist Stand to Sit: 4: Min assist Stand Pivot Transfers: 4: Min assist (with RW) Ambulation/Gait Ambulation/Gait Assistance: Not tested (comment) (fatugued after transfer and would require +2 for safety )    ADL:    Cognition: Cognition Orientation Level: Oriented X4 Cognition Overall Cognitive Status: Appears within functional limits for tasks assessed/performed  Blood pressure 167/72, pulse 99, temperature 98 F (36.7 C), temperature source Oral, resp. rate 20, SpO2 98.00%. Physical Exam  Nursing note and vitals reviewed. Constitutional: She is oriented to person, place, and time. She appears well-developed and well-nourished.  HENT:  Head: Normocephalic and atraumatic.  Eyes: Pupils are equal, round, and reactive to light.  Neck: Normal range of motion. Neck supple.  Cardiovascular: Normal rate and regular rhythm.   Pulmonary/Chest: Effort normal and breath sounds normal.  Abdominal: Soft. Bowel sounds are normal.  Musculoskeletal:        Left BKA with compressive dressing.   Neurological: She is alert and oriented to person, place, and time.  Skin: Skin is warm.  upper extremity strength is 5/5 in bilateral deltoid, biceps, triceps, grip Left lower extremity strength is 4 at the hip flexor remainder is not tested due to BKA Right lower extremity is 4/5 in the hip flexor knee extensor ankle dorsiflexor plantar flexor Sensation is intact to light touch but diminished to proprioception in the right great toe  Results for orders placed during the hospital encounter of 01/13/12 (from the past 24 hour(s))  GLUCOSE, CAPILLARY     Status: Abnormal   Collection Time   01/14/12 12:26 PM      Component Value Range   Glucose-Capillary 230 (*) 70 - 99 mg/dL   Comment 1 Documented in Chart     Comment 2 Notify RN    GLUCOSE, CAPILLARY  Status: Abnormal   Collection Time   01/14/12  4:50 PM      Component Value Range   Glucose-Capillary 186 (*) 70 - 99 mg/dL   Comment 1 Documented in Chart     Comment 2 Notify RN    GLUCOSE, CAPILLARY     Status: Normal   Collection Time   01/14/12  9:36 PM      Component Value Range   Glucose-Capillary 98  70 - 99 mg/dL   Comment 1 Notify RN    BASIC METABOLIC PANEL     Status: Abnormal   Collection Time   01/15/12  5:55 AM      Component Value Range   Sodium 139  135 - 145 mEq/L   Potassium 3.3 (*) 3.5 - 5.1 mEq/L   Chloride 103  96 - 112 mEq/L   CO2 26  19 - 32 mEq/L   Glucose, Bld 70  70 - 99 mg/dL   BUN 14  6 - 23 mg/dL   Creatinine, Ser 1.61  0.50 - 1.10 mg/dL   Calcium 8.7  8.4 - 09.6 mg/dL   GFR calc non Af Amer 64 (*) >90 mL/min   GFR calc Af Amer 75 (*) >90 mL/min  GLUCOSE, CAPILLARY     Status: Normal   Collection Time   01/15/12  7:07 AM      Component Value Range   Glucose-Capillary 73  70 - 99 mg/dL   No results found.  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 Does the need for close, 24 hr/day medical  supervision in concert with the patient's rehab needs make it unreasonable for this patient to be served in a less intensive setting? Yes 2. Co-Morbidities requiring supervision/potential complications: hypertension, diabetes, history of syncope,obesity 3. Due to bowel management, safety, skin/wound care, disease management, medication administration, pain management and patient education, does the patient require 24 hr/day rehab nursing? Yes 4. Does the patient require coordinated care of a physician, rehab nurse, PT (1-2 hrs/day, 5 days/week) and OT (1-2 hrs/day, 5 days/week) to address physical and functional deficits in the context of the above medical diagnosis(es)? Yes Addressing deficits in the following areas: balance, endurance, locomotion, strength, transferring, bowel/bladder control, bathing, dressing and toileting 5. Can the patient actively participate in an intensive therapy program of at least 3 hrs of therapy per day at least 5 days per week? Yes 6. The potential for patient to make measurable gains while on inpatient rehab is excellent 7. Anticipated functional outcomes upon discharge from inpatient rehab are off and independent mobility with PT, modified independent ADL with OT, not applicable with SLP. 8. Estimated rehab length of stay to reach the above functional goals is: one week 9. Does the patient have adequate social supports to accommodate these discharge functional goals? Yes 10. Anticipated D/C setting: Home 11. Anticipated post D/C treatments: HH therapy 12. Overall Rehab/Functional Prognosis: excellent  RECOMMENDATIONS: This patient's condition is appropriate for continued rehabilitative care in the following setting: CIR Patient has agreed to participate in recommended program. Yes Note that insurance prior authorization may be required for reimbursement for recommended care.  Comment:    01/15/2012

## 2012-01-16 ENCOUNTER — Inpatient Hospital Stay (HOSPITAL_COMMUNITY): Payer: Managed Care, Other (non HMO) | Admitting: *Deleted

## 2012-01-16 ENCOUNTER — Inpatient Hospital Stay (HOSPITAL_COMMUNITY)
Admission: RE | Admit: 2012-01-16 | Discharge: 2012-01-27 | DRG: 945 | Disposition: A | Discharge status: Home Health | Payer: Managed Care, Other (non HMO) | Source: Ambulatory Visit | Attending: Physical Medicine & Rehabilitation | Admitting: Physical Medicine & Rehabilitation

## 2012-01-16 DIAGNOSIS — I2510 Atherosclerotic heart disease of native coronary artery without angina pectoris: Secondary | ICD-10-CM

## 2012-01-16 DIAGNOSIS — D62 Acute posthemorrhagic anemia: Secondary | ICD-10-CM

## 2012-01-16 DIAGNOSIS — E876 Hypokalemia: Secondary | ICD-10-CM

## 2012-01-16 DIAGNOSIS — Z8673 Personal history of transient ischemic attack (TIA), and cerebral infarction without residual deficits: Secondary | ICD-10-CM

## 2012-01-16 DIAGNOSIS — L98499 Non-pressure chronic ulcer of skin of other sites with unspecified severity: Secondary | ICD-10-CM

## 2012-01-16 DIAGNOSIS — Z5189 Encounter for other specified aftercare: Secondary | ICD-10-CM

## 2012-01-16 DIAGNOSIS — I10 Essential (primary) hypertension: Secondary | ICD-10-CM | POA: Diagnosis present

## 2012-01-16 DIAGNOSIS — Z91018 Allergy to other foods: Secondary | ICD-10-CM

## 2012-01-16 DIAGNOSIS — I739 Peripheral vascular disease, unspecified: Secondary | ICD-10-CM

## 2012-01-16 DIAGNOSIS — K449 Diaphragmatic hernia without obstruction or gangrene: Secondary | ICD-10-CM

## 2012-01-16 DIAGNOSIS — E785 Hyperlipidemia, unspecified: Secondary | ICD-10-CM

## 2012-01-16 DIAGNOSIS — E1140 Type 2 diabetes mellitus with diabetic neuropathy, unspecified: Secondary | ICD-10-CM | POA: Diagnosis present

## 2012-01-16 DIAGNOSIS — S88119A Complete traumatic amputation at level between knee and ankle, unspecified lower leg, initial encounter: Secondary | ICD-10-CM

## 2012-01-16 DIAGNOSIS — E1159 Type 2 diabetes mellitus with other circulatory complications: Secondary | ICD-10-CM

## 2012-01-16 DIAGNOSIS — K219 Gastro-esophageal reflux disease without esophagitis: Secondary | ICD-10-CM

## 2012-01-16 DIAGNOSIS — K5900 Constipation, unspecified: Secondary | ICD-10-CM

## 2012-01-16 DIAGNOSIS — E669 Obesity, unspecified: Secondary | ICD-10-CM | POA: Diagnosis present

## 2012-01-16 DIAGNOSIS — I96 Gangrene, not elsewhere classified: Secondary | ICD-10-CM

## 2012-01-16 DIAGNOSIS — N390 Urinary tract infection, site not specified: Secondary | ICD-10-CM

## 2012-01-16 DIAGNOSIS — Z8249 Family history of ischemic heart disease and other diseases of the circulatory system: Secondary | ICD-10-CM

## 2012-01-16 DIAGNOSIS — K589 Irritable bowel syndrome without diarrhea: Secondary | ICD-10-CM

## 2012-01-16 DIAGNOSIS — IMO0002 Reserved for concepts with insufficient information to code with codable children: Secondary | ICD-10-CM | POA: Diagnosis present

## 2012-01-16 DIAGNOSIS — G609 Hereditary and idiopathic neuropathy, unspecified: Secondary | ICD-10-CM | POA: Diagnosis present

## 2012-01-16 DIAGNOSIS — Z880 Allergy status to penicillin: Secondary | ICD-10-CM

## 2012-01-16 DIAGNOSIS — Z833 Family history of diabetes mellitus: Secondary | ICD-10-CM

## 2012-01-16 DIAGNOSIS — E1165 Type 2 diabetes mellitus with hyperglycemia: Secondary | ICD-10-CM

## 2012-01-16 LAB — BASIC METABOLIC PANEL
Calcium: 8.3 mg/dL — ABNORMAL LOW (ref 8.4–10.5)
Creatinine, Ser: 1 mg/dL (ref 0.50–1.10)
GFR calc Af Amer: 71 mL/min — ABNORMAL LOW (ref >90)
GFR calc non Af Amer: 61 mL/min — ABNORMAL LOW (ref >90)
Sodium: 138 mEq/L (ref 135–145)

## 2012-01-16 LAB — GLUCOSE, CAPILLARY
Glucose-Capillary: 116 mg/dL — ABNORMAL HIGH (ref 70–99)
Glucose-Capillary: 106 mg/dL — ABNORMAL HIGH (ref 70–99)

## 2012-01-16 MED ORDER — TRAMADOL HCL 50 MG PO TABS
50.0000 mg | ORAL_TABLET | Freq: Four times a day (QID) | ORAL | Status: DC | PRN
  Administered 2012-01-24 – 2012-01-26 (×4): 50 mg via ORAL
  Filled 2012-01-16 (×6): qty 1

## 2012-01-16 MED ORDER — SENNOSIDES-DOCUSATE SODIUM 8.6-50 MG PO TABS
2.0000 | ORAL_TABLET | Freq: Every day | ORAL | Status: DC
  Administered 2012-01-16 – 2012-01-25 (×8): 2 via ORAL
  Filled 2012-01-16 (×16): qty 2

## 2012-01-16 MED ORDER — FLEET ENEMA 7-19 GM/118ML RE ENEM
1.0000 | ENEMA | Freq: Once | RECTAL | Status: AC | PRN
  Filled 2012-01-16: qty 1

## 2012-01-16 MED ORDER — METHOCARBAMOL 500 MG PO TABS: 500.0000 mg | ORAL_TABLET | Freq: Four times a day (QID) | ORAL | Status: DC | PRN

## 2012-01-16 MED ORDER — ENOXAPARIN SODIUM 40 MG/0.4ML ~~LOC~~ SOLN: 40.0000 mg | SUBCUTANEOUS | Status: DC

## 2012-01-16 MED ORDER — MORPHINE SULFATE 15 MG PO TABS
15.0000 mg | ORAL_TABLET | ORAL | Status: DC | PRN
  Administered 2012-01-16 – 2012-01-27 (×24): 15 mg via ORAL
  Filled 2012-01-16 (×25): qty 1

## 2012-01-16 MED ORDER — INSULIN ASPART 100 UNIT/ML ~~LOC~~ SOLN
0.0000 [IU] | Freq: Three times a day (TID) | SUBCUTANEOUS | Status: DC
  Administered 2012-01-20 – 2012-01-22 (×3): 1 [IU] via SUBCUTANEOUS
  Administered 2012-01-22 – 2012-01-23 (×2): 2 [IU] via SUBCUTANEOUS
  Administered 2012-01-24 – 2012-01-25 (×4): 1 [IU] via SUBCUTANEOUS
  Administered 2012-01-26: 2 [IU] via SUBCUTANEOUS

## 2012-01-16 MED ORDER — DSS 100 MG PO CAPS: 100.0000 mg | ORAL_CAPSULE | Freq: Two times a day (BID) | ORAL | Status: AC

## 2012-01-16 MED ORDER — ENOXAPARIN SODIUM 40 MG/0.4ML ~~LOC~~ SOLN
40.0000 mg | SUBCUTANEOUS | Status: DC
  Administered 2012-01-17 – 2012-01-26 (×10): 40 mg via SUBCUTANEOUS
  Filled 2012-01-16 (×11): qty 0.4

## 2012-01-16 MED ORDER — SIMVASTATIN 40 MG PO TABS
40.0000 mg | ORAL_TABLET | Freq: Every evening | ORAL | Status: DC
  Administered 2012-01-16 – 2012-01-26 (×11): 40 mg via ORAL
  Filled 2012-01-16 (×12): qty 1

## 2012-01-16 MED ORDER — MORPHINE SULFATE ER 15 MG PO TBCR
15.0000 mg | EXTENDED_RELEASE_TABLET | Freq: Two times a day (BID) | ORAL | Status: DC
  Administered 2012-01-16 – 2012-01-27 (×22): 15 mg via ORAL
  Filled 2012-01-16 (×22): qty 1

## 2012-01-16 MED ORDER — OXYCODONE HCL 5 MG PO TABS
5.0000 mg | ORAL_TABLET | ORAL | Status: DC | PRN
  Administered 2012-01-16 (×2): 10 mg via ORAL
  Filled 2012-01-16 (×2): qty 2

## 2012-01-16 MED ORDER — GUAIFENESIN-DM 100-10 MG/5ML PO SYRP: 5.0000 mL | ORAL_SOLUTION | Freq: Four times a day (QID) | ORAL | Status: DC | PRN

## 2012-01-16 MED ORDER — CLOPIDOGREL BISULFATE 75 MG PO TABS
75.0000 mg | ORAL_TABLET | Freq: Every day | ORAL | Status: DC
  Administered 2012-01-17 – 2012-01-27 (×11): 75 mg via ORAL
  Filled 2012-01-16 (×12): qty 1

## 2012-01-16 MED ORDER — INSULIN ASPART 100 UNIT/ML ~~LOC~~ SOLN: 0.0000 [IU] | Freq: Every day | SUBCUTANEOUS | Status: DC

## 2012-01-16 MED ORDER — BISACODYL 10 MG RE SUPP
10.0000 mg | Freq: Every day | RECTAL | Status: DC | PRN
  Administered 2012-01-18: 10 mg via RECTAL
  Filled 2012-01-16: qty 1

## 2012-01-16 MED ORDER — GLIMEPIRIDE 4 MG PO TABS
4.0000 mg | ORAL_TABLET | Freq: Every day | ORAL | Status: DC
  Administered 2012-01-17 – 2012-01-27 (×11): 4 mg via ORAL
  Filled 2012-01-16 (×13): qty 1

## 2012-01-16 MED ORDER — PNEUMOCOCCAL 13-VAL CONJ VACC IM SUSP
0.5000 mL | INTRAMUSCULAR | Status: DC
  Filled 2012-01-16: qty 0.5

## 2012-01-16 MED ORDER — LISINOPRIL 20 MG PO TABS
20.0000 mg | ORAL_TABLET | Freq: Every day | ORAL | Status: DC
  Administered 2012-01-17 – 2012-01-27 (×11): 20 mg via ORAL
  Filled 2012-01-16 (×13): qty 1

## 2012-01-16 MED ORDER — POLYSACCHARIDE IRON COMPLEX 150 MG PO CAPS
150.0000 mg | ORAL_CAPSULE | Freq: Every day | ORAL | Status: DC
  Administered 2012-01-16 – 2012-01-27 (×12): 150 mg via ORAL
  Filled 2012-01-16 (×13): qty 1

## 2012-01-16 MED ORDER — MORPHINE SULFATE ER 15 MG PO TBCR: 15.0000 mg | EXTENDED_RELEASE_TABLET | Freq: Two times a day (BID) | ORAL | Status: DC

## 2012-01-16 MED ORDER — INSULIN ASPART 100 UNIT/ML ~~LOC~~ SOLN
6.0000 [IU] | Freq: Three times a day (TID) | SUBCUTANEOUS | Status: DC
  Administered 2012-01-16 – 2012-01-19 (×7): 6 [IU] via SUBCUTANEOUS

## 2012-01-16 MED ORDER — ACETAMINOPHEN 325 MG PO TABS: 325.0000 mg | ORAL_TABLET | ORAL | Status: DC | PRN

## 2012-01-16 MED ORDER — INSULIN ASPART 100 UNIT/ML ~~LOC~~ SOLN: 6.0000 [IU] | Freq: Three times a day (TID) | SUBCUTANEOUS | Status: DC

## 2012-01-16 MED ORDER — HYDROCERIN EX CREA
TOPICAL_CREAM | Freq: Two times a day (BID) | CUTANEOUS | Status: DC
  Administered 2012-01-16 – 2012-01-26 (×21): via TOPICAL
  Filled 2012-01-16 (×2): qty 113

## 2012-01-16 MED ORDER — SENNA 8.6 MG PO TABS: 2.0000 | ORAL_TABLET | Freq: Two times a day (BID) | ORAL | Status: DC

## 2012-01-16 MED ORDER — OXYCODONE HCL 5 MG PO TABS
10.0000 mg | ORAL_TABLET | ORAL | Status: DC | PRN
  Administered 2012-01-16: 10 mg via ORAL
  Filled 2012-01-16: qty 2

## 2012-01-16 MED ORDER — POTASSIUM CHLORIDE CRYS ER 10 MEQ PO TBCR
10.0000 meq | EXTENDED_RELEASE_TABLET | Freq: Two times a day (BID) | ORAL | Status: AC
  Administered 2012-01-16 – 2012-01-19 (×6): 10 meq via ORAL
  Filled 2012-01-16 (×6): qty 1

## 2012-01-16 MED ORDER — INSULIN GLARGINE 100 UNIT/ML ~~LOC~~ SOLN
20.0000 [IU] | Freq: Every day | SUBCUTANEOUS | Status: DC
  Administered 2012-01-16: 20 [IU] via SUBCUTANEOUS

## 2012-01-16 MED ORDER — PROCHLORPERAZINE 25 MG RE SUPP
12.5000 mg | Freq: Four times a day (QID) | RECTAL | Status: DC | PRN
  Filled 2012-01-16: qty 1

## 2012-01-16 MED ORDER — PROCHLORPERAZINE MALEATE 5 MG PO TABS
5.0000 mg | ORAL_TABLET | Freq: Four times a day (QID) | ORAL | Status: DC | PRN
  Filled 2012-01-16: qty 2

## 2012-01-16 MED ORDER — TRAZODONE HCL 50 MG PO TABS: 25.0000 mg | ORAL_TABLET | Freq: Every evening | ORAL | Status: DC | PRN

## 2012-01-16 MED ORDER — PROCHLORPERAZINE EDISYLATE 5 MG/ML IJ SOLN
5.0000 mg | Freq: Four times a day (QID) | INTRAMUSCULAR | Status: DC | PRN
  Filled 2012-01-16: qty 2

## 2012-01-16 MED ORDER — OXYCODONE HCL 5 MG PO TABS: 5.0000 mg | ORAL_TABLET | ORAL | Status: AC | PRN

## 2012-01-16 MED ORDER — ALUM & MAG HYDROXIDE-SIMETH 200-200-20 MG/5ML PO SUSP: 30.0000 mL | ORAL | Status: DC | PRN

## 2012-01-16 MED ORDER — METFORMIN HCL 500 MG PO TABS
500.0000 mg | ORAL_TABLET | Freq: Two times a day (BID) | ORAL | Status: DC
  Administered 2012-01-16 – 2012-01-20 (×8): 500 mg via ORAL
  Filled 2012-01-16 (×12): qty 1

## 2012-01-16 MED ORDER — INSULIN ASPART 100 UNIT/ML ~~LOC~~ SOLN: 0.0000 [IU] | Freq: Three times a day (TID) | SUBCUTANEOUS | Status: DC

## 2012-01-16 NOTE — Progress Notes (Signed)
Noted Lantus 20 units held last night by RN.  Fasting blood sugar elevated as a result today.  May want to add Novolog Sensitive SSI to patient's regimen.  Will follow. Ambrose Finland RN, MSN, CDE Diabetes Coordinator Inpatient Diabetes Program [phone]

## 2012-01-16 NOTE — Evaluation (Signed)
Occupational Therapy Assessment and Plan  Patient Details  Name: [name] MRN: [id number] Date of Birth: 1955-09-20  OT Diagnosis: Left BKA Rehab Potential: Rehab Potential: Good ELOS: 7-10 days   Today's Date: 01/16/2012 Time: 1310-1330 Time Calculation (min): 20 min  Problem List:  Patient Active Problem List  Diagnosis  . HYPERLIPIDEMIA  . OBESITY  . PERIPHERAL NEUROPATHY  . HYPERTENSION  . CAD, NATIVE VESSEL  . LEFT VENTRICULAR FUNCTION, DECREASED  . CVA (cerebral infarction)  . GERD  . HIATAL HERNIA  . Irritable bowel syndrome  . SYNCOPE  . DM type 2, uncontrolled, with neuropathy  . Syncope  . Hypokalemia  . HTN (hypertension)  . UTI (lower urinary tract infection)  . H/O: CVA (cardiovascular accident)  . Normocytic anemia  . Facial droop  . Hyperglycemia  . Mild dehydration  . Numbness  . TIA (transient ischemic attack)  . Candiduria  . Paresthesia of right arm and leg  . Cellulitis of left lower extremity  . Diabetic ulcer of left foot  . Hypoglycemia associated with diabetes  . Chest pain  . Dizziness    Past Medical History:  Past Medical History  Diagnosis Date  . Coronary atherosclerosis of native coronary artery     non obstructive by cath in 2008 and 2010  . Unspecified essential hypertension   . Other and unspecified hyperlipidemia   . Syncope and collapse     near-syncopal episode in November/2008  . Unspecified cardiovascular disease     Decreased left-ventricular fxn  . 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 Unspecified hereditary and idiopathic peripheral neuropathy   . Obesity, unspecified   . Esophageal reflux   . History of diabetes mellitus, type II   . Diaphragmatic hernia without mention of obstruction or gangrene   . Irritable bowel syndrome   . Morbid obesity   . Diabetes mellitus   . Cellulitis     left foot  . Anginal pain   . Stroke     2013-'no residual'   Past  Surgical History:  Past Surgical History  Procedure Date  . Invasive electrophysiologic study 5/09    followed by insertion of an implantable loop recorder. S/p removal   . Colonoscopy   . Cholecystectomy   . Multiple toe surgeries   . Cardiac catheterization     LAD 30%, circumflex 50%, OM 75%, RI 60% with small branch 80%, dominant RCA 60%, EF 45-50%  . I&d extremity 12/23/2011    Procedure: IRRIGATION AND DEBRIDEMENT EXTREMITY;  Surgeon: Verlee Rossetti, MD;  Location: [facility] OR;  Service: Orthopedics;  Laterality: Left;  Left Foot  . I&d extremity 12/26/2011    Procedure: IRRIGATION AND DEBRIDEMENT EXTREMITY;  Surgeon: Toni Arthurs, MD;  Location: [facility] OR;  Service: Orthopedics;  Laterality: Left;  LEFT FOOT I&D WITH POSSIBLE WOUND VAC APPLICATION, POSSIBLE LEFT FIRST RAY AMPUTATION  . Amputation 12/30/2011    Procedure: AMPUTATION RAY;  Surgeon: Toni Arthurs, MD;  Location: [facility] OR;  Service: Orthopedics;  Laterality: Left;  LEFT FIRST RAY AMPUTATION  . Eye surgery     cataract  . Amputation 01/13/2012    Procedure: AMPUTATION BELOW KNEE;  Surgeon: Toni Arthurs, MD;  Location: [facility] OR;  Service: Orthopedics;  Laterality: Left;    Assessment & Plan Clinical Impression: Patient is a 56 y.o. 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 Patient elected to undergo L-BKA  on 01/14/12 by Dr. Victorino Dike. Post op has had problems with pain control. Patient transferred to CIR on 01/16/2012 .    Patient currently requires min with basic self-care skills secondary to muscle weakness.  Prior to hospitalization, patient could complete BADL and IADL at [facility] [place].  Patient will benefit from skilled intervention to increase independence with basic self-care skills prior to discharge home at Mod I level with family available to assist if needed..  Anticipate patient will require intermittent supervision and no further OT follow recommended.  OT Assessment Rehab  Potential: Good OT Plan OT Frequency: 1-2 X/day, 60-90 minutes Estimated Length of Stay: 7-10 days OT Treatment/Interventions: Balance/vestibular training;Discharge planning;Community reintegration;Functional mobility training;Therapeutic Activities;Self Care/advanced ADL retraining;Patient/family education;DME/adaptive equipment instruction;Therapeutic Exercise;UE/LE Strength taining/ROM OT Recommendation Follow Up Recommendations: Other (comment) (TBD) Equipment Recommended: Other (comment) (TBD)  OT Evaluation Precautions/Restrictions  Precautions Precautions: Fall Required Braces or Orthoses: Other Brace/Splint Other Brace/Splint: Pt was given knee immobilizer. No orders stating what leg or wearing schedule. Pain Pain Assessment Pain Assessment: 0-10 Pain Score:   8 Pain Type: Surgical pain Pain Location: Leg Pain Orientation: Left Pain Descriptors: Aching Pain Onset: Gradual Pain Intervention(s): Medication (See eMAR) Home Living/Prior Functioning Home Living Lives With: Alone Available Help at Discharge: Other (Comment) (niece, nephew and sister daily assist)) Type of Home: House Home Access: Stairs to enter Secretary/administrator of Steps: 1 Home Layout: One level Bathroom Shower/Tub: Forensic scientist: Standard Bathroom Accessibility: Yes How Accessible: Accessible via wheelchair Home Adaptive Equipment: Straight cane;Walker - rolling;Bedside commode/3-in-1 Prior Function Level of Independence: Independent with basic ADLs;Independent with transfers;Requires assistive device for independence Able to Take Stairs?: Yes Driving:  (car not driveable right now. 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[employer]) Vision/Perception  Vision - History Baseline Vision: Wears glasses only for reading Patient Visual Report: No change from baseline Vision - Assessment Eye Alignment: Within Functional Limits    Cognition Overall Cognitive Status: Appears within functional limits for tasks assessed Arousal/Alertness: Awake/alert Orientation Level: Oriented X4 Sensation Sensation Light Touch: Appears Intact Stereognosis: Appears Intact Hot/Cold: Appears Intact Proprioception: Appears Intact Coordination Gross Motor Movements are Fluid and Coordinated: Yes Fine Motor Movements are Fluid and Coordinated: Yes Motor  Motor Motor: Within Functional Limits Mobility  Transfers Sit to Stand: 4: Min assist;With upper extremity assist;With armrests;From chair/3-in-1 Stand to Sit: 4: Min assist;With armrests;To chair/3-in-1  Extremity/Trunk Assessment RUE Assessment RUE Assessment: Exceptions to [facility] RUE AROM (degrees) Overall AROM Right Upper Extremity: Within functional limits for tasks performed RUE Strength RUE Overall Strength: Within Functional Limits for tasks performed (4/5: MMT) LUE Assessment LUE Assessment: Exceptions to WFL LUE AROM (degrees) Overall AROM Left Upper Extremity: Within functional limits for tasks assessed LUE Strength LUE Overall Strength: Within Functional Limits for tasks assessed (4/5:MMT)  See FIM for current functional status Refer to Care Plan for Long Term Goals  Skilled Therapeutic Interventions/Progress Updates:  OT eval initiated this date. Patient was at Mod I level prior to amputation. Patient's ADL will be evaled tomorrow. Sit to stand performed with therapist with standard walker at [facility] Assist. Anticipate short rehab stay with LTG of Mod I. If needed LTGs can be adjusted.  Recommendations for other services: None  Discharge Criteria: Patient will be discharged from OT if patient refuses treatment 3 consecutive times without medical reason, if treatment goals not met, if there is a change in medical status, if patient makes no progress towards goals or if patient is  discharged from hospital.  The above assessment, treatment plan, treatment alternatives  and goals were discussed and mutually agreed upon: by patient  Limmie Patricia, OTR/L 01/16/2012, 1:36 PM

## 2012-01-16 NOTE — Plan of Care (Signed)
Problem: Discharge Progression Outcomes Goal: Barriers To Progression Addressed/Resolved Outcome: Progressing Pt to transfer to inpt rehab unit

## 2012-01-16 NOTE — Progress Notes (Signed)
I have begun precertification with [employer] for hopeful admit to CIR today. [phone]

## 2012-01-16 NOTE — Progress Notes (Signed)
MD notified of Lantus 20units SQ QHS held d/t pt CBG 67 at dinner time.  Nsg to monitor for status changes.

## 2012-01-16 NOTE — Discharge Summary (Addendum)
Physician Discharge Summary  Patient ID: [name] MRN: [id number] DOB/AGE: [birth year] 56 y.o.  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  Discharged Condition: stable  Hospital Course: Pt was admitted and underwent L BKA on 8/27.  This surgery was necessary due to progression of the patient's gangrene and dehiscence of the previous 1st ray amputation site.  Progression of gangrene was likely the result of the patient's poorly controlled diabetes and peripheral vascular disease.  SHe tolreated the procedure well.  Her post op course was noteworthy for improving blood sugar control.  SNF MD should monitor and adjust diabetes meds as needed.  She will need PT and OT for ambulation.  Dressings can be changed starting tomorrow daily and as needed with dry gauze and a stump shrinker sock to hold the dressings in place.  Consults: PT, OT, SW and case mgmt.  Significant Diagnostic Studies: none  Treatments: surgery: as above  Discharge Exam: Blood pressure 154/76, pulse 91, temperature 98.7 F (37.1 C), temperature source Oral, resp. rate 20, SpO2 100.00%. wn wd woman in nad.  a and O x 4.  mood and affect normal. EOMI.  Resp unlabored.  L LE dressed and dry with knee immobiilizer in place.  Disposition: CIR or SNF  Discharge Orders    Future Orders Please Complete By Expires   Diet - low sodium heart healthy      Call MD / Call 911      Comments:   If you experience chest pain or shortness of breath, CALL 911 and be transported to the hospital emergency room.  If you develope a fever above 101 F, pus (white drainage) or increased drainage or redness at the wound, or calf pain, call your surgeon's office.   Constipation Prevention      Comments:   Drink plenty of fluids.  Prune juice may be helpful.  You may use a stool softener, such as Colace (over the counter) 100 mg twice a day.  Use MiraLax (over the counter) for constipation as needed.   Increase activity slowly as tolerated        Medication List  As of 01/16/2012  7:24 AM   STOP taking these medications         HYDROcodone-acetaminophen 5-325 MG per tablet         TAKE these medications         clopidogrel 75 MG tablet   Commonly known as: PLAVIX   Take 1 tablet (75 mg total) by mouth daily with breakfast.      DSS 100 MG Caps   Take 100 mg by mouth 2 (two) times daily.      enoxaparin 40 MG/0.4ML injection   Commonly known as: LOVENOX   Inject 0.4 mLs (40 mg total) into the skin daily.      glimepiride 4 MG tablet   Commonly known as: AMARYL   Take 4 mg by mouth daily.      insulin aspart 100 UNIT/ML injection   Commonly known as: novoLOG   Inject 6 Units into the skin 3 (three) times daily with meals.      insulin glargine 100 UNIT/ML injection   Commonly known as: LANTUS   Inject 20 Units into the skin at bedtime.  lisinopril 20 MG tablet   Commonly known as: PRINIVIL,ZESTRIL   Take 20 mg by mouth daily.      metFORMIN 500 MG tablet   Commonly known as: GLUCOPHAGE   Take 500 mg by mouth 2 (two) times daily with a meal.      oxyCODONE 5 MG immediate release tablet   Commonly known as: Oxy IR/ROXICODONE   Take 1-3 tablets (5-15 mg total) by mouth every 4 (four) hours as needed for pain.      senna 8.6 MG Tabs   Commonly known as: SENOKOT   Take 2 tablets (17.2 mg total) by mouth 2 (two) times daily.      simvastatin 40 MG tablet   Commonly known as: ZOCOR   Take 40 mg by mouth every evening.           Follow-up Information    Follow up with Deitra Craine, Jonny Ruiz, MD. Schedule an appointment as soon as possible for a visit in 2 weeks.   Contact information:   [address], [address] [place]  [address] [phone]          Signed: Toni Arthurs 01/16/2012, 7:24 AM

## 2012-01-16 NOTE — Progress Notes (Signed)
Insurance has approved inpt rehab admission for today . Patient in agreement and we will arrange for admission. Please call me with any questions. [phone]

## 2012-01-16 NOTE — Progress Notes (Signed)
Utilization review completed. Dashae Wilcher, RN, BSN. 

## 2012-01-16 NOTE — H&P (Signed)
Physical Medicine and Rehabilitation Admission H&P    CC: L-BKA  HPI: [name] is a 56 y.o. 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 Victorino Dike. Post op has had problems with pain control. Therapy evaluations done and CIR recommended for progression.   Review of Systems  HENT: Negative for hearing loss.   Eyes: Negative for blurred vision and double vision.  Respiratory: Negative for cough and shortness of breath.   Cardiovascular: Negative for chest pain and palpitations.  Gastrointestinal: Negative for heartburn, nausea and constipation.  Genitourinary: Negative for urgency and frequency.  Musculoskeletal:       Poor post op pain control   Neurological: Negative for headaches.  Psychiatric/Behavioral: The patient is not nervous/anxious and does not have insomnia.    Past Medical History  Diagnosis Date  . Coronary atherosclerosis of native coronary artery     non obstructive by cath in 2008 and 2010  . Unspecified essential hypertension   . Other and unspecified hyperlipidemia   . Syncope and collapse     near-syncopal episode in November/2008  . Unspecified cardiovascular disease     Decreased left-ventricular fxn  . 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 Unspecified hereditary and idiopathic peripheral neuropathy   . Obesity, unspecified   . Esophageal reflux   . History of diabetes mellitus, type II   . Diaphragmatic hernia without mention of obstruction or gangrene   . Irritable bowel syndrome   . Morbid obesity   . Diabetes mellitus   . Cellulitis     left foot  . Anginal pain   . Stroke     2013-'no residual'   Past Surgical History  Procedure Date  . Invasive electrophysiologic study 5/09    followed by insertion of an implantable loop recorder. S/p removal   . Colonoscopy   . Cholecystectomy   . Multiple toe  surgeries   . Cardiac catheterization     LAD 30%, circumflex 50%, OM 75%, RI 60% with small branch 80%, dominant RCA 60%, EF 45-50%  . I&d extremity 12/23/2011    Procedure: IRRIGATION AND DEBRIDEMENT EXTREMITY;  Surgeon: Verlee Rossetti, MD;  Location: [facility] OR;  Service: Orthopedics;  Laterality: Left;  Left Foot  . I&d extremity 12/26/2011    Procedure: IRRIGATION AND DEBRIDEMENT EXTREMITY;  Surgeon: Toni Arthurs, MD;  Location: [facility] OR;  Service: Orthopedics;  Laterality: Left;  LEFT FOOT I&D WITH POSSIBLE WOUND VAC APPLICATION, POSSIBLE LEFT FIRST RAY AMPUTATION  . Amputation 12/30/2011    Procedure: AMPUTATION RAY;  Surgeon: Toni Arthurs, MD;  Location: [facility] OR;  Service: Orthopedics;  Laterality: Left;  LEFT FIRST RAY AMPUTATION  . Eye surgery     cataract  . Amputation 01/13/2012    Procedure: AMPUTATION BELOW KNEE;  Surgeon: Toni Arthurs, MD;  Location: [facility] OR;  Service: Orthopedics;  Laterality: Left;   Family History  Problem Relation Age of Onset  . Pneumonia Mother   . Gallbladder disease Mother     cancer  . Heart failure Mother   . Diabetes Father   . Coronary artery disease Father   . Stroke Neg Hx    Social History: lives alone. 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 She has a 10 pack-year smoking history. She has never used smokeless tobacco. She reports that she  drinks about 2.4 ounces of alcohol per week. She reports that she does not use illicit drugs. Nephew can check in prn past discharge.  Allergies  Allergen Reactions  . Banana     Face puffs up  . Penicillins Itching     edema  . Strawberry     Face puffs up   Medications Prior to Admission  Medication Sig Dispense Refill  . clopidogrel (PLAVIX) 75 MG tablet Take 1 tablet (75 mg total) by mouth daily with breakfast.  30 tablet  3  . docusate sodium 100 MG CAPS Take 100 mg by mouth 2 (two) times daily.  10 capsule    . enoxaparin (LOVENOX) 40 MG/0.4ML  injection Inject 0.4 mLs (40 mg total) into the skin daily.  12 Syringe  0  . glimepiride (AMARYL) 4 MG tablet Take 4 mg by mouth daily.       . insulin aspart (NOVOLOG) 100 UNIT/ML injection Inject 6 Units into the skin 3 (three) times daily with meals.  1 vial  3  . insulin glargine (LANTUS) 100 UNIT/ML injection Inject 20 Units into the skin at bedtime.      Marland Kitchen lisinopril (PRINIVIL,ZESTRIL) 20 MG tablet Take 20 mg by mouth daily.      . metFORMIN (GLUCOPHAGE) 500 MG tablet Take 500 mg by mouth 2 (two) times daily with a meal.      . oxyCODONE (OXY IR/ROXICODONE) 5 MG immediate release tablet Take 1-3 tablets (5-15 mg total) by mouth every 4 (four) hours as needed for pain.  50 tablet  0  . senna (SENOKOT) 8.6 MG TABS Take 2 tablets (17.2 mg total) by mouth 2 (two) times daily.  120 each    . simvastatin (ZOCOR) 40 MG tablet Take 40 mg by mouth every evening.        Home:     Functional History:    Functional Status:  Mobility:          ADL:    Cognition:       Blood pressure 161/84, temperature 97.7 F (36.5 C), temperature source Oral, resp. rate 18, SpO2 99.00%. Physical Exam  Nursing note and vitals reviewed. Constitutional: She appears well-developed and well-nourished.       Obese female, NAD  HENT:  Head: Normocephalic and atraumatic.  Eyes: Pupils are equal, round, and reactive to light.  Neck: Normal range of motion. Neck supple.  Cardiovascular: Normal rate and regular rhythm.   Pulmonary/Chest: Effort normal and breath sounds normal.  Abdominal: Soft. Bowel sounds are normal.  Musculoskeletal:       Dry skin right foot.  L-BKA with compressive post surgical dressing in place.  Neurological: She has normal reflexes.       Alert and oriented. Strength 5/5 in UE. Unable to lift left leg due to pain. Right leg grossly 3+ to 4/5. No focal sensory deficits.   Skin:       Wound dressed. Leg appropriately tender. Leg ACEd from thigh down.     Results for  orders placed during the hospital encounter of 01/16/12 (from the past 48 hour(s))  GLUCOSE, CAPILLARY     Status: Abnormal   Collection Time   01/16/12 12:14 PM      Component Value Range Comment   Glucose-Capillary 116 (*) 70 - 99 mg/dL    No results found.  Post Admission Physician Evaluation: 1. Functional deficits secondary  to left BKA. 2. Patient is admitted to receive collaborative, interdisciplinary care between the physiatrist,  rehab nursing staff, and therapy team. 3. Patient's level of medical complexity and substantial therapy needs in context of that medical necessity cannot be provided at a lesser intensity of care such as a SNF. 4. Patient has experienced substantial functional loss from his/her baseline which was documented above under the "Functional History" and "Functional Status" headings.  Judging by the patient's diagnosis, physical exam, and functional history, the patient has potential for functional progress which will result in measurable gains while on inpatient rehab.  These gains will be of substantial and practical use upon discharge  in facilitating mobility and self-care at the household level. 5. Physiatrist will provide 24 hour management of medical needs as well as oversight of the therapy plan/treatment and provide guidance as appropriate regarding the interaction of the two. 6. 24 hour rehab nursing will assist with bladder management, bowel management, safety, skin/wound care, disease management, medication administration, pain management and patient education  and help integrate therapy concepts, techniques,education, etc. 7. PT will assess and treat for:  fxnl mobility, pre-pros ed. Safety, pain mgt, lower extremity strength.  Goals are: mod I. 8. OT will assess and treat for: UES, fxnl mobility, ADL's, safety.   Goals are: mod I. 9. SLP will assess and treat for: n/a 10. Case Management and Social Worker will assess and treat for psychological issues and  discharge planning. 11. Team conference will be held weekly to assess progress toward goals and to determine barriers to discharge. 12. Patient will receive at least 3 hours of therapy per day at least 5 days per week. 13. ELOS: 7-10 days      Prognosis:  excellent   Medical Problem List and Plan: 1. DVT Prophylaxis/Anticoagulation: Pharmaceutical: Lovenox 2. Pain Management: reports  oxycodone ineffective.  Will start  MS contin for consistent pain relief with prn MSIR.   3. Mood: seems to have good outlook.  Will have LCSW follow up for formal evaluation. 4. Neuropsych: This patient is capable of making decisions on his/her own behalf. 5. DM type 2:  Monitor with AC/HSCBG checks. Will continue amaryl, metformin and lantus. Continue 6 units for meal coverage and titrate lantus as needed for tighter control.  6. Hypokalemia:  Likely dilutional.  Will supplement and recheck on Monday. 7. ABLA:  Add iron supplement.  Will recheck on Monday.  8. Wound: continue post-op dressing. Remove dressing over the next 2-3 days.  Ivory Broad, MD   01/16/2012,

## 2012-01-16 NOTE — Plan of Care (Addendum)
Overall Plan of Care [facility]) Patient Details Name: [name] MRN: [id number] DOB: [birth year]  Diagnosis:  Rehabilitation for left below-knee amputation  Primary Diagnosis:     Co-morbidities: peripheral vascular disease Coronary atherosclerosis of native coronary artery  non obstructive by cath in 2008 and 2010  .  Unspecified essential hypertension  .  Other and unspecified hyperlipidemia  .  Syncope and collapse  near-syncopal episode in November/2008  .  Unspecified cardiovascular disease  Decreased left-ventricular fxn  .  CVA (cerebral infarction)  Small right parietal noted incidentally 04/2007  .  Unspecified hereditary and idiopathic peripheral neuropathy  .  Obesity, unspecified  .  Esophageal reflux  .  History of diabetes mellitus, type II  .  Diaphragmatic hernia without mention of obstruction or gangrene  .  Irritable bowel syndrome  .  Morbid obesity  .  Diabetes mellitus  .  Cellulitis  left foot  .  Anginal pain   Functional Problem List  Patient demonstrates impairments in the following areas: Balance, Bladder, Bowel, Endurance, Medication Management, Motor, Pain, Safety and Skin Integrity  Basic ADL's: grooming, bathing, dressing and toileting Advanced ADL's: simple meal preparation  Transfers:  bed mobility, bed to chair, toilet, tub/shower, car and furniture Locomotion:  ambulation, wheelchair mobility and stairs  Additional Impairments:  None  Anticipated Outcomes Item Anticipated Outcome  Eating/Swallowing    Basic self-care    Tolieting    Bowel/Bladder  Continent/independent  Transfers  Mod I  Locomotion  Mod I short distance gait and household w/c  Communication    Cognition    Pain    Pain managed with scheduled MS contin and prn MSIR @/< "5"  Safety/Judgment    Other     Therapy Plan: PT Frequency: 2-3 X/day, 60-90 minutes OT Frequency: 1-2 X/day, 60-90 minutes   ELOS 7-11 days  Team  Interventions: Item RN PT OT SLP SW TR Other  Self Care/Advanced ADL Retraining   x      Neuromuscular Re-Education         Therapeutic Activities  x x   x   UE/LE Strength Training/ROM  x    x   UE/LE Coordination Activities         Visual/Perceptual Remediation/Compensation         DME/Adaptive Equipment Instruction  x x   x   Therapeutic Exercise  x x   x   Balance/Vestibular Training  x x   x   Patient/Family Education x x x   x   Cognitive Remediation/Compensation         Functional Mobility Training  x x   x   Ambulation/Gait Training  x       Stair Training  x       Wheelchair Propulsion/Positioning  x       Functional Tourist information centre manager Reintegration  x x   x   Dysphagia/Aspiration Film/video editor         Bladder Management         Bowel Management         Disease Management/Prevention x        Pain Management x x       Medication Management x        Skin Care/Wound Management x        Splinting/Orthotics         Discharge  Planning      x   Psychosocial Support   x   x                      Team Discharge Planning: Destination:  Home Projected Follow-up:  PT Projected Equipment Needs:  Cushion and Wheelchair Patient/family involved in discharge planning:  Yes  MD ELOS: left BKA Medical Rehab Prognosis:  Good Assessment: 56 year old female with peripheral vascular disease admitted for left lower extremity ischemia. She underwent left BKA and now requires 24 7 rehabilitation RN and M.D. As well as CIR level PT and OT

## 2012-01-16 NOTE — Evaluation (Signed)
Physical Therapy Assessment and Plan  Patient Details  Name: [name] MRN: [id number] Date of Birth: March 24, 1956  PT Diagnosis: Difficulty walking and Pain in L limb Rehab Potential: Excellent ELOS: 7-11 days   Today's Date: 01/16/2012 Time: 7829-5621 Time Calculation (min): 29 min  Problem List:  Patient Active Problem List  Diagnosis  . HYPERLIPIDEMIA  . OBESITY  . PERIPHERAL NEUROPATHY  . HYPERTENSION  . CAD, NATIVE VESSEL  . LEFT VENTRICULAR FUNCTION, DECREASED  . CVA (cerebral infarction)  . GERD  . HIATAL HERNIA  . Irritable bowel syndrome  . SYNCOPE  . DM type 2, uncontrolled, with neuropathy  . Syncope  . Hypokalemia  . HTN (hypertension)  . UTI (lower urinary tract infection)  . H/O: CVA (cardiovascular accident)  . Normocytic anemia  . Facial droop  . Hyperglycemia  . Mild dehydration  . Numbness  . TIA (transient ischemic attack)  . Candiduria  . Paresthesia of right arm and leg  . Cellulitis of left lower extremity  . Diabetic ulcer of left foot  . Hypoglycemia associated with diabetes  . Chest pain  . Dizziness    Past Medical History:  Past Medical History  Diagnosis Date  . Coronary atherosclerosis of native coronary artery     non obstructive by cath in 2008 and 2010  . Unspecified essential hypertension   . Other and unspecified hyperlipidemia   . Syncope and collapse     near-syncopal episode in November/2008  . Unspecified cardiovascular disease     Decreased left-ventricular fxn  . 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 Unspecified hereditary and idiopathic peripheral neuropathy   . Obesity, unspecified   . Esophageal reflux   . History of diabetes mellitus, type II   . Diaphragmatic hernia without mention of obstruction or gangrene   . Irritable bowel syndrome   . Morbid obesity   . Diabetes mellitus   . Cellulitis     left foot  . Anginal pain   . Stroke     2013-'no residual'    Past Surgical History:  Past Surgical History  Procedure Date  . Invasive electrophysiologic study 5/09    followed by insertion of an implantable loop recorder. S/p removal   . Colonoscopy   . Cholecystectomy   . Multiple toe surgeries   . Cardiac catheterization     LAD 30%, circumflex 50%, OM 75%, RI 60% with small branch 80%, dominant RCA 60%, EF 45-50%  . I&d extremity 12/23/2011    Procedure: IRRIGATION AND DEBRIDEMENT EXTREMITY;  Surgeon: Verlee Rossetti, MD;  Location: [facility] OR;  Service: Orthopedics;  Laterality: Left;  Left Foot  . I&d extremity 12/26/2011    Procedure: IRRIGATION AND DEBRIDEMENT EXTREMITY;  Surgeon: Toni Arthurs, MD;  Location: [facility] OR;  Service: Orthopedics;  Laterality: Left;  LEFT FOOT I&D WITH POSSIBLE WOUND VAC APPLICATION, POSSIBLE LEFT FIRST RAY AMPUTATION  . Amputation 12/30/2011    Procedure: AMPUTATION RAY;  Surgeon: Toni Arthurs, MD;  Location: [facility] OR;  Service: Orthopedics;  Laterality: Left;  LEFT FIRST RAY AMPUTATION  . Eye surgery     cataract  . Amputation 01/13/2012    Procedure: AMPUTATION BELOW KNEE;  Surgeon: Toni Arthurs, MD;  Location: [facility] OR;  Service: Orthopedics;  Laterality: Left;    Assessment & Plan Clinical Impression: PatiePatient is a 56 y.o. 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 Patient elected  to undergo L-BKA on 01/14/12 by Dr. Victorino Dike. Post op has had problems with pain control. Patient transferred to CIR on 01/16/2012 .     Patient currently requires mod with gait/mobility secondary to decreased cardoirespiratory endurance, limited ROM and pain in L residual limb, decreased standing balance.  Prior to hospitalization, patient was I with mobility and lived with Alone in a House home.  Home access is 1 step to enter but she is  to ask landlord for ramp.  Home is w/c accessible per pt.  Patient will benefit from skilled PT intervention to maximize safe functional mobility, minimize  fall risk and decrease caregiver burden for planned discharge home with intermittent assist.  Anticipate patient will HH vs OP TBD at discharge.  PT - End of Session Activity Tolerance: Tolerates 30+ min activity with multiple rests Endurance Deficit: Yes Endurance Deficit Description: decreased cardiorespiratory endurance PT Assessment Rehab Potential: Excellent Barriers to Discharge: None PT Plan PT Frequency: 2-3 X/day, 60-90 minutes Estimated Length of Stay: 7-11 days PT Treatment/Interventions: Ambulation/gait training;Community reintegration;DME/adaptive equipment instruction;UE/LE Strength taining/ROM;Wheelchair propulsion/positioning;Balance/vestibular training;Discharge planning;Patient/family Counselling psychologist;Therapeutic Exercise;Functional mobility training PT Recommendation Follow Up Recommendations: Home health PT;Outpatient PT (TBD ) Equipment Recommended: Wheelchair (measurements);Wheelchair cushion (measurements)  PT Evaluation Precautions/Restrictions Precautions Precautions: Fall Required Braces or Orthoses: Other Brace/Splint Other Brace/Splint: Pt was given knee immobilizer. No orders stating what leg or wearing schedule. Restrictions Other Position/Activity Restrictions: BKA Vital Signs  Pulse Rate: 104  (with gait) Pain Pain Assessment Pain Assessment:  (c/o phantom pain, premedicated, denied intervention) Pain Intervention(s): Medication (See eMAR) Home Living/Prior Functioning Home Living Lives With: Alone Available Help at Discharge: Other (Comment) (niece, nephew and sister daily assist)) Type of Home: House Home Access: Stairs to enter [employer] of Steps: to ask landlord for ramp Home Layout: One level Bathroom Shower/Tub: Forensic scientist: Standard Bathroom Accessibility: Yes How Accessible: Accessible via wheelchair Home Adaptive Equipment: Straight cane;Walker - rolling;Bedside commode/3-in-1 Prior  Function Level of Independence: Independent with basic ADLs;Independent with transfers;Requires assistive device for independence Able to Take Stairs?: Yes Driving: Yes 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[employer]) Vision/Perception  Vision - History Baseline Vision: Wears glasses only for reading Patient Visual Report: No change from baseline Vision - Assessment Eye Alignment: Within Functional Limits  Cognition Overall Cognitive Status: Appears within functional limits for tasks assessed Arousal/Alertness: Awake/alert Orientation Level: Oriented X4 Sensation pt reports phantom limb and phantom pain Sensation Light Touch: Appears Intact Stereognosis: Appears Intact Hot/Cold: Appears Intact Proprioception: Appears Intact Additional Comments: peripheral neuropathy but can feel light touch Coordination Gross Motor Movements are Fluid and Coordinated: Yes Fine Motor Movements are Fluid and Coordinated: Yes Motor  Motor Motor: Within Functional Limits  Mobility Transfers Sit to Stand: 4: Min assist;With upper extremity assist;With armrests;From chair/3-in-1 Stand to Sit: 4: Min assist;With armrests;To chair/3-in-1 Locomotion     Trunk/Postural Assessment  Cervical Assessment Cervical Assessment: Within Functional Limits Thoracic Assessment Thoracic Assessment: Within Functional Limits Lumbar Assessment Lumbar Assessment: Within Functional Limits  Balance Static Sitting Balance Static Sitting - Level of Assistance: 7: Independent Dynamic Sitting Balance Dynamic Sitting - Level of Assistance: 5: Stand by assistance Static Standing Balance Static Standing - Balance Support: Bilateral upper extremity supported Static Standing - Level of Assistance: 4: Min assist Dynamic Standing Balance Dynamic Standing - Level of Assistance: 3: Mod assist Extremity Assessment  RUE Assessment RUE Assessment: Exceptions to [facility] RUE AROM (degrees) Overall AROM Right Upper  Extremity: Within functional limits for tasks performed RUE  Strength RUE Overall Strength: Within Functional Limits for tasks performed (4/5: MMT) LUE Assessment LUE Assessment: Exceptions to WFL LUE AROM (degrees) Overall AROM Left Upper Extremity: Within functional limits for tasks assessed LUE Strength LUE Overall Strength: Within Functional Limits for tasks assessed (4/5:MMT) RLE Assessment RLE Assessment: Within Functional Limits LLE Assessment LLE Assessment: Exceptions to WFL LLE AROM (degrees) LLE Overall AROM Comments: full ROM at hip, unable to assess knee d/t dressing limits knee flexion, full extention LLE Strength LLE Overall Strength:  (>3+/5 hip, knee NT- in dressing with limited flexion)  See FIM for current functional status Refer to Care Plan for Long Term Goals  Recommendations for other services: None  Discharge Criteria: Patient will be discharged from PT if patient refuses treatment 3 consecutive times without medical reason, if treatment goals not met, if there is a change in medical status, if patient makes no progress towards goals or if patient is discharged from hospital.  The above assessment, treatment plan, treatment alternatives and goals were discussed and mutually agreed upon: by patient  Michaelene Song 01/16/2012, 3:11 PM

## 2012-01-16 NOTE — Plan of Care (Signed)
Problem: Discharge Progression Outcomes Goal: Other Discharge Outcomes/Goals Outcome: Progressing Pt given discharge instructions via teachback method and pt verbalizes understanding of the instructions.  Pt taken down to inpt rehab unit via wheelchair and passed off to receiving RN. Pt had no complaints of pain and vital signs within normal limits. Surgical site appears to have to signs or symptoms of infection and dressing is clean dry and intact

## 2012-01-16 NOTE — Progress Notes (Signed)
Notified by Ottie Glazier- CIR that they will accept patient to CIR.  Notified [facility] of this as they had arranged for SNF placement of patient.  Patient is pleased with d/c plan. CSW will sign off.  Lorri Frederick. [place]  [phone]

## 2012-01-16 NOTE — PMR Pre-admission (Signed)
PMR Admission Coordinator Pre-Admission Assessment  Patient: [name] is an 56 y.o., female MRN: [id number] DOB: Oct 25, 1955 Height:   Weight:    Insurance Information HMO:     PPO:      PCP:      IPA:      80/20:      OTHER: open access PRIMARY: [employer]      Policy#: [id number]      Subscriber: pt CM Name: [name]      Phone#: [phone] Ext [phone]     Fax#: [phone] Pre-Cert#: [id number] update needed 01/23/12      Employer: [employer]:  Phone #: [phone]     Name: 8/30 Eff. Date: 05/20/11     Deduct: $1500 met      Out of Pocket Max: $3000 met      Life Max: unlimited CIR: 90%     SNF: 10% Outpatient: 90%     Co-Pay: 10% Home Health: 90%      Co-Pay: 10% DME: 90%     Co-Pay: 10% Providers: in network  SECONDARY: none Medicaid Application Date:       Case Manager:  Disability Application Date:       Case Worker:   Emergency Contact Information Contact Information    Name Relation Home Work Mobile   [place] Father [phone]     Milly Jakob Niece [phone]     Irma Newness [phone]       Current Medical History  Patient Admitting Diagnosis: L BKA History of Present Illnes72 y.o. 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 Victorino Dike. Dressings can be changed starting tomorrow daily and as needed with dry gauze and a stump shrinker sock to hold the dressings in place.   Past Medical History  Past Medical History  Diagnosis Date  . Coronary atherosclerosis of native coronary artery     non obstructive by cath in 2008 and 2010  . Unspecified essential hypertension   . Other and unspecified hyperlipidemia   . Syncope and collapse     near-syncopal episode in November/2008  . Unspecified cardiovascular disease     Decreased left-ventricular fxn  . 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  Unspecified hereditary and idiopathic peripheral neuropathy   . Obesity, unspecified   . Esophageal reflux   . History of diabetes mellitus, type II   . Diaphragmatic hernia without mention of obstruction or gangrene   . Irritable bowel syndrome   . Morbid obesity   . Diabetes mellitus   . Cellulitis     left foot  . Anginal pain   . Stroke     2013-'no residual'    Family History  family history includes Coronary artery disease in her father; Diabetes in her father; Gallbladder disease in her mother; Heart failure in her mother; and Pneumonia in her mother.  There is no history of Stroke.  Prior Rehab/Hospitalizations: none  Current Medications  Current facility-administered medications:clopidogrel (PLAVIX) tablet 75 mg, 75 mg, Oral, Q breakfast, Toni Arthurs, MD, 75 mg at 01/15/12 0854;  diazepam (VALIUM) tablet 5 mg, 5 mg, Oral, Q8H PRN, Toni Arthurs, MD, 5 mg at 01/14/12 1752;  docusate sodium (COLACE) capsule 100 mg, 100 mg, Oral, BID, Toni Arthurs, MD, 100 mg at 01/15/12 2201;  enoxaparin (LOVENOX) injection 40 mg, 40 mg, Subcutaneous, Q24H, Toni Arthurs, MD, 40 mg at  01/15/12 0853 glimepiride (AMARYL) tablet 4 mg, 4 mg, Oral, Daily, Toni Arthurs, MD, 4 mg at 01/15/12 1055;  insulin aspart (novoLOG) injection 6 Units, 6 Units, Subcutaneous, TID WC, Toni Arthurs, MD, 6 Units at 01/16/12 0757;  insulin glargine (LANTUS) injection 20 Units, 20 Units, Subcutaneous, QHS, Toni Arthurs, MD, 20 Units at 01/14/12 2227;  lisinopril (PRINIVIL,ZESTRIL) tablet 20 mg, 20 mg, Oral, Daily, Toni Arthurs, MD, 20 mg at 01/15/12 1055 metFORMIN (GLUCOPHAGE) tablet 500 mg, 500 mg, Oral, BID WC, Toni Arthurs, MD, 500 mg at 01/15/12 0854;  metoCLOPramide (REGLAN) injection 5-10 mg, 5-10 mg, Intravenous, Q8H PRN, Toni Arthurs, MD;  metoCLOPramide (REGLAN) tablet 5-10 mg, 5-10 mg, Oral, Q8H PRN, Toni Arthurs, MD;  mupirocin ointment (BACTROBAN) 2 %, , Nasal, BID, Toni Arthurs, MD, 1 application at 01/15/12 2200 ondansetron  (ZOFRAN) injection 4 mg, 4 mg, Intravenous, Q6H PRN, Toni Arthurs, MD;  ondansetron [facility]) tablet 4 mg, 4 mg, Oral, Q6H PRN, Toni Arthurs, MD;  oxyCODONE (Oxy IR/ROXICODONE) immediate release tablet 5-15 mg, 5-15 mg, Oral, Q4H PRN, Toni Arthurs, MD, 10 mg at 01/16/12 1610;  senna (SENOKOT) tablet 8.6 mg, 1 tablet, Oral, BID, Toni Arthurs, MD, 8.6 mg at 01/15/12 1055 simvastatin (ZOCOR) tablet 40 mg, 40 mg, Oral, QPM, Toni Arthurs, MD, 40 mg at 01/15/12 1751;  zolpidem (AMBIEN) tablet 5 mg, 5 mg, Oral, QHS PRN,MR X 1, Toni Arthurs, MD;  DISCONTD: HYDROmorphone (DILAUDID) injection 0.5-1 mg, 0.5-1 mg, Intravenous, Q2H PRN, Toni Arthurs, MD, 1 mg at 01/15/12 1946;  DISCONTD: oxyCODONE (Oxy IR/ROXICODONE) immediate release tablet 5-15 mg, 5-15 mg, Oral, Q3H PRN, Toni Arthurs, MD, 15 mg at 01/15/12 1300  Patients Current Diet: Carb Control  Precautions / Restrictions Precautions Precautions: Fall Restrictions LLE Weight Bearing: Non weight bearing   Prior Activity Level 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  Current Functional Level Cognition  Arousal/Alertness: Awake/alert Overall Cognitive Status: Appears within functional limits for tasks assessed/performed Orientation Level: Oriented X4    Extremity Assessment (includes Sensation/Coordination)  RUE ROM/Strength/Tone: Within functional levels  RLE ROM/Strength/Tone: WFL for tasks assessed    ADLs  Eating/Feeding:  Performed;Independent Grooming: Performed;Set up Where Assessed - Grooming: Supported sitting Upper Body Bathing: Simulated;Set up Where Assessed - Upper Body Bathing: Supported sitting Lower Body Bathing: Simulated;Maximal assistance Where Assessed - Lower Body Bathing: Supported sit to stand Upper Body Dressing: Simulated;Set up Where Assessed - Upper Body Dressing: Unsupported sitting Lower Body Dressing: Simulated;Maximal assistance Where Assessed - Lower Body Dressing: Supported sit to stand Toilet Transfer: Performed;Moderate assistance Toilet Transfer Method: Sit to stand;Stand pivot Toilet Transfer Equipment: Bedside commode Toileting - Clothing Manipulation and Hygiene: Performed;Moderate assistance Where Assessed - Toileting Clothing Manipulation and Hygiene: Standing Equipment Used: Gait belt Transfers/Ambulation Related to ADLs: Std pivot transfer only with RW. ADL Comments: A for LB ADL. Very motivated to become independent.     Mobility  Bed Mobility: Sit to Supine;Supine to Sit;Rolling Right;Rolling Left Rolling Right: 6: Modified independent (Device/Increase time) Rolling Left: 6: Modified independent (Device/Increase time);With rail Supine to Sit: 7: Independent Sitting - Scoot to Edge of Bed: 7: Independent Sit to  Supine: 7: Independent    Transfers  Transfers: Sit to Stand;Stand to [employer] Transfers Sit to Stand: 4: Min assist;With upper extremity assist;From chair/3-in-1 Stand to Sit: 4: Min guard;With upper extremity assist;To bed Stand Pivot Transfers: 4: Min assist    Ambulation / Gait / Stairs / Psychologist, prison and probation services  Ambulation/Gait Ambulation/Gait Assistance: 4: Min Environmental consultant (Feet): 10 Feet Assistive device: Rolling walker Ambulation/Gait Assistance Details: Pt required assistance to prevent falling when changing direction.  Instructed pt on proper use of RW.   Gait Pattern: Step-to pattern;Trunk flexed Gait velocity:  decreased Stairs: No Wheelchair Mobility Wheelchair Mobility: No    Posture / Holiday representative Standing - Balance Support: Bilateral upper extremity supported Static Standing - Level of Assistance: 3: Mod assist Static Standing - Comment/# of Minutes: less than 30 seconds pt with A-P swiay and lob postieriorly x 2     Previous Home Environment Living Arrangements: Alone Lives With: Alone Available Help at Discharge:  (niece, nephew and sister daily assist) Type of Home: House Home Layout: One level Home Access: Stairs to enter Secretary/administrator of Steps: 1 Bathroom Shower/Tub: Forensic scientist: Standard Bathroom Accessibility: Yes How Accessible: Accessible via walker  Discharge Living Setting Plans for Discharge Living Setting: Patient's home Type of Home at Discharge: House Discharge Home Layout: One level Discharge Home Access: Stairs to enter Entrance Stairs-Number of Steps: one Discharge Bathroom Shower/Tub: Tub/shower unit Discharge Bathroom Toilet: Standard Discharge Bathroom Accessibility: Yes How Accessible: Accessible via walker  Social/Family/Support Systems Patient Roles: Other (Comment) (employee) Contact Information: niece and nephew Anticipated Caregiver: prn daily check in by niece and nephew Anticipated Caregiver's Contact Information: see above Ability/Limitations of Caregiver: intermittant Caregiver Availability: Intermittent Discharge Plan Discussed with Primary Caregiver: No Is Caregiver In Agreement with Plan?: Yes Does Caregiver/Family have Issues with Lodging/Transportation while Pt is in Rehab?: No  Goals/Additional Needs Patient/Family Goal for Rehab: Mod I with PT and OT Expected length of stay: ELOS 7 days Additional Information: Pt plans to go back to work asap. Needs assistance with car transfers, w/c mobility in the community, etc Pt/Family Agrees to Admission and willing to participate:  Yes Program Orientation Provided & Reviewed with Pt/Caregiver Including Roles  & Responsibilities: Yes  Patient Condition: This patient's condition remains as documented in the Consult dated 01/15/12, in which the Rehabilitation Physician determined and documented that the patient's condition is appropriate for intensive rehabilitative care in an inpatient rehabilitation facility.  Preadmission Screen Completed By:  Clois Dupes, 01/16/2012 9:58 AM ______________________________________________________________________   Discussed status with Dr.  Riley Kill on 01/16/12 at  0830 and received telephone approval for admission today.  Admission Coordinator:  Clois Dupes, time 1610 Date 01/16/12.

## 2012-01-16 NOTE — Progress Notes (Signed)
1200 admitted to rehab via w/c, transferred to chair; oriented to rehab, routine and schedule for therapy. Reviewed medications, plan of care, orders, see CHL for details. Pamelia Hoit

## 2012-01-17 ENCOUNTER — Inpatient Hospital Stay (HOSPITAL_COMMUNITY): Payer: Managed Care, Other (non HMO) | Admitting: Occupational Therapy

## 2012-01-17 ENCOUNTER — Inpatient Hospital Stay (HOSPITAL_COMMUNITY): Payer: Managed Care, Other (non HMO) | Admitting: *Deleted

## 2012-01-17 ENCOUNTER — Inpatient Hospital Stay (HOSPITAL_COMMUNITY): Payer: Managed Care, Other (non HMO) | Admitting: Physical Therapy

## 2012-01-17 DIAGNOSIS — I739 Peripheral vascular disease, unspecified: Secondary | ICD-10-CM

## 2012-01-17 DIAGNOSIS — L98499 Non-pressure chronic ulcer of skin of other sites with unspecified severity: Secondary | ICD-10-CM

## 2012-01-17 DIAGNOSIS — Z5189 Encounter for other specified aftercare: Secondary | ICD-10-CM

## 2012-01-17 DIAGNOSIS — E1165 Type 2 diabetes mellitus with hyperglycemia: Secondary | ICD-10-CM

## 2012-01-17 DIAGNOSIS — I2510 Atherosclerotic heart disease of native coronary artery without angina pectoris: Secondary | ICD-10-CM

## 2012-01-17 DIAGNOSIS — S88119A Complete traumatic amputation at level between knee and ankle, unspecified lower leg, initial encounter: Secondary | ICD-10-CM

## 2012-01-17 LAB — GLUCOSE, CAPILLARY
Glucose-Capillary: 77 mg/dL (ref 70–99)
Glucose-Capillary: 106 mg/dL — ABNORMAL HIGH (ref 70–99)
Glucose-Capillary: 57 mg/dL — ABNORMAL LOW (ref 70–99)

## 2012-01-17 MED ORDER — INSULIN GLARGINE 100 UNIT/ML ~~LOC~~ SOLN
8.0000 [IU] | Freq: Once | SUBCUTANEOUS | Status: AC
  Administered 2012-01-17: 8 [IU] via SUBCUTANEOUS

## 2012-01-17 MED ORDER — PNEUMOCOCCAL VAC POLYVALENT 25 MCG/0.5ML IJ INJ
0.5000 mL | INJECTION | Freq: Once | INTRAMUSCULAR | Status: AC
  Administered 2012-01-17: 0.5 mL via INTRAMUSCULAR
  Filled 2012-01-17: qty 0.5

## 2012-01-17 MED ORDER — POLYETHYLENE GLYCOL 3350 17 G PO PACK
17.0000 g | PACK | Freq: Every day | ORAL | Status: DC
  Administered 2012-01-17 – 2012-01-26 (×7): 17 g via ORAL
  Filled 2012-01-17 (×13): qty 1

## 2012-01-17 NOTE — Progress Notes (Signed)
Occupational Therapy Session Note  Patient Details  Name: [name] MRN: [id number] Date of Birth: [birth year]  Today's Date: 01/17/2012 Time:  -   1100-1200  (60 min)    Short Term Goals: Week 1:  OT Short Term Goal 1 (Week 1): Patient will perform bathing at bed level at Supervision. OT Short Term Goal 2 (Week 1): Patient will perform dressing at bed level at Supervision. OT Short Term Goal 3 (Week 1): Patient will perform toileting at Supervision level. OT Short Term Goal 4 (Week 1): Patient will perform Toilet transfer at Supervision level with bedside commode. OT Short Term Goal 5 (Week 1): Patient will perform tub/shower transfer at Supervision level with tub transfer bench.  Skilled Therapeutic Interventions/Progress Updates:    Pt. Engaged in bathing and dressing at sink level.  She did sit to stand with minimal assist for peri care.  Pt. Needed assist donning pants on left leg.  Pt. Maneuvered wc in room with minimal verbal cues.  She displayed good safety awareness, balance, and control during session.  Therapy Documentation Precautions:  Precautions Precautions: Fall Required Braces or Orthoses: Other Brace/Splint Other Brace/Splint: Pt was given knee immobilizer. No orders stating what leg or wearing schedule. Restrictions Weight Bearing Restrictions: Yes LLE Weight Bearing: Non weight bearing Other Position/Activity Restrictions: BKA       Pain:  None at this time.  Was hurting earlier but had pain pill   ADL:   Exercises:   Other Treatments:    See FIM for current functional status  Therapy/Group: Individual Therapy  Humberto Seals 01/17/2012, 11:35 AM

## 2012-01-17 NOTE — Progress Notes (Signed)
Physical Therapy Note  Patient Details  Name: [name] MRN: [id number] Date of Birth: Dec 12, 1955 Today's Date: 01/17/2012  15:30-16:00 individual therapy 5/10 pain pt declined intervention  Performed gait with rw 2 x 5' focusing on turning and backing with vc for walker placement. Squat pivot transfersmin assist to supervision with vc for set up and technique. Performed prone and seated exercises.    Julian Reil 01/17/2012, 5:12 PM

## 2012-01-17 NOTE — Progress Notes (Signed)
Pt refused suppository.  

## 2012-01-17 NOTE — Progress Notes (Signed)
CC: L-BKA          C/o constipation - several days   HPI: [name] is a 56 y.o. 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 Victorino Dike. Post op has had problems with pain control. Therapy evaluations done and CIR recommended for progression.  Review of Systems  HENT: Negative for hearing loss.  Eyes: Negative for blurred vision and double vision.  Respiratory: Negative for cough and shortness of breath.  Cardiovascular: Negative for chest pain and palpitations.  Gastrointestinal: Negative for heartburn, nausea and constipation.  Genitourinary: Negative for urgency and frequency.  Musculoskeletal:  Poor post op pain control  Neurological: Negative for headaches.  Psychiatric/Behavioral: The patient is not nervous/anxious and does not have insomnia.  Past Medical History   Diagnosis  Date   .  Coronary atherosclerosis of native coronary artery      non obstructive by cath in 2008 and 2010   .  Unspecified essential hypertension    .  Other and unspecified hyperlipidemia    .  Syncope and collapse      near-syncopal episode in November/2008   .  Unspecified cardiovascular disease      Decreased left-ventricular fxn   .  CVA (cerebral infarction)      Small right parietal noted incidentally 04/2007   .  Unspecified hereditary and idiopathic peripheral neuropathy    .  Obesity, unspecified    .  Esophageal reflux    .  History of diabetes mellitus, type II    .  Diaphragmatic hernia without mention of obstruction or gangrene    .  Irritable bowel syndrome    .  Morbid obesity    .  Diabetes mellitus    .  Cellulitis      left foot   .  Anginal pain    .  Stroke      2013-'no residual'    Past Surgical History   Procedure  Date   .  Invasive electrophysiologic study  5/09     followed by insertion of an implantable loop recorder. S/p removal   .  Colonoscopy    .   Cholecystectomy    .  Multiple toe surgeries    .  Cardiac catheterization      LAD 30%, circumflex 50%, OM 75%, RI 60% with small branch 80%, dominant RCA 60%, EF 45-50%   .  I&d extremity  12/23/2011     Procedure: IRRIGATION AND DEBRIDEMENT EXTREMITY; Surgeon: Verlee Rossetti, MD; Location: [facility] OR; Service: Orthopedics; Laterality: Left; Left Foot   .  I&d extremity  12/26/2011     Procedure: IRRIGATION AND DEBRIDEMENT EXTREMITY; Surgeon: Toni Arthurs, MD; Location: [facility] OR; Service: Orthopedics; Laterality: Left; LEFT FOOT I&D WITH POSSIBLE WOUND VAC APPLICATION, POSSIBLE LEFT FIRST RAY AMPUTATION   .  Amputation  12/30/2011     Procedure: AMPUTATION RAY; Surgeon: Toni Arthurs, MD; Location: [facility] OR; Service: Orthopedics; Laterality: Left; LEFT FIRST RAY AMPUTATION   .  Eye surgery      cataract   .  Amputation  01/13/2012     Procedure: AMPUTATION BELOW KNEE; Surgeon: Toni Arthurs, MD; Location: [facility] OR; Service: Orthopedics; Laterality: Left;    Family History   Problem  Relation  Age of Onset   .  Pneumonia  Mother    .  Gallbladder disease  Mother  cancer    .  Heart failure  Mother    .  Diabetes  Father    .  Coronary artery disease  Father    .  Stroke  Neg Hx    Social History: lives alone. 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 She has a 10 pack-year smoking history. She has never used smokeless tobacco. She reports that she drinks about 2.4 ounces of alcohol per week. She reports that she does not use illicit drugs. Nephew can check in prn past discharge.  Allergies   Allergen  Reactions   .  Banana      Face puffs up   .  Penicillins  Itching     edema   .  Strawberry      Face puffs up    Medications Prior to Admission   Medication  Sig  Dispense  Refill   .  clopidogrel (PLAVIX) 75 MG tablet  Take 1 tablet (75 mg total) by mouth daily with breakfast.  30 tablet  3   .  docusate sodium 100 MG CAPS  Take  100 mg by mouth 2 (two) times daily.  10 capsule    .  enoxaparin (LOVENOX) 40 MG/0.4ML injection  Inject 0.4 mLs (40 mg total) into the skin daily.  12 Syringe  0   .  glimepiride (AMARYL) 4 MG tablet  Take 4 mg by mouth daily.     .  insulin aspart (NOVOLOG) 100 UNIT/ML injection  Inject 6 Units into the skin 3 (three) times daily with meals.  1 vial  3   .  insulin glargine (LANTUS) 100 UNIT/ML injection  Inject 20 Units into the skin at bedtime.     Marland Kitchen  lisinopril (PRINIVIL,ZESTRIL) 20 MG tablet  Take 20 mg by mouth daily.     .  metFORMIN (GLUCOPHAGE) 500 MG tablet  Take 500 mg by mouth 2 (two) times daily with a meal.     .  oxyCODONE (OXY IR/ROXICODONE) 5 MG immediate release tablet  Take 1-3 tablets (5-15 mg total) by mouth every 4 (four) hours as needed for pain.  50 tablet  0   .  senna (SENOKOT) 8.6 MG TABS  Take 2 tablets (17.2 mg total) by mouth 2 (two) times daily.  120 each    .  simvastatin (ZOCOR) 40 MG tablet  Take 40 mg by mouth every evening.     Home:  Functional History:  Functional Status:  Mobility:  ADL:  Cognition:  Blood pressure 161/84, temperature 97.7 F (36.5 C), temperature source Oral, resp. rate 18, SpO2 99.00%.  Physical Exam  Nursing note and vitals reviewed.  Constitutional: She appears well-developed and well-nourished.  Obese female, NAD  HENT:  Head: Normocephalic and atraumatic.  Eyes: Pupils are equal, round, and reactive to light.  Neck: Normal range of motion. Neck supple.  Cardiovascular: Normal rate and regular rhythm.  Pulmonary/Chest: Effort normal and breath sounds normal.  Abdominal: Soft. Bowel sounds are normal.  Musculoskeletal:  Dry skin right foot. L-BKA with compressive post surgical dressing in place.  Neurological: She has normal reflexes.  Alert and oriented. Strength 5/5 in UE. Unable to lift left leg due to pain. Right leg grossly 3+ to 4/5. No focal sensory deficits.  Skin:  Wound dressed. Leg appropriately tender. Leg  ACEd from thigh down.  Results for orders placed during the hospital encounter of 01/16/12 (from the past 48  hour(s))   GLUCOSE, CAPILLARY Status: Abnormal    Collection Time    01/16/12 12:14 PM   Component  Value  Range  Comment    Glucose-Capillary  116 (*)  70 - 99 mg/dL    No results found.  Post Admission Physician Evaluation:  1. Functional deficits secondary to left BKA. 2. Patient is admitted to receive collaborative, interdisciplinary care between the physiatrist, rehab nursing staff, and therapy team. 3. Patient's level of medical complexity and substantial therapy needs in context of that medical necessity cannot be provided at a lesser intensity of care such as a SNF. 4. Patient has experienced substantial functional loss from his/her baseline which was documented above under the "Functional History" and "Functional Status" headings. Judging by the patient's diagnosis, physical exam, and functional history, the patient has potential for functional progress which will result in measurable gains while on inpatient rehab. These gains will be of substantial and practical use upon discharge in facilitating mobility and self-care at the household level. 5. Physiatrist will provide 24 hour management of medical needs as well as oversight of the therapy plan/treatment and provide guidance as appropriate regarding the interaction of the two. 6. 24 hour rehab nursing will assist with bladder management, bowel management, safety, skin/wound care, disease management, medication administration, pain management and patient education and help integrate therapy concepts, techniques,education, etc. 7. PT will assess and treat for: fxnl mobility, pre-pros ed. Safety, pain mgt, lower extremity strength. Goals are: mod I. 8. OT will assess and treat for: UES, fxnl mobility, ADL's, safety. Goals are: mod I. 9. SLP will assess and treat for: n/a 10. Case Management and Social Worker will assess and treat for  psychological issues and discharge planning. 11. Team conference will be held weekly to assess progress toward goals and to determine barriers to discharge. 12. Patient will receive at least 3 hours of therapy per day at least 5 days per week. 13. ELOS: 7-10 days Prognosis: excellent Medical Problem List and Plan:  1. DVT Prophylaxis/Anticoagulation: Pharmaceutical: Lovenox  2. Pain Management: reports oxycodone ineffective. Will start MS contin for consistent pain relief with prn MSIR.  3. Mood: seems to have good outlook. Will have LCSW follow up for formal evaluation.  4. Neuropsych: This patient is capable of making decisions on his/her own behalf.  5. DM type 2: Monitor with AC/HSCBG checks. Will continue amaryl, metformin and lantus. Continue 6 units for meal coverage and titrate lantus as needed for tighter control.  6. Hypokalemia: Likely dilutional. Will supplement and recheck on Monday.  7. ABLA: Add iron supplement. Will recheck on Monday.  8. Wound: continue post-op dressing. Remove dressing over the next 2-3 days. 9. Constipation: see meds

## 2012-01-17 NOTE — Progress Notes (Signed)
Dr. Amador Cunas notified of patient due to receive Lantus 20 units q HS but CBG at breakfast was 69, at lunch 106, and at supper 57.  Order received:  Give Lantus 8 units tonight.

## 2012-01-17 NOTE — Progress Notes (Signed)
Occupational Therapy Session Note  Patient Details  Name: [name] MRN: [id number] Date of Birth: [birth year]  Today's Date: 01/17/2012 Time: 1400-1445 Time Calculation (min): 45 min  Short Term Goals: Week 1:  OT Short Term Goal 1 (Week 1): Patient will perform bathing at bed level at Supervision. OT Short Term Goal 2 (Week 1): Patient will perform dressing at bed level at Supervision. OT Short Term Goal 3 (Week 1): Patient will perform toileting at Supervision level. OT Short Term Goal 4 (Week 1): Patient will perform Toilet transfer at Supervision level with bedside commode. OT Short Term Goal 5 (Week 1): Patient will perform tub/shower transfer at Supervision level with tub transfer bench.  Skilled Therapeutic Interventions/Progress Updates: Pt seen to address ADL transfers and kitchen mobility/safety and standing balance.  Pt used RW to take 5 steps from w/c to tub bench with steady assist and transferred in/out of tub with steady assist.  Discussed need for patient to use this at home.  Pt worked on Therapist, music with standing at [employer] to remove plates from cupboards and from w/c level to retrieve items from refrigerator.  Discussed kitchen safety and strategies to make meal prep safer. Pt stated that she uses paper plates and cups, instant microwave oatmeal, and cooks in her toaster oven.     Therapy Documentation Precautions:  Precautions Precautions: Fall Required Braces or Orthoses: Other Brace/Splint Other Brace/Splint: Pt was given knee immobilizer. No orders stating what leg or wearing schedule. Restrictions Weight Bearing Restrictions: Yes LLE Weight Bearing: Non weight bearing Other Position/Activity Restrictions: BKA Pain: Pain Assessment Pain Assessment: No/denies pain  See FIM for current functional status  Therapy/Group: Individual Therapy  Carolyne Whitsel 01/17/2012, 3:57 PM

## 2012-01-17 NOTE — Progress Notes (Signed)
Blood sugar 57, rechecked after snack 58, rechecked after meal 88

## 2012-01-17 NOTE — Progress Notes (Signed)
Physical Therapy Note  Patient Details  Name: [name] MRN: [id number] Date of Birth: 12-02-1955 Today's Date: 01/17/2012  9:30-10:30 individual therapy 5/10 pain in left leg. Pt declined intervention   Pt requested not to go to gym because she was not dressed. performed stand/squat transfer with minguard assist and wc mobility supervision x 100'. Pt was educated on importance of flexibility of left hip, pre-prosthetic care, intact foot care, desensitization techniques, reasoning for wrapping, and positioning. Pt was shown First step magazine and pages were marked for important information.Marland Kitchen   Julian Reil 01/17/2012, 12:27 PM

## 2012-01-18 ENCOUNTER — Inpatient Hospital Stay (HOSPITAL_COMMUNITY): Payer: Managed Care, Other (non HMO) | Admitting: Physical Therapy

## 2012-01-18 LAB — GLUCOSE, CAPILLARY
Glucose-Capillary: 67 mg/dL — ABNORMAL LOW (ref 70–99)
Glucose-Capillary: 95 mg/dL (ref 70–99)
Glucose-Capillary: 118 mg/dL — ABNORMAL HIGH (ref 70–99)
Glucose-Capillary: 72 mg/dL (ref 70–99)
Glucose-Capillary: 153 mg/dL — ABNORMAL HIGH (ref 70–99)

## 2012-01-18 MED ORDER — INSULIN GLARGINE 100 UNIT/ML ~~LOC~~ SOLN: 6.0000 [IU] | Freq: Every day | SUBCUTANEOUS | Status: DC

## 2012-01-18 MED ORDER — SORBITOL 70 % SOLN
15.0000 mL | Freq: Every day | Status: DC | PRN
  Administered 2012-01-21: 15 mL via ORAL
  Filled 2012-01-18: qty 30

## 2012-01-18 NOTE — Progress Notes (Signed)
CC: L-BKA          C/o constipation - several days        No new complaints   HPI: [name] is a 56 y.o. 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 Victorino Dike. Post op has had problems with pain control. Therapy evaluations done and CIR recommended for progression.  Review of Systems  HENT: Negative for hearing loss.  Eyes: Negative for blurred vision and double vision.  Respiratory: Negative for cough and shortness of breath.  Cardiovascular: Negative for chest pain and palpitations.  Gastrointestinal: Negative for heartburn, nausea and constipation.  Genitourinary: Negative for urgency and frequency.  Musculoskeletal:  Poor post op pain control  Neurological: Negative for headaches.  Psychiatric/Behavioral: The patient is not nervous/anxious and does not have insomnia.  Past Medical History   Diagnosis  Date   .  Coronary atherosclerosis of native coronary artery      non obstructive by cath in 2008 and 2010   .  Unspecified essential hypertension    .  Other and unspecified hyperlipidemia    .  Syncope and collapse      near-syncopal episode in November/2008   .  Unspecified cardiovascular disease      Decreased left-ventricular fxn   .  CVA (cerebral infarction)      Small right parietal noted incidentally 04/2007   .  Unspecified hereditary and idiopathic peripheral neuropathy    .  Obesity, unspecified    .  Esophageal reflux    .  History of diabetes mellitus, type II    .  Diaphragmatic hernia without mention of obstruction or gangrene    .  Irritable bowel syndrome    .  Morbid obesity    .  Diabetes mellitus    .  Cellulitis      left foot   .  Anginal pain    .  Stroke      2013-'no residual'    Past Surgical History   Procedure  Date   .  Invasive electrophysiologic study  5/09     followed by insertion of an implantable loop recorder. S/p removal   .   Colonoscopy    .  Cholecystectomy    .  Multiple toe surgeries    .  Cardiac catheterization      LAD 30%, circumflex 50%, OM 75%, RI 60% with small branch 80%, dominant RCA 60%, EF 45-50%   .  I&d extremity  12/23/2011     Procedure: IRRIGATION AND DEBRIDEMENT EXTREMITY; Surgeon: Verlee Rossetti, MD; Location: [facility] OR; Service: Orthopedics; Laterality: Left; Left Foot   .  I&d extremity  12/26/2011     Procedure: IRRIGATION AND DEBRIDEMENT EXTREMITY; Surgeon: Toni Arthurs, MD; Location: [facility] OR; Service: Orthopedics; Laterality: Left; LEFT FOOT I&D WITH POSSIBLE WOUND VAC APPLICATION, POSSIBLE LEFT FIRST RAY AMPUTATION   .  Amputation  12/30/2011     Procedure: AMPUTATION RAY; Surgeon: Toni Arthurs, MD; Location: [facility] OR; Service: Orthopedics; Laterality: Left; LEFT FIRST RAY AMPUTATION   .  Eye surgery      cataract   .  Amputation  01/13/2012     Procedure: AMPUTATION BELOW KNEE; Surgeon: Toni Arthurs, MD; Location: [facility] OR; Service: Orthopedics; Laterality: Left;    Family History   Problem  Relation  Age of Onset   .  Pneumonia  Mother    .  Gallbladder disease  Mother       cancer    .  Heart failure  Mother    .  Diabetes  Father    .  Coronary artery disease  Father    .  Stroke  Neg Hx    Social History: lives alone. 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 She has a 10 pack-year smoking history. She has never used smokeless tobacco. She reports that she drinks about 2.4 ounces of alcohol per week. She reports that she does not use illicit drugs. Nephew can check in prn past discharge.  Allergies   Allergen  Reactions   .  Banana      Face puffs up   .  Penicillins  Itching     edema   .  Strawberry      Face puffs up    Medications Prior to Admission   Medication  Sig  Dispense  Refill   .  clopidogrel (PLAVIX) 75 MG tablet  Take 1 tablet (75 mg total) by mouth daily with breakfast.  30 tablet  3   .  docusate sodium  100 MG CAPS  Take 100 mg by mouth 2 (two) times daily.  10 capsule    .  enoxaparin (LOVENOX) 40 MG/0.4ML injection  Inject 0.4 mLs (40 mg total) into the skin daily.  12 Syringe  0   .  glimepiride (AMARYL) 4 MG tablet  Take 4 mg by mouth daily.     .  insulin aspart (NOVOLOG) 100 UNIT/ML injection  Inject 6 Units into the skin 3 (three) times daily with meals.  1 vial  3   .  insulin glargine (LANTUS) 100 UNIT/ML injection  Inject 20 Units into the skin at bedtime.     Marland Kitchen  lisinopril (PRINIVIL,ZESTRIL) 20 MG tablet  Take 20 mg by mouth daily.     .  metFORMIN (GLUCOPHAGE) 500 MG tablet  Take 500 mg by mouth 2 (two) times daily with a meal.     .  oxyCODONE (OXY IR/ROXICODONE) 5 MG immediate release tablet  Take 1-3 tablets (5-15 mg total) by mouth every 4 (four) hours as needed for pain.  50 tablet  0   .  senna (SENOKOT) 8.6 MG TABS  Take 2 tablets (17.2 mg total) by mouth 2 (two) times daily.  120 each    .  simvastatin (ZOCOR) 40 MG tablet  Take 40 mg by mouth every evening.     Home:  Functional History:  Functional Status:  Mobility:  ADL:  Cognition:  Blood pressure 161/84, temperature 97.7 F (36.5 C), temperature source Oral, resp. rate 18, SpO2 99.00%.  Physical Exam  Nursing note and vitals reviewed.  Constitutional: She appears well-developed and well-nourished.  Obese female, NAD  HENT:  Head: Normocephalic and atraumatic.  Eyes: Pupils are equal, round, and reactive to light.  Neck: Normal range of motion. Neck supple.  Cardiovascular: Normal rate and regular rhythm.  Pulmonary/Chest: Effort normal and breath sounds normal.  Abdominal: Soft. Bowel sounds are normal.  Musculoskeletal:  Dry skin right foot. L-BKA with compressive post surgical dressing in place.  Neurological: She has normal reflexes.  Alert and oriented. Strength 5/5 in UE. Unable to lift left leg due to pain. Right leg grossly 3+ to 4/5. No focal sensory deficits.  Skin:  Wound dressed. Leg  appropriately tender. Leg ACEd from thigh down.  Results for orders placed  during the hospital encounter of 01/16/12 (from the past 48 hour(s))   GLUCOSE, CAPILLARY Status: Abnormal    Collection Time    01/16/12 12:14 PM   Component  Value  Range  Comment    Glucose-Capillary  116 (*)  70 - 99 mg/dL    No results found.  Post Admission Physician Evaluation:  1. Functional deficits secondary to left BKA. 2. Patient is admitted to receive collaborative, interdisciplinary care between the physiatrist, rehab nursing staff, and therapy team. 3. Patient's level of medical complexity and substantial therapy needs in context of that medical necessity cannot be provided at a lesser intensity of care such as a SNF. 4. Patient has experienced substantial functional loss from his/her baseline which was documented above under the "Functional History" and "Functional Status" headings. Judging by the patient's diagnosis, physical exam, and functional history, the patient has potential for functional progress which will result in measurable gains while on inpatient rehab. These gains will be of substantial and practical use upon discharge in facilitating mobility and self-care at the household level. 5. Physiatrist will provide 24 hour management of medical needs as well as oversight of the therapy plan/treatment and provide guidance as appropriate regarding the interaction of the two. 6. 24 hour rehab nursing will assist with bladder management, bowel management, safety, skin/wound care, disease management, medication administration, pain management and patient education and help integrate therapy concepts, techniques,education, etc. 7. PT will assess and treat for: fxnl mobility, pre-pros ed. Safety, pain mgt, lower extremity strength. Goals are: mod I. 8. OT will assess and treat for: UES, fxnl mobility, ADL's, safety. Goals are: mod I. 9. SLP will assess and treat for: n/a 10. Case Management and Social Worker  will assess and treat for psychological issues and discharge planning. 11. Team conference will be held weekly to assess progress toward goals and to determine barriers to discharge. 12. Patient will receive at least 3 hours of therapy per day at least 5 days per week. 13. ELOS: 7-10 days Prognosis: excellent Medical Problem List and Plan:  1. DVT Prophylaxis/Anticoagulation: Pharmaceutical: Lovenox  2. Pain Management: reports oxycodone ineffective. Will start MS contin for consistent pain relief with prn MSIR.  3. Mood: seems to have good outlook. Will have LCSW follow up for formal evaluation.  4. Neuropsych: This patient is capable of making decisions on his/her own behalf.  5. DM type 2: Monitor with AC/HSCBG checks. Will continue amaryl, metformin and lantus. Lantus dose was reduced due to low CBGs. Continue 6 units for meal coverage and titrate lantus as needed for tighter control.  6. Hypokalemia: Likely dilutional. Will supplement and recheck on Monday.  7. ABLA: Add iron supplement. Will recheck on Monday.  8. Wound: continue post-op dressing. Remove dressing over the next 2-3 days. 9. Constipation: see meds; added Sorbitol prn

## 2012-01-18 NOTE — Progress Notes (Signed)
cbg 47 asymptomatic, patient to drink juice and crackers with peanut butter and dinner tray to arrive, repeat blod sugar 72 insulin held

## 2012-01-18 NOTE — Progress Notes (Signed)
Physical Therapy Note  Patient Details  Name: [name] MRN: [id number] Date of Birth: Jul 26, 1955 Today's Date: 01/18/2012  0900- 0955 (55 minutes) group Pain: no complaint of pain Pt participated in PT group session focused on bilateral LE AROM/strengthening including LT BKA exercises including ankle pumps (RT) , hip/knee flexion, SAQs, hip abduction, hip extension over bolster. Transfers SPT RW min to close SBA.   [name],JIM 01/18/2012, 7:58 AM

## 2012-01-19 ENCOUNTER — Inpatient Hospital Stay (HOSPITAL_COMMUNITY): Payer: Managed Care, Other (non HMO)

## 2012-01-19 ENCOUNTER — Inpatient Hospital Stay (HOSPITAL_COMMUNITY): Payer: Managed Care, Other (non HMO) | Admitting: Occupational Therapy

## 2012-01-19 DIAGNOSIS — S88119A Complete traumatic amputation at level between knee and ankle, unspecified lower leg, initial encounter: Secondary | ICD-10-CM

## 2012-01-19 DIAGNOSIS — Z5189 Encounter for other specified aftercare: Secondary | ICD-10-CM

## 2012-01-19 DIAGNOSIS — I739 Peripheral vascular disease, unspecified: Secondary | ICD-10-CM

## 2012-01-19 DIAGNOSIS — I2510 Atherosclerotic heart disease of native coronary artery without angina pectoris: Secondary | ICD-10-CM

## 2012-01-19 DIAGNOSIS — L98499 Non-pressure chronic ulcer of skin of other sites with unspecified severity: Secondary | ICD-10-CM

## 2012-01-19 DIAGNOSIS — E1165 Type 2 diabetes mellitus with hyperglycemia: Secondary | ICD-10-CM

## 2012-01-19 LAB — COMPREHENSIVE METABOLIC PANEL
ALT: 12 U/L (ref 0–35)
Alkaline Phosphatase: 118 U/L — ABNORMAL HIGH (ref 39–117)
BUN: 15 mg/dL (ref 6–23)
CO2: 26 mEq/L (ref 19–32)
Calcium: 9 mg/dL (ref 8.4–10.5)
GFR calc Af Amer: 82 mL/min — ABNORMAL LOW (ref >90)
GFR calc non Af Amer: 71 mL/min — ABNORMAL LOW (ref >90)
Glucose, Bld: 101 mg/dL — ABNORMAL HIGH (ref 70–99)
Potassium: 3.8 mEq/L (ref 3.5–5.1)
Total Protein: 6.5 g/dL (ref 6.0–8.3)

## 2012-01-19 LAB — GLUCOSE, CAPILLARY
Glucose-Capillary: 109 mg/dL — ABNORMAL HIGH (ref 70–99)
Glucose-Capillary: 81 mg/dL (ref 70–99)

## 2012-01-19 LAB — CBC WITH DIFFERENTIAL/PLATELET
Basophils Absolute: 0 10*3/uL (ref 0.0–0.1)
Basophils Relative: 0 % (ref 0–1)
HCT: 27.7 % — ABNORMAL LOW (ref 36.0–46.0)
Lymphocytes Relative: 26 % (ref 12–46)
MCHC: 31.8 g/dL (ref 30.0–36.0)
Monocytes Absolute: 0.3 10*3/uL (ref 0.1–1.0)
Neutro Abs: 4.6 10*3/uL (ref 1.7–7.7)
Neutrophils Relative %: 68 % (ref 43–77)
RDW: 14.3 % (ref 11.5–15.5)
WBC: 6.9 10*3/uL (ref 4.0–10.5)

## 2012-01-19 MED ORDER — INSULIN GLARGINE 100 UNIT/ML ~~LOC~~ SOLN
6.0000 [IU] | Freq: Every day | SUBCUTANEOUS | Status: DC
  Administered 2012-01-19 – 2012-01-20 (×2): 6 [IU] via SUBCUTANEOUS

## 2012-01-19 NOTE — Progress Notes (Signed)
Occupational Therapy Session Notes  Patient Details  Name: [name] MRN: [id number] Date of Birth: 1955-11-26  Today's Date: 01/19/2012  Short Term Goals: Week 1:  OT Short Term Goal 1 (Week 1): Patient will perform bathing at bed level at Supervision. OT Short Term Goal 2 (Week 1): Patient will perform dressing at bed level at Supervision. OT Short Term Goal 3 (Week 1): Patient will perform toileting at Supervision level. OT Short Term Goal 4 (Week 1): Patient will perform Toilet transfer at Supervision level with bedside commode. OT Short Term Goal 5 (Week 1): Patient will perform tub/shower transfer at Supervision level with tub transfer bench.  Skilled Therapeutic Interventions/Progress Updates:   Session #1 [phone] - 40 Minutes Individual Therapy No complaints of pain Patient found seated edge of bed eating breakfast. After breakfast patient completed UB/LB bathing & dressing at bed level in sit->stand position. Focused skilled intervention on overall activity tolerance/endurance, ADL retraining, sit/stands, dynamic standing balance/tolerance/endurance, edge of bed -> w/c stand pivot transfer, and grooming tasks seated at sink in w/c. At end of session left patient seated in w/c beside bed with call bell & phone within reach.   Session #2 1345 - 1430 - 45 Minutes Individual Therapy Patient with 8/10 complaints of pain in left residual limb, RN made aware Treatment emphasis on tub/shower transfer using tub transfer bench, w/c management, w/c mobility, and UE exercises using theraband. Educated patient on HEP with theraband and encouraged patient to work on exercises 2-3 times per day, patient verbalized understanding. At end of session left patient seated in w/c beside bed with call bell & phone within reach.   Precautions:  Precautions Precautions: Fall Required Braces or Orthoses: Other Brace/Splint Other Brace/Splint: Pt was given knee immobilizer. No orders stating what  leg or wearing schedule. Restrictions Weight Bearing Restrictions: Yes LLE Weight Bearing: Non weight bearing Other Position/Activity Restrictions: BKA  See FIM for current functional status  Moyinoluwa Dawe 01/19/2012, 7:53 AM

## 2012-01-19 NOTE — Evaluation (Signed)
Recreational Therapy Assessment and Plan  Patient Details  Name: [name] MRN: [id number] Date of Birth: Oct 10, 1955 Today's Date: 01/19/2012  Rehab Potential: Good ELOS: 10 days   Assessment Clinical Impression: Problem List:  Patient Active Problem List   Diagnosis   .  HYPERLIPIDEMIA   .  OBESITY   .  PERIPHERAL NEUROPATHY   .  HYPERTENSION   .  CAD, NATIVE VESSEL   .  LEFT VENTRICULAR FUNCTION, DECREASED   .  CVA (cerebral infarction)   .  GERD   .  HIATAL HERNIA   .  Irritable bowel syndrome   .  SYNCOPE   .  DM type 2, uncontrolled, with neuropathy   .  Syncope   .  Hypokalemia   .  HTN (hypertension)   .  UTI (lower urinary tract infection)   .  H/O: CVA (cardiovascular accident)   .  Normocytic anemia   .  Facial droop   .  Hyperglycemia   .  Mild dehydration   .  Numbness   .  TIA (transient ischemic attack)   .  Candiduria   .  Paresthesia of right arm and leg   .  Cellulitis of left lower extremity   .  Diabetic ulcer of left foot   .  Hypoglycemia associated with diabetes   .  Chest pain   .  Dizziness    Past Medical History:  Past Medical History   Diagnosis  Date   .  Coronary atherosclerosis of native coronary artery      non obstructive by cath in 2008 and 2010   .  Unspecified essential hypertension    .  Other and unspecified hyperlipidemia    .  Syncope and collapse      near-syncopal episode in November/2008   .  Unspecified cardiovascular disease      Decreased left-ventricular fxn   .  CVA (cerebral infarction)      Small right parietal noted incidentally 04/2007   .  Unspecified hereditary and idiopathic peripheral neuropathy    .  Obesity, unspecified    .  Esophageal reflux    .  History of diabetes mellitus, type II    .  Diaphragmatic hernia without mention of obstruction or gangrene    .  Irritable bowel syndrome    .  Morbid obesity    .  Diabetes mellitus    .  Cellulitis      left foot   .  Anginal pain    .   Stroke      2013-'no residual'    Past Surgical History:  Past Surgical History   Procedure  Date   .  Invasive electrophysiologic study  5/09     followed by insertion of an implantable loop recorder. S/p removal   .  Colonoscopy    .  Cholecystectomy    .  Multiple toe surgeries    .  Cardiac catheterization      LAD 30%, circumflex 50%, OM 75%, RI 60% with small branch 80%, dominant RCA 60%, EF 45-50%   .  I&d extremity  12/23/2011     Procedure: IRRIGATION AND DEBRIDEMENT EXTREMITY; Surgeon: Verlee Rossetti, MD; Location: [facility] OR; Service: Orthopedics; Laterality: Left; Left Foot   .  I&d extremity  12/26/2011     Procedure: IRRIGATION AND DEBRIDEMENT EXTREMITY; Surgeon: Toni Arthurs, MD; Location: [facility] OR; Service: Orthopedics; Laterality: Left; LEFT FOOT I&D WITH POSSIBLE WOUND VAC  APPLICATION, POSSIBLE LEFT FIRST RAY AMPUTATION   .  Amputation  12/30/2011     Procedure: AMPUTATION RAY; Surgeon: Toni Arthurs, MD; Location: [facility] OR; Service: Orthopedics; Laterality: Left; LEFT FIRST RAY AMPUTATION   .  Eye surgery      cataract   .  Amputation  01/13/2012     Procedure: AMPUTATION BELOW KNEE; Surgeon: Toni Arthurs, MD; Location: [facility] OR; Service: Orthopedics; Laterality: Left;    Assessment & Plan  Clinical Impression: PatiePatient is a 56 y.o. 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 Victorino Dike. Post op has had problems with pain control. Patient transferred to CIR on 01/16/2012.   Patient presents with decreased activity tolerance, decreased functional mobility, decreased balance, increased pain limiting pt's independence with leisure/community pursuits.   Leisure History/Participation Premorbid leisure interest/current participation: Games - Cross-word;Games - Bingo;Games - Cards;Sports - Basketball;Community - Public relations account executive (watch football, basketball,  tennis) Expression Interests: Singing;Dance;Music (Comment) (r and b) Other Leisure Interests: Cooking/Baking;Reading;Computer Acupuncturist) Leisure Participation Style: With Family/Friends Awareness of Community Resources: Good-identify 3 post discharge leisure resources Psychosocial / Spiritual Spiritual Interests: Church Patient agreeable to Pet Therapy: No Does patient have pets?: No Social interaction - Mood/Behavior: Cooperative Film/video editor for Education?: Yes Recreational Therapy Orientation Orientation -Reviewed with patient: Available activity resources Strengths/Weaknesses Patient Strengths/Abilities: Willingness to participate;Active premorbidly Patient weaknesses: Physical limitations  Plan Rec Therapy Plan Is patient appropriate for Therapeutic Recreation?: Yes Rehab Potential: Good Treatment times per week: min 1 time per week >20 min Estimated Length of Stay: 10 days TR Treatment/Interventions: Adaptive equipment instruction;1:1 session;Balance/vestibular training;Community reintegration;Patient/family education;Functional mobility training;Recreation/leisure participation;Therapeutic activities;Therapeutic exercise;Wheelchair propulsion/positioning  Recommendations for other services: None  Discharge Criteria: Patient will be discharged from TR if patient refuses treatment 3 consecutive times without medical reason.  If treatment goals not met, if there is a change in medical status, if patient makes no progress towards goals or if patient is discharged from hospital.  The above assessment, treatment plan, treatment alternatives and goals were discussed and mutually agreed upon: by patient  Ules Marsala 01/19/2012, 4:10 PM

## 2012-01-19 NOTE — Progress Notes (Signed)
Patient ID: [name], female   DOB: July 11, 1955, 56 y.o.   MRN: [id number]  Subjective/Complaints: When will my bandage be changed? No pain C/os Objective: Vital Signs: Blood pressure 126/73, pulse 88, temperature 98.2 F (36.8 C), temperature source Oral, resp. rate 19, height 5\' 6"  (1.676 m), weight 96.9 kg (213 lb 10 oz), SpO2 100.00%. No results found. Results for orders placed during the hospital encounter of 01/16/12 (from the past 72 hour(s))  GLUCOSE, CAPILLARY     Status: Abnormal   Collection Time   01/16/12 12:14 PM      Component Value Range Comment   Glucose-Capillary 116 (*) 70 - 99 mg/dL   GLUCOSE, CAPILLARY     Status: Abnormal   Collection Time   01/16/12  4:30 PM      Component Value Range Comment   Glucose-Capillary 106 (*) 70 - 99 mg/dL    Comment 1 Notify RN     GLUCOSE, CAPILLARY     Status: Normal   Collection Time   01/16/12  9:07 PM      Component Value Range Comment   Glucose-Capillary 94  70 - 99 mg/dL    Comment 1 Notify RN     GLUCOSE, CAPILLARY     Status: Abnormal   Collection Time   01/17/12  7:29 AM      Component Value Range Comment   Glucose-Capillary 69 (*) 70 - 99 mg/dL    Comment 1 Notify RN     GLUCOSE, CAPILLARY     Status: Normal   Collection Time   01/17/12  8:16 AM      Component Value Range Comment   Glucose-Capillary 77  70 - 99 mg/dL    Comment 1 Notify RN     GLUCOSE, CAPILLARY     Status: Abnormal   Collection Time   01/17/12 11:25 AM      Component Value Range Comment   Glucose-Capillary 106 (*) 70 - 99 mg/dL    Comment 1 Notify RN     GLUCOSE, CAPILLARY     Status: Abnormal   Collection Time   01/17/12  4:21 PM      Component Value Range Comment   Glucose-Capillary 57 (*) 70 - 99 mg/dL    Comment 1 Notify RN     GLUCOSE, CAPILLARY     Status: Abnormal   Collection Time   01/17/12  4:56 PM      Component Value Range Comment   Glucose-Capillary 58 (*) 70 - 99 mg/dL    Comment 1 Notify RN     GLUCOSE, CAPILLARY      Status: Normal   Collection Time   01/17/12  5:53 PM      Component Value Range Comment   Glucose-Capillary 88  70 - 99 mg/dL   GLUCOSE, CAPILLARY     Status: Abnormal   Collection Time   01/17/12  8:32 PM      Component Value Range Comment   Glucose-Capillary 102 (*) 70 - 99 mg/dL    Comment 1 Notify RN     GLUCOSE, CAPILLARY     Status: Abnormal   Collection Time   01/18/12  7:36 AM      Component Value Range Comment   Glucose-Capillary 67 (*) 70 - 99 mg/dL    Comment 1 Notify RN     GLUCOSE, CAPILLARY     Status: Normal   Collection Time   01/18/12  8:07 AM  Component Value Range Comment   Glucose-Capillary 95  70 - 99 mg/dL    Comment 1 Notify RN     GLUCOSE, CAPILLARY     Status: Abnormal   Collection Time   01/18/12 11:30 AM      Component Value Range Comment   Glucose-Capillary 118 (*) 70 - 99 mg/dL    Comment 1 Notify RN     GLUCOSE, CAPILLARY     Status: Abnormal   Collection Time   01/18/12  4:25 PM      Component Value Range Comment   Glucose-Capillary 49 (*) 70 - 99 mg/dL    Comment 1 Notify RN     GLUCOSE, CAPILLARY     Status: Normal   Collection Time   01/18/12  5:15 PM      Component Value Range Comment   Glucose-Capillary 72  70 - 99 mg/dL   GLUCOSE, CAPILLARY     Status: Abnormal   Collection Time   01/18/12  9:05 PM      Component Value Range Comment   Glucose-Capillary 153 (*) 70 - 99 mg/dL    Comment 1 Notify RN     COMPREHENSIVE METABOLIC PANEL     Status: Abnormal   Collection Time   01/19/12  5:00 AM      Component Value Range Comment   Sodium 137  135 - 145 mEq/L    Potassium 3.8  3.5 - 5.1 mEq/L    Chloride 100  96 - 112 mEq/L    CO2 26  19 - 32 mEq/L    Glucose, Bld 101 (*) 70 - 99 mg/dL    BUN 15  6 - 23 mg/dL    Creatinine, Ser 2.13  0.50 - 1.10 mg/dL    Calcium 9.0  8.4 - 08.6 mg/dL    Total Protein 6.5  6.0 - 8.3 g/dL    Albumin 2.5 (*) 3.5 - 5.2 g/dL    AST 11  0 - 37 U/L    ALT 12  0 - 35 U/L    Alkaline Phosphatase 118 (*) 39 - 117  U/L    Total Bilirubin 0.2 (*) 0.3 - 1.2 mg/dL    GFR calc non Af Amer 71 (*) >90 mL/min    GFR calc Af Amer 82 (*) >90 mL/min   CBC WITH DIFFERENTIAL     Status: Abnormal   Collection Time   01/19/12  5:00 AM      Component Value Range Comment   WBC 6.9  4.0 - 10.5 K/uL    RBC 3.29 (*) 3.87 - 5.11 MIL/uL    Hemoglobin 8.8 (*) 12.0 - 15.0 g/dL    HCT 57.8 (*) 46.9 - 46.0 %    MCV 84.2  78.0 - 100.0 fL    MCH 26.7  26.0 - 34.0 pg    MCHC 31.8  30.0 - 36.0 g/dL    RDW 62.9  52.8 - 41.3 %    Platelets 253  150 - 400 K/uL    Neutrophils Relative 68  43 - 77 %    Neutro Abs 4.6  1.7 - 7.7 K/uL    Lymphocytes Relative 26  12 - 46 %    Lymphs Abs 1.8  0.7 - 4.0 K/uL    Monocytes Relative 5  3 - 12 %    Monocytes Absolute 0.3  0.1 - 1.0 K/uL    Eosinophils Relative 2  0 - 5 %    Eosinophils Absolute 0.1  0.0 - 0.7 K/uL    Basophils Relative 0  0 - 1 %    Basophils Absolute 0.0  0.0 - 0.1 K/uL   GLUCOSE, CAPILLARY     Status: Abnormal   Collection Time   01/19/12  7:13 AM      Component Value Range Comment   Glucose-Capillary 109 (*) 70 - 99 mg/dL    Comment 1 Notify RN        HEENT: normal Cardio: RRR Resp: CTA B/L GI: BS positive Extremity:  Edema L stump Skin:   Wound ABD pads clean Neuro: Alert/Oriented, Anxious and Normal Motor Musc/Skel:  Other L BKA   Assessment/Plan: 1. Functional deficits secondary to L BKA  which require 3+ hours per day of interdisciplinary therapy in a comprehensive inpatient rehab setting. Physiatrist is providing close team supervision and 24 hour management of active medical problems listed below. Physiatrist and rehab team continue to assess barriers to discharge/monitor patient progress toward functional and medical goals. FIM: FIM - Bathing Bathing Steps Patient Completed: Right Arm;Chest;Left Arm;Abdomen;Front perineal area;Buttocks;Right upper leg;Left upper leg;Right lower leg (including foot) Bathing: 5: Supervision: Safety issues/verbal  cues  FIM - Upper Body Dressing/Undressing Upper body dressing/undressing steps patient completed: Thread/unthread right sleeve of pullover shirt/dresss;Thread/unthread left sleeve of pullover shirt/dress;Put head through opening of pull over shirt/dress;Pull shirt over trunk Upper body dressing/undressing: 5: Supervision: Safety issues/verbal cues FIM - Lower Body Dressing/Undressing Lower body dressing/undressing steps patient completed: Thread/unthread right pants leg;Thread/unthread left pants leg;Pull pants up/down;Don/Doff right sock Lower body dressing/undressing: 5: Supervision: Safety issues/verbal cues  FIM - Toileting Toileting steps completed by patient: Adjust clothing prior to toileting;Performs perineal hygiene;Adjust clothing after toileting Toileting Assistive Devices: Grab bar or rail for support Toileting: 4: Steadying assist  FIM - Diplomatic Services operational officer Devices: Grab bars;Walker Toilet Transfers: 4-To toilet/BSC: Min A (steadying Pt. > 75%);4-From toilet/BSC: Min A (steadying Pt. > 75%)  FIM - Bed/Chair Transfer Bed/Chair Transfer Assistive Devices: Bed rails;Arm rests;Walker Bed/Chair Transfer: 5: Supine > Sit: Supervision (verbal cues/safety issues);5: Bed > Chair or W/C: Supervision (verbal cues/safety issues)  FIM - Locomotion: Wheelchair Locomotion: Wheelchair: 2: Travels 50 - 149 ft with supervision, cueing or coaxing FIM - Locomotion: Ambulation Locomotion: Ambulation Assistive Devices: Designer, industrial/product Ambulation/Gait Assistance: 4: Min assist Locomotion: Ambulation: 1: Travels less than 50 ft with minimal assistance (Pt.>75%)  Comprehension Comprehension Mode: Auditory Comprehension: 7-Follows complex conversation/direction: With no assist  Expression Expression Mode: Verbal Expression: 7-Expresses complex ideas: With no assist  Social Interaction Social Interaction: 7-Interacts appropriately with others - No medications  needed.  Problem Solving Problem Solving: 7-Solves complex problems: Recognizes & self-corrects  Memory Memory: 7-Complete Independence: No helper  Medical Problem List and Plan:  1. DVT Prophylaxis/Anticoagulation: Pharmaceutical: Lovenox  2. Pain Management: reports oxycodone ineffective. Will start MS contin for consistent pain relief with prn MSIR.  3. Mood: seems to have good outlook. Will have LCSW follow up for formal evaluation.  4. Neuropsych: This patient is capable of making decisions on his/her own behalf.  5. DM type 2: Monitor with AC/HSCBG checks. Will continue amaryl, metformin and lantus. Continue 6 units for meal coverage and titrate lantus as needed for tighter control.  6. Hypokalemia: Likely dilutional. Will supplement and recheck on Monday.  7. ABLA: Add iron supplement. Will recheck on Monday.  8. Wound: continue post-op dressing. Remove dressing over the next 2-3 days.    LOS (Days) 3 A FACE TO FACE EVALUATION WAS PERFORMED  Claudette Laws  E 01/19/2012, 11:42 AM

## 2012-01-19 NOTE — Progress Notes (Signed)
Physical Therapy Session Note  Patient Details  Name: [name] MRN: [id number] Date of Birth: [birth year]  Today's Date: 01/19/2012 Time: 830-928 Time Calculation (min): 58 min  Short Term Goals: Week 1:  PT Short Term Goal 1 (Week 1): STG = LTG  Skilled Therapeutic Interventions/Progress Updates:    w/c propulsion on unit for endurance and strengthening x 60' before fatigued going to the gym. On the way back propelled the whole way x 4 rest breaks and encouragement to continue. Gait training with RW x 10' x 2 with seated rest break between with min A, cueing for using UEs more for improved efficiency. Education on desensitization techniques and general process of shaping/pre-prosthetic training. Transfer training squat/stand pivot technique with emphasis on w/c placement and not turning in complete 180 degrees with steady A/close supervision. Dynamic standing balance activity with 1 UE support on table top to reach outside BOS for rings for functional task simulation with close supervision/steady A when reaching behind. Toilet transfers in pt room, with pt managing clothing and hygiene with min A overall for balance.  Therapy Documentation Precautions:  Precautions Precautions: Fall Required Braces or Orthoses: Other Brace/Splint Other Brace/Splint: Pt was given knee immobilizer. No orders stating what leg or wearing schedule. Restrictions Weight Bearing Restrictions: Yes LLE Weight Bearing: Non weight bearing Other Position/Activity Restrictions: BKA Pain:  Denies pain.  See FIM for current functional status  Therapy/Group: Individual Therapy  [name] [facility] 01/19/2012, 9:00 AM

## 2012-01-19 NOTE — Progress Notes (Signed)
Social Work Assessment and Plan Social Work Assessment and Plan  Patient Details  Name: [name] MRN: [id number] Date of Birth: December 27, 1955  Today's Date: 01/19/2012  Problem List:  Patient Active Problem List  Diagnosis  . HYPERLIPIDEMIA  . OBESITY  . PERIPHERAL NEUROPATHY  . HYPERTENSION  . CAD, NATIVE VESSEL  . LEFT VENTRICULAR FUNCTION, DECREASED  . CVA (cerebral infarction)  . GERD  . HIATAL HERNIA  . Irritable bowel syndrome  . SYNCOPE  . DM type 2, uncontrolled, with neuropathy  . Syncope  . Hypokalemia  . HTN (hypertension)  . UTI (lower urinary tract infection)  . H/O: CVA (cardiovascular accident)  . Normocytic anemia  . Facial droop  . Hyperglycemia  . Mild dehydration  . Numbness  . TIA (transient ischemic attack)  . Candiduria  . Paresthesia of right arm and leg  . Cellulitis of left lower extremity  . Diabetic ulcer of left foot  . Hypoglycemia associated with diabetes  . Chest pain  . Dizziness  . Unilateral complete BKA   Past Medical History:  Past Medical History  Diagnosis Date  . Coronary atherosclerosis of native coronary artery     non obstructive by cath in 2008 and 2010  . Unspecified essential hypertension   . Other and unspecified hyperlipidemia   . Syncope and collapse     near-syncopal episode in November/2008  . Unspecified cardiovascular disease     Decreased left-ventricular fxn  . 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 Unspecified hereditary and idiopathic peripheral neuropathy   . Obesity, unspecified   . Esophageal reflux   . History of diabetes mellitus, type II   . Diaphragmatic hernia without mention of obstruction or gangrene   . Irritable bowel syndrome   . Morbid obesity   . Diabetes mellitus   . Cellulitis     left foot  . Anginal pain   . Stroke     2013-'no residual'   Past Surgical History:  Past Surgical History  Procedure Date  . Invasive  electrophysiologic study 5/09    followed by insertion of an implantable loop recorder. S/p removal   . Colonoscopy   . Cholecystectomy   . Multiple toe surgeries   . Cardiac catheterization     LAD 30%, circumflex 50%, OM 75%, RI 60% with small branch 80%, dominant RCA 60%, EF 45-50%  . I&d extremity 12/23/2011    Procedure: IRRIGATION AND DEBRIDEMENT EXTREMITY;  Surgeon: Verlee Rossetti, MD;  Location: [facility] OR;  Service: Orthopedics;  Laterality: Left;  Left Foot  . I&d extremity 12/26/2011    Procedure: IRRIGATION AND DEBRIDEMENT EXTREMITY;  Surgeon: Toni Arthurs, MD;  Location: [facility] OR;  Service: Orthopedics;  Laterality: Left;  LEFT FOOT I&D WITH POSSIBLE WOUND VAC APPLICATION, POSSIBLE LEFT FIRST RAY AMPUTATION  . Amputation 12/30/2011    Procedure: AMPUTATION RAY;  Surgeon: Toni Arthurs, MD;  Location: [facility] OR;  Service: Orthopedics;  Laterality: Left;  LEFT FIRST RAY AMPUTATION  . Eye surgery     cataract  . Amputation 01/13/2012    Procedure: AMPUTATION BELOW KNEE;  Surgeon: Toni Arthurs, MD;  Location: [facility] OR;  Service: Orthopedics;  Laterality: Left;   Social History:  reports that she has been smoking Cigarettes.  She has a 10 pack-year smoking history. She has never used smokeless tobacco. She reports that she drinks about 2.4 ounces of alcohol per week. She reports that she does not use illicit  drugs.  Family / Support Systems Marital Status: Single Patient Roles: Other (Comment) (Employee, Aunt, Sister) Other Supports: Irma Newness  [phone]  De Nurse  [phone] Anticipated Caregiver: Family will check in daily with pt-niece and nephew Ability/Limitations of Caregiver: Only has intermittent assistance Caregiver Availability: Intermittent Family Dynamics: Close with her sister and sister's children, see each other daily.  Pt also has her father Young Berry is supportive  Social History Preferred language: English Religion: Non-Denominational Cultural Background: No  issues Education: Some college Read: Yes Write: Yes Employment Status: Employed Name of Employer: [employer] Return to Work Plans: Plans to return as soon as possible Fish farm manager Issues: No issues Guardian/Conservator: None-according to MD pt is capable of making her own decisions   Abuse/Neglect Physical Abuse: Denies Verbal Abuse: Denies Sexual Abuse: Denies Exploitation of patient/patient's resources: Denies Self-Neglect: Denies  Emotional Status Pt's affect, behavior adn adjustment status: Pt is motivated to get home.  She reports: " It had to be done, I've accepted that."  Pt is ready to move forward and heal and get back to her life.  She has a good attitude toward all that has happened to her. Recent Psychosocial Issues: Other medical issues Pyschiatric History: No issues-depression screen deferred due to pt coping well with amputation and is future oriented.  Will continue to monitor her coping while here. Substance Abuse History: No issues  Patient / Family Perceptions, Expectations & Goals Pt/Family understanding of illness & functional limitations: Pt can explain her ampuatation and that MD tried to save her leg, but it just got too bad.  Pt is motivated to imporve and return home she has been in the hospital too long. Premorbid pt/family roles/activities: Aunt, Sister, Employee, Church member, etc Anticipated changes in roles/activities/participation: Resume at discharge Pt/family expectations/goals: Pt states: " I want to be able to move around with a walker by myself. "  She is confident she will get there.  Community [employer] Agencies: None Premorbid Home Care/DME Agencies: Other (Comment) (Had in the past) Transportation available at discharge: Family members Resource referrals recommended: Support group (specify) (Amputee Support group)  Discharge Planning Living Arrangements: Alone Support Systems: Other  relatives;Friends/neighbors;Church/faith community Type of Residence: Private residence Insurance Resources: Media planner (specify) Counselling psychologist) Financial Resources: Employment Financial Screen Referred: No Living Expenses: Own Money Management: Patient Do you have any problems obtaining your medications?: No Home Management: Pt and family members help Patient/Family Preliminary Plans: Return home with family coming in daily to check on and assist.  She does not have 24 hour care so needs to be safe to transfer or ambulate short distances by herself. Social Work Anticipated Follow Up Needs: HH/OP;Support Group DC Planning Additional Notes/Comments: Short length of stay  Clinical Impression Pleasant female who has been here numerous times before and knows the routine.  Very motivated to improve and get back home.  Will be short length of stay and will do well here.  Lucy Chris 01/19/2012, 9:57 AM

## 2012-01-19 NOTE — Progress Notes (Signed)
Physical Therapy Session Note  Patient Details  Name: [name] MRN: [id number] Date of Birth: 02-16-1956  Today's Date: 01/19/2012 Time: 4010-2725 Time Calculation (min): 45 min  Short Term Goals: Week 1:  PT Short Term Goal 1 (Week 1): STG = LTG  Skilled Therapeutic Interventions/Progress Updates:    Focus on carryover of transfer technique with squat pivot method reviewed from this AM, requires cues for getting legrests out of the way completely and closer positioning of w/c. Completed transfer to ADL apartment bed (reports this bed is a little higher than her bed at home) steady A. Bed mobility mod I. Issued LE HEP for residual limb strengthening for supine hip flexion/knee flexion/extension, sidelying hip extension and abduction, and seated isometric adduction all 3 sets of 10 reps each exercise. Handout provided and will review again with pt. Short distance gait prior to pt reporting fatigue x 10' x 2 with min A overall with RW. Good participation.   Therapy Documentation Precautions:  Precautions Precautions: Fall Required Braces or Orthoses: Other Brace/Splint Other Brace/Splint: Pt was given knee immobilizer. No orders stating what leg or wearing schedule. Restrictions Weight Bearing Restrictions: Yes LLE Weight Bearing: Non weight bearing LLE Partial Weight Bearing Percentage or Pounds:  (Left BKA) Other Position/Activity Restrictions: BKA    Pain: No complaints. See FIM for current functional status  Therapy/Group: Individual Therapy  [name] [facility] 01/19/2012, 3:43 PM

## 2012-01-19 NOTE — Care Management Note (Signed)
Inpatient Rehabilitation Center Individual Statement of Services  Patient Name:  [name]  Date:  01/19/2012  Welcome to the Inpatient Rehabilitation Center.  Our goal is to provide you with an individualized program based on your diagnosis and situation, designed to meet your specific needs.  With this comprehensive rehabilitation program, you will be expected to participate in at least 3 hours of rehabilitation therapies Monday-Friday, with modified therapy programming on the weekends.  Your rehabilitation program will include the following services:  Physical Therapy (PT), Occupational Therapy (OT), 24 hour per day rehabilitation nursing, Therapeutic Recreaction (TR), Case Management (RN and Child psychotherapist), Rehabilitation Medicine, Nutrition Services and Pharmacy Services  Weekly team conferences will be held on Tuesday to discuss your progress.  Your RN Case Designer, television/film set will talk with you frequently to get your input and to update you on team discussions.  Team conferences with you and your family in attendance may also be held.  Expected length of stay: 7-10 days  Overall anticipated outcome:mod/i-supervision level  Depending on your progress and recovery, your program may change.  Your RN Case Estate agent will coordinate services and will keep you informed of any changes.  Your RN Sports coach and SW names and contact numbers are listed  below.  The following services may also be recommended but are not provided by the Inpatient Rehabilitation Center:   Driving Evaluations  Home Health Rehabiltiation Services  Outpatient Rehabilitatation [facility]  Vocational Rehabilitation   Arrangements will be made to provide these services after discharge if needed.  Arrangements include referral to agencies that provide these services.  Your insurance has been verified to be:  [place] Your primary doctor is:  Dr Earlie Lou  Pertinent information will be  shared with your doctor and your insurance company.   Social Worker:  Dossie Der, [place] [phone]  Information discussed with and copy given to patient by: Lucy Chris, 01/19/2012, 9:05 AM

## 2012-01-20 ENCOUNTER — Inpatient Hospital Stay (HOSPITAL_COMMUNITY): Payer: Managed Care, Other (non HMO)

## 2012-01-20 ENCOUNTER — Inpatient Hospital Stay (HOSPITAL_COMMUNITY): Payer: Managed Care, Other (non HMO) | Admitting: Occupational Therapy

## 2012-01-20 DIAGNOSIS — I2510 Atherosclerotic heart disease of native coronary artery without angina pectoris: Secondary | ICD-10-CM

## 2012-01-20 DIAGNOSIS — S88119A Complete traumatic amputation at level between knee and ankle, unspecified lower leg, initial encounter: Secondary | ICD-10-CM

## 2012-01-20 DIAGNOSIS — I739 Peripheral vascular disease, unspecified: Secondary | ICD-10-CM

## 2012-01-20 DIAGNOSIS — L98499 Non-pressure chronic ulcer of skin of other sites with unspecified severity: Secondary | ICD-10-CM

## 2012-01-20 DIAGNOSIS — Z5189 Encounter for other specified aftercare: Secondary | ICD-10-CM

## 2012-01-20 DIAGNOSIS — E1165 Type 2 diabetes mellitus with hyperglycemia: Secondary | ICD-10-CM

## 2012-01-20 LAB — GLUCOSE, CAPILLARY
Glucose-Capillary: 133 mg/dL — ABNORMAL HIGH (ref 70–99)
Glucose-Capillary: 150 mg/dL — ABNORMAL HIGH (ref 70–99)
Glucose-Capillary: 58 mg/dL — ABNORMAL LOW (ref 70–99)
Glucose-Capillary: 66 mg/dL — ABNORMAL LOW (ref 70–99)
Glucose-Capillary: 69 mg/dL — ABNORMAL LOW (ref 70–99)
Glucose-Capillary: 77 mg/dL (ref 70–99)
Glucose-Capillary: 96 mg/dL (ref 70–99)

## 2012-01-20 MED ORDER — GLUCOSE 40 % PO GEL
ORAL | Status: AC
  Administered 2012-01-20: 37.5 g via ORAL
  Filled 2012-01-20: qty 1

## 2012-01-20 MED ORDER — INSULIN GLARGINE 100 UNIT/ML ~~LOC~~ SOLN: 6.0000 [IU] | Freq: Every day | SUBCUTANEOUS | Status: DC

## 2012-01-20 MED ORDER — INSULIN ASPART 100 UNIT/ML ~~LOC~~ SOLN
6.0000 [IU] | Freq: Two times a day (BID) | SUBCUTANEOUS | Status: DC
  Administered 2012-01-20: 6 [IU] via SUBCUTANEOUS

## 2012-01-20 MED ORDER — GLUCOSE 40 % PO GEL
1.0000 | ORAL | Status: DC | PRN
  Administered 2012-01-20: 37.5 g via ORAL

## 2012-01-20 MED ORDER — GLUCOSE 40 % PO GEL
ORAL | Status: AC
  Administered 2012-01-20: 37.5 g
  Filled 2012-01-20: qty 1

## 2012-01-20 MED ORDER — GLUCOSE-VITAMIN C 4-6 GM-MG PO CHEW: 4.0000 | CHEWABLE_TABLET | ORAL | Status: DC | PRN

## 2012-01-20 NOTE — Progress Notes (Signed)
Patient participating well in therapies today. Complaints of pain to LLE, pain mediations given with some relief. MD notified of hypoglycemic episodes, new orders given. Otherwise no other complaints continue plan of care.

## 2012-01-20 NOTE — Progress Notes (Signed)
Physical Therapy Session Note  Patient Details  Name: [name] MRN: [id number] Date of Birth: July 28, 1955  Today's Date: 01/20/2012 Time: 0900-0955 Time Calculation (min): 55 min  Short Term Goals: Week 1:  PT Short Term Goal 1 (Week 1): STG = LTG  Skilled Therapeutic Interventions/Progress Updates:  Patient performed HEP 3 sets x 10 reps each with minimal cueing. Patient practiced multiple basic transfers wheelchair to and from mat including wheelchair set up with locking brakes and removing footrests/amputee pad. Patient requires cueing about 25% of the time.  Patient requires only supervision for actual squat pivot transfer. Patient attempted to ambulate with RW  And min assist but said she was too tired after about 4 ft. Left residual limb was rewrapped. Purpose for wrapping and elevation on amputee pad was explained to patient.   Therapy Documentation Precautions:  Precautions Precautions: Fall Required Braces or Orthoses: Other Brace/Splint Other Brace/Splint: Pt was given knee immobilizer. No orders stating what leg or wearing schedule. Restrictions Weight Bearing Restrictions: Yes LLE Weight Bearing: Non weight bearing LLE Partial Weight Bearing Percentage or Pounds:  (left bka) Other Position/Activity Restrictions: BKA General:   Vital Signs: Therapy Vitals Temp: 97.9 F (36.6 C) Temp src: Oral Pulse Rate: 95  Resp: 20  BP: 163/72 mmHg Patient Position, if appropriate: Lying Oxygen Therapy SpO2: 99 % O2 Device: None (Room air) Pain: Pain Assessment Pain Assessment: 0-10 Pain Score:   8 Pain Type: Surgical pain Pain Location: Other (Comment) (residual limb) Pain Orientation: Left Pain Descriptors: Aching Pain Frequency: Intermittent Pain Onset: Gradual Patients Stated Pain Goal: 4 Pain Intervention(s): Medication (See eMAR) Multiple Pain Sites: No  Locomotion : Ambulation Ambulation/Gait Assistance: 4: Min assist   See FIM for current functional  status  Therapy/Group: Individual Therapy  Arelia Longest M 01/20/2012, 9:59 AM

## 2012-01-20 NOTE — Patient Care Conference (Signed)
Inpatient RehabilitationTeam Conference Note Date: 01/20/2012   Time: 2:32 PM    Patient Name: [name]      Medical Record Number: [id number]  Date of Birth: 1956-02-15 Sex: Female         Room/Bed: 4153/4153-01 Payor Info: Payor: CIG[employer]  Plan: CIGNA MANAGED  Product Type: *No Product type*     Admitting Diagnosis: LT BKA  Admit Date/Time:  01/16/2012 12:04 PM Admission Comments: No comment available   Primary Diagnosis:  Unilateral complete BKA Principal Problem: Unilateral complete BKA  Patient Active Problem List   Diagnosis Date Noted  . Unilateral complete BKA 01/16/2012  . Chest pain 01/07/2012  . Dizziness 01/07/2012  . Hypoglycemia associated with diabetes 12/28/2011  . Diabetic ulcer of left foot 12/24/2011  . Cellulitis of left lower extremity 12/23/2011  . Paresthesia of right arm and leg 12/17/2011  . Candiduria 12/16/2011  . Facial droop 12/15/2011  . Hyperglycemia 12/15/2011  . Mild dehydration 12/15/2011  . Numbness 12/15/2011  . TIA (transient ischemic attack) 12/15/2011  . Syncope 07/02/2011  . Hypokalemia 07/02/2011  . HTN (hypertension) 07/02/2011  . UTI (lower urinary tract infection) 07/02/2011  . H/O: CVA (cardiovascular accident) 07/02/2011  . Normocytic anemia 07/02/2011  . PERIPHERAL NEUROPATHY 04/02/2009  . HYPERTENSION 04/02/2009  . CAD, NATIVE VESSEL 04/02/2009  . HIATAL HERNIA 04/02/2009  . Irritable bowel syndrome 04/02/2009  . HYPERLIPIDEMIA 05/30/2008  . OBESITY 05/30/2008  . LEFT VENTRICULAR FUNCTION, DECREASED 05/30/2008  . CVA (cerebral infarction) 05/30/2008  . GERD 05/30/2008  . SYNCOPE 05/30/2008  . DM type 2, uncontrolled, with neuropathy 05/30/2008    Expected Discharge Date: Expected Discharge Date: 01/27/12  Team Members Present: Physician: Dr. Faith Rogue Social Worker Present: Dossie Der, LCSW Nurse Present: Other (comment) Charisse March Hicks-RN) PT Present: [name], PT OT Present: Edwin Cap, OT SLP  Present: Feliberto Gottron, SLP Other (Discipline and Name): Charolette Child Coordinator     Current Status/Progress Goal Weekly Team Focus  Medical   left bka, hx of cad,  pain control, increase endurance  wound care   Bowel/Bladder   continent of bowel and Bladder  Continent of Bowel and Bladder with toileting  Up to BR for all tioleting   Swallow/Nutrition/ Hydration   CMM  to eat 75% of all trays      ADL's   overall steady assist -> supervision  Overall supervision -> modified independent  ADL retraining, UE strengthening, dynamic standing, functional activity tolerance/endurance   Mobility   minA overall  mod I overall w/c level. short distance gait   endurance, gait training, functional strengthening, dynamic standing balance   Communication   can make her needs known  Communicate needs to staff and family      Safety/Cognition/ Behavioral Observations  no unsafe behaviors  no falls with injury      Pain   C/O of left leg pain, MS Contin 15 mg po q 12 hours, MS immediate relief 15 mg po q 4 hrs and prn  pain 4 or less on scale of 1-10      Skin   Left BKA  no breakdown or infection         *See Interdisciplinary Assessment and Plan and progress notes for long and short-term goals  Barriers to Discharge: endurance,    Possible Resolutions to Barriers:  edurance training, adaptive techniques    Discharge Planning/Teaching Needs:  Pt going to her home with family checking in and out on her.  Needs to  be mod/i elvel to return home      Team Discussion:  Pain controlled-working on endurance.  Doing well in therapies.  Take dressing off tomorrow to check/look at  Revisions to Treatment Plan:  None   Continued Need for Acute Rehabilitation Level of Care: The patient requires daily medical management by a physician with specialized training in physical medicine and rehabilitation for the following conditions: Daily direction of a multidisciplinary physical rehabilitation  program to ensure safe treatment while eliciting the highest outcome that is of practical value to the patient.: Yes Daily medical management of patient stability for increased activity during participation in an intensive rehabilitation regime.: Yes Daily analysis of laboratory values and/or radiology reports with any subsequent need for medication adjustment of medical intervention for : Post surgical problems;Cardiac problems  Elma Shands, Lemar Livings 01/20/2012, 2:32 PM

## 2012-01-20 NOTE — Progress Notes (Signed)
Occupational Therapy Session Notes  Patient Details  Name: [name] MRN: [id number] Date of Birth: 12/17/1955  Today's Date: 01/20/2012  Short Term Goals: Week 1:  OT Short Term Goal 1 (Week 1): Patient will perform bathing at bed level at Supervision. OT Short Term Goal 2 (Week 1): Patient will perform dressing at bed level at Supervision. OT Short Term Goal 3 (Week 1): Patient will perform toileting at Supervision level. OT Short Term Goal 4 (Week 1): Patient will perform Toilet transfer at Supervision level with bedside commode. OT Short Term Goal 5 (Week 1): Patient will perform tub/shower transfer at Supervision level with tub transfer bench.  Skilled Therapeutic Interventions/Progress Updates:   Session #1 [phone] - 45 Minutes Individual Therapy Patient with 8/10 complaints of pain in left residual limb, RN made aware Patient found seated on edge of bed. Engaged in edge of bed -> w/c stand pivot transfer with steady assist, patient preferred not to move left leg rest; however unsafe set-up so therapist explained importance of moving leg rest before transfer. Patient then engaged in toilet transfer and toileting in bathroom with close supervision. ADL retraining was then performed at sink level in sit -> stand position. Patient gathered necessary clothing for dressing and stood at dresser to pull pants up to waist, therapist explained importance of standing at sink side/counter top or using rolling walker for some UE support while pulling pants up to waist for safety. Patient demonstrates some unsafe behaviors during ADL and requires supervision for problem solving for safety. After ADL therapist propelled patient to therapy gym for UE exercises using SCIFIT machine and w/c pushups. At end of session left patient seated in w/c beside bed with call bell & phone within reach.  Session #2 1330-1400 - 30 Minutes Individual Therapy Patient with 7/10 complaints of pain; RN made  aware Patient found supine in bed asleep. Therapist woke patient and patient sat edge of bed for stand pivot transfer -> w/c. During transfer focused on safety with transfer and safety with w/c management. Patient then worked on self propelling w/c -> ADL apartment kitchen and in/out of doorways for therapeutic activity. At end of session left patient seated in w/c beside bed with call bell & phone within reach.   Precautions:  Precautions Precautions: Fall Required Braces or Orthoses: Other Brace/Splint Other Brace/Splint: Pt was given knee immobilizer. No orders stating what leg or wearing schedule. Restrictions Weight Bearing Restrictions: Yes LLE Weight Bearing: Non weight bearing LLE Partial Weight Bearing Percentage or Pounds:  (left bka) Other Position/Activity Restrictions: BKA  See FIM for current functional status  Damarko Stitely 01/20/2012, 8:16 AM

## 2012-01-20 NOTE — Significant Event (Signed)
Hypoglycemic Event  CBG: 58  Treatment: 15 GM carbohydrate snack and 15 GM gel  Symptoms: None  Follow-up CBG: Time:1828 CBG Result:84  Possible Reasons for Event: Unknown  Comments/MD notified:patient asymptomatic, blood sugar stabilized    Kyler Germer, Phill Mutter  Remember to initiate Hypoglycemia Order Set & complete

## 2012-01-20 NOTE — Significant Event (Signed)
Hypoglycemic Event  CBG: 67   Treatment: 15 GM carbohydrate snack  Symptoms: None  Follow-up CBG: Time:1723 CBG Result:69  Possible Reasons for Event: Unknown  Comments/MD notified:Hypoglycemic protocol re-initiated    Chirstina Haan, Phill Mutter  Remember to initiate Hypoglycemia Order Set & complete

## 2012-01-20 NOTE — Progress Notes (Signed)
Occupational Therapy Session Note  Patient Details  Name: GILDA ABBOUD MRN: [id number] Date of Birth: 1955/10/08  Today's Date: 01/20/2012 Time: 1030-1130 Time Calculation (min): 60 min  Short Term Goals: Week 1:  OT Short Term Goal 1 (Week 1): Patient will perform bathing at bed level at Supervision. OT Short Term Goal 2 (Week 1): Patient will perform dressing at bed level at Supervision. OT Short Term Goal 3 (Week 1): Patient will perform toileting at Supervision level. OT Short Term Goal 4 (Week 1): Patient will perform Toilet transfer at Supervision level with bedside commode. OT Short Term Goal 5 (Week 1): Patient will perform tub/shower transfer at Supervision level with tub transfer bench.  Skilled Therapeutic Interventions/Progress Updates:    Worked on simple meal prep in the kitchen using the RW.  Pt able to gather all items from wheelchair level.  Did perform some standing to reach items up in the cabinet with supervision.  Pt needed min instructional cueing to position wheelchair facing the counter before standing.  Also needed mod instructional cueing to make sure that she locked the brakes when reaching in the oven or lower cabinets for her muffin pans.  Able to utilize the counter top to help transport items back and forth as well.    Therapy Documentation Precautions:  Precautions Precautions: Fall Required Braces or Orthoses: Other Brace/Splint Other Brace/Splint: Pt was given knee immobilizer. No orders stating what leg or wearing schedule. Restrictions Weight Bearing Restrictions: Yes LLE Weight Bearing: Non weight bearing LLE Partial Weight Bearing Percentage or Pounds:  (left bka) Other Position/Activity Restrictions: BKA  Pain: Pain Assessment Pain Assessment: 0-10 Pain Score:   6 Pain Type: Surgical pain Pain Location: Leg Pain Orientation: Left Pain Descriptors: Aching Pain Frequency: Intermittent Pain Onset: Gradual Pain Intervention(s): Medication  (See eMAR);Repositioned  See FIM for current functional status  Therapy/Group: Individual Therapy  Mona Ayars OTR/L 01/20/2012, 12:22 PM

## 2012-01-20 NOTE — Progress Notes (Signed)
Patient ID: [name], female   DOB: 1955/05/24, 56 y.o.   MRN: [id number]  Subjective/Complaints: Slept well. Pain meds help pain. Good night.  No pain C/os Objective: Vital Signs: Blood pressure 163/72, pulse 95, temperature 97.9 F (36.6 C), temperature source Oral, resp. rate 20, height 5\' 6"  (1.676 m), weight 96.9 kg (213 lb 10 oz), SpO2 99.00%. No results found. Results for orders placed during the hospital encounter of 01/16/12 (from the past 72 hour(s))  GLUCOSE, CAPILLARY     Status: Abnormal   Collection Time   01/17/12 11:25 AM      Component Value Range Comment   Glucose-Capillary 106 (*) 70 - 99 mg/dL    Comment 1 Notify RN     GLUCOSE, CAPILLARY     Status: Abnormal   Collection Time   01/17/12  4:21 PM      Component Value Range Comment   Glucose-Capillary 57 (*) 70 - 99 mg/dL    Comment 1 Notify RN     GLUCOSE, CAPILLARY     Status: Abnormal   Collection Time   01/17/12  4:56 PM      Component Value Range Comment   Glucose-Capillary 58 (*) 70 - 99 mg/dL    Comment 1 Notify RN     GLUCOSE, CAPILLARY     Status: Normal   Collection Time   01/17/12  5:53 PM      Component Value Range Comment   Glucose-Capillary 88  70 - 99 mg/dL   GLUCOSE, CAPILLARY     Status: Abnormal   Collection Time   01/17/12  8:32 PM      Component Value Range Comment   Glucose-Capillary 102 (*) 70 - 99 mg/dL    Comment 1 Notify RN     GLUCOSE, CAPILLARY     Status: Abnormal   Collection Time   01/18/12  7:36 AM      Component Value Range Comment   Glucose-Capillary 67 (*) 70 - 99 mg/dL    Comment 1 Notify RN     GLUCOSE, CAPILLARY     Status: Normal   Collection Time   01/18/12  8:07 AM      Component Value Range Comment   Glucose-Capillary 95  70 - 99 mg/dL    Comment 1 Notify RN     GLUCOSE, CAPILLARY     Status: Abnormal   Collection Time   01/18/12 11:30 AM      Component Value Range Comment   Glucose-Capillary 118 (*) 70 - 99 mg/dL    Comment 1 Notify RN     GLUCOSE,  CAPILLARY     Status: Abnormal   Collection Time   01/18/12  4:25 PM      Component Value Range Comment   Glucose-Capillary 49 (*) 70 - 99 mg/dL    Comment 1 Notify RN     GLUCOSE, CAPILLARY     Status: Normal   Collection Time   01/18/12  5:15 PM      Component Value Range Comment   Glucose-Capillary 72  70 - 99 mg/dL   GLUCOSE, CAPILLARY     Status: Abnormal   Collection Time   01/18/12  9:05 PM      Component Value Range Comment   Glucose-Capillary 153 (*) 70 - 99 mg/dL    Comment 1 Notify RN     COMPREHENSIVE METABOLIC PANEL     Status: Abnormal   Collection Time   01/19/12  5:00 AM  Component Value Range Comment   Sodium 137  135 - 145 mEq/L    Potassium 3.8  3.5 - 5.1 mEq/L    Chloride 100  96 - 112 mEq/L    CO2 26  19 - 32 mEq/L    Glucose, Bld 101 (*) 70 - 99 mg/dL    BUN 15  6 - 23 mg/dL    Creatinine, Ser 4.54  0.50 - 1.10 mg/dL    Calcium 9.0  8.4 - 09.8 mg/dL    Total Protein 6.5  6.0 - 8.3 g/dL    Albumin 2.5 (*) 3.5 - 5.2 g/dL    AST 11  0 - 37 U/L    ALT 12  0 - 35 U/L    Alkaline Phosphatase 118 (*) 39 - 117 U/L    Total Bilirubin 0.2 (*) 0.3 - 1.2 mg/dL    GFR calc non Af Amer 71 (*) >90 mL/min    GFR calc Af Amer 82 (*) >90 mL/min   CBC WITH DIFFERENTIAL     Status: Abnormal   Collection Time   01/19/12  5:00 AM      Component Value Range Comment   WBC 6.9  4.0 - 10.5 K/uL    RBC 3.29 (*) 3.87 - 5.11 MIL/uL    Hemoglobin 8.8 (*) 12.0 - 15.0 g/dL    HCT 11.9 (*) 14.7 - 46.0 %    MCV 84.2  78.0 - 100.0 fL    MCH 26.7  26.0 - 34.0 pg    MCHC 31.8  30.0 - 36.0 g/dL    RDW 82.9  56.2 - 13.0 %    Platelets 253  150 - 400 K/uL    Neutrophils Relative 68  43 - 77 %    Neutro Abs 4.6  1.7 - 7.7 K/uL    Lymphocytes Relative 26  12 - 46 %    Lymphs Abs 1.8  0.7 - 4.0 K/uL    Monocytes Relative 5  3 - 12 %    Monocytes Absolute 0.3  0.1 - 1.0 K/uL    Eosinophils Relative 2  0 - 5 %    Eosinophils Absolute 0.1  0.0 - 0.7 K/uL    Basophils Relative 0  0 - 1 %      Basophils Absolute 0.0  0.0 - 0.1 K/uL   GLUCOSE, CAPILLARY     Status: Abnormal   Collection Time   01/19/12  7:13 AM      Component Value Range Comment   Glucose-Capillary 109 (*) 70 - 99 mg/dL    Comment 1 Notify RN     GLUCOSE, CAPILLARY     Status: Normal   Collection Time   01/19/12 11:27 AM      Component Value Range Comment   Glucose-Capillary 99  70 - 99 mg/dL    Comment 1 Notify RN     GLUCOSE, CAPILLARY     Status: Normal   Collection Time   01/19/12  4:50 PM      Component Value Range Comment   Glucose-Capillary 84  70 - 99 mg/dL   GLUCOSE, CAPILLARY     Status: Abnormal   Collection Time   01/19/12  9:12 PM      Component Value Range Comment   Glucose-Capillary 51 (*) 70 - 99 mg/dL   GLUCOSE, CAPILLARY     Status: Normal   Collection Time   01/19/12  9:26 PM      Component Value Range Comment  Glucose-Capillary 72  70 - 99 mg/dL   GLUCOSE, CAPILLARY     Status: Normal   Collection Time   01/19/12  9:40 PM      Component Value Range Comment   Glucose-Capillary 81  70 - 99 mg/dL   GLUCOSE, CAPILLARY     Status: Abnormal   Collection Time   01/20/12 12:53 AM      Component Value Range Comment   Glucose-Capillary 150 (*) 70 - 99 mg/dL   GLUCOSE, CAPILLARY     Status: Normal   Collection Time   01/20/12  7:22 AM      Component Value Range Comment   Glucose-Capillary 96  70 - 99 mg/dL    Comment 1 Notify RN        HEENT: normal Cardio: RRR Resp: CTA B/L GI: BS positive Extremity:  Edema L stump Skin:   Wound ABD pads clean Neuro: Alert/Oriented, Anxious and Normal Motor Musc/Skel:  Other L BKA Wound remains dress. No overt drainage.   Assessment/Plan: 1. Functional deficits secondary to L BKA  which require 3+ hours per day of interdisciplinary therapy in a comprehensive inpatient rehab setting. Physiatrist is providing close team supervision and 24 hour management of active medical problems listed below. Physiatrist and rehab team continue to assess barriers to  discharge/monitor patient progress toward functional and medical goals. FIM: FIM - Bathing Bathing Steps Patient Completed: Chest;Right Arm;Left Arm;Abdomen;Front perineal area;Buttocks;Right upper leg;Left upper leg;Right lower leg (including foot) Bathing: 5: Supervision: Safety issues/verbal cues  FIM - Upper Body Dressing/Undressing Upper body dressing/undressing steps patient completed: Thread/unthread right bra strap;Thread/unthread left bra strap;Hook/unhook bra;Thread/unthread right sleeve of pullover shirt/dresss;Thread/unthread left sleeve of pullover shirt/dress;Put head through opening of pull over shirt/dress;Pull shirt over trunk Upper body dressing/undressing: 7: Complete Independence: No helper FIM - Lower Body Dressing/Undressing Lower body dressing/undressing steps patient completed: Thread/unthread right pants leg;Thread/unthread left pants leg;Pull pants up/down;Don/Doff right sock Lower body dressing/undressing: 4: Steadying Assist  FIM - Toileting Toileting steps completed by patient: Adjust clothing prior to toileting;Performs perineal hygiene;Adjust clothing after toileting Toileting Assistive Devices: Grab bar or rail for support Toileting: 5: Supervision: Safety issues/verbal cues  FIM - Diplomatic Services operational officer Devices: Elevated toilet seat;Grab bars Toilet Transfers: 5-To toilet/BSC: Supervision (verbal cues/safety issues);5-From toilet/BSC: Supervision (verbal cues/safety issues)  FIM - Bed/Chair Transfer Bed/Chair Transfer Assistive Devices: Bed rails;Arm rests Bed/Chair Transfer: 4: Bed > Chair or W/C: Min A (steadying Pt. > 75%)  FIM - Locomotion: Wheelchair Locomotion: Wheelchair: 2: Travels 50 - 149 ft with supervision, cueing or coaxing FIM - Locomotion: Ambulation Locomotion: Ambulation Assistive Devices: Designer, industrial/product Ambulation/Gait Assistance: 4: Min assist Locomotion: Ambulation: 1: Travels less than 50 ft with minimal  assistance (Pt.>75%)  Comprehension Comprehension Mode: Auditory Comprehension: 6-Follows complex conversation/direction: With extra time/assistive device  Expression Expression Mode: Verbal Expression: 7-Expresses complex ideas: With no assist  Social Interaction Social Interaction: 7-Interacts appropriately with others - No medications needed.  Problem Solving Problem Solving: 5-Solves complex 90% of the time/cues < 10% of the time  Memory Memory: 7-Complete Independence: No helper  Medical Problem List and Plan:  1. DVT Prophylaxis/Anticoagulation: Pharmaceutical: Lovenox  2. Pain Management: reports oxycodone ineffective. Will start MS contin for consistent pain relief with prn MSIR.  3. Mood: seems to have good outlook. Will have LCSW follow up for formal evaluation.  4. Neuropsych: This patient is capable of making decisions on his/her own behalf.  5. DM type 2: Monitor with AC/HSCBG checks. Will  continue amaryl, metformin and lantus. Continue 6 units for meal coverage and titrate lantus as needed. Sugars on the low side yesterday but seem to be recovering today.  6. Hypokalemia: Likely dilutional. Will supplement and recheck on Monday.  7. ABLA: Add iron supplement. Will recheck on Monday.  8. Wound: continue post-op dressing. Remove dressing in the am and then ACE perhaps     LOS (Days) 4 A FACE TO FACE EVALUATION WAS PERFORMED  Jayne Peckenpaugh T 01/20/2012, 8:35 AM

## 2012-01-20 NOTE — Progress Notes (Signed)
Patient information reviewed and entered into [facility] system by Carlon Chaloux, RN, CRRN, PPS Coordinator.  Information including medical coding and functional independence measure will be reviewed and updated through discharge.    

## 2012-01-20 NOTE — Significant Event (Signed)
Hypoglycemic Event  CBG: 66  Treatment: 1 tube instant glucose  Symptoms: None  Follow-up CBG: Time:1755 CBG Result:58  Possible Reasons for Event: Unknown  Comments/MD notified:hypoglycemic protocol followed again    Morgan Rennert, Phill Mutter  Remember to initiate Hypoglycemia Order Set & complete

## 2012-01-20 NOTE — Progress Notes (Signed)
Social Work Patient ID: [name], female   DOB: [birth year], 56 y.o.   MRN: [id number] Met with pt and informed team conference goals-mod/i level and discharge date 9/10.  Pt is pleased with her progress and will be ready. She wants DME from [facility] since received walker and bsc from them before.  Will work toward discharge next week.

## 2012-01-20 NOTE — Progress Notes (Signed)
Hypoglycemic Event  CBG: 51  Treatment: 15 GM carbohydrate snack  Symptoms: None  Follow-up CBG: Time:2115 CBG Result:81  Possible Reasons for Event:   Comments/MD notified:Pam Love PA    [name], [name]  Remember to initiate Hypoglycemia Order Set & complete

## 2012-01-20 NOTE — Progress Notes (Signed)
Orthopedic Tech Progress Note Patient Details:  [name] 1955/05/26 [id number]  Patient ID: [name], female   DOB: [birth year], 56 y.o.   MRN: [id number]   Shawnie Pons 01/20/2012, 8:31 AM CALLED [facility] FOR LEFT STOCKING

## 2012-01-21 ENCOUNTER — Inpatient Hospital Stay (HOSPITAL_COMMUNITY): Payer: Managed Care, Other (non HMO) | Admitting: Occupational Therapy

## 2012-01-21 ENCOUNTER — Inpatient Hospital Stay (HOSPITAL_COMMUNITY): Payer: Managed Care, Other (non HMO) | Admitting: *Deleted

## 2012-01-21 ENCOUNTER — Inpatient Hospital Stay (HOSPITAL_COMMUNITY): Payer: Managed Care, Other (non HMO)

## 2012-01-21 LAB — GLUCOSE, CAPILLARY
Glucose-Capillary: 89 mg/dL (ref 70–99)
Glucose-Capillary: 79 mg/dL (ref 70–99)

## 2012-01-21 MED ORDER — METFORMIN HCL 500 MG PO TABS
250.0000 mg | ORAL_TABLET | Freq: Two times a day (BID) | ORAL | Status: DC
  Administered 2012-01-21: 08:00:00 via ORAL
  Administered 2012-01-21 – 2012-01-27 (×12): 250 mg via ORAL
  Filled 2012-01-21 (×16): qty 1

## 2012-01-21 NOTE — Plan of Care (Signed)
Problem: RH PAIN MANAGEMENT Goal: RH STG PAIN MANAGED AT OR BELOW PT'S PAIN GOAL Managed with scheduled and prn meds @/<5  Outcome: Progressing Reports pain managed with meds

## 2012-01-21 NOTE — Progress Notes (Signed)
Patient ID: [name], female   DOB: 1955-10-05, 56 y.o.   MRN: [id number]  Subjective/Complaints: Slept well. Pain meds help pain. No complaints today.  No pain C/os Objective: Vital Signs: Blood pressure 117/73, pulse 86, temperature 98.7 F (37.1 C), temperature source Oral, resp. rate 19, height 5\' 6"  (1.676 m), weight 96.9 kg (213 lb 10 oz), SpO2 100.00%. No results found. Results for orders placed during the hospital encounter of 01/16/12 (from the past 72 hour(s))  GLUCOSE, CAPILLARY     Status: Abnormal   Collection Time   01/18/12  7:36 AM      Component Value Range Comment   Glucose-Capillary 67 (*) 70 - 99 mg/dL    Comment 1 Notify RN     GLUCOSE, CAPILLARY     Status: Normal   Collection Time   01/18/12  8:07 AM      Component Value Range Comment   Glucose-Capillary 95  70 - 99 mg/dL    Comment 1 Notify RN     GLUCOSE, CAPILLARY     Status: Abnormal   Collection Time   01/18/12 11:30 AM      Component Value Range Comment   Glucose-Capillary 118 (*) 70 - 99 mg/dL    Comment 1 Notify RN     GLUCOSE, CAPILLARY     Status: Abnormal   Collection Time   01/18/12  4:25 PM      Component Value Range Comment   Glucose-Capillary 49 (*) 70 - 99 mg/dL    Comment 1 Notify RN     GLUCOSE, CAPILLARY     Status: Normal   Collection Time   01/18/12  5:15 PM      Component Value Range Comment   Glucose-Capillary 72  70 - 99 mg/dL   GLUCOSE, CAPILLARY     Status: Abnormal   Collection Time   01/18/12  9:05 PM      Component Value Range Comment   Glucose-Capillary 153 (*) 70 - 99 mg/dL    Comment 1 Notify RN     COMPREHENSIVE METABOLIC PANEL     Status: Abnormal   Collection Time   01/19/12  5:00 AM      Component Value Range Comment   Sodium 137  135 - 145 mEq/L    Potassium 3.8  3.5 - 5.1 mEq/L    Chloride 100  96 - 112 mEq/L    CO2 26  19 - 32 mEq/L    Glucose, Bld 101 (*) 70 - 99 mg/dL    BUN 15  6 - 23 mg/dL    Creatinine, Ser 1.19  0.50 - 1.10 mg/dL    Calcium 9.0  8.4 -  10.5 mg/dL    Total Protein 6.5  6.0 - 8.3 g/dL    Albumin 2.5 (*) 3.5 - 5.2 g/dL    AST 11  0 - 37 U/L    ALT 12  0 - 35 U/L    Alkaline Phosphatase 118 (*) 39 - 117 U/L    Total Bilirubin 0.2 (*) 0.3 - 1.2 mg/dL    GFR calc non Af Amer 71 (*) >90 mL/min    GFR calc Af Amer 82 (*) >90 mL/min   CBC WITH DIFFERENTIAL     Status: Abnormal   Collection Time   01/19/12  5:00 AM      Component Value Range Comment   WBC 6.9  4.0 - 10.5 K/uL    RBC 3.29 (*) 3.87 - 5.11 MIL/uL  Hemoglobin 8.8 (*) 12.0 - 15.0 g/dL    HCT 40.9 (*) 81.1 - 46.0 %    MCV 84.2  78.0 - 100.0 fL    MCH 26.7  26.0 - 34.0 pg    MCHC 31.8  30.0 - 36.0 g/dL    RDW 91.4  78.2 - 95.6 %    Platelets 253  150 - 400 K/uL    Neutrophils Relative 68  43 - 77 %    Neutro Abs 4.6  1.7 - 7.7 K/uL    Lymphocytes Relative 26  12 - 46 %    Lymphs Abs 1.8  0.7 - 4.0 K/uL    Monocytes Relative 5  3 - 12 %    Monocytes Absolute 0.3  0.1 - 1.0 K/uL    Eosinophils Relative 2  0 - 5 %    Eosinophils Absolute 0.1  0.0 - 0.7 K/uL    Basophils Relative 0  0 - 1 %    Basophils Absolute 0.0  0.0 - 0.1 K/uL   GLUCOSE, CAPILLARY     Status: Abnormal   Collection Time   01/19/12  7:13 AM      Component Value Range Comment   Glucose-Capillary 109 (*) 70 - 99 mg/dL    Comment 1 Notify RN     GLUCOSE, CAPILLARY     Status: Normal   Collection Time   01/19/12 11:27 AM      Component Value Range Comment   Glucose-Capillary 99  70 - 99 mg/dL    Comment 1 Notify RN     GLUCOSE, CAPILLARY     Status: Normal   Collection Time   01/19/12  4:50 PM      Component Value Range Comment   Glucose-Capillary 84  70 - 99 mg/dL   GLUCOSE, CAPILLARY     Status: Abnormal   Collection Time   01/19/12  9:12 PM      Component Value Range Comment   Glucose-Capillary 51 (*) 70 - 99 mg/dL   GLUCOSE, CAPILLARY     Status: Normal   Collection Time   01/19/12  9:26 PM      Component Value Range Comment   Glucose-Capillary 72  70 - 99 mg/dL   GLUCOSE, CAPILLARY      Status: Normal   Collection Time   01/19/12  9:40 PM      Component Value Range Comment   Glucose-Capillary 81  70 - 99 mg/dL   GLUCOSE, CAPILLARY     Status: Abnormal   Collection Time   01/20/12 12:53 AM      Component Value Range Comment   Glucose-Capillary 150 (*) 70 - 99 mg/dL   GLUCOSE, CAPILLARY     Status: Normal   Collection Time   01/20/12  7:22 AM      Component Value Range Comment   Glucose-Capillary 96  70 - 99 mg/dL    Comment 1 Notify RN     GLUCOSE, CAPILLARY     Status: Abnormal   Collection Time   01/20/12 11:41 AM      Component Value Range Comment   Glucose-Capillary 133 (*) 70 - 99 mg/dL    Comment 1 Notify RN      Comment 2 Documented in Chart     GLUCOSE, CAPILLARY     Status: Abnormal   Collection Time   01/20/12  4:42 PM      Component Value Range Comment   Glucose-Capillary 67 (*) 70 - 99  mg/dL   GLUCOSE, CAPILLARY     Status: Abnormal   Collection Time   01/20/12  5:23 PM      Component Value Range Comment   Glucose-Capillary 69 (*) 70 - 99 mg/dL    Comment 1 Notify RN     GLUCOSE, CAPILLARY     Status: Abnormal   Collection Time   01/20/12  5:49 PM      Component Value Range Comment   Glucose-Capillary 66 (*) 70 - 99 mg/dL    Comment 1 Notify RN     GLUCOSE, CAPILLARY     Status: Abnormal   Collection Time   01/20/12  5:55 PM      Component Value Range Comment   Glucose-Capillary 58 (*) 70 - 99 mg/dL    Comment 1 Notify RN     GLUCOSE, CAPILLARY     Status: Abnormal   Collection Time   01/20/12  6:07 PM      Component Value Range Comment   Glucose-Capillary 66 (*) 70 - 99 mg/dL   GLUCOSE, CAPILLARY     Status: Normal   Collection Time   01/20/12  6:28 PM      Component Value Range Comment   Glucose-Capillary 84  70 - 99 mg/dL   GLUCOSE, CAPILLARY     Status: Normal   Collection Time   01/20/12  9:11 PM      Component Value Range Comment   Glucose-Capillary 77  70 - 99 mg/dL      HEENT: normal Cardio: RRR Resp: CTA B/L GI: BS  positive Extremity:  Edema L stump Skin:   Wound ABD pads clean Neuro: Alert/Oriented, Anxious and Normal Motor Musc/Skel:  Other L BKA Wound clean and intact. No drainage. Limb is well formed.   Assessment/Plan: 1. Functional deficits secondary to L BKA  which require 3+ hours per day of interdisciplinary therapy in a comprehensive inpatient rehab setting. Physiatrist is providing close team supervision and 24 hour management of active medical problems listed below. Physiatrist and rehab team continue to assess barriers to discharge/monitor patient progress toward functional and medical goals. FIM: FIM - Bathing Bathing Steps Patient Completed: Chest;Right Arm;Left Arm;Abdomen;Front perineal area;Buttocks;Right upper leg;Left upper leg;Right lower leg (including foot) Bathing: 5: Supervision: Safety issues/verbal cues  FIM - Upper Body Dressing/Undressing Upper body dressing/undressing steps patient completed: Thread/unthread right bra strap;Thread/unthread left bra strap;Hook/unhook bra;Thread/unthread right sleeve of pullover shirt/dresss;Thread/unthread left sleeve of pullover shirt/dress;Put head through opening of pull over shirt/dress;Pull shirt over trunk Upper body dressing/undressing: 7: Complete Independence: No helper FIM - Lower Body Dressing/Undressing Lower body dressing/undressing steps patient completed: Thread/unthread right pants leg;Thread/unthread left pants leg;Pull pants up/down;Don/Doff right sock Lower body dressing/undressing: 4: Steadying Assist  FIM - Toileting Toileting steps completed by patient: Adjust clothing prior to toileting;Performs perineal hygiene;Adjust clothing after toileting Toileting Assistive Devices: Grab bar or rail for support Toileting: 5: Supervision: Safety issues/verbal cues  FIM - Diplomatic Services operational officer Devices: Elevated toilet seat;Grab bars Toilet Transfers: 5-To toilet/BSC: Supervision (verbal cues/safety  issues);5-From toilet/BSC: Supervision (verbal cues/safety issues)  FIM - Bed/Chair Transfer Bed/Chair Transfer Assistive Devices: Bed rails;Arm rests Bed/Chair Transfer: 7: Supine > Sit: No assist;7: Sit > Supine: No assist;5: Bed > Chair or W/C: Supervision (verbal cues/safety issues)  FIM - Locomotion: Wheelchair Locomotion: Wheelchair: 1: Travels less than 50 ft with supervision, cueing or coaxing FIM - Locomotion: Ambulation Locomotion: Ambulation Assistive Devices: Designer, industrial/product Ambulation/Gait Assistance: 4: Min assist Locomotion: Ambulation: 1: Merchant navy officer  less than 50 ft with minimal assistance (Pt.>75%)  Comprehension Comprehension Mode: Auditory Comprehension: 6-Follows complex conversation/direction: With extra time/assistive device  Expression Expression Mode: Verbal Expression: 7-Expresses complex ideas: With no assist  Social Interaction Social Interaction: 7-Interacts appropriately with others - No medications needed.  Problem Solving Problem Solving: 5-Solves complex 90% of the time/cues < 10% of the time  Memory Memory: 7-Complete Independence: No helper  Medical Problem List and Plan:  1. DVT Prophylaxis/Anticoagulation: Pharmaceutical: Lovenox  2. Pain Management: reports oxycodone ineffective. Will start MS contin for consistent pain relief with prn MSIR.  3. Mood: seems to have good outlook. Will have LCSW follow up for formal evaluation.  4. Neuropsych: This patient is capable of making decisions on his/her own behalf.  5. DM type 2: Monitor with AC/HSCBG checks. Will continue amaryl, decrease metformin to 250mg . Dc  lantus. prn meal coverage and titrate lantus as needed. Sugars on the low side yesterday but seem to be recovering today.  6. Hypokalemia: Likely dilutional. supplemented  7. ABLA: Add iron supplement. Will recheck on Monday.  8. Wound: ACE dressing. Can switch to shrinker soon.    LOS (Days) 5 A FACE TO FACE EVALUATION WAS  PERFORMED  Yaeko Fazekas T 01/21/2012, 7:09 AM

## 2012-01-21 NOTE — Progress Notes (Signed)
Physical Therapy Session Note  Patient Details  Name: [name] MRN: [id number] Date of Birth: 1955-10-05  Today's Date: 01/21/2012 Time:  1300-1355  55 Minutes  Short Term Goals: Week 1:  PT Short Term Goal 1 (Week 1): STG = LTG  Skilled Therapeutic Interventions/Progress Updates:    Mod I bed mobility, steady A transfer from bed to w/c with cueing needed for safety. W/c propulsion on unit for endurance and strengthening with encouragement. Simulated car transfer to SUV height with RW x 2 reps with min A, cueing needed for removing legrests prior to sit ->stand and min A for balance. Reviewed LE HEP with 3 sets of 10 reps for each exercises, also completed modified bridges and LAQ x 10 reps x 3 sets each. Gait with RW with steady A x 15'. Pt requiring frequent rest breaks due to decreased muscular endurance.  Therapy Documentation Precautions:  Precautions Precautions: Fall Required Braces or Orthoses: Other Brace/Splint Other Brace/Splint: Pt was given knee immobilizer. No orders stating what leg or wearing schedule. Restrictions Weight Bearing Restrictions: Yes LLE Weight Bearing: Non weight bearing LLE Partial Weight Bearing Percentage or Pounds:  (left bka) Other Position/Activity Restrictions: BKA  Pain: No complaints  See FIM for current functional status  Therapy/Group: Individual Therapy  [name] [facility] 01/21/2012, 1:37 PM

## 2012-01-21 NOTE — Plan of Care (Signed)
Problem: RH BOWEL ELIMINATION Goal: RH STG MANAGE BOWEL WITH ASSISTANCE STG Manage Bowel with mod I Assistance. Of stool softener daily  Outcome: Progressing Continue miralax daily  Problem: RH SKIN INTEGRITY Goal: RH STG ABLE TO PERFORM INCISION/WOUND CARE W/ASSISTANCE STG Able To Perform Incision/Wound Care With [employer].  Outcome: Progressing Need to practice stump wrapping and dressing of limb

## 2012-01-21 NOTE — Progress Notes (Signed)
Physical Therapy Session Note  Patient Details  Name: IEASHA BOEREMA MRN: [id number] Date of Birth: 05-09-1956  Today's Date: 01/21/2012 Time: 4098-1191 Time Calculation (min): 43 min  Short Term Goals: Week 1:  PT Short Term Goal 1 (Week 1): STG = LTG  Skilled Therapeutic Interventions/Progress Updates:    w/c propulsion for endurance, strengthening and functional mobility training on unit with rest breaks as needed. Transfer training squat pivot technique with cueing for w/c parts management (leaving legrests on) and technique. Gait with RW 10' x 2 with min A cueing for lifting through UEs to aid with foot clearance. Discussed obtaining measurements for house for w/c accessibility and handout given for pt to give to nephew. Dynamic standing balance activity with 1 UE support for reaching to each side outside BOS and overhead to place horseshoes on basketball rim with supervision.   Therapy Documentation Precautions:  Precautions Precautions: Fall Required Braces or Orthoses: Other Brace/Splint Other Brace/Splint: Pt was given knee immobilizer. No orders stating what leg or wearing schedule. Restrictions Weight Bearing Restrictions: Yes LLE Weight Bearing: Non weight bearing LLE Partial Weight Bearing Percentage or Pounds:  (left bka) Other Position/Activity Restrictions: BKA   Pain: No complaints.  See FIM for current functional status  Therapy/Group: Individual Therapy  [name] [facility] 01/21/2012, 9:14 AM

## 2012-01-21 NOTE — Progress Notes (Signed)
Occupational Therapy Session Note  Patient Details  Name: KAYCE CHISMAR MRN: [id number] Date of Birth: [birth year]  Today's Date: 01/21/2012 Time: 4098-1191 Time Calculation (min): 27 min   Skilled Therapeutic Interventions/Progress Updates:    Worked on static standing balance at the high/low table while engaged in activities using the UEs.  Pt able to perform sit to stand and maintain standing for 5 min intervals with occasional min facilitation.  She was able to release with one hand at a time during the activity.  On a couple of occassions she was able to release both hands and perform the activity but still needed min assist for balance.  To finish session had pt push wheelchair 1/2 back to her room using her UEs.  She reported being very fatigued in her UEs after completing this.  Therapy Documentation Precautions:  Precautions Precautions: Fall Required Braces or Orthoses: Other Brace/Splint Other Brace/Splint: Pt was given knee immobilizer. No orders stating what leg or wearing schedule. Restrictions Weight Bearing Restrictions: Yes LLE Weight Bearing: Non weight bearing LLE Partial Weight Bearing Percentage or Pounds:  (left bka) Other Position/Activity Restrictions: BKA Pain: Pain Assessment Pain Assessment: No/denies pain Pain Score:   4 Pain Location: Leg Pain Orientation: Left Pain Descriptors: Aching Pain Intervention(s): Medication (See eMAR)  See FIM for current functional status  Therapy/Group: Individual Therapy  Enora Trillo OTR/L 01/21/2012, 3:57 PM

## 2012-01-21 NOTE — Progress Notes (Signed)
Occupational Therapy Session Note  Patient Details  Name: [name] MRN: [id number] Date of Birth: 1955-06-28  Today's Date: 01/21/2012 Time: 6213-0865 - 55 Minutes Time Calculation (min): 55 min  Short Term Goals: Week 1:  OT Short Term Goal 1 (Week 1): Patient will perform bathing at bed level at Supervision. OT Short Term Goal 2 (Week 1): Patient will perform dressing at bed level at Supervision. OT Short Term Goal 3 (Week 1): Patient will perform toileting at Supervision level. OT Short Term Goal 4 (Week 1): Patient will perform Toilet transfer at Supervision level with bedside commode. OT Short Term Goal 5 (Week 1): Patient will perform tub/shower transfer at Supervision level with tub transfer bench.  Skilled Therapeutic Interventions/Progress Updates:  Patient found seated edge of bed eating breakfast with 3/10 pain, no intervention needed at this time. Therapist gathered all necessary items for shower level ADL while patient completed breakfast. Patient then transferred edge of bed -> w/c with close supervision, then w/c -> BSC seated in shower stall. Bathing completed at shower level in sit -> stand position. Patient transferred out of shower into w/c for dressing & grooming tasks at sink. Focused skilled intervention on dynamic standing balance/tolerance/endurance, safety with dynamic standing while pulling pants up to waist, overall activity tolerance/endurance, and safety with transfers.   Precautions:  Precautions Precautions: Fall Required Braces or Orthoses: Other Brace/Splint Other Brace/Splint: Pt was given knee immobilizer. No orders stating what leg or wearing schedule. Restrictions Weight Bearing Restrictions: Yes LLE Weight Bearing: Non weight bearing LLE Partial Weight Bearing Percentage or Pounds:  (left bka) Other Position/Activity Restrictions: BKA  See FIM for current functional status  Therapy/Group: Individual Therapy  Bryceton Hantz 01/21/2012, 9:32  AM

## 2012-01-21 NOTE — Progress Notes (Signed)
Recreational Therapy Session Note  Patient Details  Name: [name] MRN: [id number] Date of Birth: 1955-06-22 Today's Date: 01/21/2012 Time:  1030-1040 Pain: no c/o  Skilled Therapeutic Interventions/Progress Updates: Pt with c/o fatigue from morning therapist and falling asleep while sitting in w/c.  Briefly discussed community reintegration with pt and then assisted pt to bed, stand pivot transfers with min assist  Therapy/Group: Individual Therapy Maryl Blalock 01/21/2012, 5:26 PM

## 2012-01-22 ENCOUNTER — Inpatient Hospital Stay (HOSPITAL_COMMUNITY): Payer: Managed Care, Other (non HMO) | Admitting: Occupational Therapy

## 2012-01-22 ENCOUNTER — Inpatient Hospital Stay (HOSPITAL_COMMUNITY): Payer: Managed Care, Other (non HMO)

## 2012-01-22 LAB — GLUCOSE, CAPILLARY
Glucose-Capillary: 152 mg/dL — ABNORMAL HIGH (ref 70–99)
Glucose-Capillary: 130 mg/dL — ABNORMAL HIGH (ref 70–99)

## 2012-01-22 NOTE — Progress Notes (Signed)
Occupational Therapy Session Note  Patient Details  Name: [name] MRN: [id number] Date of Birth: 09-21-1955  Today's Date: 01/22/2012 Time: 4098-1191 Time Calculation (min): 40 min  Short Term Goals: Week 1:  OT Short Term Goal 1 (Week 1): Patient will perform bathing at bed level at Supervision. OT Short Term Goal 2 (Week 1): Patient will perform dressing at bed level at Supervision. OT Short Term Goal 3 (Week 1): Patient will perform toileting at Supervision level. OT Short Term Goal 4 (Week 1): Patient will perform Toilet transfer at Supervision level with bedside commode. OT Short Term Goal 5 (Week 1): Patient will perform tub/shower transfer at Supervision level with tub transfer bench.  Skilled Therapeutic Interventions/Progress Updates:  Patient found supine in bed with 8/10 pain in left residual limb, RN notified. Patient engaged in bed mobility for edge of bed -> w/c stand pivot transfer with supervision. Patient then performed ADL at sink level at overall supervision level. Patient gathered all necessary items for ADL at w/c level. Focused skilled intervention on sit/stands, dynamic standing with/without UE support, safety with standing, w/c management, w/c mobility, grooming tasks seated at sink, and education regarding compression wrapping. At end of session left patient seated in w/c beside bed with call bell, phone, and breakfast tray.   Precautions:  Precautions Precautions: Fall Required Braces or Orthoses: Other Brace/Splint Other Brace/Splint: Pt was given knee immobilizer. No orders stating what leg or wearing schedule. Restrictions Weight Bearing Restrictions: Yes LLE Weight Bearing: Non weight bearing (left bka) LLE Partial Weight Bearing Percentage or Pounds:  (left bka) Other Position/Activity Restrictions: BKA  See FIM for current functional status  Therapy/Group: Individual Therapy  Daymen Hassebrock 01/22/2012, 8:31 AM

## 2012-01-22 NOTE — Progress Notes (Signed)
Physical Therapy Session Note  Patient Details  Name: [name] MRN: [id number] Date of Birth: [birth year]  Today's Date: 01/22/2012 Time: 4098-1191 Time Calculation (min): 57 min  Short Term Goals: Week 1:  PT Short Term Goal 1 (Week 1): STG = LTG  Skilled Therapeutic Interventions/Progress Updates:    Treatment focused on transfer training with emphasis on set up of w/c, safety, and technique multiple repetitions with cueing initially and progressed to supervision for safety only. Gait with RW and measured gait velocity = 0.25 ft/sec, pt only able to go 13 and 15' at a time before needing a seated break. Improved clearance of R foot noted, steady A initially and progressed to close supervision in straight line. Discussed goal distance with pt to be able to maneuver in home if needed. Nephew to measure doorways. Simulated car transfer with RW for SUV height with steady A overall, cueing for reaching back with hands before sitting. W/c propulsion on unit for endurance and strengthening with extra time. Changed R leg rest for increased independence with managing legrest.  Therapy Documentation Precautions:  Precautions Precautions: Fall Required Braces or Orthoses: Other Brace/Splint Other Brace/Splint: Pt was given knee immobilizer. No orders stating what leg or wearing schedule. Restrictions Weight Bearing Restrictions: Yes LLE Weight Bearing: Non weight bearing (left bka) LLE Partial Weight Bearing Percentage or Pounds:  (left bka) Other Position/Activity Restrictions: BKA  Pain: Denies pain. Reports being premedicated.    Locomotion : Ambulation Ambulation/Gait Assistance: 4: Min guard   See FIM for current functional status  Therapy/Group: Individual Therapy  [name] [facility] 01/22/2012, 9:14 AM

## 2012-01-22 NOTE — Progress Notes (Signed)
Physical Therapy Session Note  Patient Details  Name: [name] MRN: [id number] Date of Birth: 11-04-1955  Today's Date: 01/22/2012 Time: 4010-2725 Time Calculation (min): 43 min  Short Term Goals: Week 1:  PT Short Term Goal 1 (Week 1): STG = LTG  Skilled Therapeutic Interventions/Progress Updates:    Dynamic standing balance activity at table top with 1 UE support with close supervision to play card game, rest breaks as needed in sitting. Reviewed squat pivot transfers with appropriate set up of w/c but still requiring cueing for positioning and removing leg rests for safety demonstrating poor carryover from earlier PT session. W/c propulsion on unit for endurance and strengthening with supervision and extra time.  Therapy Documentation Precautions:  Precautions Precautions: Fall Required Braces or Orthoses: Other Brace/Splint Other Brace/Splint: Pt was given knee immobilizer. No orders stating what leg or wearing schedule. Restrictions Weight Bearing Restrictions: Yes LLE Weight Bearing: Non weight bearing (Left BKA) LLE Partial Weight Bearing Percentage or Pounds:  (left bka) Other Position/Activity Restrictions: BKA   Pain: Reports pain in L residual limb end of session - RN notified for pain medication.   Locomotion : Ambulation Ambulation/Gait Assistance: 4: Min guard   See FIM for current functional status  Therapy/Group: Individual Therapy  [name] [facility] 01/22/2012, 12:05 PM

## 2012-01-22 NOTE — Progress Notes (Signed)
Patient ID: KALEE BROXTON, female   DOB: 02-20-1956, 56 y.o.   MRN: [id number]  Subjective/Complaints: Slept well. Pain meds help pain. No complaints today. Making progress with therapies. No pain C/os Objective: Vital Signs: Blood pressure 114/72, pulse 81, temperature 98 F (36.7 C), temperature source Oral, resp. rate 18, height 5\' 6"  (1.676 m), weight 99.7 kg (219 lb 12.8 oz), SpO2 99.00%. No results found. Results for orders placed during the hospital encounter of 01/16/12 (from the past 72 hour(s))  GLUCOSE, CAPILLARY     Status: Normal   Collection Time   01/19/12 11:27 AM      Component Value Range Comment   Glucose-Capillary 99  70 - 99 mg/dL    Comment 1 Notify RN     GLUCOSE, CAPILLARY     Status: Normal   Collection Time   01/19/12  4:50 PM      Component Value Range Comment   Glucose-Capillary 84  70 - 99 mg/dL   GLUCOSE, CAPILLARY     Status: Abnormal   Collection Time   01/19/12  9:12 PM      Component Value Range Comment   Glucose-Capillary 51 (*) 70 - 99 mg/dL   GLUCOSE, CAPILLARY     Status: Normal   Collection Time   01/19/12  9:26 PM      Component Value Range Comment   Glucose-Capillary 72  70 - 99 mg/dL   GLUCOSE, CAPILLARY     Status: Normal   Collection Time   01/19/12  9:40 PM      Component Value Range Comment   Glucose-Capillary 81  70 - 99 mg/dL   GLUCOSE, CAPILLARY     Status: Abnormal   Collection Time   01/20/12 12:53 AM      Component Value Range Comment   Glucose-Capillary 150 (*) 70 - 99 mg/dL   GLUCOSE, CAPILLARY     Status: Normal   Collection Time   01/20/12  7:22 AM      Component Value Range Comment   Glucose-Capillary 96  70 - 99 mg/dL    Comment 1 Notify RN     GLUCOSE, CAPILLARY     Status: Abnormal   Collection Time   01/20/12 11:41 AM      Component Value Range Comment   Glucose-Capillary 133 (*) 70 - 99 mg/dL    Comment 1 Notify RN      Comment 2 Documented in Chart     GLUCOSE, CAPILLARY     Status: Abnormal   Collection Time   01/20/12  4:42 PM      Component Value Range Comment   Glucose-Capillary 67 (*) 70 - 99 mg/dL   GLUCOSE, CAPILLARY     Status: Abnormal   Collection Time   01/20/12  5:23 PM      Component Value Range Comment   Glucose-Capillary 69 (*) 70 - 99 mg/dL    Comment 1 Notify RN     GLUCOSE, CAPILLARY     Status: Abnormal   Collection Time   01/20/12  5:49 PM      Component Value Range Comment   Glucose-Capillary 66 (*) 70 - 99 mg/dL    Comment 1 Notify RN     GLUCOSE, CAPILLARY     Status: Abnormal   Collection Time   01/20/12  5:55 PM      Component Value Range Comment   Glucose-Capillary 58 (*) 70 - 99 mg/dL    Comment 1 Notify RN  GLUCOSE, CAPILLARY     Status: Abnormal   Collection Time   01/20/12  6:07 PM      Component Value Range Comment   Glucose-Capillary 66 (*) 70 - 99 mg/dL   GLUCOSE, CAPILLARY     Status: Normal   Collection Time   01/20/12  6:28 PM      Component Value Range Comment   Glucose-Capillary 84  70 - 99 mg/dL   GLUCOSE, CAPILLARY     Status: Normal   Collection Time   01/20/12  9:11 PM      Component Value Range Comment   Glucose-Capillary 77  70 - 99 mg/dL   GLUCOSE, CAPILLARY     Status: Normal   Collection Time   01/21/12  7:11 AM      Component Value Range Comment   Glucose-Capillary 89  70 - 99 mg/dL    Comment 1 Notify RN     GLUCOSE, CAPILLARY     Status: Abnormal   Collection Time   01/21/12 11:32 AM      Component Value Range Comment   Glucose-Capillary 132 (*) 70 - 99 mg/dL    Comment 1 Notify RN     GLUCOSE, CAPILLARY     Status: Normal   Collection Time   01/21/12  4:39 PM      Component Value Range Comment   Glucose-Capillary 79  70 - 99 mg/dL    Comment 1 Notify RN     GLUCOSE, CAPILLARY     Status: Abnormal   Collection Time   01/22/12  7:14 AM      Component Value Range Comment   Glucose-Capillary 134 (*) 70 - 99 mg/dL    Comment 1 Notify RN        HEENT: normal Cardio: RRR Resp: CTA B/L GI: BS positive Extremity:  Edema L stump Skin:    Wound ABD pads clean Neuro: Alert/Oriented, Anxious and Normal Motor Musc/Skel:  Other L BKA Wound clean and intact. No drainage. Limb is well formed. ACE fitting appropriately  Assessment/Plan: 1. Functional deficits secondary to L BKA  which require 3+ hours per day of interdisciplinary therapy in a comprehensive inpatient rehab setting. Physiatrist is providing close team supervision and 24 hour management of active medical problems listed below. Physiatrist and rehab team continue to assess barriers to discharge/monitor patient progress toward functional and medical goals. FIM: FIM - Bathing Bathing Steps Patient Completed: Chest;Right Arm;Left Arm;Abdomen;Front perineal area;Right upper leg;Buttocks;Left upper leg;Right lower leg (including foot) Bathing: 5: Supervision: Safety issues/verbal cues  FIM - Upper Body Dressing/Undressing Upper body dressing/undressing steps patient completed: Thread/unthread right sleeve of pullover shirt/dresss;Thread/unthread right bra strap;Thread/unthread left bra strap;Hook/unhook bra;Thread/unthread left sleeve of pullover shirt/dress;Put head through opening of pull over shirt/dress;Pull shirt over trunk Upper body dressing/undressing: 7: Complete Independence: No helper FIM - Lower Body Dressing/Undressing Lower body dressing/undressing steps patient completed: Thread/unthread right pants leg;Thread/unthread right underwear leg;Thread/unthread left underwear leg;Pull underwear up/down;Thread/unthread left pants leg;Pull pants up/down;Don/Doff right sock Lower body dressing/undressing: 5: Supervision: Safety issues/verbal cues  FIM - Toileting Toileting steps completed by patient: Adjust clothing prior to toileting;Performs perineal hygiene;Adjust clothing after toileting Toileting Assistive Devices: Grab bar or rail for support Toileting: 0: Activity did not occur  FIM - Diplomatic Services operational officer Devices: Elevated toilet  seat;Grab bars Toilet Transfers: 0-Activity did not occur  FIM - Banker Devices: Bed rails;Arm rests Bed/Chair Transfer: 6: Supine > Sit: No assist;5: Bed > Chair or  W/C: Supervision (verbal cues/safety issues)  FIM - Locomotion: Wheelchair Locomotion: Wheelchair: 5: Travels 150 ft or more: maneuvers on rugs and over door sills with supervision, cueing or coaxing FIM - Locomotion: Ambulation Locomotion: Ambulation Assistive Devices: Designer, industrial/product Ambulation/Gait Assistance: 4: Min assist Locomotion: Ambulation: 1: Travels less than 50 ft with minimal assistance (Pt.>75%)  Comprehension Comprehension Mode: Auditory Comprehension: 7-Follows complex conversation/direction: With no assist  Expression Expression Mode: Verbal Expression: 7-Expresses complex ideas: With no assist  Social Interaction Social Interaction: 7-Interacts appropriately with others - No medications needed.  Problem Solving Problem Solving: 5-Solves complex 90% of the time/cues < 10% of the time  Memory Memory: 7-Complete Independence: No helper  Medical Problem List and Plan:  1. DVT Prophylaxis/Anticoagulation: Pharmaceutical: Lovenox  2. Pain Management: reports oxycodone ineffective. Will start MS contin for consistent pain relief with prn MSIR.  3. Mood: seems to have good outlook. Will have LCSW follow up for formal evaluation.  4. Neuropsych: This patient is capable of making decisions on his/her own behalf.  5. DM type 2: Monitor with AC/HSCBG checks. Will continue amaryl, decrease metformin to 250mg . Dc  lantus. prn meal coverage and titrate lantus as needed. Sugars showed reasonable recovery today.  6. Hypokalemia: Likely dilutional. supplemented  7. ABLA: Add iron supplement. Will recheck on Monday.  8. Wound: ACE dressing. Can switch to shrinker soon.    LOS (Days) 6 A FACE TO FACE EVALUATION WAS PERFORMED  Cyndia Degraff T 01/22/2012, 8:38 AM

## 2012-01-22 NOTE — Progress Notes (Signed)
Occupational Therapy Session Note  Patient Details  Name: [name] MRN: [id number] Date of Birth: 29-Jul-1955  Today's Date: 01/22/2012 Time: 1000-1025 Time Calculation (min): 25 min  Short Term Goals: Week 1:  OT Short Term Goal 1 (Week 1): Patient will perform bathing at bed level at Supervision. OT Short Term Goal 2 (Week 1): Patient will perform dressing at bed level at Supervision. OT Short Term Goal 3 (Week 1): Patient will perform toileting at Supervision level. OT Short Term Goal 4 (Week 1): Patient will perform Toilet transfer at Supervision level with bedside commode. OT Short Term Goal 5 (Week 1): Patient will perform tub/shower transfer at Supervision level with tub transfer bench.  Skilled Therapeutic Interventions/Progress Updates:    Pt seen for individual OT treatment session with focused skilled intervention on sit/stands, dynamic standing with/without UE support, safety with standing, w/c management, w/c mobility, and education [id number] during laundry & homemaking activities. Pt w/ LOB x2 during laundry activity. First LOB occurred when using RW to turn around in laundry room & pt was able to self correct. Second LOB occurred when pt was attempting to perform SPT back to w/c. Pt required Mod A from therapist to prevent fall in hallway. Pt was educated in proper transfer as she was noted to attempt transfer at angle and she was too far away from w/c at time. Pt was noted to reach too far for w/c armrest & lost her balance. Recommend pt cont to practice transfers during simulated homemaking tasks & slow down/not rush. At end of session left patient in room, seated in w/c beside bed with call bell, phone.      Therapy Documentation Precautions:  Precautions Precautions: Fall Required Braces or Orthoses: Other Brace/Splint Other Brace/Splint: Pt was given knee immobilizer. No orders stating what leg or wearing schedule. Restrictions Weight Bearing Restrictions:  Yes LLE Weight Bearing: Non weight bearing (left bka) LLE Partial Weight Bearing Percentage or Pounds:  (left bka) Other Position/Activity Restrictions: BKA     Pain: Pain Assessment Pain Assessment: 0-10 Pain Score:   No c/o "I'm fine, no pain" Pain Type: Surgical pain Pain Location: Leg Pain Orientation: Left Pain Descriptors: Aching Pain Frequency: Intermittent Pain Onset: Gradual Patients Stated Pain Goal: 4 Pain Intervention(s): Medication (See eMAR);Repositioned Multiple Pain Sites: No        See FIM for current functional status  Therapy/Group: Individual Therapy  Alm Bustard 01/22/2012, 10:29 AM

## 2012-01-22 NOTE — Progress Notes (Signed)
Physical Therapy Session Note  Patient Details  Name: [name] MRN: [id number] Date of Birth: Dec 14, 1955  Today's Date: 01/22/2012 Time: 4098-1191 Time Calculation (min): 30 min  Short Term Goals: Week 1:  PT Short Term Goal 1 (Week 1): STG = LTG  Skilled Therapeutic Interventions/Progress Updates:    Reviewed transfers again with pt for OOB and w/c <-> mat, still requiring cueing for safe technique. LE therex for residual limb strengthening x 3 sets of 10 reps each for hip abduction, isometric hip adduction, hip flexion, and SAQ. PNF diagonals with yellow weighted medicine ball each direction and trunk rotation x 10 reps x 3 sets each.   Therapy Documentation Precautions:  Precautions Precautions: Fall Required Braces or Orthoses: Other Brace/Splint Other Brace/Splint: Pt was given knee immobilizer. No orders stating what leg or wearing schedule. Restrictions Weight Bearing Restrictions: Yes LLE Weight Bearing: Non weight bearing (Left BKA) LLE Partial Weight Bearing Percentage or Pounds:  (left bka) Other Position/Activity Restrictions: BKA   Pain: No complaints.  See FIM for current functional status  Therapy/Group: Individual Therapy  [name] [facility] 01/22/2012, 2:33 PM

## 2012-01-23 ENCOUNTER — Inpatient Hospital Stay (HOSPITAL_COMMUNITY): Payer: Managed Care, Other (non HMO) | Admitting: Physical Therapy

## 2012-01-23 ENCOUNTER — Inpatient Hospital Stay (HOSPITAL_COMMUNITY): Payer: Managed Care, Other (non HMO) | Admitting: Occupational Therapy

## 2012-01-23 ENCOUNTER — Inpatient Hospital Stay (HOSPITAL_COMMUNITY): Payer: Managed Care, Other (non HMO) | Admitting: *Deleted

## 2012-01-23 DIAGNOSIS — L98499 Non-pressure chronic ulcer of skin of other sites with unspecified severity: Secondary | ICD-10-CM

## 2012-01-23 DIAGNOSIS — I739 Peripheral vascular disease, unspecified: Secondary | ICD-10-CM

## 2012-01-23 DIAGNOSIS — I2510 Atherosclerotic heart disease of native coronary artery without angina pectoris: Secondary | ICD-10-CM

## 2012-01-23 DIAGNOSIS — Z5189 Encounter for other specified aftercare: Secondary | ICD-10-CM

## 2012-01-23 DIAGNOSIS — E1165 Type 2 diabetes mellitus with hyperglycemia: Secondary | ICD-10-CM

## 2012-01-23 DIAGNOSIS — S88119A Complete traumatic amputation at level between knee and ankle, unspecified lower leg, initial encounter: Secondary | ICD-10-CM

## 2012-01-23 LAB — GLUCOSE, CAPILLARY: Glucose-Capillary: 98 mg/dL (ref 70–99)

## 2012-01-23 NOTE — Progress Notes (Signed)
Physical Therapy Weekly Progress Note  Patient Details  Name: AMILLION SCOBEE MRN: [id number] Date of Birth: [birth year]  Today's Date: 01/23/2012 Time: 8:30-9:30  Patient has met 0 of 10 long term goals.  Short term goals not set due to estimated length of stay.  Pt currently requires supervision for squat transfers with vc to keep 1 hand on a surface at all times to reduce fall risk. Pt is approaching mod I for wc mobility but continues to fatigue quickly with long distances. Gait goal was downgraded to supervision and plan for home entry is for family performing wc bump up. Pt was unable to perform step with 2 people assist safely. Pt lives alone and agrees to use wc and to wait on family to ambulate in the home.  Patient continues to demonstrate the following deficits: decreased cardiorespiratory endurance and activity tolerance, decreased standing balance and therefore will continue to benefit from skilled PT intervention to enhance overall performance with activity tolerance, balance, ability to compensate for deficits and knowledge of precautions.  See Patient's Care Plan for progression toward long term goals.  Patient not progressing toward some long term goals.  See goal revision..  Continue plan of care. Pt status discussed with PTA.  Pt in agreement with goal revisions.  herapy Documentation Precautions:  Precautions Precautions: Fall Required Braces or Orthoses: Other Brace/Splint Other Brace/Splint: Pt was given knee immobilizer. No orders stating what leg or wearing schedule. Restrictions Weight Bearing Restrictions: Yes LLE Weight Bearing: Non weight bearing LLE Partial Weight Bearing Percentage or Pounds: left BKA Other Position/Activity Restrictions: BKA Vital Signs: Therapy Vitals BP: 110/70 mmHg Pain: Pain Assessment Pain Assessment: 0-10 Pain Score:   5 Pain Type: Surgical pain Pain Location: Leg Pain Orientation: Left Pain Descriptors: Aching;Tender Pain Onset:  On-going Pain Intervention(s): Medication (See eMAR);Repositioned (wrapping and desensitization.) Multiple Pain Sites: No Mobility:   Locomotion : Ambulation Ambulation/Gait Assistance: 4: Min assist Ambulation Distance (Feet): 5 Feet Assistive device: Rolling walker Ambulation/Gait Assistance Details: Verbal cues for safe use of DME/AE Ambulation/Gait Assistance Details: vc and demonstration for walker placement with backing. High Level Ambulation High Level Ambulation: Side stepping Side Stepping: with rw. weak rt LE Stairs / Additional Locomotion Stairs: No Curb:  (attempted with 2 people but pt was unable to left rt foot of) Wheelchair Mobility Wheelchair Mobility: Yes Wheelchair Propulsion: Both upper extremities;Right lower extremity Wheelchair Parts Management: Supervision/cueing Distance: 72'  Trunk/Postural Assessment : Cervical Assessment Cervical Assessment: Within Functional Limits Thoracic Assessment Thoracic Assessment: Within Functional Limits Lumbar Assessment Lumbar Assessment: Within Functional Limits  Balance: Balance Balance Assessed: No Exercises:   Other Treatments:    See FIM for current functional status  Therapy/Group: Individual Therapy  Julian Reil 01/23/2012, 9:56 AM

## 2012-01-23 NOTE — Progress Notes (Signed)
Patient ID: [name], female   DOB: [birth year], 56 y.o.   MRN: [id number]  Subjective/Complaints: Slept well. Pain meds help pain. Moderate pain at stump. Notes phantom sensations but not pain.  No pain C/os Objective: Vital Signs: Blood pressure 105/67, pulse 76, temperature 98.5 F (36.9 C), temperature source Oral, resp. rate 19, height 5\' 6"  (1.676 m), weight 99.7 kg (219 lb 12.8 oz), SpO2 97.00%. No results found. Results for orders placed during the hospital encounter of 01/16/12 (from the past 72 hour(s))  GLUCOSE, CAPILLARY     Status: Abnormal   Collection Time   01/20/12 11:41 AM      Component Value Range Comment   Glucose-Capillary 133 (*) 70 - 99 mg/dL    Comment 1 Notify RN      Comment 2 Documented in Chart     GLUCOSE, CAPILLARY     Status: Abnormal   Collection Time   01/20/12  4:42 PM      Component Value Range Comment   Glucose-Capillary 67 (*) 70 - 99 mg/dL   GLUCOSE, CAPILLARY     Status: Abnormal   Collection Time   01/20/12  5:23 PM      Component Value Range Comment   Glucose-Capillary 69 (*) 70 - 99 mg/dL    Comment 1 Notify RN     GLUCOSE, CAPILLARY     Status: Abnormal   Collection Time   01/20/12  5:49 PM      Component Value Range Comment   Glucose-Capillary 66 (*) 70 - 99 mg/dL    Comment 1 Notify RN     GLUCOSE, CAPILLARY     Status: Abnormal   Collection Time   01/20/12  5:55 PM      Component Value Range Comment   Glucose-Capillary 58 (*) 70 - 99 mg/dL    Comment 1 Notify RN     GLUCOSE, CAPILLARY     Status: Abnormal   Collection Time   01/20/12  6:07 PM      Component Value Range Comment   Glucose-Capillary 66 (*) 70 - 99 mg/dL   GLUCOSE, CAPILLARY     Status: Normal   Collection Time   01/20/12  6:28 PM      Component Value Range Comment   Glucose-Capillary 84  70 - 99 mg/dL   GLUCOSE, CAPILLARY     Status: Normal   Collection Time   01/20/12  9:11 PM      Component Value Range Comment   Glucose-Capillary 77  70 - 99 mg/dL   GLUCOSE,  CAPILLARY     Status: Normal   Collection Time   01/21/12  7:11 AM      Component Value Range Comment   Glucose-Capillary 89  70 - 99 mg/dL    Comment 1 Notify RN     GLUCOSE, CAPILLARY     Status: Abnormal   Collection Time   01/21/12 11:32 AM      Component Value Range Comment   Glucose-Capillary 132 (*) 70 - 99 mg/dL    Comment 1 Notify RN     GLUCOSE, CAPILLARY     Status: Normal   Collection Time   01/21/12  4:39 PM      Component Value Range Comment   Glucose-Capillary 79  70 - 99 mg/dL    Comment 1 Notify RN     GLUCOSE, CAPILLARY     Status: Abnormal   Collection Time   01/22/12  7:14 AM  Component Value Range Comment   Glucose-Capillary 134 (*) 70 - 99 mg/dL    Comment 1 Notify RN     GLUCOSE, CAPILLARY     Status: Abnormal   Collection Time   01/22/12 11:19 AM      Component Value Range Comment   Glucose-Capillary 152 (*) 70 - 99 mg/dL    Comment 1 Notify RN     GLUCOSE, CAPILLARY     Status: Normal   Collection Time   01/22/12  4:19 PM      Component Value Range Comment   Glucose-Capillary 87  70 - 99 mg/dL    Comment 1 Notify RN     GLUCOSE, CAPILLARY     Status: Abnormal   Collection Time   01/22/12  9:07 PM      Component Value Range Comment   Glucose-Capillary 130 (*) 70 - 99 mg/dL    Comment 1 Notify RN     GLUCOSE, CAPILLARY     Status: Normal   Collection Time   01/23/12  7:09 AM      Component Value Range Comment   Glucose-Capillary 98  70 - 99 mg/dL    Comment 1 Notify RN        HEENT: normal Cardio: RRR Resp: CTA B/L GI: BS positive Extremity:  Edema L stump Skin:   Wound ABD pads clean Neuro: Alert/Oriented, Anxious and Normal Motor Musc/Skel:  Other L BKA Wound clean and intact. No drainage. Limb is well formed. ACE fitting appropriately  Assessment/Plan: 1. Functional deficits secondary to L BKA  which require 3+ hours per day of interdisciplinary therapy in a comprehensive inpatient rehab setting. Physiatrist is providing close team  supervision and 24 hour management of active medical problems listed below. Physiatrist and rehab team continue to assess barriers to discharge/monitor patient progress toward functional and medical goals. FIM: FIM - Bathing Bathing Steps Patient Completed: Chest;Right Arm;Left Arm;Abdomen;Front perineal area;Buttocks;Right upper leg;Left upper leg;Right lower leg (including foot) Bathing: 5: Supervision: Safety issues/verbal cues  FIM - Upper Body Dressing/Undressing Upper body dressing/undressing steps patient completed: Thread/unthread left sleeve of pullover shirt/dress;Thread/unthread left bra strap;Thread/unthread right bra strap;Hook/unhook bra;Thread/unthread right sleeve of pullover shirt/dresss;Put head through opening of pull over shirt/dress;Pull shirt over trunk Upper body dressing/undressing: 7: Complete Independence: No helper FIM - Lower Body Dressing/Undressing Lower body dressing/undressing steps patient completed: Thread/unthread right underwear leg;Thread/unthread left underwear leg;Pull underwear up/down;Thread/unthread right pants leg;Thread/unthread left pants leg;Pull pants up/down;Don/Doff right sock;Don/Doff right shoe;Fasten/unfasten right shoe Lower body dressing/undressing: 5: Supervision: Safety issues/verbal cues  FIM - Toileting Toileting steps completed by patient: Adjust clothing prior to toileting;Performs perineal hygiene;Adjust clothing after toileting Toileting Assistive Devices: Grab bar or rail for support Toileting: 0: Activity did not occur  FIM - Diplomatic Services operational officer Devices: Elevated toilet seat;Grab bars Toilet Transfers: 0-Activity did not occur  FIM - Banker Devices: Bed rails;Arm rests Bed/Chair Transfer: 6: Supine > Sit: No assist;5: Bed > Chair or W/C: Supervision (verbal cues/safety issues)  FIM - Locomotion: Wheelchair Locomotion: Wheelchair: 5: Travels 150 ft or more:  maneuvers on rugs and over door sills with supervision, cueing or coaxing FIM - Locomotion: Ambulation Locomotion: Ambulation Assistive Devices: Designer, industrial/product Ambulation/Gait Assistance: 4: Min guard Locomotion: Ambulation: 1: Travels less than 50 ft with minimal assistance (Pt.>75%)  Comprehension Comprehension Mode: Auditory Comprehension: 7-Follows complex conversation/direction: With no assist  Expression Expression Mode: Verbal Expression: 7-Expresses complex ideas: With no assist  Social Interaction Social  Interaction: 7-Interacts appropriately with others - No medications needed.  Problem Solving Problem Solving: 5-Solves complex 90% of the time/cues < 10% of the time  Memory Memory: 7-Complete Independence: No helper  Medical Problem List and Plan:  1. DVT Prophylaxis/Anticoagulation: Pharmaceutical: Lovenox  2. Pain Management: reports oxycodone ineffective. Will start MS contin for consistent pain relief with prn MSIR.  3. Mood: seems to have good outlook. Will have LCSW follow up for formal evaluation.  4. Neuropsych: This patient is capable of making decisions on his/her own behalf.  5. DM type 2: Monitor with AC/HSCBG checks. Will continue amaryl, decrease metformin to 250mg . Dc  lantus. prn meal coverage and titrate lantus as needed. Sugars showed reasonable recovery today.  6. Hypokalemia: Likely dilutional. supplemented  7. ABLA: Add iron supplement. Will recheck on Monday.  8. Wound: ACE dressing. Can switch to shrinker on Monday perhaps    LOS (Days) 7 A FACE TO FACE EVALUATION WAS PERFORMED  Malone Admire T 01/23/2012, 8:52 AM

## 2012-01-23 NOTE — Progress Notes (Signed)
Occupational Therapy Session Notes  Patient Details  Name: [name] MRN: [id number] Date of Birth: 1955-09-28  Today's Date: 01/23/2012  Short Term Goals: Week 1:  OT Short Term Goal 1 (Week 1): Patient will perform bathing at bed level at Supervision. OT Short Term Goal 2 (Week 1): Patient will perform dressing at bed level at Supervision. OT Short Term Goal 3 (Week 1): Patient will perform toileting at Supervision level. OT Short Term Goal 4 (Week 1): Patient will perform Toilet transfer at Supervision level with bedside commode. OT Short Term Goal 5 (Week 1): Patient will perform tub/shower transfer at Supervision level with tub transfer bench.  Skilled Therapeutic Interventions/Progress Updates:   Session #1 [phone] - 45 Minutes Individual Therapy No complaints of pain, "I already had pain medicine this morning". Patient found supine in bed, sleepy. Engaged in bed mobility for edge of bed -> w/c stand pivot transfer with close supervision and min verbal cues to set-up w/c safely. Patient then performed ADL retraining at sink level in sit->stand position with supervision and min verbal cues to lock brakes before standing. Discussed d/c planning and home set-up with patient regarding BADLs and safe functional ambulation throughout house. Focused skilled intervention on sit->stands, dynamic standing balance/tolerance/endurance, UB/LB bathing & dressing, overall activity tolerance/endurance, and grooming tasks seated at sink. At end of session left patient seated in w/c beside bed with call bell & phone within reach.   Session #2 [id number] - 45 Minutes Individual Therapy No complaints of pain Engaged in [facility] transfers <-> bed and w/c <-> BSC for simulation like at home. Patient required min assist during all transfers and min verbal cues for overall safety. Patient then propelled self -> therapy gym for UE exercises using weighted therapy ball and weighted bar. Therapist propelled  patient back to room and left seated in w/c beside bed with call bell & phone within reach. Plan to downgrade goals to supervision level secondary to poor problem solving and poor safety awareness.   Precautions:  Precautions Precautions: Fall Required Braces or Orthoses: Other Brace/Splint Other Brace/Splint: Pt was given knee immobilizer. No orders stating what leg or wearing schedule. Restrictions Weight Bearing Restrictions: Yes LLE Weight Bearing: Non weight bearing LLE Partial Weight Bearing Percentage or Pounds:  (left bka) Other Position/Activity Restrictions: BKA  See FIM for current functional status  Beva Remund 01/23/2012, 7:39 AM

## 2012-01-23 NOTE — Progress Notes (Signed)
Social Work Patient ID: [name], female   DOB: 04/03/1956, 56 y.o.   MRN: [id number] Met with pt to give her [name] information of where to go to pick up her wheelchair and tub bench.  They informed worker family or pt needs To come and pick the DME up they no longer deliver DME to hosptial or pt's home.  She will contact them herself.  Family education scheduled For Mon and early Tues.  Getting ready for discharge Tues.

## 2012-01-23 NOTE — Plan of Care (Signed)
Problem: RH SKIN INTEGRITY Goal: RH STG ABLE TO PERFORM INCISION/WOUND CARE W/ASSISTANCE STG Able To Perform Incision/Wound Care With [employer].  Outcome: Progressing To follow up with Dr. Riley Kill on possible use of stump shrinker vs ace wrap

## 2012-01-23 NOTE — Progress Notes (Signed)
Physical Therapy Session Note  Patient Details  Name: [name] MRN: [id number] Date of Birth: 09/16/1955  Today's Date: 01/23/2012 Time: 1450-1535  Time Calculation (min): 45 min  Short Term Goals: Week 1:  PT Short Term Goal 1 (Week 1): STG = LTG  Skilled Therapeutic Interventions/Progress Updates:  TX focused on therex for LE strengthening and ROM, transfer training, and gait training with RW.   Therex: 2x15 each with cues for technique Seated therex: LLE LAQ with 5sec holds, R LAQ with 3# weights  Sidelying: L hip ABD and L hip ext with manual assist for positioning Prone hip ext and HS curls 2x15  Supine modified bridging  S for all bed mobility  Squat-pivot and sit<>stand transfers with S only and cues for efficient hand placement.   Gait training 1x10' with RW and Min A for steadying only. Pt limited by fatigue by end of day today.       Therapy Documentation Precautions:  Precautions Precautions: Fall Required Braces or Orthoses: Other Brace/Splint Other Brace/Splint: Pt was given knee immobilizer. No orders stating what leg or wearing schedule. Restrictions Weight Bearing Restrictions: Yes LLE Weight Bearing: Non weight bearing LLE Partial Weight Bearing Percentage or Pounds: left BKA Other Position/Activity Restrictions: BKA Pain: No complaints    See FIM for current functional status  Therapy/Group: Individual Therapy  Virl Cagey, PT 01/23/2012, 3:10 PM

## 2012-01-24 ENCOUNTER — Inpatient Hospital Stay (HOSPITAL_COMMUNITY): Payer: Managed Care, Other (non HMO) | Admitting: Physical Therapy

## 2012-01-24 LAB — GLUCOSE, CAPILLARY
Glucose-Capillary: 132 mg/dL — ABNORMAL HIGH (ref 70–99)
Glucose-Capillary: 116 mg/dL — ABNORMAL HIGH (ref 70–99)
Glucose-Capillary: 95 mg/dL (ref 70–99)

## 2012-01-24 LAB — URINALYSIS, ROUTINE W REFLEX MICROSCOPIC
Glucose, UA: NEGATIVE mg/dL
Hgb urine dipstick: NEGATIVE
Protein, ur: NEGATIVE mg/dL
pH: 6 (ref 5.0–8.0)

## 2012-01-24 LAB — URINE MICROSCOPIC-ADD ON

## 2012-01-24 NOTE — Progress Notes (Signed)
Physical Therapy Session Note  Patient Details  Name: [name] MRN: [id number] Date of Birth: 1956/05/09  Today's Date: 01/24/2012 Time: 1430-1530 Time Calculation (min): 60 min  Short Term Goals: Week 1:  PT Short Term Goal 1 (Week 1): STG = LTG  Skilled Therapeutic Interventions/Progress Updates:      Therapy Documentation Precautions:  Precautions Precautions: Fall Required Braces or Orthoses: Other Brace/Splint Other Brace/Splint: Pt was given knee immobilizer. No orders stating what leg or wearing schedule. Restrictions Weight Bearing Restrictions: Yes LLE Weight Bearing: Non weight bearing LLE Partial Weight Bearing Percentage or Pounds: left BKA Other Position/Activity Restrictions: BKA General:   Vital Signs: Therapy Vitals Pulse Rate: 92  Resp: 18  BP: 162/96 mmHg Patient Position, if appropriate: Sitting Oxygen Therapy SpO2: 99 % O2 Device: None (Room air) Pain: Pain Assessment Pain Score:   4 Pain Type: Surgical pain Pain Location: Leg Pain Orientation: Left Pain Descriptors: Aching Pain Intervention(s): Medication (See eMAR)  Therapeutic Exercise:(30') B LE's in supine and in sitting, Standing in RW and Walking/hopping x 20' for endurance/strength Therapeutic Activity:(15') Transfers sit<->stand S/Mod-I, w/c<->bed/toilet S/Mod-I Wheelchair Management:(15') S/Mod-I x 120' very slow and able to secure and release foot rest/L LE BKA support   See FIM for current functional status  Therapy/Group: Individual Therapy  Rex Kras 01/24/2012, 2:33 PM

## 2012-01-24 NOTE — Progress Notes (Signed)
Patient ID: [name], female   DOB: 12-Feb-1956, 56 y.o.   MRN: [id number]  Subjective/Complaints: Larey Seat this AM when trying to go to the bathroom on her own.  No pain C/os Objective: Vital Signs: Blood pressure 185/99, pulse 98, temperature 97.4 F (36.3 C), temperature source Oral, resp. rate 22, height 5\' 6"  (1.676 m), weight 99.7 kg (219 lb 12.8 oz), SpO2 100.00%. No results found. Results for orders placed during the hospital encounter of 01/16/12 (from the past 72 hour(s))  GLUCOSE, CAPILLARY     Status: Abnormal   Collection Time   01/21/12 11:32 AM      Component Value Range Comment   Glucose-Capillary 132 (*) 70 - 99 mg/dL    Comment 1 Notify RN     GLUCOSE, CAPILLARY     Status: Normal   Collection Time   01/21/12  4:39 PM      Component Value Range Comment   Glucose-Capillary 79  70 - 99 mg/dL    Comment 1 Notify RN     GLUCOSE, CAPILLARY     Status: Abnormal   Collection Time   01/22/12  7:14 AM      Component Value Range Comment   Glucose-Capillary 134 (*) 70 - 99 mg/dL    Comment 1 Notify RN     GLUCOSE, CAPILLARY     Status: Abnormal   Collection Time   01/22/12 11:19 AM      Component Value Range Comment   Glucose-Capillary 152 (*) 70 - 99 mg/dL    Comment 1 Notify RN     GLUCOSE, CAPILLARY     Status: Normal   Collection Time   01/22/12  4:19 PM      Component Value Range Comment   Glucose-Capillary 87  70 - 99 mg/dL    Comment 1 Notify RN     GLUCOSE, CAPILLARY     Status: Abnormal   Collection Time   01/22/12  9:07 PM      Component Value Range Comment   Glucose-Capillary 130 (*) 70 - 99 mg/dL    Comment 1 Notify RN     GLUCOSE, CAPILLARY     Status: Normal   Collection Time   01/23/12  7:09 AM      Component Value Range Comment   Glucose-Capillary 98  70 - 99 mg/dL    Comment 1 Notify RN     GLUCOSE, CAPILLARY     Status: Abnormal   Collection Time   01/23/12 11:23 AM      Component Value Range Comment   Glucose-Capillary 177 (*) 70 - 99 mg/dL    Comment 1 Notify RN     GLUCOSE, CAPILLARY     Status: Normal   Collection Time   01/23/12  4:18 PM      Component Value Range Comment   Glucose-Capillary 84  70 - 99 mg/dL    Comment 1 Notify RN     GLUCOSE, CAPILLARY     Status: Abnormal   Collection Time   01/23/12  9:12 PM      Component Value Range Comment   Glucose-Capillary 186 (*) 70 - 99 mg/dL    Comment 1 Notify RN        HEENT: normal Cardio: RRR Resp: CTA B/L GI: BS positive Extremity:  Edema L stump Skin:   Wound ABD pads clean Neuro: Alert/Oriented, Anxious and Normal Motor Musc/Skel:  Other L BKA Wound clean and there was mild serosang drainage because of  the fall, wound remains nicely approximated though.. No drainage. Limb is well formed. ACE fitting appropriately  Assessment/Plan: 1. Functional deficits secondary to L BKA  which require 3+ hours per day of interdisciplinary therapy in a comprehensive inpatient rehab setting. Physiatrist is providing close team supervision and 24 hour management of active medical problems listed below. Physiatrist and rehab team continue to assess barriers to discharge/monitor patient progress toward functional and medical goals. FIM: FIM - Bathing Bathing Steps Patient Completed: Chest;Right Arm;Left Arm;Abdomen;Front perineal area;Buttocks;Right upper leg;Left upper leg;Right lower leg (including foot) Bathing: 5: Supervision: Safety issues/verbal cues  FIM - Upper Body Dressing/Undressing Upper body dressing/undressing steps patient completed: Thread/unthread left sleeve of pullover shirt/dress;Thread/unthread left bra strap;Thread/unthread right bra strap;Hook/unhook bra;Thread/unthread right sleeve of pullover shirt/dresss;Put head through opening of pull over shirt/dress;Pull shirt over trunk Upper body dressing/undressing: 7: Complete Independence: No helper FIM - Lower Body Dressing/Undressing Lower body dressing/undressing steps patient completed: Thread/unthread right  underwear leg;Thread/unthread left underwear leg;Pull underwear up/down;Thread/unthread right pants leg;Thread/unthread left pants leg;Pull pants up/down;Don/Doff right sock;Don/Doff right shoe;Fasten/unfasten right shoe Lower body dressing/undressing: 5: Supervision: Safety issues/verbal cues  FIM - Toileting Toileting steps completed by patient: Adjust clothing prior to toileting;Performs perineal hygiene;Adjust clothing after toileting Toileting Assistive Devices: Grab bar or rail for support Toileting: 0: Activity did not occur  FIM - Diplomatic Services operational officer Devices: Elevated toilet seat;Grab bars Toilet Transfers: 0-Activity did not occur  FIM - Banker Devices: Arm rests;Walker Bed/Chair Transfer: 5: Supine > Sit: Supervision (verbal cues/safety issues);5: Sit > Supine: Supervision (verbal cues/safety issues);5: Bed > Chair or W/C: Supervision (verbal cues/safety issues);5: Chair or W/C > Bed: Supervision (verbal cues/safety issues)  FIM - Locomotion: Wheelchair Distance: 60 Locomotion: Wheelchair: 5: Travels 50 - 149 ft, turns around, maneuvers to table, bed or toilet, negotiates 3% grade: modified independent FIM - Locomotion: Ambulation Locomotion: Ambulation Assistive Devices: Designer, industrial/product Ambulation/Gait Assistance: 4: Min assist Locomotion: Ambulation: 1: Travels less than 50 ft with minimal assistance (Pt.>75%)  Comprehension Comprehension Mode: Auditory Comprehension: 7-Follows complex conversation/direction: With no assist  Expression Expression Mode: Verbal Expression: 7-Expresses complex ideas: With no assist  Social Interaction Social Interaction: 7-Interacts appropriately with others - No medications needed.  Problem Solving Problem Solving: 5-Solves complex 90% of the time/cues < 10% of the time  Memory Memory: 7-Complete Independence: No helper  Medical Problem List and Plan:  1. DVT  Prophylaxis/Anticoagulation: Pharmaceutical: Lovenox  2. Pain Management: reports oxycodone ineffective. Will start MS contin for consistent pain relief with prn MSIR.  3. Mood: seems to have good outlook. Will have LCSW follow up for formal evaluation.  4. Neuropsych: This patient is capable of making decisions on his/her own behalf.  5. DM type 2: Monitor with AC/HSCBG checks. Will continue amaryl, decrease metformin to 250mg . Dc  lantus. prn meal coverage and titrate lantus as needed. Sugars were reasonable yesterday 6. Hypokalemia: Likely dilutional. supplemented  7. ABLA: Add iron supplement. Will recheck on Monday.  8. Wound: ACE dressing. Can switch to shrinker on Monday perhaps 9. Uro: check urine culture and analysis    LOS (Days) 8 A FACE TO FACE EVALUATION WAS PERFORMED  Nocholas Damaso T 01/24/2012, 7:19 AM

## 2012-01-25 ENCOUNTER — Inpatient Hospital Stay (HOSPITAL_COMMUNITY): Payer: Managed Care, Other (non HMO) | Admitting: *Deleted

## 2012-01-25 LAB — GLUCOSE, CAPILLARY

## 2012-01-25 NOTE — Progress Notes (Addendum)
Physical Therapy Note  Patient Details  Name: [name] MRN: [id number] Date of Birth: 1955-12-30 Today's Date: 01/25/2012  1445 -1540 (55 minutes) group Pain: no complaint of pain Pt participated in PT group session focused on UE strength/endurance including- ball toss for warmup X 5 minutes standing balance activity with RW SBA; ; gait - 20 feet RW  Min to SBA; wc mobility 120 feet SBA to mod independent in controlled environment; wc pushups 2 X 10.   [name],JIM 01/25/2012, 8:36 AM

## 2012-01-25 NOTE — Progress Notes (Signed)
Patient ID: [name], female   DOB: [birth year], 56 y.o.   MRN: [id number]  Subjective/Complaints: Mild drainage from wound. Leg still tender.   No pain C/os Objective: Vital Signs: Blood pressure 165/68, pulse 83, temperature 98.4 F (36.9 C), temperature source Oral, resp. rate 17, height 5\' 6"  (1.676 m), weight 99.7 kg (219 lb 12.8 oz), SpO2 100.00%. No results found. Results for orders placed during the hospital encounter of 01/16/12 (from the past 72 hour(s))  GLUCOSE, CAPILLARY     Status: Abnormal   Collection Time   01/22/12 11:19 AM      Component Value Range Comment   Glucose-Capillary 152 (*) 70 - 99 mg/dL    Comment 1 Notify RN     GLUCOSE, CAPILLARY     Status: Normal   Collection Time   01/22/12  4:19 PM      Component Value Range Comment   Glucose-Capillary 87  70 - 99 mg/dL    Comment 1 Notify RN     GLUCOSE, CAPILLARY     Status: Abnormal   Collection Time   01/22/12  9:07 PM      Component Value Range Comment   Glucose-Capillary 130 (*) 70 - 99 mg/dL    Comment 1 Notify RN     GLUCOSE, CAPILLARY     Status: Normal   Collection Time   01/23/12  7:09 AM      Component Value Range Comment   Glucose-Capillary 98  70 - 99 mg/dL    Comment 1 Notify RN     GLUCOSE, CAPILLARY     Status: Abnormal   Collection Time   01/23/12 11:23 AM      Component Value Range Comment   Glucose-Capillary 177 (*) 70 - 99 mg/dL    Comment 1 Notify RN     GLUCOSE, CAPILLARY     Status: Normal   Collection Time   01/23/12  4:18 PM      Component Value Range Comment   Glucose-Capillary 84  70 - 99 mg/dL    Comment 1 Notify RN     GLUCOSE, CAPILLARY     Status: Abnormal   Collection Time   01/23/12  9:12 PM      Component Value Range Comment   Glucose-Capillary 186 (*) 70 - 99 mg/dL    Comment 1 Notify RN     GLUCOSE, CAPILLARY     Status: Abnormal   Collection Time   01/24/12 11:05 AM      Component Value Range Comment   Glucose-Capillary 132 (*) 70 - 99 mg/dL    Comment 1 Notify RN      URINALYSIS, ROUTINE W REFLEX MICROSCOPIC     Status: Abnormal   Collection Time   01/24/12  2:45 PM      Component Value Range Comment   Color, Urine YELLOW  YELLOW    APPearance CLEAR  CLEAR    Specific Gravity, Urine 1.009  1.005 - 1.030    pH 6.0  5.0 - 8.0    Glucose, UA NEGATIVE  NEGATIVE mg/dL    Hgb urine dipstick NEGATIVE  NEGATIVE    Bilirubin Urine NEGATIVE  NEGATIVE    Ketones, ur NEGATIVE  NEGATIVE mg/dL    Protein, ur NEGATIVE  NEGATIVE mg/dL    Urobilinogen, UA 0.2  0.0 - 1.0 mg/dL    Nitrite NEGATIVE  NEGATIVE    Leukocytes, UA SMALL (*) NEGATIVE   URINE MICROSCOPIC-ADD ON     Status:  Abnormal   Collection Time   01/24/12  2:45 PM      Component Value Range Comment   WBC, UA 3-6  <3 WBC/hpf    Bacteria, UA FEW (*) RARE    Urine-Other MANY YEAST     GLUCOSE, CAPILLARY     Status: Abnormal   Collection Time   01/24/12  4:53 PM      Component Value Range Comment   Glucose-Capillary 116 (*) 70 - 99 mg/dL   GLUCOSE, CAPILLARY     Status: Normal   Collection Time   01/24/12  8:49 PM      Component Value Range Comment   Glucose-Capillary 95  70 - 99 mg/dL    Comment 1 Notify RN        HEENT: normal Cardio: RRR Resp: CTA B/L GI: BS positive Extremity:  Edema L stump Skin:   Wound ABD pads clean Neuro: Alert/Oriented, Anxious and Normal Motor Musc/Skel:  Other L BKA Wound clean and there was mild serosang drainage because of the fall, wound remains nicely approximated though.. No drainage. Limb is well formed. ACE fitting appropriately  Assessment/Plan: 1. Functional deficits secondary to L BKA  which require 3+ hours per day of interdisciplinary therapy in a comprehensive inpatient rehab setting. Physiatrist is providing close team supervision and 24 hour management of active medical problems listed below. Physiatrist and rehab team continue to assess barriers to discharge/monitor patient progress toward functional and medical goals. FIM: FIM - Bathing Bathing  Steps Patient Completed: Chest;Right Arm;Left Arm;Abdomen;Front perineal area;Buttocks;Right upper leg;Left upper leg;Right lower leg (including foot) Bathing: 5: Supervision: Safety issues/verbal cues  FIM - Upper Body Dressing/Undressing Upper body dressing/undressing steps patient completed: Thread/unthread left sleeve of pullover shirt/dress;Thread/unthread left bra strap;Thread/unthread right bra strap;Hook/unhook bra;Thread/unthread right sleeve of pullover shirt/dresss;Put head through opening of pull over shirt/dress;Pull shirt over trunk Upper body dressing/undressing: 7: Complete Independence: No helper FIM - Lower Body Dressing/Undressing Lower body dressing/undressing steps patient completed: Thread/unthread right underwear leg;Thread/unthread left underwear leg;Pull underwear up/down;Thread/unthread right pants leg;Thread/unthread left pants leg;Pull pants up/down;Don/Doff right sock;Don/Doff right shoe;Fasten/unfasten right shoe Lower body dressing/undressing: 5: Supervision: Safety issues/verbal cues  FIM - Toileting Toileting steps completed by patient: Performs perineal hygiene;Adjust clothing prior to toileting;Adjust clothing after toileting Toileting Assistive Devices: Grab bar or rail for support Toileting: 4: Steadying assist  FIM - Diplomatic Services operational officer Devices: Elevated toilet seat;Grab bars Toilet Transfers: 0-Activity did not occur  FIM - Banker Devices: Bed rails;Arm rests Bed/Chair Transfer: 5: Supine > Sit: Supervision (verbal cues/safety issues);5: Sit > Supine: Supervision (verbal cues/safety issues);5: Bed > Chair or W/C: Supervision (verbal cues/safety issues);5: Chair or W/C > Bed: Supervision (verbal cues/safety issues)  FIM - Locomotion: Wheelchair Distance: 60 Locomotion: Wheelchair: 5: Travels 50 - 149 ft, turns around, maneuvers to table, bed or toilet, negotiates 3% grade: modified  independent FIM - Locomotion: Ambulation Locomotion: Ambulation Assistive Devices: Designer, industrial/product Ambulation/Gait Assistance: 4: Min assist Locomotion: Ambulation: 1: Travels less than 50 ft with minimal assistance (Pt.>75%)  Comprehension Comprehension Mode: Auditory Comprehension: 7-Follows complex conversation/direction: With no assist  Expression Expression Mode: Verbal Expression: 7-Expresses complex ideas: With no assist  Social Interaction Social Interaction: 7-Interacts appropriately with others - No medications needed.  Problem Solving Problem Solving: 6-Solves complex problems: With extra time  Memory Memory: 7-Complete Independence: No helper  Medical Problem List and Plan:  1. DVT Prophylaxis/Anticoagulation: Pharmaceutical: Lovenox  2. Pain Management: reports oxycodone ineffective. Will start  MS contin for consistent pain relief with prn MSIR.  3. Mood: seems to have good outlook. Will have LCSW follow up for formal evaluation.  4. Neuropsych: This patient is capable of making decisions on his/her own behalf.  5. DM type 2: Monitor with AC/HSCBG checks. Will continue amaryl, decrease metformin to 250mg . Dc  lantus. prn meal coverage and titrate lantus as needed. Sugars were reasonable yesterday 6. Hypokalemia: Likely dilutional. supplemented  7. ABLA: Add iron supplement. Will recheck on Monday.  8. Wound: ACE dressing. Can switch to shrinker on Monday perhaps. No change in plan at present 9. Uro: a few yeast on dipstick. Await culture    LOS (Days) 9 A FACE TO FACE EVALUATION WAS PERFORMED  Shraddha Lebron T 01/25/2012, 7:26 AM

## 2012-01-26 ENCOUNTER — Inpatient Hospital Stay (HOSPITAL_COMMUNITY): Payer: Managed Care, Other (non HMO) | Admitting: Occupational Therapy

## 2012-01-26 ENCOUNTER — Inpatient Hospital Stay (HOSPITAL_COMMUNITY): Payer: Managed Care, Other (non HMO)

## 2012-01-26 DIAGNOSIS — I2510 Atherosclerotic heart disease of native coronary artery without angina pectoris: Secondary | ICD-10-CM

## 2012-01-26 DIAGNOSIS — L98499 Non-pressure chronic ulcer of skin of other sites with unspecified severity: Secondary | ICD-10-CM

## 2012-01-26 DIAGNOSIS — N390 Urinary tract infection, site not specified: Secondary | ICD-10-CM

## 2012-01-26 DIAGNOSIS — Z5189 Encounter for other specified aftercare: Secondary | ICD-10-CM

## 2012-01-26 DIAGNOSIS — S88119A Complete traumatic amputation at level between knee and ankle, unspecified lower leg, initial encounter: Secondary | ICD-10-CM

## 2012-01-26 DIAGNOSIS — I739 Peripheral vascular disease, unspecified: Secondary | ICD-10-CM

## 2012-01-26 DIAGNOSIS — E1165 Type 2 diabetes mellitus with hyperglycemia: Secondary | ICD-10-CM

## 2012-01-26 LAB — URINE CULTURE

## 2012-01-26 LAB — GLUCOSE, CAPILLARY
Glucose-Capillary: 103 mg/dL — ABNORMAL HIGH (ref 70–99)
Glucose-Capillary: 174 mg/dL — ABNORMAL HIGH (ref 70–99)
Glucose-Capillary: 109 mg/dL — ABNORMAL HIGH (ref 70–99)
Glucose-Capillary: 152 mg/dL — ABNORMAL HIGH (ref 70–99)

## 2012-01-26 LAB — CBC
Platelets: 330 10*3/uL (ref 150–400)
RBC: 3.76 MIL/uL — ABNORMAL LOW (ref 3.87–5.11)
WBC: 10.3 10*3/uL (ref 4.0–10.5)

## 2012-01-26 MED ORDER — CIPROFLOXACIN HCL 250 MG PO TABS
250.0000 mg | ORAL_TABLET | Freq: Two times a day (BID) | ORAL | Status: DC
  Administered 2012-01-26 – 2012-01-27 (×3): 250 mg via ORAL
  Filled 2012-01-26 (×8): qty 1

## 2012-01-26 NOTE — Progress Notes (Signed)
Physical Therapy Session Note  Patient Details  Name: [name] MRN: [id number] Date of Birth: Mar 25, 1956  Today's Date: 01/26/2012 Time: 4098-1191 Time Calculation (min): 55 min  Skilled Therapeutic Interventions/Progress Updates:    w/c propulsion on unit mod I x 150' with encouragement and extra time needed for endurance and mobility training. Simulated car transfer training using RW for SUV height multiple repetitions overall supervision, cueing needed to lock breaks before transfer. Gait training with RW x 20' with supervision for safety, rest break due to fatigue. Transfer training w/c <-> bed squat pivot technique with pt still requiring cueing for technique and need to keep at least one hand on surface or w/c during transfer. At this time pt is not mod I with basic transfers due to cueing needed for safety and technique. Pt reports that landlord is building a ramp and should be ready for tomorrow. Discussed fam ed this afternoon with sister to show how to bump up w/c if not finished with ramp on time. Pt calling sister at end of session to confirm coming in for family ed.  Therapy Documentation Precautions:  Precautions Precautions: Fall Required Braces or Orthoses: Other Brace/Splint Other Brace/Splint: Pt was given knee immobilizer. No orders stating what leg or wearing schedule. Restrictions Weight Bearing Restrictions: Yes LLE Weight Bearing: Non weight bearing LLE Partial Weight Bearing Percentage or Pounds: left BKA Other Position/Activity Restrictions: BKA    Pain: Pain Assessment Pain Assessment: No/denies pain Pain Score: 0-No pain    Locomotion : Ambulation Ambulation/Gait Assistance: 5: Supervision   See FIM for current functional status  Therapy/Group: Individual Therapy  [name] [facility] 01/26/2012, 10:18 AM

## 2012-01-26 NOTE — Progress Notes (Signed)
Physical Therapy Discharge Summary  Patient Details  Name: [name] MRN: [id number] Date of Birth: 09/05/1955  Today's Date: 01/26/2012 Time: 7829-5621 Time Calculation (min): 58 min Individual therapy  Treatment session with focus on family education with pt's sister and niece with emphasis on basic transfers, car transfers, bed mobility, w/c breakdown, LE HEP, safety in the home, and how to negotiate a curb step with w/c. Upon further discussion with sister, step into the house is not a single step but rather a 12-13" high step. Landlord plans to have the ramp completed prior to d/c tomorrow, so it should not be an issue. In the event that it was not complete, problem solved to have a chair set on the edge of the porch with pt to sit down and then transfer to w/c in order to access the front door. Family feels confident that the ramp will be completed today and if not, the method discussed would work well.    Patient has met 8 of 10 long term goals due to improved activity tolerance and improved balance.  Patient to discharge at a wheelchair level Supervision overall (mod I for propulsion of w/c). Patient's care partner is independent to provide the necessary supervision assistance at discharge. Family education successfully completed with pt's sister.  Reasons goals not met: Pt did not meet basic or car transfer goals due to need for supervision for cueing and safety with w/c parts, set up for transfers, and technique. Pt with decreased memory and unable to perform consistently at mod I. Recommended to pt and family to have supervision for transfers and gait.  Recommendation:  Patient will benefit from ongoing skilled PT services in home health setting to continue to advance safe functional mobility, address ongoing impairments in gait, balance, muscular endurance, activity tolerance, pre-prosthetic training, and minimize fall risk.  Equipment: 20x18 w/c with basic cushion and L amputee  pad  Reasons for discharge: discharge from hospital  Patient/family agrees with progress made and goals achieved: Yes  PT Discharge Precautions/Restrictions Precautions Precautions: Fall    Cognition Overall Cognitive Status: Impaired Memory: Impaired Awareness: Impaired Safety/Judgment: Impaired Sensation Sensation Light Touch: Appears Intact Coordination Gross Motor Movements are Fluid and Coordinated: Yes Motor  Motor Motor: Within Functional Limits    Locomotion  Ambulation Ambulation/Gait Assistance: 5: Supervision  Trunk/Postural Assessment  Cervical Assessment Cervical Assessment: Within Functional Limits Thoracic Assessment Thoracic Assessment: Within Functional Limits Lumbar Assessment Lumbar Assessment: Within Functional Limits  Balance Static Sitting Balance Static Sitting - Level of Assistance: 7: Independent Dynamic Sitting Balance Dynamic Sitting - Level of Assistance: 7: Independent Static Standing Balance Static Standing - Level of Assistance: 6: Modified independent (Device/Increase time) (with RW) Dynamic Standing Balance Dynamic Standing - Level of Assistance: 6: Modified independent (Device/Increase time) (with UE support) Extremity Assessment      RLE Assessment RLE Assessment: Within Functional Limits LLE Assessment LLE Assessment: Exceptions to WFL LLE AROM (degrees) LLE Overall AROM Comments: full ROM at hip, unable to assess knee d/t dressing limits knee flexion, full extention LLE Strength LLE Overall Strength:  (same as admission)  See FIM for current functional status  [name] [facility] 01/26/2012, 5:00 PM

## 2012-01-26 NOTE — Progress Notes (Signed)
Orthopedic Tech Progress Note Patient Details:  STARKISHA TULLIS 1955/10/17 [id number]  Patient ID: [name], female   DOB: 02/28/1956, 56 y.o.   MRN: [id number] Advanced rep delivered paperwork for pt and expressed that the pt had been fitted for brace. Order in Epic states that it was canceled 9/2.  Nell Gales T 01/26/2012, 4:30 PM

## 2012-01-26 NOTE — Progress Notes (Signed)
Occupational Therapy Session Notes  Patient Details  Name: [name] MRN: [id number] Date of Birth: 1955/10/30  Today's Date: 01/26/2012  Short Term Goals: Week 1:  OT Short Term Goal 1 (Week 1): Patient will perform bathing at bed level at Supervision. OT Short Term Goal 2 (Week 1): Patient will perform dressing at bed level at Supervision. OT Short Term Goal 3 (Week 1): Patient will perform toileting at Supervision level. OT Short Term Goal 4 (Week 1): Patient will perform Toilet transfer at Supervision level with bedside commode. OT Short Term Goal 5 (Week 1): Patient will perform tub/shower transfer at Supervision level with tub transfer bench.  Skilled Therapeutic Interventions/Progress Updates:   Session #1 [phone] - 45 Minutes Individual Therapy No complaints of pain Patient found seated in w/c. Treatment emphasis on ADL retraining at sink level in sit ->stand position. Focused skilled intervention on UB/LB bathing & dressing, sit/stands, dynamic standing balance/tolerance/endurance, and overall activity tolerance/endurance. After ADL patient propelled self -> therapy gym for UE exercises using SCIFIT machine. Therapist propelled patient back to room at end of session and left patient with call bell, phone, and breakfast tray within reach.   Session #2 1305-1330 - 25 Minutes Individual Therapy No complaints of pain Patient found seated edge of bed, asleep. Woke patient and engaged in edge of bed -> w/c transfer -> elevated toilet seat transfer for toileting. Patient with poor memory regarding safe transfers and poor problem solving. Therapist discussed importance of having someone present during transfers and to use AE (rolling walker or w/c) during transfers; discussed patient's recent fall. Patient then engaged in UE strengthening exercises using 4lb weighted bar. At end of session left patient seated in w/c beside bed with call bell & phone within reach.   Precautions:    Precautions Precautions: Fall Required Braces or Orthoses: Other Brace/Splint Other Brace/Splint: Pt was given knee immobilizer. No orders stating what leg or wearing schedule. Restrictions Weight Bearing Restrictions: Yes LLE Weight Bearing: Non weight bearing LLE Partial Weight Bearing Percentage or Pounds: left BKA Other Position/Activity Restrictions: BKA  See FIM for current functional status  Barett Whidbee 01/26/2012, 7:44 AM

## 2012-01-26 NOTE — Progress Notes (Signed)
Orthopedic Tech Progress Note Patient Details:  [name] [birth year] [id number]  Patient ID: [name], female   DOB: 04-03-1956, 56 y.o.   MRN: [id number] Brace order completed by advanced  Nikki Dom 01/26/2012, 5:51 PM

## 2012-01-26 NOTE — Plan of Care (Signed)
Problem: RH Bed to Chair Transfers Goal: LTG Patient will perform bed/chair transfers w/assist (PT) LTG: Patient will perform bed/chair transfers with assistance, with/without cues (PT).  Outcome: Not Met (add Reason) Requiring supervision for cueing and safety  Problem: RH Car Transfers Goal: LTG Patient will perform car transfers with assist (PT) LTG: Patient will perform car transfers with assistance (PT).  Outcome: Not Met (add Reason) Requiring supervision for safety and cueing

## 2012-01-26 NOTE — Progress Notes (Signed)
Patient ID: [name], female   DOB: 1956/04/09, 56 y.o.   MRN: [id number]  Subjective/Complaints: No further drainage from leg. Feeling beter today. No pain C/os Objective: Vital Signs: Blood pressure 136/63, pulse 76, temperature 97.8 F (36.6 C), temperature source Oral, resp. rate 19, height 5\' 6"  (1.676 m), weight 99.7 kg (219 lb 12.8 oz), SpO2 100.00%. No results found. Results for orders placed during the hospital encounter of 01/16/12 (from the past 72 hour(s))  GLUCOSE, CAPILLARY     Status: Abnormal   Collection Time   01/23/12 11:23 AM      Component Value Range Comment   Glucose-Capillary 177 (*) 70 - 99 mg/dL    Comment 1 Notify RN     GLUCOSE, CAPILLARY     Status: Normal   Collection Time   01/23/12  4:18 PM      Component Value Range Comment   Glucose-Capillary 84  70 - 99 mg/dL    Comment 1 Notify RN     GLUCOSE, CAPILLARY     Status: Abnormal   Collection Time   01/23/12  9:12 PM      Component Value Range Comment   Glucose-Capillary 186 (*) 70 - 99 mg/dL    Comment 1 Notify RN     GLUCOSE, CAPILLARY     Status: Abnormal   Collection Time   01/24/12 11:05 AM      Component Value Range Comment   Glucose-Capillary 132 (*) 70 - 99 mg/dL    Comment 1 Notify RN     URINE CULTURE     Status: Normal (Preliminary result)   Collection Time   01/24/12  2:45 PM      Component Value Range Comment   Specimen Description URINE, CLEAN CATCH      Special Requests NONE      Culture  Setup Time 01/24/2012 20:47      Colony Count 15,000 COLONIES/ML      Culture GRAM NEGATIVE RODS      Report Status PENDING     URINALYSIS, ROUTINE W REFLEX MICROSCOPIC     Status: Abnormal   Collection Time   01/24/12  2:45 PM      Component Value Range Comment   Color, Urine YELLOW  YELLOW    APPearance CLEAR  CLEAR    Specific Gravity, Urine 1.009  1.005 - 1.030    pH 6.0  5.0 - 8.0    Glucose, UA NEGATIVE  NEGATIVE mg/dL    Hgb urine dipstick NEGATIVE  NEGATIVE    Bilirubin Urine  NEGATIVE  NEGATIVE    Ketones, ur NEGATIVE  NEGATIVE mg/dL    Protein, ur NEGATIVE  NEGATIVE mg/dL    Urobilinogen, UA 0.2  0.0 - 1.0 mg/dL    Nitrite NEGATIVE  NEGATIVE    Leukocytes, UA SMALL (*) NEGATIVE   URINE MICROSCOPIC-ADD ON     Status: Abnormal   Collection Time   01/24/12  2:45 PM      Component Value Range Comment   WBC, UA 3-6  <3 WBC/hpf    Bacteria, UA FEW (*) RARE    Urine-Other MANY YEAST     GLUCOSE, CAPILLARY     Status: Abnormal   Collection Time   01/24/12  4:53 PM      Component Value Range Comment   Glucose-Capillary 116 (*) 70 - 99 mg/dL   GLUCOSE, CAPILLARY     Status: Normal   Collection Time   01/24/12  8:49 PM  Component Value Range Comment   Glucose-Capillary 95  70 - 99 mg/dL    Comment 1 Notify RN     GLUCOSE, CAPILLARY     Status: Abnormal   Collection Time   01/25/12  8:05 AM      Component Value Range Comment   Glucose-Capillary 121 (*) 70 - 99 mg/dL   GLUCOSE, CAPILLARY     Status: Abnormal   Collection Time   01/25/12 11:41 AM      Component Value Range Comment   Glucose-Capillary 141 (*) 70 - 99 mg/dL   GLUCOSE, CAPILLARY     Status: Normal   Collection Time   01/25/12  4:17 PM      Component Value Range Comment   Glucose-Capillary 71  70 - 99 mg/dL   GLUCOSE, CAPILLARY     Status: Abnormal   Collection Time   01/25/12  8:42 PM      Component Value Range Comment   Glucose-Capillary 108 (*) 70 - 99 mg/dL    Comment 1 Notify RN     GLUCOSE, CAPILLARY     Status: Abnormal   Collection Time   01/26/12  7:23 AM      Component Value Range Comment   Glucose-Capillary 103 (*) 70 - 99 mg/dL    Comment 1 Notify RN        HEENT: normal Cardio: RRR Resp: CTA B/L GI: BS positive Extremity:  Edema L stump Skin:   Wound ABD pads clean Neuro: Alert/Oriented, Anxious and Normal Motor Musc/Skel:  Other L BKA Wound clean and there was mild serosang drainage because of the fall, wound remains nicely approximated though.. No drainage. Limb is well  formed. ACE fitting appropriately  Assessment/Plan: 1. Functional deficits secondary to L BKA  which require 3+ hours per day of interdisciplinary therapy in a comprehensive inpatient rehab setting. Physiatrist is providing close team supervision and 24 hour management of active medical problems listed below. Physiatrist and rehab team continue to assess barriers to discharge/monitor patient progress toward functional and medical goals. FIM: FIM - Bathing Bathing Steps Patient Completed: Chest;Right Arm;Left Arm;Abdomen;Front perineal area;Buttocks;Right upper leg;Left upper leg;Right lower leg (including foot) Bathing: 5: Supervision: Safety issues/verbal cues  FIM - Upper Body Dressing/Undressing Upper body dressing/undressing steps patient completed: Thread/unthread right bra strap;Thread/unthread left bra strap;Thread/unthread right sleeve of pullover shirt/dresss;Hook/unhook bra;Thread/unthread left sleeve of pullover shirt/dress;Put head through opening of pull over shirt/dress;Pull shirt over trunk Upper body dressing/undressing: 7: Complete Independence: No helper FIM - Lower Body Dressing/Undressing Lower body dressing/undressing steps patient completed: Thread/unthread right underwear leg;Thread/unthread left underwear leg;Pull underwear up/down;Thread/unthread right pants leg;Thread/unthread left pants leg;Pull pants up/down;Don/Doff right sock Lower body dressing/undressing: 5: Supervision: Safety issues/verbal cues  FIM - Toileting Toileting steps completed by patient: Performs perineal hygiene;Adjust clothing prior to toileting;Adjust clothing after toileting Toileting Assistive Devices: Grab bar or rail for support Toileting: 0: Activity did not occur  FIM - Diplomatic Services operational officer Devices: Elevated toilet seat;Grab bars Toilet Transfers: 0-Activity did not occur  FIM - Banker Devices: Bed rails;Arm  rests Bed/Chair Transfer: 5: Supine > Sit: Supervision (verbal cues/safety issues);5: Sit > Supine: Supervision (verbal cues/safety issues);5: Bed > Chair or W/C: Supervision (verbal cues/safety issues);5: Chair or W/C > Bed: Supervision (verbal cues/safety issues)  FIM - Locomotion: Wheelchair Distance: 60 Locomotion: Wheelchair: 5: Travels 50 - 149 ft, turns around, maneuvers to table, bed or toilet, negotiates 3% grade: modified independent FIM - Locomotion: Ambulation Locomotion: Ambulation  Assistive Devices: Designer, industrial/product Ambulation/Gait Assistance: 4: Min assist Locomotion: Ambulation: 1: Travels less than 50 ft with minimal assistance (Pt.>75%)  Comprehension Comprehension Mode: Auditory Comprehension: 7-Follows complex conversation/direction: With no assist  Expression Expression Mode: Verbal Expression: 7-Expresses complex ideas: With no assist  Social Interaction Social Interaction: 7-Interacts appropriately with others - No medications needed.  Problem Solving Problem Solving: 5-Solves complex 90% of the time/cues < 10% of the time  Memory Memory: 7-Complete Independence: No helper  Medical Problem List and Plan:  1. DVT Prophylaxis/Anticoagulation: Pharmaceutical: Lovenox  2. Pain Management: reports oxycodone ineffective. Will start MS contin for consistent pain relief with prn MSIR.  3. Mood: seems to have good outlook. Will have LCSW follow up for formal evaluation.  4. Neuropsych: This patient is capable of making decisions on his/her own behalf.  5. DM type 2: Monitor with AC/HSCBG checks. Will continue amaryl, decrease metformin to 250mg . Dc  lantus. prn meal coverage and titrate lantus as needed. Sugars were reasonable yesterday 6. Hypokalemia: Likely dilutional. supplemented  7. ABLA: Add iron supplement. Will recheck 8. Wound: ACE dressing. Can switch to shrinker on Monday perhaps. No change in plan at present 9. Uro: a few yeast on dipstick. Culture with  GNR. Will begin empiric cipro    LOS (Days) 10 A FACE TO FACE EVALUATION WAS PERFORMED  Maki Hege T 01/26/2012, 8:56 AM

## 2012-01-26 NOTE — Progress Notes (Signed)
Social Work Patient ID: [name], female   DOB: [birth year], 56 y.o.   MRN: [id number] Met with pt to discuss follow up therapies she had [facility] previous and wants them again.  [facility] to [facility] Pt asked again when her wheelchair was to be delivered, informed her again [name] does not deliver anymore and she or her family member Would need to go to the local office on [facility].  Work toward discharge for tomorrow.

## 2012-01-27 ENCOUNTER — Encounter (HOSPITAL_COMMUNITY): Payer: Managed Care, Other (non HMO) | Admitting: Occupational Therapy

## 2012-01-27 DIAGNOSIS — I2510 Atherosclerotic heart disease of native coronary artery without angina pectoris: Secondary | ICD-10-CM

## 2012-01-27 DIAGNOSIS — IMO0001 Reserved for inherently not codable concepts without codable children: Secondary | ICD-10-CM

## 2012-01-27 DIAGNOSIS — I739 Peripheral vascular disease, unspecified: Secondary | ICD-10-CM

## 2012-01-27 DIAGNOSIS — E1165 Type 2 diabetes mellitus with hyperglycemia: Secondary | ICD-10-CM

## 2012-01-27 DIAGNOSIS — S88119A Complete traumatic amputation at level between knee and ankle, unspecified lower leg, initial encounter: Secondary | ICD-10-CM

## 2012-01-27 DIAGNOSIS — Z5189 Encounter for other specified aftercare: Secondary | ICD-10-CM

## 2012-01-27 DIAGNOSIS — L98499 Non-pressure chronic ulcer of skin of other sites with unspecified severity: Secondary | ICD-10-CM

## 2012-01-27 LAB — GLUCOSE, CAPILLARY

## 2012-01-27 MED ORDER — MORPHINE SULFATE ER 15 MG PO TBCR: 15.0000 mg | EXTENDED_RELEASE_TABLET | Freq: Two times a day (BID) | ORAL | Status: DC

## 2012-01-27 MED ORDER — MORPHINE SULFATE 15 MG PO TABS: 15.0000 mg | ORAL_TABLET | ORAL | Status: DC | PRN

## 2012-01-27 MED ORDER — METFORMIN HCL 500 MG PO TABS: 250.0000 mg | ORAL_TABLET | Freq: Two times a day (BID) | ORAL | Status: DC

## 2012-01-27 MED ORDER — CIPROFLOXACIN HCL 250 MG PO TABS: 250.0000 mg | ORAL_TABLET | Freq: Two times a day (BID) | ORAL | Status: DC

## 2012-01-27 MED ORDER — METHOCARBAMOL 500 MG PO TABS: 500.0000 mg | ORAL_TABLET | Freq: Four times a day (QID) | ORAL | Status: DC | PRN

## 2012-01-27 MED ORDER — POLYSACCHARIDE IRON COMPLEX 150 MG PO CAPS: 150.0000 mg | ORAL_CAPSULE | Freq: Every day | ORAL | Status: DC

## 2012-01-27 MED ORDER — INSULIN ASPART 100 UNIT/ML ~~LOC~~ SOLN: 2.0000 [IU] | Freq: Three times a day (TID) | SUBCUTANEOUS | Status: DC

## 2012-01-27 NOTE — Progress Notes (Signed)
Social Work Patient ID: [name], female   DOB: February 15, 1956, 56 y.o.   MRN: [id number] Spoke with caroline Coleman-Insurance CM to inform of discharge today.  Has info she needs.

## 2012-01-27 NOTE — Progress Notes (Signed)
Patient ID: [name], female   DOB: [birth year], 56 y.o.   MRN: [id number]  Subjective/Complaints: Excited to go home.  No pain C/os Objective: Vital Signs: Blood pressure 128/73, pulse 86, temperature 98.2 F (36.8 C), temperature source Oral, resp. rate 19, height 5\' 6"  (1.676 m), weight 99.7 kg (219 lb 12.8 oz), SpO2 99.00%. No results found. Results for orders placed during the hospital encounter of 01/16/12 (from the past 72 hour(s))  GLUCOSE, CAPILLARY     Status: Abnormal   Collection Time   01/24/12 11:05 AM      Component Value Range Comment   Glucose-Capillary 132 (*) 70 - 99 mg/dL    Comment 1 Notify RN     URINE CULTURE     Status: Normal   Collection Time   01/24/12  2:45 PM      Component Value Range Comment   Specimen Description URINE, CLEAN CATCH      Special Requests NONE      Culture  Setup Time 01/24/2012 20:47      Colony Count 15,000 COLONIES/ML      Culture KLEBSIELLA PNEUMONIAE      Report Status 01/26/2012 FINAL      Organism ID, Bacteria KLEBSIELLA PNEUMONIAE     URINALYSIS, ROUTINE W REFLEX MICROSCOPIC     Status: Abnormal   Collection Time   01/24/12  2:45 PM      Component Value Range Comment   Color, Urine YELLOW  YELLOW    APPearance CLEAR  CLEAR    Specific Gravity, Urine 1.009  1.005 - 1.030    pH 6.0  5.0 - 8.0    Glucose, UA NEGATIVE  NEGATIVE mg/dL    Hgb urine dipstick NEGATIVE  NEGATIVE    Bilirubin Urine NEGATIVE  NEGATIVE    Ketones, ur NEGATIVE  NEGATIVE mg/dL    Protein, ur NEGATIVE  NEGATIVE mg/dL    Urobilinogen, UA 0.2  0.0 - 1.0 mg/dL    Nitrite NEGATIVE  NEGATIVE    Leukocytes, UA SMALL (*) NEGATIVE   URINE MICROSCOPIC-ADD ON     Status: Abnormal   Collection Time   01/24/12  2:45 PM      Component Value Range Comment   WBC, UA 3-6  <3 WBC/hpf    Bacteria, UA FEW (*) RARE    Urine-Other MANY YEAST     GLUCOSE, CAPILLARY     Status: Abnormal   Collection Time   01/24/12  4:53 PM      Component Value Range Comment   Glucose-Capillary 116 (*) 70 - 99 mg/dL   GLUCOSE, CAPILLARY     Status: Normal   Collection Time   01/24/12  8:49 PM      Component Value Range Comment   Glucose-Capillary 95  70 - 99 mg/dL    Comment 1 Notify RN     GLUCOSE, CAPILLARY     Status: Abnormal   Collection Time   01/25/12  8:05 AM      Component Value Range Comment   Glucose-Capillary 121 (*) 70 - 99 mg/dL   GLUCOSE, CAPILLARY     Status: Abnormal   Collection Time   01/25/12 11:41 AM      Component Value Range Comment   Glucose-Capillary 141 (*) 70 - 99 mg/dL   GLUCOSE, CAPILLARY     Status: Normal   Collection Time   01/25/12  4:17 PM      Component Value Range Comment   Glucose-Capillary 71  70 -  99 mg/dL   GLUCOSE, CAPILLARY     Status: Abnormal   Collection Time   01/25/12  8:42 PM      Component Value Range Comment   Glucose-Capillary 108 (*) 70 - 99 mg/dL    Comment 1 Notify RN     GLUCOSE, CAPILLARY     Status: Abnormal   Collection Time   01/26/12  7:23 AM      Component Value Range Comment   Glucose-Capillary 103 (*) 70 - 99 mg/dL    Comment 1 Notify RN     CBC     Status: Abnormal   Collection Time   01/26/12 10:20 AM      Component Value Range Comment   WBC 10.3  4.0 - 10.5 K/uL    RBC 3.76 (*) 3.87 - 5.11 MIL/uL    Hemoglobin 10.0 (*) 12.0 - 15.0 g/dL    HCT 56.2 (*) 13.0 - 46.0 %    MCV 84.0  78.0 - 100.0 fL    MCH 26.6  26.0 - 34.0 pg    MCHC 31.6  30.0 - 36.0 g/dL    RDW 86.5  78.4 - 69.6 %    Platelets 330  150 - 400 K/uL   GLUCOSE, CAPILLARY     Status: Abnormal   Collection Time   01/26/12 11:23 AM      Component Value Range Comment   Glucose-Capillary 174 (*) 70 - 99 mg/dL    Comment 1 Notify RN     GLUCOSE, CAPILLARY     Status: Abnormal   Collection Time   01/26/12  4:35 PM      Component Value Range Comment   Glucose-Capillary 109 (*) 70 - 99 mg/dL   GLUCOSE, CAPILLARY     Status: Abnormal   Collection Time   01/26/12  9:11 PM      Component Value Range Comment   Glucose-Capillary 152 (*)  70 - 99 mg/dL   GLUCOSE, CAPILLARY     Status: Abnormal   Collection Time   01/27/12  7:41 AM      Component Value Range Comment   Glucose-Capillary 138 (*) 70 - 99 mg/dL    Comment 1 Notify RN        HEENT: normal Cardio: RRR Resp: CTA B/L GI: BS positive Extremity:  Edema L stump Skin:   Wound ABD pads clean Neuro: Alert/Oriented, Anxious and Normal Motor Musc/Skel:  Other L BKA Wound clean and there was mild serosang drainage   wound remains nicely approximated though.. No drainage. Limb is well formed. shrinker  Assessment/Plan: 1. Functional deficits secondary to L BKA  which require 3+ hours per day of interdisciplinary therapy in a comprehensive inpatient rehab setting. Physiatrist is providing close team supervision and 24 hour management of active medical problems listed below. Physiatrist and rehab team continue to assess barriers to discharge/monitor patient progress toward functional and medical goals.  Discussed shrinker wear. i would only use one ply at present to avoid over pressure on leg. Continue dry dressing also. FIM: FIM - Bathing Bathing Steps Patient Completed: Chest;Right Arm;Left Arm;Abdomen;Front perineal area;Buttocks;Right upper leg;Left upper leg;Right lower leg (including foot) Bathing: 5: Supervision: Safety issues/verbal cues  FIM - Upper Body Dressing/Undressing Upper body dressing/undressing steps patient completed: Thread/unthread right bra strap;Thread/unthread left bra strap;Thread/unthread right sleeve of pullover shirt/dresss;Hook/unhook bra;Thread/unthread left sleeve of pullover shirt/dress;Put head through opening of pull over shirt/dress;Pull shirt over trunk Upper body dressing/undressing: 7: Complete Independence: No helper FIM - Lower  Body Dressing/Undressing Lower body dressing/undressing steps patient completed: Thread/unthread right underwear leg;Thread/unthread left underwear leg;Pull underwear up/down;Thread/unthread right pants  leg;Thread/unthread left pants leg;Pull pants up/down;Don/Doff right sock Lower body dressing/undressing: 5: Supervision: Safety issues/verbal cues  FIM - Toileting Toileting steps completed by patient: Adjust clothing prior to toileting;Performs perineal hygiene;Adjust clothing after toileting Toileting Assistive Devices: Grab bar or rail for support Toileting: 4: Steadying assist  FIM - Diplomatic Services operational officer Devices: Building control surveyor Transfers: 5-To toilet/BSC: Supervision (verbal cues/safety issues);4-From toilet/BSC: Min A (steadying Pt. > 75%)  FIM - Bed/Chair Transfer Bed/Chair Transfer Assistive Devices: Bed rails;Arm rests Bed/Chair Transfer: 6: Supine > Sit: No assist;6: Sit > Supine: No assist;5: Bed > Chair or W/C: Supervision (verbal cues/safety issues);5: Chair or W/C > Bed: Supervision (verbal cues/safety issues)  FIM - Locomotion: Wheelchair Distance: 60 Locomotion: Wheelchair: 6: Travels 150 ft or more, turns around, maneuvers to table, bed or toilet, negotiates 3% grade: maneuvers on rugs and over door sills independently FIM - Locomotion: Ambulation Locomotion: Ambulation Assistive Devices: Designer, industrial/product Ambulation/Gait Assistance: 5: Supervision Locomotion: Ambulation: 1: Travels less than 50 ft with supervision/safety issues  Comprehension Comprehension Mode: Auditory Comprehension: 7-Follows complex conversation/direction: With no assist  Expression Expression Mode: Verbal Expression: 7-Expresses complex ideas: With no assist  Social Interaction Social Interaction: 7-Interacts appropriately with others - No medications needed.  Problem Solving Problem Solving: 6-Solves complex problems: With extra time  Memory Memory: 6-More than reasonable amt of time  Medical Problem List and Plan:  1. DVT Prophylaxis/Anticoagulation: Pharmaceutical: Lovenox  2. Pain Management: reports oxycodone ineffective. Will start MS contin  for consistent pain relief with prn MSIR.  3. Mood: seems to have good outlook. Will have LCSW follow up for formal evaluation.  4. Neuropsych: This patient is capable of making decisions on his/her own behalf.  5. DM type 2: Monitor with AC/HSCBG checks. Will continue amaryl, decrease metformin to 250mg . Dc  lantus. prn meal coverage and titrate lantus as needed. Sugars were reasonable yesterday 6. Hypokalemia: Likely dilutional. supplemented  7. ABLA: Add iron supplement. Will recheck 8. Wound: ACE dressing. Can switch to shrinker on Monday perhaps. No change in plan at present 9. Uro: a few yeast on dipstick. Culture klebsiella, sensitive to cipro. Continue for 7 days    LOS (Days) 11 A FACE TO FACE EVALUATION WAS PERFORMED  SWARTZ,ZACHARY T 01/27/2012, 8:38 AM

## 2012-01-27 NOTE — Plan of Care (Signed)
Problem: RH SKIN INTEGRITY Goal: RH STG ABLE TO PERFORM INCISION/WOUND CARE W/ASSISTANCE STG Able To Perform Incision/Wound Care With [employer].  Outcome: Completed/Met Date Met:  01/27/12 Can change stump shrinker with assistance form caregiver

## 2012-01-27 NOTE — Progress Notes (Signed)
Social Work Discharge Note Discharge Note  The overall goal for the admission was met for:   Discharge location: Yes-HOME WITH FAMILY CHECKING IN/OUT ON  Length of Stay: Yes-11 DAYS  Discharge activity level: Yes-MOD/I LEVEL  Home/community participation: Yes  Services provided included: MD, RD, PT, OT, RN, TR, Pharmacy and SW  Financial Services: Private Insurance: [employer]  Follow-up services arranged: Home Health: [facility]-PT, Charity fundraiser, DME: [facility]-WHEELCHAIR & TUB BENCH and Patient/Family request agency HH: Community education officer DRIVEN, DME: INSURANCE DRIVEN  Comments (or additional information):PT AWARE SHE OR FAMILY NEED TO GO TO [facility] TO PICK UP DME THEY NO LONGER DELIVER DME-MADE AWARE Friday 9/6  Patient/Family verbalized understanding of follow-up arrangements: Yes  Individual responsible for coordination of the follow-up plan: SELF  Confirmed correct DME delivered: Lucy Chris 01/27/2012    Lucy Chris

## 2012-01-27 NOTE — Progress Notes (Signed)
Recreational Therapy Discharge Summary Patient Details  Name: [name] MRN: [id number] Date of Birth: 1955/11/27 Today's Date: 01/27/2012  Pt discharged home today with family to provide 24 hour care.  Pt with limited participation in TR services during LOS and did not participate in community reintegration as expected.  Education provided to pt on energy conservation and identification and negotiation of barriers in community settings.  Reasons for discharge: discharge from hospital Patient/family agrees with progress made and goals achieved: Yes  Bharath Bernstein 01/27/2012, 5:07 PM

## 2012-01-27 NOTE — Progress Notes (Signed)
Social Work Patient ID: [name], female   DOB: [birth year], 56 y.o.   MRN: [id number] Spoke with [facility] liaison regarding pt's wheelchair she reports [name] will get a wheelchair for pt.  Pt is aware she needs to pick it up and is awaiting Sister to get here to go over discharge instructions and then go to pick it up.  She has decided against a tub bench, will borrow from family member.   Pt is aware to contact this worker if wheelchair issues continue and I will follow up with insurance and [employer].  Pt is ready for discharge and comfortable with Going home.

## 2012-01-27 NOTE — Progress Notes (Signed)
Patient received written and verbal discharge instructions from Deatra Ina, PA. Patient denies any questions or concerns, aware of follow up appointments and home health to come out to house and she needs to pick up wheelchair. Personal belongings packed by patient. Patient taken down to private vehicle by NT via wheelchair. [place], [employer] Kerney Elon Jester

## 2012-01-27 NOTE — Progress Notes (Signed)
Occupational Therapy Discharge Summary  Patient Details  Name: [name] MRN: [id number] Date of Birth: 1955/11/21  Today's Date: 01/27/2012  Patient supposed to be seen this am for family education regarding BADLs, however patient's sister did not arrive on time to see occupational therapist for education. Patient currently at an overall supervision level for BADLs and requires supervision for safety with physical and cognitive aspects of self-care tasks. Patient with noted impaired problem solving, impaired memory, impaired safety awareness, and impaired safety/judgement. Recently, patient fell trying to ambulate to bathroom without AE (rolling walker or w/c) and without calling for assistance. Therapist explained the importance of having someone to assist and the importance of using AE (rolling walker or w/c). Patient nods in agreement & seems to understand, but therapist questions patient's comprehension level.  Patient's sister has been through physical therapy education regarding transfers, w/c management, functional ambulation, home environment gait, overall general safety, and positioning for residual limb.   Patient has met 8 of 9 long term goals due to improved activity tolerance, improved balance, postural control and ability to compensate for deficits.  Patient to discharge at overall supervision -> steady assist level.    Reasons goals not met: Patient did not meet toileting goal, patient currently requires occasional steady assist secondary to decreased dynamic standing and decreased problem solving.   Recommendation:  Patient will benefit from ongoing skilled OT services in home health setting to continue to advance functional skills in the area of BADL and IADLs.   Equipment: tub transfer bench, patient currently has BSC  Reasons for discharge: treatment goals met and discharge from hospital  Patient/family agrees with progress made and goals achieved:  Yes  Precautions/Restrictions  Precautions Precautions: Fall Restrictions Weight Bearing Restrictions: Yes LLE Weight Bearing: Non weight bearing Other Position/Activity Restrictions: L BKA  ADL - See FIM  Vision/Perception  Vision - History Baseline Vision: Wears glasses only for reading Patient Visual Report: No change from baseline Vision - Assessment Eye Alignment: Within Functional Limits Perception Perception: Within Functional Limits Praxis Praxis: Intact   Cognition Overall Cognitive Status: Impaired Arousal/Alertness: Awake/alert Orientation Level: Oriented X4 Memory: Impaired Awareness: Impaired Problem Solving: Impaired Safety/Judgment: Impaired  Sensation Sensation Additional Comments: bilateral UEs appear intact Coordination Gross Motor Movements are Fluid and Coordinated: Yes Fine Motor Movements are Fluid and Coordinated: Yes  Motor  - See Discharge Summary  Mobility - See Discharge Summary  Trunk/Postural Assessment - See Discharge Summary  Balance- See Discharge Summary  Extremity/Trunk Assessment RUE Assessment RUE Assessment: Within Functional Limits LUE Assessment LUE Assessment: Within Functional Limits  See FIM for current functional status  [name] 01/27/2012, 10:17 AM

## 2012-01-31 NOTE — Discharge Summary (Signed)
[name], [name] NO.:  [id number]  MEDICAL RECORD NO.:  [id number]  LOCATION:  4153                         FACILITY:  [facility]  PHYSICIAN:  Ranelle Oyster, M.D.DATE OF BIRTH:  [birth year]  DATE OF ADMISSION:  01/16/2012 DATE OF DISCHARGE:  01/27/2012                              DISCHARGE SUMMARY   DISCHARGE DIAGNOSES: 1. Left below-knee amputation. 2. Diabetes mellitus type 2. 3. Acute blood loss anemia. 4. Hypokalemia, dilutional resolved.  HISTORY OF PRESENT ILLNESS:  [name] is a 56 year old female with history of DM type 2, left foot wound with progressive gangrene and poor wound healing.  The patient elected to undergo left BKA on January 14, 2012, by Dr. Victorino Dike.  Postop has had issues with pain management. Therapy evaluations were done and CIR was recommended for progression.  PAST MEDICAL HISTORY:  Positive for nonobstructive coronary artery disease, hypertension, CVA, GERD, obesity, DM type 2, diaphragmatic hernia, IBS, history of cellulitis left foot, stroke.  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  FUNCTIONAL STATUS:  The patient was min assist for stand pivot transfers.  RECENT LABS:  Check of lytes from September 2 revealed sodium 137, potassium 3.8, chloride 100, CO2 of 26, BUN 15, creatinine 0.89, glucose 101.  LFTs done revealed a phos at 118, albumin 2.5, AST 11, ALT 12, total bili 0.2.  CBC from September 9 revealed hemoglobin 10.0, hematocrit 31.6, white count 10.3, platelets 330.  Urine culture of September 7 showed 15,000 colonies of Klebsiella pneumoniae.  HOSPITAL COURSE:  [name] was admitted to rehab on January 16, 2012, for inpatient therapies to consist of PT, OT at least 3 hours 5 days a week.  Past admission, physiatrist, rehab, RN, and therapy team have worked together to provide customized collaborative interdisciplinary care.  The  patient's blood pressures were monitored on b.i.d. basis and these were noted to be reasonably controlled.  BP at time of discharge at 128/73.  The patient's blood sugars were checked on before meal and at bedtime basis, and the patient was noted to have multiple hypoglycemic episodes despite good p.o. intake.  Her diabetes medicines were adjusted.  Lantus was discontinued and metformin was further decreased to 250 mg.  The patient's blood sugars at time of discharge are ranging from 70s to 103 range.  The patient is advised to continue dietary discretion past discharge and continue monitoring blood sugars on b.i.d. to t.i.d. basis with followup with MD for further titration of meds.  The patient's wound has been monitored along.  Left BKA is intact with sutures in place with minimal serosanguineous drainage. No evidence of infection noted.  She was started on Cipro for UTI and is to continue on this for 7 total days of antibiotic therapy.  During the patient's stay in rehab, weekly team conferences were held to monitor the patient's progress, set goals, as well as discuss barriers to discharge.  The patient made good progress in her ADL tasks and was overall at supervision level for BADLs.  She requires supervision due to issues with impaired problem solving, memory as well as safety awareness.  PT has  been working the patient on basic mobility, strengthening as well as balance.  The patient had shown overall improvement in activity tolerance and balance.  She is modified independent for wheelchair propulsion, supervision for transfers. She Is supervision for ambulating 20 feet with rolling walker with rest breaks Needed due to fatigue.  Family education was done with sister and niece with emphasis on basic transfers, lower extremity home exercise plan, safety as well as wheelchair breakdown.  Further followup home health therapies to continue past discharge.  On January 27, 2012, the  patient is discharged to home in improved condition.  DISCHARGE MEDICATIONS: 1. Cipro 250 mg p.o. b.i.d. 2. Niferex 150 mg p.o. per day. 3. Robaxin 500 mg p.o. q.6 hours p.r.n. spasms. 4. MS Contin 50 mg p.o. q.12 hours. 5. MSIR 15 mg p.o. q.4 hours p.r.n. pain. 6. NovoLog 3 units subcu t.i.d. with meals. 7. Metformin 250 mg p.o. b.i.d. 8. Plavix 75 mg p.o. per day. 9. Amaryl 4 mg p.o. per day. 10.Lisinopril 20 mg p.o. per day. 11.Senna 2 tabs p.o. b.i.d. 12.Zocor 40 mg p.o. per day.  DIET:  Carb modified medium.  ACTIVITY:  As tolerated, 24-hour supervision.  SPECIAL INSTRUCTIONS:  Check blood sugars before meals and at bedtime, use home sliding scale insulin, eat snack at bedtime if blood sugar less than 150.  Baby aspirin 81 mg a day for a month past discharge.  [facility] to provide PT and RN.  FOLLOWUP:  The patient to follow up with Dr. Victorino Dike on September 11 at 3 p.m.  Follow up with Dr. Riley Kill, October 1 at 11:30 a.m.  Follow up with Dr. Mikeal Hawthorne on September 18 at 2:15 p.m.     [name], P.A.   ______________________________ Ranelle Oyster, M.D.    PL/MEDQ  D:  01/30/2012  T:  01/31/2012  Job:  [id number]  cc:   Toni Arthurs, MD [name], M.D.

## 2012-02-01 ENCOUNTER — Emergency Department (HOSPITAL_COMMUNITY): Payer: Managed Care, Other (non HMO)

## 2012-02-01 ENCOUNTER — Encounter (HOSPITAL_COMMUNITY): Payer: Self-pay | Admitting: *Deleted

## 2012-02-01 ENCOUNTER — Emergency Department (HOSPITAL_COMMUNITY)
Admission: EM | Admit: 2012-02-01 | Discharge: 2012-02-02 | Disposition: A | Discharge status: Home / Self Care | Payer: Managed Care, Other (non HMO) | Attending: Emergency Medicine | Admitting: Emergency Medicine

## 2012-02-01 DIAGNOSIS — I2510 Atherosclerotic heart disease of native coronary artery without angina pectoris: Secondary | ICD-10-CM | POA: Insufficient documentation

## 2012-02-01 DIAGNOSIS — S80819A Abrasion, unspecified lower leg, initial encounter: Secondary | ICD-10-CM

## 2012-02-01 DIAGNOSIS — K589 Irritable bowel syndrome without diarrhea: Secondary | ICD-10-CM | POA: Insufficient documentation

## 2012-02-01 DIAGNOSIS — Z79899 Other long term (current) drug therapy: Secondary | ICD-10-CM | POA: Insufficient documentation

## 2012-02-01 DIAGNOSIS — I10 Essential (primary) hypertension: Secondary | ICD-10-CM | POA: Insufficient documentation

## 2012-02-01 DIAGNOSIS — E785 Hyperlipidemia, unspecified: Secondary | ICD-10-CM | POA: Insufficient documentation

## 2012-02-01 DIAGNOSIS — K219 Gastro-esophageal reflux disease without esophagitis: Secondary | ICD-10-CM | POA: Insufficient documentation

## 2012-02-01 DIAGNOSIS — S8010XA Contusion of unspecified lower leg, initial encounter: Secondary | ICD-10-CM | POA: Insufficient documentation

## 2012-02-01 DIAGNOSIS — Z8673 Personal history of transient ischemic attack (TIA), and cerebral infarction without residual deficits: Secondary | ICD-10-CM | POA: Insufficient documentation

## 2012-02-01 DIAGNOSIS — S88119A Complete traumatic amputation at level between knee and ankle, unspecified lower leg, initial encounter: Secondary | ICD-10-CM | POA: Insufficient documentation

## 2012-02-01 DIAGNOSIS — F17200 Nicotine dependence, unspecified, uncomplicated: Secondary | ICD-10-CM | POA: Insufficient documentation

## 2012-02-01 DIAGNOSIS — E119 Type 2 diabetes mellitus without complications: Secondary | ICD-10-CM | POA: Insufficient documentation

## 2012-02-01 DIAGNOSIS — E669 Obesity, unspecified: Secondary | ICD-10-CM

## 2012-02-01 DIAGNOSIS — W19XXXA Unspecified fall, initial encounter: Secondary | ICD-10-CM | POA: Insufficient documentation

## 2012-02-01 DIAGNOSIS — Z794 Long term (current) use of insulin: Secondary | ICD-10-CM | POA: Insufficient documentation

## 2012-02-01 LAB — COMPREHENSIVE METABOLIC PANEL
ALT: 30 U/L (ref 0–35)
Alkaline Phosphatase: 135 U/L — ABNORMAL HIGH (ref 39–117)
BUN: 18 mg/dL (ref 6–23)
Chloride: 99 mEq/L (ref 96–112)
GFR calc Af Amer: 72 mL/min — ABNORMAL LOW (ref >90)
Glucose, Bld: 186 mg/dL — ABNORMAL HIGH (ref 70–99)
Potassium: 3.6 mEq/L (ref 3.5–5.1)
Sodium: 136 mEq/L (ref 135–145)
Total Bilirubin: 0.3 mg/dL (ref 0.3–1.2)
Total Protein: 7.9 g/dL (ref 6.0–8.3)

## 2012-02-01 LAB — CBC WITH DIFFERENTIAL/PLATELET
Eosinophils Absolute: 0.1 10*3/uL (ref 0.0–0.7)
Hemoglobin: 11.3 g/dL — ABNORMAL LOW (ref 12.0–15.0)
Lymphocytes Relative: 25 % (ref 12–46)
Lymphs Abs: 3.5 10*3/uL (ref 0.7–4.0)
Monocytes Relative: 4 % (ref 3–12)
Neutro Abs: 9.5 10*3/uL — ABNORMAL HIGH (ref 1.7–7.7)
Neutrophils Relative %: 70 % (ref 43–77)
Platelets: ADEQUATE 10*3/uL (ref 150–400)
RBC: 4.1 MIL/uL (ref 3.87–5.11)
WBC: 13.6 10*3/uL — ABNORMAL HIGH (ref 4.0–10.5)

## 2012-02-01 MED ORDER — OXYCODONE-ACETAMINOPHEN 5-325 MG PO TABS
2.0000 | ORAL_TABLET | Freq: Once | ORAL | Status: AC
  Administered 2012-02-02: 2 via ORAL
  Filled 2012-02-01: qty 2

## 2012-02-01 MED ORDER — BACITRACIN ZINC 500 UNIT/GM EX OINT
TOPICAL_OINTMENT | Freq: Two times a day (BID) | CUTANEOUS | Status: DC
  Administered 2012-02-02: 2 via TOPICAL
  Filled 2012-02-01: qty 15

## 2012-02-01 MED ORDER — BACITRACIN ZINC 500 UNIT/GM EX OINT: TOPICAL_OINTMENT | Freq: Two times a day (BID) | CUTANEOUS | Status: AC

## 2012-02-01 NOTE — ED Notes (Addendum)
PT reports the incision on her Lt leg started bleeding at approx 1600 today. Pt had a BKA 2 weeks ago stitches are still in place.PT fell getting in a friends car today.

## 2012-02-01 NOTE — ED Provider Notes (Signed)
History     CSN: [id number]  Arrival date & time 02/01/12  1954   First MD Initiated Contact with Patient 02/01/12 2329      Chief Complaint  Patient presents with  . Drainage from Incision    (Consider location/radiation/quality/duration/timing/severity/associated sxs/prior treatment) HPI this patient is a 56 year old diabetic woman who is approximately 2 and half weeks status post left below-the-knee amputation by Dr. Victorino Dike. This morning, she fell and sustained blunt trauma to the stump. She said that she forgot that the lower leg had been amputated and tried to ambulate as normal. It caused her fall. She denies trauma to any other region. She is having aching, 6/10, and nonradiating pain at the stump. She has had a slight amount of serosanguineous drainage. No fever. No systemic symptoms. Pain is exacerbated by touch.  Past Medical History  Diagnosis Date  . Coronary atherosclerosis of native coronary artery     non obstructive by cath in 2008 and 2010  . Unspecified essential hypertension   . Other and unspecified hyperlipidemia   . Syncope and collapse     near-syncopal episode in November/2008  . Unspecified cardiovascular disease     Decreased left-ventricular fxn  . 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 Unspecified hereditary and idiopathic peripheral neuropathy   . Obesity, unspecified   . Esophageal reflux   . History of diabetes mellitus, type II   . Diaphragmatic hernia without mention of obstruction or gangrene   . Irritable bowel syndrome   . Morbid obesity   . Diabetes mellitus   . Cellulitis     left foot  . Anginal pain   . Stroke     2013-'no residual'    Past Surgical History  Procedure Date  . Invasive electrophysiologic study 5/09    followed by insertion of an implantable loop recorder. S/p removal   . Colonoscopy   . Cholecystectomy   . Multiple toe surgeries   . Cardiac catheterization     LAD 30%,  circumflex 50%, OM 75%, RI 60% with small branch 80%, dominant RCA 60%, EF 45-50%  . I&d extremity 12/23/2011    Procedure: IRRIGATION AND DEBRIDEMENT EXTREMITY;  Surgeon: Verlee Rossetti, MD;  Location: [facility] OR;  Service: Orthopedics;  Laterality: Left;  Left Foot  . I&d extremity 12/26/2011    Procedure: IRRIGATION AND DEBRIDEMENT EXTREMITY;  Surgeon: Toni Arthurs, MD;  Location: [facility] OR;  Service: Orthopedics;  Laterality: Left;  LEFT FOOT I&D WITH POSSIBLE WOUND VAC APPLICATION, POSSIBLE LEFT FIRST RAY AMPUTATION  . Amputation 12/30/2011    Procedure: AMPUTATION RAY;  Surgeon: Toni Arthurs, MD;  Location: [facility] OR;  Service: Orthopedics;  Laterality: Left;  LEFT FIRST RAY AMPUTATION  . Eye surgery     cataract  . Amputation 01/13/2012    Procedure: AMPUTATION BELOW KNEE;  Surgeon: Toni Arthurs, MD;  Location: [facility] OR;  Service: Orthopedics;  Laterality: Left;    Family History  Problem Relation Age of Onset  . Pneumonia Mother   . Gallbladder disease Mother     cancer  . Heart failure Mother   . Diabetes Father   . Coronary artery disease Father   . Stroke Neg Hx     History  Substance Use Topics  . Smoking status: Current Every Day Smoker -- 0.2 packs/day for 40 years    Types: Cigarettes  . Smokeless tobacco: Never Used  . Alcohol Use: 2.4 oz/week    4  Cans of beer per week     occ- foot ball season- 4 beers a week during football season.    OB History    Grav Para Term Preterm Abortions TAB SAB Ect Mult Living                  Review of Systems Gen: no weight loss, fevers, chills, night sweats Eyes: no discharge or drainage, no occular pain or visual changes Nose: no epistaxis or rhinorrhea Mouth: no dental pain, no sore throat Neck: no neck pain Lungs: no SOB, cough, wheezing CV: no chest pain, palpitations, dependent edema or orthopnea Abd: no abdominal pain, nausea, vomiting GU: no dysuria or gross hematuria MSK: no myalgias or arthralgias Neuro: no headache, no focal  neurologic deficits, having phantom limb sensations.  Skin: no rash Psyche: negative.  Allergies  Banana; Penicillins; and Strawberry  Home Medications   Current Outpatient Rx  Name Route Sig Dispense Refill  . CIPROFLOXACIN HCL 250 MG PO TABS Oral Take 250 mg by mouth 2 (two) times daily. 10 day supply started 01/27/2012    . CLOPIDOGREL BISULFATE 75 MG PO TABS Oral Take 1 tablet (75 mg total) by mouth daily with breakfast. 30 tablet 3  . GLIMEPIRIDE 4 MG PO TABS Oral Take 4 mg by mouth daily.     . INSULIN ASPART 100 UNIT/ML [place] SOLN Subcutaneous Inject 6 Units into the skin 3 (three) times daily before meals.    . INSULIN GLARGINE 100 UNIT/ML [place] SOLN Subcutaneous Inject 20 Units into the skin at bedtime.    Marland Kitchen POLYSACCHARIDE IRON COMPLEX 150 MG PO CAPS Oral Take 1 capsule (150 mg total) by mouth daily. 30 capsule 1  . LISINOPRIL 20 MG PO TABS Oral Take 20 mg by mouth daily.    Marland Kitchen METFORMIN HCL 500 MG PO TABS Oral Take 0.5 tablets (250 mg total) by mouth 2 (two) times daily with a meal. 60 tablet 1  . METHOCARBAMOL 500 MG PO TABS Oral Take 500 mg by mouth 2 (two) times daily as needed. For muscle spasms    . SENNOSIDES 8.6 MG PO TABS Oral Take 2 tablets by mouth 2 (two) times daily as needed. For constipation    . SIMVASTATIN 40 MG PO TABS Oral Take 40 mg by mouth every evening.    Marland Kitchen BACITRACIN ZINC 500 UNIT/GM EX OINT Topical Apply topically 2 (two) times daily. 120 g 0    BP 110/56  Pulse 95  Temp 97.6 F (36.4 C) (Oral)  Resp 20  SpO2 100%  Physical Exam Gen: well developed, alert Head: NCAT Eyes: PERL, EOMI Nose: no epistaixis or rhinorrhea Mouth/throat: mucosa is moist and pink. No intra oral trauma.  Neck: no c spine ttp Lungs: CTA B, no wheezing, rhonchi or rales Abd: soft, notender, nondistended, obese Back: no midline ttp, mild kyphosis Skin: see ext, no rash Neuro: CN ii-xii grossly intact, no focal deficits Psyche; normal affect,  calm and cooperative.  MSK: s/p  left BKA with incision intact, superificial abrasion noted to region of wound, tiny amount of serosanguinous drainage, wound & stump and diffusely tender. Knees, thighs, hips nontender as are bilateral UE.   ED Course  Procedures  Abrasion and contusion to left knee without signs of infection. We will irrigate and apply bacitracin. Will xray to rule out fx. Patient has script for MS to take for pain as needed - prescribed by another physician. She will call Dr. Victorino Dike to arrange for f/u and  wound check this week.   Labs Reviewed  CBC WITH DIFFERENTIAL - Abnormal; Notable for the following:    WBC 13.6 (*)  WHITE COUNT CONFIRMED ON SMEAR   Hemoglobin 11.3 (*)     HCT 34.5 (*)     Neutro Abs 9.5 (*)     All other components within normal limits  COMPREHENSIVE METABOLIC PANEL - Abnormal; Notable for the following:    Glucose, Bld 186 (*)     Albumin 3.3 (*)     Alkaline Phosphatase 135 (*)     GFR calc non Af Amer 62 (*)     GFR calc Af Amer 72 (*)     All other components within normal limits   No results found.   1. Abrasion of lower leg   2. Contusion of lower limb   3. Diabetes mellitus   4. Obesity       MDM  See ED course.         Brandt Loosen, MD 02/01/12 [phone]

## 2012-02-02 ENCOUNTER — Emergency Department (HOSPITAL_COMMUNITY): Payer: Managed Care, Other (non HMO)

## 2012-02-04 LAB — AFB CULTURE WITH SMEAR (NOT AT ARMC)

## 2012-02-09 ENCOUNTER — Emergency Department (HOSPITAL_COMMUNITY)
Admission: EM | Admit: 2012-02-09 | Discharge: 2012-02-09 | Disposition: A | Discharge status: Home / Self Care | Payer: Managed Care, Other (non HMO) | Attending: Emergency Medicine | Admitting: Emergency Medicine

## 2012-02-09 ENCOUNTER — Emergency Department (HOSPITAL_COMMUNITY): Payer: Managed Care, Other (non HMO)

## 2012-02-09 ENCOUNTER — Encounter (HOSPITAL_COMMUNITY): Payer: Self-pay | Admitting: Emergency Medicine

## 2012-02-09 DIAGNOSIS — E785 Hyperlipidemia, unspecified: Secondary | ICD-10-CM | POA: Insufficient documentation

## 2012-02-09 DIAGNOSIS — E119 Type 2 diabetes mellitus without complications: Secondary | ICD-10-CM | POA: Insufficient documentation

## 2012-02-09 DIAGNOSIS — S88119A Complete traumatic amputation at level between knee and ankle, unspecified lower leg, initial encounter: Secondary | ICD-10-CM | POA: Insufficient documentation

## 2012-02-09 DIAGNOSIS — Z794 Long term (current) use of insulin: Secondary | ICD-10-CM | POA: Insufficient documentation

## 2012-02-09 DIAGNOSIS — Z8673 Personal history of transient ischemic attack (TIA), and cerebral infarction without residual deficits: Secondary | ICD-10-CM | POA: Insufficient documentation

## 2012-02-09 DIAGNOSIS — R079 Chest pain, unspecified: Secondary | ICD-10-CM

## 2012-02-09 DIAGNOSIS — F17200 Nicotine dependence, unspecified, uncomplicated: Secondary | ICD-10-CM | POA: Insufficient documentation

## 2012-02-09 DIAGNOSIS — I2510 Atherosclerotic heart disease of native coronary artery without angina pectoris: Secondary | ICD-10-CM | POA: Insufficient documentation

## 2012-02-09 DIAGNOSIS — K219 Gastro-esophageal reflux disease without esophagitis: Secondary | ICD-10-CM | POA: Insufficient documentation

## 2012-02-09 DIAGNOSIS — I10 Essential (primary) hypertension: Secondary | ICD-10-CM | POA: Insufficient documentation

## 2012-02-09 DIAGNOSIS — K589 Irritable bowel syndrome without diarrhea: Secondary | ICD-10-CM | POA: Insufficient documentation

## 2012-02-09 LAB — BASIC METABOLIC PANEL
BUN: 20 mg/dL (ref 6–23)
Creatinine, Ser: 0.96 mg/dL (ref 0.50–1.10)
GFR calc Af Amer: 75 mL/min — ABNORMAL LOW (ref >90)
GFR calc non Af Amer: 64 mL/min — ABNORMAL LOW (ref >90)

## 2012-02-09 LAB — CBC WITH DIFFERENTIAL/PLATELET
Basophils Absolute: 0 10*3/uL (ref 0.0–0.1)
Eosinophils Absolute: 0.1 10*3/uL (ref 0.0–0.7)
HCT: 36.4 % (ref 36.0–46.0)
Lymphocytes Relative: 28 % (ref 12–46)
Lymphs Abs: 2.7 10*3/uL (ref 0.7–4.0)
MCHC: 31.9 g/dL (ref 30.0–36.0)
MCV: 84.5 fL (ref 78.0–100.0)
Neutro Abs: 6.6 10*3/uL (ref 1.7–7.7)
Platelets: ADEQUATE 10*3/uL (ref 150–400)
RDW: 15 % (ref 11.5–15.5)
Smear Review: ADEQUATE

## 2012-02-09 MED ORDER — NITROGLYCERIN 0.4 MG SL SUBL
0.4000 mg | SUBLINGUAL_TABLET | SUBLINGUAL | Status: DC | PRN
  Administered 2012-02-09: 0.4 mg via SUBLINGUAL
  Filled 2012-02-09: qty 25

## 2012-02-09 MED ORDER — HYDROCODONE-ACETAMINOPHEN 10-325 MG PO TABS
2.0000 | ORAL_TABLET | Freq: Once | ORAL | Status: AC
  Administered 2012-02-09: 2 via ORAL
  Filled 2012-02-09: qty 2

## 2012-02-09 NOTE — ED Notes (Signed)
Pt c/o CP to left chest that describes as "squeezing". Pt reports having this type of CP in the past. 3 weeks post-op BKA.

## 2012-02-09 NOTE — ED Provider Notes (Signed)
History     CSN: [id number]  Arrival date & time 02/09/12  9147   First MD Initiated Contact with Patient 02/09/12 [phone]      Chief Complaint  Patient presents with  . Chest Pain    Patient is a 56 y.o. female presenting with chest pain. The history is provided by the patient.  Chest Pain Episode onset: several hours ago. Chest pain occurs constantly. The chest pain is improving. Associated with: rest. The severity of the pain is moderate. The quality of the pain is described as aching and pressure-like. The pain does not radiate. Pertinent negatives for primary symptoms include no fever, no shortness of breath and no cough.  Pertinent negatives for associated symptoms include no weakness. She tried nitroglycerin and aspirin for the symptoms.   pt reports she woke up with chest pressure/pain that is now improved.  She received NTG/ASA prior to arrival by EMS.  She reports some improvement in pain from NTG  She reports recent left BKA but no fever/chills or excessive pain in the leg.  Past Medical History  Diagnosis Date  . Coronary atherosclerosis of native coronary artery     non obstructive by cath in 2008 and 2010  . Unspecified essential hypertension   . Other and unspecified hyperlipidemia   . Syncope and collapse     near-syncopal episode in November/2008  . Unspecified cardiovascular disease     Decreased left-ventricular fxn  . 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 Unspecified hereditary and idiopathic peripheral neuropathy   . Obesity, unspecified   . Esophageal reflux   . History of diabetes mellitus, type II   . Diaphragmatic hernia without mention of obstruction or gangrene   . Irritable bowel syndrome   . Morbid obesity   . Diabetes mellitus   . Cellulitis     left foot  . Anginal pain   . Stroke     2013-'no residual'    Past Surgical History  Procedure Date  . Invasive electrophysiologic study 5/09   followed by insertion of an implantable loop recorder. S/p removal   . Colonoscopy   . Cholecystectomy   . Multiple toe surgeries   . Cardiac catheterization     LAD 30%, circumflex 50%, OM 75%, RI 60% with small branch 80%, dominant RCA 60%, EF 45-50%  . I&d extremity 12/23/2011    Procedure: IRRIGATION AND DEBRIDEMENT EXTREMITY;  Surgeon: Verlee Rossetti, MD;  Location: [facility] OR;  Service: Orthopedics;  Laterality: Left;  Left Foot  . I&d extremity 12/26/2011    Procedure: IRRIGATION AND DEBRIDEMENT EXTREMITY;  Surgeon: Toni Arthurs, MD;  Location: [facility] OR;  Service: Orthopedics;  Laterality: Left;  LEFT FOOT I&D WITH POSSIBLE WOUND VAC APPLICATION, POSSIBLE LEFT FIRST RAY AMPUTATION  . Amputation 12/30/2011    Procedure: AMPUTATION RAY;  Surgeon: Toni Arthurs, MD;  Location: [facility] OR;  Service: Orthopedics;  Laterality: Left;  LEFT FIRST RAY AMPUTATION  . Eye surgery     cataract  . Amputation 01/13/2012    Procedure: AMPUTATION BELOW KNEE;  Surgeon: Toni Arthurs, MD;  Location: [facility] OR;  Service: Orthopedics;  Laterality: Left;    Family History  Problem Relation Age of Onset  . Pneumonia Mother   . Gallbladder disease Mother     cancer  . Heart failure Mother   . Diabetes Father   . Coronary artery disease Father   . Stroke Neg Hx     History  Substance Use Topics  . Smoking status: Current Every Day Smoker -- 0.2 packs/day for 40 years    Types: Cigarettes  . Smokeless tobacco: Never Used  . Alcohol Use: 2.4 oz/week    4 Cans of beer per week     occ- foot ball season- 4 beers a week during football season.    OB History    Grav Para Term Preterm Abortions TAB SAB Ect Mult Living                  Review of Systems  Constitutional: Negative for fever.  Respiratory: Negative for cough and shortness of breath.   Cardiovascular: Positive for chest pain.  Neurological: Negative for weakness.  All other systems reviewed and are negative.    Allergies  Banana; Penicillins; and  Strawberry  Home Medications   Current Outpatient Rx  Name Route Sig Dispense Refill  . BACITRACIN ZINC 500 UNIT/GM EX OINT Topical Apply topically 2 (two) times daily. 120 g 0  . CLOPIDOGREL BISULFATE 75 MG PO TABS Oral Take 1 tablet (75 mg total) by mouth daily with breakfast. 30 tablet 3  . GLIMEPIRIDE 4 MG PO TABS Oral Take 4 mg by mouth daily.     . INSULIN ASPART 100 UNIT/ML [place] SOLN Subcutaneous Inject 6 Units into the skin 3 (three) times daily before meals.    . INSULIN GLARGINE 100 UNIT/ML [place] SOLN Subcutaneous Inject 20 Units into the skin at bedtime.    Marland Kitchen POLYSACCHARIDE IRON COMPLEX 150 MG PO CAPS Oral Take 1 capsule (150 mg total) by mouth daily. 30 capsule 1  . LISINOPRIL 20 MG PO TABS Oral Take 20 mg by mouth daily.    Marland Kitchen METFORMIN HCL 500 MG PO TABS Oral Take 0.5 tablets (250 mg total) by mouth 2 (two) times daily with a meal. 60 tablet 1  . METHOCARBAMOL 500 MG PO TABS Oral Take 500 mg by mouth 2 (two) times daily as needed. For muscle spasms    . SENNOSIDES 8.6 MG PO TABS Oral Take 2 tablets by mouth 2 (two) times daily as needed. For constipation    . SIMVASTATIN 40 MG PO TABS Oral Take 40 mg by mouth every evening.      BP 116/56  Pulse 92  Temp 97.4 F (36.3 C) (Oral)  Resp 15  Wt 220 lb (99.791 kg)  SpO2 100%  Physical Exam CONSTITUTIONAL: Well developed/well nourished HEAD AND FACE: Normocephalic/atraumatic EYES: EOMI/PERRL ENMT: Mucous membranes moist NECK: supple no meningeal signs SPINE:entire spine nontender CV: S1/S2 noted, no murmurs/rubs/gallops noted LUNGS: Lungs are clear to auscultation bilaterally, no apparent distress ABDOMEN: soft, nontender, no rebound or guarding GU:no cva tenderness NEURO: Pt is awake/alert, moves all extremitiesx4 EXTREMITIES: pulses normal, full ROM.  Left BKA stump noted, no erythema or excessive drainage noted SKIN: warm, color normal PSYCH: no abnormalities of mood noted  ED Course  Procedures   Labs Reviewed    CBC WITH DIFFERENTIAL  BASIC METABOLIC PANEL  TROPONIN I  TROPONIN I  TROPONIN I   9:42 AM Pt with CP prior to arrival that is now improving She has already received ASA She had recent surgery, however I doubt PE given history/exam Cardiac workup pending Place in CDU pending workup and will need cardiology evaluation D/w [facility] to f/u on labs and call cardiology  MDM  Nursing notes including past medical history and social history reviewed and considered in documentation xrays reviewed and considered Previous records reviewed and considered  Date: 02/09/2012  Rate: 93  Rhythm: normal sinus rhythm  QRS Axis: normal  Intervals: normal  ST/T Wave abnormalities: normal  Conduction Disutrbances:none  Narrative Interpretation:   Old EKG Reviewed: unchanged    Joya Gaskins, MD 02/09/12 [phone]

## 2012-02-09 NOTE — ED Provider Notes (Signed)
[name] with a hx sig for diabetes, stroke, and CAD (nonobstructive cath in 2008 and 2010) was placed in CDU on pending 4 p.m. Troponin. [name] care resumed from Dr. Bebe Shaggy and [facility]. While in observation pt has had no complaints, per nursing staff. [name] re-evaluated and is resting comfortable, VSS, with no new complaints or concerns at this time. Plan per previous provider is to discharge home if results are negative for cardiology followup as an outpatient versus reconsultation [facility] if positive.. On exam: hemodynamically stable, NAD, heart w/ RRR, lungs CTAB, Chest & abd non-tender, no peripheral edema or calf tenderness.  5:06 PM  Troponin has been drawn, but results have yet to cross over. Pt is resting comfortably & is still chest pain free.    5:37 PM [name] is to be discharged with recommendation to follow up with [facility] cardiology in regards to today's hospital visit. Pts symptoms unlikely to be of CAD etiology and pt has not reported any CP while in my care in the CDU. Labs and imaging reviewed again prior to dc. Based on plan per pts cardiologist they will be dc since serial troponin's have returned wnl. Pt has been advised return to the ED if develop any exertional CP- strict return precautions discussed & [name]'s questions answered. Pt appears reliable for follow up and is agreeable to discharge.    [name], [place] 02/09/12 1742

## 2012-02-09 NOTE — ED Provider Notes (Signed)
Medical screening examination/treatment/procedure(s) were performed by non-physician practitioner and as supervising physician I was immediately available for consultation/collaboration.   Gwyneth Sprout, MD 02/09/12 2326

## 2012-02-09 NOTE — ED Notes (Signed)
Pt. Alert and oriented X3, placed in CDU 2, waiting for [facility] to come and see.  Pt. Continues to have chest pressure, lt. Side of the chest.  Described as a heaviness.  Pt. Would like something to eat and drink.

## 2012-02-09 NOTE — ED Provider Notes (Signed)
10:35 AM Patient in CDU holding for cardiology consult.  Sign out received from Dr Bebe Shaggy.  Pt with left sided chest pressure/squeezing pain.  Has seen Dr Daleen Squibb of [facility] cardiology in the past, last cath 2010 showed diffuse disease, plan for medical management.  Plan is for  to consult, see patient in ED.  Will follow their recommendations.  Pt reports currently she has 7/10 chest pain, also having pain in left BKA stump as she has not taken her home morphine yet today.  Pt currently does not have IV, nurse Lawson Fiscal to address this.  Troponin has also not been drawn, I have called the main lab and they will add troponin to her initial blood work.  Pt is A&O, NAD, RRR, CTAB, left BKA incision is intact, no erythema, edema, warmth, drainage, nontender to palpation.    11:34 AM Spoke with [facility] cardiology cardmaster Trish.  Pt to be seen in ED.    1:27 PM Pt has been seen in CDU by [facility] cardiology.  Plan is for repeat cardiac markers, ordered by Theodore Demark, PA, if negative, pt may be d/c home with outpatient cardiology follow up.    Second troponin ordered for 4:00pm.  If negative, patient to be discharged home.  Cardiology will follow up with her as an outpatient.  3:45 PM Patient signed out to [facility], [place], who assumes care of patient at change of shift.   [place], [place] 02/09/12 1545

## 2012-02-09 NOTE — Consult Note (Signed)
CARDIOLOGY CONSULT NOTE   Patient ID: [name] MRN: [id number] DOB/AGE: 12/26/1955 56 y.o.  Admit date: 02/09/2012  Primary Physician   [name], MD Primary Cardiologist   JA Reason for Consultation   Chest pain  [name] is a 56 y.o. female with a history of non-obstructive CAD by cath 2010. She was last evaluated for chest pain in 2011, when she had a stress test that was low-risk.  [name] was otherwise in her usual state of health yesterday. She has not had chest pain since she was admitted in July of 2011. Yesterday, she watched a football game and 8 as usual without any difficulties.This a.m., at approximately 3 AM, she was awakened by left-sided chest pain, 9/10. It was a squeezing and throbbing pain. It did not radiate. It was not associated with shortness of breath, nausea, vomiting or diaphoresis. She did not take any medications for it. Her pain gradually eased off a little and she was able to go back to sleep. She woke again with it this morning and when her therapist got there about 8 AM, EMS was called. EMS gave her sublingual nitroglycerin but it did not change the pain very much. Her pain has been at least and 8/10 since it began at 3 AM today. Of note, her chest pain gets worse with deep inspiration and her chest wall is tender to palpation. She has no history of exertional chest pain. 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 She does not yet have a prosthesis but is working with rehabilitation at home and doing what she can to care for herself.   Past Medical History  Diagnosis Date  . Coronary atherosclerosis of native coronary artery     non obstructive by cath in 2008 and 2010  . Unspecified essential hypertension   . Other and unspecified hyperlipidemia   . Syncope and collapse     near-syncopal episode in November/2008  . Unspecified cardiovascular disease      Decreased left-ventricular fxn  . 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 Unspecified hereditary and idiopathic peripheral neuropathy   . Obesity, unspecified   . Esophageal reflux   . History of diabetes mellitus, type II   . Diaphragmatic hernia without mention of obstruction or gangrene   . Irritable bowel syndrome   . Morbid obesity   . Diabetes mellitus   . Cellulitis     left foot  . Anginal pain   . Stroke     2013-'no residual'     Past Surgical History  Procedure Date  . Invasive electrophysiologic study 5/09    followed by insertion of an implantable loop recorder. S/p removal   . Colonoscopy   . Cholecystectomy   . Multiple toe surgeries   . Cardiac catheterization CORONARIES: Left main was normal. The LAD had proximal 25% stenosis.  There was mid long 30% stenosis, distal 30% stenosis. There were very  small diagonals with diffuse predominantly nonobstructive disease, these  were not well visualized. However, they were tiny, not clinically  significant vessels. They could contribute to chest discomfort but not  large area of ischemia. Circumflex in the AV groove had mid 50%  stenosis after mid obtuse marginal. Mid obtuse marginal was long but  narrow in caliber. There was long 75% diffuse stenosis (this was  unchanged from previous). Ramus intermediate  was large and long. There  was proximal 50% to 60% stenosis. The superior branch had focal 80%  stenosis. It was a narrow caliber vessel. The inferior branch was free  of high-grade stenosis. The right coronary artery is a large dominant  vessel. There was long proximal 50-60% stenosis. There was diffuse  nonobstructive mid disease. PDA was moderate sized and normal.  VENTRICULOGRAM: The left ventricle was somewhat underfilled during the  ventriculogram. This was taken in the RAO view. EF was 45-50% with  mild apical/anteroapical/inferoapical hyperkinesis.  CONCLUSION:  A 3-vessel coronary artery disease which is predominantly  small vessel with diffuse disease. I reviewed carefully the current  catheterization side-by-side to the previous catheterization and found  there to be no significant change with perhaps slight progression. I  would again suggest medical management of this disease. 11/03/2008    LAD 30%, circumflex 50%, OM 75%, RI 60% with small branch 80%, dominant RCA 60%, EF 45-50%  . I&d extremity 12/23/2011    Procedure: IRRIGATION AND DEBRIDEMENT EXTREMITY;  Surgeon: Verlee Rossetti, MD;  Location: [facility] OR;  Service: Orthopedics;  Laterality: Left;  Left Foot  . I&d extremity 12/26/2011    Procedure: IRRIGATION AND DEBRIDEMENT EXTREMITY;  Surgeon: Toni Arthurs, MD;  Location: [facility] OR;  Service: Orthopedics;  Laterality: Left;  LEFT FOOT I&D WITH POSSIBLE WOUND VAC APPLICATION, POSSIBLE LEFT FIRST RAY AMPUTATION  . Amputation 12/30/2011    Procedure: AMPUTATION RAY;  Surgeon: Toni Arthurs, MD;  Location: [facility] OR;  Service: Orthopedics;  Laterality: Left;  LEFT FIRST RAY AMPUTATION  . Eye surgery     cataract  . Amputation 01/13/2012    Procedure: AMPUTATION BELOW KNEE;  Surgeon: Toni Arthurs, MD;  Location: [facility] OR;  Service: Orthopedics;  Laterality: Left;    Allergies  Allergen Reactions  . Banana Other (See Comments)    Facial swelling  . Penicillins Itching     edema  . Strawberry Other (See Comments)    Facial swelling    I have reviewed the patient's current medications     nitroGLYCERIN  Medication Sig  bacitracin ointment Apply topically 2 (two) times daily.  clopidogrel (PLAVIX) 75 MG tablet Take 1 tablet (75 mg total) by mouth daily with breakfast.  glimepiride (AMARYL) 4 MG tablet Take 4 mg by mouth daily.   insulin aspart (NOVOLOG) 100 UNIT/ML injection Inject 6 Units into the skin 3 (three) times daily before meals.  insulin glargine (LANTUS) 100 UNIT/ML injection Inject 20 Units into the skin at bedtime.  iron polysaccharides  150 MG  capsule Take 1 capsule (150 mg total) by mouth daily.  lisinopril (PRINIVIL ) 20 MG tablet Take 20 mg by mouth daily.  metFORMIN   500 MG tablet Take 0.5 tablets (250 mg total) PO 2 times daily with a meal.  methocarbamol (ROBAXIN) 500 MG tablet Take 500 mg by mouth 2 (two) times daily as needed. For muscle spasms  morphine (MSIR) 15 MG tablet Take 15 mg by mouth every 4 (four) hours as needed. For pain  senna (SENOKOT) 8.6 MG tablet Take 2 tablets by mouth 2 times daily PRN for constipation  simvastatin (ZOCOR) 40 MG tablet Take 40 mg by mouth every evening.    History   Social History  . Marital Status: Single    Spouse Name: N/A    Number of Children: N/A  . Years of Education: N/A   Occupational History  . Document [employer] company   Social  History Main Topics  . Smoking status: Current Every Day Smoker -- 0.2 packs/day for 40 years    Types: Cigarettes  . Smokeless tobacco: Never Used  . Alcohol Use: 2.4 oz/week    4 Cans of beer per week     occ- foot ball season- 4 beers a week during football season.  . Drug Use: No  . Sexually Active: Not Currently    Birth Control/ Protection: Post-menopausal   Other Topics Concern  . Not on file   Social History Narrative   She drinks beer. Several on sundays. Does not routinely exercise. 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    Family Status  Relation Status Death Age  . Mother Deceased 69    end-stage renal disease - no CAD  . Father Alive     70 's, Hx CAD, not premature.  . Brother Alive   . Sister Alive     x 2. One sister has a colostomy   Family History  Problem Relation Age of Onset  . Pneumonia Mother   . Gallbladder disease Mother     cancer  . Heart failure Mother   . Diabetes Father   . Coronary artery disease Father   . Stroke Neg Hx      ROS: She is still smoking. She has chronic pain issues with her stump. She denies recent GI symptoms or any melena. She has  not had fevers or chills. She feels she is still weak but has improved with rehabilitation.she does not know what her hemoglobin A1c normally runs. Full 14 point review of systems complete and found to be negative unless listed above.  Physical Exam: Blood pressure 109/107, pulse 87, temperature 97.6 F (36.4 C), temperature source Oral, resp. rate 14, weight 220 lb (99.791 kg), SpO2 100.00%.  General: Well developed, well nourished, female in no acute distress Head: Eyes PERRLA, No xanthomas.   Normocephalic and atraumatic, oropharynx without edema or exudate. Dentition: poor Lungs: Few bilateral basilar rales Heart: HRRR S1 S2, no rub/gallop, no significant murmur. pulses are 2+ both upper extrem. Right DP is 1+, bilateral femoral pulses are present and no femoral bruits are appreciated. Neck: No carotid bruits. No lymphadenopathy.  JVD not elevated Abdomen: Bowel sounds present, abdomen soft and non-tender without masses or hernias noted. Msk:  No spine or cva tenderness. No weakness, no joint deformities or effusions. S/p left BKA Extremities: No clubbing or cyanosis. No edema.  Neuro: Alert and oriented X 3. No focal deficits noted. Psych:  Good affect, responds appropriately Skin: No rashes or lesions noted.  Labs:   Lab Results  Component Value Date   WBC 9.8 02/09/2012   HGB 11.6* 02/09/2012   HCT 36.4 02/09/2012   MCV 84.5 02/09/2012   PLT PLATELET CLUMPS NOTED ON SMEAR, COUNT APPEARS ADEQUATE 02/09/2012    Lab 02/09/12 0915  NA 142  K 3.6  CL 103  CO2 24  BUN 20  CREATININE 0.96  CALCIUM 9.8  PROT --  BILITOT --  ALKPHOS --  ALT --  AST --  GLUCOSE 159*    Basename 02/09/12 0917  CKTOTAL --  CKMB --  TROPONINI <0.30    Echo: 01/08/2012 Study Conclusions Left ventricle: The cavity size was normal. Wall thickness was increased in a pattern of moderate LVH. Systolic function was normal. The estimated ejection fraction was in the range of 50% to 55%. Wall motion  was normal; there were no regional wall motion abnormalities.  Doppler parameters are consistent with abnormal left ventricular relaxation (grade 1 diastolic dysfunction).   ECG: 09-Feb-2012 08:57:01   SINUS RHYTHM ~ normal P axis, V-rate 50- 99 Standard 12 Lead Report ~ Unconfirmed Interpretation Normal ECG Vent. rate 93 BPM PR interval 180 ms QRS duration 78 ms QT/QTc 372/463 ms P-R-T axes 53 32 70   Resting Myoview - pain study - 12/05/2010 Joli Mccuiston is a 56 year old with a history of moderate coronary artery disease. She presented to emerge room today with a prolonged episode of chest pain. She was seen by Dr. Berton Mount. We injected a resting dose of Myoview during chest pain to see if we could identify any significant areas of attenuation.  The Myoview images injected during chest pain are very inhomogeneous. There is attenuation of the inferior wall but this could be due to diaphragmatic attenuation. Uptake in the anterior wall, apex, lateral wall is fairly normal but the uptake is not homogeneous.  The cine loop images revealed fairly normal contractility in all areas of the myocardium including the inferior wall. The ejection fraction is recorded at 44% but it is clear that the contours identified by the computer algorithm do not follow the edges of the myocardium and I am certain that the program is underestimating the left ventricular systolic function. The left ventricular systolic function appears to be grossly normal .  This is interpreted as a nondiagnostic resting Myoview study. There is attenuation of the inferior wall which certainly may be due to diaphragmatic attenuation. The inferior wall contracts fairly normally. I cannot exclude ischemia on this study. The left ventricular systolic function appears to be normal.  Radiology:  Dg Knee 1-2 Views Left 02/02/2012  *RADIOLOGY REPORT*  Clinical Data: Status post amputation with pain and drainage from  incision site.  LEFT KNEE - 1-2 VIEW  Comparison: None.  Findings: The patient is status post BKA.  No soft tissue gas collection or bony destructive change is identified. Atherosclerotic vascular disease is noted.  There is no fracture.  IMPRESSION: Status post BKA.  No acute finding or focal abnormality.   Original Report Authenticated By: Bernadene Bell. Maricela Curet, M.D.    Dg Chest Port 1 View 02/09/2012  *RADIOLOGY REPORT*  Clinical Data: Chest pain.  PORTABLE CHEST - 1 VIEW  Comparison: 01/07/2012  Findings: Studies AP lordotic in positioning. Heart and mediastinal contours are within normal limits.  No focal opacities or effusions.  No acute bony abnormality.  IMPRESSION: No active cardiopulmonary disease.   Original Report Authenticated By: Cyndie Chime, M.D.     ASSESSMENT AND PLAN:   The patient was seen today by Dr Eden Emms, the patient evaluated and the data reviewed.  Active Problems:  Chest pain at rest - Ms. Bokhari has had pain continuously for greater than 8 hours without ECG changes. Her pain is atypical and she has not had exertional chest pain despite the stresses of surgery and rehab. She has a history of non-obstructive disease by cath, last in 2010. We will go ahead and recheck a CK-MB as well as troponin. If these are negative, we will leave management of her pain to ER staff and/or Internal Medicine. We will set up an out-patient stress test, but this can be performed as an -- patient if she is admitted.    Signed: Theodore Demark 02/09/2012, 12:33 PM Co-Sign MD  Patient examined and chart reviewed. Atypical pain with normal ECG and negative enzymes.  Myovue 7/12 normal.  Ok to D/C from cardiac  perspective Will arrange outpatient myovue.  LLE stump seems to be healing well  Charlton Haws

## 2012-02-11 ENCOUNTER — Encounter (HOSPITAL_COMMUNITY): Payer: Managed Care, Other (non HMO)

## 2012-02-11 ENCOUNTER — Emergency Department (HOSPITAL_COMMUNITY)
Admission: EM | Admit: 2012-02-11 | Discharge: 2012-02-11 | Disposition: A | Discharge status: Home / Self Care | Payer: Managed Care, Other (non HMO) | Attending: Emergency Medicine | Admitting: Emergency Medicine

## 2012-02-11 ENCOUNTER — Encounter (HOSPITAL_COMMUNITY): Payer: Self-pay | Admitting: Family Medicine

## 2012-02-11 DIAGNOSIS — E669 Obesity, unspecified: Secondary | ICD-10-CM | POA: Insufficient documentation

## 2012-02-11 DIAGNOSIS — R42 Dizziness and giddiness: Secondary | ICD-10-CM

## 2012-02-11 DIAGNOSIS — Z8673 Personal history of transient ischemic attack (TIA), and cerebral infarction without residual deficits: Secondary | ICD-10-CM | POA: Insufficient documentation

## 2012-02-11 DIAGNOSIS — Z9089 Acquired absence of other organs: Secondary | ICD-10-CM | POA: Insufficient documentation

## 2012-02-11 DIAGNOSIS — S88119A Complete traumatic amputation at level between knee and ankle, unspecified lower leg, initial encounter: Secondary | ICD-10-CM | POA: Insufficient documentation

## 2012-02-11 DIAGNOSIS — E119 Type 2 diabetes mellitus without complications: Secondary | ICD-10-CM | POA: Insufficient documentation

## 2012-02-11 LAB — CBC WITH DIFFERENTIAL/PLATELET
Basophils Absolute: 0 10*3/uL (ref 0.0–0.1)
Basophils Relative: 0 % (ref 0–1)
Eosinophils Absolute: 0.1 10*3/uL (ref 0.0–0.7)
Eosinophils Relative: 1 % (ref 0–5)
HCT: 34.3 % — ABNORMAL LOW (ref 36.0–46.0)
Hemoglobin: 11.1 g/dL — ABNORMAL LOW (ref 12.0–15.0)
MCH: 27.1 pg (ref 26.0–34.0)
MCHC: 32.4 g/dL (ref 30.0–36.0)
MCV: 83.9 fL (ref 78.0–100.0)
Monocytes Absolute: 0.4 10*3/uL (ref 0.1–1.0)
Monocytes Relative: 4 % (ref 3–12)
Neutro Abs: 6.4 10*3/uL (ref 1.7–7.7)
RDW: 14.8 % (ref 11.5–15.5)

## 2012-02-11 LAB — URINALYSIS, ROUTINE W REFLEX MICROSCOPIC
Bilirubin Urine: NEGATIVE
Ketones, ur: NEGATIVE mg/dL
Nitrite: NEGATIVE
Protein, ur: NEGATIVE mg/dL
pH: 5.5 (ref 5.0–8.0)

## 2012-02-11 LAB — URINE MICROSCOPIC-ADD ON

## 2012-02-11 LAB — BASIC METABOLIC PANEL
BUN: 22 mg/dL (ref 6–23)
Calcium: 9.7 mg/dL (ref 8.4–10.5)
Chloride: 102 mEq/L (ref 96–112)
Creatinine, Ser: 0.87 mg/dL (ref 0.50–1.10)
GFR calc Af Amer: 84 mL/min — ABNORMAL LOW (ref >90)

## 2012-02-11 MED ORDER — MECLIZINE HCL 25 MG PO TABS
50.0000 mg | ORAL_TABLET | Freq: Once | ORAL | Status: AC
  Administered 2012-02-11: 50 mg via ORAL
  Filled 2012-02-11: qty 2

## 2012-02-11 MED ORDER — MECLIZINE HCL 25 MG PO TABS: 25.0000 mg | ORAL_TABLET | Freq: Three times a day (TID) | ORAL | Status: DC | PRN

## 2012-02-11 NOTE — ED Provider Notes (Signed)
Medical screening examination/treatment/procedure(s) were performed by non-physician practitioner and as supervising physician I was immediately available for consultation/collaboration.   Lyanne Co, MD 02/11/12 [phone]

## 2012-02-11 NOTE — ED Provider Notes (Signed)
Re-eval:  She reports minimal improvement with meclizine, but appears comfortable. Able to watch/focus on TV while talking during re-evaluation. She reports she lives alone but is comfortable with discharge back there. Labs reviewed with Dr. Patria Mane.   Rodena Medin, PA-C 02/11/12 1348

## 2012-02-11 NOTE — ED Notes (Signed)
Patient is unable to void at this time 

## 2012-02-11 NOTE — ED Notes (Signed)
Patient unable to void at this time

## 2012-02-11 NOTE — ED Provider Notes (Signed)
Medical screening examination/treatment/procedure(s) were conducted as a shared visit with non-physician practitioner(s) and myself.  I personally evaluated the patient during the encounter    Joya Gaskins, MD 02/11/12 [phone]

## 2012-02-11 NOTE — ED Provider Notes (Signed)
History     CSN: [id number]  Arrival date & time 02/11/12  4098   First MD Initiated Contact with Patient 02/11/12 1000      Chief Complaint  Patient presents with  . Dizziness    (Consider location/radiation/quality/duration/timing/severity/associated sxs/prior treatment) HPI Comments: Patient reports she woke up this morning with dizziness, described as "the room spinning around."  Symptoms are worse with moving head or looking around.  Associated mild nausea.  Pt has had recent BKA, healing well, pt had follow up with Dr Victorino Dike scheduled for this afternoon.  Has also had  Sensation of a knot or lump at the base of her throat/upper chest today.  Pt was seen recently in the ED with a cardiology consult and outpatient follow up, scheduled for next week.  Pt states she was feeling perfectly well yesterday.  Has had episodes similar to this (dizziness) in the remote past.  Pt denies focal neurological deficits, fevers, SOB, cough, abdominal pain, N/V/D, urinary symptoms.  Denies pain or discharge from her BKA surgical site.  Pt has been eating and drinking well.    The history is provided by the patient.    Past Medical History  Diagnosis Date  . Coronary atherosclerosis of native coronary artery     non obstructive by cath in 2008 and 2010  . Unspecified essential hypertension   . Other and unspecified hyperlipidemia   . Syncope and collapse     near-syncopal episode in November/2008  . Unspecified cardiovascular disease     Decreased left-ventricular fxn  . 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 Unspecified hereditary and idiopathic peripheral neuropathy   . Obesity, unspecified   . Esophageal reflux   . History of diabetes mellitus, type II   . Diaphragmatic hernia without mention of obstruction or gangrene   . Irritable bowel syndrome   . Morbid obesity   . Diabetes mellitus   . Cellulitis     left foot  . Anginal pain   . Stroke      2013-'no residual'    Past Surgical History  Procedure Date  . Invasive electrophysiologic study 5/09    followed by insertion of an implantable loop recorder. S/p removal   . Colonoscopy   . Cholecystectomy   . Multiple toe surgeries   . Cardiac catheterization     LAD 30%, circumflex 50%, OM 75%, RI 60% with small branch 80%, dominant RCA 60%, EF 45-50%  . I&d extremity 12/23/2011    Procedure: IRRIGATION AND DEBRIDEMENT EXTREMITY;  Surgeon: Verlee Rossetti, MD;  Location: [facility] OR;  Service: Orthopedics;  Laterality: Left;  Left Foot  . I&d extremity 12/26/2011    Procedure: IRRIGATION AND DEBRIDEMENT EXTREMITY;  Surgeon: Toni Arthurs, MD;  Location: [facility] OR;  Service: Orthopedics;  Laterality: Left;  LEFT FOOT I&D WITH POSSIBLE WOUND VAC APPLICATION, POSSIBLE LEFT FIRST RAY AMPUTATION  . Amputation 12/30/2011    Procedure: AMPUTATION RAY;  Surgeon: Toni Arthurs, MD;  Location: [facility] OR;  Service: Orthopedics;  Laterality: Left;  LEFT FIRST RAY AMPUTATION  . Eye surgery     cataract  . Amputation 01/13/2012    Procedure: AMPUTATION BELOW KNEE;  Surgeon: Toni Arthurs, MD;  Location: [facility] OR;  Service: Orthopedics;  Laterality: Left;    Family History  Problem Relation Age of Onset  . Pneumonia Mother   . Gallbladder disease Mother     cancer  . Heart failure Mother   .  Diabetes Father   . Coronary artery disease Father   . Stroke Neg Hx     History  Substance Use Topics  . Smoking status: Current Every Day Smoker -- 0.2 packs/day for 40 years    Types: Cigarettes  . Smokeless tobacco: Never Used  . Alcohol Use: 2.4 oz/week    4 Cans of beer per week     occ- foot ball season- 4 beers a week during football season.    OB History    Grav Para Term Preterm Abortions TAB SAB Ect Mult Living                  Review of Systems  Constitutional: Negative for fever and chills.  HENT: Negative for trouble swallowing.   Respiratory: Negative for cough and shortness of breath.     Cardiovascular: Positive for chest pain.  Gastrointestinal: Positive for nausea. Negative for vomiting, abdominal pain and diarrhea.  Genitourinary: Negative for dysuria, urgency and frequency.  Skin: Negative for color change and rash.    Allergies  Banana; Penicillins; and Strawberry  Home Medications   Current Outpatient Rx  Name Route Sig Dispense Refill  . BACITRACIN ZINC 500 UNIT/GM EX OINT Topical Apply topically 2 (two) times daily. 120 g 0  . CIPROFLOXACIN HCL 250 MG PO TABS Oral Take 250 mg by mouth 2 (two) times daily.    Marland Kitchen CLINDAMYCIN HCL 300 MG PO CAPS Oral Take 600 mg by mouth 3 (three) times daily.    Marland Kitchen CLOPIDOGREL BISULFATE 75 MG PO TABS Oral Take 1 tablet (75 mg total) by mouth daily with breakfast. 30 tablet 3  . GLIMEPIRIDE 4 MG PO TABS Oral Take 4 mg by mouth daily.     Marland Kitchen HYDROCHLOROTHIAZIDE 25 MG PO TABS Oral Take 25 mg by mouth daily.    . INSULIN ASPART 100 UNIT/ML [place] SOLN Subcutaneous Inject 6 Units into the skin 3 (three) times daily before meals.     . INSULIN GLARGINE 100 UNIT/ML  SOLN Subcutaneous Inject 20 Units into the skin at bedtime.    Marland Kitchen POLYSACCHARIDE IRON COMPLEX 150 MG PO CAPS Oral Take 1 capsule (150 mg total) by mouth daily. 30 capsule 1  . LISINOPRIL 20 MG PO TABS Oral Take 20 mg by mouth daily.    Marland Kitchen METFORMIN HCL 500 MG PO TABS Oral Take 0.5 tablets (250 mg total) by mouth 2 (two) times daily with a meal. 60 tablet 1  . METHOCARBAMOL 500 MG PO TABS Oral Take 500 mg by mouth 2 (two) times daily as needed. For muscle spasms    . MORPHINE SULFATE 15 MG PO TABS Oral Take 15 mg by mouth every 4 (four) hours as needed. For pain    . SENNOSIDES 8.6 MG PO TABS Oral Take 2 tablets by mouth 2 (two) times daily as needed. For constipation    . SIMVASTATIN 40 MG PO TABS Oral Take 40 mg by mouth every evening.      BP 139/79  Pulse 83  Temp 98.3 F (36.8 C) (Oral)  Resp 16  SpO2 100%  Physical Exam  Nursing note and vitals  reviewed. Constitutional: She appears well-developed and well-nourished. No distress.  HENT:  Head: Normocephalic and atraumatic.  Neck: Neck supple.  Cardiovascular: Normal rate and regular rhythm.   Pulmonary/Chest: Effort normal and breath sounds normal. No respiratory distress. She has no wheezes. She has no rales.  Abdominal: Soft. She exhibits no distension. There is no tenderness. There is  no rebound and no guarding.  Neurological: She is alert. She has normal strength. No cranial nerve deficit or sensory deficit. GCS eye subscore is 4. GCS verbal subscore is 5. GCS motor subscore is 6.       CN II-XII intact, EOMs intact, no pronator drift, grip strengths equal bilaterally; strength 5/5 in all extremities, sensation intact in all extremities; finger to nose, rapid alternating movements normal.  Unable to assess gait and heel to shin as patient is s/p recent BKA.   Skin: She is not diaphoretic.       ED Course  Procedures (including critical care time)  Labs Reviewed  CBC WITH DIFFERENTIAL - Abnormal; Notable for the following:    Hemoglobin 11.1 (*)     HCT 34.3 (*)     All other components within normal limits  BASIC METABOLIC PANEL - Abnormal; Notable for the following:    Glucose, Bld 205 (*)     GFR calc non Af Amer 73 (*)     GFR calc Af Amer 84 (*)     All other components within normal limits  URINALYSIS, ROUTINE W REFLEX MICROSCOPIC   No results found.  10:42 AM Discussed patient with Dr Patria Mane.  Pt with vertiginous symptoms, no neurological deficits on exam though pt has had recent BKA and is unable to walk or perform heel to shin testing.  Pt has had recent thorough workup and neurological consult (please see Dr Marca Ancona note 01/07/12).  Last hospitalization d/c summary: "5-Dizziness: MRI show Subtle altered signal intensity in the superior left cerebellum on diffusion sequence. Question artifact versus very small acute infarct? Continue with plavix. PT, OT  consulted. Neuro consulted. Neuro doesn't think this was acute stroke, probably artifact. Dizziness autonomic dysfunction vs Inner ear problem. If reoccur she will need referral to othorrino. Patient had Doppler 1 month ago with No significant extracranial carotid artery stenosis demonstrated. Vertebrals are patent with antegrade flow. Dizziness resolved."   Date: 02/11/2012  Rate: 83  Rhythm: normal sinus rhythm  QRS Axis: normal  Intervals: normal  ST/T Wave abnormalities: nonspecific ST/T changes  Conduction Disutrbances:none  Narrative Interpretation: poor R wave progression  Old EKG Reviewed: unchanged   No diagnosis found.    MDM  Patient with dizziness, by exam appears to be peripheral vertigo.  Pt with recent MRI and neurology consult, is on plavix.  Labs unremarkable, EKG unchanged.  Patient signed out to Langley Adie, PA-C, at change of shift pending UA results and symptom control.          [place], [place] 02/11/12 1214

## 2012-02-11 NOTE — ED Notes (Signed)
BP 130/70. Pulse 98. CBG 173. Per PTAR pt woke up this am with HA and some dizziness. Pt had BKA a few weeks ago. A&O.

## 2012-02-11 NOTE — ED Provider Notes (Signed)
Medical screening examination/treatment/procedure(s) were performed by non-physician practitioner and as supervising physician I was immediately available for consultation/collaboration.   Lyanne Co, MD 02/11/12 1215

## 2012-02-11 NOTE — ED Notes (Signed)
Pt reporting woke up this morning with dizziness, felt the room spinning. Went to bed last night and felt normal. Hx of left BKA amputation about 1 week ago. Denying any pain. Still dizzy at this point. No neuro deficits noted. Pt alert, responding appropriately.

## 2012-02-17 ENCOUNTER — Encounter: Payer: Managed Care, Other (non HMO) | Admitting: Physical Medicine & Rehabilitation

## 2012-02-18 ENCOUNTER — Ambulatory Visit (INDEPENDENT_AMBULATORY_CARE_PROVIDER_SITE_OTHER): Payer: Managed Care, Other (non HMO) | Admitting: Internal Medicine

## 2012-02-18 ENCOUNTER — Encounter: Payer: Self-pay | Admitting: Internal Medicine

## 2012-02-18 VITALS — BP 140/86 | HR 84 | Ht 66.0 in | Wt 212.0 lb

## 2012-02-18 DIAGNOSIS — I10 Essential (primary) hypertension: Secondary | ICD-10-CM

## 2012-02-18 DIAGNOSIS — R079 Chest pain, unspecified: Secondary | ICD-10-CM

## 2012-02-18 NOTE — Patient Instructions (Addendum)
Your physician wants you to follow-up in: 12 months with Dr Allred You will receive a reminder letter in the mail two months in advance. If you don't receive a letter, please call our office to schedule the follow-up appointment.  Your physician has requested that you have a lexiscan myoview. For further information please visit [email or web address]. Please follow instruction sheet, as given.    

## 2012-02-19 ENCOUNTER — Encounter (HOSPITAL_COMMUNITY): Payer: Managed Care, Other (non HMO)

## 2012-02-22 ENCOUNTER — Encounter: Payer: Self-pay | Admitting: Internal Medicine

## 2012-02-22 NOTE — Assessment & Plan Note (Signed)
Stable No change required today  

## 2012-02-22 NOTE — Assessment & Plan Note (Signed)
Chest pain has both typical and atypical features.  She has multiple CAD risk factors.  I would therefore recommend lexiscan myoview at this time for further CV risk stratification.  If low risk, then medical management is advised.  If high risk then further testing may be required.   She will return for follow-up in 1 year

## 2012-02-22 NOTE — Progress Notes (Signed)
PCP:  [name], MD Primary Cardiologist:  Dr Ladona Ridgel  The patient presents today for routine cardiology followup.  She was recently seen in the ER for atypical chest pain.  She continues to have occasional shortness of breath and chest pain, though episodes have not increased.   She previously had an ILR implanted by Dr Ladona Ridgel for unexplained syncope.  He has subsequently removed this device.  She has had no further syncope.  Today, she denies symptoms of palpitations, orthopnea, PND, lower extremity edema, dizziness, presyncope, syncope, or neurologic sequela.  The patient feels that she is tolerating medications without difficulties and is otherwise without complaint today.   Past Medical History  Diagnosis Date  . Coronary atherosclerosis of native coronary artery     non obstructive by cath in 2008 and 2010  . Unspecified essential hypertension   . Other and unspecified hyperlipidemia   . Syncope and collapse     near-syncopal episode in November/2008  . Unspecified cardiovascular disease     Decreased left-ventricular fxn  . 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 Unspecified hereditary and idiopathic peripheral neuropathy   . Obesity, unspecified   . Esophageal reflux   . History of diabetes mellitus, type II   . Diaphragmatic hernia without mention of obstruction or gangrene   . Irritable bowel syndrome   . Morbid obesity   . Diabetes mellitus   . Cellulitis     left foot  . Anginal pain   . Stroke     2013-'no residual'   Past Surgical History  Procedure Date  . Invasive electrophysiologic study 5/09    followed by insertion of an implantable loop recorder. S/p removal   . Colonoscopy   . Cholecystectomy   . Multiple toe surgeries   . Cardiac catheterization     LAD 30%, circumflex 50%, OM 75%, RI 60% with small branch 80%, dominant RCA 60%, EF 45-50%  . I&d extremity 12/23/2011    Procedure: IRRIGATION AND DEBRIDEMENT  EXTREMITY;  Surgeon: Verlee Rossetti, MD;  Location: [facility] OR;  Service: Orthopedics;  Laterality: Left;  Left Foot  . I&d extremity 12/26/2011    Procedure: IRRIGATION AND DEBRIDEMENT EXTREMITY;  Surgeon: Toni Arthurs, MD;  Location: [facility] OR;  Service: Orthopedics;  Laterality: Left;  LEFT FOOT I&D WITH POSSIBLE WOUND VAC APPLICATION, POSSIBLE LEFT FIRST RAY AMPUTATION  . Amputation 12/30/2011    Procedure: AMPUTATION RAY;  Surgeon: Toni Arthurs, MD;  Location: [facility] OR;  Service: Orthopedics;  Laterality: Left;  LEFT FIRST RAY AMPUTATION  . Eye surgery     cataract  . Amputation 01/13/2012    Procedure: AMPUTATION BELOW KNEE;  Surgeon: Toni Arthurs, MD;  Location: [facility] OR;  Service: Orthopedics;  Laterality: Left;    Current Outpatient Prescriptions  Medication Sig Dispense Refill  . ciprofloxacin (CIPRO) 250 MG tablet Take 250 mg by mouth 2 (two) times daily.      . clindamycin (CLEOCIN) 300 MG capsule Take 600 mg by mouth 3 (three) times daily.      . clopidogrel (PLAVIX) 75 MG tablet Take 1 tablet (75 mg total) by mouth daily with breakfast.  30 tablet  3  . glimepiride (AMARYL) 4 MG tablet Take 4 mg by mouth daily.       . hydrochlorothiazide (HYDRODIURIL) 25 MG tablet Take 25 mg by mouth daily.      . insulin aspart (NOVOLOG) 100 UNIT/ML injection Inject 6 Units into the  skin 3 (three) times daily before meals.       . insulin glargine (LANTUS) 100 UNIT/ML injection Inject 20 Units into the skin at bedtime.      . iron polysaccharides (NIFEREX) 150 MG capsule Take 1 capsule (150 mg total) by mouth daily.  30 capsule  1  . lisinopril (PRINIVIL,ZESTRIL) 20 MG tablet Take 20 mg by mouth daily.      . meclizine (ANTIVERT) 25 MG tablet Take 1 tablet (25 mg total) by mouth 3 (three) times daily as needed.  21 tablet  0  . metFORMIN (GLUCOPHAGE) 500 MG tablet Take 0.5 tablets (250 mg total) by mouth 2 (two) times daily with a meal.  60 tablet  1  . methocarbamol (ROBAXIN) 500 MG tablet Take 500 mg by mouth 2  (two) times daily as needed. For muscle spasms      . morphine (MSIR) 15 MG tablet Take 15 mg by mouth every 4 (four) hours as needed. For pain      . senna (SENOKOT) 8.6 MG tablet Take 2 tablets by mouth 2 (two) times daily as needed. For constipation      . simvastatin (ZOCOR) 40 MG tablet Take 40 mg by mouth every evening.        Allergies  Allergen Reactions  . Banana Other (See Comments)    Facial swelling  . Penicillins Itching     edema  . Strawberry Other (See Comments)    Facial swelling    History   Social History  . Marital Status: Single    Spouse Name: N/A    Number of Children: N/A  . Years of Education: N/A   Occupational History  . Document [employer] company   Social History Main Topics  . Smoking status: Current Every Day Smoker -- 0.2 packs/day for 40 years    Types: Cigarettes  . Smokeless tobacco: Never Used  . Alcohol Use: 2.4 oz/week    4 Cans of beer per week     occ- foot ball season- 4 beers a week during football season.  . Drug Use: No  . Sexually Active: Not Currently    Birth Control/ Protection: Post-menopausal   Other Topics Concern  . Not on file   Social History Narrative   She drinks beer. Several on sundays. Does not routinely exercise. 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     Family History  Problem Relation Age of Onset  . Pneumonia Mother   . Gallbladder disease Mother     cancer  . Heart failure Mother   . Diabetes Father   . Coronary artery disease Father   . Stroke Neg Hx    Physical Exam: Filed Vitals:   02/18/12 1637  BP: 140/86  Pulse: 84  Height: 5\' 6"  (1.676 m)  Weight: 212 lb (96.163 kg)    GEN- The patient is well appearing, alert and oriented x 3 today.   Head- normocephalic, atraumatic Eyes-  Sclera clear, conjunctiva pink Ears- hearing intact Oropharynx- clear Neck- supple,   Lungs- Clear to ausculation bilaterally, normal work of breathing Heart-  Regular rate and rhythm, no murmurs, rubs or gallops, PMI not laterally displaced GI- soft, NT, ND, + BS Extremities- no clubbing, cyanosis, or edema, s/p L BKA MS- diffuse muscle atrophy Skin- no rash or lesion Psych- euthymic mood, full affect Neuro- strength and sensation are intact  ekg today reveals sinus rhythm 84 bpm, PR 184, QRS  80, Qtc 430, otherwise normal ekg  Assessment and Plan:

## 2012-02-27 ENCOUNTER — Encounter (HOSPITAL_COMMUNITY): Payer: Self-pay | Admitting: Pharmacy Technician

## 2012-03-03 ENCOUNTER — Encounter (HOSPITAL_COMMUNITY): Payer: Self-pay

## 2012-03-03 ENCOUNTER — Encounter (HOSPITAL_COMMUNITY)
Admission: RE | Admit: 2012-03-03 | Discharge: 2012-03-03 | Disposition: A | Discharge status: Home / Self Care | Payer: Managed Care, Other (non HMO) | Source: Ambulatory Visit | Attending: Orthopedic Surgery | Admitting: Orthopedic Surgery

## 2012-03-03 ENCOUNTER — Ambulatory Visit (HOSPITAL_BASED_OUTPATIENT_CLINIC_OR_DEPARTMENT_OTHER): Payer: Managed Care, Other (non HMO) | Admitting: Radiology

## 2012-03-03 VITALS — BP 188/96 | HR 75 | Ht 66.0 in | Wt 212.0 lb

## 2012-03-03 DIAGNOSIS — R079 Chest pain, unspecified: Secondary | ICD-10-CM

## 2012-03-03 DIAGNOSIS — E119 Type 2 diabetes mellitus without complications: Secondary | ICD-10-CM

## 2012-03-03 DIAGNOSIS — I2510 Atherosclerotic heart disease of native coronary artery without angina pectoris: Secondary | ICD-10-CM

## 2012-03-03 HISTORY — DX: Xerosis cutis: L85.3

## 2012-03-03 HISTORY — DX: Constipation, unspecified: K59.00

## 2012-03-03 HISTORY — DX: Dizziness and giddiness: R42

## 2012-03-03 LAB — SURGICAL PCR SCREEN: Staphylococcus aureus: NEGATIVE

## 2012-03-03 LAB — BASIC METABOLIC PANEL
CO2: 26 mEq/L (ref 19–32)
Calcium: 9.3 mg/dL (ref 8.4–10.5)
GFR calc non Af Amer: >90 mL/min (ref >90)
Potassium: 4.5 mEq/L (ref 3.5–5.1)
Sodium: 137 mEq/L (ref 135–145)

## 2012-03-03 LAB — CBC
Hemoglobin: 11.8 g/dL — ABNORMAL LOW (ref 12.0–15.0)
Platelets: 273 10*3/uL (ref 150–400)
RBC: 4.37 MIL/uL (ref 3.87–5.11)
WBC: 10.4 10*3/uL (ref 4.0–10.5)

## 2012-03-03 MED ORDER — TECHNETIUM TC 99M SESTAMIBI GENERIC - CARDIOLITE
33.0000 | Freq: Once | INTRAVENOUS | Status: AC | PRN
  Administered 2012-03-03: 33 via INTRAVENOUS

## 2012-03-03 MED ORDER — TECHNETIUM TC 99M SESTAMIBI GENERIC - CARDIOLITE
11.0000 | Freq: Once | INTRAVENOUS | Status: AC | PRN
  Administered 2012-03-03: 11 via INTRAVENOUS

## 2012-03-03 MED ORDER — REGADENOSON 0.4 MG/5ML IV SOLN
0.4000 mg | Freq: Once | INTRAVENOUS | Status: AC
  Administered 2012-03-03: 0.4 mg via INTRAVENOUS

## 2012-03-03 MED ORDER — CHLORHEXIDINE GLUCONATE 4 % EX LIQD: 60.0000 mL | Freq: Once | CUTANEOUS | Status: DC

## 2012-03-03 MED ORDER — VANCOMYCIN HCL IN DEXTROSE 1-5 GM/200ML-% IV SOLN
1000.0000 mg | INTRAVENOUS | Status: AC
  Administered 2012-03-04: 1000 mg via INTRAVENOUS
  Filled 2012-03-03: qty 200

## 2012-03-03 NOTE — Progress Notes (Signed)
[facility] SITE 3 NUCLEAR MED [id number] [place] [address] [phone]  Cardiology Nuclear Med Study  IZMA CASTANEDA is a 56 y.o. female     MRN : [id number]     DOB: [birth year]  Procedure Date: 03/03/2012  Nuclear Med Background Indication for Stress Test:  Evaluation for Ischemia, Surgical Clearance for  Revision of (L) BKA on 03/04/12 by Dr. Toni Arthurs, and Patient seen [facility] on 02/09/12 for Chest Pain with negative enzymes History:  '10 Cath:n/o CAD, EF=50%; '12 KGM:[id number] for ischemia, EF=44; 8/13 Echo:EF=55%, moderate LVH. Cardiac Risk Factors: CVA, Family History - CAD, Hypertension, IDDM Type 2, Lipids, Obesity and Smoker  Symptoms:  Chest Pain with and without Exertion (last episode of chest discomfort has been none since discharge), Dizziness and Near Syncope   Nuclear Pre-Procedure Caffeine/Decaff Intake:  None> 12 hrs NPO After: 7:30pm   Lungs:  Clear. O2 Sat: 98% on room air. IV 0.9% NS with Angio Cath:  24g  IV Site: R Hand, tolerated well IV Started by:  Irean Hong, RN  Chest Size (in):  42 Cup Size: D  Height: 5\' 6"  (1.676 m)  Weight:  212 lb (96.163 kg)  BMI:  Body mass index is 34.22 kg/(m^2). Tech Comments:  FBS was 147 at 7:00 am, took Lantus insulin last night , no insulin today.    Nuclear Med Study 1 or 2 day study: 1 day  Stress Test Type:  Lexiscan  Reading MD: Cassell Clement, MD  Order Authorizing Provider:  Hillis Range, MD  Resting Radionuclide: Technetium 52m Sestamibi  Resting Radionuclide Dose: 11.0 mCi   Stress Radionuclide:  Technetium 70m Sestamibi  Stress Radionuclide Dose: 33.0 mCi           Stress Protocol Rest HR: 75 Stress HR: 92  Rest BP: 188/96 Stress BP: 189/94  Exercise Time (min): n/a METS: n/a   Predicted Max HR: 163 bpm % Max HR: 56.44 bpm Rate Pressure Product: 34742   Dose of Adenosine (mg):  n/a Dose of Lexiscan: 0.4 mg  Dose of Atropine (mg): n/a Dose of Dobutamine:  n/a mcg/kg/min (at max HR)  Stress Test Technologist: Smiley Houseman, CMA-N  Nuclear Technologist:  Domenic Polite, CNMT     Rest Procedure:  Myocardial perfusion imaging was performed at rest 45 minutes following the intravenous administration of Technetium 60m Sestamibi.  Rest ECG: Nonspecific T-wave changes.  Stress Procedure:  The patient received IV Lexiscan 0.4 mg over 15-seconds.  Technetium 77m Sestamibi was injected at 30-seconds.  There were no significant changes with Lexiscan.  Quantitative spect images were obtained after a 45 minute delay.  Stress ECG: No significant change from baseline ECG  QPS Raw Data Images:  Normal; no motion artifact; normal heart/lung ratio. Stress Images:  Normal homogeneous uptake in all areas of the myocardium. Rest Images:  Normal homogeneous uptake in all areas of the myocardium. Subtraction (SDS):  No evidence of ischemia. Transient Ischemic Dilatation (Normal <1.22):  1.04 Lung/Heart Ratio (Normal <0.45):  0.30  Quantitative Gated Spect Images QGS EDV:  95 ml QGS ESV:  40 ml  Impression Exercise Capacity:  Lexiscan with no exercise. BP Response:  Normal blood pressure response. Clinical Symptoms:  No significant symptoms noted. ECG Impression:  No significant ST segment change suggestive of ischemia. Comparison with Prior Nuclear Study: No images to compare  Overall Impression:  Normal stress nuclear study.  LV Ejection Fraction: 57%.  LV Wall Motion:  NL LV Function; NL  Wall Motion  [employer]

## 2012-03-03 NOTE — Pre-Procedure Instructions (Signed)
20 [name]  03/03/2012   Your procedure is scheduled on:  Thursday March 04, 2012.  Report to [facility] at 1030 AM.  Call this number if you have problems the morning of surgery: [phone]   Remember:   Do not eat food or drink:After Midnight.    Take these medicines the morning of surgery with A SIP OF WATER: Morphine if needed for pain, Ciprofloxacin (Cipro), & Clindamycin (Cleocin)  Do not take any diabetic medications including insulins the morning of surgery   Do not wear jewelry, make-up or nail polish.  Do not wear lotions, powders, or perfumes.   Do not shave 48 hours prior to surgery.   Do not bring valuables to the hospital.  Contacts, dentures or bridgework may not be worn into surgery.  Leave suitcase in the car. After surgery it may be brought to your room.  For patients admitted to the hospital, checkout time is 11:00 AM the day of discharge.   Patients discharged the day of surgery will not be allowed to drive home.  Name and phone number of your driver:   Special Instructions: Shower using CHG 2 nights before surgery and the night before surgery.  If you shower the day of surgery use CHG.  Use special wash - you have one bottle of CHG for all showers.  You should use approximately 1/3 of the bottle for each shower.   Please read over the following fact sheets that you were given: Pain Booklet, Coughing and Deep Breathing, MRSA Information and Surgical Site Infection Prevention

## 2012-03-03 NOTE — Progress Notes (Signed)
Patient informed Nurse that she had a stress test today at [facility] with Dr. Johney Frame. Echo in [facility] 01/08/12. Patient had a cardiac cath at [facility] several years ago, but denied having a PCI. Patient denied having a sleep study.

## 2012-03-03 NOTE — Progress Notes (Signed)
Nurse called Dr. Laverta Baltimore office and spoke with Cordelia Pen about patients preadmit appointment and patient not having any orders in EPIC. Sherry informed Nurse that she sent Physician a message yesterday after 1600 informing him of no orders being in EPIC.

## 2012-03-04 ENCOUNTER — Observation Stay (HOSPITAL_COMMUNITY)
Admission: RE | Admit: 2012-03-04 | Discharge: 2012-03-05 | Disposition: A | Discharge status: Home Health | Payer: Managed Care, Other (non HMO) | Source: Ambulatory Visit | Attending: Orthopedic Surgery | Admitting: Orthopedic Surgery

## 2012-03-04 ENCOUNTER — Encounter (HOSPITAL_COMMUNITY): Payer: Self-pay | Admitting: Certified Registered Nurse Anesthetist

## 2012-03-04 ENCOUNTER — Encounter (HOSPITAL_COMMUNITY): Payer: Self-pay | Admitting: Surgery

## 2012-03-04 ENCOUNTER — Encounter (HOSPITAL_COMMUNITY)
Admission: RE | Disposition: A | Discharge status: Home Health | Payer: Self-pay | Source: Ambulatory Visit | Attending: Orthopedic Surgery

## 2012-03-04 ENCOUNTER — Ambulatory Visit (HOSPITAL_COMMUNITY): Payer: Managed Care, Other (non HMO) | Admitting: Certified Registered Nurse Anesthetist

## 2012-03-04 DIAGNOSIS — K589 Irritable bowel syndrome without diarrhea: Secondary | ICD-10-CM | POA: Insufficient documentation

## 2012-03-04 DIAGNOSIS — Z01812 Encounter for preprocedural laboratory examination: Secondary | ICD-10-CM | POA: Insufficient documentation

## 2012-03-04 DIAGNOSIS — E785 Hyperlipidemia, unspecified: Secondary | ICD-10-CM | POA: Insufficient documentation

## 2012-03-04 DIAGNOSIS — E119 Type 2 diabetes mellitus without complications: Secondary | ICD-10-CM | POA: Insufficient documentation

## 2012-03-04 DIAGNOSIS — F17200 Nicotine dependence, unspecified, uncomplicated: Secondary | ICD-10-CM | POA: Insufficient documentation

## 2012-03-04 DIAGNOSIS — Z8673 Personal history of transient ischemic attack (TIA), and cerebral infarction without residual deficits: Secondary | ICD-10-CM | POA: Insufficient documentation

## 2012-03-04 DIAGNOSIS — Z79899 Other long term (current) drug therapy: Secondary | ICD-10-CM | POA: Insufficient documentation

## 2012-03-04 DIAGNOSIS — K219 Gastro-esophageal reflux disease without esophagitis: Secondary | ICD-10-CM | POA: Insufficient documentation

## 2012-03-04 DIAGNOSIS — I2510 Atherosclerotic heart disease of native coronary artery without angina pectoris: Secondary | ICD-10-CM | POA: Insufficient documentation

## 2012-03-04 DIAGNOSIS — I10 Essential (primary) hypertension: Secondary | ICD-10-CM | POA: Insufficient documentation

## 2012-03-04 DIAGNOSIS — K449 Diaphragmatic hernia without obstruction or gangrene: Secondary | ICD-10-CM | POA: Insufficient documentation

## 2012-03-04 DIAGNOSIS — Y835 Amputation of limb(s) as the cause of abnormal reaction of the patient, or of later complication, without mention of misadventure at the time of the procedure: Secondary | ICD-10-CM | POA: Insufficient documentation

## 2012-03-04 DIAGNOSIS — T8789 Other complications of amputation stump: Principal | ICD-10-CM | POA: Insufficient documentation

## 2012-03-04 DIAGNOSIS — Z794 Long term (current) use of insulin: Secondary | ICD-10-CM | POA: Insufficient documentation

## 2012-03-04 DIAGNOSIS — T8130XA Disruption of wound, unspecified, initial encounter: Secondary | ICD-10-CM

## 2012-03-04 HISTORY — PX: AMPUTATION: SHX166

## 2012-03-04 LAB — GLUCOSE, CAPILLARY
Glucose-Capillary: 168 mg/dL — ABNORMAL HIGH (ref 70–99)
Glucose-Capillary: 132 mg/dL — ABNORMAL HIGH (ref 70–99)

## 2012-03-04 SURGERY — AMPUTATION BELOW KNEE
Anesthesia: General | Site: Leg Lower | Laterality: Left | Wound class: Clean

## 2012-03-04 MED ORDER — INSULIN ASPART 100 UNIT/ML ~~LOC~~ SOLN
6.0000 [IU] | Freq: Three times a day (TID) | SUBCUTANEOUS | Status: DC
  Administered 2012-03-04 – 2012-03-05 (×3): 6 [IU] via SUBCUTANEOUS

## 2012-03-04 MED ORDER — CLOPIDOGREL BISULFATE 75 MG PO TABS
75.0000 mg | ORAL_TABLET | Freq: Every day | ORAL | Status: DC
  Administered 2012-03-04 – 2012-03-05 (×2): 75 mg via ORAL
  Filled 2012-03-04 (×2): qty 1

## 2012-03-04 MED ORDER — METOCLOPRAMIDE HCL 10 MG PO TABS: 5.0000 mg | ORAL_TABLET | Freq: Three times a day (TID) | ORAL | Status: DC | PRN

## 2012-03-04 MED ORDER — ONDANSETRON HCL 4 MG PO TABS: 4.0000 mg | ORAL_TABLET | Freq: Four times a day (QID) | ORAL | Status: DC | PRN

## 2012-03-04 MED ORDER — MEPERIDINE HCL 25 MG/ML IJ SOLN: 6.2500 mg | INTRAMUSCULAR | Status: DC | PRN

## 2012-03-04 MED ORDER — GLIMEPIRIDE 4 MG PO TABS
4.0000 mg | ORAL_TABLET | Freq: Every day | ORAL | Status: DC
  Administered 2012-03-05: 4 mg via ORAL
  Filled 2012-03-04: qty 1

## 2012-03-04 MED ORDER — METFORMIN HCL 500 MG PO TABS
250.0000 mg | ORAL_TABLET | Freq: Two times a day (BID) | ORAL | Status: DC
  Administered 2012-03-04 – 2012-03-05 (×2): 250 mg via ORAL
  Filled 2012-03-04 (×4): qty 1

## 2012-03-04 MED ORDER — BACITRACIN ZINC 500 UNIT/GM EX OINT
TOPICAL_OINTMENT | CUTANEOUS | Status: AC
  Filled 2012-03-04: qty 15

## 2012-03-04 MED ORDER — BUPIVACAINE HCL 0.25 % IJ SOLN
INTRAMUSCULAR | Status: DC | PRN
  Administered 2012-03-04: 30 mL

## 2012-03-04 MED ORDER — SODIUM CHLORIDE 0.9 % IV SOLN: INTRAVENOUS | Status: DC

## 2012-03-04 MED ORDER — ONDANSETRON HCL 4 MG/2ML IJ SOLN: 4.0000 mg | Freq: Four times a day (QID) | INTRAMUSCULAR | Status: DC | PRN

## 2012-03-04 MED ORDER — SIMVASTATIN 40 MG PO TABS
40.0000 mg | ORAL_TABLET | Freq: Every evening | ORAL | Status: DC
Start: 2012-03-04 — End: 2012-03-05
  Administered 2012-03-04: 40 mg via ORAL
  Filled 2012-03-04 (×2): qty 1

## 2012-03-04 MED ORDER — MORPHINE SULFATE 15 MG PO TABS
15.0000 mg | ORAL_TABLET | ORAL | Status: DC | PRN
  Administered 2012-03-04 – 2012-03-05 (×3): 15 mg via ORAL
  Filled 2012-03-04 (×4): qty 1

## 2012-03-04 MED ORDER — LACTATED RINGERS IV SOLN
INTRAVENOUS | Status: DC
  Administered 2012-03-04: 12:00:00 via INTRAVENOUS

## 2012-03-04 MED ORDER — METHOCARBAMOL 500 MG PO TABS
500.0000 mg | ORAL_TABLET | Freq: Three times a day (TID) | ORAL | Status: DC | PRN
  Administered 2012-03-04 – 2012-03-05 (×2): 500 mg via ORAL
  Filled 2012-03-04 (×2): qty 1

## 2012-03-04 MED ORDER — POLYSACCHARIDE IRON COMPLEX 150 MG PO CAPS
150.0000 mg | ORAL_CAPSULE | Freq: Two times a day (BID) | ORAL | Status: DC
  Administered 2012-03-04 – 2012-03-05 (×2): 150 mg via ORAL
  Filled 2012-03-04 (×3): qty 1

## 2012-03-04 MED ORDER — METOCLOPRAMIDE HCL 5 MG/ML IJ SOLN: 5.0000 mg | Freq: Three times a day (TID) | INTRAMUSCULAR | Status: DC | PRN

## 2012-03-04 MED ORDER — LISINOPRIL 20 MG PO TABS
20.0000 mg | ORAL_TABLET | Freq: Every day | ORAL | Status: DC
  Administered 2012-03-04 – 2012-03-05 (×2): 20 mg via ORAL
  Filled 2012-03-04 (×2): qty 1

## 2012-03-04 MED ORDER — MIDAZOLAM HCL 5 MG/5ML IJ SOLN
INTRAMUSCULAR | Status: DC | PRN
  Administered 2012-03-04: 1 mg via INTRAVENOUS

## 2012-03-04 MED ORDER — HYDROCHLOROTHIAZIDE 25 MG PO TABS
25.0000 mg | ORAL_TABLET | Freq: Every day | ORAL | Status: DC
  Administered 2012-03-04 – 2012-03-05 (×2): 25 mg via ORAL
  Filled 2012-03-04 (×2): qty 1

## 2012-03-04 MED ORDER — ARTIFICIAL TEARS OP OINT
TOPICAL_OINTMENT | OPHTHALMIC | Status: DC | PRN
  Administered 2012-03-04: 1 via OPHTHALMIC

## 2012-03-04 MED ORDER — OXYCODONE HCL 5 MG PO TABS: 5.0000 mg | ORAL_TABLET | Freq: Once | ORAL | Status: DC | PRN

## 2012-03-04 MED ORDER — HYDROMORPHONE HCL PF 1 MG/ML IJ SOLN
0.2500 mg | INTRAMUSCULAR | Status: DC | PRN
  Administered 2012-03-04 (×2): 0.5 mg via INTRAVENOUS

## 2012-03-04 MED ORDER — BUPIVACAINE HCL (PF) 0.25 % IJ SOLN
INTRAMUSCULAR | Status: AC
  Filled 2012-03-04: qty 30

## 2012-03-04 MED ORDER — ONDANSETRON HCL 4 MG/2ML IJ SOLN
INTRAMUSCULAR | Status: DC | PRN
  Administered 2012-03-04: 4 mg via INTRAVENOUS

## 2012-03-04 MED ORDER — HYDROMORPHONE HCL PF 1 MG/ML IJ SOLN
INTRAMUSCULAR | Status: AC
  Administered 2012-03-04: 0.5 mg via INTRAVENOUS
  Filled 2012-03-04: qty 1

## 2012-03-04 MED ORDER — VANCOMYCIN HCL IN DEXTROSE 1-5 GM/200ML-% IV SOLN
1000.0000 mg | Freq: Two times a day (BID) | INTRAVENOUS | Status: AC
  Administered 2012-03-04: 1000 mg via INTRAVENOUS
  Filled 2012-03-04: qty 200

## 2012-03-04 MED ORDER — FENTANYL CITRATE 0.05 MG/ML IJ SOLN
INTRAMUSCULAR | Status: DC | PRN
  Administered 2012-03-04 (×4): 50 ug via INTRAVENOUS
  Administered 2012-03-04: 100 ug via INTRAVENOUS
  Administered 2012-03-04: 50 ug via INTRAVENOUS

## 2012-03-04 MED ORDER — MORPHINE SULFATE 2 MG/ML IJ SOLN
1.0000 mg | INTRAMUSCULAR | Status: DC | PRN
  Administered 2012-03-04: 20:00:00 via INTRAVENOUS
  Filled 2012-03-04: qty 1

## 2012-03-04 MED ORDER — MECLIZINE HCL 25 MG PO TABS
25.0000 mg | ORAL_TABLET | Freq: Three times a day (TID) | ORAL | Status: DC | PRN
  Filled 2012-03-04: qty 1

## 2012-03-04 MED ORDER — OXYCODONE HCL 5 MG/5ML PO SOLN: 5.0000 mg | Freq: Once | ORAL | Status: DC | PRN

## 2012-03-04 MED ORDER — BACITRACIN ZINC 500 UNIT/GM EX OINT
TOPICAL_OINTMENT | CUTANEOUS | Status: DC | PRN
  Administered 2012-03-04: 1 via TOPICAL

## 2012-03-04 MED ORDER — SENNOSIDES 8.6 MG PO TABS
2.0000 | ORAL_TABLET | Freq: Two times a day (BID) | ORAL | Status: DC
  Administered 2012-03-04 – 2012-03-05 (×2): 17.2 mg via ORAL
  Filled 2012-03-04 (×3): qty 2

## 2012-03-04 MED ORDER — PROPOFOL 10 MG/ML IV BOLUS
INTRAVENOUS | Status: DC | PRN
  Administered 2012-03-04: 120 mg via INTRAVENOUS

## 2012-03-04 MED ORDER — LACTATED RINGERS IV SOLN
INTRAVENOUS | Status: DC | PRN
  Administered 2012-03-04 (×2): via INTRAVENOUS

## 2012-03-04 MED ORDER — PROMETHAZINE HCL 25 MG/ML IJ SOLN: 6.2500 mg | INTRAMUSCULAR | Status: DC | PRN

## 2012-03-04 MED ORDER — MIDAZOLAM HCL 2 MG/2ML IJ SOLN: 0.5000 mg | Freq: Once | INTRAMUSCULAR | Status: DC | PRN

## 2012-03-04 MED ORDER — INSULIN GLARGINE 100 UNIT/ML ~~LOC~~ SOLN
20.0000 [IU] | Freq: Every day | SUBCUTANEOUS | Status: DC
  Administered 2012-03-04: 20 [IU] via SUBCUTANEOUS

## 2012-03-04 SURGICAL SUPPLY — 62 items
BANDAGE ELASTIC 6 VELCRO ST LF (GAUZE/BANDAGES/DRESSINGS) IMPLANT
BANDAGE ESMARK 6X9 LF (GAUZE/BANDAGES/DRESSINGS) IMPLANT
BLADE SAW RECIP 87.9 MT (BLADE) ×2 IMPLANT
BNDG COHESIVE 1X5 TAN STRL LF (GAUZE/BANDAGES/DRESSINGS) ×4 IMPLANT
BNDG COHESIVE 6X5 TAN STRL LF (GAUZE/BANDAGES/DRESSINGS) ×4 IMPLANT
BNDG ESMARK 6X9 LF (GAUZE/BANDAGES/DRESSINGS)
BNDG GAUZE STRTCH 6 (GAUZE/BANDAGES/DRESSINGS) IMPLANT
CHLORAPREP W/TINT 26ML (MISCELLANEOUS) ×2 IMPLANT
CLOTH BEACON ORANGE TIMEOUT ST (SAFETY) ×2 IMPLANT
COVER SURGICAL LIGHT HANDLE (MISCELLANEOUS) ×2 IMPLANT
CUFF TOURNIQUET SINGLE 34IN LL (TOURNIQUET CUFF) IMPLANT
CUFF TOURNIQUET SINGLE 44IN (TOURNIQUET CUFF) IMPLANT
DRAPE EXTREMITY T 121X128X90 (DRAPE) ×2 IMPLANT
DRAPE INCISE IOBAN 66X45 STRL (DRAPES) ×2 IMPLANT
DRAPE PROXIMA HALF (DRAPES) ×4 IMPLANT
DRAPE U-SHAPE 47X51 STRL (DRAPES) ×4 IMPLANT
DRSG MEPITEL 4X7.2 (GAUZE/BANDAGES/DRESSINGS) ×2 IMPLANT
DRSG PAD ABDOMINAL 8X10 ST (GAUZE/BANDAGES/DRESSINGS) ×4 IMPLANT
ELECT CAUTERY BLADE 6.4 (BLADE) IMPLANT
ELECT REM PT RETURN 9FT ADLT (ELECTROSURGICAL) ×2
ELECTRODE REM PT RTRN 9FT ADLT (ELECTROSURGICAL) ×1 IMPLANT
EVACUATOR 1/8 PVC DRAIN (DRAIN) IMPLANT
GLOVE BIO SURGEON STRL SZ8 (GLOVE) ×2 IMPLANT
GLOVE BIOGEL PI IND STRL 6.5 (GLOVE) ×2 IMPLANT
GLOVE BIOGEL PI IND STRL 8 (GLOVE) ×1 IMPLANT
GLOVE BIOGEL PI INDICATOR 6.5 (GLOVE) ×2
GLOVE BIOGEL PI INDICATOR 8 (GLOVE) ×1
GOWN PREVENTION PLUS XLARGE (GOWN DISPOSABLE) ×2 IMPLANT
GOWN STRL NON-REIN LRG LVL3 (GOWN DISPOSABLE) ×2 IMPLANT
KIT BASIN OR (CUSTOM PROCEDURE TRAY) ×2 IMPLANT
KIT ROOM TURNOVER OR (KITS) ×2 IMPLANT
MANIFOLD NEPTUNE II (INSTRUMENTS) ×2 IMPLANT
NEEDLE HYPO 25GX1X1/2 BEV (NEEDLE) ×2 IMPLANT
NS IRRIG 1000ML POUR BTL (IV SOLUTION) ×2 IMPLANT
PACK GENERAL/GYN (CUSTOM PROCEDURE TRAY) ×2 IMPLANT
PAD ARMBOARD 7.5X6 YLW CONV (MISCELLANEOUS) ×4 IMPLANT
PAD CAST 4YDX4 CTTN HI CHSV (CAST SUPPLIES) ×1 IMPLANT
PADDING CAST COTTON 4X4 STRL (CAST SUPPLIES) ×1
PADDING CAST COTTON 6X4 STRL (CAST SUPPLIES) ×2 IMPLANT
PADDING CAST SYN 6 (CAST SUPPLIES) ×1
PADDING CAST SYNTHETIC 6X4 NS (CAST SUPPLIES) ×1 IMPLANT
SET CYSTO W/LG BORE CLAMP LF (SET/KITS/TRAYS/PACK) ×2 IMPLANT
SPLINT PLASTER CAST XFAST 5X30 (CAST SUPPLIES) ×1 IMPLANT
SPLINT PLASTER XFAST SET 5X30 (CAST SUPPLIES) ×1
SPONGE GAUZE 4X4 12PLY (GAUZE/BANDAGES/DRESSINGS) ×2 IMPLANT
SPONGE LAP 18X18 X RAY DECT (DISPOSABLE) IMPLANT
STAPLER VISISTAT 35W (STAPLE) IMPLANT
STOCKINETTE IMPERVIOUS LG (DRAPES) IMPLANT
SUT ETHILON 3 0 FSL (SUTURE) ×4 IMPLANT
SUT MNCRL AB 3-0 PS2 18 (SUTURE) ×4 IMPLANT
SUT PDS 2 0 (SUTURE) ×4 IMPLANT
SUT PDS AB 0 CT 36 (SUTURE) ×4 IMPLANT
SUT PROLENE 3 0 PS 2 (SUTURE) ×2 IMPLANT
SUT PROLENE 3 0 SH 48 (SUTURE) IMPLANT
SUT SILK 2 0 (SUTURE) ×1
SUT SILK 2-0 18XBRD TIE 12 (SUTURE) ×1 IMPLANT
SWAB COLLECTION DEVICE MRSA (MISCELLANEOUS) IMPLANT
SYR CONTROL 10ML LL (SYRINGE) ×2 IMPLANT
TOWEL OR 17X24 6PK STRL BLUE (TOWEL DISPOSABLE) ×2 IMPLANT
TOWEL OR 17X26 10 PK STRL BLUE (TOWEL DISPOSABLE) ×2 IMPLANT
TUBE ANAEROBIC SPECIMEN COL (MISCELLANEOUS) IMPLANT
WATER STERILE IRR 1000ML POUR (IV SOLUTION) ×2 IMPLANT

## 2012-03-04 NOTE — Preoperative (Signed)
Beta Blockers   Reason not to administer Beta Blockers:Not Applicable 

## 2012-03-04 NOTE — Brief Op Note (Signed)
03/04/2012  2:27 PM  PATIENT:  [name]  56 y.o. female  PRE-OPERATIVE DIAGNOSIS:  Wound dehiscence s/p L BKA  POST-OPERATIVE DIAGNOSIS: same  Procedure(s): Revision left below knee amputation  SURGEON:  Toni Arthurs, MD  ASSISTANT: n/a  ANESTHESIA:   General  EBL:  minimal   TOURNIQUET:   Total Tourniquet Time Documented: Thigh (Left) - 2 minutes  COMPLICATIONS:  None apparent  DISPOSITION:  Extubated, awake and stable to recovery.  DICTATION ID:  [id number]

## 2012-03-04 NOTE — Anesthesia Procedure Notes (Signed)
Procedure Name: LMA Insertion Date/Time: 03/04/2012 1:14 PM Performed by: Angelica Pou Pre-anesthesia Checklist: Patient identified, Emergency Drugs available, Suction available, Patient being monitored and Timeout performed Patient Re-evaluated:Patient Re-evaluated prior to inductionOxygen Delivery Method: Circle system utilized Preoxygenation: Pre-oxygenation with 100% oxygen Intubation Type: IV induction LMA: LMA inserted and LMA with gastric port inserted LMA Size: 4.0 Number of attempts: 1 Placement Confirmation: positive ETCO2 and breath sounds checked- equal and bilateral Tube secured with: Tape Dental Injury: Teeth and Oropharynx as per pre-operative assessment

## 2012-03-04 NOTE — H&P (Signed)
[name] is an 56 y.o. female.   Chief Complaint: left leg wound HPI: 56 y/o female with PMH of cigarette smoking, DM and CAD is now 1 month s/p L BKA for L foot gangrene.  She did well initially, but her wound opened up.  She presents now for revision L BKA.  Past Medical History  Diagnosis Date  . Coronary atherosclerosis of native coronary artery     non obstructive by cath in 2008 and 2010  . Unspecified essential hypertension   . Other and unspecified hyperlipidemia   . Syncope and collapse     near-syncopal episode in November/2008  . Unspecified cardiovascular disease     Decreased left-ventricular fxn  . 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 Unspecified hereditary and idiopathic peripheral neuropathy   . Obesity, unspecified   . Esophageal reflux   . History of diabetes mellitus, type II   . Diaphragmatic hernia without mention of obstruction or gangrene   . Irritable bowel syndrome   . Morbid obesity   . Diabetes mellitus   . Cellulitis     left foot  . Anginal pain   . Stroke     2013-'no residual'  . Vertigo   . Dry skin   . Constipation     Past Surgical History  Procedure Date  . Invasive electrophysiologic study 5/09    followed by insertion of an implantable loop recorder. S/p removal   . Colonoscopy   . Cholecystectomy   . Multiple toe surgeries   . Cardiac catheterization     LAD 30%, circumflex 50%, OM 75%, RI 60% with small branch 80%, dominant RCA 60%, EF 45-50%  . I&d extremity 12/23/2011    Procedure: IRRIGATION AND DEBRIDEMENT EXTREMITY;  Surgeon: Verlee Rossetti, MD;  Location: [facility] OR;  Service: Orthopedics;  Laterality: Left;  Left Foot  . I&d extremity 12/26/2011    Procedure: IRRIGATION AND DEBRIDEMENT EXTREMITY;  Surgeon: Toni Arthurs, MD;  Location: [facility] OR;  Service: Orthopedics;  Laterality: Left;  LEFT FOOT I&D WITH POSSIBLE WOUND VAC APPLICATION, POSSIBLE LEFT FIRST RAY AMPUTATION  . Amputation  12/30/2011    Procedure: AMPUTATION RAY;  Surgeon: Toni Arthurs, MD;  Location: [facility] OR;  Service: Orthopedics;  Laterality: Left;  LEFT FIRST RAY AMPUTATION  . Eye surgery     cataract  . Amputation 01/13/2012    Procedure: AMPUTATION BELOW KNEE;  Surgeon: Toni Arthurs, MD;  Location: [facility] OR;  Service: Orthopedics;  Laterality: Left;    Family History  Problem Relation Age of Onset  . Pneumonia Mother   . Gallbladder disease Mother     cancer  . Heart failure Mother   . Diabetes Father   . Coronary artery disease Father   . Stroke Neg Hx    Social History:  reports that she has been smoking Cigarettes.  She has a 10 pack-year smoking history. She has never used smokeless tobacco. She reports that she drinks about 2.4 ounces of alcohol per week. She reports that she does not use illicit drugs.  Allergies:  Allergies  Allergen Reactions  . Banana Other (See Comments)    Facial swelling  . Penicillins Itching     edema  . Strawberry Other (See Comments)    Facial swelling    Medications Prior to Admission  Medication Sig Dispense Refill  . ciprofloxacin (CIPRO) 250 MG tablet Take 250 mg by mouth 2 (two) times daily.      Marland Kitchen  clindamycin (CLEOCIN) 300 MG capsule Take 600 mg by mouth 3 (three) times daily.      . clopidogrel (PLAVIX) 75 MG tablet Take 75 mg by mouth daily.      Marland Kitchen glimepiride (AMARYL) 4 MG tablet Take 4 mg by mouth daily.       . hydrochlorothiazide (HYDRODIURIL) 25 MG tablet Take 25 mg by mouth daily.      . insulin aspart (NOVOLOG) 100 UNIT/ML injection Inject 6 Units into the skin 3 (three) times daily before meals.       . insulin glargine (LANTUS) 100 UNIT/ML injection Inject 20 Units into the skin at bedtime.      . iron polysaccharides (NIFEREX) 150 MG capsule Take 150 mg by mouth 2 (two) times daily.      Marland Kitchen lisinopril (PRINIVIL,ZESTRIL) 20 MG tablet Take 20 mg by mouth daily.      . meclizine (ANTIVERT) 25 MG tablet Take 25 mg by mouth 3 (three) times daily as  needed. dizziness      . metFORMIN (GLUCOPHAGE) 500 MG tablet Take 250 mg by mouth 2 (two) times daily with a meal.      . methocarbamol (ROBAXIN) 500 MG tablet Take 500 mg by mouth 2 (two) times daily as needed. For muscle spasms      . morphine (MSIR) 15 MG tablet Take 15 mg by mouth every 4 (four) hours as needed. For pain      . senna (SENOKOT) 8.6 MG tablet Take 2 tablets by mouth 2 (two) times daily as needed. For constipation      . simvastatin (ZOCOR) 40 MG tablet Take 40 mg by mouth every evening.        Results for orders placed during the hospital encounter of March 22, 2012 (from the past 48 hour(s))  GLUCOSE, CAPILLARY     Status: Abnormal   Collection Time   03/22/2012 11:08 AM      Component Value Range Comment   Glucose-Capillary 168 (*) 70 - 99 mg/dL    No results found.  ROS  No recent f/c/n/v/wt loss  Blood pressure 160/92, pulse 85, temperature 97.4 F (36.3 C), temperature source Oral, resp. rate 18, SpO2 100.00%. Physical Exam wn wd woman in nad.  A and O x 4.  Mood and affect normal.  EOMI.  Respirations unlabored.  R leg with open BK amputation wound.  No signs of cellulitis or purulent drainage.  Skin o/w healthy.  5/5 strength at hamstring and quad.  Sens to LT intact at stump.  Assessment/Plan L BKA wound dehiscence.  To OR for revision BKA.  The risks and benefits of the alternative treatment options have been discussed in detail.  The patient wishes to proceed with surgery and specifically understands risks of bleeding, infection, nerve damage, blood clots, need for additional surgery, amputation and death.   Toni Arthurs 03/22/12, 12:55 PM

## 2012-03-04 NOTE — Anesthesia Postprocedure Evaluation (Signed)
  Anesthesia Post-op Note  Patient: [name]  Procedure(s) Performed: Procedure(s) (LRB) with comments: AMPUTATION BELOW KNEE (Left) - Revision of Left Below Knee Amputation  Patient Location: PACU  Anesthesia Type: General  Level of Consciousness: awake  Airway and Oxygen Therapy: Patient Spontanous Breathing  Post-op Pain: mild  Post-op Assessment: Post-op Vital signs reviewed, Patient's Cardiovascular Status Stable, Respiratory Function Stable, Patent Airway, No signs of Nausea or vomiting and Pain level controlled  Post-op Vital Signs: stable  Complications: No apparent anesthesia complications

## 2012-03-04 NOTE — Anesthesia Preprocedure Evaluation (Signed)
Anesthesia Evaluation  Patient identified by MRN, date of birth, ID band Patient awake    Reviewed: Allergy & Precautions, H&P , NPO status , Patient's Chart, lab work & pertinent test results  History of Anesthesia Complications Negative for: history of anesthetic complications  Airway Mallampati: I TM Distance: >3 FB Neck ROM: Full    Dental  (+) Teeth Intact, Dental Advisory Given and Missing   Pulmonary COPDCurrent Smoker,  breath sounds clear to auscultation  Pulmonary exam normal       Cardiovascular hypertension, - angina+ CAD and + Peripheral Vascular Disease Rhythm:Regular Rate:Normal  Stress test yesterday: no ischemia, EF> 55%, normal LVF   Neuro/Psych TIACVA, No Residual Symptoms    GI/Hepatic Neg liver ROS, GERD-  Controlled,  Endo/Other  diabetes (glu 168), Well Controlled, Type 2, Insulin Dependent and Oral Hypoglycemic AgentsMorbid obesity  Renal/GU negative Renal ROS     Musculoskeletal   Abdominal (+) + obese,   Peds  Hematology   Anesthesia Other Findings   Reproductive/Obstetrics                           Anesthesia Physical Anesthesia Plan  ASA: III  Anesthesia Plan: General   Post-op Pain Management:    Induction: Intravenous  Airway Management Planned: LMA  Additional Equipment:   Intra-op Plan:   Post-operative Plan:   Informed Consent:   Dental advisory given  Plan Discussed with: CRNA and Surgeon  Anesthesia Plan Comments: (Plan routine monitors, GA- LMA OK)        Anesthesia Quick Evaluation

## 2012-03-04 NOTE — Transfer of Care (Signed)
Immediate Anesthesia Transfer of Care Note  Patient: [name]  Procedure(s) Performed: Procedure(s) (LRB) with comments: AMPUTATION BELOW KNEE (Left) - Revision of Left Below Knee Amputation  Patient Location: PACU  Anesthesia Type: General  Level of Consciousness: awake, alert , oriented and patient cooperative  Airway & Oxygen Therapy: Patient Spontanous Breathing and Patient connected to nasal cannula oxygen  Post-op Assessment: Report given to PACU RN, Post -op Vital signs reviewed and stable. Pt hypertensive, SBP 180-190. Denies pain, appears comfortable.   Post vital signs: Reviewed and stable  Complications: No apparent anesthesia complications

## 2012-03-05 ENCOUNTER — Encounter (HOSPITAL_COMMUNITY): Payer: Self-pay | Admitting: Orthopedic Surgery

## 2012-03-05 LAB — GLUCOSE, CAPILLARY
Glucose-Capillary: 155 mg/dL — ABNORMAL HIGH (ref 70–99)
Glucose-Capillary: 142 mg/dL — ABNORMAL HIGH (ref 70–99)

## 2012-03-05 MED ORDER — MORPHINE SULFATE 15 MG PO TABS: 15.0000 mg | ORAL_TABLET | ORAL | Status: DC | PRN

## 2012-03-05 MED ORDER — MORPHINE SULFATE 15 MG PO TABS
15.0000 mg | ORAL_TABLET | Freq: Once | ORAL | Status: AC
  Administered 2012-03-05: 15 mg via ORAL

## 2012-03-05 NOTE — Progress Notes (Signed)
[facility]  Patient Status: Active (receiving services up to time of hospitalization)  [facility] is providing the following services: RN and PT  If patient discharges after hours, please call [phone].   [name] 03/05/2012, 9:57 AM

## 2012-03-05 NOTE — Progress Notes (Signed)
Discharge instructions completed with patient, including prescription medication for Morphine 15mg  tablet.  Patient instructed to call physician office for follow up visit within next two weeks, call 911 for chest pain, shortness of breath.  Call physician office for increased pain, unrelieved by pain medication, fever > 101, drainage, or foul smell from wound site.  Patient also instructed to drink plenty of fluids to avoid constipation.  Non-weight bearing to LLE as well as not getting site wet.   Patient instructed all discharge medications and activity, verbalized understanding no further questions or concerns were voiced at this time.

## 2012-03-05 NOTE — Op Note (Signed)
[name]              ACCOUNT NO.:  [id number]  MEDICAL RECORD NO.:  [id number]  LOCATION:  [id number]                        FACILITY:  [facility]  PHYSICIAN:  Toni Arthurs, MD        DATE OF BIRTH:  08/27/1955  DATE OF PROCEDURE:  03/04/2012 DATE OF DISCHARGE:                              OPERATIVE REPORT   PREOPERATIVE DIAGNOSIS:  Wound dehiscence of the left below-knee amputation stump.  POSTOPERATIVE DIAGNOSIS:  Wound dehiscence of the left below-knee amputation stump.  PROCEDURE:  Revision left below-knee amputation.  SURGEON:  Toni Arthurs, MD  ANESTHESIA:  General.  ESTIMATED BLOOD LOSS:  Minimal.  TOURNIQUET TIME:  2 minutes at 300 mmHg.  COMPLICATIONS:  None apparent.  DISPOSITION:  Extubated awake and stable to recovery.  INDICATIONS FOR PROCEDURE:  The patient is a 56 year old woman with past medical history significant for diabetes, cigarette smoking, coronary artery disease.  She underwent left below-knee amputation approximately a month ago for gangrenous left foot.  She initially did well, but then her wound opened anteriorly.  She presents now for revision amputation to close the wound dehiscence.  She understands the risks and benefits, the alternative treatment options and elects surgical treatment.  She specifically understands risks of bleeding, infection, nerve damage, blood clots, need for additional surgery, revision amputation, and death.  PROCEDURE IN DETAIL:  After preoperative consent was obtained, the correct operative site was identified.  The patient was brought to the operating room and placed supine on the operating table.  General anesthesia was induced.  Preoperative antibiotics were administered. Surgical time-out was taken.  The left lower extremity was prepped and draped in standard sterile fashion with tourniquet around the thigh. The extremity was exsanguinated and tourniquet was inflated to 300 mmHg. The patient's wound  dehiscence area was identified anteriorly.  The entire anterior incision was excised with a #10 blade.  The open area was also excised in its entirety down to the level of the tibia.  Given the proximity of the end of the tibia to the open wound, the decision was made to shorten to a level of healthy bone.  Periosteal elevator was used to dissect around the distal tibia.  The reciprocating saw was then used to cut the distal 2 cm off.  A rasp was used to smooth the cut edges of the tibia.  Fibular cut was of the appropriate length and the tissue in this area all appeared healthy.  The wound was then irrigated with 3 L of normal saline.  The wound was then closed repairing the posterior muscle flap back to the anterior tibial periosteum with inverted simple sutures of 0-PDS.  The subcutaneous tissue was all approximated with inverted simple sutures of 3-0 Monocryl.  The skin was closed with 3-0 nylon horizontal mattress sutures.  The tourniquet had been released at 2 minutes as it was not effective in controlling bleeding.  Hemostasis was achieved prior to closure.  The area around the incision was infiltrated with 0.25% plain Marcaine.  Sterile dressings were applied followed by a well-padded posterior splint.  The patient was then awakened from anesthesia and transported to the recovery room in stable condition.  FOLLOWUP  PLAN:  The patient will be nonweightbearing on the left lower extremity.  She will be observed overnight for pain control.  She will follow up with me in 2 weeks in clinic.     Toni Arthurs, MD     JH/MEDQ  D:  03/04/2012  T:  03/05/2012  Job:  [id number]

## 2012-03-05 NOTE — Progress Notes (Signed)
Utilization review completed. Cheryal Salas, RN, BSN. 

## 2012-03-05 NOTE — Discharge Summary (Signed)
Physician Discharge Summary  Patient ID: [name] MRN: [id number] DOB/AGE: [birth year] 56 y.o.  Admit date: 03/04/2012 Discharge date: 03/05/2012  Admission Diagnoses:  Htn, DM, stroke, CAD, left foot gangrene s/p BKA c/b wound dehiscence  Discharge Diagnoses:  Same S/p revision L BKA  Discharged Condition: stable  Hospital Course: Pt was admitted on 10/17 and taken to the OR where a revision L BKA was performed.  She tolerated the procedure well and was kept overnight for observation.  Overnight her hospital course was unremarkable and she is discharged home in stable condition.  Consults: None  Significant Diagnostic Studies: none  Treatments: surgery: revision L BKA  Discharge Exam: Blood pressure 144/60, pulse 88, temperature 98.8 F (37.1 C), temperature source Oral, resp. rate 18, height 5\' 6"  (1.676 m), weight 96.163 kg (212 lb), SpO2 100.00%. wn wd woman in nad.  L LE splinted.  Splint intact.    Disposition: 01-Home or Self Care  Discharge Orders    Future Appointments: Provider: Department: Dept Phone: Center:   03/17/2012 1:20 PM Ranelle Oyster, MD [facility] [phone] CPR     Future Orders Please Complete By Expires   Diet - low sodium heart healthy      Call MD / Call 911      Comments:   If you experience chest pain or shortness of breath, CALL 911 and be transported to the hospital emergency room.  If you develope a fever above 101 F, pus (white drainage) or increased drainage or redness at the wound, or calf pain, call your surgeon's office.   Constipation Prevention      Comments:   Drink plenty of fluids.  Prune juice may be helpful.  You may use a stool softener, such as Colace (over the counter) 100 mg twice a day.  Use MiraLax (over the counter) for constipation as needed.   Increase activity slowly as tolerated      Non weight bearing          Medication List     As of 03/05/2012  7:35 AM    TAKE these medications           ciprofloxacin 250 MG tablet   Commonly known as: CIPRO   Take 250 mg by mouth 2 (two) times daily.      clindamycin 300 MG capsule   Commonly known as: CLEOCIN   Take 600 mg by mouth 3 (three) times daily.      clopidogrel 75 MG tablet   Commonly known as: PLAVIX   Take 75 mg by mouth daily.      glimepiride 4 MG tablet   Commonly known as: AMARYL   Take 4 mg by mouth daily.      hydrochlorothiazide 25 MG tablet   Commonly known as: HYDRODIURIL   Take 25 mg by mouth daily.      insulin aspart 100 UNIT/ML injection   Commonly known as: novoLOG   Inject 6 Units into the skin 3 (three) times daily before meals.      insulin glargine 100 UNIT/ML injection   Commonly known as: LANTUS   Inject 20 Units into the skin at bedtime.      iron polysaccharides 150 MG capsule   Commonly known as: NIFEREX   Take 150 mg by mouth 2 (two) times daily.      lisinopril 20 MG tablet   Commonly known as: PRINIVIL,ZESTRIL   Take 20 mg by mouth daily.  meclizine 25 MG tablet   Commonly known as: ANTIVERT   Take 25 mg by mouth 3 (three) times daily as needed. dizziness      metFORMIN 500 MG tablet   Commonly known as: GLUCOPHAGE   Take 250 mg by mouth 2 (two) times daily with a meal.      methocarbamol 500 MG tablet   Commonly known as: ROBAXIN   Take 500 mg by mouth 2 (two) times daily as needed. For muscle spasms      morphine 15 MG tablet   Commonly known as: MSIR   Take 1-2 tablets (15-30 mg total) by mouth every 4 (four) hours as needed.      morphine 15 MG tablet   Commonly known as: MSIR   Take 15 mg by mouth every 4 (four) hours as needed. For pain      senna 8.6 MG tablet   Commonly known as: SENOKOT   Take 2 tablets by mouth 2 (two) times daily as needed. For constipation      simvastatin 40 MG tablet   Commonly known as: ZOCOR   Take 40 mg by mouth every evening.           Follow-up Information    Follow up with Reem Fleury, Jonny Ruiz, MD. Schedule an appointment  as soon as possible for a visit in 2 weeks.   Contact information:   [address], [address] [place] [address] [phone]          Signed: Toni Arthurs 03/05/2012, 7:35 AM

## 2012-03-15 ENCOUNTER — Ambulatory Visit: Payer: Managed Care, Other (non HMO) | Admitting: Nurse Practitioner

## 2012-03-17 ENCOUNTER — Encounter: Payer: Managed Care, Other (non HMO) | Admitting: Physical Medicine & Rehabilitation

## 2012-03-19 ENCOUNTER — Ambulatory Visit (INDEPENDENT_AMBULATORY_CARE_PROVIDER_SITE_OTHER): Payer: Managed Care, Other (non HMO) | Admitting: Nurse Practitioner

## 2012-03-19 ENCOUNTER — Encounter: Payer: Self-pay | Admitting: Nurse Practitioner

## 2012-03-19 VITALS — BP 140/80 | HR 84 | Ht 66.0 in | Wt 212.0 lb

## 2012-03-19 DIAGNOSIS — R0789 Other chest pain: Secondary | ICD-10-CM

## 2012-03-19 DIAGNOSIS — R079 Chest pain, unspecified: Secondary | ICD-10-CM

## 2012-03-19 NOTE — Patient Instructions (Addendum)
Stay on your current medicines  Let us know if your chest pain returns  Your stress test looked good  We will see you back next year as scheduled.   Call the [facility] office at [phone] if you have any questions, problems or concerns.

## 2012-03-19 NOTE — Progress Notes (Signed)
[name] Date of Birth: 1955/10/14 Medical Record #[id number]  History of Present Illness: [name] is seen today for a follow up visit. She is seen for Dr. Ladona Ridgel. She has multiple medical issues which are as outlined below. No CAD noted. Has multiple risk factors. Has PVD and just undergone revision of her left leg amputation.   She was here earlier this month with chest pain. Had a negative Myoview.  She comes back today. She is here with a family member. She is in a wheelchair. She says she is doing ok. No more chest pain. Not short of breath. Not lightheaded or dizzy. No passing out spells.   Current Outpatient Prescriptions on File Prior to Visit  Medication Sig Dispense Refill  . ciprofloxacin (CIPRO) 250 MG tablet Take 250 mg by mouth 2 (two) times daily.      . clindamycin (CLEOCIN) 300 MG capsule Take 600 mg by mouth 3 (three) times daily.      . clopidogrel (PLAVIX) 75 MG tablet Take 75 mg by mouth daily.      Marland Kitchen glimepiride (AMARYL) 4 MG tablet Take 4 mg by mouth daily.       . hydrochlorothiazide (HYDRODIURIL) 25 MG tablet Take 25 mg by mouth daily.      . insulin aspart (NOVOLOG) 100 UNIT/ML injection Inject 6 Units into the skin 3 (three) times daily before meals.       . insulin glargine (LANTUS) 100 UNIT/ML injection Inject 20 Units into the skin at bedtime.      . iron polysaccharides (NIFEREX) 150 MG capsule Take 150 mg by mouth 2 (two) times daily.      Marland Kitchen lisinopril (PRINIVIL,ZESTRIL) 20 MG tablet Take 20 mg by mouth daily.      . meclizine (ANTIVERT) 25 MG tablet Take 25 mg by mouth 3 (three) times daily as needed. dizziness      . metFORMIN (GLUCOPHAGE) 500 MG tablet Take 250 mg by mouth 2 (two) times daily with a meal.      . methocarbamol (ROBAXIN) 500 MG tablet Take 500 mg by mouth 2 (two) times daily as needed. For muscle spasms      . morphine (MSIR) 15 MG tablet Take 15 mg by mouth every 4 (four) hours as needed. For pain      . morphine (MSIR) 15 MG  tablet Take 1-2 tablets (15-30 mg total) by mouth every 4 (four) hours as needed.  40 tablet  0  . senna (SENOKOT) 8.6 MG tablet Take 2 tablets by mouth 2 (two) times daily as needed. For constipation      . simvastatin (ZOCOR) 40 MG tablet Take 40 mg by mouth every evening.        Allergies  Allergen Reactions  . Banana Other (See Comments)    Facial swelling  . Penicillins Itching     edema  . Strawberry Other (See Comments)    Facial swelling    Past Medical History  Diagnosis Date  . Coronary atherosclerosis of native coronary artery     non obstructive by cath in 2008 and 2010  . Unspecified essential hypertension   . Other and unspecified hyperlipidemia   . Syncope and collapse     near-syncopal episode in November/2008  . Unspecified cardiovascular disease     Decreased left-ventricular fxn  . 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 Unspecified hereditary and idiopathic peripheral neuropathy   . Obesity,  unspecified   . Esophageal reflux   . History of diabetes mellitus, type II   . Diaphragmatic hernia without mention of obstruction or gangrene   . Irritable bowel syndrome   . Morbid obesity   . Diabetes mellitus   . Cellulitis     left foot  . Anginal pain   . Stroke     2013-'no residual'  . Vertigo   . Dry skin   . Constipation     Past Surgical History  Procedure Date  . Invasive electrophysiologic study 5/09    followed by insertion of an implantable loop recorder. S/p removal   . Colonoscopy   . Cholecystectomy   . Multiple toe surgeries   . Cardiac catheterization     LAD 30%, circumflex 50%, OM 75%, RI 60% with small branch 80%, dominant RCA 60%, EF 45-50%  . I&d extremity 12/23/2011    Procedure: IRRIGATION AND DEBRIDEMENT EXTREMITY;  Surgeon: Verlee Rossetti, MD;  Location: [facility] OR;  Service: Orthopedics;  Laterality: Left;  Left Foot  . I&d extremity 12/26/2011    Procedure: IRRIGATION AND DEBRIDEMENT  EXTREMITY;  Surgeon: Toni Arthurs, MD;  Location: [facility] OR;  Service: Orthopedics;  Laterality: Left;  LEFT FOOT I&D WITH POSSIBLE WOUND VAC APPLICATION, POSSIBLE LEFT FIRST RAY AMPUTATION  . Amputation 12/30/2011    Procedure: AMPUTATION RAY;  Surgeon: Toni Arthurs, MD;  Location: [facility] OR;  Service: Orthopedics;  Laterality: Left;  LEFT FIRST RAY AMPUTATION  . Eye surgery     cataract  . Amputation 01/13/2012    Procedure: AMPUTATION BELOW KNEE;  Surgeon: Toni Arthurs, MD;  Location: [facility] OR;  Service: Orthopedics;  Laterality: Left;  . Amputation 03/04/2012    Procedure: AMPUTATION BELOW KNEE;  Surgeon: Toni Arthurs, MD;  Location: [facility] OR;  Service: Orthopedics;  Laterality: Left;  Revision of Left Below Knee Amputation    History  Smoking status  . Current Every Day Smoker -- 0.2 packs/day for 40 years  . Types: Cigarettes  Smokeless tobacco  . Never Used    History  Alcohol Use  . 2.4 oz/week  . 4 Cans of beer per week    occ- foot ball season- 4 beers a week during football season.    Family History  Problem Relation Age of Onset  . Pneumonia Mother   . Gallbladder disease Mother     cancer  . Heart failure Mother   . Diabetes Father   . Coronary artery disease Father   . Stroke Neg Hx     Review of Systems: The review of systems is per the HPI.  All other systems were reviewed and are negative.  Physical Exam: BP 140/80  Pulse 84  Ht 5\' 6"  (1.676 m)  Wt 212 lb (96.163 kg)  BMI 34.22 kg/m2 Patient is very pleasant and in no acute distress. She is morbidly obese. Skin is warm and dry. Color is normal.  HEENT is unremarkable. Normocephalic/atraumatic. PERRL. Sclera are nonicteric. Neck is supple. No masses. No JVD. Lungs are fairly clear. Cardiac exam shows a regular rate and rhythm. Abdomen is soft. Extremities are without edema. Left leg amputation noted. Gait is not tested. No gross neurologic deficits noted.   LABORATORY DATA:  Lab Results  Component Value Date   WBC  10.4 03/03/2012   HGB 11.8* 03/03/2012   HCT 36.5 03/03/2012   PLT 273 03/03/2012   GLUCOSE 212* 03/03/2012   CHOL 159 01/08/2012   TRIG 199* 01/08/2012   HDL 36*  01/08/2012   LDLCALC 83 01/08/2012   ALT 30 02/01/2012   AST 22 02/01/2012   NA 137 03/03/2012   K 4.5 03/03/2012   CL 101 03/03/2012   CREATININE 0.65 03/03/2012   BUN 11 03/03/2012   CO2 26 03/03/2012   TSH 0.573 01/07/2012   INR 0.94 12/15/2011   HGBA1C 8.7* 01/07/2012   Myoview Impression  Exercise Capacity: Lexiscan with no exercise.  BP Response: Normal blood pressure response.  Clinical Symptoms: No significant symptoms noted.  ECG Impression: No significant ST segment change suggestive of ischemia.  Comparison with Prior Nuclear Study: No images to compare   Overall Impression:  Normal stress nuclear study.  LV Ejection Fraction: 57%. LV Wall Motion: NL LV Function; NL Wall Motion   Thomas Brackbill   Assessment / Plan: 1. Past complaint of chest pain - negative Myoview - she is doing better. No further treatment needed at this time.  2. PVD - recent revision of her left leg amputation  3. HTN - Recheck of her blood pressure was 140/80 by me.  We will see her back at her regular visit. She is to let us know if she has any more issues with chest pain.   Patient is agreeable to this plan and will call if any problems develop in the interim.

## 2012-04-12 ENCOUNTER — Encounter
Payer: Managed Care, Other (non HMO) | Attending: Physical Medicine & Rehabilitation | Admitting: Physical Medicine & Rehabilitation

## 2012-05-26 ENCOUNTER — Encounter (HOSPITAL_COMMUNITY): Payer: Self-pay | Admitting: Emergency Medicine

## 2012-05-26 ENCOUNTER — Emergency Department (HOSPITAL_COMMUNITY)
Admission: EM | Admit: 2012-05-26 | Discharge: 2012-05-26 | Disposition: A | Discharge status: Home / Self Care | Payer: Managed Care, Other (non HMO) | Attending: Emergency Medicine | Admitting: Emergency Medicine

## 2012-05-26 ENCOUNTER — Emergency Department (HOSPITAL_COMMUNITY): Payer: Managed Care, Other (non HMO)

## 2012-05-26 DIAGNOSIS — Z872 Personal history of diseases of the skin and subcutaneous tissue: Secondary | ICD-10-CM | POA: Insufficient documentation

## 2012-05-26 DIAGNOSIS — Z8673 Personal history of transient ischemic attack (TIA), and cerebral infarction without residual deficits: Secondary | ICD-10-CM | POA: Insufficient documentation

## 2012-05-26 DIAGNOSIS — I10 Essential (primary) hypertension: Secondary | ICD-10-CM | POA: Insufficient documentation

## 2012-05-26 DIAGNOSIS — Z8679 Personal history of other diseases of the circulatory system: Secondary | ICD-10-CM | POA: Insufficient documentation

## 2012-05-26 DIAGNOSIS — R51 Headache: Secondary | ICD-10-CM | POA: Insufficient documentation

## 2012-05-26 DIAGNOSIS — E785 Hyperlipidemia, unspecified: Secondary | ICD-10-CM | POA: Insufficient documentation

## 2012-05-26 DIAGNOSIS — Z8669 Personal history of other diseases of the nervous system and sense organs: Secondary | ICD-10-CM | POA: Insufficient documentation

## 2012-05-26 DIAGNOSIS — Z7902 Long term (current) use of antithrombotics/antiplatelets: Secondary | ICD-10-CM | POA: Insufficient documentation

## 2012-05-26 DIAGNOSIS — Z79899 Other long term (current) drug therapy: Secondary | ICD-10-CM | POA: Insufficient documentation

## 2012-05-26 DIAGNOSIS — I2510 Atherosclerotic heart disease of native coronary artery without angina pectoris: Secondary | ICD-10-CM | POA: Insufficient documentation

## 2012-05-26 DIAGNOSIS — F17200 Nicotine dependence, unspecified, uncomplicated: Secondary | ICD-10-CM | POA: Insufficient documentation

## 2012-05-26 DIAGNOSIS — Z794 Long term (current) use of insulin: Secondary | ICD-10-CM | POA: Insufficient documentation

## 2012-05-26 DIAGNOSIS — E119 Type 2 diabetes mellitus without complications: Secondary | ICD-10-CM | POA: Insufficient documentation

## 2012-05-26 DIAGNOSIS — R209 Unspecified disturbances of skin sensation: Secondary | ICD-10-CM | POA: Insufficient documentation

## 2012-05-26 DIAGNOSIS — Z8719 Personal history of other diseases of the digestive system: Secondary | ICD-10-CM | POA: Insufficient documentation

## 2012-05-26 DIAGNOSIS — H538 Other visual disturbances: Secondary | ICD-10-CM | POA: Insufficient documentation

## 2012-05-26 LAB — CBC WITH DIFFERENTIAL/PLATELET
Eosinophils Absolute: 0.2 10*3/uL (ref 0.0–0.7)
HCT: 38 % (ref 36.0–46.0)
Hemoglobin: 12.1 g/dL (ref 12.0–15.0)
Lymphs Abs: 2.2 10*3/uL (ref 0.7–4.0)
MCH: 26.2 pg (ref 26.0–34.0)
MCHC: 31.8 g/dL (ref 30.0–36.0)
MCV: 82.4 fL (ref 78.0–100.0)
Monocytes Absolute: 0.3 10*3/uL (ref 0.1–1.0)
Monocytes Relative: 3 % (ref 3–12)
Neutrophils Relative %: 67 % (ref 43–77)
RBC: 4.61 MIL/uL (ref 3.87–5.11)

## 2012-05-26 LAB — BASIC METABOLIC PANEL
BUN: 14 mg/dL (ref 6–23)
Creatinine, Ser: 0.7 mg/dL (ref 0.50–1.10)
GFR calc non Af Amer: >90 mL/min (ref >90)
Glucose, Bld: 276 mg/dL — ABNORMAL HIGH (ref 70–99)
Potassium: 4.2 mEq/L (ref 3.5–5.1)

## 2012-05-26 MED ORDER — MORPHINE SULFATE 4 MG/ML IJ SOLN
2.0000 mg | Freq: Once | INTRAMUSCULAR | Status: AC
  Administered 2012-05-26: 2 mg via INTRAVENOUS
  Filled 2012-05-26: qty 1

## 2012-05-26 NOTE — ED Provider Notes (Signed)
History     CSN: [id number]  Arrival date & time 05/26/12  1108   First MD Initiated Contact with Patient 05/26/12 1128      Chief Complaint  Patient presents with  . Headache  . Blurred Vision    (Consider location/radiation/quality/duration/timing/severity/associated sxs/prior treatment) HPI Comments: Patient is a 57 year old female with a past medical history of diabetes, CAD, obesity, previous TIA who presents with a headache since 1 hour ago. Patient reports a sudden onset and progressive worsening of the headache. The pain is sharp, constant and is located in generalized head without radiation. Patient has tried nothing for symptoms without relief. No alleviating/aggravating factors. Patient reports associated left arm numbness and blurry vision. Patient denies fever, nausea, vomiting, diarrhea, weakness, congestion, chest pain, SOB, abdominal pain.      Past Medical History  Diagnosis Date  . Coronary atherosclerosis of native coronary artery     non obstructive by cath in 2008 and 2010  . Unspecified essential hypertension   . Other and unspecified hyperlipidemia   . Syncope and collapse     near-syncopal episode in November/2008  . Unspecified cardiovascular disease     Decreased left-ventricular fxn  . 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 Unspecified hereditary and idiopathic peripheral neuropathy   . Obesity, unspecified   . Esophageal reflux   . History of diabetes mellitus, type II   . Diaphragmatic hernia without mention of obstruction or gangrene   . Irritable bowel syndrome   . Morbid obesity   . Diabetes mellitus   . Cellulitis     left foot  . Anginal pain   . Stroke     2013-'no residual'  . Vertigo   . Dry skin   . Constipation     Past Surgical History  Procedure Date  . Invasive electrophysiologic study 5/09    followed by insertion of an implantable loop recorder. S/p removal   . Colonoscopy   .  Cholecystectomy   . Multiple toe surgeries   . Cardiac catheterization     LAD 30%, circumflex 50%, OM 75%, RI 60% with small branch 80%, dominant RCA 60%, EF 45-50%  . I&d extremity 12/23/2011    Procedure: IRRIGATION AND DEBRIDEMENT EXTREMITY;  Surgeon: Verlee Rossetti, MD;  Location: [facility] OR;  Service: Orthopedics;  Laterality: Left;  Left Foot  . I&d extremity 12/26/2011    Procedure: IRRIGATION AND DEBRIDEMENT EXTREMITY;  Surgeon: Toni Arthurs, MD;  Location: [facility] OR;  Service: Orthopedics;  Laterality: Left;  LEFT FOOT I&D WITH POSSIBLE WOUND VAC APPLICATION, POSSIBLE LEFT FIRST RAY AMPUTATION  . Amputation 12/30/2011    Procedure: AMPUTATION RAY;  Surgeon: Toni Arthurs, MD;  Location: [facility] OR;  Service: Orthopedics;  Laterality: Left;  LEFT FIRST RAY AMPUTATION  . Eye surgery     cataract  . Amputation 01/13/2012    Procedure: AMPUTATION BELOW KNEE;  Surgeon: Toni Arthurs, MD;  Location: [facility] OR;  Service: Orthopedics;  Laterality: Left;  . Amputation 03/04/2012    Procedure: AMPUTATION BELOW KNEE;  Surgeon: Toni Arthurs, MD;  Location: [facility] OR;  Service: Orthopedics;  Laterality: Left;  Revision of Left Below Knee Amputation    Family History  Problem Relation Age of Onset  . Pneumonia Mother   . Gallbladder disease Mother     cancer  . Heart failure Mother   . Diabetes Father   . Coronary artery disease Father   . Stroke Neg  Hx     History  Substance Use Topics  . Smoking status: Current Every Day Smoker -- 0.2 packs/day for 40 years    Types: Cigarettes  . Smokeless tobacco: Never Used  . 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    OB History    Grav Para Term Preterm Abortions TAB SAB Ect Mult Living                  Review of Systems  Eyes: Positive for visual disturbance.  Neurological: Positive for numbness and headaches.  All other systems reviewed and are negative.    Allergies    Banana; Penicillins; and Strawberry  Home Medications   Current Outpatient Rx  Name  Route  Sig  Dispense  Refill  . CIPROFLOXACIN HCL 250 MG PO TABS   Oral   Take 250 mg by mouth 2 (two) times daily.         Marland Kitchen CLINDAMYCIN HCL 300 MG PO CAPS   Oral   Take 600 mg by mouth 3 (three) times daily.         Marland Kitchen CLOPIDOGREL BISULFATE 75 MG PO TABS   Oral   Take 75 mg by mouth daily.         Marland Kitchen GLIMEPIRIDE 4 MG PO TABS   Oral   Take 4 mg by mouth daily.          Marland Kitchen HYDROCHLOROTHIAZIDE 25 MG PO TABS   Oral   Take 25 mg by mouth daily.         . INSULIN ASPART 100 UNIT/ML [place] SOLN   Subcutaneous   Inject 6 Units into the skin 3 (three) times daily before meals.          . INSULIN GLARGINE 100 UNIT/ML  SOLN   Subcutaneous   Inject 20 Units into the skin at bedtime.         Marland Kitchen POLYSACCHARIDE IRON COMPLEX 150 MG PO CAPS   Oral   Take 150 mg by mouth 2 (two) times daily.         Marland Kitchen LISINOPRIL 20 MG PO TABS   Oral   Take 20 mg by mouth daily.         Marland Kitchen MECLIZINE HCL 25 MG PO TABS   Oral   Take 25 mg by mouth 3 (three) times daily as needed. dizziness         . METFORMIN HCL 500 MG PO TABS   Oral   Take 250 mg by mouth 2 (two) times daily with a meal.         . METHOCARBAMOL 500 MG PO TABS   Oral   Take 500 mg by mouth 2 (two) times daily as needed. For muscle spasms         . MORPHINE SULFATE 15 MG PO TABS   Oral   Take 15 mg by mouth every 4 (four) hours as needed. For pain         . SENNOSIDES 8.6 MG PO TABS   Oral   Take 2 tablets by mouth 2 (two) times daily as needed. For constipation         . SIMVASTATIN 40 MG PO TABS   Oral   Take 40 mg by mouth every evening.         Marland Kitchen MORPHINE SULFATE 15 MG PO  TABS   Oral   Take 1-2 tablets (15-30 mg total) by mouth every 4 (four) hours as needed.   40 tablet   0     BP 167/73  Pulse 81  Temp 98.7 F (37.1 C) (Oral)  Resp 18  SpO2 100%  Physical Exam  Nursing note and vitals  reviewed. Constitutional: She is oriented to person, place, and time. She appears well-developed and well-nourished. No distress.  HENT:  Head: Normocephalic and atraumatic.  Mouth/Throat: Oropharynx is clear and moist. No oropharyngeal exudate.       Left face diminished sensation compared to right side.   Eyes: Conjunctivae normal and EOM are normal. Pupils are equal, round, and reactive to light. No scleral icterus.  Neck: Normal range of motion. Neck supple.  Cardiovascular: Normal rate and regular rhythm.  Exam reveals no gallop and no friction rub.   No murmur heard. Pulmonary/Chest: Effort normal and breath sounds normal. She has no wheezes. She has no rales. She exhibits no tenderness.  Abdominal: Soft. She exhibits no distension. There is no tenderness. There is no rebound and no guarding.  Musculoskeletal: Normal range of motion.  Neurological: She is alert and oriented to person, place, and time. A cranial nerve deficit is present. Coordination normal.       Patient reports diminished left facial sensation, left arm sensation. Cerebellar testing done without difficulty. Strength equal bilaterally. Speech is goal-oriented. Moves limbs without ataxia.   Skin: Skin is warm and dry.  Psychiatric: She has a normal mood and affect. Her behavior is normal.    ED Course  Procedures (including critical care time)   Date: 05/26/2012  Rate: 82  Rhythm: normal sinus rhythm  QRS Axis: normal  Intervals: normal  ST/T Wave abnormalities: nonspecific T wave changes  Conduction Disutrbances:none  Narrative Interpretation: NSR with nonspecific T wave abnormalities unchanged from October 2013  Old EKG Reviewed: unchanged    Labs Reviewed  GLUCOSE, CAPILLARY - Abnormal; Notable for the following:    Glucose-Capillary 283 (*)     All other components within normal limits  CBC WITH DIFFERENTIAL - Abnormal; Notable for the following:    RDW 16.1 (*)     All other components within normal  limits  BASIC METABOLIC PANEL - Abnormal; Notable for the following:    Glucose, Bld 276 (*)     All other components within normal limits   Ct Head Wo Contrast  05/26/2012  *RADIOLOGY REPORT*  Clinical Data: Sudden onset severe headache with blurred vision and left arm numbness/tingling.  CT HEAD WITHOUT CONTRAST  Technique:  Contiguous axial images were obtained from the base of the skull through the vertex without contrast.  Comparison: MRA brain 01/07/2012 and CT head 12/23/2011.  Findings: No evidence of acute infarct, acute hemorrhage, mass lesion, mass effect or hydrocephalus.  Mild atrophy.  Visualized portions of the paranasal sinuses and mastoid air cells are clear.  IMPRESSION:  1.  No acute intracranial abnormality. 2.  Mild atrophy.   Original Report Authenticated By: Leanna Battles, M.D.      1. Headache       MDM  12:23 PM Labs pending. Head CT pending. Patient will have morphine for pain.   2:44 PM Head CT unremarkable. Patient feeling better. Patient will be discharged without further evaluation. Patient discussed with Dr. Bernette Mayers who agrees with plan. Patient instructed to follow up with her PCP for further evaluation and management. No further evaluation needed at this time. Vitals stable for  discharge.       Emilia Beck, PA-C 05/26/12 1452

## 2012-05-26 NOTE — ED Notes (Signed)
Attempted to gain IV access, unsuccessful. Another RN will try.

## 2012-05-26 NOTE — ED Notes (Signed)
Pt undressed, in gown, on monitor, continuous pulse oximetry and blood pressure cuff 

## 2012-05-26 NOTE — ED Provider Notes (Signed)
Medical screening examination/treatment/procedure(s) were performed by non-physician practitioner and as supervising physician I was immediately available for consultation/collaboration.   Bard Haupert B. Bernette Mayers, MD 05/26/12 1455

## 2012-05-26 NOTE — ED Notes (Signed)
Pt on phone laughing

## 2012-05-26 NOTE — ED Notes (Addendum)
Per EMS - pt was at work when she suddenly got a severe HA along with blurred vision and left arm numbness/tingling. Pt has hx of TIA. Pt reports she is still having a headache but the arm numbness went away but is now back. BP 170/97 CBG 286 HR 80 nsr. 12 lead unremarkable. Pt a&ox4. 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

## 2012-06-07 ENCOUNTER — Emergency Department (HOSPITAL_COMMUNITY): Payer: Managed Care, Other (non HMO)

## 2012-06-07 ENCOUNTER — Encounter (HOSPITAL_COMMUNITY): Payer: Self-pay | Admitting: Emergency Medicine

## 2012-06-07 ENCOUNTER — Emergency Department (HOSPITAL_COMMUNITY)
Admission: EM | Admit: 2012-06-07 | Discharge: 2012-06-07 | Disposition: A | Discharge status: Home / Self Care | Payer: Managed Care, Other (non HMO) | Attending: Emergency Medicine | Admitting: Emergency Medicine

## 2012-06-07 DIAGNOSIS — Z7902 Long term (current) use of antithrombotics/antiplatelets: Secondary | ICD-10-CM | POA: Insufficient documentation

## 2012-06-07 DIAGNOSIS — K219 Gastro-esophageal reflux disease without esophagitis: Secondary | ICD-10-CM | POA: Insufficient documentation

## 2012-06-07 DIAGNOSIS — R072 Precordial pain: Secondary | ICD-10-CM

## 2012-06-07 DIAGNOSIS — Z8719 Personal history of other diseases of the digestive system: Secondary | ICD-10-CM | POA: Insufficient documentation

## 2012-06-07 DIAGNOSIS — Z794 Long term (current) use of insulin: Secondary | ICD-10-CM | POA: Insufficient documentation

## 2012-06-07 DIAGNOSIS — R110 Nausea: Secondary | ICD-10-CM | POA: Insufficient documentation

## 2012-06-07 DIAGNOSIS — R071 Chest pain on breathing: Secondary | ICD-10-CM | POA: Insufficient documentation

## 2012-06-07 DIAGNOSIS — E119 Type 2 diabetes mellitus without complications: Secondary | ICD-10-CM | POA: Insufficient documentation

## 2012-06-07 DIAGNOSIS — Z8673 Personal history of transient ischemic attack (TIA), and cerebral infarction without residual deficits: Secondary | ICD-10-CM | POA: Insufficient documentation

## 2012-06-07 DIAGNOSIS — F17200 Nicotine dependence, unspecified, uncomplicated: Secondary | ICD-10-CM | POA: Insufficient documentation

## 2012-06-07 DIAGNOSIS — I10 Essential (primary) hypertension: Secondary | ICD-10-CM | POA: Insufficient documentation

## 2012-06-07 DIAGNOSIS — Z79899 Other long term (current) drug therapy: Secondary | ICD-10-CM | POA: Insufficient documentation

## 2012-06-07 DIAGNOSIS — Z8669 Personal history of other diseases of the nervous system and sense organs: Secondary | ICD-10-CM | POA: Insufficient documentation

## 2012-06-07 DIAGNOSIS — R0789 Other chest pain: Secondary | ICD-10-CM

## 2012-06-07 DIAGNOSIS — E785 Hyperlipidemia, unspecified: Secondary | ICD-10-CM | POA: Insufficient documentation

## 2012-06-07 DIAGNOSIS — G608 Other hereditary and idiopathic neuropathies: Secondary | ICD-10-CM | POA: Insufficient documentation

## 2012-06-07 HISTORY — DX: Polyneuropathy, unspecified: G62.9

## 2012-06-07 HISTORY — DX: Tobacco use: Z72.0

## 2012-06-07 HISTORY — DX: Hyperlipidemia, unspecified: E78.5

## 2012-06-07 HISTORY — DX: Type 2 diabetes mellitus without complications: E11.9

## 2012-06-07 HISTORY — DX: Other chest pain: R07.89

## 2012-06-07 HISTORY — DX: Essential (primary) hypertension: I10

## 2012-06-07 LAB — CBC WITH DIFFERENTIAL/PLATELET
Basophils Absolute: 0 10*3/uL (ref 0.0–0.1)
Lymphocytes Relative: 22 % (ref 12–46)
Lymphs Abs: 2.3 10*3/uL (ref 0.7–4.0)
MCV: 81.5 fL (ref 78.0–100.0)
Neutro Abs: 7.4 10*3/uL (ref 1.7–7.7)
Neutrophils Relative %: 71 % (ref 43–77)
Platelets: 259 10*3/uL (ref 150–400)
RBC: 4.7 MIL/uL (ref 3.87–5.11)
RDW: 15.9 % — ABNORMAL HIGH (ref 11.5–15.5)
WBC: 10.5 10*3/uL (ref 4.0–10.5)

## 2012-06-07 LAB — URINALYSIS, ROUTINE W REFLEX MICROSCOPIC
Bilirubin Urine: NEGATIVE
Hgb urine dipstick: NEGATIVE
Ketones, ur: NEGATIVE mg/dL
Nitrite: NEGATIVE
Specific Gravity, Urine: 1.01 (ref 1.005–1.030)
Urobilinogen, UA: 0.2 mg/dL (ref 0.0–1.0)
pH: 6 (ref 5.0–8.0)

## 2012-06-07 LAB — TROPONIN I: Troponin I: <0.3 ng/mL (ref <0.30)

## 2012-06-07 LAB — COMPREHENSIVE METABOLIC PANEL
ALT: 22 U/L (ref 0–35)
AST: 15 U/L (ref 0–37)
Alkaline Phosphatase: 159 U/L — ABNORMAL HIGH (ref 39–117)
CO2: 25 mEq/L (ref 19–32)
Chloride: 100 mEq/L (ref 96–112)
GFR calc Af Amer: >90 mL/min (ref >90)
GFR calc non Af Amer: 78 mL/min — ABNORMAL LOW (ref >90)
Glucose, Bld: 170 mg/dL — ABNORMAL HIGH (ref 70–99)
Potassium: 4.1 mEq/L (ref 3.5–5.1)
Sodium: 138 mEq/L (ref 135–145)

## 2012-06-07 MED ORDER — SODIUM CHLORIDE 0.9 % IV BOLUS (SEPSIS)
500.0000 mL | Freq: Once | INTRAVENOUS | Status: AC
  Administered 2012-06-07: 500 mL via INTRAVENOUS

## 2012-06-07 MED ORDER — ASPIRIN 81 MG PO CHEW
324.0000 mg | CHEWABLE_TABLET | Freq: Once | ORAL | Status: DC
  Filled 2012-06-07: qty 4

## 2012-06-07 MED ORDER — IBUPROFEN 400 MG PO TABS: 400.0000 mg | ORAL_TABLET | Freq: Four times a day (QID) | ORAL | Status: DC | PRN

## 2012-06-07 MED ORDER — HYDROCODONE-ACETAMINOPHEN 5-325 MG PO TABS: 1.0000 | ORAL_TABLET | ORAL | Status: DC | PRN

## 2012-06-07 MED ORDER — IOHEXOL 300 MG/ML  SOLN: 100.0000 mL | Freq: Once | INTRAMUSCULAR | Status: DC | PRN

## 2012-06-07 MED ORDER — IOHEXOL 350 MG/ML SOLN
100.0000 mL | Freq: Once | INTRAVENOUS | Status: AC | PRN
  Administered 2012-06-07: 100 mL via INTRAVENOUS

## 2012-06-07 MED ORDER — MORPHINE SULFATE 4 MG/ML IJ SOLN
4.0000 mg | Freq: Once | INTRAMUSCULAR | Status: AC
  Administered 2012-06-07: 4 mg via INTRAVENOUS
  Filled 2012-06-07: qty 1

## 2012-06-07 NOTE — Consult Note (Signed)
Patient ID: [name] MRN: [id number], DOB/AGE: [birth year]   Admit date: 06/07/2012   Primary Physician: [name], MD Primary Cardiologist: J. Allred, MD  Pt. Profile:  57 y/o female with h/o atypical chest pain and nonobstructive CAD who presented to the ED this AM with chest pain.  Problem List  Past Medical History  Diagnosis Date  . Atypical chest pain     a. non obstructive by cath in 2008 and 2010;  b. 02/2012 Myoview: non-ischemic, EF 57%  . HTN (hypertension)   . Hyperlipidemia   . Syncope and collapse     a. near-syncopal episode in November 2008;  b. 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  . 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 Peripheral neuropathy   . Obesity, unspecified   . Esophageal reflux   . Diabetes mellitus, type II   . Diaphragmatic hernia without mention of obstruction or gangrene   . Irritable bowel syndrome   . Morbid obesity   . Cellulitis     a. 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 Stroke     2013-'no residual'  . Vertigo   . Dry skin   . Constipation   . Tobacco abuse     Past Surgical History  Procedure Date  . Invasive electrophysiologic study 5/09    followed by insertion of an implantable loop recorder. S/p removal   . Colonoscopy   . Cholecystectomy   . Multiple toe surgeries   . Cardiac catheterization     LAD 30%, circumflex 50%, OM 75%, RI 60% with small branch 80%, dominant RCA 60%, EF 45-50%  . I&d extremity 12/23/2011    Procedure: IRRIGATION AND DEBRIDEMENT EXTREMITY;  Surgeon: Verlee Rossetti, MD;  Location: [facility] OR;  Service: Orthopedics;  Laterality: Left;  Left Foot  . I&d extremity 12/26/2011    Procedure: IRRIGATION AND DEBRIDEMENT EXTREMITY;  Surgeon: Toni Arthurs, MD;  Location: [facility] OR;  Service: Orthopedics;  Laterality: Left;  LEFT FOOT I&D WITH POSSIBLE WOUND VAC APPLICATION, POSSIBLE LEFT FIRST RAY AMPUTATION  . Amputation 12/30/2011    Procedure: AMPUTATION RAY;  Surgeon: Toni Arthurs, MD;  Location: [facility] OR;  Service: Orthopedics;  Laterality: Left;  LEFT FIRST RAY AMPUTATION  . Eye surgery     cataract  . Amputation 01/13/2012    Procedure: AMPUTATION BELOW KNEE;  Surgeon: Toni Arthurs, MD;  Location: [facility] OR;  Service: Orthopedics;  Laterality: Left;  . Amputation 03/04/2012    Procedure: AMPUTATION BELOW KNEE;  Surgeon: Toni Arthurs, MD;  Location: [facility] OR;  Service: Orthopedics;  Laterality: Left;  Revision of Left Below Knee Amputation    Allergies  Allergies  Allergen Reactions  . Banana Other (See Comments)    Facial swelling  . Penicillins Itching     edema  . Strawberry Other (See Comments)    Facial swelling   HPI  57 y/o female with h/o atypical chest pain s/p 2 prior non-obs caths and a negative myoview in 02/2013.  She was in her USOH until this AM when she awoke between 2:30 and 3:00 AM with the sensation that "an elephant was on [her] chest."  This was associated with mild dyspnea and persisted the remainder of the AM.  She says that she slept restlessly following onset and at 5:00 AM, called EMS.  She was taken to the [facility] ED where her pain persists at a 6/10.  It is worse with deep breathing, palpation, and position  changes.  ECG shows new lateral q's and inf st changes.  Troponin is normal.  Home Medications  Prior to Admission medications   Medication Sig Start Date End Date Taking? Authorizing Provider  ciprofloxacin (CIPRO) 250 MG tablet Take 250 mg by mouth 2 (two) times daily.   Yes Historical Provider, MD  clindamycin (CLEOCIN) 300 MG capsule Take 600 mg by mouth 3 (three) times daily.   Yes Historical Provider, MD  clopidogrel (PLAVIX) 75 MG tablet Take 75 mg by mouth daily.   Yes Historical Provider, MD  glimepiride (AMARYL) 4 MG tablet Take 4 mg by mouth daily.    Yes Historical Provider, MD  hydrochlorothiazide (HYDRODIURIL) 25 MG tablet Take 25 mg by mouth daily.   Yes Historical Provider, MD  insulin aspart (NOVOLOG) 100 UNIT/ML  injection Inject 6 Units into the skin 3 (three) times daily before meals.    Yes Historical Provider, MD  insulin glargine (LANTUS) 100 UNIT/ML injection Inject 20 Units into the skin at bedtime.   Yes Historical Provider, MD  iron polysaccharides (NIFEREX) 150 MG capsule Take 150 mg by mouth 2 (two) times daily.   Yes Historical Provider, MD  lisinopril (PRINIVIL,ZESTRIL) 20 MG tablet Take 20 mg by mouth daily.   Yes Historical Provider, MD  meclizine (ANTIVERT) 25 MG tablet Take 25 mg by mouth 3 (three) times daily as needed. dizziness   Yes Historical Provider, MD  metFORMIN (GLUCOPHAGE) 500 MG tablet Take 250 mg by mouth 2 (two) times daily with a meal.   Yes Historical Provider, MD  methocarbamol (ROBAXIN) 500 MG tablet Take 500 mg by mouth 2 (two) times daily as needed. For muscle spasms   Yes Historical Provider, MD  morphine (MSIR) 15 MG tablet Take 15 mg by mouth every 4 (four) hours as needed. For pain   Yes Historical Provider, MD  simvastatin (ZOCOR) 40 MG tablet Take 40 mg by mouth every evening.   Yes Historical Provider, MD   Family History  Family History  Problem Relation Age of Onset  . Pneumonia Mother   . Gallbladder disease Mother     cancer  . Heart failure Mother   . Diabetes Father   . Coronary artery disease Father   . Stroke Neg Hx    Social History  History   Social History  . Marital Status: Single    Spouse Name: N/A    Number of Children: N/A  . Years of Education: N/A   Occupational History  . Document [employer] company   Social History Main Topics  . Smoking status: Current Every Day Smoker -- 0.2 packs/day for 40 years    Types: Cigarettes  . Smokeless tobacco: Never Used  . 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  . Drug Use: No  . Sexually Active: Not Currently    Birth Control/ Protection: Post-menopausal   Other Topics Concern  .  Not on file   Social History Narrative   She drinks beer. Several on sundays. Does not routinely exercise. 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     Review of Systems General:  No chills, fever, night sweats or weight changes.  Cardiovascular:  +++ chest pain.  No dyspnea on exertion, edema, orthopnea, palpitations, paroxysmal nocturnal dyspnea. Dermatological: No rash, lesions/masses Respiratory: No cough, dyspnea Urologic: No hematuria,  dysuria Abdominal:   No nausea, vomiting, diarrhea, bright red blood per rectum, melena, or hematemesis Neurologic:  No visual changes, wkns, changes in mental status. All other systems reviewed and are otherwise negative except as noted above.  Physical Exam  Blood pressure 140/62, pulse 97, temperature 97.5 F (36.4 C), temperature source Oral, resp. rate 14, height 5\' 6"  (1.676 m), weight 212 lb (96.163 kg), SpO2 100.00%.  General: Pleasant, NAD Psych: Normal affect. Neuro: Alert and oriented X 3. Moves all extremities spontaneously. HEENT: Normal  Neck: Supple without bruits or JVD. Lungs:  Resp regular and unlabored, CTA. Heart: RRR no s3, s4, or murmurs. Abdomen: Soft, non-tender, non-distended, BS + x 4.  Extremities: No clubbing, cyanosis or edema. DP/PT/Radials 2+ and equal bilaterally.  L BKA.  Labs   [facility] 06/07/12 0755  CKTOTAL --  CKMB --  TROPONINI <0.30   Lab Results  Component Value Date   WBC 10.5 06/07/2012   HGB 12.6 06/07/2012   HCT 38.3 06/07/2012   MCV 81.5 06/07/2012   PLT 259 06/07/2012     Lab 06/07/12 0754  NA 138  K 4.1  CL 100  CO2 25  BUN 19  CREATININE 0.82  CALCIUM 9.2  PROT 7.0  BILITOT 0.2*  ALKPHOS 159*  ALT 22  AST 15  GLUCOSE 170*   Radiology/Studies  Dg Chest 2 View  06/07/2012  *RADIOLOGY REPORT*  Clinical Data: Chest pain and dizziness.  CHEST - 2 VIEW  Comparison: 02/09/2012.  Findings: Trachea is midline.  Heart size stable.  Lungs are clear. No pleural  fluid.  Degenerative changes are seen in the spine.  IMPRESSION: No acute findings.   Original Report Authenticated By: Leanna Battles, M.D.    Ct Angio Chest W/cm &/or Wo Cm  06/07/2012  *RADIOLOGY REPORT*  Clinical Data: Severe chest pain.  CT ANGIOGRAPHY CHEST  Technique:  Multidetector CT imaging of the chest using the standard protocol during bolus administration of intravenous contrast. Multiplanar reconstructed images including MIPs were obtained and reviewed to evaluate the vascular anatomy.  Contrast: OMNIPAQUE IOHEXOL 350 MG/ML SOLN  Comparison: Chest radiograph 06/08/2011 and CT chest 01/08/2012.  Findings:  IMPRESSION:  1.  Negative for pulmonary embolus. 2.  Coronary artery calcification.   Original Report Authenticated By: Leanna Battles, M.D.    ECG  Rsr, 97, lat q's - new, st dep/twi III, aVF - repeat pending.  ASSESSMENT AND PLAN   Signed, [name], NP 06/07/2012, 2:09 PM As above, patient seen and examined. Briefly patient is a 57 year old female with past medical history of diabetes mellitus, hypertension, hyperlipidemia, prior CVA, and history of atypical chest pain who we are asked to evaluate for chest pain. She has had 2 previous catheterizations in 2008 and 2010 as outlined above and medical therapy recommended. Myoview in October of 2013 showed normal LV function and normal perfusion. Patient awoke today at 3:30 AM with complaints of pain in the left upper chest area. It was described as a throbbing pain. It did not radiate. There was associated nausea and diaphoresis but no dyspnea. The pain increased with certain movements and inspiration. It has been continuous since that time it resolved about one half hour prior to being seen by me. CT angiogram was negative for pulmonary embolus. There is note of coronary calcification. Initial troponin is negative. Electrocardiogram shows sinus rhythm at a rate of 97. Prior lateral infarct and inferior T-wave inversion.  Patient symptoms are extremely atypical. They are reproduced with palpation.  It has now been close to 12 hours since symptom onset. Initial troponin was negative. We'll plan to repeat troponin now. If negative she has essentially ruled out. She had a recent negative functional study. A followup troponin negative I do not think she needs further ischemia evaluation. Continue aspirin and statin. Followup Dr. Johney Frame. Olga Millers  2:51 PM

## 2012-06-07 NOTE — ED Notes (Signed)
EDP at bedside  

## 2012-06-07 NOTE — ED Provider Notes (Signed)
History     CSN: [id number]  Arrival date & time 06/07/12  4098   First MD Initiated Contact with Patient 06/07/12 [phone]      Chief Complaint  Patient presents with  . Chest Pain    (Consider location/radiation/quality/duration/timing/severity/associated sxs/prior treatment) HPI Pt with history of non-obstructive CAD by cath 2010 followed by [facility] cardiology with several presentation to ED for chest pain presents with Left lateral chest pain waking her from her sleep at 0300. Pain is constant, worse with palpation and movement. No recent strenuous activity. +nausea but no SOB. Pain radiates to L shoulder and arm. 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   Past Medical History  Diagnosis Date  . Atypical chest pain     a. non obstructive by cath in 2008 and 2010;  b. 02/2012 Myoview: non-ischemic, EF 57%  . HTN (hypertension)   . Hyperlipidemia   . Syncope and collapse     a. near-syncopal episode in November 2008;  b. 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  . 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 Peripheral neuropathy   . Obesity, unspecified   . Esophageal reflux   . Diabetes mellitus, type II   . Diaphragmatic hernia without mention of obstruction or gangrene   . Irritable bowel syndrome   . Morbid obesity   . Cellulitis     a. 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 Stroke     2013-'no residual'  . Vertigo   . Dry skin   . Constipation   . Tobacco abuse     Past Surgical History  Procedure Date  . Invasive electrophysiologic study 5/09    followed by insertion of an implantable loop recorder. S/p removal   . Colonoscopy   . Cholecystectomy   . Multiple toe surgeries   . Cardiac catheterization     LAD 30%, circumflex 50%, OM 75%, RI 60% with small branch 80%, dominant RCA 60%, EF 45-50%  . I&d extremity 12/23/2011    Procedure: IRRIGATION AND DEBRIDEMENT  EXTREMITY;  Surgeon: Verlee Rossetti, MD;  Location: [facility] OR;  Service: Orthopedics;  Laterality: Left;  Left Foot  . I&d extremity 12/26/2011    Procedure: IRRIGATION AND DEBRIDEMENT EXTREMITY;  Surgeon: Toni Arthurs, MD;  Location: [facility] OR;  Service: Orthopedics;  Laterality: Left;  LEFT FOOT I&D WITH POSSIBLE WOUND VAC APPLICATION, POSSIBLE LEFT FIRST RAY AMPUTATION  . Amputation 12/30/2011    Procedure: AMPUTATION RAY;  Surgeon: Toni Arthurs, MD;  Location: [facility] OR;  Service: Orthopedics;  Laterality: Left;  LEFT FIRST RAY AMPUTATION  . Eye surgery     cataract  . Amputation 01/13/2012    Procedure: AMPUTATION BELOW KNEE;  Surgeon: Toni Arthurs, MD;  Location: [facility] OR;  Service: Orthopedics;  Laterality: Left;  . Amputation 03/04/2012    Procedure: AMPUTATION BELOW KNEE;  Surgeon: Toni Arthurs, MD;  Location: [facility] OR;  Service: Orthopedics;  Laterality: Left;  Revision of Left Below Knee Amputation    Family History  Problem Relation Age of Onset  . Pneumonia Mother   . Gallbladder disease Mother     cancer  . Heart failure Mother   . Diabetes Father   . Coronary artery disease Father   . Stroke Neg Hx     History  Substance Use Topics  . Smoking status: Current Every Day Smoker -- 0.2 packs/day for  40 years    Types: Cigarettes  . Smokeless tobacco: Never Used  . 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    OB History    Grav Para Term Preterm Abortions TAB SAB Ect Mult Living                  Review of Systems  Constitutional: Negative for fever and chills.  Respiratory: Negative for cough, shortness of breath and wheezing.   Cardiovascular: Positive for chest pain. Negative for palpitations and leg swelling.  Gastrointestinal: Positive for nausea. Negative for vomiting, abdominal pain and constipation.  Musculoskeletal: Negative for back pain.  Skin: Negative for pallor, rash and wound.    Neurological: Negative for dizziness, weakness, light-headedness, numbness and headaches.    Allergies  Banana; Penicillins; and Strawberry  Home Medications   Current Outpatient Rx  Name  Route  Sig  Dispense  Refill  . CIPROFLOXACIN HCL 250 MG PO TABS   Oral   Take 250 mg by mouth 2 (two) times daily.         Marland Kitchen CLINDAMYCIN HCL 300 MG PO CAPS   Oral   Take 600 mg by mouth 3 (three) times daily.         Marland Kitchen CLOPIDOGREL BISULFATE 75 MG PO TABS   Oral   Take 75 mg by mouth daily.         Marland Kitchen GLIMEPIRIDE 4 MG PO TABS   Oral   Take 4 mg by mouth daily.          Marland Kitchen HYDROCHLOROTHIAZIDE 25 MG PO TABS   Oral   Take 25 mg by mouth daily.         . INSULIN ASPART 100 UNIT/ML [place] SOLN   Subcutaneous   Inject 6 Units into the skin 3 (three) times daily before meals.          . INSULIN GLARGINE 100 UNIT/ML [place] SOLN   Subcutaneous   Inject 20 Units into the skin at bedtime.         Marland Kitchen POLYSACCHARIDE IRON COMPLEX 150 MG PO CAPS   Oral   Take 150 mg by mouth 2 (two) times daily.         Marland Kitchen LISINOPRIL 20 MG PO TABS   Oral   Take 20 mg by mouth daily.         Marland Kitchen MECLIZINE HCL 25 MG PO TABS   Oral   Take 25 mg by mouth 3 (three) times daily as needed. dizziness         . METFORMIN HCL 500 MG PO TABS   Oral   Take 250 mg by mouth 2 (two) times daily with a meal.         . METHOCARBAMOL 500 MG PO TABS   Oral   Take 500 mg by mouth 2 (two) times daily as needed. For muscle spasms         . MORPHINE SULFATE 15 MG PO TABS   Oral   Take 15 mg by mouth every 4 (four) hours as needed. For pain         . SIMVASTATIN 40 MG PO TABS   Oral   Take 40 mg by mouth every evening.         Marland Kitchen HYDROCODONE-ACETAMINOPHEN 5-325 MG PO TABS   Oral   Take 1 tablet by  mouth every 4 (four) hours as needed for pain.   10 tablet   0   . IBUPROFEN 400 MG PO TABS   Oral   Take 1 tablet (400 mg total) by mouth every 6 (six) hours as needed for pain.   30 tablet   0      BP 144/64  Pulse 83  Temp 97.5 F (36.4 C) (Oral)  Resp 14  Ht 5\' 6"  (1.676 m)  Wt 212 lb (96.163 kg)  BMI 34.22 kg/m2  SpO2 100%  Physical Exam  Nursing note and vitals reviewed. Constitutional: She is oriented to person, place, and time. She appears well-developed and well-nourished. No distress.  HENT:  Head: Normocephalic and atraumatic.  Mouth/Throat: Oropharynx is clear and moist.  Eyes: EOM are normal. Pupils are equal, round, and reactive to light.  Neck: Normal range of motion. Neck supple.  Cardiovascular: Normal rate and regular rhythm.   Pulmonary/Chest: Effort normal and breath sounds normal. No respiratory distress. She has no wheezes. She has no rales. She exhibits tenderness (Lateral left chest wall TTP. Pain reproduced completely. no crepitance. ).  Abdominal: Soft. Bowel sounds are normal. She exhibits no distension and no mass. There is no tenderness. There is no rebound and no guarding.  Musculoskeletal: Normal range of motion. She exhibits tenderness (Mild TTP of LLE stump. No swelling, redness, or warmth. ). She exhibits no edema.  Neurological: She is alert and oriented to person, place, and time.       Moves all ext without deficit, sensation grossly intact.   Skin: Skin is warm and dry. No rash noted. No erythema.  Psychiatric: She has a normal mood and affect. Her behavior is normal.    ED Course  Procedures (including critical care time)  Labs Reviewed  CBC WITH DIFFERENTIAL - Abnormal; Notable for the following:    RDW 15.9 (*)     All other components within normal limits  COMPREHENSIVE METABOLIC PANEL - Abnormal; Notable for the following:    Glucose, Bld 170 (*)     Albumin 3.1 (*)     Alkaline Phosphatase 159 (*)     Total Bilirubin 0.2 (*)     GFR calc non Af Amer 78 (*)     All other components within normal limits  URINALYSIS, ROUTINE W REFLEX MICROSCOPIC - Abnormal; Notable for the following:    APPearance CLOUDY (*)     All other  components within normal limits  TROPONIN I  TROPONIN I  LAB REPORT - SCANNED   No results found.   1. Chest wall pain      Date: 06/07/2012  Rate: 97  Rhythm: normal sinus rhythm  QRS Axis: right  Intervals: normal  ST/T Wave abnormalities: nonspecific T wave changes  Conduction Disutrbances:none  Narrative Interpretation:   Old EKG Reviewed: changes noted Possible t wave inversion in inferior leads when compared to prev1/8/14   MDM   Pain is improved and continues to reproduced with palpation. Given EKG changes will discuss with [place] cardiology for disposition.  Cardiology to see in ED.   Seen in ED and felt chest pain not cardiac. Suggest repeat trop and if neg d/c home.   Loren Racer, MD 06/09/12 2232

## 2012-06-07 NOTE — ED Notes (Signed)
Patient states started having 10/10 chest pain this morning.  Patient claims center chest with no radiation.  Patient denies SOB, N/V, and diaphoresis.

## 2012-06-07 NOTE — ED Notes (Addendum)
Pt states she was given 4 baby aspirin and 1 SL Nitro by EMS.  Notified Dr. Ranae Palms.

## 2012-06-24 ENCOUNTER — Emergency Department (HOSPITAL_COMMUNITY): Payer: Managed Care, Other (non HMO)

## 2012-06-24 ENCOUNTER — Encounter (HOSPITAL_COMMUNITY): Payer: Self-pay | Admitting: Emergency Medicine

## 2012-06-24 ENCOUNTER — Inpatient Hospital Stay (HOSPITAL_COMMUNITY)
Admission: EM | Admit: 2012-06-24 | Discharge: 2012-06-26 | DRG: 066 | Disposition: A | Discharge status: Home / Self Care | Payer: Managed Care, Other (non HMO) | Attending: Internal Medicine | Admitting: Internal Medicine

## 2012-06-24 DIAGNOSIS — E1142 Type 2 diabetes mellitus with diabetic polyneuropathy: Secondary | ICD-10-CM | POA: Diagnosis present

## 2012-06-24 DIAGNOSIS — R202 Paresthesia of skin: Secondary | ICD-10-CM

## 2012-06-24 DIAGNOSIS — G459 Transient cerebral ischemic attack, unspecified: Secondary | ICD-10-CM | POA: Diagnosis present

## 2012-06-24 DIAGNOSIS — I6340 Cerebral infarction due to embolism of unspecified cerebral artery: Principal | ICD-10-CM | POA: Diagnosis present

## 2012-06-24 DIAGNOSIS — I10 Essential (primary) hypertension: Secondary | ICD-10-CM | POA: Diagnosis present

## 2012-06-24 DIAGNOSIS — Z880 Allergy status to penicillin: Secondary | ICD-10-CM

## 2012-06-24 DIAGNOSIS — Z720 Tobacco use: Secondary | ICD-10-CM | POA: Diagnosis present

## 2012-06-24 DIAGNOSIS — Z823 Family history of stroke: Secondary | ICD-10-CM

## 2012-06-24 DIAGNOSIS — Z6834 Body mass index (BMI) 34.0-34.9, adult: Secondary | ICD-10-CM

## 2012-06-24 DIAGNOSIS — F17200 Nicotine dependence, unspecified, uncomplicated: Secondary | ICD-10-CM | POA: Diagnosis present

## 2012-06-24 DIAGNOSIS — IMO0002 Reserved for concepts with insufficient information to code with codable children: Secondary | ICD-10-CM | POA: Diagnosis present

## 2012-06-24 DIAGNOSIS — M79609 Pain in unspecified limb: Secondary | ICD-10-CM

## 2012-06-24 DIAGNOSIS — K219 Gastro-esophageal reflux disease without esophagitis: Secondary | ICD-10-CM | POA: Diagnosis present

## 2012-06-24 DIAGNOSIS — Z7902 Long term (current) use of antithrombotics/antiplatelets: Secondary | ICD-10-CM

## 2012-06-24 DIAGNOSIS — E1149 Type 2 diabetes mellitus with other diabetic neurological complication: Secondary | ICD-10-CM | POA: Diagnosis present

## 2012-06-24 DIAGNOSIS — E669 Obesity, unspecified: Secondary | ICD-10-CM | POA: Diagnosis present

## 2012-06-24 DIAGNOSIS — E1165 Type 2 diabetes mellitus with hyperglycemia: Secondary | ICD-10-CM | POA: Diagnosis present

## 2012-06-24 DIAGNOSIS — R209 Unspecified disturbances of skin sensation: Secondary | ICD-10-CM

## 2012-06-24 DIAGNOSIS — Z794 Long term (current) use of insulin: Secondary | ICD-10-CM

## 2012-06-24 DIAGNOSIS — I2510 Atherosclerotic heart disease of native coronary artery without angina pectoris: Secondary | ICD-10-CM | POA: Diagnosis present

## 2012-06-24 DIAGNOSIS — I639 Cerebral infarction, unspecified: Secondary | ICD-10-CM

## 2012-06-24 DIAGNOSIS — Z8249 Family history of ischemic heart disease and other diseases of the circulatory system: Secondary | ICD-10-CM

## 2012-06-24 DIAGNOSIS — E785 Hyperlipidemia, unspecified: Secondary | ICD-10-CM | POA: Diagnosis present

## 2012-06-24 DIAGNOSIS — Z79899 Other long term (current) drug therapy: Secondary | ICD-10-CM

## 2012-06-24 DIAGNOSIS — S88119A Complete traumatic amputation at level between knee and ankle, unspecified lower leg, initial encounter: Secondary | ICD-10-CM

## 2012-06-24 DIAGNOSIS — Z8673 Personal history of transient ischemic attack (TIA), and cerebral infarction without residual deficits: Secondary | ICD-10-CM

## 2012-06-24 DIAGNOSIS — R200 Anesthesia of skin: Secondary | ICD-10-CM | POA: Diagnosis present

## 2012-06-24 DIAGNOSIS — Z9089 Acquired absence of other organs: Secondary | ICD-10-CM

## 2012-06-24 DIAGNOSIS — R55 Syncope and collapse: Secondary | ICD-10-CM | POA: Diagnosis present

## 2012-06-24 LAB — CBC
HCT: 40.9 % (ref 36.0–46.0)
Hemoglobin: 13.3 g/dL (ref 12.0–15.0)
Hemoglobin: 12.4 g/dL (ref 12.0–15.0)
MCH: 26.8 pg (ref 26.0–34.0)
MCH: 26.8 pg (ref 26.0–34.0)
MCHC: 32.5 g/dL (ref 30.0–36.0)
Platelets: 199 10*3/uL (ref 150–400)
RBC: 4.62 MIL/uL (ref 3.87–5.11)
RDW: 16 % — ABNORMAL HIGH (ref 11.5–15.5)
WBC: 8.4 10*3/uL (ref 4.0–10.5)

## 2012-06-24 LAB — BASIC METABOLIC PANEL
BUN: 17 mg/dL (ref 6–23)
Calcium: 9.4 mg/dL (ref 8.4–10.5)
Creatinine, Ser: 0.62 mg/dL (ref 0.50–1.10)
GFR calc Af Amer: >90 mL/min (ref >90)
GFR calc non Af Amer: >90 mL/min (ref >90)
Glucose, Bld: 213 mg/dL — ABNORMAL HIGH (ref 70–99)

## 2012-06-24 LAB — POCT I-STAT, CHEM 8
BUN: 18 mg/dL (ref 6–23)
Calcium, Ion: 1.08 mmol/L — ABNORMAL LOW (ref 1.12–1.23)
Chloride: 105 mEq/L (ref 96–112)
Creatinine, Ser: 1 mg/dL (ref 0.50–1.10)
Glucose, Bld: 219 mg/dL — ABNORMAL HIGH (ref 70–99)
HCT: 40 % (ref 36.0–46.0)
Potassium: 4.1 mEq/L (ref 3.5–5.1)

## 2012-06-24 LAB — CREATININE, SERUM
Creatinine, Ser: 0.67 mg/dL (ref 0.50–1.10)
GFR calc Af Amer: >90 mL/min (ref >90)
GFR calc non Af Amer: >90 mL/min (ref >90)

## 2012-06-24 LAB — GLUCOSE, CAPILLARY: Glucose-Capillary: 114 mg/dL — ABNORMAL HIGH (ref 70–99)

## 2012-06-24 MED ORDER — SIMVASTATIN 40 MG PO TABS
40.0000 mg | ORAL_TABLET | Freq: Every day | ORAL | Status: DC
  Administered 2012-06-24 – 2012-06-25 (×2): 40 mg via ORAL
  Filled 2012-06-24 (×3): qty 1

## 2012-06-24 MED ORDER — SODIUM CHLORIDE 0.9 % IV SOLN
INTRAVENOUS | Status: DC
  Administered 2012-06-24: 15:00:00 via INTRAVENOUS

## 2012-06-24 MED ORDER — ASPIRIN 81 MG PO CHEW
324.0000 mg | CHEWABLE_TABLET | Freq: Once | ORAL | Status: AC
  Administered 2012-06-24: 324 mg via ORAL
  Filled 2012-06-24: qty 4

## 2012-06-24 MED ORDER — NICOTINE 7 MG/24HR TD PT24
7.0000 mg | MEDICATED_PATCH | Freq: Every day | TRANSDERMAL | Status: DC
  Administered 2012-06-24 – 2012-06-26 (×3): 7 mg via TRANSDERMAL
  Filled 2012-06-24 (×3): qty 1

## 2012-06-24 MED ORDER — ASPIRIN 325 MG PO TABS
325.0000 mg | ORAL_TABLET | Freq: Every day | ORAL | Status: DC
  Administered 2012-06-25: 325 mg via ORAL
  Filled 2012-06-24 (×2): qty 1

## 2012-06-24 MED ORDER — HEPARIN SODIUM (PORCINE) 5000 UNIT/ML IJ SOLN
5000.0000 [IU] | Freq: Three times a day (TID) | INTRAMUSCULAR | Status: DC
  Administered 2012-06-24 – 2012-06-26 (×6): 5000 [IU] via SUBCUTANEOUS
  Filled 2012-06-24 (×8): qty 1

## 2012-06-24 MED ORDER — SODIUM CHLORIDE 0.9 % IV SOLN
INTRAVENOUS | Status: DC
  Administered 2012-06-24: 18:00:00 via INTRAVENOUS

## 2012-06-24 MED ORDER — INSULIN ASPART 100 UNIT/ML ~~LOC~~ SOLN
6.0000 [IU] | Freq: Three times a day (TID) | SUBCUTANEOUS | Status: DC
  Administered 2012-06-24: 6 [IU] via SUBCUTANEOUS

## 2012-06-24 MED ORDER — INSULIN GLARGINE 100 UNIT/ML ~~LOC~~ SOLN
10.0000 [IU] | Freq: Every day | SUBCUTANEOUS | Status: DC
  Administered 2012-06-25: 10 [IU] via SUBCUTANEOUS

## 2012-06-24 MED ORDER — INSULIN GLARGINE 100 UNIT/ML ~~LOC~~ SOLN: 20.0000 [IU] | Freq: Every day | SUBCUTANEOUS | Status: DC

## 2012-06-24 MED ORDER — CLOPIDOGREL BISULFATE 75 MG PO TABS
75.0000 mg | ORAL_TABLET | Freq: Every day | ORAL | Status: DC
  Administered 2012-06-25: 75 mg via ORAL
  Filled 2012-06-24: qty 1

## 2012-06-24 MED ORDER — HYDROCODONE-ACETAMINOPHEN 5-325 MG PO TABS
1.0000 | ORAL_TABLET | Freq: Three times a day (TID) | ORAL | Status: DC | PRN
  Administered 2012-06-24 – 2012-06-25 (×3): 1 via ORAL
  Filled 2012-06-24 (×3): qty 1

## 2012-06-24 NOTE — ED Notes (Signed)
Pt states that she still has numbness in left arm. Pt alert and mentating appropriately.

## 2012-06-24 NOTE — ED Provider Notes (Addendum)
History     CSN: [id number]  Arrival date & time 06/24/12  1245   First MD Initiated Contact with Patient 06/24/12 1255      Chief Complaint  Patient presents with  . Code Stroke    (Consider location/radiation/quality/duration/timing/severity/associated sxs/prior treatment) The history is provided by the patient.  pt c/o left arm and leg, dull throbbing pain and numbness?weakness earlier today. Also notes gradual onset mild dull frontal headache. States bil vision diffusely blurry -improving. No amaurosis or visual field cut. Hx htn and diabetes. When symptoms present, denies specific exacerbating or alleviating symptoms. States symptoms improved since onset. Denies neck or radicular pain. No cp or sob. No abd pain. No nvd. No gu c/o. No fever or chills.      Past Medical History  Diagnosis Date  . Atypical chest pain     a. non obstructive by cath in 2008 and 2010;  b. 02/2012 Myoview: non-ischemic, EF 57%  . HTN (hypertension)   . Hyperlipidemia   . Syncope and collapse     a. near-syncopal episode in November 2008;  b. 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  . 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 Peripheral neuropathy   . Obesity, unspecified   . Esophageal reflux   . Diabetes mellitus, type II   . Diaphragmatic hernia without mention of obstruction or gangrene   . Irritable bowel syndrome   . Morbid obesity   . Cellulitis     a. 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 Stroke     2013-'no residual'  . Vertigo   . Dry skin   . Constipation   . Tobacco abuse     Past Surgical History  Procedure Date  . Invasive electrophysiologic study 5/09    followed by insertion of an implantable loop recorder. S/p removal   . Colonoscopy   . Cholecystectomy   . Multiple toe surgeries   . Cardiac catheterization     LAD 30%, circumflex 50%, OM 75%, RI 60% with small branch 80%, dominant RCA 60%, EF 45-50%  . I&d extremity 12/23/2011     Procedure: IRRIGATION AND DEBRIDEMENT EXTREMITY;  Surgeon: Verlee Rossetti, MD;  Location: [facility] OR;  Service: Orthopedics;  Laterality: Left;  Left Foot  . I&d extremity 12/26/2011    Procedure: IRRIGATION AND DEBRIDEMENT EXTREMITY;  Surgeon: Toni Arthurs, MD;  Location: [facility] OR;  Service: Orthopedics;  Laterality: Left;  LEFT FOOT I&D WITH POSSIBLE WOUND VAC APPLICATION, POSSIBLE LEFT FIRST RAY AMPUTATION  . Amputation 12/30/2011    Procedure: AMPUTATION RAY;  Surgeon: Toni Arthurs, MD;  Location: [facility] OR;  Service: Orthopedics;  Laterality: Left;  LEFT FIRST RAY AMPUTATION  . Eye surgery     cataract  . Amputation 01/13/2012    Procedure: AMPUTATION BELOW KNEE;  Surgeon: Toni Arthurs, MD;  Location: [facility] OR;  Service: Orthopedics;  Laterality: Left;  . Amputation 03/04/2012    Procedure: AMPUTATION BELOW KNEE;  Surgeon: Toni Arthurs, MD;  Location: [facility] OR;  Service: Orthopedics;  Laterality: Left;  Revision of Left Below Knee Amputation    Family History  Problem Relation Age of Onset  . Pneumonia Mother   . Gallbladder disease Mother     cancer  . Heart failure Mother   . Diabetes Father   . Coronary artery disease Father   . Stroke Neg Hx     History  Substance Use Topics  . Smoking status: Current Every Day Smoker --  0.2 packs/day for 40 years    Types: Cigarettes  . Smokeless tobacco: Never Used  . 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    OB History    Grav Para Term Preterm Abortions TAB SAB Ect Mult Living                  Review of Systems  Constitutional: Negative for fever and chills.  HENT: Negative for congestion, neck pain and sinus pressure.   Eyes: Negative for pain.  Respiratory: Negative for cough and shortness of breath.   Cardiovascular: Negative for chest pain and palpitations.  Gastrointestinal: Negative for abdominal pain.  Genitourinary: Negative for flank pain.   Musculoskeletal: Negative for back pain.  Skin: Negative for rash.  Neurological: Positive for numbness.  Hematological: Does not bruise/bleed easily.  Psychiatric/Behavioral: Negative for confusion.    Allergies  Banana; Penicillins; and Strawberry  Home Medications   Current Outpatient Rx  Name  Route  Sig  Dispense  Refill  . CIPROFLOXACIN HCL 250 MG PO TABS   Oral   Take 250 mg by mouth 2 (two) times daily.         Marland Kitchen CLINDAMYCIN HCL 300 MG PO CAPS   Oral   Take 600 mg by mouth 3 (three) times daily.         Marland Kitchen CLOPIDOGREL BISULFATE 75 MG PO TABS   Oral   Take 75 mg by mouth daily.         Marland Kitchen GLIMEPIRIDE 4 MG PO TABS   Oral   Take 4 mg by mouth daily.          Marland Kitchen HYDROCHLOROTHIAZIDE 25 MG PO TABS   Oral   Take 25 mg by mouth daily.         Marland Kitchen HYDROCODONE-ACETAMINOPHEN 5-325 MG PO TABS   Oral   Take 1 tablet by mouth every 4 (four) hours as needed for pain.   10 tablet   0   . IBUPROFEN 400 MG PO TABS   Oral   Take 1 tablet (400 mg total) by mouth every 6 (six) hours as needed for pain.   30 tablet   0   . INSULIN ASPART 100 UNIT/ML [place] SOLN   Subcutaneous   Inject 6 Units into the skin 3 (three) times daily before meals.          . INSULIN GLARGINE 100 UNIT/ML [place] SOLN   Subcutaneous   Inject 20 Units into the skin at bedtime.         Marland Kitchen POLYSACCHARIDE IRON COMPLEX 150 MG PO CAPS   Oral   Take 150 mg by mouth 2 (two) times daily.         Marland Kitchen LISINOPRIL 20 MG PO TABS   Oral   Take 20 mg by mouth daily.         Marland Kitchen MECLIZINE HCL 25 MG PO TABS   Oral   Take 25 mg by mouth 3 (three) times daily as needed. dizziness         . METFORMIN HCL 500 MG PO TABS   Oral   Take 250 mg by mouth 2 (two) times daily with a meal.         . METHOCARBAMOL 500 MG PO TABS   Oral   Take 500 mg by mouth 2 (two) times daily as needed.  For muscle spasms         . MORPHINE SULFATE 15 MG PO TABS   Oral   Take 15 mg by mouth every 4 (four) hours as  needed. For pain         . SIMVASTATIN 40 MG PO TABS   Oral   Take 40 mg by mouth every evening.           BP 152/83  Pulse 81  Temp 97.6 F (36.4 C) (Oral)  Resp 18  SpO2 100%  Physical Exam  Nursing note and vitals reviewed. Constitutional: She is oriented to person, place, and time. She appears well-developed and well-nourished. No distress.  HENT:  Head: Atraumatic.  Nose: Nose normal.  Mouth/Throat: Oropharynx is clear and moist.       No sinus or temporal tenderness.  Eyes: Conjunctivae normal and EOM are normal. Pupils are equal, round, and reactive to light. No scleral icterus.  Neck: Neck supple. No tracheal deviation present. No thyromegaly present.       No stiffness or rigidity. No bruit.  Left trap muscular tenderness  Cardiovascular: Normal rate, regular rhythm, normal heart sounds and intact distal pulses.  Exam reveals no gallop and no friction rub.   No murmur heard. Pulmonary/Chest: Effort normal and breath sounds normal. No respiratory distress.  Abdominal: Soft. Normal appearance and bowel sounds are normal. She exhibits no distension. There is no tenderness.  Genitourinary:       No cva tenderness.  Musculoskeletal: Normal range of motion. She exhibits no edema and no tenderness.  Neurological: She is alert and oriented to person, place, and time. No cranial nerve deficit.       Motor intact bilaterally. Steady gait.   Skin: Skin is warm and dry. No rash noted. She is not diaphoretic.  Psychiatric: She has a normal mood and affect.    ED Course  Procedures (including critical care time)  Results for orders placed during the hospital encounter of 06/24/12  CBC      Component Value Range   WBC 8.3  4.0 - 10.5 K/uL   RBC 4.96  3.87 - 5.11 MIL/uL   Hemoglobin 13.3  12.0 - 15.0 g/dL   HCT 19.1  47.8 - 29.5 %   MCV 82.5  78.0 - 100.0 fL   MCH 26.8  26.0 - 34.0 pg   MCHC 32.5  30.0 - 36.0 g/dL   RDW 62.1 (*) 30.8 - 65.7 %   Platelets 226  150 -  400 K/uL  PROTIME-INR      Component Value Range   Prothrombin Time 12.3  11.6 - 15.2 seconds   INR 0.92  0.00 - 1.49  APTT      Component Value Range   aPTT 21 (*) 24 - 37 seconds  POCT I-STAT, CHEM 8      Component Value Range   Sodium 140  135 - 145 mEq/L   Potassium 4.1  3.5 - 5.1 mEq/L   Chloride 105  96 - 112 mEq/L   BUN 18  6 - 23 mg/dL   Creatinine, Ser 8.46  0.50 - 1.10 mg/dL   Glucose, Bld 962 (*) 70 - 99 mg/dL   Calcium, Ion 9.52 (*) 1.12 - 1.23 mmol/L   TCO2 28  0 - 100 mmol/L   Hemoglobin 13.6  12.0 - 15.0 g/dL   HCT 84.1  32.4 - 40.1 %     Ct Head Wo Contrast  06/24/2012  *RADIOLOGY REPORT*  Clinical Data: Left-sided weakness.  History of CVA, obesity, hypertension, hyperlipidemia.  CT HEAD WITHOUT CONTRAST  Technique:  Contiguous axial images were obtained from the base of the skull through the vertex without contrast.  Comparison: 05/26/2012 and brain MR of 01/07/2012.  Findings: Bone windows demonstrate clear paranasal sinuses and mastoid air cells.  Soft tissue windows demonstrate age advanced cerebral atrophy. Cerebellar atrophy as well.  Dilated perivascular space versus remote lacunar infarct in the left basil ganglia on image 14/series 2.  Mild low density in the periventricular white matter likely related to small vessel disease. No  mass lesion, hemorrhage, hydrocephalus, acute infarct, intra-axial, or extra-axial fluid collection.  IMPRESSION:  1. No acute intracranial abnormality. 2.  Age advanced cerebral and cerebellar atrophy.  Findings called to Dr. Roseanne Reno at 1:10 p.m.   Original Report Authenticated By: Jeronimo Greaves, M.D.       MDM  Iv ns. Labs.  Reviewed nursing notes and prior charts for additional history.     Date: 06/24/2012  Rate: 83  Rhythm: normal sinus rhythm  QRS Axis: normal  Intervals: normal  ST/T Wave abnormalities: nonspecific ST/T changes  Conduction Disutrbances:none  Narrative Interpretation:   Old EKG Reviewed:  unchanged  pts symptoms felt atypical for tia/cva as c/o pain along w numbness.   Dr Roseanne Reno from neurology has seen.  States ct neg acute.  He indicates not candidate for tpa or other intervention. He requests admit to medical service for tia/cva workup.  Medical service called.  Discussed w triad, states neuro tele floor team 2.        Suzi Roots, MD 06/24/12 1413  Suzi Roots, MD 06/24/12 1433  Suzi Roots, MD 06/24/12 1434

## 2012-06-24 NOTE — Code Documentation (Signed)
57 yo female who was at work when at 1000 she had sudden onset of ha and blurred vision, with some LUE numbness. She was noted to lean her head over on her desk. She works at a company that [employer]. EMS was called and activated Code Stroke at 1219.  She arrived at 75. Stroke team was here at 1230. EDP examined pt at 1248.  She arrived in CT at 1250 and the study was uneventful.  She was returned to room A-9, where NIHSS was 3: one each for drift of LUE and LLE, and some sensory deficit that she describes as less in right face, left arm, and right let.  These are very similar sx to previous admissions, and pt a little vague re: if sx had actually completely cleared since she was seen in January.  Code stroke was canceled at 1315. Plan is to admit for stroke w/u since she has multiple risk factors including HTN, DN and smoking.  Pt is agreeable with the plan. Handoff to ED staff.

## 2012-06-24 NOTE — ED Notes (Signed)
PER EMS: Pt was at work and around 10 am began to feel symptoms; co workers saw pt lay her head down; pt c/o left sided weakness, left arm numbness, headache and blurred vision; hx of HTN and diabetes; CBG 220 BP 185/90 HR in 80s compliant with medications this morning; L BKA allergy to penicillin

## 2012-06-24 NOTE — Progress Notes (Signed)
Hypoglycemic Event  CBG: 66   Treatment: 15 GM carbohydrate snack  Symptoms: Confusion  Follow-up CBG: Time:2210 CBG Result:85  Possible Reasons for Event: Medication regimen: 6 unit of meal coverage given. Pt states she ate her dinner.   Comments/MD notified:When first CBG  was taken, patient was more confused that before. She was unable to tell me why she was in the hospital. After consuming snack and OJ CBG at 85, patient could tell me that she was in the hospital because she thought she was having a stroke. MD was notified. Order to hold lantus tonight.     Jacqulyn Ducking E  Remember to initiate Hypoglycemia Order Set & complete

## 2012-06-24 NOTE — ED Notes (Signed)
CODE STROKE CANCEL

## 2012-06-24 NOTE — ED Notes (Signed)
IV Team called 

## 2012-06-24 NOTE — ED Notes (Signed)
Report given to floor nurse, Riley Lam, RN. Nurse has no further questions upon report given. Serita Sheller, EMT transporting pt to floor.

## 2012-06-24 NOTE — H&P (Signed)
[facility] History and Physical  [name] QIO:[id number] DOB: 1956-02-24 DOA: 06/24/2012  Referring physician: Denton Lank PCP: [name], MD  Specialists: Neurology  Chief Complaint: Weakness, numbness etc.  HPI: [name] is a 57 y.o. female Presented to [facility] ed on 2.6.14 with Headache , L arm pain and Nausea.  SHe was at work at [employer] comp]  She was doing some document work and was totally fine this am when her sister took her to [employer].. The first issue to occur was the headache-it was on the L side-just L sided headache-it was in the back of the head-it seemed to last forever. Right after she started to have throbbing L amr weakness. No LOC, no slurred speech-no seizure activity at the time, she did have some new blurred vision on the L side.  Nausea came on as well but she did not vomit. These symptoms have happened since her 2008 CVA  She initially came into the emergency room as a code stroke, neurology saw patient and opined that findings for minimal and her code stroke was canceled. Metabolic panel essentially normal, CBC normal, glucose slightly elevated and 200 range. CT scan head contrast showed no acute intracranial abnormality age advanced cerebral and cerebellar atrophy  Review of Systems: The patient denies no sick contacts, no family illneses, no diarrhoea, no neck stiffness. No sob, no CP, no no cough no cold, no fever, no LE weakness-no loss of sensation  Past Medical History  Diagnosis Date  . Atypical chest pain     a. non obstructive by cath in 2008 and 2010;  b. 02/2012 Myoview: non-ischemic, EF 57%  . HTN (hypertension)   . Hyperlipidemia   . Syncope and collapse     a. near-syncopal episode in November 2008;  b. 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  . 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 Peripheral neuropathy   . Obesity, unspecified   . Esophageal reflux   . Diabetes mellitus,  type II   . Diaphragmatic hernia without mention of obstruction or gangrene   . Irritable bowel syndrome   . Morbid obesity   . Cellulitis     a. 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 Stroke     2013-'no residual'  . Vertigo   . Dry skin   . Constipation   . Tobacco abuse     Seen in emergency room 05/26/2012 for headache generalized with left arm numbness Admission 01/13/2012-revision left BKA History of syncope with ILR implantation and then explantation. Nuclear medicine cardiac stress test 10/60/13 = normal stress nuclear Admission to 21 for atypical chest pain, dizziness/vertigo-MRI that time showed artifact versus very small acute infarct Admission 12/23/2011 for swelling of left foot-had dictation left foot first ray 12/30/2011 and was given by mouth Abx on d/c Admission 12/06/2011 with acute right brain CVA-MRI showed acute right frontal lobe CVA echo showed hypokinesis 45% EF with no hemodynamically significant stenosis. Admission to the 12th 13 with syncope-CT head done, MRI brain unremarkable. Poor diabetes control at this visi Admission 04/27/2007 syncope secondary to orthostasis CVA right parietal region on this admission Admission 04/12/1999 and with syncopal episode vasovagal issues Admission 03/25/2007 for chest pain was ruled out for MI Cath 03/24/2007 = diffuse three-vessel disease segment of 70-80% proximal RCA, 78 sent marginal intermediate disease  Past Surgical History  Procedure Date  . Invasive electrophysiologic study 5/09    followed by insertion of an implantable loop recorder.  S/p removal   . Colonoscopy   . Cholecystectomy   . Multiple toe surgeries   . Cardiac catheterization     LAD 30%, circumflex 50%, OM 75%, RI 60% with small branch 80%, dominant RCA 60%, EF 45-50%  . I&d extremity 12/23/2011    Procedure: IRRIGATION AND DEBRIDEMENT EXTREMITY;  Surgeon: Verlee Rossetti, MD;  Location: [facility] OR;  Service: Orthopedics;  Laterality: Left;  Left Foot  . I&d extremity  12/26/2011    Procedure: IRRIGATION AND DEBRIDEMENT EXTREMITY;  Surgeon: Toni Arthurs, MD;  Location: [facility] OR;  Service: Orthopedics;  Laterality: Left;  LEFT FOOT I&D WITH POSSIBLE WOUND VAC APPLICATION, POSSIBLE LEFT FIRST RAY AMPUTATION  . Amputation 12/30/2011    Procedure: AMPUTATION RAY;  Surgeon: Toni Arthurs, MD;  Location: [facility] OR;  Service: Orthopedics;  Laterality: Left;  LEFT FIRST RAY AMPUTATION  . Eye surgery     cataract  . Amputation 01/13/2012    Procedure: AMPUTATION BELOW KNEE;  Surgeon: Toni Arthurs, MD;  Location: [facility] OR;  Service: Orthopedics;  Laterality: Left;  . Amputation 03/04/2012    Procedure: AMPUTATION BELOW KNEE;  Surgeon: Toni Arthurs, MD;  Location: [facility] OR;  Service: Orthopedics;  Laterality: Left;  Revision of Left Below Knee Amputation   Social History:  reports that she has been smoking Cigarettes.  She has a 10 pack-year smoking history. She has never used smokeless tobacco. She reports that she drinks about 2.4 ounces of alcohol per week. She reports that she does not use illicit drugs. Lives alone at home   Allergies  Allergen Reactions  . Banana Other (See Comments)    Facial swelling  . Penicillins Itching     edema  . Strawberry Other (See Comments)    Facial swelling    Family History  Problem Relation Age of Onset  . Pneumonia Mother   . Gallbladder disease Mother     cancer  . Heart failure Mother   . Diabetes Father   . Coronary artery disease Father   . Stroke Neg Hx      Prior to Admission medications   Medication Sig Start Date End Date Taking? Authorizing Provider  acetaminophen (TYLENOL) 500 MG tablet Take 1,000 mg by mouth every 6 (six) hours as needed. For pain   Yes Historical Provider, MD  clopidogrel (PLAVIX) 75 MG tablet Take 75 mg by mouth daily.   Yes Historical Provider, MD  glimepiride (AMARYL) 4 MG tablet Take 4 mg by mouth daily.    Yes Historical Provider, MD  hydrochlorothiazide (HYDRODIURIL) 25 MG tablet Take 25 mg by mouth  daily.   Yes Historical Provider, MD  insulin aspart (NOVOLOG) 100 UNIT/ML injection Inject 6 Units into the skin 3 (three) times daily before meals.    Yes Historical Provider, MD  insulin glargine (LANTUS) 100 UNIT/ML injection Inject 20 Units into the skin at bedtime.   Yes Historical Provider, MD  lisinopril (PRINIVIL,ZESTRIL) 20 MG tablet Take 20 mg by mouth daily.   Yes Historical Provider, MD  metFORMIN (GLUCOPHAGE) 500 MG tablet Take 250 mg by mouth 2 (two) times daily with a meal.   Yes Historical Provider, MD  methocarbamol (ROBAXIN) 500 MG tablet Take 500 mg by mouth 2 (two) times daily as needed. For muscle spasms   Yes Historical Provider, MD  simvastatin (ZOCOR) 40 MG tablet Take 40 mg by mouth every evening.   Yes Historical Provider, MD   Physical Exam: Filed Vitals:   06/24/12 1313 06/24/12 1427  BP: 152/83   Pulse: 81   Temp: 97.6 F (36.4 C) 97.5 F (36.4 C)  TempSrc: Oral   Resp: 18   SpO2: 100%      General:  Alert pleasant oriented Afro-American female, for head wrinkles appropriately smile symmetrical, uvula midline, extraocular movements intact, vision by direct confrontation is normal  Eyes: See above no pallor no icterus fundus sharp and crisp  ENT: Soft supple nontender no thyromegaly  Neck: no JVD, no Carotid bruit  Cardiovascular: s1 s2 no m/r/g  Respiratory: clinically clear  Abdomen: Soft nontender nondistended  Skin: No lower extremity  Musculoskeletal: Age motion grossly intact  Psychiatric: Euthymic however flat affect Neurologic: Neurologic: Cranial nerves:  I: smell Not tested  II: visual acuity  OS: good    OD: good  II: visual fields Full to confrontation  II: pupils Equal, round, reactive to light  III,VII: ptosis None  III,IV,VI: extraocular muscles  Full ROM  V: mastication Normal  V: facial light touch sensation  Normal  V,VII: corneal reflex  Present  VII: facial muscle function - upper  Normal  VII: facial muscle function  - lower Normal  VIII: hearing Not tested  IX: soft palate elevation  Normal  IX,X: gag reflex Present  XI: trapezius strength  5/5  XI: sternocleidomastoid strength 5/5  XI: neck flexion strength  5/5  XII: tongue strength  Normal    Motor: grossly normal  Reflexes: 2+ and symmetric  Coordination: somewhat limited in LLE 2/2 to BKA but overall normal    Labs on Admission:  Basic Metabolic Panel:  Lab 06/24/12 6578 06/24/12 1326  NA 140 139  K 4.1 4.1  CL 105 103  CO2 -- 23  GLUCOSE 219* 213*  BUN 18 17  CREATININE 1.00 0.62  CALCIUM -- 9.4  MG -- --  PHOS -- --   Liver Function Tests: No results found for this basename: AST:5,ALT:5,ALKPHOS:5,BILITOT:5,PROT:5,ALBUMIN:5 in the last 168 hours No results found for this basename: LIPASE:5,AMYLASE:5 in the last 168 hours No results found for this basename: AMMONIA:5 in the last 168 hours CBC:  Lab 06/24/12 1331 06/24/12 1326  WBC -- 8.3  NEUTROABS -- --  HGB 13.6 13.3  HCT 40.0 40.9  MCV -- 82.5  PLT -- 226   Cardiac Enzymes: No results found for this basename: CKTOTAL:5,CKMB:5,CKMBINDEX:5,TROPONINI:5 in the last 168 hours  BNP (last 3 results)  Basename 01/07/12 1126  PROBNP 750.5*   CBG: No results found for this basename: GLUCAP:5 in the last 168 hours  Radiological Exams on Admission: Ct Head Wo Contrast  06/24/2012  *RADIOLOGY REPORT*  Clinical Data: Left-sided weakness.  History of CVA, obesity, hypertension, hyperlipidemia.  CT HEAD WITHOUT CONTRAST  Technique:  Contiguous axial images were obtained from the base of the skull through the vertex without contrast.  Comparison: 05/26/2012 and brain MR of 01/07/2012.  Findings: Bone windows demonstrate clear paranasal sinuses and mastoid air cells.  Soft tissue windows demonstrate age advanced cerebral atrophy. Cerebellar atrophy as well.  Dilated perivascular space versus remote lacunar infarct in the left basil ganglia on image 14/series 2.  Mild low density  in the periventricular white matter likely related to small vessel disease. No  mass lesion, hemorrhage, hydrocephalus, acute infarct, intra-axial, or extra-axial fluid collection.  IMPRESSION:  1. No acute intracranial abnormality. 2.  Age advanced cerebral and cerebellar atrophy.  Findings called to Dr. Roseanne Reno at 1:10 p.m.   Original Report Authenticated By: Jeronimo Greaves, M.D.  EKG: Independently reviewed. Normal sinus rhythm PR interval 0.12 T wave flattening across V4 through 6 QRS axis 70 no ST-T wave changes per se  Assessment/Plan Principal Problem:  *TIA (transient ischemic attack) Active Problems:  HYPERLIPIDEMIA  HYPERTENSION  SYNCOPE  DM type 2, uncontrolled, with neuropathy  H/O  Numbness  Tobacco abuse   1. ?TIA-she certainly is at higher risk-we will get MRI of the brain- echocardiogram and carotid ultrasound been performed within the past 6-7 months and unlikely would be significantly changed-patient is already on Plavix 75 has received aspirin 325 and may benefit from dual antiplatelet therapy on discharge home-neurology to determine best options 2. Status post recent below-knee outpatient-patient has yet to start and bleeding with physical therapy occupational therapy with her prosthesis. 3. History of CAD?-Agent has had a normal cardiac cath in 2000 and 2000 and and in 2013 had a Myoview that was negative carotid BKA surgery 4. History hypertension-hold HCTZ 25 daily, lisinopril 20 daily and allow blood pressure to run high for her first 24 hours. 5. H/o diabetes mellitus-start sliding scale coverage. Hold home dose of glimepiride 4 mg daily/Metformin 250 bid-he will continue Lantus 20 units at bedtime and NovoLog 6 units 3 times a day before meals 6. Tobacco abuse-patient amenable to a trial of nicotine patch 7 mg every 24  Neurology has already seen the patient. They will follow his followup   Code Status: Full  Family Communication: Discussed plan of care and  patient personally while in room Disposition Plan: Inpatient  Time spent: 23  Mahala Menghini [facility] Pager [phone]  If 7PM-7AM, please contact night-coverage [email or web address] Password [facility] 06/24/2012, 2:33 PM

## 2012-06-24 NOTE — Consult Note (Signed)
Referring Physician: Denton Lank    Chief Complaint: blurred vision and left sided weakness  HPI:                                                                                                                                         [name] is an 57 y.o. female who was at work when she noted sudden onset HA, blurred vision and some left sided weakness. EMS was called and patient was brought to [facility] ED asa code stroke. At time of arrival to ED patient showed a mild drift in left arm and continued to complain about blurred vision however her symptoms were improving.  Initial CT head showed no acute infarct. Looking back in the hospital charts patient had recently been seen on 05/26/12 for similar symptoms.  Code stroke was canceled due to resolving symptoms.   LSN: 10 am tPA Given: No: mild symptoms which are resolving NIHSS-3  Past Medical History  Diagnosis Date  . Atypical chest pain     a. non obstructive by cath in 2008 and 2010;  b. 02/2012 Myoview: non-ischemic, EF 57%  . HTN (hypertension)   . Hyperlipidemia   . Syncope and collapse     a. near-syncopal episode in November 2008;  b. 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  . 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 Peripheral neuropathy   . Obesity, unspecified   . Esophageal reflux   . Diabetes mellitus, type II   . Diaphragmatic hernia without mention of obstruction or gangrene   . Irritable bowel syndrome   . Morbid obesity   . Cellulitis     a. 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 Stroke     2013-'no residual'  . Vertigo   . Dry skin   . Constipation   . Tobacco abuse     Past Surgical History  Procedure Date  . Invasive electrophysiologic study 5/09    followed by insertion of an implantable loop recorder. S/p removal   . Colonoscopy   . Cholecystectomy   . Multiple toe surgeries   . Cardiac catheterization     LAD 30%, circumflex 50%, OM 75%, RI 60% with small branch 80%,  dominant RCA 60%, EF 45-50%  . I&d extremity 12/23/2011    Procedure: IRRIGATION AND DEBRIDEMENT EXTREMITY;  Surgeon: Verlee Rossetti, MD;  Location: [facility] OR;  Service: Orthopedics;  Laterality: Left;  Left Foot  . I&d extremity 12/26/2011    Procedure: IRRIGATION AND DEBRIDEMENT EXTREMITY;  Surgeon: Toni Arthurs, MD;  Location: [facility] OR;  Service: Orthopedics;  Laterality: Left;  LEFT FOOT I&D WITH POSSIBLE WOUND VAC APPLICATION, POSSIBLE LEFT FIRST RAY AMPUTATION  . Amputation 12/30/2011    Procedure: AMPUTATION RAY;  Surgeon: Toni Arthurs, MD;  Location: [facility] OR;  Service: Orthopedics;  Laterality: Left;  LEFT FIRST RAY AMPUTATION  . Eye  surgery     cataract  . Amputation 01/13/2012    Procedure: AMPUTATION BELOW KNEE;  Surgeon: Toni Arthurs, MD;  Location: [facility] OR;  Service: Orthopedics;  Laterality: Left;  . Amputation 03/04/2012    Procedure: AMPUTATION BELOW KNEE;  Surgeon: Toni Arthurs, MD;  Location: [facility] OR;  Service: Orthopedics;  Laterality: Left;  Revision of Left Below Knee Amputation    Family History  Problem Relation Age of Onset  . Pneumonia Mother   . Gallbladder disease Mother     cancer  . Heart failure Mother   . Diabetes Father   . Coronary artery disease Father   . Stroke Neg Hx    Social History:  reports that she has been smoking Cigarettes.  She has a 10 pack-year smoking history. She has never used smokeless tobacco. She reports that she drinks about 2.4 ounces of alcohol per week. She reports that she does not use illicit drugs.  Allergies:  Allergies  Allergen Reactions  . Banana Other (See Comments)    Facial swelling  . Penicillins Itching     edema  . Strawberry Other (See Comments)    Facial swelling    Medications:                                                                                                                           Current Facility-Administered Medications  Medication Dose Route Frequency Provider Last Rate Last Dose  . 0.9 %  sodium  chloride infusion   Intravenous Continuous Suzi Roots, MD       Current Outpatient Prescriptions  Medication Sig Dispense Refill  . clopidogrel (PLAVIX) 75 MG tablet Take 75 mg by mouth daily.      Marland Kitchen glimepiride (AMARYL) 4 MG tablet Take 4 mg by mouth daily.       . hydrochlorothiazide (HYDRODIURIL) 25 MG tablet Take 25 mg by mouth daily.      Marland Kitchen HYDROcodone-acetaminophen (NORCO/VICODIN) 5-325 MG per tablet Take 1 tablet by mouth every 4 (four) hours as needed for pain.  10 tablet  0  . ibuprofen (ADVIL,MOTRIN) 400 MG tablet Take 1 tablet (400 mg total) by mouth every 6 (six) hours as needed for pain.  30 tablet  0  . insulin aspart (NOVOLOG) 100 UNIT/ML injection Inject 6 Units into the skin 3 (three) times daily before meals.       . insulin glargine (LANTUS) 100 UNIT/ML injection Inject 20 Units into the skin at bedtime.      . iron polysaccharides (NIFEREX) 150 MG capsule Take 150 mg by mouth 2 (two) times daily.      Marland Kitchen lisinopril (PRINIVIL,ZESTRIL) 20 MG tablet Take 20 mg by mouth daily.      . meclizine (ANTIVERT) 25 MG tablet Take 25 mg by mouth 3 (three) times daily as needed. dizziness      . metFORMIN (GLUCOPHAGE) 500 MG tablet Take 250 mg by mouth 2 (two) times daily with a  meal.      . methocarbamol (ROBAXIN) 500 MG tablet Take 500 mg by mouth 2 (two) times daily as needed. For muscle spasms      . morphine (MSIR) 15 MG tablet Take 15 mg by mouth every 4 (four) hours as needed. For pain      . simvastatin (ZOCOR) 40 MG tablet Take 40 mg by mouth every evening.         ROS:                                                                                                                                       History obtained from the patient  General ROS: negative for - chills, fatigue, fever, night sweats, weight gain or weight loss Psychological ROS: negative for - behavioral disorder, hallucinations, memory difficulties, mood swings or suicidal ideation Ophthalmic ROS:  positive for - blurry vision, ENT ROS: negative for - epistaxis, nasal discharge, oral lesions, sore throat, tinnitus or vertigo Allergy and Immunology ROS: negative for - hives or itchy/watery eyes Hematological and Lymphatic ROS: negative for - bleeding problems, bruising or swollen lymph nodes Endocrine ROS: negative for - galactorrhea, hair pattern changes, polydipsia/polyuria or temperature intolerance Respiratory ROS: negative for - cough, hemoptysis, shortness of breath or wheezing Cardiovascular ROS: negative for - chest pain, dyspnea on exertion, edema or irregular heartbeat Gastrointestinal ROS: negative for - abdominal pain, diarrhea, hematemesis, nausea/vomiting or stool incontinence Genito-Urinary ROS: negative for - dysuria, hematuria, incontinence or urinary frequency/urgency Musculoskeletal ROS: positive for - left arm pain Neurological ROS: as noted in HPI Dermatological ROS: negative for rash and skin lesion changes  Neurologic Examination:                                                                                                      Blood pressure 152/83, pulse 81, temperature 97.6 F (36.4 C), temperature source Oral, resp. rate 18, SpO2 100.00%.  Mental Status: Alert, oriented, thought content appropriate.  Speech fluent without evidence of aphasia.  Able to follow 3 step commands without difficulty. Cranial Nerves: II: Discs flat bilaterally; Visual fields grossly normal in all fields, pupils equal, round, reactive to light and accommodation III,IV, VI: ptosis not present, extra-ocular motions intact bilaterally V,VII: smile symmetric, facial light touch sensation stated to be decreased on the right VIII: hearing normal bilaterally IX,X: gag reflex present XI: bilateral shoulder shrug XII: midline tongue extension Motor: Right : Upper extremity   5/5    Left:  Upper extremity   4/5  Lower extremity   5/5     Lower extremity   5/5 Tone and bulk:normal tone  throughout; no atrophy noted Sensory: Pinprick and light touch intact throughout, bilaterally but stated to be decreased on the right arm Deep Tendon Reflexes: 2+ and symmetric throughout Plantars: Right: downgoing   Left: BKA Cerebellar: normal finger-to-nose,  normal heel-to-shin test CV: pulses palpable throughout    No results found for this or any previous visit (from the past 48 hour(s)). Ct Head Wo Contrast  06/24/2012  *RADIOLOGY REPORT*  Clinical Data: Left-sided weakness.  History of CVA, obesity, hypertension, hyperlipidemia.  CT HEAD WITHOUT CONTRAST  Technique:  Contiguous axial images were obtained from the base of the skull through the vertex without contrast.  Comparison: 05/26/2012 and brain MR of 01/07/2012.  Findings: Bone windows demonstrate clear paranasal sinuses and mastoid air cells.  Soft tissue windows demonstrate age advanced cerebral atrophy. Cerebellar atrophy as well.  Dilated perivascular space versus remote lacunar infarct in the left basil ganglia on image 14/series 2.  Mild low density in the periventricular white matter likely related to small vessel disease. No  mass lesion, hemorrhage, hydrocephalus, acute infarct, intra-axial, or extra-axial fluid collection.  IMPRESSION:  1. No acute intracranial abnormality. 2.  Age advanced cerebral and cerebellar atrophy.  Findings called to Dr. Roseanne Reno at 1:10 p.m.   Original Report Authenticated By: Jeronimo Greaves, M.D.     Assessment and plan discussed with with attending physician and they are in agreement.    Felicie Morn PA-C [facility] [phone]  06/24/2012, 1:21 PM   Assessment: 57 y.o. female presenting to ED with HA, blurred vision and left arm weakness. Code stroke was canceled due to minimal symptoms which were resolving while in the ED.  Exam shows only mild weakness of left UE and reported decrease sensation of right face and arm.  With patients multiple risk factors cannot completely rule out  TIA/CVA with resolving symptoms.  Complex migraine HA with left sided weakness is also in the differential.  Patient currently takes Plavix and states she has been compliant.   Stroke Risk Factors - diabetes mellitus, hyperlipidemia, hypertension and smoking  Plan: 1. HgbA1c, fasting lipid panel 2. MRI, MRA  of the brain without contrast 3. PT consult, OT consult, Speech consult 4. Echocardiogram 5. Carotid dopplers 6. Prophylactic therapy-Antiplatelet med: Plavix - dose 75 mg daily 7. Risk factor modification 8. Telemetry monitoring 9. Frequent neuro checks    Felicie Morn PA-C [facility]  I actively participated in this patient's evaluation and management including clinical assessment and formulation of management recommendations.  Venetia Maxon M.D. [facility] [phone]

## 2012-06-24 NOTE — ED Notes (Signed)
Pt states that she has numbness still in left arm; pt able to move arm; pt c/o chronic pain in left arm. Pt alert and mentating appropriately. Pt denies blurred vision.

## 2012-06-24 NOTE — ED Notes (Signed)
Pt undressed, in gown, on monitor, continuous pulse oximetry and blood pressure cuff 

## 2012-06-25 ENCOUNTER — Inpatient Hospital Stay (HOSPITAL_COMMUNITY): Payer: Managed Care, Other (non HMO)

## 2012-06-25 LAB — LIPID PANEL
HDL: 39 mg/dL — ABNORMAL LOW (ref >39)
Total CHOL/HDL Ratio: 4.8 RATIO
Triglycerides: 156 mg/dL — ABNORMAL HIGH (ref <150)

## 2012-06-25 LAB — GLUCOSE, CAPILLARY
Glucose-Capillary: 85 mg/dL (ref 70–99)
Glucose-Capillary: 119 mg/dL — ABNORMAL HIGH (ref 70–99)
Glucose-Capillary: 175 mg/dL — ABNORMAL HIGH (ref 70–99)

## 2012-06-25 LAB — HEMOGLOBIN A1C
Hgb A1c MFr Bld: 7.4 % — ABNORMAL HIGH (ref <5.7)
Mean Plasma Glucose: 166 mg/dL — ABNORMAL HIGH (ref <117)

## 2012-06-25 LAB — RAPID URINE DRUG SCREEN, HOSP PERFORMED
Amphetamines: NOT DETECTED
Barbiturates: NOT DETECTED
Opiates: NOT DETECTED
Tetrahydrocannabinol: NOT DETECTED

## 2012-06-25 MED ORDER — ASPIRIN-DIPYRIDAMOLE ER 25-200 MG PO CP12: 1.0000 | ORAL_CAPSULE | Freq: Every day | ORAL | Status: DC

## 2012-06-25 MED ORDER — ASPIRIN 81 MG PO CHEW
81.0000 mg | CHEWABLE_TABLET | Freq: Every day | ORAL | Status: DC
  Administered 2012-06-26: 81 mg via ORAL
  Filled 2012-06-25: qty 1

## 2012-06-25 MED ORDER — ASPIRIN-DIPYRIDAMOLE ER 25-200 MG PO CP12
1.0000 | ORAL_CAPSULE | Freq: Two times a day (BID) | ORAL | Status: DC
  Filled 2012-06-25 (×2): qty 1

## 2012-06-25 MED ORDER — ASPIRIN-DIPYRIDAMOLE ER 25-200 MG PO CP12
1.0000 | ORAL_CAPSULE | Freq: Every day | ORAL | Status: DC
  Filled 2012-06-25: qty 1

## 2012-06-25 NOTE — Progress Notes (Signed)
Utilization Review Completed.[name] T2/11/2012   

## 2012-06-25 NOTE — Evaluation (Signed)
Occupational Therapy Evaluation Patient Details Name: [name] MRN: [id number] DOB: 1955-05-23 Today's Date: 06/25/2012 Time: 7829-5621 OT Time Calculation (min): 20 min  OT Assessment / Plan / Recommendation Clinical Impression  57yr old female admitted for TIA, has history of LBKA as well.  Overall presents at a supervision level for simulated selfcare tasks.  Will benefit from acute care OT to help increased balance and endurance for all ADLS.  Don't feel pt will require any follow-up post acute care OT.  Will continue to follow while in acute stay.      OT Assessment  Patient needs continued OT Services    Follow Up Recommendations  No OT follow up       Equipment Recommendations  None recommended by OT       Frequency  Min 2X/week    Precautions / Restrictions Precautions Precautions: Fall Restrictions Weight Bearing Restrictions: No   Pertinent Vitals/Pain Vitals stable, no pain reported    ADL  Eating/Feeding: Simulated;Independent Where Assessed - Eating/Feeding: Edge of bed Grooming: Performed;Wash/dry hands Where Assessed - Grooming: Supported standing Upper Body Bathing: Simulated;Set up Where Assessed - Upper Body Bathing: Unsupported sitting Lower Body Bathing: Simulated;Supervision/safety Where Assessed - Lower Body Bathing: Supported sit to stand Upper Body Dressing: Simulated;Set up Where Assessed - Upper Body Dressing: Unsupported sitting Lower Body Dressing: Simulated;Supervision/safety Where Assessed - Lower Body Dressing: Supported sit to stand Toilet Transfer: Research scientist (life sciences) Method: Other (comment) (ambulate with RW) Acupuncturist: Comfort height toilet;Grab bars Toileting - Clothing Manipulation and Hygiene: Performed;Supervision/safety Where Assessed - Engineer, mining and Hygiene: Sit to stand from 3-in-1 or toilet Equipment Used: Rolling walker;Gait belt Transfers/Ambulation  Related to ADLs: Pt able to ambulate with the RW to the bathroom with min guard assist. ADL Comments: Pt reports using the wheelchair most of the time but did report using the RW in the bathroom secondary to her wheelchair not fitting through the door.      OT Diagnosis: Generalized weakness  OT Problem List: Decreased strength;Impaired balance (sitting and/or standing);Decreased knowledge of use of DME or AE OT Treatment Interventions: Self-care/ADL training;Patient/family education;Balance training;Therapeutic activities   OT Goals Acute Rehab OT Goals OT Goal Formulation: With patient Time For Goal Achievement: 07/02/12 Potential to Achieve Goals: Good ADL Goals Pt Will Perform Grooming: with modified independence;Standing at sink ADL Goal: Grooming - Progress: Goal set today Pt Will Perform Lower Body Bathing: with modified independence;Sit to stand from bed ADL Goal: Lower Body Bathing - Progress: Goal set today Pt Will Perform Lower Body Dressing: with modified independence;Sit to stand from bed;Supported ADL Goal: Lower Body Dressing - Progress: Goal set today Pt Will Transfer to Toilet: with modified independence;3-in-1;Ambulation;with DME ADL Goal: Toilet Transfer - Progress: Goal set today  Visit Information  Last OT Received On: 06/25/12 Assistance Needed: +1    Subjective Data  Subjective: I use the wheelchair mostly at home. Patient Stated Goal: Pt is looking forward to prosthetic training.   Prior Functioning     Home Living Lives With: Alone Available Help at Discharge: Family;Available PRN/intermittently Type of Home: House Home Access: Ramped entrance Home Layout: One level Bathroom Shower/Tub: Engineer, manufacturing systems: Standard Bathroom Accessibility: Yes How Accessible: Accessible via walker Home Adaptive Equipment: Bedside commode/3-in-1;Shower chair with back;Walker - rolling;Straight cane;Wheelchair - manual Prior Function Level of  Independence: Independent with assistive device(s) Able to Take Stairs?: No Driving: No Communication Communication: No difficulties Dominant Hand: Right  Vision/Perception Vision - History Baseline Vision: No visual deficits Patient Visual Report: No change from baseline Vision - Assessment Eye Alignment: Within Functional Limits Vision Assessment: Vision not tested Perception Perception: Within Functional Limits Praxis Praxis: Intact   Cognition  Cognition Overall Cognitive Status: Appears within functional limits for tasks assessed/performed Arousal/Alertness: Awake/alert Orientation Level: Appears intact for tasks assessed Behavior During Session: [facility] for tasks performed    Extremity/Trunk Assessment Right Upper Extremity Assessment RUE ROM/Strength/Tone: Within functional levels RUE Sensation: WFL - Light Touch RUE Coordination: WFL - gross/fine motor Left Upper Extremity Assessment LUE ROM/Strength/Tone: Within functional levels LUE Sensation: WFL - Light Touch LUE Coordination: WFL - gross/fine motor Trunk Assessment Trunk Assessment: Normal     Mobility Bed Mobility Bed Mobility: Supine to Sit Supine to Sit: 5: Supervision Transfers Transfers: Sit to Stand Sit to Stand: 5: Supervision;With upper extremity assist Stand to Sit: 5: Supervision;With upper extremity assist        Balance Balance Balance Assessed: Yes Dynamic Standing Balance Dynamic Standing - Balance Support: Right upper extremity supported;Left upper extremity supported Dynamic Standing - Level of Assistance: 5: Stand by assistance   End of Session OT - End of Session Equipment Utilized During Treatment: Gait belt Activity Tolerance: Patient tolerated treatment well Patient left: in bed;with call bell/phone within reach;with family/visitor present      Deontrae Drinkard OTR/L Pager number [phone] 06/25/2012, 4:39 PM

## 2012-06-25 NOTE — Progress Notes (Signed)
Order received for patient to travel without telemetry for MRI. Will continue to monitor.

## 2012-06-25 NOTE — Progress Notes (Addendum)
[facility] PROGRESS NOTE  [name] NFA:[id number] DOB: 1956/03/31 DOA: 06/24/2012 PCP: [name], MD  Assessment/Plan: Principal Problem:  *TIA (transient ischemic attack) Active Problems:  HYPERLIPIDEMIA  HYPERTENSION  SYNCOPE  DM type 2, uncontrolled, with neuropathy  H/O  Numbness  Tobacco abuse     Acute CVA. MRI showed 3 mm acute infarction in the mesial temporal region and the hyppocampus on the right ,- echocardiogram and carotid ultrasound been performed within the past 6-7 months and unlikely would be significantly changed-will be switched to Aggrenox for antiplatelet therapy. Neurology recommends at least 2 doses of Aggrenox prior to discharge    Status post recent below-knee outpatient-patient has yet to start and bleeding with physical therapy occupational therapy with her prosthesis.   History of CAD?-Agent has had a normal cardiac cath in 2000 and 2000 and and in 2013 had a Myoview that was negative prior to BKA surgery   History hypertension-hold HCTZ 25 daily, lisinopril 20 daily and allow blood pressure to run high for her first 24 hours.   H/o diabetes mellitus-start sliding scale coverage. Hold home dose of glimepiride 4 mg daily/Metformin 250 bid-he will continue Lantus 20 units at bedtime and NovoLog 6 units 3 times a day before meals   Tobacco abuse-patient amenable to a trial of nicotine patch 7 mg every    Code Status: full Family Communication: family updated about patient's clinical progress Disposition Plan:  Anticipate discharge tomorrow    Brief narrative: [name] is a 57 y.o. female Presented to [facility] ed on 2.6.14 with Headache , L arm pain and Nausea. SHe was at work at [employer] comp] She was doing some document work and was totally fine this am when her sister took her to [employer]..  The first issue to occur was the headache-it was on the L side-just L sided headache-it was in the back of the head-it seemed to  last forever. Right after she started to have throbbing L amr weakness. No LOC, no slurred speech-no seizure activity at the time, she did have some new blurred vision on the L side. Nausea came on as well but she did not vomit.  These symptoms have happened since her 2008 CVA  She initially came into the emergency room as a code stroke, neurology saw patient and opined that findings for minimal and her code stroke was canceled.  Metabolic panel essentially normal, CBC normal, glucose slightly elevated and 200 range. CT scan head contrast showed no acute intracranial abnormality age advanced cerebral and cerebellar atrophy   Consultants:  Neurology  Procedures:  None  Antibiotics:  None  HPI/Subjective: Stable overnight  Objective: Filed Vitals:   06/25/12 0000 06/25/12 0200 06/25/12 0600 06/25/12 0916  BP: 138/63 132/60 164/74 181/81  Pulse: 86 84 79 76  Temp: 97.6 F (36.4 C) 97.4 F (36.3 C) 97.7 F (36.5 C) 97.4 F (36.3 C)  TempSrc: Oral Oral Oral   Resp: 18 18 18 20   Height:      Weight:      SpO2: 100% 100% 100% 100%   No intake or output data in the 24 hours ending 06/25/12 1148  Exam:  General: Alert pleasant oriented Afro-American female, for head wrinkles appropriately smile symmetrical, uvula midline, extraocular movements intact, vision by direct confrontation is normal  Eyes: See above no pallor no icterus fundus sharp and crisp  ENT: Soft supple nontender no thyromegaly  Neck: no JVD, no Carotid bruit  Cardiovascular: s1 s2 no m/r/g  Respiratory: clinically clear  Abdomen: Soft nontender nondistended  Skin: No lower extremity  Musculoskeletal: Age motion grossly intact  Psychiatric: Euthymic however flat affect    Data Reviewed: Basic Metabolic Panel:  Lab 06/24/12 9562 06/24/12 1331 06/24/12 1326  NA -- 140 139  K -- 4.1 4.1  CL -- 105 103  CO2 -- -- 23  GLUCOSE -- 219* 213*  BUN -- 18 17  CREATININE 0.67 1.00 0.62  CALCIUM -- -- 9.4   MG -- -- --  PHOS -- -- --    Liver Function Tests: No results found for this basename: AST:5,ALT:5,ALKPHOS:5,BILITOT:5,PROT:5,ALBUMIN:5 in the last 168 hours No results found for this basename: LIPASE:5,AMYLASE:5 in the last 168 hours No results found for this basename: AMMONIA:5 in the last 168 hours  CBC:  Lab 06/24/12 1742 06/24/12 1331 06/24/12 1326  WBC 8.4 -- 8.3  NEUTROABS -- -- --  HGB 12.4 13.6 13.3  HCT 38.0 40.0 40.9  MCV 82.3 -- 82.5  PLT 199 -- 226    Cardiac Enzymes:  Lab 06/24/12 1751  CKTOTAL --  CKMB --  CKMBINDEX --  TROPONINI <0.30   BNP (last 3 results)  Basename 01/07/12 1126  PROBNP 750.5*     CBG:  Lab 06/25/12 0731 06/24/12 2209 06/24/12 2142 06/24/12 1724  GLUCAP 119* 85 66* 114*    No results found for this or any previous visit (from the past 240 hour(s)).   Studies: Dg Chest 2 View  06/07/2012  *RADIOLOGY REPORT*  Clinical Data: Chest pain and dizziness.  CHEST - 2 VIEW  Comparison: 02/09/2012.  Findings: Trachea is midline.  Heart size stable.  Lungs are clear. No pleural fluid.  Degenerative changes are seen in the spine.  IMPRESSION: No acute findings.   Original Report Authenticated By: Leanna Battles, M.D.    Ct Head Wo Contrast  06/24/2012  *RADIOLOGY REPORT*  Clinical Data: Left-sided weakness.  History of CVA, obesity, hypertension, hyperlipidemia.  CT HEAD WITHOUT CONTRAST  Technique:  Contiguous axial images were obtained from the base of the skull through the vertex without contrast.  Comparison: 05/26/2012 and brain MR of 01/07/2012.  Findings: Bone windows demonstrate clear paranasal sinuses and mastoid air cells.  Soft tissue windows demonstrate age advanced cerebral atrophy. Cerebellar atrophy as well.  Dilated perivascular space versus remote lacunar infarct in the left basil ganglia on image 14/series 2.  Mild low density in the periventricular white matter likely related to small vessel disease. No  mass lesion,  hemorrhage, hydrocephalus, acute infarct, intra-axial, or extra-axial fluid collection.  IMPRESSION:  1. No acute intracranial abnormality. 2.  Age advanced cerebral and cerebellar atrophy.  Findings called to Dr. Roseanne Reno at 1:10 p.m.   Original Report Authenticated By: Jeronimo Greaves, M.D.    Ct Head Wo Contrast  05/26/2012  *RADIOLOGY REPORT*  Clinical Data: Sudden onset severe headache with blurred vision and left arm numbness/tingling.  CT HEAD WITHOUT CONTRAST  Technique:  Contiguous axial images were obtained from the base of the skull through the vertex without contrast.  Comparison: MRA brain 01/07/2012 and CT head 12/23/2011.  Findings: No evidence of acute infarct, acute hemorrhage, mass lesion, mass effect or hydrocephalus.  Mild atrophy.  Visualized portions of the paranasal sinuses and mastoid air cells are clear.  IMPRESSION:  1.  No acute intracranial abnormality. 2.  Mild atrophy.   Original Report Authenticated By: Leanna Battles, M.D.    Ct Angio Chest W/cm &/or Wo Cm  06/07/2012  *RADIOLOGY REPORT*  Clinical Data: Severe chest pain.  CT ANGIOGRAPHY CHEST  Technique:  Multidetector CT imaging of the chest using the standard protocol during bolus administration of intravenous contrast. Multiplanar reconstructed images including MIPs were obtained and reviewed to evaluate the vascular anatomy.  Contrast: OMNIPAQUE IOHEXOL 350 MG/ML SOLN  Comparison: Chest radiograph 06/08/2011 and CT chest 01/08/2012.  Findings: Negative for pulmonary embolus.  No pathologically enlarged mediastinal, hilar or axillary lymph nodes.  Coronary artery calcification.  Heart is mildly enlarged.  No pericardial effusion.  Lungs are clear.  No pleural fluid.  Airway is unremarkable.  Incidental imaging of the upper abdomen shows no acute findings. No worrisome lytic or sclerotic lesions.  Degenerative changes are seen in the spine.  IMPRESSION:  1.  Negative for pulmonary embolus. 2.  Coronary artery calcification.    Original Report Authenticated By: Leanna Battles, M.D.    Mri Brain Without Contrast  06/25/2012  *RADIOLOGY REPORT*  Clinical Data:  Left-sided weakness  MRI HEAD WITHOUT CONTRAST MRA HEAD WITHOUT CONTRAST  Technique:  Multiplanar, multiecho pulse sequences of the brain and surrounding structures were obtained without intravenous contrast. Angiographic images of the head were obtained using MRA technique without contrast.  Comparison:  Head CT 06/24/2012.  MRI 01/07/2012.  MRI HEAD  Findings:  There is a 3 mm focus of restricted diffusion in the mesial temporal lobes / hippocampal region on the right consistent with a small acute infarction.  There is a punctate focus of restricted diffusion in the right frontal white matter also consistent with a small infarction.  No large vessel territory insult.  There are chronic small vessel changes affecting the pons.  There are a few old small vessel cerebellar infarctions.  There are old lacunar infarctions affecting the thalami and basal ganglia.  There are chronic small vessel insults throughout the hemispheric white matter. No mass lesion, hemorrhage, hydrocephalus or extra-axial collection.  No pituitary mass.  No fluid in the sinuses, middle ears or mastoids.  No skull or skull base lesion.  IMPRESSION: 3 mm acute infarction affecting the mesial temporal region / hippocampus on the right.  Punctate acute infarction in the right frontal white matter.  Neither shows swelling or hemorrhage.  Extensive chronic small vessel infarctions elsewhere throughout the brain as outlined above.  MRA HEAD  Findings: Both internal carotid arteries are widely patent into the brain.  There is mild narrowing in the siphon regions but no flow- limiting focal stenosis.  The anterior middle cerebral vessels are patent without proximal stenosis.  More distal branch vessels atherosclerotic irregularity. There are large posterior communicating arteries bilaterally.  These give the majority  supply to the posterior cerebral arteries.  Both vertebral arteries are patent to the basilar with the left being dominant.  There is mild atherosclerotic irregularity of the distal vertebral arteries but no focal stenosis.  The basilar artery shows atherosclerotic irregularity with narrowing of the proximal 1 cm of the vessel to a diameter of 50%.  There is flow in both superior cerebellar and posterior cerebral arteries, with there being rather considerable atherosclerotic narrowing in the branches.  IMPRESSION: No major vessel occlusion or correctable proximal stenosis. Diffuse intracranial atherosclerotic disease with narrowing and irregularity of the medium small vessels diffusely.  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  Compared to the previous MR angiogram of 2013, there is no discernible change.   Original Report Authenticated By: Paulina Fusi, M.D.    Mr [facility] Head/brain  Wo Cm  06/25/2012  *RADIOLOGY REPORT*  Clinical Data:  Left-sided weakness  MRI HEAD WITHOUT CONTRAST MRA HEAD WITHOUT CONTRAST  Technique:  Multiplanar, multiecho pulse sequences of the brain and surrounding structures were obtained without intravenous contrast. Angiographic images of the head were obtained using MRA technique without contrast.  Comparison:  Head CT 06/24/2012.  MRI 01/07/2012.  MRI HEAD  Findings:  There is a 3 mm focus of restricted diffusion in the mesial temporal lobes / hippocampal region on the right consistent with a small acute infarction.  There is a punctate focus of restricted diffusion in the right frontal white matter also consistent with a small infarction.  No large vessel territory insult.  There are chronic small vessel changes affecting the pons.  There are a few old small vessel cerebellar infarctions.  There are old lacunar infarctions affecting the thalami and basal ganglia.  There are chronic small vessel insults throughout the hemispheric white matter. No mass  lesion, hemorrhage, hydrocephalus or extra-axial collection.  No pituitary mass.  No fluid in the sinuses, middle ears or mastoids.  No skull or skull base lesion.  IMPRESSION: 3 mm acute infarction affecting the mesial temporal region / hippocampus on the right.  Punctate acute infarction in the right frontal white matter.  Neither shows swelling or hemorrhage.  Extensive chronic small vessel infarctions elsewhere throughout the brain as outlined above.  MRA HEAD  Findings: Both internal carotid arteries are widely patent into the brain.  There is mild narrowing in the siphon regions but no flow- limiting focal stenosis.  The anterior middle cerebral vessels are patent without proximal stenosis.  More distal branch vessels atherosclerotic irregularity. There are large posterior communicating arteries bilaterally.  These give the majority supply to the posterior cerebral arteries.  Both vertebral arteries are patent to the basilar with the left being dominant.  There is mild atherosclerotic irregularity of the distal vertebral arteries but no focal stenosis.  The basilar artery shows atherosclerotic irregularity with narrowing of the proximal 1 cm of the vessel to a diameter of 50%.  There is flow in both superior cerebellar and posterior cerebral arteries, with there being rather considerable atherosclerotic narrowing in the branches.  IMPRESSION: No major vessel occlusion or correctable proximal stenosis. Diffuse intracranial atherosclerotic disease with narrowing and irregularity of the medium small vessels diffusely.  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  Compared to the previous MR angiogram of 2013, there is no discernible change.   Original Report Authenticated By: Paulina Fusi, M.D.     Scheduled Meds:   . aspirin  81 mg Oral Daily  . dipyridamole-aspirin  1 capsule Oral BID  . heparin  5,000 Units Subcutaneous Q8H  . insulin glargine  10 Units Subcutaneous  QHS  . nicotine  7 mg Transdermal Daily  . simvastatin  40 mg Oral q1800   Continuous Infusions:   . sodium chloride 100 mL/hr at 06/24/12 1433  . sodium chloride 100 mL/hr at 06/24/12 1753    Principal Problem:  *TIA (transient ischemic attack) Active Problems:  HYPERLIPIDEMIA  HYPERTENSION  SYNCOPE  DM type 2, uncontrolled, with neuropathy  H/O  Numbness  Tobacco abuse    Time spent: 40 minutes   [facility]  [facility] Pager [phone]. If 8PM-8AM, please contact night-coverage at [email or web address], password [facility] 06/25/2012, 11:48 AM  LOS: 1 day

## 2012-06-25 NOTE — Progress Notes (Signed)
Stroke Team Progress Note  HISTORY [name] is an 57 y.o. female who was at work 06/24/2012 when she noted sudden onset HA, blurred vision and some left sided weakness. EMS was called and patient was brought to [facility] ED asa code stroke. At time of arrival to ED patient showed a mild drift in left arm and continued to complain about blurred vision however her symptoms were improving. Initial CT head showed no acute infarct. Looking back in the hospital charts patient had recently been seen on 05/26/12 for similar symptoms. Code stroke was canceled due to resolving symptoms. Patient was not a TPA candidate secondary to improving symptoms. She was admitted for further evaluation and treatment.  SUBJECTIVE No family is at the bedside.  Overall she feels her condition is gradually improving. Still with some numbness in left forearm.  OBJECTIVE Most recent Vital Signs: Filed Vitals:   06/25/12 0000 06/25/12 0200 06/25/12 0600 06/25/12 0916  BP: 138/63 132/60 164/74 181/81  Pulse: 86 84 79 76  Temp: 97.6 F (36.4 C) 97.4 F (36.3 C) 97.7 F (36.5 C) 97.4 F (36.3 C)  TempSrc: Oral Oral Oral   Resp: 18 18 18 20   Height:      Weight:      SpO2: 100% 100% 100% 100%   CBG (last 3)   Basename 06/25/12 0731 06/24/12 2209 06/24/12 2142  GLUCAP 119* 85 66*   IV Fluid Intake:     . sodium chloride 100 mL/hr at 06/24/12 1433  . sodium chloride 100 mL/hr at 06/24/12 1753   MEDICATIONS    . aspirin  81 mg Oral Daily  . clopidogrel  75 mg Oral Daily  . heparin  5,000 Units Subcutaneous Q8H  . insulin glargine  10 Units Subcutaneous QHS  . nicotine  7 mg Transdermal Daily  . simvastatin  40 mg Oral q1800   PRN:  HYDROcodone-acetaminophen  Diet:  Carb Control thin liquids Activity:  As tolerated DVT Prophylaxis:  Heparin 5000 units sq tid  CLINICALLY SIGNIFICANT STUDIES Basic Metabolic Panel:  Lab 06/24/12 9604 06/24/12 1331 06/24/12 1326  NA -- 140 139  K -- 4.1 4.1  CL -- 105 103   CO2 -- -- 23  GLUCOSE -- 219* 213*  BUN -- 18 17  CREATININE 0.67 1.00 --  CALCIUM -- -- 9.4  MG -- -- --  PHOS -- -- --   Liver Function Tests: No results found for this basename: AST:2,ALT:2,ALKPHOS:2,BILITOT:2,PROT:2,ALBUMIN:2 in the last 168 hours CBC:  Lab 06/24/12 1742 06/24/12 1331 06/24/12 1326  WBC 8.4 -- 8.3  NEUTROABS -- -- --  HGB 12.4 13.6 --  HCT 38.0 40.0 --  MCV 82.3 -- 82.5  PLT 199 -- 226   Coagulation:  Lab 06/24/12 1326  LABPROT 12.3  INR 0.92   Cardiac Enzymes:  Lab 06/24/12 1751  CKTOTAL --  CKMB --  CKMBINDEX --  TROPONINI <0.30   Urinalysis: No results found for this basename: COLORURINE:2,APPERANCEUR:2,LABSPEC:2,PHURINE:2,GLUCOSEU:2,HGBUR:2,BILIRUBINUR:2,KETONESUR:2,PROTEINUR:2,UROBILINOGEN:2,NITRITE:2,LEUKOCYTESUR:2 in the last 168 hours Lipid Panel    Component Value Date/Time   CHOL 189 06/25/2012 0600   TRIG 156* 06/25/2012 0600   HDL 39* 06/25/2012 0600   CHOLHDL 4.8 06/25/2012 0600   VLDL 31 06/25/2012 0600   LDLCALC 119* 06/25/2012 0600   HgbA1C  Lab Results  Component Value Date   HGBA1C 7.4* 06/24/2012    Urine Drug Screen:     Component Value Date/Time   LABOPIA NONE DETECTED 06/25/2012 0351   COCAINSCRNUR NONE DETECTED 06/25/2012  0351   LABBENZ NONE DETECTED 06/25/2012 0351   AMPHETMU NONE DETECTED 06/25/2012 0351   THCU NONE DETECTED 06/25/2012 0351   LABBARB NONE DETECTED 06/25/2012 0351    Alcohol Level: No results found for this basename: ETH:2 in the last 168 hours  CT of the brain  06/24/2012    1. No acute intracranial abnormality. 2.  Age advanced cerebral and cerebellar atrophy.   MRI of the brain  06/25/2012   3 mm acute infarction affecting the mesial temporal region / hippocampus on the right.  Punctate acute infarction in the right frontal white matter.  Neither shows swelling or hemorrhage.  Extensive chronic small vessel infarctions elsewhere throughout the brain  MRA of the brain  06/25/2012  No major vessel occlusion or correctable  proximal stenosis. Diffuse intracranial atherosclerotic disease with narrowing and irregularity of the medium small vessels diffusely.  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  Compared to the previous MR angiogram of 2013, there is no discernible change.   2D Echocardiogram    Carotid Doppler    CXR  06/07/2012 no acute finding  EKG  normal sinus rhythm.   Therapy Recommendations   Physical Exam   Awake alert. Afebrile. Head is nontraumatic. Neck is supple without bruit. Hearing is normal. Cardiac exam no murmur or gallop. Lungs are clear to auscultation. Distal pulses are well felt.  Neurological Exam :Awake  Alert oriented x 3. Normal speech and language. Diminished recall 0/3..eye movements full without nystagmus.Fundi not visualized..Vision acuity and fields are normal. Face symmetric. Tongue midline. Normal strength, tone, reflexes and coordination. Normal sensation but subjective paresthesias on left.. Gait deferred.  ASSESSMENT [name] is a 57 y.o. female presenting with HA, blurred vision and some left sided weakness. Imaging confirms a right hippocampal infarct as well as a right frontal lobe tiny infarct. Infarct felt to be embolic secondary to unknown etiology.  Loop recorder in past unrevealing for atrial fibrillation x 2 yrs. On clopidogrel 75 mg orally every day prior to admission. Now on clopidogrel 75 mg orally every day for secondary stroke prevention. Patient with resultant left hemiparesis. Work up underway.   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 Tobacco abuse  Hospital day # 1  TREATMENT/PLAN  Change plavix to  dipyridamole SR 250 mg/aspirin 25 mg orally  twice a day for secondary stroke prevention. To prevent headache, most common side effect of Aggrenox, will start Aggrenox q hs x 2 weeks then increase Aggrenox to bid.  Until then, aspirin 81 mg q am x 2 weeks, then discontinue. May take Tylenol 650 mg 1 hr prior to Aggrenox for the first week, then discontinue.  TEE to look for embolic source. Please arrange with pts cardiologist or cardiologist of choice. Will need to be NPO after midnight. If positive for PFO (patent foramen ovale), check bilateral lower extremity venous dopplers to rule out DVT as possible source of stroke.  Please schedule outpatient telemetry monitoring to assess patient for atrial fibrillation as source of stroke. May be arranged with patient's cardiologist, or cardiologist of choice.   Dr. Pearlean Brownie discussed with Dr. Mickle Plumb, MSN, RN, ANVP-BC, ANP-BC, GNP-BC [facility] Pager: [phone] 06/25/2012 10:53  AM  I have personally obtained a history, examined the patient, evaluated imaging results, and formulated the assessment and plan of care. I agree with the above.  Delia Heady, MD Medical Director [facility] Pager: [phone] 06/25/2012 5:28 PM

## 2012-06-25 NOTE — Progress Notes (Signed)
SLP Cancellation Note  Patient Details Name: [name] MRN: [id number] DOB: 10/01/1955   Cancelled treatment:       BSE ordered for pt, however pt has passed RN stroke swallow screen and is tolerating her current diet. MD gave verbal order to D/C swallow evaluation.    [name] 06/25/2012, 1:53 PM  Feliberto Gottron, MA, CCC-SLP [phone]

## 2012-06-25 NOTE — Progress Notes (Signed)
Inpatient Diabetes Program Recommendations  AACE/ADA: New Consensus Statement on Inpatient Glycemic Control (2013)  Target Ranges:  Prepandial:   less than 140 mg/dL      Peak postprandial:   less than 180 mg/dL (1-2 hours)      Critically ill patients:  140 - 180 mg/dL   Inpatient Diabetes Program Recommendations Correction (SSI): start Novolog sensitive scale TID HgbA1C: =7.4 Thank you  Piedad Climes BSN, RN,CDE Inpatient Diabetes Coordinator [phone] (team pager)

## 2012-06-25 NOTE — Evaluation (Signed)
Physical Therapy Evaluation Patient Details Name: [name] MRN: [id number] DOB: Mar 07, 1956 Today's Date: 06/25/2012 Time: 7829-5621 PT Time Calculation (min): 18 min  PT Assessment / Plan / Recommendation Clinical Impression  pt presents with R/O CVA.  pt indicating that she is starting to feel back to normal today.  pt does note feeling a little weak, but otherwise feels that she can resume ModI at home.  Will f/u with pt for one more visit to ensure safety and return to baseline.  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      PT Assessment  Patient needs continued PT services    Follow Up Recommendations  Outpatient PT    Does the patient have the potential to tolerate intense rehabilitation      Barriers to Discharge None      Equipment Recommendations  None recommended by PT    Recommendations for Other Services     Frequency  (1 more visit)    Precautions / Restrictions Precautions Precautions: None Restrictions Weight Bearing Restrictions: No   Pertinent Vitals/Pain Indicates soreness in residual limb.        Mobility  Bed Mobility Bed Mobility: Supine to Sit;Sitting - Scoot to Edge of Bed Supine to Sit: 6: Modified independent (Device/Increase time);With rails Sitting - Scoot to Edge of Bed: 6: Modified independent (Device/Increase time) Details for Bed Mobility Assistance: demos good technique Transfers Transfers: Sit to Stand;Stand to Sit;Stand Pivot Transfers Sit to Stand: 5: Supervision;With upper extremity assist;From bed Stand to Sit: 5: Supervision;With upper extremity assist;To chair/3-in-1 Stand Pivot Transfers: 5: Supervision;With armrests Details for Transfer Assistance: pt requires A to set up chair and IV pole, but no physical A needed for SPT.  pt able to use armrests on recliner for A.   Ambulation/Gait Ambulation/Gait Assistance: Not tested (comment) Stairs: No Wheelchair  Mobility Wheelchair Mobility: No Modified Rankin (Stroke Patients Only) Pre-Morbid Rankin Score: Slight disability Modified Rankin: Slight disability    Exercises     PT Diagnosis: Generalized weakness  PT Problem List: Decreased activity tolerance;Decreased balance;Decreased mobility PT Treatment Interventions: Functional mobility training;Therapeutic activities;Therapeutic exercise;Balance training;Patient/family education;Wheelchair mobility training   PT Goals Acute Rehab PT Goals PT Goal Formulation: With patient Time For Goal Achievement: 07/02/12 Potential to Achieve Goals: Good Pt will go Sit to Stand: with modified independence PT Goal: Sit to Stand - Progress: Goal set today Pt will go Stand to Sit: with modified independence PT Goal: Stand to Sit - Progress: Goal set today Pt will Transfer Bed to Chair/Chair to Bed: with modified independence PT Transfer Goal: Bed to Chair/Chair to Bed - Progress: Goal set today  Visit Information  Last PT Received On: 06/25/12 Assistance Needed: +1    Subjective Data  Subjective: I'm starting to feel back to normal.   Patient Stated Goal: Home   Prior Functioning  Home Living Lives With: Alone Available Help at Discharge: Family;Available PRN/intermittently Type of Home: House Home Access: Ramped entrance Home Layout: One level Bathroom Shower/Tub: Engineer, manufacturing systems: Standard Bathroom Accessibility: Yes How Accessible: Accessible via walker Home Adaptive Equipment: Bedside commode/3-in-1;Shower chair with back;Walker - rolling;Straight cane;Wheelchair - manual Prior Function Level of Independence: Independent with assistive device(s) Able to Take Stairs?: No Driving: No Comments: pt uses W/C to be ModI around house and occasionally uses RW.  pt's family drives her to appointments and runs errands for her.   Communication Communication: No difficulties  Cognition  Cognition Overall Cognitive Status:  Appears within functional limits for tasks assessed/performed Arousal/Alertness: Awake/alert Orientation Level: Appears intact for tasks assessed Behavior During Session: [facility] for tasks performed    Extremity/Trunk Assessment Right Lower Extremity Assessment RLE ROM/Strength/Tone: [facility] for tasks assessed RLE Sensation: [facility] - Light Touch Left Lower Extremity Assessment LLE ROM/Strength/Tone: Deficits LLE ROM/Strength/Tone Deficits: 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  knee and hip WFL.   LLE Sensation: Deficits LLE Sensation Deficits: Phantom sensations sense BKA.   Trunk Assessment Trunk Assessment: Normal   Balance Balance Balance Assessed: Yes Dynamic Standing Balance Dynamic Standing - Balance Support: Bilateral upper extremity supported;During functional activity Dynamic Standing - Level of Assistance: 5: Stand by assistance  End of Session PT - End of Session Activity Tolerance: Patient tolerated treatment well Patient left: in chair;with call bell/phone within reach;with nursing in room Nurse Communication: Mobility status  GP     Sunny Schlein, [place] [phone] 06/25/2012, 12:10 PM

## 2012-06-26 DIAGNOSIS — M7989 Other specified soft tissue disorders: Secondary | ICD-10-CM

## 2012-06-26 DIAGNOSIS — I6350 Cerebral infarction due to unspecified occlusion or stenosis of unspecified cerebral artery: Secondary | ICD-10-CM

## 2012-06-26 LAB — GLUCOSE, CAPILLARY
Glucose-Capillary: 125 mg/dL — ABNORMAL HIGH (ref 70–99)
Glucose-Capillary: 141 mg/dL — ABNORMAL HIGH (ref 70–99)

## 2012-06-26 LAB — D-DIMER, QUANTITATIVE: D-Dimer, Quant: 0.39 ug/mL-FEU (ref 0.00–0.48)

## 2012-06-26 MED ORDER — ASPIRIN-DIPYRIDAMOLE ER 25-200 MG PO CP12: 1.0000 | ORAL_CAPSULE | Freq: Every day | ORAL | Status: DC

## 2012-06-26 MED ORDER — ASPIRIN 81 MG PO CHEW: 81.0000 mg | CHEWABLE_TABLET | Freq: Every day | ORAL | Status: DC

## 2012-06-26 MED ORDER — NICOTINE 7 MG/24HR TD PT24: 1.0000 | MEDICATED_PATCH | TRANSDERMAL | Status: DC

## 2012-06-26 NOTE — Progress Notes (Signed)
Stroke Team Progress Note  HISTORY [name] is an 57 y.o. female who was at work 06/24/2012 when she noted sudden onset HA, blurred vision and some left sided weakness. EMS was called and patient was brought to [facility] ED asa code stroke. At time of arrival to ED patient showed a mild drift in left arm and continued to complain about blurred vision however her symptoms were improving. Initial CT head showed no acute infarct. Looking back in the hospital charts patient had recently been seen on 05/26/12 for similar symptoms. Code stroke was canceled due to resolving symptoms. Patient was not a TPA candidate secondary to improving symptoms. She was admitted for further evaluation and treatment.  SUBJECTIVE The patient is alone in a room sitting in a chair in no acute distress. The plan is for discharge later today. She states her nephew will be coming in to pick her up.   OBJECTIVE Most recent Vital Signs: Filed Vitals:   06/25/12 1849 06/25/12 2137 06/26/12 0200 06/26/12 0602  BP: 165/72 186/80 183/85 177/92  Pulse: 83 75 74 79  Temp: 98 F (36.7 C) 97.6 F (36.4 C)  98.1 F (36.7 C)  TempSrc:  Oral  Oral  Resp: 20 16 18 16   Height:      Weight:      SpO2: 100% 100% 100% 99%   CBG (last 3)   Recent Labs  06/25/12 1633 06/25/12 2136 06/26/12 0745  GLUCAP 223* 175* 125*   IV Fluid Intake:   . sodium chloride 100 mL/hr at 06/24/12 1433  . sodium chloride 100 mL/hr at 06/24/12 1753   MEDICATIONS  . aspirin  81 mg Oral Daily  . dipyridamole-aspirin  1 capsule Oral QHS  . heparin  5,000 Units Subcutaneous Q8H  . insulin glargine  10 Units Subcutaneous QHS  . nicotine  7 mg Transdermal Daily  . simvastatin  40 mg Oral q1800   PRN:  HYDROcodone-acetaminophen  Diet:  Carb Control thin liquids Activity:  As tolerated DVT Prophylaxis:  Heparin 5000 units sq tid  CLINICALLY SIGNIFICANT STUDIES Basic Metabolic Panel:   Recent Labs Lab 06/24/12 1326 06/24/12 1331  06/24/12 1742  NA 139 140  --   K 4.1 4.1  --   CL 103 105  --   CO2 23  --   --   GLUCOSE 213* 219*  --   BUN 17 18  --   CREATININE 0.62 1.00 0.67  CALCIUM 9.4  --   --    Liver Function Tests: No results found for this basename: AST, ALT, ALKPHOS, BILITOT, PROT, ALBUMIN,  in the last 168 hours CBC:   Recent Labs Lab 06/24/12 1326 06/24/12 1331 06/24/12 1742  WBC 8.3  --  8.4  HGB 13.3 13.6 12.4  HCT 40.9 40.0 38.0  MCV 82.5  --  82.3  PLT 226  --  199   Coagulation:   Recent Labs Lab 06/24/12 1326  LABPROT 12.3  INR 0.92   Cardiac Enzymes:   Recent Labs Lab 06/24/12 1751  TROPONINI <0.30   Urinalysis: No results found for this basename: COLORURINE, APPERANCEUR, LABSPEC, PHURINE, GLUCOSEU, HGBUR, BILIRUBINUR, KETONESUR, PROTEINUR, UROBILINOGEN, NITRITE, LEUKOCYTESUR,  in the last 168 hours Lipid Panel    Component Value Date/Time   CHOL 189 06/25/2012 0600   TRIG 156* 06/25/2012 0600   HDL 39* 06/25/2012 0600   CHOLHDL 4.8 06/25/2012 0600   VLDL 31 06/25/2012 0600   LDLCALC 119* 06/25/2012 0600   HgbA1C  Lab Results  Component Value Date   HGBA1C 7.4* 06/24/2012    Urine Drug Screen:     Component Value Date/Time   LABOPIA NONE DETECTED 06/25/2012 0351   COCAINSCRNUR NONE DETECTED 06/25/2012 0351   LABBENZ NONE DETECTED 06/25/2012 0351   AMPHETMU NONE DETECTED 06/25/2012 0351   THCU NONE DETECTED 06/25/2012 0351   LABBARB NONE DETECTED 06/25/2012 0351    Alcohol Level: No results found for this basename: ETH,  in the last 168 hours  CT of the brain  06/24/2012    1. No acute intracranial abnormality. 2.  Age advanced cerebral and cerebellar atrophy.   MRI of the brain  06/25/2012   3 mm acute infarction affecting the mesial temporal region / hippocampus on the right.  Punctate acute infarction in the right frontal white matter.  Neither shows swelling or hemorrhage.  Extensive chronic small vessel infarctions elsewhere throughout the brain  MRA of the brain  06/25/2012  No  major vessel occlusion or correctable proximal stenosis. Diffuse intracranial atherosclerotic disease with narrowing and irregularity of the medium small vessels diffusely.  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  Compared to the previous MR angiogram of 2013, there is no discernible change.   2D Echocardiogram  - canceled - performed 01/08/2012  Carotid Doppler  - canceled - normal study performed 12/16/2011  CXR  06/07/2012 no acute finding  EKG  normal sinus rhythm.   Therapy Recommendations - Outpatient therapy recommended. No further occupational therapy was recommended.  Physical Exam   Awake alert. Afebrile. Head is nontraumatic. Neck is supple without bruit. Hearing is normal. Cardiac exam no murmur or gallop. Lungs are clear to auscultation. Distal pulses are well felt.  Neurological Exam :Awake  Alert oriented x 3. Normal speech and language. Diminished recall 0/3..eye movements full without nystagmus.Fundi not visualized..Vision acuity and fields are normal. Face symmetric. Tongue midline. Normal strength, tone, reflexes and coordination. Normal sensation but subjective paresthesias on left.. Gait deferred.   General - this is a pleasant 57 year old female in no acute distress Heart - Regular rate and rhythm - no murmer Lungs - Clear to auscultation Abdomen - Soft - non tender Skin - Warm and dry   ASSESSMENT [name] is a 57 y.o. female presenting with HA, blurred vision and some left sided weakness. Imaging confirms a right hippocampal infarct as well as a right frontal lobe tiny infarct. Infarct felt to be embolic secondary to unknown etiology.  Loop recorder in past unrevealing for atrial fibrillation x 2 yrs. On clopidogrel 75 mg orally every day prior to admission. Now on Aggrenox at bedtime and aspirin 81 mg each morning for secondary stroke prevention. Patient with resultant left hemiparesis. Work up  underway.   Hyperlipidemia, LDL 119, on statin PTA, on statin now, goal LDL < 100  Diabetes, HgbA1c 7.4  No CVD per cardiology note.   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 Tobacco abuse  Hospital day # 2  TREATMENT/PLAN  Plavix changed to  dipyridamole SR 250 mg/aspirin 25 mg orally twice a day for secondary stroke prevention. To prevent headache, most common side effect of Aggrenox, will start Aggrenox q hs x 2 weeks then increase Aggrenox to bid.  Until then, aspirin 81 mg q am x 2 weeks, then discontinue. May take Tylenol 650 mg 1 hr prior to Aggrenox  for the first week, then discontinue.  TEE to look for embolic source  to be performed on an outpatient basis with Dr. Patty Sermons. Please schedule outpatient telemetry monitoring to assess patient for atrial fibrillation as source of stroke. Probable discharge later today. Lower extremity Dopplers pending.  Dr. Pearlean Brownie discussed with Dr. Susie Cassette - she is to follow up with Dr. Cassell Clement for scheduling of the TEE. She will also need outpatient telemetry monitoring to rule out atrial fibrillation as a source of her stroke.  Delton See PA-C [facility] Pager [phone] 06/26/2012, 9:55 AM  I have personally obtained a history, examined the patient, evaluated imaging results, and formulated the assessment and plan of care. I agree with the above.    Lesly Dukes

## 2012-06-26 NOTE — Progress Notes (Signed)
VASCULAR LAB PRELIMINARY  PRELIMINARY  PRELIMINARY  PRELIMINARY  Bilateral lower extremity venous Dopplers completed.    Preliminary report:  There is no obvious DVT or SVT noted in the bilateral lower extremities.  Jubal Rademaker, RVT 06/26/2012, 11:36 AM

## 2012-06-26 NOTE — Progress Notes (Signed)
Late Entry: Downtime- Patient's BP elevated at 0200. NP, Daphane Shepherd notified. No new orders received. Will continue to monitor.

## 2012-06-26 NOTE — Progress Notes (Signed)
Physical Therapy Treatment Patient Details Name: JAQUANA GEIGER MRN: [id number] DOB: Dec 01, 1955 Today's Date: 06/26/2012 Time: 7829-5621 PT Time Calculation (min): 14 min  PT Assessment / Plan / Recommendation Comments on Treatment Session  Pt moving well.  Slightly unsteady with SPT using RW but no increased physical (A) needed;  pt reports this is baseline for her.  Pt can continue bed>recliner/w/c transfers with Nsing.   PT services now signing off.      Follow Up Recommendations  Outpatient PT     Does the patient have the potential to tolerate intense rehabilitation     Barriers to Discharge        Equipment Recommendations  None recommended by PT    Recommendations for Other Services    Frequency     Plan Discharge plan remains appropriate    Precautions / Restrictions Precautions Precautions: Fall Restrictions Weight Bearing Restrictions: No       Mobility  Bed Mobility Bed Mobility: Not assessed Transfers Transfers: Sit to Stand;Stand to Sit;Stand Pivot Transfers Sit to Stand: 6: Modified independent (Device/Increase time);With upper extremity assist;With armrests;From bed;From chair/3-in-1 Stand to Sit: 5: Supervision;With upper extremity assist;With armrests;To bed;To chair/3-in-1 Stand Pivot Transfers: 4: Min guard Details for Transfer Assistance: Cues for hand placement.  Pt mildly unsteady with stand pivot but no physical (A) to correct-- cues to ensure balance before taking pivotal hop.   Pt reports she feels this is baseline for her.   Ambulation/Gait Ambulation/Gait Assistance: Not tested (comment) Stairs: No Wheelchair Mobility Wheelchair Mobility: No Modified Rankin (Stroke Patients Only) Pre-Morbid Rankin Score: Slight disability Modified Rankin: Slight disability      PT Goals Acute Rehab PT Goals Time For Goal Achievement: 07/02/12 Potential to Achieve Goals: Good Pt will go Sit to Stand: with modified independence PT Goal: Sit to Stand -  Progress: Met Pt will go Stand to Sit: with modified independence PT Goal: Stand to Sit - Progress: Progressing toward goal Pt will Transfer Bed to Chair/Chair to Bed: with modified independence PT Transfer Goal: Bed to Chair/Chair to Bed - Progress: Not met  Visit Information  Last PT Received On: 06/26/12 Assistance Needed: +1    Subjective Data      Cognition  Cognition Overall Cognitive Status: Appears within functional limits for tasks assessed/performed Arousal/Alertness: Awake/alert Orientation Level: Appears intact for tasks assessed Behavior During Session: [facility] for tasks performed    Balance     End of Session PT - End of Session Activity Tolerance: Patient tolerated treatment well Patient left: in chair;with call bell/phone within reach Nurse Communication: Mobility status     Verdell Face, [place] [phone] 06/26/2012

## 2012-06-26 NOTE — Progress Notes (Signed)
Pt  D/C instruction and AVS given with her prescriptions. Pt demonstrated good understanding  With a teach back of listing her 3 risk factors and how to control them. Also reminded with her follow up appointments.  Condition at discharge is stable

## 2012-06-26 NOTE — Discharge Summary (Addendum)
Physician Discharge Summary  [name] MRN: [id number] DOB/AGE: [birth year] 58 y.o.  PCP: [name], MD   Admit date: 06/24/2012 Discharge date: 06/26/2012  Discharge Diagnoses:     acute CVA  Active Problems:   HYPERLIPIDEMIA   HYPERTENSION   SYNCOPE   DM type 2, uncontrolled, with neuropathy   H/O   Numbness   Tobacco abuse     Medication List    STOP taking these medications       clopidogrel 75 MG tablet  Commonly known as:  PLAVIX      TAKE these medications       acetaminophen 500 MG tablet  Commonly known as:  TYLENOL  Take 1,000 mg by mouth every 6 (six) hours as needed. For pain     aspirin 81 MG chewable tablet  Chew 1 tablet (81 mg total) by mouth daily.     dipyridamole-aspirin 200-25 MG per 12 hr capsule  Commonly known as:  AGGRENOX  Take 1 capsule by mouth at bedtime.     glimepiride 4 MG tablet  Commonly known as:  AMARYL  Take 4 mg by mouth daily.     hydrochlorothiazide 25 MG tablet  Commonly known as:  HYDRODIURIL  Take 25 mg by mouth daily.     insulin aspart 100 UNIT/ML injection  Commonly known as:  novoLOG  Inject 6 Units into the skin 3 (three) times daily before meals.     insulin glargine 100 UNIT/ML injection  Commonly known as:  LANTUS  Inject 20 Units into the skin at bedtime.     lisinopril 20 MG tablet  Commonly known as:  PRINIVIL,ZESTRIL  Take 20 mg by mouth daily.     metFORMIN 500 MG tablet  Commonly known as:  GLUCOPHAGE  Take 250 mg by mouth 2 (two) times daily with a meal.     methocarbamol 500 MG tablet  Commonly known as:  ROBAXIN  Take 500 mg by mouth 2 (two) times daily as needed. For muscle spasms     nicotine 7 mg/24hr patch  Commonly known as:  NICODERM CQ - dosed in mg/24 hr  Place 1 patch onto the skin daily.     simvastatin 40 MG tablet  Commonly known as:  ZOCOR  Take 40 mg by mouth every evening.        Discharge Condition: Stable   Disposition: 01-Home or Self  Care   Consults:  Neurology  Significant Diagnostic Studies: Dg Chest 2 View  06/07/2012  *RADIOLOGY REPORT*  Clinical Data: Chest pain and dizziness.  CHEST - 2 VIEW  Comparison: 02/09/2012.  Findings: Trachea is midline.  Heart size stable.  Lungs are clear. No pleural fluid.  Degenerative changes are seen in the spine.  IMPRESSION: No acute findings.   Original Report Authenticated By: Leanna Battles, M.D.    Ct Head Wo Contrast  06/24/2012  *RADIOLOGY REPORT*  Clinical Data: Left-sided weakness.  History of CVA, obesity, hypertension, hyperlipidemia.  CT HEAD WITHOUT CONTRAST  Technique:  Contiguous axial images were obtained from the base of the skull through the vertex without contrast.  Comparison: 05/26/2012 and brain MR of 01/07/2012.  Findings: Bone windows demonstrate clear paranasal sinuses and mastoid air cells.  Soft tissue windows demonstrate age advanced cerebral atrophy. Cerebellar atrophy as well.  Dilated perivascular space versus remote lacunar infarct in the left basil ganglia on image 14/series 2.  Mild low density in the periventricular white matter likely related to small vessel disease.  No  mass lesion, hemorrhage, hydrocephalus, acute infarct, intra-axial, or extra-axial fluid collection.  IMPRESSION:  1. No acute intracranial abnormality. 2.  Age advanced cerebral and cerebellar atrophy.  Findings called to Dr. Roseanne Reno at 1:10 p.m.   Original Report Authenticated By: Jeronimo Greaves, M.D.    Ct Angio Chest W/cm &/or Wo Cm  06/07/2012  *RADIOLOGY REPORT*  Clinical Data: Severe chest pain.  CT ANGIOGRAPHY CHEST  Technique:  Multidetector CT imaging of the chest using the standard protocol during bolus administration of intravenous contrast. Multiplanar reconstructed images including MIPs were obtained and reviewed to evaluate the vascular anatomy.  Contrast: OMNIPAQUE IOHEXOL 350 MG/ML SOLN  Comparison: Chest radiograph 06/08/2011 and CT chest 01/08/2012.  Findings: Negative  for pulmonary embolus.  No pathologically enlarged mediastinal, hilar or axillary lymph nodes.  Coronary artery calcification.  Heart is mildly enlarged.  No pericardial effusion.  Lungs are clear.  No pleural fluid.  Airway is unremarkable.  Incidental imaging of the upper abdomen shows no acute findings. No worrisome lytic or sclerotic lesions.  Degenerative changes are seen in the spine.  IMPRESSION:  1.  Negative for pulmonary embolus. 2.  Coronary artery calcification.   Original Report Authenticated By: Leanna Battles, M.D.    Mri Brain Without Contrast  06/25/2012  *RADIOLOGY REPORT*  Clinical Data:  Left-sided weakness  MRI HEAD WITHOUT CONTRAST MRA HEAD WITHOUT CONTRAST  Technique:  Multiplanar, multiecho pulse sequences of the brain and surrounding structures were obtained without intravenous contrast. Angiographic images of the head were obtained using MRA technique without contrast.  Comparison:  Head CT 06/24/2012.  MRI 01/07/2012.  MRI HEAD  Findings:  There is a 3 mm focus of restricted diffusion in the mesial temporal lobes / hippocampal region on the right consistent with a small acute infarction.  There is a punctate focus of restricted diffusion in the right frontal white matter also consistent with a small infarction.  No large vessel territory insult.  There are chronic small vessel changes affecting the pons.  There are a few old small vessel cerebellar infarctions.  There are old lacunar infarctions affecting the thalami and basal ganglia.  There are chronic small vessel insults throughout the hemispheric white matter. No mass lesion, hemorrhage, hydrocephalus or extra-axial collection.  No pituitary mass.  No fluid in the sinuses, middle ears or mastoids.  No skull or skull base lesion.  IMPRESSION: 3 mm acute infarction affecting the mesial temporal region / hippocampus on the right.  Punctate acute infarction in the right frontal white matter.  Neither shows swelling or hemorrhage.   Extensive chronic small vessel infarctions elsewhere throughout the brain as outlined above.  MRA HEAD  Findings: Both internal carotid arteries are widely patent into the brain.  There is mild narrowing in the siphon regions but no flow- limiting focal stenosis.  The anterior middle cerebral vessels are patent without proximal stenosis.  More distal branch vessels atherosclerotic irregularity. There are large posterior communicating arteries bilaterally.  These give the majority supply to the posterior cerebral arteries.  Both vertebral arteries are patent to the basilar with the left being dominant.  There is mild atherosclerotic irregularity of the distal vertebral arteries but no focal stenosis.  The basilar artery shows atherosclerotic irregularity with narrowing of the proximal 1 cm of the vessel to a diameter of 50%.  There is flow in both superior cerebellar and posterior cerebral arteries, with there being rather considerable atherosclerotic narrowing in the branches.  IMPRESSION: No major vessel  occlusion or correctable proximal stenosis. Diffuse intracranial atherosclerotic disease with narrowing and irregularity of the medium small vessels diffusely.  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  Compared to the previous MR angiogram of 2013, there is no discernible change.   Original Report Authenticated By: Paulina Fusi, M.D.    Mr Mra Head/brain Wo Cm  06/25/2012  *RADIOLOGY REPORT*  Clinical Data:  Left-sided weakness  MRI HEAD WITHOUT CONTRAST MRA HEAD WITHOUT CONTRAST  Technique:  Multiplanar, multiecho pulse sequences of the brain and surrounding structures were obtained without intravenous contrast. Angiographic images of the head were obtained using MRA technique without contrast.  Comparison:  Head CT 06/24/2012.  MRI 01/07/2012.  MRI HEAD  Findings:  There is a 3 mm focus of restricted diffusion in the mesial temporal lobes / hippocampal region on the right  consistent with a small acute infarction.  There is a punctate focus of restricted diffusion in the right frontal white matter also consistent with a small infarction.  No large vessel territory insult.  There are chronic small vessel changes affecting the pons.  There are a few old small vessel cerebellar infarctions.  There are old lacunar infarctions affecting the thalami and basal ganglia.  There are chronic small vessel insults throughout the hemispheric white matter. No mass lesion, hemorrhage, hydrocephalus or extra-axial collection.  No pituitary mass.  No fluid in the sinuses, middle ears or mastoids.  No skull or skull base lesion.  IMPRESSION: 3 mm acute infarction affecting the mesial temporal region / hippocampus on the right.  Punctate acute infarction in the right frontal white matter.  Neither shows swelling or hemorrhage.  Extensive chronic small vessel infarctions elsewhere throughout the brain as outlined above.  MRA HEAD  Findings: Both internal carotid arteries are widely patent into the brain.  There is mild narrowing in the siphon regions but no flow- limiting focal stenosis.  The anterior middle cerebral vessels are patent without proximal stenosis.  More distal branch vessels atherosclerotic irregularity. There are large posterior communicating arteries bilaterally.  These give the majority supply to the posterior cerebral arteries.  Both vertebral arteries are patent to the basilar with the left being dominant.  There is mild atherosclerotic irregularity of the distal vertebral arteries but no focal stenosis.  The basilar artery shows atherosclerotic irregularity with narrowing of the proximal 1 cm of the vessel to a diameter of 50%.  There is flow in both superior cerebellar and posterior cerebral arteries, with there being rather considerable atherosclerotic narrowing in the branches.  IMPRESSION: No major vessel occlusion or correctable proximal stenosis. Diffuse intracranial  atherosclerotic disease with narrowing and irregularity of the medium small vessels diffusely.  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  Compared to the previous MR angiogram of 2013, there is no discernible change.   Original Report Authenticated By: Paulina Fusi, M.D.      TEE pending    Microbiology: No results found for this or any previous visit (from the past 240 hour(s)).   Labs: Results for orders placed during the hospital encounter of 06/24/12 (from the past 48 hour(s))  CBC     Status: Abnormal   Collection Time    06/24/12  1:26 PM      Result Value Range   WBC 8.3  4.0 - 10.5 K/uL   RBC 4.96  3.87 - 5.11 MIL/uL   Hemoglobin 13.3  12.0 - 15.0  g/dL   HCT 16.1  09.6 - 04.5 %   MCV 82.5  78.0 - 100.0 fL   MCH 26.8  26.0 - 34.0 pg   MCHC 32.5  30.0 - 36.0 g/dL   RDW 40.9 (*) 81.1 - 91.4 %   Platelets 226  150 - 400 K/uL  BASIC METABOLIC PANEL     Status: Abnormal   Collection Time    06/24/12  1:26 PM      Result Value Range   Sodium 139  135 - 145 mEq/L   Potassium 4.1  3.5 - 5.1 mEq/L   Chloride 103  96 - 112 mEq/L   CO2 23  19 - 32 mEq/L   Glucose, Bld 213 (*) 70 - 99 mg/dL   BUN 17  6 - 23 mg/dL   Creatinine, Ser 7.82  0.50 - 1.10 mg/dL   Calcium 9.4  8.4 - 95.6 mg/dL   GFR calc non Af Amer >90  >90 mL/min   GFR calc Af Amer >90  >90 mL/min   Comment:            The eGFR has been calculated     using the CKD EPI equation.     This calculation has not been     validated in all clinical     situations.     eGFR's persistently     <90 mL/min signify     possible Chronic Kidney Disease.  PROTIME-INR     Status: None   Collection Time    06/24/12  1:26 PM      Result Value Range   Prothrombin Time 12.3  11.6 - 15.2 seconds   INR 0.92  0.00 - 1.49  APTT     Status: Abnormal   Collection Time    06/24/12  1:26 PM      Result Value Range   aPTT 21 (*) 24 - 37 seconds  POCT I-STAT, CHEM 8     Status: Abnormal    Collection Time    06/24/12  1:31 PM      Result Value Range   Sodium 140  135 - 145 mEq/L   Potassium 4.1  3.5 - 5.1 mEq/L   Chloride 105  96 - 112 mEq/L   BUN 18  6 - 23 mg/dL   Creatinine, Ser 2.13  0.50 - 1.10 mg/dL   Glucose, Bld 086 (*) 70 - 99 mg/dL   Calcium, Ion 5.78 (*) 1.12 - 1.23 mmol/L   TCO2 28  0 - 100 mmol/L   Hemoglobin 13.6  12.0 - 15.0 g/dL   HCT 46.9  62.9 - 52.8 %  GLUCOSE, CAPILLARY     Status: Abnormal   Collection Time    06/24/12  5:24 PM      Result Value Range   Glucose-Capillary 114 (*) 70 - 99 mg/dL  HEMOGLOBIN U1L     Status: Abnormal   Collection Time    06/24/12  5:42 PM      Result Value Range   Hemoglobin A1C 7.4 (*) <5.7 %   Comment: (NOTE)  According to the ADA Clinical Practice Recommendations for 2011, when     HbA1c is used as a screening test:      >=6.5%   Diagnostic of Diabetes Mellitus               (if abnormal result is confirmed)     5.7-6.4%   Increased risk of developing Diabetes Mellitus     References:Diagnosis and Classification of Diabetes Mellitus,Diabetes     Care,2011,34(Suppl 1):S62-S69 and Standards of Medical Care in             Diabetes - 2011,Diabetes Care,2011,34 (Suppl 1):S11-S61.   Mean Plasma Glucose 166 (*) <117 mg/dL  CBC     Status: Abnormal   Collection Time    06/24/12  5:42 PM      Result Value Range   WBC 8.4  4.0 - 10.5 K/uL   RBC 4.62  3.87 - 5.11 MIL/uL   Hemoglobin 12.4  12.0 - 15.0 g/dL   HCT 16.1  09.6 - 04.5 %   MCV 82.3  78.0 - 100.0 fL   MCH 26.8  26.0 - 34.0 pg   MCHC 32.6  30.0 - 36.0 g/dL   RDW 40.9 (*) 81.1 - 91.4 %   Platelets 199  150 - 400 K/uL  CREATININE, SERUM     Status: None   Collection Time    06/24/12  5:42 PM      Result Value Range   Creatinine, Ser 0.67  0.50 - 1.10 mg/dL   GFR calc non Af Amer >90  >90 mL/min   GFR calc Af Amer >90  >90 mL/min   Comment:            The eGFR has been  calculated     using the CKD EPI equation.     This calculation has not been     validated in all clinical     situations.     eGFR's persistently     <90 mL/min signify     possible Chronic Kidney Disease.  TROPONIN I     Status: None   Collection Time    06/24/12  5:51 PM      Result Value Range   Troponin I <0.30  <0.30 ng/mL   Comment:            Due to the release kinetics of cTnI,     a negative result within the first hours     of the onset of symptoms does not rule out     myocardial infarction with certainty.     If myocardial infarction is still suspected,     repeat the test at appropriate intervals.  GLUCOSE, CAPILLARY     Status: Abnormal   Collection Time    06/24/12  9:42 PM      Result Value Range   Glucose-Capillary 66 (*) 70 - 99 mg/dL   Comment 1 Documented in Chart     Comment 2 Notify RN    GLUCOSE, CAPILLARY     Status: None   Collection Time    06/24/12 10:09 PM      Result Value Range   Glucose-Capillary 85  70 - 99 mg/dL   Comment 1 Documented in Chart     Comment 2 Notify RN    URINE RAPID DRUG SCREEN (HOSP PERFORMED)     Status: None   Collection Time    06/25/12  3:51 AM      Result Value  Range   Opiates NONE DETECTED  NONE DETECTED   Cocaine NONE DETECTED  NONE DETECTED   Benzodiazepines NONE DETECTED  NONE DETECTED   Amphetamines NONE DETECTED  NONE DETECTED   Tetrahydrocannabinol NONE DETECTED  NONE DETECTED   Barbiturates NONE DETECTED  NONE DETECTED   Comment:            DRUG SCREEN FOR MEDICAL PURPOSES     ONLY.  IF CONFIRMATION IS NEEDED     FOR ANY PURPOSE, NOTIFY LAB     WITHIN 5 DAYS.                LOWEST DETECTABLE LIMITS     FOR URINE DRUG SCREEN     Drug Class       Cutoff (ng/mL)     Amphetamine      1000     Barbiturate      200     Benzodiazepine   200     Tricyclics       300     Opiates          300     Cocaine          300     THC              50  LIPID PANEL     Status: Abnormal   Collection Time     06/25/12  6:00 AM      Result Value Range   Cholesterol 189  0 - 200 mg/dL   Triglycerides 409 (*) <150 mg/dL   HDL 39 (*) >81 mg/dL   Total CHOL/HDL Ratio 4.8     VLDL 31  0 - 40 mg/dL   LDL Cholesterol 191 (*) 0 - 99 mg/dL   Comment:            Total Cholesterol/HDL:CHD Risk     Coronary Heart Disease Risk Table                         Men   Women      1/2 Average Risk   3.4   3.3      Average Risk       5.0   4.4      2 X Average Risk   9.6   7.1      3 X Average Risk  23.4   11.0                Use the calculated Patient Ratio     above and the CHD Risk Table     to determine the patient's CHD Risk.                ATP III CLASSIFICATION (LDL):      <100     mg/dL   Optimal      478-295  mg/dL   Near or Above                        Optimal      130-159  mg/dL   Borderline      621-308  mg/dL   High      >657     mg/dL   Very High  GLUCOSE, CAPILLARY     Status: Abnormal   Collection Time    06/25/12  7:31 AM      Result Value Range   Glucose-Capillary 119 (*) 70 -  99 mg/dL  GLUCOSE, CAPILLARY     Status: Abnormal   Collection Time    06/25/12 11:48 AM      Result Value Range   Glucose-Capillary 120 (*) 70 - 99 mg/dL   Comment 1 Notify RN     Comment 2 Documented in Chart    GLUCOSE, CAPILLARY     Status: Abnormal   Collection Time    06/25/12  4:33 PM      Result Value Range   Glucose-Capillary 223 (*) 70 - 99 mg/dL   Comment 1 Documented in Chart     Comment 2 Notify RN    GLUCOSE, CAPILLARY     Status: Abnormal   Collection Time    06/25/12  9:36 PM      Result Value Range   Glucose-Capillary 175 (*) 70 - 99 mg/dL   Comment 1 Documented in Chart     Comment 2 Notify RN    GLUCOSE, CAPILLARY     Status: Abnormal   Collection Time    06/26/12  7:45 AM      Result Value Range   Glucose-Capillary 125 (*) 70 - 99 mg/dL   Comment 1 Documented in Chart     Comment 2 Notify RN       HPI : [name] is an 57 y.o. female who was at work 06/24/2012 when  she noted sudden onset HA, blurred vision and some left sided weakness. EMS was called and patient was brought to [facility] ED asa code stroke. At time of arrival to ED patient showed a mild drift in left arm and continued to complain about blurred vision however her symptoms were improving. Initial CT head showed no acute infarct. Looking back in the hospital charts patient had recently been seen on 05/26/12 for similar symptoms. Code stroke was canceled due to resolving symptoms. Patient was not a TPA candidate secondary to improving symptoms. She was admitted for further evaluation and treatment.   HOSPITAL COURSE:  #1 acute CVA [name] is a 57 y.o. female presenting with HA, blurred vision and some left sided weakness. Imaging confirms a right hippocampal infarct as well as a right frontal lobe tiny infarct. Infarct felt to be embolic secondary to unknown etiology. Loop recorder in past unrevealing for atrial fibrillation x 2 yrs. On clopidogrel 75 mg orally every day prior to admission. Patient with resultant left hemiparesis. 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  The patient was seen by neurology. They recommend Change plavix to dipyridamole SR 250 mg/aspirin 25 mg orally twice a day for secondary stroke prevention. To prevent headache, most common side effect of Aggrenox, will start Aggrenox q hs x 2 weeks then increase Aggrenox to bid. Until then, aspirin 81 mg q am x 2 weeks, then discontinue. May take Tylenol 650 mg 1 hr prior to Aggrenox for the first week, then discontinue.  TEE to look for embolic source. Discussed with [facility] cardiology this will be arranged on Monday.  If positive for PFO (patent foramen ovale), check  bilateral lower extremity venous dopplers to rule out DVT as possible source of stroke. Bilateral lower extremity venous Dopplers will be  done prior to patient's discharge. Patient would also need to schedule outpatient telemetry monitoring to assess patient for atrial fibrillation as source of stroke. May be arranged with patient's cardiologist, or cardiologist of choice.    Status post recent below-knee outpatient-patient has yet to start and bleeding with physical therapy occupational therapy with her prosthesis.  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  H/o diabetes mellitus-continue glimepiride 4 mg daily/Metformin 250 bid-he will continue Lantus 20 units at bedtime and NovoLog 6 units 3 times a day before meals  Tobacco abuse-patient amenable to a trial of nicotine patch 7 mg every      Discharge Exam:   Blood pressure 177/92, pulse 79, temperature 98.1 F (36.7 C), temperature source Oral, resp. rate 16, height 5\' 6"  (1.676 m), weight 96.163 kg (212 lb), SpO2 99.00%.  General: Alert pleasant oriented Afro-American female, for head wrinkles appropriately smile symmetrical, uvula midline, extraocular movements intact, vision by direct confrontation is normal  Eyes: See above no pallor no icterus fundus sharp and crisp  ENT: Soft supple nontender no thyromegaly  Neck: no JVD, no Carotid bruit  Cardiovascular: s1 s2 no m/r/g  Respiratory: clinically clear  Abdomen: Soft nontender nondistended  Skin: No lower extremity  Musculoskeletal: Age motion grossly intact  Psychiatric: Euthymic however flat affect          Follow-up Information   Follow up with Gates Rigg, MD. Schedule an appointment as soon as possible for a visit in 2 months.   Contact  information:   [address] [facility] [place] [address] [phone]       Follow up with Cassell Clement, MD. Schedule an appointment as soon as possible for a visit in 3 days. (for TEE )    Contact information:   [address] [place] [address] [phone]       Follow up with [name], MD. Schedule an appointment as soon as possible for a visit in 1 week.   Contact information:   [address] [place] [address] [phone]       Signed: Richarda Overlie 06/26/2012, 9:49 AM

## 2012-06-27 NOTE — Progress Notes (Signed)
Agree with PTA.    Erasmo Vertz, PT [phone]  

## 2012-06-29 ENCOUNTER — Encounter: Payer: Self-pay | Admitting: Physician Assistant

## 2012-06-29 ENCOUNTER — Ambulatory Visit (INDEPENDENT_AMBULATORY_CARE_PROVIDER_SITE_OTHER): Payer: Managed Care, Other (non HMO) | Admitting: Physician Assistant

## 2012-06-29 VITALS — BP 122/78 | HR 91 | Ht 66.0 in

## 2012-06-29 DIAGNOSIS — I6340 Cerebral infarction due to embolism of unspecified cerebral artery: Secondary | ICD-10-CM

## 2012-06-29 DIAGNOSIS — I639 Cerebral infarction, unspecified: Secondary | ICD-10-CM

## 2012-06-29 DIAGNOSIS — I2510 Atherosclerotic heart disease of native coronary artery without angina pectoris: Secondary | ICD-10-CM

## 2012-06-29 DIAGNOSIS — R55 Syncope and collapse: Secondary | ICD-10-CM

## 2012-06-29 DIAGNOSIS — I10 Essential (primary) hypertension: Secondary | ICD-10-CM

## 2012-06-29 LAB — CBC WITH DIFFERENTIAL/PLATELET
Basophils Relative: 0.4 % (ref 0.0–3.0)
Eosinophils Absolute: 0.3 10*3/uL (ref 0.0–0.7)
Eosinophils Relative: 3.9 % (ref 0.0–5.0)
Lymphocytes Relative: 26.2 % (ref 12.0–46.0)
Monocytes Absolute: 0.3 10*3/uL (ref 0.1–1.0)
Neutrophils Relative %: 65.8 % (ref 43.0–77.0)
Platelets: 252 10*3/uL (ref 150.0–400.0)
RBC: 4.66 Mil/uL (ref 3.87–5.11)
WBC: 8.6 10*3/uL (ref 4.5–10.5)

## 2012-06-29 LAB — BASIC METABOLIC PANEL
Calcium: 9.2 mg/dL (ref 8.4–10.5)
GFR: 88.44 mL/min (ref >60.00)
Potassium: 4.1 mEq/L (ref 3.5–5.1)
Sodium: 140 mEq/L (ref 135–145)

## 2012-06-29 LAB — PROTIME-INR: Prothrombin Time: 11.7 s (ref 10.2–12.4)

## 2012-06-29 NOTE — H&P (Signed)
For TEE and plan as outlined. [name]

## 2012-06-29 NOTE — Care Management Note (Signed)
    Page 1 of 1   06/29/2012     1:25:14 PM   CARE MANAGEMENT NOTE 06/29/2012  Patient:  [name], [name]   Account Number:  [id number]  Date Initiated:  06/28/2012  Documentation initiated by:  [facility]  Subjective/Objective Assessment:   admitted with TIA     Action/Plan:   PT/OT evals- recommended outpatient PT   Anticipated DC Date:     Anticipated DC Plan:        DC Planning Services  Outpatient Services - Pt will follow up      Choice offered to / List presented to:             Status of service:  Completed, signed off Medicare Important Message given?   (If response is "NO", the following Medicare IM given date fields will be blank) Date Medicare IM given:   Date Additional Medicare IM given:    Discharge Disposition:  HOME/SELF CARE  Per UR Regulation:  Reviewed for med. necessity/level of care/duration of stay  If discussed at Long Length of Stay Meetings, dates discussed:    Comments:  06/29/12 Received call back from [facility], they do not work with CVA patients. Contacted  patient and gave her address and phone number for [facility] and informed her that they would call her to make appt. Faxed order, referral, d/c summary and PT eval to [phone]. Received fax confirmation. Jacquelynn Cree RN, BSN, CCM  06/28/12 Received referral for outpatient PT, patient was d/c'd on 06/26/12. Contacted patient at home, she is agreeable to going to outpatient PT at [facility]. Gave patient the phone # for them and explained that I would call and send in a referral, they will call her to schedule appt. Called [phone] and left voicemail for callback. Jacquelynn Cree RN, BSN, CCM

## 2012-06-29 NOTE — Progress Notes (Signed)
[address], [address] [place], [place]  [address] Phone: [phone], Fax:  [phone]  Date:  06/29/2012   ID:  [name], DOB 1955/11/21, MRN [id number]  PCP:  [name], MD  Primary Cardiologist:  Dr. Valera Castle  Primary Electrophysiologist:  Dr. Hillis Range     History of Present Illness: [name] is a 57 y.o. female who returns for follow up after 2 recent admissions to the hospital.  She has a hx of CAD, DM2, HTN, HL, syncope (ILR implanted without significant arrhythmia detected), prior stroke, GERD, status post left BKA. LHC in 6/10: pLAD 25%, mLAD 30%, dLAD 30%, very small diagonals with diffuse disease (not clinically significant vessels), mid AV groove CFX 50%, mid OM 75% (unchanged), proximal ramus 50-60%, superior branch 80%, pRCA 50-60%, EF 45-50%. Medical Rx was recommended for predominantly small vessel diffuse disease. Echo 8/13:  Mod LVH, EF 50-55%, Gr 1 diastolic dysfunction. Myoview 10/13: No ischemia, EF 57%.  Carotids 7/13:  No sig ICA stenosis.  She was seen in the emergency room by Dr. Jens Som 06/07/12. She complained of chest pain. Chest CT was negative for pulmonary embolism. She had negative serial cardiac enzymes in the emergency room. No further workup was recommended. 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 She was followed by neurology. I reviewed the chart. It appears to the neuro team spoke with Dr. Patty Sermons. The plan was to set the patient for an outpatient monitor and transesophageal echocardiogram.  LE dopplers neg for DVT.  She is doing well since d/c.  No chest pain, dyspnea, orthopnea, PND, edema, syncope.  No exertional chest pain.   Labs (2/14):  K 4.1, creatinine 1.00, LDL 119, Hgb 12.4, A1c 7.4,   Wt Readings from Last 3 Encounters:  06/24/12 212 lb (96.163 kg)  06/07/12 212 lb (96.163 kg)  03/19/12 212 lb (96.163 kg)     Past Medical History    Diagnosis Date  . Atypical chest pain     a. non obstructive by cath in 2008 and 2010;  b. 02/2012 Myoview: non-ischemic, EF 57%  . HTN (hypertension)   . Hyperlipidemia   . Syncope and collapse     a. near-syncopal episode in November 2008;  b. 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  . 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 Peripheral neuropathy   . Obesity, unspecified   . Esophageal reflux   . Diabetes mellitus, type II   . Diaphragmatic hernia without mention of obstruction or gangrene   . Irritable bowel syndrome   . Morbid obesity   . Cellulitis     a. 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 Stroke     2013-'no residual'  . Vertigo   . Dry skin   . Constipation   . Tobacco abuse     Current Outpatient Prescriptions  Medication Sig Dispense Refill  . acetaminophen (TYLENOL) 500 MG tablet Take 1,000 mg by mouth every 6 (six) hours as needed. For pain      . dipyridamole-aspirin (AGGRENOX) 200-25 MG per 12 hr capsule Take 1 capsule by mouth at bedtime.  60 capsule  2  . glimepiride (AMARYL) 4 MG tablet Take 4 mg by mouth daily.       . hydrochlorothiazide (HYDRODIURIL) 25 MG tablet Take 25 mg by mouth daily.      . insulin aspart (NOVOLOG) 100 UNIT/ML injection Inject 6  Units into the skin 3 (three) times daily before meals.       . insulin glargine (LANTUS) 100 UNIT/ML injection Inject 20 Units into the skin at bedtime.      Marland Kitchen lisinopril (PRINIVIL,ZESTRIL) 20 MG tablet Take 20 mg by mouth daily.      . metFORMIN (GLUCOPHAGE) 500 MG tablet Take 250 mg by mouth 2 (two) times daily with a meal.      . methocarbamol (ROBAXIN) 500 MG tablet Take 500 mg by mouth 2 (two) times daily as needed. For muscle spasms      . nicotine (NICODERM CQ - DOSED IN MG/24 HR) 7 mg/24hr patch Place 1 patch onto the skin daily.  28 patch  0  . simvastatin (ZOCOR) 40 MG tablet Take 40 mg by mouth every evening.       No current facility-administered medications for  this visit.    Allergies:    Allergies  Allergen Reactions  . Banana Other (See Comments)    Facial swelling  . Penicillins Itching     edema  . Strawberry Other (See Comments)    Facial swelling    Social History:  The patient  reports that she has been smoking Cigarettes.  She has a 10 pack-year smoking history. She has never used smokeless tobacco. She reports that she drinks about 2.4 ounces of alcohol per week. She reports that she does not use illicit drugs.   ROS:  Please see the history of present illness.    All other systems reviewed and negative.   PHYSICAL EXAM: VS:  BP 122/78  Pulse 91  Ht 5\' 6"  (1.676 m) Well nourished, well developed, in no acute distress HEENT: normal Neck: no JVD at 90 Cardiac:  normal S1, S2; RRR; no murmur Lungs:  clear to auscultation bilaterally, no wheezing, rhonchi or rales Abd: soft, nontender, no hepatomegaly Ext: no edema; right BKA Skin: warm and dry Neuro:  CNs 2-12 intact, no focal abnormalities noted  EKG:  NSR, HR 91, normal axis, septal Q waves, T wave inversions in 2-3, aVF, V5-V6     ASSESSMENT AND PLAN:  1. Embolic CVA: Workup by neurology unremarkable the hospital. She still needs a transesophageal echocardiogram to rule out PFO and an event monitor to rule out atrial fibrillation. I discussed this with the patient. I also discussed this with Dr. Jens Som (DOD). We will schedule her for a transesophageal echocardiogram. Event monitor will be placed. 2. Coronary Artery Disease: No angina. Continue aspirin, statin 3. Hypertension: Controlled. 4. Hyperlipidemia: Managed by primary care. 5. Syncope: Workup with ILR in the past unrevealing. No recurrence 6. Disposition: Followup with either me or Dr. Daleen Squibb in 6 weeks.  Signed, Tereso Newcomer, PA-C  1:55 PM 06/29/2012

## 2012-06-29 NOTE — Patient Instructions (Addendum)
Your physician has requested that you have a TEE on 07/07/12.  Please refer to your letter for your instructions. During a TEE, sound waves are used to create images of your heart. It provides your doctor with information about the size and shape of your heart and how well your heart's chambers and valves are working. In this test, a transducer is attached to the end of a flexible tube that's guided down your throat and into your esophagus (the tube leading from you mouth to your stomach) to get a more detailed image of your heart. You are not awake for the procedure. Please see the instruction sheet given to you today. For further information please visit [email or web address]   Your physician recommends that you schedule a follow-up appointment in: 6 weeks with Dr Daleen Squibb or Tereso Newcomer on a day that Dr Daleen Squibb is in the office  Your physician has recommended that you wear an event monitor. Event monitors are medical devices that record the heart's electrical activity. Doctors most often [place] these monitors to diagnose arrhythmias. Arrhythmias are problems with the speed or rhythm of the heartbeat. The monitor is a small, portable device. You can wear one while you do your normal daily activities. This is usually used to diagnose what is causing palpitations/syncope (passing out).  Your physician recommends that you return for lab work in: today (bmet, cbc, pt/inr)

## 2012-06-29 NOTE — H&P (Signed)
History and Physical  Date:  06/29/2012   ID:  LEATA DOMINY, DOB [birth year], MRN [id number]  PCP:  [name], MD  Primary Cardiologist:  Dr. Valera Castle  Primary Electrophysiologist:  Dr. Hillis Range     History of Present Illness: [name] is a 57 y.o. female who returns for follow up after 2 recent admissions to the hospital.  She has a hx of CAD, DM2, HTN, HL, syncope (ILR implanted without significant arrhythmia detected), prior stroke, GERD, status post left BKA. LHC in 6/10: pLAD 25%, mLAD 30%, dLAD 30%, very small diagonals with diffuse disease (not clinically significant vessels), mid AV groove CFX 50%, mid OM 75% (unchanged), proximal ramus 50-60%, superior branch 80%, pRCA 50-60%, EF 45-50%. Medical Rx was recommended for predominantly small vessel diffuse disease. Echo 8/13:  Mod LVH, EF 50-55%, Gr 1 diastolic dysfunction. Myoview 10/13: No ischemia, EF 57%.  Carotids 7/13:  No sig ICA stenosis.  She was seen in the emergency room by Dr. Jens Som 06/07/12. She complained of chest pain. Chest CT was negative for pulmonary embolism. She had negative serial cardiac enzymes in the emergency room. No further workup was recommended. 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 She was followed by neurology. I reviewed the chart. It appears to the neuro team spoke with Dr. Patty Sermons. The plan was to set the patient for an outpatient monitor and transesophageal echocardiogram.  LE dopplers neg for DVT.  She is doing well since d/c.  No chest pain, dyspnea, orthopnea, PND, edema, syncope.  No exertional chest pain.   Labs (2/14):  K 4.1, creatinine 1.00, LDL 119, Hgb 12.4, A1c 7.4,   Wt Readings from Last 3 Encounters:  06/24/12 212 lb (96.163 kg)  06/07/12 212 lb (96.163 kg)  03/19/12 212 lb (96.163 kg)     Past Medical History  Diagnosis Date  . Atypical chest pain     a. non obstructive by cath in 2008 and 2010;   b. 02/2012 Myoview: non-ischemic, EF 57%  . HTN (hypertension)   . Hyperlipidemia   . Syncope and collapse     a. near-syncopal episode in November 2008;  b. 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  . 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 Peripheral neuropathy   . Obesity, unspecified   . Esophageal reflux   . Diabetes mellitus, type II   . Diaphragmatic hernia without mention of obstruction or gangrene   . Irritable bowel syndrome   . Morbid obesity   . Cellulitis     a. 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 Stroke     2013-'no residual'  . Vertigo   . Dry skin   . Constipation   . Tobacco abuse     Current Outpatient Prescriptions  Medication Sig Dispense Refill  . acetaminophen (TYLENOL) 500 MG tablet Take 1,000 mg by mouth every 6 (six) hours as needed. For pain      . dipyridamole-aspirin (AGGRENOX) 200-25 MG per 12 hr capsule Take 1 capsule by mouth at bedtime.  60 capsule  2  . glimepiride (AMARYL) 4 MG tablet Take 4 mg by mouth daily.       . hydrochlorothiazide (HYDRODIURIL) 25 MG tablet Take 25 mg by mouth daily.      . insulin aspart (NOVOLOG) 100 UNIT/ML injection Inject 6 Units into the skin 3 (three) times daily before meals.       Marland Kitchen  insulin glargine (LANTUS) 100 UNIT/ML injection Inject 20 Units into the skin at bedtime.      Marland Kitchen lisinopril (PRINIVIL,ZESTRIL) 20 MG tablet Take 20 mg by mouth daily.      . metFORMIN (GLUCOPHAGE) 500 MG tablet Take 250 mg by mouth 2 (two) times daily with a meal.      . methocarbamol (ROBAXIN) 500 MG tablet Take 500 mg by mouth 2 (two) times daily as needed. For muscle spasms      . nicotine (NICODERM CQ - DOSED IN MG/24 HR) 7 mg/24hr patch Place 1 patch onto the skin daily.  28 patch  0  . simvastatin (ZOCOR) 40 MG tablet Take 40 mg by mouth every evening.       No current facility-administered medications for this visit.    Allergies:    Allergies  Allergen Reactions  . Banana Other (See Comments)     Facial swelling  . Penicillins Itching     edema  . Strawberry Other (See Comments)    Facial swelling    Social History:  The patient  reports that she has been smoking Cigarettes.  She has a 10 pack-year smoking history. She has never used smokeless tobacco. She reports that she drinks about 2.4 ounces of alcohol per week. She reports that she does not use illicit drugs.   ROS:  Please see the history of present illness.    All other systems reviewed and negative.   PHYSICAL EXAM: VS:  BP 122/78  Pulse 91  Ht 5\' 6"  (1.676 m) Well nourished, well developed, in no acute distress HEENT: normal Neck: no JVD at 90 Cardiac:  normal S1, S2; RRR; no murmur Lungs:  clear to auscultation bilaterally, no wheezing, rhonchi or rales Abd: soft, nontender, no hepatomegaly Ext: no edema; right BKA Skin: warm and dry Neuro:  CNs 2-12 intact, no focal abnormalities noted  EKG:  NSR, HR 91, normal axis, septal Q waves, T wave inversions in 2-3, aVF, V5-V6     ASSESSMENT AND PLAN:  1. Embolic CVA: Workup by neurology unremarkable the hospital. She still needs a transesophageal echocardiogram to rule out PFO and an event monitor to rule out atrial fibrillation. I discussed this with the patient. I also discussed this with Dr. Jens Som (DOD). We will schedule her for a transesophageal echocardiogram. Event monitor will be placed. 2. Coronary Artery Disease: No angina. Continue aspirin, statin 3. Hypertension: Controlled. 4. Hyperlipidemia: Managed by primary care. 5. Syncope: Workup with ILR in the past unrevealing. No recurrence 6. Disposition: Followup with either me or Dr. Daleen Squibb in 6 weeks.  Signed, Tereso Newcomer, PA-C  1:55 PM 06/29/2012

## 2012-07-05 ENCOUNTER — Telehealth: Payer: Self-pay | Admitting: *Deleted

## 2012-07-05 NOTE — Telephone Encounter (Signed)
pt notified about lab results with verbal understanding today, pt advised to f/u PCP abdout DM, pt said ok

## 2012-07-05 NOTE — Telephone Encounter (Signed)
Message copied by Tarri Fuller on Mon Jul 05, 2012  1:10 PM ------      Message from: [place], [place] T      Created: Wed Jun 30, 2012  1:28 PM       Labs ok except sugar high.      Follow up with PCP for diabetes.      Continue with current treatment plan.      Tereso Newcomer, PA-C  1:28 PM 06/30/2012 ------

## 2012-07-07 ENCOUNTER — Encounter (HOSPITAL_COMMUNITY): Payer: Self-pay | Admitting: *Deleted

## 2012-07-07 ENCOUNTER — Telehealth: Payer: Self-pay | Admitting: *Deleted

## 2012-07-07 ENCOUNTER — Encounter (INDEPENDENT_AMBULATORY_CARE_PROVIDER_SITE_OTHER): Payer: Managed Care, Other (non HMO)

## 2012-07-07 ENCOUNTER — Ambulatory Visit (HOSPITAL_COMMUNITY)
Admission: RE | Admit: 2012-07-07 | Discharge: 2012-07-07 | Disposition: A | Discharge status: Home / Self Care | Payer: Managed Care, Other (non HMO) | Source: Ambulatory Visit | Attending: Cardiology | Admitting: Cardiology

## 2012-07-07 ENCOUNTER — Encounter (HOSPITAL_COMMUNITY)
Admission: RE | Disposition: A | Discharge status: Home / Self Care | Payer: Self-pay | Source: Ambulatory Visit | Attending: Cardiology

## 2012-07-07 DIAGNOSIS — I700 Atherosclerosis of aorta: Secondary | ICD-10-CM | POA: Insufficient documentation

## 2012-07-07 DIAGNOSIS — I639 Cerebral infarction, unspecified: Secondary | ICD-10-CM

## 2012-07-07 DIAGNOSIS — R55 Syncope and collapse: Secondary | ICD-10-CM

## 2012-07-07 DIAGNOSIS — I6789 Other cerebrovascular disease: Secondary | ICD-10-CM

## 2012-07-07 DIAGNOSIS — I6350 Cerebral infarction due to unspecified occlusion or stenosis of unspecified cerebral artery: Secondary | ICD-10-CM | POA: Insufficient documentation

## 2012-07-07 HISTORY — PX: TEE WITHOUT CARDIOVERSION: SHX5443

## 2012-07-07 SURGERY — ECHOCARDIOGRAM, TRANSESOPHAGEAL
Anesthesia: Moderate Sedation

## 2012-07-07 MED ORDER — MIDAZOLAM HCL 10 MG/2ML IJ SOLN
INTRAMUSCULAR | Status: DC | PRN
  Administered 2012-07-07 (×2): 2 mg via INTRAVENOUS

## 2012-07-07 MED ORDER — FENTANYL CITRATE 0.05 MG/ML IJ SOLN
INTRAMUSCULAR | Status: AC
  Filled 2012-07-07: qty 2

## 2012-07-07 MED ORDER — SODIUM CHLORIDE 0.9 % IV SOLN: INTRAVENOUS | Status: DC

## 2012-07-07 MED ORDER — MIDAZOLAM HCL 5 MG/ML IJ SOLN
INTRAMUSCULAR | Status: AC
  Filled 2012-07-07: qty 2

## 2012-07-07 MED ORDER — BUTAMBEN-TETRACAINE-BENZOCAINE 2-2-14 % EX AERO
INHALATION_SPRAY | CUTANEOUS | Status: DC | PRN
  Administered 2012-07-07: 2 via TOPICAL

## 2012-07-07 MED ORDER — FENTANYL CITRATE 0.05 MG/ML IJ SOLN
INTRAMUSCULAR | Status: DC | PRN
  Administered 2012-07-07: 25 ug via INTRAVENOUS

## 2012-07-07 NOTE — Progress Notes (Signed)
Echocardiogram Echocardiogram Transesophageal has been performed.  Kadiatou Oplinger 07/07/2012, 10:56 AM

## 2012-07-07 NOTE — Telephone Encounter (Signed)
Pt No show for appt, s/w her-offered to have it mail, Pt. Accepted.  Event monitor enrolled to home address 07/07/12 TK

## 2012-07-07 NOTE — Addendum Note (Signed)
Addended by: Tarri Fuller on: 07/07/2012 03:16 PM   Modules accepted: Orders

## 2012-07-07 NOTE — CV Procedure (Signed)
See full TEE report in [facility]. 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 [name]

## 2012-07-07 NOTE — Interval H&P Note (Signed)
History and Physical Interval Note:  07/07/2012 10:14 AM  [name]  has presented today for surgery, with the diagnosis of embolic cva  The various methods of treatment have been discussed with the patient and family. After consideration of risks, benefits and other options for treatment, the patient has consented to  Procedure(s): TRANSESOPHAGEAL ECHOCARDIOGRAM (TEE) (N/A) as a surgical intervention .  The patient's history has been reviewed, patient examined, no change in status, stable for surgery.  I have reviewed the patient's chart and labs.  Questions were answered to the patient's satisfaction.     Olga Millers

## 2012-07-08 ENCOUNTER — Encounter (HOSPITAL_COMMUNITY): Payer: Self-pay | Admitting: Cardiology

## 2012-07-15 ENCOUNTER — Ambulatory Visit: Payer: Managed Care, Other (non HMO) | Attending: Orthopedic Surgery | Admitting: Physical Therapy

## 2012-07-15 DIAGNOSIS — IMO0001 Reserved for inherently not codable concepts without codable children: Secondary | ICD-10-CM | POA: Insufficient documentation

## 2012-07-15 DIAGNOSIS — S88119A Complete traumatic amputation at level between knee and ankle, unspecified lower leg, initial encounter: Secondary | ICD-10-CM | POA: Insufficient documentation

## 2012-07-15 DIAGNOSIS — R5381 Other malaise: Secondary | ICD-10-CM | POA: Insufficient documentation

## 2012-07-15 DIAGNOSIS — M6281 Muscle weakness (generalized): Secondary | ICD-10-CM | POA: Insufficient documentation

## 2012-07-15 DIAGNOSIS — R269 Unspecified abnormalities of gait and mobility: Secondary | ICD-10-CM | POA: Insufficient documentation

## 2012-07-16 ENCOUNTER — Telehealth: Payer: Self-pay | Admitting: *Deleted

## 2012-07-16 NOTE — Telephone Encounter (Signed)
pt notified about echo results with verbal understanding today and has f/u 4/2 with Tereso Newcomer, PAC advised to keep f/u appt, pt said ok

## 2012-07-16 NOTE — Telephone Encounter (Signed)
Message copied by Tarri Fuller on Fri Jul 16, 2012 11:58 AM ------      Message from: Gerome Apley      Created: Thu Jul 15, 2012  5:59 PM                   ----- Message -----         From: Beatrice Lecher, PA         Sent: 07/09/2012  12:18 PM           To: [facility] Triage            Reviewed TEE findings with Dr. Olga Millers      No PFO or right to left shunt      Continue current therapy      Please forward results to patient's neurologist.      Tereso Newcomer, PA-C  12:18 PM 07/09/2012 ------

## 2012-07-16 NOTE — Telephone Encounter (Signed)
Message copied by Tarri Fuller on Fri Jul 16, 2012 12:01 PM ------      Message from: Gerome Apley      Created: Thu Jul 15, 2012  5:59 PM                   ----- Message -----         From: Beatrice Lecher, PA         Sent: 07/09/2012  12:18 PM           To: [facility] Triage            Reviewed TEE findings with Dr. Olga Millers      No PFO or right to left shunt      Continue current therapy      Please forward results to patient's neurologist.      Tereso Newcomer, PA-C  12:18 PM 07/09/2012 ------

## 2012-07-21 ENCOUNTER — Ambulatory Visit: Payer: Managed Care, Other (non HMO) | Attending: Orthopedic Surgery | Admitting: Physical Therapy

## 2012-07-21 DIAGNOSIS — R5381 Other malaise: Secondary | ICD-10-CM | POA: Insufficient documentation

## 2012-07-21 DIAGNOSIS — IMO0001 Reserved for inherently not codable concepts without codable children: Secondary | ICD-10-CM | POA: Insufficient documentation

## 2012-07-21 DIAGNOSIS — R269 Unspecified abnormalities of gait and mobility: Secondary | ICD-10-CM | POA: Insufficient documentation

## 2012-07-21 DIAGNOSIS — S88119A Complete traumatic amputation at level between knee and ankle, unspecified lower leg, initial encounter: Secondary | ICD-10-CM | POA: Insufficient documentation

## 2012-07-21 DIAGNOSIS — M6281 Muscle weakness (generalized): Secondary | ICD-10-CM | POA: Insufficient documentation

## 2012-07-23 ENCOUNTER — Encounter: Payer: Managed Care, Other (non HMO) | Admitting: Physical Therapy

## 2012-07-27 ENCOUNTER — Ambulatory Visit: Payer: Managed Care, Other (non HMO) | Admitting: Physical Therapy

## 2012-07-28 ENCOUNTER — Encounter: Payer: Managed Care, Other (non HMO) | Admitting: Physical Therapy

## 2012-07-30 ENCOUNTER — Ambulatory Visit: Payer: Managed Care, Other (non HMO) | Admitting: Physical Therapy

## 2012-08-04 ENCOUNTER — Ambulatory Visit: Payer: Managed Care, Other (non HMO) | Admitting: Physical Therapy

## 2012-08-06 ENCOUNTER — Ambulatory Visit: Payer: Managed Care, Other (non HMO) | Admitting: Physical Therapy

## 2012-08-11 ENCOUNTER — Ambulatory Visit: Payer: Managed Care, Other (non HMO) | Admitting: Physical Therapy

## 2012-08-13 ENCOUNTER — Ambulatory Visit: Payer: Managed Care, Other (non HMO) | Admitting: Physical Therapy

## 2012-08-16 ENCOUNTER — Telehealth: Payer: Self-pay

## 2012-08-16 ENCOUNTER — Ambulatory Visit: Payer: Managed Care, Other (non HMO) | Admitting: Physician Assistant

## 2012-08-16 NOTE — Telephone Encounter (Signed)
Pt given her Event monitor results, she verbalized understanding.

## 2012-08-17 ENCOUNTER — Ambulatory Visit: Payer: Managed Care, Other (non HMO) | Attending: Orthopedic Surgery | Admitting: Physical Therapy

## 2012-08-17 DIAGNOSIS — R5381 Other malaise: Secondary | ICD-10-CM | POA: Insufficient documentation

## 2012-08-17 DIAGNOSIS — M6281 Muscle weakness (generalized): Secondary | ICD-10-CM | POA: Insufficient documentation

## 2012-08-17 DIAGNOSIS — S88119A Complete traumatic amputation at level between knee and ankle, unspecified lower leg, initial encounter: Secondary | ICD-10-CM | POA: Insufficient documentation

## 2012-08-17 DIAGNOSIS — R269 Unspecified abnormalities of gait and mobility: Secondary | ICD-10-CM | POA: Insufficient documentation

## 2012-08-17 DIAGNOSIS — IMO0001 Reserved for inherently not codable concepts without codable children: Secondary | ICD-10-CM | POA: Insufficient documentation

## 2012-08-18 ENCOUNTER — Ambulatory Visit: Payer: Managed Care, Other (non HMO) | Admitting: Physician Assistant

## 2012-08-20 ENCOUNTER — Ambulatory Visit: Payer: Managed Care, Other (non HMO) | Admitting: Physical Therapy

## 2012-08-24 ENCOUNTER — Ambulatory Visit: Payer: Managed Care, Other (non HMO) | Admitting: Physical Therapy

## 2012-08-26 ENCOUNTER — Ambulatory Visit: Payer: Managed Care, Other (non HMO) | Admitting: Physical Therapy

## 2012-09-02 ENCOUNTER — Ambulatory Visit: Payer: Managed Care, Other (non HMO) | Admitting: Physical Therapy

## 2012-09-03 ENCOUNTER — Encounter: Payer: Self-pay | Admitting: Internal Medicine

## 2012-09-06 ENCOUNTER — Encounter: Payer: Self-pay | Admitting: Physical Therapy

## 2012-09-06 ENCOUNTER — Ambulatory Visit: Payer: Managed Care, Other (non HMO) | Admitting: Physical Therapy

## 2012-09-07 ENCOUNTER — Emergency Department (HOSPITAL_COMMUNITY)
Admission: EM | Admit: 2012-09-07 | Discharge: 2012-09-07 | Disposition: A | Discharge status: Home / Self Care | Payer: Managed Care, Other (non HMO) | Attending: Emergency Medicine | Admitting: Emergency Medicine

## 2012-09-07 ENCOUNTER — Encounter (HOSPITAL_COMMUNITY): Payer: Self-pay

## 2012-09-07 ENCOUNTER — Emergency Department (HOSPITAL_COMMUNITY): Payer: Managed Care, Other (non HMO)

## 2012-09-07 DIAGNOSIS — R209 Unspecified disturbances of skin sensation: Secondary | ICD-10-CM | POA: Insufficient documentation

## 2012-09-07 DIAGNOSIS — E119 Type 2 diabetes mellitus without complications: Secondary | ICD-10-CM | POA: Insufficient documentation

## 2012-09-07 DIAGNOSIS — Z880 Allergy status to penicillin: Secondary | ICD-10-CM | POA: Insufficient documentation

## 2012-09-07 DIAGNOSIS — Z794 Long term (current) use of insulin: Secondary | ICD-10-CM | POA: Insufficient documentation

## 2012-09-07 DIAGNOSIS — Z8719 Personal history of other diseases of the digestive system: Secondary | ICD-10-CM | POA: Insufficient documentation

## 2012-09-07 DIAGNOSIS — R51 Headache: Secondary | ICD-10-CM | POA: Insufficient documentation

## 2012-09-07 DIAGNOSIS — Z8673 Personal history of transient ischemic attack (TIA), and cerebral infarction without residual deficits: Secondary | ICD-10-CM | POA: Insufficient documentation

## 2012-09-07 DIAGNOSIS — Z8669 Personal history of other diseases of the nervous system and sense organs: Secondary | ICD-10-CM | POA: Insufficient documentation

## 2012-09-07 DIAGNOSIS — F17200 Nicotine dependence, unspecified, uncomplicated: Secondary | ICD-10-CM | POA: Insufficient documentation

## 2012-09-07 DIAGNOSIS — E785 Hyperlipidemia, unspecified: Secondary | ICD-10-CM | POA: Insufficient documentation

## 2012-09-07 DIAGNOSIS — I10 Essential (primary) hypertension: Secondary | ICD-10-CM | POA: Insufficient documentation

## 2012-09-07 DIAGNOSIS — Z79899 Other long term (current) drug therapy: Secondary | ICD-10-CM | POA: Insufficient documentation

## 2012-09-07 DIAGNOSIS — Z872 Personal history of diseases of the skin and subcutaneous tissue: Secondary | ICD-10-CM | POA: Insufficient documentation

## 2012-09-07 LAB — CBC WITH DIFFERENTIAL/PLATELET
Basophils Relative: 0 % (ref 0–1)
Eosinophils Absolute: 0.1 10*3/uL (ref 0.0–0.7)
Eosinophils Relative: 1 % (ref 0–5)
HCT: 38 % (ref 36.0–46.0)
Hemoglobin: 12.6 g/dL (ref 12.0–15.0)
MCH: 26.6 pg (ref 26.0–34.0)
MCHC: 33.2 g/dL (ref 30.0–36.0)
MCV: 80.3 fL (ref 78.0–100.0)
Monocytes Absolute: 0.3 10*3/uL (ref 0.1–1.0)
Monocytes Relative: 4 % (ref 3–12)

## 2012-09-07 LAB — BASIC METABOLIC PANEL
BUN: 17 mg/dL (ref 6–23)
Creatinine, Ser: 0.83 mg/dL (ref 0.50–1.10)
GFR calc Af Amer: 89 mL/min — ABNORMAL LOW (ref >90)
GFR calc non Af Amer: 77 mL/min — ABNORMAL LOW (ref >90)
Glucose, Bld: 237 mg/dL — ABNORMAL HIGH (ref 70–99)

## 2012-09-07 MED ORDER — IBUPROFEN 200 MG PO TABS
400.0000 mg | ORAL_TABLET | Freq: Once | ORAL | Status: AC
  Administered 2012-09-07: 400 mg via ORAL
  Filled 2012-09-07: qty 2

## 2012-09-07 NOTE — ED Notes (Signed)
Pt transported to CT scan.

## 2012-09-07 NOTE — ED Notes (Signed)
Pt was at work today and used walker for the first time since BKA left side.  Pt began having left arm and back pain today at work.  Pt denies chest pain, was given nitro x3 SL with EMS.  Pt was also given ASA 324mg .  Pt now endorsing HA 7/10.  EKG with EMS unremarkable.

## 2012-09-07 NOTE — ED Notes (Signed)
CBG 214 

## 2012-09-07 NOTE — ED Provider Notes (Signed)
History     CSN: [id number]  Arrival date & time 09/07/12  1215   First MD Initiated Contact with Patient 09/07/12 1307      Chief Complaint  Patient presents with  . Back Pain    (Consider location/radiation/quality/duration/timing/severity/associated sxs/prior treatment) HPI Comments: Patient with hx of HTN, CVA, and DM with L BKA in 11/2011 presents for throbbing headache in her forehead with onset this morning while at work without thunderclap onset. Patient states symptoms were followed by a numbing sensation in her L arm and hand. Patient states symptoms have been constant since onset without any aggravating or alleviating factors. Patient denies fever, visual disturbances, dysphagia, dysarthria, facial droop, CP, SOB, N/V, abdominal pain, and extremity weakness.  The history is provided by the patient. No language interpreter was used.    Past Medical History  Diagnosis Date  . Atypical chest pain     a. non obstructive by cath in 2008 and 2010;  b. 02/2012 Myoview: non-ischemic, EF 57%  . HTN (hypertension)   . Hyperlipidemia   . Syncope and collapse     a. near-syncopal episode in November 2008;  b. 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  . CVA (cerebral infarction)     a.  Small right parietal noted incidentally 04/2007;  b. right sided embolic CVA 05/2012;  c. 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 Peripheral neuropathy   . Obesity, unspecified   . Esophageal reflux   . Diabetes mellitus, type II   . Diaphragmatic hernia without mention of obstruction or gangrene   . Irritable bowel syndrome   . Morbid obesity   . Cellulitis     a. 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 Stroke     2013-'no residual'  . Vertigo   . Dry skin   . Constipation   . Tobacco abuse     Past Surgical History  Procedure Laterality Date  . Invasive electrophysiologic study  5/09    followed  by insertion of an implantable loop recorder. S/p removal   . Colonoscopy    . Cholecystectomy    . Multiple toe surgeries    . Cardiac catheterization      LAD 30%, circumflex 50%, OM 75%, RI 60% with small branch 80%, dominant RCA 60%, EF 45-50%  . I&d extremity  12/23/2011    Procedure: IRRIGATION AND DEBRIDEMENT EXTREMITY;  Surgeon: Verlee Rossetti, MD;  Location: [facility] OR;  Service: Orthopedics;  Laterality: Left;  Left Foot  . I&d extremity  12/26/2011    Procedure: IRRIGATION AND DEBRIDEMENT EXTREMITY;  Surgeon: Toni Arthurs, MD;  Location: [facility] OR;  Service: Orthopedics;  Laterality: Left;  LEFT FOOT I&D WITH POSSIBLE WOUND VAC APPLICATION, POSSIBLE LEFT FIRST RAY AMPUTATION  . Amputation  12/30/2011    Procedure: AMPUTATION RAY;  Surgeon: Toni Arthurs, MD;  Location: [facility] OR;  Service: Orthopedics;  Laterality: Left;  LEFT FIRST RAY AMPUTATION  . Eye surgery      cataract  . Amputation  01/13/2012    Procedure: AMPUTATION BELOW KNEE;  Surgeon: Toni Arthurs, MD;  Location: [facility] OR;  Service: Orthopedics;  Laterality: Left;  . Amputation  03/04/2012    Procedure: AMPUTATION BELOW KNEE;  Surgeon: Toni Arthurs, MD;  Location: [facility] OR;  Service: Orthopedics;  Laterality: Left;  Revision of Left Below Knee Amputation  . Tee without cardioversion N/A 07/07/2012  Procedure: TRANSESOPHAGEAL ECHOCARDIOGRAM (TEE);  Surgeon: Lewayne Bunting, MD;  Location: [facility] ENDOSCOPY;  Service: Cardiovascular;  Laterality: N/A;    Family History  Problem Relation Age of Onset  . Pneumonia Mother   . Gallbladder disease Mother     cancer  . Heart failure Mother   . Diabetes Father   . Coronary artery disease Father   . Stroke Neg Hx     History  Substance Use Topics  . Smoking status: Current Every Day Smoker -- 0.25 packs/day for 40 years    Types: Cigarettes  . Smokeless tobacco: Never Used  . Alcohol Use: 2.4 oz/week    4 Cans of beer per week     Comment: occ- foot ball season- 4 beers a week during football  season.    OB History   Grav Para Term Preterm Abortions TAB SAB Ect Mult Living                  Review of Systems  Constitutional: Negative for fever, chills and diaphoresis.  HENT: Negative for trouble swallowing, neck pain, neck stiffness and voice change.   Eyes: Negative for visual disturbance.  Respiratory: Negative for cough and shortness of breath.   Cardiovascular: Negative for chest pain.  Gastrointestinal: Negative for vomiting, diarrhea and abdominal distention.  Neurological: Positive for numbness (resolving) and headaches. Negative for syncope and weakness.  All other systems reviewed and are negative.    Allergies  Banana; Penicillins; and Strawberry  Home Medications   Current Outpatient Rx  Name  Route  Sig  Dispense  Refill  . acetaminophen (TYLENOL) 500 MG tablet   Oral   Take 1,000 mg by mouth as needed for pain. For pain         . dipyridamole-aspirin (AGGRENOX) 200-25 MG per 12 hr capsule   Oral   Take 1 capsule by mouth at bedtime.   60 capsule   2   . glimepiride (AMARYL) 4 MG tablet   Oral   Take 4 mg by mouth daily.          . hydrochlorothiazide (HYDRODIURIL) 25 MG tablet   Oral   Take 25 mg by mouth daily.         . insulin aspart (NOVOLOG) 100 UNIT/ML injection   Subcutaneous   Inject 6 Units into the skin 3 (three) times daily before meals.          . insulin glargine (LANTUS) 100 UNIT/ML injection   Subcutaneous   Inject 20 Units into the skin at bedtime.         Marland Kitchen lisinopril (PRINIVIL,ZESTRIL) 20 MG tablet   Oral   Take 20 mg by mouth daily.         . metFORMIN (GLUCOPHAGE) 500 MG tablet   Oral   Take 250 mg by mouth 2 (two) times daily with a meal.         . simvastatin (ZOCOR) 40 MG tablet   Oral   Take 40 mg by mouth every evening.           BP 133/69  Pulse 90  Temp(Src) 97.3 F (36.3 C) (Axillary)  Resp 15  SpO2 100%  Physical Exam  Nursing note and vitals reviewed. Constitutional: She  is oriented to person, place, and time. No distress.  Morbidly obese female; well and nontoxic appearing in NAD. Patient speaks in full sentences without dysarthria or aphasia and answers questions appropriately  HENT:  Head: Normocephalic and atraumatic.  Mouth/Throat: Oropharynx is clear and moist. No oropharyngeal exudate.  Eyes: Conjunctivae and EOM are normal. Pupils are equal, round, and reactive to light. No scleral icterus.  Neck: Normal range of motion. Neck supple.  Cardiovascular: Normal rate, regular rhythm, normal heart sounds and intact distal pulses.   Distal radial b/l, L femoral, and right DP and PT pulses 2+  Pulmonary/Chest: Effort normal and breath sounds normal. No respiratory distress. She has no wheezes. She has no rales.  Abdominal: Soft. She exhibits no distension. There is no tenderness. There is no rebound and no guarding.  Musculoskeletal: Normal range of motion. She exhibits no edema.  Lymphadenopathy:    She has no cervical adenopathy.  Neurological: She is alert and oriented to person, place, and time.  Cranial nerves II through XII grossly intact. Patient has equal grip strength bilaterally with 5 out of 5 strength against resistance in her upper and lower extremities. No gross sensory or motor deficits appreciated.  Skin: Skin is warm and dry. No rash noted. She is not diaphoretic. No erythema.  Psychiatric: She has a normal mood and affect. Her behavior is normal.    ED Course  Procedures (including critical care time)  Labs Reviewed  BASIC METABOLIC PANEL - Abnormal; Notable for the following:    Glucose, Bld 237 (*)    GFR calc non Af Amer 77 (*)    GFR calc Af Amer 89 (*)    All other components within normal limits  GLUCOSE, CAPILLARY - Abnormal; Notable for the following:    Glucose-Capillary 214 (*)    All other components within normal limits  CBC WITH DIFFERENTIAL   Ct Head Wo Contrast  09/07/2012  *RADIOLOGY REPORT*  Clinical Data:  Headache.  Left arm numbness.  CT HEAD WITHOUT CONTRAST  Technique:  Contiguous axial images were obtained from the base of the skull through the vertex without contrast.  Comparison: MRI 06/25/2012  Findings: Mild atrophy.  Negative for hydrocephalus.  Mild chronic microvascular ischemic changes in the white matter and basal ganglia.  No acute infarct.  Negative for hemorrhage or mass. Calvarium intact.  IMPRESSION: Atrophy and chronic microvascular ischemia.  No acute abnormality.   Original Report Authenticated By: Janeece Riggers, M.D.      Date: 09/07/2012  Rate: 95  Rhythm: normal sinus rhythm  QRS Axis: normal  Intervals: normal  ST/T Wave abnormalities: normal  Conduction Disutrbances:none  Narrative Interpretation: NSR without STEMI or T wave inversion  Old EKG Reviewed: unchanged from 06/29/2012   1. Headache     MDM  Patient with hx of HTN, DM, and CVA in 05/2012 presents for headache with L arm numbness with onset this morning while at work. On physical exam patient speaks in full sentences with dysarthria or aphasia, heart RRR, lungs CTAB, and abdomen without TTP; she is neurovascularly intact and exhibits no focal neurologic deficits with full muscle strength against resistance. W/u to include CBC, BMP, CBG, and CT head w/o contrast. EKG unchanged from 06/29/2012. Patient seen also by Dr. Adriana Simas with whom this w/u was discussed. Patient with no complaints at this time.   Patient's labs without significant findings and CT head without acute changes such as hemorrhage, mass, or acute infarct. Patient hemodynamically stable, neurologically unchanged from initial presentation. Patient appropriate for d/c with PCP follow up to ensure resolution of symptoms. Ibuprofen recommended for headache and indications for ED return discussed. Patient states comfort and understanding; requesting a work note so that she may remain home  for the rest of today and tomorrow.  Patient work up and management  discussed with Dr. Adriana Simas who is in agreement.       Antony Madura, PA-C 09/08/12 [id number]

## 2012-09-07 NOTE — ED Notes (Signed)
Pt states she was using walker today after recent L sided BKA and began having headache, L arm numbness and middle back pain. 7/10 at the time. Symptoms started @ 11:00am this morning. Denies chest pain. Respirations unlabored. Pt is conscious alert and oriented x4. No neurological deficits noted.

## 2012-09-09 NOTE — ED Provider Notes (Signed)
Medical screening examination/treatment/procedure(s) were conducted as a shared visit with non-physician practitioner(s) and myself.  I personally evaluated the patient during the encounter.  No neuro deficits.  CT head negative  [name], MD 09/09/12 [phone]

## 2012-09-10 ENCOUNTER — Ambulatory Visit: Payer: Managed Care, Other (non HMO) | Admitting: *Deleted

## 2012-09-15 ENCOUNTER — Ambulatory Visit: Payer: Managed Care, Other (non HMO) | Admitting: Physical Therapy

## 2012-09-16 ENCOUNTER — Ambulatory Visit: Payer: Managed Care, Other (non HMO) | Attending: Orthopedic Surgery | Admitting: Physical Therapy

## 2012-09-16 DIAGNOSIS — IMO0001 Reserved for inherently not codable concepts without codable children: Secondary | ICD-10-CM | POA: Insufficient documentation

## 2012-09-16 DIAGNOSIS — R269 Unspecified abnormalities of gait and mobility: Secondary | ICD-10-CM | POA: Insufficient documentation

## 2012-09-16 DIAGNOSIS — R5381 Other malaise: Secondary | ICD-10-CM | POA: Insufficient documentation

## 2012-09-16 DIAGNOSIS — S88119A Complete traumatic amputation at level between knee and ankle, unspecified lower leg, initial encounter: Secondary | ICD-10-CM | POA: Insufficient documentation

## 2012-09-16 DIAGNOSIS — M6281 Muscle weakness (generalized): Secondary | ICD-10-CM | POA: Insufficient documentation

## 2012-09-20 ENCOUNTER — Ambulatory Visit: Payer: Managed Care, Other (non HMO) | Admitting: Physical Therapy

## 2012-09-23 ENCOUNTER — Ambulatory Visit: Payer: Self-pay | Admitting: Neurology

## 2012-09-24 ENCOUNTER — Ambulatory Visit: Payer: Managed Care, Other (non HMO) | Admitting: Physical Therapy

## 2012-09-27 ENCOUNTER — Ambulatory Visit: Payer: Managed Care, Other (non HMO) | Admitting: Physical Therapy

## 2012-09-29 ENCOUNTER — Ambulatory Visit: Payer: Managed Care, Other (non HMO) | Admitting: Physical Therapy

## 2012-10-04 ENCOUNTER — Ambulatory Visit: Payer: Managed Care, Other (non HMO) | Admitting: Physical Therapy

## 2012-10-06 ENCOUNTER — Ambulatory Visit: Payer: Managed Care, Other (non HMO) | Admitting: Physical Therapy

## 2012-10-12 ENCOUNTER — Ambulatory Visit: Payer: Managed Care, Other (non HMO) | Admitting: Physical Therapy

## 2012-10-15 ENCOUNTER — Ambulatory Visit: Payer: Managed Care, Other (non HMO) | Admitting: Physical Therapy

## 2012-10-18 ENCOUNTER — Ambulatory Visit: Payer: Managed Care, Other (non HMO) | Attending: Orthopedic Surgery | Admitting: Physical Therapy

## 2012-10-18 DIAGNOSIS — R5381 Other malaise: Secondary | ICD-10-CM | POA: Insufficient documentation

## 2012-10-18 DIAGNOSIS — M6281 Muscle weakness (generalized): Secondary | ICD-10-CM | POA: Insufficient documentation

## 2012-10-18 DIAGNOSIS — R269 Unspecified abnormalities of gait and mobility: Secondary | ICD-10-CM | POA: Insufficient documentation

## 2012-10-18 DIAGNOSIS — IMO0001 Reserved for inherently not codable concepts without codable children: Secondary | ICD-10-CM | POA: Insufficient documentation

## 2012-10-18 DIAGNOSIS — S88119A Complete traumatic amputation at level between knee and ankle, unspecified lower leg, initial encounter: Secondary | ICD-10-CM | POA: Insufficient documentation

## 2012-10-20 ENCOUNTER — Ambulatory Visit: Payer: Managed Care, Other (non HMO) | Admitting: Physical Therapy

## 2012-10-25 ENCOUNTER — Ambulatory Visit: Payer: Managed Care, Other (non HMO) | Admitting: Physical Therapy

## 2012-10-28 ENCOUNTER — Ambulatory Visit: Payer: Managed Care, Other (non HMO) | Admitting: Physical Therapy

## 2012-11-01 ENCOUNTER — Ambulatory Visit: Payer: Managed Care, Other (non HMO) | Admitting: Physical Therapy

## 2012-11-05 ENCOUNTER — Ambulatory Visit: Payer: Managed Care, Other (non HMO) | Admitting: Physical Therapy

## 2012-11-08 ENCOUNTER — Ambulatory Visit: Payer: Managed Care, Other (non HMO) | Admitting: Physical Therapy

## 2012-11-10 ENCOUNTER — Ambulatory Visit: Payer: Managed Care, Other (non HMO) | Admitting: Physical Therapy

## 2012-11-15 ENCOUNTER — Ambulatory Visit: Payer: Managed Care, Other (non HMO) | Admitting: Physical Therapy

## 2012-11-18 ENCOUNTER — Ambulatory Visit: Payer: Managed Care, Other (non HMO) | Attending: Orthopedic Surgery | Admitting: Physical Therapy

## 2012-11-18 DIAGNOSIS — IMO0001 Reserved for inherently not codable concepts without codable children: Secondary | ICD-10-CM | POA: Insufficient documentation

## 2012-11-18 DIAGNOSIS — M6281 Muscle weakness (generalized): Secondary | ICD-10-CM | POA: Insufficient documentation

## 2012-11-18 DIAGNOSIS — R5381 Other malaise: Secondary | ICD-10-CM | POA: Insufficient documentation

## 2012-11-18 DIAGNOSIS — S88119A Complete traumatic amputation at level between knee and ankle, unspecified lower leg, initial encounter: Secondary | ICD-10-CM | POA: Insufficient documentation

## 2012-11-18 DIAGNOSIS — R269 Unspecified abnormalities of gait and mobility: Secondary | ICD-10-CM | POA: Insufficient documentation

## 2012-11-22 ENCOUNTER — Ambulatory Visit: Payer: Managed Care, Other (non HMO) | Admitting: Physical Therapy

## 2012-11-24 ENCOUNTER — Ambulatory Visit: Payer: Managed Care, Other (non HMO) | Admitting: Physical Therapy

## 2012-12-02 ENCOUNTER — Ambulatory Visit: Payer: Managed Care, Other (non HMO) | Admitting: Physical Therapy

## 2012-12-08 ENCOUNTER — Ambulatory Visit: Payer: Managed Care, Other (non HMO) | Admitting: Physical Therapy

## 2012-12-15 ENCOUNTER — Ambulatory Visit: Payer: Managed Care, Other (non HMO) | Admitting: Physical Therapy

## 2012-12-15 ENCOUNTER — Encounter: Payer: Managed Care, Other (non HMO) | Admitting: Physical Therapy

## 2012-12-16 ENCOUNTER — Ambulatory Visit: Payer: Managed Care, Other (non HMO) | Admitting: Physical Therapy

## 2013-01-09 ENCOUNTER — Emergency Department (HOSPITAL_COMMUNITY): Payer: Self-pay

## 2013-01-09 ENCOUNTER — Encounter (HOSPITAL_COMMUNITY): Payer: Self-pay

## 2013-01-09 ENCOUNTER — Inpatient Hospital Stay (HOSPITAL_COMMUNITY)
Admission: EM | Admit: 2013-01-09 | Discharge: 2013-01-13 | DRG: 065 | Disposition: A | Discharge status: Rehabilitation Facility | Payer: Self-pay | Attending: Internal Medicine | Admitting: Internal Medicine

## 2013-01-09 DIAGNOSIS — S88119A Complete traumatic amputation at level between knee and ankle, unspecified lower leg, initial encounter: Secondary | ICD-10-CM

## 2013-01-09 DIAGNOSIS — R29898 Other symptoms and signs involving the musculoskeletal system: Secondary | ICD-10-CM

## 2013-01-09 DIAGNOSIS — IMO0002 Reserved for concepts with insufficient information to code with codable children: Secondary | ICD-10-CM | POA: Diagnosis present

## 2013-01-09 DIAGNOSIS — K219 Gastro-esophageal reflux disease without esophagitis: Secondary | ICD-10-CM | POA: Diagnosis present

## 2013-01-09 DIAGNOSIS — K5900 Constipation, unspecified: Secondary | ICD-10-CM | POA: Diagnosis present

## 2013-01-09 DIAGNOSIS — I2510 Atherosclerotic heart disease of native coronary artery without angina pectoris: Secondary | ICD-10-CM | POA: Diagnosis present

## 2013-01-09 DIAGNOSIS — Z8673 Personal history of transient ischemic attack (TIA), and cerebral infarction without residual deficits: Secondary | ICD-10-CM

## 2013-01-09 DIAGNOSIS — K449 Diaphragmatic hernia without obstruction or gangrene: Secondary | ICD-10-CM | POA: Diagnosis present

## 2013-01-09 DIAGNOSIS — I639 Cerebral infarction, unspecified: Secondary | ICD-10-CM | POA: Diagnosis present

## 2013-01-09 DIAGNOSIS — E785 Hyperlipidemia, unspecified: Secondary | ICD-10-CM | POA: Diagnosis present

## 2013-01-09 DIAGNOSIS — E1149 Type 2 diabetes mellitus with other diabetic neurological complication: Secondary | ICD-10-CM | POA: Diagnosis present

## 2013-01-09 DIAGNOSIS — R5381 Other malaise: Secondary | ICD-10-CM | POA: Diagnosis present

## 2013-01-09 DIAGNOSIS — F17200 Nicotine dependence, unspecified, uncomplicated: Secondary | ICD-10-CM | POA: Diagnosis present

## 2013-01-09 DIAGNOSIS — I10 Essential (primary) hypertension: Secondary | ICD-10-CM | POA: Diagnosis present

## 2013-01-09 DIAGNOSIS — I25110 Atherosclerotic heart disease of native coronary artery with unstable angina pectoris: Secondary | ICD-10-CM | POA: Diagnosis present

## 2013-01-09 DIAGNOSIS — I6350 Cerebral infarction due to unspecified occlusion or stenosis of unspecified cerebral artery: Principal | ICD-10-CM | POA: Diagnosis present

## 2013-01-09 DIAGNOSIS — R29810 Facial weakness: Secondary | ICD-10-CM | POA: Diagnosis present

## 2013-01-09 DIAGNOSIS — I651 Occlusion and stenosis of basilar artery: Secondary | ICD-10-CM | POA: Diagnosis present

## 2013-01-09 DIAGNOSIS — K589 Irritable bowel syndrome without diarrhea: Secondary | ICD-10-CM | POA: Diagnosis present

## 2013-01-09 DIAGNOSIS — M6282 Rhabdomyolysis: Secondary | ICD-10-CM

## 2013-01-09 DIAGNOSIS — E876 Hypokalemia: Secondary | ICD-10-CM | POA: Diagnosis present

## 2013-01-09 DIAGNOSIS — E1142 Type 2 diabetes mellitus with diabetic polyneuropathy: Secondary | ICD-10-CM | POA: Diagnosis present

## 2013-01-09 DIAGNOSIS — Z9181 History of falling: Secondary | ICD-10-CM

## 2013-01-09 DIAGNOSIS — E1140 Type 2 diabetes mellitus with diabetic neuropathy, unspecified: Secondary | ICD-10-CM

## 2013-01-09 DIAGNOSIS — G8190 Hemiplegia, unspecified affecting unspecified side: Secondary | ICD-10-CM | POA: Diagnosis present

## 2013-01-09 LAB — CBC WITH DIFFERENTIAL/PLATELET
Basophils Relative: 0 % (ref 0–1)
HCT: 35.2 % — ABNORMAL LOW (ref 36.0–46.0)
Hemoglobin: 11.9 g/dL — ABNORMAL LOW (ref 12.0–15.0)
Lymphs Abs: 3 10*3/uL (ref 0.7–4.0)
MCH: 27.6 pg (ref 26.0–34.0)
MCHC: 33.8 g/dL (ref 30.0–36.0)
Monocytes Absolute: 0.6 10*3/uL (ref 0.1–1.0)
Monocytes Relative: 5 % (ref 3–12)
Neutro Abs: 7.9 10*3/uL — ABNORMAL HIGH (ref 1.7–7.7)
RBC: 4.31 MIL/uL (ref 3.87–5.11)

## 2013-01-09 LAB — COMPREHENSIVE METABOLIC PANEL
Albumin: 2.7 g/dL — ABNORMAL LOW (ref 3.5–5.2)
Alkaline Phosphatase: 121 U/L — ABNORMAL HIGH (ref 39–117)
BUN: 15 mg/dL (ref 6–23)
Chloride: 95 mEq/L — ABNORMAL LOW (ref 96–112)
Creatinine, Ser: 0.67 mg/dL (ref 0.50–1.10)
GFR calc Af Amer: >90 mL/min (ref >90)
GFR calc non Af Amer: >90 mL/min (ref >90)
Glucose, Bld: 185 mg/dL — ABNORMAL HIGH (ref 70–99)
Potassium: 2.9 mEq/L — ABNORMAL LOW (ref 3.5–5.1)
Total Bilirubin: 0.4 mg/dL (ref 0.3–1.2)

## 2013-01-09 LAB — GLUCOSE, CAPILLARY: Glucose-Capillary: 210 mg/dL — ABNORMAL HIGH (ref 70–99)

## 2013-01-09 LAB — CK: Total CK: 838 U/L — ABNORMAL HIGH (ref 7–177)

## 2013-01-09 MED ORDER — SODIUM CHLORIDE 0.9 % IV BOLUS (SEPSIS): 1000.0000 mL | Freq: Once | INTRAVENOUS | Status: DC

## 2013-01-09 MED ORDER — SODIUM CHLORIDE 0.9 % IV BOLUS (SEPSIS)
2000.0000 mL | Freq: Once | INTRAVENOUS | Status: AC
  Administered 2013-01-09: 2000 mL via INTRAVENOUS

## 2013-01-09 MED ORDER — SODIUM CHLORIDE 0.9 % IV SOLN: INTRAVENOUS | Status: DC

## 2013-01-09 MED ORDER — POTASSIUM CHLORIDE 10 MEQ/100ML IV SOLN
10.0000 meq | Freq: Once | INTRAVENOUS | Status: AC
  Administered 2013-01-09: 10 meq via INTRAVENOUS
  Filled 2013-01-09: qty 100

## 2013-01-09 NOTE — ED Notes (Signed)
Pt does not want IV in hand.  Attempted IV start in arms x 2.  Will have another RN attempt.

## 2013-01-09 NOTE — ED Notes (Addendum)
Pt from home alone with L BKA.  Pt states she slipped and fell on Wednesday and was found Friday by her sister who helped her get up.  Pt states she slipped again this morning and ended up on floor.  Pt states she was able to get to a phone and call her niece, but she told her she wouldn't be by until after church.  Niece went to pt's house after church and got pt up.  Upon PTAR arrival pt was sitting on her bed.  Pt is A & O x 4.  Pt denied pain with PTAR, but now c/o R leg pain.

## 2013-01-09 NOTE — ED Notes (Signed)
RN asked pt about providing a urine sample. [name] stated she was unable to void at this time, will notify RN when she can.

## 2013-01-09 NOTE — ED Notes (Signed)
Pt in room, currently talking on telephone, No acute distress observed.

## 2013-01-09 NOTE — ED Provider Notes (Signed)
CSN: [id number]     Arrival date & time 01/09/13  1642 History     First MD Initiated Contact with Patient 01/09/13 1743     Chief Complaint  Patient presents with  . Fall   (Consider location/radiation/quality/duration/timing/severity/associated sxs/prior Treatment) HPI This 57 year old diabetic with a left below-the-knee amputation within the last few months lives at home alone but has been too weak to take care of herself the last several days, 4 days ago she slipped and fell and was too weak in her right leg to get up and take care of herself or Wednesday Thursday and Friday she laid on the floor until someone found her, helped her up but this morning when the patient went to the bathroom she was too weak to get up off the commode until her family found her after church this afternoon and brought her to the emergency department because the patient is too weak to care for self at home, patient had no trauma today, there is no head or neck injury no headache no altered mental status no new focal weakness or numbness to her arms no chest pain cough shortness breath abdominal pain vomiting rash or other concerns. She is no neck pain no back pain no pain to her right leg. She just feels like her right leg is too weak for her to stand up on her today but has no numbness to the right leg. She is no weakness or numbness to her right arm. Her weakness is gradually worsening over several days. Past Medical History  Diagnosis Date  . Atypical chest pain     a. non obstructive by cath in 2008 and 2010;  b. 02/2012 Myoview: non-ischemic, EF 57%  . HTN (hypertension)   . Hyperlipidemia   . Syncope and collapse     a. near-syncopal episode in November 2008;  b. 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  . CVA (cerebral infarction)     a.  Small right parietal noted incidentally 04/2007;  b. right sided embolic CVA 05/2012;  c. 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 Peripheral neuropathy   . Obesity, unspecified   . Esophageal reflux   . Diabetes mellitus, type II   . Diaphragmatic hernia without mention of obstruction or gangrene   . Irritable bowel syndrome   . Morbid obesity   . Cellulitis     a. 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 Stroke     2013-'no residual'  . Vertigo   . Dry skin   . Constipation   . Tobacco abuse    Past Surgical History  Procedure Laterality Date  . Invasive electrophysiologic study  5/09    followed by insertion of an implantable loop recorder. S/p removal   . Colonoscopy    . Cholecystectomy    . Multiple toe surgeries    . Cardiac catheterization      LAD 30%, circumflex 50%, OM 75%, RI 60% with small branch 80%, dominant RCA 60%, EF 45-50%  . I&d extremity  12/23/2011    Procedure: IRRIGATION AND DEBRIDEMENT EXTREMITY;  Surgeon: Verlee Rossetti, MD;  Location: [facility] OR;  Service: Orthopedics;  Laterality: Left;  Left Foot  . I&d extremity  12/26/2011    Procedure: IRRIGATION AND DEBRIDEMENT EXTREMITY;  Surgeon: Toni Arthurs, MD;  Location: [facility] OR;  Service: Orthopedics;  Laterality: Left;  LEFT FOOT  I&D WITH POSSIBLE WOUND VAC APPLICATION, POSSIBLE LEFT FIRST RAY AMPUTATION  . Amputation  12/30/2011    Procedure: AMPUTATION RAY;  Surgeon: Toni Arthurs, MD;  Location: [facility] OR;  Service: Orthopedics;  Laterality: Left;  LEFT FIRST RAY AMPUTATION  . Eye surgery      cataract  . Amputation  01/13/2012    Procedure: AMPUTATION BELOW KNEE;  Surgeon: Toni Arthurs, MD;  Location: [facility] OR;  Service: Orthopedics;  Laterality: Left;  . Amputation  03/04/2012    Procedure: AMPUTATION BELOW KNEE;  Surgeon: Toni Arthurs, MD;  Location: [facility] OR;  Service: Orthopedics;  Laterality: Left;  Revision of Left Below Knee Amputation  . Tee without cardioversion N/A 07/07/2012    Procedure: TRANSESOPHAGEAL ECHOCARDIOGRAM (TEE);  Surgeon: Lewayne Bunting, MD;  Location: [facility] ENDOSCOPY;  Service:  Cardiovascular;  Laterality: N/A;   Family History  Problem Relation Age of Onset  . Pneumonia Mother   . Gallbladder disease Mother     cancer  . Heart failure Mother   . Diabetes Father   . Coronary artery disease Father   . Stroke Neg Hx    History  Substance Use Topics  . Smoking status: Current Every Day Smoker -- 0.25 packs/day for 40 years    Types: Cigarettes  . Smokeless tobacco: Never Used  . 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   OB History   Grav Para Term Preterm Abortions TAB SAB Ect Mult Living                 Review of Systems 10 Systems reviewed and are negative for acute change except as noted in the HPI. Allergies  Banana; Penicillins; and Strawberry  Home Medications   No current outpatient prescriptions on file. BP 155/79  Pulse 82  Temp(Src) 98.3 F (36.8 C) (Oral)  Resp 19  Ht 5\' 6"  (1.676 m)  Wt 194 lb 0.1 oz (88 kg)  BMI 31.33 kg/m2  SpO2 100% Physical Exam  Nursing note and vitals reviewed. Constitutional:  Awake, alert, nontoxic appearance.  HENT:  Head: Atraumatic.  Eyes: Right eye exhibits no discharge. Left eye exhibits no discharge.  Neck: Neck supple.  Cervical spine nontender  Cardiovascular: Normal rate and regular rhythm.   No murmur heard. Pulmonary/Chest: Effort normal and breath sounds normal. No respiratory distress. She has no wheezes. She has no rales. She exhibits no tenderness.  Abdominal: Soft. There is no tenderness. There is no rebound.  Musculoskeletal: She exhibits no edema and no tenderness.  Baseline ROM, no obvious new focal weakness. Back nontender.  Neurological: She is alert.  Mental status and motor strength arms 5/5 appears baseline for patient but right leg strength is 3/5 weaker than normal for patient with normal light touch to right leg, capillary refill less than 2 seconds right foot, and mental status appears  to be baseline, major cranial nerves appear to be intact, no facial asymmetry, extraocular movements intact, peripheral visual fields full to confrontation, no pronator drift in arms, normal light touch in arms, normal bilateral finger to nose testing, too weak to try to stand up on right leg.  Skin: No rash noted.  Psychiatric: She has a normal mood and affect.    ED Course   Procedures (including critical care time) ECG: Normal sinus rhythm, ventricular rate 99, normal axis, septal Q waves, artifact, nonspecific ST and  T changes, no significant change noted compared with April 2014  Patient / Family / Caregiver informed of clinical course, understand medical decision-making process, and agree with plan. Labs Reviewed  GLUCOSE, CAPILLARY - Abnormal; Notable for the following:    Glucose-Capillary 210 (*)    All other components within normal limits  CBC WITH DIFFERENTIAL - Abnormal; Notable for the following:    WBC 11.5 (*)    Hemoglobin 11.9 (*)    HCT 35.2 (*)    Neutro Abs 7.9 (*)    All other components within normal limits  COMPREHENSIVE METABOLIC PANEL - Abnormal; Notable for the following:    Sodium 134 (*)    Potassium 2.9 (*)    Chloride 95 (*)    Glucose, Bld 185 (*)    Albumin 2.7 (*)    Alkaline Phosphatase 121 (*)    All other components within normal limits  URINALYSIS, ROUTINE W REFLEX MICROSCOPIC - Abnormal; Notable for the following:    APPearance HAZY (*)    Hgb urine dipstick TRACE (*)    Leukocytes, UA SMALL (*)    All other components within normal limits  CK - Abnormal; Notable for the following:    Total CK 838 (*)    All other components within normal limits  HEMOGLOBIN A1C - Abnormal; Notable for the following:    Hemoglobin A1C 9.2 (*)    Mean Plasma Glucose 217 (*)    All other components within normal limits  BASIC METABOLIC PANEL - Abnormal; Notable for the following:    Potassium 3.4 (*)    Glucose, Bld 173 (*)    All other components within  normal limits  LIPID PANEL - Abnormal; Notable for the following:    Cholesterol 202 (*)    LDL Cholesterol 132 (*)    All other components within normal limits  GLUCOSE, CAPILLARY - Abnormal; Notable for the following:    Glucose-Capillary 159 (*)    All other components within normal limits  GLUCOSE, CAPILLARY - Abnormal; Notable for the following:    Glucose-Capillary 139 (*)    All other components within normal limits  GLUCOSE, CAPILLARY - Abnormal; Notable for the following:    Glucose-Capillary 152 (*)    All other components within normal limits  GLUCOSE, CAPILLARY - Abnormal; Notable for the following:    Glucose-Capillary 164 (*)    All other components within normal limits  GLUCOSE, CAPILLARY - Abnormal; Notable for the following:    Glucose-Capillary 128 (*)    All other components within normal limits  GLUCOSE, CAPILLARY - Abnormal; Notable for the following:    Glucose-Capillary 173 (*)    All other components within normal limits  GLUCOSE, CAPILLARY - Abnormal; Notable for the following:    Glucose-Capillary 140 (*)    All other components within normal limits  CBC  MAGNESIUM  URINE MICROSCOPIC-ADD ON  CBC  BASIC METABOLIC PANEL  CG4 I-STAT (LACTIC ACID)  POCT I-STAT TROPONIN I   Dg Chest 2 View  01/10/2013   *RADIOLOGY REPORT*  Clinical Data: Weakness  CHEST - 2 VIEW  Comparison: 01/09/2013  Findings: The cardiac shadow is stable.  The inspiratory effort is poor although no focal infiltrate is seen.  No acute bony abnormality is noted.  IMPRESSION: No acute abnormalities seen.   Original Report Authenticated By: Alcide Clever, M.D.   Mr Brain Wo Contrast  01/10/2013   *RADIOLOGY REPORT*  Clinical Data:  Stroke.  Right leg weakness.  Abnormal CT scan  of the head.  MRI HEAD WITHOUT CONTRAST MRA HEAD WITHOUT CONTRAST  Technique:  Multiplanar, multiecho pulse sequences of the brain and surrounding structures were obtained without intravenous contrast. Angiographic  images of the head were obtained using MRA technique without contrast.  Comparison:  CT head without contrast 01/09/2013.  MRI brain and MRA head 06/25/2012.  MRI HEAD  Findings:  A 12 mm acute non hemorrhagic infarct is present within the left putamen.  The CT abnormalities correspond with chronic lacunar infarcts involving the left internal capsule and right thalamus.  These are new since the 04/22/2/14 CT, but not acute. Additional remote lacunar infarcts are present within the basal ganglia bilaterally.  A remote lacunar infarct in the left cerebellum is again noted.  Moderate periventricular white matter changes are otherwise stable.  There is T2 signal associated with the acute infarct.  The ventricles are of normal size.  Flow is present in the major intracranial arteries.  The patient is status post bilateral lens extractions.  The paranasal sinuses and mastoid air cells are clear.  IMPRESSION:  1.  Acute non hemorrhagic infarct involving the left putamen. 2.  Additional lacunar infarcts within the basal ganglia identified on the CT scan are not acute, but are new since April 2014. 3.  Remote lacunar infarcts are present in the basal ganglia bilaterally as well as the left cerebellum. 4.  Age advanced periventricular white matter changes are consistent with small vessel disease as well.  MRA HEAD  Findings: Mild atherosclerotic changes are present within the cavernous carotid arteries bilaterally.  There is marked tortuosity of the high right cervical ICA without stenosis.  The A1 and M1 segments demonstrate mild irregularity.  The anterior communicating artery is patent.  There is marked attenuation of distal MCA and ACA branch vessels bilaterally without significant proximal stenosis or occlusion.  The left vertebral artery is dominant.  There is marked attenuation of the distal right vertebral artery.  The basilar artery demonstrates a moderate proximal stenosis. The left vertebral artery is of fetal  type.  There is a high-grade stenosis of the distal aspect of the right P1 segment. A prominent right posterior communicating artery is present.  Marked distal small vessel attenuation is evident.  IMPRESSION:  1.  Advanced small vessel disease. 2.  Moderate proximal basilar artery stenosis. 3.  High-grade stenosis of the right P1 segment.  A right posterior communicating artery is present. 4.  Atherosclerotic changes of the cavernous carotid arteries bilaterally without significant stenoses. 5.  Marked tortuosity of the high cervical right ICA without stenosis.  This is most commonly seen in setting of chronic hypertension.   Original Report Authenticated By: Marin Roberts, M.D.   Mr Mra Head/brain Wo Cm  01/10/2013   *RADIOLOGY REPORT*  Clinical Data:  Stroke.  Right leg weakness.  Abnormal CT scan of the head.  MRI HEAD WITHOUT CONTRAST MRA HEAD WITHOUT CONTRAST  Technique:  Multiplanar, multiecho pulse sequences of the brain and surrounding structures were obtained without intravenous contrast. Angiographic images of the head were obtained using MRA technique without contrast.  Comparison:  CT head without contrast 01/09/2013.  MRI brain and MRA head 06/25/2012.  MRI HEAD  Findings:  A 12 mm acute non hemorrhagic infarct is present within the left putamen.  The CT abnormalities correspond with chronic lacunar infarcts involving the left internal capsule and right thalamus.  These are new since the 04/22/2/14 CT, but not acute. Additional remote lacunar infarcts are present within the basal  ganglia bilaterally.  A remote lacunar infarct in the left cerebellum is again noted.  Moderate periventricular white matter changes are otherwise stable.  There is T2 signal associated with the acute infarct.  The ventricles are of normal size.  Flow is present in the major intracranial arteries.  The patient is status post bilateral lens extractions.  The paranasal sinuses and mastoid air cells are clear.   IMPRESSION:  1.  Acute non hemorrhagic infarct involving the left putamen. 2.  Additional lacunar infarcts within the basal ganglia identified on the CT scan are not acute, but are new since April 2014. 3.  Remote lacunar infarcts are present in the basal ganglia bilaterally as well as the left cerebellum. 4.  Age advanced periventricular white matter changes are consistent with small vessel disease as well.  MRA HEAD  Findings: Mild atherosclerotic changes are present within the cavernous carotid arteries bilaterally.  There is marked tortuosity of the high right cervical ICA without stenosis.  The A1 and M1 segments demonstrate mild irregularity.  The anterior communicating artery is patent.  There is marked attenuation of distal MCA and ACA branch vessels bilaterally without significant proximal stenosis or occlusion.  The left vertebral artery is dominant.  There is marked attenuation of the distal right vertebral artery.  The basilar artery demonstrates a moderate proximal stenosis. The left vertebral artery is of fetal type.  There is a high-grade stenosis of the distal aspect of the right P1 segment. A prominent right posterior communicating artery is present.  Marked distal small vessel attenuation is evident.  IMPRESSION:  1.  Advanced small vessel disease. 2.  Moderate proximal basilar artery stenosis. 3.  High-grade stenosis of the right P1 segment.  A right posterior communicating artery is present. 4.  Atherosclerotic changes of the cavernous carotid arteries bilaterally without significant stenoses. 5.  Marked tortuosity of the high cervical right ICA without stenosis.  This is most commonly seen in setting of chronic hypertension.   Original Report Authenticated By: Marin Roberts, M.D.   1. Right leg weakness   2. Rhabdomyolysis   3. CVA (cerebral infarction)   4. DM type 2, uncontrolled, with neuropathy   5. H/O: CVA (cerebrovascular accident)   6. HTN (hypertension)   7. Hypokalemia      MDM  The patient appears reasonably stabilized for admission considering the current resources, flow, and capabilities available in the ED at this time, and I doubt any other [facility] requiring further screening and/or treatment in the ED prior to admission.  Hurman Horn, MD 01/11/13 2112

## 2013-01-09 NOTE — ED Notes (Signed)
Family to nurses station concerned about pt's hand writing would like ct scan preformed for stroke. Dr.Bednar informed.

## 2013-01-10 ENCOUNTER — Inpatient Hospital Stay (HOSPITAL_COMMUNITY): Payer: Self-pay

## 2013-01-10 ENCOUNTER — Encounter (HOSPITAL_COMMUNITY): Payer: Self-pay | Admitting: *Deleted

## 2013-01-10 DIAGNOSIS — I517 Cardiomegaly: Secondary | ICD-10-CM

## 2013-01-10 DIAGNOSIS — Z8673 Personal history of transient ischemic attack (TIA), and cerebral infarction without residual deficits: Secondary | ICD-10-CM

## 2013-01-10 DIAGNOSIS — I10 Essential (primary) hypertension: Secondary | ICD-10-CM

## 2013-01-10 DIAGNOSIS — R29898 Other symptoms and signs involving the musculoskeletal system: Secondary | ICD-10-CM

## 2013-01-10 DIAGNOSIS — I6350 Cerebral infarction due to unspecified occlusion or stenosis of unspecified cerebral artery: Principal | ICD-10-CM

## 2013-01-10 DIAGNOSIS — G8190 Hemiplegia, unspecified affecting unspecified side: Secondary | ICD-10-CM | POA: Diagnosis present

## 2013-01-10 LAB — URINE MICROSCOPIC-ADD ON

## 2013-01-10 LAB — BASIC METABOLIC PANEL
CO2: 26 mEq/L (ref 19–32)
GFR calc non Af Amer: >90 mL/min (ref >90)
Glucose, Bld: 173 mg/dL — ABNORMAL HIGH (ref 70–99)
Potassium: 3.4 mEq/L — ABNORMAL LOW (ref 3.5–5.1)
Sodium: 138 mEq/L (ref 135–145)

## 2013-01-10 LAB — URINALYSIS, ROUTINE W REFLEX MICROSCOPIC
Ketones, ur: NEGATIVE mg/dL
Nitrite: NEGATIVE
Protein, ur: NEGATIVE mg/dL
Urobilinogen, UA: 1 mg/dL (ref 0.0–1.0)

## 2013-01-10 LAB — GLUCOSE, CAPILLARY
Glucose-Capillary: 139 mg/dL — ABNORMAL HIGH (ref 70–99)
Glucose-Capillary: 164 mg/dL — ABNORMAL HIGH (ref 70–99)

## 2013-01-10 LAB — LIPID PANEL
HDL: 42 mg/dL (ref >39)
LDL Cholesterol: 132 mg/dL — ABNORMAL HIGH (ref 0–99)
Triglycerides: 138 mg/dL (ref <150)

## 2013-01-10 LAB — CBC
Hemoglobin: 12.3 g/dL (ref 12.0–15.0)
RBC: 4.46 MIL/uL (ref 3.87–5.11)
WBC: 9.7 10*3/uL (ref 4.0–10.5)

## 2013-01-10 LAB — HEMOGLOBIN A1C
Hgb A1c MFr Bld: 9.2 % — ABNORMAL HIGH (ref <5.7)
Mean Plasma Glucose: 217 mg/dL — ABNORMAL HIGH (ref <117)

## 2013-01-10 MED ORDER — ACETAMINOPHEN 325 MG PO TABS: 650.0000 mg | ORAL_TABLET | ORAL | Status: DC | PRN

## 2013-01-10 MED ORDER — PRO-STAT SUGAR FREE PO LIQD
30.0000 mL | Freq: Every day | ORAL | Status: DC
  Administered 2013-01-11 – 2013-01-13 (×3): 30 mL via ORAL
  Filled 2013-01-10 (×4): qty 30

## 2013-01-10 MED ORDER — INSULIN GLARGINE 100 UNIT/ML ~~LOC~~ SOLN
15.0000 [IU] | Freq: Every day | SUBCUTANEOUS | Status: DC
  Administered 2013-01-10 – 2013-01-11 (×2): 15 [IU] via SUBCUTANEOUS
  Filled 2013-01-10 (×3): qty 0.15

## 2013-01-10 MED ORDER — POTASSIUM CHLORIDE 10 MEQ/100ML IV SOLN
10.0000 meq | INTRAVENOUS | Status: AC
  Administered 2013-01-10 (×6): 10 meq via INTRAVENOUS
  Filled 2013-01-10: qty 100

## 2013-01-10 MED ORDER — SIMVASTATIN 40 MG PO TABS
40.0000 mg | ORAL_TABLET | Freq: Every evening | ORAL | Status: DC
  Administered 2013-01-10 – 2013-01-12 (×3): 40 mg via ORAL
  Filled 2013-01-10 (×4): qty 1

## 2013-01-10 MED ORDER — ENOXAPARIN SODIUM 40 MG/0.4ML ~~LOC~~ SOLN
40.0000 mg | SUBCUTANEOUS | Status: DC
  Administered 2013-01-10 – 2013-01-13 (×4): 40 mg via SUBCUTANEOUS
  Filled 2013-01-10 (×4): qty 0.4

## 2013-01-10 MED ORDER — ACETAMINOPHEN 325 MG PO TABS
650.0000 mg | ORAL_TABLET | ORAL | Status: DC | PRN
  Administered 2013-01-10 – 2013-01-12 (×4): 650 mg via ORAL
  Filled 2013-01-10 (×4): qty 2

## 2013-01-10 MED ORDER — ASPIRIN 325 MG PO TABS
325.0000 mg | ORAL_TABLET | Freq: Every day | ORAL | Status: DC
  Filled 2013-01-10: qty 1

## 2013-01-10 MED ORDER — ASPIRIN-DIPYRIDAMOLE ER 25-200 MG PO CP12
1.0000 | ORAL_CAPSULE | Freq: Every day | ORAL | Status: DC
  Filled 2013-01-10: qty 1

## 2013-01-10 MED ORDER — SENNOSIDES-DOCUSATE SODIUM 8.6-50 MG PO TABS: 1.0000 | ORAL_TABLET | Freq: Every evening | ORAL | Status: DC | PRN

## 2013-01-10 MED ORDER — LISINOPRIL 20 MG PO TABS
20.0000 mg | ORAL_TABLET | Freq: Every day | ORAL | Status: DC
  Administered 2013-01-10 – 2013-01-13 (×4): 20 mg via ORAL
  Filled 2013-01-10 (×4): qty 1

## 2013-01-10 MED ORDER — SODIUM CHLORIDE 0.9 % IV SOLN: INTRAVENOUS | Status: DC

## 2013-01-10 MED ORDER — ASPIRIN 300 MG RE SUPP
300.0000 mg | Freq: Every day | RECTAL | Status: DC
  Filled 2013-01-10: qty 1

## 2013-01-10 MED ORDER — INSULIN ASPART 100 UNIT/ML ~~LOC~~ SOLN
0.0000 [IU] | Freq: Three times a day (TID) | SUBCUTANEOUS | Status: DC
  Administered 2013-01-10: 2 [IU] via SUBCUTANEOUS
  Administered 2013-01-10: 1 [IU] via SUBCUTANEOUS
  Administered 2013-01-10: 09:00:00 via SUBCUTANEOUS
  Administered 2013-01-11: 2 [IU] via SUBCUTANEOUS
  Administered 2013-01-11 (×2): 1 [IU] via SUBCUTANEOUS
  Administered 2013-01-12: 3 [IU] via SUBCUTANEOUS
  Administered 2013-01-12 (×2): 1 [IU] via SUBCUTANEOUS
  Administered 2013-01-13: 2 [IU] via SUBCUTANEOUS

## 2013-01-10 MED ORDER — SODIUM CHLORIDE 0.9 % IV SOLN
INTRAVENOUS | Status: DC
  Administered 2013-01-10: 04:00:00 via INTRAVENOUS

## 2013-01-10 MED ORDER — ASPIRIN-DIPYRIDAMOLE ER 25-200 MG PO CP12
1.0000 | ORAL_CAPSULE | Freq: Two times a day (BID) | ORAL | Status: DC
  Administered 2013-01-10 – 2013-01-13 (×7): 1 via ORAL
  Filled 2013-01-10 (×9): qty 1

## 2013-01-10 NOTE — Progress Notes (Signed)
  Echocardiogram 2D Echocardiogram has been performed.  Cathie Beams 01/10/2013, 12:09 PM

## 2013-01-10 NOTE — Progress Notes (Signed)
INITIAL NUTRITION ASSESSMENT  DOCUMENTATION CODES Per approved criteria  -Obesity Unspecified   INTERVENTION: 1.  General healthful diet; encourage intake as needed.  2.  Supplements; Prostat once daily for additional protein intake.   NUTRITION DIAGNOSIS: Unintended wt loss related to illness, poor appetite as evidenced by pt report.   Monitor:  1.  Food/Beverage; pt meeting >/=90% estimated needs with tolerance. 2.  Wt/wt change; monitor trends  Reason for Assessment: MST  57 y.o. female  Admitting Dx: Hemiparesis  ASSESSMENT: Pt admitted with weakness.  She was found down at home x3 days; unable to get up.  Pt has been assessed by SLP who determined pt is appropriate for Regular texture with thin liquids.  Pt to receive lunch tray this afternoon.  Being evaluated by Neuro. Pt reported wt loss on admission.  She believes she has lost ~75 lbs due to chronic illness.  She endorses preparing meals at home.  She states she eats 3 meals/day on average.  She has a good appetite.  Per chart review her wt has been consistently stable at 212 lbs for >1 year.  Elevated CK level on admission.  Nutrition Focused Physical Exam:  Subcutaneous Fat:  Orbital Region: WNL Upper Arm Region: WNL, stretch marks and loose skin noted Thoracic and Lumbar Region: WNL  Muscle:  Temple Region: WNL Clavicle Bone Region: WNL Clavicle and Acromion Bone Region: WNL Scapular Bone Region: WNL Dorsal Hand: WNL Patellar Region: WNL Anterior Thigh Region: WNL, stretch marks and loose skin noted Posterior Calf Region: WNL  Edema: none present    Height: Ht Readings from Last 1 Encounters:  01/10/13 5\' 6"  (1.676 m)    Weight: Wt Readings from Last 1 Encounters:  01/10/13 194 lb 0.1 oz (88 kg)    Ideal Body Weight: 123 lbs  % Ideal Body Weight: 157%  Wt Readings from Last 10 Encounters:  01/10/13 194 lb 0.1 oz (88 kg)  07/07/12 212 lb (96.163 kg)  07/07/12 212 lb (96.163 kg)  06/24/12  212 lb (96.163 kg)  06/07/12 212 lb (96.163 kg)  03/19/12 212 lb (96.163 kg)  03/04/12 212 lb (96.163 kg)  03/04/12 212 lb (96.163 kg)  03/03/12 212 lb (96.163 kg)  03/03/12 212 lb (96.163 kg)    Usual Body Weight: 212 lbs per chart review  % Usual Body Weight: 91%  BMI:  Body mass index is 31.33 kg/(m^2).  Estimated Nutritional Needs: Kcal: 1600-1850 Protein: 76-89g Fluid: >1.8 L/day  Skin: intact  Diet Order: Carb Control  EDUCATION NEEDS: -No education needs identified at this time   Intake/Output Summary (Last 24 hours) at 01/10/13 1218 Last data filed at 01/10/13 0616  Gross per 24 hour  Intake   1019 ml  Output      0 ml  Net   1019 ml    Last BM: 8/23  Labs:   Recent Labs Lab 01/09/13 2222 01/10/13 0942 01/10/13 1100  NA 134* 138  --   K 2.9* 3.4*  --   CL 95* 102  --   CO2 27 26  --   BUN 15 9  --   CREATININE 0.67 0.61  --   CALCIUM 8.5 8.5  --   MG  --   --  1.7  GLUCOSE 185* 173*  --     CBG (last 3)   Recent Labs  01/09/13 1656 01/10/13 0819  GLUCAP 210* 159*    Scheduled Meds: . dipyridamole-aspirin  1 capsule Oral QHS  .  enoxaparin (LOVENOX) injection  40 mg Subcutaneous Q24H  . insulin aspart  0-9 Units Subcutaneous TID WC  . insulin glargine  15 Units Subcutaneous QHS  . lisinopril  20 mg Oral Daily  . potassium chloride  10 mEq Intravenous Q1 Hr x 6  . simvastatin  40 mg Oral QPM  . [DISCONTINUED] sodium chloride   Intravenous STAT    Continuous Infusions:   Past Medical History  Diagnosis Date  . Atypical chest pain     a. non obstructive by cath in 2008 and 2010;  b. 02/2012 Myoview: non-ischemic, EF 57%  . HTN (hypertension)   . Hyperlipidemia   . Syncope and collapse     a. near-syncopal episode in November 2008;  b. 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  . CVA (cerebral infarction)     a.  Small right parietal noted incidentally 04/2007;  b. right sided embolic CVA 05/2012;  c. 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 Peripheral neuropathy   . Obesity, unspecified   . Esophageal reflux   . Diabetes mellitus, type II   . Diaphragmatic hernia without mention of obstruction or gangrene   . Irritable bowel syndrome   . Morbid obesity   . Cellulitis     a. 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 Stroke     2013-'no residual'  . Vertigo   . Dry skin   . Constipation   . Tobacco abuse     Past Surgical History  Procedure Laterality Date  . Invasive electrophysiologic study  5/09    followed by insertion of an implantable loop recorder. S/p removal   . Colonoscopy    . Cholecystectomy    . Multiple toe surgeries    . Cardiac catheterization      LAD 30%, circumflex 50%, OM 75%, RI 60% with small branch 80%, dominant RCA 60%, EF 45-50%  . I&d extremity  12/23/2011    Procedure: IRRIGATION AND DEBRIDEMENT EXTREMITY;  Surgeon: Verlee Rossetti, MD;  Location: [facility] OR;  Service: Orthopedics;  Laterality: Left;  Left Foot  . I&d extremity  12/26/2011    Procedure: IRRIGATION AND DEBRIDEMENT EXTREMITY;  Surgeon: Toni Arthurs, MD;  Location: [facility] OR;  Service: Orthopedics;  Laterality: Left;  LEFT FOOT I&D WITH POSSIBLE WOUND VAC APPLICATION, POSSIBLE LEFT FIRST RAY AMPUTATION  . Amputation  12/30/2011    Procedure: AMPUTATION RAY;  Surgeon: Toni Arthurs, MD;  Location: [facility] OR;  Service: Orthopedics;  Laterality: Left;  LEFT FIRST RAY AMPUTATION  . Eye surgery      cataract  . Amputation  01/13/2012    Procedure: AMPUTATION BELOW KNEE;  Surgeon: Toni Arthurs, MD;  Location: [facility] OR;  Service: Orthopedics;  Laterality: Left;  . Amputation  03/04/2012    Procedure: AMPUTATION BELOW KNEE;  Surgeon: Toni Arthurs, MD;  Location: [facility] OR;  Service: Orthopedics;  Laterality: Left;  Revision of Left Below Knee Amputation  . Tee without cardioversion N/A 07/07/2012    Procedure: TRANSESOPHAGEAL ECHOCARDIOGRAM (TEE);  Surgeon: Lewayne Bunting, MD;  Location: [facility] ENDOSCOPY;  Service: Cardiovascular;  Laterality: N/A;    Loyce Dys, MS RD LDN Clinical Inpatient Dietitian Pager: [phone] Weekend/After hours pager: [phone]

## 2013-01-10 NOTE — Progress Notes (Signed)
Patient seen this Am by Dr. Jomarie Longs. +MRI- on aggrenox Passed swallow eval -neuro consulted Await further work up  [employer] DO

## 2013-01-10 NOTE — Progress Notes (Signed)
VASCULAR LAB PRELIMINARY  PRELIMINARY  PRELIMINARY  PRELIMINARY  Carotid Dopplers completed.    Preliminary report:  There is 1-39% ICA stenosis.  Vertebral artery flow is antegrade.  Claudette Wermuth, RVT 01/10/2013, 11:56 AM

## 2013-01-10 NOTE — H&P (Addendum)
[facility] History and Physical  EMMALINE WAHBA [name]:[id number] DOB: Nov 23, 1955 DOA: 01/09/2013  Referring physician: EDP PCP: [name], MD   Chief Complaint: Could not get up  HPI: [name] is a 57 y.o. female with history of diabetes, hypertension, CVA, left BKA, who usually ambulates using a wheelchair or using her prosthesis occasionally. History is provided by her sister in her knees primarily and reports that she fell on Wednesday while changing the sheets on her bed, falling backwards and was unable to get out of that position until she was helped by her family who found her there on Friday. Subsequently been helped her get back into a wheelchair, she reports increased right leg weakness since Wednesday, and limited ability to transfer as well. On "Sunday she was sitting on her potty chair and unable to transfer from her potty chair to wheelchair which was also change from baseline, subsequently brought to the ER by family. Patient is very vague and poor historian but does admit to worsening right lower extremity weakness since Wednesday.   Review of Systems: The patient denies anorexia, fever, weight loss,, vision loss, decreased hearing, hoarseness, chest pain, syncope, dyspnea on exertion, peripheral edema, balance deficits, hemoptysis, abdominal pain, melena, hematochezia, severe indigestion/heartburn, hematuria, incontinence, genital sores, muscle weakness, suspicious skin lesions, transient blindness, difficulty walking, depression, unusual weight change, abnormal bleeding, enlarged lymph nodes, angioedema, and breast masses.    Past Medical History  Diagnosis Date  . Atypical chest pain     a. non obstructive by cath in 2008 and 2010;  b. 02/2012 Myoview: non-ischemic, EF 57%  . HTN (hypertension)   . Hyperlipidemia   . Syncope and collapse     a. near-syncopal episode in November 2008;  b. 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  . CVA (cerebral infarction)      a.  Small right parietal noted incidentally 04/2007;  b. right sided embolic CVA 05/2012;  c. 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 Peripheral neuropathy   . Obesity, unspecified   . Esophageal reflux   . Diabetes mellitus, type II   . Diaphragmatic hernia without mention of obstruction or gangrene   . Irritable bowel syndrome   . Morbid obesity   . Cellulitis     a. 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 Stroke     20" 13-'no residual'  . Vertigo   . Dry skin   . Constipation   . Tobacco abuse    Past Surgical History  Procedure Laterality Date  . Invasive electrophysiologic study  5/09    followed by insertion of an implantable loop recorder. S/p removal   . Colonoscopy    . Cholecystectomy    . Multiple toe surgeries    . Cardiac catheterization      LAD 30%, circumflex 50%, OM 75%, RI 60% with small branch 80%, dominant RCA 60%, EF 45-50%  . I&d extremity  12/23/2011    Procedure: IRRIGATION AND DEBRIDEMENT EXTREMITY;  Surgeon: Verlee Rossetti, MD;  Location: [facility] OR;  Service: Orthopedics;  Laterality: Left;  Left Foot  . I&d extremity  12/26/2011    Procedure: IRRIGATION AND DEBRIDEMENT EXTREMITY;  Surgeon: Toni Arthurs, MD;  Location[facility] OR;  Service: Orthopedics;  Laterality: Left;  LEFT FOOT I&D WITH POSSIBLE WOUND VAC APPLICATION, POSSIBLE LEFT FIRST RAY AMPUTATION  . Amputation  12/30/2011    Procedure:  AMPUTATION RAY;  Surgeon: Toni Arthurs, MD;  Location: [facility] OR;  Service: Orthopedics;  Laterality: Left;  LEFT FIRST RAY AMPUTATION  . Eye surgery      cataract  . Amputation  01/13/2012    Procedure: AMPUTATION BELOW KNEE;  Surgeon: Toni Arthurs, MD;  Location: [facility] OR;  Service: Orthopedics;  Laterality: Left;  . Amputation  03/04/2012    Procedure: AMPUTATION BELOW KNEE;  Surgeon: Toni Arthurs, MD;  Location: [facility] OR;  Service: Orthopedics;  Laterality: Left;  Revision of Left Below Knee  Amputation  . Tee without cardioversion N/A 07/07/2012    Procedure: TRANSESOPHAGEAL ECHOCARDIOGRAM (TEE);  Surgeon: Lewayne Bunting, MD;  Location: [facility] ENDOSCOPY;  Service: Cardiovascular;  Laterality: N/A;   Social History:  reports that she has been smoking Cigarettes.  She has a 10 pack-year smoking history. She has never used smokeless tobacco. She reports that she drinks about 2.4 ounces of alcohol per week. She reports that she does not use illicit drugs. Lives at home by herself ambulates using a wheelchair and sometimes with her prosthesis  Allergies  Allergen Reactions  . Banana Other (See Comments)    Facial swelling  . Penicillins Itching     edema  . Strawberry Other (See Comments)    Facial swelling    Family History  Problem Relation Age of Onset  . Pneumonia Mother   . Gallbladder disease Mother     cancer  . Heart failure Mother   . Diabetes Father   . Coronary artery disease Father   . Stroke Neg Hx     Prior to Admission medications   Medication Sig Start Date End Date Taking? Authorizing Provider  acetaminophen (TYLENOL) 500 MG tablet Take 1,000 mg by mouth as needed for pain. For pain   Yes Historical Provider, MD  dipyridamole-aspirin (AGGRENOX) 200-25 MG per 12 hr capsule Take 1 capsule by mouth at bedtime. 06/26/12  Yes Richarda Overlie, MD  glimepiride (AMARYL) 4 MG tablet Take 4 mg by mouth daily.    Yes Historical Provider, MD  hydrochlorothiazide (HYDRODIURIL) 25 MG tablet Take 25 mg by mouth daily.   Yes Historical Provider, MD  insulin aspart (NOVOLOG) 100 UNIT/ML injection Inject 6 Units into the skin 3 (three) times daily before meals.    Yes Historical Provider, MD  insulin glargine (LANTUS) 100 UNIT/ML injection Inject 20 Units into the skin at bedtime.   Yes Historical Provider, MD  lisinopril (PRINIVIL,ZESTRIL) 20 MG tablet Take 20 mg by mouth daily.   Yes Historical Provider, MD  metFORMIN (GLUCOPHAGE) 500 MG tablet Take 250 mg by mouth 2 (two) times  daily with a meal.   Yes Historical Provider, MD  simvastatin (ZOCOR) 40 MG tablet Take 40 mg by mouth every evening.   Yes Historical Provider, MD   Physical Exam: Filed Vitals:   01/10/13 0139  BP: 183/87  Pulse: 92  Temp: 98 F (36.7 C)  Resp: 18     General: Alert awake oriented x3 in no distress, extremely unkempt with foul odor  HEENT: PERRLA, EOMI  CVS S1-S2 regular rate rhythm  Lungs clear to condition bilaterally  Abdomen soft nontender with normal bowel sounds no organomegaly  Extremities no edema clubbing or cyanosis  Skin unkempt skin and scaly feet and nails  N neuro: Right lower extremity weakness 3/5 rest 5 at 5 out of 5 except left lower extremity with BKA  -Left leg BKA  Psychiatric flat affect    Labs on Admission:  Basic Metabolic Panel:  Recent Labs Lab 01/09/13 2222  NA 134*  K 2.9*  CL 95*  CO2 27  GLUCOSE 185*  BUN 15  CREATININE 0.67  CALCIUM 8.5   Liver Function Tests:  Recent Labs Lab 01/09/13 2222  AST 23  ALT 16  ALKPHOS 121*  BILITOT 0.4  PROT 6.1  ALBUMIN 2.7*   No results found for this basename: LIPASE, AMYLASE,  in the last 168 hours No results found for this basename: AMMONIA,  in the last 168 hours CBC:  Recent Labs Lab 01/09/13 2222  WBC 11.5*  NEUTROABS 7.9*  HGB 11.9*  HCT 35.2*  MCV 81.7  PLT 238   Cardiac Enzymes:  Recent Labs Lab 01/09/13 2222  CKTOTAL 838*    BNP (last 3 results) No results found for this basename: PROBNP,  in the last 8760 hours CBG:  Recent Labs Lab 01/09/13 1656  GLUCAP 210*    Radiological Exams on Admission: Dg Chest 2 View  01/09/2013   *RADIOLOGY REPORT*  Clinical Data: Fall.  CHEST - 2 VIEW  Comparison: 06/07/2012  Findings: Lungs are adequately inflated without consolidation or effusion.  Cardiomediastinal silhouette and remainder of the exam is unchanged.  IMPRESSION: No acute cardiopulmonary disease.   Original Report Authenticated By: Elberta Fortis,  M.D.   Ct Head Wo Contrast  01/09/2013   CLINICAL DATA:  Recent falls.  Right leg pain.  EXAM: CT HEAD WITHOUT CONTRAST  TECHNIQUE: Contiguous axial images were obtained from the base of the skull through the vertex without intravenous contrast.  COMPARISON:  Head CT 09/07/2012. MRI 06/25/2012.  FINDINGS: There is no evidence of acute intracranial hemorrhage, mass lesion, brain edema or extra-axial fluid collection. There is stable mild generalized atrophy. There is new focal low density within the anterior left thalamus (image 13). Based on the conspicuity of this finding, this is not likely to be acute. There is also new low density posteriorly in the right thalamus on image 15. No cortical infarct or hydrocephalus is identified. Intracranial vascular calcifications are noted.  The visualized paranasal sinuses, mastoid air cells and middle ears are clear. The calvarium is intact.  IMPRESSION: Interval bilateral thalamic lacunar infarcts, likely subacute. No acute intracranial findings identified.   Electronically Signed   By: Roxy Horseman   On: 01/09/2013 20:54     Assessment/Plan  1. Right leg weakness with subacute bilateral thalamic lacunar infarcts on CT head -symptoms likely started on Wednesday and hence out of TPA  -Admit to telemetry -Check MRI MRA brain -Concern for embolic CVA, although prior workup for this has been negative -Failed RN swallow screen, use aspirin 300mg   Suppository tonight -Swallow evaluation in a.m. -Check 2-D echocardiogram/carotid duplex -PT OT ST eval  2. Diabetes -hold metformin -Check HbA1c, sliding scale insulin  3. Hypertension -Continue  Lisinopril  4. mildly elevated CK -Due to fall, hydrate and monitor  5.  history of CVA -On Aggrenox, hold this pending swallow evaluation  6. Hypokalemia -Most likely from HCTZ -Replace, repeat in a.m.  DVT prophylaxis with Lovenox  Code Status: Full code Family Communication: d/w a niece and sister  bedside  Disposition Plan: inpatient  Time spent:  Sadiya Durand [facility] Pager [phone]  If 7PM-7AM, please contact night-coverage [email or web address] Password [facility] 01/10/2013, 1:53 AM

## 2013-01-10 NOTE — Consult Note (Addendum)
Referring Physician: Benjamine Mola    Chief Complaint: right leg weakness and new left putamen infarct.   HPI:                                                                                                                                         [name] is an 57 y.o. female who was seen in hospital back in 06/2012 for right hippocampal infarct as well as a right frontal lobe tiny infarct. Infarcts were felt to be embolic secondary to unknown etiology. Patient has had a loop recorder in past unrevealing for atrial fibrillation x 2 yrs. TTE and TEE were normal. MRA brain on 06/25/2012 No major vessel occlusion or correctable proximal stenosis. Diffuse intracranial atherosclerotic disease with narrowing and irregularity of the medium small vessels diffusely. 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 During that admission patient was changed from Plavix to Aggrenox by Dr. Pearlean Brownie with goal to be on Aggrenox BID within one week. Patient returns to hospital due to increased right leg weakness since 01/05/13--Patient states she was changing her sheets on her bed when she lost strength in her right leg and fell.  She states she was on the floor for 3 days until her sister came over to help her.  She denied any UE weakness but did feel her left LE was also weak at time of event.   MRI on admission shows a new acute left putamen infarct. Per cahrt review patient has been only taking Aggrenox one capsule QHS.    Date last known well: Date: 01/05/2013 Time last known well: Unable to determine tPA Given: No: out of window   Past Medical History  Diagnosis Date  . Atypical chest pain     a. non obstructive by cath in 2008 and 2010;  b. 02/2012 Myoview: non-ischemic, EF 57%  . HTN (hypertension)   . Hyperlipidemia   . Syncope and collapse     a. near-syncopal episode in November 2008;  b. 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  . CVA (cerebral infarction)     a.  Small right  parietal noted incidentally 04/2007;  b. right sided embolic CVA 05/2012;  c. 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 Peripheral neuropathy   . Obesity, unspecified   . Esophageal reflux   . Diabetes mellitus, type II   . Diaphragmatic hernia without mention of obstruction or gangrene   . Irritable bowel syndrome   . Morbid obesity   . Cellulitis     a. 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 Stroke     2013-'no residual'  . Vertigo   . Dry skin   . Constipation   . Tobacco abuse     Past Surgical History  Procedure Laterality Date  . Invasive electrophysiologic study  5/09    followed by insertion of an implantable loop recorder. S/p removal   . Colonoscopy    . Cholecystectomy    . Multiple toe surgeries    . Cardiac catheterization      LAD 30%, circumflex 50%, OM 75%, RI 60% with small branch 80%, dominant RCA 60%, EF 45-50%  . I&d extremity  12/23/2011    Procedure: IRRIGATION AND DEBRIDEMENT EXTREMITY;  Surgeon: Verlee Rossetti, MD;  Location: [facility] OR;  Service: Orthopedics;  Laterality: Left;  Left Foot  . I&d extremity  12/26/2011    Procedure: IRRIGATION AND DEBRIDEMENT EXTREMITY;  Surgeon: Toni Arthurs, MD;  Location: [facility] OR;  Service: Orthopedics;  Laterality: Left;  LEFT FOOT I&D WITH POSSIBLE WOUND VAC APPLICATION, POSSIBLE LEFT FIRST RAY AMPUTATION  . Amputation  12/30/2011    Procedure: AMPUTATION RAY;  Surgeon: Toni Arthurs, MD;  Location: [facility] OR;  Service: Orthopedics;  Laterality: Left;  LEFT FIRST RAY AMPUTATION  . Eye surgery      cataract  . Amputation  01/13/2012    Procedure: AMPUTATION BELOW KNEE;  Surgeon: Toni Arthurs, MD;  Location: [facility] OR;  Service: Orthopedics;  Laterality: Left;  . Amputation  03/04/2012    Procedure: AMPUTATION BELOW KNEE;  Surgeon: Toni Arthurs, MD;  Location: [facility] OR;  Service: Orthopedics;  Laterality: Left;  Revision of Left Below Knee Amputation  . Tee  without cardioversion N/A 07/07/2012    Procedure: TRANSESOPHAGEAL ECHOCARDIOGRAM (TEE);  Surgeon: Lewayne Bunting, MD;  Location: [facility] ENDOSCOPY;  Service: Cardiovascular;  Laterality: N/A;    Family History  Problem Relation Age of Onset  . Pneumonia Mother   . Gallbladder disease Mother     cancer  . Heart failure Mother   . Diabetes Father   . Coronary artery disease Father   . Stroke Neg Hx    Social History:  reports that she has been smoking Cigarettes.  She has a 10 pack-year smoking history. She has never used smokeless tobacco. She reports that she drinks about 2.4 ounces of alcohol per week. She reports that she does not use illicit drugs.  Allergies:  Allergies  Allergen Reactions  . Banana Other (See Comments)    Facial swelling  . Penicillins Itching     edema  . Strawberry Other (See Comments)    Facial swelling    Medications:                                                                                                                           Prior to Admission:  Prescriptions prior to admission  Medication Sig Dispense Refill  . acetaminophen (TYLENOL) 500 MG tablet Take 1,000 mg by mouth as needed for pain. For pain      . dipyridamole-aspirin (AGGRENOX) 200-25 MG per 12 hr capsule Take 1 capsule by mouth at bedtime.  60 capsule  2  . glimepiride (AMARYL) 4 MG  tablet Take 4 mg by mouth daily.       . hydrochlorothiazide (HYDRODIURIL) 25 MG tablet Take 25 mg by mouth daily.      . insulin aspart (NOVOLOG) 100 UNIT/ML injection Inject 6 Units into the skin 3 (three) times daily before meals.       . insulin glargine (LANTUS) 100 UNIT/ML injection Inject 20 Units into the skin at bedtime.      Marland Kitchen lisinopril (PRINIVIL,ZESTRIL) 20 MG tablet Take 20 mg by mouth daily.      . metFORMIN (GLUCOPHAGE) 500 MG tablet Take 250 mg by mouth 2 (two) times daily with a meal.      . simvastatin (ZOCOR) 40 MG tablet Take 40 mg by mouth every evening.       Scheduled: .  dipyridamole-aspirin  1 capsule Oral QHS  . enoxaparin (LOVENOX) injection  40 mg Subcutaneous Q24H  . insulin aspart  0-9 Units Subcutaneous TID WC  . insulin glargine  15 Units Subcutaneous QHS  . lisinopril  20 mg Oral Daily  . simvastatin  40 mg Oral QPM  . [DISCONTINUED] sodium chloride   Intravenous STAT    ROS:                                                                                                                                       History obtained from the patient  General ROS: negative for - chills, fatigue, fever, night sweats, weight gain or weight loss Psychological ROS: negative for - behavioral disorder, hallucinations, memory difficulties, mood swings or suicidal ideation Ophthalmic ROS: negative for - blurry vision, double vision, eye pain or loss of vision ENT ROS: negative for - epistaxis, nasal discharge, oral lesions, sore throat, tinnitus or vertigo Allergy and Immunology ROS: negative for - hives or itchy/watery eyes Hematological and Lymphatic ROS: negative for - bleeding problems, bruising or swollen lymph nodes Endocrine ROS: negative for - galactorrhea, hair pattern changes, polydipsia/polyuria or temperature intolerance Respiratory ROS: negative for - cough, hemoptysis, shortness of breath or wheezing Cardiovascular ROS: negative for - chest pain, dyspnea on exertion, edema or irregular heartbeat Gastrointestinal ROS: negative for - abdominal pain, diarrhea, hematemesis, nausea/vomiting or stool incontinence Genito-Urinary ROS: negative for - dysuria, hematuria, incontinence or urinary frequency/urgency Musculoskeletal ROS: negative for - joint swelling or muscular weakness Neurological ROS: as noted in HPI Dermatological ROS: negative for rash and skin lesion changes  Neurologic Examination:  Blood pressure 166/84, pulse 87, temperature 97.9 F (36.6  C), temperature source Oral, resp. rate 18, height 5\' 6"  (1.676 m), weight 88 kg (194 lb 0.1 oz), SpO2 100.00%.   Mental Status: Alert, oriented, thought content appropriate.  Speech fluent without evidence of aphasia.  Able to follow 3 step commands without difficulty. Cranial Nerves: II: Discs flat bilaterally; Visual fields grossly normal, pupils equal, round, reactive to light and accommodation III,IV, VI: ptosis not present, extra-ocular motions intact bilaterally V,VII: smile symmetric, facial light touch sensation normal bilaterally VIII: hearing normal bilaterally IX,X: gag reflex present XI: bilateral shoulder shrug XII: midline tongue extension Motor: Right : Upper extremity   5/5    Left:     Upper extremity   5/5  Lower extremity   5/5     Lower extremity   4/5 (hip flexion) --no drift in either UE or LE Tone and bulk:normal tone throughout; no atrophy noted Sensory: Pinprick and light touch intact throughout, bilaterally with decreased right stocking distribution peripheral neuropathy.  Deep Tendon Reflexes:  Right: Upper Extremity   Left: Upper extremity   biceps (C-5 to C-6) 2/4   biceps (C-5 to C-6) 2/4 tricep (C7) 2/4    triceps (C7) 2/4 Brachioradialis (C6) 2/4  Brachioradialis (C6) 2/4  Lower Extremity Lower Extremity  quadriceps (L-2 to L-4) 2/4   quadriceps (L-2 to L-4) 2/4 Achilles (S1) 2/4   Achilles (S1) amputation  Plantars: Right: downgoing   Left: amputation Cerebellar: normal finger-to-nose,   CV: pulses palpable throughout    Results for orders placed during the hospital encounter of 01/09/13 (from the past 48 hour(s))  GLUCOSE, CAPILLARY     Status: Abnormal   Collection Time    01/09/13  4:56 PM      Result Value Range   Glucose-Capillary 210 (*) 70 - 99 mg/dL  CBC WITH DIFFERENTIAL     Status: Abnormal   Collection Time    01/09/13 10:22 PM      Result Value Range   WBC 11.5 (*) 4.0 - 10.5 K/uL   RBC 4.31  3.87 - 5.11 MIL/uL    Hemoglobin 11.9 (*) 12.0 - 15.0 g/dL   HCT 16.1 (*) 09.6 - 04.5 %   MCV 81.7  78.0 - 100.0 fL   MCH 27.6  26.0 - 34.0 pg   MCHC 33.8  30.0 - 36.0 g/dL   RDW 40.9  81.1 - 91.4 %   Platelets 238  150 - 400 K/uL   Neutrophils Relative % 69  43 - 77 %   Neutro Abs 7.9 (*) 1.7 - 7.7 K/uL   Lymphocytes Relative 26  12 - 46 %   Lymphs Abs 3.0  0.7 - 4.0 K/uL   Monocytes Relative 5  3 - 12 %   Monocytes Absolute 0.6  0.1 - 1.0 K/uL   Eosinophils Relative 0  0 - 5 %   Eosinophils Absolute 0.1  0.0 - 0.7 K/uL   Basophils Relative 0  0 - 1 %   Basophils Absolute 0.0  0.0 - 0.1 K/uL  COMPREHENSIVE METABOLIC PANEL     Status: Abnormal   Collection Time    01/09/13 10:22 PM      Result Value Range   Sodium 134 (*) 135 - 145 mEq/L   Potassium 2.9 (*) 3.5 - 5.1 mEq/L   Chloride 95 (*) 96 - 112 mEq/L   CO2 27  19 - 32 mEq/L   Glucose, Bld 185 (*) 70 -  99 mg/dL   BUN 15  6 - 23 mg/dL   Creatinine, Ser 4.54  0.50 - 1.10 mg/dL   Calcium 8.5  8.4 - 09.8 mg/dL   Total Protein 6.1  6.0 - 8.3 g/dL   Albumin 2.7 (*) 3.5 - 5.2 g/dL   AST 23  0 - 37 U/L   ALT 16  0 - 35 U/L   Alkaline Phosphatase 121 (*) 39 - 117 U/L   Total Bilirubin 0.4  0.3 - 1.2 mg/dL   GFR calc non Af Amer >90  >90 mL/min   GFR calc Af Amer >90  >90 mL/min   Comment: (NOTE)     The eGFR has been calculated using the CKD EPI equation.     This calculation has not been validated in all clinical situations.     eGFR's persistently <90 mL/min signify possible Chronic Kidney     Disease.  CK     Status: Abnormal   Collection Time    01/09/13 10:22 PM      Result Value Range   Total CK 838 (*) 7 - 177 U/L  POCT I-STAT TROPONIN I     Status: None   Collection Time    01/09/13 10:27 PM      Result Value Range   Troponin i, poc 0.00  0.00 - 0.08 ng/mL   Comment 3            Comment: Due to the release kinetics of cTnI,     a negative result within the first hours     of the onset of symptoms does not rule out     myocardial  infarction with certainty.     If myocardial infarction is still suspected,     repeat the test at appropriate intervals.  CG4 I-STAT (LACTIC ACID)     Status: None   Collection Time    01/09/13 10:30 PM      Result Value Range   Lactic Acid, Venous 1.39  0.5 - 2.2 mmol/L  GLUCOSE, CAPILLARY     Status: Abnormal   Collection Time    01/10/13  8:19 AM      Result Value Range   Glucose-Capillary 159 (*) 70 - 99 mg/dL   Comment 1 Notify RN    CBC     Status: None   Collection Time    01/10/13  9:42 AM      Result Value Range   WBC 9.7  4.0 - 10.5 K/uL   RBC 4.46  3.87 - 5.11 MIL/uL   Hemoglobin 12.3  12.0 - 15.0 g/dL   HCT 11.9  14.7 - 82.9 %   MCV 81.8  78.0 - 100.0 fL   MCH 27.6  26.0 - 34.0 pg   MCHC 33.7  30.0 - 36.0 g/dL   RDW 56.2  13.0 - 86.5 %   Platelets 217  150 - 400 K/uL  BASIC METABOLIC PANEL     Status: Abnormal   Collection Time    01/10/13  9:42 AM      Result Value Range   Sodium 138  135 - 145 mEq/L   Potassium 3.4 (*) 3.5 - 5.1 mEq/L   Chloride 102  96 - 112 mEq/L   CO2 26  19 - 32 mEq/L   Glucose, Bld 173 (*) 70 - 99 mg/dL   BUN 9  6 - 23 mg/dL   Creatinine, Ser 7.84  0.50 - 1.10 mg/dL   Calcium 8.5  8.4 - 10.5 mg/dL   GFR calc non Af Amer >90  >90 mL/min   GFR calc Af Amer >90  >90 mL/min   Comment: (NOTE)     The eGFR has been calculated using the CKD EPI equation.     This calculation has not been validated in all clinical situations.     eGFR's persistently <90 mL/min signify possible Chronic Kidney     Disease.  LIPID PANEL     Status: Abnormal   Collection Time    01/10/13  9:42 AM      Result Value Range   Cholesterol 202 (*) 0 - 200 mg/dL   Triglycerides 161  <096 mg/dL   HDL 42  >04 mg/dL   Total CHOL/HDL Ratio 4.8     VLDL 28  0 - 40 mg/dL   LDL Cholesterol 540 (*) 0 - 99 mg/dL   Comment:            Total Cholesterol/HDL:CHD Risk     Coronary Heart Disease Risk Table                         Men   Women      1/2 Average Risk   3.4    3.3      Average Risk       5.0   4.4      2 X Average Risk   9.6   7.1      3 X Average Risk  23.4   11.0                Use the calculated Patient Ratio     above and the CHD Risk Table     to determine the patient's CHD Risk.                ATP III CLASSIFICATION (LDL):      <100     mg/dL   Optimal      981-191  mg/dL   Near or Above                        Optimal      130-159  mg/dL   Borderline      478-295  mg/dL   High      >621     mg/dL   Very High  MAGNESIUM     Status: None   Collection Time    01/10/13 11:00 AM      Result Value Range   Magnesium 1.7  1.5 - 2.5 mg/dL   Dg Chest 2 View  07/24/6576   *RADIOLOGY REPORT*  Clinical Data: Weakness  CHEST - 2 VIEW  Comparison: 01/09/2013  Findings: The cardiac shadow is stable.  The inspiratory effort is poor although no focal infiltrate is seen.  No acute bony abnormality is noted.  IMPRESSION: No acute abnormalities seen.   Original Report Authenticated By: Alcide Clever, M.D.   Dg Chest 2 View  01/09/2013   *RADIOLOGY REPORT*  Clinical Data: Fall.  CHEST - 2 VIEW  Comparison: 06/07/2012  Findings: Lungs are adequately inflated without consolidation or effusion.  Cardiomediastinal silhouette and remainder of the exam is unchanged.  IMPRESSION: No acute cardiopulmonary disease.   Original Report Authenticated By: Elberta Fortis, M.D.   Ct Head Wo Contrast  01/09/2013   CLINICAL DATA:  Recent falls.  Right leg pain.  EXAM: CT HEAD WITHOUT CONTRAST  TECHNIQUE:  Contiguous axial images were obtained from the base of the skull through the vertex without intravenous contrast.  COMPARISON:  Head CT 09/07/2012. MRI 06/25/2012.  FINDINGS: There is no evidence of acute intracranial hemorrhage, mass lesion, brain edema or extra-axial fluid collection. There is stable mild generalized atrophy. There is new focal low density within the anterior left thalamus (image 13). Based on the conspicuity of this finding, this is not likely to be acute.  There is also new low density posteriorly in the right thalamus on image 15. No cortical infarct or hydrocephalus is identified. Intracranial vascular calcifications are noted.  The visualized paranasal sinuses, mastoid air cells and middle ears are clear. The calvarium is intact.  IMPRESSION: Interval bilateral thalamic lacunar infarcts, likely subacute. No acute intracranial findings identified.   Electronically Signed   By: Roxy Horseman   On: 01/09/2013 20:54   Mr Brain Wo Contrast  01/10/2013   *RADIOLOGY REPORT*  Clinical Data:  Stroke.  Right leg weakness.  Abnormal CT scan of the head.  MRI HEAD WITHOUT CONTRAST MRA HEAD WITHOUT CONTRAST  Technique:  Multiplanar, multiecho pulse sequences of the brain and surrounding structures were obtained without intravenous contrast. Angiographic images of the head were obtained using MRA technique without contrast.  Comparison:  CT head without contrast 01/09/2013.  MRI brain and MRA head 06/25/2012.  MRI HEAD  Findings:  A 12 mm acute non hemorrhagic infarct is present within the left putamen.  The CT abnormalities correspond with chronic lacunar infarcts involving the left internal capsule and right thalamus.  These are new since the 04/22/2/14 CT, but not acute. Additional remote lacunar infarcts are present within the basal ganglia bilaterally.  A remote lacunar infarct in the left cerebellum is again noted.  Moderate periventricular white matter changes are otherwise stable.  There is T2 signal associated with the acute infarct.  The ventricles are of normal size.  Flow is present in the major intracranial arteries.  The patient is status post bilateral lens extractions.  The paranasal sinuses and mastoid air cells are clear.  IMPRESSION:  1.  Acute non hemorrhagic infarct involving the left putamen. 2.  Additional lacunar infarcts within the basal ganglia identified on the CT scan are not acute, but are new since April 2014. 3.  Remote lacunar infarcts are  present in the basal ganglia bilaterally as well as the left cerebellum. 4.  Age advanced periventricular white matter changes are consistent with small vessel disease as well.  MRA HEAD  Findings: Mild atherosclerotic changes are present within the cavernous carotid arteries bilaterally.  There is marked tortuosity of the high right cervical ICA without stenosis.  The A1 and M1 segments demonstrate mild irregularity.  The anterior communicating artery is patent.  There is marked attenuation of distal MCA and ACA branch vessels bilaterally without significant proximal stenosis or occlusion.  The left vertebral artery is dominant.  There is marked attenuation of the distal right vertebral artery.  The basilar artery demonstrates a moderate proximal stenosis. The left vertebral artery is of fetal type.  There is a high-grade stenosis of the distal aspect of the right P1 segment. A prominent right posterior communicating artery is present.  Marked distal small vessel attenuation is evident.  IMPRESSION:  1.  Advanced small vessel disease. 2.  Moderate proximal basilar artery stenosis. 3.  High-grade stenosis of the right P1 segment.  A right posterior communicating artery is present. 4.  Atherosclerotic changes of the cavernous carotid arteries bilaterally without  significant stenoses. 5.  Marked tortuosity of the high cervical right ICA without stenosis.  This is most commonly seen in setting of chronic hypertension.   Original Report Authenticated By: Marin Roberts, M.D.   Mr Mra Head/brain Wo Cm  01/10/2013   *RADIOLOGY REPORT*  Clinical Data:  Stroke.  Right leg weakness.  Abnormal CT scan of the head.  MRI HEAD WITHOUT CONTRAST MRA HEAD WITHOUT CONTRAST  Technique:  Multiplanar, multiecho pulse sequences of the brain and surrounding structures were obtained without intravenous contrast. Angiographic images of the head were obtained using MRA technique without contrast.  Comparison:  CT head without  contrast 01/09/2013.  MRI brain and MRA head 06/25/2012.  MRI HEAD  Findings:  A 12 mm acute non hemorrhagic infarct is present within the left putamen.  The CT abnormalities correspond with chronic lacunar infarcts involving the left internal capsule and right thalamus.  These are new since the 04/22/2/14 CT, but not acute. Additional remote lacunar infarcts are present within the basal ganglia bilaterally.  A remote lacunar infarct in the left cerebellum is again noted.  Moderate periventricular white matter changes are otherwise stable.  There is T2 signal associated with the acute infarct.  The ventricles are of normal size.  Flow is present in the major intracranial arteries.  The patient is status post bilateral lens extractions.  The paranasal sinuses and mastoid air cells are clear.  IMPRESSION:  1.  Acute non hemorrhagic infarct involving the left putamen. 2.  Additional lacunar infarcts within the basal ganglia identified on the CT scan are not acute, but are new since April 2014. 3.  Remote lacunar infarcts are present in the basal ganglia bilaterally as well as the left cerebellum. 4.  Age advanced periventricular white matter changes are consistent with small vessel disease as well.  MRA HEAD  Findings: Mild atherosclerotic changes are present within the cavernous carotid arteries bilaterally.  There is marked tortuosity of the high right cervical ICA without stenosis.  The A1 and M1 segments demonstrate mild irregularity.  The anterior communicating artery is patent.  There is marked attenuation of distal MCA and ACA branch vessels bilaterally without significant proximal stenosis or occlusion.  The left vertebral artery is dominant.  There is marked attenuation of the distal right vertebral artery.  The basilar artery demonstrates a moderate proximal stenosis. The left vertebral artery is of fetal type.  There is a high-grade stenosis of the distal aspect of the right P1 segment. A prominent right  posterior communicating artery is present.  Marked distal small vessel attenuation is evident.  IMPRESSION:  1.  Advanced small vessel disease. 2.  Moderate proximal basilar artery stenosis. 3.  High-grade stenosis of the right P1 segment.  A right posterior communicating artery is present. 4.  Atherosclerotic changes of the cavernous carotid arteries bilaterally without significant stenoses. 5.  Marked tortuosity of the high cervical right ICA without stenosis.  This is most commonly seen in setting of chronic hypertension.   Original Report Authenticated By: Marin Roberts, M.D.     Assessment: 57 y.o. female with new acute left putamen infarct of small vessel distribution in patient with known risk factors (current LDL 138). Patient has been on ASA and Plavix in the past.  Currently on Aggrenox one tab QHS at night prior to hospitalization.   Stroke Risk Factors - diabetes mellitus, hyperlipidemia, hypertension and smoking  Plan:  1. Echocardiogram--pending 2. Carotid dopplers--pending 3. Prophylactic therapy-Aggrenox BID 4. Risk factor modification 5. Telemetry  monitoring 6. Frequent neuro checks 7. PT/OT SLP  Felicie Morn PA-C [facility] [phone]  01/10/2013, 11:48 AM  I personally participate in this patient's evaluation and management including formulating the above clinical assessment and management recommendations.  Venetia Maxon M.D. [facility] [phone]

## 2013-01-10 NOTE — Evaluation (Signed)
Clinical/Bedside Swallow Evaluation Patient Details  Name: [name] MRN: [id number] Date of Birth: [birth year]  Today's Date: 01/10/2013 Time: 4098-1191 SLP Time Calculation (min): 13 min  Past Medical History:  Past Medical History  Diagnosis Date  . Atypical chest pain     a. non obstructive by cath in 2008 and 2010;  b. 02/2012 Myoview: non-ischemic, EF 57%  . HTN (hypertension)   . Hyperlipidemia   . Syncope and collapse     a. near-syncopal episode in November 2008;  b. 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  . CVA (cerebral infarction)     a.  Small right parietal noted incidentally 04/2007;  b. right sided embolic CVA 05/2012;  c. 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 Peripheral neuropathy   . Obesity, unspecified   . Esophageal reflux   . Diabetes mellitus, type II   . Diaphragmatic hernia without mention of obstruction or gangrene   . Irritable bowel syndrome   . Morbid obesity   . Cellulitis     a. 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 Stroke     2013-'no residual'  . Vertigo   . Dry skin   . Constipation   . Tobacco abuse    Past Surgical History:  Past Surgical History  Procedure Laterality Date  . Invasive electrophysiologic study  5/09    followed by insertion of an implantable loop recorder. S/p removal   . Colonoscopy    . Cholecystectomy    . Multiple toe surgeries    . Cardiac catheterization      LAD 30%, circumflex 50%, OM 75%, RI 60% with small branch 80%, dominant RCA 60%, EF 45-50%  . I&d extremity  12/23/2011    Procedure: IRRIGATION AND DEBRIDEMENT EXTREMITY;  Surgeon: Verlee Rossetti, MD;  Location: [facility] OR;  Service: Orthopedics;  Laterality: Left;  Left Foot  . I&d extremity  12/26/2011    Procedure: IRRIGATION AND DEBRIDEMENT EXTREMITY;  Surgeon: Toni Arthurs, MD;  Location: [facility] OR;  Service: Orthopedics;  Laterality: Left;  LEFT FOOT I&D WITH  POSSIBLE WOUND VAC APPLICATION, POSSIBLE LEFT FIRST RAY AMPUTATION  . Amputation  12/30/2011    Procedure: AMPUTATION RAY;  Surgeon: Toni Arthurs, MD;  Location: [facility] OR;  Service: Orthopedics;  Laterality: Left;  LEFT FIRST RAY AMPUTATION  . Eye surgery      cataract  . Amputation  01/13/2012    Procedure: AMPUTATION BELOW KNEE;  Surgeon: Toni Arthurs, MD;  Location: [facility] OR;  Service: Orthopedics;  Laterality: Left;  . Amputation  03/04/2012    Procedure: AMPUTATION BELOW KNEE;  Surgeon: Toni Arthurs, MD;  Location: [facility] OR;  Service: Orthopedics;  Laterality: Left;  Revision of Left Below Knee Amputation  . Tee without cardioversion N/A 07/07/2012    Procedure: TRANSESOPHAGEAL ECHOCARDIOGRAM (TEE);  Surgeon: Lewayne Bunting, MD;  Location: [facility] ENDOSCOPY;  Service: Cardiovascular;  Laterality: N/A;   HPI:  57 y.o. female with history of diabetes, hypertension, CVA, GERD, HTN, morbid obestiy, left BKA, who usually ambulates using a wheelchair or using her prosthesis occasionally.  Pt. fell on Wednesday while changing the sheets on her bed, falling backwards and was unable to get out of that position until she was helped by her family who found her there on Friday.  On Sunday she was unable to transfer from her potty chair to wheelchair which was also  change from baseline, subsequently brought to the ER by family.  CT reveals Interval bilateral thalamic lacunar infarcts, likely subacute. No acute intracranial findings identified.  MRI results are pending.  No acute disease on CXR.   Assessment / Plan / Recommendation Clinical Impression  Pt. denied current/recent difficulty swallowing.  No indications of aspiration (cup or straw) and oral prep, mastication and transit were WFL.  CXR clear.  No  ST needed at this time.  Recommend regular texture diet and thin liquids.      Aspiration Risk  Mild    Diet Recommendation Regular;Thin liquid   Liquid Administration via: Cup;Straw Medication Administration:  Whole meds with liquid Supervision: Patient able to self feed Compensations: Slow rate;Small sips/bites Postural Changes and/or Swallow Maneuvers: Seated upright 90 degrees    Other  Recommendations Oral Care Recommendations: Oral care BID   Follow Up Recommendations  None    Frequency and Duration        Pertinent Vitals/Pain none         Swallow Study         Oral/Motor/Sensory Function Overall Oral Motor/Sensory Function: Appears within functional limits for tasks assessed   Ice Chips Ice chips: Not tested   Thin Liquid Thin Liquid: Within functional limits Presentation: Cup;Straw    Nectar Thick Nectar Thick Liquid: Not tested   Honey Thick Honey Thick Liquid: Not tested   Puree Puree: Within functional limits   Solid   GO    Solid: Within functional limits       Royce Macadamia M.Ed [employer] [phone]  01/10/2013

## 2013-01-11 DIAGNOSIS — E1142 Type 2 diabetes mellitus with diabetic polyneuropathy: Secondary | ICD-10-CM

## 2013-01-11 DIAGNOSIS — E1149 Type 2 diabetes mellitus with other diabetic neurological complication: Secondary | ICD-10-CM

## 2013-01-11 DIAGNOSIS — E876 Hypokalemia: Secondary | ICD-10-CM

## 2013-01-11 LAB — GLUCOSE, CAPILLARY
Glucose-Capillary: 128 mg/dL — ABNORMAL HIGH (ref 70–99)
Glucose-Capillary: 173 mg/dL — ABNORMAL HIGH (ref 70–99)
Glucose-Capillary: 169 mg/dL — ABNORMAL HIGH (ref 70–99)

## 2013-01-11 MED ORDER — PHENOL 1.4 % MT LIQD
1.0000 | OROMUCOSAL | Status: DC | PRN
  Administered 2013-01-11: 1 via OROMUCOSAL
  Filled 2013-01-11: qty 177

## 2013-01-11 NOTE — Evaluation (Signed)
Speech Language Pathology Evaluation Patient Details Name: [name] MRN: [id number] DOB: [birth year] Today's Date: 01/11/2013 Time: 1115-1140 SLP Time Calculation (min): 25 min  Problem List:  Patient Active Problem List   Diagnosis Date Noted  . Hemiparesis 01/10/2013  . Tobacco abuse   . Precordial pain 06/07/2012  . Chest pain at rest 02/09/2012  . Unilateral complete BKA 01/16/2012  . Chest pain 01/07/2012  . Dizziness 01/07/2012  . Hypoglycemia associated with diabetes 12/28/2011  . Diabetic ulcer of left foot 12/24/2011  . Cellulitis of left lower extremity 12/23/2011  . Paresthesia of right arm and leg 12/17/2011  . Candiduria 12/16/2011  . Facial droop 12/15/2011  . Hyperglycemia 12/15/2011  . Mild dehydration 12/15/2011  . Numbness 12/15/2011  . TIA (transient ischemic attack) 12/15/2011  . Syncope 07/02/2011  . Hypokalemia 07/02/2011  . HTN (hypertension) 07/02/2011  . UTI (lower urinary tract infection) 07/02/2011  . H/O 07/02/2011  . Normocytic anemia 07/02/2011  . PERIPHERAL NEUROPATHY 04/02/2009  . HYPERTENSION 04/02/2009  . CAD, NATIVE VESSEL 04/02/2009  . HIATAL HERNIA 04/02/2009  . Irritable bowel syndrome 04/02/2009  . HYPERLIPIDEMIA 05/30/2008  . OBESITY 05/30/2008  . LEFT VENTRICULAR FUNCTION, DECREASED 05/30/2008  . CVA (cerebral infarction) 05/30/2008  . GERD 05/30/2008  . SYNCOPE 05/30/2008  . DM type 2, uncontrolled, with neuropathy 05/30/2008   Past Medical History:  Past Medical History  Diagnosis Date  . Atypical chest pain     a. non obstructive by cath in 2008 and 2010;  b. 02/2012 Myoview: non-ischemic, EF 57%  . HTN (hypertension)   . Hyperlipidemia   . Syncope and collapse     a. near-syncopal episode in November 2008;  b. 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  . CVA (cerebral infarction)     a.  Small right parietal noted incidentally 04/2007;  b. right sided embolic CVA 05/2012;  c. 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 Peripheral neuropathy   . Obesity, unspecified   . Esophageal reflux   . Diabetes mellitus, type II   . Diaphragmatic hernia without mention of obstruction or gangrene   . Irritable bowel syndrome   . Morbid obesity   . Cellulitis     a. 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 Stroke     2013-'no residual'  . Vertigo   . Dry skin   . Constipation   . Tobacco abuse    Past Surgical History:  Past Surgical History  Procedure Laterality Date  . Invasive electrophysiologic study  5/09    followed by insertion of an implantable loop recorder. S/p removal   . Colonoscopy    . Cholecystectomy    . Multiple toe surgeries    . Cardiac catheterization      LAD 30%, circumflex 50%, OM 75%, RI 60% with small branch 80%, dominant RCA 60%, EF 45-50%  . I&d extremity  12/23/2011    Procedure: IRRIGATION AND DEBRIDEMENT EXTREMITY;  Surgeon: Verlee Rossetti, MD;  Location: [facility] OR;  Service: Orthopedics;  Laterality: Left;  Left Foot  . I&d extremity  12/26/2011    Procedure: IRRIGATION AND DEBRIDEMENT EXTREMITY;  Surgeon: Toni Arthurs, MD;  Location: [facility] OR;  Service: Orthopedics;  Laterality: Left;  LEFT FOOT I&D WITH POSSIBLE WOUND VAC APPLICATION, POSSIBLE LEFT FIRST RAY AMPUTATION  . Amputation  12/30/2011    Procedure: AMPUTATION RAY;  Surgeon:  Toni Arthurs, MD;  Location: [facility] OR;  Service: Orthopedics;  Laterality: Left;  LEFT FIRST RAY AMPUTATION  . Eye surgery      cataract  . Amputation  01/13/2012    Procedure: AMPUTATION BELOW KNEE;  Surgeon: Toni Arthurs, MD;  Location: [facility] OR;  Service: Orthopedics;  Laterality: Left;  . Amputation  03/04/2012    Procedure: AMPUTATION BELOW KNEE;  Surgeon: Toni Arthurs, MD;  Location: [facility] OR;  Service: Orthopedics;  Laterality: Left;  Revision of Left Below Knee Amputation  . Tee without cardioversion N/A 07/07/2012    Procedure: TRANSESOPHAGEAL ECHOCARDIOGRAM (TEE);   Surgeon: Lewayne Bunting, MD;  Location: [facility] ENDOSCOPY;  Service: Cardiovascular;  Laterality: N/A;   HPI:  57 y.o. female with history of diabetes, hypertension, CVA, GERD, HTN, morbid obestiy, left BKA, who usually ambulates using a wheelchair or using her prosthesis occasionally.  Pt. fell on Wednesday while changing the sheets on her bed, falling backwards and was unable to get out of that position until she was helped by her family who found her there on Friday.  On Sunday she was unable to transfer from her potty chair to wheelchair which was also change from baseline, subsequently brought to the ER by family.  CT reveals Interval bilateral thalamic lacunar infarcts, likely subacute. No acute intracranial findings identified.  MRI results are pending.  No acute disease on CXR.   Assessment / Plan / Recommendation Clinical Impression  Assessment completed with sister present.  Pt. denied any cognitive deficits, however sister stated that pt. has indeed had difficulty with memory and has missed appointments.  Insulin ran out and pt. did not pursue obtaining more.  Sister states she feels pt. forgets to take her meds sometimes.  Verbally, pt. able to accurately respond to questions, however it is evident that she has experienced cognitive impairments directly related to current medical status.  Pt. will require assistance from family (maybe 24 hour initially) therefore uncertain of her disposition.  SLP introduced at length strategies to assit with memory, organization and safety.  Mild dysarthria with 98% intelligibility in a quiet setting.  Introduced speech strategies to utilize in noisier environments.  She appears functional in the acute care venue but SLP recommends SLP for cognition after d/c      SLP Assessment  All further Speech Lanaguage Pathology  needs can be addressed in the next venue of care    Follow Up Recommendations   (next venue)    Frequency and Duration        Pertinent  Vitals/Pain         SLP Evaluation Prior Functioning  Cognitive/Linguistic Baseline: Baseline deficits Baseline deficit details:  (memory per sister) Type of Home: House  Lives With: Alone Available Help at Discharge: Family;Available PRN/intermittently Vocation: Retired   IT consultant  Overall Cognitive Status: History of cognitive impairments - at baseline Arousal/Alertness: Awake/alert Orientation Level: Oriented X4 Attention: Sustained Sustained Attention: Appears intact Memory: Impaired Memory Impairment: Prospective memory;Decreased short term memory;Decreased recall of new information;Retrieval deficit Decreased Short Term Memory: Verbal basic;Functional basic Awareness: Impaired Awareness Impairment: Anticipatory impairment;Emergent impairment;Intellectual impairment Problem Solving: Appears intact (for verbal ) Safety/Judgment: Impaired    Comprehension  Auditory Comprehension Overall Auditory Comprehension: Appears within functional limits for tasks assessed Visual Recognition/Discrimination Discrimination: Not tested Reading Comprehension Reading Status: Not tested    Expression Expression Primary Mode of Expression: Verbal Verbal Expression Overall Verbal Expression: Appears within functional limits for tasks assessed Level of Generative/Spontaneous Verbalization: Conversation Naming: Not tested  Pragmatics: No impairment Written Expression Dominant Hand: Right Written Expression: Not tested   Oral / Motor Oral Motor/Sensory Function Overall Oral Motor/Sensory Function: Impaired Labial ROM: Reduced right Labial Symmetry: Abnormal symmetry right Labial Strength: Reduced Labial Sensation: Within Functional Limits Lingual ROM: Within Functional Limits Lingual Symmetry: Within Functional Limits Motor Speech Overall Motor Speech: Impaired Respiration: Within functional limits Phonation: Normal Resonance: Within functional limits Articulation: Within  functional limitis Intelligibility: Intelligibility reduced Word: 75-100% accurate Phrase: 75-100% accurate Sentence: 75-100% accurate Conversation: 75-100% accurate Motor Planning: Witnin functional limits   GO     Breck Coons [employer].Ed [employer] [phone]  01/11/2013

## 2013-01-11 NOTE — Progress Notes (Signed)
[facility] PROGRESS NOTE  [name]:[id number] DOB: January 12, 1956 DOA: 01/09/2013 PCP: [name], MD  Assessment/Plan: CVA: Echocardiogram--Left ventricle: The cavity size was normal. Wall thickness was increased in a pattern of mild LVH. Systolic function was normal. The estimated ejection fraction was in the range of 55% to 60%. Doppler parameters are consistent with abnormal left ventricular relaxation (grade 1 diastolic dysfunction)  Carotid dopplers--There is 1-39% ICA stenosis. Vertebral artery flow is antegrade  Aggrenox BID   PT/OT   Hypokalemia- replete, recheck in AM  DM- SSI ,lantus  HTN  Code Status: full Family Communication: patient/sister Disposition Plan: per PT eval   Consultants:  neuro  Procedures:  echo  Antibiotics:    HPI/Subjective: Feeling better Wants to go home- lives alone  Objective: Filed Vitals:   01/11/13 0551  BP: 137/59  Pulse: 82  Temp: 98.2 F (36.8 C)  Resp: 24    Intake/Output Summary (Last 24 hours) at 01/11/13 1025 Last data filed at 01/10/13 1845  Gross per 24 hour  Intake   1080 ml  Output      0 ml  Net   1080 ml   Filed Weights   01/10/13 0139  Weight: 88 kg (194 lb 0.1 oz)    Exam:   General:  A+Ox3, NAD  Cardiovascular: rrr  Respiratory: clear  Abdomen: +BS, soft  Musculoskeletal: left BKA, otherwise strength good  Data Reviewed: Basic Metabolic Panel:  Recent Labs Lab 01/09/13 2222 01/10/13 0942 01/10/13 1100  NA 134* 138  --   K 2.9* 3.4*  --   CL 95* 102  --   CO2 27 26  --   GLUCOSE 185* 173*  --   BUN 15 9  --   CREATININE 0.67 0.61  --   CALCIUM 8.5 8.5  --   MG  --   --  1.7   Liver Function Tests:  Recent Labs Lab 01/09/13 2222  AST 23  ALT 16  ALKPHOS 121*  BILITOT 0.4  PROT 6.1  ALBUMIN 2.7*   No results found for this basename: LIPASE, AMYLASE,  in the last 168 hours No results found for this basename: AMMONIA,  in the last 168  hours CBC:  Recent Labs Lab 01/09/13 2222 01/10/13 0942  WBC 11.5* 9.7  NEUTROABS 7.9*  --   HGB 11.9* 12.3  HCT 35.2* 36.5  MCV 81.7 81.8  PLT 238 217   Cardiac Enzymes:  Recent Labs Lab 01/09/13 2222  CKTOTAL 838*   BNP (last 3 results) No results found for this basename: PROBNP,  in the last 8760 hours CBG:  Recent Labs Lab 01/10/13 0819 01/10/13 1331 01/10/13 1724 01/10/13 2133 01/11/13 0801  GLUCAP 159* 139* 152* 164* 128*    No results found for this or any previous visit (from the past 240 hour(s)).   Studies: Dg Chest 2 View  01/10/2013   *RADIOLOGY REPORT*  Clinical Data: Weakness  CHEST - 2 VIEW  Comparison: 01/09/2013  Findings: The cardiac shadow is stable.  The inspiratory effort is poor although no focal infiltrate is seen.  No acute bony abnormality is noted.  IMPRESSION: No acute abnormalities seen.   Original Report Authenticated By: Alcide Clever, M.D.   Dg Chest 2 View  01/09/2013   *RADIOLOGY REPORT*  Clinical Data: Fall.  CHEST - 2 VIEW  Comparison: 06/07/2012  Findings: Lungs are adequately inflated without consolidation or effusion.  Cardiomediastinal silhouette and remainder of the exam is unchanged.  IMPRESSION: No acute  cardiopulmonary disease.   Original Report Authenticated By: Elberta Fortis, M.D.   Ct Head Wo Contrast  01/09/2013   CLINICAL DATA:  Recent falls.  Right leg pain.  EXAM: CT HEAD WITHOUT CONTRAST  TECHNIQUE: Contiguous axial images were obtained from the base of the skull through the vertex without intravenous contrast.  COMPARISON:  Head CT 09/07/2012. MRI 06/25/2012.  FINDINGS: There is no evidence of acute intracranial hemorrhage, mass lesion, brain edema or extra-axial fluid collection. There is stable mild generalized atrophy. There is new focal low density within the anterior left thalamus (image 13). Based on the conspicuity of this finding, this is not likely to be acute. There is also new low density posteriorly in the right  thalamus on image 15. No cortical infarct or hydrocephalus is identified. Intracranial vascular calcifications are noted.  The visualized paranasal sinuses, mastoid air cells and middle ears are clear. The calvarium is intact.  IMPRESSION: Interval bilateral thalamic lacunar infarcts, likely subacute. No acute intracranial findings identified.   Electronically Signed   By: Roxy Horseman   On: 01/09/2013 20:54   Mr Brain Wo Contrast  01/10/2013   *RADIOLOGY REPORT*  Clinical Data:  Stroke.  Right leg weakness.  Abnormal CT scan of the head.  MRI HEAD WITHOUT CONTRAST MRA HEAD WITHOUT CONTRAST  Technique:  Multiplanar, multiecho pulse sequences of the brain and surrounding structures were obtained without intravenous contrast. Angiographic images of the head were obtained using MRA technique without contrast.  Comparison:  CT head without contrast 01/09/2013.  MRI brain and MRA head 06/25/2012.  MRI HEAD  Findings:  A 12 mm acute non hemorrhagic infarct is present within the left putamen.  The CT abnormalities correspond with chronic lacunar infarcts involving the left internal capsule and right thalamus.  These are new since the 04/22/2/14 CT, but not acute. Additional remote lacunar infarcts are present within the basal ganglia bilaterally.  A remote lacunar infarct in the left cerebellum is again noted.  Moderate periventricular white matter changes are otherwise stable.  There is T2 signal associated with the acute infarct.  The ventricles are of normal size.  Flow is present in the major intracranial arteries.  The patient is status post bilateral lens extractions.  The paranasal sinuses and mastoid air cells are clear.  IMPRESSION:  1.  Acute non hemorrhagic infarct involving the left putamen. 2.  Additional lacunar infarcts within the basal ganglia identified on the CT scan are not acute, but are new since April 2014. 3.  Remote lacunar infarcts are present in the basal ganglia bilaterally as well as the left  cerebellum. 4.  Age advanced periventricular white matter changes are consistent with small vessel disease as well.  MRA HEAD  Findings: Mild atherosclerotic changes are present within the cavernous carotid arteries bilaterally.  There is marked tortuosity of the high right cervical ICA without stenosis.  The A1 and M1 segments demonstrate mild irregularity.  The anterior communicating artery is patent.  There is marked attenuation of distal MCA and ACA branch vessels bilaterally without significant proximal stenosis or occlusion.  The left vertebral artery is dominant.  There is marked attenuation of the distal right vertebral artery.  The basilar artery demonstrates a moderate proximal stenosis. The left vertebral artery is of fetal type.  There is a high-grade stenosis of the distal aspect of the right P1 segment. A prominent right posterior communicating artery is present.  Marked distal small vessel attenuation is evident.  IMPRESSION:  1.  Advanced  small vessel disease. 2.  Moderate proximal basilar artery stenosis. 3.  High-grade stenosis of the right P1 segment.  A right posterior communicating artery is present. 4.  Atherosclerotic changes of the cavernous carotid arteries bilaterally without significant stenoses. 5.  Marked tortuosity of the high cervical right ICA without stenosis.  This is most commonly seen in setting of chronic hypertension.   Original Report Authenticated By: Marin Roberts, M.D.   Mr Mra Head/brain Wo Cm  01/10/2013   *RADIOLOGY REPORT*  Clinical Data:  Stroke.  Right leg weakness.  Abnormal CT scan of the head.  MRI HEAD WITHOUT CONTRAST MRA HEAD WITHOUT CONTRAST  Technique:  Multiplanar, multiecho pulse sequences of the brain and surrounding structures were obtained without intravenous contrast. Angiographic images of the head were obtained using MRA technique without contrast.  Comparison:  CT head without contrast 01/09/2013.  MRI brain and MRA head 06/25/2012.  MRI HEAD   Findings:  A 12 mm acute non hemorrhagic infarct is present within the left putamen.  The CT abnormalities correspond with chronic lacunar infarcts involving the left internal capsule and right thalamus.  These are new since the 04/22/2/14 CT, but not acute. Additional remote lacunar infarcts are present within the basal ganglia bilaterally.  A remote lacunar infarct in the left cerebellum is again noted.  Moderate periventricular white matter changes are otherwise stable.  There is T2 signal associated with the acute infarct.  The ventricles are of normal size.  Flow is present in the major intracranial arteries.  The patient is status post bilateral lens extractions.  The paranasal sinuses and mastoid air cells are clear.  IMPRESSION:  1.  Acute non hemorrhagic infarct involving the left putamen. 2.  Additional lacunar infarcts within the basal ganglia identified on the CT scan are not acute, but are new since April 2014. 3.  Remote lacunar infarcts are present in the basal ganglia bilaterally as well as the left cerebellum. 4.  Age advanced periventricular white matter changes are consistent with small vessel disease as well.  MRA HEAD  Findings: Mild atherosclerotic changes are present within the cavernous carotid arteries bilaterally.  There is marked tortuosity of the high right cervical ICA without stenosis.  The A1 and M1 segments demonstrate mild irregularity.  The anterior communicating artery is patent.  There is marked attenuation of distal MCA and ACA branch vessels bilaterally without significant proximal stenosis or occlusion.  The left vertebral artery is dominant.  There is marked attenuation of the distal right vertebral artery.  The basilar artery demonstrates a moderate proximal stenosis. The left vertebral artery is of fetal type.  There is a high-grade stenosis of the distal aspect of the right P1 segment. A prominent right posterior communicating artery is present.  Marked distal small vessel  attenuation is evident.  IMPRESSION:  1.  Advanced small vessel disease. 2.  Moderate proximal basilar artery stenosis. 3.  High-grade stenosis of the right P1 segment.  A right posterior communicating artery is present. 4.  Atherosclerotic changes of the cavernous carotid arteries bilaterally without significant stenoses. 5.  Marked tortuosity of the high cervical right ICA without stenosis.  This is most commonly seen in setting of chronic hypertension.   Original Report Authenticated By: Marin Roberts, M.D.    Scheduled Meds: . dipyridamole-aspirin  1 capsule Oral BID  . enoxaparin (LOVENOX) injection  40 mg Subcutaneous Q24H  . feeding supplement  30 mL Oral QAC breakfast  . insulin aspart  0-9 Units Subcutaneous TID  WC  . insulin glargine  15 Units Subcutaneous QHS  . lisinopril  20 mg Oral Daily  . simvastatin  40 mg Oral QPM   Continuous Infusions:   Principal Problem:   Hemiparesis Active Problems:   HYPERLIPIDEMIA   CAD, NATIVE VESSEL   CVA (cerebral infarction)   DM type 2, uncontrolled, with neuropathy   HTN (hypertension)    Time spent: 35    [facility], Alexandre Lightsey  [facility] Pager [phone]. If 7PM-7AM, please contact night-coverage at [email or web address], password [facility] 01/11/2013, 10:25 AM  LOS: 2 days

## 2013-01-11 NOTE — Evaluation (Signed)
Physical Therapy Evaluation Patient Details Name: [name] MRN: [id number] DOB: [birth year] Today's Date: 01/11/2013 Time: 4098-1191 PT Time Calculation (min): 15 min  PT Assessment / Plan / Recommendation History of Present Illness  Pt was adm with new lt CVA.  Pt was on floor at home for 3 days.  Pt with hx of rt CVA's and lt BKA.  Clinical Impression  Pt admitted with above. Pt currently with functional limitations due to the deficits listed below (see PT Problem List).  Pt will benefit from skilled PT to increase their independence and safety with mobility to allow discharge to the venue listed below.       PT Assessment  Patient needs continued PT services    Follow Up Recommendations  CIR    Does the patient have the potential to tolerate intense rehabilitation      Barriers to Discharge        Equipment Recommendations  None recommended by PT    Recommendations for Other Services     Frequency Min 4X/week    Precautions / Restrictions Precautions Precautions: Fall Required Braces or Orthoses: Other Brace/Splint Other Brace/Splint: Has prosthesis at home   Pertinent Vitals/Pain No pain.      Mobility  Bed Mobility Bed Mobility: Supine to Sit;Sitting - Scoot to Edge of Bed;Sit to Supine Supine to Sit: 4: Min assist;HOB elevated Sitting - Scoot to Edge of Bed: 5: Supervision Sit to Supine: 5: Supervision Details for Bed Mobility Assistance: Assist to bring trunk up. Transfers Details for Transfer Assistance: Attempted sit to stand but unable with 1 person assist and without prosthesis.    Exercises     PT Diagnosis: Difficulty walking;Generalized weakness  PT Problem List: Decreased strength;Decreased balance;Decreased mobility;Decreased knowledge of use of DME;Decreased safety awareness;Decreased knowledge of precautions;Obesity PT Treatment Interventions: DME instruction;Gait training;Functional mobility training;Therapeutic activities;Therapeutic  exercise;Balance training;Patient/family education     PT Goals(Current goals can be found in the care plan section) Acute Rehab PT Goals Patient Stated Goal: Go home PT Goal Formulation: With patient Time For Goal Achievement: 01/18/13 Potential to Achieve Goals: Good  Visit Information  Last PT Received On: 01/11/13 Assistance Needed: +2 History of Present Illness: Pt was adm with new lt CVA.  Pt was on floor at home for 3 days.  Pt with hx of rt CVA's and lt BKA.       Prior Functioning  Home Living Family/patient expects to be discharged to:: Private residence Living Arrangements: Alone Available Help at Discharge: Family;Available PRN/intermittently Type of Home: House Home Access: Ramped entrance Home Layout: One level Home Equipment: Walker - 2 wheels;Shower seat;Bedside commode;Cane - single point;Wheelchair - manual  Lives With: Alone Prior Function Level of Independence: Independent with assistive device(s) Comments: Pt was amb with rolling walker and her prosthesis. Communication Communication: No difficulties Dominant Hand: Right    Cognition  Cognition Arousal/Alertness: Awake/alert Behavior During Therapy: WFL for tasks assessed/performed Overall Cognitive Status: History of cognitive impairments - at baseline Area of Impairment: Memory;Safety/judgement Memory: Decreased short-term memory;Decreased recall of precautions Safety/Judgement: Decreased awareness of deficits;Decreased awareness of safety General Comments: Pt feels she can go home alone even though she could not come to standing with me.  Outpt PTA reports that pt with difficulty with memory.    Extremity/Trunk Assessment Upper Extremity Assessment Upper Extremity Assessment: Defer to OT evaluation Lower Extremity Assessment Lower Extremity Assessment: Generalized weakness   Balance Balance Balance Assessed: Yes Static Sitting Balance Static Sitting - Balance Support: Bilateral upper  extremity supported Static Sitting - Level of Assistance: 6: Modified independent (Device/Increase time)  End of Session PT - End of Session Equipment Utilized During Treatment: Gait belt Activity Tolerance: Patient tolerated treatment well Patient left: in bed;with call bell/phone within reach Nurse Communication: Mobility status  GP     [facility] 01/11/2013, 3:32 PM  [facility] PT [phone]

## 2013-01-11 NOTE — Progress Notes (Signed)
Stroke Team Progress Note  HISTORY [name] is an 57 y.o. female who was seen in hospital back in 06/2012 for right hippocampal infarct as well as a right frontal lobe tiny infarct. Infarcts were felt to be embolic secondary to unknown etiology. Patient has had a loop recorder in past unrevealing for atrial fibrillation x 2 yrs. TTE and TEE were normal. MRA brain on 06/25/2012 No major vessel occlusion or correctable proximal stenosis. Diffuse intracranial atherosclerotic disease with narrowing and irregularity of the medium small vessels diffusely. 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 During that admission patient was changed from Plavix to Aggrenox by Dr. Pearlean Brownie with goal to be on Aggrenox BID within one week. Patient returns to hospital due to increased right leg weakness since 01/05/13--Patient states she was changing her sheets on her bed when she lost strength in her right leg and fell. She states she was on the floor for 3 days until her sister came over to help her. She denied any UE weakness but did feel her left LE was also weak at time of event. MRI on admission shows a new acute left putamen infarct. Per cahrt review patient has been only taking Aggrenox one capsule QHS.  Patient was not a TPA candidate secondary to delay in arrival. She was admitted for further evaluation and treatment.  SUBJECTIVE Her sister is at the bedside.  Overall she feels her condition is gradually improving.   OBJECTIVE Most recent Vital Signs: Filed Vitals:   01/10/13 0700 01/10/13 1405 01/10/13 2129 01/11/13 0551  BP: 166/84 162/83 140/64 137/59  Pulse: 87 84 86 82  Temp: 97.9 F (36.6 C) 98.5 F (36.9 C) 97.9 F (36.6 C) 98.2 F (36.8 C)  TempSrc:  Oral Oral Oral  Resp: 18 17 18 24   Height:      Weight:      SpO2: 100% 98% 98% 100%   CBG (last 3)   Recent Labs  01/10/13 1724 01/10/13 2133 01/11/13 0801  GLUCAP 152* 164* 128*    IV Fluid Intake:      MEDICATIONS  . dipyridamole-aspirin  1 capsule Oral BID  . enoxaparin (LOVENOX) injection  40 mg Subcutaneous Q24H  . feeding supplement  30 mL Oral QAC breakfast  . insulin aspart  0-9 Units Subcutaneous TID WC  . insulin glargine  15 Units Subcutaneous QHS  . lisinopril  20 mg Oral Daily  . simvastatin  40 mg Oral QPM   PRN:  acetaminophen, senna-docusate  Diet:  Carb Control thin liquids Activity:  OOB with assistance DVT Prophylaxis:  Lovenox 40 mg sq daily   CLINICALLY SIGNIFICANT STUDIES Basic Metabolic Panel:  Recent Labs Lab 01/09/13 2222 01/10/13 0942 01/10/13 1100  NA 134* 138  --   K 2.9* 3.4*  --   CL 95* 102  --   CO2 27 26  --   GLUCOSE 185* 173*  --   BUN 15 9  --   CREATININE 0.67 0.61  --   CALCIUM 8.5 8.5  --   MG  --   --  1.7   Liver Function Tests:  Recent Labs Lab 01/09/13 2222  AST 23  ALT 16  ALKPHOS 121*  BILITOT 0.4  PROT 6.1  ALBUMIN 2.7*   CBC:  Recent Labs Lab 01/09/13 2222 01/10/13 0942  WBC 11.5* 9.7  NEUTROABS 7.9*  --   HGB 11.9* 12.3  HCT 35.2* 36.5  MCV 81.7 81.8  PLT 238 217   Coagulation: No results found for this basename: LABPROT, INR,  in the last 168 hours Cardiac Enzymes:  Recent Labs Lab 01/09/13 2222  CKTOTAL 838*   Urinalysis:  Recent Labs Lab 01/10/13 1734  COLORURINE YELLOW  LABSPEC 1.008  PHURINE 7.0  GLUCOSEU NEGATIVE  HGBUR TRACE*  BILIRUBINUR NEGATIVE  KETONESUR NEGATIVE  PROTEINUR NEGATIVE  UROBILINOGEN 1.0  NITRITE NEGATIVE  LEUKOCYTESUR SMALL*   Lipid Panel    Component Value Date/Time   CHOL 202* 01/10/2013 0942   TRIG 138 01/10/2013 0942   HDL 42 01/10/2013 0942   CHOLHDL 4.8 01/10/2013 0942   VLDL 28 01/10/2013 0942   LDLCALC 132* 01/10/2013 0942   HgbA1C  Lab Results  Component Value Date   HGBA1C 9.2* 01/10/2013    Urine Drug Screen:     Component Value Date/Time   LABOPIA NONE DETECTED 06/25/2012 0351   COCAINSCRNUR NONE DETECTED 06/25/2012 0351   LABBENZ NONE  DETECTED 06/25/2012 0351   AMPHETMU NONE DETECTED 06/25/2012 0351   THCU NONE DETECTED 06/25/2012 0351   LABBARB NONE DETECTED 06/25/2012 0351    Alcohol Level: No results found for this basename: ETH,  in the last 168 hours  CT of the brain  01/09/2013  Interval bilateral thalamic lacunar infarcts, likely subacute. No acute intracranial findings identified.    MRI of the brain  01/10/2013    1.  Acute non hemorrhagic infarct involving the left putamen. 2.  Additional lacunar infarcts within the basal ganglia identified on the CT scan are not acute, but are new since April 2014. 3.  Remote lacunar infarcts are present in the basal ganglia bilaterally as well as the left cerebellum. 4.  Age advanced periventricular white matter changes are consistent with small vessel disease as well  MRA of the brain  01/10/2013   1.  Advanced small vessel disease. 2.  Moderate proximal basilar artery stenosis. 3.  High-grade stenosis of the right P1 segment.  A right posterior communicating artery is present. 4.  Atherosclerotic changes of the cavernous carotid arteries bilaterally without significant stenoses. 5.  Marked tortuosity of the high cervical right ICA without stenosis.  This is most commonly seen in setting of chronic hypertension.     2D Echocardiogram  EF 55-60% with no source of embolus.   Carotid Doppler  No evidence of hemodynamically significant internal carotid artery stenosis. Vertebral artery flow is antegrade.   CXR   01/10/2013   No acute abnormalities seen.  01/09/2013   No acute cardiopulmonary disease.   EKG  normal sinus rhythm.   Therapy Recommendations   Physical Exam   Pleasant middle aged lady not in distress.Awake alert. Afebrile. Head is nontraumatic. Neck is supple without bruit. Hearing is normal. Cardiac exam no murmur or gallop. Lungs are clear to auscultation. Distal pulses are well felt. Neurological Exam ; Awake alert oriented x 3 normal speech and language. Mild right lower  face asymmetry. Tongue midline. No drift. Mild diminished fine finger movements on right. Orbits left over right upper extremity. Mild right grip weak.. Normal sensation . Normal coordination.;  ASSESSMENT [name] is a 57 y.o. female presenting with right leg weakness.  Imaging confirms a left putamen infarct. Infarct felt to be thrombotic secondary to small vessel disease.  On dipyridamole SR 250 mg/aspirin 25 mg orally once a day prior to admission. Now on dipyridamole SR 250 mg/aspirin 25 mg orally twice a day for secondary stroke prevention. Patient with resultant right  hemiparesis. Work up completed.  Hypertension Hyperlipidemia, LDL 132, on zocor 40 PTA, now on zocor 40, goal LDL < 100 (< 70 for diabetics) Diabetes, HgbA1c 9.2, goal < 7.0 Obesity, Body mass index is 31.33 kg/(m^2).  Cigarette smoker  Hx right hippocampal infarct as well as a right frontal lobe tiny infarct. Infarcts were felt to be embolic secondary to unknown etiology. Loop recorder neg for atrial fibrillation, TEE was normal.   Hospital day # 2  TREATMENT/PLAN  Agree with increase from once a day to dipyridamole SR 250 mg/aspirin 25 mg orally twice a day for secondary stroke prevention.  Therapy evals - disposition per their recommendation Ongoing risk factor control by Primary Care Physician No further stroke workup indicated. Patient has a 10-15% risk of having another stroke over the next year, the highest risk is within 2 weeks of the most recent stroke/TIA (risk of having a stroke following a stroke or TIA is the same). Stroke Service will sign off. Please call should any needs arise. Follow up with Dr. Pearlean Brownie, Stroke Clinic, in 2 months.   Annie Main, MSN, RN, ANVP-BC, ANP-BC, [facility] Pager: [phone] 01/11/2013 10:46 AM  I have personally obtained a history, examined the patient, evaluated imaging results, and formulated the assessment and plan of care. I agree with  the above. Delia Heady, MD

## 2013-01-11 NOTE — Progress Notes (Signed)
Rehab Admissions Coordinator Note:  Patient was screened by Trish Mage for appropriateness for an Inpatient Acute Rehab Consult. Noted PT recommending CIR.   At this time, we are recommending Inpatient Rehab consult.  Trish Mage 01/11/2013, 3:58 PM  I can be reached at [phone].

## 2013-01-12 DIAGNOSIS — I6330 Cerebral infarction due to thrombosis of unspecified cerebral artery: Secondary | ICD-10-CM

## 2013-01-12 DIAGNOSIS — G8110 Spastic hemiplegia affecting unspecified side: Secondary | ICD-10-CM

## 2013-01-12 LAB — BASIC METABOLIC PANEL
GFR calc non Af Amer: >90 mL/min (ref >90)
Glucose, Bld: 137 mg/dL — ABNORMAL HIGH (ref 70–99)
Potassium: 3.2 mEq/L — ABNORMAL LOW (ref 3.5–5.1)
Sodium: 139 mEq/L (ref 135–145)

## 2013-01-12 LAB — GLUCOSE, CAPILLARY: Glucose-Capillary: 147 mg/dL — ABNORMAL HIGH (ref 70–99)

## 2013-01-12 LAB — CBC
Hemoglobin: 11.8 g/dL — ABNORMAL LOW (ref 12.0–15.0)
MCHC: 32.4 g/dL (ref 30.0–36.0)
RBC: 4.37 MIL/uL (ref 3.87–5.11)
WBC: 9.3 10*3/uL (ref 4.0–10.5)

## 2013-01-12 MED ORDER — POTASSIUM CHLORIDE CRYS ER 20 MEQ PO TBCR
40.0000 meq | EXTENDED_RELEASE_TABLET | Freq: Once | ORAL | Status: AC
  Administered 2013-01-12: 40 meq via ORAL
  Filled 2013-01-12: qty 2

## 2013-01-12 MED ORDER — INSULIN GLARGINE 100 UNIT/ML ~~LOC~~ SOLN
25.0000 [IU] | Freq: Every day | SUBCUTANEOUS | Status: DC
  Administered 2013-01-12: 25 [IU] via SUBCUTANEOUS
  Filled 2013-01-12 (×2): qty 0.25

## 2013-01-12 NOTE — Progress Notes (Signed)
Physical Therapy Treatment Patient Details Name: [name] MRN: [id number] DOB: [birth year] Today's Date: 01/12/2013 Time: 1349-1410 PT Time Calculation (min): 21 min  PT Assessment / Plan / Recommendation  History of Present Illness Pt was adm with new lt CVA.  Pt was on floor at home for 3 days.  Pt with hx of rt CVA's and lt BKA.   PT Comments   Progressing today. Performing transfers. Still hoping family will bring her prosthesis.   Follow Up Recommendations  CIR     Does the patient have the potential to tolerate intense rehabilitation     Barriers to Discharge        Equipment Recommendations  None recommended by PT    Recommendations for Other Services    Frequency Min 4X/week   Progress towards PT Goals Progress towards PT goals: Progressing toward goals  Plan Current plan remains appropriate    Precautions / Restrictions Precautions Precautions: Fall Required Braces or Orthoses: Other Brace/Splint Other Brace/Splint: Has prosthesis at home Restrictions Weight Bearing Restrictions: No   Pertinent Vitals/Pain Denies pain    Mobility  Bed Mobility Bed Mobility: Supine to Sit Supine to Sit: 4: Min guard;With rails Sitting - Scoot to Edge of Bed: 4: Min guard Details for Bed Mobility Assistance: cues for efficiency Transfers Transfers: Sit to Stand;Stand to Sit;Stand Pivot Transfers Sit to Stand: 3: Mod assist;From bed;From chair/3-in-1;With upper extremity assist;With armrests Stand to Sit: 3: Mod assist;To chair/3-in-1;With upper extremity assist;With armrests Stand Pivot Transfers: 3: Mod assist Details for Transfer Assistance: sequencing cues as well as stability assist; pt had a very difficult time flexing at hips/knees during stand->sit needing heavy assist to control descent, second time she sat down she had an easier attempt needing only modA Ambulation/Gait Ambulation/Gait Assistance: Not tested (comment) Modified Rankin (Stroke Patients  Only) Pre-Morbid Rankin Score: Slight disability Modified Rankin: Moderately severe disability      PT Goals (current goals can now be found in the care plan section)    Visit Information  Last PT Received On: 01/12/13 Assistance Needed: +2 History of Present Illness: Pt was adm with new lt CVA.  Pt was on floor at home for 3 days.  Pt with hx of rt CVA's and lt BKA.    Subjective Data      Cognition  Cognition Arousal/Alertness: Awake/alert Behavior During Therapy: WFL for tasks assessed/performed Overall Cognitive Status: History of cognitive impairments - at baseline Area of Impairment: Memory;Safety/judgement;Problem solving Memory: Decreased short-term memory;Decreased recall of precautions Safety/Judgement: Decreased awareness of deficits;Decreased awareness of safety Problem Solving: Decreased initiation;Difficulty sequencing;Requires verbal cues;Requires tactile cues;Slow processing General Comments: slow to respond    Balance     End of Session PT - End of Session Equipment Utilized During Treatment: Gait belt Activity Tolerance: Patient tolerated treatment well Patient left: in chair;with call bell/phone within reach Nurse Communication: Mobility status   GP     Ludger Nutting 01/12/2013, 2:24 PM

## 2013-01-12 NOTE — PMR Pre-admission (Signed)
PMR Admission Coordinator Pre-Admission Assessment  Patient: [name] is an 57 y.o., female MRN: [id number] DOB: Oct 18, 1955 Height: 5\' 6"  (167.6 cm) Weight: 88 kg (194 lb 0.1 oz)              Insurance Information HMO:     PPO:      PCP:      IPA:      80/20:      OTHER:  PRIMARY: [employer] policy termed 12/01/12 when pt laid off from work        MEDICAID Application Date: pending      Case Manager: tba Disability Application Date: pending      Case Worker: tba 01/12/13 I requested Lennox Solders, financial counselor to initiate disability and Medicaid applications per pt and niece request. Phone [phone]  Emergency Contact Information Contact Information   Name Relation Home Work Mobile   [place] Niece [phone]    Anthony, Roland Father [phone]     Irma Newness [phone]       Current Medical History  Patient Admitting Diagnosis: *Left putamen will infarct in a patient with previous left BKA  History of Present Illness:[name] is a 57 y.o. 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 Patient used rolling walker as well as a left prosthesis prior to admission. Admitted 01/10/2013 with right-sided weakness. MRI of the brain showed acute nonhemorrhagic infarct involving the left putamen. Remote lacunar infarcts in the basal ganglia bilaterally as well as left cerebellum. MRA of the head was advanced small vessel disease moderate proximal basilar artery stenosis. Echocardiogram with ejection fraction of 60% grade 1 diastolic dysfunction. Patient with TEE as well as loop recorder in the past from prior CVA that was unremarkable. Carotid Dopplers with no ICA stenosis. Patient did not receive TPA. Neurology services consulted maintained on Aggrenox therapy for CVA  prophylaxis as well as subcutaneous Lovenox for DVT prophylaxis. Hemoglobin A1c of 9.2 remained on insulin therapy as directed.   Total: 0  NIH     Past Medical History  Past Medical History  Diagnosis Date  . Atypical chest pain     a. non obstructive by cath in 2008 and 2010;  b. 02/2012 Myoview: non-ischemic, EF 57%  . HTN (hypertension)   . Hyperlipidemia   . Syncope and collapse     a. near-syncopal episode in November 2008;  b. 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  . CVA (cerebral infarction)     a.  Small right parietal noted incidentally 04/2007;  b. right sided embolic CVA 05/2012;  c. 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 Peripheral neuropathy   . Obesity, unspecified   . Esophageal reflux   . Diabetes mellitus, type II   . Diaphragmatic hernia without mention of obstruction or gangrene   . Irritable bowel syndrome   . Morbid obesity   . Cellulitis     a. left foot-> s/p [name] BKA  . Stroke     2013-'no residual'  . Vertigo   . Dry skin   . Constipation   . Tobacco abuse     Family History  family history includes Coronary artery disease in her father; Diabetes in her father; Gallbladder disease in her mother; Heart failure in her mother; Pneumonia  in her mother. There is no history of Stroke.  Prior Rehab/Hospitalizations: CIR 12/2011 and d/c'd home at Mod I level   Current Medications  Current facility-administered medications:acetaminophen (TYLENOL) tablet 650 mg, 650 mg, Oral, Q4H PRN, Kendra P Hiatt, RPH, 650 mg at 01/12/13 1255;  dipyridamole-aspirin (AGGRENOX) 200-25 MG per 12 hr capsule 1 capsule, 1 capsule, Oral, BID, Ulice Dash, PA-C, 1 capsule at 01/13/13 2952;  enoxaparin (LOVENOX) injection 40 mg, 40 mg, Subcutaneous, Q24H, Zannie Cove, MD, 40 mg at 01/13/13 8413 feeding supplement (PRO-STAT SUGAR FREE 64) liquid 30 mL, 30 mL, Oral, QAC  breakfast, Ashley Jacobs, RD, 30 mL at 01/13/13 0938;  insulin aspart (novoLOG) injection 0-9 Units, 0-9 Units, Subcutaneous, TID WC, Zannie Cove, MD, 1 Units at 01/12/13 1842;  insulin glargine (LANTUS) injection 25 Units, 25 Units, Subcutaneous, QHS, Nishant Dhungel, MD, 25 Units at 01/12/13 2255 lisinopril (PRINIVIL,ZESTRIL) tablet 20 mg, 20 mg, Oral, Daily, Zannie Cove, MD, 20 mg at 01/13/13 2440;  phenol (CHLORASEPTIC) mouth spray 1 spray, 1 spray, Mouth/Throat, PRN, Joseph Art, DO, 1 spray at 01/11/13 1849;  senna-docusate (Senokot-S) tablet 1 tablet, 1 tablet, Oral, QHS PRN, Zannie Cove, MD;  simvastatin (ZOCOR) tablet 40 mg, 40 mg, Oral, QPM, Zannie Cove, MD, 40 mg at 01/12/13 1842  Patients Current Diet: Carb Control  Precautions / Restrictions Precautions Precautions: Fall Other Brace/Splint: Has prosthesis at home Restrictions Weight Bearing Restrictions: No   Prior Activity Level Community (5-7x/wk): pt was laid off from work 10/2012 with [employer]. drove sellf fulltime work  Journalist, newspaper / Corporate investment banker Devices/Equipment: Crutches;Prosthesis;Bedside commode/3-in-1;Wheelchair;Walker (specify type) Home Equipment: Walker - 2 wheels;Shower seat;Bedside commode;Cane - single point;Wheelchair - manual  Prior Functional Level Prior Function Level of Independence: Independent with assistive device(s) Comments: Pt was amb with rolling walker and her prosthesis.  Current Functional Level Cognition  Arousal/Alertness: Awake/alert Overall Cognitive Status: History of cognitive impairments - at baseline Orientation Level: Oriented X4 Safety/Judgement: Decreased awareness of deficits;Decreased awareness of safety General Comments: slow to respond Attention: Sustained Sustained Attention: Appears intact Memory: Impaired Memory Impairment: Prospective memory;Decreased short term memory;Decreased recall of new information;Retrieval  deficit Decreased Short Term Memory: Verbal basic;Functional basic Awareness: Impaired Awareness Impairment: Anticipatory impairment;Emergent impairment;Intellectual impairment Problem Solving: Appears intact (for verbal ) Safety/Judgment: Impaired    Extremity Assessment (includes Sensation/Coordination)          ADLs  Grooming: Performed;Wash/dry face;Teeth care;Set up Where Assessed - Grooming: Unsupported sitting Lower Body Dressing: Performed;Maximal assistance (don/doff sock at EOB) Where Assessed - Lower Body Dressing: Unsupported sitting Toilet Transfer: Other (comment) (deferred: need +2 A and pt does not have prosthesis) Transfers/Ambulation Related to ADLs: Bed mobility S overall including scooting up to [facility]. Sitting balance S at EOB. Transfers not attempted due to not having +2 A and pt's prosthesis still at home. ADL Comments: Patient demonstrated decreased problem solving, decreased memory, decreased safety. Able to doff sock but unable to donn it sitting EOB despite multiple attempts and instruction on technique from therapist.    Mobility  Bed Mobility: Supine to Sit Supine to Sit: 4: Min guard;With rails Sitting - Scoot to Edge of Bed: 4: Min guard Sit to Supine: 5: Supervision    Transfers  Transfers: Sit to Stand;Stand to Sit;Stand Pivot Transfers Sit to Stand: 3: Mod assist;From bed;From chair/3-in-1;With upper extremity assist;With armrests Stand to Sit: 3: Mod assist;To chair/3-in-1;With upper extremity assist;With armrests Stand Pivot Transfers: 3: Mod assist    Ambulation / Gait /  Stairs / Psychologist, prison and probation services  Ambulation/Gait Ambulation/Gait Assistance: Not tested (comment)    Posture / Balance Static Sitting Balance Static Sitting - Balance Support: Bilateral upper extremity supported Static Sitting - Level of Assistance: 6: Modified independent (Device/Increase time)    Special needs/care consideration Bowel mgmt:continent Bladder  mgmt:continent Diabetic mgmtHgb A1C= 9.2   Previous Home Environment Living Arrangements: Alone  Lives With: Alone Available Help at Discharge: Family;Available PRN/intermittently Type of Home: House Home Layout: One level Home Access: Ramped entrance Bathroom Shower/Tub: Engineer, manufacturing systems: Standard Bathroom Accessibility: Yes How Accessible: Accessible via walker Home Care Services: No Additional Comments: niece checks in on pt daily; very involved family  Discharge Living Setting Plans for Discharge Living Setting: Patient's home;Alone Type of Home at Discharge: House Discharge Home Layout: One level Discharge Home Access: Ramped entrance Discharge Bathroom Shower/Tub: Tub/shower unit Discharge Bathroom Toilet: Standard Discharge Bathroom Accessibility: Yes How Accessible: Accessible via walker Does the patient have any problems obtaining your medications?: Yes (Describe) (no insurance; 10 weeks pay after June only)  Social/Family/Support Systems Contact Information: Enedina Finner , niece Anticipated Caregiver: niece will check in on her daily as well as pt's sister Anticipated Caregiver's Contact Information: [phone]; work [phone] [facility] Ability/Limitations of Caregiver: prn daily only Caregiver Availability: Intermittent Discharge Plan Discussed with Primary Caregiver: Yes Is Caregiver In Agreement with Plan?: Yes Does Caregiver/Family have Issues with Lodging/Transportation while Pt is in Rehab?: No    Goals/Additional Needs Patient/Family Goal for Rehab: supervision to Mod I PT and OT Expected length of stay: ELOS 10 to 12 days Equipment Needs: Niece to bring in pt's prosthesis on 01/13/13 per my request Pt/Family Agrees to Admission and willing to participate: Yes Program Orientation Provided & Reviewed with Pt/Caregiver Including Roles  & Responsibilities: Yes   Decrease burden of Care through IP rehab admission: n/a  Possible need  for SNF placement upon discharge:not anticipated. Niece and pt did discuss hopeful in the future to go to a senior citizens' retirement apartment, but I discussed funding needed or Medicaid which she currently has neither.   Patient Condition: This patient's condition remains as documented in the consult dated 01/12/2013, in which the Rehabilitation Physician determined and documented that the patient's condition is appropriate for intensive rehabilitative care in an inpatient rehabilitation facility. Will admit to inpatient rehab today.  Preadmission Screen Completed By:  Clois Dupes, 01/13/2013 11:53 AM ______________________________________________________________________   Discussed status with Dr. Wynn Banker on 01/13/2013 at 1154 and received telephone approval for admission today.  Admission Coordinator:  Clois Dupes, time 5284 Date 01/13/2013.

## 2013-01-12 NOTE — Progress Notes (Signed)
Occupational Therapy Evaluation Patient Details Name: [name] MRN: [id number] DOB: 09-Feb-1956 Today's Date: 01/12/2013 Time: 6578-4696 OT Time Calculation (min): 15 min  OT Assessment / Plan / Recommendation History of present illness Pt was adm with new lt CVA.  Pt was on floor at home for 3 days.  Pt with hx of rt CVA's and lt BKA.   Clinical Impression   Patient presents to OT with decreased ADL independence and safety s/p acute CVA. Will benefit from OT to maximize independence and facilitate safe discharge.    OT Assessment  Patient needs continued OT Services    Follow Up Recommendations  CIR    Barriers to Discharge Decreased caregiver support    Equipment Recommendations  None recommended by OT    Recommendations for Other Services    Frequency  Min 2X/week    Precautions / Restrictions Precautions Precautions: Fall Required Braces or Orthoses: Other Brace/Splint Other Brace/Splint: Has prosthesis at home Restrictions Weight Bearing Restrictions: No   Pertinent Vitals/Pain     ADL  Grooming: Performed;Wash/dry face;Teeth care;Set up Where Assessed - Grooming: Unsupported sitting Lower Body Dressing: Performed;Maximal assistance (don/doff sock at EOB) Where Assessed - Lower Body Dressing: Unsupported sitting Toilet Transfer: Other (comment) (deferred: need +2 A and pt does not have prosthesis) Transfers/Ambulation Related to ADLs: Bed mobility S overall including scooting up to [facility]. Sitting balance S at EOB. Transfers not attempted due to not having +2 A and pt's prosthesis still at home. ADL Comments: Patient demonstrated decreased problem solving, decreased memory, decreased safety. Able to doff sock but unable to donn it sitting EOB despite multiple attempts and instruction on technique from therapist.    OT Diagnosis: Generalized weakness;Cognitive deficits  OT Problem List: Decreased strength;Impaired balance (sitting and/or standing);Decreased  cognition;Decreased safety awareness OT Treatment Interventions: Self-care/ADL training;Therapeutic exercise;Neuromuscular education;DME and/or AE instruction;Therapeutic activities;Cognitive remediation/compensation   OT Goals(Current goals can be found in the care plan section) Acute Rehab OT Goals Patient Stated Goal: Go home OT Goal Formulation: With patient Time For Goal Achievement: 01/26/13 Potential to Achieve Goals: Good  Visit Information  Last OT Received On: 01/12/13 Assistance Needed: +1 (only for EOB activities) History of Present Illness: Pt was adm with new lt CVA.  Pt was on floor at home for 3 days.  Pt with hx of rt CVA's and lt BKA.       Prior Functioning     Home Living Family/patient expects to be discharged to:: Private residence Living Arrangements: Alone Available Help at Discharge: Family;Available PRN/intermittently Type of Home: House Home Access: Ramped entrance Home Layout: One level Home Equipment: Walker - 2 wheels;Shower seat;Bedside commode;Cane - single point;Wheelchair - manual  Lives With: Alone Prior Function Level of Independence: Independent with assistive device(s) Comments: Pt was amb with rolling walker and her prosthesis. Communication Communication: No difficulties Dominant Hand: Right         Vision/Perception Vision - History Baseline Vision: Wears glasses only for reading Patient Visual Report: No change from baseline   Cognition  Cognition Arousal/Alertness: Awake/alert Behavior During Therapy: WFL for tasks assessed/performed Overall Cognitive Status: History of cognitive impairments - at baseline Area of Impairment: Memory;Safety/judgement Memory: Decreased short-term memory;Decreased recall of precautions Safety/Judgement: Decreased awareness of deficits;Decreased awareness of safety General Comments: Pt feels she can go home alone even though she could not perform ADLs at independent level.    Extremity/Trunk  Assessment Upper Extremity Assessment Upper Extremity Assessment: Overall WFL for tasks assessed Lower Extremity Assessment Lower Extremity  Assessment: Defer to PT evaluation     Mobility       Exercise     Balance     End of Session OT - End of Session Activity Tolerance: Patient tolerated treatment well Patient left: in bed;with call bell/phone within reach  GO     Jordane Hisle A 01/12/2013, 10:04 AM

## 2013-01-12 NOTE — Progress Notes (Signed)
I met with pt, niece and aunt at bedside. Patient in agreement to admission to inpt rehab. I will clarify [employer] insurance termed 7/14 when pt laid off from work or self pay. I am hopeful to admit pt tomorrow. [phone]

## 2013-01-12 NOTE — Progress Notes (Signed)
[facility] PROGRESS NOTE  [name] EAV:[id number] DOB: [birth year] DOA: 01/09/2013 PCP: [name], MD   Brief narrative: 57 y.o. female with history of diabetes, hypertension, CVA, left BKA, who usually ambulates using a wheelchair or using her prosthesis occasionally brought in after being unsteady and unable to get up. Head CT negative. W/up for CCVA showed acute left putamen infarct.   Assessment/Plan: CVA with rt sided weakness  acute left putamen infarct on MRI  echo shows normal EF with grade 1 diastolic dysfn. Carotid shows non specific stenosis. Weakness and facial droop improving. PT recommends CIR. Consult placed appreciate neurology recommendation. patient on daily aggranox which has been switched to bid. Noted for hyperlipidemia.  On simvastatin 40 mg daily at home . Noted for midldly elevated CPK on admission. Will monitor in am and if stable will increase dose.  Uncontrolled DM with A1C of 9.2 Needs aggressive risk factor modification.  Uncontrolled DM  A1C of 9.2. On amaryl, metformin and lantus 20 units daily at home. Increase lantus to 25 units.  Hypokalemia  repleted. Secondary to HCTZ use.   HTN  allow permissive BP. Continue  lisinopril  Code Status:full Family Communication:none at bedside Disposition Plan: CIR   Consultants:  neuro  Procedures:  none  Antibiotics:  none  HPI/Subjective: Patient seen and examined this am. Reports her rt sided strength to be better.   Objective: Filed Vitals:   01/12/13 1300  BP: 177/74  Pulse: 77  Temp: 97.7 F (36.5 C)  Resp: 16    Intake/Output Summary (Last 24 hours) at 01/12/13 1440 Last data filed at 01/12/13 0600  Gross per 24 hour  Intake      0 ml  Output      0 ml  Net      0 ml   Filed Weights   01/10/13 0139  Weight: 88 kg (194 lb 0.1 oz)    Exam:   General:  Middle aged female in NAD  HEENT: no pallor, moist oral mucosa  Chest : clear b/l, nom added  sounds  CVS: NS1&S2, no murmurs, rubs or gallop  Ext: left BKA  Cns: AAOx3, Rt facial droop 4+/5 power over rt extremity    Data Reviewed: Basic Metabolic Panel:  Recent Labs Lab 01/09/13 2222 01/10/13 0942 01/10/13 1100 01/12/13 0555  NA 134* 138  --  139  K 2.9* 3.4*  --  3.2*  CL 95* 102  --  103  CO2 27 26  --  25  GLUCOSE 185* 173*  --  137*  BUN 15 9  --  10  CREATININE 0.67 0.61  --  0.71  CALCIUM 8.5 8.5  --  8.7  MG  --   --  1.7  --    Liver Function Tests:  Recent Labs Lab 01/09/13 2222  AST 23  ALT 16  ALKPHOS 121*  BILITOT 0.4  PROT 6.1  ALBUMIN 2.7*   No results found for this basename: LIPASE, AMYLASE,  in the last 168 hours No results found for this basename: AMMONIA,  in the last 168 hours CBC:  Recent Labs Lab 01/09/13 2222 01/10/13 0942 01/12/13 0555  WBC 11.5* 9.7 9.3  NEUTROABS 7.9*  --   --   HGB 11.9* 12.3 11.8*  HCT 35.2* 36.5 36.4  MCV 81.7 81.8 83.3  PLT 238 217 223   Cardiac Enzymes:  Recent Labs Lab 01/09/13 2222  CKTOTAL 838*   BNP (last 3 results) No results found for this  basename: PROBNP,  in the last 8760 hours CBG:  Recent Labs Lab 01/11/13 1211 01/11/13 1703 01/11/13 2137 01/12/13 0808 01/12/13 1152  GLUCAP 173* 140* 169* 137* 206*    No results found for this or any previous visit (from the past 240 hour(s)).   Studies: No results found.  Scheduled Meds: . dipyridamole-aspirin  1 capsule Oral BID  . enoxaparin (LOVENOX) injection  40 mg Subcutaneous Q24H  . feeding supplement  30 mL Oral QAC breakfast  . insulin aspart  0-9 Units Subcutaneous TID WC  . insulin glargine  15 Units Subcutaneous QHS  . lisinopril  20 mg Oral Daily  . simvastatin  40 mg Oral QPM   Continuous Infusions:     Time spent: 25 minutes    Mahsa Hanser  [facility] Pager [phone] If 7PM-7AM, please contact night-coverage at [email or web address], password [email or web address] 01/12/2013, 2:41 PM  LOS: 3 days

## 2013-01-12 NOTE — Consult Note (Signed)
Physical Medicine and Rehabilitation Consult Reason for Consult: CVA Referring Physician: [facility]   HPI: [name] is a 57 y.o. 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 Patient used rolling walker as well as a left prosthesis prior to admission. Admitted 01/10/2013 with right-sided weakness. MRI of the brain showed acute nonhemorrhagic infarct involving the left putamen. Remote lacunar infarcts in the basal ganglia bilaterally as well as left cerebellum. MRA of the head was advanced small vessel disease moderate proximal basilar artery stenosis. Echocardiogram with ejection fraction of 60% grade 1 diastolic dysfunction. Patient with TEE as well as loop recorder in the past from prior CVA that was unremarkable. Carotid Dopplers with no ICA stenosis. Patient did not receive TPA. Neurology services consulted maintained on Aggrenox therapy for CVA prophylaxis as well as subcutaneous Lovenox for DVT prophylaxis. Hemoglobin A1c of 9.2 remained on insulin therapy as directed. Patient states that sometimes she used a cane cannulated at home with a prosthesis and sometimes even used wheelchair Review of Systems  Cardiovascular:       Atypical chest pain  Gastrointestinal: Positive for constipation.       GERD  Neurological:       Syncope   Past Medical History  Diagnosis Date  . Atypical chest pain     a. non obstructive by cath in 2008 and 2010;  b. 02/2012 Myoview: non-ischemic, EF 57%  . HTN (hypertension)   . Hyperlipidemia   . Syncope and collapse     a. near-syncopal episode in November 2008;  b. 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  . CVA (cerebral infarction)     a.  Small right parietal noted incidentally 04/2007;  b. right sided embolic CVA 05/2012;  c. 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 Peripheral neuropathy   . Obesity, unspecified   . Esophageal reflux   . Diabetes mellitus, type II   . Diaphragmatic hernia without mention of obstruction or gangrene   . Irritable bowel syndrome   . Morbid obesity   . Cellulitis     a. 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 Stroke     2013-'no residual'  . Vertigo   . Dry skin   . Constipation   . Tobacco abuse    Past Surgical History  Procedure Laterality Date  . Invasive electrophysiologic study  5/09    followed by insertion of an implantable loop recorder. S/p removal   . Colonoscopy    . Cholecystectomy    . Multiple toe surgeries    . Cardiac catheterization      LAD 30%, circumflex 50%, OM 75%, RI 60% with small branch 80%, dominant RCA 60%, EF 45-50%  . I&d extremity  12/23/2011    Procedure: IRRIGATION AND DEBRIDEMENT EXTREMITY;  Surgeon: Verlee Rossetti, MD;  Location: [facility] OR;  Service: Orthopedics;  Laterality: Left;  Left Foot  . I&d extremity  12/26/2011    Procedure: IRRIGATION AND DEBRIDEMENT EXTREMITY;  Surgeon: Toni Arthurs, MD;  Location: [facility] OR;  Service: Orthopedics;  Laterality: Left;  LEFT FOOT I&D WITH POSSIBLE WOUND VAC APPLICATION, POSSIBLE LEFT FIRST RAY AMPUTATION  . Amputation  12/30/2011    Procedure: AMPUTATION RAY;  Surgeon: Toni Arthurs, MD;  Location: [facility] OR;  Service:  Orthopedics;  Laterality: Left;  LEFT FIRST RAY AMPUTATION  . Eye surgery      cataract  . Amputation  01/13/2012    Procedure: AMPUTATION BELOW KNEE;  Surgeon: Toni Arthurs, MD;  Location: [facility] OR;  Service: Orthopedics;  Laterality: Left;  . Amputation  03/04/2012    Procedure: AMPUTATION BELOW KNEE;  Surgeon: Toni Arthurs, MD;  Location: [facility] OR;  Service: Orthopedics;  Laterality: Left;  Revision of Left Below Knee Amputation  . Tee without cardioversion N/A 07/07/2012    Procedure: TRANSESOPHAGEAL ECHOCARDIOGRAM (TEE);  Surgeon: Lewayne Bunting,  MD;  Location: [facility] ENDOSCOPY;  Service: Cardiovascular;  Laterality: N/A;   Family History  Problem Relation Age of Onset  . Pneumonia Mother   . Gallbladder disease Mother     cancer  . Heart failure Mother   . Diabetes Father   . Coronary artery disease Father   . Stroke Neg Hx    Social History:  reports that she has been smoking Cigarettes.  She has a 10 pack-year smoking history. She has never used smokeless tobacco. She reports that she drinks about 2.4 ounces of alcohol per week. She reports that she does not use illicit drugs. Allergies:  Allergies  Allergen Reactions  . Banana Other (See Comments)    Facial swelling  . Penicillins Itching     edema  . Strawberry Other (See Comments)    Facial swelling   Medications Prior to Admission  Medication Sig Dispense Refill  . acetaminophen (TYLENOL) 500 MG tablet Take 1,000 mg by mouth as needed for pain. For pain      . dipyridamole-aspirin (AGGRENOX) 200-25 MG per 12 hr capsule Take 1 capsule by mouth at bedtime.  60 capsule  2  . glimepiride (AMARYL) 4 MG tablet Take 4 mg by mouth daily.       . hydrochlorothiazide (HYDRODIURIL) 25 MG tablet Take 25 mg by mouth daily.      . insulin aspart (NOVOLOG) 100 UNIT/ML injection Inject 6 Units into the skin 3 (three) times daily before meals.       . insulin glargine (LANTUS) 100 UNIT/ML injection Inject 20 Units into the skin at bedtime.      Marland Kitchen lisinopril (PRINIVIL,ZESTRIL) 20 MG tablet Take 20 mg by mouth daily.      . metFORMIN (GLUCOPHAGE) 500 MG tablet Take 250 mg by mouth 2 (two) times daily with a meal.      . simvastatin (ZOCOR) 40 MG tablet Take 40 mg by mouth every evening.        Home: Home Living Family/patient expects to be discharged to:: Private residence Living Arrangements: Alone Available Help at Discharge: Family;Available PRN/intermittently Type of Home: House Home Access: Ramped entrance Home Layout: One level Home Equipment: Walker - 2 wheels;Shower  seat;Bedside commode;Cane - single point;Wheelchair - manual  Lives With: Alone  Functional History: Prior Function Vocation: Retired Comments: Pt was amb with rolling walker and her prosthesis. Functional Status:  Mobility: Bed Mobility Bed Mobility: Supine to Sit;Sitting - Scoot to Edge of Bed;Sit to Supine Supine to Sit: 4: Min assist;HOB elevated Sitting - Scoot to Edge of Bed: 5: Supervision Sit to Supine: 5: Supervision        ADL:    Cognition: Cognition Overall Cognitive Status: History of cognitive impairments - at baseline Arousal/Alertness: Awake/alert Orientation Level: Oriented X4 Attention: Sustained Sustained Attention: Appears intact Memory: Impaired Memory Impairment: Prospective memory;Decreased short term memory;Decreased recall of new information;Retrieval deficit Decreased Short Term Memory:  Verbal basic;Functional basic Awareness: Impaired Awareness Impairment: Anticipatory impairment;Emergent impairment;Intellectual impairment Problem Solving: Appears intact (for verbal ) Safety/Judgment: Impaired Cognition Arousal/Alertness: Awake/alert Behavior During Therapy: WFL for tasks assessed/performed Overall Cognitive Status: History of cognitive impairments - at baseline Area of Impairment: Memory;Safety/judgement Memory: Decreased short-term memory;Decreased recall of precautions Safety/Judgement: Decreased awareness of deficits;Decreased awareness of safety General Comments: Pt feels she can go home alone even though she could not come to standing with me.  Outpt PTA reports that pt with difficulty with memory.  Blood pressure 156/68, pulse 73, temperature 98.4 F (36.9 C), temperature source Oral, resp. rate 18, height 5\' 6"  (1.676 m), weight 88 kg (194 lb 0.1 oz), SpO2 100.00%. Physical Exam  Constitutional: She is oriented to person, place, and time.  HENT:  Head: Normocephalic.  Eyes: EOM are normal.  Neck: Normal range of motion. Neck supple.  No thyromegaly present.  Cardiovascular: Normal rate and regular rhythm.   Pulmonary/Chest: Effort normal and breath sounds normal. No respiratory distress.  Abdominal: Soft. Bowel sounds are normal. She exhibits no distension.  Neurological: She is alert and oriented to person, place, and time.  Follows full commands  Skin:  BKA site well healed  Psychiatric: She has a normal mood and affect.   motor strength: 4+/5 in the right deltoid, bicep, tricep, grip, hip flexor, knee extensors, ankle dyslexia plantar flexor 5/5 in the left deltoid, bicep, tricep, grip, hip flexor Sensory intact to light touch in bilateral upper and lower limb  Results for orders placed during the hospital encounter of 01/09/13 (from the past 24 hour(s))  GLUCOSE, CAPILLARY     Status: Abnormal   Collection Time    01/11/13  8:01 AM      Result Value Range   Glucose-Capillary 128 (*) 70 - 99 mg/dL  GLUCOSE, CAPILLARY     Status: Abnormal   Collection Time    01/11/13 12:11 PM      Result Value Range   Glucose-Capillary 173 (*) 70 - 99 mg/dL  GLUCOSE, CAPILLARY     Status: Abnormal   Collection Time    01/11/13  5:03 PM      Result Value Range   Glucose-Capillary 140 (*) 70 - 99 mg/dL   Comment 1 Notify RN    GLUCOSE, CAPILLARY     Status: Abnormal   Collection Time    01/11/13  9:37 PM      Result Value Range   Glucose-Capillary 169 (*) 70 - 99 mg/dL   Dg Chest 2 View  08/25/8117   *RADIOLOGY REPORT*  Clinical Data: Weakness  CHEST - 2 VIEW  Comparison: 01/09/2013  Findings: The cardiac shadow is stable.  The inspiratory effort is poor although no focal infiltrate is seen.  No acute bony abnormality is noted.  IMPRESSION: No acute abnormalities seen.   Original Report Authenticated By: Alcide Clever, M.D.   Mr Brain Wo Contrast  01/10/2013   *RADIOLOGY REPORT*  Clinical Data:  Stroke.  Right leg weakness.  Abnormal CT scan of the head.  MRI HEAD WITHOUT CONTRAST MRA HEAD WITHOUT CONTRAST  Technique:   Multiplanar, multiecho pulse sequences of the brain and surrounding structures were obtained without intravenous contrast. Angiographic images of the head were obtained using MRA technique without contrast.  Comparison:  CT head without contrast 01/09/2013.  MRI brain and MRA head 06/25/2012.  MRI HEAD  Findings:  A 12 mm acute non hemorrhagic infarct is present within the left putamen.  The CT abnormalities correspond with  chronic lacunar infarcts involving the left internal capsule and right thalamus.  These are new since the 04/22/2/14 CT, but not acute. Additional remote lacunar infarcts are present within the basal ganglia bilaterally.  A remote lacunar infarct in the left cerebellum is again noted.  Moderate periventricular white matter changes are otherwise stable.  There is T2 signal associated with the acute infarct.  The ventricles are of normal size.  Flow is present in the major intracranial arteries.  The patient is status post bilateral lens extractions.  The paranasal sinuses and mastoid air cells are clear.  IMPRESSION:  1.  Acute non hemorrhagic infarct involving the left putamen. 2.  Additional lacunar infarcts within the basal ganglia identified on the CT scan are not acute, but are new since April 2014. 3.  Remote lacunar infarcts are present in the basal ganglia bilaterally as well as the left cerebellum. 4.  Age advanced periventricular white matter changes are consistent with small vessel disease as well.  MRA HEAD  Findings: Mild atherosclerotic changes are present within the cavernous carotid arteries bilaterally.  There is marked tortuosity of the high right cervical ICA without stenosis.  The A1 and M1 segments demonstrate mild irregularity.  The anterior communicating artery is patent.  There is marked attenuation of distal MCA and ACA branch vessels bilaterally without significant proximal stenosis or occlusion.  The left vertebral artery is dominant.  There is marked attenuation of the  distal right vertebral artery.  The basilar artery demonstrates a moderate proximal stenosis. The left vertebral artery is of fetal type.  There is a high-grade stenosis of the distal aspect of the right P1 segment. A prominent right posterior communicating artery is present.  Marked distal small vessel attenuation is evident.  IMPRESSION:  1.  Advanced small vessel disease. 2.  Moderate proximal basilar artery stenosis. 3.  High-grade stenosis of the right P1 segment.  A right posterior communicating artery is present. 4.  Atherosclerotic changes of the cavernous carotid arteries bilaterally without significant stenoses. 5.  Marked tortuosity of the high cervical right ICA without stenosis.  This is most commonly seen in setting of chronic hypertension.   Original Report Authenticated By: Marin Roberts, M.D.   Mr Mra Head/brain Wo Cm  01/10/2013   *RADIOLOGY REPORT*  Clinical Data:  Stroke.  Right leg weakness.  Abnormal CT scan of the head.  MRI HEAD WITHOUT CONTRAST MRA HEAD WITHOUT CONTRAST  Technique:  Multiplanar, multiecho pulse sequences of the brain and surrounding structures were obtained without intravenous contrast. Angiographic images of the head were obtained using MRA technique without contrast.  Comparison:  CT head without contrast 01/09/2013.  MRI brain and MRA head 06/25/2012.  MRI HEAD  Findings:  A 12 mm acute non hemorrhagic infarct is present within the left putamen.  The CT abnormalities correspond with chronic lacunar infarcts involving the left internal capsule and right thalamus.  These are new since the 04/22/2/14 CT, but not acute. Additional remote lacunar infarcts are present within the basal ganglia bilaterally.  A remote lacunar infarct in the left cerebellum is again noted.  Moderate periventricular white matter changes are otherwise stable.  There is T2 signal associated with the acute infarct.  The ventricles are of normal size.  Flow is present in the major intracranial  arteries.  The patient is status post bilateral lens extractions.  The paranasal sinuses and mastoid air cells are clear.  IMPRESSION:  1.  Acute non hemorrhagic infarct involving the left putamen. 2.  Additional lacunar infarcts within the basal ganglia identified on the CT scan are not acute, but are new since April 2014. 3.  Remote lacunar infarcts are present in the basal ganglia bilaterally as well as the left cerebellum. 4.  Age advanced periventricular white matter changes are consistent with small vessel disease as well.  MRA HEAD  Findings: Mild atherosclerotic changes are present within the cavernous carotid arteries bilaterally.  There is marked tortuosity of the high right cervical ICA without stenosis.  The A1 and M1 segments demonstrate mild irregularity.  The anterior communicating artery is patent.  There is marked attenuation of distal MCA and ACA branch vessels bilaterally without significant proximal stenosis or occlusion.  The left vertebral artery is dominant.  There is marked attenuation of the distal right vertebral artery.  The basilar artery demonstrates a moderate proximal stenosis. The left vertebral artery is of fetal type.  There is a high-grade stenosis of the distal aspect of the right P1 segment. A prominent right posterior communicating artery is present.  Marked distal small vessel attenuation is evident.  IMPRESSION:  1.  Advanced small vessel disease. 2.  Moderate proximal basilar artery stenosis. 3.  High-grade stenosis of the right P1 segment.  A right posterior communicating artery is present. 4.  Atherosclerotic changes of the cavernous carotid arteries bilaterally without significant stenoses. 5.  Marked tortuosity of the high cervical right ICA without stenosis.  This is most commonly seen in setting of chronic hypertension.   Original Report Authenticated By: Marin Roberts, M.D.    Assessment/Plan: Diagnosis: Left putamen will infarct in a patient with previous  left BKA 1. Does the need for close, 24 hr/day medical supervision in concert with the patient's rehab needs make it unreasonable for this patient to be served in a less intensive setting? Yes 2. Co-Morbidities requiring supervision/potential complications: Diabetes, coronary artery disease, hypertension 3. Due to bladder management, bowel management, safety, skin/wound care, disease management, medication administration and patient education, does the patient require 24 hr/day rehab nursing? Yes 4. Does the patient require coordinated care of a physician, rehab nurse, PT (1-2 hrs/day, 5 days/week) and OT (1-2 hrs/day, 5 days/week) to address physical and functional deficits in the context of the above medical diagnosis(es)? Yes Addressing deficits in the following areas: balance, endurance, locomotion, strength, transferring, bowel/bladder control, bathing, dressing, feeding, grooming and toileting 5. Can the patient actively participate in an intensive therapy program of at least 3 hrs of therapy per day at least 5 days per week? Yes 6. The potential for patient to make measurable gains while on inpatient rehab is excellent 7. Anticipated functional outcomes upon discharge from inpatient rehab are supervision modified independent with PT, supervision to modified independent with OT, not applicable with SLP. 8. Estimated rehab length of stay to reach the above functional goals is: 10-12 days 9. Does the patient have adequate social supports to accommodate these discharge functional goals? Yes 10. Anticipated D/C setting: Home 11. Anticipated post D/C treatments: HH therapy 12. Overall Rehab/Functional Prognosis: excellent  RECOMMENDATIONS: This patient's condition is appropriate for continued rehabilitative care in the following setting: CIR Patient has agreed to participate in recommended program. Yes Note that insurance prior authorization may be required for reimbursement for recommended  care.  Comment: Ready    01/12/2013

## 2013-01-13 ENCOUNTER — Inpatient Hospital Stay (HOSPITAL_COMMUNITY)
Admission: AD | Admit: 2013-01-13 | Discharge: 2013-01-22 | DRG: 945 | Disposition: A | Discharge status: Home Health | Payer: Self-pay | Source: Intra-hospital | Attending: Physical Medicine & Rehabilitation | Admitting: Physical Medicine & Rehabilitation

## 2013-01-13 ENCOUNTER — Encounter (HOSPITAL_COMMUNITY): Payer: Self-pay | Admitting: *Deleted

## 2013-01-13 DIAGNOSIS — L98499 Non-pressure chronic ulcer of skin of other sites with unspecified severity: Secondary | ICD-10-CM

## 2013-01-13 DIAGNOSIS — S88119A Complete traumatic amputation at level between knee and ankle, unspecified lower leg, initial encounter: Secondary | ICD-10-CM

## 2013-01-13 DIAGNOSIS — G8190 Hemiplegia, unspecified affecting unspecified side: Secondary | ICD-10-CM

## 2013-01-13 DIAGNOSIS — Z8673 Personal history of transient ischemic attack (TIA), and cerebral infarction without residual deficits: Secondary | ICD-10-CM

## 2013-01-13 DIAGNOSIS — I639 Cerebral infarction, unspecified: Secondary | ICD-10-CM | POA: Diagnosis present

## 2013-01-13 DIAGNOSIS — I6789 Other cerebrovascular disease: Secondary | ICD-10-CM

## 2013-01-13 DIAGNOSIS — I739 Peripheral vascular disease, unspecified: Secondary | ICD-10-CM

## 2013-01-13 DIAGNOSIS — E785 Hyperlipidemia, unspecified: Secondary | ICD-10-CM

## 2013-01-13 DIAGNOSIS — Z794 Long term (current) use of insulin: Secondary | ICD-10-CM

## 2013-01-13 DIAGNOSIS — I651 Occlusion and stenosis of basilar artery: Secondary | ICD-10-CM

## 2013-01-13 DIAGNOSIS — E669 Obesity, unspecified: Secondary | ICD-10-CM

## 2013-01-13 DIAGNOSIS — F17200 Nicotine dependence, unspecified, uncomplicated: Secondary | ICD-10-CM

## 2013-01-13 DIAGNOSIS — G609 Hereditary and idiopathic neuropathy, unspecified: Secondary | ICD-10-CM | POA: Diagnosis present

## 2013-01-13 DIAGNOSIS — IMO0002 Reserved for concepts with insufficient information to code with codable children: Secondary | ICD-10-CM | POA: Diagnosis present

## 2013-01-13 DIAGNOSIS — I6330 Cerebral infarction due to thrombosis of unspecified cerebral artery: Secondary | ICD-10-CM

## 2013-01-13 DIAGNOSIS — Z5189 Encounter for other specified aftercare: Principal | ICD-10-CM

## 2013-01-13 DIAGNOSIS — I10 Essential (primary) hypertension: Secondary | ICD-10-CM | POA: Diagnosis present

## 2013-01-13 DIAGNOSIS — E1149 Type 2 diabetes mellitus with other diabetic neurological complication: Secondary | ICD-10-CM

## 2013-01-13 DIAGNOSIS — E1140 Type 2 diabetes mellitus with diabetic neuropathy, unspecified: Secondary | ICD-10-CM | POA: Diagnosis present

## 2013-01-13 DIAGNOSIS — I2510 Atherosclerotic heart disease of native coronary artery without angina pectoris: Secondary | ICD-10-CM

## 2013-01-13 DIAGNOSIS — Z79899 Other long term (current) drug therapy: Secondary | ICD-10-CM

## 2013-01-13 DIAGNOSIS — E1142 Type 2 diabetes mellitus with diabetic polyneuropathy: Secondary | ICD-10-CM

## 2013-01-13 LAB — GLUCOSE, CAPILLARY
Glucose-Capillary: 170 mg/dL — ABNORMAL HIGH (ref 70–99)
Glucose-Capillary: 140 mg/dL — ABNORMAL HIGH (ref 70–99)
Glucose-Capillary: 136 mg/dL — ABNORMAL HIGH (ref 70–99)

## 2013-01-13 LAB — CREATININE, SERUM
Creatinine, Ser: 0.73 mg/dL (ref 0.50–1.10)
GFR calc Af Amer: >90 mL/min (ref >90)
GFR calc non Af Amer: >90 mL/min (ref >90)

## 2013-01-13 LAB — CBC
MCHC: 33.6 g/dL (ref 30.0–36.0)
Platelets: 241 10*3/uL (ref 150–400)
RDW: 14.6 % (ref 11.5–15.5)
WBC: 8.5 10*3/uL (ref 4.0–10.5)

## 2013-01-13 MED ORDER — LISINOPRIL 20 MG PO TABS
20.0000 mg | ORAL_TABLET | Freq: Every day | ORAL | Status: DC
  Administered 2013-01-14 – 2013-01-22 (×9): 20 mg via ORAL
  Filled 2013-01-13 (×11): qty 1

## 2013-01-13 MED ORDER — SENNOSIDES-DOCUSATE SODIUM 8.6-50 MG PO TABS: 1.0000 | ORAL_TABLET | Freq: Every evening | ORAL | Status: DC | PRN

## 2013-01-13 MED ORDER — SENNOSIDES-DOCUSATE SODIUM 8.6-50 MG PO TABS
1.0000 | ORAL_TABLET | Freq: Every evening | ORAL | Status: DC | PRN
  Administered 2013-01-14 – 2013-01-21 (×4): 1 via ORAL
  Filled 2013-01-13 (×4): qty 1

## 2013-01-13 MED ORDER — INSULIN GLARGINE 100 UNIT/ML ~~LOC~~ SOLN: 25.0000 [IU] | Freq: Every day | SUBCUTANEOUS | Status: DC

## 2013-01-13 MED ORDER — ENOXAPARIN SODIUM 40 MG/0.4ML ~~LOC~~ SOLN
40.0000 mg | SUBCUTANEOUS | Status: DC
  Administered 2013-01-14 – 2013-01-22 (×9): 40 mg via SUBCUTANEOUS
  Filled 2013-01-13 (×9): qty 0.4

## 2013-01-13 MED ORDER — PHENOL 1.4 % MT LIQD
1.0000 | OROMUCOSAL | Status: DC | PRN
  Filled 2013-01-13: qty 177

## 2013-01-13 MED ORDER — METFORMIN HCL 500 MG PO TABS: 500.0000 mg | ORAL_TABLET | Freq: Two times a day (BID) | ORAL | Status: DC

## 2013-01-13 MED ORDER — ASPIRIN-DIPYRIDAMOLE ER 25-200 MG PO CP12
1.0000 | ORAL_CAPSULE | Freq: Two times a day (BID) | ORAL | Status: DC
  Administered 2013-01-13 – 2013-01-22 (×18): 1 via ORAL
  Filled 2013-01-13 (×20): qty 1

## 2013-01-13 MED ORDER — SORBITOL 70 % SOLN
30.0000 mL | Freq: Every day | Status: DC | PRN
  Administered 2013-01-20 – 2013-01-21 (×2): 30 mL via ORAL
  Filled 2013-01-13 (×2): qty 30

## 2013-01-13 MED ORDER — SIMVASTATIN 80 MG PO TABS: 80.0000 mg | ORAL_TABLET | Freq: Every evening | ORAL | Status: DC

## 2013-01-13 MED ORDER — SIMVASTATIN 40 MG PO TABS
40.0000 mg | ORAL_TABLET | Freq: Every evening | ORAL | Status: DC
  Administered 2013-01-13 – 2013-01-21 (×9): 40 mg via ORAL
  Filled 2013-01-13 (×10): qty 1

## 2013-01-13 MED ORDER — ONDANSETRON HCL 4 MG/2ML IJ SOLN: 4.0000 mg | Freq: Four times a day (QID) | INTRAMUSCULAR | Status: DC | PRN

## 2013-01-13 MED ORDER — ACETAMINOPHEN 325 MG PO TABS
650.0000 mg | ORAL_TABLET | ORAL | Status: DC | PRN
  Administered 2013-01-18 – 2013-01-20 (×4): 650 mg via ORAL
  Filled 2013-01-13 (×4): qty 2

## 2013-01-13 MED ORDER — INSULIN ASPART 100 UNIT/ML ~~LOC~~ SOLN
0.0000 [IU] | Freq: Three times a day (TID) | SUBCUTANEOUS | Status: DC
  Administered 2013-01-13 – 2013-01-14 (×2): 1 [IU] via SUBCUTANEOUS
  Administered 2013-01-14 (×2): 2 [IU] via SUBCUTANEOUS
  Administered 2013-01-15 – 2013-01-20 (×7): 1 [IU] via SUBCUTANEOUS

## 2013-01-13 MED ORDER — ASPIRIN-DIPYRIDAMOLE ER 25-200 MG PO CP12: 1.0000 | ORAL_CAPSULE | Freq: Two times a day (BID) | ORAL | Status: DC

## 2013-01-13 MED ORDER — ONDANSETRON HCL 4 MG PO TABS: 4.0000 mg | ORAL_TABLET | Freq: Four times a day (QID) | ORAL | Status: DC | PRN

## 2013-01-13 MED ORDER — PRO-STAT SUGAR FREE PO LIQD: 30.0000 mL | Freq: Every day | ORAL | Status: DC

## 2013-01-13 MED ORDER — PRO-STAT SUGAR FREE PO LIQD
30.0000 mL | Freq: Every day | ORAL | Status: DC
  Administered 2013-01-14 – 2013-01-22 (×8): 30 mL via ORAL
  Filled 2013-01-13 (×10): qty 30

## 2013-01-13 MED ORDER — ENOXAPARIN SODIUM 40 MG/0.4ML ~~LOC~~ SOLN: 40.0000 mg | SUBCUTANEOUS | Status: DC

## 2013-01-13 MED ORDER — INSULIN GLARGINE 100 UNIT/ML ~~LOC~~ SOLN
25.0000 [IU] | Freq: Every day | SUBCUTANEOUS | Status: DC
  Administered 2013-01-13 – 2013-01-21 (×9): 25 [IU] via SUBCUTANEOUS
  Filled 2013-01-13 (×10): qty 0.25

## 2013-01-13 NOTE — Discharge Summary (Signed)
Physician Discharge Summary  [name] YQM:[id number] DOB: [birth year] DOA: 01/09/2013  PCP: [name], MD  Admit date: 01/09/2013 Discharge date: 01/13/2013  Time spent: 40 minutes  Recommendations for Outpatient Follow-up:  Discharge to CIR Follow with neurology in 2 months Please folllow CPK and adjust statin dose if still elevated.  Discharge Diagnoses:  Principal Problem:   CVA (cerebral infarction)  Active Problems:   HYPERLIPIDEMIA   CAD, NATIVE VESSEL   DM type 2, uncontrolled, with neuropathy   HTN (hypertension)   Hemiparesis   Discharge Condition: fair  Diet recommendation: diabetic  Filed Weights   01/10/13 0139  Weight: 88 kg (194 lb 0.1 oz)    History of present illness:  57 y.o. female with history of diabetes, hypertension, CVA, left BKA, who usually ambulates using a wheelchair or using her prosthesis occasionally brought in after being unsteady and unable to get up. Head CT negative. W/up for CCVA showed acute left putamen infarct.   Hospital Course:  CVA with rt sided weakness  -acute left putamen infarct on MRI  -echo shows normal EF with grade 1 diastolic dysfn. Carotid shows non specific stenosis.  -Weakness and facial droop improving.  -PT recommended CIR and has been accepted. -appreciate neurology recommendation. patient on daily aggranox which has been switched to bid.  Noted for hyperlipidemia. On simvastatin 40 mg daily at home . Noted for midldly elevated CPK on admission. Have increased dose to 80 mg. Check repeat CPK, if elevated ,reduce the dose back to 40 mg.  Uncontrolled DM with A1C of 9.2  Needs aggressive risk factor modification.   Uncontrolled DM  A1C of 9.2. On amaryl, metformin and lantus 20 units daily at home. Increase lantus to 25 units and metformin to 500 mg bid.   Hypokalemia  repleted. Possibly Secondary to HCTZ use.   HTN  allow permissive BP. Continue lisinopril   Code Status:full  Family  Communication:none at bedside   Disposition Plan: CIR   Consultants:  Neuro   Procedures:  None   Antibiotics:  none     Discharge Exam: Filed Vitals:   01/13/13 0606  BP: 159/81  Pulse: 80  Temp: 98 F (36.7 C)  Resp: 18    General: Middle aged female in NAD  HEENT: no pallor, moist oral mucosa  Chest : clear b/l, nom added sounds  CVS: NS1&S2, no murmurs, rubs or gallop  Ext: left BKA  CNS: AAOx3, Rt facial droop 5/5 power over rt extremity  Discharge Instructions     Medication List         acetaminophen 500 MG tablet  Commonly known as:  TYLENOL  Take 1,000 mg by mouth as needed for pain. For pain     dipyridamole-aspirin 200-25 MG per 12 hr capsule  Commonly known as:  AGGRENOX  Take 1 capsule by mouth 2 (two) times daily.     feeding supplement Liqd  Take 30 mLs by mouth daily before breakfast.     glimepiride 4 MG tablet  Commonly known as:  AMARYL  Take 4 mg by mouth daily.     hydrochlorothiazide 25 MG tablet  Commonly known as:  HYDRODIURIL  Take 25 mg by mouth daily.     insulin aspart 100 UNIT/ML injection  Commonly known as:  novoLOG  Inject 6 Units into the skin 3 (three) times daily before meals.     insulin glargine 100 UNIT/ML injection  Commonly known as:  LANTUS  Inject 0.25 mLs (25 Units total)  into the skin at bedtime.     lisinopril 20 MG tablet  Commonly known as:  PRINIVIL,ZESTRIL  Take 20 mg by mouth daily.     metFORMIN 500 MG tablet  Commonly known as:  GLUCOPHAGE  Take 1 tablet (500 mg total) by mouth 2 (two) times daily with a meal.     senna-docusate 8.6-50 MG per tablet  Commonly known as:  Senokot-S  Take 1 tablet by mouth at bedtime as needed.     simvastatin 80 MG tablet  Commonly known as:  ZOCOR  Take 1 tablet (80 mg total) by mouth every evening.       Allergies  Allergen Reactions  . Banana Other (See Comments)    Facial swelling  . Penicillins Itching     edema  . Strawberry Other (See  Comments)    Facial swelling       Follow-up Information   Follow up with SETHI,PRAMODKUMAR P, MD. Schedule an appointment as soon as possible for a visit in 2 months. (stroke clinic)    Specialties:  Neurology, Radiology   Contact information:   [address] [place] [address] [phone]        The results of significant diagnostics from this hospitalization (including imaging, microbiology, ancillary and laboratory) are listed below for reference.    Significant Diagnostic Studies: Dg Chest 2 View  01/10/2013   *RADIOLOGY REPORT*  Clinical Data: Weakness  CHEST - 2 VIEW  Comparison: 01/09/2013  Findings: The cardiac shadow is stable.  The inspiratory effort is poor although no focal infiltrate is seen.  No acute bony abnormality is noted.  IMPRESSION: No acute abnormalities seen.   Original Report Authenticated By: Alcide Clever, M.D.   Dg Chest 2 View  01/09/2013   *RADIOLOGY REPORT*  Clinical Data: Fall.  CHEST - 2 VIEW  Comparison: 06/07/2012  Findings: Lungs are adequately inflated without consolidation or effusion.  Cardiomediastinal silhouette and remainder of the exam is unchanged.  IMPRESSION: No acute cardiopulmonary disease.   Original Report Authenticated By: Elberta Fortis, M.D.   Ct Head Wo Contrast  01/09/2013   CLINICAL DATA:  Recent falls.  Right leg pain.  EXAM: CT HEAD WITHOUT CONTRAST  TECHNIQUE: Contiguous axial images were obtained from the base of the skull through the vertex without intravenous contrast.  COMPARISON:  Head CT 09/07/2012. MRI 06/25/2012.  FINDINGS: There is no evidence of acute intracranial hemorrhage, mass lesion, brain edema or extra-axial fluid collection. There is stable mild generalized atrophy. There is new focal low density within the anterior left thalamus (image 13). Based on the conspicuity of this finding, this is not likely to be acute. There is also new low density posteriorly in the right thalamus on image 15. No cortical  infarct or hydrocephalus is identified. Intracranial vascular calcifications are noted.  The visualized paranasal sinuses, mastoid air cells and middle ears are clear. The calvarium is intact.  IMPRESSION: Interval bilateral thalamic lacunar infarcts, likely subacute. No acute intracranial findings identified.   Electronically Signed   By: Roxy Horseman   On: 01/09/2013 20:54   Mr Brain Wo Contrast  01/10/2013   *RADIOLOGY REPORT*  Clinical Data:  Stroke.  Right leg weakness.  Abnormal CT scan of the head.  MRI HEAD WITHOUT CONTRAST MRA HEAD WITHOUT CONTRAST  Technique:  Multiplanar, multiecho pulse sequences of the brain and surrounding structures were obtained without intravenous contrast. Angiographic images of the head were obtained using MRA technique without contrast.  Comparison:  CT head without contrast 01/09/2013.  MRI brain and MRA head 06/25/2012.  MRI HEAD  Findings:  A 12 mm acute non hemorrhagic infarct is present within the left putamen.  The CT abnormalities correspond with chronic lacunar infarcts involving the left internal capsule and right thalamus.  These are new since the 04/22/2/14 CT, but not acute. Additional remote lacunar infarcts are present within the basal ganglia bilaterally.  A remote lacunar infarct in the left cerebellum is again noted.  Moderate periventricular white matter changes are otherwise stable.  There is T2 signal associated with the acute infarct.  The ventricles are of normal size.  Flow is present in the major intracranial arteries.  The patient is status post bilateral lens extractions.  The paranasal sinuses and mastoid air cells are clear.  IMPRESSION:  1.  Acute non hemorrhagic infarct involving the left putamen. 2.  Additional lacunar infarcts within the basal ganglia identified on the CT scan are not acute, but are new since April 2014. 3.  Remote lacunar infarcts are present in the basal ganglia bilaterally as well as the left cerebellum. 4.  Age advanced  periventricular white matter changes are consistent with small vessel disease as well.  MRA HEAD  Findings: Mild atherosclerotic changes are present within the cavernous carotid arteries bilaterally.  There is marked tortuosity of the high right cervical ICA without stenosis.  The A1 and M1 segments demonstrate mild irregularity.  The anterior communicating artery is patent.  There is marked attenuation of distal MCA and ACA branch vessels bilaterally without significant proximal stenosis or occlusion.  The left vertebral artery is dominant.  There is marked attenuation of the distal right vertebral artery.  The basilar artery demonstrates a moderate proximal stenosis. The left vertebral artery is of fetal type.  There is a high-grade stenosis of the distal aspect of the right P1 segment. A prominent right posterior communicating artery is present.  Marked distal small vessel attenuation is evident.  IMPRESSION:  1.  Advanced small vessel disease. 2.  Moderate proximal basilar artery stenosis. 3.  High-grade stenosis of the right P1 segment.  A right posterior communicating artery is present. 4.  Atherosclerotic changes of the cavernous carotid arteries bilaterally without significant stenoses. 5.  Marked tortuosity of the high cervical right ICA without stenosis.  This is most commonly seen in setting of chronic hypertension.   Original Report Authenticated By: Marin Roberts, M.D.   Mr Mra Head/brain Wo Cm  01/10/2013   *RADIOLOGY REPORT*  Clinical Data:  Stroke.  Right leg weakness.  Abnormal CT scan of the head.  MRI HEAD WITHOUT CONTRAST MRA HEAD WITHOUT CONTRAST  Technique:  Multiplanar, multiecho pulse sequences of the brain and surrounding structures were obtained without intravenous contrast. Angiographic images of the head were obtained using MRA technique without contrast.  Comparison:  CT head without contrast 01/09/2013.  MRI brain and MRA head 06/25/2012.  MRI HEAD  Findings:  A 12 mm acute non  hemorrhagic infarct is present within the left putamen.  The CT abnormalities correspond with chronic lacunar infarcts involving the left internal capsule and right thalamus.  These are new since the 04/22/2/14 CT, but not acute. Additional remote lacunar infarcts are present within the basal ganglia bilaterally.  A remote lacunar infarct in the left cerebellum is again noted.  Moderate periventricular white matter changes are otherwise stable.  There is T2 signal associated with the acute infarct.  The ventricles are of normal size.  Flow is present in  the major intracranial arteries.  The patient is status post bilateral lens extractions.  The paranasal sinuses and mastoid air cells are clear.  IMPRESSION:  1.  Acute non hemorrhagic infarct involving the left putamen. 2.  Additional lacunar infarcts within the basal ganglia identified on the CT scan are not acute, but are new since April 2014. 3.  Remote lacunar infarcts are present in the basal ganglia bilaterally as well as the left cerebellum. 4.  Age advanced periventricular white matter changes are consistent with small vessel disease as well.  MRA HEAD  Findings: Mild atherosclerotic changes are present within the cavernous carotid arteries bilaterally.  There is marked tortuosity of the high right cervical ICA without stenosis.  The A1 and M1 segments demonstrate mild irregularity.  The anterior communicating artery is patent.  There is marked attenuation of distal MCA and ACA branch vessels bilaterally without significant proximal stenosis or occlusion.  The left vertebral artery is dominant.  There is marked attenuation of the distal right vertebral artery.  The basilar artery demonstrates a moderate proximal stenosis. The left vertebral artery is of fetal type.  There is a high-grade stenosis of the distal aspect of the right P1 segment. A prominent right posterior communicating artery is present.  Marked distal small vessel attenuation is evident.   IMPRESSION:  1.  Advanced small vessel disease. 2.  Moderate proximal basilar artery stenosis. 3.  High-grade stenosis of the right P1 segment.  A right posterior communicating artery is present. 4.  Atherosclerotic changes of the cavernous carotid arteries bilaterally without significant stenoses. 5.  Marked tortuosity of the high cervical right ICA without stenosis.  This is most commonly seen in setting of chronic hypertension.   Original Report Authenticated By: Marin Roberts, M.D.    Microbiology: No results found for this or any previous visit (from the past 240 hour(s)).   Labs: Basic Metabolic Panel:  Recent Labs Lab 01/09/13 2222 01/10/13 0942 01/10/13 1100 01/12/13 0555  NA 134* 138  --  139  K 2.9* 3.4*  --  3.2*  CL 95* 102  --  103  CO2 27 26  --  25  GLUCOSE 185* 173*  --  137*  BUN 15 9  --  10  CREATININE 0.67 0.61  --  0.71  CALCIUM 8.5 8.5  --  8.7  MG  --   --  1.7  --    Liver Function Tests:  Recent Labs Lab 01/09/13 2222  AST 23  ALT 16  ALKPHOS 121*  BILITOT 0.4  PROT 6.1  ALBUMIN 2.7*   No results found for this basename: LIPASE, AMYLASE,  in the last 168 hours No results found for this basename: AMMONIA,  in the last 168 hours CBC:  Recent Labs Lab 01/09/13 2222 01/10/13 0942 01/12/13 0555  WBC 11.5* 9.7 9.3  NEUTROABS 7.9*  --   --   HGB 11.9* 12.3 11.8*  HCT 35.2* 36.5 36.4  MCV 81.7 81.8 83.3  PLT 238 217 223   Cardiac Enzymes:  Recent Labs Lab 01/09/13 2222  CKTOTAL 838*   BNP: BNP (last 3 results) No results found for this basename: PROBNP,  in the last 8760 hours CBG:  Recent Labs Lab 01/12/13 0808 01/12/13 1152 01/12/13 1710 01/12/13 2053 01/13/13 0751  GLUCAP 137* 206* 147* 130* 121*       Signed:  Boluwatife Flight  [facility] 01/13/2013, 12:01 PM

## 2013-01-13 NOTE — H&P (Signed)
Physical Medicine and Rehabilitation Admission H&P      Chief Complaint   Patient presents with   .  Fall    : HPI: [name] is a 57 y.o. 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 Patient used rolling walker as well as a left prosthesis prior to admission. Admitted 01/10/2013 with right-sided weakness. MRI of the brain showed acute nonhemorrhagic infarct involving the left putamen. Remote lacunar infarcts in the basal ganglia bilaterally as well as left cerebellum. MRA of the head was advanced small vessel disease moderate proximal basilar artery stenosis. Echocardiogram with ejection fraction of 60% grade 1 diastolic dysfunction. Patient with TEE as well as loop recorder in the past from prior CVA that was unremarkable. Carotid Dopplers with no ICA stenosis. Patient did not receive TPA. Neurology services consulted maintained on Aggrenox therapy for CVA prophylaxis and increased to twice a day as well as subcutaneous Lovenox for DVT prophylaxis. Hemoglobin A1c of 9.2 remained on insulin therapy as directed. Physical occupational therapy evaluations completed with recommendations for physical medicine rehabilitation consult to consider inpatient rehabilitation services. Patient is felt to be a good candidate and admitted for inpatient rehabilitation services  Prior multiple CVAs Right parietal infarct 2008 Right frontal infarct 2013 Right temporal infarct February 2014 Left putamen infarct August 2014, current admission  Review of Systems   Cardiovascular:   Atypical chest pain   Gastrointestinal: Positive for constipation.   GERD   Neurological: Vertigo Remaining review of systems unremarkable Syncope       Past Medical History   Diagnosis  Date   .  Atypical chest pain         a.  non obstructive by cath in 2008 and 2010;  b. 02/2012 Myoview: non-ischemic, EF 57%   .  HTN (hypertension)     .  Hyperlipidemia     .  Syncope and collapse         a. near-syncopal episode in November 2008;  b. 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   .  CVA (cerebral infarction)         a.  Small right parietal noted incidentally 04/2007;  b. right sided embolic CVA 05/2012;  c. 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  Peripheral neuropathy     .  Obesity, unspecified     .  Esophageal reflux     .  Diabetes mellitus, type II     .  Diaphragmatic hernia without mention of obstruction or gangrene     .  Irritable bowel syndrome     .  Morbid obesity     .  Cellulitis         a. left foot-> s/p L BKA   .  Stroke         2013-'no residual'   .  Vertigo     .  Dry skin     .  Constipation     .  Tobacco abuse      Past Surgical History   Procedure  Laterality  Date   .  Invasive electrophysiologic study    5/09       followed by insertion of an implantable loop recorder. S/p removal    .  Colonoscopy       .  Cholecystectomy       .  Multiple toe surgeries       .  Cardiac catheterization           LAD 30%, circumflex 50%, OM 75%, RI 60% with small branch 80%, dominant RCA 60%, EF 45-50%   .  I&d extremity    12/23/2011       Procedure: IRRIGATION AND DEBRIDEMENT EXTREMITY;  Surgeon: Verlee Rossetti, MD;  Location: [facility] OR;  Service: Orthopedics;  Laterality: Left;  Left Foot   .  I&d extremity    12/26/2011       Procedure: IRRIGATION AND DEBRIDEMENT EXTREMITY;  Surgeon: Toni Arthurs, MD;  Location: [facility] OR;  Service: Orthopedics;  Laterality: Left;  LEFT FOOT I&D WITH POSSIBLE WOUND VAC APPLICATION, POSSIBLE LEFT FIRST RAY AMPUTATION   .  Amputation    12/30/2011       Procedure: AMPUTATION RAY;  Surgeon: Toni Arthurs, MD;  Location: [facility] OR;  Service: Orthopedics;  Laterality: Left;   LEFT FIRST RAY AMPUTATION   .  Eye surgery           cataract   .  Amputation    01/13/2012       Procedure: AMPUTATION BELOW KNEE;  Surgeon: Toni Arthurs, MD;  Location: [facility] OR;  Service: Orthopedics;  Laterality: Left;   .  Amputation    03/04/2012       Procedure: AMPUTATION BELOW KNEE;  Surgeon: Toni Arthurs, MD;  Location: [facility] OR;  Service: Orthopedics;  Laterality: Left;  Revision of Left Below Knee Amputation   .  Tee without cardioversion  N/A  07/07/2012       Procedure: TRANSESOPHAGEAL ECHOCARDIOGRAM (TEE);  Surgeon: Lewayne Bunting, MD;  Location: [facility] ENDOSCOPY;  Service: Cardiovascular;  Laterality: N/A;    Family History   Problem  Relation  Age of Onset   .  Pneumonia  Mother     .  Gallbladder disease  Mother         cancer   .  Heart failure  Mother     .  Diabetes  Father     .  Coronary artery disease  Father     .  Stroke  Neg Hx      Social History: reports that she has been smoking Cigarettes.  She has a 10 pack-year smoking history. She has never used smokeless tobacco. She reports that she drinks about 2.4 ounces of alcohol per week. She reports that she does not use illicit drugs. Allergies:  Allergies   Allergen  Reactions   .  Banana  Other (See Comments)       Facial swelling   .  Penicillins  Itching        edema   .  Strawberry  Other (See Comments)       Facial swelling    Medications Prior to Admission   Medication  Sig  Dispense  Refill   .  acetaminophen (TYLENOL) 500 MG tablet  Take 1,000 mg by mouth as needed for pain. For pain         .  dipyridamole-aspirin (AGGRENOX) 200-25 MG per 12 hr capsule  Take 1 capsule by mouth at bedtime.   60 capsule   2   .  glimepiride (AMARYL) 4 MG tablet  Take 4 mg by mouth daily.          .  hydrochlorothiazide (HYDRODIURIL) 25  MG tablet  Take 25 mg by mouth daily.         .  insulin aspart (NOVOLOG) 100 UNIT/ML injection  Inject 6 Units into the skin 3 (three) times daily before meals.          .  insulin glargine  (LANTUS) 100 UNIT/ML injection  Inject 20 Units into the skin at bedtime.         Marland Kitchen  lisinopril (PRINIVIL,ZESTRIL) 20 MG tablet  Take 20 mg by mouth daily.         .  metFORMIN (GLUCOPHAGE) 500 MG tablet  Take 250 mg by mouth 2 (two) times daily with a meal.         .  simvastatin (ZOCOR) 40 MG tablet  Take 40 mg by mouth every evening.            Home: Home Living Family/patient expects to be discharged to:: Private residence Living Arrangements: Alone Available Help at Discharge: Family;Available PRN/intermittently Type of Home: House Home Access: Ramped entrance Home Layout: One level Home Equipment: Walker - 2 wheels;Shower seat;Bedside commode;Cane - single point;Wheelchair - manual Additional Comments: niece checks in on pt daily; very involved family  Lives With: Alone    Functional History: Prior Function Vocation: Retired Comments: Pt was amb with rolling walker and her prosthesis.   Functional Status:   Mobility: Bed Mobility Bed Mobility: Supine to Sit Supine to Sit: 4: Min guard;With rails Sitting - Scoot to Edge of Bed: 4: Min guard Sit to Supine: 5: Supervision Transfers Transfers: Sit to Stand;Stand to Sit;Stand Pivot Transfers Sit to Stand: 3: Mod assist;From bed;From chair/3-in-1;With upper extremity assist;With armrests Stand to Sit: 3: Mod assist;To chair/3-in-1;With upper extremity assist;With armrests Stand Pivot Transfers: 3: Mod assist Ambulation/Gait Ambulation/Gait Assistance: Not tested (comment)   ADL: ADL Grooming: Performed;Wash/dry face;Teeth care;Set up Where Assessed - Grooming: Unsupported sitting Lower Body Dressing: Performed;Maximal assistance (don/doff sock at EOB) Where Assessed - Lower Body Dressing: Unsupported sitting Toilet Transfer: Other (comment) (deferred: need +2 A and pt does not have prosthesis) Transfers/Ambulation Related to ADLs: Bed mobility S overall including scooting up to [facility]. Sitting balance S at EOB. Transfers  not attempted due to not having +2 A and pt's prosthesis still at home. ADL Comments: Patient demonstrated decreased problem solving, decreased memory, decreased safety. Able to doff sock but unable to donn it sitting EOB despite multiple attempts and instruction on technique from therapist.   Cognition: Cognition Overall Cognitive Status: History of cognitive impairments - at baseline Arousal/Alertness: Awake/alert Orientation Level: Oriented X4 Attention: Sustained Sustained Attention: Appears intact Memory: Impaired Memory Impairment: Prospective memory;Decreased short term memory;Decreased recall of new information;Retrieval deficit Decreased Short Term Memory: Verbal basic;Functional basic Awareness: Impaired Awareness Impairment: Anticipatory impairment;Emergent impairment;Intellectual impairment Problem Solving: Appears intact (for verbal ) Safety/Judgment: Impaired Cognition Arousal/Alertness: Awake/alert Behavior During Therapy: WFL for tasks assessed/performed Overall Cognitive Status: History of cognitive impairments - at baseline Area of Impairment: Memory;Safety/judgement;Problem solving Memory: Decreased short-term memory;Decreased recall of precautions Safety/Judgement: Decreased awareness of deficits;Decreased awareness of safety Problem Solving: Decreased initiation;Difficulty sequencing;Requires verbal cues;Requires tactile cues;Slow processing General Comments: slow to respond   Physical Exam: Blood pressure 159/81, pulse 80, temperature 98 F (36.7 C), temperature source Oral, resp. rate 18, height 5\' 6"  (1.676 m), weight 88 kg (194 lb 0.1 oz), SpO2 100.00%. Constitutional: She is oriented to person, place, and time.   HENT:   Head: Normocephalic.   Eyes: EOM are normal.   Neck: Normal range of  motion. Neck supple. No thyromegaly present.   Cardiovascular: Normal rate and regular rhythm.   Pulmonary/Chest: Effort normal and breath sounds normal. No  respiratory distress.   Abdominal: Soft. Bowel sounds are normal. She exhibits no distension.  Neurological: She is alert and oriented to person, place, and time.  Follows full commands   Skin:  BKA site well healed  Psychiatric: She has a normal mood and affect.   motor strength: 4+/5 in the right deltoid, bicep, tricep, grip, hip flexor, knee extensors, ankle dyslexia plantar flexor   5/5 in the left deltoid, bicep, tricep, grip, hip flexor   Sensory intact to light touch in bilateral upper and reduced sensation to light touch right foot Responses are slowed. She feels no numbness in her foot seemed surprised when she couldn't tell which toe I was touching     Results for orders placed during the hospital encounter of 01/09/13 (from the past 48 hour(s))   GLUCOSE, CAPILLARY     Status: Abnormal     Collection Time      01/11/13  8:01 AM       Result  Value  Range     Glucose-Capillary  128 (*)  70 - 99 mg/dL   GLUCOSE, CAPILLARY     Status: Abnormal     Collection Time      01/11/13 12:11 PM       Result  Value  Range     Glucose-Capillary  173 (*)  70 - 99 mg/dL   GLUCOSE, CAPILLARY     Status: Abnormal     Collection Time      01/11/13  5:03 PM       Result  Value  Range     Glucose-Capillary  140 (*)  70 - 99 mg/dL     Comment 1  Notify RN      GLUCOSE, CAPILLARY     Status: Abnormal     Collection Time      01/11/13  9:37 PM       Result  Value  Range     Glucose-Capillary  169 (*)  70 - 99 mg/dL   CBC     Status: Abnormal     Collection Time      01/12/13  5:55 AM       Result  Value  Range     WBC  9.3   4.0 - 10.5 K/uL     RBC  4.37   3.87 - 5.11 MIL/uL     Hemoglobin  11.8 (*)  12.0 - 15.0 g/dL     HCT  16.1   09.6 - 46.0 %     MCV  83.3   78.0 - 100.0 fL     MCH  27.0   26.0 - 34.0 pg     MCHC  32.4   30.0 - 36.0 g/dL     RDW  04.5   40.9 - 15.5 %     Platelets  223   150 - 400 K/uL   BASIC METABOLIC PANEL     Status: Abnormal     Collection Time       01/12/13  5:55 AM       Result  Value  Range     Sodium  139   135 - 145 mEq/L     Potassium  3.2 (*)  3.5 - 5.1 mEq/L     Chloride  103   96 - 112 mEq/L  CO2  25   19 - 32 mEq/L     Glucose, Bld  137 (*)  70 - 99 mg/dL     BUN  10   6 - 23 mg/dL     Creatinine, Ser  1.61   0.50 - 1.10 mg/dL     Calcium  8.7   8.4 - 10.5 mg/dL     GFR calc non Af Amer  >90   >90 mL/min     GFR calc Af Amer  >90   >90 mL/min     Comment:  (NOTE)        The eGFR has been calculated using the CKD EPI equation.        This calculation has not been validated in all clinical situations.        eGFR's persistently <90 mL/min signify possible Chronic Kidney        Disease.   GLUCOSE, CAPILLARY     Status: Abnormal     Collection Time      01/12/13  8:08 AM       Result  Value  Range     Glucose-Capillary  137 (*)  70 - 99 mg/dL   GLUCOSE, CAPILLARY     Status: Abnormal     Collection Time      01/12/13 11:52 AM       Result  Value  Range     Glucose-Capillary  206 (*)  70 - 99 mg/dL   GLUCOSE, CAPILLARY     Status: Abnormal     Collection Time      01/12/13  5:10 PM       Result  Value  Range     Glucose-Capillary  147 (*)  70 - 99 mg/dL   GLUCOSE, CAPILLARY     Status: Abnormal     Collection Time      01/12/13  8:53 PM       Result  Value  Range     Glucose-Capillary  130 (*)  70 - 99 mg/dL    No results found.   Post Admission Physician Evaluation: Functional deficits secondary  to left putamen infarct in a patient with prior left BKA and multiple prior CVA's. Patient is admitted to receive collaborative, interdisciplinary care between the physiatrist, rehab nursing staff, and therapy team. Patient's level of medical complexity and substantial therapy needs in context of that medical necessity cannot be provided at a lesser intensity of care such as a SNF. Patient has experienced substantial functional loss from his/her baseline which was documented above under the "Functional History" and  "Functional Status" headings.  Judging by the patient's diagnosis, physical exam, and functional history, the patient has potential for functional progress which will result in measurable gains while on inpatient rehab.  These gains will be of substantial and practical use upon discharge  in facilitating mobility and self-care at the household level. Physiatrist will provide 24 hour management of medical needs as well as oversight of the therapy plan/treatment and provide guidance as appropriate regarding the interaction of the two. 24 hour rehab nursing will assist with bladder management, bowel management, safety, skin/wound care, disease management, medication administration, pain management and patient education  and help integrate therapy concepts, techniques,education, etc. PT will assess and treat for/with: Pre-gait training gait training neuromuscular reeducation, endurance, safety, equipment.   Goals are: Supervision level for PT. OT will assess and treat for/with: ADL, cognitive perceptual skills, neuromuscular reeducation, safety, equipment, endurance.   Goals are:  Modified independent upper body and supervision lower body ADLs. SLP will assess and treat for/with: Cognition.  Goals are: Manage medications independently. Case Management and Social Worker will assess and treat for psychological issues and discharge planning. Team conference will be held weekly to assess progress toward goals and to determine barriers to discharge. Patient will receive at least 3 hours of therapy per day at least 5 days per week. ELOS: 7-10 days        Prognosis:  excellent     Medical Problem List and Plan: 1. Left putamen infarction felt to be thrombotic /history left BKA August 2013 2. DVT Prophylaxis/Anticoagulation: Subcutaneous Lovenox. Monitor platelet counts and any signs of bleeding 3. Pain Management: Tylenol as needed 4. Neuropsych: This patient is capable of making decisions on her own  behalf. 5. Hypertension. Lisinopril 20 mg daily. Monitor with increased activity 6. Diabetes mellitus with peripheral neuropathy. Hemoglobin A1c 9.2. Lantus insulin 25 units each bedtime. Check CBGs a.c. and at bedtime 7. Hyperlipidemia. Zocor   Erick Colace M.D. [facility] FAAPM&R (Sports Med, Neuromuscular Med) Diplomate Am Board of Electrodiagnostic Med Diplomate Am Board of Pain Medicine Fellow Am Board of Interventional Pain Physicians 01/13/2013

## 2013-01-13 NOTE — Progress Notes (Signed)
Report called to [place], RN on Rehab unit, pt will go to room [phone]. Pt will transfer via bed with belongings.

## 2013-01-13 NOTE — Progress Notes (Signed)
NUTRITION FOLLOW UP  Intervention:    Continue Prosat liquid protein daily (100 kcals, 15 gm protein per dose) RD to follow for nutrition care plan  Nutrition Dx:   Unintended wt loss related to illness, poor appetite as evidenced by pt report, ongoing  Goal:   Pt to meet >/= 90% of their estimated nutrition needs, met  Monitor:   PO & supplemental intake, weight, labs, I/O's  Assessment:   Patient with hx of DM, HTN, CVA, and left BKA who usually ambulates using a wheelchair or using her prosthesis; admitted with weakness; found down at home x 3 days and was unable to get up.  Patient s/p Speech Language Pathology Evaluation 8/26.  SLP recommending therapy for cognition after discharge.  Patient reports she's eating well; PO intake good at 75% for breakfast this AM; receiving Prostat liquid protein daily (she is taking).    Plan for discharge to IP Rehab today.  Height: Ht Readings from Last 1 Encounters:  01/10/13 5\' 6"  (1.676 m)    Weight Status:   Wt Readings from Last 1 Encounters:  01/10/13 194 lb 0.1 oz (88 kg)    Re-estimated needs:  Kcal: 1600-1850 Protein: 75-85 gm Fluid: 1.6-1.8 L  Skin: Intact  Diet Order: Carb Control   Intake/Output Summary (Last 24 hours) at 01/13/13 1124 Last data filed at 01/13/13 1015  Gross per 24 hour  Intake    240 ml  Output   1250 ml  Net  -1010 ml    Labs:   Recent Labs Lab 01/09/13 2222 01/10/13 0942 01/10/13 1100 01/12/13 0555  NA 134* 138  --  139  K 2.9* 3.4*  --  3.2*  CL 95* 102  --  103  CO2 27 26  --  25  BUN 15 9  --  10  CREATININE 0.67 0.61  --  0.71  CALCIUM 8.5 8.5  --  8.7  MG  --   --  1.7  --   GLUCOSE 185* 173*  --  137*    CBG (last 3)   Recent Labs  01/12/13 1710 01/12/13 2053 01/13/13 0751  GLUCAP 147* 130* 121*    Scheduled Meds: . dipyridamole-aspirin  1 capsule Oral BID  . enoxaparin (LOVENOX) injection  40 mg Subcutaneous Q24H  . feeding supplement  30 mL Oral QAC  breakfast  . insulin aspart  0-9 Units Subcutaneous TID WC  . insulin glargine  25 Units Subcutaneous QHS  . lisinopril  20 mg Oral Daily  . simvastatin  40 mg Oral QPM    Continuous Infusions:   Maureen Chatters, RD, LDN Pager #: [phone] After-Hours Pager #: [phone]

## 2013-01-13 NOTE — Progress Notes (Signed)
I can admit pt to inpt rehab today. Patient in agreement. I will contact Dr. Gonzella Lex and arrange for today. [phone]

## 2013-01-14 ENCOUNTER — Inpatient Hospital Stay (HOSPITAL_COMMUNITY): Payer: Managed Care, Other (non HMO) | Admitting: *Deleted

## 2013-01-14 ENCOUNTER — Inpatient Hospital Stay (HOSPITAL_COMMUNITY): Payer: Managed Care, Other (non HMO) | Admitting: Occupational Therapy

## 2013-01-14 DIAGNOSIS — I739 Peripheral vascular disease, unspecified: Secondary | ICD-10-CM

## 2013-01-14 DIAGNOSIS — I6330 Cerebral infarction due to thrombosis of unspecified cerebral artery: Secondary | ICD-10-CM

## 2013-01-14 DIAGNOSIS — S88119A Complete traumatic amputation at level between knee and ankle, unspecified lower leg, initial encounter: Secondary | ICD-10-CM

## 2013-01-14 DIAGNOSIS — L98499 Non-pressure chronic ulcer of skin of other sites with unspecified severity: Secondary | ICD-10-CM

## 2013-01-14 LAB — CBC WITH DIFFERENTIAL/PLATELET
Basophils Absolute: 0 10*3/uL (ref 0.0–0.1)
Basophils Relative: 0 % (ref 0–1)
Eosinophils Absolute: 0.1 10*3/uL (ref 0.0–0.7)
Hemoglobin: 11.9 g/dL — ABNORMAL LOW (ref 12.0–15.0)
MCH: 27.7 pg (ref 26.0–34.0)
MCHC: 33.5 g/dL (ref 30.0–36.0)
Monocytes Relative: 7 % (ref 3–12)
Neutro Abs: 4.7 10*3/uL (ref 1.7–7.7)
Neutrophils Relative %: 60 % (ref 43–77)
RDW: 14.8 % (ref 11.5–15.5)

## 2013-01-14 LAB — GLUCOSE, CAPILLARY
Glucose-Capillary: 180 mg/dL — ABNORMAL HIGH (ref 70–99)
Glucose-Capillary: 137 mg/dL — ABNORMAL HIGH (ref 70–99)
Glucose-Capillary: 125 mg/dL — ABNORMAL HIGH (ref 70–99)

## 2013-01-14 LAB — COMPREHENSIVE METABOLIC PANEL
AST: 21 U/L (ref 0–37)
Albumin: 2.6 g/dL — ABNORMAL LOW (ref 3.5–5.2)
Alkaline Phosphatase: 101 U/L (ref 39–117)
BUN: 14 mg/dL (ref 6–23)
Creatinine, Ser: 0.83 mg/dL (ref 0.50–1.10)
Potassium: 3.4 mEq/L — ABNORMAL LOW (ref 3.5–5.1)
Total Protein: 5.9 g/dL — ABNORMAL LOW (ref 6.0–8.3)

## 2013-01-14 NOTE — Evaluation (Signed)
Occupational Therapy Assessment and Plan  Patient Details  Name: [name] MRN: [id number] Date of Birth: [birth year]  OT Diagnosis: cognitive deficits and muscle weakness (generalized) Rehab Potential: Rehab Potential: Excellent ELOS: 7-10 days   Today's Date: 01/14/2013 Time: 0800-0900 Time Calculation (min): 60 min  Problem List:  Patient Active Problem List   Diagnosis Date Noted  . Hemiparesis 01/10/2013  . Tobacco abuse   . Precordial pain 06/07/2012  . Chest pain at rest 02/09/2012  . Unilateral complete BKA 01/16/2012  . Chest pain 01/07/2012  . Dizziness 01/07/2012  . Hypoglycemia associated with diabetes 12/28/2011  . Diabetic ulcer of left foot 12/24/2011  . Cellulitis of left lower extremity 12/23/2011  . Paresthesia of right arm and leg 12/17/2011  . Candiduria 12/16/2011  . Facial droop 12/15/2011  . Hyperglycemia 12/15/2011  . Mild dehydration 12/15/2011  . Numbness 12/15/2011  . TIA (transient ischemic attack) 12/15/2011  . Syncope 07/02/2011  . Hypokalemia 07/02/2011  . HTN (hypertension) 07/02/2011  . UTI (lower urinary tract infection) 07/02/2011  . H/O 07/02/2011  . Normocytic anemia 07/02/2011  . PERIPHERAL NEUROPATHY 04/02/2009  . HYPERTENSION 04/02/2009  . CAD, NATIVE VESSEL 04/02/2009  . HIATAL HERNIA 04/02/2009  . Irritable bowel syndrome 04/02/2009  . HYPERLIPIDEMIA 05/30/2008  . OBESITY 05/30/2008  . LEFT VENTRICULAR FUNCTION, DECREASED 05/30/2008  . CVA (cerebral infarction) 05/30/2008  . GERD 05/30/2008  . SYNCOPE 05/30/2008  . DM type 2, uncontrolled, with neuropathy 05/30/2008    Past Medical History:  Past Medical History  Diagnosis Date  . Atypical chest pain     a. non obstructive by cath in 2008 and 2010;  b. 02/2012 Myoview: non-ischemic, EF 57%  . HTN (hypertension)   . Hyperlipidemia   . Syncope and collapse     a. near-syncopal episode in November 2008;  b. 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  . CVA (cerebral  infarction)     a.  Small right parietal noted incidentally 04/2007;  b. right sided embolic CVA 05/2012;  c. 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 Peripheral neuropathy   . Obesity, unspecified   . Esophageal reflux   . Diabetes mellitus, type II   . Diaphragmatic hernia without mention of obstruction or gangrene   . Irritable bowel syndrome   . Morbid obesity   . Cellulitis     a. 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 Stroke     2013-'no residual'  . Vertigo   . Dry skin   . Constipation   . Tobacco abuse    Past Surgical History:  Past Surgical History  Procedure Laterality Date  . Invasive electrophysiologic study  5/09    followed by insertion of an implantable loop recorder. S/p removal   . Colonoscopy    . Cholecystectomy    . Multiple toe surgeries    . Cardiac catheterization      LAD 30%, circumflex 50%, OM 75%, RI 60% with small branch 80%, dominant RCA 60%, EF 45-50%  . I&d extremity  12/23/2011    Procedure: IRRIGATION AND DEBRIDEMENT EXTREMITY;  Surgeon: Verlee Rossetti, MD;  Location: [facility] OR;  Service: Orthopedics;  Laterality: Left;  Left Foot  . I&d extremity  12/26/2011    Procedure: IRRIGATION AND DEBRIDEMENT EXTREMITY;  Surgeon: Toni Arthurs, MD;  Location: [facility] OR;  Service: Orthopedics;  Laterality: Left;  LEFT FOOT  I&D WITH POSSIBLE WOUND VAC APPLICATION, POSSIBLE LEFT FIRST RAY AMPUTATION  . Amputation  12/30/2011    Procedure: AMPUTATION RAY;  Surgeon: Toni Arthurs, MD;  Location: [facility] OR;  Service: Orthopedics;  Laterality: Left;  LEFT FIRST RAY AMPUTATION  . Eye surgery      cataract  . Amputation  01/13/2012    Procedure: AMPUTATION BELOW KNEE;  Surgeon: Toni Arthurs, MD;  Location: [facility] OR;  Service: Orthopedics;  Laterality: Left;  . Amputation  03/04/2012    Procedure: AMPUTATION BELOW KNEE;  Surgeon: Toni Arthurs, MD;  Location: [facility] OR;  Service: Orthopedics;   Laterality: Left;  Revision of Left Below Knee Amputation  . Tee without cardioversion N/A 07/07/2012    Procedure: TRANSESOPHAGEAL ECHOCARDIOGRAM (TEE);  Surgeon: Lewayne Bunting, MD;  Location: [facility] ENDOSCOPY;  Service: Cardiovascular;  Laterality: N/A;    Assessment & Plan Clinical Impression: Patient is a 57 y.o. year old female with recent admission to the hospital on 01/10/2013 with right-sided weakness. MRI of the brain showed acute nonhemorrhagic infarct involving the left putamen. Remote lacunar infarcts in the basal ganglia bilaterally as well as left cerebellum. MRA of the head was advanced small vessel disease moderate proximal basilar artery stenosis.   Patient transferred to CIR on 01/13/2013 .    Patient currently requires min with basic self-care skills and IADL secondary to muscle weakness and impaired timing and sequencing.  Prior to hospitalization, patient could complete ADLS and IADLS with modified independent .  Patient will benefit from skilled intervention to increase independence with basic self-care skills and increase level of independence with iADL prior to discharge home alone with her neice checking on her daily.  Anticipate patient will require intermittent supervision and follow up home health.  OT - End of Session Activity Tolerance: Tolerates 10 - 20 min activity with multiple rests Endurance Deficit: Yes Endurance Deficit Description: Pt had elevated HR of 106bpm after rest following walk OT Assessment Rehab Potential: Excellent Barriers to Discharge: Decreased caregiver support Barriers to Discharge Comments: Pt lives alone OT Patient demonstrates impairments in the following area(s): Balance;Cognition;Endurance;Safety OT Basic ADL's Functional Problem(s): Grooming;Bathing;Dressing;Toileting OT Advanced ADL's Functional Problem(s): Simple Meal Preparation;Laundry;Light Housekeeping OT Transfers Functional Problem(s): Toilet;Tub/Shower OT Additional  Impairment(s): None OT Plan OT Intensity: Minimum of 1-2 x/day, 45 to 90 minutes OT Frequency: 5 out of 7 days OT Duration/Estimated Length of Stay: 7-10 days OT Treatment/Interventions: Balance/vestibular training;Cognitive remediation/compensation;Community reintegration;Discharge planning;Functional mobility training;DME/adaptive equipment instruction;Neuromuscular re-education;Patient/family education;Therapeutic Activities;Self Care/advanced ADL retraining;UE/LE Strength taining/ROM;UE/LE Coordination activities OT Self Feeding Anticipated Outcome(s): indepedent OT Basic Self-Care Anticipated Outcome(s): supervision to modified indepedent OT Toileting Anticipated Outcome(s): modified independent OT Bathroom Transfers Anticipated Outcome(s): supervision to modified independent OT Recommendation Patient destination: Home Follow Up Recommendations: Home health OT Equipment Recommended: None recommended by OT   Skilled Therapeutic Intervention During OT session began education and practice on selfcare retraining, balance, safety, and functional transfers using the RW.  OT Evaluation Precautions/Restrictions  Precautions Precautions: Fall Precaution Comments: CVA, L prosthetic limb Required Braces or Orthoses: Other Brace/Splint Other Brace/Splint: LLE prosthesis Restrictions Weight Bearing Restrictions: No  Vital Signs Therapy Vitals Pulse Rate: 98 (manually) BP: 126/75 mmHg Patient Position, if appropriate: Sitting Oxygen Therapy SpO2: 100 % O2 Device: None (Room air) Pain Pain Assessment Pain Assessment: No/denies pain Home Living/Prior Functioning Home Living Living Arrangements: Alone Available Help at Discharge: Family;Available PRN/intermittently Type of Home: House Home Access: Ramped entrance Home Layout: One level Additional Comments: niece checks in on pt daily;  very involved family  Lives With: Alone IADL History Homemaking Responsibilities: Yes Meal  Prep Responsibility: Primary Laundry Responsibility: Primary Cleaning Responsibility: Primary Bill Paying/Finance Responsibility: Primary Shopping Responsibility: Primary Current License: Yes Mode of Transportation: Car Occupation: Retired Prior Function Level of Independence: Requires assistive device for independence  Able to Take Stairs?: Yes Driving: Yes Vocation: Unemployed Comments: Amb with RW or cane "Which ever is closest." Also has WC, tub bench, BSC ADL  See FIM scale for ADL levels Vision/Perception  Vision - History Baseline Vision: Wears glasses only for reading Patient Visual Report: No change from baseline Vision - Assessment Eye Alignment: Within Functional Limits Vision Assessment: Vision tested Additional Comments: occular ROM, tracking, visual fields, all WFLs with gross testing Perception Perception: Within Functional Limits Praxis Praxis: Intact  Cognition Overall Cognitive Status: No family/caregiver present to determine baseline cognitive functioning Arousal/Alertness: Awake/alert Orientation Level: Oriented X4 Attention: Sustained Sustained Attention: Appears intact Memory: Impaired Memory Impairment: Decreased long term memory Decreased Long Term Memory: Functional basic Decreased Short Term Memory: Verbal basic;Functional basic Awareness: Impaired Awareness Impairment: Anticipatory impairment Problem Solving: Impaired Problem Solving Impairment: Functional basic Safety/Judgment: Impaired Comments: Pt needing min questioning cues to sequence through bathing and dressing tasks. Sensation Sensation Light Touch: Appears Intact (for bilateral UEs) Light Touch Impaired Details: Impaired RLE Proprioception: Appears Intact Coordination Gross Motor Movements are Fluid and Coordinated: Yes (in the UEs) Fine Motor Movements are Fluid and Coordinated: Yes (in the UEs) Heel Shin Test: Impaired accurace, speed, and excursion R on L Motor   Motor Motor: Abnormal postural alignment and control Motor - Skilled Clinical Observations: Decreased coordination and speed of functional movements. Pt maintains narrow BOS and decreased weight-shifting on L side Mobility  Bed Mobility Bed Mobility: Not assessed Supine to Sit: 5: Supervision;HOB flat Sitting - Scoot to Edge of Bed: 5: Supervision Transfers Transfers: Sit to Stand Sit to Stand: 4: Min assist;With upper extremity assist;From chair/3-in-1 Sit to Stand Details (indicate cue type and reason): Steadying assist Stand to Sit: 4: Min assist;To chair/3-in-1 Stand to Sit Details: steadying assist, pt with decreased control with descent  Trunk/Postural Assessment  Cervical Assessment Cervical Assessment: Within Functional Limits Thoracic Assessment Thoracic Assessment: Within Functional Limits Lumbar Assessment Lumbar Assessment: Within Functional Limits Postural Control Postural Control: Within Functional Limits  Balance Balance Balance Assessed: Yes Static Sitting Balance Static Sitting - Balance Support: No upper extremity supported;Feet supported Static Sitting - Level of Assistance: 5: Stand by assistance Dynamic Sitting Balance Dynamic Sitting - Balance Support: No upper extremity supported Dynamic Sitting - Level of Assistance: 5: Stand by assistance Dynamic Sitting - Balance Activities: Lateral lean/weight shifting;Forward lean/weight shifting;Reaching for objects Dynamic Sitting - Comments: Hesitant to reach outside BOS, esp to L Static Standing Balance Static Standing - Balance Support: Right upper extremity supported;Left upper extremity supported Static Standing - Level of Assistance: 4: Min assist Static Standing - Comment/# of Minutes: x25min, focusing on even weight bearing through bil LEs Dynamic Standing Balance Dynamic Standing - Balance Support: Right upper extremity supported;Left upper extremity supported Dynamic Standing - Level of Assistance: 4:  Min assist (during seflcare tasks)  Extremity/Trunk Assessment RUE Assessment RUE Assessment: Within Functional Limits LUE Assessment LUE Assessment: Within Functional Limits  FIM:  FIM - Eating Eating Activity: 7: Complete independence:no helper FIM - Lower Body Dressing/Undressing Lower body dressing/undressing steps patient completed: Thread/unthread right underwear leg;Thread/unthread left underwear leg;Pull underwear up/down;Thread/unthread right pants leg;Thread/unthread left pants leg;Pull pants up/down;Fasten/unfasten right shoe;Fasten/unfasten left shoe;Don/Doff right shoe Lower body  dressing/undressing: 4: Steadying Assist FIM - Toileting Toileting steps completed by patient: Adjust clothing prior to toileting;Performs perineal hygiene;Adjust clothing after toileting Toileting: 4: Steadying assist FIM - Banker Devices: Therapist, occupational: 2: Bed > Chair or W/C: Max A (lift and lower assist);7: Independent: No helper FIM - Diplomatic Services operational officer Devices: Building control surveyor Transfers: 4-To toilet/BSC: Min A (steadying Pt. > 75%);4-From toilet/BSC: Min A (steadying Pt. > 75%) FIM - Tub/Shower Transfers Tub/shower Transfers: 0-Activity did not occur or was simulated   Refer to Care Plan for Long Term Goals  Recommendations for other services: None  Discharge Criteria: Patient will be discharged from OT if patient refuses treatment 3 consecutive times without medical reason, if treatment goals not met, if there is a change in medical status, if patient makes no progress towards goals or if patient is discharged from hospital.  The above assessment, treatment plan, treatment alternatives and goals were discussed and mutually agreed upon: by patient  Gerson Fauth OTR/L 01/14/2013, 1:22 PM

## 2013-01-14 NOTE — Progress Notes (Signed)
Nutrition Brief Note  Patient identified on the Malnutrition Screening Tool (MST) Report for weight loss. Patient has lost a little weight over the past 6 months, but weight remains in the overweight category with BMI = 29.8. Current intake is adequate to meet estimated needs. Intake of meals is excellent with 75-100% meal completion.   Wt Readings from Last 15 Encounters:  01/13/13 184 lb 4.8 oz (83.598 kg)  01/10/13 194 lb 0.1 oz (88 kg)  07/07/12 212 lb (96.163 kg)  07/07/12 212 lb (96.163 kg)  06/24/12 212 lb (96.163 kg)  06/07/12 212 lb (96.163 kg)  03/19/12 212 lb (96.163 kg)  03/04/12 212 lb (96.163 kg)  03/04/12 212 lb (96.163 kg)  03/03/12 212 lb (96.163 kg)  03/03/12 212 lb (96.163 kg)  02/18/12 212 lb (96.163 kg)  02/09/12 220 lb (99.791 kg)  01/21/12 219 lb 12.8 oz (99.7 kg)  01/08/12 215 lb 12.8 oz (97.886 kg)    Body mass index is 29.76 kg/(m^2). Patient meets criteria for overweight based on current BMI.   Current diet order is CHO-modified medium, patient is consuming approximately 75-100% of meals plus Pro-stat 30 ml daily at this time. Labs and medications reviewed.   No nutrition interventions warranted at this time. If nutrition issues arise, please consult RD.   Joaquin Courts, RD, LDN, CNSC Pager [phone] After Hours Pager [phone]

## 2013-01-14 NOTE — Progress Notes (Signed)
Occupational Therapy Note  Patient Details  Name: [name] MRN: [id number] Date of Birth: Feb 02, 1956 Today's Date: 01/14/2013  Time:1330-1400 Pain:  none Individual session   Engaged in functional mobility with RW in making jello.  Pt. Ambulated with RW to gather supplies.  Instructed pt in using counter to lean to reach into cabinet.  Pt. Did not follow throuigh with instructiion and continued to lean over walker and look.  Pt,. fatigued after 10 min and rest for few minutes.  Pt. Attempted to take pot of boiling water off stove with no potholder.  Stopped pt and asked her to stand and get potholder.  Pt. Emptied jello into water without measuring water even after cues.  She was unable to problem solve how to measure water.  Pt. Exhibited decreased problem solving, and safety awareness in kitchen with simple one item meal prep.     Humberto Seals 01/14/2013, 1:58 PM

## 2013-01-14 NOTE — Progress Notes (Signed)
Social Work Assessment and Plan Social Work Assessment and Plan  Patient Details  Name: [name] MRN: [id number] Date of Birth: July 04, 1955  Today's Date: 01/14/2013  Problem List:  Patient Active Problem List   Diagnosis Date Noted  . Hemiparesis 01/10/2013  . Tobacco abuse   . Precordial pain 06/07/2012  . Chest pain at rest 02/09/2012  . Unilateral complete BKA 01/16/2012  . Chest pain 01/07/2012  . Dizziness 01/07/2012  . Hypoglycemia associated with diabetes 12/28/2011  . Diabetic ulcer of left foot 12/24/2011  . Cellulitis of left lower extremity 12/23/2011  . Paresthesia of right arm and leg 12/17/2011  . Candiduria 12/16/2011  . Facial droop 12/15/2011  . Hyperglycemia 12/15/2011  . Mild dehydration 12/15/2011  . Numbness 12/15/2011  . TIA (transient ischemic attack) 12/15/2011  . Syncope 07/02/2011  . Hypokalemia 07/02/2011  . HTN (hypertension) 07/02/2011  . UTI (lower urinary tract infection) 07/02/2011  . H/O 07/02/2011  . Normocytic anemia 07/02/2011  . PERIPHERAL NEUROPATHY 04/02/2009  . HYPERTENSION 04/02/2009  . CAD, NATIVE VESSEL 04/02/2009  . HIATAL HERNIA 04/02/2009  . Irritable bowel syndrome 04/02/2009  . HYPERLIPIDEMIA 05/30/2008  . OBESITY 05/30/2008  . LEFT VENTRICULAR FUNCTION, DECREASED 05/30/2008  . CVA (cerebral infarction) 05/30/2008  . GERD 05/30/2008  . SYNCOPE 05/30/2008  . DM type 2, uncontrolled, with neuropathy 05/30/2008   Past Medical History:  Past Medical History  Diagnosis Date  . Atypical chest pain     a. non obstructive by cath in 2008 and 2010;  b. 02/2012 Myoview: non-ischemic, EF 57%  . HTN (hypertension)   . Hyperlipidemia   . Syncope and collapse     a. near-syncopal episode in November 2008;  b. 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  . CVA (cerebral infarction)     a.  Small right parietal noted incidentally 04/2007;  b. right sided embolic CVA 05/2012;  c. 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 Peripheral neuropathy   . Obesity, unspecified   . Esophageal reflux   . Diabetes mellitus, type II   . Diaphragmatic hernia without mention of obstruction or gangrene   . Irritable bowel syndrome   . Morbid obesity   . Cellulitis     a. 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 Stroke     2013-'no residual'  . Vertigo   . Dry skin   . Constipation   . Tobacco abuse    Past Surgical History:  Past Surgical History  Procedure Laterality Date  . Invasive electrophysiologic study  5/09    followed by insertion of an implantable loop recorder. S/p removal   . Colonoscopy    . Cholecystectomy    . Multiple toe surgeries    . Cardiac catheterization      LAD 30%, circumflex 50%, OM 75%, RI 60% with small branch 80%, dominant RCA 60%, EF 45-50%  . I&d extremity  12/23/2011    Procedure: IRRIGATION AND DEBRIDEMENT EXTREMITY;  Surgeon: Verlee Rossetti, MD;  Location: [facility] OR;  Service: Orthopedics;  Laterality: Left;  Left Foot  . I&d extremity  12/26/2011    Procedure: IRRIGATION AND DEBRIDEMENT EXTREMITY;  Surgeon: Toni Arthurs, MD;  Location: [facility] OR;  Service: Orthopedics;  Laterality: Left;  LEFT FOOT I&D WITH POSSIBLE WOUND VAC APPLICATION, POSSIBLE LEFT FIRST RAY AMPUTATION  . Amputation  12/30/2011    Procedure: AMPUTATION  RAY;  Surgeon: Toni Arthurs, MD;  Location: [facility] OR;  Service: Orthopedics;  Laterality: Left;  LEFT FIRST RAY AMPUTATION  . Eye surgery      cataract  . Amputation  01/13/2012    Procedure: AMPUTATION BELOW KNEE;  Surgeon: Toni Arthurs, MD;  Location: [facility] OR;  Service: Orthopedics;  Laterality: Left;  . Amputation  03/04/2012    Procedure: AMPUTATION BELOW KNEE;  Surgeon: Toni Arthurs, MD;  Location: [facility] OR;  Service: Orthopedics;  Laterality: Left;  Revision of Left Below Knee Amputation  . Tee without cardioversion N/A 07/07/2012    Procedure: TRANSESOPHAGEAL ECHOCARDIOGRAM (TEE);  Surgeon: Lewayne Bunting, MD;  Location: [facility] ENDOSCOPY;  Service: Cardiovascular;  Laterality: N/A;   Social History:  reports that she has been smoking Cigarettes.  She has a 10 pack-year smoking history. She has never used smokeless tobacco. She reports that she drinks about 2.4 ounces of alcohol per week. She reports that she does not use illicit drugs.  Family / Support Systems Marital Status: Single Patient Roles: Other (Comment) (Aunt and Sibling) Other Supports: Irma Newness  [phone]   Tamekia Williams-niece  [phone]  [phone] Anticipated Caregiver: Self Ability/Limitations of Caregiver: Pt will have intermittent assist, no one in the family can provide 24 hr care Caregiver Availability: Intermittent Family Dynamics: Close knit family pt has Dad , sister and numerous niece's and nephew's-all do their best to be involved and assist pt  Social History Preferred language: English Religion: Non-Denominational Cultural Background: No issues Education: Some College Read: Yes Write: Yes Employment Status: Unemployed Date Retired/Disabled/Unemployed: Receiving severance pay for 10 weeks Legal Hisotry/Current Legal Issues: No issues Guardian/Conservator: None-according to  MD pt is capable of making her own decisions   Abuse/Neglect Physical Abuse: Denies Verbal Abuse: Denies Sexual Abuse: Denies Exploitation of patient/patient's resources: Denies Self-Neglect: Denies  Emotional Status Pt's affect, behavior adn adjustment status: Pt is motivated to [employer] and become independent again.  She feels she is getting good movement back and plans to return home upon discharge.  She has always relied upon herself and feels she can again, but also grateful for her fmaily members. Recent Psychosocial Issues: Other medical issues been in& out of the hopital numeous times with different issues-CVA'S and ampuatation Pyschiatric History: No history-pt feels doing well and depression  screen is not necessary.  Concern of this worker is pt has no memory of being her last year at this time.  She reports: " I have never been on rehab beofre."  But this worker remembers her and had her while she was here. Substance Abuse History: Continues to smoke aware of the risks, also drinks but feels social.  Patient / Family Perceptions, Expectations & Goals Pt/Family understanding of illness & functional limitations: Pt can explain her stroke and deficits.  She feels she is getting good movement back and will be able to return home alone again.  Will await team's assessments. Premorbid pt/family roles/activities: Aunt, Sister, Daughter, Church member, etc Anticipated changes in roles/activities/participation: resume at discharge Pt/family expectations/goals: Pt states: " I plan to return home to when I leave here.  I will be independent."  Niece states: "We are hopeful she can do for herself, we can only check on her."  [employer]: Other (Comment) (Had in the past) Premorbid Home Care/DME Agencies: Other (Comment) (Had in the past) Transportation available at discharge: Family Resource referrals recommended: Support group (specify) (CVA & Amputee Support group)  Discharge Planning Living Arrangements: Alone Support  Systems: Other relatives;Friends/neighbors;Church/faith community Type of Residence: Private residence Insurance Resources: Medicaid (specify county) ([place] applied 01/14/2013) Financial Resources: Other (Comment) ([place] from job-needs to apply for SSD) Financial Screen Referred: Yes Living Expenses: Rent Money Management: Patient Does the patient have any problems obtaining your medications?: Yes (Describe) (May now due to no insurance) Home Management: Self-sister and niece may help Patient/Family Preliminary Plans: Return home alone with family checking in on her-daily.  She plans for this, although team's evaluations pending.  Need  to see if safe to be home alone.  Family only able to privide intermittent assist. Social Work Anticipated Follow Up Needs: HH/OP;Support Group  Clinical Impression Pleasant female who is motivated to improve, but this worker concerned that she has no memory of being here last year on rehab.  Will continue to work with on a safe discharge plan. Will have apply for SSD while here.  See if niece and sister can do more than intermittent assist.  Lucy Chris 01/14/2013, 10:29 AM

## 2013-01-14 NOTE — Care Management Note (Signed)
[facility] Individual Statement of Services  Patient Name:  [name]  Date:  01/14/2013  Welcome to the [facility].  Our goal is to provide you with an individualized program based on your diagnosis and situation, designed to meet your specific needs.  With this comprehensive rehabilitation program, you will be expected to participate in at least 3 hours of rehabilitation therapies Monday-Friday, with modified therapy programming on the weekends.  Your rehabilitation program will include the following services:  Physical Therapy (PT), Occupational Therapy (OT), Speech Therapy (ST), 24 hour per day rehabilitation nursing, Neuropsychology, Case Management (Social Worker), Rehabilitation Medicine, Nutrition Services and Pharmacy Services  Weekly team conferences will be held on Wednesday to discuss your progress.  Your Social Worker will talk with you frequently to get your input and to update you on team discussions.  Team conferences with you and your family in attendance may also be held.  Expected length of stay: 10 days Overall anticipated outcome: supervision/mod/i level  Depending on your progress and recovery, your program may change. Your Social Worker will coordinate services and will keep you informed of any changes. Your Social Worker's name and contact numbers are listed  below.  The following services may also be recommended but are not provided by the [facility]:   Driving Evaluations  Home Health Rehabiltiation Services  Outpatient Rehabilitation Services    Arrangements will be made to provide these services after discharge if needed.  Arrangements include referral to agencies that provide these services.  Your insurance has been verified to be:  Pending Medicaid Your primary doctor is:  Dr Mikeal Hawthorne  Pertinent information will be shared with your doctor and your insurance company.  Social Worker:  Dossie Der, SW [phone] or [phone]  Information discussed with and copy given to patient by: Lucy Chris, 01/14/2013, 10:11 AM

## 2013-01-14 NOTE — Evaluation (Signed)
Physical Therapy Assessment and Plan  Patient Details  Name: [name] MRN: [id number] Date of Birth: [birth year]  PT Diagnosis: Abnormality of gait, Coordination disorder, Impaired cognition, Impaired sensation and Muscle weakness Rehab Potential: Good ELOS: 10-14 days   Today's Date: 01/14/2013 Time: 1050-1150 and 14:00-14:40 ( )  Time Calculation (min): 60 min  Problem List:  Patient Active Problem List   Diagnosis Date Noted  . Hemiparesis 01/10/2013  . Tobacco abuse   . Precordial pain 06/07/2012  . Chest pain at rest 02/09/2012  . Unilateral complete BKA 01/16/2012  . Chest pain 01/07/2012  . Dizziness 01/07/2012  . Hypoglycemia associated with diabetes 12/28/2011  . Diabetic ulcer of left foot 12/24/2011  . Cellulitis of left lower extremity 12/23/2011  . Paresthesia of right arm and leg 12/17/2011  . Candiduria 12/16/2011  . Facial droop 12/15/2011  . Hyperglycemia 12/15/2011  . Mild dehydration 12/15/2011  . Numbness 12/15/2011  . TIA (transient ischemic attack) 12/15/2011  . Syncope 07/02/2011  . Hypokalemia 07/02/2011  . HTN (hypertension) 07/02/2011  . UTI (lower urinary tract infection) 07/02/2011  . H/O 07/02/2011  . Normocytic anemia 07/02/2011  . PERIPHERAL NEUROPATHY 04/02/2009  . HYPERTENSION 04/02/2009  . CAD, NATIVE VESSEL 04/02/2009  . HIATAL HERNIA 04/02/2009  . Irritable bowel syndrome 04/02/2009  . HYPERLIPIDEMIA 05/30/2008  . OBESITY 05/30/2008  . LEFT VENTRICULAR FUNCTION, DECREASED 05/30/2008  . CVA (cerebral infarction) 05/30/2008  . GERD 05/30/2008  . SYNCOPE 05/30/2008  . DM type 2, uncontrolled, with neuropathy 05/30/2008    Past Medical History:  Past Medical History  Diagnosis Date  . Atypical chest pain     a. non obstructive by cath in 2008 and 2010;  b. 02/2012 Myoview: non-ischemic, EF 57%  . HTN (hypertension)   . Hyperlipidemia   . Syncope and collapse     a. near-syncopal episode in November 2008;  b. s/p  prior ILR-> unrevealing->explanted.  . CVA (cerebral infarction)     a.  Small right parietal noted incidentally 04/2007;  b. right sided embolic CVA 05/2012;  c. 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 Peripheral neuropathy   . Obesity, unspecified   . Esophageal reflux   . Diabetes mellitus, type II   . Diaphragmatic hernia without mention of obstruction or gangrene   . Irritable bowel syndrome   . Morbid obesity   . Cellulitis     a. 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 Stroke     2013-'no residual'  . Vertigo   . Dry skin   . Constipation   . Tobacco abuse    Past Surgical History:  Past Surgical History  Procedure Laterality Date  . Invasive electrophysiologic study  5/09    followed by insertion of an implantable loop recorder. S/p removal   . Colonoscopy    . Cholecystectomy    . Multiple toe surgeries    . Cardiac catheterization      LAD 30%, circumflex 50%, OM 75%, RI 60% with small branch 80%, dominant RCA 60%, EF 45-50%  . I&d extremity  12/23/2011    Procedure: IRRIGATION AND DEBRIDEMENT EXTREMITY;  Surgeon: Verlee Rossetti, MD;  Location: [facility] OR;  Service: Orthopedics;  Laterality: Left;  Left Foot  . I&d extremity  12/26/2011    Procedure: IRRIGATION AND DEBRIDEMENT EXTREMITY;  Surgeon: Toni Arthurs, MD;  Location: [facility] OR;  Service: Orthopedics;  Laterality: Left;  LEFT FOOT I&D WITH POSSIBLE WOUND VAC APPLICATION, POSSIBLE LEFT FIRST RAY AMPUTATION  . Amputation  12/30/2011    Procedure: AMPUTATION RAY;  Surgeon: Toni Arthurs, MD;  Location: [facility] OR;  Service: Orthopedics;  Laterality: Left;  LEFT FIRST RAY AMPUTATION  . Eye surgery      cataract  . Amputation  01/13/2012    Procedure: AMPUTATION BELOW KNEE;  Surgeon: Toni Arthurs, MD;  Location: [facility] OR;  Service: Orthopedics;  Laterality: Left;  . Amputation  03/04/2012    Procedure: AMPUTATION BELOW KNEE;  Surgeon: Toni Arthurs,  MD;  Location: [facility] OR;  Service: Orthopedics;  Laterality: Left;  Revision of Left Below Knee Amputation  . Tee without cardioversion N/A 07/07/2012    Procedure: TRANSESOPHAGEAL ECHOCARDIOGRAM (TEE);  Surgeon: Lewayne Bunting, MD;  Location: [facility] ENDOSCOPY;  Service: Cardiovascular;  Laterality: N/A;    Assessment & Plan Clinical Impression: [name] is a 57 y.o. 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 Patient used rolling walker as well as a left prosthesis prior to admission. Admitted 01/10/2013 with right-sided weakness. MRI of the brain showed acute nonhemorrhagic infarct involving the left putamen. Remote lacunar infarcts in the basal ganglia bilaterally as well as left cerebellum. MRA of the head was advanced small vessel disease moderate proximal basilar artery stenosis. Echocardiogram with ejection fraction of 60% grade 1 diastolic dysfunction. Patient with TEE as well as loop recorder in the past from prior CVA that was unremarkable. Carotid Dopplers with no ICA stenosis. Patient did not receive TPA. Neurology services consulted maintained on Aggrenox therapy for CVA prophylaxis and increased to twice a day as well as subcutaneous Lovenox for DVT prophylaxis. Hemoglobin A1c of 9.2 remained on insulin therapy as directed. Physical occupational therapy evaluations completed with recommendations for physical medicine rehabilitation consult to consider inpatient rehabilitation services. Patient is felt to be a good candidate and admitted for inpatient rehabilitation services    Patient transferred to CIR on 01/13/2013 .   Patient currently requires mod with mobility secondary to muscle weakness, decreased cardiorespiratoy endurance, impaired timing and sequencing and decreased coordination, decreased  awareness, decreased problem solving, decreased safety awareness and decreased memory,   and decreased standing balance and decreased balance strategies.  Prior to hospitalization, patient was modified independent  with mobility and lived with Alone in a House home.  Home access is  Ramped entrance.  Patient will benefit from skilled PT intervention to maximize safe functional mobility, minimize fall risk and decrease caregiver burden for planned discharge home with intermittent assist.  Anticipate patient will benefit from follow up [facility] at discharge.  PT - End of Session Activity Tolerance: Tolerates < 10 min activity with changes in vital signs Endurance Deficit: Yes Endurance Deficit Description: Pt had elevated HR of 106bpm after rest following walk PT Assessment Rehab Potential: Good Barriers to Discharge: Decreased caregiver support PT Patient demonstrates impairments in the following area(s): Balance;Endurance;Motor;Safety;Sensory PT Transfers Functional Problem(s): Bed Mobility;Bed to Chair;Car;Furniture PT Locomotion Functional Problem(s): Ambulation;Wheelchair Mobility PT Plan PT Intensity: Minimum of 1-2 x/day ,45 to 90 minutes PT Frequency: 5 out of 7 days PT Duration Estimated Length of Stay: 10-14 days PT Treatment/Interventions: Warden/ranger;Ambulation/gait training;Cognitive remediation/compensation;Community reintegration;Discharge planning;Disease management/prevention;DME/adaptive equipment instruction;Functional mobility training;Neuromuscular re-education;Pain management;Patient/family education;Psychosocial support;Skin care/wound management;Splinting/orthotics;Stair training;Therapeutic Activities;Therapeutic Exercise;UE/LE Strength taining/ROM;UE/LE Coordination activities;Wheelchair propulsion/positioning PT Transfers Anticipated  Outcome(s): Modified Independent PT Locomotion Anticipated Outcome(s): Supervision PT Recommendation Recommendations for  Other Services: Neuropsych consult Follow Up Recommendations: Home health PT;24 hour supervision/assistance Patient destination: Home Equipment Recommended: None recommended by PT Equipment Details: Pt has RW, cane, WC, BSC, tub bench  Skilled Therapeutic Intervention Tx initated at eval for increased activity tolerance. Pt c/o fatigue after propelling WC x20' with bil UEs.  Performed static standing at RW during functional task x74min with 1/2 UE assist. Performed seated therex including marching, LAQ x5sec hold, and R DF/PF all x20. Despite verbal and visual cues for LAQ with 5 sec hold, pt unable to continue ex independently, persisting to hold LE in ext for 45-60 sec until cued to switch again.  Walking with RW for activity tolerance x30' with Min A in controlled environment.   Second tx focused on [facility] management, gait training with RW, transfers from various surfaces, and therex for LE strengthening. Pt up in [facility] upon arrival. Propelled WC x30' with S, limited due to fatigue.   Performed on kinetron with Min A for transfer WC<>machine and x2 seated rest breaks due to fatigue, 30cm/s with cues for maximal excursion.  HR ranging from 103-106bpm following activity, nursing made aware.   Gait with RW 3x100' with Min A and cues for safety.   Furniture transfer with mod A for lifting due to low surface. Min A sit<>stand from RW.  WC>bed with mod A for steadying during turn. Pt reports not using a RW at home for transfers in this same manner.  Sit>supine with Min A for trunk control as pt tending to lean straight back, rather than up to [facility].  Pt left in bed with alarm on.   PT Evaluation Precautions/Restrictions Precautions Precautions: Fall Precaution Comments: CVA, L prosthetic limb Required Braces or Orthoses: Other Brace/Splint Other Brace/Splint: LLE prosthesis Restrictions Weight Bearing Restrictions: No General   Vital SignsTherapy Vitals Pulse Rate: 106 (After  rest) BP: 126/75 mmHg Patient Position, if appropriate: Sitting Oxygen Therapy SpO2: 100 % O2 Device: None (Room air) Pain Pain Assessment Pain Assessment: No/denies pain Home Living/Prior Functioning Home Living Living Arrangements: Alone Available Help at Discharge: Family;Available PRN/intermittently Type of Home: House Home Access: Ramped entrance Home Layout: One level Additional Comments: niece checks in on pt daily; very involved family  Lives With: Alone Prior Function Level of Independence: Requires assistive device for independence  Able to Take Stairs?: Yes Driving: Yes Vocation: Unemployed Comments: Amb with RW or cane "Which ever is closest." Also has WC, tub bench, BSC Vision/Perception  Vision - History Baseline Vision: Wears glasses only for reading Patient Visual Report: No change from baseline Vision - Assessment Eye Alignment: Within Functional Limits Vision Assessment: Vision not tested Perception Perception: Within Functional Limits Praxis Praxis: Intact  Cognition Overall Cognitive Status: History of cognitive impairments - at baseline Arousal/Alertness: Awake/alert Orientation Level: Oriented X4 Attention: Sustained Sustained Attention: Appears intact Memory: Impaired Memory Impairment: Retrieval deficit;Decreased recall of new information;Decreased short term memory Decreased Short Term Memory: Verbal basic;Functional basic Awareness: Impaired Awareness Impairment: Anticipatory impairment Problem Solving: Impaired Problem Solving Impairment: Functional complex Safety/Judgment: Impaired Comments: Decreased awareness of deficits Sensation Sensation Light Touch: Impaired Detail Light Touch Impaired Details: Impaired RLE Proprioception: Appears Intact Coordination Gross Motor Movements are Fluid and Coordinated: No Fine Motor Movements are Fluid and Coordinated: No Heel Shin Test: Impaired accurace, speed, and excursion R on L Motor   Motor Motor: Motor impersistence Motor - Skilled Clinical Observations: Decreased coordination and speed  of functional movements. Pt maintains narrow BOS and decreased weight-shifting on L side  Mobility Bed Mobility Bed Mobility: Not assessed Transfers Transfers: Yes Sit to Stand: 4: Min assist;With upper extremity assist;With armrests;From chair/3-in-1 Sit to Stand Details (indicate cue type and reason): Steadying assist Stand to Sit: 4: Min assist Stand to Sit Details: steadying assist, pt with decreased control with descent Stand Pivot Transfers: 2: Max assist;With armrests Stand Pivot Transfer Details (indicate cue type and reason): pt able to stand with little difficulty, but unable to take turning steps without max A for weight-shifting and cues Locomotion  Ambulation Ambulation: Yes Ambulation/Gait Assistance: 4: Min assist Ambulation Distance (Feet): 20 Feet Assistive device: Rolling walker Ambulation/Gait Assistance Details: Pt needing cues for increasing BOS, and using UEs prn for steadying Gait Gait: Yes Gait Pattern: Impaired Gait Pattern: Decreased weight shift to left;Decreased step length - right;Decreased stance time - left;Left flexed knee in stance;Shuffle;Trunk flexed;Narrow base of support Gait velocity: Decreased Stairs / Additional Locomotion Stairs: Yes Stairs Assistance: 4: Min assist Stairs Assistance Details (indicate cue type and reason): Steadying assist, espcially with descent Stair Management Technique: Two rails;Step to pattern;Backwards Number of Stairs: 5 Height of Stairs: 6 Wheelchair Mobility Wheelchair Mobility: Yes Wheelchair Assistance: 5: Investment banker, operational: Both upper extremities Wheelchair Parts Management: Needs assistance Distance: 20  Trunk/Postural Assessment  Cervical Assessment Cervical Assessment: Within Functional Limits Thoracic Assessment Thoracic Assessment: Within Functional Limits Lumbar  Assessment Lumbar Assessment: Within Functional Limits Postural Control Postural Control: Within Functional Limits  Balance Balance Balance Assessed: Yes Static Sitting Balance Static Sitting - Balance Support: No upper extremity supported;Feet supported Static Sitting - Level of Assistance: 5: Stand by assistance Dynamic Sitting Balance Dynamic Sitting - Balance Support: No upper extremity supported;Feet supported Dynamic Sitting - Level of Assistance: 5: Stand by assistance Dynamic Sitting - Balance Activities: Lateral lean/weight shifting;Forward lean/weight shifting;Reaching for objects Dynamic Sitting - Comments: Hesitant to reach outside BOS, esp to L Static Standing Balance Static Standing - Balance Support: No upper extremity supported Static Standing - Level of Assistance: 4: Min assist Static Standing - Comment/# of Minutes: x47min, focusing on even weight bearing through bil LEs Dynamic Standing Balance Dynamic Standing - Balance Support: No upper extremity supported Dynamic Standing - Level of Assistance: 3: Mod assist Dynamic Standing - Balance Activities: Forward lean/weight shifting;Lateral lean/weight shifting;Reaching for objects;Reaching across midline Dynamic Standing - Comments: Decreased ability to reach outside BOS >4" with posterior LOB when reaching R, needing Mod A to recover Extremity Assessment  RUE Assessment RUE Assessment: Within Functional Limits [facility] for tasks assessed, decreased grip and shoulder flexion) LUE Assessment LUE Assessment: Within Functional Limits [facility] for tasks assessed, decreased grip and shoulder flexion) RLE Assessment RLE Assessment: Exceptions to [facility] RLE Strength RLE Overall Strength Comments: Grossly 4/5 throughout LLE Assessment LLE Assessment: Exceptions to [facility] LLE Strength LLE Overall Strength Comments: 3+/5 throughout  FIM:  FIM - Bed/Chair Transfer Bed/Chair Transfer Assistive Devices: Arm rests;Prosthesis Bed/Chair  Transfer: 2: Bed > Chair or W/C: Max A (lift and lower assist) FIM - Locomotion: Wheelchair Distance: 20 Locomotion: Wheelchair: 1: Travels less than 50 ft with supervision, cueing or coaxing FIM - Locomotion: Ambulation Locomotion: Ambulation Assistive Devices: Designer, industrial/product Ambulation/Gait Assistance: 4: Min assist Locomotion: Ambulation: 1: Travels less than 50 ft with minimal assistance (Pt.>75%) FIM - Locomotion: Stairs Locomotion: Programmer, systems Devices: Radio broadcast assistant - 2;Prothesis Locomotion: Stairs: 2: Up and Down 4 - 11 stairs with minimal assistance (Pt.>75%)   Refer to Care  Plan for Long Term Goals  Recommendations for other services: Neuropsych  Discharge Criteria: Patient will be discharged from PT if patient refuses treatment 3 consecutive times without medical reason, if treatment goals not met, if there is a change in medical status, if patient makes no progress towards goals or if patient is discharged from hospital.  The above assessment, treatment plan, treatment alternatives and goals were discussed and mutually agreed upon: by patient  Virl Cagey 01/14/2013, 12:22 PM

## 2013-01-14 NOTE — Progress Notes (Signed)
Patient ID: [name], female   DOB: [birth year], 57 y.o.   MRN: [id number] 57 y.o. 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 Patient used rolling walker as well as a left prosthesis prior to admission. Admitted 01/10/2013 with right-sided weakness. MRI of the brain showed acute nonhemorrhagic infarct involving the left putamen. Remote lacunar infarcts in the basal ganglia bilaterally as well as left cerebellum. MRA of the head was advanced small vessel disease moderate proximal basilar artery stenosis. Echocardiogram with ejection fraction of 60% grade 1 diastolic dysfunction. Patient with TEE as well as loop recorder in the past from prior CVA that was unremarkable. Carotid Dopplers with no ICA stenosis.  Subjective/Complaints: No new issues.  Slept very well. No pain c/os Review of Systems  All other systems reviewed and are negative.    Objective: Vital Signs: Blood pressure 130/81, pulse 86, temperature 98.5 F (36.9 C), temperature source Oral, resp. rate 20, height 5\' 6"  (1.676 m), weight 83.598 kg (184 lb 4.8 oz), SpO2 98.00%. No results found. Results for orders placed during the hospital encounter of 01/13/13 (from the past 72 hour(s))  GLUCOSE, CAPILLARY     Status: Abnormal   Collection Time    01/13/13  4:45 PM      Result Value Range   Glucose-Capillary 140 (*) 70 - 99 mg/dL  CBC     Status: None   Collection Time    01/13/13  6:07 PM      Result Value Range   WBC 8.5  4.0 - 10.5 K/uL   RBC 4.55  3.87 - 5.11 MIL/uL   Hemoglobin 12.6  12.0 - 15.0 g/dL   HCT 40.9  81.1 - 91.4 %   MCV 82.4  78.0 - 100.0 fL   MCH 27.7  26.0 - 34.0 pg   MCHC 33.6  30.0 - 36.0 g/dL   RDW 78.2  95.6 - 21.3 %   Platelets 241  150 - 400 K/uL  CREATININE, SERUM     Status: None   Collection Time     01/13/13  6:07 PM      Result Value Range   Creatinine, Ser 0.73  0.50 - 1.10 mg/dL   GFR calc non Af Amer >90  >90 mL/min   GFR calc Af Amer >90  >90 mL/min   Comment: (NOTE)     The eGFR has been calculated using the CKD EPI equation.     This calculation has not been validated in all clinical situations.     eGFR's persistently <90 mL/min signify possible Chronic Kidney     Disease.  GLUCOSE, CAPILLARY     Status: Abnormal   Collection Time    01/13/13  9:34 PM      Result Value Range   Glucose-Capillary 136 (*) 70 - 99 mg/dL  CBC WITH DIFFERENTIAL     Status: Abnormal   Collection Time    01/14/13  5:10 AM      Result Value Range   WBC 7.9  4.0 - 10.5 K/uL   RBC 4.30  3.87 - 5.11 MIL/uL   Hemoglobin 11.9 (*) 12.0 - 15.0 g/dL   HCT 08.6 (*) 57.8 - 46.9 %   MCV 82.6  78.0 - 100.0 fL   MCH 27.7  26.0 - 34.0 pg   MCHC 33.5  30.0 - 36.0 g/dL   RDW  14.8  11.5 - 15.5 %   Platelets 247  150 - 400 K/uL   Neutrophils Relative % 60  43 - 77 %   Neutro Abs 4.7  1.7 - 7.7 K/uL   Lymphocytes Relative 32  12 - 46 %   Lymphs Abs 2.5  0.7 - 4.0 K/uL   Monocytes Relative 7  3 - 12 %   Monocytes Absolute 0.5  0.1 - 1.0 K/uL   Eosinophils Relative 1  0 - 5 %   Eosinophils Absolute 0.1  0.0 - 0.7 K/uL   Basophils Relative 0  0 - 1 %   Basophils Absolute 0.0  0.0 - 0.1 K/uL  COMPREHENSIVE METABOLIC PANEL     Status: Abnormal   Collection Time    01/14/13  5:10 AM      Result Value Range   Sodium 138  135 - 145 mEq/L   Potassium 3.4 (*) 3.5 - 5.1 mEq/L   Chloride 103  96 - 112 mEq/L   CO2 25  19 - 32 mEq/L   Glucose, Bld 157 (*) 70 - 99 mg/dL   BUN 14  6 - 23 mg/dL   Creatinine, Ser 2.13  0.50 - 1.10 mg/dL   Calcium 8.6  8.4 - 08.6 mg/dL   Total Protein 5.9 (*) 6.0 - 8.3 g/dL   Albumin 2.6 (*) 3.5 - 5.2 g/dL   AST 21  0 - 37 U/L   ALT 19  0 - 35 U/L   Alkaline Phosphatase 101  39 - 117 U/L   Total Bilirubin 0.3  0.3 - 1.2 mg/dL   GFR calc non Af Amer 76 (*) >90 mL/min   GFR calc  Af Amer 88 (*) >90 mL/min   Comment: (NOTE)     The eGFR has been calculated using the CKD EPI equation.     This calculation has not been validated in all clinical situations.     eGFR's persistently <90 mL/min signify possible Chronic Kidney     Disease.  GLUCOSE, CAPILLARY     Status: Abnormal   Collection Time    01/14/13  7:20 AM      Result Value Range   Glucose-Capillary 180 (*) 70 - 99 mg/dL   Comment 1 Notify RN        Head: Normocephalic.  Eyes: EOM are normal.  Neck: Normal range of motion. Neck supple. No thyromegaly present.  Cardiovascular: Normal rate and regular rhythm.  Pulmonary/Chest: Effort normal and breath sounds normal. No respiratory distress.  Abdominal: Soft. Bowel sounds are normal. She exhibits no distension.  Neurological: She is alert and oriented to person, place, and time.  Follows full commands  Skin:  BKA site well healed  Psychiatric: She has a normal mood and affect.  motor strength: 4+/5 in the right deltoid, bicep, tricep, grip, hip flexor, knee extensors, ankle dyslexia plantar flexor  5/5 in the left deltoid, bicep, tricep, grip, hip flexor  Sensory intact to light touch in bilateral upper and reduced sensation to light touch right foot  Responses are slowed. She feels no numbness in her foot seemed surprised when she couldn't tell which toe I was touching      Assessment/Plan: 1. Functional deficits secondary to L putamen which require 3+ hours per day of interdisciplinary therapy in a comprehensive inpatient rehab setting. Physiatrist is providing close team supervision and 24 hour management of active medical problems listed below. Physiatrist and rehab team continue to assess barriers to discharge/monitor patient  progress toward functional and medical goals. FIM:                   Comprehension Comprehension Mode: Auditory Comprehension: 5-Understands complex 90% of the time/Cues < 10% of the  time  Expression Expression Mode: Verbal Expression: 5-Expresses complex 90% of the time/cues < 10% of the time  Social Interaction Social Interaction: 6-Interacts appropriately with others with medication or extra time (anti-anxiety, antidepressant).  Problem Solving Problem Solving: 5-Solves complex 90% of the time/cues < 10% of the time  Memory Memory: 6-More than reasonable amt of time  Medical Problem List and Plan:  1. 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 DVT Prophylaxis/Anticoagulation: Subcutaneous Lovenox. Monitor platelet counts and any signs of bleeding  3. Pain Management: Tylenol as needed  4. Neuropsych: This patient is capable of making decisions on her own behalf.  5. Hypertension. Lisinopril 20 mg daily. Monitor with increased activity  6. Diabetes mellitus with peripheral neuropathy. Hemoglobin A1c 9.2. Lantus insulin 25 units each bedtime. Check CBGs a.c. and at bedtime  7. Hyperlipidemia. Zocor   LOS (Days) 1 A FACE TO FACE EVALUATION WAS PERFORMED  Zayven Powe E 01/14/2013, 8:02 AM

## 2013-01-15 DIAGNOSIS — I6350 Cerebral infarction due to unspecified occlusion or stenosis of unspecified cerebral artery: Secondary | ICD-10-CM

## 2013-01-15 DIAGNOSIS — G8190 Hemiplegia, unspecified affecting unspecified side: Secondary | ICD-10-CM

## 2013-01-15 DIAGNOSIS — E1149 Type 2 diabetes mellitus with other diabetic neurological complication: Secondary | ICD-10-CM

## 2013-01-15 DIAGNOSIS — E1142 Type 2 diabetes mellitus with diabetic polyneuropathy: Secondary | ICD-10-CM

## 2013-01-15 LAB — GLUCOSE, CAPILLARY: Glucose-Capillary: 164 mg/dL — ABNORMAL HIGH (ref 70–99)

## 2013-01-15 NOTE — Progress Notes (Signed)
[name] is a 57 y.o. female 02-Jan-1956 [id number]  Subjective: No new complaints. No new problems. Slept well. Feeling OK - eating breakfast  Objective: Vital signs in last 24 hours: Temp:  [98.5 F (36.9 C)] 98.5 F (36.9 C) (08/30 0625) Pulse Rate:  [81-106] 81 (08/30 0625) Resp:  [16-18] 16 (08/30 0625) BP: (113-143)/(75-81) 143/81 mmHg (08/30 0625) SpO2:  [99 %-100 %] 99 % (08/30 0625) Weight change:  Last BM Date: 01/11/13  Intake/Output from previous day: 08/29 0701 - 08/30 0700 In: 700 [P.O.:700] Out: -  Last cbgs: CBG (last 3)   Recent Labs  01/14/13 1646 01/14/13 2049 01/15/13 0737  GLUCAP 137* 125* 114*     Physical Exam General: No apparent distress   Obese HEENT: moist mucosa Lungs: Normal effort. Lungs clear to auscultation, no crackles or wheezes. Cardiovascular: Regular rate and rhythm, no edema Musculoskeletal:  No change from before Neurological: No new neurological deficits Wounds: N/A    Skin: clear Alert, cooperative   Lab Results: BMET    Component Value Date/Time   NA 138 01/14/2013 0510   K 3.4* 01/14/2013 0510   CL 103 01/14/2013 0510   CO2 25 01/14/2013 0510   GLUCOSE 157* 01/14/2013 0510   BUN 14 01/14/2013 0510   CREATININE 0.83 01/14/2013 0510   CALCIUM 8.6 01/14/2013 0510   GFRNONAA 76* 01/14/2013 0510   GFRAA 88* 01/14/2013 0510   CBC    Component Value Date/Time   WBC 7.9 01/14/2013 0510   RBC 4.30 01/14/2013 0510   RBC 4.37 07/02/2011 1130   HGB 11.9* 01/14/2013 0510   HCT 35.5* 01/14/2013 0510   PLT 247 01/14/2013 0510   MCV 82.6 01/14/2013 0510   MCH 27.7 01/14/2013 0510   MCHC 33.5 01/14/2013 0510   RDW 14.8 01/14/2013 0510   LYMPHSABS 2.5 01/14/2013 0510   MONOABS 0.5 01/14/2013 0510   EOSABS 0.1 01/14/2013 0510   BASOSABS 0.0 01/14/2013 0510    Studies/Results: No results found.  Medications: I have reviewed the patient's current medications.  Assessment/Plan:  1. 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 DVT Prophylaxis/Anticoagulation: Subcutaneous Lovenox. Monitor platelet counts and any signs of bleeding  3. Pain Management: Tylenol as needed  4. Neuropsych: This patient is capable of making decisions on her own behalf.  5. Hypertension. Lisinopril 20 mg daily. Monitor with increased activity  6. Diabetes mellitus with peripheral neuropathy. Hemoglobin A1c 9.2. Lantus insulin 25 units each bedtime. Check CBGs a.c. and at bedtime  7. Hyperlipidemia. Zocor  Cont Rx    Length of stay, days: 2  Sonda Primes , MD 01/15/2013, 10:47 AM

## 2013-01-15 NOTE — IPOC Note (Signed)
Overall Plan of Care [facility]) Patient Details Name: [name] MRN: [id number] DOB: 02-21-1956  Admitting Diagnosis: L CVA OLD L BKA  Hospital Problems: Principal Problem:   CVA (cerebral infarction) Active Problems:   PERIPHERAL NEUROPATHY   HYPERTENSION   DM type 2, uncontrolled, with neuropathy   Unilateral complete BKA   Co-morbidities: See above   Functional Problem List: Nursing Bowel;Bladder;Medication Management;Pain;Safety;Endurance  PT Balance;Endurance;Motor;Safety;Sensory  OT Balance;Cognition;Endurance;Safety  SLP    TR         Basic ADL's: OT Grooming;Bathing;Dressing;Toileting     Advanced  ADL's: OT Simple Meal Preparation;Laundry;Light Housekeeping     Transfers: PT Bed Mobility;Bed to Chair;Car;Furniture  OT Toilet;Tub/Shower     Locomotion: PT Ambulation;Wheelchair Mobility     Additional Impairments: OT None  SLP        TR      Anticipated Outcomes Item Anticipated Outcome  Self Feeding indepedent  Swallowing      Basic self-care  supervision to modified indepedent  Toileting  modified independent   Bathroom Transfers supervision to modified independent  Bowel/Bladder  remain continent of bowel and bladder with timed toileting  Transfers  Modified Independent  Locomotion  Supervision  Communication     Cognition     Pain  3 or less on scale 0-10  Safety/Judgment      Therapy Plan: PT Intensity: Minimum of 1-2 x/day ,45 to 90 minutes PT Frequency: 5 out of 7 days PT Duration Estimated Length of Stay: 10-14 days OT Intensity: Minimum of 1-2 x/day, 45 to 90 minutes OT Frequency: 5 out of 7 days OT Duration/Estimated Length of Stay: 7-10 days         Team Interventions: Nursing Interventions Bowel Management;Medication Management;Skin Care/Wound Management;Patient/Family Education;Pain Management;Bladder Management  PT interventions Balance/vestibular training;Ambulation/gait training;Cognitive  remediation/compensation;Community reintegration;Discharge planning;Disease management/prevention;DME/adaptive equipment instruction;Functional mobility training;Neuromuscular re-education;Pain management;Patient/family education;Psychosocial support;Skin care/wound management;Splinting/orthotics;Stair training;Therapeutic Activities;Therapeutic Exercise;UE/LE Strength taining/ROM;UE/LE Coordination activities;Wheelchair propulsion/positioning  OT Interventions Balance/vestibular training;Cognitive remediation/compensation;Community reintegration;Discharge planning;Functional mobility training;DME/adaptive equipment instruction;Neuromuscular re-education;Patient/family education;Therapeutic Activities;Self Care/advanced ADL retraining;UE/LE Strength taining/ROM;UE/LE Coordination activities  SLP Interventions    TR Interventions    SW/CM Interventions Discharge Planning;Psychosocial Support;Patient/Family Education    Team Discharge Planning: Destination: PT-Home ,OT- Home , SLP-  Projected Follow-up: PT-Home health PT;24 hour supervision/assistance, OT-  Home health OT, SLP-  Projected Equipment Needs: PT-None recommended by PT, OT- None recommended by OT, SLP-  Patient/family involved in discharge planning: PT- Patient,  OT-Patient, SLP-   MD ELOS: 7-10day Medical Rehab Prognosis:  Good Assessment: 57 yo femal with L BKA in 2013 used prosthesis at home who was admitted with acute CVA.  Now requiring 24/7 Rehab RN,MD, as well as CIR level PT, OT and SLP.  Treatment team will focus on ADLs and mobility with goals set at Sup/Mod I level    See Team Conference Notes for weekly updates to the plan of care

## 2013-01-16 ENCOUNTER — Inpatient Hospital Stay (HOSPITAL_COMMUNITY): Payer: Managed Care, Other (non HMO)

## 2013-01-16 ENCOUNTER — Inpatient Hospital Stay (HOSPITAL_COMMUNITY): Payer: Managed Care, Other (non HMO) | Admitting: *Deleted

## 2013-01-16 DIAGNOSIS — I10 Essential (primary) hypertension: Secondary | ICD-10-CM

## 2013-01-16 LAB — GLUCOSE, CAPILLARY: Glucose-Capillary: 145 mg/dL — ABNORMAL HIGH (ref 70–99)

## 2013-01-16 NOTE — Progress Notes (Signed)
Occupational Therapy Session Note  Patient Details  Name: [name] MRN: [id number] Date of Birth: 03-Mar-1956  Today's Date: 01/16/2013 Time: 0800-0855 Time Calculation (min): 55 min  Short Term Goals: Week 1:  OT Short Term Goal 1 (Week 1): N/A secondary to ELOS  Skilled Therapeutic Interventions/Progress Updates: ADL-retraining with emphasis on tub bench transfers, motor planning, sequencing, safety awareness and problem-solving.  Prior to ADL treatment, patient reported confidence with her ability to transfer in/out of tub using standard tub and tub bench as she has at home.  However, during session patient lacked the ability to execute an effective strategy for approaching bench and completing transfer.  Patient reported that she places her right foot inside tub and performs a stand pivot transfer over edge of tub.  When she attempted her transfer she lacked UE and LE strength to initiate standing and was advised to cease to allow redirection and another method, squat with lateral scooting across to tub bench.   During dressing task, patient was unable to don her bra after snapping it in front and trying to twist bra around her waist.   Patient could not overcome the resistance of her loose skin to manipulate bra effectively but could not recognize why her attempts were unproductive.     Therapy Documentation Precautions:  Precautions Precautions: Fall Precaution Comments: CVA, L prosthetic limb Required Braces or Orthoses: Other Brace/Splint Other Brace/Splint: LLE prosthesis Restrictions Weight Bearing Restrictions: No  Pain: Pain Assessment Pain Assessment: No/denies pain  See FIM for current functional status  Therapy/Group: Individual Therapy  Second session: Time: 1300-1330 Time Calculation (min):  30 min  Pain Assessment: No report of pain  Skilled Therapeutic Interventions: Therapeutic activities with emphasis on UE strengthening addressing shoulder and elbow  weaknesses as evidenced by poor performance with bathtub transfer.  Patient completed 3 sets of 10 reps, shoulder rowing and tricep extensions.  Patient required hand guidance on performance of exercises due to confusion.  See FIM for current functional status  Therapy/Group: Individual Therapy  Georgeanne Nim 01/16/2013, 8:55 AM

## 2013-01-16 NOTE — Progress Notes (Signed)
[name] is a 57 y.o. female [birth year] [id number]  Subjective: No new complaints. No new problems. Slept well. Feeling OK - just had a therapy session...  Objective: Vital signs in last 24 hours: Temp:  [98.3 F (36.8 C)-98.5 F (36.9 C)] 98.3 F (36.8 C) (08/31 0654) Pulse Rate:  [82-87] 82 (08/31 0654) Resp:  [16-17] 16 (08/31 0654) BP: (117-140)/(74-78) 140/78 mmHg (08/31 0654) SpO2:  [96 %-99 %] 96 % (08/31 0654) Weight change:  Last BM Date: 01/11/13 (refuses laxative)  Intake/Output from previous day: 08/30 0701 - 08/31 0700 In: 720 [P.O.:720] Out: -  Last cbgs: CBG (last 3)   Recent Labs  01/15/13 1642 01/15/13 2111 01/16/13 0723  GLUCAP 130* 164* 124*     Physical Exam General: No apparent distress   Obese HEENT: moist mucosa Lungs: Normal effort. Lungs clear to auscultation, no crackles or wheezes. Cardiovascular: Regular rate and rhythm, no edema Musculoskeletal:  No change from before Neurological: No new neurological deficits Wounds: N/A    Skin: clear Alert, cooperative   Lab Results: BMET    Component Value Date/Time   NA 138 01/14/2013 0510   K 3.4* 01/14/2013 0510   CL 103 01/14/2013 0510   CO2 25 01/14/2013 0510   GLUCOSE 157* 01/14/2013 0510   BUN 14 01/14/2013 0510   CREATININE 0.83 01/14/2013 0510   CALCIUM 8.6 01/14/2013 0510   GFRNONAA 76* 01/14/2013 0510   GFRAA 88* 01/14/2013 0510   CBC    Component Value Date/Time   WBC 7.9 01/14/2013 0510   RBC 4.30 01/14/2013 0510   RBC 4.37 07/02/2011 1130   HGB 11.9* 01/14/2013 0510   HCT 35.5* 01/14/2013 0510   PLT 247 01/14/2013 0510   MCV 82.6 01/14/2013 0510   MCH 27.7 01/14/2013 0510   MCHC 33.5 01/14/2013 0510   RDW 14.8 01/14/2013 0510   LYMPHSABS 2.5 01/14/2013 0510   MONOABS 0.5 01/14/2013 0510   EOSABS 0.1 01/14/2013 0510   BASOSABS 0.0 01/14/2013 0510    Studies/Results: No results found.  Medications: I have reviewed the patient's current medications.  Assessment/Plan:  1.  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 DVT Prophylaxis/Anticoagulation: Subcutaneous Lovenox. Monitor platelet counts and any signs of bleeding  3. Pain Management: Tylenol as needed  4. Neuropsych: This patient is capable of making decisions on her own behalf.  5. Hypertension. Lisinopril 20 mg daily. Monitor with increased activity  6. Diabetes mellitus with peripheral neuropathy. Hemoglobin A1c 9.2. Lantus insulin 25 units each bedtime. Check CBGs a.c. and at bedtime  7. Hyperlipidemia. Zocor  Cont Rx as before    Length of stay, days: 3  Sonda Primes , MD 01/16/2013, 9:58 AM

## 2013-01-17 ENCOUNTER — Inpatient Hospital Stay (HOSPITAL_COMMUNITY): Payer: Managed Care, Other (non HMO) | Admitting: Occupational Therapy

## 2013-01-17 ENCOUNTER — Inpatient Hospital Stay (HOSPITAL_COMMUNITY): Payer: Managed Care, Other (non HMO)

## 2013-01-17 LAB — GLUCOSE, CAPILLARY
Glucose-Capillary: 78 mg/dL (ref 70–99)
Glucose-Capillary: 108 mg/dL — ABNORMAL HIGH (ref 70–99)
Glucose-Capillary: 165 mg/dL — ABNORMAL HIGH (ref 70–99)

## 2013-01-17 NOTE — Progress Notes (Signed)
Social Work Patient ID: [name], female   DOB: Mar 11, 1956, 57 y.o.   MRN: [id number] Met with Tamekia-niece and pt to discuss the concerns the family has regarding pt going home alone.  They fel she is not safe and needs someone there to  Assist with meals and medication administration.  Discussed pt to apply for SSD and has already applied for medicaid.  She really needs assisted living apartment. Pt continues to smoke and is aware of the health issues.  Family to continue to speak with pt and will work on alternatives for pt.  Team conference on Wed to discuss A safe discharge plan for pt.  May need Neuro-psych to see regarding competency.

## 2013-01-17 NOTE — Progress Notes (Signed)
Physical Therapy Session Note  Patient Details  Name: [name] MRN: [id number] Date of Birth: December 23, 1955  Today's Date: 01/17/2013 Time: 1107-1202 Time Calculation (min): 55 min  Short Term Goals: Week 1:  PT Short Term Goal 1 (Week 1): STG=LTG due to short LOS, Mod I transfers and WC, S gait with RW  Skilled Therapeutic Interventions/Progress Updates:   Treatment focused on neuromuscular re-education via demo, tactile and VCs for activities below  W/c propulsion using bil UEs x 75' with supervision.  Gait with RW, L prosthesis x 180'  X 150' with min guard assist; no LOB.  Pt tends to place feet ahead of RW, and does not respond to cues for technique. She exhibits R inattention by bumping obstacles on R when ambulating.  Dynamic standing balance activities with mod assist for occasional LOB backwards.    Therapy Documentation Precautions:  Precautions Precautions: Fall Precaution Comments: CVA, L prosthetic limb Required Braces or Orthoses: Other Brace/Splint Other Brace/Splint: LLE prosthesis Restrictions Weight Bearing Restrictions: No   Vital Signs: Therapy Vitals Pulse Rate: 91 Oxygen Therapy SpO2: 97 % O2 Device: None (Room air) Pain: Pain Assessment Pain Assessment: No/denies pain Pain Score: 0-No pain Patients Stated Pain Goal: 3 Multiple Pain Sites: No   Locomotion : Ambulation Ambulation/Gait Assistance: 4: Min assist Wheelchair Mobility Distance: 75  Trunk/Postural Assessment : RLE apparently longer than LLE with prosthesis   Other Treatments: Treatments Therapeutic Activity: reaching out of BOS to L and R in sitting; reaching overhead to L and R within BOS, standing on compliant Airex mat during wt shifting; sit>< stand without use of UEs Neuromuscular Facilitation: Lower Extremity;Activity to increase sustained activation;Activity to increase lateral weight shifting;Activity to increase anterior-posterior weight shifting;Upper  Extremity;Right;Activity to increase motor control;Activity to increase timing and sequencing  See FIM for current functional status  Therapy/Group: Individual Therapy  [name] 01/17/2013, 12:13 PM

## 2013-01-17 NOTE — Progress Notes (Signed)
Patient ID: [name], female   DOB: 01-01-1956, 57 y.o.   MRN: [id number] 57 y.o. 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 Patient used rolling walker as well as a left prosthesis prior to admission. Admitted 01/10/2013 with right-sided weakness. MRI of the brain showed acute nonhemorrhagic infarct involving the left putamen. Remote lacunar infarcts in the basal ganglia bilaterally as well as left cerebellum. MRA of the head was advanced small vessel disease moderate proximal basilar artery stenosis. Echocardiogram with ejection fraction of 60% grade 1 diastolic dysfunction. Patient with TEE as well as loop recorder in the past from prior CVA that was unremarkable. Carotid Dopplers with no ICA stenosis.  Subjective/Complaints: Donning prosthesis with Sup No pain C/Os  Review of Systems  All other systems reviewed and are negative.    Objective: Vital Signs: Blood pressure 152/83, pulse 83, temperature 98.3 F (36.8 C), temperature source Oral, resp. rate 18, height 5\' 6"  (1.676 m), weight 83.598 kg (184 lb 4.8 oz), SpO2 100.00%. No results found. Results for orders placed during the hospital encounter of 01/13/13 (from the past 72 hour(s))  GLUCOSE, CAPILLARY     Status: Abnormal   Collection Time    01/14/13 11:09 AM      Result Value Range   Glucose-Capillary 162 (*) 70 - 99 mg/dL   Comment 1 Notify RN    GLUCOSE, CAPILLARY     Status: Abnormal   Collection Time    01/14/13  4:46 PM      Result Value Range   Glucose-Capillary 137 (*) 70 - 99 mg/dL  GLUCOSE, CAPILLARY     Status: Abnormal   Collection Time    01/14/13  8:49 PM      Result Value Range   Glucose-Capillary 125 (*) 70 - 99 mg/dL  GLUCOSE, CAPILLARY     Status: Abnormal   Collection Time    01/15/13  7:37 AM      Result  Value Range   Glucose-Capillary 114 (*) 70 - 99 mg/dL  GLUCOSE, CAPILLARY     Status: Abnormal   Collection Time    01/15/13 12:01 PM      Result Value Range   Glucose-Capillary 111 (*) 70 - 99 mg/dL   Comment 1 Notify RN    GLUCOSE, CAPILLARY     Status: Abnormal   Collection Time    01/15/13  4:42 PM      Result Value Range   Glucose-Capillary 130 (*) 70 - 99 mg/dL   Comment 1 Notify RN    GLUCOSE, CAPILLARY     Status: Abnormal   Collection Time    01/15/13  9:11 PM      Result Value Range   Glucose-Capillary 164 (*) 70 - 99 mg/dL   Comment 1 Notify RN    GLUCOSE, CAPILLARY     Status: Abnormal   Collection Time    01/16/13  7:23 AM      Result Value Range   Glucose-Capillary 124 (*) 70 - 99 mg/dL   Comment 1 Notify RN    GLUCOSE, CAPILLARY     Status: Abnormal   Collection Time    01/16/13 11:45 AM      Result Value Range   Glucose-Capillary 145 (*) 70 - 99 mg/dL   Comment 1 Notify RN    GLUCOSE, CAPILLARY     Status: Abnormal  Collection Time    01/16/13  4:39 PM      Result Value Range   Glucose-Capillary 120 (*) 70 - 99 mg/dL   Comment 1 Notify RN    GLUCOSE, CAPILLARY     Status: Abnormal   Collection Time    01/16/13  9:48 PM      Result Value Range   Glucose-Capillary 146 (*) 70 - 99 mg/dL   Comment 1 Notify RN    GLUCOSE, CAPILLARY     Status: None   Collection Time    01/17/13  7:44 AM      Result Value Range   Glucose-Capillary 93  70 - 99 mg/dL      Head: Normocephalic.  Eyes: EOM are normal.  Neck: Normal range of motion. Neck supple. No thyromegaly present.  Cardiovascular: Normal rate and regular rhythm.  Pulmonary/Chest: Effort normal and breath sounds normal. No respiratory distress.  Abdominal: Soft. Bowel sounds are normal. She exhibits no distension.  Neurological: She is alert and oriented to person, place, and time.  Follows full commands  Skin:  BKA site well healed  Psychiatric: She has a normal mood and affect.  motor  strength: 4+/5 in the right deltoid, bicep, tricep, grip, hip flexor, knee extensors, ankle dyslexia plantar flexor  5/5 in the left deltoid, bicep, tricep, grip, hip flexor  Sensory intact to light touch in bilateral upper and reduced sensation to light touch right foot  Responses are slowed. She feels no numbness in her foot seemed surprised when she couldn't tell which toe I was touching      Assessment/Plan: 1. Functional deficits secondary to L putamen which require 3+ hours per day of interdisciplinary therapy in a comprehensive inpatient rehab setting. Physiatrist is providing close team supervision and 24 hour management of active medical problems listed below. Physiatrist and rehab team continue to assess barriers to discharge/monitor patient progress toward functional and medical goals. FIM: FIM - Bathing Bathing Steps Patient Completed: Chest;Right Arm;Left Arm;Abdomen;Front perineal area;Right upper leg;Left upper leg Bathing: 3: Mod-Patient completes 5-7 18f 10 parts or 50-74%  FIM - Upper Body Dressing/Undressing Upper body dressing/undressing steps patient completed: Thread/unthread right sleeve of pullover shirt/dresss;Thread/unthread left sleeve of pullover shirt/dress;Put head through opening of pull over shirt/dress;Pull shirt over trunk Upper body dressing/undressing: 4: Min-Patient completed 75 plus % of tasks FIM - Lower Body Dressing/Undressing Lower body dressing/undressing steps patient completed: Thread/unthread right underwear leg;Thread/unthread left underwear leg;Thread/unthread right pants leg;Thread/unthread left pants leg Lower body dressing/undressing: 3: Mod-Patient completed 50-74% of tasks  FIM - Toileting Toileting steps completed by patient: Adjust clothing prior to toileting Toileting Assistive Devices: Grab bar or rail for support Toileting: 4: Steadying assist  FIM - Diplomatic Services operational officer Devices: Forensic psychologist Transfers: 4-To toilet/BSC: Min A (steadying Pt. > 75%);4-From toilet/BSC: Min A (steadying Pt. > 75%)  FIM - Bed/Chair Transfer Bed/Chair Transfer Assistive Devices: Bed rails;Arm rests Bed/Chair Transfer: 4: Bed > Chair or W/C: Min A (steadying Pt. > 75%)  FIM - Locomotion: Wheelchair Distance: 20 Locomotion: Wheelchair: 1: Travels less than 50 ft with supervision, cueing or coaxing FIM - Locomotion: Ambulation Locomotion: Ambulation Assistive Devices: Designer, industrial/product Ambulation/Gait Assistance: 4: Min assist Locomotion: Ambulation: 1: Travels less than 50 ft with minimal assistance (Pt.>75%)  Comprehension Comprehension Mode: Auditory Comprehension: 5-Follows basic conversation/direction: With no assist  Expression Expression Mode: Verbal Expression: 5-Expresses basic needs/ideas: With no assist  Social Interaction Social Interaction: 6-Interacts appropriately with others with medication  or extra time (anti-anxiety, antidepressant).  Problem Solving Problem Solving: 5-Solves basic problems: With no assist  Memory Memory: 6-More than reasonable amt of time  Medical Problem List and Plan:  1. 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 DVT Prophylaxis/Anticoagulation: Subcutaneous Lovenox. Monitor platelet counts and any signs of bleeding  3. Pain Management: Tylenol as needed  4. Neuropsych: This patient is capable of making decisions on her own behalf.  5. Hypertension. Lisinopril 20 mg daily. Monitor with increased activity  6. Diabetes mellitus with peripheral neuropathy. Hemoglobin A1c 9.2. Lantus insulin 25 units each bedtime. Check CBGs a.c. and at bedtime  7. Hyperlipidemia. Zocor   LOS (Days) 4 A FACE TO FACE EVALUATION WAS PERFORMED  Mylinh Cragg E 01/17/2013, 9:55 AM

## 2013-01-17 NOTE — Progress Notes (Signed)
Occupational Therapy Session Note  Patient Details  Name: [name] MRN: [id number] Date of Birth: [birth year]  Today's Date: 01/17/2013 Time: 1415-1500 Time Calculation (min): 45 min  Short Term Goals: Week 1:  OT Short Term Goal 1 (Week 1): N/A secondary to ELOS  Skilled Therapeutic Interventions/Progress Updates:  Patient sleeping in bed upon arrival.  Engaged in bed mobility, bed>w/c with squat pivot, sit><stands, dynamic sitting and standing balance and kitchen cooking tasks at a w/c level per patient request.  Patient declined to don her prosthetic for this session stating that she used her w/c in her kitchen most of the time at home.  Patient practiced reaching up in high cabinet shelves by getting up on the ball of her foot as well as using a reacher.  Patient chose to cook a grilled cheese sandwich and performed this at a supervision level.  Patient propelled back to her hospital room, QRB applied and all items within reach.  Therapy Documentation Precautions:  Precautions Precautions: Fall Precaution Comments: CVA, L prosthetic limb Required Braces or Orthoses: Other Brace/Splint Other Brace/Splint: LLE prosthesis Restrictions Weight Bearing Restrictions: No Pain: Denies pain  Therapy/Group: Individual Therapy  [name] 01/17/2013, 4:43 PM

## 2013-01-17 NOTE — Progress Notes (Signed)
Occupational Therapy Session Note  Patient Details  Name: [name] MRN: [id number] Date of Birth: 07-08-1955  Today's Date: 01/17/2013 Time: 0905-1030 Time Calculation (min): 85 min  Short Term Goals: Week 1:  OT Short Term Goal 1 (Week 1): N/A secondary to ELOS  Skilled Therapeutic Interventions/Progress Updates:    Pt seen for BADL retraining of B/D at shower level with a focus on transfers from both w/c and walker level, initiation/processing, and family education with patient's niece.  Pt stated that she has to walk into her bathroom at home b/c her w/c will not fit.  She stated that she wheels her chair to the doorway and then just takes a few steps in.  She was able to walk in with steadying assist without her prosthesis. She was able to bathe herself but she needed mod cuing to bathe herself thoroughly. She did not feel comfortable coming out of the shower without her prosthesis so she did a pivot transfer to w/c with supervision.  She then completed dressing with min cues for sequencing and safety. Pt was able to don her prosthesis efficiently.  Recommended pt start donning brace, then walking to bathroom, sitting on bench and removing prosthesis to bathe. We will try this method tomorrow.  Pt then went to the ADL apartment to practice walking into bathroom with tub and get on and off tub bench 2x with only supervision and cues 2x to reach back before sitting. Discussed with pt and her niece the importance of her engaging in normal every day tasks to increase her processing skills and problem solving. Pt resting in recliner at end of session with niece in the room.    Therapy Documentation Precautions:  Precautions Precautions: Fall Precaution Comments: CVA, L prosthetic limb Required Braces or Orthoses: Other Brace/Splint Other Brace/Splint: LLE prosthesis Restrictions Weight Bearing Restrictions: No Vital Signs: Therapy Vitals Pulse Rate: 91 Oxygen Therapy SpO2: 97 % O2  Device: None (Room air) Pain: Pain Assessment Pain Assessment: No/denies pain Pain Score: 0-No pain Patients Stated Pain Goal: 3 Multiple Pain Sites: No ADL:  See FIM for current functional status  Therapy/Group: Individual Therapy  Elizet Kaplan 01/17/2013, 12:04 PM

## 2013-01-18 ENCOUNTER — Inpatient Hospital Stay (HOSPITAL_COMMUNITY): Payer: Managed Care, Other (non HMO)

## 2013-01-18 ENCOUNTER — Inpatient Hospital Stay (HOSPITAL_COMMUNITY): Payer: Managed Care, Other (non HMO) | Admitting: Occupational Therapy

## 2013-01-18 LAB — GLUCOSE, CAPILLARY
Glucose-Capillary: 88 mg/dL (ref 70–99)
Glucose-Capillary: 106 mg/dL — ABNORMAL HIGH (ref 70–99)
Glucose-Capillary: 416 mg/dL — ABNORMAL HIGH (ref 70–99)

## 2013-01-18 NOTE — Progress Notes (Signed)
Patient CBG 416. [name] Ina, PA notified. Lantus 25 units to be given as per scheduled.Patient asymptomatic. No s/s of hyperglycemia.Rechecked after giving the Lantus and notify MD. Will continue to monitor.

## 2013-01-18 NOTE — Progress Notes (Signed)
Occupational Therapy Session Note  Patient Details  Name: [name] MRN: [id number] Date of Birth: [birth year]  Today's Date: 01/18/2013 Time: 0900-1000 and 1100-1130 Time Calculation (min): 60 min and 30 min  Short Term Goals: Week 1:  OT Short Term Goal 1 (Week 1): N/A secondary to ELOS  Skilled Therapeutic Interventions/Progress Updates:    Visit 1: Pain:  No c/o pain.   Tx: Pt seen for BADL retraining of toileting, bathing, and dressing with a focus on sequencing of tasks and functional mobility.  Pt donned prosthesis from EOB and then ambulated to the shower where she doffed her clothing and prosthesis, showered, and dressed self with steadying assist.  When ambulating out of bathroom through doorway she had a LOB toward her right side and this clinician stabilized him. She seemed surprise by this. She had to urgently use toilet so she used w/c for that quick transfer. Pt resting in recliner at end of session will call light in reach.   Visit 2:  Pain: No c/o pain.  Tx: Pt seen for functional mobility training and home management training.  She ambulated in hallway with RW with close supervision. She opted to change her clothing again.  She then went to kitchen for reading directions on package of grits and processing which equipment and supplies she would need to prepare them. She was able to identify items without cues. Standing balance while hanging clothes in closet with one LOB to right into her chair. Pt was not hurt, laughed about it and got right back up.  Pt resting in w/c at end of session.  Therapy Documentation Precautions:  Precautions Precautions: Fall Precaution Comments: CVA, L prosthetic limb Required Braces or Orthoses: Other Brace/Splint Other Brace/Splint: LLE prosthesis Restrictions Weight Bearing Restrictions: No    Pain: Pain Assessment Pain Assessment: No/denies pain ADL:  See FIM for current functional status  Therapy/Group: Individual  Therapy  Soila Printup 01/18/2013, 10:47 AM

## 2013-01-18 NOTE — Progress Notes (Signed)
Patient ID: [name], female   DOB: December 31, 1955, 57 y.o.   MRN: [id number] 57 y.o. 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 Patient used rolling walker as well as a left prosthesis prior to admission. Admitted 01/10/2013 with right-sided weakness. MRI of the brain showed acute nonhemorrhagic infarct involving the left putamen. Remote lacunar infarcts in the basal ganglia bilaterally as well as left cerebellum. MRA of the head was advanced small vessel disease moderate proximal basilar artery stenosis. Echocardiogram with ejection fraction of 60% grade 1 diastolic dysfunction. Patient with TEE as well as loop recorder in the past from prior CVA that was unremarkable. Carotid Dopplers with no ICA stenosis.  Subjective/Complaints: Slept ok, no bowel or bladder issues  Review of Systems  All other systems reviewed and are negative.    Objective: Vital Signs: Blood pressure 125/70, pulse 75, temperature 98.5 F (36.9 C), temperature source Oral, resp. rate 19, height 5\' 6"  (1.676 m), weight 83.598 kg (184 lb 4.8 oz), SpO2 97.00%. No results found. Results for orders placed during the hospital encounter of 01/13/13 (from the past 72 hour(s))  GLUCOSE, CAPILLARY     Status: Abnormal   Collection Time    01/15/13 12:01 PM      Result Value Range   Glucose-Capillary 111 (*) 70 - 99 mg/dL   Comment 1 Notify RN    GLUCOSE, CAPILLARY     Status: Abnormal   Collection Time    01/15/13  4:42 PM      Result Value Range   Glucose-Capillary 130 (*) 70 - 99 mg/dL   Comment 1 Notify RN    GLUCOSE, CAPILLARY     Status: Abnormal   Collection Time    01/15/13  9:11 PM      Result Value Range   Glucose-Capillary 164 (*) 70 - 99 mg/dL   Comment 1 Notify RN    GLUCOSE, CAPILLARY     Status: Abnormal    Collection Time    01/16/13  7:23 AM      Result Value Range   Glucose-Capillary 124 (*) 70 - 99 mg/dL   Comment 1 Notify RN    GLUCOSE, CAPILLARY     Status: Abnormal   Collection Time    01/16/13 11:45 AM      Result Value Range   Glucose-Capillary 145 (*) 70 - 99 mg/dL   Comment 1 Notify RN    GLUCOSE, CAPILLARY     Status: Abnormal   Collection Time    01/16/13  4:39 PM      Result Value Range   Glucose-Capillary 120 (*) 70 - 99 mg/dL   Comment 1 Notify RN    GLUCOSE, CAPILLARY     Status: Abnormal   Collection Time    01/16/13  9:48 PM      Result Value Range   Glucose-Capillary 146 (*) 70 - 99 mg/dL   Comment 1 Notify RN    GLUCOSE, CAPILLARY     Status: None   Collection Time    01/17/13  7:44 AM      Result Value Range   Glucose-Capillary 93  70 - 99 mg/dL  GLUCOSE, CAPILLARY     Status: None   Collection Time    01/17/13 12:13 PM      Result Value Range   Glucose-Capillary 78  70 - 99 mg/dL  GLUCOSE, CAPILLARY     Status: Abnormal   Collection Time    01/17/13  4:45 PM      Result Value Range   Glucose-Capillary 108 (*) 70 - 99 mg/dL  GLUCOSE, CAPILLARY     Status: Abnormal   Collection Time    01/17/13  8:07 PM      Result Value Range   Glucose-Capillary 165 (*) 70 - 99 mg/dL      Head: Normocephalic.  Eyes: EOM are normal.  Neck: Normal range of motion. Neck supple. No thyromegaly present.  Cardiovascular: Normal rate and regular rhythm.  Pulmonary/Chest: Effort normal and breath sounds normal. No respiratory distress.  Abdominal: Soft. Bowel sounds are normal. She exhibits no distension.  Neurological: She is alert and oriented to person, place, and time.  Follows full commands  Skin:  BKA site well healed  Psychiatric: She has a normal mood and affect.  motor strength: 4+/5 in the right deltoid, bicep, tricep, grip, hip flexor, knee extensors, ankle dyslexia plantar flexor  5/5 in the left deltoid, bicep, tricep, grip, hip flexor  Sensory intact  to light touch in bilateral upper and reduced sensation to light touch right foot  Responses are slowed. She feels no numbness in her foot seemed surprised when she couldn't tell which toe I was touching      Assessment/Plan: 1. Functional deficits secondary to L putamen which require 3+ hours per day of interdisciplinary therapy in a comprehensive inpatient rehab setting. Physiatrist is providing close team supervision and 24 hour management of active medical problems listed below. Physiatrist and rehab team continue to assess barriers to discharge/monitor patient progress toward functional and medical goals. FIM: FIM - Bathing Bathing Steps Patient Completed: Chest;Right Arm;Left Arm;Abdomen;Front perineal area;Buttocks;Right lower leg (including foot);Right upper leg;Left upper leg Bathing: 5: Supervision: Safety issues/verbal cues  FIM - Upper Body Dressing/Undressing Upper body dressing/undressing steps patient completed: Thread/unthread right sleeve of pullover shirt/dresss;Thread/unthread left sleeve of pullover shirt/dress;Put head through opening of pull over shirt/dress;Pull shirt over trunk;Thread/unthread right bra strap;Thread/unthread left bra strap;Hook/unhook bra Upper body dressing/undressing: 5: Supervision: Safety issues/verbal cues FIM - Lower Body Dressing/Undressing Lower body dressing/undressing steps patient completed: Thread/unthread right underwear leg;Thread/unthread left underwear leg;Pull underwear up/down;Thread/unthread right pants leg;Thread/unthread left pants leg;Pull pants up/down;Fasten/unfasten right shoe;Don/Doff right shoe (dons left prosthesis without assist) Lower body dressing/undressing: 4: Steadying Assist  FIM - Toileting Toileting steps completed by patient: Adjust clothing prior to toileting;Performs perineal hygiene;Adjust clothing after toileting Toileting Assistive Devices: Grab bar or rail for support Toileting: 4: Steadying assist  FIM -  Diplomatic Services operational officer Devices: Building control surveyor Transfers: 4-From toilet/BSC: Min A (steadying Pt. > 75%)  FIM - Bed/Chair Transfer Bed/Chair Transfer Assistive Devices: Bed rails;Arm rests Bed/Chair Transfer: 5: Bed > Chair or W/C: Supervision (verbal cues/safety issues);5: Chair or W/C > Bed: Supervision (verbal cues/safety issues)  FIM - Locomotion: Wheelchair Distance: 75 Locomotion: Wheelchair: 2: Travels 50 - 149 ft with supervision, cueing or coaxing FIM - Locomotion: Ambulation Locomotion: Ambulation Assistive Devices: Designer, industrial/product Ambulation/Gait Assistance: 4: Min assist Locomotion: Ambulation: 4: Travels 150 ft or more with minimal assistance (Pt.>75%)  Comprehension Comprehension Mode: Auditory Comprehension: 5-Follows basic conversation/direction: With extra time/assistive device  Expression Expression Mode: Verbal Expression: 5-Expresses basic needs/ideas: With extra time/assistive device  Social Interaction Social Interaction: 6-Interacts appropriately with others with medication or extra time (anti-anxiety, antidepressant).  Problem Solving Problem Solving: 5-Solves basic problems: With no assist  Memory Memory: 6-More than reasonable  amt of time  Medical Problem List and Plan:  1. 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 DVT Prophylaxis/Anticoagulation: Subcutaneous Lovenox. Monitor platelet counts and any signs of bleeding  3. Pain Management: Tylenol as needed  4. Neuropsych: This patient is capable of making decisions on her own behalf.  5. Hypertension. Lisinopril 20 mg daily. Monitor with increased activity  6. Diabetes mellitus with peripheral neuropathy. Controlled Hemoglobin A1c 9.2. Lantus insulin 25 units each bedtime. Check CBGs a.c. and at bedtime  7. Hyperlipidemia. Zocor   LOS (Days) 5 A FACE TO FACE EVALUATION WAS PERFORMED  Ishana Blades E 01/18/2013, 8:16 AM

## 2013-01-18 NOTE — Progress Notes (Signed)
Physical Therapy Session Note  Patient Details  Name: [name] MRN: [id number] Date of Birth: February 04, 1956  Today's Date: 01/18/2013 Time: 8657-8469  And 6295-2841 Time Calculation (min): 60 min and 44 min  Short Term Goals: Week 1:  PT Short Term Goal 1 (Week 1): STG=LTG due to short LOS, Mod I transfers and WC, S gait with RW  Skilled Therapeutic Interventions/Progress Updates:   Treatment 1:  Pt in bed at start of session.  Supine > sit using bed rail with cues to complete movement, supervision.  Pt sat bedside to donn L prosthesis.  She attempted x 2 without success fitting bolt into socket.  When cued that bolt was angled incorrectly, she stated, "oh I just keep taking it off and trying again until I get it right".  Pt later stated that if sometimes takes 3 tries to don it at home.  neuromuscular re-education via demo, VCs for warm up exs pre gait: marching, knee extension with isometric hold at end range, resisted hip abduction, all in sitting  Gait with RW x 175' with cues for upright posture, forward gaze.  When instructed and cued to shorten R step so that foot does not extend past RW, pt was able to correct for about 3 steps, then reverted back to former pattern, which seems to be an old habit.  Up/down 5 steps with 2 rails, min guard assist; pt remembered sequence without cues.  Pt reported that she used a SPC to ascend/descend her 3 steps without rails at home.  Ex for activity tolerance, generalized strengthening on NuStep at level 4, x  6 Min.  Starting HR 86; ex HR 96.  NuStep> w/c stand pivot transfer attempted without RW; pt had LOB posteriorly and required mod assist to prevent fall.  W/c mobility using bil hands, veering R continually due to RUE weakness.  Pt does not initiate self correction.  She reported she did use the w/c in her home frequently PTA.  Treatment 2:  Pt received lying supine in flat bed, diagonally positioned.  Pt state her L distal residual  limb hurt.  Eventually she stated that she got herself in bed from w/c without asking for assistance.  RN made aware.    Pt performed bil LE, trunk and UEs for isolated movements for strengthening and motor control.  Rolling L and R with supervision.  R sidelying to sit with S, extra time, one tactile cue.  Pt stated her LE was no longer painful after Tylenol which she took at start of session.  Pt donned prosthesis with 2 tries and min assistance for don straight so that bolt will drop into socket.  Gait in room to recliner with RW.  Pt left with all needs within reach, bil LEs elevated; safety belt in place.    Therapy Documentation Precautions:  Precautions Precautions: Fall Precaution Comments: CVA, L prosthetic limb Required Braces or Orthoses: Other Brace/Splint Other Brace/Splint: LLE prosthesis Restrictions Weight Bearing Restrictions: No   Locomotion : Ambulation Ambulation/Gait Assistance: 5: Supervision Gait Gait velocity: 1.2'/second; risk for recurrent falls Wheelchair Mobility Distance: 75    Other Treatments: Treatments Neuromuscular Facilitation: Right;Left;Activity to increase grading;Activity to increase sustained activation;Activity to increase anterior-posterior weight shifting  See FIM for current functional status  Therapy/Group: Individual Therapy  Agostino Gorin 01/18/2013, 12:31 PM

## 2013-01-18 NOTE — Progress Notes (Signed)
Lantus 25 units given.

## 2013-01-18 NOTE — Progress Notes (Signed)
Patient CBG 155. [name] Ina, PA notified. No new orders noted. Patient resting quietly at this time. Will continue to monitor.

## 2013-01-19 ENCOUNTER — Inpatient Hospital Stay (HOSPITAL_COMMUNITY): Payer: Managed Care, Other (non HMO) | Admitting: Occupational Therapy

## 2013-01-19 ENCOUNTER — Inpatient Hospital Stay (HOSPITAL_COMMUNITY): Payer: Managed Care, Other (non HMO)

## 2013-01-19 LAB — GLUCOSE, CAPILLARY: Glucose-Capillary: 121 mg/dL — ABNORMAL HIGH (ref 70–99)

## 2013-01-19 LAB — URINALYSIS, ROUTINE W REFLEX MICROSCOPIC
Glucose, UA: NEGATIVE mg/dL
Hgb urine dipstick: NEGATIVE
Ketones, ur: NEGATIVE mg/dL
Specific Gravity, Urine: 1.027 (ref 1.005–1.030)
pH: 5.5 (ref 5.0–8.0)

## 2013-01-19 LAB — URINE MICROSCOPIC-ADD ON

## 2013-01-19 NOTE — Progress Notes (Signed)
Physical Therapy Session Note  Patient Details  Name: [name] MRN: [id number] Date of Birth: 09-Apr-1956  Today's Date: 01/19/2013 Time: 7829-5621 and0803-0903 Time Calculation (min): 60 and 45 min  Short Term Goals: Week 1:  PT Short Term Goal 1 (Week 1): STG=LTG due to short LOS, supervision overall  Skilled Therapeutic Interventions/Progress Updates:   Treatment 1:    Sitting balance- supervision and cues for safe technique when donning panties sitting EOB without prosthesis. Standing balance - supervision with support of bed rail for pressing residual limb into socket  Simulated car transfer with supervision.  Cues for problem solving getting LEs into car due to higher floorboards.  Gait x 150' on level tile and carpet using RW, cues for keeping feet within RW, upright posture, hand placement during sit>< stand at times.  Stand> sit with poor eccentric control of LEs. Gait around cones and stepping over poles on floor, with contact min guard assist, mod cues for technique due to pt's poor retention of new info.  Pt reported that she has throw rugs on hardwood floors at home.  She initially stated that she sometimes tripped on the edge of a throw rug, then 2 minutes later, stated she never trips on her rugs.  Ex on NuStep at level , rated 13 on BORG scale, x 8 minutes x 3 minutes, for activity tolerance.HR before ex= 86; after ex= 89  Pt stated she had 3 steps without rails at home, and PTA managed steps with SPC, then used RW on level terrain.  Treatment 2:  W/c> mat stand pivot transfer to R: therapist requested pt pretend that she was alone transferring ; pt unsafely attempted to stand without RW, without moving legrests to sides.    Gait as above, and up/down 5 steps 2 rails and up/down ramp with supervision; up/down curb with RW with min guard assist.  neuromuscular re-education via demo, tactile and VCs for balance retraining on compliant Airex mat, during wt shifting  laterally, toes up/down and mini squats.  All dynamic movements on mat with min assist for balance, continual LOB backwards.  Pt stated she has a ramp at home since her amputation; confirmed with SW.  Pt either did not remember that this AM, or did not understand my question as to how she got in/out of her house.   Team is concerned about pt's safety at home after d/c; neuropsych is evaluating pt tomorrow for dementia vs depression. LTGs have been downgraded to supervision due to apparent cognitive deficits.  Therapy Documentation Precautions:  Precautions Precautions: Fall Precaution Comments: CVA, L prosthetic limb Required Braces or Orthoses: Other Brace/Splint Other Brace/Splint: LLE prosthesis Restrictions Weight Bearing Restrictions: No       Other Treatments: Treatments Therapeutic Activity: standing on compliant mat to elicit balance strategies Neuromuscular Facilitation: Right;Left;Activity to increase grading;Activity to increase anterior-posterior weight shifting;Activity to increase timing and sequencing;Forced use;Lower Extremity  See FIM for current functional status  Therapy/Group: Individual Therapy  Anella Nakata 01/19/2013, 4:00 PM

## 2013-01-19 NOTE — Patient Care Conference (Signed)
Inpatient RehabilitationTeam Conference and Plan of Care Update Date: 01/19/2013   Time: 10:55 Am    Patient Name: [name]      Medical Record Number: [id number]  Date of Birth: Nov 02, 1955 Sex: Female         Room/Bed: 4M03C/4M03C-01 Payor Info: Payor: MEDICAID PENDING / Plan: MEDICAID PENDING / Product Type: *No Product type* /    Admitting Diagnosis: L CVA OLD L BKA  Admit Date/Time:  01/13/2013  4:11 PM Admission Comments: No comment available   Primary Diagnosis:  CVA (cerebral infarction) Principal Problem: CVA (cerebral infarction)  Patient Active Problem List   Diagnosis Date Noted  . Hemiparesis 01/10/2013  . Tobacco abuse   . Precordial pain 06/07/2012  . Chest pain at rest 02/09/2012  . Unilateral complete BKA 01/16/2012  . Chest pain 01/07/2012  . Dizziness 01/07/2012  . Hypoglycemia associated with diabetes 12/28/2011  . Diabetic ulcer of left foot 12/24/2011  . Cellulitis of left lower extremity 12/23/2011  . Paresthesia of right arm and leg 12/17/2011  . Candiduria 12/16/2011  . Facial droop 12/15/2011  . Hyperglycemia 12/15/2011  . Mild dehydration 12/15/2011  . Numbness 12/15/2011  . TIA (transient ischemic attack) 12/15/2011  . Syncope 07/02/2011  . Hypokalemia 07/02/2011  . HTN (hypertension) 07/02/2011  . UTI (lower urinary tract infection) 07/02/2011  . H/O 07/02/2011  . Normocytic anemia 07/02/2011  . PERIPHERAL NEUROPATHY 04/02/2009  . HYPERTENSION 04/02/2009  . CAD, NATIVE VESSEL 04/02/2009  . HIATAL HERNIA 04/02/2009  . Irritable bowel syndrome 04/02/2009  . HYPERLIPIDEMIA 05/30/2008  . OBESITY 05/30/2008  . LEFT VENTRICULAR FUNCTION, DECREASED 05/30/2008  . CVA (cerebral infarction) 05/30/2008  . GERD 05/30/2008  . SYNCOPE 05/30/2008  . DM type 2, uncontrolled, with neuropathy 05/30/2008    Expected Discharge Date: Expected Discharge Date: 01/22/13  Team Members Present: Physician leading conference: Dr. Claudette Laws Social  Worker Present: Staci Acosta, LCSW;Becky Akylah Hascall, LCSW Nurse Present: Gregor Hams, RN PT Present: Edman Circle, Lillie Columbia, PT OT Present: Perrin Maltese, OT;Kayla Perkinson, Felipa Eth, OT SLP Present: Fae Pippin, SLP PPS Coordinator present : Tora Duck, RN, CRRN     Current Status/Progress Goal Weekly Team Focus  Medical   No pain complaints. No bowel or bladder problems.  Maintain medical stability  Continue rehabilitation program, establish discharge plans   Bowel/Bladder   Continent of bowel and bladder. Incontinent at times.Uses bedpan at times. Bathroom priveleges.LBM 8/31. Senokot-s given awaiting for results.  Continent of bowel and bladder  Monitor any s/s of constipation/diarrhea q shift.   Swallow/Nutrition/ Hydration     WFL        ADL's   min assist with toilet and tub bench transfers with walker, supervision with w/c; steadying assist with bathing, dressing, toileting  supervision tub bench transfers, bathing, LB dressing, simple meal prep, light housework;  mod I toileting, toilet transfer  functional mobility training, ADL retraining, pt/family education   Mobility   supervision with mod cues for all  modified independent basic transfers, superviision gait and w/c propulsion x 150', 5 steps; superviison/cueing assist for carryover and awareness during mobility activities  downgraded LTGs due to cognitive problems; functional mobility, gait , w/c , strengthening, balance   Communication     WFL        Safety/Cognition/ Behavioral Observations    na        Pain   ain to old  left BKA.Tylenol 640 mg given with good relief noted.  Pain level 3 or  less on a scale of 0-10.  Monitor any onset of pain and assess for effectiveness of PRN pain meds.   Skin   skin intact.  No new skin breakdown/infection.  Monitor skin q shift.      *See Care Plan and progress notes for long and short-term goals.  Barriers to Discharge: Still not independent     Possible Resolutions to Barriers:  Continue rehabilitation, identify post discharge caregiver    Discharge Planning/Teaching Needs:  Pt only has intermittent assist-unsure if safe alone at home, forgetful.  Pt and family discussing options for pt      Team Discussion:  Poor problem solving-taking medication concern-checking BS at home.  Recommendation for 24 hr supervision for safety.  Discuss with family.  Neuro-psych to see tomorrow regarding compentency  Revisions to Treatment Plan:  24 hr supervision recommended-safety   Continued Need for Acute Rehabilitation Level of Care: The patient requires daily medical management by a physician with specialized training in physical medicine and rehabilitation for the following conditions: Daily direction of a multidisciplinary physical rehabilitation program to ensure safe treatment while eliciting the highest outcome that is of practical value to the patient.: Yes Daily medical management of patient stability for increased activity during participation in an intensive rehabilitation regime.: Yes Daily analysis of laboratory values and/or radiology reports with any subsequent need for medication adjustment of medical intervention for : Neurological problems  Verdine Grenfell, Lemar Livings 01/19/2013, 2:00 PM

## 2013-01-19 NOTE — Progress Notes (Signed)
Patient ID: [name], female   DOB: 03/25/1956, 57 y.o.   MRN: [id number] 56 y.o. 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 Patient used rolling walker as well as a left prosthesis prior to admission. Admitted 01/10/2013 with right-sided weakness. MRI of the brain showed acute nonhemorrhagic infarct involving the left putamen. Remote lacunar infarcts in the basal ganglia bilaterally as well as left cerebellum. MRA of the head was advanced small vessel disease moderate proximal basilar artery stenosis. Echocardiogram with ejection fraction of 60% grade 1 diastolic dysfunction. Patient with TEE as well as loop recorder in the past from prior CVA that was unremarkable. Carotid Dopplers with no ICA stenosis.  Subjective/Complaints: No new issues overnite Review of Systems  All other systems reviewed and are negative.    Objective: Vital Signs: Blood pressure 132/74, pulse 82, temperature 97.7 F (36.5 C), temperature source Oral, resp. rate 18, height 5\' 6"  (1.676 m), weight 83.598 kg (184 lb 4.8 oz), SpO2 97.00%. No results found. Results for orders placed during the hospital encounter of 01/13/13 (from the past 72 hour(s))  GLUCOSE, CAPILLARY     Status: Abnormal   Collection Time    01/16/13 11:45 AM      Result Value Range   Glucose-Capillary 145 (*) 70 - 99 mg/dL   Comment 1 Notify RN    GLUCOSE, CAPILLARY     Status: Abnormal   Collection Time    01/16/13  4:39 PM      Result Value Range   Glucose-Capillary 120 (*) 70 - 99 mg/dL   Comment 1 Notify RN    GLUCOSE, CAPILLARY     Status: Abnormal   Collection Time    01/16/13  9:48 PM      Result Value Range   Glucose-Capillary 146 (*) 70 - 99 mg/dL   Comment 1 Notify RN    GLUCOSE, CAPILLARY     Status: None   Collection Time     01/17/13  7:44 AM      Result Value Range   Glucose-Capillary 93  70 - 99 mg/dL  GLUCOSE, CAPILLARY     Status: None   Collection Time    01/17/13 12:13 PM      Result Value Range   Glucose-Capillary 78  70 - 99 mg/dL  GLUCOSE, CAPILLARY     Status: Abnormal   Collection Time    01/17/13  4:45 PM      Result Value Range   Glucose-Capillary 108 (*) 70 - 99 mg/dL  GLUCOSE, CAPILLARY     Status: Abnormal   Collection Time    01/17/13  8:07 PM      Result Value Range   Glucose-Capillary 165 (*) 70 - 99 mg/dL  GLUCOSE, CAPILLARY     Status: Abnormal   Collection Time    01/18/13  7:20 AM      Result Value Range   Glucose-Capillary 123 (*) 70 - 99 mg/dL   Comment 1 Notify RN    GLUCOSE, CAPILLARY     Status: None   Collection Time    01/18/13 11:19 AM      Result Value Range   Glucose-Capillary 88  70 - 99 mg/dL   Comment 1 Notify RN    GLUCOSE, CAPILLARY     Status: Abnormal   Collection Time    01/18/13  4:27 PM  Result Value Range   Glucose-Capillary 106 (*) 70 - 99 mg/dL   Comment 1 Notify RN    GLUCOSE, CAPILLARY     Status: Abnormal   Collection Time    01/18/13  8:17 PM      Result Value Range   Glucose-Capillary 416 (*) 70 - 99 mg/dL  GLUCOSE, CAPILLARY     Status: Abnormal   Collection Time    01/18/13  9:35 PM      Result Value Range   Glucose-Capillary 155 (*) 70 - 99 mg/dL      Head: Normocephalic.  Eyes: EOM are normal.  Neck: Normal range of motion. Neck supple. No thyromegaly present.  Cardiovascular: Normal rate and regular rhythm.  Pulmonary/Chest: Effort normal and breath sounds normal. No respiratory distress.  Abdominal: Soft. Bowel sounds are normal. She exhibits no distension.  Neurological: She is alert and oriented to person, place, and time.  Follows full commands  Skin:  Left BKA site well healed  Psychiatric: She has a normal mood and affect.  motor strength: 4+/5 in the right deltoid, bicep, tricep, grip, hip flexor, knee  extensors, ankle dyslexia plantar flexor  5/5 in the left deltoid, bicep, tricep, grip, hip flexor  Sensory intact to light touch in bilateral upper and reduced sensation to light touch right foot  Responses are slowed.     Assessment/Plan: 1. Functional deficits secondary to L putamen which require 3+ hours per day of interdisciplinary therapy in a comprehensive inpatient rehab setting. Physiatrist is providing close team supervision and 24 hour management of active medical problems listed below. Physiatrist and rehab team continue to assess barriers to discharge/monitor patient progress toward functional and medical goals. Team conference today please see physician documentation under team conference tab, met with team face-to-face to discuss problems,progress, and goals. Formulized individual treatment plan based on medical history, underlying problem and comorbidities. FIM: FIM - Bathing Bathing Steps Patient Completed: Chest;Right Arm;Left Arm;Abdomen;Front perineal area;Buttocks;Right lower leg (including foot);Right upper leg;Left upper leg Bathing: 5: Supervision: Safety issues/verbal cues  FIM - Upper Body Dressing/Undressing Upper body dressing/undressing steps patient completed: Thread/unthread right sleeve of pullover shirt/dresss;Thread/unthread left sleeve of pullover shirt/dress;Put head through opening of pull over shirt/dress;Pull shirt over trunk;Thread/unthread right bra strap;Thread/unthread left bra strap;Hook/unhook bra Upper body dressing/undressing: 5: Supervision: Safety issues/verbal cues FIM - Lower Body Dressing/Undressing Lower body dressing/undressing steps patient completed: Thread/unthread right underwear leg;Thread/unthread left underwear leg;Pull underwear up/down;Thread/unthread right pants leg;Thread/unthread left pants leg;Pull pants up/down;Fasten/unfasten right shoe;Don/Doff right shoe Lower body dressing/undressing: 4: Steadying Assist  FIM -  Toileting Toileting steps completed by patient: Adjust clothing prior to toileting;Performs perineal hygiene;Adjust clothing after toileting Toileting Assistive Devices: Grab bar or rail for support Toileting: 4: Steadying assist  FIM - Diplomatic Services operational officer Devices: Art gallery manager Transfers: 4-To toilet/BSC: Min A (steadying Pt. > 75%);4-From toilet/BSC: Min A (steadying Pt. > 75%)  FIM - Bed/Chair Transfer Bed/Chair Transfer Assistive Devices: Bed rails;Arm rests Bed/Chair Transfer: 5: Supine > Sit: Supervision (verbal cues/safety issues);5: Bed > Chair or W/C: Supervision (verbal cues/safety issues)  FIM - Locomotion: Wheelchair Distance: 75 Locomotion: Wheelchair: 2: Travels 50 - 149 ft with supervision, cueing or coaxing FIM - Locomotion: Ambulation Locomotion: Ambulation Assistive Devices: Designer, industrial/product Ambulation/Gait Assistance: 5: Supervision Locomotion: Ambulation: 5: Travels 150 ft or more with supervision/safety issues  Comprehension Comprehension Mode: Auditory Comprehension: 5-Follows basic conversation/direction: With no assist  Expression Expression Mode: Verbal Expression: 5-Expresses basic needs/ideas: With extra time/assistive device  Social  Interaction Social Interaction: 6-Interacts appropriately with others with medication or extra time (anti-anxiety, antidepressant).  Problem Solving Problem Solving: 5-Solves basic problems: With no assist  Memory Memory: 6-More than reasonable amt of time  Medical Problem List and Plan:  1. 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 DVT Prophylaxis/Anticoagulation: Subcutaneous Lovenox. Monitor platelet counts and any signs of bleeding  3. Pain Management: Tylenol as needed  4. Neuropsych: This patient is capable of making decisions on her own behalf.  5. Hypertension. Lisinopril 20 mg daily. Monitor with increased activity  6. Diabetes mellitus with  peripheral neuropathy. Controlled Hemoglobin A1c 9.2. Lantus insulin 25 units each bedtime. Check CBGs a.c. and at bedtime, CBG spike to >400 ? Error vs noncompliance with Diet 7. Hyperlipidemia. Zocor   LOS (Days) 6 A FACE TO FACE EVALUATION WAS PERFORMED  Isamu Trammel E 01/19/2013, 8:31 AM

## 2013-01-19 NOTE — Progress Notes (Signed)
Social Work Patient ID: [name], female   DOB: 1956-05-05, 57 y.o.   MRN: [id number] Met with pt and  spoke with Tamekia-niece to inform team conference goals -supervision level and discharge 9/6.  Discussed the safety issues with pt And niece and pt feels she can get a nephew or niece to stay with her.  Niece reports she understands out concerns and is worried also about her aunt. She will talk with pt's older sister and then to pt.  Informed assisted living not an option due to will not take pending Medicaid and pt does not have th funds to afford this. Neuro-psych to evaluate tomorrow to assess pt's competency.  Offered short term NHP to both.  Pt refused and feels she is back to her current level prior to admission. Await Tamekia's return call tomorrow after she speaks with both of her aunts.  Come up with a safe discharge plan.

## 2013-01-19 NOTE — Progress Notes (Signed)
Occupational Therapy Session Note  Patient Details  Name: [name] MRN: [id number] Date of Birth: [birth year]  Today's Date: 01/19/2013 Time: 0900-1000 and 1100-1130 Time Calculation (min): 60 min and 30 min  Short Term Goals: Week 1:  OT Short Term Goal 1 (Week 1): N/A secondary to ELOS  Skilled Therapeutic Interventions/Progress Updates:    Visit 1:  Pain: none  Tx: Pt seen for BADL retraining of B/D at shower level with a focus on safe functional mobility.  Pt ambulated in/out shower with prosthesis with supervision. She completed B/D from tub bench with supervision and 1-2 cues for safe technique.  Pt stated she felt ready to go home and did not feel that she will have any problems at home. Pt is not aware of some of her high level cognitive deficits. Pt resting in chair at end of session in her room with call light in reach.   Visit 2:  Pain:  None  Tx: Pt worked on dynamic standing balance at table during checkers game with supervision for 15 min. She then worked on [employer] with weighted dowel bar for 15 min. Pt resting in chair at end of session in her room with call light in reach.  Therapy Documentation Precautions:  Precautions Precautions: Fall Precaution Comments: CVA, L prosthetic limb Required Braces or Orthoses: Other Brace/Splint Other Brace/Splint: LLE prosthesis Restrictions Weight Bearing Restrictions: No Pain: Pain Assessment Pain Assessment: No/denies pain ADL:  See FIM for current functional status  Therapy/Group: Individual Therapy  Esli Clements 01/19/2013, 12:09 PM

## 2013-01-20 ENCOUNTER — Inpatient Hospital Stay (HOSPITAL_COMMUNITY): Payer: Managed Care, Other (non HMO) | Admitting: Occupational Therapy

## 2013-01-20 ENCOUNTER — Inpatient Hospital Stay (HOSPITAL_COMMUNITY): Payer: Managed Care, Other (non HMO)

## 2013-01-20 ENCOUNTER — Encounter (HOSPITAL_COMMUNITY): Payer: Managed Care, Other (non HMO)

## 2013-01-20 LAB — GLUCOSE, CAPILLARY
Glucose-Capillary: 88 mg/dL (ref 70–99)
Glucose-Capillary: 123 mg/dL — ABNORMAL HIGH (ref 70–99)
Glucose-Capillary: 139 mg/dL — ABNORMAL HIGH (ref 70–99)

## 2013-01-20 LAB — CREATININE, SERUM: GFR calc Af Amer: >90 mL/min (ref >90)

## 2013-01-20 NOTE — Progress Notes (Signed)
Occupational Therapy Session Note  Patient Details  Name: [name] MRN: [id number] Date of Birth: December 14, 1955  Today's Date: 01/20/2013 Time: 4098-1191 Time Calculation (min): 43 min  Skilled Therapeutic Interventions/Progress Updates:    Pt was rolled down to the OT gym in her wheelchair.  She transferred to the mat with close supervision and without use of the RW.  She needed cueing to remember to lock the left brake on the wheelchair.  Pt was not told to transfer without her walker she just proceeded to gut up and try.  When therapist asked about using the walker she stated she just didn't use it.  Discussed need for walker to be safe with transfers. Worked on standing balance with use of the walker to perform ring toss. Alternated using both left and right hands.  Had pt integrate use of a reacher to pick up the rings from the floor for next attempt.  No LOB noted with activity.  Progressed to use of the UE ergonometer for 2 intervals of 4 mins on level 10 resistance.  Pt able to maintain 20-25 RPMs for each set.  When finished, pt ambulated back to the room with use of her RW.     Therapy Documentation Precautions:  Precautions Precautions: Fall Precaution Comments: CVA, L prosthetic limb Required Braces or Orthoses: Other Brace/Splint Other Brace/Splint: LLE prosthesis Restrictions Weight Bearing Restrictions: No  Pain: Pain Assessment Pain Assessment: No/denies pain Pain Score: 7  Pain Location: Knee Pain Orientation: Left Pain Intervention(s): Medication (See eMAR) ADL: See FIM for current functional status  Therapy/Group: Individual Therapy  Jimmi Sidener OTR/L 01/20/2013, 12:30 PM

## 2013-01-20 NOTE — Plan of Care (Signed)
Problem: RH KNOWLEDGE DEFICIT Goal: RH STG INCREASE KNOWLEDGE OF DIABETES Patient will be able to verbalize the signs and symptoms of hypoglycemia or hyperglycemia. Patient will be able to verbalize importance of management of insulin with demonstration of insulin injection as well diet and exercise.  Outcome: Progressing Patient able to state she takes Lantus 25 units each night and that she checks her blood sugars 2-3 times a day. Can not state signs or symptoms of high or low blood sugar

## 2013-01-20 NOTE — Progress Notes (Signed)
Patient ID: [name], female   DOB: [birth year], 57 y.o.   MRN: [id number] 57 y.o. 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 Patient used rolling walker as well as a left prosthesis prior to admission. Admitted 01/10/2013 with right-sided weakness. MRI of the brain showed acute nonhemorrhagic infarct involving the left putamen. Remote lacunar infarcts in the basal ganglia bilaterally as well as left cerebellum. MRA of the head was advanced small vessel disease moderate proximal basilar artery stenosis. Echocardiogram with ejection fraction of 60% grade 1 diastolic dysfunction. Patient with TEE as well as loop recorder in the past from prior CVA that was unremarkable. Carotid Dopplers with no ICA stenosis.  Subjective/Complaints:  Patient looks brighter  today Review of Systems  All other systems reviewed and are negative.    Objective: Vital Signs: Blood pressure 146/79, pulse 73, temperature 97.9 F (36.6 C), temperature source Oral, resp. rate 18, height 5\' 6"  (1.676 m), weight 83.598 kg (184 lb 4.8 oz), SpO2 100.00%. No results found. Results for orders placed during the hospital encounter of 01/13/13 (from the past 72 hour(s))  GLUCOSE, CAPILLARY     Status: None   Collection Time    01/17/13 12:13 PM      Result Value Range   Glucose-Capillary 78  70 - 99 mg/dL  GLUCOSE, CAPILLARY     Status: Abnormal   Collection Time    01/17/13  4:45 PM      Result Value Range   Glucose-Capillary 108 (*) 70 - 99 mg/dL  GLUCOSE, CAPILLARY     Status: Abnormal   Collection Time    01/17/13  8:07 PM      Result Value Range   Glucose-Capillary 165 (*) 70 - 99 mg/dL  GLUCOSE, CAPILLARY     Status: Abnormal   Collection Time    01/18/13  7:20 AM      Result Value Range   Glucose-Capillary 123 (*) 70  - 99 mg/dL   Comment 1 Notify RN    GLUCOSE, CAPILLARY     Status: None   Collection Time    01/18/13 11:19 AM      Result Value Range   Glucose-Capillary 88  70 - 99 mg/dL   Comment 1 Notify RN    GLUCOSE, CAPILLARY     Status: Abnormal   Collection Time    01/18/13  4:27 PM      Result Value Range   Glucose-Capillary 106 (*) 70 - 99 mg/dL   Comment 1 Notify RN    GLUCOSE, CAPILLARY     Status: Abnormal   Collection Time    01/18/13  8:17 PM      Result Value Range   Glucose-Capillary 416 (*) 70 - 99 mg/dL  GLUCOSE, CAPILLARY     Status: Abnormal   Collection Time    01/18/13  9:35 PM      Result Value Range   Glucose-Capillary 155 (*) 70 - 99 mg/dL  GLUCOSE, CAPILLARY     Status: Abnormal   Collection Time    01/19/13  7:46 AM      Result Value Range   Glucose-Capillary 128 (*) 70 - 99 mg/dL   Comment 1 Notify RN    GLUCOSE, CAPILLARY     Status: None   Collection Time    01/19/13 11:44 AM      Result Value  Range   Glucose-Capillary 98  70 - 99 mg/dL   Comment 1 Notify RN    URINALYSIS, ROUTINE W REFLEX MICROSCOPIC     Status: Abnormal   Collection Time    01/19/13  1:49 PM      Result Value Range   Color, Urine YELLOW  YELLOW   APPearance CLEAR  CLEAR   Specific Gravity, Urine 1.027  1.005 - 1.030   pH 5.5  5.0 - 8.0   Glucose, UA NEGATIVE  NEGATIVE mg/dL   Hgb urine dipstick NEGATIVE  NEGATIVE   Bilirubin Urine SMALL (*) NEGATIVE   Ketones, ur NEGATIVE  NEGATIVE mg/dL   Protein, ur NEGATIVE  NEGATIVE mg/dL   Urobilinogen, UA 1.0  0.0 - 1.0 mg/dL   Nitrite NEGATIVE  NEGATIVE   Leukocytes, UA TRACE (*) NEGATIVE  URINE MICROSCOPIC-ADD ON     Status: Abnormal   Collection Time    01/19/13  1:49 PM      Result Value Range   Squamous Epithelial / LPF FEW (*) RARE   WBC, UA 0-2  <3 WBC/hpf   Bacteria, UA RARE  RARE   Urine-Other MANY YEAST    GLUCOSE, CAPILLARY     Status: Abnormal   Collection Time    01/19/13  4:18 PM      Result Value Range    Glucose-Capillary 121 (*) 70 - 99 mg/dL  GLUCOSE, CAPILLARY     Status: Abnormal   Collection Time    01/19/13  8:03 PM      Result Value Range   Glucose-Capillary 157 (*) 70 - 99 mg/dL  CREATININE, SERUM     Status: None   Collection Time    01/20/13  5:46 AM      Result Value Range   Creatinine, Ser 0.75  0.50 - 1.10 mg/dL   GFR calc non Af Amer >90  >90 mL/min   GFR calc Af Amer >90  >90 mL/min   Comment: (NOTE)     The eGFR has been calculated using the CKD EPI equation.     This calculation has not been validated in all clinical situations.     eGFR's persistently <90 mL/min signify possible Chronic Kidney     Disease.  GLUCOSE, CAPILLARY     Status: None   Collection Time    01/20/13  7:35 AM      Result Value Range   Glucose-Capillary 86  70 - 99 mg/dL      Head: Normocephalic.  Eyes: EOM are normal.  Neck: Normal range of motion. Neck supple. No thyromegaly present.  Cardiovascular: Normal rate and regular rhythm.  Pulmonary/Chest: Effort normal and breath sounds normal. No respiratory distress.  Abdominal: Soft. Bowel sounds are normal. She exhibits no distension.  Neurological: She is alert and oriented to person, place, and time.  Follows full commands  Skin:  Left BKA site well healed  Psychiatric: She has a normal mood and affect.  motor strength: 4+/5 in the right deltoid, bicep, tricep, grip, hip flexor, knee extensors, ankle dyslexia plantar flexor  5/5 in the left deltoid, bicep, tricep, grip, hip flexor  Sensory intact to light touch in bilateral upper and reduced sensation to light touch right foot  Responses are slowed.     Assessment/Plan: 1. Functional deficits secondary to L putamen which require 3+ hours per day of interdisciplinary therapy in a comprehensive inpatient rehab setting. Physiatrist is providing close team supervision and 24 hour management of active medical problems listed  below. Physiatrist and rehab team continue to assess  barriers to discharge/monitor patient progress toward functional and medical goals.  FIM: FIM - Bathing Bathing Steps Patient Completed: Chest;Right Arm;Left Arm;Abdomen;Front perineal area;Buttocks;Right lower leg (including foot);Right upper leg;Left upper leg Bathing: 5: Supervision: Safety issues/verbal cues  FIM - Upper Body Dressing/Undressing Upper body dressing/undressing steps patient completed: Thread/unthread right sleeve of pullover shirt/dresss;Thread/unthread left sleeve of pullover shirt/dress;Put head through opening of pull over shirt/dress;Pull shirt over trunk;Thread/unthread right bra strap;Thread/unthread left bra strap;Hook/unhook bra Upper body dressing/undressing: 5: Supervision: Safety issues/verbal cues FIM - Lower Body Dressing/Undressing Lower body dressing/undressing steps patient completed: Thread/unthread right underwear leg;Thread/unthread left underwear leg;Pull underwear up/down;Thread/unthread right pants leg;Thread/unthread left pants leg;Pull pants up/down;Fasten/unfasten right shoe;Don/Doff right shoe Lower body dressing/undressing: 5: Supervision: Safety issues/verbal cues  FIM - Toileting Toileting steps completed by patient: Adjust clothing prior to toileting;Performs perineal hygiene;Adjust clothing after toileting Toileting Assistive Devices: Grab bar or rail for support Toileting: 4: Steadying assist  FIM - Diplomatic Services operational officer Devices: Art gallery manager Transfers: 4-To toilet/BSC: Min A (steadying Pt. > 75%);4-From toilet/BSC: Min A (steadying Pt. > 75%)  FIM - Bed/Chair Transfer Bed/Chair Transfer Assistive Devices: Bed rails;Arm rests Bed/Chair Transfer: 5: Supine > Sit: Supervision (verbal cues/safety issues);5: Bed > Chair or W/C: Supervision (verbal cues/safety issues)  FIM - Locomotion: Wheelchair Distance: 75 Locomotion: Wheelchair: 2: Travels 50 - 149 ft with supervision, cueing or coaxing FIM - Locomotion:  Ambulation Locomotion: Ambulation Assistive Devices: Designer, industrial/product Ambulation/Gait Assistance: 5: Supervision Locomotion: Ambulation: 5: Travels 150 ft or more with supervision/safety issues  Comprehension Comprehension Mode: Auditory Comprehension: 5-Follows basic conversation/direction: With no assist  Expression Expression Mode: Verbal Expression: 5-Expresses basic needs/ideas: With extra time/assistive device  Social Interaction Social Interaction: 6-Interacts appropriately with others with medication or extra time (anti-anxiety, antidepressant).  Problem Solving Problem Solving: 5-Solves basic problems: With no assist  Memory Memory: 6-More than reasonable amt of time  Medical Problem List and Plan:  1. 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 DVT Prophylaxis/Anticoagulation: Subcutaneous Lovenox. Monitor platelet counts and any signs of bleeding  3. Pain Management: Tylenol as needed  4. Neuropsych: This patient is capable of making decisions on her own behalf.  5. Hypertension. Lisinopril 20 mg daily. Monitor with increased activity  6. Diabetes mellitus with peripheral neuropathy. Controlled Hemoglobin A1c 9.2. Lantus insulin 25 units each bedtime. Check CBGs a.c. and at bedtime, CBG spike to >400 ? Error vs noncompliance with Diet 7. Hyperlipidemia. Zocor   LOS (Days) 7 A FACE TO FACE EVALUATION WAS PERFORMED  KIRSTEINS,ANDREW E 01/20/2013, 8:04 AM

## 2013-01-20 NOTE — Progress Notes (Signed)
Social Work Patient ID: [name], female   DOB: 05-26-1955, 57 y.o.   MRN: [id number] Met with pt to see if have spoken to niece or her sister.  She reported no.  Discussed again the concerns Team has with her going home with only intermittent assist.  She reports her niece will be there Sat and her nephew is staying The night.  Concern is her checking her blood sugars and taking her medications as prescribed.  She states: " I will take them like I always do." Will call her niece and discuss if family have com e up with a plan for pt.

## 2013-01-20 NOTE — Progress Notes (Signed)
Physical Therapy Session Note  Patient Details  Name: [name] MRN: [id number] Date of Birth: September 13, 1955  Today's Date: 01/20/2013 Time: 0803-0828 1000-1100 Time Calculation (min): 25 and 60 min  Short Term Goals: Week 1:  PT Short Term Goal 1 (Week 1): STG=LTG due to short LOS, S overall  Skilled Therapeutic Interventions/Progress Updates:   Pt plans to d/c home.  Family is trying to arrange supervision for her in her home, possibly from nephew.  Treatment 1:  Pt finishing breakfast.  Discussed her locomotion PTA.  Pt stated she ambulated to the car with family to go shopping.  Once in the store, she used a wheelchair to get around. He driveway is gravel, sloping.  Pt donned panties in sitting and stood with use of bed rail to pull them up, without L prosthesis on, safely with supervision.  Pt donned prosthesis on first attempt, but needed to stand using bed rail to push residual limb into socket.  neuromuscular re-education via tactile cues for:  AROM R heel cord, standing balance retraining: sit<> stand without use of UEs and wt shifting heel>< toes, facilitating ankle and hip strategies RLE; mini squats, and RLE calf raises, all with min assist for balance  Gait with Rw in room to BR; pt stated she did not need to use toilet.  Gait on BR floor tiles and in bedroom with S.   Treatment 2:  Treatment included outdoors gait, neuromuscular re-education as above for activities below, transfers.  Pt prepped w/c to stand with RW , needing cues for pants leg which caught on legrest, and swinging legrests out of the way.  Pt performed gait outdoors on brick, sloping concrete, up/down 4 steps with 1 rail, all with close supervision, VCs for safe speed when ambulating downhill.  Pt initiated 2 hands on 1 rail without cues; R knee buckled slightly when descending, recovered independently.  neuromuscular re-education in sitting with resistance for hip isolated movements, active knee  extension with isometric hold at end range, and R ankle pumps at end range  In gym, up/down ramp and curb with Rw, with close supervision, without VCs for technique.    Therapy Documentation Precautions:  Precautions Precautions: Fall Precaution Comments: CVA, L prosthetic limb Required Braces or Orthoses: Other Brace/Splint Other Brace/Splint: LLE prosthesis Restrictions Weight Bearing Restrictions: No     Locomotion : Ambulation Ambulation/Gait Assistance: 5: Supervision Wheelchair Mobility Distance: 150    See FIM for current functional status  Therapy/Group: Individual Therapy  Brittanyann Wittner 01/20/2013, 4:28 PM

## 2013-01-20 NOTE — Plan of Care (Signed)
Problem: RH KNOWLEDGE DEFICIT Goal: RH STG INCREASE KNOWLEDGE OF HYPERTENSION Patient will be able to verbalize the signs and symptoms of hypertension and hypotension.Patient will be able to verbalize the importance of medication regimen as well as diet control.  Outcome: Progressing Knows she is onmedication for blood pressure however she can not recall name of medication

## 2013-01-20 NOTE — Progress Notes (Signed)
Occupational Therapy Session Note  Patient Details  Name: [name] MRN: [id number] Date of Birth: 07-10-1955  Today's Date: 01/20/2013 Time: 0900-1000 Time Calculation (min): 60 min  Short Term Goals: Week 1:  OT Short Term Goal 1 (Week 1): N/A secondary to ELOS  Skilled Therapeutic Interventions/Progress Updates:      Pt seen for BADL retraining of bathing and dressing with a focus on safe functional mobility, sequencing of tasks and standing balance.  Pt was instructed to complete her self care as independently as possible as she will be going home in 2 days. After set up with clothing, pt ambulated with prosthesis to shower and completed B/D with supervision for balance when standing without prosthesis and min cues to bathe self sufficiently. Pt donned prosthesis, but it took two attempts.  No LOB today.  Pt propelled chair to gym to work on UBE for UE strengthening for 10 min.  Pt's B grip tested: 45 lbs of grip in both hands.  Pt's PT had arrived for her next session.  Therapy Documentation Precautions:  Precautions Precautions: Fall Precaution Comments: CVA, L prosthetic limb Required Braces or Orthoses: Other Brace/Splint Other Brace/Splint: LLE prosthesis Restrictions Weight Bearing Restrictions: No      Pain: Pain Assessment Pain Assessment: No/denies pain  ADL:  See FIM for current functional status  Therapy/Group: Individual Therapy  [name] 01/20/2013, 11:47 AM

## 2013-01-21 ENCOUNTER — Inpatient Hospital Stay (HOSPITAL_COMMUNITY): Payer: Managed Care, Other (non HMO) | Admitting: Occupational Therapy

## 2013-01-21 ENCOUNTER — Inpatient Hospital Stay (HOSPITAL_COMMUNITY): Payer: Managed Care, Other (non HMO) | Admitting: Physical Therapy

## 2013-01-21 DIAGNOSIS — I739 Peripheral vascular disease, unspecified: Secondary | ICD-10-CM

## 2013-01-21 DIAGNOSIS — S88119A Complete traumatic amputation at level between knee and ankle, unspecified lower leg, initial encounter: Secondary | ICD-10-CM

## 2013-01-21 DIAGNOSIS — I6330 Cerebral infarction due to thrombosis of unspecified cerebral artery: Secondary | ICD-10-CM

## 2013-01-21 DIAGNOSIS — L98499 Non-pressure chronic ulcer of skin of other sites with unspecified severity: Secondary | ICD-10-CM

## 2013-01-21 LAB — GLUCOSE, CAPILLARY
Glucose-Capillary: 105 mg/dL — ABNORMAL HIGH (ref 70–99)
Glucose-Capillary: 118 mg/dL — ABNORMAL HIGH (ref 70–99)

## 2013-01-21 MED ORDER — ASPIRIN-DIPYRIDAMOLE ER 25-200 MG PO CP12: 1.0000 | ORAL_CAPSULE | Freq: Two times a day (BID) | ORAL | Status: DC

## 2013-01-21 MED ORDER — ACETAMINOPHEN 325 MG PO TABS: 650.0000 mg | ORAL_TABLET | ORAL | Status: DC | PRN

## 2013-01-21 MED ORDER — INSULIN GLARGINE 100 UNIT/ML ~~LOC~~ SOLN: 25.0000 [IU] | Freq: Every day | SUBCUTANEOUS | Status: DC

## 2013-01-21 MED ORDER — LISINOPRIL 20 MG PO TABS: 20.0000 mg | ORAL_TABLET | Freq: Every day | ORAL | Status: DC

## 2013-01-21 MED ORDER — SIMVASTATIN 80 MG PO TABS: 80.0000 mg | ORAL_TABLET | Freq: Every evening | ORAL | Status: DC

## 2013-01-21 NOTE — Progress Notes (Signed)
Patient ID: [name], female   DOB: February 19, 1956, 57 y.o.   MRN: [id number] 57 y.o. 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 Patient used rolling walker as well as a left prosthesis prior to admission. Admitted 01/10/2013 with right-sided weakness. MRI of the brain showed acute nonhemorrhagic infarct involving the left putamen. Remote lacunar infarcts in the basal ganglia bilaterally as well as left cerebellum. MRA of the head was advanced small vessel disease moderate proximal basilar artery stenosis. Echocardiogram with ejection fraction of 60% grade 1 diastolic dysfunction. Patient with TEE as well as loop recorder in the past from prior CVA that was unremarkable. Carotid Dopplers with no ICA stenosis.  Subjective/Complaints: Slept ok  Review of Systems  All other systems reviewed and are negative.    Objective: Vital Signs: Blood pressure 125/69, pulse 72, temperature 97.7 F (36.5 C), temperature source Oral, resp. rate 19, height 5\' 6"  (1.676 m), weight 83.598 kg (184 lb 4.8 oz), SpO2 98.00%. No results found. Results for orders placed during the hospital encounter of 01/13/13 (from the past 72 hour(s))  GLUCOSE, CAPILLARY     Status: None   Collection Time    01/18/13 11:19 AM      Result Value Range   Glucose-Capillary 88  70 - 99 mg/dL   Comment 1 Notify RN    GLUCOSE, CAPILLARY     Status: Abnormal   Collection Time    01/18/13  4:27 PM      Result Value Range   Glucose-Capillary 106 (*) 70 - 99 mg/dL   Comment 1 Notify RN    GLUCOSE, CAPILLARY     Status: Abnormal   Collection Time    01/18/13  8:17 PM      Result Value Range   Glucose-Capillary 416 (*) 70 - 99 mg/dL  GLUCOSE, CAPILLARY     Status: Abnormal   Collection Time    01/18/13  9:35 PM      Result Value Range    Glucose-Capillary 155 (*) 70 - 99 mg/dL  GLUCOSE, CAPILLARY     Status: Abnormal   Collection Time    01/19/13  7:46 AM      Result Value Range   Glucose-Capillary 128 (*) 70 - 99 mg/dL   Comment 1 Notify RN    GLUCOSE, CAPILLARY     Status: None   Collection Time    01/19/13 11:44 AM      Result Value Range   Glucose-Capillary 98  70 - 99 mg/dL   Comment 1 Notify RN    URINALYSIS, ROUTINE W REFLEX MICROSCOPIC     Status: Abnormal   Collection Time    01/19/13  1:49 PM      Result Value Range   Color, Urine YELLOW  YELLOW   APPearance CLEAR  CLEAR   Specific Gravity, Urine 1.027  1.005 - 1.030   pH 5.5  5.0 - 8.0   Glucose, UA NEGATIVE  NEGATIVE mg/dL   Hgb urine dipstick NEGATIVE  NEGATIVE   Bilirubin Urine SMALL (*) NEGATIVE   Ketones, ur NEGATIVE  NEGATIVE mg/dL   Protein, ur NEGATIVE  NEGATIVE mg/dL   Urobilinogen, UA 1.0  0.0 - 1.0 mg/dL   Nitrite NEGATIVE  NEGATIVE   Leukocytes, UA TRACE (*) NEGATIVE  URINE MICROSCOPIC-ADD ON     Status: Abnormal   Collection Time  01/19/13  1:49 PM      Result Value Range   Squamous Epithelial / LPF FEW (*) RARE   WBC, UA 0-2  <3 WBC/hpf   Bacteria, UA RARE  RARE   Urine-Other MANY YEAST    GLUCOSE, CAPILLARY     Status: Abnormal   Collection Time    01/19/13  4:18 PM      Result Value Range   Glucose-Capillary 121 (*) 70 - 99 mg/dL  GLUCOSE, CAPILLARY     Status: Abnormal   Collection Time    01/19/13  8:03 PM      Result Value Range   Glucose-Capillary 157 (*) 70 - 99 mg/dL  CREATININE, SERUM     Status: None   Collection Time    01/20/13  5:46 AM      Result Value Range   Creatinine, Ser 0.75  0.50 - 1.10 mg/dL   GFR calc non Af Amer >90  >90 mL/min   GFR calc Af Amer >90  >90 mL/min   Comment: (NOTE)     The eGFR has been calculated using the CKD EPI equation.     This calculation has not been validated in all clinical situations.     eGFR's persistently <90 mL/min signify possible Chronic Kidney     Disease.   GLUCOSE, CAPILLARY     Status: None   Collection Time    01/20/13  7:35 AM      Result Value Range   Glucose-Capillary 86  70 - 99 mg/dL  GLUCOSE, CAPILLARY     Status: None   Collection Time    01/20/13 12:22 PM      Result Value Range   Glucose-Capillary 88  70 - 99 mg/dL  GLUCOSE, CAPILLARY     Status: Abnormal   Collection Time    01/20/13  5:08 PM      Result Value Range   Glucose-Capillary 123 (*) 70 - 99 mg/dL  GLUCOSE, CAPILLARY     Status: Abnormal   Collection Time    01/20/13  8:50 PM      Result Value Range   Glucose-Capillary 139 (*) 70 - 99 mg/dL  GLUCOSE, CAPILLARY     Status: Abnormal   Collection Time    01/21/13  7:04 AM      Result Value Range   Glucose-Capillary 105 (*) 70 - 99 mg/dL   Comment 1 Notify RN        Head: Normocephalic.  Eyes: EOM are normal.  Neck: Normal range of motion. Neck supple. No thyromegaly present.  Cardiovascular: Normal rate and regular rhythm.  Pulmonary/Chest: Effort normal and breath sounds normal. No respiratory distress.  Abdominal: Soft. Bowel sounds are normal. She exhibits no distension.  Neurological: She is alert and oriented to person, place, and time.  Follows full commands  Skin:  Left BKA site well healed  Psychiatric: She has a normal mood and affect.  motor strength: 4+/5 in the right deltoid, bicep, tricep, grip, hip flexor, knee extensors, ankle dyslexia plantar flexor  5/5 in the left deltoid, bicep, tricep, grip, hip flexor  Sensory intact to light touch in bilateral upper and reduced sensation to light touch right foot  Responses are slowed.    Assessment/Plan: 1. Functional deficits secondary to L putamen which require 3+ hours per day of interdisciplinary therapy in a comprehensive inpatient rehab setting. Physiatrist is providing close team supervision and 24 hour management of active medical problems listed below. Physiatrist and rehab team  continue to assess barriers to discharge/monitor patient  progress toward functional and medical goals.  FIM: FIM - Bathing Bathing Steps Patient Completed: Chest;Right Arm;Left Arm;Abdomen;Front perineal area;Buttocks;Right lower leg (including foot);Right upper leg;Left upper leg Bathing: 5: Supervision: Safety issues/verbal cues  FIM - Upper Body Dressing/Undressing Upper body dressing/undressing steps patient completed: Thread/unthread right sleeve of pullover shirt/dresss;Thread/unthread left sleeve of pullover shirt/dress;Put head through opening of pull over shirt/dress;Pull shirt over trunk;Thread/unthread right bra strap;Thread/unthread left bra strap;Hook/unhook bra Upper body dressing/undressing: 5: Supervision: Safety issues/verbal cues FIM - Lower Body Dressing/Undressing Lower body dressing/undressing steps patient completed: Thread/unthread right underwear leg;Thread/unthread left underwear leg;Pull underwear up/down;Thread/unthread right pants leg;Thread/unthread left pants leg;Pull pants up/down;Fasten/unfasten right shoe;Don/Doff right shoe Lower body dressing/undressing: 5: Supervision: Safety issues/verbal cues  FIM - Toileting Toileting steps completed by patient: Adjust clothing prior to toileting;Performs perineal hygiene;Adjust clothing after toileting Toileting Assistive Devices: Grab bar or rail for support Toileting: 4: Steadying assist  FIM - Diplomatic Services operational officer Devices: Art gallery manager Transfers: 4-To toilet/BSC: Min A (steadying Pt. > 75%);4-From toilet/BSC: Min A (steadying Pt. > 75%)  FIM - Bed/Chair Transfer Bed/Chair Transfer Assistive Devices: Bed rails;Arm rests Bed/Chair Transfer: 5: Supine > Sit: Supervision (verbal cues/safety issues);5: Bed > Chair or W/C: Supervision (verbal cues/safety issues)  FIM - Locomotion: Wheelchair Distance: 150 Locomotion: Wheelchair: 5: Travels 150 ft or more: maneuvers on rugs and over door sills with supervision, cueing or coaxing FIM - Locomotion:  Ambulation Locomotion: Ambulation Assistive Devices: Designer, industrial/product Ambulation/Gait Assistance: 5: Supervision Locomotion: Ambulation: 5: Travels 150 ft or more with supervision/safety issues  Comprehension Comprehension Mode: Auditory Comprehension: 5-Follows basic conversation/direction: With no assist  Expression Expression Mode: Verbal Expression: 5-Expresses basic needs/ideas: With extra time/assistive device  Social Interaction Social Interaction: 6-Interacts appropriately with others with medication or extra time (anti-anxiety, antidepressant).  Problem Solving Problem Solving: 5-Solves basic problems: With no assist  Memory Memory: 6-More than reasonable amt of time  Medical Problem List and Plan:  1. 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 DVT Prophylaxis/Anticoagulation: Subcutaneous Lovenox. Monitor platelet counts and any signs of bleeding  3. Pain Management: Tylenol as needed  4. Neuropsych: This patient is capable of making decisions on her own behalf.  5. Hypertension. Lisinopril 20 mg daily. Monitor with increased activity  6. Diabetes mellitus with peripheral neuropathy. Controlled Hemoglobin A1c 9.2. Lantus insulin 25 units each bedtime. Check CBGs a.c. and at bedtime, 7. Hyperlipidemia. Zocor   LOS (Days) 8 A FACE TO FACE EVALUATION WAS PERFORMED  Lubertha Leite E 01/21/2013, 8:12 AM

## 2013-01-21 NOTE — Discharge Summary (Signed)
Discharge summary job [id number]

## 2013-01-21 NOTE — Progress Notes (Signed)
Social Work Patient ID: [name], female   DOB: [birth year], 57 y.o.   MRN: [id number] Spoke with niece and she and pt's other sister's plan to come tonight to talk with pt about the concerns and her safety at home.  Aware pt will need to agree to going to NH and not home. Pt continues to feel she can manage at home and is not much different than she was before this.  She refuses to go to a NH.  Aware of Home Health SW and if concerns then possibility of APS becoming involved.  She is aware and feels nothing will come up.

## 2013-01-21 NOTE — Progress Notes (Signed)
Social Work Patient ID: [name], female   DOB: [birth year], 57 y.o.   MRN: [id number] Spoke with niece again to inform her that MD feels pt is ready for discharge and she has met her therapy goals. The plan is to discharge her tomorrow with the recommendation of 24 hour supervision.  Have completed an FL2 and will set out To area NH if pt approves, to assist in finding a NH bed.  Follow up home health therapies arranged to monitor her at home along with a  Home health social worker.  Main issue is medication management and safety concerns.  Niece is aware and will discuss with aunt's. If she has private  Funds then could go to assisting living facility also.  Aware this worker will assist if needed.  Pt continues to plan to go home tomorrow.

## 2013-01-21 NOTE — Progress Notes (Signed)
Physical Therapy Session Note  Patient Details  Name: [name] MRN: [id number] Date of Birth: Oct 15, 1955  Today's Date: 01/21/2013 Time: 1478-2956 Time Calculation (min): 55 min  Short Term Goals: Week 1:  PT Short Term Goal 1 (Week 1): STG=LTG due to short LOS, S overall  Skilled Therapeutic Interventions/Progress Updates:    pt sitting EOB finishing breakfast.  Pt notes no pain.  Pt needed A to don ted hose on R LE, but was independent with R shoe and L prosthetic.  Sit to stands with S.  Amb with RW S 120' and 150'.  Occasional cues for negotiating around obstacles and management of RW.  Stair gait with L rail and MinA up/down 4 steps with cues for technique and safety.  W/C mobility with Bil UEs with S >150'.  Propelled around cones, through narrow spaces and picking up objects from floor.  Cues needed to avoid obstacles and MinA needed for management of leg rests.  Returned to bed with S and Mod I for sit to supine and supine to sit with use of rail.  Pt left sitting EOB.    Therapy Documentation Precautions:  Precautions Precautions: Fall Precaution Comments: CVA, L prosthetic limb Required Braces or Orthoses: Other Brace/Splint Other Brace/Splint: LLE prosthesis Restrictions Weight Bearing Restrictions: No  See FIM for current functional status  Therapy/Group: Individual Therapy  Sharie Amorin, Alison Murray 01/21/2013, 8:48 AM

## 2013-01-21 NOTE — Progress Notes (Signed)
Physical Therapy Discharge Summary  Patient Details  Name: [name] MRN: [id number] Date of Birth: [birth year]  Patient has met 9 of 9 long term goals due to improved activity tolerance, improved balance, increased strength, ability to compensate for deficits and improved awareness.  Patient to discharge at an ambulatory level Supervision.   Patient's care partner will likely be intermittently available to provide the necessary cognitive assistance at discharge, however therapy team stressed importance of 24/7 S for safety.   Recommendation:  Patient will benefit from ongoing skilled PT services in home health setting to continue to advance safe functional mobility, address ongoing impairments in strength, balance, and minimize fall risk.  Reasons for discharge: treatment goals met and discharge from hospital  Patient/family agrees with progress made and goals achieved: Yes  PT Discharge Precautions/Restrictions   Vital Signs Therapy Vitals Temp: 97.7 F (36.5 C) Temp src: Oral Pulse Rate: 72 Resp: 19 BP: 125/69 mmHg Patient Position, if appropriate: Lying Oxygen Therapy SpO2: 98 % O2 Device: None (Room air) Pain Pain Assessment Pain Assessment: No/denies pain    Mobility Bed Mobility Bed Mobility: Supine to Sit Supine to Sit: 6: Modified independent (Device/Increase time) Sitting - Scoot to Edge of Bed: 6: Modified independent (Device/Increase time) Sit to Supine: 6: Modified independent (Device/Increase time) Transfers Transfers: Yes Sit to Stand: 5: Supervision Stand to Sit: 5: Supervision Stand Pivot Transfers: 5: Supervision Locomotion  Ambulation Ambulation: Yes Ambulation/Gait Assistance: 5: Supervision Ambulation Distance (Feet): 150 Feet Assistive device: Rolling walker Ambulation/Gait Assistance Details: cues for negotiating around obstacles and managing RW.   Gait Gait Pattern: Step-through pattern;Decreased stride length;Decreased stance time -  left Stairs / Additional Locomotion Stairs Assistance: 4: Min assist Stairs Assistance Details: Verbal cues for technique;Verbal cues for precautions/safety Stair Management Technique: One rail Left;Forwards Number of Stairs: 4 Corporate treasurer: Yes Wheelchair Assistance: 5: Investment banker, operational: Both upper extremities Wheelchair Parts Management: Needs assistance Distance: 150  Trunk/Postural Assessment  Cervical Assessment Cervical Assessment: Within Lobbyist Thoracic Assessment: Within Functional Limits Lumbar Assessment Lumbar Assessment: Within Functional Limits Postural Control Postural Control: Within Functional Limits  Balance Dynamic Sitting Balance Dynamic Sitting - Balance Support: Feet supported Dynamic Sitting - Level of Assistance: 5: Stand by assistance Dynamic Sitting - Balance Activities: Lateral lean/weight shifting;Reaching for objects Static Standing Balance Static Standing - Balance Support: Bilateral upper extremity supported Static Standing - Level of Assistance: 5: Stand by assistance Dynamic Standing Balance Dynamic Standing - Balance Support: Bilateral upper extremity supported Dynamic Standing - Level of Assistance: 5: Stand by assistance Extremity Assessment      RLE Assessment RLE Assessment: Exceptions to [facility] RLE Strength RLE Overall Strength Comments: 4/5 Throughout LLE Assessment LLE Assessment: Exceptions to [facility] LLE Strength LLE Overall Strength Comments: L BKA demos 4-/5 grossly  See FIM for current functional status  Ritenour, Megan F  Elleah Hemsley, PT, DPT  01/21/2013, 8:37 AM

## 2013-01-21 NOTE — Progress Notes (Signed)
Occupational Therapy Discharge Summary  Patient Details  Name: AZADEH HYDER MRN: [id number] Date of Birth: 1955-08-20  Today's Date: 01/21/2013  Patient has met 8 of 11 long term goals due to improved activity tolerance, improved balance, ability to compensate for deficits, improved attention, improved awareness and improved coordination.  Patient to discharge at overall Supervision level.  Patient's care partner is independent to provide the necessary physical and cognitive assistance at discharge.    Reasons goals not met: Pt had mod I goals for UB dressing, toileting, and toilet transfers.  She continues to require set up with UB dressing and supervision with toileting and toilet and/ or BSC transfers.    Recommendation:  Patient will benefit from ongoing skilled OT services in home health setting to continue to advance functional skills in the area of BADL.  Equipment: No equipment provided  Reasons for discharge: treatment goals met  Patient/family agrees with progress made and goals achieved: Yes  OT Discharge ADL  supervision overall - refer to FIM Vision/Perception  Vision - History Patient Visual Report: No change from baseline Vision - Assessment Eye Alignment: Within Functional Limits Perception Perception: Within Functional Limits Praxis Praxis: Intact  Cognition Overall Cognitive Status: Within Functional Limits for tasks assessed Orientation Level: Oriented X4 Sensation Sensation Light Touch: Appears Intact Stereognosis: Appears Intact Hot/Cold: Appears Intact Coordination Gross Motor Movements are Fluid and Coordinated: Yes Fine Motor Movements are Fluid and Coordinated: Yes Motor  Motor Motor: Within Functional Limits Mobility  Bed Mobility Bed Mobility: Supine to Sit Supine to Sit: 6: Modified independent (Device/Increase time) Sitting - Scoot to Edge of Bed: 6: Modified independent (Device/Increase time) Sit to Supine: 6: Modified independent  (Device/Increase time) Transfers Sit to Stand: 5: Supervision Stand to Sit: 5: Supervision  Trunk/Postural Assessment  Cervical Assessment Cervical Assessment: Within Functional Limits Thoracic Assessment Thoracic Assessment: Within Functional Limits Lumbar Assessment Lumbar Assessment: Within Functional Limits Postural Control Postural Control: Within Functional Limits  Balance Static Sitting Balance Static Sitting - Level of Assistance: 7: Independent Dynamic Sitting Balance Dynamic Sitting - Balance Support: Feet supported Dynamic Sitting - Level of Assistance: 6: Modified independent (Device/Increase time) Dynamic Sitting - Balance Activities: Lateral lean/weight shifting;Reaching for objects Static Standing Balance Static Standing - Balance Support: Bilateral upper extremity supported Static Standing - Level of Assistance: 5: Stand by assistance Dynamic Standing Balance Dynamic Standing - Balance Support: Bilateral upper extremity supported Dynamic Standing - Level of Assistance: 5: Stand by assistance Extremity/Trunk Assessment RUE Assessment RUE Assessment: Within Functional Limits LUE Assessment LUE Assessment: Within Functional Limits  See FIM for current functional status  SAGUIER,JULIA 01/21/2013, 12:19 PM

## 2013-01-21 NOTE — Progress Notes (Signed)
Occupational Therapy Session Notes  Patient Details  Name: CASSIA FEIN MRN: [id number] Date of Birth: 12/04/1955  Today's Date: 01/21/2013 Time: 0900-1000 and 215-300 Time Calculation (min): 60 min and 45 min  Short Term Goals: Week 1:  OT Short Term Goal 1 (Week 1): N/A secondary to ELOS  Skilled Therapeutic Interventions/Progress Updates:  1)  Patient resting in bed upon arrival with niece at her bedside.  Daughter had to leave and did not participate in caregiver education stating that she had already been present for 2 self care sessions with Julia-primary OT.  Patient engaged in self care retraining to include shower, dress and groom.  Patient ambulated with RW and prosthetic to tub bench, doffed prosthetic after seated, scooted into position, bathed while seated and stood using grab bar for peri area and buttocks.  Patient dressed and donned prosthetic seated edge of tub bench and ambulated back to w/c then propelled self to her hospital room.  Patient was supervision and vcs for all above tasks.  2)  Patient resting in recliner upon arrival.  Engaged in squat pivot transfers Recliner>w/c><BSC, w/c><recliner.  Patient required supervision and verbal cues for w/c position, safety and controled descent as patient will plop down unless vc is provided.  W/c tilted backward when she plopped into it and anti tippers kept the w/c from tipping.  Patient unaware that she was wet from urine incontinence with underware and pants soaked.  Patient removed pants then said that she did not think her underware were wet and did not initially plan to remove them then reluctantly did remove them upon this OT's suggestion.  Patient changed into hospital gown and stood at sink to wash peri area.  Issued theraband and patient performed 2 sets of 10 shoulder and bicep/tricep exercises.  Therapy Documentation Precautions:  Precautions Precautions: Fall Precaution Comments: CVA, L prosthetic limb Required  Braces or Orthoses: Other Brace/Splint Other Brace/Splint: LLE prosthesis Restrictions Weight Bearing Restrictions: No Pain: Denies pain in both sessions ADL: See FIM for current functional status  Therapy/Group: Individual Therapy for both sessions  Yasira Engelson 01/21/2013, 9:37 AM

## 2013-01-21 NOTE — Plan of Care (Signed)
Problem: RH KNOWLEDGE DEFICIT Goal: RH STG INCREASE KNOWLEDGE OF DIABETES Patient will be able to verbalize the signs and symptoms of hypoglycemia or hyperglycemia. Patient will be able to verbalize importance of management of insulin with demonstration of insulin injection as well diet and exercise.  Patient demonstrated knowledge on self injection with her insulin.

## 2013-01-21 NOTE — Discharge Summary (Signed)
[name], [name]              ACCOUNT NO.:  [id number]  MEDICAL RECORD NO.:  [id number]  LOCATION:  4M03C                        FACILITY:  [facility]  PHYSICIAN:  Erick Colace, M.D.DATE OF BIRTH:  [birth year]  DATE OF ADMISSION:  01/13/2013 DATE OF DISCHARGE:  01/22/2013                              DISCHARGE SUMMARY   DISCHARGE DIAGNOSES: 1. 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 2. 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 3. Subcutaneous Lovenox for deep venous thrombosis prophylaxis. 4. Hypertension. 5. Diabetes mellitus with peripheral neuropathy. 6. Hyperlipidemia.  HISTORY OF PRESENT ILLNESS:  This is a 57 year old right-handed female with history of diabetes mellitus, peripheral neuropathy, CVA in February 2014 as well as left below-the-knee amputation in August 2013 received inpatient rehab services at that time.  The patient used a rolling walker as well as left prosthesis prior to admission.  She was admitted on January 10, 2013, with right-sided weakness.  MRI of the brain showed acute nonhemorrhagic infarct involving the left putamen. Remote lacunar infarcts in the basal ganglia bilaterally as well as left cerebellum.  MRA of the head with advanced small vessel disease, moderate proximal basilar artery stenosis.  Echocardiogram with ejection fraction of 60% grade 1 diastolic dysfunction.  The patient with a TEE as well as a loop recorder in the past from prior CVA that was unremarkable.  Carotid Dopplers with no ICA stenosis.  The patient did not receive tPA.  Neurology Services consulted, maintained on Aggrenox therapy for CVA prophylaxis increased to twice daily.  Subcutaneous Lovenox for DVT prophylaxis.  Physical and occupational therapy ongoing. The patient was admitted for comprehensive rehab program.  PAST MEDICAL HISTORY:  See discharge diagnoses.  SOCIAL HISTORY:  Lives alone.  FUNCTIONAL HISTORY:  Prior to admission, used  a rolling walker and prosthesis.  Functional status upon admission to rehab services minimal assist to scoot to the edge of the bed.  PHYSICAL EXAMINATION:  VITAL SIGNS:  Blood pressure 159/81, pulse 80, temperature 98, respirations 18. GENERAL:  This was an alert female, oriented x3. HEENT:  Pupils round and reactive to light. LUNGS:  Clear to auscultation. CARDIAC:  Regular rate and rhythm. ABDOMEN:  Soft, nontender.  Good bowel sounds.  BKA site well healed.  REHABILITATION HOSPITAL COURSE:  The patient was admitted to Inpatient Rehab Services with therapies initiated on a 3-hour daily basis consisting of physical therapy, occupational therapy, and rehabilitation nursing.  The following issues were addressed during the patient's rehabilitation stay.  Pertaining to Ms. Spano's left putamen infarction felt to be thrombotic, remained stable followed by Neurology Services. Maintained on Aggrenox therapy.  She continued on subcutaneous Lovenox throughout her rehab course for DVT prophylaxis.  The patient had a history of left below-knee amputation, which was well healed from August 2013 using prosthesis.  Blood pressures controlled on lisinopril with no orthostatic changes.  She did have a history of diabetes mellitus with peripheral neuropathy.  Hemoglobin A1c 9.2.  She remained on insulin therapy with ongoing diabetic teaching.  Zocor for hyperlipidemia.  The patient received weekly collaborative interdisciplinary team conferences to discuss estimated length of stay, family teaching, and any barriers to her discharge.  She was continent of bowel and bladder.  Minimal assist for toileting and tub bench transfers with a walker, supervision with wheelchair, steadying assist with bathing, dressing, toileting; supervision with moderate cues overall for functional mobility.  Overall her strength and endurance greatly improved.  She was encouraged with overall progress.  Plan was to be  discharged to home after family teaching completed.  DISCHARGE MEDICATIONS: 1. Aggrenox 1 capsule p.o. b.i.d. 2. Lantus insulin 25 units subcutaneously at bedtime. 3. Lisinopril 20 mg p.o. daily. 4. Zocor 40 mg p.o. daily.  DIET:  Diabetic diet.  SPECIAL INSTRUCTIONS:  The patient would follow up Dr. Claudette Laws at the outpatient rehab service office on February 17, 2013; Dr. Delia Heady, Neurology Services 1 month, call for appointment; Dr. Wynn Maudlin, medical management appointment to be made.  Ongoing therapies were arranged as per rehab services.     [name], P.A.   ______________________________ Erick Colace, M.D.    DA/MEDQ  D:  01/21/2013  T:  01/21/2013  Job:  [id number]  cc:   Pramod P. Pearlean Brownie, MD Dr. Vedia Pereyra

## 2013-01-21 NOTE — Progress Notes (Signed)
Social Work Patient ID: [name], female   DOB: 12/17/1955, 57 y.o.   MRN: [id number] Met with niece who reports she has spoken with Aunt and she still feels she will manage at home.  Niece also has concerns and plans to speak with great Aunt, a Person pt listens to.  Gave niece list of area NH and she is aware pt will need to agree to this plan.  Pt does have some private funds to pay for this.  Informed niece will get HHSW and if  Pt is not managing well at home APS will be contacted to pursue.  Niece at a loss due to pt's stubbornness.

## 2013-01-21 NOTE — Progress Notes (Signed)
Occupational Therapy Session Note  Patient Details  Name: [name] MRN: [id number] Date of Birth: 31-Jul-1955  Today's Date: 01/21/2013 Time: 1100-1130 Time Calculation (min): 30 min  Short Term Goals: Week 1:  OT Short Term Goal 1 (Week 1): N/A secondary to ELOS  Skilled Therapeutic Interventions/Progress Updates:    Pt seen to review bed to [facility] transfers to see if she could meet the LTG of mod I. Pt practiced this transfer 3x but really needed supervision.  Discussed the need for her to have full supervision from her family at home. Pt then worked on UE exercises using weighted dowel bar focusing on shoulder strength.  Pt resting in recliner at end of session.  Therapy Documentation Precautions:  Precautions Precautions: Fall Precaution Comments: CVA, L prosthetic limb Required Braces or Orthoses: Other Brace/Splint Other Brace/Splint: LLE prosthesis Restrictions Weight Bearing Restrictions: No    Pain: Pain Assessment Pain Assessment: No/denies pain ADL:  See FIM for current functional status  Therapy/Group: Individual Therapy  SAGUIER,JULIA 01/21/2013, 12:05 PM

## 2013-01-22 DIAGNOSIS — I739 Peripheral vascular disease, unspecified: Secondary | ICD-10-CM

## 2013-01-22 DIAGNOSIS — S88119A Complete traumatic amputation at level between knee and ankle, unspecified lower leg, initial encounter: Secondary | ICD-10-CM

## 2013-01-22 DIAGNOSIS — L98499 Non-pressure chronic ulcer of skin of other sites with unspecified severity: Secondary | ICD-10-CM

## 2013-01-22 DIAGNOSIS — I6330 Cerebral infarction due to thrombosis of unspecified cerebral artery: Secondary | ICD-10-CM

## 2013-01-22 NOTE — Progress Notes (Signed)
1435 Pt. Discharged to home with family.  All discharge instructions provided by PA.  Escorted to vehicle by NT.  All personal belongings in tow.

## 2013-01-22 NOTE — Progress Notes (Signed)
Patient ID: [name], female   DOB: 11-05-1955, 57 y.o.   MRN: [id number] 57 y.o. 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 Patient used rolling walker as well as a left prosthesis prior to admission. Admitted 01/10/2013 with right-sided weakness. MRI of the brain showed acute nonhemorrhagic infarct involving the left putamen. Remote lacunar infarcts in the basal ganglia bilaterally as well as left cerebellum. MRA of the head was advanced small vessel disease moderate proximal basilar artery stenosis. Echocardiogram with ejection fraction of 60% grade 1 diastolic dysfunction. Patient with TEE as well as loop recorder in the past from prior CVA that was unremarkable. Carotid Dopplers with no ICA stenosis.  Subjective/Complaints: Ready to go home! No complaints  Review of Systems  All other systems reviewed and are negative.    Objective: Vital Signs: Blood pressure 144/76, pulse 76, temperature 98.7 F (37.1 C), temperature source Oral, resp. rate 19, height 5\' 6"  (1.676 m), weight 83.598 kg (184 lb 4.8 oz), SpO2 98.00%. No results found. Results for orders placed during the hospital encounter of 01/13/13 (from the past 72 hour(s))  GLUCOSE, CAPILLARY     Status: None   Collection Time    01/19/13 11:44 AM      Result Value Range   Glucose-Capillary 98  70 - 99 mg/dL   Comment 1 Notify RN    URINALYSIS, ROUTINE W REFLEX MICROSCOPIC     Status: Abnormal   Collection Time    01/19/13  1:49 PM      Result Value Range   Color, Urine YELLOW  YELLOW   APPearance CLEAR  CLEAR   Specific Gravity, Urine 1.027  1.005 - 1.030   pH 5.5  5.0 - 8.0   Glucose, UA NEGATIVE  NEGATIVE mg/dL   Hgb urine dipstick NEGATIVE  NEGATIVE   Bilirubin Urine SMALL (*) NEGATIVE   Ketones, ur NEGATIVE  NEGATIVE mg/dL    Protein, ur NEGATIVE  NEGATIVE mg/dL   Urobilinogen, UA 1.0  0.0 - 1.0 mg/dL   Nitrite NEGATIVE  NEGATIVE   Leukocytes, UA TRACE (*) NEGATIVE  URINE MICROSCOPIC-ADD ON     Status: Abnormal   Collection Time    01/19/13  1:49 PM      Result Value Range   Squamous Epithelial / LPF FEW (*) RARE   WBC, UA 0-2  <3 WBC/hpf   Bacteria, UA RARE  RARE   Urine-Other MANY YEAST    GLUCOSE, CAPILLARY     Status: Abnormal   Collection Time    01/19/13  4:18 PM      Result Value Range   Glucose-Capillary 121 (*) 70 - 99 mg/dL  GLUCOSE, CAPILLARY     Status: Abnormal   Collection Time    01/19/13  8:03 PM      Result Value Range   Glucose-Capillary 157 (*) 70 - 99 mg/dL  CREATININE, SERUM     Status: None   Collection Time    01/20/13  5:46 AM      Result Value Range   Creatinine, Ser 0.75  0.50 - 1.10 mg/dL   GFR calc non Af Amer >90  >90 mL/min   GFR calc Af Amer >90  >90 mL/min   Comment: (NOTE)     The eGFR has been calculated using the CKD EPI equation.     This calculation has not been  validated in all clinical situations.     eGFR's persistently <90 mL/min signify possible Chronic Kidney     Disease.  GLUCOSE, CAPILLARY     Status: None   Collection Time    01/20/13  7:35 AM      Result Value Range   Glucose-Capillary 86  70 - 99 mg/dL  GLUCOSE, CAPILLARY     Status: None   Collection Time    01/20/13 12:22 PM      Result Value Range   Glucose-Capillary 88  70 - 99 mg/dL  GLUCOSE, CAPILLARY     Status: Abnormal   Collection Time    01/20/13  5:08 PM      Result Value Range   Glucose-Capillary 123 (*) 70 - 99 mg/dL  GLUCOSE, CAPILLARY     Status: Abnormal   Collection Time    01/20/13  8:50 PM      Result Value Range   Glucose-Capillary 139 (*) 70 - 99 mg/dL  GLUCOSE, CAPILLARY     Status: Abnormal   Collection Time    01/21/13  7:04 AM      Result Value Range   Glucose-Capillary 105 (*) 70 - 99 mg/dL   Comment 1 Notify RN    GLUCOSE, CAPILLARY     Status:  Abnormal   Collection Time    01/21/13 11:25 AM      Result Value Range   Glucose-Capillary 113 (*) 70 - 99 mg/dL   Comment 1 Notify RN    GLUCOSE, CAPILLARY     Status: Abnormal   Collection Time    01/21/13  4:47 PM      Result Value Range   Glucose-Capillary 118 (*) 70 - 99 mg/dL  GLUCOSE, CAPILLARY     Status: Abnormal   Collection Time    01/21/13  8:28 PM      Result Value Range   Glucose-Capillary 146 (*) 70 - 99 mg/dL  GLUCOSE, CAPILLARY     Status: None   Collection Time    01/22/13  7:01 AM      Result Value Range   Glucose-Capillary 87  70 - 99 mg/dL   Comment 1 Notify RN        Head: Normocephalic.  Eyes: EOM are normal.  Neck: Normal range of motion. Neck supple. No thyromegaly present.  Cardiovascular: Normal rate and regular rhythm.  Pulmonary/Chest: Effort normal and breath sounds normal. No respiratory distress.  Abdominal: Soft. Bowel sounds are normal. She exhibits no distension.  Neurological: She is alert and oriented to person, place, and time.  Follows full commands  Skin:  Left BKA site well healed  Psychiatric: She has a normal mood and affect.  motor strength: 4+/5 in the right deltoid, bicep, tricep, grip, hip flexor, knee extensors, ankle dyslexia plantar flexor  5/5 in the left deltoid, bicep, tricep, grip, hip flexor  Sensory intact to light touch in bilateral upper and reduced sensation to light touch right foot  Responses are slowed.  Good trunk control, sitting eob without prosthesis  Assessment/Plan: 1. Functional deficits secondary to L putamen which require 3+ hours per day of interdisciplinary therapy in a comprehensive inpatient rehab setting. Physiatrist is providing close team supervision and 24 hour management of active medical problems listed below. Physiatrist and rehab team continue to assess barriers to discharge/monitor patient progress toward functional and medical goals.  FIM: FIM - Bathing Bathing Steps Patient  Completed: Chest;Right Arm;Abdomen;Front perineal area;Buttocks;Right lower leg (including foot);Right upper leg;Left upper  leg;Left Arm Bathing: 5: Supervision: Safety issues/verbal cues  FIM - Upper Body Dressing/Undressing Upper body dressing/undressing steps patient completed: Thread/unthread right sleeve of pullover shirt/dresss;Thread/unthread left sleeve of pullover shirt/dress;Put head through opening of pull over shirt/dress;Pull shirt over trunk;Thread/unthread right bra strap;Thread/unthread left bra strap;Hook/unhook bra Upper body dressing/undressing: 5: Supervision: Safety issues/verbal cues FIM - Lower Body Dressing/Undressing Lower body dressing/undressing steps patient completed: Thread/unthread right underwear leg;Thread/unthread left underwear leg;Pull underwear up/down;Thread/unthread right pants leg;Thread/unthread left pants leg;Pull pants up/down;Fasten/unfasten right shoe;Don/Doff right shoe Lower body dressing/undressing: 5: Set-up assist to: Don/Doff AFO/prosthesis/orthosis  FIM - Toileting Toileting steps completed by patient: Adjust clothing prior to toileting;Performs perineal hygiene;Adjust clothing after toileting Toileting Assistive Devices: Grab bar or rail for support Toileting: 4: Steadying assist  FIM - Diplomatic Services operational officer Devices: Art gallery manager Transfers: 4-To toilet/BSC: Min A (steadying Pt. > 75%);4-From toilet/BSC: Min A (steadying Pt. > 75%)  FIM - Bed/Chair Transfer Bed/Chair Transfer Assistive Devices: Arm rests Bed/Chair Transfer: 6: More than reasonable amt of time;5: Bed > Chair or W/C: Supervision (verbal cues/safety issues)  FIM - Locomotion: Wheelchair Distance: 150 Locomotion: Wheelchair: 5: Travels 150 ft or more: maneuvers on rugs and over door sills with supervision, cueing or coaxing FIM - Locomotion: Ambulation Locomotion: Ambulation Assistive Devices: Designer, industrial/product Ambulation/Gait Assistance: 5:  Supervision Locomotion: Ambulation: 5: Travels 150 ft or more with supervision/safety issues  Comprehension Comprehension Mode: Auditory Comprehension: 5-Follows basic conversation/direction: With no assist  Expression Expression Mode: Verbal Expression: 5-Expresses basic needs/ideas: With no assist  Social Interaction Social Interaction: 6-Interacts appropriately with others with medication or extra time (anti-anxiety, antidepressant).  Problem Solving Problem Solving: 5-Solves basic problems: With no assist  Memory Memory: 6-More than reasonable amt of time  Medical Problem List and Plan:  1. 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 DVT Prophylaxis/Anticoagulation: Subcutaneous Lovenox. Monitor platelet counts and any signs of bleeding  3. Pain Management: Tylenol as needed  4. Neuropsych: This patient is capable of making decisions on her own behalf.  5. Hypertension. Lisinopril 20 mg daily. Monitor with increased activity  6. Diabetes mellitus with peripheral neuropathy. Controlled Hemoglobin A1c 9.2. Lantus insulin 25 units each bedtime. Check CBGs a.c. and at bedtime, 7. Hyperlipidemia. Zocor   LOS (Days) 9 A FACE TO FACE EVALUATION WAS PERFORMED  Alisan Dokes T 01/22/2013, 8:08 AM

## 2013-01-22 NOTE — Consult Note (Signed)
NEUROCOGNITIVE STATUS EXAMINATION - [facility]   [name] is a 57 year old, right-handed woman, who was seen for a neurocognitive status examination to assess her cognitive and emotional functioning and to determine level of competency.  According to her medical record, she previously received rehabilitative services 01/16/12-01/27/12 for left below-knee amputation.  She was admitted on 01/10/13 with right-sided weakness.  MRI of the brain demonstrated acute nonhemorrhagic infarct involving the left putamen.  Remote lacunar infarcts in the basal ganglia were also seen bilaterally.  She did not receive TPA.    Emotional Functioning:  During the clinical interview, Ms. Sprecher denied depression, anxiety and suicidal ideation.  She reported sleeping well and having sufficient appetite.  She denied having any concerns at the current time.  Ms. Orne responses to a measure of mood symptoms were not suggestive of the presence of significant depression.    Mental Status:  Ms. Terwilliger total score on an overall measure of mental status was not suggestive of the presence of dementia (MMSE-2 brief = 15/16).  She lost one point for failing to recall one of three previously studied words.  However, on a measure assessing her ability to make sound judgments in everyday situations, Ms. Parma's total score was severely impaired (< 1st percentile).  On occasion, she provided inappropriate responses (e.g. when asking what to do if she heard that social security payments would be reduced for certain recipients, her response was "freak out").  Additionally, her responses were incomplete, as she would occasionally provide a very basic solution to a problem, but was unable to provide details on how to solve problems.  Following this formal measure of judgment, the examiner asked her to explain how she would manage her medications post-discharge.  She said, "I will take them," but  was unable to specifically say what system she will use to remember to take her medications and she could not say what medications she will need to take in the morning and at night, etc.     Impressions and Recommendations:  Ms. Wixon overall mental status appeared to be intact on a very brief screening instrument, but her ability to make sound judgments appears to be significantly impaired.  There is tremendous concern that she may not be cognitively competent enough to manage her care post-discharge and it is my recommendation that she not reside independently post-discharge.  At minimum, someone needs to be able to check her blood sugar and administer medications to ensure her safety.  At this time, there does not appear to be significant mood disruption that is impacting her cognitive functioning.    DIAGNOSES: CVA  Leavy Cella, Psy.D.  Clinical Neuropsychologist

## 2013-01-24 ENCOUNTER — Telehealth: Payer: Self-pay

## 2013-01-24 NOTE — Telephone Encounter (Signed)
[facility] called and says patient has been dropped by her insurance.  Agrenox can be split into 2 different medications (dipyridamole  25 and asa 200mg ) it would be cheaper for patient to have the 2 separate medications.  Pharmacy wanted to see if this was ok.

## 2013-01-24 NOTE — Progress Notes (Signed)
Social Work Discharge Note Discharge Note  The overall goal for the admission was met for:   Discharge location: Yes- HOME WITH FAMILY CHECKING INTERMMITTENTLY-RECOMMEDATION IS 24 HR SUPERVISION  Length of Stay: Yes- 8 DAYS  Discharge activity level: Yes-SUPERVISION LEVEL FOR SAFETY  Home/community participation: Yes  Services provided included: MD, RD, PT, OT, SLP, RN, Pharmacy, Neuropsych and SW  Financial Services: Other: PENDING MEDICAID  Follow-up services arranged: Home Health: [facility]-RN,PT,OT,SPT,SW and Patient/Family has no preference for HH/DME agencies  Comments (or additional information):RECOMMENDATION OF 24 HR SUPERVISION DUE TO SEVERAL SAFETY ISSUES.  FAMILY TO WORK ON-NHP OFFERED AND REFUSED. PT'S MEDICAID PENDING AND SSD APPLICATION GIVEN TO PT TO COMPLETE WITH FAMILY.  AWARE APS WILL BECOME INVOLVED IF PT NEGLECTING HERSELF AT HOME. FAMILY TRIED TO TALK PT INTO NHP, REFUSED.  NEURO-PSYCH INVOLVED POOR JUDGEMENT  Patient/Family verbalized understanding of follow-up arrangements: Yes  Individual responsible for coordination of the follow-up plan: TAMEKA-NIECE & PT  Confirmed correct DME delivered: Lucy Chris 01/24/2013    Lucy Chris

## 2013-01-24 NOTE — Telephone Encounter (Signed)
This is ok

## 2013-01-25 NOTE — Telephone Encounter (Signed)
Called [facility] to let them know that it was okay to split the Agrenox RX.

## 2013-01-25 NOTE — Telephone Encounter (Signed)
Called and left voicemail for

## 2013-02-17 ENCOUNTER — Inpatient Hospital Stay: Payer: Managed Care, Other (non HMO) | Admitting: Physical Medicine & Rehabilitation

## 2013-02-17 ENCOUNTER — Encounter: Payer: Self-pay | Attending: Physical Medicine & Rehabilitation

## 2013-04-19 ENCOUNTER — Encounter (HOSPITAL_COMMUNITY): Payer: Self-pay | Admitting: Emergency Medicine

## 2013-04-19 ENCOUNTER — Emergency Department (HOSPITAL_COMMUNITY)
Admission: EM | Admit: 2013-04-19 | Discharge: 2013-04-20 | Disposition: A | Discharge status: Home / Self Care | Payer: Self-pay | Attending: Emergency Medicine | Admitting: Emergency Medicine

## 2013-04-19 DIAGNOSIS — I10 Essential (primary) hypertension: Secondary | ICD-10-CM | POA: Insufficient documentation

## 2013-04-19 DIAGNOSIS — G609 Hereditary and idiopathic neuropathy, unspecified: Secondary | ICD-10-CM | POA: Insufficient documentation

## 2013-04-19 DIAGNOSIS — N390 Urinary tract infection, site not specified: Secondary | ICD-10-CM | POA: Insufficient documentation

## 2013-04-19 DIAGNOSIS — E119 Type 2 diabetes mellitus without complications: Secondary | ICD-10-CM | POA: Insufficient documentation

## 2013-04-19 DIAGNOSIS — K219 Gastro-esophageal reflux disease without esophagitis: Secondary | ICD-10-CM | POA: Insufficient documentation

## 2013-04-19 DIAGNOSIS — F17200 Nicotine dependence, unspecified, uncomplicated: Secondary | ICD-10-CM | POA: Insufficient documentation

## 2013-04-19 DIAGNOSIS — IMO0001 Reserved for inherently not codable concepts without codable children: Secondary | ICD-10-CM | POA: Insufficient documentation

## 2013-04-19 DIAGNOSIS — Z8669 Personal history of other diseases of the nervous system and sense organs: Secondary | ICD-10-CM | POA: Insufficient documentation

## 2013-04-19 DIAGNOSIS — S335XXA Sprain of ligaments of lumbar spine, initial encounter: Secondary | ICD-10-CM | POA: Insufficient documentation

## 2013-04-19 DIAGNOSIS — R51 Headache: Secondary | ICD-10-CM | POA: Insufficient documentation

## 2013-04-19 DIAGNOSIS — Z8679 Personal history of other diseases of the circulatory system: Secondary | ICD-10-CM | POA: Insufficient documentation

## 2013-04-19 DIAGNOSIS — X58XXXA Exposure to other specified factors, initial encounter: Secondary | ICD-10-CM | POA: Insufficient documentation

## 2013-04-19 DIAGNOSIS — Y929 Unspecified place or not applicable: Secondary | ICD-10-CM | POA: Insufficient documentation

## 2013-04-19 DIAGNOSIS — J329 Chronic sinusitis, unspecified: Secondary | ICD-10-CM

## 2013-04-19 DIAGNOSIS — E785 Hyperlipidemia, unspecified: Secondary | ICD-10-CM | POA: Insufficient documentation

## 2013-04-19 DIAGNOSIS — Y939 Activity, unspecified: Secondary | ICD-10-CM | POA: Insufficient documentation

## 2013-04-19 DIAGNOSIS — Z880 Allergy status to penicillin: Secondary | ICD-10-CM | POA: Insufficient documentation

## 2013-04-19 DIAGNOSIS — Z79899 Other long term (current) drug therapy: Secondary | ICD-10-CM | POA: Insufficient documentation

## 2013-04-19 DIAGNOSIS — K589 Irritable bowel syndrome without diarrhea: Secondary | ICD-10-CM | POA: Insufficient documentation

## 2013-04-19 DIAGNOSIS — S39012A Strain of muscle, fascia and tendon of lower back, initial encounter: Secondary | ICD-10-CM

## 2013-04-19 DIAGNOSIS — Z794 Long term (current) use of insulin: Secondary | ICD-10-CM | POA: Insufficient documentation

## 2013-04-19 DIAGNOSIS — Z8673 Personal history of transient ischemic attack (TIA), and cerebral infarction without residual deficits: Secondary | ICD-10-CM | POA: Insufficient documentation

## 2013-04-19 DIAGNOSIS — J019 Acute sinusitis, unspecified: Secondary | ICD-10-CM | POA: Insufficient documentation

## 2013-04-19 MED ORDER — KETOROLAC TROMETHAMINE 60 MG/2ML IM SOLN
60.0000 mg | Freq: Once | INTRAMUSCULAR | Status: AC
Start: 2013-04-19 — End: 2013-04-20
  Administered 2013-04-20: 60 mg via INTRAMUSCULAR
  Filled 2013-04-19: qty 2

## 2013-04-19 MED ORDER — TRAMADOL HCL 50 MG PO TABS
50.0000 mg | ORAL_TABLET | Freq: Once | ORAL | Status: AC
  Administered 2013-04-20: 50 mg via ORAL
  Filled 2013-04-19: qty 1

## 2013-04-19 NOTE — ED Provider Notes (Signed)
CSN: [id number]     Arrival date & time 04/19/13  2115 History   First MD Initiated Contact with Patient 04/19/13 2306     Chief Complaint  Patient presents with  . Back Pain   (Consider location/radiation/quality/duration/timing/severity/associated sxs/prior Treatment) HPI Patient states she woke "Sunday morning with a frontal headache and right lumbar pain. Patient denies recent URI symptoms. She says the headache is worse when bending forward. She has no neck pain or stiffness. Chest no head trauma. She states she's had similar headaches but this has lasted longer than normal. She denies any nausea or photophobia. She has been taking Tylenol at home for pain with little relief.  She also complains of right sided lumbar pain. The pain does not radiate. It is worse with movement and twisting. She has no bowel or bladder incontinence. She has no trauma to the area. Denies any heavy lifting. She has no new weakness or numbness.   Past Medical History  Diagnosis Date  . Atypical chest pain     a. non obstructive by cath in 2008 and 2010;  b. 02/2012 Myoview: non-ischemic, EF 57%  . HTN (hypertension)   . Hyperlipidemia   . Syncope and collapse     a. near-syncopal episode in November 2008;  b. 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  . CVA (cerebral infarction)     a.  Small right parietal noted incidentally 04/2007;  b. right sided embolic CVA 05/2012;  c. 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 Peripheral neuropathy   . Obesity, unspecified   . Esophageal reflux   . Diabetes mellitus, type II   . Diaphragmatic hernia without mention of obstruction or gangrene   . Irritable bowel syndrome   . Morbid obesity   . Cellulitis     a. 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 Stroke     20" 13-'no residual'  . Vertigo   . Dry skin   . Constipation   . Tobacco abuse    Past Surgical History  Procedure  Laterality Date  . Invasive electrophysiologic study  5/09    followed by insertion of an implantable loop recorder. S/p removal   . Colonoscopy    . Cholecystectomy    . Multiple toe surgeries    . Cardiac catheterization      LAD 30%, circumflex 50%, OM 75%, RI 60% with small branch 80%, dominant RCA 60%, EF 45-50%  . I&d extremity  12/23/2011    Procedure: IRRIGATION AND DEBRIDEMENT EXTREMITY;  Surgeon: Verlee Rossetti, MD;  Location: [facility] OR;  Service: Orthopedics;  Laterality: Left;  Left Foot  . I&d extremity  12/26/2011    Procedure: IRRIGATION AND DEBRIDEMENT EXTREMITY;  Surgeon: Toni Arthurs, MD;  Location: [facility] OR;  Service: Orthopedics;  Laterality: Left;  LEFT FOOT I&D WITH POSSIBLE WOUND VAC APPLICATION, POSSIBLE LEFT FIRST RAY AMPUTATION  . Amputation  12/30/2011    Procedure: AMPUTATION RAY;  Surgeon: Toni Arthurs, MD;  Location: [facility] OR;  Service: Orthopedics;  Laterality: Left;  LEFT FIRST RAY AMPUTATION  . Eye surgery      cataract  . Amputation  01/13/2012    Procedure: AMPUTATION BELOW KNEE;  Surgeon: Toni Arthurs, MD;  Location: [facility] OR;  Service: Orthopedics;  Laterality: Left;  . Amputation  03/04/2012    Procedure: AMPUTATION BELOW KNEE;  Surgeon: Toni Arthurs, MD;  Location: [facility] OR;  Service: Orthopedics;  Laterality: Left;  Revision of Left Below Knee Amputation  . Tee without cardioversion N/A 07/07/2012    Procedure: TRANSESOPHAGEAL ECHOCARDIOGRAM (TEE);  Surgeon: Lewayne Bunting, MD;  Location: [facility] ENDOSCOPY;  Service: Cardiovascular;  Laterality: N/A;   Family History  Problem Relation Age of Onset  . Pneumonia Mother   . Gallbladder disease Mother     cancer  . Heart failure Mother   . Diabetes Father   . Coronary artery disease Father   . Stroke Neg Hx    History  Substance Use Topics  . Smoking status: Current Every Day Smoker -- 0.25 packs/day for 40 years    Types: Cigarettes  . Smokeless tobacco: Never Used  . Alcohol Use: 2.4 oz/week    4 Cans of beer per week      Comment: occ- foot ball season- 4 beers a week during football season.   OB History   Grav Para Term Preterm Abortions TAB SAB Ect Mult Living                 Review of Systems  Constitutional: Negative for fever and chills.  HENT: Positive for sinus pressure. Negative for ear pain, rhinorrhea, sore throat, trouble swallowing and voice change.   Eyes: Negative for photophobia, pain and visual disturbance.  Respiratory: Negative for cough and shortness of breath.   Cardiovascular: Negative for chest pain.  Gastrointestinal: Negative for nausea, vomiting, abdominal pain, diarrhea and constipation.  Genitourinary: Negative for dysuria, hematuria, flank pain and difficulty urinating.  Musculoskeletal: Positive for back pain and myalgias. Negative for arthralgias, neck pain and neck stiffness.  Skin: Negative for rash and wound.  Neurological: Negative for dizziness, weakness, light-headedness, numbness and headaches.  All other systems reviewed and are negative.    Allergies  Banana; Strawberry; and Penicillins  Home Medications   Current Outpatient Rx  Name  Route  Sig  Dispense  Refill  . acetaminophen (TYLENOL) 500 MG tablet   Oral   Take 1,000 mg by mouth daily as needed (pain).         Marland Kitchen dipyridamole-aspirin (AGGRENOX) 200-25 MG per 12 hr capsule   Oral   Take 1 capsule by mouth 2 (two) times daily.   60 capsule   2   . GLIMEPIRIDE PO   Oral   Take 1 tablet by mouth daily.         . insulin glargine (LANTUS) 100 UNIT/ML injection   Subcutaneous   Inject 0.25 mLs (25 Units total) into the skin at bedtime.   10 mL   12   . lisinopril (PRINIVIL,ZESTRIL) 20 MG tablet   Oral   Take 1 tablet (20 mg total) by mouth daily.   30 tablet   1   . simvastatin (ZOCOR) 80 MG tablet   Oral   Take 1 tablet (80 mg total) by mouth every evening.   30 tablet   1    BP 154/90  Pulse 92  Temp(Src) 97.6 F (36.4 C) (Oral)  Resp 20  Ht 5\' 6"  (1.676 m)  Wt 181 lb  (82.101 kg)  BMI 29.23 kg/m2  SpO2 100% Physical Exam  Nursing note and vitals reviewed. Constitutional: She is oriented to person, place, and time. She appears well-developed and well-nourished. No distress.  HENT:  Head: Normocephalic and atraumatic.  Mouth/Throat: Oropharynx is clear and moist. No oropharyngeal exudate.  Bilateral muscle or sinus tenderness to palpation. Patient has bilateral nasal turbinate edema.  Eyes: EOM are  normal. Pupils are equal, round, and reactive to light.  Neck: Normal range of motion. Neck supple.  No meningismus  Cardiovascular: Normal rate and regular rhythm.   Pulmonary/Chest: Effort normal and breath sounds normal. No respiratory distress. She has no wheezes. She has no rales. She exhibits no tenderness.  Abdominal: Soft. Bowel sounds are normal. She exhibits no distension and no mass. There is no tenderness. There is no rebound and no guarding.  Musculoskeletal: Normal range of motion. She exhibits no edema and no tenderness.  Patient has tenderness to palpation along the paraspinal muscles in the lumbar region on the right. She has negative leg raise. She no midline tenderness to palpation. Noted below the knee indication of the left.  Neurological: She is alert and oriented to person, place, and time.  Patient was extremities without deficit. No sensory changes.  Skin: Skin is warm and dry. No rash noted. No erythema.  Psychiatric: She has a normal mood and affect. Her behavior is normal.    ED Course  Procedures (including critical care time) Labs Review Labs Reviewed  CBC WITH DIFFERENTIAL  COMPREHENSIVE METABOLIC PANEL  URINALYSIS, ROUTINE W REFLEX MICROSCOPIC  PROTIME-INR  APTT   Imaging Review No results found.  EKG Interpretation   None       MDM  Patient says she is feeling much better after analgesia. Will treat for low back strain and sinusitis. Questionable to UTI. Patient follow with primary Dr. Return precautions  given.    Loren Racer, MD 04/20/13 [phone]

## 2013-04-19 NOTE — ED Notes (Signed)
Pt c/o lower back pain and headache onset yesterday.  Pt denies any nausea or vomiting. Denies urinary symptoms or injury

## 2013-04-20 LAB — COMPREHENSIVE METABOLIC PANEL
ALT: 13 U/L (ref 0–35)
AST: 20 U/L (ref 0–37)
Albumin: 3.5 g/dL (ref 3.5–5.2)
CO2: 27 mEq/L (ref 19–32)
Chloride: 100 mEq/L (ref 96–112)
Creatinine, Ser: 0.71 mg/dL (ref 0.50–1.10)
GFR calc non Af Amer: >90 mL/min (ref >90)
Sodium: 141 mEq/L (ref 135–145)
Total Bilirubin: 0.5 mg/dL (ref 0.3–1.2)

## 2013-04-20 LAB — CBC WITH DIFFERENTIAL/PLATELET
Basophils Absolute: 0 10*3/uL (ref 0.0–0.1)
Basophils Relative: 0 % (ref 0–1)
HCT: 41.7 % (ref 36.0–46.0)
Lymphocytes Relative: 24 % (ref 12–46)
MCHC: 33.1 g/dL (ref 30.0–36.0)
Neutro Abs: 6.4 10*3/uL (ref 1.7–7.7)
Neutrophils Relative %: 70 % (ref 43–77)
Platelets: 227 10*3/uL (ref 150–400)
RBC: 5.01 MIL/uL (ref 3.87–5.11)
RDW: 14 % (ref 11.5–15.5)

## 2013-04-20 LAB — URINALYSIS, ROUTINE W REFLEX MICROSCOPIC
Glucose, UA: NEGATIVE mg/dL
Hgb urine dipstick: NEGATIVE
Protein, ur: NEGATIVE mg/dL
Specific Gravity, Urine: 1.022 (ref 1.005–1.030)
Urobilinogen, UA: 1 mg/dL (ref 0.0–1.0)

## 2013-04-20 LAB — URINE MICROSCOPIC-ADD ON

## 2013-04-20 LAB — PROTIME-INR
INR: 1 (ref 0.00–1.49)
Prothrombin Time: 13 seconds (ref 11.6–15.2)

## 2013-04-20 MED ORDER — IBUPROFEN 600 MG PO TABS: 600.0000 mg | ORAL_TABLET | Freq: Four times a day (QID) | ORAL | Status: DC | PRN

## 2013-04-20 MED ORDER — OXYMETAZOLINE HCL 0.05 % NA SOLN: 1.0000 | Freq: Two times a day (BID) | NASAL | Status: DC

## 2013-04-20 MED ORDER — TRAMADOL HCL 50 MG PO TABS: 50.0000 mg | ORAL_TABLET | Freq: Four times a day (QID) | ORAL | Status: DC | PRN

## 2013-04-20 MED ORDER — SULFAMETHOXAZOLE-TRIMETHOPRIM 800-160 MG PO TABS: 1.0000 | ORAL_TABLET | Freq: Two times a day (BID) | ORAL | Status: DC

## 2013-04-20 MED ORDER — MOMETASONE FUROATE 50 MCG/ACT NA SUSP: 2.0000 | Freq: Every day | NASAL | Status: DC

## 2013-04-20 NOTE — ED Notes (Signed)
Patient is alert and orientedx4.  Patient was explained discharge instructions and they understood them with no questions.  The patient's sister, Nikki Dom, is taking the patient home.

## 2013-04-20 NOTE — ED Notes (Signed)
Family at bedside. 

## 2013-04-21 LAB — URINE CULTURE

## 2013-07-07 ENCOUNTER — Emergency Department (HOSPITAL_COMMUNITY)
Admission: EM | Admit: 2013-07-07 | Discharge: 2013-07-08 | Disposition: A | Discharge status: Home / Self Care | Payer: BC Managed Care – PPO | Attending: Emergency Medicine | Admitting: Emergency Medicine

## 2013-07-07 ENCOUNTER — Encounter (HOSPITAL_COMMUNITY): Payer: Self-pay | Admitting: Emergency Medicine

## 2013-07-07 DIAGNOSIS — Z7902 Long term (current) use of antithrombotics/antiplatelets: Secondary | ICD-10-CM | POA: Insufficient documentation

## 2013-07-07 DIAGNOSIS — E785 Hyperlipidemia, unspecified: Secondary | ICD-10-CM | POA: Insufficient documentation

## 2013-07-07 DIAGNOSIS — Z794 Long term (current) use of insulin: Secondary | ICD-10-CM | POA: Insufficient documentation

## 2013-07-07 DIAGNOSIS — K589 Irritable bowel syndrome without diarrhea: Secondary | ICD-10-CM | POA: Insufficient documentation

## 2013-07-07 DIAGNOSIS — M25512 Pain in left shoulder: Secondary | ICD-10-CM

## 2013-07-07 DIAGNOSIS — R519 Headache, unspecified: Secondary | ICD-10-CM

## 2013-07-07 DIAGNOSIS — L0291 Cutaneous abscess, unspecified: Secondary | ICD-10-CM | POA: Insufficient documentation

## 2013-07-07 DIAGNOSIS — G609 Hereditary and idiopathic neuropathy, unspecified: Secondary | ICD-10-CM | POA: Insufficient documentation

## 2013-07-07 DIAGNOSIS — I10 Essential (primary) hypertension: Secondary | ICD-10-CM | POA: Insufficient documentation

## 2013-07-07 DIAGNOSIS — R42 Dizziness and giddiness: Secondary | ICD-10-CM | POA: Insufficient documentation

## 2013-07-07 DIAGNOSIS — Z8673 Personal history of transient ischemic attack (TIA), and cerebral infarction without residual deficits: Secondary | ICD-10-CM | POA: Insufficient documentation

## 2013-07-07 DIAGNOSIS — R0789 Other chest pain: Secondary | ICD-10-CM | POA: Insufficient documentation

## 2013-07-07 DIAGNOSIS — K219 Gastro-esophageal reflux disease without esophagitis: Secondary | ICD-10-CM | POA: Insufficient documentation

## 2013-07-07 DIAGNOSIS — Z880 Allergy status to penicillin: Secondary | ICD-10-CM | POA: Insufficient documentation

## 2013-07-07 DIAGNOSIS — F17200 Nicotine dependence, unspecified, uncomplicated: Secondary | ICD-10-CM | POA: Insufficient documentation

## 2013-07-07 DIAGNOSIS — L0390 Cellulitis, unspecified: Secondary | ICD-10-CM

## 2013-07-07 DIAGNOSIS — R55 Syncope and collapse: Secondary | ICD-10-CM | POA: Insufficient documentation

## 2013-07-07 DIAGNOSIS — L738 Other specified follicular disorders: Secondary | ICD-10-CM | POA: Insufficient documentation

## 2013-07-07 DIAGNOSIS — R51 Headache: Secondary | ICD-10-CM

## 2013-07-07 DIAGNOSIS — K5900 Constipation, unspecified: Secondary | ICD-10-CM | POA: Insufficient documentation

## 2013-07-07 DIAGNOSIS — M25519 Pain in unspecified shoulder: Secondary | ICD-10-CM | POA: Insufficient documentation

## 2013-07-07 DIAGNOSIS — Z7982 Long term (current) use of aspirin: Secondary | ICD-10-CM | POA: Insufficient documentation

## 2013-07-07 DIAGNOSIS — Z79899 Other long term (current) drug therapy: Secondary | ICD-10-CM | POA: Insufficient documentation

## 2013-07-07 DIAGNOSIS — K449 Diaphragmatic hernia without obstruction or gangrene: Secondary | ICD-10-CM | POA: Insufficient documentation

## 2013-07-07 NOTE — ED Notes (Signed)
Pt states she has been having left shoulder for month and a HA for two days

## 2013-07-08 ENCOUNTER — Emergency Department (HOSPITAL_COMMUNITY): Payer: BC Managed Care – PPO

## 2013-07-08 ENCOUNTER — Encounter (HOSPITAL_COMMUNITY): Payer: Self-pay | Admitting: Radiology

## 2013-07-08 MED ORDER — TRAMADOL HCL 50 MG PO TABS: 50.0000 mg | ORAL_TABLET | Freq: Four times a day (QID) | ORAL | Status: DC | PRN

## 2013-07-08 MED ORDER — OXYCODONE-ACETAMINOPHEN 5-325 MG PO TABS
2.0000 | ORAL_TABLET | Freq: Once | ORAL | Status: AC
  Administered 2013-07-08: 2 via ORAL
  Filled 2013-07-08: qty 2

## 2013-07-08 NOTE — ED Provider Notes (Signed)
CSN: [id number]     Arrival date & time 07/07/13  1946 History   First MD Initiated Contact with Patient 07/07/13 2301     Chief Complaint  Patient presents with  . Headache  . Shoulder Pain     (Consider location/radiation/quality/duration/timing/severity/associated sxs/prior Treatment) HPI 58 year old female presents to emergency room from home with complaint of 2 days of right-sided headache and one month of left shoulder pain.  She denies any trauma or injury to her left shoulder.  Pain is worse at night.  It is worse with trying to lift things overhead.  She denies any radiation of the pain.  Headache is typical of her normal headaches.  She has tried ibuprofen for her shoulder pain, and for her headache without improvement.  No weakness, numbness, dizziness, difficulty speaking or swallowing.  Patient has history of prior stroke, for which she is on Aggrenox. Past Medical History  Diagnosis Date  . Atypical chest pain     a. non obstructive by cath in 2008 and 2010;  b. 02/2012 Myoview: non-ischemic, EF 57%  . HTN (hypertension)   . Hyperlipidemia   . Syncope and collapse     a. near-syncopal episode in November 2008;  b. 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  . CVA (cerebral infarction)     a.  Small right parietal noted incidentally 04/2007;  b. right sided embolic CVA [id number];  c. 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 Peripheral neuropathy   . Obesity, unspecified   . Esophageal reflux   . Diabetes mellitus, type II   . Diaphragmatic hernia without mention of obstruction or gangrene   . Irritable bowel syndrome   . Morbid obesity   . Cellulitis     a. 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 Stroke     2013-'no residual'  . Vertigo   . Dry skin   . Constipation   . Tobacco abuse    Past Surgical History  Procedure Laterality Date  . Invasive electrophysiologic study  5/09    followed by insertion of an implantable loop recorder. S/p removal   . Colonoscopy    . Cholecystectomy    . Multiple toe surgeries    . Cardiac catheterization      LAD 30%, circumflex 50%, OM 75%, RI 60% with small branch 80%, dominant RCA 60%, EF 45-50%  . I&d extremity  12/23/2011    Procedure: IRRIGATION AND DEBRIDEMENT EXTREMITY;  Surgeon: Augustin Schooling, MD;  Location: [place];  Service: Orthopedics;  Laterality: Left;  Left Foot  . I&d extremity  12/26/2011    Procedure: IRRIGATION AND DEBRIDEMENT EXTREMITY;  Surgeon: Wylene Simmer, MD;  Location: [place];  Service: Orthopedics;  Laterality: Left;  LEFT FOOT I&D WITH POSSIBLE WOUND VAC APPLICATION, POSSIBLE LEFT FIRST RAY AMPUTATION  . Amputation  12/30/2011    Procedure: AMPUTATION RAY;  Surgeon: Wylene Simmer, MD;  Location: [place];  Service: Orthopedics;  Laterality: Left;  LEFT FIRST RAY AMPUTATION  . Eye surgery      cataract  . Amputation  01/13/2012    Procedure: AMPUTATION BELOW KNEE;  Surgeon: Wylene Simmer, MD;  Location: [place];  Service: Orthopedics;  Laterality: Left;  . Amputation  03/04/2012    Procedure: AMPUTATION BELOW KNEE;  Surgeon: Wylene Simmer, MD;  Location: [place];  Service: Orthopedics;  Laterality: Left;  Revision of Left Below  Knee Amputation  . Tee without cardioversion N/A 07/07/2012    Procedure: TRANSESOPHAGEAL ECHOCARDIOGRAM (TEE);  Surgeon: Lelon Perla, MD;  Location: [facility] ENDOSCOPY;  Service: Cardiovascular;  Laterality: N/A;   Family History  Problem Relation Age of Onset  . Pneumonia Mother   . Gallbladder disease Mother     cancer  . Heart failure Mother   . Diabetes Father   . Coronary artery disease Father   . Stroke Neg Hx    History  Substance Use Topics  . Smoking status: Current Every Day Smoker -- 0.25 packs/day for 40 years    Types: Cigarettes  . Smokeless tobacco: Never Used  . 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   OB History   Grav Para Term Preterm Abortions TAB SAB Ect Mult Living                 Review of Systems   See History of Present Illness; otherwise all other systems are reviewed and negative  Allergies  Banana; Strawberry; and Penicillins  Home Medications   Current Outpatient Rx  Name  Route  Sig  Dispense  Refill  . acetaminophen (TYLENOL) 500 MG tablet   Oral   Take 1,000 mg by mouth daily as needed (pain).         Marland Kitchen aspirin EC 81 MG tablet   Oral   Take 81 mg by mouth daily as needed for mild pain.         Marland Kitchen dipyridamole-aspirin (AGGRENOX) 200-25 MG per 12 hr capsule   Oral   Take 1 capsule by mouth 2 (two) times daily.   60 capsule   2   . insulin glargine (LANTUS) 100 UNIT/ML injection   Subcutaneous   Inject 0.25 mLs (25 Units total) into the skin at bedtime.   10 mL   12   . lisinopril (PRINIVIL,ZESTRIL) 20 MG tablet   Oral   Take 1 tablet (20 mg total) by mouth daily.   30 tablet   1   . mometasone (NASONEX) 50 MCG/ACT nasal spray   Nasal   Place 2 sprays into the nose daily as needed (allergies).         . simvastatin (ZOCOR) 80 MG tablet   Oral   Take 1 tablet (80 mg total) by mouth every evening.   30 tablet   1   . traMADol (ULTRAM) 50 MG tablet   Oral   Take 1 tablet (50 mg total) by mouth every 6 (six) hours as needed for moderate pain.   30 tablet   0    BP 155/69  Pulse 77  Temp(Src) 98.3 F (36.8 C) (Oral)  Resp 18  Ht 5\' 6"  (1.676 m)  Wt 184 lb (83.462 kg)  BMI 29.71 kg/m2  SpO2 100% Physical Exam  Nursing note and vitals reviewed. Constitutional: She is oriented to person, place, and time. She appears well-developed and well-nourished.  HENT:  Head: Normocephalic and atraumatic.  Right Ear: External ear normal.  Left Ear: External ear normal.  Nose: Nose normal.  Mouth/Throat: Oropharynx is clear and moist.  Eyes: Conjunctivae and EOM are normal. Pupils are equal, round, and reactive to light.   Neck: Normal range of motion. Neck supple. No JVD present. No tracheal deviation present. No thyromegaly present.  Cardiovascular: Normal rate, regular rhythm, normal heart sounds and  intact distal pulses.  Exam reveals no gallop and no friction rub.   No murmur heard. Pulmonary/Chest: Effort normal and breath sounds normal. No stridor. No respiratory distress. She has no wheezes. She has no rales. She exhibits no tenderness.  Abdominal: Soft. Bowel sounds are normal. She exhibits no distension and no mass. There is no tenderness. There is no rebound and no guarding.  Musculoskeletal: Normal range of motion. She exhibits tenderness (patient has tenderness to palpation of rotator cuff of left shoulder.  She has pain with external rotation and abduction.  No step-off, crepitus or deformity noted.  No overlying skin changes.). She exhibits no edema.  Lymphadenopathy:    She has no cervical adenopathy.  Neurological: She is alert and oriented to person, place, and time. She has normal reflexes. No cranial nerve deficit. She exhibits normal muscle tone. Coordination normal.  Skin: Skin is warm and dry. No rash noted. No erythema. No pallor.  Psychiatric: She has a normal mood and affect. Her behavior is normal. Judgment and thought content normal.    ED Course  Procedures (including critical care time) Labs Review Labs Reviewed - No data to display Imaging Review Ct Head Wo Contrast  07/08/2013   CLINICAL DATA:  Nausea, headache.  EXAM: CT HEAD WITHOUT CONTRAST  TECHNIQUE: Contiguous axial images were obtained from the base of the skull through the vertex without intravenous contrast.  COMPARISON:  MR HEAD W/O CM dated 01/10/2013; CT HEAD W/O CM dated 01/09/2013  FINDINGS: Age advanced cerebral atrophy. Mild chronic small vessel disease changes throughout the deep white matter. Old bilateral lacunar infarcts in the left internal capsule and right thalamus, stable. No acute infarction. No hemorrhage or  hydrocephalus. No mass lesion or midline shift. No acute calvarial abnormality.  Visualized paranasal sinuses and mastoids clear. Orbital soft tissues unremarkable.  IMPRESSION: Atrophy, chronic microvascular disease advanced for patient's age.  Bilateral lacunar infarcts are stable.  No acute findings.   Electronically Signed   By: Rolm Baptise M.D.   On: 07/08/2013 00:34   Dg Shoulder Left  07/08/2013   CLINICAL DATA:  Headache and proximal humerus pain for 1 month. No trauma.  EXAM: LEFT SHOULDER - 2+ VIEW  COMPARISON:  None available for comparison at time of study interpretation.  FINDINGS: The humeral head is well-formed and located. The subacromial, glenohumeral and acromioclavicular joint spaces are intact. No destructive bony lesions. Soft tissue planes are non-suspicious.  IMPRESSION: No acute fracture deformity nor dislocation.   Electronically Signed   By: Elon Alas   On: 07/08/2013 00:56    EKG Interpretation   None       MDM   Final diagnoses:  Shoulder pain, left  Headache    58 year old female with ongoing left shoulder pain for last month.  Differential includes a stress, redness, or return injury.  She also has a headache.  Headache, appears to be benign.  X-rays and CTs today are negative.  Patient feels better after Percocet.  Plan discussed the C. home to followup with orthopedics and her primary care Dr.    Kalman Drape, MD 07/08/13 [phone]

## 2013-07-08 NOTE — Discharge Instructions (Signed)
Please take medications as prescribed.  Your CAT scan today did not show any new abnormalities.  Your shoulder did not have any fractures or dislocation on x-ray.  Based on your symptoms, this may be arthritis or rotator cuff problems.  Please followup with the orthopedist listed above.  Try to decrease use of that arm.  Return emergency room for worsening condition or new concerning symptoms.   Arthralgia Arthralgia is joint pain. A joint is a place where two bones meet. Joint pain can happen for many reasons. The joint can be bruised, stiff, infected, or weak from aging. Pain usually goes away after resting and taking medicine for soreness.  HOME CARE  Rest the joint as told by your doctor.  Keep the sore joint raised (elevated) for the first 24 hours.  Put ice on the joint area.  Put ice in a plastic bag.  Place a towel between your skin and the bag.  Leave the ice on for 15-20 minutes, 03-04 times a day.  Wear your splint, casting, elastic bandage, or sling as told by your doctor.  Only take medicine as told by your doctor. Do not take aspirin.  Use crutches as told by your doctor. Do not put weight on the joint until told to by your doctor. GET HELP RIGHT AWAY IF:   You have bruising, puffiness (swelling), or more pain.  Your fingers or toes turn blue or start to lose feeling (numb).  Your medicine does not lessen the pain.  Your pain becomes severe.  You have a temperature by mouth above 102 F (38.9 C), not controlled by medicine.  You cannot move or use the joint. MAKE SURE YOU:   Understand these instructions.  Will watch your condition.  Will get help right away if you are not doing well or get worse. Document Released: 04/23/2009 Document Revised: 07/28/2011 Document Reviewed: 04/23/2009 [facility] Patient Information 2014 [place], [place].  Headaches, Frequently Asked Questions MIGRAINE HEADACHES Q: What is migraine? What causes it? How can I treat it? A:  Generally, migraine headaches begin as a dull ache. Then they develop into a constant, throbbing, and pulsating pain. You may experience pain at the temples. You may experience pain at the front or back of one or both sides of the head. The pain is usually accompanied by a combination of:  Nausea.  Vomiting.  Sensitivity to light and noise. Some people (about 15%) experience an aura (see below) before an attack. The cause of migraine is believed to be chemical reactions in the brain. Treatment for migraine may include over-the-counter or prescription medications. It may also include self-help techniques. These include relaxation training and biofeedback.  Q: What is an aura? A: About 15% of people with migraine get an "aura". This is a sign of neurological symptoms that occur before a migraine headache. You may see wavy or jagged lines, dots, or flashing lights. You might experience tunnel vision or blind spots in one or both eyes. The aura can include visual or auditory hallucinations (something imagined). It may include disruptions in smell (such as strange odors), taste or touch. Other symptoms include:  Numbness.  A "pins and needles" sensation.  Difficulty in recalling or speaking the correct word. These neurological events may last as long as 60 minutes. These symptoms will fade as the headache begins. Q: What is a trigger? A: Certain physical or environmental factors can lead to or "trigger" a migraine. These include:  Foods.  Hormonal changes.  Weather.  Stress.  It is important to remember that triggers are different for everyone. To help prevent migraine attacks, you need to figure out which triggers affect you. Keep a headache diary. This is a good way to track triggers. The diary will help you talk to your healthcare professional about your condition. Q: Does weather affect migraines? A: Bright sunshine, hot, humid conditions, and drastic changes in barometric pressure may  lead to, or "trigger," a migraine attack in some people. But studies have shown that weather does not act as a trigger for everyone with migraines. Q: What is the link between migraine and hormones? A: Hormones start and regulate many of your body's functions. Hormones keep your body in balance within a constantly changing environment. The levels of hormones in your body are unbalanced at times. Examples are during menstruation, pregnancy, or menopause. That can lead to a migraine attack. In fact, about three quarters of all women with migraine report that their attacks are related to the menstrual cycle.  Q: Is there an increased risk of stroke for migraine sufferers? A: The likelihood of a migraine attack causing a stroke is very remote. That is not to say that migraine sufferers cannot have a stroke associated with their migraines. In persons under age 79, the most common associated factor for stroke is migraine headache. But over the course of a person's normal life span, the occurrence of migraine headache may actually be associated with a reduced risk of dying from cerebrovascular disease due to stroke.  Q: What are acute medications for migraine? A: Acute medications are used to treat the pain of the headache after it has started. Examples over-the-counter medications, NSAIDs, ergots, and triptans.  Q: What are the triptans? A: Triptans are the newest class of abortive medications. They are specifically targeted to treat migraine. Triptans are vasoconstrictors. They moderate some chemical reactions in the brain. The triptans work on receptors in your brain. Triptans help to restore the balance of a neurotransmitter called serotonin. Fluctuations in levels of serotonin are thought to be a main cause of migraine.  Q: Are over-the-counter medications for migraine effective? A: Over-the-counter, or "OTC," medications may be effective in relieving mild to moderate pain and associated symptoms of  migraine. But you should see your caregiver before beginning any treatment regimen for migraine.  Q: What are preventive medications for migraine? A: Preventive medications for migraine are sometimes referred to as "prophylactic" treatments. They are used to reduce the frequency, severity, and length of migraine attacks. Examples of preventive medications include antiepileptic medications, antidepressants, beta-blockers, calcium channel blockers, and NSAIDs (nonsteroidal anti-inflammatory drugs). Q: Why are anticonvulsants used to treat migraine? A: During the past few years, there has been an increased interest in antiepileptic drugs for the prevention of migraine. They are sometimes referred to as "anticonvulsants". Both epilepsy and migraine may be caused by similar reactions in the brain.  Q: Why are antidepressants used to treat migraine? A: Antidepressants are typically used to treat people with depression. They may reduce migraine frequency by regulating chemical levels, such as serotonin, in the brain.  Q: What alternative therapies are used to treat migraine? A: The term "alternative therapies" is often used to describe treatments considered outside the scope of conventional Western medicine. Examples of alternative therapy include acupuncture, acupressure, and yoga. Another common alternative treatment is herbal therapy. Some herbs are believed to relieve headache pain. Always discuss alternative therapies with your caregiver before proceeding. Some herbal products contain arsenic and other toxins. TENSION HEADACHES Q:  What is a tension-type headache? What causes it? How can I treat it? A: Tension-type headaches occur randomly. They are often the result of temporary stress, anxiety, fatigue, or anger. Symptoms include soreness in your temples, a tightening band-like sensation around your head (a "vice-like" ache). Symptoms can also include a pulling feeling, pressure sensations, and contracting  head and neck muscles. The headache begins in your forehead, temples, or the back of your head and neck. Treatment for tension-type headache may include over-the-counter or prescription medications. Treatment may also include self-help techniques such as relaxation training and biofeedback. CLUSTER HEADACHES Q: What is a cluster headache? What causes it? How can I treat it? A: Cluster headache gets its name because the attacks come in groups. The pain arrives with little, if any, warning. It is usually on one side of the head. A tearing or bloodshot eye and a runny nose on the same side of the headache may also accompany the pain. Cluster headaches are believed to be caused by chemical reactions in the brain. They have been described as the most severe and intense of any headache type. Treatment for cluster headache includes prescription medication and oxygen. SINUS HEADACHES Q: What is a sinus headache? What causes it? How can I treat it? A: When a cavity in the bones of the face and skull (a sinus) becomes inflamed, the inflammation will cause localized pain. This condition is usually the result of an allergic reaction, a tumor, or an infection. If your headache is caused by a sinus blockage, such as an infection, you will probably have a fever. An x-ray will confirm a sinus blockage. Your caregiver's treatment might include antibiotics for the infection, as well as antihistamines or decongestants.  REBOUND HEADACHES Q: What is a rebound headache? What causes it? How can I treat it? A: A pattern of taking acute headache medications too often can lead to a condition known as "rebound headache." A pattern of taking too much headache medication includes taking it more than 2 days per week or in excessive amounts. That means more than the label or a caregiver advises. With rebound headaches, your medications not only stop relieving pain, they actually begin to cause headaches. Doctors treat rebound headache  by tapering the medication that is being overused. Sometimes your caregiver will gradually substitute a different type of treatment or medication. Stopping may be a challenge. Regularly overusing a medication increases the potential for serious side effects. Consult a caregiver if you regularly use headache medications more than 2 days per week or more than the label advises. ADDITIONAL QUESTIONS AND ANSWERS Q: What is biofeedback? A: Biofeedback is a self-help treatment. Biofeedback uses special equipment to monitor your body's involuntary physical responses. Biofeedback monitors:  Breathing.  Pulse.  Heart rate.  Temperature.  Muscle tension.  Brain activity. Biofeedback helps you refine and perfect your relaxation exercises. You learn to control the physical responses that are related to stress. Once the technique has been mastered, you do not need the equipment any more. Q: Are headaches hereditary? A: Four out of five (80%) of people that suffer report a family history of migraine. Scientists are not sure if this is genetic or a family predisposition. Despite the uncertainty, a child has a 50% chance of having migraine if one parent suffers. The child has a 75% chance if both parents suffer.  Q: Can children get headaches? A: By the time they reach high school, most young people have experienced some type of headache. Many  safe and effective approaches or medications can prevent a headache from occurring or stop it after it has begun.  Q: What type of doctor should I see to diagnose and treat my headache? A: Start with your primary caregiver. Discuss his or her experience and approach to headaches. Discuss methods of classification, diagnosis, and treatment. Your caregiver may decide to recommend you to a headache specialist, depending upon your symptoms or other physical conditions. Having diabetes, allergies, etc., may require a more comprehensive and inclusive approach to your headache.  The National Head[employer] Foundation will provide, upon request, a list of [facility] physician members in your state. Document Released: 07/26/2003 Document Revised: 07/28/2011 Document Reviewed: 01/03/2008 [facility] Patient Information 2014 [place].  Shoulder Pain The shoulder is the joint that connects your arm to your body. Muscles and band-like tissues that connect bones to muscles (tendons) hold the joint together. Shoulder pain is felt if an injury or medical problem affects one or more parts of the shoulder. HOME CARE   Put ice on the sore area.  Put ice in a plastic bag.  Place a towel between your skin and the bag.  Leave the ice on for 15-20 minutes, 03-04 times a day for the first 2 days.  Stop using cold packs if they do not help with the pain.  If you were given something to keep your shoulder from moving (sling, shoulder immobilizer), wear it as told. Only take it off to shower or bathe.  Move your arm as little as possible, but keep your hand moving to prevent puffiness (swelling).  Squeeze a soft ball or foam pad as much as possible to help prevent swelling.  Take medicine as told by your doctor. GET HELP RIGHT AWAY IF:   Your arm, hand, or fingers are numb or tingling.  Your arm, hand, or fingers are puffy (swollen), painful, or turn white or blue.  You have more pain.  You have progressing new pain in your arm, hand, or fingers.  Your hand or fingers get cold.  Your medicine does not help lessen your pain. MAKE SURE YOU:   Understand these instructions.  Will watch your condition.  Will get help right away if you are not doing well or get worse. Document Released: 10/22/2007 Document Revised: 01/28/2012 Document Reviewed: 11/17/2011 [facility] Patient Information 2014 [place], [place].

## 2013-09-15 ENCOUNTER — Encounter: Payer: Self-pay | Admitting: Podiatry

## 2013-09-15 ENCOUNTER — Ambulatory Visit (INDEPENDENT_AMBULATORY_CARE_PROVIDER_SITE_OTHER): Payer: BC Managed Care – PPO | Admitting: Podiatry

## 2013-09-15 ENCOUNTER — Ambulatory Visit (INDEPENDENT_AMBULATORY_CARE_PROVIDER_SITE_OTHER): Payer: BC Managed Care – PPO

## 2013-09-15 VITALS — BP 115/68 | HR 89 | Resp 15 | Ht 66.0 in | Wt 167.0 lb

## 2013-09-15 DIAGNOSIS — M7730 Calcaneal spur, unspecified foot: Secondary | ICD-10-CM

## 2013-09-15 DIAGNOSIS — M79609 Pain in unspecified limb: Secondary | ICD-10-CM

## 2013-09-15 DIAGNOSIS — B351 Tinea unguium: Secondary | ICD-10-CM

## 2013-09-15 DIAGNOSIS — M216X9 Other acquired deformities of unspecified foot: Secondary | ICD-10-CM

## 2013-09-15 NOTE — Progress Notes (Signed)
   Subjective:    Patient ID: [name], female    DOB: 02/06/1956, 58 y.o.   MRN: [id number]  HPI Comments: N foot pain L right foot D couple weeks O C dull A walking, weightbearing T ice, Ibuprofen  Foot Pain      Review of Systems  All other systems reviewed and are negative.      Objective:   Physical Exam        Assessment & Plan:

## 2013-09-16 NOTE — Progress Notes (Signed)
Subjective:     Patient ID: [name], female   DOB: Oct 09, 1955, 58 y.o.   MRN: [id number]  HPI patient presents with caregiver stating that there is pain on her right foot but they were concerned about and the nails on her right foot are very thickened and impossible for her to cut  Review of Systems     Objective:   Physical Exam Neurovascular status unchanged with abnormal nail condition right with thickness and pain and also found to have keratotic lesions sub-right which seems to be irritated. Muscle strength within normal limits and range of motion diminished subtalar midtarsal joint    Assessment:     Nail disease and plantar keratotic lesion right that appears to be symptomatic    Plan:     H&P and education rendered and went ahead debridement of nails and lesion and advised if symptoms persist or reappoint as needed

## 2013-09-30 ENCOUNTER — Emergency Department (HOSPITAL_COMMUNITY): Payer: Medicaid Other

## 2013-09-30 ENCOUNTER — Emergency Department (HOSPITAL_COMMUNITY)
Admission: EM | Admit: 2013-09-30 | Discharge: 2013-09-30 | Disposition: A | Discharge status: Home / Self Care | Payer: Medicaid Other | Attending: Emergency Medicine | Admitting: Emergency Medicine

## 2013-09-30 ENCOUNTER — Encounter (HOSPITAL_COMMUNITY): Payer: Self-pay | Admitting: Emergency Medicine

## 2013-09-30 DIAGNOSIS — Z7982 Long term (current) use of aspirin: Secondary | ICD-10-CM | POA: Diagnosis not present

## 2013-09-30 DIAGNOSIS — E785 Hyperlipidemia, unspecified: Secondary | ICD-10-CM | POA: Insufficient documentation

## 2013-09-30 DIAGNOSIS — R5381 Other malaise: Secondary | ICD-10-CM | POA: Insufficient documentation

## 2013-09-30 DIAGNOSIS — E669 Obesity, unspecified: Secondary | ICD-10-CM | POA: Diagnosis not present

## 2013-09-30 DIAGNOSIS — Z7901 Long term (current) use of anticoagulants: Secondary | ICD-10-CM | POA: Diagnosis not present

## 2013-09-30 DIAGNOSIS — Z8719 Personal history of other diseases of the digestive system: Secondary | ICD-10-CM | POA: Diagnosis not present

## 2013-09-30 DIAGNOSIS — Z8673 Personal history of transient ischemic attack (TIA), and cerebral infarction without residual deficits: Secondary | ICD-10-CM | POA: Insufficient documentation

## 2013-09-30 DIAGNOSIS — F17200 Nicotine dependence, unspecified, uncomplicated: Secondary | ICD-10-CM | POA: Insufficient documentation

## 2013-09-30 DIAGNOSIS — Z880 Allergy status to penicillin: Secondary | ICD-10-CM | POA: Diagnosis not present

## 2013-09-30 DIAGNOSIS — Z872 Personal history of diseases of the skin and subcutaneous tissue: Secondary | ICD-10-CM | POA: Insufficient documentation

## 2013-09-30 DIAGNOSIS — Z8619 Personal history of other infectious and parasitic diseases: Secondary | ICD-10-CM | POA: Insufficient documentation

## 2013-09-30 DIAGNOSIS — R51 Headache: Secondary | ICD-10-CM | POA: Insufficient documentation

## 2013-09-30 DIAGNOSIS — I10 Essential (primary) hypertension: Secondary | ICD-10-CM | POA: Diagnosis not present

## 2013-09-30 DIAGNOSIS — E119 Type 2 diabetes mellitus without complications: Secondary | ICD-10-CM | POA: Insufficient documentation

## 2013-09-30 DIAGNOSIS — R5383 Other fatigue: Secondary | ICD-10-CM

## 2013-09-30 DIAGNOSIS — Z794 Long term (current) use of insulin: Secondary | ICD-10-CM | POA: Diagnosis not present

## 2013-09-30 DIAGNOSIS — IMO0002 Reserved for concepts with insufficient information to code with codable children: Secondary | ICD-10-CM | POA: Diagnosis not present

## 2013-09-30 LAB — URINALYSIS, ROUTINE W REFLEX MICROSCOPIC
Bilirubin Urine: NEGATIVE
Glucose, UA: NEGATIVE mg/dL
HGB URINE DIPSTICK: NEGATIVE
Ketones, ur: NEGATIVE mg/dL
NITRITE: NEGATIVE
Protein, ur: NEGATIVE mg/dL
SPECIFIC GRAVITY, URINE: 1.019 (ref 1.005–1.030)
Urobilinogen, UA: 1 mg/dL (ref 0.0–1.0)
pH: 5.5 (ref 5.0–8.0)

## 2013-09-30 LAB — CBC WITH DIFFERENTIAL/PLATELET
Basophils Absolute: 0 10*3/uL (ref 0.0–0.1)
Basophils Relative: 0 % (ref 0–1)
EOS ABS: 0.1 10*3/uL (ref 0.0–0.7)
Eosinophils Relative: 1 % (ref 0–5)
HCT: 39.2 % (ref 36.0–46.0)
Hemoglobin: 12.7 g/dL (ref 12.0–15.0)
Lymphocytes Relative: 36 % (ref 12–46)
Lymphs Abs: 2.5 10*3/uL (ref 0.7–4.0)
MCH: 27.1 pg (ref 26.0–34.0)
MCHC: 32.4 g/dL (ref 30.0–36.0)
MCV: 83.6 fL (ref 78.0–100.0)
Monocytes Absolute: 0.3 10*3/uL (ref 0.1–1.0)
Monocytes Relative: 4 % (ref 3–12)
Neutro Abs: 4 10*3/uL (ref 1.7–7.7)
Neutrophils Relative %: 59 % (ref 43–77)
Platelets: 232 10*3/uL (ref 150–400)
RBC: 4.69 MIL/uL (ref 3.87–5.11)
RDW: 13.7 % (ref 11.5–15.5)
WBC: 6.9 10*3/uL (ref 4.0–10.5)

## 2013-09-30 LAB — URINE MICROSCOPIC-ADD ON

## 2013-09-30 LAB — BASIC METABOLIC PANEL
BUN: 23 mg/dL (ref 6–23)
CO2: 25 mEq/L (ref 19–32)
Calcium: 8.6 mg/dL (ref 8.4–10.5)
Chloride: 99 mEq/L (ref 96–112)
Creatinine, Ser: 1.03 mg/dL (ref 0.50–1.10)
GFR, EST AFRICAN AMERICAN: 68 mL/min — AB (ref >90)
GFR, EST NON AFRICAN AMERICAN: 59 mL/min — AB (ref >90)
GLUCOSE: 126 mg/dL — AB (ref 70–99)
Potassium: 4.1 mEq/L (ref 3.7–5.3)
Sodium: 140 mEq/L (ref 137–147)

## 2013-09-30 MED ORDER — SODIUM CHLORIDE 0.9 % IV SOLN
INTRAVENOUS | Status: DC
  Administered 2013-09-30: 18:00:00 via INTRAVENOUS

## 2013-09-30 NOTE — ED Provider Notes (Signed)
CSN: [id number]     Arrival date & time 09/30/13  1547 History   First MD Initiated Contact with Patient 09/30/13 1549     Chief Complaint  Patient presents with  . Fatigue  . Generalized Body Aches     (Consider location/radiation/quality/duration/timing/severity/associated sxs/prior Treatment) The history is provided by the patient and a relative.   [name] is a 58 y.o. female who is here for evaluation of waxing and waning mental status, headache, and left arm pain. Her daughter was with this when the symptoms started at 2 PM today. Her daughter was worried that she might have a stroke. Her periods of decreased mental status. Last just a few seconds, and appear like the patient "Passes out". After these episodes, she awakes quickly and is lucid. Her daughter asked the patient, if she was weak, and she stated her right foot was weak. Her mental status is improved now. Her headache is spontaneously better, from 9/10-7/10. She states, that she's never had this previously. She is taking her usual medications, without relief.     Past Medical History  Diagnosis Date  . Atypical chest pain     a. non obstructive by cath in 2008 and 2010;  b. 02/2012 Myoview: non-ischemic, EF 57%  . HTN (hypertension)   . Hyperlipidemia   . Syncope and collapse     a. near-syncopal episode in November 2008;  b. 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  . CVA (cerebral infarction)     a.  Small right parietal noted incidentally 04/2007;  b. right sided embolic CVA [id number];  c. 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 Peripheral neuropathy   . Obesity, unspecified   . Esophageal reflux   . Diabetes mellitus, type II   . Diaphragmatic hernia without mention of obstruction or gangrene   . Irritable bowel syndrome   . Morbid obesity   . Cellulitis     a. 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 Stroke    2013-'no residual'  . Vertigo   . Dry skin   . Constipation   . Tobacco abuse    Past Surgical History  Procedure Laterality Date  . Invasive electrophysiologic study  5/09    followed by insertion of an implantable loop recorder. S/p removal   . Colonoscopy    . Cholecystectomy    . Multiple toe surgeries    . Cardiac catheterization      LAD 30%, circumflex 50%, OM 75%, RI 60% with small branch 80%, dominant RCA 60%, EF 45-50%  . I&d extremity  12/23/2011    Procedure: IRRIGATION AND DEBRIDEMENT EXTREMITY;  Surgeon: Augustin Schooling, MD;  Location: [place];  Service: Orthopedics;  Laterality: Left;  Left Foot  . I&d extremity  12/26/2011    Procedure: IRRIGATION AND DEBRIDEMENT EXTREMITY;  Surgeon: Wylene Simmer, MD;  Location: [place];  Service: Orthopedics;  Laterality: Left;  LEFT FOOT I&D WITH POSSIBLE WOUND VAC APPLICATION, POSSIBLE LEFT FIRST RAY AMPUTATION  . Amputation  12/30/2011    Procedure: AMPUTATION RAY;  Surgeon: Wylene Simmer, MD;  Location: [place];  Service: Orthopedics;  Laterality: Left;  LEFT FIRST RAY AMPUTATION  . Eye surgery      cataract  . Amputation  01/13/2012    Procedure: AMPUTATION BELOW KNEE;  Surgeon: Wylene Simmer, MD;  Location: [place];  Service: Orthopedics;  Laterality: Left;  .  Amputation  03/04/2012    Procedure: AMPUTATION BELOW KNEE;  Surgeon: Wylene Simmer, MD;  Location: [place];  Service: Orthopedics;  Laterality: Left;  Revision of Left Below Knee Amputation  . Tee without cardioversion N/A 07/07/2012    Procedure: TRANSESOPHAGEAL ECHOCARDIOGRAM (TEE);  Surgeon: Lelon Perla, MD;  Location: [facility] ENDOSCOPY;  Service: Cardiovascular;  Laterality: N/A;   Family History  Problem Relation Age of Onset  . Pneumonia Mother   . Gallbladder disease Mother     cancer  . Heart failure Mother   . Diabetes Father   . Coronary artery disease Father   . Stroke Neg Hx    History  Substance Use Topics  . Smoking status: Current Every Day Smoker -- 0.25 packs/day for  40 years    Types: Cigarettes  . Smokeless tobacco: Never Used  . 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   OB History   Grav Para Term Preterm Abortions TAB SAB Ect Mult Living                 Review of Systems  All other systems reviewed and are negative.     Allergies  Banana; Penicillins; and Strawberry  Home Medications   Prior to Admission medications   Medication Sig Start Date End Date Taking? Authorizing Provider  acetaminophen (TYLENOL) 500 MG tablet Take 1,000 mg by mouth daily as needed (pain).   Yes Historical Provider, MD  aspirin EC 81 MG tablet Take 81 mg by mouth daily as needed for mild pain.   Yes Historical Provider, MD  dipyridamole (PERSANTINE) 25 MG tablet Take 25 mg by mouth 2 (two) times daily.   Yes Historical Provider, MD  glimepiride (AMARYL) 4 MG tablet Take 4 mg by mouth daily with breakfast.   Yes Historical Provider, MD  hydrALAZINE (APRESOLINE) 25 MG tablet Take 25 mg by mouth 4 (four) times daily.   Yes Historical Provider, MD  hydrochlorothiazide (HYDRODIURIL) 25 MG tablet Take 25 mg by mouth every morning.   Yes Historical Provider, MD  insulin glargine (LANTUS) 100 UNIT/ML injection Inject 0.25 mLs (25 Units total) into the skin at bedtime. 01/21/13  Yes Daniel J Angiulli, PA-C  lisinopril (PRINIVIL,ZESTRIL) 20 MG tablet Take 1 tablet (20 mg total) by mouth daily. 01/21/13  Yes Daniel J Angiulli, PA-C  lisinopril (PRINIVIL,ZESTRIL) 40 MG tablet Take 40 mg by mouth at bedtime.   Yes Historical Provider, MD  mometasone (NASONEX) 50 MCG/ACT nasal spray Place 2 sprays into the nose daily as needed (allergies).   Yes Historical Provider, MD  simvastatin (ZOCOR) 80 MG tablet Take 1 tablet (80 mg total) by mouth every evening. 01/21/13  Yes Daniel J Angiulli, PA-C  aspirin 325 MG EC tablet Take 325 mg by mouth daily. Takes along with Dipyridamole    Historical Provider,  MD   BP 134/65  Pulse 88  Temp(Src) 97.5 F (36.4 C)  Resp 17  Ht 5\' 6"  (1.676 m)  Wt 157 lb (71.215 kg)  BMI 25.35 kg/m2  SpO2 100% Physical Exam  Nursing note and vitals reviewed. Constitutional: She is oriented to person, place, and time. She appears well-developed.  Appears older than stated age  HENT:  Head: Normocephalic and atraumatic.  Eyes: Conjunctivae and EOM are normal. Pupils are equal, round, and reactive to light.  Neck: Normal range of motion and phonation normal.  Neck supple.  Cardiovascular: Normal rate, regular rhythm and intact distal pulses.   Pulmonary/Chest: Effort normal and breath sounds normal. She exhibits no tenderness.  Abdominal: Soft. She exhibits no distension. There is no tenderness. There is no guarding.  Musculoskeletal: Normal range of motion.  Left leg, BKA  Neurological: She is alert and oriented to person, place, and time. No cranial nerve deficit. She exhibits normal muscle tone. Coordination normal.  No dysarthria, aphasia or nystagmus. Normal strength in arms, and legs, bilaterally  Skin: Skin is warm and dry.  Psychiatric: She has a normal mood and affect. Her behavior is normal. Judgment and thought content normal.    ED Course  Procedures (including critical care time) Medications - No data to display  Patient Vitals for the past 24 hrs:  BP Temp Pulse Resp SpO2 Height Weight  09/30/13 2130 134/65 mmHg - 88 17 100 % - -  09/30/13 2100 133/71 mmHg - 82 17 100 % - -  09/30/13 2000 141/76 mmHg - 79 13 100 % - -  09/30/13 1900 131/73 mmHg - 87 17 100 % - -  09/30/13 1745 118/61 mmHg - 85 19 100 % - -  09/30/13 1705 136/63 mmHg - - 15 100 % - -  09/30/13 1700 136/63 mmHg - 87 18 100 % - -  09/30/13 1600 119/63 mmHg - 87 16 100 % - -  09/30/13 1550 118/57 mmHg 97.5 F (36.4 C) 88 16 100 % 5\' 6"  (1.676 m) 157 lb (71.215 kg)  09/30/13 1547 - - - - 100 % - -       Labs Review Labs Reviewed  BASIC METABOLIC PANEL - Abnormal;  Notable for the following:    Glucose, Bld 126 (*)    GFR calc non Af Amer 59 (*)    GFR calc Af Amer 68 (*)    All other components within normal limits  URINALYSIS, ROUTINE W REFLEX MICROSCOPIC - Abnormal; Notable for the following:    Leukocytes, UA SMALL (*)    All other components within normal limits  URINE CULTURE  CBC WITH DIFFERENTIAL  URINE MICROSCOPIC-ADD ON    Imaging Review Dg Chest 2 View  09/30/2013   CLINICAL DATA:  Headache. Dizziness. Hypertension and diabetes. Tobacco use.  EXAM: CHEST  2 VIEW  COMPARISON:  01/10/2013  FINDINGS: Atherosclerotic aortic arch.  Mild thoracic spondylosis.  The lungs appear clear. Heart size within normal limits. No pleural effusion.  IMPRESSION: 1. No acute findings. 2. Atherosclerotic aortic arch.   Electronically Signed   By: Sherryl Barters M.D.   On: 09/30/2013 18:34   Ct Head Wo Contrast  09/30/2013   CLINICAL DATA:  The patient became lethargic at 3 o'clock today.  EXAM: CT HEAD WITHOUT CONTRAST  TECHNIQUE: Contiguous axial images were obtained from the base of the skull through the vertex without intravenous contrast.  COMPARISON:  July 08, 2013  FINDINGS: There is chronic diffuse atrophy. Chronic bilateral periventricular white matter small vessel ischemic changes identified. There are small old lacune infarctions in the left basal ganglia and right thalamus unchanged. There is no midline shift, hydrocephalus, or mass. No acute hemorrhage or acute transcortical infarct is identified. The bony calvarium is intact. There is minimal mucoperiosteal thickening of the left ethmoid sinus.  IMPRESSION: No focal acute intracranial abnormality identified. Stable chronic changes.   Electronically Signed   By: Abelardo Diesel M.D.   On: 09/30/2013 18:23     EKG Interpretation   Date/Time:  Friday Sep 30 2013 17:38:11 EDT Ventricular Rate:  85 PR Interval:  216 QRS Duration: 84 QT Interval:  392 QTC Calculation: 466 R Axis:   40 Text  Interpretation:  Sinus rhythm Prolonged PR interval Anteroseptal  infarct, old Nonspecific T abnormalities, lateral leads Since last tracing  of earlier today No significant change was found Confirmed by Eulis Foster  MD,  Boleslaus Holloway [phone]) on 09/30/2013 9:25:29 PM      MDM   Final diagnoses:  Fatigue    Nursing Notes Reviewed/ Care Coordinated Applicable Imaging Reviewed Interpretation of Laboratory Data incorporated into ED treatment  The patient appears reasonably screened and/or stabilized for discharge and I doubt any other medical condition or other [facility] requiring further screening, evaluation, or treatment in the ED at this time prior to discharge.  Plan: Home Medications- usual; Home Treatments- rest; return here if the recommended treatment, does not improve the symptoms; Recommended follow up- PCP prn     Richarda Blade, MD 10/01/13 [phone]

## 2013-09-30 NOTE — Discharge Instructions (Signed)
Get plenty of rest, drink a lot of fluids, and take your medicines as usual. See your Dr. for checkup next week.    Fatigue Fatigue is a feeling of tiredness, lack of energy, lack of motivation, or feeling tired all the time. Having enough rest, good nutrition, and reducing stress will normally reduce fatigue. Consult your caregiver if it persists. The nature of your fatigue will help your caregiver to find out its cause. The treatment is based on the cause.  CAUSES  There are many causes for fatigue. Most of the time, fatigue can be traced to one or more of your habits or routines. Most causes fit into one or more of three general areas. They are: Lifestyle problems  Sleep disturbances.  Overwork.  Physical exertion.  Unhealthy habits.  Poor eating habits or eating disorders.  Alcohol and/or drug use .  Lack of proper nutrition (malnutrition). Psychological problems  Stress and/or anxiety problems.  Depression.  Grief.  Boredom. Medical Problems or Conditions  Anemia.  Pregnancy.  Thyroid gland problems.  Recovery from major surgery.  Continuous pain.  Emphysema or asthma that is not well controlled  Allergic conditions.  Diabetes.  Infections (such as mononucleosis).  Obesity.  Sleep disorders, such as sleep apnea.  Heart failure or other heart-related problems.  Cancer.  Kidney disease.  Liver disease.  Effects of certain medicines such as antihistamines, cough and cold remedies, prescription pain medicines, heart and blood pressure medicines, drugs used for treatment of cancer, and some antidepressants. SYMPTOMS  The symptoms of fatigue include:   Lack of energy.  Lack of drive (motivation).  Drowsiness.  Feeling of indifference to the surroundings. DIAGNOSIS  The details of how you feel help guide your caregiver in finding out what is causing the fatigue. You will be asked about your present and past health condition. It is important  to review all medicines that you take, including prescription and non-prescription items. A thorough exam will be done. You will be questioned about your feelings, habits, and normal lifestyle. Your caregiver may suggest blood tests, urine tests, or other tests to look for common medical causes of fatigue.  TREATMENT  Fatigue is treated by correcting the underlying cause. For example, if you have continuous pain or depression, treating these causes will improve how you feel. Similarly, adjusting the dose of certain medicines will help in reducing fatigue.  HOME CARE INSTRUCTIONS   Try to get the required amount of good sleep every night.  Eat a healthy and nutritious diet, and drink enough water throughout the day.  Practice ways of relaxing (including yoga or meditation).  Exercise regularly.  Make plans to change situations that cause stress. Act on those plans so that stresses decrease over time. Keep your work and personal routine reasonable.  Avoid street drugs and minimize use of alcohol.  Start taking a daily multivitamin after consulting your caregiver. SEEK MEDICAL CARE IF:   You have persistent tiredness, which cannot be accounted for.  You have fever.  You have unintentional weight loss.  You have headaches.  You have disturbed sleep throughout the night.  You are feeling sad.  You have constipation.  You have dry skin.  You have gained weight.  You are taking any new or different medicines that you suspect are causing fatigue.  You are unable to sleep at night.  You develop any unusual swelling of your legs or other parts of your body. SEEK IMMEDIATE MEDICAL CARE IF:   You are feeling  confused.  Your vision is blurred.  You feel faint or pass out.  You develop severe headache.  You develop severe abdominal, pelvic, or back pain.  You develop chest pain, shortness of breath, or an irregular or fast heartbeat.  You are unable to pass a normal amount  of urine.  You develop abnormal bleeding such as bleeding from the rectum or you vomit blood.  You have thoughts about harming yourself or committing suicide.  You are worried that you might harm someone else. MAKE SURE YOU:   Understand these instructions.  Will watch your condition.  Will get help right away if you are not doing well or get worse. Document Released: 03/02/2007 Document Revised: 07/28/2011 Document Reviewed: 03/02/2007 [facility] Patient Information 2014 [place].

## 2013-09-30 NOTE — ED Notes (Signed)
Per EMS: Family reports that at 1500 pt became "lethargic" while talking to family. Pt would nod her head but would not respond to any questions, upon arrival EMS reports pt was axo x4, and has continued to be axo en route. Pt reports left shoulder, neck, and left arm pain that has been going on for couple of months. Denies any fevers at home. EMS reports no neuro deficits, EKG unremarkable. nad noted. Skin warm and dry.

## 2013-10-02 LAB — URINE CULTURE

## 2013-10-03 ENCOUNTER — Telehealth (HOSPITAL_BASED_OUTPATIENT_CLINIC_OR_DEPARTMENT_OTHER): Payer: Self-pay | Admitting: Emergency Medicine

## 2013-10-03 NOTE — Telephone Encounter (Signed)
Post ED Visit - Positive Culture Follow-up  Culture report reviewed by antimicrobial stewardship pharmacist: []  Wes Dulaney, Pharm.D., BCPS []  Heide Guile, Pharm.D., BCPS []  Alycia Rossetti, Pharm.D., BCPS [x]  [place], [place].D., BCPS, AAHIVP []  Legrand Como, Pharm.D., BCPS, AAHIVP []  Juliene Pina, Pharm.D.  Positive urine culture Per [facility] NP, no treatment needed and no further patient follow-up is required at this time.  Kuulei Kleier 10/03/2013, 2:35 PM

## 2013-10-07 ENCOUNTER — Encounter (HOSPITAL_COMMUNITY): Payer: Self-pay | Admitting: Emergency Medicine

## 2013-10-07 ENCOUNTER — Emergency Department (HOSPITAL_COMMUNITY)
Admission: EM | Admit: 2013-10-07 | Discharge: 2013-10-07 | Disposition: A | Discharge status: Home / Self Care | Payer: BC Managed Care – PPO | Attending: Emergency Medicine | Admitting: Emergency Medicine

## 2013-10-07 DIAGNOSIS — Z794 Long term (current) use of insulin: Secondary | ICD-10-CM | POA: Diagnosis not present

## 2013-10-07 DIAGNOSIS — S88119A Complete traumatic amputation at level between knee and ankle, unspecified lower leg, initial encounter: Secondary | ICD-10-CM | POA: Insufficient documentation

## 2013-10-07 DIAGNOSIS — Z79899 Other long term (current) drug therapy: Secondary | ICD-10-CM | POA: Insufficient documentation

## 2013-10-07 DIAGNOSIS — F17200 Nicotine dependence, unspecified, uncomplicated: Secondary | ICD-10-CM | POA: Diagnosis not present

## 2013-10-07 DIAGNOSIS — E119 Type 2 diabetes mellitus without complications: Secondary | ICD-10-CM | POA: Insufficient documentation

## 2013-10-07 DIAGNOSIS — R51 Headache: Secondary | ICD-10-CM | POA: Diagnosis present

## 2013-10-07 DIAGNOSIS — Z8719 Personal history of other diseases of the digestive system: Secondary | ICD-10-CM | POA: Insufficient documentation

## 2013-10-07 DIAGNOSIS — Z872 Personal history of diseases of the skin and subcutaneous tissue: Secondary | ICD-10-CM | POA: Insufficient documentation

## 2013-10-07 DIAGNOSIS — Z880 Allergy status to penicillin: Secondary | ICD-10-CM | POA: Insufficient documentation

## 2013-10-07 DIAGNOSIS — Z9889 Other specified postprocedural states: Secondary | ICD-10-CM | POA: Diagnosis not present

## 2013-10-07 DIAGNOSIS — M79603 Pain in arm, unspecified: Secondary | ICD-10-CM

## 2013-10-07 DIAGNOSIS — M79609 Pain in unspecified limb: Secondary | ICD-10-CM | POA: Diagnosis not present

## 2013-10-07 DIAGNOSIS — I10 Essential (primary) hypertension: Secondary | ICD-10-CM | POA: Diagnosis not present

## 2013-10-07 DIAGNOSIS — E785 Hyperlipidemia, unspecified: Secondary | ICD-10-CM | POA: Insufficient documentation

## 2013-10-07 DIAGNOSIS — R519 Headache, unspecified: Secondary | ICD-10-CM

## 2013-10-07 DIAGNOSIS — Z8673 Personal history of transient ischemic attack (TIA), and cerebral infarction without residual deficits: Secondary | ICD-10-CM | POA: Insufficient documentation

## 2013-10-07 LAB — BASIC METABOLIC PANEL
BUN: 24 mg/dL — AB (ref 6–23)
CHLORIDE: 96 meq/L (ref 96–112)
CO2: 27 mEq/L (ref 19–32)
Calcium: 9.2 mg/dL (ref 8.4–10.5)
Creatinine, Ser: 0.95 mg/dL (ref 0.50–1.10)
GFR calc non Af Amer: 65 mL/min — ABNORMAL LOW (ref >90)
GFR, EST AFRICAN AMERICAN: 75 mL/min — AB (ref >90)
GLUCOSE: 150 mg/dL — AB (ref 70–99)
POTASSIUM: 3.8 meq/L (ref 3.7–5.3)
Sodium: 137 mEq/L (ref 137–147)

## 2013-10-07 LAB — CBC WITH DIFFERENTIAL/PLATELET
BASOS PCT: 0 % (ref 0–1)
Basophils Absolute: 0 10*3/uL (ref 0.0–0.1)
Eosinophils Absolute: 0 10*3/uL (ref 0.0–0.7)
Eosinophils Relative: 0 % (ref 0–5)
HCT: 41.7 % (ref 36.0–46.0)
HEMOGLOBIN: 13.5 g/dL (ref 12.0–15.0)
LYMPHS ABS: 1.6 10*3/uL (ref 0.7–4.0)
Lymphocytes Relative: 15 % (ref 12–46)
MCH: 26.4 pg (ref 26.0–34.0)
MCHC: 32.4 g/dL (ref 30.0–36.0)
MCV: 81.6 fL (ref 78.0–100.0)
Monocytes Absolute: 0.4 10*3/uL (ref 0.1–1.0)
Monocytes Relative: 4 % (ref 3–12)
NEUTROS ABS: 8.7 10*3/uL — AB (ref 1.7–7.7)
NEUTROS PCT: 81 % — AB (ref 43–77)
Platelets: 238 10*3/uL (ref 150–400)
RBC: 5.11 MIL/uL (ref 3.87–5.11)
RDW: 13.2 % (ref 11.5–15.5)
WBC: 10.8 10*3/uL — AB (ref 4.0–10.5)

## 2013-10-07 MED ORDER — IBUPROFEN 800 MG PO TABS: 800.0000 mg | ORAL_TABLET | Freq: Three times a day (TID) | ORAL | Status: DC

## 2013-10-07 MED ORDER — IBUPROFEN 200 MG PO TABS
600.0000 mg | ORAL_TABLET | Freq: Once | ORAL | Status: AC
  Administered 2013-10-07: 600 mg via ORAL
  Filled 2013-10-07: qty 3

## 2013-10-07 NOTE — Discharge Instructions (Signed)
Cervical Radiculopathy Cervical radiculopathy happens when a nerve in the neck is pinched or bruised by a slipped (herniated) disk or by arthritic changes in the bones of the cervical spine. This can occur due to an injury or as part of the normal aging process. Pressure on the cervical nerves can cause pain or numbness that runs from your neck all the way down into your arm and fingers. CAUSES  There are many possible causes, including:  Injury.  Muscle tightness in the neck from overuse.  Swollen, painful joints (arthritis).  Breakdown or degeneration in the bones and joints of the spine (spondylosis) due to aging.  Bone spurs that may develop near the cervical nerves. SYMPTOMS  Symptoms include pain, weakness, or numbness in the affected arm and hand. Pain can be severe or irritating. Symptoms may be worse when extending or turning the neck. DIAGNOSIS  Your caregiver will ask about your symptoms and do a physical exam. He or she may test your strength and reflexes. X-rays, CT scans, and MRI scans may be needed in cases of injury or if the symptoms do not go away after a period of time. Electromyography (EMG) or nerve conduction testing may be done to study how your nerves and muscles are working. TREATMENT  Your caregiver may recommend certain exercises to help relieve your symptoms. Cervical radiculopathy can, and often does, get better with time and treatment. If your problems continue, treatment options may include:  Wearing a soft collar for short periods of time.  Physical therapy to strengthen the neck muscles.  Medicines, such as nonsteroidal anti-inflammatory drugs (NSAIDs), oral corticosteroids, or spinal injections.  Surgery. Different types of surgery may be done depending on the cause of your problems. HOME CARE INSTRUCTIONS   Put ice on the affected area.  Put ice in a plastic bag.  Place a towel between your skin and the bag.  Leave the ice on for 15-20 minutes,  03-04 times a day or as directed by your caregiver.  If ice does not help, you can try using heat. Take a warm shower or bath, or use a hot water bottle as directed by your caregiver.  You may try a gentle neck and shoulder massage.  Use a flat pillow when you sleep.  Only take over-the-counter or prescription medicines for pain, discomfort, or fever as directed by your caregiver.  If physical therapy was prescribed, follow your caregiver's directions.  If a soft collar was prescribed, use it as directed. SEEK IMMEDIATE MEDICAL CARE IF:   Your pain gets much worse and cannot be controlled with medicines.  You have weakness or numbness in your hand, arm, face, or leg.  You have a high fever or a stiff, rigid neck.  You lose bowel or bladder control (incontinence).  You have trouble with walking, balance, or speaking. MAKE SURE YOU:   Understand these instructions.  Will watch your condition.  Will get help right away if you are not doing well or get worse. Document Released: 01/28/2001 Document Revised: 07/28/2011 Document Reviewed: 12/17/2010 [facility] Patient Information 2014 [place], [place]. Tension Headache A tension headache is a feeling of pain, pressure, or aching often felt over the front and sides of the head. The pain can be dull or can feel tight (constricting). It is the most common type of headache. Tension headaches are not normally associated with nausea or vomiting and do not get worse with physical activity. Tension headaches can last 30 minutes to several days.  CAUSES  The exact cause is not known, but it may be caused by chemicals and hormones in the brain that lead to pain. Tension headaches often begin after stress, anxiety, or depression. Other triggers may include:  Alcohol.  Caffeine (too much or withdrawal).  Respiratory infections (colds, flu, sinus infections).  Dental problems or teeth clenching.  Fatigue.  Holding your head and neck in one  position too long while using a computer. SYMPTOMS   Pressure around the head.   Dull, aching head pain.   Pain felt over the front and sides of the head.   Tenderness in the muscles of the head, neck, and shoulders. DIAGNOSIS  A tension headache is often diagnosed based on:   Symptoms.   Physical examination.   A CT scan or MRI of your head. These tests may be ordered if symptoms are severe or unusual. TREATMENT  Medicines may be given to help relieve symptoms.  HOME CARE INSTRUCTIONS   Only take over-the-counter or prescription medicines for pain or discomfort as directed by your caregiver.   Lie down in a dark, quiet room when you have a headache.   Keep a journal to find out what may be triggering your headaches. For example, write down:  What you eat and drink.  How much sleep you get.  Any change to your diet or medicines.  Try massage or other relaxation techniques.   Ice packs or heat applied to the head and neck can be used. Use these 3 to 4 times per day for 15 to 20 minutes each time, or as needed.   Limit stress.   Sit up straight, and do not tense your muscles.   Quit smoking if you smoke.  Limit alcohol use.  Decrease the amount of caffeine you drink, or stop drinking caffeine.  Eat and exercise regularly.  Get 7 to 9 hours of sleep, or as recommended by your caregiver.  Avoid excessive use of pain medicine as recurrent headaches can occur.  SEEK MEDICAL CARE IF:   You have problems with the medicines you were prescribed.  Your medicines do not work.  You have a change from the usual headache.  You have nausea or vomiting. SEEK IMMEDIATE MEDICAL CARE IF:   Your headache becomes severe.  You have a fever.  You have a stiff neck.  You have loss of vision.  You have muscular weakness or loss of muscle control.  You lose your balance or have trouble walking.  You feel faint or pass out.  You have severe symptoms that  are different from your first symptoms. MAKE SURE YOU:   Understand these instructions.  Will watch your condition.  Will get help right away if you are not doing well or get worse. Document Released: 05/05/2005 Document Revised: 07/28/2011 Document Reviewed: 04/25/2011 [facility] Patient Information 2014 [place], [place].

## 2013-10-07 NOTE — ED Notes (Signed)
Bed: SF68 Expected date:  Expected time:  Means of arrival:  Comments: EMS- 58yo F, headache

## 2013-10-07 NOTE — ED Notes (Addendum)
Pt from home via [facility] c/o  A headache to forehead and left side of head that began this am. She took two 325 mg ASA without relief. Pt denies fall or hitting head on anything. Pt was seen at [facility] for same on the 15th. Pt reports feeling some better but headache has come back. Negative stroke screen.

## 2013-10-07 NOTE — ED Notes (Addendum)
Pt feeling nauseated. Emesis bag provided.

## 2013-10-07 NOTE — ED Notes (Signed)
Pt assisted onto bedpan. [name] has foul smell.

## 2013-10-07 NOTE — ED Provider Notes (Signed)
CSN: [id number]     Arrival date & time 10/07/13  1221 History   First MD Initiated Contact with Patient 10/07/13 1309     Chief Complaint  Patient presents with  . Headache     (Consider location/radiation/quality/duration/timing/severity/associated sxs/prior Treatment) HPI Comments: She presents emergency department with chief complaint of headache, left arm pain. She states that the headache began this morning, and has gradually worsened. She now states that it is gradually improving without treatment. Denies fevers, chills, nausea, or vomiting. She was seen here 3 days ago for the same symptoms. At that time, had a negative CT scan, negative workup, and was discharged to home with primary care followup. There no aggravating or alleviating factors. She denies any new weakness, numbness, or tingling. She denies any chest pain, shortness of breath, or abdominal pain.  The history is provided by the patient. No language interpreter was used.    Past Medical History  Diagnosis Date  . Atypical chest pain     a. non obstructive by cath in 2008 and 2010;  b. 02/2012 Myoview: non-ischemic, EF 57%  . HTN (hypertension)   . Hyperlipidemia   . Syncope and collapse     a. near-syncopal episode in November 2008;  b. 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  . CVA (cerebral infarction)     a.  Small right parietal noted incidentally 04/2007;  b. right sided embolic CVA 09/91;  c. 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 Peripheral neuropathy   . Obesity, unspecified   . Esophageal reflux   . Diabetes mellitus, type II   . Diaphragmatic hernia without mention of obstruction or gangrene   . Irritable bowel syndrome   . Morbid obesity   . Cellulitis     a. 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 Stroke     2013-'no residual'  . Vertigo   . Dry skin   . Constipation   . Tobacco abuse    Past Surgical  History  Procedure Laterality Date  . Invasive electrophysiologic study  5/09    followed by insertion of an implantable loop recorder. S/p removal   . Colonoscopy    . Cholecystectomy    . Multiple toe surgeries    . Cardiac catheterization      LAD 30%, circumflex 50%, OM 75%, RI 60% with small branch 80%, dominant RCA 60%, EF 45-50%  . I&d extremity  12/23/2011    Procedure: IRRIGATION AND DEBRIDEMENT EXTREMITY;  Surgeon: Augustin Schooling, MD;  Location: [place];  Service: Orthopedics;  Laterality: Left;  Left Foot  . I&d extremity  12/26/2011    Procedure: IRRIGATION AND DEBRIDEMENT EXTREMITY;  Surgeon: Wylene Simmer, MD;  Location: [place];  Service: Orthopedics;  Laterality: Left;  LEFT FOOT I&D WITH POSSIBLE WOUND VAC APPLICATION, POSSIBLE LEFT FIRST RAY AMPUTATION  . Amputation  12/30/2011    Procedure: AMPUTATION RAY;  Surgeon: Wylene Simmer, MD;  Location: [place];  Service: Orthopedics;  Laterality: Left;  LEFT FIRST RAY AMPUTATION  . Eye surgery      cataract  . Amputation  01/13/2012    Procedure: AMPUTATION BELOW KNEE;  Surgeon: Wylene Simmer, MD;  Location: [place];  Service: Orthopedics;  Laterality: Left;  . Amputation  03/04/2012    Procedure: AMPUTATION BELOW KNEE;  Surgeon: Wylene Simmer, MD;  Location: [place];  Service: Orthopedics;  Laterality:  Left;  Revision of Left Below Knee Amputation  . Tee without cardioversion N/A 07/07/2012    Procedure: TRANSESOPHAGEAL ECHOCARDIOGRAM (TEE);  Surgeon: Lelon Perla, MD;  Location: [facility] ENDOSCOPY;  Service: Cardiovascular;  Laterality: N/A;   Family History  Problem Relation Age of Onset  . Pneumonia Mother   . Gallbladder disease Mother     cancer  . Heart failure Mother   . Diabetes Father   . Coronary artery disease Father   . Stroke Neg Hx    History  Substance Use Topics  . Smoking status: Current Every Day Smoker -- 0.25 packs/day for 40 years    Types: Cigarettes  . Smokeless tobacco: Never Used  . Alcohol Use: 2.4 oz/week    4  Cans of beer per week     Comment: occ- foot ball season- 4 beers a week during football season.   OB History   Grav Para Term Preterm Abortions TAB SAB Ect Mult Living                 Review of Systems  Constitutional: Negative for fever and chills.  Respiratory: Negative for shortness of breath.   Cardiovascular: Negative for chest pain.  Gastrointestinal: Negative for nausea, vomiting, diarrhea and constipation.  Genitourinary: Negative for dysuria.  Neurological: Positive for headaches. Negative for facial asymmetry.      Allergies  Banana; Penicillins; and Strawberry  Home Medications   Prior to Admission medications   Medication Sig Start Date End Date Taking? Authorizing Provider  glimepiride (AMARYL) 4 MG tablet Take 4 mg by mouth daily with breakfast.   Yes Historical Provider, MD  hydrALAZINE (APRESOLINE) 25 MG tablet Take 25 mg by mouth 4 (four) times daily.   Yes Historical Provider, MD  hydrochlorothiazide (HYDRODIURIL) 25 MG tablet Take 25 mg by mouth every morning.   Yes Historical Provider, MD  insulin glargine (LANTUS) 100 UNIT/ML injection Inject 25 Units into the skin at bedtime.   Yes Historical Provider, MD  simvastatin (ZOCOR) 80 MG tablet Take 1 tablet (80 mg total) by mouth every evening. 01/21/13  Yes Daniel J Angiulli, PA-C  lisinopril (PRINIVIL,ZESTRIL) 20 MG tablet Take 1 tablet (20 mg total) by mouth daily. 01/21/13   Daniel J Angiulli, PA-C   BP 150/81  Pulse 93  Temp(Src) 97.9 F (36.6 C) (Oral)  Resp 20  SpO2 100% Physical Exam  Nursing note and vitals reviewed. Constitutional: She is oriented to person, place, and time. She appears well-developed and well-nourished.  HENT:  Head: Normocephalic and atraumatic.  Right Ear: External ear normal.  Left Ear: External ear normal.  Eyes: Conjunctivae and EOM are normal. Pupils are equal, round, and reactive to light.  Neck: Normal range of motion. Neck supple.  No pain with neck flexion, no  meningismus  Cardiovascular: Normal rate, regular rhythm and normal heart sounds.  Exam reveals no gallop and no friction rub.   No murmur heard. Pulmonary/Chest: Effort normal and breath sounds normal. No respiratory distress. She has no wheezes. She has no rales. She exhibits no tenderness.  Abdominal: Soft. She exhibits no distension and no mass. There is no tenderness. There is no rebound and no guarding.  Musculoskeletal: Normal range of motion. She exhibits no edema and no tenderness.  Normal gait.  Left cervical paraspinal muscles and left upper trapezius muscles are tender to palpation  Positive spurling.    Neurological: She is alert and oriented to person, place, and time. She has normal reflexes.  CN  3-12 intact, speech is clear, movement is goal oriented, sensation and strength intact bilaterally.  Skin: Skin is warm and dry.  Psychiatric: She has a normal mood and affect. Her behavior is normal. Judgment and thought content normal.    ED Course  Procedures (including critical care time) Labs Review Labs Reviewed  CBC WITH DIFFERENTIAL  BASIC METABOLIC PANEL    Imaging Review No results found.   EKG Interpretation None      MDM   Final diagnoses:  Headache  Arm pain    Patient with headache. Has been ongoing for the past couple days. She was seen and evaluated about 5 days ago for the same. Patient states that the headache has been improving. She states that she feels better. She also complains of left arm pain, which she describes as radiating. This pain is made better when the arm is placed on the head. She has a positive Spurling. Suspect cervical radiculopathy, probable tension. Will treat with NSAIDs. Specific return precautions have been given. Patient understands and agrees with plan. She is stable and ready for discharge.    Montine Circle, PA-C 10/07/13 1531

## 2013-10-07 NOTE — ED Provider Notes (Signed)
Medical screening examination/treatment/procedure(s) were performed by non-physician practitioner and as supervising physician I was immediately available for consultation/collaboration.   EKG Interpretation None        Merryl Hacker, MD 10/07/13 [phone]

## 2013-10-28 ENCOUNTER — Inpatient Hospital Stay (HOSPITAL_COMMUNITY)
Admission: EM | Admit: 2013-10-28 | Discharge: 2013-11-04 | DRG: 616 | Disposition: A | Discharge status: Skilled Nursing Facility | Payer: Medicaid Other | Attending: Internal Medicine | Admitting: Internal Medicine

## 2013-10-28 ENCOUNTER — Encounter (HOSPITAL_COMMUNITY): Payer: Self-pay | Admitting: Emergency Medicine

## 2013-10-28 ENCOUNTER — Emergency Department (HOSPITAL_COMMUNITY): Payer: Medicaid Other

## 2013-10-28 DIAGNOSIS — I2510 Atherosclerotic heart disease of native coronary artery without angina pectoris: Secondary | ICD-10-CM

## 2013-10-28 DIAGNOSIS — D72829 Elevated white blood cell count, unspecified: Secondary | ICD-10-CM

## 2013-10-28 DIAGNOSIS — Z833 Family history of diabetes mellitus: Secondary | ICD-10-CM

## 2013-10-28 DIAGNOSIS — Z8673 Personal history of transient ischemic attack (TIA), and cerebral infarction without residual deficits: Secondary | ICD-10-CM

## 2013-10-28 DIAGNOSIS — L97519 Non-pressure chronic ulcer of other part of right foot with unspecified severity: Secondary | ICD-10-CM

## 2013-10-28 DIAGNOSIS — G8190 Hemiplegia, unspecified affecting unspecified side: Secondary | ICD-10-CM

## 2013-10-28 DIAGNOSIS — E876 Hypokalemia: Secondary | ICD-10-CM

## 2013-10-28 DIAGNOSIS — S88111D Complete traumatic amputation at level between knee and ankle, right lower leg, subsequent encounter: Secondary | ICD-10-CM

## 2013-10-28 DIAGNOSIS — E785 Hyperlipidemia, unspecified: Secondary | ICD-10-CM

## 2013-10-28 DIAGNOSIS — E1169 Type 2 diabetes mellitus with other specified complication: Principal | ICD-10-CM

## 2013-10-28 DIAGNOSIS — L02619 Cutaneous abscess of unspecified foot: Secondary | ICD-10-CM | POA: Diagnosis present

## 2013-10-28 DIAGNOSIS — E669 Obesity, unspecified: Secondary | ICD-10-CM

## 2013-10-28 DIAGNOSIS — E860 Dehydration: Secondary | ICD-10-CM

## 2013-10-28 DIAGNOSIS — I739 Peripheral vascular disease, unspecified: Secondary | ICD-10-CM | POA: Diagnosis present

## 2013-10-28 DIAGNOSIS — S88119A Complete traumatic amputation at level between knee and ankle, unspecified lower leg, initial encounter: Secondary | ICD-10-CM

## 2013-10-28 DIAGNOSIS — D649 Anemia, unspecified: Secondary | ICD-10-CM

## 2013-10-28 DIAGNOSIS — Z720 Tobacco use: Secondary | ICD-10-CM

## 2013-10-28 DIAGNOSIS — F17200 Nicotine dependence, unspecified, uncomplicated: Secondary | ICD-10-CM | POA: Diagnosis present

## 2013-10-28 DIAGNOSIS — R079 Chest pain, unspecified: Secondary | ICD-10-CM

## 2013-10-28 DIAGNOSIS — R072 Precordial pain: Secondary | ICD-10-CM

## 2013-10-28 DIAGNOSIS — A480 Gas gangrene: Secondary | ICD-10-CM

## 2013-10-28 DIAGNOSIS — L03115 Cellulitis of right lower limb: Secondary | ICD-10-CM

## 2013-10-28 DIAGNOSIS — R739 Hyperglycemia, unspecified: Secondary | ICD-10-CM

## 2013-10-28 DIAGNOSIS — L02611 Cutaneous abscess of right foot: Secondary | ICD-10-CM

## 2013-10-28 DIAGNOSIS — I10 Essential (primary) hypertension: Secondary | ICD-10-CM

## 2013-10-28 DIAGNOSIS — K589 Irritable bowel syndrome without diarrhea: Secondary | ICD-10-CM

## 2013-10-28 DIAGNOSIS — E1142 Type 2 diabetes mellitus with diabetic polyneuropathy: Secondary | ICD-10-CM | POA: Diagnosis present

## 2013-10-28 DIAGNOSIS — L03116 Cellulitis of left lower limb: Secondary | ICD-10-CM

## 2013-10-28 DIAGNOSIS — L03119 Cellulitis of unspecified part of limb: Secondary | ICD-10-CM

## 2013-10-28 DIAGNOSIS — K219 Gastro-esophageal reflux disease without esophagitis: Secondary | ICD-10-CM | POA: Diagnosis present

## 2013-10-28 DIAGNOSIS — I96 Gangrene, not elsewhere classified: Secondary | ICD-10-CM

## 2013-10-28 DIAGNOSIS — N390 Urinary tract infection, site not specified: Secondary | ICD-10-CM

## 2013-10-28 DIAGNOSIS — R55 Syncope and collapse: Secondary | ICD-10-CM

## 2013-10-28 DIAGNOSIS — L089 Local infection of the skin and subcutaneous tissue, unspecified: Secondary | ICD-10-CM

## 2013-10-28 DIAGNOSIS — E1140 Type 2 diabetes mellitus with diabetic neuropathy, unspecified: Secondary | ICD-10-CM | POA: Diagnosis present

## 2013-10-28 DIAGNOSIS — B3749 Other urogenital candidiasis: Secondary | ICD-10-CM

## 2013-10-28 DIAGNOSIS — E1165 Type 2 diabetes mellitus with hyperglycemia: Principal | ICD-10-CM | POA: Diagnosis present

## 2013-10-28 DIAGNOSIS — M726 Necrotizing fasciitis: Secondary | ICD-10-CM | POA: Diagnosis present

## 2013-10-28 DIAGNOSIS — E1149 Type 2 diabetes mellitus with other diabetic neurological complication: Secondary | ICD-10-CM | POA: Diagnosis present

## 2013-10-28 DIAGNOSIS — E11628 Type 2 diabetes mellitus with other skin complications: Secondary | ICD-10-CM | POA: Diagnosis present

## 2013-10-28 DIAGNOSIS — R202 Paresthesia of skin: Secondary | ICD-10-CM

## 2013-10-28 DIAGNOSIS — L97509 Non-pressure chronic ulcer of other part of unspecified foot with unspecified severity: Secondary | ICD-10-CM | POA: Diagnosis present

## 2013-10-28 DIAGNOSIS — E11649 Type 2 diabetes mellitus with hypoglycemia without coma: Secondary | ICD-10-CM

## 2013-10-28 DIAGNOSIS — IMO0002 Reserved for concepts with insufficient information to code with codable children: Principal | ICD-10-CM

## 2013-10-28 DIAGNOSIS — G609 Hereditary and idiopathic neuropathy, unspecified: Secondary | ICD-10-CM

## 2013-10-28 DIAGNOSIS — R29810 Facial weakness: Secondary | ICD-10-CM

## 2013-10-28 DIAGNOSIS — R200 Anesthesia of skin: Secondary | ICD-10-CM

## 2013-10-28 DIAGNOSIS — K449 Diaphragmatic hernia without obstruction or gangrene: Secondary | ICD-10-CM

## 2013-10-28 DIAGNOSIS — A419 Sepsis, unspecified organism: Secondary | ICD-10-CM | POA: Diagnosis present

## 2013-10-28 DIAGNOSIS — Z880 Allergy status to penicillin: Secondary | ICD-10-CM

## 2013-10-28 DIAGNOSIS — E11621 Type 2 diabetes mellitus with foot ulcer: Secondary | ICD-10-CM

## 2013-10-28 DIAGNOSIS — R42 Dizziness and giddiness: Secondary | ICD-10-CM

## 2013-10-28 DIAGNOSIS — M86179 Other acute osteomyelitis, unspecified ankle and foot: Secondary | ICD-10-CM

## 2013-10-28 LAB — BASIC METABOLIC PANEL
BUN: 13 mg/dL (ref 6–23)
CALCIUM: 9.1 mg/dL (ref 8.4–10.5)
CHLORIDE: 99 meq/L (ref 96–112)
CO2: 26 mEq/L (ref 19–32)
Creatinine, Ser: 0.72 mg/dL (ref 0.50–1.10)
GFR calc Af Amer: >90 mL/min (ref >90)
GFR calc non Af Amer: >90 mL/min (ref >90)
GLUCOSE: 175 mg/dL — AB (ref 70–99)
Potassium: 3.6 mEq/L — ABNORMAL LOW (ref 3.7–5.3)
SODIUM: 141 meq/L (ref 137–147)

## 2013-10-28 LAB — CBG MONITORING, ED
Glucose-Capillary: 148 mg/dL — ABNORMAL HIGH (ref 70–99)
Glucose-Capillary: 150 mg/dL — ABNORMAL HIGH (ref 70–99)

## 2013-10-28 LAB — CBC
HCT: 32.9 % — ABNORMAL LOW (ref 36.0–46.0)
HEMOGLOBIN: 10.5 g/dL — AB (ref 12.0–15.0)
MCH: 26.1 pg (ref 26.0–34.0)
MCHC: 31.9 g/dL (ref 30.0–36.0)
MCV: 81.8 fL (ref 78.0–100.0)
Platelets: 310 10*3/uL (ref 150–400)
RBC: 4.02 MIL/uL (ref 3.87–5.11)
RDW: 14.1 % (ref 11.5–15.5)
WBC: 15.8 10*3/uL — AB (ref 4.0–10.5)

## 2013-10-28 MED ORDER — VANCOMYCIN HCL IN DEXTROSE 1-5 GM/200ML-% IV SOLN: 1000.0000 mg | Freq: Once | INTRAVENOUS | Status: DC

## 2013-10-28 MED ORDER — VANCOMYCIN HCL IN DEXTROSE 1-5 GM/200ML-% IV SOLN
1000.0000 mg | Freq: Two times a day (BID) | INTRAVENOUS | Status: DC
  Administered 2013-10-29 – 2013-11-03 (×10): 1000 mg via INTRAVENOUS
  Filled 2013-10-28 (×12): qty 200

## 2013-10-28 MED ORDER — CLINDAMYCIN PHOSPHATE 600 MG/50ML IV SOLN
600.0000 mg | Freq: Three times a day (TID) | INTRAVENOUS | Status: DC
  Administered 2013-10-29 – 2013-11-03 (×16): 600 mg via INTRAVENOUS
  Filled 2013-10-28 (×20): qty 50

## 2013-10-28 MED ORDER — DEXTROSE 5 % IV SOLN
2.0000 g | Freq: Three times a day (TID) | INTRAVENOUS | Status: DC
  Administered 2013-10-29 – 2013-11-03 (×16): 2 g via INTRAVENOUS
  Filled 2013-10-28 (×19): qty 2

## 2013-10-28 NOTE — ED Notes (Signed)
Pt states she had pain, swelling and foul smell from her R foot this week and a headache today. She is alert and breathing easily

## 2013-10-28 NOTE — ED Notes (Signed)
Docherty, MD at bedside.  

## 2013-10-28 NOTE — Progress Notes (Signed)
ANTIBIOTIC CONSULT NOTE - INITIAL  Pharmacy Consult for Vancomycin/Aztreonam Indication: Gangrenous foot wound, poss necrotizing fascitis   Allergies  Allergen Reactions  . Banana Swelling and Other (See Comments)    Facial swelling  . Penicillins Itching, Swelling and Rash    Face swells and break out  . Strawberry Swelling and Other (See Comments)    Facial swelling   Patient Measurements: ~71.2 kg  Vital Signs: Temp: 98.9 F (37.2 C) (06/12 1943) Temp src: Oral (06/12 1943) BP: 156/73 mmHg (06/12 2253) Pulse Rate: 87 (06/12 2253)  Labs:  Recent Labs  10/28/13 2132  WBC 15.8*  HGB 10.5*  PLT 310  CREATININE 0.72   Medical History: Past Medical History  Diagnosis Date  . Atypical chest pain     a. non obstructive by cath in 2008 and 2010;  b. 02/2012 Myoview: non-ischemic, EF 57%  . HTN (hypertension)   . Hyperlipidemia   . Syncope and collapse     a. near-syncopal episode in November 2008;  b. 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  . CVA (cerebral infarction)     a.  Small right parietal noted incidentally 04/2007;  b. right sided embolic CVA 07/1538;  c. 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 Peripheral neuropathy   . Obesity, unspecified   . Esophageal reflux   . Diabetes mellitus, type II   . Diaphragmatic hernia without mention of obstruction or gangrene   . Irritable bowel syndrome   . Morbid obesity   . Cellulitis     a. 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 Stroke     2013-'no residual'  . Vertigo   . Dry skin   . Constipation   . Tobacco abuse    Assessment: 58 y/o F with gangrenous right foot wound with possible necrotizing fascitis. WBC elevated, renal function ok other labs as above. Noted PCN allergy with facial swelling, discussed with EDP.   Goal of Therapy:  Vancomycin trough level 15-20 mcg/ml  Plan:  -Vancomycin 1000 mg IV  q12h -Aztreonam 2g IV q8h -Clindamycin per MD -Trend WBC, temp, renal function  -Drug levels as indicated   Narda Bonds 10/28/2013,11:46 PM

## 2013-10-28 NOTE — Consult Note (Signed)
Reason for Consult: Right foot pain and swelling Referring Physician: EDP  Vaneza L Lucking is an 58 y.o. female.  HPI: Patient reports pain and swelling starting today. Loss of appetite  Past Medical History  Diagnosis Date  . Atypical chest pain     a. non obstructive by cath in 2008 and 2010;  b. 02/2012 Myoview: non-ischemic, EF 57%  . HTN (hypertension)   . Hyperlipidemia   . Syncope and collapse     a. near-syncopal episode in November 2008;  b. 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  . CVA (cerebral infarction)     a.  Small right parietal noted incidentally 04/2007;  b. right sided embolic CVA 05/2012;  c. 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 Peripheral neuropathy   . Obesity, unspecified   . Esophageal reflux   . Diabetes mellitus, type II   . Diaphragmatic hernia without mention of obstruction or gangrene   . Irritable bowel syndrome   . Morbid obesity   . Cellulitis     a. 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 Stroke     2013-'no residual'  . Vertigo   . Dry skin   . Constipation   . Tobacco abuse     Past Surgical History  Procedure Laterality Date  . Invasive electrophysiologic study  5/09    followed by insertion of an implantable loop recorder. S/p removal   . Colonoscopy    . Cholecystectomy    . Multiple toe surgeries    . Cardiac catheterization      LAD 30%, circumflex 50%, OM 75%, RI 60% with small branch 80%, dominant RCA 60%, EF 45-50%  . I&d extremity  12/23/2011    Procedure: IRRIGATION AND DEBRIDEMENT EXTREMITY;  Surgeon: Steven R Norris, MD;  Location: [facility];  Service: Orthopedics;  Laterality: Left;  Left Foot  . I&d extremity  12/26/2011    Procedure: IRRIGATION AND DEBRIDEMENT EXTREMITY;  Surgeon: John Hewitt, MD;  Location: [facility];  Service: Orthopedics;  Laterality: Left;  LEFT FOOT I&D WITH POSSIBLE WOUND VAC APPLICATION, POSSIBLE LEFT FIRST RAY  AMPUTATION  . Amputation  12/30/2011    Procedure: AMPUTATION RAY;  Surgeon: John Hewitt, MD;  Location: [facility];  Service: Orthopedics;  Laterality: Left;  LEFT FIRST RAY AMPUTATION  . Eye surgery      cataract  . Amputation  01/13/2012    Procedure: AMPUTATION BELOW KNEE;  Surgeon: John Hewitt, MD;  Location: [facility];  Service: Orthopedics;  Laterality: Left;  . Amputation  03/04/2012    Procedure: AMPUTATION BELOW KNEE;  Surgeon: John Hewitt, MD;  Location: [facility];  Service: Orthopedics;  Laterality: Left;  Revision of Left Below Knee Amputation  . Tee without cardioversion N/A 07/07/2012    Procedure: TRANSESOPHAGEAL ECHOCARDIOGRAM (TEE);  Surgeon: Brian S Crenshaw, MD;  Location: [facility] ENDOSCOPY;  Service: Cardiovascular;  Laterality: N/A;    Family History  Problem Relation Age of Onset  . Pneumonia Mother   . Gallbladder disease Mother     cancer  . Heart failure Mother   . Diabetes Father   . Coronary artery disease Father   . Stroke Neg Hx     Social History:  reports that she has been smoking Cigarettes.  She has a 10 pack-year smoking history. She has never used smokeless tobacco. She reports that she drinks about 2.4 ounces of   alcohol per week. She reports that she does not use illicit drugs.  Allergies:  Allergies  Allergen Reactions  . Banana Swelling and Other (See Comments)    Facial swelling  . Penicillins Itching, Swelling and Rash    Face swells and break out  . Strawberry Swelling and Other (See Comments)    Facial swelling    Medications: I have reviewed the patient's current medications.  Results for orders placed during the hospital encounter of 10/28/13 (from the past 48 hour(s))  CBG MONITORING, ED     Status: Abnormal   Collection Time    10/28/13  5:48 PM      Result Value Ref Range   Glucose-Capillary 148 (*) 70 - 99 mg/dL  CBC     Status: Abnormal   Collection Time    10/28/13  9:32 PM      Result Value Ref Range   WBC 15.8 (*) 4.0 - 10.5 K/uL   RBC  4.02  3.87 - 5.11 MIL/uL   Hemoglobin 10.5 (*) 12.0 - 15.0 g/dL   HCT 32.9 (*) 36.0 - 46.0 %   MCV 81.8  78.0 - 100.0 fL   MCH 26.1  26.0 - 34.0 pg   MCHC 31.9  30.0 - 36.0 g/dL   RDW 14.1  11.5 - 15.5 %   Platelets 310  150 - 400 K/uL  BASIC METABOLIC PANEL     Status: Abnormal   Collection Time    10/28/13  9:32 PM      Result Value Ref Range   Sodium 141  137 - 147 mEq/L   Potassium 3.6 (*) 3.7 - 5.3 mEq/L   Chloride 99  96 - 112 mEq/L   CO2 26  19 - 32 mEq/L   Glucose, Bld 175 (*) 70 - 99 mg/dL   BUN 13  6 - 23 mg/dL   Creatinine, Ser 0.72  0.50 - 1.10 mg/dL   Calcium 9.1  8.4 - 10.5 mg/dL   GFR calc non Af Amer >90  >90 mL/min   GFR calc Af Amer >90  >90 mL/min   Comment: (NOTE)     The eGFR has been calculated using the CKD EPI equation.     This calculation has not been validated in all clinical situations.     eGFR's persistently <90 mL/min signify possible Chronic Kidney     Disease.  CBG MONITORING, ED     Status: Abnormal   Collection Time    10/28/13  9:44 PM      Result Value Ref Range   Glucose-Capillary 150 (*) 70 - 99 mg/dL    Dg Foot Complete Right  10/28/2013   CLINICAL DATA:  Right foot pain and swelling.  EXAM: RIGHT FOOT COMPLETE - 3+ VIEW  COMPARISON:  Right foot radiographs performed 09/15/2013  FINDINGS: There is no evidence of fracture or dislocation. There appears to be chronic disruption of the fifth proximal interphalangeal joint. The joint spaces are preserved. There is no evidence of talar subluxation; the subtalar joint is unremarkable in appearance.  Prominent soft tissue swelling and soft tissue air is noted between the first and second toes, along the plantar surface of the forefoot.  Diffuse vascular calcifications are seen.  IMPRESSION: 1. Prominent soft tissue swelling and soft tissue air noted between the first and second toes, along the plantar surface of the forefoot. This is compatible with gangrene; necrotizing fasciitis cannot be excluded.  Would correlate clinically. 2. No evidence of acute fracture  or dislocation; no definite osseous erosions identified. 3. Diffuse vascular calcifications seen. 4. 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   Electronically Signed   By: Jeffery  Chang M.D.   On: 10/28/2013 23:08    Review of Systems  Constitutional: Positive for chills and malaise/fatigue.  Neurological: Positive for headaches.  All other systems reviewed and are negative.  Blood pressure 156/73, pulse 87, temperature 98.9 F (37.2 C), temperature source Oral, resp. rate 18, SpO2 100.00%. Physical Exam  Vitals reviewed. Constitutional: She is oriented to person, place, and time. She appears well-developed and well-nourished.  HENT:  Head: Normocephalic and atraumatic.  Eyes: Pupils are equal, round, and reactive to light.  Neck: Normal range of motion. Neck supple.  Cardiovascular: Normal rate.   Respiratory: Effort normal.  GI: Soft.  Musculoskeletal: She exhibits edema and tenderness.  Eschar plantar aspect right 1st and second toe. Swelling erythema.  Compartments soft. Nonpalpaple pulse. Pulse by doppler. Foul odor.  Neurological: She is alert and oriented to person, place, and time.  Skin: Skin is warm and dry.  Psychiatric: She has a normal mood and affect.  No crepitus  Assessment/Plan: Diabetic foot infection with necrotic ulcer gas in soft tissue Between first and second toes plantar aspect associated necrotizing fascites. Plan emergent I&D foot. OR called Medicine admit. Agree with Triple Antibiotic coverage.  Risks discussed.  BEANE,JEFFREY C 10/28/2013, 11:49 PM      

## 2013-10-29 ENCOUNTER — Emergency Department (HOSPITAL_COMMUNITY): Payer: Medicaid Other | Admitting: Anesthesiology

## 2013-10-29 ENCOUNTER — Emergency Department (HOSPITAL_COMMUNITY): Payer: Medicaid Other

## 2013-10-29 ENCOUNTER — Encounter (HOSPITAL_COMMUNITY): Payer: Medicaid Other | Admitting: Anesthesiology

## 2013-10-29 ENCOUNTER — Encounter (HOSPITAL_COMMUNITY): Payer: Self-pay | Admitting: *Deleted

## 2013-10-29 ENCOUNTER — Encounter (HOSPITAL_COMMUNITY)
Admission: EM | Disposition: A | Discharge status: Skilled Nursing Facility | Payer: Self-pay | Source: Home / Self Care | Attending: Internal Medicine

## 2013-10-29 DIAGNOSIS — Z8673 Personal history of transient ischemic attack (TIA), and cerebral infarction without residual deficits: Secondary | ICD-10-CM | POA: Diagnosis not present

## 2013-10-29 DIAGNOSIS — I96 Gangrene, not elsewhere classified: Secondary | ICD-10-CM | POA: Diagnosis present

## 2013-10-29 DIAGNOSIS — L02619 Cutaneous abscess of unspecified foot: Secondary | ICD-10-CM

## 2013-10-29 DIAGNOSIS — F17200 Nicotine dependence, unspecified, uncomplicated: Secondary | ICD-10-CM | POA: Diagnosis present

## 2013-10-29 DIAGNOSIS — E1149 Type 2 diabetes mellitus with other diabetic neurological complication: Secondary | ICD-10-CM | POA: Diagnosis present

## 2013-10-29 DIAGNOSIS — L97509 Non-pressure chronic ulcer of other part of unspecified foot with unspecified severity: Secondary | ICD-10-CM | POA: Diagnosis present

## 2013-10-29 DIAGNOSIS — L02611 Cutaneous abscess of right foot: Secondary | ICD-10-CM | POA: Diagnosis present

## 2013-10-29 DIAGNOSIS — E11621 Type 2 diabetes mellitus with foot ulcer: Secondary | ICD-10-CM | POA: Diagnosis present

## 2013-10-29 DIAGNOSIS — D72829 Elevated white blood cell count, unspecified: Secondary | ICD-10-CM

## 2013-10-29 DIAGNOSIS — E876 Hypokalemia: Secondary | ICD-10-CM | POA: Diagnosis present

## 2013-10-29 DIAGNOSIS — M726 Necrotizing fasciitis: Secondary | ICD-10-CM | POA: Diagnosis present

## 2013-10-29 DIAGNOSIS — L97519 Non-pressure chronic ulcer of other part of right foot with unspecified severity: Secondary | ICD-10-CM

## 2013-10-29 DIAGNOSIS — I10 Essential (primary) hypertension: Secondary | ICD-10-CM | POA: Diagnosis present

## 2013-10-29 DIAGNOSIS — L03119 Cellulitis of unspecified part of limb: Secondary | ICD-10-CM

## 2013-10-29 DIAGNOSIS — Z833 Family history of diabetes mellitus: Secondary | ICD-10-CM | POA: Diagnosis not present

## 2013-10-29 DIAGNOSIS — L089 Local infection of the skin and subcutaneous tissue, unspecified: Secondary | ICD-10-CM | POA: Diagnosis present

## 2013-10-29 DIAGNOSIS — A419 Sepsis, unspecified organism: Secondary | ICD-10-CM | POA: Diagnosis not present

## 2013-10-29 DIAGNOSIS — K219 Gastro-esophageal reflux disease without esophagitis: Secondary | ICD-10-CM | POA: Diagnosis present

## 2013-10-29 DIAGNOSIS — E1142 Type 2 diabetes mellitus with diabetic polyneuropathy: Secondary | ICD-10-CM

## 2013-10-29 DIAGNOSIS — K589 Irritable bowel syndrome without diarrhea: Secondary | ICD-10-CM | POA: Diagnosis present

## 2013-10-29 DIAGNOSIS — A480 Gas gangrene: Secondary | ICD-10-CM | POA: Diagnosis present

## 2013-10-29 DIAGNOSIS — E1169 Type 2 diabetes mellitus with other specified complication: Secondary | ICD-10-CM

## 2013-10-29 DIAGNOSIS — E860 Dehydration: Secondary | ICD-10-CM

## 2013-10-29 DIAGNOSIS — I739 Peripheral vascular disease, unspecified: Secondary | ICD-10-CM | POA: Diagnosis present

## 2013-10-29 DIAGNOSIS — IMO0002 Reserved for concepts with insufficient information to code with codable children: Secondary | ICD-10-CM | POA: Diagnosis present

## 2013-10-29 DIAGNOSIS — S88119A Complete traumatic amputation at level between knee and ankle, unspecified lower leg, initial encounter: Secondary | ICD-10-CM | POA: Diagnosis not present

## 2013-10-29 DIAGNOSIS — E785 Hyperlipidemia, unspecified: Secondary | ICD-10-CM | POA: Diagnosis present

## 2013-10-29 DIAGNOSIS — E11628 Type 2 diabetes mellitus with other skin complications: Secondary | ICD-10-CM | POA: Diagnosis present

## 2013-10-29 DIAGNOSIS — Z880 Allergy status to penicillin: Secondary | ICD-10-CM | POA: Diagnosis not present

## 2013-10-29 DIAGNOSIS — E1165 Type 2 diabetes mellitus with hyperglycemia: Secondary | ICD-10-CM | POA: Diagnosis not present

## 2013-10-29 HISTORY — PX: I&D EXTREMITY: SHX5045

## 2013-10-29 LAB — I-STAT CG4 LACTIC ACID, ED: Lactic Acid, Venous: 1.41 mmol/L (ref 0.5–2.2)

## 2013-10-29 LAB — GLUCOSE, CAPILLARY
GLUCOSE-CAPILLARY: 157 mg/dL — AB (ref 70–99)
Glucose-Capillary: 183 mg/dL — ABNORMAL HIGH (ref 70–99)
Glucose-Capillary: 180 mg/dL — ABNORMAL HIGH (ref 70–99)
Glucose-Capillary: 179 mg/dL — ABNORMAL HIGH (ref 70–99)

## 2013-10-29 LAB — MRSA PCR SCREENING: MRSA by PCR: NEGATIVE

## 2013-10-29 SURGERY — IRRIGATION AND DEBRIDEMENT EXTREMITY
Anesthesia: General | Site: Foot | Laterality: Right

## 2013-10-29 MED ORDER — PHENYLEPHRINE HCL 10 MG/ML IJ SOLN
INTRAMUSCULAR | Status: DC | PRN
  Administered 2013-10-29 (×4): 40 ug via INTRAVENOUS

## 2013-10-29 MED ORDER — SODIUM CHLORIDE 0.9 % IV SOLN
INTRAVENOUS | Status: DC
  Administered 2013-10-29: 125 mL/h via INTRAVENOUS
  Administered 2013-10-29 – 2013-11-01 (×3): via INTRAVENOUS

## 2013-10-29 MED ORDER — LISINOPRIL 20 MG PO TABS
20.0000 mg | ORAL_TABLET | Freq: Every day | ORAL | Status: DC
  Administered 2013-10-29 – 2013-11-04 (×7): 20 mg via ORAL
  Filled 2013-10-29 (×7): qty 1

## 2013-10-29 MED ORDER — HYDRALAZINE HCL 25 MG PO TABS
25.0000 mg | ORAL_TABLET | Freq: Four times a day (QID) | ORAL | Status: DC
  Administered 2013-10-29 – 2013-10-31 (×9): 25 mg via ORAL
  Filled 2013-10-29 (×12): qty 1

## 2013-10-29 MED ORDER — ARTIFICIAL TEARS OP OINT
TOPICAL_OINTMENT | OPHTHALMIC | Status: DC | PRN
  Administered 2013-10-29: 1 via OPHTHALMIC

## 2013-10-29 MED ORDER — ALBUTEROL SULFATE HFA 108 (90 BASE) MCG/ACT IN AERS
INHALATION_SPRAY | RESPIRATORY_TRACT | Status: DC | PRN
  Administered 2013-10-29: 2 via RESPIRATORY_TRACT

## 2013-10-29 MED ORDER — INSULIN ASPART 100 UNIT/ML ~~LOC~~ SOLN
0.0000 [IU] | Freq: Three times a day (TID) | SUBCUTANEOUS | Status: DC
  Administered 2013-10-29 – 2013-10-30 (×4): 3 [IU] via SUBCUTANEOUS
  Administered 2013-10-30 – 2013-11-01 (×4): 2 [IU] via SUBCUTANEOUS
  Administered 2013-11-01: 3 [IU] via SUBCUTANEOUS
  Administered 2013-11-03: 2 [IU] via SUBCUTANEOUS
  Administered 2013-11-03: 3 [IU] via SUBCUTANEOUS
  Administered 2013-11-04: 2 [IU] via SUBCUTANEOUS

## 2013-10-29 MED ORDER — IBUPROFEN 800 MG PO TABS
800.0000 mg | ORAL_TABLET | Freq: Three times a day (TID) | ORAL | Status: DC | PRN
Start: 2013-10-29 — End: 2013-10-29
  Administered 2013-10-29: 800 mg via ORAL
  Filled 2013-10-29: qty 1

## 2013-10-29 MED ORDER — ALBUMIN HUMAN 5 % IV SOLN
INTRAVENOUS | Status: DC | PRN
  Administered 2013-10-29: 02:00:00 via INTRAVENOUS

## 2013-10-29 MED ORDER — OXYCODONE HCL 5 MG PO TABS: 5.0000 mg | ORAL_TABLET | Freq: Once | ORAL | Status: DC | PRN

## 2013-10-29 MED ORDER — POTASSIUM CHLORIDE 10 MEQ/100ML IV SOLN
10.0000 meq | INTRAVENOUS | Status: AC
  Administered 2013-10-29 (×3): 10 meq via INTRAVENOUS
  Filled 2013-10-29 (×3): qty 100

## 2013-10-29 MED ORDER — SUCCINYLCHOLINE CHLORIDE 20 MG/ML IJ SOLN
INTRAMUSCULAR | Status: DC | PRN
  Administered 2013-10-29: 120 mg via INTRAVENOUS

## 2013-10-29 MED ORDER — INSULIN GLARGINE 100 UNIT/ML ~~LOC~~ SOLN
20.0000 [IU] | Freq: Every day | SUBCUTANEOUS | Status: DC
  Administered 2013-10-29: 20 [IU] via SUBCUTANEOUS
  Filled 2013-10-29 (×2): qty 0.2

## 2013-10-29 MED ORDER — PROPOFOL 10 MG/ML IV BOLUS
INTRAVENOUS | Status: AC
  Filled 2013-10-29: qty 20

## 2013-10-29 MED ORDER — FENTANYL CITRATE 0.05 MG/ML IJ SOLN
INTRAMUSCULAR | Status: AC
  Filled 2013-10-29: qty 5

## 2013-10-29 MED ORDER — OXYCODONE-ACETAMINOPHEN 5-325 MG PO TABS
1.0000 | ORAL_TABLET | ORAL | Status: DC | PRN
  Administered 2013-10-29 – 2013-11-02 (×7): 1 via ORAL
  Filled 2013-10-29 (×7): qty 1

## 2013-10-29 MED ORDER — EPHEDRINE SULFATE 50 MG/ML IJ SOLN
INTRAMUSCULAR | Status: DC | PRN
  Administered 2013-10-29: 5 mg via INTRAVENOUS

## 2013-10-29 MED ORDER — ENOXAPARIN SODIUM 40 MG/0.4ML ~~LOC~~ SOLN
40.0000 mg | SUBCUTANEOUS | Status: DC
  Administered 2013-10-29 – 2013-11-04 (×6): 40 mg via SUBCUTANEOUS
  Filled 2013-10-29 (×7): qty 0.4

## 2013-10-29 MED ORDER — ONDANSETRON HCL 4 MG/2ML IJ SOLN
INTRAMUSCULAR | Status: AC
  Filled 2013-10-29: qty 2

## 2013-10-29 MED ORDER — FENTANYL CITRATE 0.05 MG/ML IJ SOLN
INTRAMUSCULAR | Status: DC | PRN
  Administered 2013-10-29 (×3): 50 ug via INTRAVENOUS

## 2013-10-29 MED ORDER — VANCOMYCIN HCL IN DEXTROSE 1-5 GM/200ML-% IV SOLN
INTRAVENOUS | Status: AC
  Filled 2013-10-29: qty 200

## 2013-10-29 MED ORDER — SUCCINYLCHOLINE CHLORIDE 20 MG/ML IJ SOLN
INTRAMUSCULAR | Status: AC
  Filled 2013-10-29: qty 1

## 2013-10-29 MED ORDER — LACTATED RINGERS IV SOLN
INTRAVENOUS | Status: DC | PRN
  Administered 2013-10-29: 01:00:00 via INTRAVENOUS

## 2013-10-29 MED ORDER — PHENYLEPHRINE 40 MCG/ML (10ML) SYRINGE FOR IV PUSH (FOR BLOOD PRESSURE SUPPORT)
PREFILLED_SYRINGE | INTRAVENOUS | Status: AC
  Filled 2013-10-29: qty 10

## 2013-10-29 MED ORDER — ARTIFICIAL TEARS OP OINT
TOPICAL_OINTMENT | OPHTHALMIC | Status: AC
  Filled 2013-10-29: qty 3.5

## 2013-10-29 MED ORDER — LIDOCAINE HCL (CARDIAC) 20 MG/ML IV SOLN
INTRAVENOUS | Status: DC | PRN
  Administered 2013-10-29: 80 mg via INTRAVENOUS

## 2013-10-29 MED ORDER — OXYCODONE HCL 5 MG/5ML PO SOLN: 5.0000 mg | Freq: Once | ORAL | Status: DC | PRN

## 2013-10-29 MED ORDER — SODIUM CHLORIDE 0.9 % IR SOLN
Status: DC | PRN
  Administered 2013-10-29: 1000 mL

## 2013-10-29 MED ORDER — ATORVASTATIN CALCIUM 40 MG PO TABS
40.0000 mg | ORAL_TABLET | Freq: Every day | ORAL | Status: DC
  Administered 2013-10-29 – 2013-11-03 (×6): 40 mg via ORAL
  Filled 2013-10-29 (×7): qty 1

## 2013-10-29 MED ORDER — HYDROMORPHONE HCL PF 1 MG/ML IJ SOLN: 0.2500 mg | INTRAMUSCULAR | Status: DC | PRN

## 2013-10-29 MED ORDER — ONDANSETRON HCL 4 MG/2ML IJ SOLN: 4.0000 mg | Freq: Once | INTRAMUSCULAR | Status: DC | PRN

## 2013-10-29 MED ORDER — PROPOFOL 10 MG/ML IV BOLUS
INTRAVENOUS | Status: DC | PRN
  Administered 2013-10-29: 50 mg via INTRAVENOUS
  Administered 2013-10-29: 150 mg via INTRAVENOUS

## 2013-10-29 MED ORDER — ONDANSETRON HCL 4 MG/2ML IJ SOLN
INTRAMUSCULAR | Status: DC | PRN
  Administered 2013-10-29: 4 mg via INTRAVENOUS

## 2013-10-29 SURGICAL SUPPLY — 38 items
BANDAGE ELASTIC 4 VELCRO ST LF (GAUZE/BANDAGES/DRESSINGS) ×3 IMPLANT
BANDAGE GAUZE ELAST BULKY 4 IN (GAUZE/BANDAGES/DRESSINGS) ×3 IMPLANT
BNDG COHESIVE 4X5 TAN STRL (GAUZE/BANDAGES/DRESSINGS) ×3 IMPLANT
COVER SURGICAL LIGHT HANDLE (MISCELLANEOUS) ×3 IMPLANT
CUFF TOURNIQUET SINGLE 18IN (TOURNIQUET CUFF) IMPLANT
CUFF TOURNIQUET SINGLE 24IN (TOURNIQUET CUFF) IMPLANT
CUFF TOURNIQUET SINGLE 34IN LL (TOURNIQUET CUFF) IMPLANT
CUFF TOURNIQUET SINGLE 44IN (TOURNIQUET CUFF) IMPLANT
DRSG PAD ABDOMINAL 8X10 ST (GAUZE/BANDAGES/DRESSINGS) ×3 IMPLANT
ELECT REM PT RETURN 9FT ADLT (ELECTROSURGICAL) ×3
ELECTRODE REM PT RTRN 9FT ADLT (ELECTROSURGICAL) ×1 IMPLANT
GLOVE SURG SS PI 7.0 STRL IVOR (GLOVE) ×3 IMPLANT
GLOVE SURG SS PI 8.0 STRL IVOR (GLOVE) ×6 IMPLANT
GOWN EXTRA PROTECTION XL (GOWNS) ×3 IMPLANT
GOWN STRL REUS W/ TWL LRG LVL3 (GOWN DISPOSABLE) ×1 IMPLANT
GOWN STRL REUS W/TWL LRG LVL3 (GOWN DISPOSABLE) ×2
HANDPIECE INTERPULSE COAX TIP (DISPOSABLE)
KIT BASIN OR (CUSTOM PROCEDURE TRAY) ×3 IMPLANT
KIT ROOM TURNOVER OR (KITS) ×3 IMPLANT
MANIFOLD NEPTUNE II (INSTRUMENTS) ×3 IMPLANT
NS IRRIG 1000ML POUR BTL (IV SOLUTION) ×3 IMPLANT
PACK ORTHO EXTREMITY (CUSTOM PROCEDURE TRAY) ×3 IMPLANT
PAD ARMBOARD 7.5X6 YLW CONV (MISCELLANEOUS) ×6 IMPLANT
SET HNDPC FAN SPRY TIP SCT (DISPOSABLE) IMPLANT
SPONGE GAUZE 4X4 12PLY (GAUZE/BANDAGES/DRESSINGS) ×3 IMPLANT
SPONGE LAP 18X18 X RAY DECT (DISPOSABLE) ×3 IMPLANT
SPONGE LAP 4X18 X RAY DECT (DISPOSABLE) ×3 IMPLANT
STAPLER VISISTAT 35W (STAPLE) IMPLANT
STOCKINETTE IMPERVIOUS 9X36 MD (GAUZE/BANDAGES/DRESSINGS) IMPLANT
TOWEL OR 17X24 6PK STRL BLUE (TOWEL DISPOSABLE) IMPLANT
TOWEL OR 17X26 10 PK STRL BLUE (TOWEL DISPOSABLE) ×3 IMPLANT
TRAY FOLEY CATH 16FR SILVER (SET/KITS/TRAYS/PACK) ×3 IMPLANT
TUBE ANAEROBIC SPECIMEN COL (MISCELLANEOUS) ×3 IMPLANT
TUBE CONNECTING 12'X1/4 (SUCTIONS) ×1
TUBE CONNECTING 12X1/4 (SUCTIONS) ×2 IMPLANT
UNDERPAD 30X30 INCONTINENT (UNDERPADS AND DIAPERS) ×3 IMPLANT
WATER STERILE IRR 1000ML POUR (IV SOLUTION) ×3 IMPLANT
YANKAUER SUCT BULB TIP NO VENT (SUCTIONS) ×3 IMPLANT

## 2013-10-29 NOTE — Anesthesia Postprocedure Evaluation (Signed)
  Anesthesia Post-op Note  Patient: [name]  Procedure(s) Performed: Procedure(s): IRRIGATION AND DEBRIDEMENT EXTREMITY (Right)  Patient Location: PACU  Anesthesia Type:General  Level of Consciousness: awake and alert   Airway and Oxygen Therapy: Patient Spontanous Breathing and Patient connected to face mask oxygen  Post-op Pain: none  Post-op Assessment: Post-op Vital signs reviewed  Post-op Vital Signs: Reviewed  Last Vitals:  Filed Vitals:   10/29/13 0254  BP: 153/65  Pulse: 88  Temp: 36.6 C  Resp: 16    Complications: No apparent anesthesia complications

## 2013-10-29 NOTE — Progress Notes (Signed)
Patient due to void. No output using BSC. Bladder scan done showing 160 mL. Will continue to monitor

## 2013-10-29 NOTE — H&P (Signed)
[facility] History and Physical  [name] VQM:[id number] DOB: [birth year] DOA: 10/28/2013  Referring physician: EDP PCP: [name], MD   Chief Complaint: Foot pain   HPI: [name] is a 58 y.o. female h/o DM, who presents with 1 week history of a wound on her left foot.  For the past 12-24, there has been increased pain, redness of the wound.  She has also had headache during this time.  Nothing seems to make symptoms better.  Because of the symptoms the patient presented to the ED.  Work up in the ED demonstrated an infected, necrotic, diabetic foot ulcer with necrotic area under plantar surface under first MP joint.  WBC was 15k, and imaging studies demonstrated soft tissue gas.  Patient was taken urgently to the OR for debridement for suspicion of necrotizing fasciitis.  Review of Systems: Systems reviewed.  As above, otherwise negative  Past Medical History  Diagnosis Date  . Atypical chest pain     a. non obstructive by cath in 2008 and 2010;  b. 02/2012 Myoview: non-ischemic, EF 57%  . HTN (hypertension)   . Hyperlipidemia   . Syncope and collapse     a. near-syncopal episode in November 2008;  b. 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  . CVA (cerebral infarction)     a.  Small right parietal noted incidentally 04/2007;  b. right sided embolic CVA 01/3266;  c. 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 Peripheral neuropathy   . Obesity, unspecified   . Esophageal reflux   . Diabetes mellitus, type II   . Diaphragmatic hernia without mention of obstruction or gangrene   . Irritable bowel syndrome   . Morbid obesity   . Cellulitis     a. 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 Stroke     2013-'no residual'  . Vertigo   . Dry skin   . Constipation   . Tobacco abuse    Past Surgical History  Procedure Laterality Date  . Invasive electrophysiologic study  5/09     followed by insertion of an implantable loop recorder. S/p removal   . Colonoscopy    . Cholecystectomy    . Multiple toe surgeries    . Cardiac catheterization      LAD 30%, circumflex 50%, OM 75%, RI 60% with small branch 80%, dominant RCA 60%, EF 45-50%  . I&d extremity  12/23/2011    Procedure: IRRIGATION AND DEBRIDEMENT EXTREMITY;  Surgeon: Augustin Schooling, MD;  Location: [place];  Service: Orthopedics;  Laterality: Left;  Left Foot  . I&d extremity  12/26/2011    Procedure: IRRIGATION AND DEBRIDEMENT EXTREMITY;  Surgeon: Wylene Simmer, MD;  Location: [place];  Service: Orthopedics;  Laterality: Left;  LEFT FOOT I&D WITH POSSIBLE WOUND VAC APPLICATION, POSSIBLE LEFT FIRST RAY AMPUTATION  . Amputation  12/30/2011    Procedure: AMPUTATION RAY;  Surgeon: Wylene Simmer, MD;  Location: [place];  Service: Orthopedics;  Laterality: Left;  LEFT FIRST RAY AMPUTATION  . Eye surgery      cataract  . Amputation  01/13/2012    Procedure: AMPUTATION BELOW KNEE;  Surgeon: Wylene Simmer, MD;  Location: [place];  Service: Orthopedics;  Laterality: Left;  . Amputation  03/04/2012    Procedure: AMPUTATION BELOW KNEE;  Surgeon: Wylene Simmer, MD;  Location: [place];  Service: Orthopedics;  Laterality: Left;  Revision  of Left Below Knee Amputation  . Tee without cardioversion N/A 07/07/2012    Procedure: TRANSESOPHAGEAL ECHOCARDIOGRAM (TEE);  Surgeon: Lelon Perla, MD;  Location: [facility] ENDOSCOPY;  Service: Cardiovascular;  Laterality: N/A;   Social History:  reports that she has been smoking Cigarettes.  She has a 10 pack-year smoking history. She has never used smokeless tobacco. She reports that she drinks about 2.4 ounces of alcohol per week. She reports that she does not use illicit drugs.  Allergies  Allergen Reactions  . Banana Swelling and Other (See Comments)    Facial swelling  . Penicillins Itching, Swelling and Rash    Face swells and break out  . Strawberry Swelling and Other (See Comments)    Facial swelling     Family History  Problem Relation Age of Onset  . Pneumonia Mother   . Gallbladder disease Mother     cancer  . Heart failure Mother   . Diabetes Father   . Coronary artery disease Father   . Stroke Neg Hx      Prior to Admission medications   Medication Sig Start Date End Date Taking? Authorizing Provider  glimepiride (AMARYL) 4 MG tablet Take 4 mg by mouth daily with breakfast.   Yes Historical Provider, MD  hydrALAZINE (APRESOLINE) 25 MG tablet Take 25 mg by mouth 4 (four) times daily.   Yes Historical Provider, MD  hydrochlorothiazide (HYDRODIURIL) 25 MG tablet Take 25 mg by mouth every morning.   Yes Historical Provider, MD  ibuprofen (ADVIL,MOTRIN) 800 MG tablet Take 800 mg by mouth every 8 (eight) hours as needed for moderate pain.   Yes Historical Provider, MD  insulin glargine (LANTUS) 100 UNIT/ML injection Inject 25 Units into the skin at bedtime.   Yes Historical Provider, MD  lisinopril (PRINIVIL,ZESTRIL) 20 MG tablet Take 1 tablet (20 mg total) by mouth daily. 01/21/13  Yes Daniel J Angiulli, PA-C  simvastatin (ZOCOR) 80 MG tablet Take 1 tablet (80 mg total) by mouth every evening. 01/21/13  Yes Lavon Paganini Angiulli, PA-C   Physical Exam: Filed Vitals:   10/29/13 0245  BP: 149/64  Pulse: 90  Temp:   Resp: 15    BP 149/64  Pulse 90  Temp(Src) 97.7 F (36.5 C) (Oral)  Resp 15  SpO2 100%  General Appearance:    Alert, oriented, sleepy from anesthesia, patient is currently in PACU after debridement no distress, appears stated age  Head:    Normocephalic, atraumatic  Eyes:    PERRL, EOMI, sclera non-icteric        Nose:   Nares without drainage or epistaxis. Mucosa, turbinates normal  Throat:   Moist mucous membranes. Oropharynx without erythema or exudate.  Neck:   Supple. No carotid bruits.  No thyromegaly.  No lymphadenopathy.   Back:     No CVA tenderness, no spinal tenderness  Lungs:     Clear to auscultation bilaterally, without wheezes, rhonchi or rales   Chest wall:    No tenderness to palpitation  Heart:    Regular rate and rhythm without murmurs, gallops, rubs  Abdomen:     Soft, non-tender, nondistended, normal bowel sounds, no organomegaly  Genitalia:    deferred  Rectal:    deferred  Extremities:   Foot under surgical dressing, dressing C/D/I  Pulses:   2+ and symmetric all extremities  Skin:   Skin color, texture, turgor normal, no rashes or lesions  Lymph nodes:   Cervical, supraclavicular, and axillary nodes normal  Neurologic:  CNII-XII intact. Normal strength, sensation and reflexes      throughout    Labs on Admission:  Basic Metabolic Panel:  Recent Labs Lab 10/28/13 2132  NA 141  K 3.6*  CL 99  CO2 26  GLUCOSE 175*  BUN 13  CREATININE 0.72  CALCIUM 9.1   Liver Function Tests: No results found for this basename: AST, ALT, ALKPHOS, BILITOT, PROT, ALBUMIN,  in the last 168 hours No results found for this basename: LIPASE, AMYLASE,  in the last 168 hours No results found for this basename: AMMONIA,  in the last 168 hours CBC:  Recent Labs Lab 10/28/13 2132  WBC 15.8*  HGB 10.5*  HCT 32.9*  MCV 81.8  PLT 310   Cardiac Enzymes: No results found for this basename: CKTOTAL, CKMB, CKMBINDEX, TROPONINI,  in the last 168 hours  BNP (last 3 results) No results found for this basename: PROBNP,  in the last 8760 hours CBG:  Recent Labs Lab 10/28/13 1748 10/28/13 2144 10/29/13 0219  GLUCAP 148* 150* 183*    Radiological Exams on Admission: Dg Chest Portable 1 View  10/29/2013   CLINICAL DATA:  Preoperative chest radiograph for right foot surgery.  EXAM: PORTABLE CHEST - 1 VIEW  COMPARISON:  Chest radiograph performed 09/30/2013  FINDINGS: The lungs are well-aerated and clear. There is no evidence of focal opacification, pleural effusion or pneumothorax.  The cardiomediastinal silhouette is within normal limits. No acute osseous abnormalities are seen.  IMPRESSION: No acute cardiopulmonary process seen.    Electronically Signed   By: [name] M.D.   On: 10/29/2013 01:37   Dg Foot Complete Right  10/28/2013   CLINICAL DATA:  Right foot pain and swelling.  EXAM: RIGHT FOOT COMPLETE - 3+ VIEW  COMPARISON:  Right foot radiographs performed 09/15/2013  FINDINGS: There is no evidence of fracture or dislocation. There appears to be chronic disruption of the fifth proximal interphalangeal joint. The joint spaces are preserved. There is no evidence of talar subluxation; the subtalar joint is unremarkable in appearance.  Prominent soft tissue swelling and soft tissue air is noted between the first and second toes, along the plantar surface of the forefoot.  Diffuse vascular calcifications are seen.  IMPRESSION: 1. Prominent soft tissue swelling and soft tissue air noted between the first and second toes, along the plantar surface of the forefoot. This is compatible with gangrene; necrotizing fasciitis cannot be excluded. Would correlate clinically. 2. No evidence of acute fracture or dislocation; no definite osseous erosions identified. 3. Diffuse vascular calcifications seen. 4. 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   Electronically Signed   By: [name] M.D.   On: 10/28/2013 23:08    EKG: Independently reviewed.  Assessment/Plan Principal Problem:   Gas gangrene of foot Active Problems:   DM type 2, uncontrolled, with neuropathy   HTN (hypertension)   Diabetic ulcer of right foot   Leukocytosis, unspecified   1. 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 Reather Littler note for more details.  Currently treating with vanc and aztreonam (PCN allergic) per pharmacy as well as clindamycin.  Blood and wound cultures are pending.  Wound care per ortho. 2. Leukocytosis - no other SIRS criteria at this point, none the less due to initial concern for necrotizing faciitis will admit patient to SDU at least overnight.  If still  stable in AM consider transfer to floor. 3. DM -  on lantus 25 and glyburide at home.  Will put on lantus 20 and med dose SSI AC/HS while here.  BGL 183 at this time. 4. HTN - continue home meds but holding HCTZ and putting patient on IVF instead due to #1 above.    Code Status: Full Code  Family Communication: No family in room Disposition Plan: Admit to inpatient   Time spent: 70 min  Matti Minney M. [facility] Pager [phone]  If 7AM-7PM, please contact the day team taking care of the patient [email or web address] Password [email or web address] 10/29/2013, 2:52 AM

## 2013-10-29 NOTE — Progress Notes (Signed)
Physical Therapy Wound Evaluation and Treatment Patient Details  Name: [name] MRN: [id number] Date of Birth: 1955/08/22  Today's Date: 10/29/2013 Time: 5669-0503 Time Calculation (min): 31 min  Subjective  Subjective: I didn't feel a thing Patient and Family Stated Goals: did not state  Pain Score:    Wound Assessment  Wound / Incision (Open or Dehisced) 10/29/13 Diabetic ulcer Foot Left surgical debridement of necrotizing fascitis on plantar surface prox. to toes 1 and 2 (Active)  Dressing Type ABD;Compression wrap;Gauze (Comment) 10/29/2013  4:01 PM  Dressing Changed Changed 10/29/2013  4:01 PM  Dressing Status Clean;Dry;Intact 10/29/2013  4:01 PM  Dressing Change Frequency Daily 10/29/2013  4:01 PM  Site / Wound Assessment Pink;Pale;Yellow;Other (Comment) 10/29/2013  4:01 PM  % Wound base Red or Granulating 10% 10/29/2013  4:01 PM  % Wound base Yellow 50% 10/29/2013  4:01 PM  % Wound base Other (Comment) 40% 10/29/2013  4:01 PM  Peri-wound Assessment Intact;Erythema (non-blanchable);Other (Comment) 10/29/2013  4:01 PM  Wound Length (cm) 7 cm 10/29/2013  4:01 PM  Wound Width (cm) 4.6 cm 10/29/2013  4:01 PM  Wound Depth (cm) 1.5 cm 10/29/2013  4:01 PM  Margins Unattached edges (unapproximated) 10/29/2013  4:01 PM  Closure None 10/29/2013  4:01 PM  Drainage Amount Minimal 10/29/2013  4:01 PM  Drainage Description Serous 10/29/2013  4:01 PM  Non-staged Wound Description Full thickness 10/29/2013  4:01 PM  Treatment Cleansed;Debridement (Selective);Hydrotherapy (Pulse lavage);Packing (Saline gauze) 10/29/2013  4:01 PM     Incision (Closed) 10/29/13 Foot Right (Active)  Dressing Type Compression wrap;ABD;Gauze (Comment) 10/29/2013  3:00 PM  Dressing Clean;Dry;Intact 10/29/2013  3:00 PM  Dressing Change Frequency Daily 10/29/2013  3:00 PM  Site / Wound Assessment Pale;Pink;Yellow;Other (Comment) 10/29/2013  3:00 PM  Incision Length (cm) 7 cm 10/29/2013  3:00 PM   Hydrotherapy Pulsed lavage  therapy - wound location: Left foot surgical wound Pulsed Lavage with Suction (psi): 8 psi Pulsed Lavage with Suction - Normal Saline Used: 1000 mL Pulsed Lavage Tip: Tip with splash shield Selective Debridement Selective Debridement - Location: plant surface wound L foot Selective Debridement - Tools Used: Forceps;Scissors Selective Debridement - Tissue Removed: necrotic adipose and tissue, slough   Wound Assessment and Plan  Wound Therapy - Assess/Plan/Recommendations Wound Therapy - Clinical Statement: Hope to cleanse and decrease the bacterial load on this surgical wound.  Will debride selectively to viable tissues.  A good outcome is suspect at this point.given the condition of the wornd Wound Therapy - Functional Problem List: This wound will limit pts weightbearing and rehab Factors Delaying/Impairing Wound Healing: Diabetes Mellitus;Infection - systemic/local;Vascular compromise Hydrotherapy Plan: Debridement;Dressing change;Patient/family education;Pulsatile lavage with suction Wound Therapy - Frequency: 6X / week Wound Therapy - Follow Up Recommendations: Skilled nursing facility Wound Plan: Continue PLS, debridement and dressing changes to assist improvement in the character of the wound.  Wound Therapy Goals- Improve the function of patient's integumentary system by progressing the wound(s) through the phases of wound healing (inflammation - proliferation - remodeling) by: Decrease Necrotic Tissue to: 30 Decrease Necrotic Tissue - Progress: Goal set today Increase Granulation Tissue to: 70 (including any viable structures incl tendon, capsule) Increase Granulation Tissue - Progress: Goal set today Improve Drainage Characteristics: Serous;Min Improve Drainage Characteristics - Progress: Goal set today Time For Goal Achievement: 2 weeks Wound Therapy - Potential for Goals: Fair  Goals will be updated until maximal potential achieved or discharge criteria met.  Discharge  criteria: when goals achieved, discharge from hospital, MD decision/surgical  intervention, no progress towards goals, refusal/missing three consecutive treatments without notification or medical reason.  GP     Ladrea Holladay, Tessie Fass 10/29/2013, 4:13 PM  10/29/2013  Donnella Sham, [place] [phone]  (pager)

## 2013-10-29 NOTE — ED Provider Notes (Signed)
CSN: [id number]     Arrival date & time 10/28/13  1743 History   First MD Initiated Contact with Patient 10/28/13 2115     Chief Complaint  Patient presents with  . Foot Pain  . Headache     (Consider location/radiation/quality/duration/timing/severity/associated sxs/prior Treatment) HPI Comments: Pt states she noticed a wound on her L foot and redness to L foot which has been getting worse today.   Patient is a 58 y.o. female presenting with lower extremity pain and headaches. The history is provided by the patient. No language interpreter was used.  Foot Pain This is a new problem. The current episode started 12 to 24 hours ago. The problem occurs constantly. The problem has been gradually worsening. Associated symptoms include headaches. Pertinent negatives include no chest pain, no abdominal pain and no shortness of breath. Exacerbated by: palpation. The symptoms are relieved by rest. She has tried nothing for the symptoms. The treatment provided no relief.  Headache Pain location:  Frontal Radiates to:  Does not radiate Pain severity now: mild to moderate. Onset quality:  Unable to specify Duration:  1 week Timing:  Intermittent Progression:  Waxing and waning Chronicity:  New Relieved by:  Nothing Exacerbated by: movement. Associated symptoms: no abdominal pain, no back pain, no congestion, no cough, no diarrhea, no fatigue, no fever, no focal weakness, no nausea, no neck pain, no neck stiffness, no numbness, no photophobia, no sore throat, no vomiting and no weakness     Past Medical History  Diagnosis Date  . Atypical chest pain     a. non obstructive by cath in 2008 and 2010;  b. 02/2012 Myoview: non-ischemic, EF 57%  . HTN (hypertension)   . Hyperlipidemia   . Syncope and collapse     a. near-syncopal episode in November 2008;  b. 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  . CVA (cerebral infarction)     a.  Small right parietal noted incidentally 04/2007;  b. right  sided embolic CVA 01/6294;  c. 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 Peripheral neuropathy   . Obesity, unspecified   . Esophageal reflux   . Diabetes mellitus, type II   . Diaphragmatic hernia without mention of obstruction or gangrene   . Irritable bowel syndrome   . Morbid obesity   . Cellulitis     a. 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 Stroke     2013-'no residual'  . Vertigo   . Dry skin   . Constipation   . Tobacco abuse    Past Surgical History  Procedure Laterality Date  . Invasive electrophysiologic study  5/09    followed by insertion of an implantable loop recorder. S/p removal   . Colonoscopy    . Cholecystectomy    . Multiple toe surgeries    . Cardiac catheterization      LAD 30%, circumflex 50%, OM 75%, RI 60% with small branch 80%, dominant RCA 60%, EF 45-50%  . I&d extremity  12/23/2011    Procedure: IRRIGATION AND DEBRIDEMENT EXTREMITY;  Surgeon: Augustin Schooling, MD;  Location: [place];  Service: Orthopedics;  Laterality: Left;  Left Foot  . I&d extremity  12/26/2011    Procedure: IRRIGATION AND DEBRIDEMENT EXTREMITY;  Surgeon: Wylene Simmer, MD;  Location: [place];  Service: Orthopedics;  Laterality: Left;  LEFT FOOT I&D WITH POSSIBLE WOUND VAC APPLICATION, POSSIBLE  LEFT FIRST RAY AMPUTATION  . Amputation  12/30/2011    Procedure: AMPUTATION RAY;  Surgeon: Wylene Simmer, MD;  Location: [place];  Service: Orthopedics;  Laterality: Left;  LEFT FIRST RAY AMPUTATION  . Eye surgery      cataract  . Amputation  01/13/2012    Procedure: AMPUTATION BELOW KNEE;  Surgeon: Wylene Simmer, MD;  Location: [place];  Service: Orthopedics;  Laterality: Left;  . Amputation  03/04/2012    Procedure: AMPUTATION BELOW KNEE;  Surgeon: Wylene Simmer, MD;  Location: [place];  Service: Orthopedics;  Laterality: Left;  Revision of Left Below Knee Amputation  . Tee without cardioversion N/A 07/07/2012     Procedure: TRANSESOPHAGEAL ECHOCARDIOGRAM (TEE);  Surgeon: Lelon Perla, MD;  Location: [facility] ENDOSCOPY;  Service: Cardiovascular;  Laterality: N/A;   Family History  Problem Relation Age of Onset  . Pneumonia Mother   . Gallbladder disease Mother     cancer  . Heart failure Mother   . Diabetes Father   . Coronary artery disease Father   . Stroke Neg Hx    History  Substance Use Topics  . Smoking status: Current Every Day Smoker -- 0.25 packs/day for 40 years    Types: Cigarettes  . Smokeless tobacco: Never Used  . 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   OB History   Grav Para Term Preterm Abortions TAB SAB Ect Mult Living                 Review of Systems  Constitutional: Negative for fever, chills, diaphoresis, activity change, appetite change and fatigue.  HENT: Negative for congestion, facial swelling, rhinorrhea and sore throat.   Eyes: Negative for photophobia and discharge.  Respiratory: Negative for cough, chest tightness and shortness of breath.   Cardiovascular: Negative for chest pain, palpitations and leg swelling.  Gastrointestinal: Negative for nausea, vomiting, abdominal pain and diarrhea.  Endocrine: Negative for polydipsia and polyuria.  Genitourinary: Negative for dysuria, frequency, difficulty urinating and pelvic pain.  Musculoskeletal: Negative for arthralgias, back pain, neck pain and neck stiffness.  Skin: Positive for wound. Negative for color change.  Allergic/Immunologic: Negative for immunocompromised state.  Neurological: Positive for headaches. Negative for focal weakness, facial asymmetry, weakness and numbness.  Hematological: Does not bruise/bleed easily.  Psychiatric/Behavioral: Negative for confusion and agitation.      Allergies  Banana; Penicillins; and Strawberry  Home Medications   Prior to Admission medications   Medication Sig Start Date  End Date Taking? Authorizing Provider  glimepiride (AMARYL) 4 MG tablet Take 4 mg by mouth daily with breakfast.   Yes Historical Provider, MD  hydrALAZINE (APRESOLINE) 25 MG tablet Take 25 mg by mouth 4 (four) times daily.   Yes Historical Provider, MD  hydrochlorothiazide (HYDRODIURIL) 25 MG tablet Take 25 mg by mouth every morning.   Yes Historical Provider, MD  ibuprofen (ADVIL,MOTRIN) 800 MG tablet Take 800 mg by mouth every 8 (eight) hours as needed for moderate pain.   Yes Historical Provider, MD  insulin glargine (LANTUS) 100 UNIT/ML injection Inject 25 Units into the skin at bedtime.   Yes Historical Provider, MD  lisinopril (PRINIVIL,ZESTRIL) 20 MG tablet Take 1 tablet (20 mg total) by mouth daily. 01/21/13  Yes Daniel J Angiulli, PA-C  simvastatin (ZOCOR) 80 MG tablet Take 1 tablet (80 mg total) by mouth every evening. 01/21/13  Yes  Daniel J Angiulli, PA-C   BP 131/63  Pulse 90  Temp(Src) 98.9 F (37.2 C) (Oral)  Resp 18  SpO2 100% Physical Exam  Constitutional: She is oriented to person, place, and time. She appears well-developed and well-nourished. No distress.  HENT:  Head: Normocephalic and atraumatic.  Mouth/Throat: No oropharyngeal exudate.  Eyes: Pupils are equal, round, and reactive to light.  Neck: Normal range of motion. Neck supple.  Cardiovascular: Normal rate, regular rhythm and normal heart sounds.  Exam reveals no gallop and no friction rub.   No murmur heard. Pulmonary/Chest: Effort normal and breath sounds normal. No respiratory distress. She has no wheezes. She has no rales.  Abdominal: Soft. Bowel sounds are normal. She exhibits no distension and no mass. There is no tenderness. There is no rebound and no guarding.  Musculoskeletal: Normal range of motion. She exhibits no edema.       Right foot: She exhibits tenderness and swelling.       Feet:  L BKA  approx 3x3cm necrotic area to plantar surface of L foot under first MP joint. +ttp over foot, not out of  proportion. No crepitus  Neurological: She is alert and oriented to person, place, and time. She has normal strength. No cranial nerve deficit or sensory deficit. GCS eye subscore is 4. GCS verbal subscore is 5. GCS motor subscore is 6.  Skin: Skin is warm and dry.  Psychiatric: She has a normal mood and affect.    ED Course  Procedures (including critical care time) Labs Review Labs Reviewed  CBC - Abnormal; Notable for the following:    WBC 15.8 (*)    Hemoglobin 10.5 (*)    HCT 32.9 (*)    All other components within normal limits  BASIC METABOLIC PANEL - Abnormal; Notable for the following:    Potassium 3.6 (*)    Glucose, Bld 175 (*)    All other components within normal limits  CBG MONITORING, ED - Abnormal; Notable for the following:    Glucose-Capillary 148 (*)    All other components within normal limits  CBG MONITORING, ED - Abnormal; Notable for the following:    Glucose-Capillary 150 (*)    All other components within normal limits  CULTURE, BLOOD (ROUTINE X 2)  CULTURE, BLOOD (ROUTINE X 2)  URINALYSIS, ROUTINE W REFLEX MICROSCOPIC  I-STAT CG4 LACTIC ACID, ED    Imaging Review Dg Foot Complete Right  10/28/2013   CLINICAL DATA:  Right foot pain and swelling.  EXAM: RIGHT FOOT COMPLETE - 3+ VIEW  COMPARISON:  Right foot radiographs performed 09/15/2013  FINDINGS: There is no evidence of fracture or dislocation. There appears to be chronic disruption of the fifth proximal interphalangeal joint. The joint spaces are preserved. There is no evidence of talar subluxation; the subtalar joint is unremarkable in appearance.  Prominent soft tissue swelling and soft tissue air is noted between the first and second toes, along the plantar surface of the forefoot.  Diffuse vascular calcifications are seen.  IMPRESSION: 1. Prominent soft tissue swelling and soft tissue air noted between the first and second toes, along the plantar surface of the forefoot. This is compatible with  gangrene; necrotizing fasciitis cannot be excluded. Would correlate clinically. 2. No evidence of acute fracture or dislocation; no definite osseous erosions identified. 3. Diffuse vascular calcifications seen. 4. 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   Electronically Signed   By: [name] M.D.   On: 10/28/2013 23:08  EKG Interpretation   Date/Time:  Saturday October 29 2013 00:25:39 EDT Ventricular Rate:  89 PR Interval:  150 QRS Duration: 92 QT Interval:  362 QTC Calculation: 440 R Axis:   56 Text Interpretation:  Sinus rhythm Borderline repolarization abnormality  Baseline wander in lead(s) V1 V3 No significant change since last tracing  Confirmed by [place] ([id number]) on 10/29/2013 12:30:30 AM      MDM   Final diagnoses:  Gangrene of foot  Cellulitis of right foot    Pt is a 58 y.o. female with Pmhx as above who presents with pain of L foot for 1 day what what she reports is new foot wound as well as 1 week fo intermittent h/a. On PE she has necrotic 3x3cm necrotic wound on plantar surface of foot. Dorsal surface of foot has soft tissue swelling, erythema and streaking c/w cellulitis. Wound is foul smelling. +ttp over L foot, not out of proportion. DP pulse not palpable, but is found w/ doppler. WBC elevated at 15.8. XR foot shows soft tissue swelling & air noted between first & second toes along plantar surface of forefoot c/w gangrene though nec fascitis cannot be excluded. IV vanc, clinda & acetreonam ordered (pen allergic). I spoke /w Dr. [name] w/ [facility] who would like her cleared to [place], admitted to medical service. She will go to the OR for I&D.   [facility] consulted, would like EKG, CXR, UA to be added.  I feel h/a is benign. No focal neuro findings, no meningitis, no visual symptoms. Doubt meningitis, SAH/ICH.    12:53 AM Spoken To Dr. [name] again, anesthesia will see her, and he would like her to go to OR.        Neta Ehlers, MD 10/29/13 [phone]

## 2013-10-29 NOTE — Brief Op Note (Signed)
10/28/2013 - 10/29/2013  2:04 AM  PATIENT:  [name]  58 y.o. female  PRE-OPERATIVE DIAGNOSIS:  necrotizing fascitis right foot  POST-OPERATIVE DIAGNOSIS:  * No post-op diagnosis entered *  PROCEDURE:  Procedure(s): IRRIGATION AND DEBRIDEMENT EXTREMITY (Right)  SURGEON:  Surgeon(s) and Role:    * Johnn Hai, MD - Primary  PHYSICIAN ASSISTANT:   ASSISTANTS: none   ANESTHESIA:   general  EBL:     BLOOD ADMINISTERED:none  DRAINS: none   LOCAL MEDICATIONS USED:  NONE  SPECIMEN:  Source of Specimen:  wound  DISPOSITION OF SPECIMEN:  PATHOLOGY  COUNTS:  YES  TOURNIQUET:  * No tourniquets in log *  DICTATION: .Other Dictation: Dictation Number [id number]  PLAN OF CARE: Admit to inpatient   PATIENT DISPOSITION:  PACU - hemodynamically stable.   Delay start of Pharmacological VTE agent (>24hrs) due to surgical blood loss or risk of bleeding: no

## 2013-10-29 NOTE — H&P (Signed)
[name] is an 58 y.o. female.   Chief Complaint: Right foot pain swelling HPI: Reports pain swelling not feeling well today. Seen in ER with infection  Past Medical History  Diagnosis Date  . Atypical chest pain     a. non obstructive by cath in 2008 and 2010;  b. 02/2012 Myoview: non-ischemic, EF 57%  . HTN (hypertension)   . Hyperlipidemia   . Syncope and collapse     a. near-syncopal episode in November 2008;  b. 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  . CVA (cerebral infarction)     a.  Small right parietal noted incidentally 04/2007;  b. right sided embolic CVA 05/6107;  c. 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 Peripheral neuropathy   . Obesity, unspecified   . Esophageal reflux   . Diabetes mellitus, type II   . Diaphragmatic hernia without mention of obstruction or gangrene   . Irritable bowel syndrome   . Morbid obesity   . Cellulitis     a. 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 Stroke     2013-'no residual'  . Vertigo   . Dry skin   . Constipation   . Tobacco abuse     Past Surgical History  Procedure Laterality Date  . Invasive electrophysiologic study  5/09    followed by insertion of an implantable loop recorder. S/p removal   . Colonoscopy    . Cholecystectomy    . Multiple toe surgeries    . Cardiac catheterization      LAD 30%, circumflex 50%, OM 75%, RI 60% with small branch 80%, dominant RCA 60%, EF 45-50%  . I&d extremity  12/23/2011    Procedure: IRRIGATION AND DEBRIDEMENT EXTREMITY;  Surgeon: Augustin Schooling, MD;  Location: [place];  Service: Orthopedics;  Laterality: Left;  Left Foot  . I&d extremity  12/26/2011    Procedure: IRRIGATION AND DEBRIDEMENT EXTREMITY;  Surgeon: Wylene Simmer, MD;  Location: [place];  Service: Orthopedics;  Laterality: Left;  LEFT FOOT I&D WITH POSSIBLE WOUND VAC APPLICATION, POSSIBLE LEFT FIRST RAY AMPUTATION  . Amputation   12/30/2011    Procedure: AMPUTATION RAY;  Surgeon: Wylene Simmer, MD;  Location: [place];  Service: Orthopedics;  Laterality: Left;  LEFT FIRST RAY AMPUTATION  . Eye surgery      cataract  . Amputation  01/13/2012    Procedure: AMPUTATION BELOW KNEE;  Surgeon: Wylene Simmer, MD;  Location: [place];  Service: Orthopedics;  Laterality: Left;  . Amputation  03/04/2012    Procedure: AMPUTATION BELOW KNEE;  Surgeon: Wylene Simmer, MD;  Location: [place];  Service: Orthopedics;  Laterality: Left;  Revision of Left Below Knee Amputation  . Tee without cardioversion N/A 07/07/2012    Procedure: TRANSESOPHAGEAL ECHOCARDIOGRAM (TEE);  Surgeon: Lelon Perla, MD;  Location: [facility] ENDOSCOPY;  Service: Cardiovascular;  Laterality: N/A;    Family History  Problem Relation Age of Onset  . Pneumonia Mother   . Gallbladder disease Mother     cancer  . Heart failure Mother   . Diabetes Father   . Coronary artery disease Father   . Stroke Neg Hx    Social History:  reports that she has been smoking Cigarettes.  She has a 10 pack-year smoking history. She has never used smokeless tobacco. She reports that she drinks about 2.4 ounces of alcohol per week. She  reports that she does not use illicit drugs.  Allergies:  Allergies  Allergen Reactions  . Banana Swelling and Other (See Comments)    Facial swelling  . Penicillins Itching, Swelling and Rash    Face swells and break out  . Strawberry Swelling and Other (See Comments)    Facial swelling     (Not in a hospital admission)  Results for orders placed during the hospital encounter of 10/28/13 (from the past 48 hour(s))  CBG MONITORING, ED     Status: Abnormal   Collection Time    10/28/13  5:48 PM      Result Value Ref Range   Glucose-Capillary 148 (*) 70 - 99 mg/dL  CBC     Status: Abnormal   Collection Time    10/28/13  9:32 PM      Result Value Ref Range   WBC 15.8 (*) 4.0 - 10.5 K/uL   RBC 4.02  3.87 - 5.11 MIL/uL   Hemoglobin 10.5 (*) 12.0 - 15.0  g/dL   HCT 32.9 (*) 36.0 - 46.0 %   MCV 81.8  78.0 - 100.0 fL   MCH 26.1  26.0 - 34.0 pg   MCHC 31.9  30.0 - 36.0 g/dL   RDW 14.1  11.5 - 15.5 %   Platelets 310  150 - 400 K/uL  BASIC METABOLIC PANEL     Status: Abnormal   Collection Time    10/28/13  9:32 PM      Result Value Ref Range   Sodium 141  137 - 147 mEq/L   Potassium 3.6 (*) 3.7 - 5.3 mEq/L   Chloride 99  96 - 112 mEq/L   CO2 26  19 - 32 mEq/L   Glucose, Bld 175 (*) 70 - 99 mg/dL   BUN 13  6 - 23 mg/dL   Creatinine, Ser 0.72  0.50 - 1.10 mg/dL   Calcium 9.1  8.4 - 10.5 mg/dL   GFR calc non Af Amer >90  >90 mL/min   GFR calc Af Amer >90  >90 mL/min   Comment: (NOTE)     The eGFR has been calculated using the CKD EPI equation.     This calculation has not been validated in all clinical situations.     eGFR's persistently <90 mL/min signify possible Chronic Kidney     Disease.  CBG MONITORING, ED     Status: Abnormal   Collection Time    10/28/13  9:44 PM      Result Value Ref Range   Glucose-Capillary 150 (*) 70 - 99 mg/dL  I-STAT CG4 LACTIC ACID, ED     Status: None   Collection Time    10/29/13 12:33 AM      Result Value Ref Range   Lactic Acid, Venous 1.41  0.5 - 2.2 mmol/L   Dg Foot Complete Right  10/28/2013   CLINICAL DATA:  Right foot pain and swelling.  EXAM: RIGHT FOOT COMPLETE - 3+ VIEW  COMPARISON:  Right foot radiographs performed 09/15/2013  FINDINGS: There is no evidence of fracture or dislocation. There appears to be chronic disruption of the fifth proximal interphalangeal joint. The joint spaces are preserved. There is no evidence of talar subluxation; the subtalar joint is unremarkable in appearance.  Prominent soft tissue swelling and soft tissue air is noted between the first and second toes, along the plantar surface of the forefoot.  Diffuse vascular calcifications are seen.  IMPRESSION: 1. Prominent soft tissue swelling and soft tissue  air noted between the first and second toes, along the plantar  surface of the forefoot. This is compatible with gangrene; necrotizing fasciitis cannot be excluded. Would correlate clinically. 2. No evidence of acute fracture or dislocation; no definite osseous erosions identified. 3. Diffuse vascular calcifications seen. 4. 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   Electronically Signed   By: [name] M.D.   On: 10/28/2013 23:08    Review of Systems  Constitutional: Positive for chills and malaise/fatigue.  Musculoskeletal: Positive for joint pain.  Neurological: Positive for weakness.  All other systems reviewed and are negative.   Blood pressure 131/63, pulse 90, temperature 98.9 F (37.2 C), temperature source Oral, resp. rate 18, SpO2 100.00%. Physical Exam  Constitutional: She is oriented to person, place, and time.  Eyes: Pupils are equal, round, and reactive to light.  Neck: Normal range of motion.  Cardiovascular: Normal rate.   Respiratory: Effort normal.  GI: Soft.  Musculoskeletal:  Right foot plantar ulcer with necrotic tissue Erythema to mid shin. No crepitus. Doppler pulse. Nonpalpable. No DVT. Compartments soft.   Neurological: She is alert and oriented to person, place, and time.  Skin: Skin is warm. She is diaphoretic.  Psychiatric: She has a normal mood and affect.     Assessment/Plan Right diabetic foot infection with ulcer  Gas in soft tissues infection. Plan I&D emergent. Risks discussed. Medicine to admit. Agree with antibiotics.  Duc Crocket C 10/29/2013, 1:04 AM

## 2013-10-29 NOTE — Progress Notes (Signed)
IV in patients left wrist was found by RN removed from site. Patient does not recall how the IV came out.

## 2013-10-29 NOTE — ED Notes (Signed)
This RN unable to gain IV access. IV team to see pt.

## 2013-10-29 NOTE — Transfer of Care (Signed)
Immediate Anesthesia Transfer of Care Note  Patient: [name]  Procedure(s) Performed: Procedure(s): IRRIGATION AND DEBRIDEMENT EXTREMITY (Right)  Patient Location: PACU  Anesthesia Type:General  Level of Consciousness: awake  Airway & Oxygen Therapy: Patient Spontanous Breathing and Patient connected to nasal cannula oxygen  Post-op Assessment: Report given to PACU RN, Post -op Vital signs reviewed and stable and Patient moving all extremities  Post vital signs: Reviewed and stable  Complications: No apparent anesthesia complications

## 2013-10-29 NOTE — Anesthesia Procedure Notes (Signed)
Procedure Name: Intubation Date/Time: 10/29/2013 1:13 AM Performed by: Vaughan Browner Pre-anesthesia Checklist: Patient identified, Emergency Drugs available, Suction available and Patient being monitored Patient Re-evaluated:Patient Re-evaluated prior to inductionOxygen Delivery Method: Circle system utilized Preoxygenation: Pre-oxygenation with 100% oxygen Intubation Type: IV induction Laryngoscope Size: Mac and 3 Grade View: Grade I Tube type: Oral Tube size: 7.5 mm Number of attempts: 1 Airway Equipment and Method: Stylet Placement Confirmation: ETT inserted through vocal cords under direct vision,  positive ETCO2 and breath sounds checked- equal and bilateral Secured at: 23 cm Tube secured with: Tape Dental Injury: Teeth and Oropharynx as per pre-operative assessment

## 2013-10-29 NOTE — Progress Notes (Addendum)
   Subjective: Day of Surgery Procedure(s) (LRB): IRRIGATION AND DEBRIDEMENT EXTREMITY (Right) Patient reports pain as moderate.   Patient seen in rounds with Dr. Wynelle Link. Patient is well, but has had some minor complaints of pain in the right foot. She denies SOB and chest pain. No issues overnight.   Objective: Vital signs in last 24 hours: Temp:  [97.7 F (36.5 C)-98.9 F (37.2 C)] 98 F (36.7 C) (06/13 0700) Pulse Rate:  [78-98] 84 (06/13 0700) Resp:  [11-20] 17 (06/13 0700) BP: (111-161)/(55-86) 146/65 mmHg (06/13 0700) SpO2:  [98 %-100 %] 100 % (06/13 0700) Weight:  [79.1 kg (174 lb 6.1 oz)] 79.1 kg (174 lb 6.1 oz) (06/13 0305)  Intake/Output from previous day:  Intake/Output Summary (Last 24 hours) at 10/29/13 0817 Last data filed at 10/29/13 0555  Gross per 24 hour  Intake   1350 ml  Output    475 ml  Net    875 ml     Labs:  Recent Labs  10/28/13 2132  HGB 10.5*    Recent Labs  10/28/13 2132  WBC 15.8*  RBC 4.02  HCT 32.9*  PLT 310    Recent Labs  10/28/13 2132  NA 141  K 3.6*  CL 99  CO2 26  BUN 13  CREATININE 0.72  GLUCOSE 175*  CALCIUM 9.1    EXAM General - Patient is Alert and Oriented Extremity - Neurologically intact Compartment soft Dressing/Incision - clean, dry, no drainage   Past Medical History  Diagnosis Date  . Atypical chest pain     a. non obstructive by cath in 2008 and 2010;  b. 02/2012 Myoview: non-ischemic, EF 57%  . HTN (hypertension)   . Hyperlipidemia   . Syncope and collapse     a. near-syncopal episode in November 2008;  b. 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  . CVA (cerebral infarction)     a.  Small right parietal noted incidentally 04/2007;  b. right sided embolic CVA 05/6107;  c. 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 Peripheral neuropathy   . Obesity, unspecified   . Esophageal reflux   .  Diabetes mellitus, type II   . Diaphragmatic hernia without mention of obstruction or gangrene   . Irritable bowel syndrome   . Morbid obesity   . Cellulitis     a. 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 Stroke     2013-'no residual'  . Vertigo   . Dry skin   . Constipation   . Tobacco abuse     Assessment/Plan: Day of Surgery Procedure(s) (LRB): IRRIGATION AND DEBRIDEMENT EXTREMITY (Right) Principal Problem:   Gas gangrene of foot Active Problems:   DM type 2, uncontrolled, with neuropathy   HTN (hypertension)   Diabetic ulcer of right foot   Leukocytosis, unspecified  Estimated body mass index is 28.16 kg/(m^2) as calculated from the following:   Height as of this encounter: 5\' 6"  (1.676 m).   Weight as of this encounter: 79.1 kg (174 lb 6.1 oz). Advance diet Continue ABX therapy   Will continue to monitor labs. Patient to start hydrotherapy this afternoon  Ardeen Jourdain, PA-C Orthopaedic Surgery 10/29/2013, 8:17 AM

## 2013-10-29 NOTE — ED Notes (Signed)
Tawnya Crook, MD told this RN that the pt is to be taken to OR, and does not need to be medically cleared.

## 2013-10-29 NOTE — Anesthesia Preprocedure Evaluation (Signed)
Anesthesia Evaluation  Patient identified by MRN, date of birth, ID band Patient awake    Reviewed: Allergy & Precautions, H&P , NPO status , Patient's Chart, lab work & pertinent test results  Airway Mallampati: I TM Distance: >3 FB Neck ROM: Full    Dental  (+) Teeth Intact, Dental Advisory Given   Pulmonary Current Smoker,  breath sounds clear to auscultation        Cardiovascular hypertension, Pt. on medications + CAD Rhythm:Regular Rate:Normal     Neuro/Psych    GI/Hepatic GERD-  Medicated and Controlled,  Endo/Other  diabetes, Well Controlled, Type 2, Insulin DependentMorbid obesity  Renal/GU      Musculoskeletal   Abdominal   Peds  Hematology   Anesthesia Other Findings   Reproductive/Obstetrics                           Anesthesia Physical Anesthesia Plan  ASA: III and emergent  Anesthesia Plan: General   Post-op Pain Management:    Induction: Intravenous  Airway Management Planned: Oral ETT  Additional Equipment:   Intra-op Plan:   Post-operative Plan: Extubation in OR  Informed Consent: I have reviewed the patients History and Physical, chart, labs and discussed the procedure including the risks, benefits and alternatives for the proposed anesthesia with the patient or authorized representative who has indicated his/her understanding and acceptance.   Dental advisory given  Plan Discussed with: Anesthesiologist, CRNA and Surgeon  Anesthesia Plan Comments:         Anesthesia Quick Evaluation

## 2013-10-29 NOTE — Op Note (Signed)
NAME:  [name], [name] NO.:  [id number]  MEDICAL RECORD NO.:  [id number]  LOCATION:  [facility]                         FACILITY:  [facility]  PHYSICIAN:  Susa Day, M.D.    DATE OF BIRTH:  10-Nov-1955  DATE OF PROCEDURE:  10/29/2013 DATE OF DISCHARGE:                              OPERATIVE REPORT   PREOPERATIVE DIAGNOSIS:  Necrotizing fasciitis of the right foot, diabetic foot ulcer, necrotic ulcer of the right first and second toes.  POSTOPERATIVE DIAGNOSIS:  Necrotic diabetic ulcer of the right foot with associated necrotic tissue.  No evidence of necrotizing fasciitis.  PROCEDURES PERFORMED: 1. Excisional debridement of diabetic ulcer measuring 3 x 4 cm on the     plantar aspect of the first and second toes down to joint capsule     of the first MTP joint. 2. Irrigation of the wound. 3. Open packing.  ANESTHESIA:  General.  BRIEF HISTORY:  This is a 58 year old female who developed pain, swelling, plantar ulcer of the foot, and some cellulitis in the foot. She had an x-ray which indicated gas in the soft tissues between the first and second metatarsals.  I could not rule out necrotizing fasciitis given her diabetes.  She had an elevated white count of 15.8. She was afebrile and hemodynamically stable.  She was indicated for debridement of necrotic foot, evaluation, and culture.  Risks and benefits were discussed including bleeding, infection, DVT, PE, anesthetic complications, need for revision including amputation.  TECHNIQUE:  With the patient in a supine position, after induction of adequate general anesthesia, and the patient received triple antibiotics; Cleocin, vancomycin, and aztreonam.  The right lower extremity was prepped and draped in the usual sterile fashion. Examination under anesthesia revealed her compartments were soft. Attention turned towards the plantar aspect of the foot.  An area of about 5 cm x 4 cm on the plantar aspect extending from  the webspace of the first and second digits to just proximal to the MTP joint just to the medial side, skin was debrided.  Just beneath the tissue, the fat pad over the MTP joint was necrotic.  I excised this with a 10 blade down to the capsule.  I removed callus as well from the plantar aspect of the foot.  The periphery of the callus and necrotic tissue, however, was bleeding tissue.  I extended down and removed the fat pad over the MTP joint down to the capsule and the flexor hallucis tendon.  Now, the necrotic tissue extended into the webspace.  The ulcer was a direct extension down to the webspace, and therefore a tract for air to extend from the ulcer down into the first webspace in the dorsum which was seen on x-ray.  I debrided necrotic tissue, then into the webspace.  In the webspace to the dorsum of the foot, there was a small blister.  I made an incision over the dorsum in the small blister area here and debrided the tissue.  From the proximal lateral part of the MTP joint, I opened the deep space of the foot.  There was no necrosis extending into the deep space or sepsis.  There was no gross purulence actually  coming from the wound itself.  I did culture it, anaerobic and aerobic cultures. Then on the dorsum, I opened up the tendon sheath of the flexor hallucis.  There was no evidence of septic tenosynovitis.  Small incision dorsally, I explored proximally, did not appear to extend proximally.  There was no soft-tissue swelling of the foot proximally, there was no crepitus.  Good refill noted in the skin there.  We copiously irrigated the wound.  We had no exposed bone.  Some fat that was left over the MTP joint.  I then tacked the wound open with a 4 x 4 with 4 x 4 that extended to the between the first and second digits and pulled through the dorsal incision.  This was loosely packed as I opened.  I then placed some 4x4's, ABD, and an Ace bandage.  No tourniquet was  utilized, made a small incision at the tip of the toe, was bleeding tissue.  We felt the periphery of the tissue, was at least at this time viable.  It was secured with an Ace bandage.  She was then awoken without difficulty and transported to the recovery room in satisfactory condition.  The patient tolerated the procedure well.  No complications.  No assistant.  Minimal blood loss.     Susa Day, M.D.     Geralynn Rile  D:  10/29/2013  T:  10/29/2013  Job:  [id number]

## 2013-10-29 NOTE — Progress Notes (Signed)
PROGRESS NOTE  [name]:[id number] DOB: 11/05/1955 DOA: 10/28/2013 PCP: [name], MD  Brief history 58 year old female with a history of hypertension, diabetes mellitus, stroke, hyperlipidemia presents with two-day history of increasing right foot pain, edema, erythema. She denies any recent injuries or trauma. In emergency department, x-ray of the foot revealed soft tissue gas. There was concern for dressing fasciitis. The patient was emergently taken to surgery by Dr. [name] for irrigation and debridement. Operative findings were not consistent with necrotizing fasciitis. The patient was started on intravenous antibiotics pending culture data. Assessment/Plan: Necrotic diabetic foot infection -Appreciate Orthopedics -s/p emergent I&D 10/28/13--Dr. [name] -no evidence of necrotizing fasciitis or septic tenosynovitis on operative findings -Continue intravenous vancomycin, and straining, clindamycin pending culture data -Wound care per orthopedic service -Improve glycemic control Diabetes mellitus type 2, uncontrolled with complications -Continue Lantus -NovoLog sliding scale -Adjust insulin pending CBG results -Hemoglobin A1c Hypertension -Fair control -Continue monitor -Continue lisinopril and hydralazine Hyperlipidemia -Continue statin Tobacco abuse -Tobacco cessation discussed   Family Communication:   Pt at beside Disposition Plan:   Home when medically stable     Antibiotics:  Vancomycin 10/28/13>>>  Aztreonam 10/28/13>>>  Clindamycin 10/28/13>>>     Procedures/Studies: Dg Chest 2 View  09/30/2013   CLINICAL DATA:  Headache. Dizziness. Hypertension and diabetes. Tobacco use.  EXAM: CHEST  2 VIEW  COMPARISON:  01/10/2013  FINDINGS: Atherosclerotic aortic arch.  Mild thoracic spondylosis.  The lungs appear clear. Heart size within normal limits. No pleural effusion.  IMPRESSION: 1. No acute findings. 2. Atherosclerotic aortic arch.    Electronically Signed   By: Sherryl Barters M.D.   On: 09/30/2013 18:34   Ct Head Wo Contrast  09/30/2013   CLINICAL DATA:  The patient became lethargic at 3 o'clock today.  EXAM: CT HEAD WITHOUT CONTRAST  TECHNIQUE: Contiguous axial images were obtained from the base of the skull through the vertex without intravenous contrast.  COMPARISON:  July 08, 2013  FINDINGS: There is chronic diffuse atrophy. Chronic bilateral periventricular white matter small vessel ischemic changes identified. There are small old lacune infarctions in the left basal ganglia and right thalamus unchanged. There is no midline shift, hydrocephalus, or mass. No acute hemorrhage or acute transcortical infarct is identified. The bony calvarium is intact. There is minimal mucoperiosteal thickening of the left ethmoid sinus.  IMPRESSION: No focal acute intracranial abnormality identified. Stable chronic changes.   Electronically Signed   By: Abelardo Diesel M.D.   On: 09/30/2013 18:23   Dg Chest Portable 1 View  10/29/2013   CLINICAL DATA:  Preoperative chest radiograph for right foot surgery.  EXAM: PORTABLE CHEST - 1 VIEW  COMPARISON:  Chest radiograph performed 09/30/2013  FINDINGS: The lungs are well-aerated and clear. There is no evidence of focal opacification, pleural effusion or pneumothorax.  The cardiomediastinal silhouette is within normal limits. No acute osseous abnormalities are seen.  IMPRESSION: No acute cardiopulmonary process seen.   Electronically Signed   By: [name] M.D.   On: 10/29/2013 01:37   Dg Foot Complete Right  10/28/2013   CLINICAL DATA:  Right foot pain and swelling.  EXAM: RIGHT FOOT COMPLETE - 3+ VIEW  COMPARISON:  Right foot radiographs performed 09/15/2013  FINDINGS: There is no evidence of fracture or dislocation. There appears to be chronic disruption of the fifth proximal interphalangeal joint. The joint spaces are preserved. There is no evidence of talar subluxation; the subtalar joint is  unremarkable in appearance.  Prominent soft tissue swelling and soft tissue air is noted between the first and second toes, along the plantar surface of the forefoot.  Diffuse vascular calcifications are seen.  IMPRESSION: 1. Prominent soft tissue swelling and soft tissue air noted between the first and second toes, along the plantar surface of the forefoot. This is compatible with gangrene; necrotizing fasciitis cannot be excluded. Would correlate clinically. 2. No evidence of acute fracture or dislocation; no definite osseous erosions identified. 3. Diffuse vascular calcifications seen. 4. 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   Electronically Signed   By: [name] M.D.   On: 10/28/2013 23:08         Subjective: Patient complains of pain in the right foot with the surgical site.  Patient denies fevers, chills, headache, chest pain, dyspnea, nausea, vomiting, diarrhea, abdominal pain, dysuria, hematuria   Objective: Filed Vitals:   10/29/13 0445 10/29/13 0600 10/29/13 0700 10/29/13 0800  BP: 148/73 136/64 146/65 135/51  Pulse: 84 78 84 72  Temp: 98.2 F (36.8 C)  98 F (36.7 C)   TempSrc: Oral  Oral   Resp: 14 11 17 17   Height:      Weight:      SpO2: 100% 100% 100% 100%    Intake/Output Summary (Last 24 hours) at 10/29/13 0841 Last data filed at 10/29/13 0827  Gross per 24 hour  Intake 2041.67 ml  Output    475 ml  Net 1566.67 ml   Weight change:  Exam:   General:  Pt is alert, follows commands appropriately, not in acute distress  HEENT: No icterus, No thrush,  [place]/AT  Cardiovascular: RRR, S1/S2, no rubs, no gallops  Respiratory: CTA bilaterally, no wheezing, no crackles, no rhonchi  Abdomen: Soft/+BS, non tender, non distended, no guarding  Extremities: L-BKA--no erythema, drainage, crepitance. Right foot presently in bulky dressing without any active bleeding.  Data Reviewed: Basic Metabolic Panel:  Recent Labs Lab  10/28/13 2132  NA 141  K 3.6*  CL 99  CO2 26  GLUCOSE 175*  BUN 13  CREATININE 0.72  CALCIUM 9.1   Liver Function Tests: No results found for this basename: AST, ALT, ALKPHOS, BILITOT, PROT, ALBUMIN,  in the last 168 hours No results found for this basename: LIPASE, AMYLASE,  in the last 168 hours No results found for this basename: AMMONIA,  in the last 168 hours CBC:  Recent Labs Lab 10/28/13 2132  WBC 15.8*  HGB 10.5*  HCT 32.9*  MCV 81.8  PLT 310   Cardiac Enzymes: No results found for this basename: CKTOTAL, CKMB, CKMBINDEX, TROPONINI,  in the last 168 hours BNP: No components found with this basename: POCBNP,  CBG:  Recent Labs Lab 10/28/13 1748 10/28/13 2144 10/29/13 0219  GLUCAP 148* 150* 183*    Recent Results (from the past 240 hour(s))  MRSA PCR SCREENING     Status: None   Collection Time    10/29/13  3:15 AM      Result Value Ref Range Status   MRSA by PCR NEGATIVE  NEGATIVE Final   Comment:            The GeneXpert MRSA Assay (FDA     approved for NASAL specimens     only), is one component of a     comprehensive MRSA colonization     surveillance program. It is not     intended to diagnose MRSA     infection nor  to guide or     monitor treatment for     MRSA infections.     Scheduled Meds: . atorvastatin  40 mg Oral q1800  . aztreonam  2 g Intravenous 3 times per day  . clindamycin (CLEOCIN) IV  600 mg Intravenous 3 times per day  . hydrALAZINE  25 mg Oral QID  . insulin aspart  0-15 Units Subcutaneous TID WC  . insulin glargine  20 Units Subcutaneous QHS  . lisinopril  20 mg Oral Daily  . vancomycin  1,000 mg Intravenous Once  . vancomycin  1,000 mg Intravenous Q12H   Continuous Infusions: . sodium chloride 125 mL/hr at 10/29/13 0600     Zuleica Seith, DO  [facility] Pager [phone]  If 7PM-7AM, please contact night-coverage [email or web address] Password [email or web address] 10/29/2013, 8:41 AM   LOS: 1 day

## 2013-10-29 NOTE — ED Notes (Signed)
OR notified that the pt needs to be medically cleared by Dr. Marin Comment before she can be taken to the OR.

## 2013-10-29 NOTE — ED Notes (Signed)
OR is ready for the pt.

## 2013-10-30 DIAGNOSIS — I10 Essential (primary) hypertension: Secondary | ICD-10-CM

## 2013-10-30 LAB — CBC
HCT: 28.5 % — ABNORMAL LOW (ref 36.0–46.0)
Hemoglobin: 9 g/dL — ABNORMAL LOW (ref 12.0–15.0)
MCH: 26.4 pg (ref 26.0–34.0)
MCHC: 31.6 g/dL (ref 30.0–36.0)
MCV: 83.6 fL (ref 78.0–100.0)
PLATELETS: 271 10*3/uL (ref 150–400)
RBC: 3.41 MIL/uL — ABNORMAL LOW (ref 3.87–5.11)
RDW: 14.6 % (ref 11.5–15.5)
WBC: 9.8 10*3/uL (ref 4.0–10.5)

## 2013-10-30 LAB — BASIC METABOLIC PANEL
BUN: 12 mg/dL (ref 6–23)
CALCIUM: 7.7 mg/dL — AB (ref 8.4–10.5)
CO2: 25 mEq/L (ref 19–32)
CREATININE: 0.8 mg/dL (ref 0.50–1.10)
Chloride: 105 mEq/L (ref 96–112)
GFR calc non Af Amer: 79 mL/min — ABNORMAL LOW (ref >90)
Glucose, Bld: 161 mg/dL — ABNORMAL HIGH (ref 70–99)
Potassium: 3.5 mEq/L — ABNORMAL LOW (ref 3.7–5.3)
Sodium: 142 mEq/L (ref 137–147)

## 2013-10-30 LAB — CK: CK TOTAL: 132 U/L (ref 7–177)

## 2013-10-30 LAB — GLUCOSE, CAPILLARY
GLUCOSE-CAPILLARY: 138 mg/dL — AB (ref 70–99)
GLUCOSE-CAPILLARY: 125 mg/dL — AB (ref 70–99)
Glucose-Capillary: 160 mg/dL — ABNORMAL HIGH (ref 70–99)
Glucose-Capillary: 134 mg/dL — ABNORMAL HIGH (ref 70–99)

## 2013-10-30 LAB — MAGNESIUM: MAGNESIUM: 1.7 mg/dL (ref 1.5–2.5)

## 2013-10-30 MED ORDER — ASPIRIN 81 MG PO CHEW
81.0000 mg | CHEWABLE_TABLET | Freq: Every day | ORAL | Status: DC
  Administered 2013-10-30 – 2013-11-04 (×6): 81 mg via ORAL
  Filled 2013-10-30 (×6): qty 1

## 2013-10-30 MED ORDER — INSULIN GLARGINE 100 UNIT/ML ~~LOC~~ SOLN
24.0000 [IU] | Freq: Every day | SUBCUTANEOUS | Status: DC
  Administered 2013-10-30 – 2013-11-03 (×5): 24 [IU] via SUBCUTANEOUS
  Filled 2013-10-30 (×6): qty 0.24

## 2013-10-30 NOTE — Progress Notes (Signed)
Physical Therapy Wound Treatment Patient Details  Name: [name] MRN: [id number] Date of Birth: Jul 19, 1955  Today's Date: 10/30/2013 Time: 0819-0901 Time Calculation (min): 42 min  Subjective  Subjective: "It doesn't hurt" Patient and Family Stated Goals: To get foot better  Pain Score:  No pain  Wound Assessment  Wound / Incision (Open or Dehisced) 10/29/13 Diabetic ulcer Foot Left surgical debridement of necrotizing fascitis on plantar surface prox. to toes 1 and 2 (Active)  Dressing Type ABD;Compression wrap;Gauze (Comment) 10/30/2013 11:17 AM  Dressing Changed Changed 10/30/2013 11:17 AM  Dressing Status Clean;Dry;Intact 10/30/2013 11:17 AM  Dressing Change Frequency Daily 10/30/2013 11:17 AM  Site / Wound Assessment Pink;Pale;Yellow;Other (Comment) 10/30/2013 11:17 AM  % Wound base Red or Granulating 10% 10/30/2013 11:17 AM  % Wound base Yellow 50% 10/30/2013 11:17 AM  % Wound base Other (Comment) 40% 10/30/2013 11:17 AM  Peri-wound Assessment Intact;Erythema (non-blanchable);Other (Comment) 10/30/2013 11:17 AM  Margins Unattached edges (unapproximated) 10/30/2013 11:17 AM  Closure None 10/30/2013 11:17 AM  Drainage Amount Minimal 10/30/2013 11:17 AM  Drainage Description Serous 10/30/2013 11:17 AM  Non-staged Wound Description Full thickness 10/30/2013 11:17 AM  Treatment Cleansed;Debridement (Selective);Hydrotherapy (Pulse lavage);Packing (Saline gauze) 10/30/2013 11:17 AM      Hydrotherapy Pulsed lavage therapy - wound location: Left foot surgical wound Pulsed Lavage with Suction (psi): 8 psi Pulsed Lavage with Suction - Normal Saline Used: 1000 mL Pulsed Lavage Tip: Tip with splash shield Selective Debridement Selective Debridement - Location: plant surface wound L foot Selective Debridement - Tools Used: Forceps;Scissors Selective Debridement - Tissue Removed: necrotic adipose and tissue, slough   Wound Assessment and Plan  Wound Therapy -  Assess/Plan/Recommendations Wound Therapy - Clinical Statement: Significant wound to Lt foot. Wound Therapy - Functional Problem List: This wound will limit pts weightbearing and rehab Factors Delaying/Impairing Wound Healing: Diabetes Mellitus;Infection - systemic/local;Vascular compromise Hydrotherapy Plan: Debridement;Dressing change;Patient/family education;Pulsatile lavage with suction Wound Therapy - Frequency: 6X / week Wound Therapy - Follow Up Recommendations: Skilled nursing facility Wound Plan: Continue PLS, debridement and dressing changes to assist improvement in the character of the wound.  Wound Therapy Goals- Improve the function of patient's integumentary system by progressing the wound(s) through the phases of wound healing (inflammation - proliferation - remodeling) by: Decrease Necrotic Tissue to: 30 Decrease Necrotic Tissue - Progress: Progressing toward goal Increase Granulation Tissue to: 70 (including any viable structures incl tendon, capsule) Increase Granulation Tissue - Progress: Progressing toward goal Improve Drainage Characteristics: Serous;Min Improve Drainage Characteristics - Progress: Progressing toward goal Wound Therapy - Potential for Goals: Fair  Goals will be updated until maximal potential achieved or discharge criteria met.  Discharge criteria: when goals achieved, discharge from hospital, MD decision/surgical intervention, no progress towards goals, refusal/missing three consecutive treatments without notification or medical reason.  GP     Despina Pole 10/30/2013, 11:26 AM Carita Pian. Sanjuana Kava, [place] Pager [phone]

## 2013-10-30 NOTE — Progress Notes (Signed)
   Subjective: 1 Day Post-Op Procedure(s) (LRB): IRRIGATION AND DEBRIDEMENT EXTREMITY (Right) Patient reports pain as mild.   Patient seen in rounds with Dr. Wynelle Link. Patient is well, and has had no acute complaints or problems  Objective: Vital signs in last 24 hours: Temp:  [97.5 F (36.4 C)-98.4 F (36.9 C)] 98.4 F (36.9 C) (06/14 0544) Pulse Rate:  [80-92] 80 (06/14 0544) Resp:  [16-18] 18 (06/14 0544) BP: (118-149)/(52-103) 134/52 mmHg (06/14 0544) SpO2:  [97 %-100 %] 100 % (06/14 0544)  Intake/Output from previous day:  Intake/Output Summary (Last 24 hours) at 10/30/13 0843 Last data filed at 10/30/13 0419  Gross per 24 hour  Intake    960 ml  Output    350 ml  Net    610 ml    Intake/Output this shift:    Labs:  Recent Labs  10/28/13 2132 10/30/13 0605  HGB 10.5* 9.0*    Recent Labs  10/28/13 2132 10/30/13 0605  WBC 15.8* 9.8  RBC 4.02 3.41*  HCT 32.9* 28.5*  PLT 310 271    Recent Labs  10/28/13 2132 10/30/13 0605  NA 141 142  K 3.6* 3.5*  CL 99 105  CO2 26 25  BUN 13 12  CREATININE 0.72 0.80  GLUCOSE 175* 161*  CALCIUM 9.1 7.7*   No results found for this basename: LABPT, INR,  in the last 72 hours  EXAM General - Patient is Alert and Appropriate Extremity - soft dressing intact, not removed on rounds. Dressing/Incision - clean, dry, no drainage  Past Medical History  Diagnosis Date  . Atypical chest pain     a. non obstructive by cath in 2008 and 2010;  b. 02/2012 Myoview: non-ischemic, EF 57%  . HTN (hypertension)   . Hyperlipidemia   . Syncope and collapse     a. near-syncopal episode in November 2008;  b. 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  . CVA (cerebral infarction)     a.  Small right parietal noted incidentally 04/2007;  b. right sided embolic CVA 07/7856;  c. 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 Peripheral  neuropathy   . Obesity, unspecified   . Esophageal reflux   . Diabetes mellitus, type II   . Diaphragmatic hernia without mention of obstruction or gangrene   . Irritable bowel syndrome   . Morbid obesity   . Cellulitis     a. 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 Stroke     2013-'no residual'  . Vertigo   . Dry skin   . Constipation   . Tobacco abuse     Assessment/Plan: 1 Day Post-Op Procedure(s) (LRB): IRRIGATION AND DEBRIDEMENT EXTREMITY (Right) Principal Problem:   Gas gangrene of foot Active Problems:   HYPERLIPIDEMIA   HYPERTENSION   DM type 2, uncontrolled, with neuropathy   HTN (hypertension)   Diabetic ulcer of right foot   Leukocytosis, unspecified   Type 2 diabetes mellitus with right diabetic foot infection   Foot abscess, right  Estimated body mass index is 28.16 kg/(m^2) as calculated from the following:   Height as of this encounter: 5\' 6"  (1.676 m).   Weight as of this encounter: 79.1 kg (174 lb 6.1 oz).  Going for hydrotherapy at this time.  Arlee Muslim, PA-C Orthopaedic Surgery 10/30/2013, 8:43 AM

## 2013-10-30 NOTE — Progress Notes (Signed)
Utilization Review Completed.[name] T6/14/2015  

## 2013-10-30 NOTE — Consult Note (Signed)
WOC wound consult note Reason for Consult: Patient consult received and orthopedics (simultaneously consulted) has already seen and performed operative procedure.  Wound care as directed by Orthopedics. [facility] Nurse did not see. Wound type:Intectious Pressure Ulcer POA: No WOC nursing team will not follow, but will remain available to this patient, the nursing, surgical and medical teams.  Please re-consult if needed. Thanks, Maudie Flakes, MSN, RN, [place], [place], [place] [phone])

## 2013-10-30 NOTE — Progress Notes (Signed)
PROGRESS NOTE  [name] JFH:[id number] DOB: 1955/06/20 DOA: 10/28/2013 PCP: [name], MD  Brief history  58 year old female with a history of hypertension, diabetes mellitus, stroke, hyperlipidemia presents with two-day history of increasing right foot pain, edema, erythema. She denies any recent injuries or trauma. In emergency department, x-ray of the foot revealed soft tissue gas. There was concern for dressing fasciitis. The patient was emergently taken to surgery by Dr. [name] for irrigation and debridement. Operative findings were not consistent with necrotizing fasciitis. The patient was started on intravenous antibiotics pending culture data.  Assessment/Plan:  Necrotic diabetic foot infection  -Appreciate Orthopedics  -s/p emergent I&D 10/28/13--Dr. [name]  -no evidence of necrotizing fasciitis or septic tenosynovitis on operative findings  -Continue intravenous vancomycin, and straining, clindamycin pending culture data  -Wound care per orthopedic service  -Improve glycemic control  Diabetes mellitus type 2, uncontrolled with complications  -increase Lantus to 24 units -NovoLog sliding scale  -Adjust insulin pending CBG results  -Hemoglobin A1c  Hypertension  -Fair control  -Continue monitor  -Continue lisinopril and hydralazine  -start ASA 81mg  Hyperlipidemia  -Continue statin  Tobacco abuse  -Tobacco cessation discussed   Family Communication: Pt at beside  Disposition Plan: Home when medically stable  Antibiotics:  Vancomycin 10/28/13>>>  Aztreonam 10/28/13>>>  Clindamycin 10/28/13>>>      Procedures/Studies: Dg Chest 2 View  09/30/2013   CLINICAL DATA:  Headache. Dizziness. Hypertension and diabetes. Tobacco use.  EXAM: CHEST  2 VIEW  COMPARISON:  01/10/2013  FINDINGS: Atherosclerotic aortic arch.  Mild thoracic spondylosis.  The lungs appear clear. Heart size within normal limits. No pleural effusion.  IMPRESSION: 1. No acute findings. 2.  Atherosclerotic aortic arch.   Electronically Signed   By: Sherryl Barters M.D.   On: 09/30/2013 18:34   Ct Head Wo Contrast  09/30/2013   CLINICAL DATA:  The patient became lethargic at 3 o'clock today.  EXAM: CT HEAD WITHOUT CONTRAST  TECHNIQUE: Contiguous axial images were obtained from the base of the skull through the vertex without intravenous contrast.  COMPARISON:  July 08, 2013  FINDINGS: There is chronic diffuse atrophy. Chronic bilateral periventricular white matter small vessel ischemic changes identified. There are small old lacune infarctions in the left basal ganglia and right thalamus unchanged. There is no midline shift, hydrocephalus, or mass. No acute hemorrhage or acute transcortical infarct is identified. The bony calvarium is intact. There is minimal mucoperiosteal thickening of the left ethmoid sinus.  IMPRESSION: No focal acute intracranial abnormality identified. Stable chronic changes.   Electronically Signed   By: Abelardo Diesel M.D.   On: 09/30/2013 18:23   Dg Chest Portable 1 View  10/29/2013   CLINICAL DATA:  Preoperative chest radiograph for right foot surgery.  EXAM: PORTABLE CHEST - 1 VIEW  COMPARISON:  Chest radiograph performed 09/30/2013  FINDINGS: The lungs are well-aerated and clear. There is no evidence of focal opacification, pleural effusion or pneumothorax.  The cardiomediastinal silhouette is within normal limits. No acute osseous abnormalities are seen.  IMPRESSION: No acute cardiopulmonary process seen.   Electronically Signed   By: [name] M.D.   On: 10/29/2013 01:37   Dg Foot Complete Right  10/28/2013   CLINICAL DATA:  Right foot pain and swelling.  EXAM: RIGHT FOOT COMPLETE - 3+ VIEW  COMPARISON:  Right foot radiographs performed 09/15/2013  FINDINGS: There is no evidence of fracture or dislocation. There appears to be chronic disruption of  the fifth proximal interphalangeal joint. The joint spaces are preserved. There is no evidence of talar  subluxation; the subtalar joint is unremarkable in appearance.  Prominent soft tissue swelling and soft tissue air is noted between the first and second toes, along the plantar surface of the forefoot.  Diffuse vascular calcifications are seen.  IMPRESSION: 1. Prominent soft tissue swelling and soft tissue air noted between the first and second toes, along the plantar surface of the forefoot. This is compatible with gangrene; necrotizing fasciitis cannot be excluded. Would correlate clinically. 2. No evidence of acute fracture or dislocation; no definite osseous erosions identified. 3. Diffuse vascular calcifications seen. 4. 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   Electronically Signed   By: [name] M.D.   On: 10/28/2013 23:08         Subjective: Patient complains of occasional pain in the right foot.  Patient denies fevers, chills, headache, chest pain, dyspnea, nausea, vomiting, diarrhea, abdominal pain, dysuria, hematuria   Objective: Filed Vitals:   10/29/13 1709 10/29/13 2039 10/30/13 0544 10/30/13 1414  BP: 149/77 129/103 134/52 168/68  Pulse: 92 88 80 82  Temp: 97.5 F (36.4 C) 98.4 F (36.9 C) 98.4 F (36.9 C) 98.8 F (37.1 C)  TempSrc: Axillary Oral Oral   Resp: 18 18 18 18   Height:      Weight:      SpO2: 97% 97% 100% 98%    Intake/Output Summary (Last 24 hours) at 10/30/13 1602 Last data filed at 10/30/13 1300  Gross per 24 hour  Intake   1560 ml  Output    200 ml  Net   1360 ml   Weight change:  Exam:   General:  Pt is alert, follows commands appropriately, not in acute distress  HEENT: No icterus, No thrush,  [place]/AT  Cardiovascular: RRR, S1/S2, no rubs, no gallops  Respiratory: CTA bilaterally, no wheezing, no crackles, no rhonchi  Abdomen: Soft/+BS, non tender, non distended, no guarding Extremities: L-BKA--no erythema, drainage, crepitance. Right foot presently in bulky dressing without any active  bleeding.   Data Reviewed: Basic Metabolic Panel:  Recent Labs Lab 10/28/13 2132 10/30/13 0605  NA 141 142  K 3.6* 3.5*  CL 99 105  CO2 26 25  GLUCOSE 175* 161*  BUN 13 12  CREATININE 0.72 0.80  CALCIUM 9.1 7.7*  MG  --  1.7   Liver Function Tests: No results found for this basename: AST, ALT, ALKPHOS, BILITOT, PROT, ALBUMIN,  in the last 168 hours No results found for this basename: LIPASE, AMYLASE,  in the last 168 hours No results found for this basename: AMMONIA,  in the last 168 hours CBC:  Recent Labs Lab 10/28/13 2132 10/30/13 0605  WBC 15.8* 9.8  HGB 10.5* 9.0*  HCT 32.9* 28.5*  MCV 81.8 83.6  PLT 310 271   Cardiac Enzymes:  Recent Labs Lab 10/30/13 0605  CKTOTAL 132   BNP: No components found with this basename: POCBNP,  CBG:  Recent Labs Lab 10/29/13 1255 10/29/13 1654 10/29/13 2123 10/30/13 0620 10/30/13 1115  GLUCAP 180* 157* 179* 160* 134*    Recent Results (from the past 240 hour(s))  CULTURE, BLOOD (ROUTINE X 2)     Status: None   Collection Time    10/29/13 12:45 AM      Result Value Ref Range Status   Specimen Description BLOOD LEFT FOREARM   Final   Special Requests BOTTLES DRAWN AEROBIC AND ANAEROBIC 5CC EACH  Final   Culture  Setup Time     Final   Value: 10/29/2013 12:13     Performed at [facility]   Culture     Final   Value:        BLOOD CULTURE RECEIVED NO GROWTH TO DATE CULTURE WILL BE HELD FOR 5 DAYS BEFORE ISSUING A FINAL NEGATIVE REPORT     Performed at [facility]   Report Status PENDING   Incomplete  WOUND CULTURE     Status: None   Collection Time    10/29/13  2:45 AM      Result Value Ref Range Status   Specimen Description WOUND RIGHT FOOT   Final   Special Requests PT ON CLEOCIN, VANCOMYCIN, AZTREONAM   Final   Gram Stain PENDING   Incomplete   Culture     Final   Value: Culture reincubated for better growth     Performed at [facility]   Report Status PENDING   Incomplete   ANAEROBIC CULTURE     Status: None   Collection Time    10/29/13  2:45 AM      Result Value Ref Range Status   Specimen Description WOUND RIGHT FOOT   Final   Special Requests PT ON CLEOCIN, VANCOMYCIN, AZTREONAM   Final   Gram Stain PENDING   Incomplete   Culture     Final   Value: NO ANAEROBES ISOLATED; CULTURE IN PROGRESS FOR 5 DAYS     Performed at [facility]   Report Status PENDING   Incomplete  MRSA PCR SCREENING     Status: None   Collection Time    10/29/13  3:15 AM      Result Value Ref Range Status   MRSA by PCR NEGATIVE  NEGATIVE Final   Comment:            The GeneXpert MRSA Assay (FDA     approved for NASAL specimens     only), is one component of a     comprehensive MRSA colonization     surveillance program. It is not     intended to diagnose MRSA     infection nor to guide or     monitor treatment for     MRSA infections.  CULTURE, BLOOD (ROUTINE X 2)     Status: None   Collection Time    10/29/13  4:00 AM      Result Value Ref Range Status   Specimen Description BLOOD RIGHT HAND   Final   Special Requests BOTTLES DRAWN AEROBIC ONLY 5CC   Final   Culture  Setup Time     Final   Value: 10/29/2013 12:13     Performed at [facility]   Culture     Final   Value:        BLOOD CULTURE RECEIVED NO GROWTH TO DATE CULTURE WILL BE HELD FOR 5 DAYS BEFORE ISSUING A FINAL NEGATIVE REPORT     Performed at [facility]   Report Status PENDING   Incomplete     Scheduled Meds: . atorvastatin  40 mg Oral q1800  . aztreonam  2 g Intravenous 3 times per day  . clindamycin (CLEOCIN) IV  600 mg Intravenous 3 times per day  . enoxaparin (LOVENOX) injection  40 mg Subcutaneous Q24H  . hydrALAZINE  25 mg Oral QID  . insulin aspart  0-15 Units Subcutaneous TID WC  . insulin glargine  20 Units Subcutaneous QHS  . lisinopril  20 mg Oral Daily  . vancomycin  1,000 mg Intravenous Once  . vancomycin  1,000 mg Intravenous Q12H   Continuous  Infusions: . sodium chloride 125 mL/hr (10/29/13 2147)     Ezme Duch, DO  [facility] Pager [phone]  If 7PM-7AM, please contact night-coverage [email or web address] Password [email or web address] 10/30/2013, 4:02 PM   LOS: 2 days

## 2013-10-31 ENCOUNTER — Encounter (HOSPITAL_COMMUNITY): Payer: Self-pay | Admitting: Specialist

## 2013-10-31 DIAGNOSIS — I70269 Atherosclerosis of native arteries of extremities with gangrene, unspecified extremity: Secondary | ICD-10-CM

## 2013-10-31 LAB — BASIC METABOLIC PANEL
BUN: 10 mg/dL (ref 6–23)
CHLORIDE: 104 meq/L (ref 96–112)
CO2: 24 mEq/L (ref 19–32)
Calcium: 7.7 mg/dL — ABNORMAL LOW (ref 8.4–10.5)
Creatinine, Ser: 0.69 mg/dL (ref 0.50–1.10)
Glucose, Bld: 95 mg/dL (ref 70–99)
Potassium: 3.3 mEq/L — ABNORMAL LOW (ref 3.7–5.3)
Sodium: 140 mEq/L (ref 137–147)

## 2013-10-31 LAB — URINALYSIS, ROUTINE W REFLEX MICROSCOPIC
Bilirubin Urine: NEGATIVE
Glucose, UA: NEGATIVE mg/dL
Hgb urine dipstick: NEGATIVE
Ketones, ur: NEGATIVE mg/dL
Leukocytes, UA: NEGATIVE
NITRITE: NEGATIVE
PH: 6.5 (ref 5.0–8.0)
Protein, ur: NEGATIVE mg/dL
SPECIFIC GRAVITY, URINE: 1.012 (ref 1.005–1.030)
Urobilinogen, UA: 2 mg/dL — ABNORMAL HIGH (ref 0.0–1.0)

## 2013-10-31 LAB — WOUND CULTURE: Gram Stain: NONE SEEN

## 2013-10-31 LAB — CBC
HEMATOCRIT: 28.5 % — AB (ref 36.0–46.0)
Hemoglobin: 9.3 g/dL — ABNORMAL LOW (ref 12.0–15.0)
MCH: 26.3 pg (ref 26.0–34.0)
MCHC: 32.6 g/dL (ref 30.0–36.0)
MCV: 80.5 fL (ref 78.0–100.0)
Platelets: 310 10*3/uL (ref 150–400)
RBC: 3.54 MIL/uL — ABNORMAL LOW (ref 3.87–5.11)
RDW: 14.4 % (ref 11.5–15.5)
WBC: 14.4 10*3/uL — AB (ref 4.0–10.5)

## 2013-10-31 LAB — HEMOGLOBIN A1C
HEMOGLOBIN A1C: 7.3 % — AB (ref <5.7)
MEAN PLASMA GLUCOSE: 163 mg/dL — AB (ref <117)

## 2013-10-31 LAB — GLUCOSE, CAPILLARY
GLUCOSE-CAPILLARY: 109 mg/dL — AB (ref 70–99)
Glucose-Capillary: 152 mg/dL — ABNORMAL HIGH (ref 70–99)
Glucose-Capillary: 99 mg/dL (ref 70–99)
Glucose-Capillary: 132 mg/dL — ABNORMAL HIGH (ref 70–99)
Glucose-Capillary: 148 mg/dL — ABNORMAL HIGH (ref 70–99)

## 2013-10-31 MED ORDER — HYDRALAZINE HCL 50 MG PO TABS
50.0000 mg | ORAL_TABLET | Freq: Three times a day (TID) | ORAL | Status: DC
Start: 2013-10-31 — End: 2013-11-04
  Administered 2013-10-31 – 2013-11-04 (×10): 50 mg via ORAL
  Filled 2013-10-31 (×15): qty 1

## 2013-10-31 NOTE — Consult Note (Signed)
[facility] [place]  Reason for Consult:  Necrotic foot ulcer Referring Physician:  Tat MRN #:  [id number]  History of Present Illness: This is a 59 y.o. female who states that a few days ago, she started having pain in her right toe.  She states that it was red and aspirin helped a little bit.  The pain persisted and she presented to the hospital for further treatment.  She states that she has quit smoking for a couple of weeks.  She denies any hx of CP or MI.  She states that she has had mini strokes in the past, but these are remote and she does not have any residual from these.  Xray revealed that she may soft tissue gas and she was taken urgently to the OR for debridement for suspicion of necrotizing fascitis.  There was no evidence for this intra operatively.    She is on a statin for her cholesterol and ACEI for hypertension.  VVS is asked to consult on pt.  Past Medical History  Diagnosis Date  . Atypical chest pain     a. non obstructive by cath in 2008 and 2010;  b. 02/2012 Myoview: non-ischemic, EF 57%  . HTN (hypertension)   . Hyperlipidemia   . Syncope and collapse     a. near-syncopal episode in November 2008;  b. 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  . CVA (cerebral infarction)     a.  Small right parietal noted incidentally 04/2007;  b. right sided embolic CVA 08/1658;  c. 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 Peripheral neuropathy   . Obesity, unspecified   . Esophageal reflux   . Diabetes mellitus, type II   . Diaphragmatic hernia without mention of obstruction or gangrene   . Irritable bowel syndrome   . Morbid obesity   . Cellulitis     a. 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 Stroke     2013-'no residual'  . Vertigo   . Dry skin   . Constipation   . Tobacco abuse    Past Surgical History  Procedure Laterality Date  . Invasive  electrophysiologic study  5/09    followed by insertion of an implantable loop recorder. S/p removal   . Colonoscopy    . Cholecystectomy    . Multiple toe surgeries    . Cardiac catheterization      LAD 30%, circumflex 50%, OM 75%, RI 60% with small branch 80%, dominant RCA 60%, EF 45-50%  . I&d extremity  12/23/2011    Procedure: IRRIGATION AND DEBRIDEMENT EXTREMITY;  Surgeon: Augustin Schooling, MD;  Location: [place];  Service: Orthopedics;  Laterality: Left;  Left Foot  . I&d extremity  12/26/2011    Procedure: IRRIGATION AND DEBRIDEMENT EXTREMITY;  Surgeon: Wylene Simmer, MD;  Location: [place];  Service: Orthopedics;  Laterality: Left;  LEFT FOOT I&D WITH POSSIBLE WOUND VAC APPLICATION, POSSIBLE LEFT FIRST RAY AMPUTATION  . Amputation  12/30/2011    Procedure: AMPUTATION RAY;  Surgeon: Wylene Simmer, MD;  Location: [place];  Service: Orthopedics;  Laterality: Left;  LEFT FIRST RAY AMPUTATION  . Eye surgery      cataract  . Amputation  01/13/2012    Procedure: AMPUTATION BELOW KNEE;  Surgeon: Wylene Simmer, MD;  Location: [place];  Service: Orthopedics;  Laterality: Left;  . Amputation  03/04/2012  Procedure: AMPUTATION BELOW KNEE;  Surgeon: Wylene Simmer, MD;  Location: [place];  Service: Orthopedics;  Laterality: Left;  Revision of Left Below Knee Amputation  . Tee without cardioversion N/A 07/07/2012    Procedure: TRANSESOPHAGEAL ECHOCARDIOGRAM (TEE);  Surgeon: Lelon Perla, MD;  Location[facility] END[facility];  Service: Cardiovascular;  Laterality: N/A;    Allergies  Allergen Reactions  . Banana Swelling and Other (See Comments)    Facial swelling  . Penicillins Itching, Swelling and Rash    Face swells and break out  . Strawberry Swelling and Other (See Comments)    Facial swelling    Prior to Admission medications   Medication Sig Start Date End Date Taking? Authorizing Provider  glimepiride (AMARYL) 4 MG tablet Take 4 mg by mouth daily with breakfast.   Yes Historical Provider, MD  hydrALAZINE  (APRESOLINE) 25 MG tablet Take 25 mg by mouth 4 (four) times daily.   Yes Historical Provider, MD  hydrochlorothiazide (HYDRODIURIL) 25 MG tablet Take 25 mg by mouth every morning.   Yes Historical Provider, MD  ibuprofen (ADVIL,MOTRIN) 800 MG tablet Take 800 mg by mouth every 8 (eight) hours as needed for moderate pain.   Yes Historical Provider, MD  insulin glargine (LANTUS) 100 UNIT/ML injection Inject 25 Units into the skin at bedtime.   Yes Historical Provider, MD  lisinopril (PRINIVIL,ZESTRIL) 20 MG tablet Take 1 tablet (20 mg total) by mouth daily. 01/21/13  Yes Daniel J Angiulli, PA-C  simvastatin (ZOCOR) 80 MG tablet Take 1 tablet (80 mg total) by mouth every evening. 01/21/13  Yes Lavon Paganini Angiulli, PA-C    History   Social History  . Marital Status: Single    Spouse Name: N/A    Number of Children: N/A  . Years of Education: N/A   Occupational History  . Document [employer] company   Social History Main Topics  . Smoking status: Current Every Day Smoker -- 0.25 packs/day for 40 years    Types: Cigarettes  . Smokeless tobacco: Never Used  . 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  . Drug Use: No  . Sexual Activity: Not Currently    Birth Control/ Protection: Post-menopausal   Other Topics Concern  . Not on file   Social History Narrative   She drinks beer. Several on sundays. Does not routinely exercise. 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      Family History  Problem Relation Age of Onset  . Pneumonia Mother   . Gallbladder disease Mother     cancer  . Heart failure Mother   . Diabetes Father   . Coronary artery disease Father   . Stroke Neg Hx     ROS: [x]  Positive   [ ]  Negative   [ ]  All sytems reviewed and are negative  Cardiovascular: []  chest pain/pressure []  palpitations []  SOB lying flat []  DOE []  pain in legs while  walking []  pain in legs at rest []  pain in legs at night []  non-healing ulcers []  hx of DVT []  swelling in legs  Pulmonary: []  productive cough []  asthma/wheezing []  home O2  Neurologic: []  weakness in []  arms []  legs []  numbness in []  arms []  legs [x]  hx of CVA []  mini stroke [] difficulty speaking or slurred speech []  temporary loss of vision in one eye []   dizziness  Hematologic: []  hx of cancer []  bleeding problems []  problems with blood clotting easily  Endocrine:   [x]  diabetes []  thyroid disease  GI []  vomiting blood []  blood in stool [x]  IBS  GU: []  CKD/renal failure []  HD--[]  M/W/F or []  T/T/S []  burning with urination []  blood in urine  Psychiatric: []  anxiety []  depression  Musculoskeletal: [x]  arthritis-mild []  joint pain  Integumentary: []  rashes [x]  ulcers-left foot  Constitutional: [x]  fever []  chills   Physical Examination  Filed Vitals:   10/31/13 0515  BP: 174/79  Pulse: 88  Temp: 100.6 F (38.1 C)  Resp: 20   Body mass index is 28.16 kg/(m^2).  General:  WDWN in NAD Gait: Not observed HENT: WNL, normocephalic Eyes: Pupils equal Pulmonary: normal non-labored breathing, without Rales, rhonchi,  wheezing Cardiac: regular, wit Murmurs, rubs or gallops; without carotid bruits Abdomen: soft, NT/ND, no masses Skin: without rashes, without ulcers  Vascular Exam/Pulses:  Right Left  Radial 2+ (normal) 2+ (normal)  Ulnar 1+ (weak) 1+ (weak)  Femoral 1+ (weak) (difficult to palpate as pt was in chair) Unable to palpate due to position in chair  Popliteal 2+ Unable to palpate  DP + doppler signal in AT Left BKA  PT + doppler signal PT Left BKA   Extremities:  Bandage is in place to left foot; left BKA is well healed Musculoskeletal: no muscle wasting or atrophy  Neurologic: A&O X 3; Appropriate Affect ; SENSATION: normal; MOTOR FUNCTION:  moving all extremities equally. Speech is fluent/normal Psychiatric:  Normal  affect Lymph:  No inguinal lymphadenopathy   CBC    Component Value Date/Time   WBC 14.4* 10/31/2013 0630   RBC 3.54* 10/31/2013 0630   RBC 4.37 07/02/2011 1130   HGB 9.3* 10/31/2013 0630   HCT 28.5* 10/31/2013 0630   PLT 310 10/31/2013 0630   MCV 80.5 10/31/2013 0630   MCH 26.3 10/31/2013 0630   MCHC 32.6 10/31/2013 0630   RDW 14.4 10/31/2013 0630   LYMPHSABS 1.6 10/07/2013 1416   MONOABS 0.4 10/07/2013 1416   EOSABS 0.0 10/07/2013 1416   BASOSABS 0.0 10/07/2013 1416    BMET    Component Value Date/Time   NA 140 10/31/2013 0630   K 3.3* 10/31/2013 0630   CL 104 10/31/2013 0630   CO2 24 10/31/2013 0630   GLUCOSE 95 10/31/2013 0630   BUN 10 10/31/2013 0630   CREATININE 0.69 10/31/2013 0630   CALCIUM 7.7* 10/31/2013 0630   GFRNONAA >90 10/31/2013 0630   GFRAA >90 10/31/2013 0630    COAGS: Lab Results  Component Value Date   INR 1.00 04/20/2013   INR 1.1* 06/29/2012   INR 0.92 06/24/2012     Non-Invasive Vascular Imaging:  ABI's 10/31/13  RIGHT    LEFT     PRESSURE  WAVEFORM   PRESSURE  WAVEFORM   BRACHIAL  121  Triphasic  BRACHIAL  175  Triphasic   AT  >300  Dampened monophasic  AT  BKA    PT  97  Dampened monophasic  PT  BKA      RIGHT  LEFT   ABI  AT calcificied PT 0.55  N/A BKA   Right - ABI not ascertained in the anterior tibial artery due to non compressible vessel probably secondary to calification. The ABI in the posterior tibial artery indicates a moderate to severe reduction in arterial flow. Left ABI could not be ascertained secondary to below the knee amputation.   Statin:  yes Beta  Blocker:  no Aspirin:  no ACEI:  yes ARB:  no Other antiplatelets/anticoagulants:  no (hospital-Lovenox)   ASSESSMENT/PLAN:: This is a 58 y.o. female with hx of DM presented with right foot pain and erythema.  She has undergone excisional debridement of diabetic ulcer measuring 3 x 4 cm on the plantar aspect of the 1st and 2nd toes down to the joint capsule and packed.  -VVS is asked to  evaluate pt -normal renal function-may need angiogram/possible intervention to evaluate RLE arterial anatomy to determine if wound will heal.   -bandage not removed as RN has just dressed the foot -Dr. [name] to see pt this afternoon.   Leontine Locket, PA-C [facility] [phone]  Agree with above. Patient admitted with diabetic foot infection right foot. Had debridement in the OR with a very poor healing. She had presented with a similar difficulty several years ago with her left foot and eventually require a below-knee amputation which healed. She does walk with a prosthesis in the help of a cane.  On physical exam she has an easily palpable popliteal pulse. She has very poor healing of necrotic metatarsal head a nonviable great toe. There is also extensive necrosis over the dorsum of her foot with full thickness loss over the dorsum not extending up to the ankle crease. There are markings on the skin where she had erythema and this has resolved.  Noninvasive Doppler study shows calcified vessels with probable some falsely elevated pressures. She does have audible flow at the posterior tibial and dorsalis pedis.  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 She does have normal flow to the popliteal level with an easily palpable popliteal pulse. I discussed this at length with the patient and also by telephone with Dr. [name]. I feel that it would be very unlikely that she would have any reconstructable possibility with normal flow to the popliteal level and extensive gangrene in her foot. Even if revascularization with possible, which is doubtful, this has a very little chance of making any difference regarding her level of amputation. With the extensive gangrene on the dorsum of her foot I do not feel that she has a salvageable situation. Dr. [name] had a similar opinion and will discuss below-knee  amputation with the patient. We will not follow actively. Please call if we can provide assistance

## 2013-10-31 NOTE — Progress Notes (Signed)
ANTIBIOTIC CONSULT NOTE - INITIAL  Pharmacy Consult for Vancomycin/Aztreonam Indication: Gangrenous foot wound, poss necrotizing fascitis   Allergies  Allergen Reactions  . Banana Swelling and Other (See Comments)    Facial swelling  . Penicillins Itching, Swelling and Rash    Face swells and break out  . Strawberry Swelling and Other (See Comments)    Facial swelling   Patient Measurements: ~71.2 kg  Vital Signs: Temp: 98.8 F (37.1 C) (06/15 1300) Temp src: Oral (06/15 0515) BP: 140/65 mmHg (06/15 1300) Pulse Rate: 87 (06/15 1300)  Labs:  Recent Labs  10/28/13 2132 10/30/13 0605 10/31/13 0630  WBC 15.8* 9.8 14.4*  HGB 10.5* 9.0* 9.3*  PLT 310 271 310  CREATININE 0.72 0.80 0.69   Medical History: Past Medical History  Diagnosis Date  . Atypical chest pain     a. non obstructive by cath in 2008 and 2010;  b. 02/2012 Myoview: non-ischemic, EF 57%  . HTN (hypertension)   . Hyperlipidemia   . Syncope and collapse     a. near-syncopal episode in November 2008;  b. 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  . CVA (cerebral infarction)     a.  Small right parietal noted incidentally 04/2007;  b. right sided embolic CVA 10/2128;  c. 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 Peripheral neuropathy   . Obesity, unspecified   . Esophageal reflux   . Diabetes mellitus, type II   . Diaphragmatic hernia without mention of obstruction or gangrene   . Irritable bowel syndrome   . Morbid obesity   . Cellulitis     a. 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 Stroke     2013-'no residual'  . Vertigo   . Dry skin   . Constipation   . Tobacco abuse    Assessment: 58 y/o F with gangrenous right foot wound with possible necrotizing fascitis. WBC elevated but trending down, renal function stable. Noted PCN allergy with facial swelling, discussed with EDP.   Antibiotics continue today (Day  4), possibly may need BKA soon.    Goal of Therapy:  Vancomycin trough level 15-20 mcg/ml  Plan:  - Vancomycin 1000 mg IV q12h - will check trough level tomorrow to ensure adequate dosing. - Aztreonam 2g IV q8h - Clindamycin per MD - Trend WBC, temp, renal function   Uvaldo Rising, BCPS  Clinical Pharmacist Pager [phone]  10/31/2013 2:47 PM

## 2013-10-31 NOTE — Progress Notes (Signed)
VASCULAR LAB PRELIMINARY  ARTERIAL  ABI completed:    RIGHT    LEFT    PRESSURE WAVEFORM  PRESSURE WAVEFORM  BRACHIAL 121 Triphasic BRACHIAL 175 Triphasic  AT >300 Dampened monophasic AT BKA   PT 97 Dampened monophasic PT BKA     RIGHT LEFT  ABI AT calcificied  PT 0.55 N/A BKA   Right - ABI not ascertained in the anterior tibial artery due to non compressible vessel probably secondary to calification. The ABI in the posterior tibial artery indicates a moderate to severe reduction in arterial flow. Left ABI could not be ascertained secondary to below the knee amputation.  Jaiquan Temme, RVS 10/31/2013, 8:10 AM

## 2013-10-31 NOTE — Consult Note (Signed)
Reason for Consult: Gangrene right forefoot Referring Physician: Dr. Corbin Ade is an 58 y.o. female.  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 She presents at this time with gangrenous ulceration first webspace with ischemic changes of the entire forefoot on the right.  Past Medical History  Diagnosis Date  . Atypical chest pain     a. non obstructive by cath in 2008 and 2010;  b. 02/2012 Myoview: non-ischemic, EF 57%  . HTN (hypertension)   . Hyperlipidemia   . Syncope and collapse     a. near-syncopal episode in November 2008;  b. 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  . CVA (cerebral infarction)     a.  Small right parietal noted incidentally 04/2007;  b. right sided embolic CVA 07/1515;  c. 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 Peripheral neuropathy   . Obesity, unspecified   . Esophageal reflux   . Diabetes mellitus, type II   . Diaphragmatic hernia without mention of obstruction or gangrene   . Irritable bowel syndrome   . Morbid obesity   . Cellulitis     a. 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 Stroke     2013-'no residual'  . Vertigo   . Dry skin   . Constipation   . Tobacco abuse     Past Surgical History  Procedure Laterality Date  . Invasive electrophysiologic study  5/09    followed by insertion of an implantable loop recorder. S/p removal   . Colonoscopy    . Cholecystectomy    . Multiple toe surgeries    . Cardiac catheterization      LAD 30%, circumflex 50%, OM 75%, RI 60% with small branch 80%, dominant RCA 60%, EF 45-50%  . I&d extremity  12/23/2011    Procedure: IRRIGATION AND DEBRIDEMENT EXTREMITY;  Surgeon: Augustin Schooling, MD;  Location: [place];  Service: Orthopedics;  Laterality: Left;  Left Foot  . I&d extremity  12/26/2011    Procedure: IRRIGATION AND  DEBRIDEMENT EXTREMITY;  Surgeon: Wylene Simmer, MD;  Location: [place];  Service: Orthopedics;  Laterality: Left;  LEFT FOOT I&D WITH POSSIBLE WOUND VAC APPLICATION, POSSIBLE LEFT FIRST RAY AMPUTATION  . Amputation  12/30/2011    Procedure: AMPUTATION RAY;  Surgeon: Wylene Simmer, MD;  Location: [place];  Service: Orthopedics;  Laterality: Left;  LEFT FIRST RAY AMPUTATION  . Eye surgery      cataract  . Amputation  01/13/2012    Procedure: AMPUTATION BELOW KNEE;  Surgeon: Wylene Simmer, MD;  Location: [place];  Service: Orthopedics;  Laterality: Left;  . Amputation  03/04/2012    Procedure: AMPUTATION BELOW KNEE;  Surgeon: Wylene Simmer, MD;  Location: [place];  Service: Orthopedics;  Laterality: Left;  Revision of Left Below Knee Amputation  . Tee without cardioversion N/A 07/07/2012    Procedure: TRANSESOPHAGEAL ECHOCARDIOGRAM (TEE);  Surgeon: Lelon Perla, MD;  [facility] END[facility];  Service: Cardiovascular;  Laterality: N/A;    Family History  Problem Relation Age of Onset  . Pneumonia Mother   . Gallbladder disease Mother     cancer  . Heart failure Mother   . Diabetes Father   . Coronary artery disease Father   . Stroke Neg Hx     Social History:  reports that  she has been smoking Cigarettes.  She has a 10 pack-year smoking history. She has never used smokeless tobacco. She reports that she drinks about 2.4 ounces of alcohol per week. She reports that she does not use illicit drugs.  Allergies:  Allergies  Allergen Reactions  . Banana Swelling and Other (See Comments)    Facial swelling  . Penicillins Itching, Swelling and Rash    Face swells and break out  . Strawberry Swelling and Other (See Comments)    Facial swelling    Medications: I have reviewed the patient's current medications.  Results for orders placed during the hospital encounter of 10/28/13 (from the past 48 hour(s))  GLUCOSE, CAPILLARY     Status: Abnormal   Collection Time    10/29/13 12:55 PM      Result Value  Ref Range   Glucose-Capillary 180 (*) 70 - 99 mg/dL  GLUCOSE, CAPILLARY     Status: Abnormal   Collection Time    10/29/13  4:54 PM      Result Value Ref Range   Glucose-Capillary 157 (*) 70 - 99 mg/dL  GLUCOSE, CAPILLARY     Status: Abnormal   Collection Time    10/29/13  9:23 PM      Result Value Ref Range   Glucose-Capillary 179 (*) 70 - 99 mg/dL  CBC     Status: Abnormal   Collection Time    10/30/13  6:05 AM      Result Value Ref Range   WBC 9.8  4.0 - 10.5 K/uL   RBC 3.41 (*) 3.87 - 5.11 MIL/uL   Hemoglobin 9.0 (*) 12.0 - 15.0 g/dL   HCT 19.8 (*) 02.2 - 17.9 %   MCV 83.6  78.0 - 100.0 fL   MCH 26.4  26.0 - 34.0 pg   MCHC 31.6  30.0 - 36.0 g/dL   RDW 81.0  25.4 - 86.2 %   Platelets 271  150 - 400 K/uL  CK     Status: None   Collection Time    10/30/13  6:05 AM      Result Value Ref Range   Total CK 132  7 - 177 U/L  MAGNESIUM     Status: None   Collection Time    10/30/13  6:05 AM      Result Value Ref Range   Magnesium 1.7  1.5 - 2.5 mg/dL  BASIC METABOLIC PANEL     Status: Abnormal   Collection Time    10/30/13  6:05 AM      Result Value Ref Range   Sodium 142  137 - 147 mEq/L   Potassium 3.5 (*) 3.7 - 5.3 mEq/L   Chloride 105  96 - 112 mEq/L   CO2 25  19 - 32 mEq/L   Glucose, Bld 161 (*) 70 - 99 mg/dL   BUN 12  6 - 23 mg/dL   Creatinine, Ser 8.24  0.50 - 1.10 mg/dL   Calcium 7.7 (*) 8.4 - 10.5 mg/dL   GFR calc non Af Amer 79 (*) >90 mL/min   GFR calc Af Amer >90  >90 mL/min   Comment: (NOTE)     The eGFR has been calculated using the CKD EPI equation.     This calculation has not been validated in all clinical situations.     eGFR's persistently <90 mL/min signify possible Chronic Kidney     Disease.  GLUCOSE, CAPILLARY     Status: Abnormal   Collection Time  10/30/13  6:20 AM      Result Value Ref Range   Glucose-Capillary 160 (*) 70 - 99 mg/dL  GLUCOSE, CAPILLARY     Status: Abnormal   Collection Time    10/30/13 11:15 AM      Result Value Ref  Range   Glucose-Capillary 134 (*) 70 - 99 mg/dL   Comment 1 Documented in Chart     Comment 2 Notify RN    GLUCOSE, CAPILLARY     Status: Abnormal   Collection Time    10/30/13  4:01 PM      Result Value Ref Range   Glucose-Capillary 138 (*) 70 - 99 mg/dL   Comment 1 Documented in Chart     Comment 2 Notify RN    GLUCOSE, CAPILLARY     Status: Abnormal   Collection Time    10/30/13  9:10 PM      Result Value Ref Range   Glucose-Capillary 125 (*) 70 - 99 mg/dL    No results found.  Review of Systems  All other systems reviewed and are negative.  Blood pressure 174/79, pulse 88, temperature 100.6 F (38.1 C), temperature source Oral, resp. rate 20, height $RemoveBe'5\' 6"'oTBwfNIGQ$  (1.676 m), weight 79.1 kg (174 lb 6.1 oz), SpO2 99.00%. Physical Exam On examination patient has thin atrophic skin of the right foot. She has a cold forefoot with very faintly palpable dorsalis pedis pulse. She has ischemic gangrenous changes up through the midfoot dorsally. There are no heel ulcers she has a gangrenous ulcer in the first webspace on the right. Radiographs show calcification of the vessels out to the forefoot. Assessment/Plan: Assessment: Cellulitis with gangrenous ulcers of the right first webspace with extremely changes up through the midfoot.  Plan: Recommend vascular consultation. I will request ankle-brachial indices as well. Without any improvement in her circulation patient most likely would require a transtibial amputation. We will await the results of the vascular evaluation.  Eoghan Belcher V 10/31/2013, 6:23 AM

## 2013-10-31 NOTE — Evaluation (Signed)
Physical Therapy Evaluation Patient Details Name: [name] MRN: [id number] DOB: 08/25/1955 Today's Date: 10/31/2013   History of Present Illness  58 y.o. female s/p right foot I&D. Significant Hx of Lt transtibial amputation, HTN, and CVA.  Clinical Impression  Patient is seen following the above procedure and presents with functional limitations due to the deficits listed below (see PT Problem List). Pt reports that she wants to go home but verbally agrees that she needs further rehabilitation prior to going home after she leaves the hospital. Patient will benefit from skilled PT to increase their independence and safety with mobility to allow discharge to the venue listed below. Kept pt at a NWB level for RLE during eval/treat and have requested MD to update in chart.      Follow Up Recommendations SNF;Supervision for mobility/OOB    Equipment Recommendations  None recommended by PT    Recommendations for Other Services       Precautions / Restrictions Precautions Precautions: None Restrictions Weight Bearing Restrictions:  (Unsure - have requested MD to update)      Mobility  Bed Mobility Overal bed mobility: Needs Assistance;+2 for physical assistance Bed Mobility: Supine to Sit     Supine to sit: Mod assist;+2 for physical assistance;HOB elevated     General bed mobility comments: Mod assist with HOB elevated. Support for RLE to off of bed and Mod assist for trunk control. Difficult maintaining upright position due to fatigue.  Transfers Overall transfer level: Needs assistance Equipment used: None Transfers: Comptroller transfers: +2 physical assistance;Max assist   General transfer comment: Pt able to assist initially with poster transfer into chair however fatigued quickly and required Max assist +2 to scoot. She required VCs for hand placement and technique and educated on hand/trunk positioning to make transfer  easier.  Ambulation/Gait                Stairs            Wheelchair Mobility    Modified Rankin (Stroke Patients Only)       Balance Overall balance assessment: Needs assistance Sitting-balance support: Single extremity supported Sitting balance-Leahy Scale: Poor Sitting balance - Comments: sitting balance decreases further with fatigue.                                     Pertinent Vitals/Pain Pt reports 0/10 pain Patient repositioned in chair for comfort.     Home Living Family/patient expects to be discharged to:: Private residence Living Arrangements: Alone Available Help at Discharge: Family;Available PRN/intermittently Type of Home: House Home Access: Ramped entrance     Home Layout: One level Home Equipment: Walker - 2 wheels;Shower seat;Bedside commode;Cane - single point;Wheelchair - manual Additional Comments: niece checks in on pt daily; very involved family    Prior Function Level of Independence: Independent with assistive device(s)         Comments: Amb with RW or cane "Which ever is closest." Also has WC, tub bench, BSC     Hand Dominance   Dominant Hand: Right    Extremity/Trunk Assessment   Upper Extremity Assessment: Defer to OT evaluation           Lower Extremity Assessment: RLE deficits/detail;LLE deficits/detail RLE Deficits / Details: Decreased strength and ROM  LLE Deficits / Details: Previous transtibial amputation     Communication   Communication:  No difficulties  Cognition Arousal/Alertness: Awake/alert Behavior During Therapy: WFL for tasks assessed/performed Overall Cognitive Status: Within Functional Limits for tasks assessed                      General Comments General comments (skin integrity, edema, etc.): Pale toes on Right no sensation.    Exercises        Assessment/Plan    PT Assessment Patient needs continued PT services  PT Diagnosis Difficulty walking;Acute  pain;Generalized weakness   PT Problem List Decreased strength;Decreased activity tolerance;Decreased range of motion;Decreased balance;Decreased mobility;Decreased knowledge of use of DME;Impaired sensation;Obesity;Decreased skin integrity  PT Treatment Interventions DME instruction;Gait training;Functional mobility training;Therapeutic activities;Therapeutic exercise;Balance training;Neuromuscular re-education;Patient/family education;Modalities;Wheelchair mobility training   PT Goals (Current goals can be found in the Care Plan section) Acute Rehab PT Goals Patient Stated Goal: Go home PT Goal Formulation: With patient Time For Goal Achievement: 11/07/13 Potential to Achieve Goals: Fair    Frequency Min 3X/week   Barriers to discharge Decreased caregiver support pt lives alone    Co-evaluation               End of Session Equipment Utilized During Treatment: Gait belt Activity Tolerance: Patient limited by fatigue Patient left: in chair;with call bell/phone within reach;Other (comment) (hydrotherapy present for PT session) Nurse Communication: Mobility status         Time: 5625-6389 PT Time Calculation (min): 18 min   Charges:   PT Evaluation $Initial PT Evaluation Tier I: 1 Procedure PT Treatments $Therapeutic Activity: 8-22 mins   PT G Codes:         Elayne Snare, [place]  Ellouise Newer 10/31/2013, 1:01 PM

## 2013-10-31 NOTE — Progress Notes (Signed)
PROGRESS NOTE  [name] HYI:[id number] DOB: Mar 19, 1956 DOA: 10/28/2013 PCP: [name], MD  Brief history  58 year old female with a history of hypertension, diabetes mellitus, stroke, hyperlipidemia presents with two-day history of increasing right foot pain, edema, erythema. She denies any recent injuries or trauma. In emergency department, x-ray of the foot revealed soft tissue gas. There was concern for dressing fasciitis. The patient was emergently taken to surgery by Dr. [name] for irrigation and debridement. Operative findings were not consistent with necrotizing fasciitis. The patient was started on intravenous antibiotics pending culture data.  Cultures did not reveal predominant organism.  Treatment remains empiric.  On the morning of 10/31/2013, ischemic changes were noted to the patient's right great toe and right forefoot area. Vascular surgery was consulted   Assessment/Plan: Necrotic diabetic foot infection  -Appreciate Orthopedics  -s/p emergent I&D 10/28/13--Dr. [name]  -no evidence of necrotizing fasciitis or septic tenosynovitis on operative findings  -Continue intravenous vancomycin, and straining, clindamycin pending culture data  -cultures neg for predominant organism--tx remains empiric -Wound care per orthopedic service  -10/31/13--dusky right great toe and forefoot-->consult vascular--spoke with Dr. [name] -positive probe-to-bone test Fever/Leukocytosis -Am 10/31/13--Temperature 100.44F with WBC increased to 14.4 -UA with urine culture -10/29/13 blood culture negative Diabetes mellitus type 2, uncontrolled with complications  -Continue Lantus-->increased to 24 units  -NovoLog sliding scale  -Adjust insulin pending CBG results   -Hemoglobin A1c  Hypertension  -Fair control  -Continue monitor  -Continue lisinopril and hydralazine  -increase hydralazine to 50mg  q 8hrs Hyperlipidemia  -Continue statin  Tobacco abuse  -Tobacco cessation  discussed Hypokalemia -replete -check mag Family Communication:   Pt at beside Disposition Plan:   Home when medically stable  Antibiotics:  Vancomycin 10/28/13>>>  Aztreonam 10/28/13>>>  Clindamycin 10/28/13>>>      Procedures/Studies: Dg Chest Portable 1 View  10/29/2013   CLINICAL DATA:  Preoperative chest radiograph for right foot surgery.  EXAM: PORTABLE CHEST - 1 VIEW  COMPARISON:  Chest radiograph performed 09/30/2013  FINDINGS: The lungs are well-aerated and clear. There is no evidence of focal opacification, pleural effusion or pneumothorax.  The cardiomediastinal silhouette is within normal limits. No acute osseous abnormalities are seen.  IMPRESSION: No acute cardiopulmonary process seen.   Electronically Signed   By: [name] M.D.   On: 10/29/2013 01:37   Dg Foot Complete Right  10/28/2013   CLINICAL DATA:  Right foot pain and swelling.  EXAM: RIGHT FOOT COMPLETE - 3+ VIEW  COMPARISON:  Right foot radiographs performed 09/15/2013  FINDINGS: There is no evidence of fracture or dislocation. There appears to be chronic disruption of the fifth proximal interphalangeal joint. The joint spaces are preserved. There is no evidence of talar subluxation; the subtalar joint is unremarkable in appearance.  Prominent soft tissue swelling and soft tissue air is noted between the first and second toes, along the plantar surface of the forefoot.  Diffuse vascular calcifications are seen.  IMPRESSION: 1. Prominent soft tissue swelling and soft tissue air noted between the first and second toes, along the plantar surface of the forefoot. This is compatible with gangrene; necrotizing fasciitis cannot be excluded. Would correlate clinically. 2. No evidence of acute fracture or dislocation; no definite osseous erosions identified. 3. Diffuse vascular calcifications seen. 4. 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   Electronically Signed   By: [name] M.D.   On:  10/28/2013 23:08  Subjective: Patient complains of pain on the right foot. Denies any fevers, chills, chest pain, shortness breath, nausea, vomiting, diarrhea, abdominal pain.  Objective: Filed Vitals:   10/30/13 0544 10/30/13 1414 10/30/13 2010 10/31/13 0515  BP: 134/52 168/68 165/67 174/79  Pulse: 80 82 82 88  Temp: 98.4 F (36.9 C) 98.8 F (37.1 C) 98.6 F (37 C) 100.6 F (38.1 C)  TempSrc: Oral  Oral Oral  Resp: 18 18 18 20   Height:      Weight:      SpO2: 100% 98% 98% 99%    Intake/Output Summary (Last 24 hours) at 10/31/13 1040 Last data filed at 10/31/13 9518  Gross per 24 hour  Intake   2575 ml  Output      0 ml  Net   2575 ml   Weight change:  Exam:   General:  Pt is alert, follows commands appropriately, not in acute distress  HEENT: No icterus, No thrush, No neck mass, [place]/AT  Cardiovascular: RRR, S1/S2, no rubs, no gallops  Respiratory: CTA bilaterally, no wheezing, no crackles, no rhonchi  Abdomen: Soft/+BS, non tender, non distended, no guarding  Extremities: L-BKA warm without open wound or erythema;  R-foot with cool toes and dusky hallux and 2nd toe and forefoot.  +probe to bone--no pus or necrosis  Data Reviewed: Basic Metabolic Panel:  Recent Labs Lab 10/28/13 2132 10/30/13 0605 10/31/13 0630  NA 141 142 140  K 3.6* 3.5* 3.3*  CL 99 105 104  CO2 26 25 24   GLUCOSE 175* 161* 95  BUN 13 12 10   CREATININE 0.72 0.80 0.69  CALCIUM 9.1 7.7* 7.7*  MG  --  1.7  --    Liver Function Tests: No results found for this basename: AST, ALT, ALKPHOS, BILITOT, PROT, ALBUMIN,  in the last 168 hours No results found for this basename: LIPASE, AMYLASE,  in the last 168 hours No results found for this basename: AMMONIA,  in the last 168 hours CBC:  Recent Labs Lab 10/28/13 2132 10/30/13 0605 10/31/13 0630  WBC 15.8* 9.8 14.4*  HGB 10.5* 9.0* 9.3*  HCT 32.9* 28.5* 28.5*  MCV 81.8 83.6 80.5  PLT 310 271 310   Cardiac  Enzymes:  Recent Labs Lab 10/30/13 0605  CKTOTAL 132   BNP: No components found with this basename: POCBNP,  CBG:  Recent Labs Lab 10/30/13 0620 10/30/13 1115 10/30/13 1601 10/30/13 2110 10/31/13 0653  GLUCAP 160* 134* 138* 125* 99    Recent Results (from the past 240 hour(s))  CULTURE, BLOOD (ROUTINE X 2)     Status: None   Collection Time    10/29/13 12:45 AM      Result Value Ref Range Status   Specimen Description BLOOD LEFT FOREARM   Final   Special Requests BOTTLES DRAWN AEROBIC AND ANAEROBIC 5CC EACH   Final   Culture  Setup Time     Final   Value: 10/29/2013 12:13     Performed at [facility]   Culture     Final   Value:        BLOOD CULTURE RECEIVED NO GROWTH TO DATE CULTURE WILL BE HELD FOR 5 DAYS BEFORE ISSUING A FINAL NEGATIVE REPORT     Performed at [facility]   Report Status PENDING   Incomplete  WOUND CULTURE     Status: None   Collection Time    10/29/13  2:45 AM      Result Value Ref Range Status  Specimen Description WOUND RIGHT FOOT   Final   Special Requests PT ON CLEOCIN, VANCOMYCIN, AZTREONAM   Final   Gram Stain     Final   Value: NO WBC SEEN     RARE SQUAMOUS EPITHELIAL CELLS PRESENT     RARE GRAM POSITIVE COCCI IN PAIRS     Performed at [facility]   Culture     Final   Value: MULTIPLE ORGANISMS PRESENT, NONE PREDOMINANT NO STAPHYLOCOCCUS AUREUS ISOLATED NO GROUP A STREP (S.PYOGENES) ISOLATED     Performed at [facility]   Report Status 10/31/2013 FINAL   Final  ANAEROBIC CULTURE     Status: None   Collection Time    10/29/13  2:45 AM      Result Value Ref Range Status   Specimen Description WOUND RIGHT FOOT   Final   Special Requests PT ON CLEOCIN, VANCOMYCIN, AZTREONAM   Final   Gram Stain     Final   Value: RARE WBC PRESENT, PREDOMINANTLY PMN     RARE SQUAMOUS EPITHELIAL CELLS PRESENT     NO ORGANISMS SEEN     Performed at [facility]   Culture     Final   Value: NO ANAEROBES  ISOLATED; CULTURE IN PROGRESS FOR 5 DAYS     Performed at [facility]   Report Status PENDING   Incomplete  MRSA PCR SCREENING     Status: None   Collection Time    10/29/13  3:15 AM      Result Value Ref Range Status   MRSA by PCR NEGATIVE  NEGATIVE Final   Comment:            The GeneXpert MRSA Assay (FDA     approved for NASAL specimens     only), is one component of a     comprehensive MRSA colonization     surveillance program. It is not     intended to diagnose MRSA     infection nor to guide or     monitor treatment for     MRSA infections.  CULTURE, BLOOD (ROUTINE X 2)     Status: None   Collection Time    10/29/13  4:00 AM      Result Value Ref Range Status   Specimen Description BLOOD RIGHT HAND   Final   Special Requests BOTTLES DRAWN AEROBIC ONLY 5CC   Final   Culture  Setup Time     Final   Value: 10/29/2013 12:13     Performed at [facility]   Culture     Final   Value:        BLOOD CULTURE RECEIVED NO GROWTH TO DATE CULTURE WILL BE HELD FOR 5 DAYS BEFORE ISSUING A FINAL NEGATIVE REPORT     Performed at [facility]   Report Status PENDING   Incomplete     Scheduled Meds: . aspirin  81 mg Oral Daily  . atorvastatin  40 mg Oral q1800  . aztreonam  2 g Intravenous 3 times per day  . clindamycin (CLEOCIN) IV  600 mg Intravenous 3 times per day  . enoxaparin (LOVENOX) injection  40 mg Subcutaneous Q24H  . hydrALAZINE  50 mg Oral 3 times per day  . insulin aspart  0-15 Units Subcutaneous TID WC  . insulin glargine  24 Units Subcutaneous QHS  . lisinopril  20 mg Oral Daily  . vancomycin  1,000 mg Intravenous Once  .  vancomycin  1,000 mg Intravenous Q12H   Continuous Infusions: . sodium chloride 125 mL/hr (10/29/13 2147)     Rekia Kujala, DO  [facility] Pager [phone]  If 7PM-7AM, please contact night-coverage [email or web address] Password [email or web address] 10/31/2013, 10:40 AM   LOS: 3 days

## 2013-10-31 NOTE — Progress Notes (Addendum)
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 to toes 1 and 2 (Active)  Dressing Type ABD;Compression wrap;Gauze (Comment) 10/31/2013 10:09 AM  Dressing Changed Changed 10/31/2013 10:09 AM  Dressing Status Clean;Dry;Intact 10/31/2013 10:09 AM  Dressing Change Frequency Daily 10/31/2013 10:09 AM  Site / Wound Assessment Pink;Pale;Yellow;Other (Comment);Black;Dry 10/31/2013 10:09 AM  % Wound base Red or Granulating 10% 10/31/2013 10:09 AM  % Wound base Yellow 30% 10/31/2013 10:09 AM  % Wound base Black 20% 10/31/2013 10:09 AM  % Wound base Other (Comment) 40% 10/31/2013 10:09 AM  Peri-wound Assessment Intact;Erythema (non-blanchable) 10/31/2013 10:09 AM  Wound Length (cm) 7 cm 10/29/2013  4:01 PM  Wound Width (cm) 4.6 cm 10/29/2013  4:01 PM  Wound Depth (cm) 1.5 cm 10/29/2013  4:01 PM  Margins Unattached edges (unapproximated) 10/31/2013 10:09 AM  Closure None 10/31/2013 10:09 AM  Drainage Amount Scant 10/31/2013 10:09 AM  Drainage Description Serous 10/31/2013 10:09 AM  Non-staged Wound Description Full thickness 10/31/2013 10:09 AM  Treatment Debridement (Selective);Hydrotherapy (Pulse lavage);Packing (Saline gauze) 10/31/2013 10:09 AM     Incision (Closed) 10/29/13 Foot Right (Active)  Dressing Type ABD;Compression wrap;Gauze (Comment) 10/31/2013 10:09 AM  Dressing Changed 10/31/2013 10:09 AM  Dressing Change Frequency  Daily 10/31/2013 10:09 AM  Site / Wound Assessment Dry;Black;Yellow 10/31/2013 10:09 AM  Incision Length (cm) 7 cm 10/29/2013  3:00 PM  Margins Unattached edges (unapproximated) 10/31/2013 10:09 AM  Closure None 10/31/2013 10:09 AM  Drainage Amount Scant 10/31/2013 10:09 AM  Drainage Description Serous 10/31/2013 10:09 AM  Treatment Other (Comment) 10/31/2013 10:09 AM   Hydrotherapy Pulsed lavage therapy - wound location: Rt foot surgical wound Pulsed Lavage with Suction (psi): 8 psi Pulsed Lavage with Suction - Normal Saline Used: 1000 mL Pulsed Lavage Tip: Tip with splash shield Selective Debridement Selective Debridement - Location: dorsum and plantar surface wound L foot Selective Debridement - Tools Used: Forceps;Scissors Selective Debridement - Tissue Removed: necrotic adipose and tissue, slough   Wound Assessment and Plan  Wound Therapy - Assess/Plan/Recommendations Wound Therapy - Clinical Statement: Wound appears to have worsened with dry black necrotic tissue present Wound Therapy - Functional Problem List: This wound will limit pts weightbearing and rehab Factors Delaying/Impairing Wound Healing: Diabetes Mellitus;Infection - systemic/local;Vascular compromise;Altered sensation;Immobility Hydrotherapy Plan: Debridement;Dressing change;Patient/family education;Pulsatile lavage with suction Wound Therapy - Frequency: 6X / week Wound Therapy - Follow Up Recommendations: Skilled nursing facility Wound Plan: Continue PLS, debridement and dressing changes to assist improvement in the character of the wound.  Wound Therapy Goals- Improve the function of patient's integumentary system by progressing the wound(s) through the phases of wound healing (inflammation - proliferation - remodeling) by: Decrease Necrotic Tissue to: 30 Decrease Necrotic Tissue - Progress: Not progressing Increase Granulation Tissue to: 70 (including any viable structures incl tendon, capsule) Increase Granulation  Tissue - Progress: Mot progressing Improve Drainage Characteristics: Serous;Min Improve Drainage Characteristics - Progress: Not progressing Wound Therapy - Potential for Goals: Fair  Goals will be updated until maximal  potential achieved or discharge criteria met.  Discharge criteria: when goals achieved, discharge from hospital, MD decision/surgical intervention, no progress towards goals, refusal/missing three consecutive treatments without notification or medical reason.  GP     Lulia Schriner 10/31/2013, 10:20 AM Pager [phone]

## 2013-11-01 DIAGNOSIS — R7309 Other abnormal glucose: Secondary | ICD-10-CM

## 2013-11-01 LAB — BASIC METABOLIC PANEL
BUN: 7 mg/dL (ref 6–23)
CHLORIDE: 102 meq/L (ref 96–112)
CO2: 25 mEq/L (ref 19–32)
Calcium: 7.5 mg/dL — ABNORMAL LOW (ref 8.4–10.5)
Creatinine, Ser: 0.66 mg/dL (ref 0.50–1.10)
GFR calc Af Amer: >90 mL/min (ref >90)
GFR calc non Af Amer: >90 mL/min (ref >90)
Glucose, Bld: 110 mg/dL — ABNORMAL HIGH (ref 70–99)
Potassium: 3.1 mEq/L — ABNORMAL LOW (ref 3.7–5.3)
Sodium: 140 mEq/L (ref 137–147)

## 2013-11-01 LAB — GLUCOSE, CAPILLARY
GLUCOSE-CAPILLARY: 103 mg/dL — AB (ref 70–99)
GLUCOSE-CAPILLARY: 160 mg/dL — AB (ref 70–99)
Glucose-Capillary: 134 mg/dL — ABNORMAL HIGH (ref 70–99)
Glucose-Capillary: 157 mg/dL — ABNORMAL HIGH (ref 70–99)

## 2013-11-01 LAB — CBC
HEMATOCRIT: 26.3 % — AB (ref 36.0–46.0)
Hemoglobin: 8.6 g/dL — ABNORMAL LOW (ref 12.0–15.0)
MCH: 26.1 pg (ref 26.0–34.0)
MCHC: 32.7 g/dL (ref 30.0–36.0)
MCV: 79.7 fL (ref 78.0–100.0)
Platelets: 307 10*3/uL (ref 150–400)
RBC: 3.3 MIL/uL — ABNORMAL LOW (ref 3.87–5.11)
RDW: 14.2 % (ref 11.5–15.5)
WBC: 13.3 10*3/uL — AB (ref 4.0–10.5)

## 2013-11-01 LAB — VANCOMYCIN, TROUGH: Vancomycin Tr: 15.7 ug/mL (ref 10.0–20.0)

## 2013-11-01 MED ORDER — POTASSIUM CHLORIDE 10 MEQ/100ML IV SOLN
10.0000 meq | Freq: Once | INTRAVENOUS | Status: AC
  Administered 2013-11-01: 10 meq via INTRAVENOUS
  Filled 2013-11-01: qty 100

## 2013-11-01 MED ORDER — POTASSIUM CHLORIDE CRYS ER 20 MEQ PO TBCR
40.0000 meq | EXTENDED_RELEASE_TABLET | Freq: Once | ORAL | Status: AC
  Administered 2013-11-01: 40 meq via ORAL
  Filled 2013-11-01: qty 2

## 2013-11-01 NOTE — Progress Notes (Signed)
Patient ID: [name], female   DOB: [birth year], 59 y.o.   MRN: [id number] I spoke with Dr. early and patient is not revascularization candidate. With gangrenous changes involving the midfoot patient does not have any foot salvage options. We'll plan for a transtibial amputation Wednesday at noon. Anticipate patient will need skilled nursing facility at discharge.

## 2013-11-01 NOTE — Progress Notes (Signed)
ANTIBIOTIC CONSULT NOTE - FOLLOW UP  Pharmacy Consult for vancomycin/aztreonam Indication: Gangrenous foot wound, poss necrotizing fascitis   Allergies  Allergen Reactions  . Banana Swelling and Other (See Comments)    Facial swelling  . Penicillins Itching, Swelling and Rash    Face swells and break out  . Strawberry Swelling and Other (See Comments)    Facial swelling    Patient Measurements: Height: 5\' 6"  (167.6 cm) Weight: 174 lb 6.1 oz (79.1 kg) IBW/kg (Calculated) : 59.3   Vital Signs: Temp: 97.4 F (36.3 C) (06/16 1316) Temp src: Oral (06/16 0531) BP: 149/68 mmHg (06/16 1316) Pulse Rate: 90 (06/16 1316) Intake/Output from previous day: 06/15 0701 - 06/16 0700 In: 360 [P.O.:360] Out: -  Intake/Output from this shift: Total I/O In: 250 [P.O.:250] Out: -   Labs:  Recent Labs  10/30/13 0605 10/31/13 0630 11/01/13 0625  WBC 9.8 14.4* 13.3*  HGB 9.0* 9.3* 8.6*  PLT 271 310 307  CREATININE 0.80 0.69 0.66   Estimated Creatinine Clearance: 80.3 ml/min (by C-G formula based on Cr of 0.66).  Recent Labs  11/01/13 1120  VANCOTROUGH 15.7     Microbiology: Recent Results (from the past 720 hour(s))  CULTURE, BLOOD (ROUTINE X 2)     Status: None   Collection Time    10/29/13 12:45 AM      Result Value Ref Range Status   Specimen Description BLOOD LEFT FOREARM   Final   Special Requests BOTTLES DRAWN AEROBIC AND ANAEROBIC 5CC EACH   Final   Culture  Setup Time     Final   Value: 10/29/2013 12:13     Performed at [facility]   Culture     Final   Value:        BLOOD CULTURE RECEIVED NO GROWTH TO DATE CULTURE WILL BE HELD FOR 5 DAYS BEFORE ISSUING A FINAL NEGATIVE REPORT     Performed at [facility]   Report Status PENDING   Incomplete  WOUND CULTURE     Status: None   Collection Time    10/29/13  2:45 AM      Result Value Ref Range Status   Specimen Description WOUND RIGHT FOOT   Final   Special Requests PT ON CLEOCIN, VANCOMYCIN,  AZTREONAM   Final   Gram Stain     Final   Value: NO WBC SEEN     RARE SQUAMOUS EPITHELIAL CELLS PRESENT     RARE GRAM POSITIVE COCCI IN PAIRS     Performed at [facility]   Culture     Final   Value: MULTIPLE ORGANISMS PRESENT, NONE PREDOMINANT NO STAPHYLOCOCCUS AUREUS ISOLATED NO GROUP A STREP (S.PYOGENES) ISOLATED     Performed at [facility]   Report Status 10/31/2013 FINAL   Final  ANAEROBIC CULTURE     Status: None   Collection Time    10/29/13  2:45 AM      Result Value Ref Range Status   Specimen Description WOUND RIGHT FOOT   Final   Special Requests PT ON CLEOCIN, VANCOMYCIN, AZTREONAM   Final   Gram Stain     Final   Value: RARE WBC PRESENT, PREDOMINANTLY PMN     RARE SQUAMOUS EPITHELIAL CELLS PRESENT     NO ORGANISMS SEEN     Performed at [facility]   Culture     Final   Value: NO ANAEROBES ISOLATED; CULTURE IN PROGRESS FOR 5 DAYS  Performed at [facility]   Report Status PENDING   Incomplete  MRSA PCR SCREENING     Status: None   Collection Time    10/29/13  3:15 AM      Result Value Ref Range Status   MRSA by PCR NEGATIVE  NEGATIVE Final   Comment:            The GeneXpert MRSA Assay (FDA     approved for NASAL specimens     only), is one component of a     comprehensive MRSA colonization     surveillance program. It is not     intended to diagnose MRSA     infection nor to guide or     monitor treatment for     MRSA infections.  CULTURE, BLOOD (ROUTINE X 2)     Status: None   Collection Time    10/29/13  4:00 AM      Result Value Ref Range Status   Specimen Description BLOOD RIGHT HAND   Final   Special Requests BOTTLES DRAWN AEROBIC ONLY 5CC   Final   Culture  Setup Time     Final   Value: 10/29/2013 12:13     Performed at [facility]   Culture     Final   Value:        BLOOD CULTURE RECEIVED NO GROWTH TO DATE CULTURE WILL BE HELD FOR 5 DAYS BEFORE ISSUING A FINAL NEGATIVE REPORT     Performed at  [facility]   Report Status PENDING   Incomplete    Anti-infectives   Start     Dose/Rate Route Frequency Ordered Stop   10/29/13 1200  vancomycin (VANCOCIN) IVPB 1000 mg/200 mL premix     1,000 mg 200 mL/hr over 60 Minutes Intravenous Every 12 hours 10/28/13 2350     10/29/13 0000  aztreonam (AZACTAM) 2 g in dextrose 5 % 50 mL IVPB     2 g 100 mL/hr over 30 Minutes Intravenous 3 times per day 10/28/13 2349     10/28/13 2345  clindamycin (CLEOCIN) IVPB 600 mg     600 mg 100 mL/hr over 30 Minutes Intravenous 3 times per day 10/28/13 2337     10/28/13 2330  vancomycin (VANCOCIN) IVPB 1000 mg/200 mL premix     1,000 mg 200 mL/hr over 60 Minutes Intravenous  Once 10/28/13 2324        Assessment: Patient is a 58 y.o F on vancomycin, aztreonam, and clindamycin for necrotic diabetic foot infection with plan for transtibial amputation on 6/17.  Vancomycin level now back therapeutic at 15.7 today.  Scr has been stable.   6/12 Vancomycin>> 6/12 Aztreonam>> 6/12 Clindamycin>>  6/13 blood>> ngtd 6/13 wound (right foot)>> multiple bacteria (no staph, no group A strep   Goal of Therapy:  Vancomycin trough level 15-20 mcg/ml  Plan:  1) continue vancomycin 1gm IV q12h 2) no change for aztreonam  Pham, Anh P 11/01/2013,1:18 PM

## 2013-11-01 NOTE — Progress Notes (Signed)
Clinical Social Work Department BRIEF PSYCHOSOCIAL ASSESSMENT 11/01/2013  Patient:  [name], [name]     Account Number:  [id number]     Admit date:  10/28/2013  Clinical Social Worker:  Freeman Caldron  Date/Time:  11/01/2013 01:42 PM  Referred by:  Physician  Date Referred:  11/01/2013 Referred for  SNF Placement   Other Referral:   Interview type:  Patient Other interview type:    PSYCHOSOCIAL DATA Living Status:  ALONE Admitted from facility:   Level of care:   Primary support name:  Arturo Morton ([phone]) Primary support relationship to patient:  FAMILY Degree of support available:   Good--pt has support from family (niece, aunt, sister).    CURRENT CONCERNS Current Concerns  Post-Acute Placement   Other Concerns:    SOCIAL WORK ASSESSMENT / PLAN CSW explained recommendation for SNF and that because pt has no insurance, pt would be eligible for a letter of guarantee from the hospital. CSW answered pt's questions about SNF search and explained placement is dependent on a facility being able to accept this method of payment. Pt understanding. Will provide bed offers.   Assessment/plan status:  Psychosocial Support/Ongoing Assessment of Needs Other assessment/ plan:   Information/referral to community resources:   SNF    PATIENT'S/FAMILY'S RESPONSE TO PLAN OF CARE: Good--pt engaged in conversation with CSW and understands CSW role.       Ky Barban, MSW, [facility] Clinical Social Worker [phone]

## 2013-11-01 NOTE — Progress Notes (Addendum)
PROGRESS NOTE  [name] GQQ:[id number] DOB: 13-Jan-1956 DOA: 10/28/2013 PCP: [name], MD  Assessment/Plan: Brief history  58 year old female with a history of hypertension, diabetes mellitus, stroke, hyperlipidemia presents with two-day history of increasing right foot pain, edema, erythema. She denies any recent injuries or trauma. In emergency department, x-ray of the foot revealed soft tissue gas. There was concern for dressing fasciitis. The patient was emergently taken to surgery by Dr. [name] for irrigation and debridement. Operative findings were not consistent with necrotizing fasciitis. The patient was started on intravenous antibiotics pending culture data. Cultures did not reveal predominant organism. Treatment remains empiric. On the morning of 10/31/2013, ischemic changes were noted to the patient's right great toe and right forefoot area. Vascular surgery was consulted and did not feel that the patient was a revascularization candidate. As a result, right BKA is planned for 11/02/2013. Assessment/Plan:  Necrotic diabetic foot infection/Gangrene of Foot -Appreciate Orthopedics--spoke with Dr. [name] -s/p emergent I&D 10/28/13--Dr. [name]  -no evidence of necrotizing fasciitis or septic tenosynovitis on operative findings  -Continue intravenous vancomycin, and straining, clindamycin -cultures neg for predominant organism--tx remains empiric  -Wound care per orthopedic service  -10/31/13--dusky right great toe and forefoot-->consult vascular--spoke with Dr. [name]  -positive probe-to-bone test  -not a revascularization candidate -Plan for R-BKA on 11/02/2013 -Can stop IV antibiotics 24 hours after BKA if stable Fever/Leukocytosis  -Am 10/31/13--Temperature 100.49F with WBC increased to 14.4-->13.3 -likely due to R-foot ischemia/infection  -UA--no pyuria -10/29/13 blood culture negative  Diabetes mellitus type 2, uncontrolled with complications  -Continue  Lantus-->increased to 24 units  -CBGs controlled -NovoLog sliding scale  -Adjust insulin pending CBG results  -Hemoglobin A1c--7.3  Hypertension  -elevated BP partly due to pain -Continue monitor  -Continue lisinopril and hydralazine  -increased hydralazine to 50mg  q 8hrs  Hyperlipidemia  -Continue statin  Tobacco abuse  -Tobacco cessation discussed  Hypokalemia  -replete  -check mag  Family Communication: Pt at beside  Disposition Plan: SNF when medically stable  Antibiotics:  Vancomycin 10/28/13>>>  Aztreonam 10/28/13>>>  Clindamycin 10/28/13>>>          Procedures/Studies: Dg Chest Portable 1 View  10/29/2013   CLINICAL DATA:  Preoperative chest radiograph for right foot surgery.  EXAM: PORTABLE CHEST - 1 VIEW  COMPARISON:  Chest radiograph performed 09/30/2013  FINDINGS: The lungs are well-aerated and clear. There is no evidence of focal opacification, pleural effusion or pneumothorax.  The cardiomediastinal silhouette is within normal limits. No acute osseous abnormalities are seen.  IMPRESSION: No acute cardiopulmonary process seen.   Electronically Signed   By: [name] M.D.   On: 10/29/2013 01:37   Dg Foot Complete Right  10/28/2013   CLINICAL DATA:  Right foot pain and swelling.  EXAM: RIGHT FOOT COMPLETE - 3+ VIEW  COMPARISON:  Right foot radiographs performed 09/15/2013  FINDINGS: There is no evidence of fracture or dislocation. There appears to be chronic disruption of the fifth proximal interphalangeal joint. The joint spaces are preserved. There is no evidence of talar subluxation; the subtalar joint is unremarkable in appearance.  Prominent soft tissue swelling and soft tissue air is noted between the first and second toes, along the plantar surface of the forefoot.  Diffuse vascular calcifications are seen.  IMPRESSION: 1. Prominent soft tissue swelling and soft tissue air noted between the first and second toes, along the plantar surface of the forefoot.  This is compatible with gangrene; necrotizing fasciitis cannot be  excluded. Would correlate clinically. 2. No evidence of acute fracture or dislocation; no definite osseous erosions identified. 3. Diffuse vascular calcifications seen. 4. 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   Electronically Signed   By: [name] M.D.   On: 10/28/2013 23:08         Subjective: Patient states the pain is low but better than yesterday.  Patient denies fevers, chills, headache, chest pain, dyspnea, nausea, vomiting, diarrhea, abdominal pain, dysuria, hematuria   Objective: Filed Vitals:   10/31/13 2038 11/01/13 0055 11/01/13 0531 11/01/13 1316  BP: 168/73  166/64 149/68  Pulse: 89  78 90  Temp: 100.6 F (38.1 C) 100.1 F (37.8 C) 98.9 F (37.2 C) 97.4 F (36.3 C)  TempSrc: Oral Oral Oral   Resp: 18  18   Height:      Weight:      SpO2: 100%  99% 100%    Intake/Output Summary (Last 24 hours) at 11/01/13 1627 Last data filed at 11/01/13 0900  Gross per 24 hour  Intake    250 ml  Output      0 ml  Net    250 ml   Weight change:  Exam:   General:  Pt is alert, follows commands appropriately, not in acute distress  HEENT: No icterus, No thrush,  [place]/AT  Cardiovascular: RRR, S1/S2, no rubs, no gallops  Respiratory: CTA bilaterally, no wheezing, no crackles, no rhonchi  Abdomen: Soft/+BS, non tender, non distended, no guarding  Extremities: Right foot is to touch with dusky changes in the forefoot. No crepitance. No odor.  Data Reviewed: Basic Metabolic Panel:  Recent Labs Lab 10/28/13 2132 10/30/13 0605 10/31/13 0630 11/01/13 0625  NA 141 142 140 140  K 3.6* 3.5* 3.3* 3.1*  CL 99 105 104 102  CO2 26 25 24 25   GLUCOSE 175* 161* 95 110*  BUN 13 12 10 7   CREATININE 0.72 0.80 0.69 0.66  CALCIUM 9.1 7.7* 7.7* 7.5*  MG  --  1.7  --   --    Liver Function Tests: No results found for this basename: AST, ALT, ALKPHOS, BILITOT, PROT, ALBUMIN,   in the last 168 hours No results found for this basename: LIPASE, AMYLASE,  in the last 168 hours No results found for this basename: AMMONIA,  in the last 168 hours CBC:  Recent Labs Lab 10/28/13 2132 10/30/13 0605 10/31/13 0630 11/01/13 0625  WBC 15.8* 9.8 14.4* 13.3*  HGB 10.5* 9.0* 9.3* 8.6*  HCT 32.9* 28.5* 28.5* 26.3*  MCV 81.8 83.6 80.5 79.7  PLT 310 271 310 307   Cardiac Enzymes:  Recent Labs Lab 10/30/13 0605  CKTOTAL 132   BNP: No components found with this basename: POCBNP,  CBG:  Recent Labs Lab 10/31/13 1608 10/31/13 2138 11/01/13 0650 11/01/13 1117 11/01/13 1612  GLUCAP 132* 148* 103* 134* 157*    Recent Results (from the past 240 hour(s))  CULTURE, BLOOD (ROUTINE X 2)     Status: None   Collection Time    10/29/13 12:45 AM      Result Value Ref Range Status   Specimen Description BLOOD LEFT FOREARM   Final   Special Requests BOTTLES DRAWN AEROBIC AND ANAEROBIC [facility] EACH   Final   Culture  Setup Time     Final   Value: 10/29/2013 12:13     Performed at [facility]   Culture     Final   Value:  BLOOD CULTURE RECEIVED NO GROWTH TO DATE CULTURE WILL BE HELD FOR 5 DAYS BEFORE ISSUING A FINAL NEGATIVE REPORT     Performed at [facility]   Report Status PENDING   Incomplete  WOUND CULTURE     Status: None   Collection Time    10/29/13  2:45 AM      Result Value Ref Range Status   Specimen Description WOUND RIGHT FOOT   Final   Special Requests PT ON CLEOCIN, VANCOMYCIN, AZTREONAM   Final   Gram Stain     Final   Value: NO WBC SEEN     RARE SQUAMOUS EPITHELIAL CELLS PRESENT     RARE GRAM POSITIVE COCCI IN PAIRS     Performed at [facility]   Culture     Final   Value: MULTIPLE ORGANISMS PRESENT, NONE PREDOMINANT NO STAPHYLOCOCCUS AUREUS ISOLATED NO GROUP A STREP (S.PYOGENES) ISOLATED     Performed at [facility]   Report Status 10/31/2013 FINAL   Final  ANAEROBIC CULTURE     Status: None   Collection  Time    10/29/13  2:45 AM      Result Value Ref Range Status   Specimen Description WOUND RIGHT FOOT   Final   Special Requests PT ON CLEOCIN, VANCOMYCIN, AZTREONAM   Final   Gram Stain     Final   Value: RARE WBC PRESENT, PREDOMINANTLY PMN     RARE SQUAMOUS EPITHELIAL CELLS PRESENT     NO ORGANISMS SEEN     Performed at [facility]   Culture     Final   Value: NO ANAEROBES ISOLATED; CULTURE IN PROGRESS FOR 5 DAYS     Performed at [facility]   Report Status PENDING   Incomplete  MRSA PCR SCREENING     Status: None   Collection Time    10/29/13  3:15 AM      Result Value Ref Range Status   MRSA by PCR NEGATIVE  NEGATIVE Final   Comment:            The GeneXpert MRSA Assay (FDA     approved for NASAL specimens     only), is one component of a     comprehensive MRSA colonization     surveillance program. It is not     intended to diagnose MRSA     infection nor to guide or     monitor treatment for     MRSA infections.  CULTURE, BLOOD (ROUTINE X 2)     Status: None   Collection Time    10/29/13  4:00 AM      Result Value Ref Range Status   Specimen Description BLOOD RIGHT HAND   Final   Special Requests BOTTLES DRAWN AEROBIC ONLY 5CC   Final   Culture  Setup Time     Final   Value: 10/29/2013 12:13     Performed at [facility]   Culture     Final   Value:        BLOOD CULTURE RECEIVED NO GROWTH TO DATE CULTURE WILL BE HELD FOR 5 DAYS BEFORE ISSUING A FINAL NEGATIVE REPORT     Performed at [facility]   Report Status PENDING   Incomplete     Scheduled Meds: . aspirin  81 mg Oral Daily  . atorvastatin  40 mg Oral q1800  . aztreonam  2 g Intravenous 3 times per day  .  clindamycin (CLEOCIN) IV  600 mg Intravenous 3 times per day  . enoxaparin (LOVENOX) injection  40 mg Subcutaneous Q24H  . hydrALAZINE  50 mg Oral 3 times per day  . insulin aspart  0-15 Units Subcutaneous TID WC  . insulin glargine  24 Units Subcutaneous QHS  .  lisinopril  20 mg Oral Daily  . vancomycin  1,000 mg Intravenous Once  . vancomycin  1,000 mg Intravenous Q12H   Continuous Infusions: . sodium chloride 125 mL/hr at 11/01/13 0511     TAT, DAVID, DO  [facility] Pager [phone]  If 7PM-7AM, please contact night-coverage [email or web address] Password [email or web address] 11/01/2013, 4:27 PM   LOS: 4 days

## 2013-11-01 NOTE — Progress Notes (Signed)
Hydrotherapy Discharge Note  Patient Details Name: [name] MRN: [id number] DOB: 21-Mar-1956   Patient is being discharged from hydrotherapy services secondary to:   Surgery planned for 11/02/13--Rt BKA, further aggressive wound care not indicated.   Please see latest Hydrotherapy Progress Note for current wound status and progress toward goals.  Progress and discharge plan and discussed with patient/caregiver and they:  Patient unable to participate in discharge planning     SASSER,LYNN 11/01/2013, 10:20 AM Pager [phone]

## 2013-11-02 ENCOUNTER — Encounter (HOSPITAL_COMMUNITY)
Admission: EM | Disposition: A | Discharge status: Skilled Nursing Facility | Payer: Self-pay | Source: Home / Self Care | Attending: Internal Medicine

## 2013-11-02 ENCOUNTER — Inpatient Hospital Stay (HOSPITAL_COMMUNITY): Payer: Medicaid Other | Admitting: Anesthesiology

## 2013-11-02 ENCOUNTER — Encounter (HOSPITAL_COMMUNITY): Payer: Medicaid Other | Admitting: Anesthesiology

## 2013-11-02 ENCOUNTER — Encounter (HOSPITAL_COMMUNITY): Payer: Self-pay | Admitting: Anesthesiology

## 2013-11-02 HISTORY — PX: AMPUTATION: SHX166

## 2013-11-02 LAB — URINE CULTURE
COLONY COUNT: NO GROWTH
Culture: NO GROWTH

## 2013-11-02 LAB — BASIC METABOLIC PANEL
BUN: 7 mg/dL (ref 6–23)
CO2: 23 mEq/L (ref 19–32)
CREATININE: 0.62 mg/dL (ref 0.50–1.10)
Calcium: 7.6 mg/dL — ABNORMAL LOW (ref 8.4–10.5)
Chloride: 100 mEq/L (ref 96–112)
GFR calc non Af Amer: >90 mL/min (ref >90)
Glucose, Bld: 130 mg/dL — ABNORMAL HIGH (ref 70–99)
Potassium: 3.5 mEq/L — ABNORMAL LOW (ref 3.7–5.3)
Sodium: 139 mEq/L (ref 137–147)

## 2013-11-02 LAB — CBC
HCT: 26.4 % — ABNORMAL LOW (ref 36.0–46.0)
Hemoglobin: 8.7 g/dL — ABNORMAL LOW (ref 12.0–15.0)
MCH: 26.1 pg (ref 26.0–34.0)
MCHC: 33 g/dL (ref 30.0–36.0)
MCV: 79.3 fL (ref 78.0–100.0)
Platelets: 311 10*3/uL (ref 150–400)
RBC: 3.33 MIL/uL — ABNORMAL LOW (ref 3.87–5.11)
RDW: 14.4 % (ref 11.5–15.5)
WBC: 14.2 10*3/uL — AB (ref 4.0–10.5)

## 2013-11-02 LAB — GLUCOSE, CAPILLARY
GLUCOSE-CAPILLARY: 97 mg/dL (ref 70–99)
Glucose-Capillary: 121 mg/dL — ABNORMAL HIGH (ref 70–99)
Glucose-Capillary: 92 mg/dL (ref 70–99)
Glucose-Capillary: 108 mg/dL — ABNORMAL HIGH (ref 70–99)
Glucose-Capillary: 92 mg/dL (ref 70–99)

## 2013-11-02 LAB — MAGNESIUM: MAGNESIUM: 1.6 mg/dL (ref 1.5–2.5)

## 2013-11-02 SURGERY — AMPUTATION BELOW KNEE
Anesthesia: General | Site: Leg Lower | Laterality: Right

## 2013-11-02 MED ORDER — FENTANYL CITRATE 0.05 MG/ML IJ SOLN
INTRAMUSCULAR | Status: DC | PRN
  Administered 2013-11-02: 50 ug via INTRAVENOUS
  Administered 2013-11-02 (×2): 100 ug via INTRAVENOUS

## 2013-11-02 MED ORDER — ONDANSETRON HCL 4 MG/2ML IJ SOLN: 4.0000 mg | Freq: Four times a day (QID) | INTRAMUSCULAR | Status: DC | PRN

## 2013-11-02 MED ORDER — OXYCODONE HCL 5 MG PO TABS
5.0000 mg | ORAL_TABLET | Freq: Once | ORAL | Status: AC | PRN
  Administered 2013-11-02: 5 mg via ORAL

## 2013-11-02 MED ORDER — ONDANSETRON HCL 4 MG/2ML IJ SOLN
INTRAMUSCULAR | Status: DC | PRN
  Administered 2013-11-02: 4 mg via INTRAVENOUS

## 2013-11-02 MED ORDER — DOCUSATE SODIUM 100 MG PO CAPS
100.0000 mg | ORAL_CAPSULE | Freq: Two times a day (BID) | ORAL | Status: DC
  Administered 2013-11-03 – 2013-11-04 (×3): 100 mg via ORAL
  Filled 2013-11-02 (×4): qty 1

## 2013-11-02 MED ORDER — LIDOCAINE HCL (CARDIAC) 20 MG/ML IV SOLN
INTRAVENOUS | Status: DC | PRN
  Administered 2013-11-02: 40 mg via INTRAVENOUS

## 2013-11-02 MED ORDER — MIDAZOLAM HCL 5 MG/5ML IJ SOLN
INTRAMUSCULAR | Status: DC | PRN
  Administered 2013-11-02: 2 mg via INTRAVENOUS

## 2013-11-02 MED ORDER — PROPOFOL 10 MG/ML IV BOLUS
INTRAVENOUS | Status: DC | PRN
  Administered 2013-11-02: 160 mg via INTRAVENOUS

## 2013-11-02 MED ORDER — 0.9 % SODIUM CHLORIDE (POUR BTL) OPTIME
TOPICAL | Status: DC | PRN
  Administered 2013-11-02: 1000 mL

## 2013-11-02 MED ORDER — PROPOFOL 10 MG/ML IV BOLUS
INTRAVENOUS | Status: AC
  Filled 2013-11-02: qty 20

## 2013-11-02 MED ORDER — CHLORHEXIDINE GLUCONATE 4 % EX LIQD
60.0000 mL | Freq: Once | CUTANEOUS | Status: DC
  Filled 2013-11-02: qty 60

## 2013-11-02 MED ORDER — OXYCODONE HCL 5 MG/5ML PO SOLN: 5.0000 mg | Freq: Once | ORAL | Status: AC | PRN

## 2013-11-02 MED ORDER — FENTANYL CITRATE 0.05 MG/ML IJ SOLN
INTRAMUSCULAR | Status: AC
  Filled 2013-11-02: qty 5

## 2013-11-02 MED ORDER — HYDROMORPHONE HCL PF 1 MG/ML IJ SOLN
0.2500 mg | INTRAMUSCULAR | Status: DC | PRN
  Administered 2013-11-02 (×2): 0.5 mg via INTRAVENOUS

## 2013-11-02 MED ORDER — METHOCARBAMOL 500 MG PO TABS
500.0000 mg | ORAL_TABLET | Freq: Four times a day (QID) | ORAL | Status: DC | PRN
  Administered 2013-11-03 – 2013-11-04 (×2): 500 mg via ORAL
  Filled 2013-11-02 (×2): qty 1

## 2013-11-02 MED ORDER — OXYCODONE-ACETAMINOPHEN 5-325 MG PO TABS
1.0000 | ORAL_TABLET | ORAL | Status: DC | PRN
  Administered 2013-11-03 – 2013-11-04 (×3): 1 via ORAL
  Filled 2013-11-02 (×3): qty 1

## 2013-11-02 MED ORDER — HYDROMORPHONE HCL PF 1 MG/ML IJ SOLN
0.5000 mg | INTRAMUSCULAR | Status: DC | PRN
  Administered 2013-11-02 – 2013-11-03 (×2): 1 mg via INTRAVENOUS
  Filled 2013-11-02 (×2): qty 1

## 2013-11-02 MED ORDER — METOCLOPRAMIDE HCL 5 MG PO TABS
5.0000 mg | ORAL_TABLET | Freq: Three times a day (TID) | ORAL | Status: DC | PRN
  Filled 2013-11-02: qty 2

## 2013-11-02 MED ORDER — METOCLOPRAMIDE HCL 5 MG/ML IJ SOLN: 5.0000 mg | Freq: Three times a day (TID) | INTRAMUSCULAR | Status: DC | PRN

## 2013-11-02 MED ORDER — POTASSIUM CHLORIDE CRYS ER 20 MEQ PO TBCR: 40.0000 meq | EXTENDED_RELEASE_TABLET | ORAL | Status: DC | PRN

## 2013-11-02 MED ORDER — METOCLOPRAMIDE HCL 5 MG/ML IJ SOLN: 10.0000 mg | Freq: Once | INTRAMUSCULAR | Status: DC | PRN

## 2013-11-02 MED ORDER — LACTATED RINGERS IV SOLN
INTRAVENOUS | Status: DC
  Administered 2013-11-02: 11:00:00 via INTRAVENOUS

## 2013-11-02 MED ORDER — ONDANSETRON HCL 4 MG PO TABS: 4.0000 mg | ORAL_TABLET | Freq: Four times a day (QID) | ORAL | Status: DC | PRN

## 2013-11-02 MED ORDER — HYDROMORPHONE HCL PF 1 MG/ML IJ SOLN
INTRAMUSCULAR | Status: AC
Start: 2013-11-02 — End: 2013-11-03
  Filled 2013-11-02: qty 1

## 2013-11-02 MED ORDER — SODIUM CHLORIDE 0.9 % IV SOLN: INTRAVENOUS | Status: DC

## 2013-11-02 MED ORDER — DEXTROSE 5 % IV SOLN
500.0000 mg | Freq: Four times a day (QID) | INTRAVENOUS | Status: DC | PRN
  Filled 2013-11-02: qty 5

## 2013-11-02 MED ORDER — OXYCODONE HCL 5 MG PO TABS
ORAL_TABLET | ORAL | Status: AC
  Filled 2013-11-02: qty 1

## 2013-11-02 MED ORDER — MIDAZOLAM HCL 2 MG/2ML IJ SOLN
INTRAMUSCULAR | Status: AC
  Filled 2013-11-02: qty 2

## 2013-11-02 MED ORDER — MAGNESIUM SULFATE 40 MG/ML IJ SOLN
2.0000 g | Freq: Once | INTRAMUSCULAR | Status: DC
  Filled 2013-11-02: qty 50

## 2013-11-02 SURGICAL SUPPLY — 50 items
BANDAGE ESMARK 6X9 LF (GAUZE/BANDAGES/DRESSINGS) ×1 IMPLANT
BANDAGE GAUZE ELAST BULKY 4 IN (GAUZE/BANDAGES/DRESSINGS) ×6 IMPLANT
BLADE SAW RECIP 87.9 MT (BLADE) ×3 IMPLANT
BLADE SURG 21 STRL SS (BLADE) ×3 IMPLANT
BNDG COHESIVE 6X5 TAN STRL LF (GAUZE/BANDAGES/DRESSINGS) ×3 IMPLANT
BNDG ESMARK 6X9 LF (GAUZE/BANDAGES/DRESSINGS) ×3
COVER SURGICAL LIGHT HANDLE (MISCELLANEOUS) ×3 IMPLANT
CUFF TOURNIQUET SINGLE 34IN LL (TOURNIQUET CUFF) IMPLANT
CUFF TOURNIQUET SINGLE 44IN (TOURNIQUET CUFF) IMPLANT
DRAIN PENROSE 1/2X12 LTX STRL (WOUND CARE) IMPLANT
DRAPE EXTREMITY T 121X128X90 (DRAPE) ×3 IMPLANT
DRAPE PROXIMA HALF (DRAPES) ×6 IMPLANT
DRAPE U-SHAPE 47X51 STRL (DRAPES) ×6 IMPLANT
DRSG ADAPTIC 3X8 NADH LF (GAUZE/BANDAGES/DRESSINGS) ×3 IMPLANT
DRSG PAD ABDOMINAL 8X10 ST (GAUZE/BANDAGES/DRESSINGS) ×3 IMPLANT
DURAPREP 26ML APPLICATOR (WOUND CARE) ×3 IMPLANT
ELECT REM PT RETURN 9FT ADLT (ELECTROSURGICAL) ×3
ELECTRODE REM PT RTRN 9FT ADLT (ELECTROSURGICAL) ×1 IMPLANT
GLOVE BIOGEL PI IND STRL 6.5 (GLOVE) ×1 IMPLANT
GLOVE BIOGEL PI IND STRL 7.0 (GLOVE) ×1 IMPLANT
GLOVE BIOGEL PI IND STRL 9 (GLOVE) ×1 IMPLANT
GLOVE BIOGEL PI INDICATOR 6.5 (GLOVE) ×2
GLOVE BIOGEL PI INDICATOR 7.0 (GLOVE) ×2
GLOVE BIOGEL PI INDICATOR 9 (GLOVE) ×2
GLOVE SURG ORTHO 9.0 STRL STRW (GLOVE) ×3 IMPLANT
GLOVE SURG SS PI 6.5 STRL IVOR (GLOVE) ×3 IMPLANT
GOWN STRL REUS W/ TWL LRG LVL3 (GOWN DISPOSABLE) ×1 IMPLANT
GOWN STRL REUS W/ TWL XL LVL3 (GOWN DISPOSABLE) ×2 IMPLANT
GOWN STRL REUS W/TWL LRG LVL3 (GOWN DISPOSABLE) ×2
GOWN STRL REUS W/TWL XL LVL3 (GOWN DISPOSABLE) ×4
KIT BASIN OR (CUSTOM PROCEDURE TRAY) ×3 IMPLANT
KIT ROOM TURNOVER OR (KITS) ×3 IMPLANT
MANIFOLD NEPTUNE II (INSTRUMENTS) ×3 IMPLANT
NS IRRIG 1000ML POUR BTL (IV SOLUTION) ×3 IMPLANT
PACK GENERAL/GYN (CUSTOM PROCEDURE TRAY) ×3 IMPLANT
PAD ABD 8X10 STRL (GAUZE/BANDAGES/DRESSINGS) ×6 IMPLANT
PAD ARMBOARD 7.5X6 YLW CONV (MISCELLANEOUS) ×6 IMPLANT
SPONGE GAUZE 4X4 12PLY (GAUZE/BANDAGES/DRESSINGS) ×3 IMPLANT
SPONGE GAUZE 4X4 12PLY STER LF (GAUZE/BANDAGES/DRESSINGS) ×3 IMPLANT
SPONGE LAP 18X18 X RAY DECT (DISPOSABLE) IMPLANT
STAPLER VISISTAT 35W (STAPLE) ×3 IMPLANT
STOCKINETTE IMPERVIOUS LG (DRAPES) ×3 IMPLANT
SUT PDS AB 1 CT  36 (SUTURE) ×4
SUT PDS AB 1 CT 36 (SUTURE) ×2 IMPLANT
SUT SILK 2 0 (SUTURE) ×2
SUT SILK 2-0 18XBRD TIE 12 (SUTURE) ×1 IMPLANT
TOWEL OR 17X24 6PK STRL BLUE (TOWEL DISPOSABLE) ×3 IMPLANT
TOWEL OR 17X26 10 PK STRL BLUE (TOWEL DISPOSABLE) ×3 IMPLANT
TUBE ANAEROBIC SPECIMEN COL (MISCELLANEOUS) IMPLANT
WATER STERILE IRR 1000ML POUR (IV SOLUTION) ×3 IMPLANT

## 2013-11-02 NOTE — Op Note (Signed)
10/28/2013 - 11/02/2013  12:48 PM  PATIENT:  [name]    PRE-OPERATIVE DIAGNOSIS:  Gangrene Right Foot  POST-OPERATIVE DIAGNOSIS:  Same  PROCEDURE:  AMPUTATION BELOW KNEE  SURGEON:  Newt Minion, MD  PHYSICIAN ASSISTANT:None ANESTHESIA:   General  PREOPERATIVE INDICATIONS:  [name] is a  58 y.o. female with a diagnosis of Gangrene Right Foot who failed conservative measures and elected for surgical management.    The risks benefits and alternatives were discussed with the patient preoperatively including but not limited to the risks of infection, bleeding, nerve injury, cardiopulmonary complications, the need for revision surgery, among others, and the patient was willing to proceed.  OPERATIVE IMPLANTS: None  OPERATIVE FINDINGS: Good healthy tissue with severe peripheral vascular disease at the incision site  OPERATIVE PROCEDURE: Patient was brought to the operating room and underwent a general anesthetic. After adequate levels of anesthesia were obtained patient's right lower extremity was prepped using DuraPrep draped in a sterile field. A timeout was called. The tourniquet was inflated at the thigh. A transverse incision was made 11 cm distal to the tibial tubercle this curved proximally and a large posterior flap was created. The tibia was transected proximal to the skin incision double anteriorly and the fibula was transected just proximal to the tibial incision. A large posterior flap was created. The sciatic nerve was pulled cut and allowed to retract. The vascular bundles were suture ligated with 2-0 silk. Tourniquet was deflated hemostasis was obtained. The deep and superficial fascial layers were closed using #1 PDS. The skin was closed using staples. The wound was covered with Adaptic orthopedic sponges AB dressing Kerlix and Coban. Patient was extubated taken to the PACU in stable condition. Plan for discharge to skilled nursing.

## 2013-11-02 NOTE — Interval H&P Note (Signed)
History and Physical Interval Note:  11/02/2013 6:23 AM  [name]  has presented today for surgery, with the diagnosis of Gangrene Right Foot  The various methods of treatment have been discussed with the patient and family. After consideration of risks, benefits and other options for treatment, the patient has consented to  Procedure(s) with comments: AMPUTATION BELOW KNEE (Right) - Right Below Knee Amputation as a surgical intervention .  The patient's history has been reviewed, patient examined, no change in status, stable for surgery.  I have reviewed the patient's chart and labs.  Questions were answered to the patient's satisfaction.     DUDA,MARCUS V

## 2013-11-02 NOTE — Progress Notes (Signed)
PT Cancellation Note  Patient Details Name: [name] MRN: [id number] DOB: [birth year]   Cancelled Treatment:    Reason Eval/Treat Not Completed: Patient at procedure or test/unavailable. Patient at surgery for BKA. Will follow up tomorrow as appropriate   Jacqualyn Posey 11/02/2013, 7:29 AM

## 2013-11-02 NOTE — Transfer of Care (Signed)
Immediate Anesthesia Transfer of Care Note  Patient: [name]  Procedure(s) Performed: Procedure(s) with comments: AMPUTATION BELOW KNEE (Right) - Right Below Knee Amputation  Patient Location: PACU  Anesthesia Type:General  Level of Consciousness: awake, alert  and oriented  Airway & Oxygen Therapy: Patient Spontanous Breathing and Patient connected to nasal cannula oxygen  Post-op Assessment: Report given to PACU RN and Post -op Vital signs reviewed and stable  Post vital signs: Reviewed and stable  Complications: No apparent anesthesia complications

## 2013-11-02 NOTE — Anesthesia Procedure Notes (Signed)
Procedure Name: LMA Insertion Date/Time: 11/02/2013 12:07 PM Performed by: Manuela Schwartz B Pre-anesthesia Checklist: Patient identified, Emergency Drugs available, Suction available, Patient being monitored and Timeout performed Patient Re-evaluated:Patient Re-evaluated prior to inductionOxygen Delivery Method: Circle system utilized Preoxygenation: Pre-oxygenation with 100% oxygen Intubation Type: IV induction LMA: LMA with gastric port inserted LMA Size: 4.0 Number of attempts: 1 Placement Confirmation: positive ETCO2 and breath sounds checked- equal and bilateral Tube secured with: Tape Dental Injury: Teeth and Oropharynx as per pre-operative assessment

## 2013-11-02 NOTE — Progress Notes (Signed)
[facility] Progress Note   MERIDIAN SCHERGER XIP:[id number] DOB: November 23, 1955 DOA: 10/28/2013 PCP: [name], MD  Brief narrative: [name] is a 58 y.o. female   with a history of hypertension, diabetes mellitus, stroke, hyperlipidemia presents with two-day history of increasing right foot pain, edema, erythema. She denies any recent injuries or trauma. In emergency department, x-ray of the foot revealed soft tissue gas. There was concern for dressing fasciitis. The patient was emergently taken to surgery by Dr. [name] for irrigation and debridement. Operative findings were not consistent with necrotizing fasciitis. The patient was started on intravenous antibiotics pending culture data. Cultures did not reveal predominant organism. Treatment remains empiric. On the morning of 10/31/2013, ischemic changes were noted to the patient's right great toe and right forefoot area. Vascular surgery was consulted and did not feel that the patient was a revascularization candidate. As a result, right BKA is planned for 11/02/2013.    Subjective: AAO x 3. Evaluated prior to surgery- no c/o pain. BP noted to be high but she states it often runs this high at home. Sugars she state have recently been well controlled. No diarrhea, constipation, N or Vomiting.   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 [name]  -no evidence of necrotizing fasciitis or septic tenosynovitis on operative findings  -Continue intravenous vancomycin, and straining, clindamycin  -cultures neg for predominant organism--tx remains empiric  -Wound care per orthopedic service  -10/31/13--dusky right great toe and forefoot-->consult vascular--spoke with Dr. 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  -Can stop IV antibiotics 24 hours after BKA if stable   Fever/Leukocytosis   -Am 10/31/13--Temperature 100.57F with WBC increased to 14.4-->13.3  -likely due to R-foot ischemia/infection  -UA--no pyuria  -10/29/13 blood culture negative   Diabetes mellitus type 2, uncontrolled with complications  -Continue Lantus-->increased to 24 units  -CBGs controlled  -NovoLog sliding scale  -Adjust insulin pending CBG results  -Hemoglobin A1c--7.3   Hypertension  -elevated BP partly due to pain but patient states BP is often this high -Continue monitor  -Continue lisinopril and hydralazine  -increased hydralazine to 50mg  q 8hrs   Hyperlipidemia  -Continue statin   Tobacco abuse  -Tobacco cessation discussed    Hypokalemia  -replete again today - mag low norma- replaced as well  Family Communication: Pt at beside  Disposition Plan: SNF when medically stable  Antibiotics:  Vancomycin 10/28/13>>>  Aztreonam 10/28/13>>>  Clindamycin 10/28/13>>>  Antibiotics: Anti-infectives   Start     Dose/Rate Route Frequency Ordered Stop   10/29/13 1200  vancomycin (VANCOCIN) IVPB 1000 mg/200 mL premix     1,000 mg 200 mL/hr over 60 Minutes Intravenous Every 12 hours 10/28/13 2350     10/29/13 0000  aztreonam (AZACTAM) 2 g in dextrose 5 % 50 mL IVPB     2 g 100 mL/hr over 30 Minutes Intravenous 3 times per day 10/28/13 2349     10/28/13 2345  clindamycin (CLEOCIN) IVPB 600 mg     600 mg 100 mL/hr over 30 Minutes Intravenous 3 times per day 10/28/13 2337     10/28/13 2330  vancomycin (VANCOCIN) IVPB 1000 mg/200 mL premix     1,000 mg 200 mL/hr over 60 Minutes Intravenous  Once 10/28/13 2324         DVT prophylaxis: Lovenox  Objective: Filed Weights   10/29/13 0305  Weight: 79.1 kg (174 lb 6.1  oz)    Vitals Filed Vitals:   11/02/13 1400 11/02/13 1415 11/02/13 1430 11/02/13 1525  BP: 165/73 179/71 173/72 168/77  Pulse: 96 96 93 95  Temp:   98.7 F (37.1 C) 98.7 F (37.1 C)  TempSrc:      Resp: 20 20 14    Height:      Weight:      SpO2:   100% 100%      Intake/Output Summary (Last 24 hours) at 11/02/13 0859 Last data filed at 11/01/13 0900  Gross per 24 hour  Intake    250 ml  Output      0 ml  Net    250 ml     Exam: General: No acute respiratory distress- AAO x 3 Lungs: Clear to auscultation bilaterally without wheezes or crackles Cardiovascular: Regular rate and rhythm without murmur gallop or rub normal S1 and S2 Abdomen: Nontender, nondistended, soft, bowel sounds positive, no rebound, no ascites, no appreciable mass Extremities: No significant cyanosis, clubbing, or edema bilateral lower extremities- 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  Data Reviewed: Basic Metabolic Panel:  Recent Labs Lab 10/28/13 2132 10/30/13 0605 10/31/13 0630 11/01/13 0625 11/02/13 0535  NA 141 142 140 140 139  K 3.6* 3.5* 3.3* 3.1* 3.5*  CL 99 105 104 102 100  CO2 26 25 24 25 23   GLUCOSE 175* 161* 95 110* 130*  BUN 13 12 10 7 7   CREATININE 0.72 0.80 0.69 0.66 0.62  CALCIUM 9.1 7.7* 7.7* 7.5* 7.6*  MG  --  1.7  --   --  1.6   Liver Function Tests: No results found for this basename: AST, ALT, ALKPHOS, BILITOT, PROT, ALBUMIN,  in the last 168 hours No results found for this basename: LIPASE, AMYLASE,  in the last 168 hours No results found for this basename: AMMONIA,  in the last 168 hours CBC:  Recent Labs Lab 10/28/13 2132 10/30/13 0605 10/31/13 0630 11/01/13 0625 11/02/13 0535  WBC 15.8* 9.8 14.4* 13.3* 14.2*  HGB 10.5* 9.0* 9.3* 8.6* 8.7*  HCT 32.9* 28.5* 28.5* 26.3* 26.4*  MCV 81.8 83.6 80.5 79.7 79.3  PLT 310 271 310 307 311   Cardiac Enzymes:  Recent Labs Lab 10/30/13 0605  CKTOTAL 132   BNP (last 3 results) No results found for this basename: PROBNP,  in the last 8760 hours CBG:  Recent Labs Lab 11/01/13 0650 11/01/13 1117 11/01/13 1612 11/01/13 2149 11/02/13 0639  GLUCAP 103* 134* 157* 160* 121*    Recent Results (from the past 240 hour(s))  CULTURE, BLOOD (ROUTINE X 2)     Status: None    Collection Time    10/29/13 12:45 AM      Result Value Ref Range Status   Specimen Description BLOOD LEFT FOREARM   Final   Special Requests BOTTLES DRAWN AEROBIC AND ANAEROBIC 5CC EACH   Final   Culture  Setup Time     Final   Value: 10/29/2013 12:13     Performed at [facility]   Culture     Final   Value:        BLOOD CULTURE RECEIVED NO GROWTH TO DATE CULTURE WILL BE HELD FOR 5 DAYS BEFORE ISSUING A FINAL NEGATIVE REPORT     Performed at [facility]   Report Status PENDING   Incomplete  WOUND CULTURE     Status: None   Collection Time    10/29/13  2:45 AM  Result Value Ref Range Status   Specimen Description WOUND RIGHT FOOT   Final   Special Requests PT ON CLEOCIN, VANCOMYCIN, AZTREONAM   Final   Gram Stain     Final   Value: NO WBC SEEN     RARE SQUAMOUS EPITHELIAL CELLS PRESENT     RARE GRAM POSITIVE COCCI IN PAIRS     Performed at [facility]   Culture     Final   Value: MULTIPLE ORGANISMS PRESENT, NONE PREDOMINANT NO STAPHYLOCOCCUS AUREUS ISOLATED NO GROUP A STREP (S.PYOGENES) ISOLATED     Performed at [facility]   Report Status 10/31/2013 FINAL   Final  ANAEROBIC CULTURE     Status: None   Collection Time    10/29/13  2:45 AM      Result Value Ref Range Status   Specimen Description WOUND RIGHT FOOT   Final   Special Requests PT ON CLEOCIN, VANCOMYCIN, AZTREONAM   Final   Gram Stain     Final   Value: RARE WBC PRESENT, PREDOMINANTLY PMN     RARE SQUAMOUS EPITHELIAL CELLS PRESENT     NO ORGANISMS SEEN     Performed at [facility]   Culture     Final   Value: NO ANAEROBES ISOLATED; CULTURE IN PROGRESS FOR 5 DAYS     Performed at [facility]   Report Status PENDING   Incomplete  MRSA PCR SCREENING     Status: None   Collection Time    10/29/13  3:15 AM      Result Value Ref Range Status   MRSA by PCR NEGATIVE  NEGATIVE Final   Comment:            The GeneXpert MRSA Assay (FDA     approved for NASAL  specimens     only), is one component of a     comprehensive MRSA colonization     surveillance program. It is not     intended to diagnose MRSA     infection nor to guide or     monitor treatment for     MRSA infections.  CULTURE, BLOOD (ROUTINE X 2)     Status: None   Collection Time    10/29/13  4:00 AM      Result Value Ref Range Status   Specimen Description BLOOD RIGHT HAND   Final   Special Requests BOTTLES DRAWN AEROBIC ONLY 5CC   Final   Culture  Setup Time     Final   Value: 10/29/2013 12:13     Performed at [facility]   Culture     Final   Value:        BLOOD CULTURE RECEIVED NO GROWTH TO DATE CULTURE WILL BE HELD FOR 5 DAYS BEFORE ISSUING A FINAL NEGATIVE REPORT     Performed at [facility]   Report Status PENDING   Incomplete  URINE CULTURE     Status: None   Collection Time    10/31/13  6:29 PM      Result Value Ref Range Status   Specimen Description URINE, RANDOM   Final   Special Requests NONE   Final   Culture  Setup Time     Final   Value: 11/01/2013 00:19     Performed at [facility]     Final   Value: NO GROWTH     Performed at [facility]  Culture     Final   Value: NO GROWTH     Performed at [facility]   Report Status 11/02/2013 FINAL   Final     Studies:  Recent x-ray studies have been reviewed in detail by the Attending Physician  Scheduled Meds:  Scheduled Meds: . aspirin  81 mg Oral Daily  . atorvastatin  40 mg Oral q1800  . aztreonam  2 g Intravenous 3 times per day  . chlorhexidine  60 mL Topical Once  . clindamycin (CLEOCIN) IV  600 mg Intravenous 3 times per day  . enoxaparin (LOVENOX) injection  40 mg Subcutaneous Q24H  . hydrALAZINE  50 mg Oral 3 times per day  . insulin aspart  0-15 Units Subcutaneous TID WC  . insulin glargine  24 Units Subcutaneous QHS  . lisinopril  20 mg Oral Daily  . magnesium sulfate 1 - 4 g bolus IVPB  2 g Intravenous Once  . vancomycin   1,000 mg Intravenous Once  . vancomycin  1,000 mg Intravenous Q12H   Continuous Infusions: . sodium chloride 125 mL/hr at 11/01/13 0511    Time spent on care of this patient: > 35 min   [place], MD 11/02/2013, 8:59 AM  LOS: 5 days   [facility] Office  [phone] Pager - Text Page per Shea Evans   If 7PM-7AM, please contact night-coverage [email or web address]

## 2013-11-02 NOTE — H&P (View-Only) (Signed)
Patient ID: KEAH LAMBA, female   DOB: 1956-03-18, 58 y.o.   MRN: [id number] I spoke with Dr. early and patient is not revascularization candidate. With gangrenous changes involving the midfoot patient does not have any foot salvage options. We'll plan for a transtibial amputation Wednesday at noon. Anticipate patient will need skilled nursing facility at discharge.

## 2013-11-02 NOTE — Progress Notes (Signed)
Spoke with pt's aunt--explained pt has two bed offers from SNF but CIR is also looking at pt for eligibility. CSW following and will assist with discharge planning when pt is medically ready for discharge.   Ky Barban, MSW, [facility] Clinical Social Worker [phone]

## 2013-11-02 NOTE — Anesthesia Postprocedure Evaluation (Signed)
Anesthesia Post Note  Patient: [name]  Procedure(s) Performed: Procedure(s) (LRB): AMPUTATION BELOW KNEE (Right)  Anesthesia type: General  Patient location: PACU  Post pain: Pain level controlled  Post assessment: Patient's Cardiovascular Status Stable  Last Vitals:  Filed Vitals:   11/02/13 1345  BP:   Pulse: 95  Temp:   Resp: 20    Post vital signs: Reviewed and stable  Level of consciousness: alert  Complications: No apparent anesthesia complications

## 2013-11-02 NOTE — Anesthesia Preprocedure Evaluation (Signed)
Anesthesia Evaluation  Patient identified by MRN, date of birth, ID band Patient awake    Reviewed: Allergy & Precautions, H&P , NPO status , Patient's Chart, lab work & pertinent test results, reviewed documented beta blocker date and time   Airway Mallampati: II TM Distance: >3 FB Neck ROM: full    Dental   Pulmonary Current Smoker,  breath sounds clear to auscultation        Cardiovascular hypertension, On Medications + CAD Rhythm:regular     Neuro/Psych TIA Neuromuscular disease CVA negative psych ROS   GI/Hepatic Neg liver ROS, GERD-  Medicated and Controlled,  Endo/Other  negative endocrine ROSdiabetes, Insulin Dependent  Renal/GU negative Renal ROS  negative genitourinary   Musculoskeletal   Abdominal   Peds  Hematology  (+) anemia ,   Anesthesia Other Findings See surgeon's H&P   Reproductive/Obstetrics negative OB ROS                           Anesthesia Physical Anesthesia Plan  ASA: III  Anesthesia Plan: General   Post-op Pain Management:    Induction: Intravenous  Airway Management Planned: LMA  Additional Equipment:   Intra-op Plan:   Post-operative Plan:   Informed Consent: I have reviewed the patients History and Physical, chart, labs and discussed the procedure including the risks, benefits and alternatives for the proposed anesthesia with the patient or authorized representative who has indicated his/her understanding and acceptance.   Dental Advisory Given  Plan Discussed with: CRNA and Surgeon  Anesthesia Plan Comments:         Anesthesia Quick Evaluation

## 2013-11-03 DIAGNOSIS — L98499 Non-pressure chronic ulcer of skin of other sites with unspecified severity: Secondary | ICD-10-CM

## 2013-11-03 DIAGNOSIS — I739 Peripheral vascular disease, unspecified: Secondary | ICD-10-CM

## 2013-11-03 DIAGNOSIS — S88119A Complete traumatic amputation at level between knee and ankle, unspecified lower leg, initial encounter: Secondary | ICD-10-CM

## 2013-11-03 DIAGNOSIS — M86179 Other acute osteomyelitis, unspecified ankle and foot: Secondary | ICD-10-CM

## 2013-11-03 LAB — BASIC METABOLIC PANEL
BUN: 6 mg/dL (ref 6–23)
CALCIUM: 8 mg/dL — AB (ref 8.4–10.5)
CO2: 28 meq/L (ref 19–32)
CREATININE: 0.58 mg/dL (ref 0.50–1.10)
Chloride: 97 mEq/L (ref 96–112)
GFR calc Af Amer: >90 mL/min (ref >90)
Glucose, Bld: 115 mg/dL — ABNORMAL HIGH (ref 70–99)
Potassium: 3.6 mEq/L — ABNORMAL LOW (ref 3.7–5.3)
Sodium: 136 mEq/L — ABNORMAL LOW (ref 137–147)

## 2013-11-03 LAB — ANAEROBIC CULTURE

## 2013-11-03 LAB — GLUCOSE, CAPILLARY
GLUCOSE-CAPILLARY: 151 mg/dL — AB (ref 70–99)
Glucose-Capillary: 118 mg/dL — ABNORMAL HIGH (ref 70–99)
Glucose-Capillary: 143 mg/dL — ABNORMAL HIGH (ref 70–99)
Glucose-Capillary: 143 mg/dL — ABNORMAL HIGH (ref 70–99)

## 2013-11-03 LAB — CBC
HCT: 25.1 % — ABNORMAL LOW (ref 36.0–46.0)
Hemoglobin: 8.2 g/dL — ABNORMAL LOW (ref 12.0–15.0)
MCH: 25.8 pg — AB (ref 26.0–34.0)
MCHC: 32.7 g/dL (ref 30.0–36.0)
MCV: 78.9 fL (ref 78.0–100.0)
PLATELETS: 317 10*3/uL (ref 150–400)
RBC: 3.18 MIL/uL — ABNORMAL LOW (ref 3.87–5.11)
RDW: 14.4 % (ref 11.5–15.5)
WBC: 10.1 10*3/uL (ref 4.0–10.5)

## 2013-11-03 MED ORDER — POTASSIUM CHLORIDE CRYS ER 20 MEQ PO TBCR
40.0000 meq | EXTENDED_RELEASE_TABLET | ORAL | Status: AC
  Administered 2013-11-03 (×2): 40 meq via ORAL
  Filled 2013-11-03 (×2): qty 2

## 2013-11-03 MED ORDER — POLYETHYLENE GLYCOL 3350 17 G PO PACK
17.0000 g | PACK | Freq: Every day | ORAL | Status: DC | PRN
  Administered 2013-11-03: 17 g via ORAL
  Filled 2013-11-03: qty 1

## 2013-11-03 NOTE — Evaluation (Signed)
Physical Therapy Reevaluation Patient Details Name: [name] MRN: [id number] DOB: January 25, 1956 Today's Date: 11/03/2013   History of Present Illness  58 y.o. female s/p right foot I&D. Significant Hx of Lt transtibial amputation, HTN, and CVA..  Now s/p  R BKA  Clinical Impression  Pt admitted with/for R foot ?necrotizing fascitis.  Unable to save the foot, s/p R transtibial amputation.  Pt currently limited functionally due to the problems listed. ( See problems list.)   Pt will benefit from PT to maximize function and safety in order to get ready for next venue listed below.     Follow Up Recommendations CIR    Equipment Recommendations  None recommended by PT    Recommendations for Other Services Rehab consult     Precautions / Restrictions Precautions Precautions: Fall      Mobility  Bed Mobility Overal bed mobility: Needs Assistance;+2 for physical assistance Bed Mobility: Supine to Sit     Supine to sit: Mod assist        Transfers Overall transfer level: Needs assistance Equipment used: None Transfers: Comptroller transfers: +2 physical assistance;Max assist   General transfer comment: pt has difficulty positioning her UE's to assist with scoot or a/p transfers.  Maximal assist  with +2 assist needed.  Ambulation/Gait                Stairs            Wheelchair Mobility    Modified Rankin (Stroke Patients Only)       Balance Overall balance assessment: Needs assistance Sitting-balance support: Bilateral upper extremity supported Sitting balance-Leahy Scale: Poor Sitting balance - Comments: pt unable to maintain balance in long sitting even with Bil UE's Postural control: Posterior lean                                   Pertinent Vitals/Pain     Home Living Family/patient expects to be discharged to:: Private residence Living Arrangements: Alone Available Help at  Discharge: Family;Available PRN/intermittently Type of Home: House Home Access: Ramped entrance     Home Layout: One level Home Equipment: Walker - 2 wheels;Shower seat;Bedside commode;Cane - single point;Wheelchair - manual Additional Comments: niece checks in on pt daily; very involved family    Prior Function Level of Independence: Independent with assistive device(s)         Comments: Amb with RW or cane "Which ever is closest." Also has WC, tub bench, BSC.  Has prosthesis for L LE     Hand Dominance   Dominant Hand: Right    Extremity/Trunk Assessment               Lower Extremity Assessment: Generalized weakness RLE Deficits / Details: Decreased strength and ROM        Communication   Communication: No difficulties  Cognition Arousal/Alertness: Awake/alert Behavior During Therapy: WFL for tasks assessed/performed Overall Cognitive Status: Within Functional Limits for tasks assessed                      General Comments      Exercises Other Exercises Other Exercises: AAROM in hip/ knee flexion/ext x10, hip abd x10      Assessment/Plan    PT Assessment Patient needs continued PT services  PT Diagnosis Acute pain;Generalized weakness   PT Problem List Decreased strength;Decreased range of motion;Decreased  balance;Decreased mobility;Decreased knowledge of use of DME;Decreased knowledge of precautions;Pain  PT Treatment Interventions Functional mobility training;DME instruction;Therapeutic activities;Therapeutic exercise;Balance training;Patient/family education   PT Goals (Current goals can be found in the Care Plan section) Acute Rehab PT Goals Patient Stated Goal: Get home eventually PT Goal Formulation: With patient Time For Goal Achievement: 11/17/13 Potential to Achieve Goals: Fair    Frequency Min 3X/week   Barriers to discharge Decreased caregiver support      Co-evaluation               End of Session   Activity  Tolerance: Patient limited by fatigue Patient left: in chair;with call bell/phone within reach Nurse Communication: Mobility status         Time: 1583-0940 PT Time Calculation (min): 23 min   Charges:   PT Evaluation $PT Re-evaluation: 1 Procedure PT Treatments $Therapeutic Activity: 8-22 mins   PT G Codes:          [place], Tessie Fass 11/03/2013, 4:20 PM 11/03/2013  Donnella Sham, [place] [phone]  (pager)

## 2013-11-03 NOTE — Consult Note (Signed)
Physical Medicine and Rehabilitation Consult  Reason for Consult: R-BKA in patient with L-BKA and prior l-CVA Referring Physician:  Dr. Wynelle Cleveland.    HPI: [name] is a 58 y.o. female with history of CVA, HTN, L-BKA, necrotic diabetic foot ulcer with right foot swelling and pain who was admitted on 10/29/13 for workup. X rays of foot with gas c/w gangrene and patient taken to OR for I & D right foot by Dr. [name]. Placed on IV antibiotics as well as hydrotherapy in attempts at wound salvage. Patient not a revascularization candidate and underwent R-BKA on 11/02/13 by Dr. [name]. Antibiotics to be discontinued at discharge per Dr. [name]. Post op pain control improved and therapy initiated. CIR recommended by MD and CM.   Was using left below-knee prosthesis prior to admission. Nice notes the patient has had some weight loss and questions whether the prosthesis is fitting properly Patient's pain is fairly well-controlled at did receive some IV pain medication today No phantom limb pain  Review of Systems  HENT: Negative for hearing loss.   Eyes: Negative for blurred vision and double vision.  Respiratory: Negative for cough and shortness of breath.   Cardiovascular: Negative for chest pain and palpitations.  Gastrointestinal: Negative for heartburn, nausea and constipation.  Genitourinary: Negative for urgency and frequency.  Musculoskeletal: Positive for joint pain. Negative for myalgias.  Neurological: Negative for dizziness, tingling, focal weakness and headaches.  Psychiatric/Behavioral: The patient is not nervous/anxious and does not have insomnia.      Past Medical History  Diagnosis Date  . Atypical chest pain     a. non obstructive by cath in 2008 and 2010;  b. 02/2012 Myoview: non-ischemic, EF 57%  . HTN (hypertension)   . Hyperlipidemia   . Syncope and collapse     a. near-syncopal episode in November 2008;  b. 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  . CVA  (cerebral infarction)     a.  Small right parietal noted incidentally 04/2007;  b. right sided embolic CVA 06/7739;  c. 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 Peripheral neuropathy   . Obesity, unspecified   . Esophageal reflux   . Diabetes mellitus, type II   . Diaphragmatic hernia without mention of obstruction or gangrene   . Irritable bowel syndrome   . Morbid obesity   . Cellulitis     a. 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 Stroke     2013-'no residual'  . Vertigo   . Dry skin   . Constipation   . Tobacco abuse     Past Surgical History  Procedure Laterality Date  . Invasive electrophysiologic study  5/09    followed by insertion of an implantable loop recorder. S/p removal   . Colonoscopy    . Cholecystectomy    . Multiple toe surgeries    . Cardiac catheterization      LAD 30%, circumflex 50%, OM 75%, RI 60% with small branch 80%, dominant RCA 60%, EF 45-50%  . I&d extremity  12/23/2011    Procedure: IRRIGATION AND DEBRIDEMENT EXTREMITY;  Surgeon: Augustin Schooling, MD;  Location: [place];  Service: Orthopedics;  Laterality: Left;  Left Foot  . I&d extremity  12/26/2011    Procedure: IRRIGATION AND DEBRIDEMENT EXTREMITY;  Surgeon: Wylene Simmer, MD;  Location: [place];  Service: Orthopedics;  Laterality: Left;  LEFT FOOT I&D WITH POSSIBLE WOUND VAC APPLICATION, POSSIBLE LEFT FIRST RAY AMPUTATION  . Amputation  12/30/2011    Procedure: AMPUTATION RAY;  Surgeon: Wylene Simmer, MD;  Location: [facility];  Service: Orthopedics;  Laterality: Left;  LEFT FIRST RAY AMPUTATION  . Eye surgery      cataract  . Amputation  01/13/2012    Procedure: AMPUTATION BELOW KNEE;  Surgeon: Wylene Simmer, MD;  Location: [facility];  Service: Orthopedics;  Laterality: Left;  . Amputation  03/04/2012    Procedure: AMPUTATION BELOW KNEE;  Surgeon: Wylene Simmer, MD;  Location: [place];  Service: Orthopedics;  Laterality: Left;   Revision of Left Below Knee Amputation  . Tee without cardioversion N/A 07/07/2012    Procedure: TRANSESOPHAGEAL ECHOCARDIOGRAM (TEE);  Surgeon: Lelon Perla, MD;  Location: [facility] ENDOSCOPY;  Service: Cardiovascular;  Laterality: N/A;  . I&d extremity Right 10/29/2013    Procedure: CWCBJSEGBT AND DEBRIDEMENT EXTREMITY;  Surgeon: Johnn Hai, MD;  Location: [place];  Service: Orthopedics;  Laterality: Right;    Family History  Problem Relation Age of Onset  . Pneumonia Mother   . Gallbladder disease Mother     cancer  . Heart failure Mother   . Diabetes Father   . Coronary artery disease Father   . Stroke Neg Hx     Social History:  Lives alone. Has family who can check in daily.  She reports that she was smoking  3-4 Cigarettes till couple of weeks PTA.  She has a 10 pack-year smoking history. She has never used smokeless tobacco.  She reports that she drinks about 2.4 ounces of alcohol per week. She reports that she does not use illicit drugs.   Allergies  Allergen Reactions  . Banana Swelling and Other (See Comments)    Facial swelling  . Penicillins Itching, Swelling and Rash    Face swells and break out  . Strawberry Swelling and Other (See Comments)    Facial swelling    Medications Prior to Admission  Medication Sig Dispense Refill  . glimepiride (AMARYL) 4 MG tablet Take 4 mg by mouth daily with breakfast.      . hydrALAZINE (APRESOLINE) 25 MG tablet Take 25 mg by mouth 4 (four) times daily.      . hydrochlorothiazide (HYDRODIURIL) 25 MG tablet Take 25 mg by mouth every morning.      Marland Kitchen ibuprofen (ADVIL,MOTRIN) 800 MG tablet Take 800 mg by mouth every 8 (eight) hours as needed for moderate pain.      Marland Kitchen insulin glargine (LANTUS) 100 UNIT/ML injection Inject 25 Units into the skin at bedtime.      Marland Kitchen lisinopril (PRINIVIL,ZESTRIL) 20 MG tablet Take 1 tablet (20 mg total) by mouth daily.  30 tablet  1  . simvastatin (ZOCOR) 80 MG tablet Take 1 tablet (80 mg total) by mouth  every evening.  30 tablet  1    Home: Home Living Family/patient expects to be discharged to:: Private residence Living Arrangements: Alone Available Help at Discharge: Family;Available PRN/intermittently Type of Home: House Home Access: Ramped entrance Home Layout: One level Home Equipment: Walker - 2 wheels;Shower seat;Bedside commode;Cane - single point;Wheelchair - manual Additional Comments: niece checks in on pt daily; very involved family  Functional History: Prior Function Level of Independence: Independent with assistive device(s) Comments: Amb with RW or cane "Which ever is closest." Also has WC, tub bench, BSC Functional Status:  Mobility: Bed Mobility Overal bed mobility: Needs Assistance;+2 for physical assistance Bed Mobility:  Supine to Sit Supine to sit: Mod assist;+2 for physical assistance;HOB elevated General bed mobility comments: Mod assist with HOB elevated. Support for RLE to off of bed and Mod assist for trunk control. Difficult maintaining upright position due to fatigue. Transfers Overall transfer level: Needs assistance Equipment used: None Transfers: Government social research officer transfers: +2 physical assistance;Max assist General transfer comment: Pt able to assist initially with poster transfer into chair however fatigued quickly and required Max assist +2 to scoot. She required VCs for hand placement and technique and educated on hand/trunk positioning to make transfer easier.      ADL:    Cognition: Cognition Overall Cognitive Status: Within Functional Limits for tasks assessed Orientation Level: Oriented X4 Cognition Arousal/Alertness: Awake/alert Behavior During Therapy: WFL for tasks assessed/performed Overall Cognitive Status: Within Functional Limits for tasks assessed  Blood pressure 151/78, pulse 89, temperature 98.4 F (36.9 C), temperature source Oral, resp. rate 18, height 5\' 6"  (1.676 m), weight 79.1 kg (174 lb  6.1 oz), SpO2 98.00%. Physical Exam  Nursing note and vitals reviewed. Constitutional: She is oriented to person, place, and time. She appears well-developed and well-nourished.  HENT:  Head: Normocephalic and atraumatic.  Eyes: Conjunctivae are normal. Pupils are equal, round, and reactive to light.  Neck: Normal range of motion. Neck supple.  Cardiovascular: Normal rate and regular rhythm.   No murmur heard. Respiratory: Effort normal and breath sounds normal. No respiratory distress. She has no wheezes.  GI: She exhibits no distension. There is no tenderness.  Musculoskeletal:  L-BKA site well healed.  R-BKA with moderate edema and compressive dressing  Neurological: She is alert and oriented to person, place, and time.  Skin: Skin is warm and dry.   motor strength is 5/5 bilateral upper extremities Lower extremities have 3 minus hip flexion on the right and 5 minus on the left knee flexion extension of the left is normal on the right is trace   Results for orders placed during the hospital encounter of 10/28/13 (from the past 24 hour(s))  GLUCOSE, CAPILLARY     Status: Abnormal   Collection Time    11/02/13  4:07 PM      Result Value Ref Range   Glucose-Capillary 108 (*) 70 - 99 mg/dL  GLUCOSE, CAPILLARY     Status: None   Collection Time    11/02/13 10:11 PM      Result Value Ref Range   Glucose-Capillary 92  70 - 99 mg/dL  GLUCOSE, CAPILLARY     Status: Abnormal   Collection Time    11/03/13  6:29 AM      Result Value Ref Range   Glucose-Capillary 118 (*) 70 - 99 mg/dL  BASIC METABOLIC PANEL     Status: Abnormal   Collection Time    11/03/13  9:10 AM      Result Value Ref Range   Sodium 136 (*) 137 - 147 mEq/L   Potassium 3.6 (*) 3.7 - 5.3 mEq/L   Chloride 97  96 - 112 mEq/L   CO2 28  19 - 32 mEq/L   Glucose, Bld 115 (*) 70 - 99 mg/dL   BUN 6  6 - 23 mg/dL   Creatinine, Ser 0.58  0.50 - 1.10 mg/dL   Calcium 8.0 (*) 8.4 - 10.5 mg/dL   GFR calc non Af Amer >90  >90  mL/min   GFR calc Af Amer >90  >90 mL/min  CBC     Status: Abnormal   Collection Time  11/03/13  9:10 AM      Result Value Ref Range   WBC 10.1  4.0 - 10.5 K/uL   RBC 3.18 (*) 3.87 - 5.11 MIL/uL   Hemoglobin 8.2 (*) 12.0 - 15.0 g/dL   HCT 25.1 (*) 36.0 - 46.0 %   MCV 78.9  78.0 - 100.0 fL   MCH 25.8 (*) 26.0 - 34.0 pg   MCHC 32.7  30.0 - 36.0 g/dL   RDW 14.4  11.5 - 15.5 %   Platelets 317  150 - 400 K/uL  GLUCOSE, CAPILLARY     Status: Abnormal   Collection Time    11/03/13 11:26 AM      Result Value Ref Range   Glucose-Capillary 143 (*) 70 - 99 mg/dL   Comment 1 Notify RN     Comment 2 Documented in Chart     No results found.  Assessment/Plan: Diagnosis: Right BKA gangrene postop day #1 1. Does the need for close, 24 hr/day medical supervision in concert with the patient's rehab needs make it unreasonable for this patient to be served in a less intensive setting? Yes 2. Co-Morbidities requiring supervision/potential complications: Left BKA healed, diabetes uncontrolled with neuropathy 3. Due to bladder management, bowel management, safety, skin/wound care, disease management, medication administration and pain management, does the patient require 24 hr/day rehab nursing? Potentially 4. Does the patient require coordinated care of a physician, rehab nurse, PT (1-2 hrs/day, 5 days/week) and OT (1-2 hrs/day, 5 days/week) to address physical and functional deficits in the context of the above medical diagnosis(es)? Yes Addressing deficits in the following areas: balance, endurance, locomotion, strength, transferring, bathing, dressing, feeding and toileting 5. Can the patient actively participate in an intensive therapy program of at least 3 hrs of therapy per day at least 5 days per week? Potentially 6. The potential for patient to make measurable gains while on inpatient rehab is excellent 7. Anticipated functional outcomes upon discharge from inpatient rehab are supervision  with  PT, supervision with OT, n/a with SLP. 8. Estimated rehab length of stay to reach the above functional goals is: 7 days 9. Does the patient have adequate social supports to accommodate these discharge functional goals? Potentially 10. Anticipated D/C setting: Home 11. Anticipated post D/C treatments: [place] therapy 12. Overall Rehab/Functional Prognosis: excellent  RECOMMENDATIONS: This patient's condition is appropriate for continued rehabilitative care in the following setting: CIR Patient has agreed to participate in recommended program. Potentially Note that insurance prior authorization may be required for reimbursement for recommended care.  Comment: Should be ready on postop day #3 if tolerating therapy and able to maintain adequate pain control on oral medications only    11/03/2013

## 2013-11-03 NOTE — Progress Notes (Signed)
Patient ID: [name], female   DOB: [birth year], 58 y.o.   MRN: [id number] Postoperative day 1 right transtibial amputation. Patient states the pain is improving. Dressing clean and dry. Physical therapy for transfer training. Patient safe for discharge to skilled nursing at this time. May discontinue IV antibiotics at this time. I will followup in the office in 3 weeks.

## 2013-11-03 NOTE — Progress Notes (Signed)
This CSW provided handoff to covering CSW for today. This CSW signing off.   Ky Barban, MSW, [place] Clinical Social Worker

## 2013-11-03 NOTE — Progress Notes (Signed)
UR complete.  Courtney Robarge RN, MSN 

## 2013-11-03 NOTE — Progress Notes (Signed)
[facility] Progress Note   DENVER HARDER SHF:[id number] DOB: 1955-12-21 DOA: 10/28/2013 PCP: [name], MD  Brief narrative: [name] is a 58 y.o. female   with a history of hypertension, diabetes mellitus, stroke, hyperlipidemia presents with two-day history of increasing right foot pain, edema, erythema. She denies any recent injuries or trauma. In emergency department, x-ray of the foot revealed soft tissue gas. There was concern for dressing fasciitis. The patient was emergently taken to surgery by Dr. [name] for irrigation and debridement. Operative findings were not consistent with necrotizing fasciitis. The patient was started on intravenous antibiotics pending culture data. Cultures did not reveal predominant organism. Treatment remains empiric. On the morning of 10/31/2013, ischemic changes were noted to the patient's right great toe and right forefoot area. Vascular surgery was consulted and did not feel that the patient was a revascularization candidate. As a result, right BKA is planned for 11/02/2013.    Subjective: S/p surgery- no complaints of pain- incontinent of urine this AM- had BM last night  Assessment/Plan: Principal Problem:   Necrotic diabetic foot infection/Gangrene of Foot/ osteomyelitis  -s/p emergent I&D 10/28/13--Dr. [name]  -no evidence of necrotizing fasciitis or septic tenosynovitis on operative findings  -Continue intravenous vancomycin, and straining, clindamycin  -cultures neg for predominant organism--tx was empiric  -10/31/13--dusky right great toe and forefoot-->consult vascular--spoke with Dr. 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  - stop IV antibiotics today (6/18)-  Fever/Leukocytosis  -Am 10/31/13--Temperature 100.27F with WBC increased to 14.4-->13.3  -likely due to R-foot ischemia/infection  -UA--no pyuria  -10/29/13 blood culture negatives  Diabetes mellitus  type 2, uncontrolled with complications  -Continue Lantus-->increased to 24 units - following sugars -NovoLog sliding scale  -Hemoglobin A1c--7.3   Hypertension  -elevated BP partly due to pain but patient states BP is often this high -Continue monitor  -Continue lisinopril and hydralazine  -increased hydralazine to 50mg  q 8hrs   Hyperlipidemia  -Continue statin   Tobacco abuse  -Tobacco cessation discussed   Hypokalemia  -replete again today - mag low norma- replaced on 6/17 as well  Family Communication: Pt at beside  Disposition Plan: SNF when medically stable - OK to be d/c'd   Antibiotics:  Vancomycin 10/28/13>>> 6/18 Aztreonam 10/28/13>>> 6/18 Clindamycin 10/28/13>>>6/18   DVT prophylaxis: Lovenox  Objective: Filed Weights   10/29/13 0305  Weight: 79.1 kg (174 lb 6.1 oz)    Vitals Filed Vitals:   11/02/13 1525 11/02/13 2111 11/03/13 0140 11/03/13 0640  BP: 168/77 158/64 166/85 151/78  Pulse: 95 86 94 89  Temp: 98.7 F (37.1 C) 97.7 F (36.5 C) 98.2 F (36.8 C) 98.4 F (36.9 C)  TempSrc:  Oral Oral Oral  Resp:  16 16 18   Height:      Weight:      SpO2: 100% 100% 98% 98%     Intake/Output Summary (Last 24 hours) at 11/03/13 1257 Last data filed at 11/02/13 1735  Gross per 24 hour  Intake      0 ml  Output      0 ml  Net      0 ml     Exam: General: No acute respiratory distress- AAO x 3 Lungs: Clear to auscultation bilaterally without wheezes or crackles Cardiovascular: Regular rate and rhythm without murmur gallop or rub normal S1 and S2 Abdomen: Nontender, nondistended, soft, bowel sounds positive, no rebound, no ascites, no appreciable mass Extremities: No significant cyanosis, clubbing, or  edema bilateral lower extremities- right AKA in ACE wrap- left BKA  Data Reviewed: Basic Metabolic Panel:  Recent Labs Lab 10/30/13 0605 10/31/13 0630 11/01/13 0625 11/02/13 0535 11/03/13 0910  NA 142 140 140 139 136*  K 3.5* 3.3* 3.1*  3.5* 3.6*  CL 105 104 102 100 97  CO2 25 24 25 23 28   GLUCOSE 161* 95 110* 130* 115*  BUN 12 10 7 7 6   CREATININE 0.80 0.69 0.66 0.62 0.58  CALCIUM 7.7* 7.7* 7.5* 7.6* 8.0*  MG 1.7  --   --  1.6  --    Liver Function Tests: No results found for this basename: AST, ALT, ALKPHOS, BILITOT, PROT, ALBUMIN,  in the last 168 hours No results found for this basename: LIPASE, AMYLASE,  in the last 168 hours No results found for this basename: AMMONIA,  in the last 168 hours CBC:  Recent Labs Lab 10/30/13 0605 10/31/13 0630 11/01/13 0625 11/02/13 0535 11/03/13 0910  WBC 9.8 14.4* 13.3* 14.2* 10.1  HGB 9.0* 9.3* 8.6* 8.7* 8.2*  HCT 28.5* 28.5* 26.3* 26.4* 25.1*  MCV 83.6 80.5 79.7 79.3 78.9  PLT 271 310 307 311 317   Cardiac Enzymes:  Recent Labs Lab 10/30/13 0605  CKTOTAL 132   BNP (last 3 results) No results found for this basename: PROBNP,  in the last 8760 hours CBG:  Recent Labs Lab 11/02/13 1250 11/02/13 1607 11/02/13 2211 11/03/13 0629 11/03/13 1126  GLUCAP 97 108* 92 118* 143*    Recent Results (from the past 240 hour(s))  CULTURE, BLOOD (ROUTINE X 2)     Status: None   Collection Time    10/29/13 12:45 AM      Result Value Ref Range Status   Specimen Description BLOOD LEFT FOREARM   Final   Special Requests BOTTLES DRAWN AEROBIC AND ANAEROBIC 5CC EACH   Final   Culture  Setup Time     Final   Value: 10/29/2013 12:13     Performed at [facility]   Culture     Final   Value:        BLOOD CULTURE RECEIVED NO GROWTH TO DATE CULTURE WILL BE HELD FOR 5 DAYS BEFORE ISSUING A FINAL NEGATIVE REPORT     Performed at [facility]   Report Status PENDING   Incomplete  WOUND CULTURE     Status: None   Collection Time    10/29/13  2:45 AM      Result Value Ref Range Status   Specimen Description WOUND RIGHT FOOT   Final   Special Requests PT ON CLEOCIN, VANCOMYCIN, AZTREONAM   Final   Gram Stain     Final   Value: NO WBC SEEN     RARE SQUAMOUS  EPITHELIAL CELLS PRESENT     RARE GRAM POSITIVE COCCI IN PAIRS     Performed at [facility]   Culture     Final   Value: MULTIPLE ORGANISMS PRESENT, NONE PREDOMINANT NO STAPHYLOCOCCUS AUREUS ISOLATED NO GROUP A STREP (S.PYOGENES) ISOLATED     Performed at [facility]   Report Status 10/31/2013 FINAL   Final  ANAEROBIC CULTURE     Status: None   Collection Time    10/29/13  2:45 AM      Result Value Ref Range Status   Specimen Description WOUND RIGHT FOOT   Final   Special Requests PT ON CLEOCIN, VANCOMYCIN, AZTREONAM   Final   Gram Stain  Final   Value: RARE WBC PRESENT, PREDOMINANTLY PMN     RARE SQUAMOUS EPITHELIAL CELLS PRESENT     NO ORGANISMS SEEN     Performed at [facility]   Culture     Final   Value: NO ANAEROBES ISOLATED; CULTURE IN PROGRESS FOR 5 DAYS     Performed at [facility]   Report Status PENDING   Incomplete  MRSA PCR SCREENING     Status: None   Collection Time    10/29/13  3:15 AM      Result Value Ref Range Status   MRSA by PCR NEGATIVE  NEGATIVE Final   Comment:            The GeneXpert MRSA Assay (FDA     approved for NASAL specimens     only), is one component of a     comprehensive MRSA colonization     surveillance program. It is not     intended to diagnose MRSA     infection nor to guide or     monitor treatment for     MRSA infections.  CULTURE, BLOOD (ROUTINE X 2)     Status: None   Collection Time    10/29/13  4:00 AM      Result Value Ref Range Status   Specimen Description BLOOD RIGHT HAND   Final   Special Requests BOTTLES DRAWN AEROBIC ONLY 5CC   Final   Culture  Setup Time     Final   Value: 10/29/2013 12:13     Performed at [facility]   Culture     Final   Value:        BLOOD CULTURE RECEIVED NO GROWTH TO DATE CULTURE WILL BE HELD FOR 5 DAYS BEFORE ISSUING A FINAL NEGATIVE REPORT     Performed at [facility]   Report Status PENDING   Incomplete  URINE CULTURE     Status:  None   Collection Time    10/31/13  6:29 PM      Result Value Ref Range Status   Specimen Description URINE, RANDOM   Final   Special Requests NONE   Final   Culture  Setup Time     Final   Value: 11/01/2013 00:19     Performed at [facility]     Final   Value: NO GROWTH     Performed at [facility]   Culture     Final   Value: NO GROWTH     Performed at [facility]   Report Status 11/02/2013 FINAL   Final     Studies:  Recent x-ray studies have been reviewed in detail by the Attending Physician  Scheduled Meds:  Scheduled Meds: . aspirin  81 mg Oral Daily  . atorvastatin  40 mg Oral q1800  . aztreonam  2 g Intravenous 3 times per day  . clindamycin (CLEOCIN) IV  600 mg Intravenous 3 times per day  . docusate sodium  100 mg Oral BID  . enoxaparin (LOVENOX) injection  40 mg Subcutaneous Q24H  . hydrALAZINE  50 mg Oral 3 times per day  . insulin aspart  0-15 Units Subcutaneous TID WC  . insulin glargine  24 Units Subcutaneous QHS  . lisinopril  20 mg Oral Daily  . magnesium sulfate 1 - 4 g bolus IVPB  2 g Intravenous Once  . potassium chloride  40 mEq Oral Q4H  .  vancomycin  1,000 mg Intravenous Once  . vancomycin  1,000 mg Intravenous Q12H   Continuous Infusions: . sodium chloride 125 mL/hr at 11/01/13 0511  . sodium chloride    . lactated ringers 10 mL/hr at 11/02/13 1116    Time spent on care of this patient: > 35 min   [place], MD 11/03/2013, 12:57 PM  LOS: 6 days   [facility] Office  [phone] Pager - Text Page per Shea Evans   If 7PM-7AM, please contact night-coverage [email or web address]

## 2013-11-04 LAB — CULTURE, BLOOD (ROUTINE X 2)
CULTURE: NO GROWTH
Culture: NO GROWTH

## 2013-11-04 LAB — BASIC METABOLIC PANEL
BUN: 7 mg/dL (ref 6–23)
CALCIUM: 8.3 mg/dL — AB (ref 8.4–10.5)
CO2: 28 mEq/L (ref 19–32)
Chloride: 102 mEq/L (ref 96–112)
Creatinine, Ser: 0.62 mg/dL (ref 0.50–1.10)
GFR calc Af Amer: >90 mL/min (ref >90)
GLUCOSE: 102 mg/dL — AB (ref 70–99)
Potassium: 4.2 mEq/L (ref 3.7–5.3)
SODIUM: 141 meq/L (ref 137–147)

## 2013-11-04 LAB — GLUCOSE, CAPILLARY
Glucose-Capillary: 91 mg/dL (ref 70–99)
Glucose-Capillary: 140 mg/dL — ABNORMAL HIGH (ref 70–99)

## 2013-11-04 MED ORDER — METHOCARBAMOL 500 MG PO TABS: 500.0000 mg | ORAL_TABLET | Freq: Four times a day (QID) | ORAL | Status: DC | PRN

## 2013-11-04 MED ORDER — OXYCODONE-ACETAMINOPHEN 5-325 MG PO TABS: 1.0000 | ORAL_TABLET | ORAL | Status: DC | PRN

## 2013-11-04 MED ORDER — POLYETHYLENE GLYCOL 3350 17 G PO PACK: 17.0000 g | PACK | Freq: Every day | ORAL | Status: DC | PRN

## 2013-11-04 MED ORDER — DSS 100 MG PO CAPS: 100.0000 mg | ORAL_CAPSULE | Freq: Two times a day (BID) | ORAL | Status: DC

## 2013-11-04 MED ORDER — INSULIN ASPART 100 UNIT/ML ~~LOC~~ SOLN: 0.0000 [IU] | Freq: Three times a day (TID) | SUBCUTANEOUS | Status: DC

## 2013-11-04 NOTE — Progress Notes (Signed)
I met with pt, as well as contacted her sister, Mariann Laster, and Niece, Romie Minus by phone. Pt lives alone and does not have a handicapped home which is wheelchair accessible. Family unable to provide increased assistance due to work obligations. We are all in agreement to SNF rehab to provide prolonged rehab as well as give family time to work on issues of housing, insurance, and caregivers. Pt is aware and in agreement. I will discuss with RN CM and SW. We will sign off. [phone]

## 2013-11-04 NOTE — Progress Notes (Signed)
CSW spoke to Dr. Wynelle Cleveland about d/c disposition.  CIR order placed by MD and CIR Liason is aware of patient.  Per MD- patient is medically stable for d/c tomorrow.  SNF bed offers are in place if CIR cannot admit today.  Patient/family prefer CIR.  CSW received a call from patient's sister [name]. She stated that if patient had to go to a SNF- patient/family would prefer bed at [facility].  Patient would require Letter of Guarantee due to not having any insurance.  Will follow up tomorrow to see what CIR decides about patient.  [name]. [name], [name]

## 2013-11-04 NOTE — Discharge Summary (Addendum)
Physician Discharge Summary  [name] NWG:[id number] DOB: 1956/01/09 DOA: 10/28/2013  PCP: [name], MD  Admit date: 10/28/2013 Discharge date: 11/04/2013  Time spent: >45 minutes  Recommendations for Outpatient Follow-up:  1. 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 female with a history of hypertension, diabetes mellitus, stroke, hyperlipidemia presents with two-day history of increasing right foot pain, edema, erythema. She denies any recent injuries or trauma. In emergency department, x-ray of the foot revealed soft tissue gas. There was concern for necrotizing fasciitis. The patient was emergently taken to surgery by Dr. [name] for irrigation and debridement. Operative findings were not consistent with necrotizing fasciitis. The patient was started on intravenous antibiotics pending culture data. Cultures did not reveal predominant organism. On the morning of 10/31/2013, ischemic changes were noted to the patient's right great toe and right forefoot area. Vascular surgery was consulted and did not feel that the patient was a revascularization candidate.  Hospital Course:  Principal Problem:  Right sided Necrotic diabetic foot infection/Gangrene of Foot -10/29/13 - emergent I&D --Dr. [name]  -no evidence of necrotizing fasciitis or septic tenosynovitis on operative findings  -Continued intravenous antibiotics-cultures neg for predominant organism--tx was empiric   -10/31/13--dusky right great toe and forefoot-->consult vascular--spoke with Dr. [name] - see his consult- he discussed the finding with Dr [name] and both recommended pt undergo BKA  -11/02/2013 R-BKA   - stop IV antibiotics  (6/18)-   Fever/Leukocytosis - Sepsis -Am 10/31/13--Temperature 100.83F with WBC increased to 14.4-->13.3  -likely due to R-foot ischemia/infection  -UA--no pyuria  -10/29/13 blood culture negatives   Diabetes mellitus type 2, uncontrolled with complications  -Continue Lantus-->increased to 24 units - following sugars  -NovoLog sliding scale  -Hemoglobin A1c--7.3   Hypertension  -elevated BP partly due to pain but patient states BP is often this high  -Continue monitor  -increased hydralazine from 25 mg Q8 to 50mg  q 8hrs  -Continue lisinopril and hydralazine   Hyperlipidemia  -Continue statin   Tobacco abuse  -Tobacco cessation discussed   Hypokalemia  -replete again today  - mag low normal- replaced on 6/17 as well  Consultations:  Orthopedics  Vascular surgery   Discharge Exam: Filed Vitals:   11/04/13 0634  BP: 179/81  Pulse: 96  Temp: 98.2 F (36.8 C)  Resp: 18    General: AAO x 3, no distress, morbidly obese female Cardiovascular: RRR, no murmurs Respiratory: CTA b/l  Extremities: b/l BKA- right stump in dressing  Discharge Instructions You were cared for by a hospitalist during your hospital stay. If you have any questions about your discharge medications or the care you received while you were in the hospital after you are discharged, you can call the unit and asked to speak with the hospitalist on call if the hospitalist that took care of you is not available. Once you are discharged, your primary care physician will handle any further medical issues. Please note that NO REFILLS  for any discharge medications will be authorized once you are discharged, as it is imperative that you return to your primary care physician (or establish a  relationship with a primary care physician if you do not have one) for your aftercare needs so that they can reassess your need for medications and monitor your lab values.     Medication List    STOP taking these medications       glimepiride 4 MG tablet  Commonly known as:  AMARYL      TAKE these medications       DSS 100 MG Caps  Take 100 mg by mouth 2 (two) times daily.     hydrALAZINE 25 MG tablet  Commonly known as:  APRESOLINE  Take 25 mg by mouth 4 (four) times daily.     hydrochlorothiazide 25 MG tablet  Commonly known as:  HYDRODIURIL  Take 25 mg by mouth every morning.     ibuprofen 800 MG tablet  Commonly known as:  ADVIL,MOTRIN  Take 800 mg by mouth every 8 (eight) hours as needed for moderate pain.     insulin aspart 100 UNIT/ML injection  Commonly known as:  novoLOG  Inject 0-15 Units into the skin 3 (three) times daily with meals.     LANTUS 100 UNIT/ML injection  Generic drug:  insulin glargine  Inject 25 Units into the skin at bedtime.     lisinopril 20 MG tablet  Commonly known as:  PRINIVIL,ZESTRIL  Take 1 tablet (20 mg total) by mouth daily.     methocarbamol 500 MG tablet  Commonly known as:  ROBAXIN  Take 1 tablet (500 mg total) by mouth every 6 (six) hours as needed for muscle spasms.     oxyCODONE-acetaminophen 5-325 MG per tablet  Commonly known as:  PERCOCET/ROXICET  Take 1-2 tablets by mouth every 4 (four) hours as needed for moderate pain or severe pain.     polyethylene glycol packet  Commonly known as:  MIRALAX / GLYCOLAX  Take 17 g by mouth daily as needed for moderate constipation.     simvastatin 80 MG tablet  Commonly known as:  ZOCOR  Take 1 tablet (80 mg total) by mouth every evening.       Allergies  Allergen Reactions  . Banana Swelling and Other (See Comments)    Facial swelling  . Penicillins Itching, Swelling and Rash    Face swells and break out  . Strawberry Swelling and Other (See Comments)    Facial  swelling   Follow-up Information   Follow up with DUDA,MARCUS V, MD In 3 weeks.   Specialty:  Orthopedic Surgery   Contact information:   [place] [address] [phone]        The results of significant diagnostics from this hospitalization (including imaging, microbiology, ancillary and laboratory) are listed below for reference.    Significant Diagnostic Studies: Dg Chest Portable 1 View  10/29/2013   CLINICAL DATA:  Preoperative chest radiograph for right foot surgery.  EXAM: PORTABLE CHEST - 1 VIEW  COMPARISON:  Chest radiograph performed 09/30/2013  FINDINGS: The lungs are well-aerated and clear. There is no evidence of focal opacification, pleural effusion or pneumothorax.  The cardiomediastinal silhouette is within normal limits. No acute osseous abnormalities are seen.  IMPRESSION: No acute cardiopulmonary process seen.   Electronically Signed   By: [name] M.D.   On: 10/29/2013 01:37   Dg Foot Complete Right  10/28/2013   CLINICAL  DATA:  Right foot pain and swelling.  EXAM: RIGHT FOOT COMPLETE - 3+ VIEW  COMPARISON:  Right foot radiographs performed 09/15/2013  FINDINGS: There is no evidence of fracture or dislocation. There appears to be chronic disruption of the fifth proximal interphalangeal joint. The joint spaces are preserved. There is no evidence of talar subluxation; the subtalar joint is unremarkable in appearance.  Prominent soft tissue swelling and soft tissue air is noted between the first and second toes, along the plantar surface of the forefoot.  Diffuse vascular calcifications are seen.  IMPRESSION: 1. Prominent soft tissue swelling and soft tissue air noted between the first and second toes, along the plantar surface of the forefoot. This is compatible with gangrene; necrotizing fasciitis cannot be excluded. Would correlate clinically. 2. No evidence of acute fracture or dislocation; no definite osseous erosions identified. 3. Diffuse  vascular calcifications seen. 4. 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   Electronically Signed   By: [name] M.D.   On: 10/28/2013 23:08    Microbiology: Recent Results (from the past 240 hour(s))  CULTURE, BLOOD (ROUTINE X 2)     Status: None   Collection Time    10/29/13 12:45 AM      Result Value Ref Range Status   Specimen Description BLOOD LEFT FOREARM   Final   Special Requests BOTTLES DRAWN AEROBIC AND ANAEROBIC 5CC EACH   Final   Culture  Setup Time     Final   Value: 10/29/2013 12:13     Performed at [facility]   Culture     Final   Value:        BLOOD CULTURE RECEIVED NO GROWTH TO DATE CULTURE WILL BE HELD FOR 5 DAYS BEFORE ISSUING A FINAL NEGATIVE REPORT     Performed at [facility]   Report Status PENDING   Incomplete  WOUND CULTURE     Status: None   Collection Time    10/29/13  2:45 AM      Result Value Ref Range Status   Specimen Description WOUND RIGHT FOOT   Final   Special Requests PT ON CLEOCIN, VANCOMYCIN, AZTREONAM   Final   Gram Stain     Final   Value: NO WBC SEEN     RARE SQUAMOUS EPITHELIAL CELLS PRESENT     RARE GRAM POSITIVE COCCI IN PAIRS     Performed at [facility]   Culture     Final   Value: MULTIPLE ORGANISMS PRESENT, NONE PREDOMINANT NO STAPHYLOCOCCUS AUREUS ISOLATED NO GROUP A STREP (S.PYOGENES) ISOLATED     Performed at [facility]   Report Status 10/31/2013 FINAL   Final  ANAEROBIC CULTURE     Status: None   Collection Time    10/29/13  2:45 AM      Result Value Ref Range Status   Specimen Description WOUND RIGHT FOOT   Final   Special Requests PT ON CLEOCIN, VANCOMYCIN, AZTREONAM   Final   Gram Stain     Final   Value: RARE WBC PRESENT, PREDOMINANTLY PMN     RARE SQUAMOUS EPITHELIAL CELLS PRESENT     NO ORGANISMS SEEN     Performed at [facility]   Culture     Final   Value: NO ANAEROBES ISOLATED     Performed at [facility]   Report  Status 11/03/2013 FINAL   Final  MRSA PCR SCREENING     Status: None  Collection Time    10/29/13  3:15 AM      Result Value Ref Range Status   MRSA by PCR NEGATIVE  NEGATIVE Final   Comment:            The GeneXpert MRSA Assay (FDA     approved for NASAL specimens     only), is one component of a     comprehensive MRSA colonization     surveillance program. It is not     intended to diagnose MRSA     infection nor to guide or     monitor treatment for     MRSA infections.  CULTURE, BLOOD (ROUTINE X 2)     Status: None   Collection Time    10/29/13  4:00 AM      Result Value Ref Range Status   Specimen Description BLOOD RIGHT HAND   Final   Special Requests BOTTLES DRAWN AEROBIC ONLY 5CC   Final   Culture  Setup Time     Final   Value: 10/29/2013 12:13     Performed at [facility]   Culture     Final   Value:        BLOOD CULTURE RECEIVED NO GROWTH TO DATE CULTURE WILL BE HELD FOR 5 DAYS BEFORE ISSUING A FINAL NEGATIVE REPORT     Performed at [facility]   Report Status PENDING   Incomplete  URINE CULTURE     Status: None   Collection Time    10/31/13  6:29 PM      Result Value Ref Range Status   Specimen Description URINE, RANDOM   Final   Special Requests NONE   Final   Culture  Setup Time     Final   Value: 11/01/2013 00:19     Performed at [facility]     Final   Value: NO GROWTH     Performed at [facility]   Culture     Final   Value: NO GROWTH     Performed at [facility]   Report Status 11/02/2013 FINAL   Final     Labs: Basic Metabolic Panel:  Recent Labs Lab 10/30/13 0605 10/31/13 0630 11/01/13 0625 11/02/13 0535 11/03/13 0910  NA 142 140 140 139 136*  K 3.5* 3.3* 3.1* 3.5* 3.6*  CL 105 104 102 100 97  CO2 25 24 25 23 28   GLUCOSE 161* 95 110* 130* 115*  BUN 12 10 7 7 6   CREATININE 0.80 0.69 0.66 0.62 0.58  CALCIUM 7.7* 7.7* 7.5* 7.6* 8.0*  MG 1.7  --   --  1.6  --    Liver  Function Tests: No results found for this basename: AST, ALT, ALKPHOS, BILITOT, PROT, ALBUMIN,  in the last 168 hours No results found for this basename: LIPASE, AMYLASE,  in the last 168 hours No results found for this basename: AMMONIA,  in the last 168 hours CBC:  Recent Labs Lab 10/30/13 0605 10/31/13 0630 11/01/13 0625 11/02/13 0535 11/03/13 0910  WBC 9.8 14.4* 13.3* 14.2* 10.1  HGB 9.0* 9.3* 8.6* 8.7* 8.2*  HCT 28.5* 28.5* 26.3* 26.4* 25.1*  MCV 83.6 80.5 79.7 79.3 78.9  PLT 271 310 307 311 317   Cardiac Enzymes:  Recent Labs Lab 10/30/13 0605  CKTOTAL 132   BNP: BNP (last 3 results) No results found for this basename: PROBNP,  in the last 8760 hours CBG:  Recent  Labs Lab 11/03/13 0629 11/03/13 1126 11/03/13 1619 11/03/13 2216 11/04/13 0641  GLUCAP 118* 143* 151* 143* 91       Signed:  Debbe Odea, MD [facility] 11/04/2013, 7:51 AM

## 2013-11-04 NOTE — Progress Notes (Signed)
Physical Therapy Treatment Patient Details Name: [name] MRN: [id number] DOB: [birth year] Today's Date: 11/04/2013    History of Present Illness 58 y.o. female s/p right foot I&D. Significant Hx of Lt transtibial amputation, HTN, and CVA..  Now s/p  R BKA    PT Comments    Patient making progress with balance and therex this session. Still having difficulty scooting with A/P transfer. Did not have prothesis in room this session. Continue to recommend comprehensive inpatient rehab (CIR) for post-acute therapy needs.   Follow Up Recommendations  CIR     Equipment Recommendations  None recommended by PT    Recommendations for Other Services       Precautions / Restrictions Precautions Precautions: Fall Restrictions RLE Weight Bearing: Non weight bearing    Mobility  Bed Mobility Overal bed mobility: Needs Assistance Bed Mobility: Supine to Sit     Supine to sit: Mod assist     General bed mobility comments: Mod assist with HOB elevated. Assitance for hip positioning. Cued patient for use of hand rails and for UE support  Transfers Overall transfer level: Needs assistance Equipment used: None         Anterior-Posterior transfers: +2 physical assistance;Max assist   General transfer comment: pt has difficulty positioning her UE's to assist with scoot or a/p transfers.  Attempted to have patient use armrest of recliner but unable.  Ambulation/Gait                 Stairs            Wheelchair Mobility    Modified Rankin (Stroke Patients Only)       Balance     Sitting balance-Leahy Scale: Fair Sitting balance - Comments: Patient able to reach above head, lean and push up from side with both elbows. ~42min EOB balance                            Cognition Arousal/Alertness: Awake/alert Behavior During Therapy: WFL for tasks assessed/performed Overall Cognitive Status: Within Functional Limits for tasks assessed                       Exercises Amputee Exercises Quad Sets: AAROM;Right;10 reps Hip ABduction/ADduction: AAROM;Right;10 reps Knee Flexion: AAROM;Right;10 reps Knee Extension: AAROM;Right;10 reps    General Comments        Pertinent Vitals/Pain no apparent distress     Home Living                      Prior Function            PT Goals (current goals can now be found in the care plan section) Progress towards PT goals: Progressing toward goals    Frequency  Min 3X/week    PT Plan Current plan remains appropriate    Co-evaluation             End of Session Equipment Utilized During Treatment: Gait belt Activity Tolerance: Patient tolerated treatment well Patient left: in chair;with call bell/phone within reach     Time: 3382-5053 PT Time Calculation (min): 26 min  Charges:  $Therapeutic Activity: 23-37 mins                    G Codes:      Jacqualyn Posey 11/04/2013, 1:06 PM 11/04/2013 Jacqualyn Posey PTA [phone] pager [phone] office

## 2013-11-06 ENCOUNTER — Encounter (HOSPITAL_COMMUNITY): Payer: Self-pay | Admitting: Orthopedic Surgery

## 2013-11-08 NOTE — Clinical Social Work Placement (Addendum)
    Clinical Social Work Department CLINICAL SOCIAL WORK PLACEMENT NOTE 11/04/2013  Patient:  YARESLY, MENZEL  Account Number:  [id number] Admit date:  10/28/2013  Clinical Social Worker:  Butch Penny CROWDER, LCSWA  Date/time:  11/03/2013 11:48 AM  Clinical Social Work is seeking post-discharge placement for this patient at the following level of care:   SKILLED NURSING   (*CSW will update this form in Epic as items are completed)   11/03/2013  Patient/family provided with [facility] Department of Clinical Social Work's list of facilities offering this level of care within the geographic area requested by the patient (or if unable, by the patient's family).  11/03/2013  Patient/family informed of their freedom to choose among providers that offer the needed level of care, that participate in Medicare, Medicaid or managed care program needed by the patient, have an available bed and are willing to accept the patient.  11/03/2013  Patient/family informed of [facility]' ownership interest in [facility], as well as of the fact that they are under no obligation to receive care at this facility.  PASARR submitted to EDS on 11/03/2013 PASARR number received on 11/03/2013  FL2 transmitted to all facilities in geographic area requested by pt/family on  11/03/2013 FL2 transmitted to all facilities within larger geographic area on   Patient informed that his/her managed care company has contracts with or will negotiate with  certain facilities, including the following:   Will need Letter of Guarantee     Patient/family informed of bed offers received:  11/03/2013 Patient chooses bed at [facility] Physician recommends and patient chooses bed at    Patient to be transferred to [facility] on  11/04/2013 Patient to be transferred to facility by Ambulance  [facility]) Patient and family notified of transfer on 11/04/2013 Name of family member notified:   Harland Dingwall- Saddie Benders  The following physician request were entered in Epic: Physician Request  Please prepare priority discharge summary and prescriptions.  Please prepare priority discharge summary, including medications  Please sign FL2.    Additional Comments: 11/04/13  Ok per MD for d/c today to SNF for short term rehab. Patient is agreeable and exhibited a fairly positive manner. She verbalized some concerns about transportation and wanted to make sure that she would not be "held" at the facility for long term care. CSW provided support and answered questions. Her neice Tamika was accepting of bed offer and stated that she felt this was what patient needed to get stronger so that she could get home. Nursing to call report.  No further CSW needs identified. CSW signing off.

## 2014-01-16 IMAGING — CR DG FOOT COMPLETE 3+V*L*
3 series · 3 of 3 positions shown · non-contrast
Comparison: 09/04/2011.

CLINICAL DATA: Ulcer left foot.

LEFT FOOT - COMPLETE 3+ VIEW

[t foot ap left]
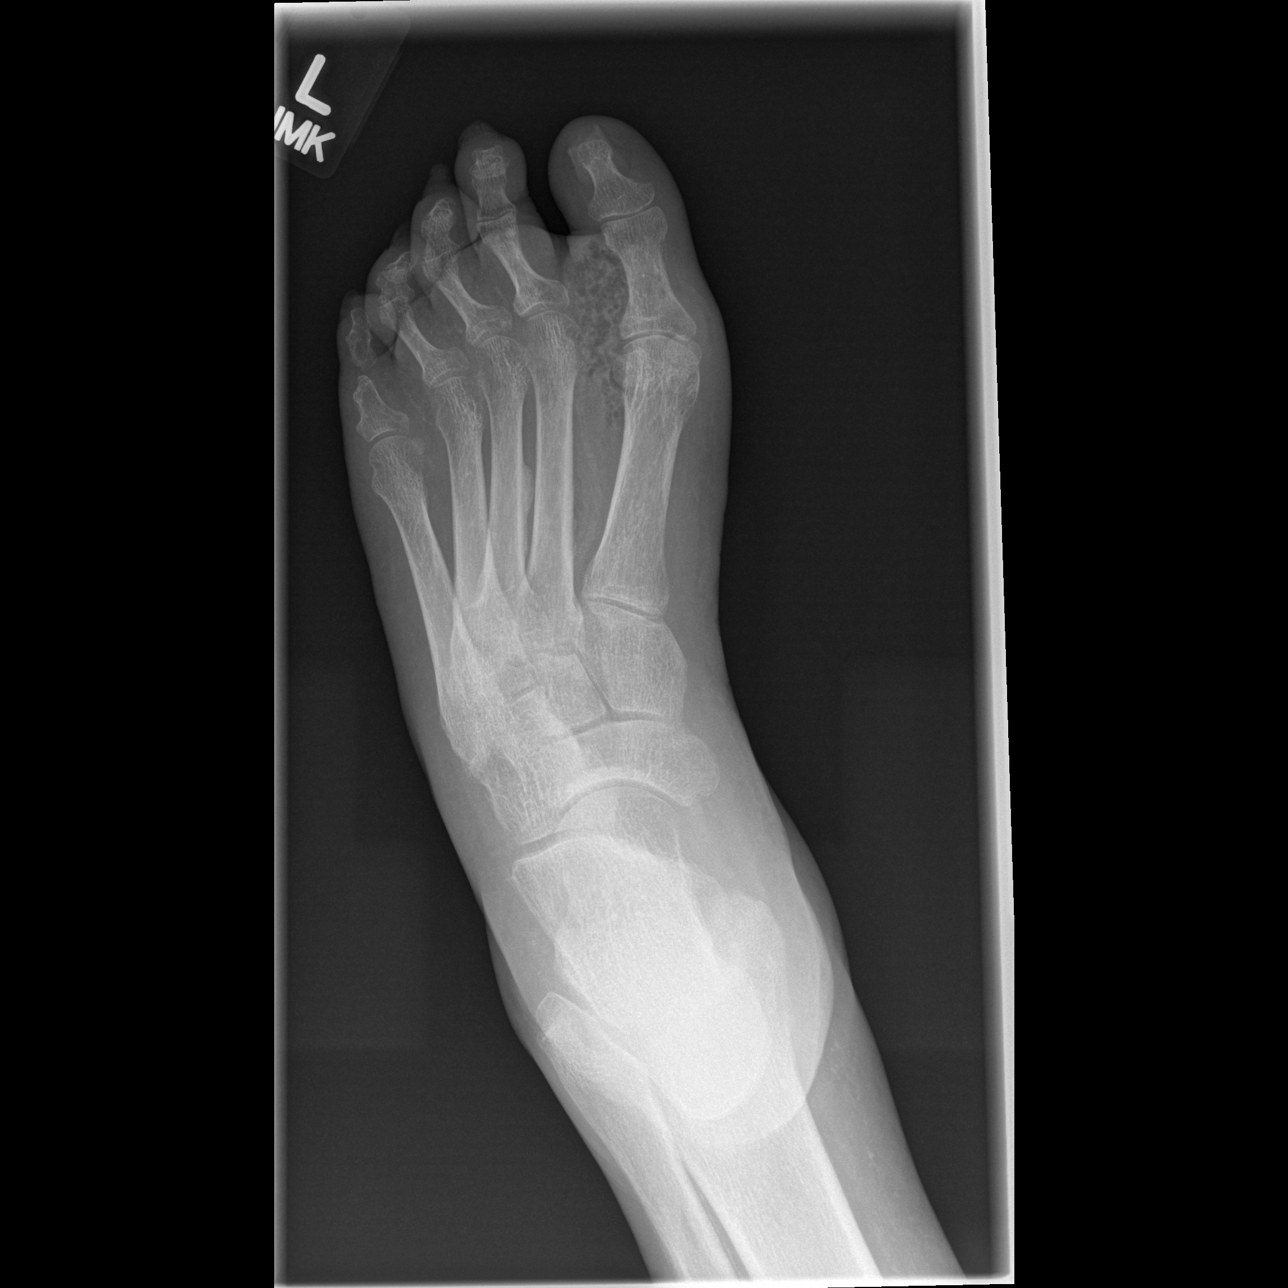

[t foot oblique left]
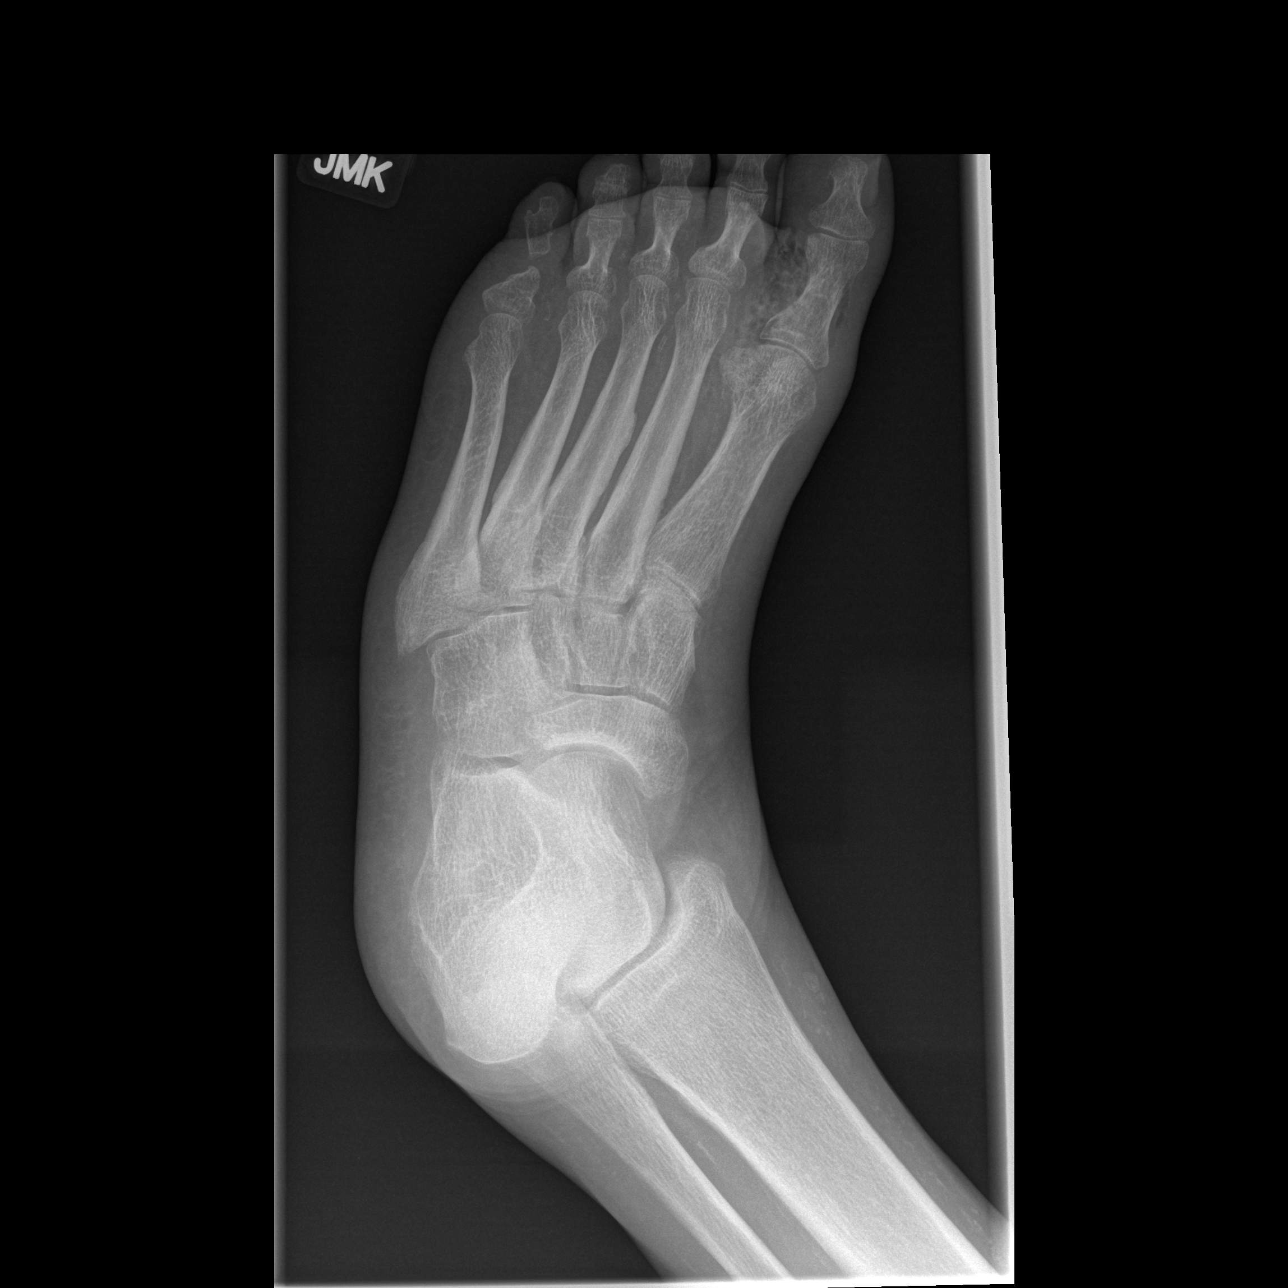

[t foot lat left]
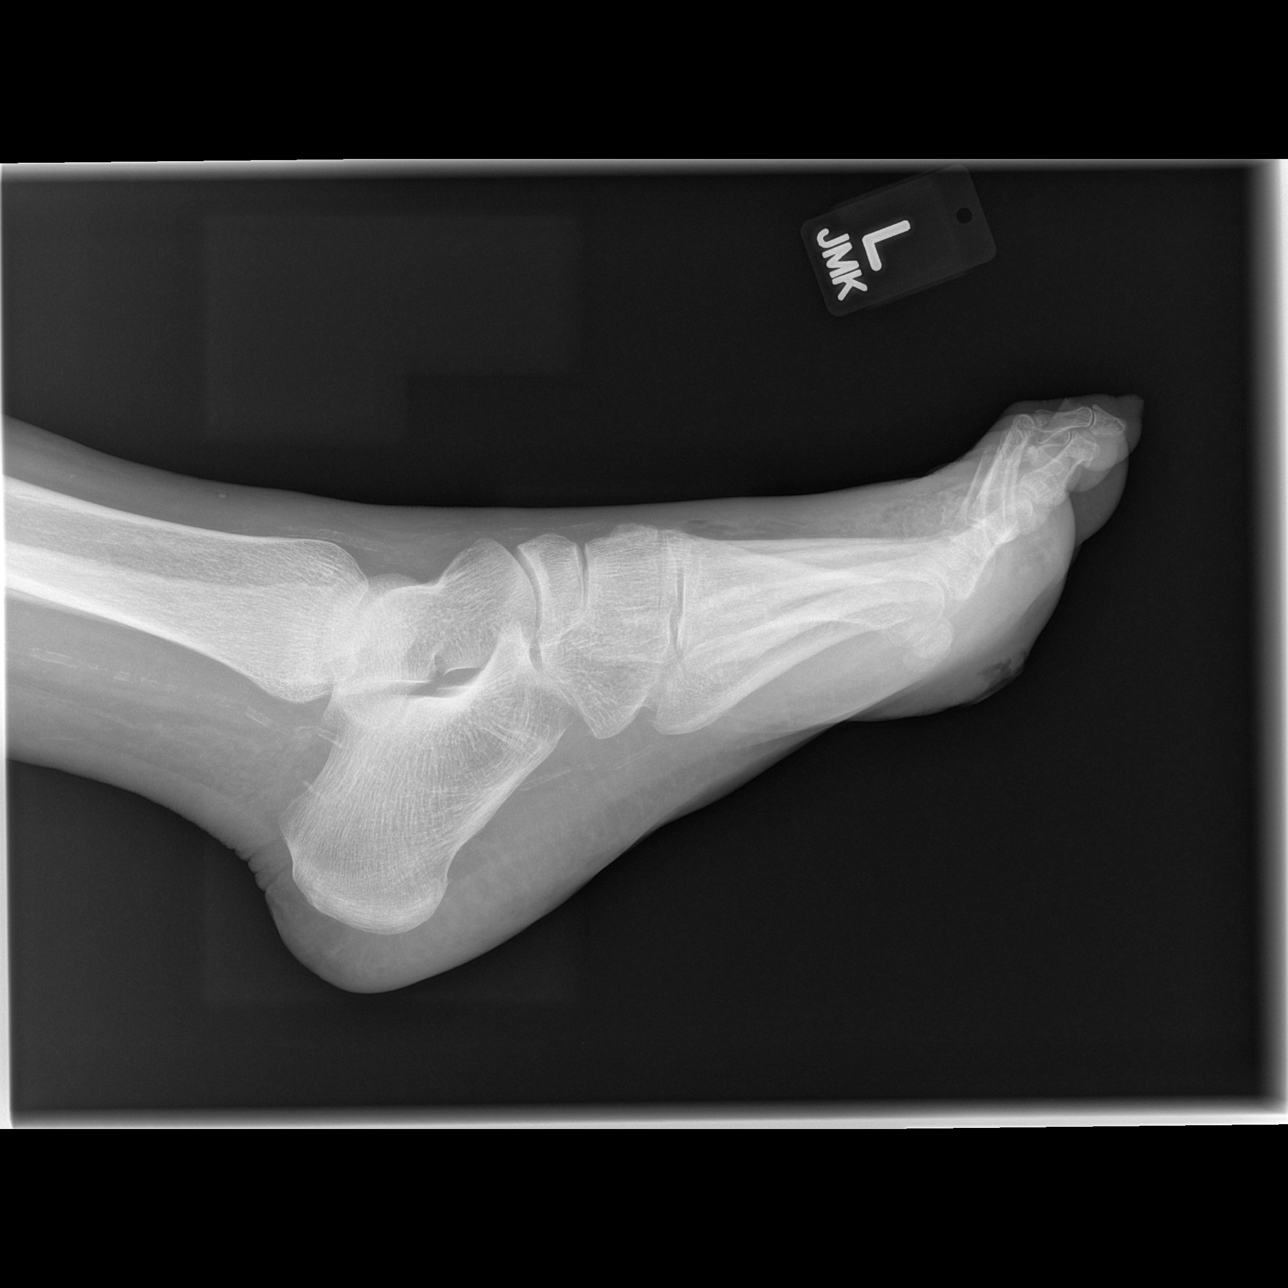

[3 of 3 positions shown; findings below may reference images not displayed]

FINDINGS: There is soft tissue gas at the base of the left great
toe and extending between the first and second metatarsals
compatible with infection.  No radiographic changes of
osteomyelitis at this time.  Old deformity and destruction of the
left fifth phalanges, stable.
IMPRESSION: Extensive soft tissue gas in the base of the left great toe and
extending between the first and second metatarsals compatible with
soft tissue infection.  No radiographic changes of acute
osteomyelitis.

## 2014-12-27 ENCOUNTER — Non-Acute Institutional Stay (SKILLED_NURSING_FACILITY): Payer: Medicaid Other | Admitting: Nurse Practitioner

## 2014-12-27 DIAGNOSIS — K5900 Constipation, unspecified: Secondary | ICD-10-CM | POA: Diagnosis not present

## 2014-12-27 DIAGNOSIS — I10 Essential (primary) hypertension: Secondary | ICD-10-CM | POA: Diagnosis not present

## 2014-12-27 DIAGNOSIS — E785 Hyperlipidemia, unspecified: Secondary | ICD-10-CM

## 2014-12-27 DIAGNOSIS — Z89512 Acquired absence of left leg below knee: Secondary | ICD-10-CM

## 2014-12-27 DIAGNOSIS — IMO0002 Reserved for concepts with insufficient information to code with codable children: Secondary | ICD-10-CM

## 2014-12-27 DIAGNOSIS — E1165 Type 2 diabetes mellitus with hyperglycemia: Secondary | ICD-10-CM | POA: Diagnosis not present

## 2014-12-27 DIAGNOSIS — Z89511 Acquired absence of right leg below knee: Secondary | ICD-10-CM

## 2014-12-27 DIAGNOSIS — E1140 Type 2 diabetes mellitus with diabetic neuropathy, unspecified: Secondary | ICD-10-CM

## 2014-12-27 NOTE — Progress Notes (Signed)
Patient ID: [name], female   DOB: [birth year], 59 y.o.   MRN: [id number]    Nursing Home Location:  [place] of Service: SNF (31)  PCP: [name], MD  Allergies  Allergen Reactions  . Banana Swelling and Other (See Comments)    Facial swelling  . Penicillins Itching, Swelling and Rash    Face swells and break out  . Strawberry Swelling and Other (See Comments)    Facial swelling    Chief Complaint  Patient presents with  . Acute Visit    follow up transfer from different facility     HPI:  Patient is a 59 y.o. female seen today at [facility] for follow up transfer. Pt with a history of hypertension, diabetes mellitus, stroke, hyperlipidemia, bilateral  BKA. Pt transferring from [place]. Pt s/p bilateral BKA, right bka done in June 2015 due to diabetic foot ulcer.   Pt reports pain in stump but PRN pain medication does help with pain. No reports of constipation. Pt reports she is eating well. Mood has been stable. No acute concerns by nursing or pt at this time.   Review of Systems:  Review of Systems  Constitutional: Negative for activity change, appetite change, fatigue and unexpected weight change.  HENT: Negative for congestion and hearing loss.   Eyes: Negative.   Respiratory: Negative for cough and shortness of breath.   Cardiovascular: Negative for chest pain, palpitations and leg swelling.  Gastrointestinal: Negative for abdominal pain, diarrhea and constipation.  Genitourinary: Negative for dysuria and difficulty urinating.  Musculoskeletal: Positive for myalgias (to left stump). Negative for arthralgias.  Skin: Negative for color change and wound.  Neurological: Negative for dizziness and weakness.  Psychiatric/Behavioral: Negative for behavioral problems, confusion and agitation.    Past Medical History  Diagnosis Date  . Atypical chest pain     a. non obstructive by cath in 2008 and 2010;  b.  02/2012 Myoview: non-ischemic, EF 57%  . HTN (hypertension)   . Hyperlipidemia   . Syncope and collapse     a. near-syncopal episode in November 2008;  b. 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  . CVA (cerebral infarction)     a.  Small right parietal noted incidentally 04/2007;  b. right sided embolic CVA 12/996;  c. 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 Peripheral neuropathy   . Obesity, unspecified   . Esophageal reflux   . Diabetes mellitus, type II   . Diaphragmatic hernia without mention of obstruction or gangrene   . Irritable bowel syndrome   . Morbid obesity   . Cellulitis     a. 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 Stroke     2013-'no residual'  . Vertigo   . Dry skin   . Constipation   . Tobacco abuse    Past Surgical History  Procedure Laterality Date  . Invasive electrophysiologic study  5/09    followed by insertion of an implantable loop recorder. S/p removal   . Colonoscopy    . Cholecystectomy    . Multiple toe surgeries    . Cardiac catheterization      LAD 30%, circumflex 50%, OM 75%, RI 60% with small branch 80%, dominant RCA 60%, EF 45-50%  . I&d extremity  12/23/2011    Procedure: IRRIGATION AND DEBRIDEMENT EXTREMITY;  Surgeon: Remo Lipps  Orlena Sheldon, MD;  Location: [place];  Service: Orthopedics;  Laterality: Left;  Left Foot  . I&d extremity  12/26/2011    Procedure: IRRIGATION AND DEBRIDEMENT EXTREMITY;  Surgeon: Wylene Simmer, MD;  Location: [place];  Service: Orthopedics;  Laterality: Left;  LEFT FOOT I&D WITH POSSIBLE WOUND VAC APPLICATION, POSSIBLE LEFT FIRST RAY AMPUTATION  . Amputation  12/30/2011    Procedure: AMPUTATION RAY;  Surgeon: Wylene Simmer, MD;  Location: [place];  Service: Orthopedics;  Laterality: Left;  LEFT FIRST RAY AMPUTATION  . Eye surgery      cataract  . Amputation  01/13/2012    Procedure: AMPUTATION BELOW KNEE;  Surgeon: Wylene Simmer, MD;  Location:  [place];  Service: Orthopedics;  Laterality: Left;  . Amputation  03/04/2012    Procedure: AMPUTATION BELOW KNEE;  Surgeon: Wylene Simmer, MD;  Location: [place];  Service: Orthopedics;  Laterality: Left;  Revision of Left Below Knee Amputation  . Tee without cardioversion N/A 07/07/2012    Procedure: TRANSESOPHAGEAL ECHOCARDIOGRAM (TEE);  Surgeon: Lelon Perla, MD;  Location: [facility] ENDOSCOPY;  Service: Cardiovascular;  Laterality: N/A;  . I&d extremity Right 10/29/2013    Procedure: ZOXWRUEAVW AND DEBRIDEMENT EXTREMITY;  Surgeon: Johnn Hai, MD;  Location: [place];  Service: Orthopedics;  Laterality: Right;  . Amputation Right 11/02/2013    Procedure: AMPUTATION BELOW KNEE;  Surgeon: Newt Minion, MD;  Location: [place];  Service: Orthopedics;  Laterality: Right;  Right Below Knee Amputation   Social History:   reports that she has been smoking Cigarettes.  She has a 10 pack-year smoking history. She has never used smokeless tobacco. She reports that she drinks about 2.4 oz of alcohol per week. She reports that she does not use illicit drugs.  Family History  Problem Relation Age of Onset  . Pneumonia Mother   . Gallbladder disease Mother     cancer  . Heart failure Mother   . Diabetes Father   . Coronary artery disease Father   . Stroke Neg Hx     Medications: Patient's Medications  New Prescriptions   No medications on file  Previous Medications   DOCUSATE SODIUM 100 MG CAPS    Take 100 mg by mouth 2 (two) times daily.   HYDRALAZINE (APRESOLINE) 25 MG TABLET    Take 25 mg by mouth 4 (four) times daily.   HYDROCHLOROTHIAZIDE (HYDRODIURIL) 25 MG TABLET    Take 25 mg by mouth every morning.   INSULIN ASPART (NOVOLOG) 100 UNIT/ML INJECTION    Inject 0-15 Units into the skin 3 (three) times daily with meals.   LISINOPRIL (PRINIVIL,ZESTRIL) 20 MG TABLET    Take 1 tablet (20 mg total) by mouth daily.   OXYCODONE-ACETAMINOPHEN (PERCOCET/ROXICET) 5-325 MG PER TABLET    Take 1-2 tablets by mouth  every 4 (four) hours as needed for moderate pain or severe pain.   POLYETHYLENE GLYCOL (MIRALAX / GLYCOLAX) PACKET    Take 17 g by mouth daily as needed for moderate constipation.   SIMVASTATIN (ZOCOR) 80 MG TABLET    Take 1 tablet (80 mg total) by mouth every evening.  Modified Medications   No medications on file  Discontinued Medications   IBUPROFEN (ADVIL,MOTRIN) 800 MG TABLET    Take 800 mg by mouth every 8 (eight) hours as needed for moderate pain.   INSULIN GLARGINE (LANTUS) 100 UNIT/ML INJECTION    Inject 25 Units into the skin at bedtime.   METHOCARBAMOL (ROBAXIN) 500 MG TABLET  Take 1 tablet (500 mg total) by mouth every 6 (six) hours as needed for muscle spasms.     Physical Exam: Filed Vitals:   12/27/14 1101  BP: 135/59  Pulse: 89  Temp: 98 F (36.7 C)  Resp: 18    Physical Exam  Constitutional: She is oriented to person, place, and time. No distress.  HENT:  Head: Normocephalic and atraumatic.  Mouth/Throat: Oropharynx is clear and moist. No oropharyngeal exudate.  Eyes: Conjunctivae are normal. Pupils are equal, round, and reactive to light.  Neck: Normal range of motion. Neck supple.  Cardiovascular: Normal rate, regular rhythm and normal heart sounds.   Pulmonary/Chest: Effort normal and breath sounds normal.  Abdominal: Soft. Bowel sounds are normal.  Musculoskeletal: She exhibits no edema or tenderness.  Bilateral BKA  Neurological: She is alert and oriented to person, place, and time.  Skin: Skin is warm and dry. She is not diaphoretic.  Psychiatric: She has a normal mood and affect.    Labs reviewed: Basic Metabolic Panel: No results for input(s): NA, K, CL, CO2, GLUCOSE, BUN, CREATININE, CALCIUM, MG, PHOS in the last 8760 hours. Liver Function Tests: No results for input(s): AST, ALT, ALKPHOS, BILITOT, PROT, ALBUMIN in the last 8760 hours. No results for input(s): LIPASE, AMYLASE in the last 8760 hours. No results for input(s): AMMONIA in the last  8760 hours. CBC: No results for input(s): WBC, NEUTROABS, HGB, HCT, MCV, PLT in the last 8760 hours. TSH: No results for input(s): TSH in the last 8760 hours. A1C: Lab Results  Component Value Date   HGBA1C 7.3* 10/31/2013   Lipid Panel: No results for input(s): CHOL, HDL, LDLCALC, TRIG, CHOLHDL, LDLDIRECT in the last 8760 hours.   Assessment/Plan 1. DM type 2, uncontrolled, with neuropathy Good PO intake, blood sugars in the high 100-200s fasting, remains on SSI only. Pt reports previously being on long acting insulin. No hypoglycemia noted. Will start lantus 10 units qhs and cont SSI for better control. Will follow up A1c  2. Hyperlipidemia Will follow up lipid profile in AM, conts on zocor  3. Essential hypertension -blood pressure controlled, conts on lisinipril, HCTZ, and hydralazine.  -follow up CMP  4. Constipation Controlled on current regimen   5. S/P bilateral BKA (below knee amputation) -working with therapy while at [employer]. -pain controlled with percocet as needed     Jessica K. Harle Battiest  [facility] [phone] 8 am - 5 pm) [phone] (after hours)

## 2014-12-29 LAB — BASIC METABOLIC PANEL
BUN: 26 mg/dL — AB (ref 4–21)
Creatinine: 0.9 mg/dL (ref <=1.1)
Glucose: 216 mg/dL
Sodium: 141 mmol/L (ref 137–147)

## 2014-12-29 LAB — HEMOGLOBIN A1C: Hemoglobin A1C: 7.8

## 2014-12-29 LAB — HEPATIC FUNCTION PANEL
BILIRUBIN DIRECT: 0.1 mg/dL
BILIRUBIN, TOTAL: 0.3 mg/dL

## 2014-12-29 LAB — CBC AND DIFFERENTIAL: WBC: 13 10^3/mL

## 2014-12-31 ENCOUNTER — Encounter (HOSPITAL_COMMUNITY): Payer: Self-pay | Admitting: Emergency Medicine

## 2014-12-31 ENCOUNTER — Emergency Department (HOSPITAL_COMMUNITY)
Admission: EM | Admit: 2014-12-31 | Discharge: 2015-01-01 | Disposition: A | Discharge status: Home / Self Care | Payer: Medicaid Other | Attending: Emergency Medicine | Admitting: Emergency Medicine

## 2014-12-31 DIAGNOSIS — E119 Type 2 diabetes mellitus without complications: Secondary | ICD-10-CM | POA: Diagnosis not present

## 2014-12-31 DIAGNOSIS — Z720 Tobacco use: Secondary | ICD-10-CM | POA: Insufficient documentation

## 2014-12-31 DIAGNOSIS — N390 Urinary tract infection, site not specified: Secondary | ICD-10-CM | POA: Diagnosis not present

## 2014-12-31 DIAGNOSIS — Z79899 Other long term (current) drug therapy: Secondary | ICD-10-CM | POA: Insufficient documentation

## 2014-12-31 DIAGNOSIS — E785 Hyperlipidemia, unspecified: Secondary | ICD-10-CM | POA: Insufficient documentation

## 2014-12-31 DIAGNOSIS — Z8673 Personal history of transient ischemic attack (TIA), and cerebral infarction without residual deficits: Secondary | ICD-10-CM | POA: Insufficient documentation

## 2014-12-31 DIAGNOSIS — Z8669 Personal history of other diseases of the nervous system and sense organs: Secondary | ICD-10-CM | POA: Insufficient documentation

## 2014-12-31 DIAGNOSIS — I10 Essential (primary) hypertension: Secondary | ICD-10-CM | POA: Diagnosis not present

## 2014-12-31 DIAGNOSIS — Z872 Personal history of diseases of the skin and subcutaneous tissue: Secondary | ICD-10-CM | POA: Diagnosis not present

## 2014-12-31 DIAGNOSIS — R7989 Other specified abnormal findings of blood chemistry: Secondary | ICD-10-CM | POA: Diagnosis present

## 2014-12-31 DIAGNOSIS — Z880 Allergy status to penicillin: Secondary | ICD-10-CM | POA: Insufficient documentation

## 2014-12-31 DIAGNOSIS — K5900 Constipation, unspecified: Secondary | ICD-10-CM | POA: Diagnosis not present

## 2014-12-31 LAB — URINALYSIS, ROUTINE W REFLEX MICROSCOPIC
Bilirubin Urine: NEGATIVE
Glucose, UA: NEGATIVE mg/dL
Ketones, ur: NEGATIVE mg/dL
NITRITE: POSITIVE — AB
PROTEIN: 30 mg/dL — AB
Specific Gravity, Urine: 1.025 (ref 1.005–1.030)
UROBILINOGEN UA: 0.2 mg/dL (ref 0.0–1.0)
pH: 7.5 (ref 5.0–8.0)

## 2014-12-31 LAB — CBC WITH DIFFERENTIAL/PLATELET
BASOS PCT: 0 % (ref 0–1)
Basophils Absolute: 0 10*3/uL (ref 0.0–0.1)
EOS PCT: 2 % (ref 0–5)
Eosinophils Absolute: 0.2 10*3/uL (ref 0.0–0.7)
HCT: 33.9 % — ABNORMAL LOW (ref 36.0–46.0)
Hemoglobin: 10.7 g/dL — ABNORMAL LOW (ref 12.0–15.0)
Lymphocytes Relative: 25 % (ref 12–46)
Lymphs Abs: 2.7 10*3/uL (ref 0.7–4.0)
MCH: 26.4 pg (ref 26.0–34.0)
MCHC: 31.6 g/dL (ref 30.0–36.0)
MCV: 83.7 fL (ref 78.0–100.0)
Monocytes Absolute: 0.5 10*3/uL (ref 0.1–1.0)
Monocytes Relative: 4 % (ref 3–12)
NEUTROS PCT: 69 % (ref 43–77)
Neutro Abs: 7.6 10*3/uL (ref 1.7–7.7)
Platelets: 239 10*3/uL (ref 150–400)
RBC: 4.05 MIL/uL (ref 3.87–5.11)
RDW: 14.7 % (ref 11.5–15.5)
WBC: 11.1 10*3/uL — ABNORMAL HIGH (ref 4.0–10.5)

## 2014-12-31 LAB — COMPREHENSIVE METABOLIC PANEL
ALK PHOS: 146 U/L — AB (ref 38–126)
ALT: 19 U/L (ref 14–54)
AST: 15 U/L (ref 15–41)
Albumin: 3 g/dL — ABNORMAL LOW (ref 3.5–5.0)
Anion gap: 9 (ref 5–15)
BILIRUBIN TOTAL: 0.2 mg/dL — AB (ref 0.3–1.2)
BUN: 30 mg/dL — ABNORMAL HIGH (ref 6–20)
CO2: 25 mmol/L (ref 22–32)
CREATININE: 0.88 mg/dL (ref 0.44–1.00)
Calcium: 8.7 mg/dL — ABNORMAL LOW (ref 8.9–10.3)
Chloride: 104 mmol/L (ref 101–111)
GFR calc Af Amer: >60 mL/min (ref >60)
GFR calc non Af Amer: >60 mL/min (ref >60)
GLUCOSE: 216 mg/dL — AB (ref 65–99)
Potassium: 4.3 mmol/L (ref 3.5–5.1)
Sodium: 138 mmol/L (ref 135–145)
TOTAL PROTEIN: 6.9 g/dL (ref 6.5–8.1)

## 2014-12-31 LAB — URINE MICROSCOPIC-ADD ON

## 2014-12-31 LAB — I-STAT CG4 LACTIC ACID, ED: Lactic Acid, Venous: 1.38 mmol/L (ref 0.5–2.0)

## 2014-12-31 MED ORDER — CEPHALEXIN 500 MG PO CAPS: 500.0000 mg | ORAL_CAPSULE | Freq: Two times a day (BID) | ORAL | Status: AC

## 2014-12-31 MED ORDER — CEPHALEXIN 250 MG PO CAPS
500.0000 mg | ORAL_CAPSULE | Freq: Once | ORAL | Status: AC
  Administered 2014-12-31: 500 mg via ORAL
  Filled 2014-12-31: qty 2

## 2014-12-31 NOTE — Discharge Instructions (Signed)
As discussed, your evaluation today has been largely reassuring.  But, it is important that you monitor your condition carefully, and do not hesitate to return to the ED if you develop new, or concerning changes in your condition.  Please take all antibiotics as prescribed for your urinary tract infection  Otherwise, please follow-up with your physician for appropriate ongoing care.

## 2014-12-31 NOTE — ED Notes (Signed)
Pt was sent to ED by [facility] due to abnormal labs, including WBC (13) and Hemoglobin (10.2).  Pt reports that she is feeling OK, just a little sleepy.

## 2014-12-31 NOTE — ED Provider Notes (Signed)
CSN: [id number]     Arrival date & time 12/31/14  1809 History   First MD Initiated Contact with Patient 12/31/14 1820     Chief Complaint  Patient presents with  . Abnormal Lab    HPI  Patient presents from her nursing facility with minimal personal complaints. She states that she feels "okay"denies recent illness, denies current focal change from baseline condition. She is a resident of her facility due to history of diabetes, and is now status post bilateral below the knee amputation. Per nursing home report the patient was found to have mild leukocytosis, mild anemia earlier today. The patient herself denies any rectal bleeding, hematemesis, syncope, chest pain. No new medication, per report.   Past Medical History  Diagnosis Date  . Atypical chest pain     a. non obstructive by cath in 2008 and 2010;  b. 02/2012 Myoview: non-ischemic, EF 57%  . HTN (hypertension)   . Hyperlipidemia   . Syncope and collapse     a. near-syncopal episode in November 2008;  b. 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  . CVA (cerebral infarction)     a.  Small right parietal noted incidentally 04/2007;  b. right sided embolic CVA 08/6960;  c. 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 Peripheral neuropathy   . Obesity, unspecified   . Esophageal reflux   . Diabetes mellitus, type II   . Diaphragmatic hernia without mention of obstruction or gangrene   . Irritable bowel syndrome   . Morbid obesity   . Cellulitis     a. 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 Stroke     2013-'no residual'  . Vertigo   . Dry skin   . Constipation   . Tobacco abuse    Past Surgical History  Procedure Laterality Date  . Invasive electrophysiologic study  5/09    followed by insertion of an implantable loop recorder. S/p removal   . Colonoscopy    . Cholecystectomy    . Multiple toe surgeries    . Cardiac catheterization       LAD 30%, circumflex 50%, OM 75%, RI 60% with small branch 80%, dominant RCA 60%, EF 45-50%  . I&d extremity  12/23/2011    Procedure: IRRIGATION AND DEBRIDEMENT EXTREMITY;  Surgeon: Augustin Schooling, MD;  Location: [place];  Service: Orthopedics;  Laterality: Left;  Left Foot  . I&d extremity  12/26/2011    Procedure: IRRIGATION AND DEBRIDEMENT EXTREMITY;  Surgeon: Wylene Simmer, MD;  Location: [place];  Service: Orthopedics;  Laterality: Left;  LEFT FOOT I&D WITH POSSIBLE WOUND VAC APPLICATION, POSSIBLE LEFT FIRST RAY AMPUTATION  . Amputation  12/30/2011    Procedure: AMPUTATION RAY;  Surgeon: Wylene Simmer, MD;  Location: [place];  Service: Orthopedics;  Laterality: Left;  LEFT FIRST RAY AMPUTATION  . Eye surgery      cataract  . Amputation  01/13/2012    Procedure: AMPUTATION BELOW KNEE;  Surgeon: Wylene Simmer, MD;  Location: [place];  Service: Orthopedics;  Laterality: Left;  . Amputation  03/04/2012    Procedure: AMPUTATION BELOW KNEE;  Surgeon: Wylene Simmer, MD;  Location: [place];  Service: Orthopedics;  Laterality: Left;  Revision of Left Below Knee Amputation  . Tee without cardioversion N/A 07/07/2012    Procedure: TRANSESOPHAGEAL ECHOCARDIOGRAM (TEE);  Surgeon: Lelon Perla, MD;  Location: [facility] ENDOSCOPY;  Service:  Cardiovascular;  Laterality: N/A;  . I&d extremity Right 10/29/2013    Procedure: IRRIGATION AND DEBRIDEMENT EXTREMITY;  Surgeon: Johnn Hai, MD;  Location: [place];  Service: Orthopedics;  Laterality: Right;  . Amputation Right 11/02/2013    Procedure: AMPUTATION BELOW KNEE;  Surgeon: Newt Minion, MD;  Location: [place];  Service: Orthopedics;  Laterality: Right;  Right Below Knee Amputation   Family History  Problem Relation Age of Onset  . Pneumonia Mother   . Gallbladder disease Mother     cancer  . Heart failure Mother   . Diabetes Father   . Coronary artery disease Father   . Stroke Neg Hx    Social History  Substance Use Topics  . Smoking status: Current Every Day Smoker  -- 0.25 packs/day for 40 years    Types: Cigarettes  . Smokeless tobacco: Never Used  . 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   OB History    No data available     Review of Systems  Constitutional: Positive for fatigue.       Per HPI, otherwise negative  HENT:       Per HPI, otherwise negative  Respiratory:       Per HPI, otherwise negative  Cardiovascular:       Per HPI, otherwise negative  Gastrointestinal: Negative for vomiting.  Endocrine:       Negative aside from HPI  Genitourinary:       Neg aside from HPI   Musculoskeletal:       Per HPI, otherwise negative  Skin: Negative.   Neurological: Negative for syncope.      Allergies  Banana; Penicillins; and Strawberry  Home Medications   Prior to Admission medications   Medication Sig Start Date End Date Taking? Authorizing Provider  docusate sodium 100 MG CAPS Take 100 mg by mouth 2 (two) times daily. 11/04/13   Debbe Odea, MD  hydrALAZINE (APRESOLINE) 25 MG tablet Take 25 mg by mouth 4 (four) times daily.    Historical Provider, MD  hydrochlorothiazide (HYDRODIURIL) 25 MG tablet Take 25 mg by mouth every morning.    Historical Provider, MD  insulin aspart (NOVOLOG) 100 UNIT/ML injection Inject 0-15 Units into the skin 3 (three) times daily with meals. 11/04/13   Debbe Odea, MD  lisinopril (PRINIVIL,ZESTRIL) 20 MG tablet Take 1 tablet (20 mg total) by mouth daily. 01/21/13   Lavon Paganini Angiulli, PA-C  oxyCODONE-acetaminophen (PERCOCET/ROXICET) 5-325 MG per tablet Take 1-2 tablets by mouth every 4 (four) hours as needed for moderate pain or severe pain. 11/04/13   Debbe Odea, MD  polyethylene glycol (MIRALAX / GLYCOLAX) packet Take 17 g by mouth daily as needed for moderate constipation. 11/04/13   Debbe Odea, MD  simvastatin (ZOCOR) 80 MG tablet Take 1 tablet (80 mg total) by mouth every evening. 01/21/13   Lavon Paganini Angiulli, PA-C    BP 153/66 mmHg  Pulse 77  Temp(Src) 98.5 F (36.9 C) (Oral)  Resp 12  SpO2 99% Physical Exam  Constitutional: She is oriented to person, place, and time. She appears well-developed and well-nourished. No distress.  HENT:  Head: Normocephalic and atraumatic.  Eyes: Conjunctivae and EOM are normal.  Cardiovascular: Normal rate and regular rhythm.   Pulmonary/Chest: Effort normal and breath sounds normal. No stridor. No respiratory distress.  Abdominal: She exhibits no distension.  Musculoskeletal:  She exhibits no edema.       Legs: Neurological: She is alert and oriented to person, place, and time. No cranial nerve deficit.  Skin: Skin is warm and dry.  Psychiatric: She has a normal mood and affect.  Nursing note and vitals reviewed.   ED Course  Procedures (including critical care time) Labs Review Labs Reviewed  COMPREHENSIVE METABOLIC PANEL - Abnormal; Notable for the following:    Glucose, Bld 216 (*)    BUN 30 (*)    Calcium 8.7 (*)    Albumin 3.0 (*)    Alkaline Phosphatase 146 (*)    Total Bilirubin 0.2 (*)    All other components within normal limits  CBC WITH DIFFERENTIAL/PLATELET - Abnormal; Notable for the following:    WBC 11.1 (*)    Hemoglobin 10.7 (*)    HCT 33.9 (*)    All other components within normal limits  URINALYSIS, ROUTINE W REFLEX MICROSCOPIC (NOT AT [facility]) - Abnormal; Notable for the following:    APPearance CLOUDY (*)    Hgb urine dipstick MODERATE (*)    Protein, ur 30 (*)    Nitrite POSITIVE (*)    Leukocytes, UA MODERATE (*)    All other components within normal limits  URINE MICROSCOPIC-ADD ON - Abnormal; Notable for the following:    Squamous Epithelial / LPF MANY (*)    Bacteria, UA MANY (*)    All other components within normal limits  I-STAT CG4 LACTIC ACID, ED    Imaging Review No results found. I, Trenesha Alcaide, personally reviewed and evaluated these images and lab results as part of my medical decision-making.  On  repeat exam the patient is in no distress. She is here with multiple family members, and we discussed all findings. There is evidence for urinary tract infection, but no evidence for bacteremia or sepsis. Patient has intermittent back pain, and though this is likely musculoskeletal, she will receive a slightly longer course of antibiotics for possible early Pyelonephritis  MDM   nursing home resident presents with staff concerns of mild lab abnormalities.  Patient is essentially without complaints, is awake, alert, hemodynamically stable.  Labs here are reassuring, patient discharged in stable condition.   Carmin Muskrat, MD 12/31/14 2037

## 2015-01-02 ENCOUNTER — Non-Acute Institutional Stay (SKILLED_NURSING_FACILITY): Payer: Medicaid Other | Admitting: Internal Medicine

## 2015-01-02 DIAGNOSIS — I10 Essential (primary) hypertension: Secondary | ICD-10-CM

## 2015-01-02 DIAGNOSIS — E785 Hyperlipidemia, unspecified: Secondary | ICD-10-CM

## 2015-01-02 DIAGNOSIS — E0851 Diabetes mellitus due to underlying condition with diabetic peripheral angiopathy without gangrene: Secondary | ICD-10-CM

## 2015-01-02 DIAGNOSIS — I2510 Atherosclerotic heart disease of native coronary artery without angina pectoris: Secondary | ICD-10-CM

## 2015-01-03 NOTE — Progress Notes (Addendum)
Patient ID: [name], female   DOB: 27-Jun-1955, 59 y.o.   MRN: [id number]                HISTORY & PHYSICAL  DATE:  01/02/2015         FACILITY: [facility]                  LEVEL OF CARE:   SNF   CHIEF COMPLAINT:  Admission to the facility, post transfer from [facility].    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  I think she was discharged to an SNF at that point.  The patient tells me she has been sent here to be closer to family, which includes a sister and a niece.  She had previously had a left below-knee amputation in 2013.    The patient is a type 2 diabetic who has been on longstanding insulin.  Interestingly, she came here only on a sliding scale insulin.  I note that our practitioner, who did her admission last week, started her on Lantus 10 U subcu q.h.s.  Correctly ordered lipid panel as well as an A1c.    The patient was already sent to the emergency room on 12/31/2014.  I am not completely certain of the issue.  The transfer order says due to increased white count.  By the time she got to the ER, her white count was 11.1 with 69% neutrophils.  The patient states that she had a "UTI" and she was sent back on Keflex.  I cannot see that she had a urine culture.  Her urinalysis shows many bacteria, negative glucose, negative protein, white blood cells 0-2, positive nitrite.  The patient states she feels well.  She is not complaining of dysuria or flank pain.    PAST MEDICAL HISTORY/PROBLEM LIST:                 Atypical chest pain with a cardiac catheterization in 2008 showing nonobstructive disease.  This was also repeated in 2010.    Hypertension.    Hyperlipidemia.    History of syncope in 2008.    CVA, discovered by an incidental imaging study.     Diabetic peripheral neuropathy.    Obesity.    Esophageal reflux.     Diaphragmatic hernia.     Irritable bowel syndrome.    Vertigo.    Constipation.    PAST SURGICAL HISTORY:             Colonoscopy, although I do not see the date.  The patient states 6-7 years ago.    Cholecystectomy.     Cardiac catheterization revealing 30% LAD, 50% circumflex.    Multiple procedures in 2013 for I&D of presumably lower extremity wounds, and in 2015 for the same on the right.    She had a left BKA in 2013 and a right in 2015.    CURRENT MEDICATIONS:  Medication list is reviewed.            Lantus 10 U subcu q.h.s.     Oxycodone/acetaminophen 5/325, 1-2 q.4 p.r.n.     Simvastatin 80 q.d.       Divalproex 125 b.i.d.      Colace 100 b.i.d.      Hydralazine 25 q.i.d.     Hydrochlorothiazide 25 q.d.  Lisinopril 25 q.d.      Loperamide 2 mg daily p.r.n.      MiraLAX 17 g daily p.r.n.    novolog sliding scale     SOCIAL HISTORY:                   HOUSING:  The patient has been in [facility], I think for the last year.   TOBACCO USE:  She was smoking up until a month ago (states this was a smoking facility).   ADVANCED DIRECTIVES:  There are no advanced directives on the chart.    FAMILY HISTORY:       MOTHER:  Heart failure.  Mother is deceased.    FATHER:  Diabetes and coronary artery disease.  Father is alive.   SIBLINGS:  Brother and sister are alive.    REVIEW OF SYSTEMS:            GENERAL:  The patient has not had any weight loss.   HEENT:  States she does not have diabetic retinal disease.  States her vision is stable.   CHEST/RESPIRATORY:  No cough.  No sputum.       CARDIAC:  No current complaints of chest pain.    GI:  No nausea, vomiting, or diarrhea.  No dysphagia.    GU:   Episodic urinary incontinence, without dysuria.    MUSCULOSKELETAL:  Extremities:  She states she does not have any pain, but does have a spur on her left stump.  She has a prosthesis for one of her legs, although it does not sound like  she uses it.  She is a Merchandiser, retail.   NEUROLOGICAL:  She does not complain of numbness or tingling in her extremities.   BREASTS:   States she has not had a mammogram in 5-6 years.   Endocrine: CBG's have already been noted to be elvated on only  PHYSICAL EXAMINATION:   VITAL SIGNS:     PULSE:  90.   RESPIRATIONS:  20.   02 SATURATIONS:  97% on room air.   GENERAL APPEARANCE:  The patient is not in any distress.   Bright and alert.   HEENT:   MOUTH/THROAT:    Oral exam is normal.   EYES:  She has no obvious cataracts.   LYMPHATICS:  None palpable in the cervical, clavicular, or axillary areas.    BREASTS:   Exam deferred.  I am going to send her for a mammogram.     CHEST/RESPIRATORY:  Clear air entry bilaterally.    No crackles or wheezing.  No clubbing.   CARDIOVASCULAR:   CARDIAC:  Heart sounds are normal.  There is a soft midsystolic murmur.  There are no carotid bruits.  No gallops.  Echocardiogram in 2014 showed an EF of 59-93%, grade 1 diastolic dysfunction, no valvular disease.     GASTROINTESTINAL:   ABDOMEN:  Large cholecystectomy scar.  No masses.    LIVER/SPLEEN/KIDNEY:   No liver, no spleen.  No tenderness.    GENITOURINARY:   BLADDER:  Not distended.   There is no tenderness.  No CVA tenderness is noted.  She does complain of urinary incontinence.     MUSCULOSKELETAL:    EXTREMITIES:   BILATERAL LOWER EXTREMITIES:  Bilateral BKAs.  There is a spur at the stump area, but no open areas in the left leg.   Otherwise, her stumps appear to be intact.  She probably has some degree of flexion contracture  of the left knee.   CIRCULATION:  She has no pulses palpable in either leg.  I cannot even feel femoral pulses on either side.   NEUROLOGICAL:   SENSATION/STRENGTH:  She has antigravity strength at both her legs.   DEEP TENDON REFLEXES:  She is diffusely hyporeflexic.      PSYCHIATRIC:   MENTAL STATUS:    I see no abnormalities here.  No evidence of depression.     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  She has had Lantus insulin added.  Her blood sugars are running in the 200s.  I think this is appropriate.  She is not on aspirin.  If she does not have an allergy, I will start this.  Her creatinine in the hospital on 12/31/2014 was 0.88, estimated GFR over 60.  No protein in her urine.      Hyperlipidemia.  She is on atorvastatin 40 q.d.  A panel has been ordered.     Hypertension.  On Lisinopril, hydrochlorothiazide, and hydralazine 25 q.i.d.  This appears to be stable.    Peripheral vascular disease.  This is secondary to her diabetes.  I am going to add aspirin.  She seems otherwise asymptomatic.    On Depakote Sprinkles for reasons that are not clear.  If this cannot be clarified, then I think this can be discontinued.    History of nonobstructive coronary artery disease by cardiac cath in 2008, 2010.  There are no issues here that I can elicit at the bedside.  Her exam is normal.  She does not have symptoms.    Grade 1 diastolic dysfunction.  No evidence of heart failure.    Recent trip to the ER for a UTI, apparently with an elevated white count  although this was only 11.1 by the time she arrived at the hospital.  She was put on Keflex without a culture.  Positive nitrite can mean Pseudomonas.  However, she remains completely asymptomatic.    HEALTH MAINTENANCE:    The patient will need to be seen by the optometrist here.    She needs a mammogram.    By her description, she has had a colonoscopy in the past.  I will need to see if I can find this to have a sense of when she needs to return.   Her hemoglobin was 10.7, although it seems that she has chronic anemia.    As she is coming from a facility, presumably she has had her influenza and Pneumovax updated.

## 2015-01-08 ENCOUNTER — Other Ambulatory Visit: Payer: Self-pay | Admitting: *Deleted

## 2015-01-08 MED ORDER — OXYCODONE-ACETAMINOPHEN 5-325 MG PO TABS: ORAL_TABLET | ORAL | Status: DC

## 2015-01-08 NOTE — Telephone Encounter (Signed)
[facility] 

## 2015-01-16 ENCOUNTER — Non-Acute Institutional Stay (SKILLED_NURSING_FACILITY): Payer: Medicaid Other | Admitting: Internal Medicine

## 2015-01-16 DIAGNOSIS — E1140 Type 2 diabetes mellitus with diabetic neuropathy, unspecified: Secondary | ICD-10-CM | POA: Diagnosis not present

## 2015-01-16 DIAGNOSIS — IMO0002 Reserved for concepts with insufficient information to code with codable children: Secondary | ICD-10-CM

## 2015-01-16 DIAGNOSIS — E1165 Type 2 diabetes mellitus with hyperglycemia: Secondary | ICD-10-CM

## 2015-01-16 DIAGNOSIS — Z89511 Acquired absence of right leg below knee: Secondary | ICD-10-CM | POA: Diagnosis not present

## 2015-01-16 DIAGNOSIS — I10 Essential (primary) hypertension: Secondary | ICD-10-CM | POA: Diagnosis not present

## 2015-01-16 DIAGNOSIS — E0851 Diabetes mellitus due to underlying condition with diabetic peripheral angiopathy without gangrene: Secondary | ICD-10-CM

## 2015-01-16 DIAGNOSIS — Z89512 Acquired absence of left leg below knee: Secondary | ICD-10-CM | POA: Diagnosis not present

## 2015-01-16 NOTE — Progress Notes (Signed)
Patient ID: [name], female   DOB: 1955/09/02, 59 y.o.   MRN: [id number] Facility; [facility] Chief complaint; hyperglycemia and swelling in her legs. History; this is a patient I admitted to the facility transferred from another facility. She has had previous bilateral BKA's. She came here only on sliding scale insulin which was a bit surprising. Since her arrival here I have added her back on insulin currently at 15 units I believe for. She has been on this since 8/17. In spite of this her blood sugars remain under marginal control the rest of the day;  On 8/28 143/213/242/195  On 8/29 144/417/239/185  There is also a note from 8/26 that reports increasing swelling to both of her legs per orthopedics [Dr. Hewitt]. Apparently she could not get her prosthesis on.  Past Medical History  Diagnosis Date  . Atypical chest pain     a. non obstructive by cath in 2008 and 2010;  b. 02/2012 Myoview: non-ischemic, EF 57%  . HTN (hypertension)   . Hyperlipidemia   . Syncope and collapse     a. near-syncopal episode in November 2008;  b. 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  . CVA (cerebral infarction)     a.  Small right parietal noted incidentally 04/2007;  b. right sided embolic CVA 09/6211;  c. 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 Peripheral neuropathy   . Obesity, unspecified   . Esophageal reflux   . Diabetes mellitus, type II   . Diaphragmatic hernia without mention of obstruction or gangrene   . Irritable bowel syndrome   . Morbid obesity   . Cellulitis     a. 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 Stroke     2013-'no residual'  . Vertigo   . Dry skin   . Constipation   . Tobacco abuse    Past Surgical History  Procedure Laterality Date  . Invasive electrophysiologic study  5/09    followed by insertion of an implantable loop recorder. S/p removal   . Colonoscopy    .  Cholecystectomy    . Multiple toe surgeries    . Cardiac catheterization      LAD 30%, circumflex 50%, OM 75%, RI 60% with small branch 80%, dominant RCA 60%, EF 45-50%  . I&d extremity  12/23/2011    Procedure: IRRIGATION AND DEBRIDEMENT EXTREMITY;  Surgeon: Augustin Schooling, MD;  Location: [place];  Service: Orthopedics;  Laterality: Left;  Left Foot  . I&d extremity  12/26/2011    Procedure: IRRIGATION AND DEBRIDEMENT EXTREMITY;  Surgeon: Wylene Simmer, MD;  Location: [place];  Service: Orthopedics;  Laterality: Left;  LEFT FOOT I&D WITH POSSIBLE WOUND VAC APPLICATION, POSSIBLE LEFT FIRST RAY AMPUTATION  . Amputation  12/30/2011    Procedure: AMPUTATION RAY;  Surgeon: Wylene Simmer, MD;  Location: [place];  Service: Orthopedics;  Laterality: Left;  LEFT FIRST RAY AMPUTATION  . Eye surgery      cataract  . Amputation  01/13/2012    Procedure: AMPUTATION BELOW KNEE;  Surgeon: Wylene Simmer, MD;  Location: [place];  Service: Orthopedics;  Laterality: Left;  . Amputation  03/04/2012    Procedure: AMPUTATION BELOW KNEE;  Surgeon: Wylene Simmer, MD;  Location: [place];  Service: Orthopedics;  Laterality: Left;  Revision of Left Below Knee Amputation  . Tee without cardioversion N/A 07/07/2012    Procedure:  TRANSESOPHAGEAL ECHOCARDIOGRAM (TEE);  Surgeon: Lelon Perla, MD;  Location: [facility] ENDOSCOPY;  Service: Cardiovascular;  Laterality: N/A;  . I&d extremity Right 10/29/2013    Procedure: [id number] AND DEBRIDEMENT EXTREMITY;  Surgeon: Johnn Hai, MD;  Location: [place];  Service: Orthopedics;  Laterality: Right;  . Amputation Right 11/02/2013    Procedure: AMPUTATION BELOW KNEE;  Surgeon: Newt Minion, MD;  Location: [place];  Service: Orthopedics;  Laterality: Right;  Right Below Knee Amputation    Current medications Lantus 15 units subcutaneous daily at bedtime brackets patient states that one point she was on 25 units daily at bedtim Hydrochlorothiazide 25 daily Depakote 125 2 capsules at at bedtime Colace 100  twice a day Atorvastatin 40 daily Percocet 5/325 2 tablets every 4 when necessary Loperamide 2 tablets every 4 when necessary MiraLAX 17 g daily when necessary lisinopril 20 daily Hydralazine 25 4 times a day Insulin sliding scale starting at 200  ending at 400  Review of systems Gen. patient states she feels well no fever no chills Respiratory no cough no sputum Cardiac no chest pain GI no abdominal pain nausea vomiting diarrhea GU no dysuria Extremities patient has not noticed any edema Endocrine; no polyuria  Physical examination Gen. the patient is not in any distress Vitals; blood pressure 118/72 pulse 64 temperature 97.9 respirations 18 HEENT oral exam is normal Respiratory shallow but clear air entry bilaterally cardiac heart sounds are normal JVP is not elevated there is no coccyx edema Abdomen; obese no liver no spleen no masses GU; bladder not distended there is no CVA tenderness. Extremities; careful evaluation of her bilateral stumps from her previous BKA's reveals no edema and no tenderness. Neurologic; diffusely hyporeflexic compatible with known diabetic neuropathy  Impression/plan #1 poorly controlled type 2 diabetes with macrovascular disease and neuropathy. I'm going to increase her Lantus insulin to 20 units. I am going to take her off her sliding scale and initiate a routine ACE meals dose of NovoLog. #2 edema in her stumps. I really don't see any edema here. But I do note her weight is up 8 pounds since she was here. #3 on Depakote the. The reason behind this isn't really clear I don't really see a major psychiatric issue here. I wonder whether this can be withdrawn Hyperlipidemia on atorvastatin 40 #4 I started her on aspirin  Lab work from 8/10 should already be in this system. Comprehensive metabolic panel was normal except for an albumen of 3.2 LDL was 67 hemoglobin A1c of 7.8. She should be screened for microalbuminuria she is not on an ACE inhibitor and  again I am not really sure why.

## 2015-02-27 ENCOUNTER — Non-Acute Institutional Stay (SKILLED_NURSING_FACILITY): Payer: Medicaid Other | Admitting: Internal Medicine

## 2015-02-27 DIAGNOSIS — E1165 Type 2 diabetes mellitus with hyperglycemia: Secondary | ICD-10-CM | POA: Diagnosis not present

## 2015-02-27 DIAGNOSIS — I10 Essential (primary) hypertension: Secondary | ICD-10-CM

## 2015-02-27 DIAGNOSIS — E0851 Diabetes mellitus due to underlying condition with diabetic peripheral angiopathy without gangrene: Secondary | ICD-10-CM

## 2015-02-27 DIAGNOSIS — E785 Hyperlipidemia, unspecified: Secondary | ICD-10-CM | POA: Diagnosis not present

## 2015-02-27 DIAGNOSIS — E1140 Type 2 diabetes mellitus with diabetic neuropathy, unspecified: Secondary | ICD-10-CM

## 2015-02-27 DIAGNOSIS — IMO0002 Reserved for concepts with insufficient information to code with codable children: Secondary | ICD-10-CM

## 2015-02-27 DIAGNOSIS — Z794 Long term (current) use of insulin: Secondary | ICD-10-CM

## 2015-03-05 NOTE — Progress Notes (Addendum)
Patient ID: [name], female   DOB: 1955/12/23, 59 y.o.   MRN: [id number]                PROGRESS NOTE  DATE:  02/27/2015         FACILITY: [facility]              LEVEL OF CARE:   SNF   Routine Visit            CHIEF COMPLAINT:  Routine visit to follow diabetes, other medical issues.      HISTORY OF PRESENT ILLNESS:  This is a patient who was admitted to the building in August.   She came here from another nursing home.     She has had bilateral below-knee amputations due to gangrenous diabetic foot ulcers with the most recent one in June 2015 on the right.    She is a type 2 diabetic who had been on longstanding insulin.  Interestingly, she came here only on a sliding scale.  I note that we have put her back on Lantus insulin 10 U.  Her CBGs, however, are not very well controlled in the high 100s fasting, the high 200s a.c. lunch, and over 300 later in the day.  Her hemoglobin A1c done recently was 8.5.    PAST MEDICAL HISTORY/PROBLEM LIST:           Atypical chest pain with a cardiac catheterization in 2008 showing nonobstructive disease.  This was also repeated in 2010.    Hypertension.    Hyperlipidemia.    History of syncope in 2008.    CVA, discovered by accidental imaging.    Diabetic peripheral neuropathy.    Obesity.    Esophageal reflux with a diaphragmatic hiatal hernia.    Irritable bowel syndrome.    History of vertigo.    History of constipation.     PAST SURGICAL HISTORY:               Colonoscopy.     Cholecystectomy.    Cardiac catheterization revealing nonobstructive disease.     Left BKA in 2013.     Right BKA in 2015.    SOCIAL HISTORY:      ADVANCED DIRECTIVES:  No advanced directives on the chart.    CURRENT MEDICATIONS:  Medication list is reviewed.              Hydrochlorothiazide 25 q.d.      Lipitor 40 q.d.      Zestril 20 q.d.      ASA 81 q.d.       Colace 100 q.d.      Apresoline 25 four times a day.     Depakote Sprinkles 2 capsules, 250 mg at bedtime only.      Lantus 20 U subcu q.h.s.      NovoLog 3 U a.c. meals.      REVIEW OF SYSTEMS:    GENERAL:  The patient has put on nine pounds, currently up to 229 pounds.     HEENT:   She has had drainage of the right eye, which is itchy.   BREASTS:  I did order a mammogram on 01/02/2015.      CHEST/RESPIRATORY:  No cough.  No sputum.    CARDIAC:  No chest pain.   GI:  No nausea, vomiting, or diarrhea.     GU:  Episodic urinary incontinence.   No dysuria.    MUSCULOSKELETAL:  Extremities:  She says she  has a spur on her left stump.  I believe she is going out to see a prosthetist.  She has a prosthesis for her left leg.    NEUROLOGICAL:  No complaints of weakness or numbness.    PHYSICAL EXAMINATION:   VITAL SIGNS:     TEMPERATURE:  97.9.    PULSE:  72.    RESPIRATIONS:  18.     BLOOD PRESSURE:  122/76.   02 SATURATIONS:  97%.     GENERAL APPEARANCE:  The patient is bright, alert, conversational.   HEENT:   EYES:   The right eye has some of what looks to be probably an allergic conjunctivitis.   MOUTH/THROAT:  Oral exam is normal.   LYMPHATICS:  None palpable in the cervical, clavicular, or axillary areas.   CHEST/RESPIRATORY:  Clear.     CARDIOVASCULAR:   CARDIAC:  Heart sounds are soft.  There are no murmurs.   No bruits.   GASTROINTESTINAL:   LIVER/SPLEEN/KIDNEYS:  No liver, no spleen.  No tenderness.     CIRCULATION:   EDEMA/VARICOSITIES:  Extremities:  She has no edema in her stumps.   MUSCULOSKELETAL:   EXTREMITIES:   LEFT LOWER EXTREMITY:  The left stump is beginning to develop a contracture.    NEUROLOGICAL:    SENSATION/STRENGTH:  She has antigravity strength in her arms and legs.    PSYCHIATRIC:   MENTAL STATUS:  I see no overt abnormalities.      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   This is not well controlled.   I am going to increase her Lantus to 30 U.   Further adjustments of the a.c. meals when I have control of the fasting blood sugar.     Hyperlipidemia.    This is well controlled.  Her LDL cholesterol was 67.     Hypoalbuminemia.   Probably reasonable to go ahead at some point and screen her for protein loss in her urine.   She does not have overt chronic renal failure.    Left knee flexion contracture.   I have shown her exercises to do in flexion and extension.  Contractures make it very difficult to use prosthetics properly.    I am going to increase this woman's Lantus to 30 U.    She is on Lisinopril, which is appropriate.     I am not really certain why she is on valproic acid.  I think this is probably some form of mood stabilizer.  I would like to probably try to taper this at some point when I have a bit more history.

## 2015-04-03 ENCOUNTER — Non-Acute Institutional Stay (SKILLED_NURSING_FACILITY): Payer: Medicaid Other | Admitting: Internal Medicine

## 2015-04-03 DIAGNOSIS — H1013 Acute atopic conjunctivitis, bilateral: Secondary | ICD-10-CM

## 2015-04-03 DIAGNOSIS — E0851 Diabetes mellitus due to underlying condition with diabetic peripheral angiopathy without gangrene: Secondary | ICD-10-CM

## 2015-04-03 DIAGNOSIS — E785 Hyperlipidemia, unspecified: Secondary | ICD-10-CM | POA: Diagnosis not present

## 2015-04-03 DIAGNOSIS — J309 Allergic rhinitis, unspecified: Principal | ICD-10-CM

## 2015-04-03 DIAGNOSIS — Z794 Long term (current) use of insulin: Secondary | ICD-10-CM

## 2015-04-08 NOTE — Progress Notes (Signed)
Patient ID: [name], female   DOB: 03-29-1956, 59 y.o.   MRN: [id number]                PROGRESS NOTE  DATE:  04/03/2015        FACILITY: [facility]              LEVEL OF CARE:   SNF   Acute Visit               CHIEF COMPLAINT:  Eye drainage, follow up diabetes.    HISTORY OF PRESENT ILLNESS:  This is a patient whom I admitted to the building in August.  She came from another nursing home.     She has bilateral below-knee amputations due to gangrenous foot ulcers, with the most recent one in June 2015 on the right.     She has type 2 diabetes, on longstanding insulin.  Interestingly, when she came here, she was only on a sliding scale.  We started her back on long-acting basal Lantus insulin, currently at 30 U.  We have had some success in bringing her fasting blood sugars down, mostly into the low to mid 100s.  Her a.c. lunch blood sugars appear to be better controlled.  However, she is still in the lower 200s later in the day.  Her most recent hemoglobin A1c was 8.5.     Another difficult issue for this patient, which I saw her for last month, is itchy and draining eyes.  I put her on Cromolyn at the beginning of October, which she says helped.  However, she once again is complaining about this, especially on the left.    Past Medical History  Diagnosis Date  . Atypical chest pain     a. non obstructive by cath in 2008 and 2010;  b. 02/2012 Myoview: non-ischemic, EF 57%  . HTN (hypertension)   . Hyperlipidemia   . Syncope and collapse     a. near-syncopal episode in November 2008;  b. 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  . CVA (cerebral infarction)     a.  Small right parietal noted incidentally 04/2007;  b. right sided embolic CVA [id number];  c. 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 Peripheral neuropathy   . Obesity, unspecified   . Esophageal reflux   .  Diabetes mellitus, type II   . Diaphragmatic hernia without mention of obstruction or gangrene   . Irritable bowel syndrome   . Morbid obesity   . Cellulitis     a. 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 Stroke     2013-'no residual'  . Vertigo   . Dry skin   . Constipation   . Tobacco abuse      CURRENT MEDICATIONS:  Medication list is reviewed.             Hydrochlorothiazide 25 q.d.      Lipitor 40 q.d.      Lisinopril 20 q.d.      ASA 81 q.d.      Colace 100 b.i.d.     Hydralazine 25 four times a day.    Depakote 250 at h.s.     NovoLog FlexPen 3 U a.c. meals for blood sugars greater than 100.    Lantus 30 U at bedtime.    REVIEW OF SYSTEMS:    GENERAL:  She has steadily gained  weight, up 17 pounds in the last three months.  She has gone from 220 to 237.  I think this is due to dietary indiscretions, however.  She does not appear to have edema.   CHEST/RESPIRATORY:  No shortness of breath.      CARDIAC:  No chest pain.   GI:  No nausea, vomiting, or diarrhea.   GU:  Urinary incontinence.   No dysuria.   MUSCULOSKELETAL:  She is working on getting a prosthesis for the right leg.  She has one for the left.  I am not sure I can see this lady as ambulating independently.  However, we will have to see how she does in therapy when she gets her new prosthesis.    PHYSICAL EXAMINATION:   VITAL SIGNS:     TEMPERATURE:  97.2.     PULSE:  60.   RESPIRATIONS:  20.     BLOOD PRESSURE:  130/70.   02 SATURATIONS:  99%.     WEIGHT:  237 pounds.    CHEST/RESPIRATORY:  Clear air entry bilaterally.    CARDIOVASCULAR:   CARDIAC:  Heart sounds are normal.   GASTROINTESTINAL:   ABDOMEN:  Distended, but no masses are noted.    CIRCULATION:   EDEMA/VARICOSITIES:  Extremities:  No edema.    ASSESSMENT/PLAN:               Allergic conjunctivitis.   The source of this is not really clear.  She has clear drainage coming out of the corners of both eyes.  This has caused some excoriation in her  lower eye.  I am going to start her back on the Cromolyn and perhaps an oral medication, as well.     Type 2 diabetes.  I think there is still room for increasing the Lantus, which I will increase to 35 U.  Last hemoglobin A1c was 8.5.    Weight gain.   I think this may be due to dietary indiscretions as I looked at what she was eating for dinner tonight.     Hyperlipidemia.  LDL cholesterol is 67.   This is satisfactory.   This was in August.

## 2015-06-11 ENCOUNTER — Other Ambulatory Visit: Payer: Self-pay | Admitting: *Deleted

## 2015-06-11 MED ORDER — OXYCODONE-ACETAMINOPHEN 5-325 MG PO TABS: ORAL_TABLET | ORAL | Status: DC

## 2015-06-11 NOTE — Telephone Encounter (Signed)
[facility] 

## 2015-06-28 ENCOUNTER — Encounter: Payer: Self-pay | Admitting: Internal Medicine

## 2015-06-28 ENCOUNTER — Non-Acute Institutional Stay (SKILLED_NURSING_FACILITY): Payer: Medicare Other | Admitting: Internal Medicine

## 2015-06-28 DIAGNOSIS — M25511 Pain in right shoulder: Secondary | ICD-10-CM | POA: Diagnosis not present

## 2015-06-28 DIAGNOSIS — Z89519 Acquired absence of unspecified leg below knee: Secondary | ICD-10-CM

## 2015-06-28 DIAGNOSIS — I10 Essential (primary) hypertension: Secondary | ICD-10-CM

## 2015-06-28 DIAGNOSIS — IMO0002 Reserved for concepts with insufficient information to code with codable children: Secondary | ICD-10-CM

## 2015-06-28 DIAGNOSIS — E1165 Type 2 diabetes mellitus with hyperglycemia: Secondary | ICD-10-CM

## 2015-06-28 DIAGNOSIS — E785 Hyperlipidemia, unspecified: Secondary | ICD-10-CM

## 2015-06-28 DIAGNOSIS — M25519 Pain in unspecified shoulder: Secondary | ICD-10-CM

## 2015-06-28 DIAGNOSIS — E1140 Type 2 diabetes mellitus with diabetic neuropathy, unspecified: Secondary | ICD-10-CM | POA: Diagnosis not present

## 2015-06-28 DIAGNOSIS — Z89529 Acquired absence of unspecified knee: Secondary | ICD-10-CM

## 2015-06-28 HISTORY — DX: Pain in unspecified shoulder: M25.519

## 2015-06-28 NOTE — Progress Notes (Signed)
Patient ID: [name], female   DOB: [birth year], 60 y.o.   MRN: [id number]    [place] Room Number: [place] of Service: SNF (31)     Allergies  Allergen Reactions  . Banana Swelling and Other (See Comments)    Facial swelling  . Penicillins Itching, Swelling and Rash    Face swells and break out  . Strawberry Extract Swelling and Other (See Comments)    Facial swelling    Chief Complaint  Patient presents with  . Medical Management of Chronic Issues    HPI:  No recent lab work for her diabetes. MAR shows most Likely glucose ranging 200-250.  Blood pressure under control  Patient has had some depressive moods in the past but this seems to be under control  Bowel movements doing okay  Complains of right shoulder discomfort. She says this is somewhat chronic but seems worse recently. She is unaware of any x-rays.  Medications: Patient's Medications  New Prescriptions   No medications on file  Previous Medications   DIVALPROEX (DEPAKOTE SPRINKLE) 125 MG CAPSULE    Take 250 mg by mouth at bedtime.   DOCUSATE SODIUM 100 MG CAPS    Take 100 mg by mouth 2 (two) times daily.   HYDRALAZINE (APRESOLINE) 25 MG TABLET    Take 25 mg by mouth 4 (four) times daily.   HYDROCHLOROTHIAZIDE (HYDRODIURIL) 25 MG TABLET    Take 25 mg by mouth every morning.   INSULIN ASPART (NOVOLOG) 100 UNIT/ML INJECTION    Inject 0-15 Units into the skin 3 (three) times daily with meals.   LISINOPRIL (PRINIVIL,ZESTRIL) 20 MG TABLET    Take 1 tablet (20 mg total) by mouth daily.   LOPERAMIDE (IMODIUM A-D) 2 MG TABLET    Take 2 mg by mouth every 4 (four) hours as needed for diarrhea or loose stools.   OXYCODONE-ACETAMINOPHEN (PERCOCET/ROXICET) 5-325 MG TABLET    Take two tablets by mouth every 4 hours as needed for pain. Do not exceed 4gm of APAP in 24 hours   POLYETHYLENE GLYCOL (MIRALAX / GLYCOLAX) PACKET    Take 17 g by mouth daily as needed for moderate  constipation.   SIMVASTATIN (ZOCOR) 80 MG TABLET    Take 1 tablet (80 mg total) by mouth every evening.  Modified Medications   No medications on file  Discontinued Medications   No medications on file     Review of Systems  Constitutional: Negative for activity change, appetite change, fatigue and unexpected weight change.  HENT: Negative for congestion and hearing loss.   Eyes: Positive for visual disturbance (corrective lenses).  Respiratory: Negative for cough and shortness of breath.   Cardiovascular: Negative for chest pain, palpitations and leg swelling.  Gastrointestinal: Negative for abdominal pain, diarrhea and constipation.  Endocrine:       Diabetic. Insulin using.  Genitourinary: Negative for dysuria and difficulty urinating.  Musculoskeletal: Positive for myalgias (to left stump). Negative for arthralgias.       History bilateral BKA Chronic right shoulder pain.  Skin: Negative for color change and wound.  Neurological: Negative for dizziness and weakness.  Psychiatric/Behavioral: Positive for dysphoric mood. Negative for behavioral problems, confusion and agitation.    Filed Vitals:   06/28/15 1544  BP: 130/80  Pulse: 80  Temp: 98 F (36.7 C)  Resp: 14   Wt Readings from Last 3 Encounters:  10/29/13 174 lb 6.1 oz (79.1 kg)  09/30/13 157  lb (71.215 kg)  09/15/13 167 lb (75.751 kg)    There is no weight on file to calculate BMI.  Physical Exam  Constitutional: She is oriented to person, place, and time. No distress.  HENT:  Head: Normocephalic and atraumatic.  Mouth/Throat: Oropharynx is clear and moist. No oropharyngeal exudate.  Eyes: Conjunctivae are normal. Pupils are equal, round, and reactive to light.  Neck: Normal range of motion. Neck supple.  Cardiovascular: Normal rate, regular rhythm and normal heart sounds.   Pulmonary/Chest: Effort normal and breath sounds normal.  Abdominal: Soft. Bowel sounds are normal.  Musculoskeletal: She exhibits  no edema or tenderness.  Bilateral BKA Painful right shoulder. Difficult to abduct or rotate.  Neurological: She is alert and oriented to person, place, and time.  Skin: Skin is warm and dry. She is not diaphoretic.  Psychiatric: She has a normal mood and affect.     Labs reviewed: Lab Summary Latest Ref Rng 12/31/2014 11/04/2013 11/03/2013 11/02/2013  Hemoglobin 12.0 - 15.0 g/dL 10.7(L) (None) 8.2(L) 8.7(L)  Hematocrit 36.0 - 46.0 % 33.9(L) (None) 25.1(L) 26.4(L)  White count 4.0 - 10.5 K/uL 11.1(H) (None) 10.1 14.2(H)  Platelet count 150 - 400 K/uL 239 (None) 317 311  Sodium 135 - 145 mmol/L 138 141 136(L) 139  Potassium 3.5 - 5.1 mmol/L 4.3 4.2 3.6(L) 3.5(L)  Calcium 8.9 - 10.3 mg/dL 8.7(L) 8.3(L) 8.0(L) 7.6(L)  Phosphorus - (None) (None) (None) (None)  Creatinine 0.44 - 1.00 mg/dL 0.88 0.62 0.58 0.62  AST 15 - 41 U/L 15 (None) (None) (None)  Alk Phos 38 - 126 U/L 146(H) (None) (None) (None)  Bilirubin 0.3 - 1.2 mg/dL 0.2(L) (None) (None) (None)  Glucose 65 - 99 mg/dL 216(H) 102(H) 115(H) 130(H)  Cholesterol - (None) (None) (None) (None)  HDL cholesterol - (None) (None) (None) (None)  Triglycerides - (None) (None) (None) (None)  LDL Direct - (None) (None) (None) (None)  LDL Calc - (None) (None) (None) (None)  Total protein 6.5 - 8.1 g/dL 6.9 (None) (None) (None)  Albumin 3.5 - 5.0 g/dL 3.0(L) (None) (None) (None)   Lab Results  Component Value Date   TSH 0.573 01/07/2012   Lab Results  Component Value Date   BUN 30* 12/31/2014   BUN 7 11/04/2013   BUN 6 11/03/2013   Lab Results  Component Value Date   CREATININE 0.88 12/31/2014   CREATININE 0.62 11/04/2013   CREATININE 0.58 11/03/2013   Lab Results  Component Value Date   HGBA1C 7.3* 10/31/2013   HGBA1C 9.2* 01/10/2013   HGBA1C 7.4* 06/24/2012       Assessment/Plan  1. Hx of BKA, unspecified laterality Well healed. No pain.  2. Right shoulder pain -Portable x-ray right shoulder  3. DM type 2,  uncontrolled, with neuropathy (HCC) HgbA1c, CMP  4. Essential hypertension CMP  5. Hyperlipidemia Lipid panel

## 2015-07-04 LAB — LIPID PANEL
CHOLESTEROL: 121 mg/dL (ref 0–200)
HDL: 31 mg/dL — AB (ref 35–70)
LDL Cholesterol: 62 mg/dL
TRIGLYCERIDES: 143 mg/dL (ref 40–160)

## 2015-07-04 LAB — HEPATIC FUNCTION PANEL
ALT: 8 U/L (ref 7–35)
AST: 7 U/L — AB (ref 13–35)
Alkaline Phosphatase: 172 U/L — AB (ref 25–125)
Bilirubin, Total: 0.3 mg/dL

## 2015-07-04 LAB — BASIC METABOLIC PANEL
BUN: 35 mg/dL — AB (ref 4–21)
CREATININE: 1 mg/dL (ref <=1.1)
Glucose: 155 mg/dL
POTASSIUM: 4.5 mmol/L (ref 3.4–5.3)
SODIUM: 140 mmol/L (ref 137–147)

## 2015-07-04 LAB — CBC AND DIFFERENTIAL
HCT: 29 % — AB (ref 36–46)
HEMOGLOBIN: 8.7 g/dL — AB (ref 12.0–16.0)
Platelets: 278 10*3/uL (ref 150–399)
WBC: 10.3 10^3/mL

## 2015-07-04 LAB — HEMOGLOBIN A1C: HEMOGLOBIN A1C: 7.8

## 2015-07-09 ENCOUNTER — Encounter: Payer: Self-pay | Admitting: *Deleted

## 2015-08-15 ENCOUNTER — Non-Acute Institutional Stay (SKILLED_NURSING_FACILITY): Payer: Medicare Other | Admitting: Nurse Practitioner

## 2015-08-15 DIAGNOSIS — I2510 Atherosclerotic heart disease of native coronary artery without angina pectoris: Secondary | ICD-10-CM | POA: Diagnosis not present

## 2015-08-15 DIAGNOSIS — F329 Major depressive disorder, single episode, unspecified: Secondary | ICD-10-CM | POA: Diagnosis not present

## 2015-08-15 DIAGNOSIS — D649 Anemia, unspecified: Secondary | ICD-10-CM | POA: Diagnosis not present

## 2015-08-15 DIAGNOSIS — E1165 Type 2 diabetes mellitus with hyperglycemia: Secondary | ICD-10-CM

## 2015-08-15 DIAGNOSIS — F32A Depression, unspecified: Secondary | ICD-10-CM | POA: Insufficient documentation

## 2015-08-15 DIAGNOSIS — IMO0002 Reserved for concepts with insufficient information to code with codable children: Secondary | ICD-10-CM

## 2015-08-15 DIAGNOSIS — E1140 Type 2 diabetes mellitus with diabetic neuropathy, unspecified: Secondary | ICD-10-CM

## 2015-08-15 DIAGNOSIS — I10 Essential (primary) hypertension: Secondary | ICD-10-CM

## 2015-08-15 DIAGNOSIS — K5900 Constipation, unspecified: Secondary | ICD-10-CM | POA: Insufficient documentation

## 2015-08-15 NOTE — Progress Notes (Signed)
Patient ID: [name], female   DOB: 1955-11-02, 60 y.o.   MRN: [id number]  Location:  [place] Room Number: [place] of Service:  SNF (31) Provider: Marlana Latus NP  [name], MD  Patient Care Team: Elwyn Reach, MD as PCP - General (Internal Medicine) Renella Cunas, MD (Cardiology)  Extended Emergency Contact Information Primary Emergency Contact: Charlena Cross, [place] [address] [place] Phone: [phone] Work Phone: [phone] Relation: Niece Secondary Emergency Contact: [place] Phone: [phone] Relation: Sister  Code Status:  DNR Goals of care: Advanced Directive information Advanced Directives 08/15/2015  Does patient have an advance directive? No  Would patient like information on creating an advanced directive? No - patient declined information  Pre-existing out of facility DNR order (yellow form or pink MOST form) -     Chief Complaint  Patient presents with  . Medical Management of Chronic Issues    Routine Visit    HPI:  Pt is a 60 y.o. female seen today for medical management of chronic diseases. Hx of T2DM, on insulin last Hgb a1c 7.8 07/04/15, noted Hgb dropped from 10.7 12/31/14 to 8.7 07/04/15, no noted active bleed, taking Depakote for depressive mood, neuropathic pain is managed with prn Tylenol and Percocet, blood pressure is controlled on Hydralazine, Zestril, and HCT. Constipation is stable on MiraLax prn and Colace.   Past Medical History  Diagnosis Date  . Atypical chest pain     a. non obstructive by cath in 2008 and 2010;  b. 02/2012 Myoview: non-ischemic, EF 57%  . HTN (hypertension)   . Hyperlipidemia   . Syncope and collapse     a. near-syncopal episode in November 2008;  b. 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  . CVA (cerebral infarction)     a.  Small right parietal noted incidentally 04/2007;  b. right sided embolic  CVA 123456;  c. 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 Peripheral neuropathy ([place])   . Obesity, unspecified   . Esophageal reflux   . Diabetes mellitus, type II ([place])   . Diaphragmatic hernia without mention of obstruction or gangrene   . Irritable bowel syndrome   . Morbid obesity ([place])   . Cellulitis     a. 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 Stroke [facility])     2013-'no residual'  . Vertigo   . Dry skin   . Constipation   . Tobacco abuse   . Hx of BKA     Bilateral  . Pain in shoulder 06/28/2015   Past Surgical History  Procedure Laterality Date  . Invasive electrophysiologic study  5/09    followed by insertion of an implantable loop recorder. S/p removal   . Colonoscopy    . Cholecystectomy    . Multiple toe surgeries    . Cardiac catheterization      LAD 30%, circumflex 50%, OM 75%, RI 60% with small branch 80%, dominant RCA 60%, EF 45-50%  . I&d extremity  12/23/2011    Procedure: IRRIGATION AND DEBRIDEMENT EXTREMITY;  Surgeon: Augustin Schooling, MD;  Location: [place];  Service: Orthopedics;  Laterality: Left;  Left Foot  . I&d extremity  12/26/2011    Procedure: IRRIGATION AND DEBRIDEMENT EXTREMITY;  Surgeon: Wylene Simmer, MD;  Location: [place];  Service: Orthopedics;  Laterality: Left;  LEFT FOOT I&D WITH POSSIBLE WOUND VAC APPLICATION, POSSIBLE LEFT FIRST RAY AMPUTATION  . Amputation  12/30/2011    Procedure: AMPUTATION RAY;  Surgeon: Wylene Simmer, MD;  Location: [place];  Service: Orthopedics;  Laterality: Left;  LEFT FIRST RAY AMPUTATION  . Eye surgery      cataract  . Amputation  01/13/2012    Procedure: AMPUTATION BELOW KNEE;  Surgeon: Wylene Simmer, MD;  Location: [place];  Service: Orthopedics;  Laterality: Left;  . Amputation  03/04/2012    Procedure: AMPUTATION BELOW KNEE;  Surgeon: Wylene Simmer, MD;  Location: [place];  Service: Orthopedics;  Laterality: Left;  Revision of Left Below Knee  Amputation  . Tee without cardioversion N/A 07/07/2012    Procedure: TRANSESOPHAGEAL ECHOCARDIOGRAM (TEE);  Surgeon: Lelon Perla, MD;  Location: [facility] ENDOSCOPY;  Service: Cardiovascular;  Laterality: N/A;  . I&d extremity Right 10/29/2013    Procedure: [id number] AND DEBRIDEMENT EXTREMITY;  Surgeon: Johnn Hai, MD;  Location: [place];  Service: Orthopedics;  Laterality: Right;  . Amputation Right 11/02/2013    Procedure: AMPUTATION BELOW KNEE;  Surgeon: Newt Minion, MD;  Location: [place];  Service: Orthopedics;  Laterality: Right;  Right Below Knee Amputation    Allergies  Allergen Reactions  . Banana Swelling and Other (See Comments)    Facial swelling  . Penicillins Itching, Swelling and Rash    Face swells and break out  . Strawberry Extract Swelling and Other (See Comments)    Facial swelling      Medication List       This list is accurate as of: 08/15/15 11:59 PM.  Always use your most recent med list.               acetaminophen 325 MG tablet  Commonly known as:  TYLENOL  Take 650 mg by mouth every 4 (four) hours as needed for mild pain, moderate pain, fever or headache.     aspirin 81 MG tablet  Take 81 mg by mouth daily.     atorvastatin 40 MG tablet  Commonly known as:  LIPITOR  Take 40 mg by mouth daily. For Hyperlipidemia     cetirizine 10 MG tablet  Commonly known as:  ZYRTEC  Take 10 mg by mouth daily.     divalproex 125 MG capsule  Commonly known as:  DEPAKOTE SPRINKLE  Take 250 mg by mouth at bedtime.     DSS 100 MG Caps  Take 100 mg by mouth 2 (two) times daily.     hydrALAZINE 25 MG tablet  Commonly known as:  APRESOLINE  Take 25 mg by mouth 4 (four) times daily.     hydrochlorothiazide 25 MG tablet  Commonly known as:  HYDRODIURIL  Take 25 mg by mouth every morning.     insulin aspart 100 UNIT/ML injection  Commonly known as:  novoLOG  Inject 3 Units into the skin 3 (three) times daily before meals. Hold If BS >100     insulin  glargine 100 UNIT/ML injection  Commonly known as:  LANTUS  Inject 35 Units into the skin at bedtime.     lisinopril 20 MG tablet  Commonly known as:  PRINIVIL,ZESTRIL  Take 1 tablet (20 mg total) by mouth daily.     loperamide 2 MG tablet  Commonly known as:  IMODIUM A-D  Take 2 mg by mouth every 4 (four) hours as needed for diarrhea or loose  stools.     oxyCODONE-acetaminophen 5-325 MG tablet  Commonly known as:  PERCOCET/ROXICET  Take two tablets by mouth every 4 hours as needed for pain. Do not exceed 4gm of APAP in 24 hours     polyethylene glycol packet  Commonly known as:  MIRALAX / GLYCOLAX  Take 17 g by mouth daily as needed for moderate constipation.        Review of Systems  Immunization History  Administered Date(s) Administered  . Influenza-Unspecified 02/28/2015  . Pneumococcal Polysaccharide-23 01/17/2012   Pertinent  Health Maintenance Due  Topic Date Due  . OPHTHALMOLOGY EXAM  10/25/1964  . PAP SMEAR  10/26/1975  . COLONOSCOPY  10/25/2004  . MAMMOGRAM  03/18/2012  . INFLUENZA VACCINE  12/18/2015  . HEMOGLOBIN A1C  01/01/2016  . FOOT EXAM  06/27/2016   Fall Risk  06/28/2015  Falls in the past year? No   Functional Status Survey:    Filed Vitals:   08/15/15 0924  BP: 141/84  Pulse: 71  Temp: 98.2 F (36.8 C)  TempSrc: Oral  Resp: 18  Weight: 253 lb (114.76 kg)   Body mass index is 40.85 kg/(m^2). Physical Exam  Constitutional: She appears well-developed and well-nourished. No distress.  Over weight  HENT:  Head: Normocephalic and atraumatic.  Right Ear: External ear normal.  Left Ear: External ear normal.  Musculoskeletal: She exhibits edema and tenderness.  Right shoulder pain with limited ROM, feeds self with the left hand. R+L AKA.  Skin: She is not diaphoretic.    Labs reviewed:  Recent Labs  12/29/14 12/31/14 1845 07/04/15  NA 141 138 140  K  --  4.3 4.5  CL  --  104  --   CO2  --  25  --   GLUCOSE  --  216*  --   BUN  26* 30* 35*  CREATININE 0.9 0.88 1.0  CALCIUM  --  8.7*  --     Recent Labs  12/31/14 1845 07/04/15  AST 15 7*  ALT 19 8  ALKPHOS 146* 172*  BILITOT 0.2*  --   PROT 6.9  --   ALBUMIN 3.0*  --     Recent Labs  12/29/14 12/31/14 1845 07/04/15  WBC 13.0 11.1* 10.3  NEUTROABS  --  7.6  --   HGB  --  10.7* 8.7*  HCT  --  33.9* 29*  MCV  --  83.7  --   PLT  --  239 278   Lab Results  Component Value Date   TSH 0.573 01/07/2012   Lab Results  Component Value Date   HGBA1C 7.8 07/04/2015   Lab Results  Component Value Date   CHOL 121 07/04/2015   HDL 31* 07/04/2015   LDLCALC 62 07/04/2015   TRIG 143 07/04/2015   CHOLHDL 4.8 01/10/2013    Significant Diagnostic Results in last 30 days:  No results found.  Assessment/Plan  CAD, NATIVE VESSEL No angina since last seen, continue ASA 81mg  and Statin for cardiovascular risk reduction.   DM type 2, uncontrolled, with neuropathy ([place]) 07/04/15 Hgb a1c 7.8, continue Lantus 35u daily, Novolog 3 u with meals for CBG>100, pain is managed with prn Tylenol and Percocet q4h  HTN (hypertension) Blood pressure is controlled, continue Hydralazine 25mg  qid, Zestril 20mg  qd, HCTZ 25mg  qd, last Na 140, K 4.5, Bun 35, creat 1.0.   Constipation Stable, continue MiraLax qd prn, Colace bid.   Normocytic anemia Hgb dropped from 10.7 12/31/14 to 8.7 07/04/15,  will obtain CBC, anemia panel, and Guaiac stools x3.   Depression Her mood is partial controlled with Depakote 250mg  qhs    Family/ staff Communication: continue SNF for care needs  Labs/tests ordered:  Anemia panel, CBC

## 2015-08-15 NOTE — Assessment & Plan Note (Signed)
Blood pressure is controlled, continue Hydralazine 25mg  qid, Zestril 20mg  qd, HCTZ 25mg  qd, last Na 140, K 4.5, Bun 35, creat 1.0.

## 2015-08-15 NOTE — Assessment & Plan Note (Signed)
Stable, continue MiraLax qd prn, Colace bid.

## 2015-08-15 NOTE — Assessment & Plan Note (Signed)
Hgb dropped from 10.7 12/31/14 to 8.7 07/04/15, will obtain CBC, anemia panel, and Guaiac stools x3.

## 2015-08-15 NOTE — Assessment & Plan Note (Signed)
07/04/15 Hgb a1c 7.8, continue Lantus 35u daily, Novolog 3 u with meals for CBG>100, pain is managed with prn Tylenol and Percocet q4h

## 2015-08-15 NOTE — Assessment & Plan Note (Signed)
Her mood is partial controlled with Depakote 250mg  qhs

## 2015-08-15 NOTE — Assessment & Plan Note (Signed)
No angina since last seen, continue ASA 81mg  and Statin for cardiovascular risk reduction.

## 2015-08-16 ENCOUNTER — Encounter: Payer: Self-pay | Admitting: Nurse Practitioner

## 2015-08-17 LAB — CBC AND DIFFERENTIAL
HCT: 30 % — AB (ref 36–46)
Hemoglobin: 9 g/dL — AB (ref 12.0–16.0)
PLATELETS: 294 10*3/uL (ref 150–399)
WBC: 10.4 10*3/mL

## 2015-08-20 ENCOUNTER — Non-Acute Institutional Stay (SKILLED_NURSING_FACILITY): Payer: Medicare Other | Admitting: Internal Medicine

## 2015-08-20 ENCOUNTER — Encounter: Payer: Self-pay | Admitting: Internal Medicine

## 2015-08-20 DIAGNOSIS — Z89511 Acquired absence of right leg below knee: Secondary | ICD-10-CM | POA: Insufficient documentation

## 2015-08-20 DIAGNOSIS — I2510 Atherosclerotic heart disease of native coronary artery without angina pectoris: Secondary | ICD-10-CM | POA: Diagnosis not present

## 2015-08-20 DIAGNOSIS — Z89512 Acquired absence of left leg below knee: Secondary | ICD-10-CM | POA: Insufficient documentation

## 2015-08-20 DIAGNOSIS — E1165 Type 2 diabetes mellitus with hyperglycemia: Secondary | ICD-10-CM

## 2015-08-20 DIAGNOSIS — IMO0002 Reserved for concepts with insufficient information to code with codable children: Secondary | ICD-10-CM

## 2015-08-20 DIAGNOSIS — K029 Dental caries, unspecified: Secondary | ICD-10-CM | POA: Diagnosis not present

## 2015-08-20 DIAGNOSIS — D649 Anemia, unspecified: Secondary | ICD-10-CM | POA: Diagnosis not present

## 2015-08-20 DIAGNOSIS — I10 Essential (primary) hypertension: Secondary | ICD-10-CM | POA: Diagnosis not present

## 2015-08-20 DIAGNOSIS — E1140 Type 2 diabetes mellitus with diabetic neuropathy, unspecified: Secondary | ICD-10-CM

## 2015-08-20 LAB — CBC AND DIFFERENTIAL
HCT: 30 % — AB (ref 36–46)
Hemoglobin: 8.7 g/dL — AB (ref 12.0–16.0)
PLATELETS: 290 10*3/uL (ref 150–399)
WBC: 10.3 10*3/mL

## 2015-08-20 LAB — BASIC METABOLIC PANEL
BUN: 28 mg/dL — AB (ref 4–21)
CREATININE: 1.2 mg/dL — AB (ref 0.5–1.1)
Glucose: 117 mg/dL
Potassium: 4.3 mmol/L (ref 3.4–5.3)
Sodium: 143 mmol/L (ref 137–147)

## 2015-08-20 NOTE — Progress Notes (Signed)
Patient ID: [name], female   DOB: Oct 25, 1955, 60 y.o.   MRN: [id number]  Provider:  Jeanmarie Hubert, MD Location: [place] Room Number: [id number] Place of Service:  SNF (31)   PCP: Estill Dooms, MD Patient Care Team: Estill Dooms, MD as PCP - General (Internal Medicine) Renella Cunas, MD (Cardiology)  Extended Emergency Contact Information Primary Emergency Contact: Charlena Cross, [place] [address] [place] Phone: [phone] Work Phone: [phone] Relation: Niece Secondary Emergency Contact: [place] Phone: [phone] Relation: Sister  Code Status: full Goals of Care: Advanced Directive information Advanced Directives 08/20/2015  Does patient have an advance directive? No  Would patient like information on creating an advanced directive? -  Pre-existing out of facility DNR order (yellow form or pink MOST form) -     Chief Complaint  Patient presents with  . Annual Exam    History and Physical   . Medical Management of Chronic Issues    diabets, hypertension, ASHD, Obesity    HPI: Patient is a 60 y.o. female seen today for an annual comprehensive examination. She is going to have some teeth removed and the dental program asked that her medical conditions and history be reviewed.  Patient says she is feeling pretty good today. She has no specific complaints.  She has diabetic with diabetic neuropathy. Lab work done 06/25/15 reflects poor control with hemoglobin A1c of 7.9%.  She is chronically anemic lab work done 07/03/2013 shows normal WBC of 10,300, but hemoglobin is low at 8.7. Has a normocytic anemia with an MCV of 87.5. Is quite chronic and appears to be stable. Follow-up CBC done 08/17/2015 shows hemoglobin 9.0, MCV 86.1 serum ferritin is normal at 79.7. Folic acid is normal at 9.52 ng/mL. B12 is normal at 439 pg/mL. Reticulocyte count was 1.7%. Serum iron 26,  TIBC 220, iron saturation 12%.  Chemistries done 07/04/2015 were normal except for glucose 155 mg percent and a modest elevation in the alkaline phosphatase at 172. BUN was 35 and creatinine 0.97.  Patient has a previous history of hypertension that is well controlled.  She has known coronary artery disease. She has no chest pains recently and denies palpitations. She denies dyspnea.  Patient had a CVA in 2014 with continuing issues of right hemiparesis and right facial droop.  GERD and Hiatal hernia are currently asymptomatic.  Patient previously had ulcerations and gangrene of both feet. 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  Past Medical History  Diagnosis Date  . Atypical chest pain     a. non obstructive by cath in 2008 and 2010;  b. 02/2012 Myoview: non-ischemic, EF 57%  . HTN (hypertension)   . Hyperlipidemia   . Syncope and collapse     a. near-syncopal episode in November 2008;  b. 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  . CVA (cerebral infarction)     a.  Small right parietal noted incidentally 04/2007;  b. right sided embolic CVA 123456;  c. 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 Peripheral neuropathy ([place])   . Obesity, unspecified   . Esophageal reflux   . Diabetes mellitus, type II ([place])   . Diaphragmatic hernia without mention of obstruction or gangrene   . Irritable bowel syndrome   . Morbid obesity (  [place])   . Cellulitis     a. 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 Stroke [facility])     2013-'no residual'  . Vertigo   . Dry skin   . Constipation   . Tobacco abuse   . Hx of BKA     Bilateral  . Pain in shoulder 06/28/2015   Past Surgical History  Procedure Laterality Date  . Invasive electrophysiologic study  5/09    followed by insertion of an implantable loop recorder. S/p removal   . Colonoscopy    . Cholecystectomy    . Multiple toe surgeries    . Cardiac  catheterization      LAD 30%, circumflex 50%, OM 75%, RI 60% with small branch 80%, dominant RCA 60%, EF 45-50%  . I&d extremity  12/23/2011    Procedure: IRRIGATION AND DEBRIDEMENT EXTREMITY;  Surgeon: Augustin Schooling, MD;  Location: [place];  Service: Orthopedics;  Laterality: Left;  Left Foot  . I&d extremity  12/26/2011    Procedure: IRRIGATION AND DEBRIDEMENT EXTREMITY;  Surgeon: Wylene Simmer, MD;  Location: [place];  Service: Orthopedics;  Laterality: Left;  LEFT FOOT I&D WITH POSSIBLE WOUND VAC APPLICATION, POSSIBLE LEFT FIRST RAY AMPUTATION  . Amputation  12/30/2011    Procedure: AMPUTATION RAY;  Surgeon: Wylene Simmer, MD;  Location: [place];  Service: Orthopedics;  Laterality: Left;  LEFT FIRST RAY AMPUTATION  . Eye surgery      cataract  . Amputation  01/13/2012    Procedure: AMPUTATION BELOW KNEE;  Surgeon: Wylene Simmer, MD;  Location: [place];  Service: Orthopedics;  Laterality: Left;  . Amputation  03/04/2012    Procedure: AMPUTATION BELOW KNEE;  Surgeon: Wylene Simmer, MD;  Location: [place];  Service: Orthopedics;  Laterality: Left;  Revision of Left Below Knee Amputation  . Tee without cardioversion N/A 07/07/2012    Procedure: TRANSESOPHAGEAL ECHOCARDIOGRAM (TEE);  Surgeon: Lelon Perla, MD;  [facility] ENDOSCOPY;  Service: Cardiovascular;  Laterality: N/A;  . I&d extremity Right 10/29/2013    Procedure: [id number] AND DEBRIDEMENT EXTREMITY;  Surgeon: Johnn Hai, MD;  Location: [place];  Service: Orthopedics;  Laterality: Right;  . Amputation Right 11/02/2013    Procedure: AMPUTATION BELOW KNEE;  Surgeon: Newt Minion, MD;  Location: [place];  Service: Orthopedics;  Laterality: Right;  Right Below Knee Amputation    reports that she has been smoking Cigarettes.  She has a 10 pack-year smoking history. She has never used smokeless tobacco. She reports that she drinks about 2.4 oz of alcohol per week. She reports that she does not use illicit drugs. Social History   Social History  . Marital  Status: Single    Spouse Name: N/A  . Number of Children: N/A  . Years of Education: N/A   Occupational History  . Document [employer]   Social History Main Topics  . Smoking status: Current Every Day Smoker -- 0.25 packs/day for 40 years    Types: Cigarettes  . Smokeless tobacco: Never Used  . 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  . Drug Use: No  . Sexual Activity: Not Currently    Birth Control/ Protection: Post-menopausal   Other Topics Concern  . Not on file   Social History Narrative   Lives at [facility] since 12/19/14     Does  not routinely exercise.     works as Barista.    >25 pack year history.    FULL CODE   Family History  Problem Relation Age of Onset  . Pneumonia Mother   . Gallbladder disease Mother     cancer  . Heart failure Mother   . Diabetes Father   . Coronary artery disease Father   . Stroke Neg Hx     Pertinent  Health Maintenance Due  Topic Date Due  . OPHTHALMOLOGY EXAM  10/25/1964  . PAP SMEAR  10/26/1975  . COLONOSCOPY  10/25/2004  . MAMMOGRAM  03/18/2012  . INFLUENZA VACCINE  12/18/2015  . HEMOGLOBIN A1C  01/01/2016  . FOOT EXAM  06/27/2016   Fall Risk  06/28/2015  Falls in the past year? No   Depression screen PHQ 2/9 06/28/2015  Decreased Interest 0  Down, Depressed, Hopeless 0  PHQ - 2 Score 0     Allergies  Allergen Reactions  . Banana Swelling and Other (See Comments)    Facial swelling  . Penicillins Itching, Swelling and Rash    Face swells and break out  . Strawberry Extract Swelling and Other (See Comments)    Facial swelling      Medication List       This list is accurate as of: 08/20/15 11:19 AM.  Always use your most recent med list.               acetaminophen 325 MG tablet  Commonly known as:  TYLENOL  Take 650 mg by mouth every 4 (four) hours as needed for mild pain, moderate  pain, fever or headache.     aspirin 81 MG tablet  Take 81 mg by mouth daily.     atorvastatin 40 MG tablet  Commonly known as:  LIPITOR  Take 40 mg by mouth daily. For Hyperlipidemia     cetirizine 10 MG tablet  Commonly known as:  ZYRTEC  Take 10 mg by mouth daily.     cromolyn 4 % ophthalmic solution  Commonly known as:  OPTICROM  Place 1 drop into both eyes 4 (four) times daily.     divalproex 125 MG capsule  Commonly known as:  DEPAKOTE SPRINKLE  Take 250 mg by mouth at bedtime.     DSS 100 MG Caps  Take 100 mg by mouth 2 (two) times daily.     hydrALAZINE 25 MG tablet  Commonly known as:  APRESOLINE  Take 25 mg by mouth 4 (four) times daily.     hydrochlorothiazide 25 MG tablet  Commonly known as:  HYDRODIURIL  Take 25 mg by mouth every morning.     insulin aspart 100 UNIT/ML injection  Commonly known as:  novoLOG  Inject 3 Units into the skin 3 (three) times daily before meals. Hold If BS >100     insulin glargine 100 UNIT/ML injection  Commonly known as:  LANTUS  Inject 35 Units into the skin at bedtime.     lisinopril 20 MG tablet  Commonly known as:  PRINIVIL,ZESTRIL  Take 1 tablet (20 mg total) by mouth daily.     loperamide 2 MG tablet  Commonly known as:  IMODIUM A-D  Take 2 mg by mouth every 4 (four) hours as needed for diarrhea or loose stools.     oxyCODONE-acetaminophen 5-325 MG tablet  Commonly known as:  PERCOCET/ROXICET  Take two tablets by mouth every 4 hours as needed for pain. Do not exceed 4gm of  APAP in 24 hours     polyethylene glycol packet  Commonly known as:  MIRALAX / GLYCOLAX  Take 17 g by mouth daily as needed for moderate constipation.        Review of Systems  Constitutional: Negative for activity change, appetite change, fatigue and unexpected weight change.  HENT: Negative for congestion and hearing loss.        Dental caries right mandible  Eyes: Positive for visual disturbance (corrective lenses).  Respiratory:  Negative for cough and shortness of breath.   Cardiovascular: Negative for chest pain, palpitations and leg swelling.  Gastrointestinal: Negative for abdominal pain, diarrhea and constipation.  Endocrine:       Diabetic. Insulin using.  Genitourinary: Negative for dysuria and difficulty urinating.  Musculoskeletal: Positive for myalgias (to left stump). Negative for arthralgias.       History bilateral BKA Chronic right shoulder pain.  Skin: Negative for color change and wound.  Neurological: Negative for dizziness and weakness.  Psychiatric/Behavioral: Positive for dysphoric mood. Negative for behavioral problems, confusion and agitation.    Filed Vitals:   08/20/15 1107  BP: 141/84  Pulse: 71  Temp: 98.2 F (36.8 C)  Resp: 18  Height: 5\' 6"  (1.676 m)  Weight: 253 lb (114.76 kg)  SpO2: 95%   Body mass index is 40.85 kg/(m^2). Physical Exam  Constitutional: She is oriented to person, place, and time. She appears well-developed and well-nourished. No distress.  Obese  HENT:  Head: Normocephalic and atraumatic.  Right Ear: External ear normal.  Left Ear: External ear normal.  Nose: Nose normal.  Mouth/Throat: Oropharynx is clear and moist. No oropharyngeal exudate.  Caries teeth right mandible  Eyes: Conjunctivae and EOM are normal. Pupils are equal, round, and reactive to light. No scleral icterus.  Neck: Normal range of motion. Neck supple. No JVD present. No tracheal deviation present. No thyromegaly present.  Cardiovascular: Normal rate, regular rhythm, normal heart sounds and intact distal pulses.  Exam reveals no gallop and no friction rub.   No murmur heard. Pulmonary/Chest: Effort normal and breath sounds normal. No respiratory distress. She has no wheezes. She has no rales. She exhibits no tenderness.  Abdominal: Soft. Bowel sounds are normal. She exhibits no distension and no mass. There is no tenderness.  Musculoskeletal: Normal range of motion. She exhibits no  edema or tenderness.  Bilateral BKA Painful right shoulder. Difficult to abduct or rotate.  Lymphadenopathy:    She has no cervical adenopathy.  Neurological: She is alert and oriented to person, place, and time. No cranial nerve deficit. Coordination normal.  Right hemiparesis and right facial droop..  Skin: Skin is warm and dry. No rash noted. She is not diaphoretic. No erythema. No pallor.  Psychiatric: She has a normal mood and affect. Her behavior is normal. Judgment and thought content normal.    Labs reviewed: Basic Metabolic Panel:  Recent Labs  12/29/14 12/31/14 1845 07/04/15  NA 141 138 140  K  --  4.3 4.5  CL  --  104  --   CO2  --  25  --   GLUCOSE  --  216*  --   BUN 26* 30* 35*  CREATININE 0.9 0.88 1.0  CALCIUM  --  8.7*  --    Liver Function Tests:  Recent Labs  12/31/14 1845 07/04/15  AST 15 7*  ALT 19 8  ALKPHOS 146* 172*  BILITOT 0.2*  --   PROT 6.9  --   ALBUMIN 3.0*  --  No results for input(s): LIPASE, AMYLASE in the last 8760 hours. No results for input(s): AMMONIA in the last 8760 hours. CBC:  Recent Labs  12/29/14 12/31/14 1845 07/04/15  WBC 13.0 11.1* 10.3  NEUTROABS  --  7.6  --   HGB  --  10.7* 8.7*  HCT  --  33.9* 29*  MCV  --  83.7  --   PLT  --  239 278   Cardiac Enzymes: No results for input(s): CKTOTAL, CKMB, CKMBINDEX, TROPONINI in the last 8760 hours. BNP: Invalid input(s): POCBNP Lab Results  Component Value Date   HGBA1C 7.8 07/04/2015   Lab Results  Component Value Date   TSH 0.573 01/07/2012   Lab Results  Component Value Date   VITAMINB12 608 07/02/2011   Lab Results  Component Value Date   FOLATE 16.4 07/02/2011   Lab Results  Component Value Date   IRON 61 07/02/2011   TIBC 242* 07/02/2011   FERRITIN 204 07/02/2011     Assessment/Plan 1. Dental caries Patient is approved for planned dental procedures  2. DM type 2, uncontrolled, with neuropathy (HCC) Increase insulin glargine to 40 units at  bedtime  3. Essential hypertension Continue current medication  4. Normocytic anemia Started ferrous sulfate 325 mg daily Follow-up CBC and reticulocyte count in 3 weeks  5. Atherosclerosis of native coronary artery of native heart without angina pectoris Stable without angina or palpitations

## 2015-09-10 LAB — CBC AND DIFFERENTIAL
HCT: 32 % — AB (ref 36–46)
Hemoglobin: 9.6 g/dL — AB (ref 12.0–16.0)
Platelets: 300 10*3/uL (ref 150–399)
WBC: 11.2 10*3/mL

## 2015-09-13 ENCOUNTER — Other Ambulatory Visit: Payer: Self-pay

## 2015-09-13 MED ORDER — OXYCODONE-ACETAMINOPHEN 5-325 MG PO TABS: ORAL_TABLET | ORAL | Status: DC

## 2015-09-26 ENCOUNTER — Encounter: Payer: Self-pay | Admitting: Nurse Practitioner

## 2015-09-26 ENCOUNTER — Non-Acute Institutional Stay (SKILLED_NURSING_FACILITY): Payer: Medicare Other | Admitting: Nurse Practitioner

## 2015-09-26 DIAGNOSIS — I10 Essential (primary) hypertension: Secondary | ICD-10-CM | POA: Diagnosis not present

## 2015-09-26 DIAGNOSIS — K5900 Constipation, unspecified: Secondary | ICD-10-CM | POA: Diagnosis not present

## 2015-09-26 DIAGNOSIS — F329 Major depressive disorder, single episode, unspecified: Secondary | ICD-10-CM | POA: Diagnosis not present

## 2015-09-26 DIAGNOSIS — E1165 Type 2 diabetes mellitus with hyperglycemia: Secondary | ICD-10-CM | POA: Diagnosis not present

## 2015-09-26 DIAGNOSIS — D649 Anemia, unspecified: Secondary | ICD-10-CM

## 2015-09-26 DIAGNOSIS — F32A Depression, unspecified: Secondary | ICD-10-CM

## 2015-09-26 DIAGNOSIS — IMO0002 Reserved for concepts with insufficient information to code with codable children: Secondary | ICD-10-CM

## 2015-09-26 DIAGNOSIS — E1140 Type 2 diabetes mellitus with diabetic neuropathy, unspecified: Secondary | ICD-10-CM | POA: Diagnosis not present

## 2015-09-26 NOTE — Assessment & Plan Note (Signed)
07/04/15 Hgb a1c 7.8, continue Lantus 35u daily, Novolog 3 u with meals for CBG>100, pain is managed with prn Tylenol and Percocet q4h

## 2015-09-26 NOTE — Progress Notes (Signed)
Patient ID: [name], female   DOB: 05-Jan-1956, 60 y.o.   MRN: [id number]  Location:  [place] Room Number: [place] of Service:  SNF (31) Provider: Lennie Odor Marguita Venning NP  GREEN, Viviann Spare, MD  Patient Care Team: Estill Dooms, MD as PCP - General (Internal Medicine) Renella Cunas, MD (Cardiology)  Extended Emergency Contact Information Primary Emergency Contact: Charlena Cross, [place] [address] [place] Phone: [phone] Work Phone: [phone] Relation: Niece Secondary Emergency Contact: [place] Phone: [phone] Relation: Sister  Code Status:  DNR Goals of care: Advanced Directive information Advanced Directives 09/26/2015  Does patient have an advance directive? No  Would patient like information on creating an advanced directive? No - patient declined information  Pre-existing out of facility DNR order (yellow form or pink MOST form) -     Chief Complaint  Patient presents with  . Medical Management of Chronic Issues    Routine Visit with labs    HPI:  Pt is a 60 y.o. female seen today for medical management of chronic diseases. Hx of T2DM, on insulin last Hgb a1c 7.8 07/04/15, noted Hgb dropped from 10.7 12/31/14 to 8.7 07/04/15, then improved 09/10/15 9.6.  no noted active bleed, taking Depakote for depressive mood, 08/20/15 valproic acid level 33.1, neuropathic pain is managed with prn Tylenol and Percocet, blood pressure is controlled on Hydralazine, Zestril, and HCT. Constipation is stable on MiraLax prn and Colace.   Past Medical History  Diagnosis Date  . Atypical chest pain     a. non obstructive by cath in 2008 and 2010;  b. 02/2012 Myoview: non-ischemic, EF 57%  . HTN (hypertension)   . Hyperlipidemia   . Syncope and collapse     a. near-syncopal episode in November 2008;  b. 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  . CVA (cerebral infarction)     a.   Small right parietal noted incidentally 04/2007;  b. right sided embolic CVA 123456;  c. 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 Peripheral neuropathy ([place])   . Obesity, unspecified   . Esophageal reflux   . Diabetes mellitus, type II ([place])   . Diaphragmatic hernia without mention of obstruction or gangrene   . Irritable bowel syndrome   . Morbid obesity ([place])   . Cellulitis     a. 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 Stroke [facility])     2013-'no residual'  . Vertigo   . Dry skin   . Constipation   . Tobacco abuse   . Hx of BKA     Bilateral  . Pain in shoulder 06/28/2015   Past Surgical History  Procedure Laterality Date  . Invasive electrophysiologic study  5/09    followed by insertion of an implantable loop recorder. S/p removal   . Colonoscopy    . Cholecystectomy    . Multiple toe surgeries    . Cardiac catheterization      LAD 30%, circumflex 50%, OM 75%, RI 60% with small branch 80%, dominant RCA 60%, EF 45-50%  . I&d extremity  12/23/2011    Procedure: IRRIGATION AND DEBRIDEMENT EXTREMITY;  Surgeon: Augustin Schooling, MD;  Location: [place];  Service: Orthopedics;  Laterality: Left;  Left Foot  . I&d extremity  12/26/2011    Procedure: IRRIGATION AND DEBRIDEMENT EXTREMITY;  Surgeon: Wylene Simmer, MD;  Location: [place];  Service: Orthopedics;  Laterality: Left;  LEFT FOOT I&D WITH POSSIBLE WOUND VAC APPLICATION, POSSIBLE LEFT FIRST RAY AMPUTATION  . Amputation  12/30/2011    Procedure: AMPUTATION RAY;  Surgeon: Wylene Simmer, MD;  Location: [place];  Service: Orthopedics;  Laterality: Left;  LEFT FIRST RAY AMPUTATION  . Eye surgery      cataract  . Amputation  01/13/2012    Procedure: AMPUTATION BELOW KNEE;  Surgeon: Wylene Simmer, MD;  Location: [place];  Service: Orthopedics;  Laterality: Left;  . Amputation  03/04/2012    Procedure: AMPUTATION BELOW KNEE;  Surgeon: Wylene Simmer, MD;  Location: [place];  Service: Orthopedics;  Laterality: Left;  Revision of Left Below Knee Amputation  . Tee without cardioversion N/A 07/07/2012    Procedure: TRANSESOPHAGEAL ECHOCARDIOGRAM (TEE);  Surgeon: Lelon Perla, MD;  Location: [facility] ENDOSCOPY;  Service: Cardiovascular;  Laterality: N/A;  . I&d extremity Right 10/29/2013    Procedure: [id number] AND DEBRIDEMENT EXTREMITY;  Surgeon: Johnn Hai, MD;  Location: [place];  Service: Orthopedics;  Laterality: Right;  . Amputation Right 11/02/2013    Procedure: AMPUTATION BELOW KNEE;  Surgeon: Newt Minion, MD;  Location: [place];  Service: Orthopedics;  Laterality: Right;  Right Below Knee Amputation    Allergies  Allergen Reactions  . Banana Swelling and Other (See Comments)    Facial swelling  . Penicillins Itching, Swelling and Rash    Face swells and break out  . Strawberry Extract Swelling and Other (See Comments)    Facial swelling      Medication List       This list is accurate as of: 09/26/15 10:59 AM.  Always use your most recent med list.               acetaminophen 325 MG tablet  Commonly known as:  TYLENOL  Take 650 mg by mouth every 4 (four) hours as needed for mild pain, moderate pain, fever or headache.     aspirin 81 MG tablet  Take 81 mg by mouth daily.     atorvastatin 40 MG tablet  Commonly known as:  LIPITOR  Take 40 mg by mouth daily. For Hyperlipidemia     cetirizine 10 MG tablet  Commonly known as:  ZYRTEC  Take 10 mg by mouth daily.     cromolyn 4 % ophthalmic solution  Commonly known as:  OPTICROM  Place 1 drop into both eyes 4 (four) times daily.     DSS 100 MG Caps  Take 100 mg by mouth 2 (two) times daily.     ferrous sulfate 325 (65 FE) MG tablet  Take 325 mg by mouth daily with breakfast.     hydrALAZINE 25 MG tablet  Commonly known as:  APRESOLINE  Take 25 mg by mouth 4 (four) times daily.     hydrochlorothiazide 25 MG tablet  Commonly known as:  HYDRODIURIL  Take 25 mg by mouth every  morning.     insulin aspart 100 UNIT/ML injection  Commonly known as:  novoLOG  Inject 3 Units into the skin 3 (three) times daily before meals. Hold If BS >100     insulin glargine 100 UNIT/ML injection  Commonly known as:  LANTUS  Inject 40 Units into the skin at bedtime.     lisinopril 20 MG tablet  Commonly known as:  PRINIVIL,ZESTRIL  Take 1  tablet (20 mg total) by mouth daily.     loperamide 2 MG tablet  Commonly known as:  IMODIUM A-D  Take 2 mg by mouth every 4 (four) hours as needed for diarrhea or loose stools.     oxyCODONE-acetaminophen 5-325 MG tablet  Commonly known as:  PERCOCET/ROXICET  Take two tablets by mouth every 4 hours as needed for pain. Do not exceed 4gm of APAP in 24 hours     polyethylene glycol packet  Commonly known as:  MIRALAX / GLYCOLAX  Take 17 g by mouth daily as needed for moderate constipation.        Review of Systems  Immunization History  Administered Date(s) Administered  . Influenza-Unspecified 02/28/2015  . PPD Test 02/28/2015  . Pneumococcal Polysaccharide-23 01/17/2012   Pertinent  Health Maintenance Due  Topic Date Due  . OPHTHALMOLOGY EXAM  10/25/1964  . PAP SMEAR  10/26/1975  . COLONOSCOPY  10/25/2004  . MAMMOGRAM  03/18/2012  . INFLUENZA VACCINE  12/18/2015  . HEMOGLOBIN A1C  01/01/2016  . FOOT EXAM  06/27/2016   Fall Risk  06/28/2015  Falls in the past year? No   Functional Status Survey:    Filed Vitals:   09/26/15 0937  BP: 120/62  Pulse: 64  Temp: 98.6 F (37 C)  TempSrc: Oral  Resp: 18  Height: 5\' 6"  (1.676 m)  Weight: 257 lb (116.574 kg)  SpO2: 95%   Body mass index is 41.5 kg/(m^2). Physical Exam  Constitutional: She appears well-developed and well-nourished. No distress.  Over weight  HENT:  Head: Normocephalic and atraumatic.  Right Ear: External ear normal.  Left Ear: External ear normal.  Musculoskeletal: She exhibits edema and tenderness.  Right shoulder pain with limited ROM, feeds  self with the left hand. R+L AKA.  Skin: She is not diaphoretic.    Labs reviewed:  Recent Labs  12/31/14 1845 07/04/15 08/20/15  NA 138 140 143  K 4.3 4.5 4.3  CL 104  --   --   CO2 25  --   --   GLUCOSE 216*  --   --   BUN 30* 35* 28*  CREATININE 0.88 1.0 1.2*  CALCIUM 8.7*  --   --     Recent Labs  12/31/14 1845 07/04/15  AST 15 7*  ALT 19 8  ALKPHOS 146* 172*  BILITOT 0.2*  --   PROT 6.9  --   ALBUMIN 3.0*  --     Recent Labs  12/31/14 1845  08/17/15 08/20/15 09/10/15  WBC 11.1*  < > 10.4 10.3 11.2  NEUTROABS 7.6  --   --   --   --   HGB 10.7*  < > 9.0* 8.7* 9.6*  HCT 33.9*  < > 30* 30* 32*  MCV 83.7  --   --   --   --   PLT 239  < > 294 290 300  < > = values in this interval not displayed. Lab Results  Component Value Date   TSH 0.573 01/07/2012   Lab Results  Component Value Date   HGBA1C 7.8 07/04/2015   Lab Results  Component Value Date   CHOL 121 07/04/2015   HDL 31* 07/04/2015   LDLCALC 62 07/04/2015   TRIG 143 07/04/2015   CHOLHDL 4.8 01/10/2013    Significant Diagnostic Results in last 30 days:  No results found.  Assessment/Plan  Constipation Stable, continue MiraLax qd prn, Colace bid.    Depression Her mood is partial  controlled with Depakote 250mg  qhs, 08/20/15 valproic acid level 33.1   DM type 2, uncontrolled, with neuropathy ([place]) 07/04/15 Hgb a1c 7.8, continue Lantus 35u daily, Novolog 3 u with meals for CBG>100, pain is managed with prn Tylenol and Percocet q4h  HTN (hypertension) Blood pressure is controlled, continue Hydralazine 25mg  qid, Zestril 20mg  qd, HCTZ 25mg  qd, 08/20/15 Na 143, K 4.3, Bun 28, cret 1.15   Normocytic anemia 08/20/15 Hgb 8.7 09/10/15 Hgb 9.6 Continue Fe 325mg  daily.      Family/ staff Communication: continue SNF for care needs  Labs/tests ordered:  none

## 2015-09-26 NOTE — Assessment & Plan Note (Signed)
Stable, continue MiraLax qd prn, Colace bid.

## 2015-09-26 NOTE — Assessment & Plan Note (Signed)
Her mood is partial controlled with Depakote 250mg  qhs, 08/20/15 valproic acid level 33.1

## 2015-09-26 NOTE — Assessment & Plan Note (Signed)
08/20/15 Hgb 8.7 09/10/15 Hgb 9.6 Continue Fe 325mg  daily.

## 2015-09-26 NOTE — Assessment & Plan Note (Signed)
Blood pressure is controlled, continue Hydralazine 25mg  qid, Zestril 20mg  qd, HCTZ 25mg  qd, 08/20/15 Na 143, K 4.3, Bun 28, cret 1.15

## 2015-10-24 ENCOUNTER — Non-Acute Institutional Stay (SKILLED_NURSING_FACILITY): Payer: Medicare Other | Admitting: Nurse Practitioner

## 2015-10-24 DIAGNOSIS — F329 Major depressive disorder, single episode, unspecified: Secondary | ICD-10-CM

## 2015-10-24 DIAGNOSIS — G609 Hereditary and idiopathic neuropathy, unspecified: Secondary | ICD-10-CM | POA: Diagnosis not present

## 2015-10-24 DIAGNOSIS — F32A Depression, unspecified: Secondary | ICD-10-CM

## 2015-10-24 DIAGNOSIS — I10 Essential (primary) hypertension: Secondary | ICD-10-CM

## 2015-10-24 DIAGNOSIS — D649 Anemia, unspecified: Secondary | ICD-10-CM | POA: Diagnosis not present

## 2015-10-24 DIAGNOSIS — I2510 Atherosclerotic heart disease of native coronary artery without angina pectoris: Secondary | ICD-10-CM

## 2015-10-24 NOTE — Assessment & Plan Note (Signed)
Her mood is partial controlled with Depakote 250mg  qhs, 08/20/15 valproic acid level 33.1

## 2015-10-24 NOTE — Assessment & Plan Note (Signed)
No angina since last seen, continue ASA 81mg  and Statin for cardiovascular risk reduction.

## 2015-10-24 NOTE — Assessment & Plan Note (Addendum)
Blood pressure is 174/74, HR 78,  continue Hydralazine 25mg  qid, increase Lisinopril from 20mg  to 40mg  qd, HCTZ 25mg  qd, 08/20/15 Na 143, K 4.3, Bun 28, cret 1.15. BP daily, re eval in one week. BMP one week

## 2015-10-24 NOTE — Assessment & Plan Note (Signed)
Stable, taking ASA

## 2015-10-24 NOTE — Assessment & Plan Note (Signed)
Pain is adequately managed with Oxycodone and Tylenol.

## 2015-10-24 NOTE — Progress Notes (Signed)
Patient ID: [name], female   DOB: August 19, 1955, 60 y.o.   MRN: [id number]  Location:  [facility] Room Number: [place] of Service:  SNF (31) Provider: Lennie Odor Kemani Heidel NP  GREEN, Viviann Spare, MD  Patient Care Team: Estill Dooms, MD as PCP - General (Internal Medicine) Renella Cunas, MD (Cardiology)  Extended Emergency Contact Information Primary Emergency Contact: Charlena Cross, [place] [address] [place] Phone: [phone] Work Phone: [phone] Relation: Niece Secondary Emergency Contact: [place] Phone: [phone] Relation: Sister  Code Status:  DNR Goals of care: Advanced Directive information Advanced Directives 09/26/2015  Does patient have an advance directive? No  Would patient like information on creating an advanced directive? No - patient declined information  Pre-existing out of facility DNR order (yellow form or pink MOST form) -     Chief Complaint  Patient presents with  . Medical Management of Chronic Issues    HPI:  Pt is a 60 y.o. female seen today for medical management of chronic diseases. Hx of T2DM, on insulin last Hgb a1c 7.8 07/04/15, noted Hgb dropped from 10.7 12/31/14 to 8.7 07/04/15, then improved 09/10/15 9.6.  no noted active bleed, taking Depakote for depressive mood, 08/20/15 valproic acid level 33.1, neuropathic pain is managed with prn Tylenol and Percocet, blood pressure is not well controlled on Hydralazine, Zestril, and HCT. Constipation is stable on MiraLax prn and Colace.   Past Medical History  Diagnosis Date  . Atypical chest pain     a. non obstructive by cath in 2008 and 2010;  b. 02/2012 Myoview: non-ischemic, EF 57%  . HTN (hypertension)   . Hyperlipidemia   . Syncope and collapse     a. near-syncopal episode in November 2008;  b. 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  . CVA (cerebral infarction)     a.  Small right  parietal noted incidentally 04/2007;  b. right sided embolic CVA [id number];  c. 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 Peripheral neuropathy ([place])   . Obesity, unspecified   . Esophageal reflux   . Diabetes mellitus, type II ([place])   . Diaphragmatic hernia without mention of obstruction or gangrene   . Irritable bowel syndrome   . Morbid obesity ([place])   . Cellulitis     a. 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 Stroke [facility])     2013-'no residual'  . Vertigo   . Dry skin   . Constipation   . Tobacco abuse   . Hx of BKA     Bilateral  . Pain in shoulder 06/28/2015   Past Surgical History  Procedure Laterality Date  . Invasive electrophysiologic study  5/09    followed by insertion of an implantable loop recorder. S/p removal   . Colonoscopy    . Cholecystectomy    . Multiple toe surgeries    . Cardiac catheterization      LAD 30%, circumflex 50%, OM 75%, RI 60% with small branch 80%, dominant RCA 60%, EF 45-50%  . I&d extremity  12/23/2011    Procedure: IRRIGATION AND DEBRIDEMENT EXTREMITY;  Surgeon: Augustin Schooling, MD;  Location: [facility];  Service: Orthopedics;  Laterality: Left;  Left Foot  . I&d extremity  12/26/2011    Procedure:  IRRIGATION AND DEBRIDEMENT EXTREMITY;  Surgeon: Wylene Simmer, MD;  Location: [facility];  Service: Orthopedics;  Laterality: Left;  LEFT FOOT I&D WITH POSSIBLE WOUND VAC APPLICATION, POSSIBLE LEFT FIRST RAY AMPUTATION  . Amputation  12/30/2011    Procedure: AMPUTATION RAY;  Surgeon: Wylene Simmer, MD;  Location: [facility];  Service: Orthopedics;  Laterality: Left;  LEFT FIRST RAY AMPUTATION  . Eye surgery      cataract  . Amputation  01/13/2012    Procedure: AMPUTATION BELOW KNEE;  Surgeon: Wylene Simmer, MD;  Location: [facility];  Service: Orthopedics;  Laterality: Left;  . Amputation  03/04/2012    Procedure: AMPUTATION BELOW KNEE;  Surgeon: Wylene Simmer, MD;  Location: [facility];  Service:  Orthopedics;  Laterality: Left;  Revision of Left Below Knee Amputation  . Tee without cardioversion N/A 07/07/2012    Procedure: TRANSESOPHAGEAL ECHOCARDIOGRAM (TEE);  Surgeon: Lelon Perla, MD;  Location[facility] ENDOSCOPY;  Service: Cardiovascular;  Laterality: N/A;  . I&d extremity Right 10/29/2013    Procedure: [id number] AND DEBRIDEMENT EXTREMITY;  Surgeon: Johnn Hai, MD;  Location: [facility];  Service: Orthopedics;  Laterality: Right;  . Amputation Right 11/02/2013    Procedure: AMPUTATION BELOW KNEE;  Surgeon: Newt Minion, MD;  Location: [place] [facility];  Service: Orthopedics;  Laterality: Right;  Right Below Knee Amputation    Allergies  Allergen Reactions  . Banana Swelling and Other (See Comments)    Facial swelling  . Penicillins Itching, Swelling and Rash    Face swells and break out  . Strawberry Extract Swelling and Other (See Comments)    Facial swelling      Medication List       This list is accurate as of: 10/24/15 11:59 PM.  Always use your most recent med list.               acetaminophen 325 MG tablet  Commonly known as:  TYLENOL  Take 650 mg by mouth every 4 (four) hours as needed for mild pain, moderate pain, fever or headache.     aspirin 81 MG tablet  Take 81 mg by mouth daily.     atorvastatin 40 MG tablet  Commonly known as:  LIPITOR  Take 40 mg by mouth daily. For Hyperlipidemia     cetirizine 10 MG tablet  Commonly known as:  ZYRTEC  Take 10 mg by mouth daily.     cromolyn 4 % ophthalmic solution  Commonly known as:  OPTICROM  Place 1 drop into both eyes 4 (four) times daily.     DSS 100 MG Caps  Take 100 mg by mouth 2 (two) times daily.     ferrous sulfate 325 (65 FE) MG tablet  Take 325 mg by mouth daily with breakfast.     hydrALAZINE 25 MG tablet  Commonly known as:  APRESOLINE  Take 25 mg by mouth 4 (four) times daily.     hydrochlorothiazide 25 MG tablet  Commonly known as:  HYDRODIURIL  Take 25 mg by mouth every morning.      insulin aspart 100 UNIT/ML injection  Commonly known as:  novoLOG  Inject 3 Units into the skin 3 (three) times daily before meals. Hold If BS >100     insulin glargine 100 UNIT/ML injection  Commonly known as:  LANTUS  Inject 40 Units into the skin at bedtime.     lisinopril 20 MG tablet  Commonly known as:  PRINIVIL,ZESTRIL  Take 1 tablet (20 mg total) by  mouth daily.     loperamide 2 MG tablet  Commonly known as:  IMODIUM A-D  Take 2 mg by mouth every 4 (four) hours as needed for diarrhea or loose stools.     oxyCODONE-acetaminophen 5-325 MG tablet  Commonly known as:  PERCOCET/ROXICET  Take two tablets by mouth every 4 hours as needed for pain. Do not exceed 4gm of APAP in 24 hours     polyethylene glycol packet  Commonly known as:  MIRALAX / GLYCOLAX  Take 17 g by mouth daily as needed for moderate constipation.        Review of Systems  Constitutional: Negative for activity change, appetite change, fatigue and unexpected weight change.  HENT: Negative for congestion and hearing loss.        Dental caries right mandible  Eyes: Positive for visual disturbance (corrective lenses).  Respiratory: Negative for cough and shortness of breath.   Cardiovascular: Negative for chest pain, palpitations and leg swelling.  Gastrointestinal: Negative for abdominal pain, diarrhea and constipation.  Endocrine:       Diabetic. Insulin using.  Genitourinary: Negative for dysuria and difficulty urinating.  Musculoskeletal: Positive for myalgias (to left stump). Negative for arthralgias.       History bilateral BKA Chronic right shoulder pain.  Skin: Negative for color change and wound.  Neurological: Negative for dizziness and weakness.  Psychiatric/Behavioral: Positive for dysphoric mood. Negative for behavioral problems, confusion and agitation.    Immunization History  Administered Date(s) Administered  . Influenza-Unspecified 02/28/2015  . PPD Test 02/28/2015  . Pneumococcal  Polysaccharide-23 01/17/2012   Pertinent  Health Maintenance Due  Topic Date Due  . OPHTHALMOLOGY EXAM  10/25/1964  . PAP SMEAR  10/26/1975  . COLONOSCOPY  10/25/2004  . MAMMOGRAM  03/18/2012  . INFLUENZA VACCINE  12/18/2015  . HEMOGLOBIN A1C  01/01/2016  . FOOT EXAM  06/27/2016   Fall Risk  06/28/2015  Falls in the past year? No   Functional Status Survey:    Filed Vitals:   10/24/15 1520  BP: 174/74  Pulse: 78  Temp: 98 F (36.7 C)  TempSrc: Oral  Resp: 18  Height: 5\' 4"  (1.626 m)  Weight: 256 lb (116.121 kg)   Body mass index is 43.92 kg/(m^2). Physical Exam  Constitutional: She appears well-developed and well-nourished. No distress.  Over weight  HENT:  Head: Normocephalic and atraumatic.  Right Ear: External ear normal.  Left Ear: External ear normal.  Musculoskeletal: She exhibits edema and tenderness.  Right shoulder pain with limited ROM, feeds self with the left hand. R+L BKA.  Skin: She is not diaphoretic.    Labs reviewed:  Recent Labs  12/31/14 1845 07/04/15 08/20/15  NA 138 140 143  K 4.3 4.5 4.3  CL 104  --   --   CO2 25  --   --   GLUCOSE 216*  --   --   BUN 30* 35* 28*  CREATININE 0.88 1.0 1.2*  CALCIUM 8.7*  --   --     Recent Labs  12/31/14 1845 07/04/15  AST 15 7*  ALT 19 8  ALKPHOS 146* 172*  BILITOT 0.2*  --   PROT 6.9  --   ALBUMIN 3.0*  --     Recent Labs  12/31/14 1845  08/17/15 08/20/15 09/10/15  WBC 11.1*  < > 10.4 10.3 11.2  NEUTROABS 7.6  --   --   --   --   HGB 10.7*  < > 9.0* 8.7*  9.6*  HCT 33.9*  < > 30* 30* 32*  MCV 83.7  --   --   --   --   PLT 239  < > 294 290 300  < > = values in this interval not displayed. Lab Results  Component Value Date   TSH 0.573 01/07/2012   Lab Results  Component Value Date   HGBA1C 7.8 07/04/2015   Lab Results  Component Value Date   CHOL 121 07/04/2015   HDL 31* 07/04/2015   LDLCALC 62 07/04/2015   TRIG 143 07/04/2015   CHOLHDL 4.8 01/10/2013    Significant  Diagnostic Results in last 30 days:  No results found.  Assessment/Plan  CAD, NATIVE VESSEL No angina since last seen, continue ASA 81mg  and Statin for cardiovascular risk reduction.   HTN (hypertension) Blood pressure is 174/74, HR 78,  continue Hydralazine 25mg  qid, increase Lisinopril from 20mg  to 40mg  qd, HCTZ 25mg  qd, 08/20/15 Na 143, K 4.3, Bun 28, cret 1.15. BP daily, re eval in one week. BMP one week    Depression Her mood is partial controlled with Depakote 250mg  qhs, 08/20/15 valproic acid level 33.1  Normocytic anemia 08/20/15 Hgb 8.7 09/10/15 Hgb 9.6 Continue Fe 325mg  daily  Hereditary and idiopathic peripheral neuropathy Pain is adequately managed with Oxycodone and Tylenol.   CVA (cerebral infarction) Stable, taking ASA    Family/ staff Communication: continue SNF for care needs  Labs/tests ordered:  BMP

## 2015-10-24 NOTE — Assessment & Plan Note (Signed)
08/20/15 Hgb 8.7 09/10/15 Hgb 9.6 Continue Fe 325mg  daily

## 2017-10-16 ENCOUNTER — Non-Acute Institutional Stay: Payer: Self-pay | Admitting: Internal Medicine

## 2017-10-27 NOTE — Progress Notes (Signed)
[facility] Note  10/28/2017     CHIEF COMPLAINT Patient presents for Retina Evaluation and Diabetic Eye Exam   HISTORY OF PRESENT ILLNESS: [name] is a 62 y.o. female who presents to the clinic today for:   HPI    Retina Evaluation    In both eyes.  This started years ago.  Duration of years.  Context:  distance vision, mid-range vision and near vision.  Treatments tried include eye drops.  I, the attending physician,  performed the HPI with the patient and updated documentation appropriately.          Diabetic Eye Exam    Vision is blurred for distance.  Diabetes characteristics include Type 2 and on insulin.  This started years ago.  Blood sugar level is controlled.  I, the attending physician,  performed the HPI with the patient and updated documentation appropriately.          Comments    62 y/o female pt referred by Dr. Herbert Deaner for concern of diabetic retinopathy w/edema OU.  LEE 05.17.19 w/Dr. Herbert Deaner.  VA blurred OU x many yrs (worse OD).  Denies pain, flashes, floaters.  Uses a gtt rarely, but unsure of what it is or what its for.  BS and A1C unknown.       Last edited by Bernarda Caffey, MD on 10/28/2017  1:08 PM. (History)    Pt states she was seen by Dr. Herbert Deaner previously; Pt states she is diabetic, states she is not well controlled; Pt states the highest CBG has gotten recently is 280's; Pt states she has bilateral below the knee amputations due to DM complications;   Referring physician: Monna Fam, MD [place], [place] [address]  HISTORICAL INFORMATION:   Selected notes from the MEDICAL RECORD NUMBER Referred by Dr. Monna Fam for concern of diabetic retinopathy with edema OU LEE: 05.17.19 (K. [facility]) [BCVA: OD: 20/40 OS: 20/25-1] Ocular Hx-NPDR with edema OU, DES OU, PVD OU, Pseudophakia OU PMH-DM (taking Levemir and Metformin), HTN, High Cholesterol, Current Smoker    CURRENT MEDICATIONS: Current  Outpatient Medications (Ophthalmic Drugs)  Medication Sig  . cromolyn (OPTICROM) 4 % ophthalmic solution Place 1 drop into both eyes 4 (four) times daily.   No current facility-administered medications for this visit.  (Ophthalmic Drugs)   Current Outpatient Medications (Other)  Medication Sig  . acetaminophen (TYLENOL) 325 MG tablet Take 650 mg by mouth every 4 (four) hours as needed for mild pain, moderate pain, fever or headache.  Marland Kitchen aspirin 81 MG tablet Take 81 mg by mouth daily.  Marland Kitchen atorvastatin (LIPITOR) 40 MG tablet Take 40 mg by mouth daily. For Hyperlipidemia  . cetirizine (ZYRTEC) 10 MG tablet Take 10 mg by mouth daily.  Marland Kitchen docusate sodium 100 MG CAPS Take 100 mg by mouth 2 (two) times daily.  . ferrous sulfate 325 (65 FE) MG tablet Take 325 mg by mouth daily with breakfast.  . hydrALAZINE (APRESOLINE) 25 MG tablet Take 25 mg by mouth 4 (four) times daily.  . hydrochlorothiazide (HYDRODIURIL) 25 MG tablet Take 25 mg by mouth every morning.  . insulin aspart (NOVOLOG) 100 UNIT/ML injection Inject 3 Units into the skin 3 (three) times daily before meals. Hold If BS >100  . insulin glargine (LANTUS) 100 UNIT/ML injection Inject 40 Units into the skin at bedtime.   Marland Kitchen lisinopril (PRINIVIL,ZESTRIL) 20 MG tablet Take 1 tablet (20 mg total) by mouth daily.  Marland Kitchen loperamide (  IMODIUM A-D) 2 MG tablet Take 2 mg by mouth every 4 (four) hours as needed for diarrhea or loose stools.  Marland Kitchen oxyCODONE-acetaminophen (PERCOCET/ROXICET) 5-325 MG tablet Take two tablets by mouth every 4 hours as needed for pain. Do not exceed 4gm of APAP in 24 hours  . polyethylene glycol (MIRALAX / GLYCOLAX) packet Take 17 g by mouth daily as needed for moderate constipation.   Current Facility-Administered Medications (Other)  Medication Route  . Bevacizumab (AVASTIN) SOLN 1.25 mg Intravitreal      REVIEW OF SYSTEMS: ROS    Positive for: Endocrine, Eyes   Negative for: Constitutional, Gastrointestinal, Neurological,  Skin, Genitourinary, Musculoskeletal, HENT, Cardiovascular, Respiratory, Psychiatric, Allergic/Imm, Heme/Lymph   Last edited by Matthew Folks, COA on 10/28/2017 11:27 AM. (History)       ALLERGIES Allergies  Allergen Reactions  . Banana Swelling and Other (See Comments)    Facial swelling  . Penicillins Itching, Swelling and Rash    Face swells and break out  . Strawberry Extract Swelling and Other (See Comments)    Facial swelling    PAST MEDICAL HISTORY Past Medical History:  Diagnosis Date  . Atypical chest pain    a. non obstructive by cath in 2008 and 2010;  b. 02/2012 Myoview: non-ischemic, EF 57%  . Cellulitis    a. 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 Constipation   . CVA (cerebral infarction)    a.  Small right parietal noted incidentally 04/2007;  b. right sided embolic CVA 08/7094;  c. 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 Diabetes mellitus, type II ([place])   . Diabetic retinopathy ([place])    NPDR w/edema OU  . Diaphragmatic hernia without mention of obstruction or gangrene   . Dry skin   . Esophageal reflux   . HTN (hypertension)   . 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 Hyperlipidemia   . Irritable bowel syndrome   . Morbid obesity ([place])   . Obesity, unspecified   . Pain in shoulder 06/28/2015  . Peripheral neuropathy   . Stroke [facility])    2013-'no residual'  . Syncope and collapse    a. near-syncopal episode in November 2008;  b. 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  . Tobacco abuse   . Vertigo    Past Surgical History:  Procedure Laterality Date  . AMPUTATION  12/30/2011   Procedure: AMPUTATION RAY;  Surgeon: Wylene Simmer, MD;  Location: [place];  Service: Orthopedics;  Laterality: Left;  LEFT FIRST RAY AMPUTATION  . AMPUTATION  01/13/2012   Procedure: AMPUTATION BELOW KNEE;  Surgeon: Wylene Simmer, MD;  Location: [place];  Service: Orthopedics;  Laterality: Left;  .  AMPUTATION  03/04/2012   Procedure: AMPUTATION BELOW KNEE;  Surgeon: Wylene Simmer, MD;  Location: [place];  Service: Orthopedics;  Laterality: Left;  Revision of Left Below Knee Amputation  . AMPUTATION Right 11/02/2013   Procedure: AMPUTATION BELOW KNEE;  Surgeon: Newt Minion, MD;  Location: [place];  Service: Orthopedics;  Laterality: Right;  Right Below Knee Amputation  . CARDIAC CATHETERIZATION     LAD 30%, circumflex 50%, OM 75%, RI 60% with small branch 80%, dominant RCA 60%, EF 45-50%  . CHOLECYSTECTOMY    . COLONOSCOPY    . EYE SURGERY Right    cataract  . I&D EXTREMITY  12/23/2011   Procedure: IRRIGATION AND  DEBRIDEMENT EXTREMITY;  Surgeon: Augustin Schooling, MD;  Location: [place];  Service: Orthopedics;  Laterality: Left;  Left Foot  . I&D EXTREMITY  12/26/2011   Procedure: IRRIGATION AND DEBRIDEMENT EXTREMITY;  Surgeon: Wylene Simmer, MD;  Location: [place];  Service: Orthopedics;  Laterality: Left;  LEFT FOOT I&D WITH POSSIBLE WOUND VAC APPLICATION, POSSIBLE LEFT FIRST RAY AMPUTATION  . I&D EXTREMITY Right 10/29/2013   Procedure: IRRIGATION AND DEBRIDEMENT EXTREMITY;  Surgeon: Johnn Hai, MD;  Location: [facility];  Service: Orthopedics;  Laterality: Right;  . Invasive Electrophysiologic Study  5/09   followed by insertion of an implantable loop recorder. S/p removal   . Multiple Toe Surgeries    . TEE WITHOUT CARDIOVERSION N/A 07/07/2012   Procedure: TRANSESOPHAGEAL ECHOCARDIOGRAM (TEE);  Surgeon: Lelon Perla, MD;  Location: [facility] ENDOSCOPY;  Service: Cardiovascular;  Laterality: N/A;    FAMILY HISTORY Family History  Problem Relation Age of Onset  . Pneumonia Mother   . Gallbladder disease Mother        cancer  . Heart failure Mother   . Diabetes Father   . Coronary artery disease Father   . Stroke Neg Hx     SOCIAL HISTORY Social History   Tobacco Use  . Smoking status: Current Every Day Smoker    Packs/day: 0.25    Years: 40.00    Pack years: 10.00    Types: Cigarettes  .  Smokeless tobacco: Never Used  Substance Use Topics  . 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  . Drug use: No         OPHTHALMIC EXAM:  Base Eye Exam    Visual Acuity (Snellen - Linear)      Right Left   Dist [place] 20/80 -2 20/40   Dist ph [place] 20/60 -2 NI       Tonometry (Tonopen, 11:27 AM)      Right Left   Pressure 18 17       Pupils      Dark Light Shape React APD   Right 3 2 Round Minimal None   Left 3 2 Round Minimal None       Visual Fields (Counting fingers)      Left Right    Full Full       Extraocular Movement      Right Left    Full, Ortho Full, Ortho       Neuro/Psych    Oriented x3:  Yes   Mood/Affect:  Normal       Dilation    Both eyes:  1.0% Mydriacyl, 2.5% Phenylephrine @ 11:27 AM        Slit Lamp and Fundus Exam    Slit Lamp Exam      Right Left   Lids/Lashes Dermatochalasis - upper lid, Meibomian gland dysfunction, Scurf Dermatochalasis - upper lid, Meibomian gland dysfunction, Scurf   Conjunctiva/Sclera Nasal Pinguecula Nasal and temporal Pinguecula   Cornea Arcus, Debris in tear film Arcus, Debris in tear film   Anterior Chamber Deep and quiet Deep and quiet   Iris Round and dilated, No NVI Round and dilated, Transillumination defects at 0300, No NVI   Lens PC IOL in good position PC IOL in good position   Vitreous Vitreous syneresis Vitreous syneresis       Fundus Exam  Right Left   Disc Pink and Sharp, no NVD Pink and Sharp, no NVD   C/D Ratio 0.3 0.3   Macula Blunted foveal reflex, Superior Microaneurysms, Superior puncate Exudates Good foveal reflex, scattered Microaneurysms, Retinal pigment epithelial mottling   Vessels Vascular attenuation, Tortuous Vascular attenuation, Tortuous   Periphery Attached, cluster of IRH at 0200 mid-zone, scattered IRH 360 Attached, rare MA        Refraction    Manifest  Refraction      Sphere Cylinder Axis Dist VA   Right +1.75 +2.00 013 20/80   Left -0.50 +0.25 173 20/40          IMAGING AND PROCEDURES  Imaging and Procedures for @TODAY @  OCT, Retina - OU - Both Eyes       Right Eye Quality was good. Central Foveal Thickness: 320. Progression has no prior data. Findings include intraretinal fluid, inner retinal atrophy, abnormal foveal contour, no SRF, epiretinal membrane, vitreomacular adhesion .   Left Eye Quality was good. Central Foveal Thickness: 314. Progression has no prior data. Findings include normal foveal contour, intraretinal fluid, no SRF, retinal drusen  (Cystic changes inferior to fovea).   Notes *Images captured and stored on drive  Diagnosis / Impression:  DME OU; OD>OS OS non-central  Clinical management:  See below  Abbreviations: NFP - Normal foveal profile. CME - cystoid macular edema. PED - pigment epithelial detachment. IRF - intraretinal fluid. SRF - subretinal fluid. EZ - ellipsoid zone. ERM - epiretinal membrane. ORA - outer retinal atrophy. ORT - outer retinal tubulation. SRHM - subretinal hyper-reflective material         Intravitreal Injection, Pharmacologic Agent - OD - Right Eye       Time Out 10/28/2017. 12:28 PM. Confirmed correct patient, procedure, site, and patient consented.   Anesthesia Topical anesthesia was used. Anesthetic medications included Lidocaine 2%, Tetracaine 0.5%.   Procedure Preparation included 5% betadine to ocular surface, eyelid speculum. A supplied needle was used.   Injection: 1.25 mg Bevacizumab 1.25mg /0.45ml   NDC: 96295-284-13    Lot: [id number]     Expiration Date: 12/31/2017   Route: Intravitreal   Site: Right Eye   Waste: 0 mg  Post-op Post injection exam found visual acuity of at least counting fingers. The patient tolerated the procedure well. There were no complications. The patient received written and verbal post procedure care education.                  ASSESSMENT/PLAN:    ICD-10-CM   1. Moderate nonproliferative diabetic retinopathy of both eyes with macular edema associated with type 2 diabetes mellitus (HCC) 01/13/2018 Intravitreal Injection, Pharmacologic Agent - OD - Right Eye    Bevacizumab (AVASTIN) SOLN 1.25 mg  2. Retinal edema H35.81 OCT, Retina - OU - Both Eyes  3. Vitreomacular adhesion of right eye H43.821   4. Essential hypertension I10   5. Hypertensive retinopathy of both eyes H35.033   6. Pseudophakia of both eyes Z96.1     1,2. Moderate Non-proliferative diabetic retinopathy OU - The incidence, risk factors for progression, natural history and treatment options for diabetic retinopathy  were discussed with patient.   - The need for close monitoring of blood glucose, blood pressure, and serum lipids, avoiding cigarette or any type of tobacco, and the need for long term follow up was also discussed with patient. - exam shows [place] and clusters of exudates OU (OD more central involvement than OS) - OCT shows diabetic macular edema, both  eyes (OS noncentral)  OD also with some VMA that may be contributing to DME The natural history, pathology, and characteristics of diabetic macular edema discussed with patient.  A generalized discussion of the major clinical trials concerning treatment of diabetic macular edema (ETDRS, DCT, SCORE, RISE / RIDE, and ongoing DRCR net studies) was completed.  This discussion included mention of the various approaches to treating diabetic macular edema (observation, laser photocoagulation, anti-VEGF injections with lucentis / Avastin / Eylea, steroid injections with Kenalog / Ozurdex, and intraocular surgery with vitrectomy).  The goal hemoglobin A1C of 6-7 was discussed, as well as importance of smoking cessation and hypertension control.  Need for ongoing treatment and monitoring were specifically discussed with reference to chronic nature of diabetic macular edema. - recommend IVA #1 OD today,  (06.12.19) - pt wishes to proceed - RBA of procedure discussed, questions answered - informed consent obtained and signed - see procedure note - f/u in 4 wks, DFE, OCT, FA (Optos, transit OD)   3. Vitreomacular adhesion OD - no obvious traction on OCT - may be contributing to DME OD  4,5. Hypertensive retinopathy OU - discussed importance of tight BP control - monitor  6. Pseudophakia OU  - s/p CE/IOL OU  - beautiful surgerie, doing well  - monitor   Ophthalmic Meds Ordered this visit:  Meds ordered this encounter  Medications  . Bevacizumab (AVASTIN) SOLN 1.25 mg       Return in about 1 month (around 11/25/2017) for F/U PDR OU, DFE, OCT.  There are no Patient Instructions on file for this visit.   Explained the diagnoses, plan, and follow up with the patient and they expressed understanding.  Patient expressed understanding of the importance of proper follow up care.   This document serves as a record of services personally performed by Gardiner Sleeper, MD, PhD. It was created on their behalf by Ernest Mallick, OA, an ophthalmic assistant. The creation of this record is the provider's dictation and/or activities during the visit.    Electronically signed by: Ernest Mallick, OA  06.11.2019 11:07 PM   This document serves as a record of services personally performed by Gardiner Sleeper, MD, PhD. It was created on their behalf by Catha Brow, [place], a certified ophthalmic assistant. The creation of this record is the provider's dictation and/or activities during the visit.  Electronically signed by: Catha Brow, COA  06.12.19 11:07 PM      Gardiner Sleeper, M.D., Ph.D. Diseases & Surgery of the Retina and Vitreous [facility]   I have reviewed the above documentation for accuracy and completeness, and I agree with the above. Gardiner Sleeper, M.D., Ph.D. 11/01/17 11:07 PM    Abbreviations: M myopia (nearsighted); A astigmatism; H hyperopia  (farsighted); P presbyopia; Mrx spectacle prescription;  CTL contact lenses; OD right eye; OS left eye; OU both eyes  XT exotropia; ET esotropia; PEK punctate epithelial keratitis; PEE punctate epithelial erosions; DES dry eye syndrome; MGD meibomian gland dysfunction; ATs artificial tears; PFAT's preservative free artificial tears; [place] nuclear sclerotic cataract; PSC posterior subcapsular cataract; ERM epi-retinal membrane; PVD posterior vitreous detachment; RD retinal detachment; DM diabetes mellitus; DR diabetic retinopathy; NPDR non-proliferative diabetic retinopathy; PDR proliferative diabetic retinopathy; CSME clinically significant macular edema; DME diabetic macular edema; dbh dot blot hemorrhages; CWS cotton wool spot; POAG primary open angle glaucoma; C/D cup-to-disc ratio; HVF humphrey visual field; GVF goldmann visual field; OCT optical coherence tomography; IOP intraocular pressure; BRVO Branch  retinal vein occlusion; CRVO central retinal vein occlusion; CRAO central retinal artery occlusion; BRAO branch retinal artery occlusion; RT retinal tear; SB scleral buckle; PPV pars plana vitrectomy; VH Vitreous hemorrhage; PRP panretinal laser photocoagulation; IVK intravitreal kenalog; VMT vitreomacular traction; MH Macular hole;  NVD neovascularization of the disc; NVE neovascularization elsewhere; AREDS age related eye disease study; ARMD age related macular degeneration; POAG primary open angle glaucoma; EBMD epithelial/anterior basement membrane dystrophy; ACIOL anterior chamber intraocular lens; IOL intraocular lens; PCIOL posterior chamber intraocular lens; Phaco/IOL phacoemulsification with intraocular lens placement; [place] photorefractive keratectomy; LASIK laser assisted in situ keratomileusis; HTN hypertension; DM diabetes mellitus; COPD chronic obstructive pulmonary disease

## 2017-10-28 ENCOUNTER — Ambulatory Visit (INDEPENDENT_AMBULATORY_CARE_PROVIDER_SITE_OTHER): Payer: Medicare Other | Admitting: Ophthalmology

## 2017-10-28 ENCOUNTER — Encounter (INDEPENDENT_AMBULATORY_CARE_PROVIDER_SITE_OTHER): Payer: Self-pay | Admitting: Ophthalmology

## 2017-10-28 DIAGNOSIS — E113313 Type 2 diabetes mellitus with moderate nonproliferative diabetic retinopathy with macular edema, bilateral: Secondary | ICD-10-CM | POA: Diagnosis not present

## 2017-10-28 DIAGNOSIS — Z961 Presence of intraocular lens: Secondary | ICD-10-CM

## 2017-10-28 DIAGNOSIS — H3581 Retinal edema: Secondary | ICD-10-CM | POA: Diagnosis not present

## 2017-10-28 DIAGNOSIS — H43821 Vitreomacular adhesion, right eye: Secondary | ICD-10-CM

## 2017-10-28 DIAGNOSIS — I10 Essential (primary) hypertension: Secondary | ICD-10-CM

## 2017-10-28 DIAGNOSIS — H35033 Hypertensive retinopathy, bilateral: Secondary | ICD-10-CM

## 2017-11-01 ENCOUNTER — Encounter (INDEPENDENT_AMBULATORY_CARE_PROVIDER_SITE_OTHER): Payer: Self-pay | Admitting: Ophthalmology

## 2017-11-01 DIAGNOSIS — E113313 Type 2 diabetes mellitus with moderate nonproliferative diabetic retinopathy with macular edema, bilateral: Secondary | ICD-10-CM

## 2017-11-01 MED ORDER — BEVACIZUMAB CHEMO INJECTION 1.25MG/0.05ML SYRINGE FOR KALEIDOSCOPE
1.2500 mg | INTRAVITREAL | Status: DC
  Administered 2017-11-01: 1.25 mg via INTRAVITREAL

## 2017-11-09 DIAGNOSIS — G546 Phantom limb syndrome with pain: Secondary | ICD-10-CM | POA: Insufficient documentation

## 2017-11-09 DIAGNOSIS — R2689 Other abnormalities of gait and mobility: Secondary | ICD-10-CM | POA: Insufficient documentation

## 2017-11-09 NOTE — Progress Notes (Signed)
PALLIATIVE CARE CONSULT VISIT   PATIENT NAME: [name] DOB: 1955/12/03 MRN: [id number]  PRIMARY CARE PROVIDER:   Duffy Bruce, Manya Silvas, MD  REFERRING PROVIDER:  Dr. Darrel Reach, NP RESPONSIBLE PARTY:  Self and Tameka Williams(neice) [phone]    RECOMMENDATIONS and PLAN:  1.  Phantom limb pain G54.6:  Controlled with use of Gabapentin.  Continue to monitor.   2.  Abnormalities of gait and mobility R26.89:  Improved.  More mobile via wheel chair due to bilat. BKA and less bedbound status.  Encouragement given.  3.  Advanced care planning:  Full code and scope of treatment.  Plans are for long term residence however, pt would like to return to her home.   I spent 15 minutes providing this consultation,  from 3:45pm to 4:00pm at [facility]  More than 50% of the time in this consultation was spent coordinating communication with patient and clinical staff  HISTORY OF PRESENT ILLNESS: Follow-up with [name] finds that she has been more mobile in her wheelchair and has increased social participation.  No complaints today from patient or clinical staff.  No recent hospitalization or illnesses. CODE STATUS: FULL CODE  PPS: 40% HOSPICE ELIGIBILITY/DIAGNOSIS: TBD  PAST MEDICAL HISTORY:  Past Medical History:  Diagnosis Date  . Atypical chest pain    a. non obstructive by cath in 2008 and 2010;  b. 02/2012 Myoview: non-ischemic, EF 57%  . Cellulitis    a. 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 Constipation   . CVA (cerebral infarction)    a.  Small right parietal noted incidentally 04/2007;  b. right sided embolic CVA 12/6765;  c. 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 Diabetes mellitus, type II ([place])   . Diabetic retinopathy ([place])    NPDR w/edema OU  . Diaphragmatic hernia without mention of obstruction or gangrene   . Dry skin   . Esophageal reflux   .  HTN (hypertension)   . 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 Hyperlipidemia   . Irritable bowel syndrome   . Morbid obesity ([place])   . Obesity, unspecified   . Pain in shoulder 06/28/2015  . Peripheral neuropathy   . Stroke [facility])    2013-'no residual'  . Syncope and collapse    a. near-syncopal episode in November 2008;  b. 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  . Tobacco abuse   . Vertigo     SOCIAL HX:  Social History   Tobacco Use  . Smoking status: Current Every Day Smoker    Packs/day: 0.25    Years: 40.00    Pack years: 10.00    Types: Cigarettes  . Smokeless tobacco: Never Used  Substance Use Topics  . 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    ALLERGIES:  Allergies  Allergen Reactions  . Banana Swelling and Other (See Comments)    Facial swelling  . Penicillins Itching, Swelling and Rash    Face swells and break out  . Strawberry Extract Swelling and Other (See Comments)    Facial swelling     PERTINENT MEDICATIONS:  Outpatient Encounter Medications as of 10/16/2017  Medication Sig  . acetaminophen (TYLENOL) 325 MG  tablet Take 650 mg by mouth every 4 (four) hours as needed for mild pain, moderate pain, fever or headache.  Marland Kitchen aspirin 81 MG tablet Take 81 mg by mouth daily.  Marland Kitchen atorvastatin (LIPITOR) 40 MG tablet Take 40 mg by mouth daily. For Hyperlipidemia  . cetirizine (ZYRTEC) 10 MG tablet Take 10 mg by mouth daily.  . cromolyn (OPTICROM) 4 % ophthalmic solution Place 1 drop into both eyes 4 (four) times daily.  Marland Kitchen docusate sodium 100 MG CAPS Take 100 mg by mouth 2 (two) times daily.  . ferrous sulfate 325 (65 FE) MG tablet Take 325 mg by mouth daily with breakfast.  . hydrALAZINE (APRESOLINE) 25 MG tablet Take 25 mg by mouth 4 (four) times daily.  . hydrochlorothiazide (HYDRODIURIL) 25 MG tablet Take 25 mg by mouth every morning.  . insulin aspart  (NOVOLOG) 100 UNIT/ML injection Inject 3 Units into the skin 3 (three) times daily before meals. Hold If BS >100  . insulin glargine (LANTUS) 100 UNIT/ML injection Inject 40 Units into the skin at bedtime.   Marland Kitchen lisinopril (PRINIVIL,ZESTRIL) 20 MG tablet Take 1 tablet (20 mg total) by mouth daily.  Marland Kitchen loperamide (IMODIUM A-D) 2 MG tablet Take 2 mg by mouth every 4 (four) hours as needed for diarrhea or loose stools.  Marland Kitchen oxyCODONE-acetaminophen (PERCOCET/ROXICET) 5-325 MG tablet Take two tablets by mouth every 4 hours as needed for pain. Do not exceed 4gm of APAP in 24 hours  . polyethylene glycol (MIRALAX / GLYCOLAX) packet Take 17 g by mouth daily as needed for moderate constipation.   No facility-administered encounter medications on file as of 10/16/2017.     PHYSICAL EXAM:   General: NAD, Obese female resting in bed Cardiovascular: regular rate and rhythm Pulmonary: clear ant fields Abdomen: soft, nontender, + bowel sounds GU: no suprapubic tenderness Extremities: Bilat. BKA Skin: Exposed skin is intact Neurological: A&O x 3.   Psych:   Pleasant mood and good eye contact.  Gonzella Lex, NP-C

## 2017-11-30 NOTE — Progress Notes (Signed)
[facility] Note  12/01/2017     CHIEF COMPLAINT Patient presents for Retina Follow Up   HISTORY OF PRESENT ILLNESS: [name] is a 62 y.o. female who presents to the clinic today for:   HPI    Retina Follow Up    Patient presents with  Diabetic Retinopathy.  In both eyes.  This started 3 weeks ago.  Severity is mild.  Since onset it is gradually worsening.  I, the attending physician,  performed the HPI with the patient and updated documentation appropriately.          Comments    F/U PDR OU. Patient states her vision has become worse, " I have blurred vision and watery eyes all the time". Pt states nursing facility occasionally applies eye gtt's (rx unknown). Bs 210 this am, A1C 6.5(10/30/17). Pt is on insulin and Metformin.        Last edited by Bernarda Caffey, MD on 12/01/2017 11:05 AM. (History)    Pt states she was seen by Dr. Herbert Deaner previously; Pt states she is diabetic, states she is not well controlled; Pt states the highest CBG has gotten recently is 280's; Pt states she has bilateral below the knee amputations due to DM complications;   Referring physician: Duffy Bruce, Manya Silvas, MD [place], [place] [id number]  HISTORICAL INFORMATION:   Selected notes from the MEDICAL RECORD NUMBER Referred by Dr. Monna Fam for concern of diabetic retinopathy with edema OU LEE: 05.17.19 (K. Hecker) [BCVA: OD: 20/40 OS: 20/25-1] Ocular Hx-NPDR with edema OU, DES OU, PVD OU, Pseudophakia OU PMH-DM (taking Levemir and Metformin), HTN, High Cholesterol, Current Smoker    CURRENT MEDICATIONS: Current Outpatient Medications (Ophthalmic Drugs)  Medication Sig  . cromolyn (OPTICROM) 4 % ophthalmic solution Place 1 drop into both eyes 4 (four) times daily.   No current facility-administered medications for this visit.  (Ophthalmic Drugs)   Current Outpatient Medications (Other)  Medication Sig  . acetaminophen (TYLENOL) 325 MG  tablet Take 650 mg by mouth every 4 (four) hours as needed for mild pain, moderate pain, fever or headache.  Marland Kitchen aspirin 81 MG tablet Take 81 mg by mouth daily.  Marland Kitchen atorvastatin (LIPITOR) 40 MG tablet Take 40 mg by mouth daily. For Hyperlipidemia  . cetirizine (ZYRTEC) 10 MG tablet Take 10 mg by mouth daily.  Marland Kitchen docusate sodium 100 MG CAPS Take 100 mg by mouth 2 (two) times daily.  . ferrous sulfate 325 (65 FE) MG tablet Take 325 mg by mouth daily with breakfast.  . hydrALAZINE (APRESOLINE) 25 MG tablet Take 25 mg by mouth 4 (four) times daily.  . hydrochlorothiazide (HYDRODIURIL) 25 MG tablet Take 25 mg by mouth every morning.  . insulin aspart (NOVOLOG) 100 UNIT/ML injection Inject 3 Units into the skin 3 (three) times daily before meals. Hold If BS >100  . insulin glargine (LANTUS) 100 UNIT/ML injection Inject 40 Units into the skin at bedtime.   Marland Kitchen lisinopril (PRINIVIL,ZESTRIL) 20 MG tablet Take 1 tablet (20 mg total) by mouth daily.  Marland Kitchen loperamide (IMODIUM A-D) 2 MG tablet Take 2 mg by mouth every 4 (four) hours as needed for diarrhea or loose stools.  Marland Kitchen oxyCODONE-acetaminophen (PERCOCET/ROXICET) 5-325 MG tablet Take two tablets by mouth every 4 hours as needed for pain. Do not exceed 4gm of APAP in 24 hours  . polyethylene glycol (MIRALAX / GLYCOLAX) packet Take 17 g by mouth daily as needed for moderate constipation.  Current Facility-Administered Medications (Other)  Medication Route  . Bevacizumab (AVASTIN) SOLN 1.25 mg Intravitreal  . Bevacizumab (AVASTIN) SOLN 1.25 mg Intravitreal      REVIEW OF SYSTEMS: ROS    Positive for: Endocrine, Eyes   Negative for: Constitutional, Gastrointestinal, Neurological, Skin, Genitourinary, Musculoskeletal, HENT, Cardiovascular, Respiratory, Psychiatric, Allergic/Imm, Heme/Lymph   Last edited by Zenovia Jordan, LPN on 5/63/1497 02:63 AM. (History)       ALLERGIES Allergies  Allergen Reactions  . Banana Swelling and Other (See Comments)     Facial swelling  . Penicillins Itching, Swelling and Rash    Face swells and break out  . Strawberry Extract Swelling and Other (See Comments)    Facial swelling    PAST MEDICAL HISTORY Past Medical History:  Diagnosis Date  . Atypical chest pain    a. non obstructive by cath in 2008 and 2010;  b. 02/2012 Myoview: non-ischemic, EF 57%  . Cellulitis    a. 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 Constipation   . CVA (cerebral infarction)    a.  Small right parietal noted incidentally 04/2007;  b. right sided embolic CVA 11/8586;  c. 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 Diabetes mellitus, type II ([place])   . Diabetic retinopathy ([place] [place])    NPDR w/edema OU  . Diaphragmatic hernia without mention of obstruction or gangrene   . Dry skin   . Esophageal reflux   . HTN (hypertension)   . 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 Hyperlipidemia   . Irritable bowel syndrome   . Morbid obesity ([place])   . Obesity, unspecified   . Pain in shoulder 06/28/2015  . Peripheral neuropathy   . Stroke [facility])    2013-'no residual'  . Syncope and collapse    a. near-syncopal episode in November 2008;  b. 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  . Tobacco abuse   . Vertigo    Past Surgical History:  Procedure Laterality Date  . AMPUTATION  12/30/2011   Procedure: AMPUTATION RAY;  Surgeon: Wylene Simmer, MD;  Location: [place];  Service: Orthopedics;  Laterality: Left;  LEFT FIRST RAY AMPUTATION  . AMPUTATION  01/13/2012   Procedure: AMPUTATION BELOW KNEE;  Surgeon: Wylene Simmer, MD;  Location: [place];  Service: Orthopedics;  Laterality: Left;  . AMPUTATION  03/04/2012   Procedure: AMPUTATION BELOW KNEE;  Surgeon: Wylene Simmer, MD;  Location: [place];  Service: Orthopedics;  Laterality: Left;  Revision of Left Below Knee Amputation  . AMPUTATION Right 11/02/2013   Procedure: AMPUTATION BELOW KNEE;  Surgeon: Newt Minion, MD;   Location: [place];  Service: Orthopedics;  Laterality: Right;  Right Below Knee Amputation  . CARDIAC CATHETERIZATION     LAD 30%, circumflex 50%, OM 75%, RI 60% with small branch 80%, dominant RCA 60%, EF 45-50%  . CHOLECYSTECTOMY    . COLONOSCOPY    . EYE SURGERY Right    cataract  . I&D EXTREMITY  12/23/2011   Procedure: IRRIGATION AND DEBRIDEMENT EXTREMITY;  Surgeon: Augustin Schooling, MD;  Location: [place];  Service: Orthopedics;  Laterality: Left;  Left Foot  . I&D EXTREMITY  12/26/2011   Procedure: IRRIGATION AND DEBRIDEMENT EXTREMITY;  Surgeon: Wylene Simmer, MD;  Location: [place];  Service: Orthopedics;  Laterality: Left;  LEFT FOOT I&D WITH POSSIBLE WOUND VAC APPLICATION, POSSIBLE LEFT FIRST RAY AMPUTATION  .  I&D EXTREMITY Right 10/29/2013   Procedure: IRRIGATION AND DEBRIDEMENT EXTREMITY;  Surgeon: Johnn Hai, MD;  Location: [place];  Service: Orthopedics;  Laterality: Right;  . Invasive Electrophysiologic Study  5/09   followed by insertion of an implantable loop recorder. S/p removal   . Multiple Toe Surgeries    . TEE WITHOUT CARDIOVERSION N/A 07/07/2012   Procedure: TRANSESOPHAGEAL ECHOCARDIOGRAM (TEE);  Surgeon: Lelon Perla, MD;  Location: [facility] ENDOSCOPY;  Service: Cardiovascular;  Laterality: N/A;    FAMILY HISTORY Family History  Problem Relation Age of Onset  . Pneumonia Mother   . Gallbladder disease Mother        cancer  . Heart failure Mother   . Diabetes Father   . Coronary artery disease Father   . Stroke Neg Hx     SOCIAL HISTORY Social History   Tobacco Use  . Smoking status: Current Every Day Smoker    Packs/day: 0.25    Years: 40.00    Pack years: 10.00    Types: Cigarettes  . Smokeless tobacco: Never Used  Substance Use Topics  . 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  . Drug use: No         OPHTHALMIC EXAM:  Base Eye Exam    Visual  Acuity (Snellen - Linear)      Right Left   Dist [place] 20/50 20/30 +2   Dist ph [place] NI 20/30       Tonometry (Tonopen, 10:35 AM)      Right Left   Pressure 18 16       Pupils      Dark Light Shape React APD   Right 4 3 Round Brisk None   Left 4 3 Round Brisk None       Visual Fields (Counting fingers)      Left Right     Full       Extraocular Movement      Right Left    Full, Ortho Full, Ortho       Neuro/Psych    Oriented x3:  Yes   Mood/Affect:  Normal       Dilation    Both eyes:  2.5% Phenylephrine @ 10:35 AM        Slit Lamp and Fundus Exam    Slit Lamp Exam      Right Left   Lids/Lashes Dermatochalasis - upper lid, Meibomian gland dysfunction, Scurf Dermatochalasis - upper lid, Meibomian gland dysfunction, Scurf   Conjunctiva/Sclera Nasal Pinguecula Nasal and temporal Pinguecula   Cornea Arcus, Debris in tear film Arcus, Debris in tear film   Anterior Chamber Deep and quiet Deep and quiet   Iris Round and dilated, No NVI Round and dilated, Transillumination defects at 0300, No NVI   Lens PC IOL in good position PC IOL in good position   Vitreous Vitreous syneresis Vitreous syneresis       Fundus Exam      Right Left   Disc Pink and Sharp, no NVD, peripapillary hemorrhage at 0300 Pink and Sharp, no NVD   C/D Ratio 0.3 0.3   Macula Blunted foveal reflex, Superior Microaneurysms, Superior puncate Exudates -- interval improvement Good foveal reflex, scattered Microaneurysms, Retinal pigment epithelial mottling   Vessels Vascular attenuation, Tortuous Vascular attenuation, Tortuous   Periphery Attached, cluster of IRH at  0200 mid-zone, scattered IRH 360, scattered DBH and exudates nasal to disc Attached, rare MA, blot hemorrhage at 0100 close to disc          IMAGING AND PROCEDURES  Imaging and Procedures for @TODAY @  Fluorescein Angiography Optos (Transit OD)       Right Eye Progression has no prior data. Early phase findings include microaneurysm,  vascular perfusion defect. Mid/Late phase findings include microaneurysm, leakage, vascular perfusion defect (No NV).   Left Eye Progression has no prior data. Early phase findings include microaneurysm, vascular perfusion defect. Mid/Late phase findings include microaneurysm, leakage, vascular perfusion defect.   Notes *Images captured and stored on drive;   Impression: Scattered late leaking microaneurysms OU No NV OU Moderate-severe NPDR OU          Intravitreal Injection, Pharmacologic Agent - OD - Right Eye       Time Out 12/01/2017. 11:27 AM. Confirmed correct patient, procedure, site, and patient consented.   Anesthesia Topical anesthesia was used. Anesthetic medications included Lidocaine 2%, Tetracaine 0.5%.   Procedure Preparation included 5% betadine to ocular surface, eyelid speculum. A supplied needle was used.   Injection: 1.25 mg Bevacizumab 1.25mg /0.35ml   NDC: 71245-809-98    Lot: [id number]     Expiration Date: 02/18/2018   Route: Intravitreal   Site: Right Eye   Waste: 0 mg  Post-op Post injection exam found visual acuity of at least counting fingers. The patient tolerated the procedure well. There were no complications. The patient received written and verbal post procedure care education.                 ASSESSMENT/PLAN:    ICD-10-CM   1. Moderate nonproliferative diabetic retinopathy of both eyes with macular edema associated with type 2 diabetes mellitus (HCC) 23/07/2017 Fluorescein Angiography Optos (Transit OD)    Intravitreal Injection, Pharmacologic Agent - OD - Right Eye    Bevacizumab (AVASTIN) SOLN 1.25 mg    CANCELED: OCT, Retina - OU - Both Eyes  2. Retinal edema H35.81 CANCELED: OCT, Retina - OU - Both Eyes  3. Vitreomacular adhesion of right eye H43.821   4. Essential hypertension I10   5. Hypertensive retinopathy of both eyes H35.033 Fluorescein Angiography Optos (Transit OD)  6. Pseudophakia of both eyes Z96.1     1,2.  Moderate Non-proliferative diabetic retinopathy OU - exam shows IRH and clusters of exudates OU (OD more central involvement than OS) - initial OCT showed diabetic macular edema, both eyes (OS noncentral)  OD also with some VMA that may be contributing to DME - unable to obtain OCT today, 12/01/17 - FA today shows scattered leaking microaneurysms OU, no NV OU - S/P IVA OD #1 (06.12.19) - recommend IVA #2 OD today, (7.16.19) - pt wishes to proceed - RBA of procedure discussed, questions answered - informed consent obtained and signed - see procedure note - f/u in 4 wks, DFE, OCT, possible IVA   3. Vitreomacular adhesion OD - no obvious traction on OCT - may be contributing to DME OD  4,5. Hypertensive retinopathy OU - discussed importance of tight BP control - monitor  6. Pseudophakia OU  - s/p CE/IOL OU  - beautiful surgeries, doing well  - monitor   Ophthalmic Meds Ordered this visit:  Meds ordered this encounter  Medications  . Bevacizumab (AVASTIN) SOLN 1.25 mg       Return in about 1 month (around 12/29/2017) for F/U NPDR OU, DFE, OCT.  There are no Patient Instructions on  file for this visit.   Explained the diagnoses, plan, and follow up with the patient and they expressed understanding.  Patient expressed understanding of the importance of proper follow up care.   This document serves as a record of services personally performed by Gardiner Sleeper, MD, PhD. It was created on their behalf by Catha Brow, [place], a certified ophthalmic assistant. The creation of this record is the provider's dictation and/or activities during the visit.  Electronically signed by: Catha Brow, [place]  07.15.19 2:40 PM   Gardiner Sleeper, M.D., Ph.D. Diseases & Surgery of the Retina and Vitreous [facility]  I have reviewed the above documentation for accuracy and completeness, and I agree with the above. Gardiner Sleeper, M.D., Ph.D. 12/01/17 2:43  PM   Abbreviations: M myopia (nearsighted); A astigmatism; H hyperopia (farsighted); P presbyopia; Mrx spectacle prescription;  CTL contact lenses; OD right eye; OS left eye; OU both eyes  XT exotropia; ET esotropia; PEK punctate epithelial keratitis; PEE punctate epithelial erosions; DES dry eye syndrome; MGD meibomian gland dysfunction; ATs artificial tears; PFAT's preservative free artificial tears; [place] nuclear sclerotic cataract; PSC posterior subcapsular cataract; ERM epi-retinal membrane; PVD posterior vitreous detachment; RD retinal detachment; DM diabetes mellitus; DR diabetic retinopathy; NPDR non-proliferative diabetic retinopathy; PDR proliferative diabetic retinopathy; CSME clinically significant macular edema; DME diabetic macular edema; dbh dot blot hemorrhages; CWS cotton wool spot; POAG primary open angle glaucoma; C/D cup-to-disc ratio; HVF humphrey visual field; GVF goldmann visual field; OCT optical coherence tomography; IOP intraocular pressure; BRVO Branch retinal vein occlusion; CRVO central retinal vein occlusion; CRAO central retinal artery occlusion; BRAO branch retinal artery occlusion; RT retinal tear; SB scleral buckle; PPV pars plana vitrectomy; VH Vitreous hemorrhage; PRP panretinal laser photocoagulation; IVK intravitreal kenalog; VMT vitreomacular traction; MH Macular hole;  NVD neovascularization of the disc; NVE neovascularization elsewhere; AREDS age related eye disease study; ARMD age related macular degeneration; POAG primary open angle glaucoma; EBMD epithelial/anterior basement membrane dystrophy; ACIOL anterior chamber intraocular lens; IOL intraocular lens; PCIOL posterior chamber intraocular lens; Phaco/IOL phacoemulsification with intraocular lens placement; Colp photorefractive keratectomy; LASIK laser assisted in situ keratomileusis; HTN hypertension; DM diabetes mellitus; COPD chronic obstructive pulmonary disease

## 2017-12-01 ENCOUNTER — Ambulatory Visit (INDEPENDENT_AMBULATORY_CARE_PROVIDER_SITE_OTHER): Payer: Medicare Other | Admitting: Ophthalmology

## 2017-12-01 ENCOUNTER — Encounter (INDEPENDENT_AMBULATORY_CARE_PROVIDER_SITE_OTHER): Payer: Self-pay | Admitting: Ophthalmology

## 2017-12-01 DIAGNOSIS — H35033 Hypertensive retinopathy, bilateral: Secondary | ICD-10-CM | POA: Diagnosis not present

## 2017-12-01 DIAGNOSIS — E113313 Type 2 diabetes mellitus with moderate nonproliferative diabetic retinopathy with macular edema, bilateral: Secondary | ICD-10-CM

## 2017-12-01 DIAGNOSIS — I10 Essential (primary) hypertension: Secondary | ICD-10-CM

## 2017-12-01 DIAGNOSIS — H3581 Retinal edema: Secondary | ICD-10-CM

## 2017-12-01 DIAGNOSIS — H43821 Vitreomacular adhesion, right eye: Secondary | ICD-10-CM

## 2017-12-01 DIAGNOSIS — Z961 Presence of intraocular lens: Secondary | ICD-10-CM

## 2017-12-01 MED ORDER — BEVACIZUMAB CHEMO INJECTION 1.25MG/0.05ML SYRINGE FOR KALEIDOSCOPE
1.2500 mg | INTRAVITREAL | Status: DC
  Administered 2017-12-01: 1.25 mg via INTRAVITREAL

## 2017-12-29 NOTE — Progress Notes (Addendum)
[facility] Note  12/30/2017     CHIEF COMPLAINT Patient presents for Retina Follow Up   HISTORY OF PRESENT ILLNESS: [name] is a 62 y.o. female who presents to the clinic today for:   HPI    Retina Follow Up    Patient presents with  Diabetic Retinopathy.  In both eyes.  This started months ago.  Severity is moderate.  Duration of months.  Since onset it is stable.  I, the attending physician,  performed the HPI with the patient and updated documentation appropriately.          Comments    62 y/o female pt here for 1 mo f/u for NPDR w/mac edema OU.  No change in [place] OU.  Denies pain, flashes, floaters.  BS 191 this a.m.  A1C unknown.  Opticrom QD OU       Last edited by Bernarda Caffey, MD on 12/31/2017 11:56 PM. (History)      Referring physician: Duffy Bruce, Manya Silvas, MD [place], [place] [address]  HISTORICAL INFORMATION:   Selected notes from the MEDICAL RECORD NUMBER Referred by Dr. Monna Fam for concern of diabetic retinopathy with edema OU LEE: 05.17.19 (K. Hecker) [BCVA: OD: 20/40 OS: 20/25-1] Ocular Hx-NPDR with edema OU, DES OU, PVD OU, Pseudophakia OU PMH-DM (taking Levemir and Metformin), HTN, High Cholesterol, Current Smoker    CURRENT MEDICATIONS: Current Outpatient Medications (Ophthalmic Drugs)  Medication Sig  . cromolyn (OPTICROM) 4 % ophthalmic solution Place 1 drop into both eyes 4 (four) times daily.   No current facility-administered medications for this visit.  (Ophthalmic Drugs)   Current Outpatient Medications (Other)  Medication Sig  . acetaminophen (TYLENOL) 325 MG tablet Take 650 mg by mouth every 4 (four) hours as needed for mild pain, moderate pain, fever or headache.  Marland Kitchen aspirin 81 MG tablet Take 81 mg by mouth daily.  Marland Kitchen atorvastatin (LIPITOR) 40 MG tablet Take 40 mg by mouth daily. For Hyperlipidemia  . cetirizine (ZYRTEC) 10 MG tablet Take 10 mg by mouth daily.  Marland Kitchen docusate  sodium 100 MG CAPS Take 100 mg by mouth 2 (two) times daily.  . ferrous sulfate 325 (65 FE) MG tablet Take 325 mg by mouth daily with breakfast.  . hydrALAZINE (APRESOLINE) 25 MG tablet Take 25 mg by mouth 4 (four) times daily.  . hydrochlorothiazide (HYDRODIURIL) 25 MG tablet Take 25 mg by mouth every morning.  . insulin aspart (NOVOLOG) 100 UNIT/ML injection Inject 3 Units into the skin 3 (three) times daily before meals. Hold If BS >100  . insulin glargine (LANTUS) 100 UNIT/ML injection Inject 40 Units into the skin at bedtime.   Marland Kitchen lisinopril (PRINIVIL,ZESTRIL) 20 MG tablet Take 1 tablet (20 mg total) by mouth daily.  Marland Kitchen loperamide (IMODIUM A-D) 2 MG tablet Take 2 mg by mouth every 4 (four) hours as needed for diarrhea or loose stools.  Marland Kitchen oxyCODONE-acetaminophen (PERCOCET/ROXICET) 5-325 MG tablet Take two tablets by mouth every 4 hours as needed for pain. Do not exceed 4gm of APAP in 24 hours  . polyethylene glycol (MIRALAX / GLYCOLAX) packet Take 17 g by mouth daily as needed for moderate constipation.   Current Facility-Administered Medications (Other)  Medication Route  . Bevacizumab (AVASTIN) SOLN 1.25 mg Intravitreal  . Bevacizumab (AVASTIN) SOLN 1.25 mg Intravitreal  . Bevacizumab (AVASTIN) SOLN 1.25 mg Intravitreal      REVIEW OF SYSTEMS: ROS    Positive for: Endocrine, Eyes  Negative for: Constitutional, Gastrointestinal, Neurological, Skin, Genitourinary, Musculoskeletal, HENT, Cardiovascular, Respiratory, Psychiatric, Allergic/Imm, Heme/Lymph   Last edited by [name], COA on 12/30/2017 10:30 AM. (History)       ALLERGIES Allergies  Allergen Reactions  . Banana Swelling and Other (See Comments)    Facial swelling  . Penicillins Itching, Swelling and Rash    Face swells and break out  . Strawberry Extract Swelling and Other (See Comments)    Facial swelling    PAST MEDICAL HISTORY Past Medical History:  Diagnosis Date  . Atypical chest pain    a. non  obstructive by cath in 2008 and 2010;  b. 02/2012 Myoview: non-ischemic, EF 57%  . Cellulitis    a. 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 Constipation   . CVA (cerebral infarction)    a.  Small right parietal noted incidentally 04/2007;  b. right sided embolic CVA 01/6788;  c. 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 Diabetes mellitus, type II ([place])   . Diabetic retinopathy ([place])    NPDR w/edema OU  . Diaphragmatic hernia without mention of obstruction or gangrene   . Dry skin   . Esophageal reflux   . HTN (hypertension)   . 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 Hyperlipidemia   . Irritable bowel syndrome   . Morbid obesity ([place])   . Obesity, unspecified   . Pain in shoulder 06/28/2015  . Peripheral neuropathy   . Stroke [facility])    2013-'no residual'  . Syncope and collapse    a. near-syncopal episode in November 2008;  b. 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  . Tobacco abuse   . Vertigo    Past Surgical History:  Procedure Laterality Date  . AMPUTATION  12/30/2011   Procedure: AMPUTATION RAY;  Surgeon: Wylene Simmer, MD;  Location: [place];  Service: Orthopedics;  Laterality: Left;  LEFT FIRST RAY AMPUTATION  . AMPUTATION  01/13/2012   Procedure: AMPUTATION BELOW KNEE;  Surgeon: Wylene Simmer, MD;  Location: [place];  Service: Orthopedics;  Laterality: Left;  . AMPUTATION  03/04/2012   Procedure: AMPUTATION BELOW KNEE;  Surgeon: Wylene Simmer, MD;  Location: [place];  Service: Orthopedics;  Laterality: Left;  Revision of Left Below Knee Amputation  . AMPUTATION Right 11/02/2013   Procedure: AMPUTATION BELOW KNEE;  Surgeon: Newt Minion, MD;  Location: [place];  Service: Orthopedics;  Laterality: Right;  Right Below Knee Amputation  . CARDIAC CATHETERIZATION     LAD 30%, circumflex 50%, OM 75%, RI 60% with small branch 80%, dominant RCA 60%, EF 45-50%  . CHOLECYSTECTOMY    . COLONOSCOPY    . EYE SURGERY  Right    cataract  . I&D EXTREMITY  12/23/2011   Procedure: IRRIGATION AND DEBRIDEMENT EXTREMITY;  Surgeon: Augustin Schooling, MD;  Location: [place];  Service: Orthopedics;  Laterality: Left;  Left Foot  . I&D EXTREMITY  12/26/2011   Procedure: IRRIGATION AND DEBRIDEMENT EXTREMITY;  Surgeon: Wylene Simmer, MD;  Location: [place];  Service: Orthopedics;  Laterality: Left;  LEFT FOOT I&D WITH POSSIBLE WOUND VAC APPLICATION, POSSIBLE LEFT FIRST RAY AMPUTATION  . I&D EXTREMITY Right 10/29/2013   Procedure: IRRIGATION AND DEBRIDEMENT EXTREMITY;  Surgeon: Johnn Hai, MD;  Location: [place];  Service: Orthopedics;  Laterality: Right;  . Invasive Electrophysiologic Study  5/09   followed by  insertion of an implantable loop recorder. S/p removal   . Multiple Toe Surgeries    . TEE WITHOUT CARDIOVERSION N/A 07/07/2012   Procedure: TRANSESOPHAGEAL ECHOCARDIOGRAM (TEE);  Surgeon: Lelon Perla, MD;  [facility] ENDOSCOPY;  Service: Cardiovascular;  Laterality: N/A;    FAMILY HISTORY Family History  Problem Relation Age of Onset  . Pneumonia Mother   . Gallbladder disease Mother        cancer  . Heart failure Mother   . Diabetes Father   . Coronary artery disease Father   . Stroke Neg Hx     SOCIAL HISTORY Social History   Tobacco Use  . Smoking status: Current Every Day Smoker    Packs/day: 0.25    Years: 40.00    Pack years: 10.00    Types: Cigarettes  . Smokeless tobacco: Never Used  Substance Use Topics  . 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  . Drug use: No         OPHTHALMIC EXAM:  Base Eye Exam    Visual Acuity (Snellen - Linear)      Right Left   Dist [place] 20/80 + 20/40   Dist ph [place] 20/60 -2 NI       Tonometry (Tonopen, 10:35 AM)      Right Left   Pressure 16 19       Pupils      Dark Light Shape React APD   Right 4 3 Round Brisk None   Left  4 3 Round Brisk None       Visual Fields (Counting fingers)      Left Right    Full Full       Extraocular Movement      Right Left    Full, Ortho Full, Ortho       Neuro/Psych    Oriented x3:  Yes   Mood/Affect:  Normal       Dilation    Both eyes:  1.0% Mydriacyl, 2.5% Phenylephrine @ 10:35 AM        Slit Lamp and Fundus Exam    Slit Lamp Exam      Right Left   Lids/Lashes Dermatochalasis - upper lid, Meibomian gland dysfunction, Scurf Dermatochalasis - upper lid, Meibomian gland dysfunction, Scurf   Conjunctiva/Sclera Nasal Pinguecula Nasal and temporal Pinguecula   Cornea Arcus, Debris in tear film Arcus, Debris in tear film   Anterior Chamber Deep and quiet Deep and quiet   Iris Round and dilated, No NVI Round and dilated, Transillumination defects at 0300, No NVI   Lens PC IOL in good position PC IOL in good position   Vitreous Vitreous syneresis Vitreous syneresis       Fundus Exam      Right Left   Disc Pink and Sharp, no NVD, peripapillary hemorrhage at 0300 Pink and Sharp, no NVD   C/D Ratio 0.3 0.3   Macula Blunted foveal reflex, persistent cystic changes, interval improvement in Superior Microaneurysms, interval improvement in Superior puncate Exudates Good foveal reflex, scattered Microaneurysms, Retinal pigment epithelial mottling   Vessels Vascular attenuation, Tortuous Vascular attenuation, Tortuous   Periphery Attached, cluster of IRH at 0200 mid-zone, scattered IRH 360, scattered DBH and exudates nasal to disc Attached, rare MA, blot hemorrhage at 0100 close to disc  IMAGING AND PROCEDURES  Imaging and Procedures for @TODAY @  OCT, Retina - OU - Both Eyes       Right Eye Quality was good. Central Foveal Thickness: 407. Progression has been stable. Findings include intraretinal fluid, inner retinal atrophy, abnormal foveal contour, no SRF, epiretinal membrane, vitreomacular adhesion .   Left Eye Quality was good. Central Foveal  Thickness: 195. Progression has been stable. Findings include normal foveal contour, intraretinal fluid, no SRF, retinal drusen  (Cystic changes inferior to fovea).   Notes *Images captured and stored on drive  Diagnosis / Impression:  DME OU; OD>OS OD: stable to slightly improved  OS non-central  Clinical management:  See below  Abbreviations: NFP - Normal foveal profile. CME - cystoid macular edema. PED - pigment epithelial detachment. IRF - intraretinal fluid. SRF - subretinal fluid. EZ - ellipsoid zone. ERM - epiretinal membrane. ORA - outer retinal atrophy. ORT - outer retinal tubulation. SRHM - subretinal hyper-reflective material         Intravitreal Injection, Pharmacologic Agent - OD - Right Eye       Time Out 12/30/2017. 11:42 AM. Confirmed correct patient, procedure, site, and patient consented.   Anesthesia Topical anesthesia was used. Anesthetic medications included Lidocaine 2%, Tetracaine 0.5%.   Procedure Preparation included 5% betadine to ocular surface, eyelid speculum. A supplied needle was used.   Injection:  1.25 mg Bevacizumab 1.23m/0.05ml   NDC: 50242-060-01, Lot: [id number] , Expiration date: 01/20/2018   Route: Intravitreal, Site: Right Eye, Waste: 0 mg  Post-op Post injection exam found visual acuity of at least counting fingers. The patient tolerated the procedure well. There were no complications. The patient received written and verbal post procedure care education.                 ASSESSMENT/PLAN:    ICD-10-CM   1. Moderate nonproliferative diabetic retinopathy of both eyes with macular edema associated with type 2 diabetes mellitus (HCC) E11.3313 OCT, Retina - OU - Both Eyes    Intravitreal Injection, Pharmacologic Agent - OD - Right Eye    Bevacizumab (AVASTIN) SOLN 1.25 mg  2. Retinal edema H35.81 OCT, Retina - OU - Both Eyes  3. Vitreomacular adhesion of right eye H43.821   4. Essential hypertension I10   5. Hypertensive  retinopathy of both eyes H35.033   6. Pseudophakia of both eyes Z96.1     1,2. Moderate Non-proliferative diabetic retinopathy OU - exam shows IRH and clusters of exudates OU (OD more central involvement than OS) - initial OCT showed diabetic macular edema, both eyes (OS noncentral)  OD also with some VMA that may be contributing to DME - FA 7.16.19 shows scattered leaking microaneurysms OU, no NV OU - S/P IVA OD #1 (06.12.19), #2 (7.16.19) - DME relatively stable, but VA down slightly OD - recommend IVA #3 OD today, (08.14.19) - pt wishes to proceed - RBA of procedure discussed, questions answered - informed consent obtained and signed - see procedure note - [place] paperwork and benefits investigation started on 08.14.19 - f/u in 4 wks, DFE, OCT, possible IVA vs IVE vs steroid (pseudophakic)  3. Vitreomacular adhesion OD - no obvious traction on OCT - may be contributing to DME OD  4,5. Hypertensive retinopathy OU - discussed importance of tight BP control - monitor  6. Pseudophakia OU  - s/p CE/IOL OU  - beautiful surgeries, doing well  - monitor   Ophthalmic Meds Ordered this visit:  Meds ordered this encounter  Medications  . Bevacizumab (  AVASTIN) SOLN 1.25 mg       Return in about 4 weeks (around 01/27/2018) for F/U NPDR OU, OCT, Possible Injxn.  There are no Patient Instructions on file for this visit.   Explained the diagnoses, plan, and follow up with the patient and they expressed understanding.  Patient expressed understanding of the importance of proper follow up care.   This document serves as a record of services personally performed by Gardiner Sleeper, MD, PhD. It was created on their behalf by Ernest Mallick, OA, an ophthalmic assistant. The creation of this record is the provider's dictation and/or activities during the visit.    Electronically signed by: Ernest Mallick, OA  08.13.2019 11:59 PM    Gardiner Sleeper, M.D., Ph.D. Diseases & Surgery of the  Retina and Vitreous [facility]  I have reviewed the above documentation for accuracy and completeness, and I agree with the above. Gardiner Sleeper, M.D., Ph.D. 12/31/17 11:59 PM    Abbreviations: M myopia (nearsighted); A astigmatism; H hyperopia (farsighted); P presbyopia; Mrx spectacle prescription;  CTL contact lenses; OD right eye; OS left eye; OU both eyes  XT exotropia; ET esotropia; PEK punctate epithelial keratitis; PEE punctate epithelial erosions; DES dry eye syndrome; MGD meibomian gland dysfunction; ATs artificial tears; PFAT's preservative free artificial tears; [place] nuclear sclerotic cataract; PSC posterior subcapsular cataract; ERM epi-retinal membrane; PVD posterior vitreous detachment; RD retinal detachment; DM diabetes mellitus; DR diabetic retinopathy; NPDR non-proliferative diabetic retinopathy; PDR proliferative diabetic retinopathy; CSME clinically significant macular edema; DME diabetic macular edema; dbh dot blot hemorrhages; CWS cotton wool spot; POAG primary open angle glaucoma; C/D cup-to-disc ratio; HVF humphrey visual field; GVF goldmann visual field; OCT optical coherence tomography; IOP intraocular pressure; BRVO Branch retinal vein occlusion; CRVO central retinal vein occlusion; CRAO central retinal artery occlusion; BRAO branch retinal artery occlusion; RT retinal tear; SB scleral buckle; PPV pars plana vitrectomy; VH Vitreous hemorrhage; PRP panretinal laser photocoagulation; IVK intravitreal kenalog; VMT vitreomacular traction; MH Macular hole;  NVD neovascularization of the disc; NVE neovascularization elsewhere; AREDS age related eye disease study; ARMD age related macular degeneration; POAG primary open angle glaucoma; EBMD epithelial/anterior basement membrane dystrophy; ACIOL anterior chamber intraocular lens; IOL intraocular lens; PCIOL posterior chamber intraocular lens; Phaco/IOL phacoemulsification with intraocular lens placement; [place]  photorefractive keratectomy; LASIK laser assisted in situ keratomileusis; HTN hypertension; DM diabetes mellitus; COPD chronic obstructive pulmonary disease

## 2017-12-30 ENCOUNTER — Ambulatory Visit (INDEPENDENT_AMBULATORY_CARE_PROVIDER_SITE_OTHER): Payer: Medicare Other | Admitting: Ophthalmology

## 2017-12-30 ENCOUNTER — Encounter (INDEPENDENT_AMBULATORY_CARE_PROVIDER_SITE_OTHER): Payer: Self-pay | Admitting: Ophthalmology

## 2017-12-30 DIAGNOSIS — H3581 Retinal edema: Secondary | ICD-10-CM

## 2017-12-30 DIAGNOSIS — Z961 Presence of intraocular lens: Secondary | ICD-10-CM

## 2017-12-30 DIAGNOSIS — E113313 Type 2 diabetes mellitus with moderate nonproliferative diabetic retinopathy with macular edema, bilateral: Secondary | ICD-10-CM | POA: Diagnosis not present

## 2017-12-30 DIAGNOSIS — H35033 Hypertensive retinopathy, bilateral: Secondary | ICD-10-CM

## 2017-12-30 DIAGNOSIS — I10 Essential (primary) hypertension: Secondary | ICD-10-CM

## 2017-12-30 DIAGNOSIS — H43821 Vitreomacular adhesion, right eye: Secondary | ICD-10-CM | POA: Diagnosis not present

## 2017-12-31 ENCOUNTER — Encounter (INDEPENDENT_AMBULATORY_CARE_PROVIDER_SITE_OTHER): Payer: Self-pay | Admitting: Ophthalmology

## 2017-12-31 DIAGNOSIS — E113313 Type 2 diabetes mellitus with moderate nonproliferative diabetic retinopathy with macular edema, bilateral: Secondary | ICD-10-CM | POA: Diagnosis not present

## 2017-12-31 MED ORDER — BEVACIZUMAB CHEMO INJECTION 1.25MG/0.05ML SYRINGE FOR KALEIDOSCOPE
1.2500 mg | INTRAVITREAL | Status: DC
  Administered 2017-12-31: 1.25 mg via INTRAVITREAL

## 2018-01-26 NOTE — Progress Notes (Signed)
[facility] Note  01/27/2018     CHIEF COMPLAINT Patient presents for Retina Follow Up   HISTORY OF PRESENT ILLNESS: [name] is a 62 y.o. female who presents to the clinic today for:   HPI    Retina Follow Up    Patient presents with  Diabetic Retinopathy.  In both eyes.  Severity is moderate.  Duration of 4 weeks.  Since onset it is stable.  I, the attending physician,  performed the HPI with the patient and updated documentation appropriately.          Comments    Pt presents for NPDR OU f/u, pt states her vision has not been good since last visit, states it has been very blurry, but she is having no other problems, no FOL, floaters, pain or wavy vision, pt is using Visine gtts every night at bed time       Last edited by Bernarda Caffey, MD on 01/27/2018 12:19 PM. (History)      Referring physician: Duffy Bruce, Manya Silvas, MD [place], [place] [id number]  HISTORICAL INFORMATION:   Selected notes from the MEDICAL RECORD NUMBER Referred by Dr. Monna Fam for concern of diabetic retinopathy with edema OU LEE: 05.17.19 (K. Hecker) [BCVA: OD: 20/40 OS: 20/25-1] Ocular Hx-NPDR with edema OU, DES OU, PVD OU, Pseudophakia OU PMH-DM (taking Levemir and Metformin), HTN, High Cholesterol, Current Smoker    CURRENT MEDICATIONS: Current Outpatient Medications (Ophthalmic Drugs)  Medication Sig  . cromolyn (OPTICROM) 4 % ophthalmic solution Place 1 drop into both eyes 4 (four) times daily.   No current facility-administered medications for this visit.  (Ophthalmic Drugs)   Current Outpatient Medications (Other)  Medication Sig  . acetaminophen (TYLENOL) 325 MG tablet Take 650 mg by mouth every 4 (four) hours as needed for mild pain, moderate pain, fever or headache.  Marland Kitchen aspirin 81 MG tablet Take 81 mg by mouth daily.  Marland Kitchen atorvastatin (LIPITOR) 40 MG tablet Take 40 mg by mouth daily. For Hyperlipidemia  . cetirizine (ZYRTEC) 10  MG tablet Take 10 mg by mouth daily.  Marland Kitchen docusate sodium 100 MG CAPS Take 100 mg by mouth 2 (two) times daily.  . ferrous sulfate 325 (65 FE) MG tablet Take 325 mg by mouth daily with breakfast.  . hydrALAZINE (APRESOLINE) 25 MG tablet Take 25 mg by mouth 4 (four) times daily.  . hydrochlorothiazide (HYDRODIURIL) 25 MG tablet Take 25 mg by mouth every morning.  . insulin aspart (NOVOLOG) 100 UNIT/ML injection Inject 3 Units into the skin 3 (three) times daily before meals. Hold If BS >100  . insulin glargine (LANTUS) 100 UNIT/ML injection Inject 40 Units into the skin at bedtime.   Marland Kitchen lisinopril (PRINIVIL,ZESTRIL) 20 MG tablet Take 1 tablet (20 mg total) by mouth daily.  Marland Kitchen loperamide (IMODIUM A-D) 2 MG tablet Take 2 mg by mouth every 4 (four) hours as needed for diarrhea or loose stools.  Marland Kitchen oxyCODONE-acetaminophen (PERCOCET/ROXICET) 5-325 MG tablet Take two tablets by mouth every 4 hours as needed for pain. Do not exceed 4gm of APAP in 24 hours  . polyethylene glycol (MIRALAX / GLYCOLAX) packet Take 17 g by mouth daily as needed for moderate constipation.   Current Facility-Administered Medications (Other)  Medication Route  . Bevacizumab (AVASTIN) SOLN 1.25 mg Intravitreal  . Bevacizumab (AVASTIN) SOLN 1.25 mg Intravitreal  . Bevacizumab (AVASTIN) SOLN 1.25 mg Intravitreal  . Bevacizumab (AVASTIN) SOLN 1.25 mg Intravitreal  REVIEW OF SYSTEMS: ROS    Positive for: Endocrine, Cardiovascular, Eyes   Negative for: Constitutional, Gastrointestinal, Neurological, Skin, Genitourinary, Musculoskeletal, HENT, Respiratory, Psychiatric, Allergic/Imm, Heme/Lymph   Last edited by Debbrah Alar, COT on 01/27/2018 10:05 AM. (History)       ALLERGIES Allergies  Allergen Reactions  . Banana Swelling and Other (See Comments)    Facial swelling  . Penicillins Itching, Swelling and Rash    Face swells and break out  . Strawberry Extract Swelling and Other (See Comments)    Facial swelling     PAST MEDICAL HISTORY Past Medical History:  Diagnosis Date  . Atypical chest pain    a. non obstructive by cath in 2008 and 2010;  b. 02/2012 Myoview: non-ischemic, EF 57%  . Cellulitis    a. 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 Constipation   . CVA (cerebral infarction)    a.  Small right parietal noted incidentally 04/2007;  b. right sided embolic CVA 12/996;  c. 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 Diabetes mellitus, type II ([place])   . Diabetic retinopathy ([place])    NPDR w/edema OU  . Diaphragmatic hernia without mention of obstruction or gangrene   . Dry skin   . Esophageal reflux   . HTN (hypertension)   . 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 Hyperlipidemia   . Irritable bowel syndrome   . Morbid obesity ([place])   . Obesity, unspecified   . Pain in shoulder 06/28/2015  . Peripheral neuropathy   . Stroke [facility])    2013-'no residual'  . Syncope and collapse    a. near-syncopal episode in November 2008;  b. 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  . Tobacco abuse   . Vertigo    Past Surgical History:  Procedure Laterality Date  . AMPUTATION  12/30/2011   Procedure: AMPUTATION RAY;  Surgeon: Wylene Simmer, MD;  Location: [place];  Service: Orthopedics;  Laterality: Left;  LEFT FIRST RAY AMPUTATION  . AMPUTATION  01/13/2012   Procedure: AMPUTATION BELOW KNEE;  Surgeon: Wylene Simmer, MD;  Location: [place];  Service: Orthopedics;  Laterality: Left;  . AMPUTATION  03/04/2012   Procedure: AMPUTATION BELOW KNEE;  Surgeon: Wylene Simmer, MD;  Location: [place];  Service: Orthopedics;  Laterality: Left;  Revision of Left Below Knee Amputation  . AMPUTATION Right 11/02/2013   Procedure: AMPUTATION BELOW KNEE;  Surgeon: Newt Minion, MD;  Location: [place];  Service: Orthopedics;  Laterality: Right;  Right Below Knee Amputation  . CARDIAC CATHETERIZATION     LAD 30%, circumflex 50%, OM 75%, RI 60% with  small branch 80%, dominant RCA 60%, EF 45-50%  . CHOLECYSTECTOMY    . COLONOSCOPY    . EYE SURGERY Right    cataract  . I&D EXTREMITY  12/23/2011   Procedure: IRRIGATION AND DEBRIDEMENT EXTREMITY;  Surgeon: Augustin Schooling, MD;  Location: [place];  Service: Orthopedics;  Laterality: Left;  Left Foot  . I&D EXTREMITY  12/26/2011   Procedure: IRRIGATION AND DEBRIDEMENT EXTREMITY;  Surgeon: Wylene Simmer, MD;  Location: [place];  Service: Orthopedics;  Laterality: Left;  LEFT FOOT I&D WITH POSSIBLE WOUND VAC APPLICATION, POSSIBLE LEFT FIRST RAY AMPUTATION  . I&D EXTREMITY Right 10/29/2013   Procedure: IRRIGATION AND DEBRIDEMENT EXTREMITY;  Surgeon: Johnn Hai, MD;  Location: [place];  Service: Orthopedics;  Laterality: Right;  . Invasive Electrophysiologic Study  5/09   followed by insertion of an implantable loop recorder. S/p removal   . Multiple Toe Surgeries    . TEE WITHOUT CARDIOVERSION N/A 07/07/2012   Procedure: TRANSESOPHAGEAL ECHOCARDIOGRAM (TEE);  Surgeon: Lelon Perla, MD;  Location: [facility] ENDOSCOPY;  Service: Cardiovascular;  Laterality: N/A;    FAMILY HISTORY Family History  Problem Relation Age of Onset  . Pneumonia Mother   . Gallbladder disease Mother        cancer  . Heart failure Mother   . Diabetes Father   . Coronary artery disease Father   . Stroke Neg Hx     SOCIAL HISTORY Social History   Tobacco Use  . Smoking status: Current Every Day Smoker    Packs/day: 0.25    Years: 40.00    Pack years: 10.00    Types: Cigarettes  . Smokeless tobacco: Never Used  Substance Use Topics  . 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  . Drug use: No         OPHTHALMIC EXAM:  Base Eye Exam    Visual Acuity (Snellen - Linear)      Right Left   Dist cc 20/70 20/40 -2   Dist ph cc NI NI       Tonometry (Tonopen, 10:02 AM)      Right Left    Pressure 22 23       Pupils      Dark Light Shape React APD   Right 2 1.5 Round Minimal None   Left 2 1.5 Round Minimal None       Visual Fields (Counting fingers)      Left Right    Full Full       Extraocular Movement      Right Left    Full, Ortho Full, Ortho       Neuro/Psych    Oriented x3:  Yes   Mood/Affect:  Normal       Dilation    Both eyes:  1.0% Mydriacyl, 2.5% Phenylephrine @ 10:02 AM        Slit Lamp and Fundus Exam    Slit Lamp Exam      Right Left   Lids/Lashes Dermatochalasis - upper lid, Meibomian gland dysfunction, Scurf Dermatochalasis - upper lid, Meibomian gland dysfunction, Scurf   Conjunctiva/Sclera Nasal Pinguecula Nasal and temporal Pinguecula   Cornea Arcus, Debris in tear film Arcus, Debris in tear film   Anterior Chamber Deep and quiet Deep and quiet   Iris Round and dilated, No NVI Round and dilated, Transillumination defects at 0300, No NVI   Lens PC IOL in good position PC IOL in good position   Vitreous Vitreous syneresis Vitreous syneresis       Fundus Exam      Right Left   Disc Pink and Sharp, no NVD, peripapillary hemorrhage at 0300 Pink and Sharp, no NVD   C/D Ratio 0.3 0.3   Macula Blunted foveal reflex, persistent cystic changes, interval improvement in Superior Microaneurysms Good foveal reflex, scattered Microaneurysms, Retinal pigment epithelial mottling   Vessels Vascular attenuation, Tortuous Vascular attenuation, Tortuous   Periphery Attached, cluster of IRH at 0200 mid-zone, scattered IRH 360, scattered DBH and exudates nasal to disc, scattered DBH superiorly  Attached, scattered DBH  IMAGING AND PROCEDURES  Imaging and Procedures for @TODAY @  OCT, Retina - OU - Both Eyes       Right Eye Quality was good. Central Foveal Thickness: 377. Progression has improved. Findings include intraretinal fluid, inner retinal atrophy, abnormal foveal contour, no SRF, epiretinal membrane, vitreomacular adhesion  (Mild  interval improvement in IRF).   Left Eye Quality was good. Central Foveal Thickness: 202. Progression has been stable. Findings include normal foveal contour, intraretinal fluid, no SRF, retinal drusen  (Mild interval improvement in IRF).   Notes *Images captured and stored on drive  Diagnosis / Impression:  DME OU; OD>OS OD: interval improvement in IRF OS: mild interval improvement in IRF  Clinical management:  See below  Abbreviations: NFP - Normal foveal profile. CME - cystoid macular edema. PED - pigment epithelial detachment. IRF - intraretinal fluid. SRF - subretinal fluid. EZ - ellipsoid zone. ERM - epiretinal membrane. ORA - outer retinal atrophy. ORT - outer retinal tubulation. SRHM - subretinal hyper-reflective material         Intravitreal Injection, Pharmacologic Agent - OD - Right Eye       Time Out 01/27/2018. 11:27 AM. Confirmed correct patient, procedure, site, and patient consented.   Anesthesia Topical anesthesia was used. Anesthetic medications included Lidocaine 2%, Tetracaine 0.5%.   Procedure Preparation included 5% betadine to ocular surface, eyelid speculum. A supplied needle was used.   Injection:  1.25 mg Bevacizumab 1.25mg /0.33ml   NDC: 50242-060-01, Lot: [id number] , Expiration date: 03/10/2018   Route: Intravitreal, Site: Right Eye, Waste: 0 mL  Post-op Post injection exam found visual acuity of at least counting fingers. The patient tolerated the procedure well. There were no complications. The patient received written and verbal post procedure care education.                 ASSESSMENT/PLAN:    ICD-10-CM   1. Moderate nonproliferative diabetic retinopathy of both eyes with macular edema associated with type 2 diabetes mellitus (HCC) E11.3313 OCT, Retina - OU - Both Eyes    Intravitreal Injection, Pharmacologic Agent - OD - Right Eye    Bevacizumab (AVASTIN) SOLN 1.25 mg  2. Retinal edema H35.81 OCT, Retina - OU - Both Eyes  3.  Vitreomacular adhesion of right eye H43.821   4. Essential hypertension I10   5. Hypertensive retinopathy of both eyes H35.033   6. Pseudophakia of both eyes Z96.1     1,2. Moderate Non-proliferative diabetic retinopathy OU - exam shows IRH and clusters of exudates OU (OD more central involvement than OS) - initial OCT showed diabetic macular edema, both eyes (OS noncentral)  OD also with some VMA that may be contributing to DME - FA 7.16.19 shows scattered leaking microaneurysms OU, no NV OU - S/P IVA OD #1 (06.12.19), #2 (7.16.19), #3 (08.14.19) - DME slightly improved, but VA relatively stable OD - recommend IVA #4 OD today, (09.11.19) - pt wishes to proceed - RBA of procedure discussed, questions answered - informed consent obtained and signed - see procedure note - Eylea paperwork and benefits investigation started on 08.14.19 -- still pending on 9.11.19 - f/u in 4 wks, DFE, OCT, possible IVA vs IVE vs steroid (pseudophakic)  3. Vitreomacular adhesion OD - no obvious traction on OCT - may be contributing to DME OD  4,5. Hypertensive retinopathy OU - discussed importance of tight BP control - monitor  6. Pseudophakia OU  - s/p CE/IOL OU  - beautiful surgeries, doing well  - monitor   Ophthalmic  Meds Ordered this visit:  Meds ordered this encounter  Medications  . Bevacizumab (AVASTIN) SOLN 1.25 mg       Return in about 4 weeks (around 02/24/2018) for F/U NPDR OU DFE, OCT.  There are no Patient Instructions on file for this visit.   Explained the diagnoses, plan, and follow up with the patient and they expressed understanding.  Patient expressed understanding of the importance of proper follow up care.   This document serves as a record of services personally performed by Gardiner Sleeper, MD, PhD. It was created on their behalf by Ernest Mallick, OA, an ophthalmic assistant. The creation of this record is the provider's dictation and/or activities during the visit.     Electronically signed by: Ernest Mallick, OA  09.10.19 3:12 PM    Gardiner Sleeper, M.D., Ph.D. Diseases & Surgery of the Retina and Vitreous [facility]   I have reviewed the above documentation for accuracy and completeness, and I agree with the above. Gardiner Sleeper, M.D., Ph.D. 01/27/18 3:12 PM   Abbreviations: M myopia (nearsighted); A astigmatism; H hyperopia (farsighted); P presbyopia; Mrx spectacle prescription;  CTL contact lenses; OD right eye; OS left eye; OU both eyes  XT exotropia; ET esotropia; PEK punctate epithelial keratitis; PEE punctate epithelial erosions; DES dry eye syndrome; MGD meibomian gland dysfunction; ATs artificial tears; PFAT's preservative free artificial tears; [place] nuclear sclerotic cataract; PSC posterior subcapsular cataract; ERM epi-retinal membrane; PVD posterior vitreous detachment; RD retinal detachment; DM diabetes mellitus; DR diabetic retinopathy; NPDR non-proliferative diabetic retinopathy; PDR proliferative diabetic retinopathy; CSME clinically significant macular edema; DME diabetic macular edema; dbh dot blot hemorrhages; CWS cotton wool spot; POAG primary open angle glaucoma; C/D cup-to-disc ratio; HVF humphrey visual field; GVF goldmann visual field; OCT optical coherence tomography; IOP intraocular pressure; BRVO Branch retinal vein occlusion; CRVO central retinal vein occlusion; CRAO central retinal artery occlusion; BRAO branch retinal artery occlusion; RT retinal tear; SB scleral buckle; PPV pars plana vitrectomy; VH Vitreous hemorrhage; PRP panretinal laser photocoagulation; IVK intravitreal kenalog; VMT vitreomacular traction; MH Macular hole;  NVD neovascularization of the disc; NVE neovascularization elsewhere; AREDS age related eye disease study; ARMD age related macular degeneration; POAG primary open angle glaucoma; EBMD epithelial/anterior basement membrane dystrophy; ACIOL anterior chamber intraocular lens; IOL  intraocular lens; PCIOL posterior chamber intraocular lens; Phaco/IOL phacoemulsification with intraocular lens placement; Springer photorefractive keratectomy; LASIK laser assisted in situ keratomileusis; HTN hypertension; DM diabetes mellitus; COPD chronic obstructive pulmonary disease

## 2018-01-27 ENCOUNTER — Ambulatory Visit (INDEPENDENT_AMBULATORY_CARE_PROVIDER_SITE_OTHER): Payer: Medicare Other | Admitting: Ophthalmology

## 2018-01-27 ENCOUNTER — Encounter (INDEPENDENT_AMBULATORY_CARE_PROVIDER_SITE_OTHER): Payer: Self-pay | Admitting: Ophthalmology

## 2018-01-27 DIAGNOSIS — H35033 Hypertensive retinopathy, bilateral: Secondary | ICD-10-CM

## 2018-01-27 DIAGNOSIS — H43821 Vitreomacular adhesion, right eye: Secondary | ICD-10-CM | POA: Diagnosis not present

## 2018-01-27 DIAGNOSIS — H3581 Retinal edema: Secondary | ICD-10-CM

## 2018-01-27 DIAGNOSIS — E113313 Type 2 diabetes mellitus with moderate nonproliferative diabetic retinopathy with macular edema, bilateral: Secondary | ICD-10-CM | POA: Diagnosis not present

## 2018-01-27 DIAGNOSIS — Z961 Presence of intraocular lens: Secondary | ICD-10-CM

## 2018-01-27 DIAGNOSIS — I10 Essential (primary) hypertension: Secondary | ICD-10-CM

## 2018-01-27 MED ORDER — BEVACIZUMAB CHEMO INJECTION 1.25MG/0.05ML SYRINGE FOR KALEIDOSCOPE
1.2500 mg | INTRAVITREAL | Status: DC
  Administered 2018-01-27: 1.25 mg via INTRAVITREAL

## 2018-02-12 ENCOUNTER — Other Ambulatory Visit: Payer: Self-pay | Admitting: Vascular Surgery

## 2018-02-12 DIAGNOSIS — Z1231 Encounter for screening mammogram for malignant neoplasm of breast: Secondary | ICD-10-CM

## 2018-02-24 ENCOUNTER — Encounter (INDEPENDENT_AMBULATORY_CARE_PROVIDER_SITE_OTHER): Payer: Medicare Other | Admitting: Ophthalmology

## 2018-03-17 NOTE — Progress Notes (Signed)
[facility] Note  03/19/2018     CHIEF COMPLAINT Patient presents for Retina Follow Up   HISTORY OF PRESENT ILLNESS: [name] is a 62 y.o. female who presents to the clinic today for:   HPI    Retina Follow Up    Patient presents with  Diabetic Retinopathy.  In both eyes.  Severity is moderate.  Duration of 7 weeks.  Since onset it is stable.  I, the attending physician,  performed the HPI with the patient and updated documentation appropriately.          Comments    Pt returning for 7 week follow up. Patient states: Vision is the same. No eye pain.       Last edited by Bernarda Caffey, MD on 03/19/2018  3:59 PM. (History)      Referring physician: Duffy Bruce, Manya Silvas, MD [place], [place] [id number]  HISTORICAL INFORMATION:   Selected notes from the MEDICAL RECORD NUMBER Referred by Dr. Monna Fam for concern of diabetic retinopathy with edema OU LEE: 05.17.19 (K. Hecker) [BCVA: OD: 20/40 OS: 20/25-1] Ocular Hx-NPDR with edema OU, DES OU, PVD OU, Pseudophakia OU PMH-DM (taking Levemir and Metformin), HTN, High Cholesterol, Current Smoker    CURRENT MEDICATIONS: Current Outpatient Medications (Ophthalmic Drugs)  Medication Sig  . cromolyn (OPTICROM) 4 % ophthalmic solution Place 1 drop into both eyes 4 (four) times daily.   Current Facility-Administered Medications (Ophthalmic Drugs)  Medication Route  . aflibercept (EYLEA) SOLN 2 mg Intravitreal   Current Outpatient Medications (Other)  Medication Sig  . acetaminophen (TYLENOL) 325 MG tablet Take 650 mg by mouth every 4 (four) hours as needed for mild pain, moderate pain, fever or headache.  Marland Kitchen aspirin 81 MG tablet Take 81 mg by mouth daily.  Marland Kitchen atorvastatin (LIPITOR) 40 MG tablet Take 40 mg by mouth daily. For Hyperlipidemia  . cetirizine (ZYRTEC) 10 MG tablet Take 10 mg by mouth daily.  Marland Kitchen docusate sodium 100 MG CAPS Take 100 mg by mouth 2 (two) times daily.   . ferrous sulfate 325 (65 FE) MG tablet Take 325 mg by mouth daily with breakfast.  . hydrALAZINE (APRESOLINE) 25 MG tablet Take 25 mg by mouth 4 (four) times daily.  . hydrochlorothiazide (HYDRODIURIL) 25 MG tablet Take 25 mg by mouth every morning.  . insulin aspart (NOVOLOG) 100 UNIT/ML injection Inject 3 Units into the skin 3 (three) times daily before meals. Hold If BS >100  . insulin glargine (LANTUS) 100 UNIT/ML injection Inject 40 Units into the skin at bedtime.   Marland Kitchen lisinopril (PRINIVIL,ZESTRIL) 20 MG tablet Take 1 tablet (20 mg total) by mouth daily.  Marland Kitchen loperamide (IMODIUM A-D) 2 MG tablet Take 2 mg by mouth every 4 (four) hours as needed for diarrhea or loose stools.  Marland Kitchen oxyCODONE-acetaminophen (PERCOCET/ROXICET) 5-325 MG tablet Take two tablets by mouth every 4 hours as needed for pain. Do not exceed 4gm of APAP in 24 hours  . polyethylene glycol (MIRALAX / GLYCOLAX) packet Take 17 g by mouth daily as needed for moderate constipation.   Current Facility-Administered Medications (Other)  Medication Route  . Bevacizumab (AVASTIN) SOLN 1.25 mg Intravitreal  . Bevacizumab (AVASTIN) SOLN 1.25 mg Intravitreal  . Bevacizumab (AVASTIN) SOLN 1.25 mg Intravitreal  . Bevacizumab (AVASTIN) SOLN 1.25 mg Intravitreal      REVIEW OF SYSTEMS: ROS    Positive for: Endocrine, Cardiovascular, Eyes   Negative for: Constitutional, Gastrointestinal, Neurological, Skin, Genitourinary,  Musculoskeletal, HENT, Respiratory, Psychiatric, Allergic/Imm, Heme/Lymph   Last edited by Theodore Demark on 03/19/2018  1:41 PM. (History)       ALLERGIES Allergies  Allergen Reactions  . Banana Swelling and Other (See Comments)    Facial swelling  . Penicillins Itching, Swelling and Rash    Face swells and break out  . Strawberry Extract Swelling and Other (See Comments)    Facial swelling    PAST MEDICAL HISTORY Past Medical History:  Diagnosis Date  . Atypical chest pain    a. non obstructive by  cath in 2008 and 2010;  b. 02/2012 Myoview: non-ischemic, EF 57%  . Cellulitis    a. 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 Constipation   . CVA (cerebral infarction)    a.  Small right parietal noted incidentally 04/2007;  b. right sided embolic CVA 01/4764;  c. 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 Diabetes mellitus, type II ([place])   . Diabetic retinopathy ([place])    NPDR w/edema OU  . Diaphragmatic hernia without mention of obstruction or gangrene   . Dry skin   . Esophageal reflux   . HTN (hypertension)   . 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 Hyperlipidemia   . Irritable bowel syndrome   . Morbid obesity ([place])   . Obesity, unspecified   . Pain in shoulder 06/28/2015  . Peripheral neuropathy   . Stroke [facility])    2013-'no residual'  . Syncope and collapse    a. near-syncopal episode in November 2008;  b. 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  . Tobacco abuse   . Vertigo    Past Surgical History:  Procedure Laterality Date  . AMPUTATION  12/30/2011   Procedure: AMPUTATION RAY;  Surgeon: Wylene Simmer, MD;  Location: [place];  Service: Orthopedics;  Laterality: Left;  LEFT FIRST RAY AMPUTATION  . AMPUTATION  01/13/2012   Procedure: AMPUTATION BELOW KNEE;  Surgeon: Wylene Simmer, MD;  Location: [place];  Service: Orthopedics;  Laterality: Left;  . AMPUTATION  03/04/2012   Procedure: AMPUTATION BELOW KNEE;  Surgeon: Wylene Simmer, MD;  Location: [place];  Service: Orthopedics;  Laterality: Left;  Revision of Left Below Knee Amputation  . AMPUTATION Right 11/02/2013   Procedure: AMPUTATION BELOW KNEE;  Surgeon: Newt Minion, MD;  Location: [place];  Service: Orthopedics;  Laterality: Right;  Right Below Knee Amputation  . CARDIAC CATHETERIZATION     LAD 30%, circumflex 50%, OM 75%, RI 60% with small branch 80%, dominant RCA 60%, EF 45-50%  . CHOLECYSTECTOMY    . COLONOSCOPY    . EYE SURGERY Right    cataract   . I&D EXTREMITY  12/23/2011   Procedure: IRRIGATION AND DEBRIDEMENT EXTREMITY;  Surgeon: Augustin Schooling, MD;  Location: [place];  Service: Orthopedics;  Laterality: Left;  Left Foot  . I&D EXTREMITY  12/26/2011   Procedure: IRRIGATION AND DEBRIDEMENT EXTREMITY;  Surgeon: Wylene Simmer, MD;  Location: [place];  Service: Orthopedics;  Laterality: Left;  LEFT FOOT I&D WITH POSSIBLE WOUND VAC APPLICATION, POSSIBLE LEFT FIRST RAY AMPUTATION  . I&D EXTREMITY Right 10/29/2013   Procedure: IRRIGATION AND DEBRIDEMENT EXTREMITY;  Surgeon: Johnn Hai, MD;  Location: [place];  Service: Orthopedics;  Laterality: Right;  . Invasive Electrophysiologic Study  5/09   followed by insertion of an implantable loop recorder. S/p removal   .  Multiple Toe Surgeries    . TEE WITHOUT CARDIOVERSION N/A 07/07/2012   Procedure: TRANSESOPHAGEAL ECHOCARDIOGRAM (TEE);  Surgeon: Lelon Perla, MD;  Location: [facility];  Service: Cardiovascular;  Laterality: N/A;    FAMILY HISTORY Family History  Problem Relation Age of Onset  . Pneumonia Mother   . Gallbladder disease Mother        cancer  . Heart failure Mother   . Diabetes Father   . Coronary artery disease Father   . Stroke Neg Hx     SOCIAL HISTORY Social History   Tobacco Use  . Smoking status: Current Every Day Smoker    Packs/day: 0.25    Years: 40.00    Pack years: 10.00    Types: Cigarettes  . Smokeless tobacco: Never Used  Substance Use Topics  . 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  . Drug use: No         OPHTHALMIC EXAM:  Base Eye Exam    Visual Acuity (Snellen - Linear)      Right Left   Dist [place] 20/80 20/40   Dist ph [place] NI NI       Tonometry (Tonopen, 1:59 PM)      Right Left   Pressure 21 19       Pupils      Dark Light Shape React APD   Right 2 1.5 Round Slow None   Left 2 1.5 Round Slow None        Visual Fields      Left Right    Full Full       Extraocular Movement      Right Left    Full, Ortho Full, Ortho       Neuro/Psych    Oriented x3:  Yes   Mood/Affect:  Normal       Dilation    Both eyes:  1.0% Mydriacyl, 2.5% Phenylephrine @ 2:04 PM        Slit Lamp and Fundus Exam    Slit Lamp Exam      Right Left   Lids/Lashes Dermatochalasis - upper lid, Meibomian gland dysfunction, Scurf Dermatochalasis - upper lid, Meibomian gland dysfunction, Scurf   Conjunctiva/Sclera Nasal Pinguecula Nasal and temporal Pinguecula   Cornea Arcus, Debris in tear film Arcus, Debris in tear film   Anterior Chamber Deep and quiet Deep and quiet   Iris Round and dilated, No NVI Round and dilated, Transillumination defects at 0300, No NVI   Lens PC IOL in good position PC IOL in good position   Vitreous Vitreous syneresis Vitreous syneresis       Fundus Exam      Right Left   Disc Pink and Sharp, no NVD, peripapillary hemorrhage at 0300 Pink and Sharp, no NVD   C/D Ratio 0.3 0.3   Macula improved foveal reflex, persistent cystic changes, Superior Microaneurysms Good foveal reflex, scattered Microaneurysms, Retinal pigment epithelial mottling   Vessels Vascular attenuation, Tortuous Vascular attenuation, Tortuous   Periphery Attached, cluster of IRH at 0200 mid-zone, scattered IRH 360, scattered DBH and exudates nasal to disc, scattered DBH superiorly  Attached, scattered DBH          IMAGING AND PROCEDURES  Imaging and Procedures for @TODAY @  OCT, Retina - OU - Both Eyes       Right  Eye Quality was good. Central Foveal Thickness: 370. Progression has improved. Findings include intraretinal fluid, abnormal foveal contour, no SRF, epiretinal membrane, vitreomacular adhesion  (improvement in foveal contour and IRF).   Left Eye Quality was good. Central Foveal Thickness: 203. Progression has been stable. Findings include normal foveal contour, intraretinal fluid, no SRF, retinal  drusen  (Focal IRF IT to fovea slightly worse).   Notes *Images captured and stored on drive  Diagnosis / Impression:  DME OU; OD>OS OD: interval improvement in focal contour and IRF OS: Focal IRF IT to fovea slightly worse Clinical management:  See below  Abbreviations: NFP - Normal foveal profile. CME - cystoid macular edema. PED - pigment epithelial detachment. IRF - intraretinal fluid. SRF - subretinal fluid. EZ - ellipsoid zone. ERM - epiretinal membrane. ORA - outer retinal atrophy. ORT - outer retinal tubulation. SRHM - subretinal hyper-reflective material         Intravitreal Injection, Pharmacologic Agent - OD - Right Eye       Time Out 03/19/2018. 4:02 PM. Confirmed correct patient, procedure, site, and patient consented.   Anesthesia Topical anesthesia was used. Anesthetic medications included Lidocaine 2%, Proparacaine 0.5%.   Procedure Preparation included 5% betadine to ocular surface, eyelid speculum. A 30 gauge needle was used.   Injection:  2 mg aflibercept 2 MG/0.05ML   NDC: 61755-005-02, Lot: [id number], Expiration date: 02/16/2019   Route: Intravitreal, Site: Right Eye, Waste: 0.05 mL  Post-op Post injection exam found visual acuity of at least counting fingers. The patient tolerated the procedure well. There were no complications. The patient received written and verbal post procedure care education.                 ASSESSMENT/PLAN:    ICD-10-CM   1. Moderate nonproliferative diabetic retinopathy of both eyes with macular edema associated with type 2 diabetes mellitus (HCC) Y18.5631 Intravitreal Injection, Pharmacologic Agent - OD - Right Eye    aflibercept (EYLEA) SOLN 2 mg  2. Retinal edema H35.81 OCT, Retina - OU - Both Eyes  3. Vitreomacular adhesion of right eye H43.821   4. Essential hypertension I10   5. Hypertensive retinopathy of both eyes H35.033   6. Pseudophakia of both eyes Z96.1     1,2. Moderate Non-proliferative diabetic  retinopathy OU - exam shows IRH and clusters of exudates OU (OD more central involvement than OS) - initial OCT showed diabetic macular edema, both eyes (OS noncentral)  OD also with some VMA that may be contributing to DME - FA 7.16.19 shows scattered leaking microaneurysms OU, no NV OU - S/P IVA OD #1 (06.12.19), #2 (7.16.19), #3 (08.14.19), #4 (09.11.19) - DME slightly improved, but VA relatively stable OD - recommend IVE #1 OD today, (11.01.19) - pt wishes to proceed - RBA of procedure discussed, questions answered - informed consent obtained and signed - see procedure note - Eylea paperwork and benefits investigation started on 08.14.19 -- approved as of 11.01.19 - f/u in 4 wks, DFE, OCT, possible IVE  3. Vitreomacular adhesion OD  - no obvious traction on OCT  - may be contributing to DME OD  4,5. Hypertensive retinopathy OU  - discussed importance of tight BP control  - monitor  6. Pseudophakia OU  - s/p CE/IOL OU  - beautiful surgeries, doing well  - monitor   Ophthalmic Meds Ordered this visit:  Meds ordered this encounter  Medications  . aflibercept (EYLEA) SOLN 2 mg       Return in about  4 weeks (around 04/16/2018) for Dilated Exam, OCT.  There are no Patient Instructions on file for this visit.   Explained the diagnoses, plan, and follow up with the patient and they expressed understanding.  Patient expressed understanding of the importance of proper follow up care.   This document serves as a record of services personally performed by Gardiner Sleeper, MD, PhD. It was created on their behalf by Ernest Mallick, OA, an ophthalmic assistant. The creation of this record is the provider's dictation and/or activities during the visit.    Electronically signed by: Ernest Mallick, OA  10.30.19 11:10 PM     Gardiner Sleeper, M.D., Ph.D. Diseases & Surgery of the Retina and Vitreous [facility]   I have reviewed the above documentation for  accuracy and completeness, and I agree with the above. Gardiner Sleeper, M.D., Ph.D. 03/21/18 11:10 PM    Abbreviations: M myopia (nearsighted); A astigmatism; H hyperopia (farsighted); P presbyopia; Mrx spectacle prescription;  CTL contact lenses; OD right eye; OS left eye; OU both eyes  XT exotropia; ET esotropia; PEK punctate epithelial keratitis; PEE punctate epithelial erosions; DES dry eye syndrome; MGD meibomian gland dysfunction; ATs artificial tears; PFAT's preservative free artificial tears; Roberts nuclear sclerotic cataract; PSC posterior subcapsular cataract; ERM epi-retinal membrane; PVD posterior vitreous detachment; RD retinal detachment; DM diabetes mellitus; DR diabetic retinopathy; NPDR non-proliferative diabetic retinopathy; PDR proliferative diabetic retinopathy; CSME clinically significant macular edema; DME diabetic macular edema; dbh dot blot hemorrhages; CWS cotton wool spot; POAG primary open angle glaucoma; C/D cup-to-disc ratio; HVF humphrey visual field; GVF goldmann visual field; OCT optical coherence tomography; IOP intraocular pressure; BRVO Branch retinal vein occlusion; CRVO central retinal vein occlusion; CRAO central retinal artery occlusion; BRAO branch retinal artery occlusion; RT retinal tear; SB scleral buckle; PPV pars plana vitrectomy; VH Vitreous hemorrhage; PRP panretinal laser photocoagulation; IVK intravitreal kenalog; VMT vitreomacular traction; MH Macular hole;  NVD neovascularization of the disc; NVE neovascularization elsewhere; AREDS age related eye disease study; ARMD age related macular degeneration; POAG primary open angle glaucoma; EBMD epithelial/anterior basement membrane dystrophy; ACIOL anterior chamber intraocular lens; IOL intraocular lens; PCIOL posterior chamber intraocular lens; Phaco/IOL phacoemulsification with intraocular lens placement; [place] photorefractive keratectomy; LASIK laser assisted in situ keratomileusis; HTN hypertension; DM diabetes  mellitus; COPD chronic obstructive pulmonary disease

## 2018-03-18 ENCOUNTER — Ambulatory Visit: Payer: Self-pay

## 2018-03-19 ENCOUNTER — Ambulatory Visit (INDEPENDENT_AMBULATORY_CARE_PROVIDER_SITE_OTHER): Payer: Medicare Other | Admitting: Ophthalmology

## 2018-03-19 ENCOUNTER — Encounter (INDEPENDENT_AMBULATORY_CARE_PROVIDER_SITE_OTHER): Payer: Self-pay | Admitting: Ophthalmology

## 2018-03-19 DIAGNOSIS — E113313 Type 2 diabetes mellitus with moderate nonproliferative diabetic retinopathy with macular edema, bilateral: Secondary | ICD-10-CM | POA: Diagnosis not present

## 2018-03-19 DIAGNOSIS — Z961 Presence of intraocular lens: Secondary | ICD-10-CM

## 2018-03-19 DIAGNOSIS — H35033 Hypertensive retinopathy, bilateral: Secondary | ICD-10-CM

## 2018-03-19 DIAGNOSIS — H43821 Vitreomacular adhesion, right eye: Secondary | ICD-10-CM | POA: Diagnosis not present

## 2018-03-19 DIAGNOSIS — H3581 Retinal edema: Secondary | ICD-10-CM

## 2018-03-19 DIAGNOSIS — I10 Essential (primary) hypertension: Secondary | ICD-10-CM

## 2018-03-19 MED ORDER — AFLIBERCEPT 2MG/0.05ML IZ SOLN FOR KALEIDOSCOPE
2.0000 mg | INTRAVITREAL | Status: AC
  Administered 2018-03-19: 2 mg via INTRAVITREAL

## 2018-03-21 ENCOUNTER — Encounter (INDEPENDENT_AMBULATORY_CARE_PROVIDER_SITE_OTHER): Payer: Self-pay | Admitting: Ophthalmology

## 2018-04-19 ENCOUNTER — Encounter (INDEPENDENT_AMBULATORY_CARE_PROVIDER_SITE_OTHER): Payer: Medicare Other | Admitting: Ophthalmology

## 2018-04-30 ENCOUNTER — Telehealth (INDEPENDENT_AMBULATORY_CARE_PROVIDER_SITE_OTHER): Payer: Self-pay

## 2018-04-30 ENCOUNTER — Encounter (INDEPENDENT_AMBULATORY_CARE_PROVIDER_SITE_OTHER): Payer: Medicare Other | Admitting: Ophthalmology

## 2018-05-04 ENCOUNTER — Telehealth: Payer: Self-pay | Admitting: Internal Medicine

## 2018-05-04 NOTE — Telephone Encounter (Signed)
New hem referral received from [facility] for dx of anemia. Pt has been scheduled to see Dr. Walden Field on 12/26 at 10:50am.

## 2018-05-13 ENCOUNTER — Inpatient Hospital Stay: Payer: Medicare Other

## 2018-05-13 ENCOUNTER — Inpatient Hospital Stay: Payer: Medicare Other | Attending: Internal Medicine | Admitting: Internal Medicine

## 2018-05-13 ENCOUNTER — Telehealth: Payer: Self-pay

## 2018-05-13 VITALS — BP 119/50 | HR 83 | Temp 97.6°F | Resp 14

## 2018-05-13 DIAGNOSIS — D509 Iron deficiency anemia, unspecified: Secondary | ICD-10-CM | POA: Diagnosis present

## 2018-05-13 DIAGNOSIS — E119 Type 2 diabetes mellitus without complications: Secondary | ICD-10-CM

## 2018-05-13 DIAGNOSIS — Z8249 Family history of ischemic heart disease and other diseases of the circulatory system: Secondary | ICD-10-CM | POA: Insufficient documentation

## 2018-05-13 DIAGNOSIS — F17210 Nicotine dependence, cigarettes, uncomplicated: Secondary | ICD-10-CM | POA: Insufficient documentation

## 2018-05-13 DIAGNOSIS — Z79899 Other long term (current) drug therapy: Secondary | ICD-10-CM | POA: Insufficient documentation

## 2018-05-13 DIAGNOSIS — D649 Anemia, unspecified: Secondary | ICD-10-CM

## 2018-05-13 DIAGNOSIS — Z794 Long term (current) use of insulin: Secondary | ICD-10-CM | POA: Diagnosis not present

## 2018-05-13 DIAGNOSIS — Z7982 Long term (current) use of aspirin: Secondary | ICD-10-CM | POA: Diagnosis not present

## 2018-05-13 DIAGNOSIS — E785 Hyperlipidemia, unspecified: Secondary | ICD-10-CM | POA: Diagnosis not present

## 2018-05-13 DIAGNOSIS — D508 Other iron deficiency anemias: Secondary | ICD-10-CM

## 2018-05-13 DIAGNOSIS — I10 Essential (primary) hypertension: Secondary | ICD-10-CM | POA: Diagnosis not present

## 2018-05-13 DIAGNOSIS — R109 Unspecified abdominal pain: Secondary | ICD-10-CM | POA: Diagnosis not present

## 2018-05-13 LAB — CBC WITH DIFFERENTIAL/PLATELET
Abs Immature Granulocytes: 0.15 10*3/uL — ABNORMAL HIGH (ref 0.00–0.07)
Basophils Absolute: 0 10*3/uL (ref 0.0–0.1)
Basophils Relative: 0 %
EOS PCT: 2 %
Eosinophils Absolute: 0.3 10*3/uL (ref 0.0–0.5)
HCT: 28.8 % — ABNORMAL LOW (ref 36.0–46.0)
Hemoglobin: 8.5 g/dL — ABNORMAL LOW (ref 12.0–15.0)
Immature Granulocytes: 1 %
Lymphocytes Relative: 14 %
Lymphs Abs: 1.9 10*3/uL (ref 0.7–4.0)
MCH: 25.1 pg — ABNORMAL LOW (ref 26.0–34.0)
MCHC: 29.5 g/dL — AB (ref 30.0–36.0)
MCV: 85.2 fL (ref 80.0–100.0)
MONOS PCT: 2 %
Monocytes Absolute: 0.3 10*3/uL (ref 0.1–1.0)
Neutro Abs: 11.3 10*3/uL — ABNORMAL HIGH (ref 1.7–7.7)
Neutrophils Relative %: 81 %
Platelets: 269 10*3/uL (ref 150–400)
RBC: 3.38 MIL/uL — ABNORMAL LOW (ref 3.87–5.11)
RDW: 18 % — ABNORMAL HIGH (ref 11.5–15.5)
WBC: 14.1 10*3/uL — ABNORMAL HIGH (ref 4.0–10.5)
nRBC: 0 % (ref 0.0–0.2)

## 2018-05-13 LAB — COMPREHENSIVE METABOLIC PANEL
ALT: 9 U/L (ref 0–44)
AST: 8 U/L — ABNORMAL LOW (ref 15–41)
Albumin: 2.8 g/dL — ABNORMAL LOW (ref 3.5–5.0)
Alkaline Phosphatase: 116 U/L (ref 38–126)
Anion gap: 11 (ref 5–15)
BUN: 25 mg/dL — ABNORMAL HIGH (ref 8–23)
CALCIUM: 8.9 mg/dL (ref 8.9–10.3)
CO2: 22 mmol/L (ref 22–32)
Chloride: 109 mmol/L (ref 98–111)
Creatinine, Ser: 1.14 mg/dL — ABNORMAL HIGH (ref 0.44–1.00)
GFR calc Af Amer: 59 mL/min — ABNORMAL LOW (ref >60)
GFR calc non Af Amer: 51 mL/min — ABNORMAL LOW (ref >60)
Glucose, Bld: 126 mg/dL — ABNORMAL HIGH (ref 70–99)
Potassium: 4.2 mmol/L (ref 3.5–5.1)
Sodium: 142 mmol/L (ref 135–145)
Total Bilirubin: <0.2 mg/dL — ABNORMAL LOW (ref 0.3–1.2)
Total Protein: 6.7 g/dL (ref 6.5–8.1)

## 2018-05-13 LAB — FOLATE: Folate: 6.4 ng/mL (ref >5.9)

## 2018-05-13 LAB — LACTATE DEHYDROGENASE: LDH: 109 U/L (ref 98–192)

## 2018-05-13 LAB — VITAMIN B12: Vitamin B-12: 985 pg/mL — ABNORMAL HIGH (ref 180–914)

## 2018-05-13 LAB — FERRITIN: Ferritin: 121 ng/mL (ref 11–307)

## 2018-05-13 NOTE — Telephone Encounter (Signed)
Printed avs and calender of upcoming appointment. Per 12/26 ([name] requested patient return on 1/9) added by Melissa X

## 2018-05-13 NOTE — Progress Notes (Signed)
Dr. Sallye Lat,   [place] [facility].   Diagnosis Other iron deficiency anemia - Plan: CBC with Differential/Platelet, Comprehensive metabolic panel, Lactate dehydrogenase, Protein electrophoresis, serum, Ferritin, Vitamin B12, Methylmalonic acid, serum, Folate, Hemoglobinopathy evaluation, Haptoglobin  Staging Cancer Staging No matching staging information was found for the patient.  Assessment and Plan:  1.  Normocytic anemia.  62 year old female referred for evaluation of anemia.  Pt had labs done 11/09/2017 that showed WBC 12.8, HB 9.1 plts 303,000.  Chemistries WNL with K+ 4.4 Cr 1.17, Sed rate 66. Labs done 03/17/2018 showed WBC 12.7 HB 8.9 and plts 302,000. Pt denies transfusion history. She denies any blood in stool or urine.  She reports some mild left LQ abdominal pain.  Pt had Leg amputations in 2013 and 2015.  She had colonoscopy in 06/02/2007 with pathology returning as tubular adenoma and hyperplastic malignancy with no evidence of dysplasia or malignancy. She denies any family history of blood disorders.  She denies any breast surgery.  Pt is seen today for consultation due to anemia.   Labs done today 05/13/2018 reviewed and showed WBC 14.1 HB 8.5 plts 269,000.  Chemistries WNL with K+ 4.2 Cr 1.14 and normal LFTs.  Awaiting Ferritin, haptoglobin, SPEP, B12, Folate, MMA and Hb electrophoresis.  Pt will RTC in 2 weeks to go over results.  Pt is advised to notify the office if any problems prior to next visit.   2  Abdominal pain.  Pt has normal LFTs.  Pt had abdominal USN in 2004 that showed fatty liver.  Will consider imaging if symptoms fail to improve on RTC.    3.  Visibly absent left breast.  Pt denies breast surgery.  Review of chart shows no path for review.  Will discuss with PCP if pt had prior surgery.    4. HTN.  BP is 119/50.  Follow-up with PCP.    5.  Health maintenance.  Pt had colonoscopy in 2009.  Follow-up with GI as recommended.   40 minutes  spent with more than 50% spent in review of records, counseling and coordination of care.    HPI:  62 year old female referred for evaluation of anemia.  Pt had labs done 11/09/2017 that showed WBC 12.8, HB 9.1 plts 303,000.  Chemistries WNL with K+ 4.4 Cr 1.17, Sed rate 66. Labs done 03/17/2018 showed WBC 12.7 HB 8.9 and plts 302,000. Pt denies transfusion history. She denies any blood in stool or urine.  She reports some mild left LQ abdominal pain.  Pt had Leg amputations in 2013 and 2015.  She had colonoscopy in 06/02/2007 with pathology returning as tubular adenoma and hyperplastic malignancy with no evidence of dysplasia or malignancy. She denies any family history of blood disorders.  She denies any breast surgery.  Pt is seen today for consultation due to anemia.   Problem List Patient Active Problem List   Diagnosis Date Noted  . Phantom limb pain ([place]) [G54.6] 11/09/2017  . Other abnormalities of gait and mobility [R26.89] 11/09/2017  . S/P BKA (below knee amputation) bilateral ([place]) [Y10.175, Z89.511] 08/20/2015  . Dental caries [K02.9] 08/20/2015  . Constipation [K59.00] 08/15/2015  . Depression [F32.9] 08/15/2015  . Pain in shoulder [M25.519] 06/28/2015  . Hemiparesis ([place]) [G81.90] 01/10/2013  . Tobacco abuse [Z72.0]   . Paresthesia of right arm and leg [R20.2] 12/17/2011  . Facial droop [R29.810] 12/15/2011  . TIA (transient ischemic attack) [G45.9] 12/15/2011  . Syncope [R55] 07/02/2011  .  HTN (hypertension) [I10] 07/02/2011  . UTI (lower urinary tract infection) [N39.0] 07/02/2011  . Normocytic anemia [D64.9] 07/02/2011  . 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 CAD, NATIVE VESSEL [I25.10] 04/02/2009  . HIATAL HERNIA [K44.9] 04/02/2009  . Irritable bowel syndrome [K58.9] 04/02/2009  . Hyperlipidemia [E78.5] 05/30/2008  . OBESITY [E66.9] 05/30/2008  . LEFT VENTRICULAR FUNCTION, DECREASED [I25.10] 05/30/2008  . CVA (cerebral infarction) [I63.9]  05/30/2008  . GERD [K21.9] 05/30/2008    Past Medical History Past Medical History:  Diagnosis Date  . Atypical chest pain    a. non obstructive by cath in 2008 and 2010;  b. 02/2012 Myoview: non-ischemic, EF 57%  . Cellulitis    a. 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 Constipation   . CVA (cerebral infarction)    a.  Small right parietal noted incidentally 04/2007;  b. right sided embolic CVA 05/7791;  c. 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 Diabetes mellitus, type II ([place])   . Diabetic retinopathy ([place])    NPDR w/edema OU  . Diaphragmatic hernia without mention of obstruction or gangrene   . Dry skin   . Esophageal reflux   . HTN (hypertension)   . 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 Hyperlipidemia   . Irritable bowel syndrome   . Morbid obesity ([place])   . Obesity, unspecified   . Pain in shoulder 06/28/2015  . Peripheral neuropathy   . Stroke [facility])    2013-'no residual'  . Syncope and collapse    a. near-syncopal episode in November 2008;  b. 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  . Tobacco abuse   . Vertigo     Past Surgical History Past Surgical History:  Procedure Laterality Date  . AMPUTATION  12/30/2011   Procedure: AMPUTATION RAY;  Surgeon: Wylene Simmer, MD;  Location: [place];  Service: Orthopedics;  Laterality: Left;  LEFT FIRST RAY AMPUTATION  . AMPUTATION  01/13/2012   Procedure: AMPUTATION BELOW KNEE;  Surgeon: Wylene Simmer, MD;  Location: [place];  Service: Orthopedics;  Laterality: Left;  . AMPUTATION  03/04/2012   Procedure: AMPUTATION BELOW KNEE;  Surgeon: Wylene Simmer, MD;  Location: [place];  Service: Orthopedics;  Laterality: Left;  Revision of Left Below Knee Amputation  . AMPUTATION Right 11/02/2013   Procedure: AMPUTATION BELOW KNEE;  Surgeon: Newt Minion, MD;  Location: [place];  Service: Orthopedics;  Laterality: Right;  Right Below Knee Amputation  . CARDIAC  CATHETERIZATION     LAD 30%, circumflex 50%, OM 75%, RI 60% with small branch 80%, dominant RCA 60%, EF 45-50%  . CHOLECYSTECTOMY    . COLONOSCOPY    . EYE SURGERY Right    cataract  . I&D EXTREMITY  12/23/2011   Procedure: IRRIGATION AND DEBRIDEMENT EXTREMITY;  Surgeon: Augustin Schooling, MD;  Location: [place];  Service: Orthopedics;  Laterality: Left;  Left Foot  . I&D EXTREMITY  12/26/2011   Procedure: IRRIGATION AND DEBRIDEMENT EXTREMITY;  Surgeon: Wylene Simmer, MD;  Location: [place];  Service: Orthopedics;  Laterality: Left;  LEFT FOOT I&D WITH POSSIBLE WOUND VAC APPLICATION, POSSIBLE LEFT FIRST RAY AMPUTATION  . I&D EXTREMITY Right 10/29/2013   Procedure: IRRIGATION AND DEBRIDEMENT EXTREMITY;  Surgeon: Johnn Hai, MD;  Location: [place];  Service: Orthopedics;  Laterality: Right;  . Invasive Electrophysiologic Study  5/09   followed  by insertion of an implantable loop recorder. S/p removal   . Multiple Toe Surgeries    . TEE WITHOUT CARDIOVERSION N/A 07/07/2012   Procedure: TRANSESOPHAGEAL ECHOCARDIOGRAM (TEE);  Surgeon: Lelon Perla, MD;  Location: [facility] ENDOSCOPY;  Service: Cardiovascular;  Laterality: N/A;    Family History Family History  Problem Relation Age of Onset  . Pneumonia Mother   . Gallbladder disease Mother        cancer  . Heart failure Mother   . Diabetes Father   . Coronary artery disease Father   . Stroke Neg Hx      Social History  reports that she has been smoking cigarettes. She has a 10.00 pack-year smoking history. She has never used smokeless tobacco. She reports current alcohol use of about 4.0 standard drinks of alcohol per week. She reports that she does not use drugs.  Medications  Current Outpatient Medications:  .  acetaminophen (TYLENOL) 325 MG tablet, Take 650 mg by mouth every 4 (four) hours as needed for mild pain, moderate pain, fever or headache., Disp: , Rfl:  .  aspirin 81 MG tablet, Take 81 mg by mouth daily., Disp: , Rfl:  .  atorvastatin  (LIPITOR) 40 MG tablet, Take 40 mg by mouth daily. For Hyperlipidemia, Disp: , Rfl:  .  cetirizine (ZYRTEC) 10 MG tablet, Take 10 mg by mouth daily., Disp: , Rfl:  .  cromolyn (OPTICROM) 4 % ophthalmic solution, Place 1 drop into both eyes 4 (four) times daily., Disp: , Rfl:  .  docusate sodium 100 MG CAPS, Take 100 mg by mouth 2 (two) times daily., Disp: 10 capsule, Rfl: 0 .  ferrous sulfate 325 (65 FE) MG tablet, Take 325 mg by mouth daily with breakfast., Disp: , Rfl:  .  hydrALAZINE (APRESOLINE) 25 MG tablet, Take 25 mg by mouth 4 (four) times daily., Disp: , Rfl:  .  hydrochlorothiazide (HYDRODIURIL) 25 MG tablet, Take 25 mg by mouth every morning., Disp: , Rfl:  .  insulin aspart (NOVOLOG) 100 UNIT/ML injection, Inject 3 Units into the skin 3 (three) times daily before meals. Hold If BS >100, Disp: , Rfl:  .  insulin glargine (LANTUS) 100 UNIT/ML injection, Inject 40 Units into the skin at bedtime. , Disp: , Rfl:  .  lisinopril (PRINIVIL,ZESTRIL) 20 MG tablet, Take 1 tablet (20 mg total) by mouth daily., Disp: 30 tablet, Rfl: 1 .  loperamide (IMODIUM A-D) 2 MG tablet, Take 2 mg by mouth every 4 (four) hours as needed for diarrhea or loose stools., Disp: , Rfl:  .  oxyCODONE-acetaminophen (PERCOCET/ROXICET) 5-325 MG tablet, Take two tablets by mouth every 4 hours as needed for pain. Do not exceed 4gm of APAP in 24 hours, Disp: 360 tablet, Rfl: 0 .  polyethylene glycol (MIRALAX / GLYCOLAX) packet, Take 17 g by mouth daily as needed for moderate constipation., Disp: 14 each, Rfl: 0  Current Facility-Administered Medications:  .  aflibercept (EYLEA) SOLN 2 mg, 2 mg, Intravitreal, , Bernarda Caffey, MD, 2 mg at 03/19/18 1621 .  Bevacizumab (AVASTIN) SOLN 1.25 mg, 1.25 mg, Intravitreal, , Bernarda Caffey, MD, 1.25 mg at 11/01/17 2300 .  Bevacizumab (AVASTIN) SOLN 1.25 mg, 1.25 mg, Intravitreal, , Bernarda Caffey, MD, 1.25 mg at 12/01/17 1437 .  Bevacizumab (AVASTIN) SOLN 1.25 mg, 1.25 mg, Intravitreal, ,  Bernarda Caffey, MD, 1.25 mg at 12/31/17 2354 .  Bevacizumab (AVASTIN) SOLN 1.25 mg, 1.25 mg, Intravitreal, , Bernarda Caffey, MD, 1.25 mg at 01/27/18 1508  Allergies  Banana; Penicillins; and Strawberry extract  Review of Systems Review of Systems - Oncology ROS negative   Physical Exam  Vitals Wt Readings from Last 3 Encounters:  10/24/15 256 lb (116.1 kg)  09/26/15 257 lb (116.6 kg)  08/20/15 253 lb (114.8 kg)   Temp Readings from Last 3 Encounters:  05/13/18 97.6 F (36.4 C) (Oral)  10/24/15 98 F (36.7 C) (Oral)  09/26/15 98.6 F (37 C) (Oral)   BP Readings from Last 3 Encounters:  05/13/18 (!) 119/50  10/24/15 (!) 174/74  09/26/15 120/62   Pulse Readings from Last 3 Encounters:  05/13/18 83  10/24/15 78  09/26/15 64   Constitutional: Well-developed, well-nourished, and in no distress.  Seated in wheelchair.   HENT: Head: Normocephalic and atraumatic.  Mouth/Throat: No oropharyngeal exudate. Mucosa moist. Eyes: Pupils are equal, round, and reactive to light. Conjunctivae are normal. No scleral icterus.  Neck: Normal range of motion. Neck supple. No JVD present.  Cardiovascular: Normal rate, regular rhythm and normal heart sounds.  Exam reveals no gallop and no friction rub.   No murmur heard. Pulmonary/Chest: Effort normal and breath sounds normal. No respiratory distress. No wheezes.No rales.  Abdominal: Soft. Bowel sounds are normal. Obese, Tender in left lower quadrant.  No guarding.   Musculoskeletal: Bilateral LE amputation.   Lymphadenopathy: No cervical,axillary or supraclavicular adenopathy.  Neurological: Alert and oriented to person, place, and time. No cranial nerve deficit.  Skin: Skin is warm and dry. No rash noted. No erythema. No pallor.  Psychiatric: Affect and judgment normal.  Chaperone present.  Visibly absent left breast.  Formal breast exam deferred today.     Labs No visits with results within 3 Day(s) from this visit.  Latest known visit  with results is:  Abstract on 09/18/2015  Component Date Value Ref Range Status  . Hemoglobin 09/10/2015 9.6* 12.0 - 16.0 g/dL Final  . HCT 09/10/2015 32* 36 - 46 % Final  . Platelets 09/10/2015 300  150 - 399 K/L Final  . WBC 09/10/2015 11.2  10^3/mL Final  . Hemoglobin 08/20/2015 8.7* 12.0 - 16.0 g/dL Final  . HCT 08/20/2015 30* 36 - 46 % Final  . Platelets 08/20/2015 290  150 - 399 K/L Final  . WBC 08/20/2015 10.3  10^3/mL Final  . Glucose 08/20/2015 117  mg/dL Final  . BUN 08/20/2015 28* 4 - 21 mg/dL Final  . Creatinine 08/20/2015 1.2* 0.5 - 1.1 mg/dL Final  . Potassium 08/20/2015 4.3  3.4 - 5.3 mmol/L Final  . Sodium 08/20/2015 143  137 - 147 mmol/L Final     Pathology Orders Placed This Encounter  Procedures  . CBC with Differential/Platelet    Standing Status:   Future    Number of Occurrences:   1    Standing Expiration Date:   05/14/2019  . Comprehensive metabolic panel    Standing Status:   Future    Number of Occurrences:   1    Standing Expiration Date:   05/14/2019  . Lactate dehydrogenase    Standing Status:   Future    Number of Occurrences:   1    Standing Expiration Date:   05/14/2019  . Protein electrophoresis, serum    Standing Status:   Future    Number of Occurrences:   1    Standing Expiration Date:   05/14/2019  . Ferritin    Standing Status:   Future    Number of Occurrences:   1    Standing Expiration  Date:   05/14/2019  . Vitamin B12    Standing Status:   Future    Number of Occurrences:   1    Standing Expiration Date:   05/14/2019  . Methylmalonic acid, serum    Standing Status:   Future    Number of Occurrences:   1    Standing Expiration Date:   05/14/2019  . Folate    Standing Status:   Future    Number of Occurrences:   1    Standing Expiration Date:   05/14/2019  . Hemoglobinopathy evaluation    Standing Status:   Future    Number of Occurrences:   1    Standing Expiration Date:   05/14/2019  . Haptoglobin    Standing  Status:   Future    Number of Occurrences:   1    Standing Expiration Date:   05/14/2019       Zoila Shutter MD

## 2018-05-14 LAB — PROTEIN ELECTROPHORESIS, SERUM
A/G Ratio: 0.9 (ref 0.7–1.7)
Albumin ELP: 2.8 g/dL — ABNORMAL LOW (ref 2.9–4.4)
Alpha-1-Globulin: 0.2 g/dL (ref 0.0–0.4)
Alpha-2-Globulin: 0.9 g/dL (ref 0.4–1.0)
Beta Globulin: 0.9 g/dL (ref 0.7–1.3)
Gamma Globulin: 1 g/dL (ref 0.4–1.8)
Globulin, Total: 3 g/dL (ref 2.2–3.9)
Total Protein ELP: 5.8 g/dL — ABNORMAL LOW (ref 6.0–8.5)

## 2018-05-14 LAB — HEMOGLOBINOPATHY EVALUATION
HGB S QUANTITAION: 0 %
Hgb A2 Quant: 2 % (ref 1.8–3.2)
Hgb A: 98 % (ref 96.4–98.8)
Hgb C: 0 %
Hgb F Quant: 0 % (ref 0.0–2.0)
Hgb Variant: 0 %

## 2018-05-14 LAB — HAPTOGLOBIN: Haptoglobin: 344 mg/dL (ref 37–355)

## 2018-05-17 LAB — METHYLMALONIC ACID, SERUM: METHYLMALONIC ACID, QUANTITATIVE: 197 nmol/L (ref 0–378)

## 2018-05-27 ENCOUNTER — Other Ambulatory Visit: Payer: Self-pay

## 2018-05-27 ENCOUNTER — Encounter: Payer: Self-pay | Admitting: Internal Medicine

## 2018-05-27 ENCOUNTER — Inpatient Hospital Stay: Payer: Medicare Other | Attending: Internal Medicine | Admitting: Internal Medicine

## 2018-05-27 ENCOUNTER — Telehealth: Payer: Self-pay

## 2018-05-27 VITALS — BP 153/68 | HR 74 | Temp 97.7°F | Resp 17

## 2018-05-27 DIAGNOSIS — Z79899 Other long term (current) drug therapy: Secondary | ICD-10-CM | POA: Insufficient documentation

## 2018-05-27 DIAGNOSIS — R634 Abnormal weight loss: Secondary | ICD-10-CM | POA: Insufficient documentation

## 2018-05-27 DIAGNOSIS — E785 Hyperlipidemia, unspecified: Secondary | ICD-10-CM | POA: Insufficient documentation

## 2018-05-27 DIAGNOSIS — F17210 Nicotine dependence, cigarettes, uncomplicated: Secondary | ICD-10-CM | POA: Diagnosis not present

## 2018-05-27 DIAGNOSIS — E119 Type 2 diabetes mellitus without complications: Secondary | ICD-10-CM | POA: Insufficient documentation

## 2018-05-27 DIAGNOSIS — Z8249 Family history of ischemic heart disease and other diseases of the circulatory system: Secondary | ICD-10-CM | POA: Insufficient documentation

## 2018-05-27 DIAGNOSIS — R0602 Shortness of breath: Secondary | ICD-10-CM | POA: Diagnosis not present

## 2018-05-27 DIAGNOSIS — R109 Unspecified abdominal pain: Secondary | ICD-10-CM | POA: Diagnosis not present

## 2018-05-27 DIAGNOSIS — Z7982 Long term (current) use of aspirin: Secondary | ICD-10-CM | POA: Insufficient documentation

## 2018-05-27 DIAGNOSIS — D649 Anemia, unspecified: Secondary | ICD-10-CM | POA: Diagnosis present

## 2018-05-27 DIAGNOSIS — I10 Essential (primary) hypertension: Secondary | ICD-10-CM | POA: Insufficient documentation

## 2018-05-27 DIAGNOSIS — R1084 Generalized abdominal pain: Secondary | ICD-10-CM

## 2018-05-27 DIAGNOSIS — Z794 Long term (current) use of insulin: Secondary | ICD-10-CM | POA: Diagnosis not present

## 2018-05-27 DIAGNOSIS — Z8673 Personal history of transient ischemic attack (TIA), and cerebral infarction without residual deficits: Secondary | ICD-10-CM | POA: Diagnosis not present

## 2018-05-27 DIAGNOSIS — Z720 Tobacco use: Secondary | ICD-10-CM

## 2018-05-27 NOTE — Progress Notes (Signed)
Diagnosis Generalized abdominal pain - Plan: CBC with Differential/Platelet, Comprehensive metabolic panel, Lactate dehydrogenase, Ferritin, Haptoglobin  Shortness of breath - Plan: CT CHEST W CONTRAST, CBC with Differential/Platelet, Comprehensive metabolic panel, Lactate dehydrogenase, Ferritin, Haptoglobin  Tobacco use - Plan: CT CHEST W CONTRAST, CBC with Differential/Platelet, Comprehensive metabolic panel, Lactate dehydrogenase, Ferritin, Haptoglobin  Abnormal weight loss - Plan: CT ABDOMEN PELVIS W CONTRAST, CBC with Differential/Platelet, Comprehensive metabolic panel, Lactate dehydrogenase, Ferritin, Haptoglobin  Normocytic anemia - Plan: CBC with Differential/Platelet, Comprehensive metabolic panel, Lactate dehydrogenase, Ferritin, Haptoglobin  Staging Cancer Staging No matching staging information was found for the patient.  Assessment and Plan:    1.  Normocytic anemia.  63 year old female referred for evaluation of anemia.  Labs done 11/09/2017 showed WBC 12.8, HB 9.1 plts 303,000.  Chemistries WNL with K+ 4.4 Cr 1.17, Sed rate 66. Labs done 03/17/2018 showed WBC 12.7 HB 8.9 and plts 302,000. Pt denies transfusion history. She denies any blood in stool or urine.  She reports some mild left LQ abdominal pain.  Pt had Leg amputations in 2013 and 2015.  She had colonoscopy in 06/02/2007 with pathology returning as tubular adenoma and with no evidence of dysplasia or malignancy. She denies any family history of blood disorders.  She denies any breast surgery.    Labs done 05/13/2018 reviewed and showed WBC 14.1 HB 8.5 plts 269,000.  Chemistries WNL with K+ 4.2 Cr 1.14 and normal LFTs.  Ferritin 121.  SPEP negative,normal B12, Folate, MMA and Hb electrophoresis.  Due to ongoing complaints of abdominal pain, she is set up for CT CAP.  Thus far work-up for anemia has been unrevealing.  Pt will follow-up to go over results.  She will have repeat labs at that time for ongoing monitoring.    2   Abdominal pain.  Pt has normal LFTs.  Pt had abdominal USN in 2004 that showed fatty liver.  She continues to report abdominal pain.  She is set up for CT CAP for further evaluation especially due to persistent anemia.    3.  SOB.  Pt denies breast surgery.  Review of chart shows no path for review.  Pt set up for CT chest for further evaluation.    4. HTN.  BP is 153/68.  Follow-up with PCP.    5.  Health maintenance.  Pt had colonoscopy in 2009.  Follow-up with GI as recommended.   25 minutes spent with more than 50% spent in review of records, counseling and coordination of care.    Interval History:  Historical data obtained from note dated 05/13/2018.  63 year old female referred for evaluation of anemia.  Pt had labs done 11/09/2017 that showed WBC 12.8, HB 9.1 plts 303,000.  Chemistries WNL with K+ 4.4 Cr 1.17, Sed rate 66. Labs done 03/17/2018 showed WBC 12.7 HB 8.9 and plts 302,000. Pt denies transfusion history. She denies any blood in stool or urine.  She reports some mild left LQ abdominal pain.  Pt had Leg amputations in 2013 and 2015.  She had colonoscopy in 06/02/2007 with pathology returning as tubular adenoma and hyperplastic malignancy with no evidence of dysplasia or malignancy. She denies any family history of blood disorders.  She denies any breast surgery.   Current Status:  Pt is seen today for follow-up.  She is here to go over labs.    Problem List Patient Active Problem List   Diagnosis Date Noted  . Phantom limb pain ([place]) [G54.6] 11/09/2017  .  Other abnormalities of gait and mobility [R26.89] 11/09/2017  . S/P BKA (below knee amputation) bilateral ([place]) [P71.062, Z89.511] 08/20/2015  . Dental caries [K02.9] 08/20/2015  . Constipation [K59.00] 08/15/2015  . Depression [F32.9] 08/15/2015  . Pain in shoulder [M25.519] 06/28/2015  . Hemiparesis ([place]) [G81.90] 01/10/2013  . Tobacco abuse [Z72.0]   . Paresthesia of right arm and leg [R20.2] 12/17/2011  . Facial droop  [R29.810] 12/15/2011  . TIA (transient ischemic attack) [G45.9] 12/15/2011  . Syncope [R55] 07/02/2011  . HTN (hypertension) [I10] 07/02/2011  . UTI (lower urinary tract infection) [N39.0] 07/02/2011  . Normocytic anemia [D64.9] 07/02/2011  . 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 CAD, NATIVE VESSEL [I25.10] 04/02/2009  . HIATAL HERNIA [K44.9] 04/02/2009  . Irritable bowel syndrome [K58.9] 04/02/2009  . Hyperlipidemia [E78.5] 05/30/2008  . OBESITY [E66.9] 05/30/2008  . LEFT VENTRICULAR FUNCTION, DECREASED [I25.10] 05/30/2008  . CVA (cerebral infarction) [I63.9] 05/30/2008  . GERD [K21.9] 05/30/2008    Past Medical History Past Medical History:  Diagnosis Date  . Atypical chest pain    a. non obstructive by cath in 2008 and 2010;  b. 02/2012 Myoview: non-ischemic, EF 57%  . Cellulitis    a. 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 Constipation   . CVA (cerebral infarction)    a.  Small right parietal noted incidentally 04/2007;  b. right sided embolic CVA 10/9483;  c. 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 Diabetes mellitus, type II ([place])   . Diabetic retinopathy ([place])    NPDR w/edema OU  . Diaphragmatic hernia without mention of obstruction or gangrene   . Dry skin   . Esophageal reflux   . HTN (hypertension)   . 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 Hyperlipidemia   . Irritable bowel syndrome   . Morbid obesity ([place])   . Obesity, unspecified   . Pain in shoulder 06/28/2015  . Peripheral neuropathy   . Stroke [facility])    2013-'no residual'  . Syncope and collapse    a. near-syncopal episode in November 2008;  b. 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  . Tobacco abuse   . Vertigo     Past Surgical History Past Surgical History:  Procedure Laterality Date  . AMPUTATION  12/30/2011   Procedure: AMPUTATION RAY;  Surgeon: Wylene Simmer, MD;  Location: [facility];   Service: Orthopedics;  Laterality: Left;  LEFT FIRST RAY AMPUTATION  . AMPUTATION  01/13/2012   Procedure: AMPUTATION BELOW KNEE;  Surgeon: Wylene Simmer, MD;  Location: [facility];  Service: Orthopedics;  Laterality: Left;  . AMPUTATION  03/04/2012   Procedure: AMPUTATION BELOW KNEE;  Surgeon: Wylene Simmer, MD;  Location: [facility];  Service: Orthopedics;  Laterality: Left;  Revision of Left Below Knee Amputation  . AMPUTATION Right 11/02/2013   Procedure: AMPUTATION BELOW KNEE;  Surgeon: Newt Minion, MD;  Location: [facility];  Service: Orthopedics;  Laterality: Right;  Right Below Knee Amputation  . CARDIAC CATHETERIZATION     LAD 30%, circumflex 50%, OM 75%, RI 60% with small branch 80%, dominant RCA 60%, EF 45-50%  . CHOLECYSTECTOMY    . COLONOSCOPY    . EYE SURGERY Right    cataract  . I&D EXTREMITY  12/23/2011   Procedure: IRRIGATION AND DEBRIDEMENT EXTREMITY;  Surgeon: Augustin Schooling, MD;  Location: [place];  Service: Orthopedics;  Laterality: Left;  Left Foot  . I&D EXTREMITY  12/26/2011   Procedure: IRRIGATION AND DEBRIDEMENT EXTREMITY;  Surgeon: Wylene Simmer, MD;  Location: [facility];  Service: Orthopedics;  Laterality: Left;  LEFT FOOT I&D WITH POSSIBLE WOUND VAC APPLICATION, POSSIBLE LEFT FIRST RAY AMPUTATION  . I&D EXTREMITY Right 10/29/2013   Procedure: IRRIGATION AND DEBRIDEMENT EXTREMITY;  Surgeon: Johnn Hai, MD;  Location: [facility];  Service: Orthopedics;  Laterality: Right;  . Invasive Electrophysiologic Study  5/09   followed by insertion of an implantable loop recorder. S/p removal   . Multiple Toe Surgeries    . TEE WITHOUT CARDIOVERSION N/A 07/07/2012   Procedure: TRANSESOPHAGEAL ECHOCARDIOGRAM (TEE);  Surgeon: Lelon Perla, MD;  Location: [facility] ENDOSCOPY;  Service: Cardiovascular;  Laterality: N/A;    Family History Family History  Problem Relation Age of Onset  . Pneumonia Mother   . Gallbladder disease Mother        cancer  . Heart failure Mother   . Diabetes Father   . Coronary  artery disease Father   . Stroke Neg Hx      Social History  reports that she has been smoking cigarettes. She has a 10.00 pack-year smoking history. She has never used smokeless tobacco. She reports current alcohol use of about 4.0 standard drinks of alcohol per week. She reports that she does not use drugs.  Medications  Current Outpatient Medications:  .  acetaminophen (TYLENOL) 325 MG tablet, Take 650 mg by mouth every 4 (four) hours as needed for mild pain, moderate pain, fever or headache., Disp: , Rfl:  .  aspirin 81 MG tablet, Take 81 mg by mouth daily., Disp: , Rfl:  .  atorvastatin (LIPITOR) 40 MG tablet, Take 40 mg by mouth daily. For Hyperlipidemia, Disp: , Rfl:  .  cetirizine (ZYRTEC) 10 MG tablet, Take 10 mg by mouth daily., Disp: , Rfl:  .  cromolyn (OPTICROM) 4 % ophthalmic solution, Place 1 drop into both eyes 4 (four) times daily., Disp: , Rfl:  .  docusate sodium 100 MG CAPS, Take 100 mg by mouth 2 (two) times daily., Disp: 10 capsule, Rfl: 0 .  ferrous sulfate 325 (65 FE) MG tablet, Take 325 mg by mouth daily with breakfast., Disp: , Rfl:  .  hydrALAZINE (APRESOLINE) 25 MG tablet, Take 25 mg by mouth 4 (four) times daily., Disp: , Rfl:  .  hydrochlorothiazide (HYDRODIURIL) 25 MG tablet, Take 25 mg by mouth every morning., Disp: , Rfl:  .  insulin aspart (NOVOLOG) 100 UNIT/ML injection, Inject 3 Units into the skin 3 (three) times daily before meals. Hold If BS >100, Disp: , Rfl:  .  insulin glargine (LANTUS) 100 UNIT/ML injection, Inject 40 Units into the skin at bedtime. , Disp: , Rfl:  .  lisinopril (PRINIVIL,ZESTRIL) 20 MG tablet, Take 1 tablet (20 mg total) by mouth daily., Disp: 30 tablet, Rfl: 1 .  loperamide (IMODIUM A-D) 2 MG tablet, Take 2 mg by mouth every 4 (four) hours as needed for diarrhea or loose stools., Disp: , Rfl:  .  oxyCODONE-acetaminophen (PERCOCET/ROXICET) 5-325 MG tablet, Take two tablets by mouth every 4 hours as needed for pain. Do not exceed 4gm  of APAP in 24 hours, Disp: 360 tablet, Rfl: 0 .  polyethylene glycol (MIRALAX / GLYCOLAX) packet, Take 17 g by mouth daily as needed for moderate constipation., Disp: 14 each, Rfl: 0  Current Facility-Administered Medications:  .  aflibercept (EYLEA) SOLN 2 mg, 2 mg, Intravitreal, ,  Bernarda Caffey, MD, 2 mg at 03/19/18 1621 .  Bevacizumab (AVASTIN) SOLN 1.25 mg, 1.25 mg, Intravitreal, , Bernarda Caffey, MD, 1.25 mg at 11/01/17 2300 .  Bevacizumab (AVASTIN) SOLN 1.25 mg, 1.25 mg, Intravitreal, , Bernarda Caffey, MD, 1.25 mg at 12/01/17 1437 .  Bevacizumab (AVASTIN) SOLN 1.25 mg, 1.25 mg, Intravitreal, , Bernarda Caffey, MD, 1.25 mg at 12/31/17 2354 .  Bevacizumab (AVASTIN) SOLN 1.25 mg, 1.25 mg, Intravitreal, , Bernarda Caffey, MD, 1.25 mg at 01/27/18 1508  Allergies Banana; Penicillins; and Strawberry extract  Review of Systems Review of Systems - Oncology ROS negative   Physical Exam  Vitals Wt Readings from Last 3 Encounters:  10/24/15 256 lb (116.1 kg)  09/26/15 257 lb (116.6 kg)  08/20/15 253 lb (114.8 kg)   Temp Readings from Last 3 Encounters:  05/27/18 97.7 F (36.5 C) (Oral)  05/13/18 97.6 F (36.4 C) (Oral)  10/24/15 98 F (36.7 C) (Oral)   BP Readings from Last 3 Encounters:  05/27/18 (!) 153/68  05/13/18 (!) 119/50  10/24/15 (!) 174/74   Pulse Readings from Last 3 Encounters:  05/27/18 74  05/13/18 83  10/24/15 78   Constitutional: Well-developed, well-nourished, and in no distress.   HENT: Head: Normocephalic and atraumatic.  Mouth/Throat: No oropharyngeal exudate. Mucosa moist. Eyes: Pupils are equal, round, and reactive to light. Conjunctivae are normal. No scleral icterus.  Neck: Normal range of motion. Neck supple. No JVD present.  Cardiovascular: Normal rate, regular rhythm and normal heart sounds.  Exam reveals no gallop and no friction rub.   No murmur heard. Pulmonary/Chest: Effort normal and breath sounds normal. No respiratory distress. No wheezes.No  rales.  Abdominal: Soft. Bowel sounds are normal. No distension. There is no tenderness. There is no guarding.  Musculoskeletal: Evidence of LE amputations.   Lymphadenopathy: No cervical, axillary or supraclavicular adenopathy.  Neurological: Alert and oriented to person, place, and time. No cranial nerve deficit.  Skin: Skin is warm and dry. No rash noted. No erythema. No pallor.  Psychiatric: Affect and judgment normal.   Labs No visits with results within 3 Day(s) from this visit.  Latest known visit with results is:  Appointment on 05/13/2018  Component Date Value Ref Range Status  . WBC 05/13/2018 14.1* 4.0 - 10.5 K/uL Final  . RBC 05/13/2018 3.38* 3.87 - 5.11 MIL/uL Final  . Hemoglobin 05/13/2018 8.5* 12.0 - 15.0 g/dL Final  . HCT 05/13/2018 28.8* 36.0 - 46.0 % Final  . MCV 05/13/2018 85.2  80.0 - 100.0 fL Final  . MCH 05/13/2018 25.1* 26.0 - 34.0 pg Final  . MCHC 05/13/2018 29.5* 30.0 - 36.0 g/dL Final  . RDW 05/13/2018 18.0* 11.5 - 15.5 % Final  . Platelets 05/13/2018 269  150 - 400 K/uL Final  . nRBC 05/13/2018 0.0  0.0 - 0.2 % Final  . Neutrophils Relative % 05/13/2018 81  % Final  . Neutro Abs 05/13/2018 11.3* 1.7 - 7.7 K/uL Final  . Lymphocytes Relative 05/13/2018 14  % Final  . Lymphs Abs 05/13/2018 1.9  0.7 - 4.0 K/uL Final  . Monocytes Relative 05/13/2018 2  % Final  . Monocytes Absolute 05/13/2018 0.3  0.1 - 1.0 K/uL Final  . Eosinophils Relative 05/13/2018 2  % Final  . Eosinophils Absolute 05/13/2018 0.3  0.0 - 0.5 K/uL Final  . Basophils Relative 05/13/2018 0  % Final  . Basophils Absolute 05/13/2018 0.0  0.0 - 0.1 K/uL Final  . Immature Granulocytes 05/13/2018 1  % Final  .  Abs Immature Granulocytes 05/13/2018 0.15* 0.00 - 0.07 K/uL Final   Performed at [facility], [place] [address], [place], [place] [address]  . Sodium 05/13/2018 142  135 - 145 mmol/L Final  . Potassium 05/13/2018 4.2  3.5 - 5.1 mmol/L Final  . Chloride 05/13/2018  109  98 - 111 mmol/L Final  . CO2 05/13/2018 22  22 - 32 mmol/L Final  . Glucose, Bld 05/13/2018 126* 70 - 99 mg/dL Final  . BUN 05/13/2018 25* 8 - 23 mg/dL Final  . Creatinine, Ser 05/13/2018 1.14* 0.44 - 1.00 mg/dL Final  . Calcium 05/13/2018 8.9  8.9 - 10.3 mg/dL Final  . Total Protein 05/13/2018 6.7  6.5 - 8.1 g/dL Final  . Albumin 05/13/2018 2.8* 3.5 - 5.0 g/dL Final  . AST 05/13/2018 8* 15 - 41 U/L Final  . ALT 05/13/2018 9  0 - 44 U/L Final  . Alkaline Phosphatase 05/13/2018 116  38 - 126 U/L Final  . Total Bilirubin 05/13/2018 <0.2* 0.3 - 1.2 mg/dL Final  . GFR calc non Af Amer 05/13/2018 51* >60 mL/min Final  . GFR calc Af Amer 05/13/2018 59* >60 mL/min Final  . Anion gap 05/13/2018 11  5 - 15 Final   Performed at [facility], [place] [address], [place], [place] [address]  . LDH 05/13/2018 109  98 - 192 U/L Final   Performed at [facility], [place] [address] [place], [place] [address]  . Total Protein ELP 05/13/2018 5.8* 6.0 - 8.5 g/dL Final  . Albumin ELP 05/13/2018 2.8* 2.9 - 4.4 g/dL Final  . Alpha-1-Globulin 05/13/2018 0.2  0.0 - 0.4 g/dL Final  . Alpha-2-Globulin 05/13/2018 0.9  0.4 - 1.0 g/dL Final  . Beta Globulin 05/13/2018 0.9  0.7 - 1.3 g/dL Final  . Gamma Globulin 05/13/2018 1.0  0.4 - 1.8 g/dL Final  . M-Spike, % 05/13/2018 Not Observed  Not Observed g/dL Final  . SPE Interp. 05/13/2018 Comment   Final   Comment: (NOTE) The SPE pattern reflects hypoalbuminemia. Evidence of monoclonal protein is not apparent. Performed At: [facility] [place], [place] [id number] Rush Farmer MD [id number]   . Comment 05/13/2018 Comment   Final   Comment: (NOTE) Protein electrophoresis scan will follow via computer, mail, or courier delivery.   Marland Kitchen GLOBULIN, TOTAL 05/13/2018 3.0  2.2 - 3.9 g/dL Corrected  . A/G Ratio 05/13/2018 0.9  0.7 - 1.7 Corrected  . Ferritin 05/13/2018 121  11 - 307 ng/mL Final    Performed at [facility], [place] [address], [place], [place] [address]  . Vitamin B-12 05/13/2018 985* 180 - 914 pg/mL Final   Comment: (NOTE) This assay is not validated for testing neonatal or myeloproliferative syndrome specimens for Vitamin B12 levels. Performed at [facility], [place] [address] [place], [place] [address]   . Methylmalonic Acid, Quantitative 05/13/2018 197  0 - 378 nmol/L Final  . Disclaimer: 05/13/2018 Comment   Final   Comment: (NOTE) This test was developed and its performance characteristics determined by [facility]. It has not been cleared or approved by the Food and Drug Administration. Performed At: [facility] [place], [place] [id number] Rush Farmer MD [id number]   . Folate 05/13/2018 6.4  >5.9 ng/mL Final   Performed at [place] [address], [place],  [address]  . Hgb A2 Quant 05/13/2018 2.0  1.8 - 3.2 %  Final  . Hgb F Quant 05/13/2018 0.0  0.0 - 2.0 % Final  . Hgb S Quant 05/13/2018 0.0  0.0 % Final  . Hgb C 05/13/2018 0.0  0.0 % Corrected  . Hgb A 05/13/2018 98.0  96.4 - 98.8 % Final  . Hgb Variant 05/13/2018 0.0  0.0 % Corrected  . Please Note: 05/13/2018 Comment   Corrected   Comment: (NOTE) Normal adult hemoglobin present. Performed At: [facility] [place], [place] [id number] Rush Farmer MD [id number]   . Haptoglobin 05/13/2018 344  37 - 355 mg/dL Final   Comment: (NOTE)              **Please note reference interval change** Performed At: [facility] [place], [place] [id number] Rush Farmer MD [id number]      Pathology Orders Placed This Encounter  Procedures  . CT CHEST W CONTRAST    Standing Status:   Future    Standing Expiration Date:   05/27/2019    Order Specific Question:   If indicated for the ordered procedure, I authorize the administration of contrast media per  Radiology protocol    Answer:   Yes    Order Specific Question:   Preferred imaging location?    Answer:   [facility]    Order Specific Question:   Radiology Contrast Protocol - do NOT remove file path    Answer:   [email or web address]  . CT ABDOMEN PELVIS W CONTRAST    Standing Status:   Future    Standing Expiration Date:   05/27/2019    Order Specific Question:   If indicated for the ordered procedure, I authorize the administration of contrast media per Radiology protocol    Answer:   Yes    Order Specific Question:   Preferred imaging location?    Answer:   [facility]    Order Specific Question:   Is Oral Contrast requested for this exam?    Answer:   Yes, Per Radiology protocol    Order Specific Question:   Radiology Contrast Protocol - do NOT remove file path    Answer:   \\[email or web address]  . CBC with Differential/Platelet    Standing Status:   Future    Standing Expiration Date:   05/28/2019  . Comprehensive metabolic panel    Standing Status:   Future    Standing Expiration Date:   05/28/2019  . Lactate dehydrogenase    Standing Status:   Future    Standing Expiration Date:   05/28/2019  . Ferritin    Standing Status:   Future    Standing Expiration Date:   05/28/2019  . Haptoglobin    Standing Status:   Future    Standing Expiration Date:   05/28/2019       Zoila Shutter MD

## 2018-05-27 NOTE — Telephone Encounter (Signed)
Printed avs and calender of upcoming appointment. Per 1/9 los 

## 2018-06-01 ENCOUNTER — Encounter (INDEPENDENT_AMBULATORY_CARE_PROVIDER_SITE_OTHER): Payer: Medicare Other | Admitting: Ophthalmology

## 2018-06-03 ENCOUNTER — Ambulatory Visit: Payer: Medicare Other | Admitting: Internal Medicine

## 2018-06-07 NOTE — Progress Notes (Signed)
[facility] Note  06/08/2018     CHIEF COMPLAINT Patient presents for Retina Follow Up   HISTORY OF PRESENT ILLNESS: [name] is a 63 y.o. female who presents to the clinic today for:   HPI    Retina Follow Up    Patient presents with  Diabetic Retinopathy.  In both eyes.  This started 2 months ago.  Severity is moderate.  Duration of 2 months.  Since onset it is stable.  I, the attending physician,  performed the HPI with the patient and updated documentation appropriately.          Comments    Patient here for 2 month Retinal follow up for NPDR OU. Patient states Vision doing ok. No eye pain.       Last edited by Bernarda Caffey, MD on 06/08/2018  3:58 PM. (History)      Referring physician: Duffy Bruce, Manya Silvas, MD [place], [place] [address]  HISTORICAL INFORMATION:   Selected notes from the MEDICAL RECORD NUMBER Referred by Dr. Monna Fam for concern of diabetic retinopathy with edema OU LEE: 05.17.19 (K. Hecker) [BCVA: OD: 20/40 OS: 20/25-1] Ocular Hx-NPDR with edema OU, DES OU, PVD OU, Pseudophakia OU PMH-DM (taking Levemir and Metformin), HTN, High Cholesterol, Current Smoker    CURRENT MEDICATIONS: Current Outpatient Medications (Ophthalmic Drugs)  Medication Sig  . cromolyn (OPTICROM) 4 % ophthalmic solution Place 1 drop into both eyes 4 (four) times daily.   Current Facility-Administered Medications (Ophthalmic Drugs)  Medication Route  . aflibercept (EYLEA) SOLN 2 mg Intravitreal   Current Outpatient Medications (Other)  Medication Sig  . acetaminophen (TYLENOL) 325 MG tablet Take 650 mg by mouth every 4 (four) hours as needed for mild pain, moderate pain, fever or headache.  Marland Kitchen aspirin 81 MG tablet Take 81 mg by mouth daily.  Marland Kitchen atorvastatin (LIPITOR) 40 MG tablet Take 40 mg by mouth daily. For Hyperlipidemia  . cetirizine (ZYRTEC) 10 MG tablet Take 10 mg by mouth daily.  Marland Kitchen docusate sodium 100 MG  CAPS Take 100 mg by mouth 2 (two) times daily.  . ferrous sulfate 325 (65 FE) MG tablet Take 325 mg by mouth daily with breakfast.  . hydrALAZINE (APRESOLINE) 25 MG tablet Take 25 mg by mouth 4 (four) times daily.  . hydrochlorothiazide (HYDRODIURIL) 25 MG tablet Take 25 mg by mouth every morning.  . insulin aspart (NOVOLOG) 100 UNIT/ML injection Inject 3 Units into the skin 3 (three) times daily before meals. Hold If BS >100  . insulin glargine (LANTUS) 100 UNIT/ML injection Inject 40 Units into the skin at bedtime.   Marland Kitchen lisinopril (PRINIVIL,ZESTRIL) 20 MG tablet Take 1 tablet (20 mg total) by mouth daily.  Marland Kitchen loperamide (IMODIUM A-D) 2 MG tablet Take 2 mg by mouth every 4 (four) hours as needed for diarrhea or loose stools.  Marland Kitchen oxyCODONE-acetaminophen (PERCOCET/ROXICET) 5-325 MG tablet Take two tablets by mouth every 4 hours as needed for pain. Do not exceed 4gm of APAP in 24 hours  . polyethylene glycol (MIRALAX / GLYCOLAX) packet Take 17 g by mouth daily as needed for moderate constipation.   Current Facility-Administered Medications (Other)  Medication Route  . Bevacizumab (AVASTIN) SOLN 1.25 mg Intravitreal  . Bevacizumab (AVASTIN) SOLN 1.25 mg Intravitreal  . Bevacizumab (AVASTIN) SOLN 1.25 mg Intravitreal  . Bevacizumab (AVASTIN) SOLN 1.25 mg Intravitreal      REVIEW OF SYSTEMS: ROS    Positive for: Endocrine, Cardiovascular, Eyes  Negative for: Constitutional, Gastrointestinal, Neurological, Skin, Genitourinary, Musculoskeletal, HENT, Respiratory, Psychiatric, Allergic/Imm, Heme/Lymph   Last edited by Theodore Demark on 06/08/2018  2:45 PM. (History)       ALLERGIES Allergies  Allergen Reactions  . Banana Swelling and Other (See Comments)    Facial swelling  . Penicillins Itching, Swelling and Rash    Face swells and break out  . Strawberry Extract Swelling and Other (See Comments)    Facial swelling    PAST MEDICAL HISTORY Past Medical History:  Diagnosis Date  .  Atypical chest pain    a. non obstructive by cath in 2008 and 2010;  b. 02/2012 Myoview: non-ischemic, EF 57%  . Cellulitis    a. 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 Constipation   . CVA (cerebral infarction)    a.  Small right parietal noted incidentally 04/2007;  b. right sided embolic CVA 12/1015;  c. 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 Diabetes mellitus, type II ([place])   . Diabetic retinopathy ([place])    NPDR w/edema OU  . Diaphragmatic hernia without mention of obstruction or gangrene   . Dry skin   . Esophageal reflux   . HTN (hypertension)   . 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 Hyperlipidemia   . Irritable bowel syndrome   . Morbid obesity ([place])   . Obesity, unspecified   . Pain in shoulder 06/28/2015  . Peripheral neuropathy   . Stroke [facility])    2013-'no residual'  . Syncope and collapse    a. near-syncopal episode in November 2008;  b. 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  . Tobacco abuse   . Vertigo    Past Surgical History:  Procedure Laterality Date  . AMPUTATION  12/30/2011   Procedure: AMPUTATION RAY;  Surgeon: Wylene Simmer, MD;  Location: [place];  Service: Orthopedics;  Laterality: Left;  LEFT FIRST RAY AMPUTATION  . AMPUTATION  01/13/2012   Procedure: AMPUTATION BELOW KNEE;  Surgeon: Wylene Simmer, MD;  Location: [place];  Service: Orthopedics;  Laterality: Left;  . AMPUTATION  03/04/2012   Procedure: AMPUTATION BELOW KNEE;  Surgeon: Wylene Simmer, MD;  Location: [place];  Service: Orthopedics;  Laterality: Left;  Revision of Left Below Knee Amputation  . AMPUTATION Right 11/02/2013   Procedure: AMPUTATION BELOW KNEE;  Surgeon: Newt Minion, MD;  Location: [place];  Service: Orthopedics;  Laterality: Right;  Right Below Knee Amputation  . CARDIAC CATHETERIZATION     LAD 30%, circumflex 50%, OM 75%, RI 60% with small branch 80%, dominant RCA 60%, EF 45-50%  . CHOLECYSTECTOMY    .  COLONOSCOPY    . EYE SURGERY Right    cataract  . I&D EXTREMITY  12/23/2011   Procedure: IRRIGATION AND DEBRIDEMENT EXTREMITY;  Surgeon: Augustin Schooling, MD;  Location: [place];  Service: Orthopedics;  Laterality: Left;  Left Foot  . I&D EXTREMITY  12/26/2011   Procedure: IRRIGATION AND DEBRIDEMENT EXTREMITY;  Surgeon: Wylene Simmer, MD;  Location: [place];  Service: Orthopedics;  Laterality: Left;  LEFT FOOT I&D WITH POSSIBLE WOUND VAC APPLICATION, POSSIBLE LEFT FIRST RAY AMPUTATION  . I&D EXTREMITY Right 10/29/2013   Procedure: IRRIGATION AND DEBRIDEMENT EXTREMITY;  Surgeon: Johnn Hai, MD;  Location: [place];  Service: Orthopedics;  Laterality: Right;  . Invasive Electrophysiologic Study  5/09   followed by insertion  of an implantable loop recorder. S/p removal   . Multiple Toe Surgeries    . TEE WITHOUT CARDIOVERSION N/A 07/07/2012   Procedure: TRANSESOPHAGEAL ECHOCARDIOGRAM (TEE);  Surgeon: Lelon Perla, MD;  [facility];  Service: Cardiovascular;  Laterality: N/A;    FAMILY HISTORY Family History  Problem Relation Age of Onset  . Pneumonia Mother   . Gallbladder disease Mother        cancer  . Heart failure Mother   . Diabetes Father   . Coronary artery disease Father   . Stroke Neg Hx     SOCIAL HISTORY Social History   Tobacco Use  . Smoking status: Current Every Day Smoker    Packs/day: 0.25    Years: 40.00    Pack years: 10.00    Types: Cigarettes  . Smokeless tobacco: Never Used  Substance Use Topics  . 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  . Drug use: No         OPHTHALMIC EXAM:  Base Eye Exam    Visual Acuity (Snellen - Linear)      Right Left   Dist [place] 20/80 -2 20/40   Dist ph [place] 20/60 20/30 +2       Tonometry (Tonopen, 2:41 PM)      Right Left   Pressure 20 17       Pupils      Dark Light Shape React APD    Right 2 1.5 Round Slow None   Left 2 105 Round Slow None       Visual Fields (Counting fingers)      Left Right    Full Full       Extraocular Movement      Right Left    Full, Ortho Full, Ortho       Neuro/Psych    Oriented x3:  Yes   Mood/Affect:  Normal       Dilation    Both eyes:  1.0% Mydriacyl, 2.5% Phenylephrine @ 2:41 PM        Slit Lamp and Fundus Exam    Slit Lamp Exam      Right Left   Lids/Lashes Dermatochalasis - upper lid, Meibomian gland dysfunction, [place] Dermatochalasis - upper lid, Meibomian gland dysfunction, Scurf   Conjunctiva/Sclera Nasal Pinguecula Nasal and temporal Pinguecula   Cornea Arcus, Debris in tear film Arcus, Debris in tear film   Anterior Chamber Deep and quiet Deep and quiet   Iris Round and dilated, No NVI Round and dilated, Transillumination defects at 0300, No NVI   Lens PC IOL in good position PC IOL in good position   Vitreous Vitreous syneresis Vitreous syneresis       Fundus Exam      Right Left   Disc Pink and Sharp, no NVD, peripapillary hemorrhage at 0300 Pallor, Sharp rim   C/D Ratio 0.4 0.3   Macula bluntedfoveal reflex, scattered Microaneurysms Blunted foveal reflex, Cystic changes. Scattered RPE changes, minimal MA   Vessels Vascular attenuation, Tortuous Vascular attenuation, Tortuous   Periphery Attached, cluster of IRH at 0200 mid-zone -- improved, scattered MA and exudates Attached, scattered DBH           IMAGING AND PROCEDURES  Imaging and Procedures for @TODAY @           ASSESSMENT/PLAN:  ICD-10-CM   1. Moderate nonproliferative diabetic retinopathy of both eyes with macular edema associated with type 2 diabetes mellitus ([place]) B35.3299 CANCELED: Intravitreal Injection, Pharmacologic Agent - OD - Right Eye  2. Retinal edema H35.81   3. Vitreomacular adhesion of right eye H43.821   4. Essential hypertension I10   5. Hypertensive retinopathy of both eyes H35.033   6. Pseudophakia of both eyes  Z96.1     1,2. Moderate Non-proliferative diabetic retinopathy OU - exam shows IRH and clusters of exudates OU (OD more central involvement than OS) - initial OCT showed diabetic macular edema, both eyes (OS noncentral)  OD also with some VMA that may be contributing to DME - FA 7.16.19 shows scattered leaking microaneurysms OU, no NV OU - S/P IVA OD #1 (06.12.19), #2 (7.16.19), #3 (08.14.19), #4 (09.11.19) - S/P IVE OD #1 (11.01.19) - delayed f/u due to transportation issues - unable to obtain OCT today due to mobility issues - VA improved OU - recommend holding off on injections today (01.21.2020) - Eylea paperwork and benefits investigation pending for 2020 - f/u in 3 months, DFE, OCT, possible IVE  3. Vitreomacular adhesion OD  - no obvious traction on OCT  - may be contributing to DME OD  4,5. Hypertensive retinopathy OU  - discussed importance of tight BP control  - monitor  6. Pseudophakia OU  - s/p CE/IOL OU  - beautiful surgeries, doing well  - monitor   Ophthalmic Meds Ordered this visit:  No orders of the defined types were placed in this encounter.      Return in about 3 months (around 09/07/2018) for f/u NPDR OU, DFE, OCT.  There are no Patient Instructions on file for this visit.   Explained the diagnoses, plan, and follow up with the patient and they expressed understanding.  Patient expressed understanding of the importance of proper follow up care.   This document serves as a record of services personally performed by Gardiner Sleeper, MD, PhD. It was created on their behalf by Ernest Mallick, OA, an ophthalmic assistant. The creation of this record is the provider's dictation and/or activities during the visit.    Electronically signed by: Ernest Mallick, OA  01.20.2020 4:40 PM     Gardiner Sleeper, M.D., Ph.D. Diseases & Surgery of the Retina and Vitreous [facility]  I have reviewed the above documentation for accuracy and  completeness, and I agree with the above. Gardiner Sleeper, M.D., Ph.D. 06/11/18 4:40 PM    Abbreviations: M myopia (nearsighted); A astigmatism; H hyperopia (farsighted); P presbyopia; Mrx spectacle prescription;  CTL contact lenses; OD right eye; OS left eye; OU both eyes  XT exotropia; ET esotropia; PEK punctate epithelial keratitis; PEE punctate epithelial erosions; DES dry eye syndrome; MGD meibomian gland dysfunction; ATs artificial tears; PFAT's preservative free artificial tears; [place] nuclear sclerotic cataract; PSC posterior subcapsular cataract; ERM epi-retinal membrane; PVD posterior vitreous detachment; RD retinal detachment; DM diabetes mellitus; DR diabetic retinopathy; NPDR non-proliferative diabetic retinopathy; PDR proliferative diabetic retinopathy; CSME clinically significant macular edema; DME diabetic macular edema; dbh dot blot hemorrhages; CWS cotton wool spot; POAG primary open angle glaucoma; C/D cup-to-disc ratio; HVF humphrey visual field; GVF goldmann visual field; OCT optical coherence tomography; IOP intraocular pressure; BRVO Branch retinal vein occlusion; CRVO central retinal vein occlusion; CRAO central retinal artery occlusion; BRAO branch retinal artery occlusion; RT retinal tear; SB scleral buckle; PPV pars plana vitrectomy; VH Vitreous hemorrhage; PRP panretinal laser photocoagulation; IVK  intravitreal kenalog; VMT vitreomacular traction; MH Macular hole;  NVD neovascularization of the disc; NVE neovascularization elsewhere; AREDS age related eye disease study; ARMD age related macular degeneration; POAG primary open angle glaucoma; EBMD epithelial/anterior basement membrane dystrophy; ACIOL anterior chamber intraocular lens; IOL intraocular lens; PCIOL posterior chamber intraocular lens; Phaco/IOL phacoemulsification with intraocular lens placement; [place] photorefractive keratectomy; LASIK laser assisted in situ keratomileusis; HTN hypertension; DM diabetes mellitus; COPD  chronic obstructive pulmonary disease

## 2018-06-08 ENCOUNTER — Ambulatory Visit (INDEPENDENT_AMBULATORY_CARE_PROVIDER_SITE_OTHER): Payer: Medicare Other | Admitting: Ophthalmology

## 2018-06-08 DIAGNOSIS — H3581 Retinal edema: Secondary | ICD-10-CM

## 2018-06-08 DIAGNOSIS — H43821 Vitreomacular adhesion, right eye: Secondary | ICD-10-CM | POA: Diagnosis not present

## 2018-06-08 DIAGNOSIS — I10 Essential (primary) hypertension: Secondary | ICD-10-CM | POA: Diagnosis not present

## 2018-06-08 DIAGNOSIS — E113313 Type 2 diabetes mellitus with moderate nonproliferative diabetic retinopathy with macular edema, bilateral: Secondary | ICD-10-CM | POA: Diagnosis not present

## 2018-06-08 DIAGNOSIS — H35033 Hypertensive retinopathy, bilateral: Secondary | ICD-10-CM

## 2018-06-08 DIAGNOSIS — Z961 Presence of intraocular lens: Secondary | ICD-10-CM

## 2018-06-09 ENCOUNTER — Ambulatory Visit: Payer: Medicare Other

## 2018-06-09 ENCOUNTER — Ambulatory Visit (HOSPITAL_COMMUNITY)
Admission: RE | Admit: 2018-06-09 | Discharge: 2018-06-09 | Disposition: A | Discharge status: Home / Self Care | Payer: Medicare Other | Source: Ambulatory Visit | Attending: Internal Medicine | Admitting: Internal Medicine

## 2018-06-09 ENCOUNTER — Encounter (HOSPITAL_COMMUNITY): Payer: Self-pay

## 2018-06-09 DIAGNOSIS — R0602 Shortness of breath: Secondary | ICD-10-CM | POA: Insufficient documentation

## 2018-06-09 DIAGNOSIS — R634 Abnormal weight loss: Secondary | ICD-10-CM | POA: Insufficient documentation

## 2018-06-09 DIAGNOSIS — Z720 Tobacco use: Secondary | ICD-10-CM | POA: Diagnosis present

## 2018-06-09 MED ORDER — IOHEXOL 300 MG/ML  SOLN
100.0000 mL | Freq: Once | INTRAMUSCULAR | Status: AC | PRN
  Administered 2018-06-09: 100 mL via INTRAVENOUS

## 2018-06-09 MED ORDER — SODIUM CHLORIDE (PF) 0.9 % IJ SOLN
INTRAMUSCULAR | Status: AC
  Filled 2018-06-09: qty 50

## 2018-06-11 ENCOUNTER — Encounter (INDEPENDENT_AMBULATORY_CARE_PROVIDER_SITE_OTHER): Payer: Self-pay | Admitting: Ophthalmology

## 2018-06-11 ENCOUNTER — Ambulatory Visit: Payer: Medicare Other | Admitting: Internal Medicine

## 2018-06-17 ENCOUNTER — Inpatient Hospital Stay (HOSPITAL_BASED_OUTPATIENT_CLINIC_OR_DEPARTMENT_OTHER): Payer: Medicare Other | Admitting: Internal Medicine

## 2018-06-17 ENCOUNTER — Encounter: Payer: Self-pay | Admitting: Internal Medicine

## 2018-06-17 ENCOUNTER — Telehealth: Payer: Self-pay | Admitting: Medical Oncology

## 2018-06-17 ENCOUNTER — Telehealth: Payer: Self-pay | Admitting: Internal Medicine

## 2018-06-17 ENCOUNTER — Inpatient Hospital Stay: Payer: Medicare Other

## 2018-06-17 VITALS — BP 157/78 | HR 79 | Temp 98.0°F | Resp 18 | Ht 64.0 in

## 2018-06-17 DIAGNOSIS — I10 Essential (primary) hypertension: Secondary | ICD-10-CM | POA: Diagnosis not present

## 2018-06-17 DIAGNOSIS — R109 Unspecified abdominal pain: Secondary | ICD-10-CM

## 2018-06-17 DIAGNOSIS — R0602 Shortness of breath: Secondary | ICD-10-CM | POA: Diagnosis not present

## 2018-06-17 DIAGNOSIS — Z794 Long term (current) use of insulin: Secondary | ICD-10-CM

## 2018-06-17 DIAGNOSIS — D649 Anemia, unspecified: Secondary | ICD-10-CM

## 2018-06-17 DIAGNOSIS — F17210 Nicotine dependence, cigarettes, uncomplicated: Secondary | ICD-10-CM

## 2018-06-17 DIAGNOSIS — R634 Abnormal weight loss: Secondary | ICD-10-CM

## 2018-06-17 DIAGNOSIS — R1084 Generalized abdominal pain: Secondary | ICD-10-CM

## 2018-06-17 DIAGNOSIS — Z720 Tobacco use: Secondary | ICD-10-CM

## 2018-06-17 DIAGNOSIS — E785 Hyperlipidemia, unspecified: Secondary | ICD-10-CM

## 2018-06-17 DIAGNOSIS — Z8673 Personal history of transient ischemic attack (TIA), and cerebral infarction without residual deficits: Secondary | ICD-10-CM

## 2018-06-17 DIAGNOSIS — Z7982 Long term (current) use of aspirin: Secondary | ICD-10-CM

## 2018-06-17 DIAGNOSIS — Z79899 Other long term (current) drug therapy: Secondary | ICD-10-CM

## 2018-06-17 DIAGNOSIS — Z8249 Family history of ischemic heart disease and other diseases of the circulatory system: Secondary | ICD-10-CM

## 2018-06-17 DIAGNOSIS — E119 Type 2 diabetes mellitus without complications: Secondary | ICD-10-CM

## 2018-06-17 DIAGNOSIS — Z1239 Encounter for other screening for malignant neoplasm of breast: Secondary | ICD-10-CM

## 2018-06-17 LAB — COMPREHENSIVE METABOLIC PANEL
ALT: 7 U/L (ref 0–44)
AST: 6 U/L — ABNORMAL LOW (ref 15–41)
Albumin: 3.2 g/dL — ABNORMAL LOW (ref 3.5–5.0)
Alkaline Phosphatase: 125 U/L (ref 38–126)
Anion gap: 13 (ref 5–15)
BUN: 21 mg/dL (ref 8–23)
CO2: 23 mmol/L (ref 22–32)
Calcium: 9.2 mg/dL (ref 8.9–10.3)
Chloride: 107 mmol/L (ref 98–111)
Creatinine, Ser: 1.04 mg/dL — ABNORMAL HIGH (ref 0.44–1.00)
GFR calc Af Amer: >60 mL/min (ref >60)
GFR calc non Af Amer: 57 mL/min — ABNORMAL LOW (ref >60)
GLUCOSE: 122 mg/dL — AB (ref 70–99)
Potassium: 4.3 mmol/L (ref 3.5–5.1)
SODIUM: 143 mmol/L (ref 135–145)
Total Bilirubin: <0.2 mg/dL — ABNORMAL LOW (ref 0.3–1.2)
Total Protein: 6.9 g/dL (ref 6.5–8.1)

## 2018-06-17 LAB — CBC WITH DIFFERENTIAL/PLATELET
Abs Immature Granulocytes: 0.12 10*3/uL — ABNORMAL HIGH (ref 0.00–0.07)
Basophils Absolute: 0.1 10*3/uL (ref 0.0–0.1)
Basophils Relative: 0 %
Eosinophils Absolute: 0.2 10*3/uL (ref 0.0–0.5)
Eosinophils Relative: 1 %
HCT: 30.6 % — ABNORMAL LOW (ref 36.0–46.0)
HEMOGLOBIN: 9.2 g/dL — AB (ref 12.0–15.0)
IMMATURE GRANULOCYTES: 1 %
Lymphocytes Relative: 15 %
Lymphs Abs: 2.2 10*3/uL (ref 0.7–4.0)
MCH: 25.6 pg — ABNORMAL LOW (ref 26.0–34.0)
MCHC: 30.1 g/dL (ref 30.0–36.0)
MCV: 85 fL (ref 80.0–100.0)
Monocytes Absolute: 0.4 10*3/uL (ref 0.1–1.0)
Monocytes Relative: 3 %
NEUTROS PCT: 80 %
Neutro Abs: 11.5 10*3/uL — ABNORMAL HIGH (ref 1.7–7.7)
Platelets: 317 10*3/uL (ref 150–400)
RBC: 3.6 MIL/uL — AB (ref 3.87–5.11)
RDW: 17.6 % — ABNORMAL HIGH (ref 11.5–15.5)
WBC: 14.5 10*3/uL — ABNORMAL HIGH (ref 4.0–10.5)
nRBC: 0 % (ref 0.0–0.2)

## 2018-06-17 LAB — LACTATE DEHYDROGENASE: LDH: 112 U/L (ref 98–192)

## 2018-06-17 LAB — FERRITIN: Ferritin: 116 ng/mL (ref 11–307)

## 2018-06-17 NOTE — Telephone Encounter (Signed)
Referral for colonscopy not in the system yet.  Scheduled appt per 01/30 los.  Printed calendar and avs.

## 2018-06-17 NOTE — Telephone Encounter (Signed)
Information re : eye injections given to Dr Walden Field.

## 2018-06-17 NOTE — Progress Notes (Signed)
Diagnosis Normocytic anemia - Plan: CBC with Differential/Platelet, Comprehensive metabolic panel, Lactate dehydrogenase, Ferritin, MM 3D SCREEN BREAST BILATERAL  Screening breast examination - Plan: CBC with Differential/Platelet, Comprehensive metabolic panel, Lactate dehydrogenase, Ferritin, MM 3D SCREEN BREAST BILATERAL  Staging Cancer Staging No matching staging information was found for the patient.  Assessment and Plan:   1.  Normocytic anemia.  63 year old female referred for evaluation of anemia.  Labs done 11/09/2017 showed WBC 12.8, HB 9.1 plts 303,000.  Chemistries WNL with K+ 4.4 Cr 1.17, Sed rate 66. Labs done 03/17/2018 showed WBC 12.7 HB 8.9 and plts 302,000. Pt denies transfusion history. She denies any blood in stool or urine.  She reports some mild left LQ abdominal pain.  Pt had Leg amputations in 2013 and 2015.  She had colonoscopy in 06/02/2007 with pathology returning as tubular adenoma and with no evidence of dysplasia or malignancy. She denies any family history of blood disorders.  She denies any breast surgery.    Labs done 05/13/2018 showed WBC 14.1 HB 8.5 plts 269,000.  Chemistries WNL with K+ 4.2 Cr 1.14 and normal LFTs.  Ferritin 121.  SPEP negative,normal haptoglobin B12, Folate, MMA and Hb electrophoresis.    Labs done 05/21/2017 reviewed and showed WBC 14.5 hb 9.2 plts 317,000.  Chemistries WNL with K+ 4.3, Cr 1 and normal LFTs.    Review of record indicates pt has been receiving ophthalmic Avastin with ophthalmology .   There is potential risk for anemia with this medication.  Will have pt RTC in 07/2018 with labs as she reports her next injection is in April 2020  Last injection was in 03/2018.  HB is improved at 9.2.  Pt is referred to GI for evaluation.    2  Abdominal pain.  Pt has normal LFTs.  Pt had abdominal USN in 2004 that showed fatty liver.  She continues to report abdominal pain.  CT CAP done 06/09/2018 reviewed and showed   IMPRESSION: 1. No findings to  explain the patient's given symptoms. 2. Hepatomegaly. 3. Stable left adrenal nodule, indicative of an adenoma. 4. Aortic atherosclerosis (ICD10-170.0). Coronary artery Calcification.  Pt is referred to GI for colonoscopy evaluation.    3.  SOB.  Pulse ox is 98% on room air.  CT chest shows no significant lung abnormalities.  Discuss with nursing facility if ongoing symptoms and they may refer to pulmonary for evaluation.    4. HTN.  BP is 157/78.  Follow-up with PCP.    5.  Health maintenance.  Pt had colonoscopy in 2009.  Pt referred to GI for evaluation due to anemia.  Pt has not had mammogram since 2011.  She is set up for screening mammogram.    25 minutes spent with more than 50% spent in review of records, counseling and coordination of care.    Interval History:  Historical data obtained from note dated 05/13/2018.  63 year old female referred for evaluation of anemia.  Pt had labs done 11/09/2017 that showed WBC 12.8, HB 9.1 plts 303,000.  Chemistries WNL with K+ 4.4 Cr 1.17, Sed rate 66. Labs done 03/17/2018 showed WBC 12.7 HB 8.9 and plts 302,000. Pt denies transfusion history. She denies any blood in stool or urine.  She reports some mild left LQ abdominal pain.  Pt had Leg amputations in 2013 and 2015.  She had colonoscopy in 06/02/2007 with pathology returning as tubular adenoma and hyperplastic malignancy with no evidence of dysplasia or malignancy. She denies any  family history of blood disorders.  She denies any breast surgery.   Current Status:  Pt is seen today for follow-up.   She is here to go over labs and scans.  She is accompanied by family member.    Problem List Patient Active Problem List   Diagnosis Date Noted  . Phantom limb pain ([facility]) [G54.6] 11/09/2017  . Other abnormalities of gait and mobility [R26.89] 11/09/2017  . S/P BKA (below knee amputation) bilateral ([place]) [O27.741, Z89.511] 08/20/2015  . Dental caries [K02.9] 08/20/2015  . Constipation [K59.00]  08/15/2015  . Depression [F32.9] 08/15/2015  . Pain in shoulder [M25.519] 06/28/2015  . Hemiparesis ([place]) [G81.90] 01/10/2013  . Tobacco abuse [Z72.0]   . Paresthesia of right arm and leg [R20.2] 12/17/2011  . Facial droop [R29.810] 12/15/2011  . TIA (transient ischemic attack) [G45.9] 12/15/2011  . Syncope [R55] 07/02/2011  . HTN (hypertension) [I10] 07/02/2011  . UTI (lower urinary tract infection) [N39.0] 07/02/2011  . Normocytic anemia [D64.9] 07/02/2011  . 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 CAD, NATIVE VESSEL [I25.10] 04/02/2009  . HIATAL HERNIA [K44.9] 04/02/2009  . Irritable bowel syndrome [K58.9] 04/02/2009  . Hyperlipidemia [E78.5] 05/30/2008  . OBESITY [E66.9] 05/30/2008  . LEFT VENTRICULAR FUNCTION, DECREASED [I25.10] 05/30/2008  . CVA (cerebral infarction) [I63.9] 05/30/2008  . GERD [K21.9] 05/30/2008    Past Medical History Past Medical History:  Diagnosis Date  . Atypical chest pain    a. non obstructive by cath in 2008 and 2010;  b. 02/2012 Myoview: non-ischemic, EF 57%  . Cellulitis    a. 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 Constipation   . CVA (cerebral infarction)    a.  Small right parietal noted incidentally 04/2007;  b. right sided embolic CVA 06/8784;  c. 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 Diabetes mellitus, type II ([place])   . Diabetic retinopathy ([place])    NPDR w/edema OU  . Diaphragmatic hernia without mention of obstruction or gangrene   . Dry skin   . Esophageal reflux   . HTN (hypertension)   . 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 Hyperlipidemia   . Irritable bowel syndrome   . Morbid obesity ([place])   . Obesity, unspecified   . Pain in shoulder 06/28/2015  . Peripheral neuropathy   . Stroke [facility])    2013-'no residual'  . Syncope and collapse    a. near-syncopal episode in November 2008;  b. s/p prior ILR->  unrevealing->explanted.  . Tobacco abuse   . Vertigo     Past Surgical History Past Surgical History:  Procedure Laterality Date  . AMPUTATION  12/30/2011   Procedure: AMPUTATION RAY;  Surgeon: Wylene Simmer, MD;  Location: [place];  Service: Orthopedics;  Laterality: Left;  LEFT FIRST RAY AMPUTATION  . AMPUTATION  01/13/2012   Procedure: AMPUTATION BELOW KNEE;  Surgeon: Wylene Simmer, MD;  Location: [place];  Service: Orthopedics;  Laterality: Left;  . AMPUTATION  03/04/2012   Procedure: AMPUTATION BELOW KNEE;  Surgeon: Wylene Simmer, MD;  Location: [place];  Service: Orthopedics;  Laterality: Left;  Revision of Left Below Knee Amputation  . AMPUTATION Right 11/02/2013   Procedure: AMPUTATION BELOW KNEE;  Surgeon: Newt Minion, MD;  Location: [place];  Service: Orthopedics;  Laterality: Right;  Right Below Knee Amputation  . CARDIAC CATHETERIZATION  LAD 30%, circumflex 50%, OM 75%, RI 60% with small branch 80%, dominant RCA 60%, EF 45-50%  . CHOLECYSTECTOMY    . COLONOSCOPY    . EYE SURGERY Right    cataract  . I&D EXTREMITY  12/23/2011   Procedure: IRRIGATION AND DEBRIDEMENT EXTREMITY;  Surgeon: Augustin Schooling, MD;  Location: [place];  Service: Orthopedics;  Laterality: Left;  Left Foot  . I&D EXTREMITY  12/26/2011   Procedure: IRRIGATION AND DEBRIDEMENT EXTREMITY;  Surgeon: Wylene Simmer, MD;  Location: [place];  Service: Orthopedics;  Laterality: Left;  LEFT FOOT I&D WITH POSSIBLE WOUND VAC APPLICATION, POSSIBLE LEFT FIRST RAY AMPUTATION  . I&D EXTREMITY Right 10/29/2013   Procedure: IRRIGATION AND DEBRIDEMENT EXTREMITY;  Surgeon: Johnn Hai, MD;  Location: [place];  Service: Orthopedics;  Laterality: Right;  . Invasive Electrophysiologic Study  5/09   followed by insertion of an implantable loop recorder. S/p removal   . Multiple Toe Surgeries    . TEE WITHOUT CARDIOVERSION N/A 07/07/2012   Procedure: TRANSESOPHAGEAL ECHOCARDIOGRAM (TEE);  Surgeon: Lelon Perla, MD;  [facility];   Service: Cardiovascular;  Laterality: N/A;    Family History Family History  Problem Relation Age of Onset  . Pneumonia Mother   . Gallbladder disease Mother        cancer  . Heart failure Mother   . Diabetes Father   . Coronary artery disease Father   . Stroke Neg Hx      Social History  reports that she has been smoking cigarettes. She has a 10.00 pack-year smoking history. She has never used smokeless tobacco. She reports current alcohol use of about 4.0 standard drinks of alcohol per week. She reports that she does not use drugs.  Medications  Current Outpatient Medications:  .  acetaminophen (TYLENOL) 325 MG tablet, Take 650 mg by mouth every 4 (four) hours as needed for mild pain, moderate pain, fever or headache., Disp: , Rfl:  .  aspirin 81 MG tablet, Take 81 mg by mouth daily., Disp: , Rfl:  .  atorvastatin (LIPITOR) 40 MG tablet, Take 40 mg by mouth daily. For Hyperlipidemia, Disp: , Rfl:  .  cetirizine (ZYRTEC) 10 MG tablet, Take 10 mg by mouth daily., Disp: , Rfl:  .  cromolyn (OPTICROM) 4 % ophthalmic solution, Place 1 drop into both eyes 4 (four) times daily., Disp: , Rfl:  .  docusate sodium 100 MG CAPS, Take 100 mg by mouth 2 (two) times daily., Disp: 10 capsule, Rfl: 0 .  ferrous sulfate 325 (65 FE) MG tablet, Take 325 mg by mouth daily with breakfast., Disp: , Rfl:  .  hydrALAZINE (APRESOLINE) 25 MG tablet, Take 25 mg by mouth 4 (four) times daily., Disp: , Rfl:  .  hydrochlorothiazide (HYDRODIURIL) 25 MG tablet, Take 25 mg by mouth every morning., Disp: , Rfl:  .  insulin aspart (NOVOLOG) 100 UNIT/ML injection, Inject 3 Units into the skin 3 (three) times daily before meals. Hold If BS >100, Disp: , Rfl:  .  insulin glargine (LANTUS) 100 UNIT/ML injection, Inject 40 Units into the skin at bedtime. , Disp: , Rfl:  .  lisinopril (PRINIVIL,ZESTRIL) 20 MG tablet, Take 1 tablet (20 mg total) by mouth daily., Disp: 30 tablet, Rfl: 1 .  loperamide (IMODIUM A-D) 2 MG  tablet, Take 2 mg by mouth every 4 (four) hours as needed for diarrhea or loose stools., Disp: , Rfl:  .  oxyCODONE-acetaminophen (PERCOCET/ROXICET) 5-325 MG tablet, Take two tablets  by mouth every 4 hours as needed for pain. Do not exceed 4gm of APAP in 24 hours, Disp: 360 tablet, Rfl: 0 .  polyethylene glycol (MIRALAX / GLYCOLAX) packet, Take 17 g by mouth daily as needed for moderate constipation., Disp: 14 each, Rfl: 0  Current Facility-Administered Medications:  .  aflibercept (EYLEA) SOLN 2 mg, 2 mg, Intravitreal, , Bernarda Caffey, MD, 2 mg at 03/19/18 1621 .  Bevacizumab (AVASTIN) SOLN 1.25 mg, 1.25 mg, Intravitreal, , Bernarda Caffey, MD, 1.25 mg at 11/01/17 2300 .  Bevacizumab (AVASTIN) SOLN 1.25 mg, 1.25 mg, Intravitreal, , Bernarda Caffey, MD, 1.25 mg at 12/01/17 1437 .  Bevacizumab (AVASTIN) SOLN 1.25 mg, 1.25 mg, Intravitreal, , Bernarda Caffey, MD, 1.25 mg at 12/31/17 2354 .  Bevacizumab (AVASTIN) SOLN 1.25 mg, 1.25 mg, Intravitreal, , Bernarda Caffey, MD, 1.25 mg at 01/27/18 1508  Allergies Banana; Penicillins; and Strawberry extract  Review of Systems Review of Systems - Oncology ROS negative other than abdominal pain   Physical Exam  Vitals Wt Readings from Last 3 Encounters:  10/24/15 256 lb (116.1 kg)  09/26/15 257 lb (116.6 kg)  08/20/15 253 lb (114.8 kg)   Temp Readings from Last 3 Encounters:  06/17/18 98 F (36.7 C) (Oral)  05/27/18 97.7 F (36.5 C) (Oral)  05/13/18 97.6 F (36.4 C) (Oral)   BP Readings from Last 3 Encounters:  06/17/18 (!) 157/78  05/27/18 (!) 153/68  05/13/18 (!) 119/50   Pulse Readings from Last 3 Encounters:  06/17/18 79  05/27/18 74  05/13/18 83   Constitutional: Well-developed, well-nourished, and in no distress.  Accompanied by family member.   HENT: Head: Normocephalic and atraumatic.  Mouth/Throat: No oropharyngeal exudate. Mucosa moist. Eyes: Pupils are equal, round, and reactive to light. Conjunctivae are normal. No scleral  icterus.  Neck: Normal range of motion. Neck supple. No JVD present.  Cardiovascular: Normal rate, regular rhythm and normal heart sounds.  Exam reveals no gallop and no friction rub.   No murmur heard. Pulmonary/Chest: Effort normal and breath sounds normal. No respiratory distress. No wheezes.No rales.  Abdominal: Soft. Bowel sounds are normal. Obese.  Epigastric tenderness. There is no guarding.  Musculoskeletal: No edema or tenderness.  Lymphadenopathy: No cervical, axillary or supraclavicular adenopathy.  Neurological: Alert and oriented to person, place, and time. No cranial nerve deficit.  Skin: Skin is warm and dry. No rash noted. No erythema. No pallor.  Psychiatric: Affect and judgment normal.  Breast exam:  Chaperone present.  No dominant masses palpable bilaterally  Labs Appointment on 06/17/2018  Component Date Value Ref Range Status  . WBC 06/17/2018 14.5* 4.0 - 10.5 K/uL Final  . RBC 06/17/2018 3.60* 3.87 - 5.11 MIL/uL Final  . Hemoglobin 06/17/2018 9.2* 12.0 - 15.0 g/dL Final  . HCT 06/17/2018 30.6* 36.0 - 46.0 % Final  . MCV 06/17/2018 85.0  80.0 - 100.0 fL Final  . MCH 06/17/2018 25.6* 26.0 - 34.0 pg Final  . MCHC 06/17/2018 30.1  30.0 - 36.0 g/dL Final  . RDW 06/17/2018 17.6* 11.5 - 15.5 % Final  . Platelets 06/17/2018 317  150 - 400 K/uL Final  . nRBC 06/17/2018 0.0  0.0 - 0.2 % Final  . Neutrophils Relative % 06/17/2018 80  % Final  . Neutro Abs 06/17/2018 11.5* 1.7 - 7.7 K/uL Final  . Lymphocytes Relative 06/17/2018 15  % Final  . Lymphs Abs 06/17/2018 2.2  0.7 - 4.0 K/uL Final  . Monocytes Relative 06/17/2018 3  % Final  .  Monocytes Absolute 06/17/2018 0.4  0.1 - 1.0 K/uL Final  . Eosinophils Relative 06/17/2018 1  % Final  . Eosinophils Absolute 06/17/2018 0.2  0.0 - 0.5 K/uL Final  . Basophils Relative 06/17/2018 0  % Final  . Basophils Absolute 06/17/2018 0.1  0.0 - 0.1 K/uL Final  . Immature Granulocytes 06/17/2018 1  % Final  . Abs Immature  Granulocytes 06/17/2018 0.12* 0.00 - 0.07 K/uL Final   Performed at [facility], [place] [address] [place], [place] [address]  . Sodium 06/17/2018 143  135 - 145 mmol/L Final  . Potassium 06/17/2018 4.3  3.5 - 5.1 mmol/L Final  . Chloride 06/17/2018 107  98 - 111 mmol/L Final  . CO2 06/17/2018 23  22 - 32 mmol/L Final  . Glucose, Bld 06/17/2018 122* 70 - 99 mg/dL Final  . BUN 06/17/2018 21  8 - 23 mg/dL Final  . Creatinine, Ser 06/17/2018 1.04* 0.44 - 1.00 mg/dL Final  . Calcium 06/17/2018 9.2  8.9 - 10.3 mg/dL Final  . Total Protein 06/17/2018 6.9  6.5 - 8.1 g/dL Final  . Albumin 06/17/2018 3.2* 3.5 - 5.0 g/dL Final  . AST 06/17/2018 6* 15 - 41 U/L Final  . ALT 06/17/2018 7  0 - 44 U/L Final  . Alkaline Phosphatase 06/17/2018 125  38 - 126 U/L Final  . Total Bilirubin 06/17/2018 <0.2* 0.3 - 1.2 mg/dL Final  . GFR calc non Af Amer 06/17/2018 57* >60 mL/min Final  . GFR calc Af Amer 06/17/2018 >60  >60 mL/min Final  . Anion gap 06/17/2018 13  5 - 15 Final   Performed at [facility], [place] [address], [place], [place] [address]  . LDH 06/17/2018 112  98 - 192 U/L Final   Performed at [facility], [place] [address] [place], [place] [address]  . Ferritin 06/17/2018 116  11 - 307 ng/mL Final   Performed at [facility], [place] [address] [place], [place] [address]     Pathology Orders Placed This Encounter  Procedures  . MM 3D SCREEN BREAST BILATERAL    Standing Status:   Future    Standing Expiration Date:   08/16/2019    Order Specific Question:   Reason for Exam (SYMPTOM  OR DIAGNOSIS REQUIRED)    Answer:   screening    Order Specific Question:   Preferred imaging location?    Answer:   [facility]  . CBC with Differential/Platelet    Standing Status:   Future    Standing Expiration Date:   06/18/2019  . Comprehensive metabolic panel    Standing Status:   Future    Standing  Expiration Date:   06/18/2019  . Lactate dehydrogenase    Standing Status:   Future    Standing Expiration Date:   06/18/2019  . Ferritin    Standing Status:   Future    Standing Expiration Date:   06/18/2019       Zoila Shutter MD

## 2018-06-18 LAB — HAPTOGLOBIN: Haptoglobin: 329 mg/dL (ref 37–355)

## 2018-07-14 ENCOUNTER — Telehealth: Payer: Self-pay | Admitting: Gastroenterology

## 2018-07-14 NOTE — Telephone Encounter (Signed)
Spoke to patient regarding referral from Dr. Mathis Dad [name] pt states that she is in a nursing home.  Advised that DOD will need to review records before scheduling appointment. Please review and advise

## 2018-07-20 ENCOUNTER — Ambulatory Visit: Payer: Medicare Other | Admitting: Gastroenterology

## 2018-07-23 ENCOUNTER — Non-Acute Institutional Stay: Payer: Medicare Other | Admitting: Adult Health Nurse Practitioner

## 2018-07-23 DIAGNOSIS — Z515 Encounter for palliative care: Secondary | ICD-10-CM

## 2018-07-23 NOTE — Progress Notes (Signed)
[facility] Consult Note Telephone: [phone]  Fax: [phone] 07/23/2018  PATIENT NAME: [name] DOB: 01-Sep-1955 MRN: [id number]  PRIMARY CARE PROVIDER:   Duffy Bruce, Manya Silvas, MD  REFERRING PROVIDER:  Duffy Bruce, Manya Silvas, MD [place], [place] [address]  RESPONSIBLE PARTY:   Self and [place](neice) [phone]     RECOMMENDATIONS and PLAN:  1. Phantom limb pain.  Patient has not complaints of pain at this time.  Does state that her pain is well controlled.  Continue to monitor  2.  Abnormalities of gait and mobility.  Patient is more mobile in wheelchair but needs encouragement to get out of bed at times  3.  Advanced care planning:  Patient still endorses wanting to be a full code.  Though she is a long term resident, she states wanting to go home but also states that she does not have anyone to help her at home   I spent 15 minutes providing this consultation,  from 9:00 pm to 9:15 pm at [facility]  More than 50% of the time in this consultation was spent coordinating communication.    HISTORY OF PRESENT ILLNESS:  GAYNELL EGGLETON is a 63 y.o. year old female with multiple medical problems including bilateral BKA, DMT2, HTN, HLD, history of CVA. Palliative Care was asked to help address goals of care. Patient has no new complaints today.  No recent hospitalizations, falls, or infections.    CODE STATUS: Full Code  PPS: 40% HOSPICE ELIGIBILITY/DIAGNOSIS: TBD  PHYSICAL EXAM:   General: patient lying in bed in NAD Cardiovascular: regular rate and rhythm Pulmonary: lung sounds clear with normal respiratory effort Abdomen: soft, nontender, + bowel sounds GU: no suprapubic tenderness Extremities: Bilateral BKA Skin: no rashes Neurological: A&O x3  PAST MEDICAL HISTORY:  Past Medical History:  Diagnosis Date  . Atypical chest pain    a. non obstructive by cath in 2008 and 2010;  b.  02/2012 Myoview: non-ischemic, EF 57%  . Cellulitis    a. 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 Constipation   . CVA (cerebral infarction)    a.  Small right parietal noted incidentally 04/2007;  b. right sided embolic CVA 0/2637;  c. 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 Diabetes mellitus, type II ([place])   . Diabetic retinopathy ([place])    NPDR w/edema OU  . Diaphragmatic hernia without mention of obstruction or gangrene   . Dry skin   . Esophageal reflux   . HTN (hypertension)   . 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 Hyperlipidemia   . Irritable bowel syndrome   . Morbid obesity ([place])   . Obesity, unspecified   . Pain in shoulder 06/28/2015  . Peripheral neuropathy   . Stroke [facility])    2013-'no residual'  . Syncope and collapse    a. near-syncopal episode in November 2008;  b. 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  . Tobacco abuse   . Vertigo     SOCIAL HX:  Social History   Tobacco Use  . Smoking status: Current Every Day Smoker    Packs/day: 0.25    Years: 40.00    Pack years: 10.00    Types: Cigarettes  . Smokeless tobacco: Never Used  Substance Use Topics  . Alcohol use: Yes    Alcohol/week: 4.0  standard drinks    Types: 4 Cans of beer per week    Comment: occ- foot ball season- 4 beers a week during football season.    ALLERGIES:  Allergies  Allergen Reactions  . Banana Swelling and Other (See Comments)    Facial swelling  . Penicillins Itching, Swelling and Rash    Face swells and break out  . Strawberry Extract Swelling and Other (See Comments)    Facial swelling     PERTINENT MEDICATIONS:  Outpatient Encounter Medications as of 07/23/2018  Medication Sig  . acetaminophen (TYLENOL) 325 MG tablet Take 650 mg by mouth every 4 (four) hours as needed for mild pain, moderate pain, fever or headache.  [place] Kitchen amLODipine (NORVASC) 2.5 MG tablet Take 2.5 mg by mouth daily.  Mar[place] Kitchen  aspirin 81 MG tablet Take 81 mg by mouth daily.  [place] Kitchen atorvastatin (LIPITOR) 20 MG tablet Take 20 mg by mouth daily.  . carvedilol (COREG) 12.5 MG tablet Take 1 tablet by mouth 2 (two) times daily.  . cetirizine (ZYRTEC) 10 MG tablet Take 10 mg by mouth daily.  . cromolyn (OPTICROM) 4 % ophthalmic solution Place 1 drop into both eyes 4 (four) times daily.  . cyanocobalamin 1000 MCG tablet Take 1,000 mcg by mouth daily.  [place] Kitchen docusate sodium 100 MG CAPS Take 100 mg by mouth 2 (two) times daily.  . ferrous sulfate 325 (65 FE) MG tablet Take 325 mg by mouth daily with breakfast.  . gabapentin (NEURONTIN) 400 MG capsule Take 1 capsule by mouth daily.  . hydrALAZINE (APRESOLINE) 50 MG tablet Take 50 mg by mouth 4 (four) times daily -  before meals and at bedtime.  [place] Kitchen HYDROcodone-acetaminophen (NORCO/VICODIN) 5-325 MG tablet Take 1 tablet by mouth 3 (three) times daily as needed for moderate pain.  [place] Kitchen insulin aspart (NOVOLOG) 100 UNIT/ML injection Inject 3 Units into the skin 3 (three) times daily before meals. Hold If BS >100  . JANUVIA 50 MG tablet Take 50 mg by mouth daily.  [place] Kitchen LEVEMIR 100 UNIT/ML injection Inject 48 Units into the skin at bedtime.  [place] Kitchen lisinopril (PRINIVIL,ZESTRIL) 20 MG tablet Take 1 tablet (20 mg total) by mouth daily.  [place] Kitchen loperamide (IMODIUM A-D) 2 MG tablet Take 2 mg by mouth every 4 (four) hours as needed for diarrhea or loose stools.  [place] Kitchen PAZEO 0.7 % SOLN   . polyethylene glycol (MIRALAX / GLYCOLAX) packet Take 17 g by mouth daily as needed for moderate constipation.  . sertraline (ZOLOFT) 25 MG tablet Take 25 mg by mouth daily.  . vitamin C (ASCORBIC ACID) 500 MG tablet Take 500 mg by mouth daily.   Facility-Administered Encounter Medications as of 07/23/2018  Medication  . aflibercept (EYLEA) SOLN 2 mg  . Bevacizumab (AVASTIN) SOLN 1.25 mg  . Bevacizumab (AVASTIN) SOLN 1.25 mg  . Bevacizumab (AVASTIN) SOLN 1.25 mg  . Bevacizumab (AVASTIN) SOLN 1.25 mg      Berneta Sages,  NP

## 2018-08-13 ENCOUNTER — Inpatient Hospital Stay: Payer: Medicare Other | Admitting: Internal Medicine

## 2018-08-13 ENCOUNTER — Inpatient Hospital Stay: Payer: Medicare Other | Attending: Internal Medicine

## 2018-08-19 ENCOUNTER — Other Ambulatory Visit: Payer: Self-pay

## 2018-08-19 NOTE — Patient Outreach (Signed)
[facility]) Care Management  08/19/2018  [name] August 08, 1955 [id number]   Medication Adherence call to [name] patient is at a nursing home at this time and they are providing with her medication. [name] is showing past due on Lisinopril 40 mg under [place].   [place] Management Direct Dial [phone]  Fax [phone] [email or web address]

## 2018-09-07 ENCOUNTER — Encounter (INDEPENDENT_AMBULATORY_CARE_PROVIDER_SITE_OTHER): Payer: Medicare Other | Admitting: Ophthalmology

## 2018-09-21 ENCOUNTER — Other Ambulatory Visit: Payer: Self-pay

## 2018-09-21 ENCOUNTER — Non-Acute Institutional Stay: Payer: Medicare Other | Admitting: Adult Health Nurse Practitioner

## 2018-09-21 DIAGNOSIS — Z515 Encounter for palliative care: Secondary | ICD-10-CM

## 2018-09-21 NOTE — Progress Notes (Addendum)
[facility] Palliative Care Consult Note Telephone: [phone]  Fax: [phone]  PATIENT NAME: [name] DOB: 1956/04/07 MRN: [id number]  PRIMARY CARE PROVIDER:   Duffy Bruce, Manya Silvas, MD  REFERRING PROVIDER:  Duffy Bruce, Manya Silvas, MD [place], [place] [address]  RESPONSIBLE PARTY:   Jannifer Hick Williams(neice) [phone]  Due to the COVID-19 crisis, this visit was done via telemedicine and it was initiated and consent by this patient and or family. Video-audio (telehealth) contact was unable to be done due to technical barriers from the patient's side. Information given by [facility], ADON at the facility.    RECOMMENDATIONS and PLAN:  1.  Phantom limb pain.  Patient only asks for pain medication oncer every few weeks. Continue current pain management. Continue to monitor  2. Mobility.  Patient mostly spends her time in bed or in her room.  Has to be encouraged to get out of bed. Continue encouraging patient to be more active.  3.  Depression. Staff does report that she can have some depression.  She is being seen by psych services for this.  Continue recommendations by psych.  3. Advanced care planning: Patient remains full code.  Patient overall doing well and is stable at this time.  I spent 30 minutes providing this consultation,  from 10:00 to 10:30. More than 50% of the time in this consultation was spent coordinating communication.   HISTORY OF PRESENT ILLNESS:  [name] is a 63 y.o. year old female with multiple medical problems including bilateral BKA, DMT2, HTN, HLD, history of CVA. Palliative Care was asked to help address goals of care.   CODE STATUS: Full Code  PPS: 40% HOSPICE ELIGIBILITY/DIAGNOSIS: TBD  PAST MEDICAL HISTORY:  Past Medical History:  Diagnosis Date  . Atypical chest pain    a. non obstructive by cath in 2008 and 2010;  b. 02/2012 Myoview: non-ischemic, EF 57%  .  Cellulitis    a. 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 Constipation   . CVA (cerebral infarction)    a.  Small right parietal noted incidentally 04/2007;  b. right sided embolic CVA 06/2023;  c. 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 Diabetes mellitus, type II ([place])   . Diabetic retinopathy ([place])    NPDR w/edema OU  . Diaphragmatic hernia without mention of obstruction or gangrene   . Dry skin   . Esophageal reflux   . HTN (hypertension)   . 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 Hyperlipidemia   . Irritable bowel syndrome   . Morbid obesity ([place])   . Obesity, unspecified   . Pain in shoulder 06/28/2015  . Peripheral neuropathy   . Stroke [facility])    2013-'no residual'  . Syncope and collapse    a. near-syncopal episode in November 2008;  b. 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  . Tobacco abuse   . Vertigo     SOCIAL HX:  Social History   Tobacco Use  . Smoking status: Current Every Day Smoker    Packs/day: 0.25    Years: 40.00    Pack years: 10.00    Types: Cigarettes  . Smokeless tobacco: Never Used  Substance Use Topics  . Alcohol use: Yes    Alcohol/week: 4.0 standard drinks    Types: 4 Cans of beer per week  Comment: occ- foot ball season- 4 beers a week during football season.    ALLERGIES:  Allergies  Allergen Reactions  . Banana Swelling and Other (See Comments)    Facial swelling  . Penicillins Itching, Swelling and Rash    Face swells and break out  . Strawberry Extract Swelling and Other (See Comments)    Facial swelling     PERTINENT MEDICATIONS:  Outpatient Encounter Medications as of 09/21/2018  Medication Sig  . acetaminophen (TYLENOL) 325 MG tablet Take 650 mg by mouth every 4 (four) hours as needed for mild pain, moderate pain, fever or headache.  Marland Kitchen amLODipine (NORVASC) 2.5 MG tablet Take 2.5 mg by mouth daily.  Marland Kitchen aspirin 81 MG tablet Take 81 mg by mouth  daily.  Marland Kitchen atorvastatin (LIPITOR) 20 MG tablet Take 20 mg by mouth daily.  . carvedilol (COREG) 12.5 MG tablet Take 1 tablet by mouth 2 (two) times daily.  . cetirizine (ZYRTEC) 10 MG tablet Take 10 mg by mouth daily.  . cromolyn (OPTICROM) 4 % ophthalmic solution Place 1 drop into both eyes 4 (four) times daily.  . cyanocobalamin 1000 MCG tablet Take 1,000 mcg by mouth daily.  Marland Kitchen docusate sodium 100 MG CAPS Take 100 mg by mouth 2 (two) times daily.  . ferrous sulfate 325 (65 FE) MG tablet Take 325 mg by mouth daily with breakfast.  . gabapentin (NEURONTIN) 400 MG capsule Take 1 capsule by mouth daily.  . hydrALAZINE (APRESOLINE) 50 MG tablet Take 50 mg by mouth 4 (four) times daily -  before meals and at bedtime.  Marland Kitchen HYDROcodone-acetaminophen (NORCO/VICODIN) 5-325 MG tablet Take 1 tablet by mouth 3 (three) times daily as needed for moderate pain.  Marland Kitchen insulin aspart (NOVOLOG) 100 UNIT/ML injection Inject 3 Units into the skin 3 (three) times daily before meals. Hold If BS >100  . JANUVIA 50 MG tablet Take 50 mg by mouth daily.  Marland Kitchen LEVEMIR 100 UNIT/ML injection Inject 48 Units into the skin at bedtime.  Marland Kitchen lisinopril (PRINIVIL,ZESTRIL) 20 MG tablet Take 1 tablet (20 mg total) by mouth daily.  Marland Kitchen loperamide (IMODIUM A-D) 2 MG tablet Take 2 mg by mouth every 4 (four) hours as needed for diarrhea or loose stools.  Marland Kitchen PAZEO 0.7 % SOLN   . polyethylene glycol (MIRALAX / GLYCOLAX) packet Take 17 g by mouth daily as needed for moderate constipation.  . sertraline (ZOLOFT) 25 MG tablet Take 25 mg by mouth daily.  . vitamin C (ASCORBIC ACID) 500 MG tablet Take 500 mg by mouth daily.   Facility-Administered Encounter Medications as of 09/21/2018  Medication  . aflibercept (EYLEA) SOLN 2 mg  . Bevacizumab (AVASTIN) SOLN 1.25 mg  . Bevacizumab (AVASTIN) SOLN 1.25 mg  . Bevacizumab (AVASTIN) SOLN 1.25 mg  . Bevacizumab (AVASTIN) SOLN 1.25 mg     Berneta Sages, NP

## 2018-12-15 ENCOUNTER — Telehealth: Payer: Self-pay | Admitting: Gastroenterology

## 2018-12-15 NOTE — Telephone Encounter (Signed)
DOD 12/15/2018 Dr. Bryan Lemma,  This pt was referred for positive hemoccult stool by [place].  Pt's records will be sent to you for review.  Please advise scheduling.

## 2019-01-14 ENCOUNTER — Telehealth: Payer: Self-pay

## 2019-01-14 ENCOUNTER — Telehealth (INDEPENDENT_AMBULATORY_CARE_PROVIDER_SITE_OTHER): Payer: Medicare Other | Admitting: Gastroenterology

## 2019-01-14 ENCOUNTER — Other Ambulatory Visit: Payer: Self-pay

## 2019-01-14 VITALS — Ht 64.0 in | Wt 261.0 lb

## 2019-01-14 DIAGNOSIS — D649 Anemia, unspecified: Secondary | ICD-10-CM

## 2019-01-14 DIAGNOSIS — R195 Other fecal abnormalities: Secondary | ICD-10-CM | POA: Diagnosis not present

## 2019-01-14 DIAGNOSIS — Z6841 Body Mass Index (BMI) 40.0 and over, adult: Secondary | ICD-10-CM | POA: Diagnosis not present

## 2019-01-14 NOTE — Progress Notes (Signed)
Chief Complaint: FOBT positive stools  Referring Provider:     [facility]   HPI:    Due to current restrictions/limitations of in-office visits due to the COVID-19 pandemic, this scheduled clinical appointment was converted to a telehealth virtual consultation using Doximity. Could not establish good connection on patient's end, so converted to telephone.  -Time of medical discussion: 20 minutes -The patient did consent to this virtual visit and is aware of possible charges through their insurance for this visit.  -Names of all parties present: [name] (patient), Gerrit Heck, DO, [facility] (physician) -Patient location: Rehab facility -Physician location: Office  [name] is a 63 y.o. female with a history of CKD 3, morbid obesity, bilateral BKA, diabetes with neuropathy, hypertension, CAD, mood disorder, CVA, PVD, chronic anemia, currently in nursing facility at [facility] referred to the Gastroenterology Clinic for evaluation of FOBT positive stool.   No overt GI blood loss. Good PO intake, weight stable.  Otherwise, she is without any GI symptoms.  No nausea, vomiting, diarrhea, constipation, fever, chills, abdominal pain.  Per Hematology notes, she had a colonoscopy in 05/2007 with a tubular adenoma and HP.  She does not recall any of those results.  Most recent labs from 10/2018: Normal BMP (BUN/creatinine 20/0.89), A1c 5.1%.  CBC in 05/2018: H/H9 0.2/30.6, MCV 85, WBC 14.5, PLT 317.  Ferritin 160.  Normal B12, folate. Follows with Dr. Walden Field (Hematology), last seen in 05/2018.  Does receive ophthalmic Avastin which may contribute to her anemia.  CT in 05/2018 with hepatomegaly, otherwise normal liver and no duct dilatation.  Normal LAE's.  Past medical history, past surgical history, social history, family history, medications, and allergies reviewed in the chart and with patient.    Past Medical History:  Diagnosis Date   . Atypical chest pain    a. non obstructive by cath in 2008 and 2010;  b. 02/2012 Myoview: non-ischemic, EF 57%  . Cellulitis    a. 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 Constipation   . CVA (cerebral infarction)    a.  Small right parietal noted incidentally 04/2007;  b. right sided embolic CVA 123456;  c. 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 Diabetes mellitus, type II ([place])   . Diabetic retinopathy ([place])    NPDR w/edema OU  . Diaphragmatic hernia without mention of obstruction or gangrene   . Dry skin   . Esophageal reflux   . HTN (hypertension)   . 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 Hyperlipidemia   . Irritable bowel syndrome   . Morbid obesity ([place])   . Obesity, unspecified   . Pain in shoulder 06/28/2015  . Peripheral neuropathy   . Stroke [facility])    2013-'no residual'  . Syncope and collapse    a. near-syncopal episode in November 2008;  b. 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  . Tobacco abuse   . Vertigo      Past Surgical History:  Procedure Laterality Date  . AMPUTATION  12/30/2011   Procedure: AMPUTATION RAY;  Surgeon: Wylene Simmer, MD;  Location: [facility];  Service: Orthopedics;  Laterality: Left;  LEFT FIRST RAY AMPUTATION  . AMPUTATION  01/13/2012   Procedure: AMPUTATION BELOW KNEE;  Surgeon: Wylene Simmer, MD;  Location: [place];  Service: Orthopedics;  Laterality: Left;  . AMPUTATION  03/04/2012   Procedure: AMPUTATION BELOW KNEE;  Surgeon: Wylene Simmer, MD;  Location: [facility];  Service: Orthopedics;  Laterality: Left;  Revision of Left Below Knee Amputation  . AMPUTATION Right 11/02/2013   Procedure: AMPUTATION BELOW KNEE;  Surgeon: Newt Minion, MD;  Location: [place];  Service: Orthopedics;  Laterality: Right;  Right Below Knee Amputation  . CARDIAC CATHETERIZATION     LAD 30%, circumflex 50%, OM 75%, RI 60% with small branch 80%, dominant RCA 60%, EF 45-50%  . CHOLECYSTECTOMY     . COLONOSCOPY    . EYE SURGERY Right    cataract  . I&D EXTREMITY  12/23/2011   Procedure: IRRIGATION AND DEBRIDEMENT EXTREMITY;  Surgeon: Augustin Schooling, MD;  Location: [facility];  Service: Orthopedics;  Laterality: Left;  Left Foot  . I&D EXTREMITY  12/26/2011   Procedure: IRRIGATION AND DEBRIDEMENT EXTREMITY;  Surgeon: Wylene Simmer, MD;  Location: [facility];  Service: Orthopedics;  Laterality: Left;  LEFT FOOT I&D WITH POSSIBLE WOUND VAC APPLICATION, POSSIBLE LEFT FIRST RAY AMPUTATION  . I&D EXTREMITY Right 10/29/2013   Procedure: IRRIGATION AND DEBRIDEMENT EXTREMITY;  Surgeon: Johnn Hai, MD;  Location: [facility];  Service: Orthopedics;  Laterality: Right;  . Invasive Electrophysiologic Study  5/09   followed by insertion of an implantable loop recorder. S/p removal   . Multiple Toe Surgeries    . TEE WITHOUT CARDIOVERSION N/A 07/07/2012   Procedure: TRANSESOPHAGEAL ECHOCARDIOGRAM (TEE);  Surgeon: Lelon Perla, MD;  Location[facility];  Service: Cardiovascular;  Laterality: N/A;   Family History  Problem Relation Age of Onset  . Pneumonia Mother   . Gallbladder disease Mother        cancer  . Heart failure Mother   . Diabetes Father   . Coronary artery disease Father   . Stroke Neg Hx    Social History   Tobacco Use  . Smoking status: Current Every Day Smoker    Packs/day: 0.25    Years: 40.00    Pack years: 10.00    Types: Cigarettes  . Smokeless tobacco: Never Used  Substance Use Topics  . 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  . Drug use: No   Current Outpatient Medications  Medication Sig Dispense Refill  . amLODipine (NORVASC) 2.5 MG tablet Take 2.5 mg by mouth daily.    Marland Kitchen aspirin 81 MG tablet Take 81 mg by mouth daily.    Marland Kitchen atorvastatin (LIPITOR) 20 MG tablet Take 20 mg by mouth daily.    . carvedilol (COREG) 12.5 MG tablet Take 1 tablet by  mouth 2 (two) times daily.    . cetirizine (ZYRTEC) 10 MG tablet Take 10 mg by mouth daily.    . cyanocobalamin 1000 MCG tablet Take 1,000 mcg by mouth daily.    Marland Kitchen docusate sodium 100 MG CAPS Take 100 mg by mouth 2 (two) times daily. 10 capsule 0  . ferrous sulfate 325 (65 FE) MG tablet Take 325 mg by mouth daily with breakfast.    . gabapentin (NEURONTIN) 400 MG capsule Take 1 capsule by mouth daily.    . hydrALAZINE (APRESOLINE) 50 MG tablet Take 75 mg by mouth 3 (three) times daily.     Marland Kitchen HYDROcodone-acetaminophen (NORCO/VICODIN) 5-325 MG tablet Take 1 tablet by  mouth 3 (three) times daily as needed for moderate pain.    Marland Kitchen insulin aspart (NOVOLOG) 100 UNIT/ML injection Inject 3 Units into the skin 3 (three) times daily before meals. Hold If BS >100    . JANUVIA 50 MG tablet Take 50 mg by mouth daily.    Marland Kitchen LEVEMIR 100 UNIT/ML injection Inject 48 Units into the skin at bedtime.    Marland Kitchen lisinopril (PRINIVIL,ZESTRIL) 20 MG tablet Take 1 tablet (20 mg total) by mouth daily. 30 tablet 1  . loperamide (IMODIUM A-D) 2 MG tablet Take 2 mg by mouth every 4 (four) hours as needed for diarrhea or loose stools.    Marland Kitchen PAZEO 0.7 % SOLN     . polyethylene glycol (MIRALAX / GLYCOLAX) packet Take 17 g by mouth daily as needed for moderate constipation. 14 each 0  . vitamin C (ASCORBIC ACID) 500 MG tablet Take 500 mg by mouth daily.     Current Facility-Administered Medications  Medication Dose Route Frequency Provider Last Rate Last Dose  . aflibercept (EYLEA) SOLN 2 mg  2 mg Intravitreal  Bernarda Caffey, MD   2 mg at 03/19/18 1621  . Bevacizumab (AVASTIN) SOLN 1.25 mg  1.25 mg Intravitreal  Bernarda Caffey, MD   1.25 mg at 11/01/17 2300  . Bevacizumab (AVASTIN) SOLN 1.25 mg  1.25 mg Intravitreal  Bernarda Caffey, MD   1.25 mg at 12/01/17 1437  . Bevacizumab (AVASTIN) SOLN 1.25 mg  1.25 mg Intravitreal  Bernarda Caffey, MD   1.25 mg at 12/31/17 2354  . Bevacizumab (AVASTIN) SOLN 1.25 mg  1.25 mg Intravitreal  Bernarda Caffey, MD   1.25 mg at 01/27/18 1508   Allergies  Allergen Reactions  . Banana Swelling and Other (See Comments)    Facial swelling  . Penicillins Itching, Swelling and Rash    Face swells and break out  . Strawberry Extract Swelling and Other (See Comments)    Facial swelling     Review of Systems: All systems reviewed and negative except where noted in HPI.     Physical Exam:    Complete physical exam not completed due to the nature of this telehealth communication.   Gen: Awake, alert, and oriented, and well communicative. Psych: Pleasant, cooperative, normal speech, thought processing seemingly intact   ASSESSMENT AND PLAN;   1) FOBT positive stool 2) Normocytic anemia 3) Obesity 4) Diabetes 5) CAD  - Schedule colonoscopy to be completed at [facility] - Bowel prep to be completed at [facility] - Normal iron indices and no upper GI symptoms to suggest doing EGD as well -Okay to resume ASA 81 mg -Hold iron 7 days prior - Continue to follow-up with Hematology re: normocytic anemia  The indications, risks, and benefits of colonoscopy were explained to the patient in detail. Risks include but are not limited to bleeding, perforation, adverse reaction to medications, and cardiopulmonary compromise. Sequelae include but are not limited to the possibility of surgery, hospitalization, and mortality. The patient verbalized understanding and wished to proceed. All questions answered, referred to the scheduler and bowel prep ordered. Further recommendations pending results of the exam.     Lavena Bullion, DO, FACG  01/14/2019, 12:03 PM   Deeann Saint *

## 2019-01-19 ENCOUNTER — Other Ambulatory Visit: Payer: Self-pay

## 2019-01-19 ENCOUNTER — Telehealth: Payer: Self-pay

## 2019-01-19 NOTE — Telephone Encounter (Signed)
Notification received that patient cannot be screened at an outside facility-patient must be CVOID tested (screeened) on the morning of procedure at [facility];  Attempted to reach supervisor or nurse at facility where patient is currently residing and unable to leave a message or speak with nurse;  Will attempt to reach supervisor or nurse at a later date/time;

## 2019-01-19 NOTE — Telephone Encounter (Signed)
called and spoke with RN at [facility] and [facility] is requesting to know if the facility can COVID screen this patient and keep her quarantined until the patient's EGD;  Phone call has been placed to check on this and RN informed [facility] the facility will be notified if this can occur instead of the patient coming to [facility] to be screened prior to EGD;

## 2019-02-02 ENCOUNTER — Telehealth: Payer: Self-pay | Admitting: Gastroenterology

## 2019-02-02 NOTE — Telephone Encounter (Signed)
Ival Bible from [facility] called to confirm time of EGD scheduled at [facility].  I do not see it in Epic.

## 2019-02-03 NOTE — Telephone Encounter (Signed)
Attempted to reach patient -tried home and mobile numbers-no answer;will try to reach patient at a later date/time;

## 2019-02-03 NOTE — Telephone Encounter (Signed)
Attempted to reach [name]-no answer-unable to leave a VM-will try to reach at a later date/time;

## 2019-02-07 ENCOUNTER — Telehealth: Payer: Self-pay | Admitting: Hematology

## 2019-02-07 NOTE — Telephone Encounter (Signed)
Received a call from [name] to re-establish and follow up with a hematologist for thrombocytopenia. I informed the nurse that Dr. Walden Field is no longer here and will be re-established with a new hematologist. [name] has been scheduled to see Dr. Irene Limbo on 10/20 at [facility] [facility] to have the pt arrive 15 minutes early.

## 2019-02-10 ENCOUNTER — Other Ambulatory Visit: Payer: Self-pay

## 2019-02-10 DIAGNOSIS — D649 Anemia, unspecified: Secondary | ICD-10-CM

## 2019-02-10 DIAGNOSIS — R195 Other fecal abnormalities: Secondary | ICD-10-CM

## 2019-02-10 DIAGNOSIS — K219 Gastro-esophageal reflux disease without esophagitis: Secondary | ICD-10-CM

## 2019-02-10 MED ORDER — PEG 3350-KCL-NA BICARB-NACL 420 G PO SOLR: 4000.0000 mL | Freq: Once | ORAL | 0 refills | Status: DC

## 2019-02-10 NOTE — Addendum Note (Signed)
Addended by: Mohammed Kindle on: 02/10/2019 12:08 PM   Modules accepted: Orders, SmartSet

## 2019-02-10 NOTE — Addendum Note (Signed)
Addended by: Mohammed Kindle on: 02/10/2019 12:31 PM   Modules accepted: Orders

## 2019-02-10 NOTE — Telephone Encounter (Signed)
Called and spoke with Alyse Low, RN at [facility] the patient has been scheduled for her EGD/colon at [facility] on 03/29/2019 arrival at 8:30 am/9:00am for pre procedure COVID screening; patient instructions have been faxed to [phone] attention Christy/Melissa at [facility] per their request; Alyse Low verbalized understanding of information/instructions; Christy as also advised to call back to the office should questions/concerns arise;

## 2019-02-11 ENCOUNTER — Other Ambulatory Visit: Payer: Self-pay

## 2019-02-11 ENCOUNTER — Non-Acute Institutional Stay: Payer: Medicare Other | Admitting: Hospice

## 2019-02-11 DIAGNOSIS — Z515 Encounter for palliative care: Secondary | ICD-10-CM

## 2019-02-11 NOTE — Progress Notes (Signed)
[facility] Consult Note Telephone: [phone]  Fax: [phone]  PATIENT NAME: [name] DOB: Feb 12, 1956 MRN: [id number]  PRIMARY CARE PROVIDER:   Duffy Bruce, Manya Silvas, MD  REFERRING PROVIDER:  Duffy Bruce, Manya Silvas, MD [place],  [place] [address]    RESPONSIBLE PARTYJannifer Hick Williams(neice) [phone]    RECOMMENDATIONS/PLAN:  1. Advance Care Planning/Goals of Care: Goals include to maximize quality of life and symptom management. Discussed advance care planning and goals of care. She affirmed she is a Full code and would want full scope of treatment. MOST form up to date in the chart. Will upload to Epic. 2. Symptom management: She denied pain - phantom limb pain-  and said when she needs her pain medications, they are effective in managing her pain. Nursing with no complaints; reports patient is fairly stable; has a scheduled Colonoscopy for next month.   3. Follow up Palliative Care Visit: Palliative care will continue to follow for goals of care clarification.   I spent 25 minutes providing this consultation, from 12.00pm pm to 12.25pm  More than 50% of the time in this consultation was spent on coordinating advance care communication,  chart review, and communication with nursing staff.  HISTORY OF PRESENT ILLNESS:  [name] is a 63 y.o. year old female with multiple medical problems including bilateral BKA, DMT2, HTN, HLD, history of CVA. Palliative Care was asked to help address goals of care.   CODE STATUS: FULL  PPS: 40% HOSPICE ELIGIBILITY/DIAGNOSIS: TBD  PAST MEDICAL HISTORY:  Past Medical History:  Diagnosis Date  . Atypical chest pain    a. non obstructive by cath in 2008 and 2010;  b. 02/2012 Myoview: non-ischemic, EF 57%  . Cellulitis    a. 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 Constipation   . CVA (cerebral infarction)    a.  Small right parietal noted incidentally 04/2007;   b. right sided embolic CVA 123456;  c. 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 Diabetes mellitus, type II ([place])   . Diabetic retinopathy ([place])    NPDR w/edema OU  . Diaphragmatic hernia without mention of obstruction or gangrene   . Dry skin   . Esophageal reflux   . HTN (hypertension)   . 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 Hyperlipidemia   . Irritable bowel syndrome   . Morbid obesity ([place])   . Obesity, unspecified   . Pain in shoulder 06/28/2015  . Peripheral neuropathy   . Stroke [facility])    2013-'no residual'  . Syncope and collapse    a. near-syncopal episode in November 2008;  b. 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  . Tobacco abuse   . Vertigo     SOCIAL HX:  Social History   Tobacco Use  . Smoking status: Current Every Day Smoker    Packs/day: 0.25    Years: 40.00    Pack years: 10.00    Types: Cigarettes  . Smokeless tobacco: Never Used  Substance Use Topics  . 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    ALLERGIES:  Allergies  Allergen Reactions  . Banana Swelling and  Other (See Comments)    Facial swelling  . Penicillins Itching, Swelling and Rash    Face swells and break out  . Strawberry Extract Swelling and Other (See Comments)    Facial swelling     PERTINENT MEDICATIONS:  Outpatient Encounter Medications as of 02/11/2019  Medication Sig  . amLODipine (NORVASC) 2.5 MG tablet Take 2.5 mg by mouth daily.  Marland Kitchen aspirin 81 MG tablet Take 81 mg by mouth daily.  Marland Kitchen atorvastatin (LIPITOR) 20 MG tablet Take 20 mg by mouth daily.  . carvedilol (COREG) 12.5 MG tablet Take 1 tablet by mouth 2 (two) times daily.  . cetirizine (ZYRTEC) 10 MG tablet Take 10 mg by mouth daily.  . cyanocobalamin 1000 MCG tablet Take 1,000 mcg by mouth daily.  Marland Kitchen  docusate sodium 100 MG CAPS Take 100 mg by mouth 2 (two) times daily.  . ferrous sulfate 325 (65 FE) MG tablet Take 325 mg by mouth daily with breakfast.  . gabapentin (NEURONTIN) 400 MG capsule Take 1 capsule by mouth daily.  . hydrALAZINE (APRESOLINE) 50 MG tablet Take 75 mg by mouth 3 (three) times daily.   Marland Kitchen HYDROcodone-acetaminophen (NORCO/VICODIN) 5-325 MG tablet Take 1 tablet by mouth 3 (three) times daily as needed for moderate pain.  Marland Kitchen insulin aspart (NOVOLOG) 100 UNIT/ML injection Inject 3 Units into the skin 3 (three) times daily before meals. Hold If BS >100  . JANUVIA 50 MG tablet Take 50 mg by mouth daily.  Marland Kitchen LEVEMIR 100 UNIT/ML injection Inject 48 Units into the skin at bedtime.  Marland Kitchen lisinopril (PRINIVIL,ZESTRIL) 20 MG tablet Take 1 tablet (20 mg total) by mouth daily.  Marland Kitchen loperamide (IMODIUM A-D) 2 MG tablet Take 2 mg by mouth every 4 (four) hours as needed for diarrhea or loose stools.  Marland Kitchen PAZEO 0.7 % SOLN   . polyethylene glycol (MIRALAX / GLYCOLAX) packet Take 17 g by mouth daily as needed for moderate constipation.  . vitamin C (ASCORBIC ACID) 500 MG tablet Take 500 mg by mouth daily.   Facility-Administered Encounter Medications as of 02/11/2019  Medication  . aflibercept (EYLEA) SOLN 2 mg  . Bevacizumab (AVASTIN) SOLN 1.25 mg  . Bevacizumab (AVASTIN) SOLN 1.25 mg  . Bevacizumab (AVASTIN) SOLN 1.25 mg  . Bevacizumab (AVASTIN) SOLN 1.25 mg    PHYSICAL EXAM/ROS:   General: NAD Cardiovascular: denied chest pain/discomfort Pulmonary: Denied cough, no SOB Extremities: BKA Skin: no rashes/wounds on exposed skin Neurological: Alert and oriented x 4  Teodoro Spray, NP

## 2019-02-18 ENCOUNTER — Non-Acute Institutional Stay: Payer: Medicare Other | Admitting: Hospice

## 2019-02-18 ENCOUNTER — Other Ambulatory Visit: Payer: Self-pay

## 2019-02-18 DIAGNOSIS — Z515 Encounter for palliative care: Secondary | ICD-10-CM

## 2019-02-18 NOTE — Progress Notes (Signed)
[place] Consult Note Telephone: [phone]  Fax: [phone]  PATIENT NAME: [name] DOB: 10-10-1955 MRN: [id number]  PRIMARY CARE PROVIDER:   Duffy Bruce, Manya Silvas, MD  REFERRING PROVIDER:  Duffy Bruce, Manya Silvas, MD [place],   [id number]  RESPONSIBLE PARTY:   Self, Anna Genre [phone]  RECOMMENDATIONS/PLAN:  1. Advance Care Planning/Goals of Care: Goals include to maximize quality of life and symptom management. Old MOST form in chart was not signed by a provider.  Discussed advance care planning and goals of care. She affirmed she is a Full code and would want full scope of treatment. New MOST form signed today. Will upload to Epic. 2. Symptom management: She denied pain - phantom limb pain- during visit.  Nursing with no complaints. 3. Follow up Palliative Care Visit: Palliative care will continue to follow for goals of care clarification.   I spent 25 minutes providing this consultation, from 10.00am pm to 10.25am.  More than 50% of the time in this consultation was spent on coordinating advance care communication,  chart review and communication with clinical staff.  HISTORY OF PRESENT ILLNESS:Tenessa L Watsonis a 63 y.o.year oldfemalewith multiple medical problems including bilateral BKA, DMT2, HTN, HLD, history of CVA. Palliative Care was asked to help address goals of care.   CODE STATUS: FULL  PPS: 40%PAST MEDICAL HISTORY:  Past Medical History:  Diagnosis Date  . Atypical chest pain    a. non obstructive by cath in 2008 and 2010;  b. 02/2012 Myoview: non-ischemic, EF 57%  . Cellulitis    a. 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 Constipation   . CVA (cerebral infarction)    a.  Small right parietal noted incidentally 04/2007;  b. right sided embolic CVA 123456;  c. 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 Diabetes mellitus, type II ([place])   . Diabetic retinopathy ([place])    NPDR w/edema OU  . Diaphragmatic hernia without mention of obstruction or gangrene   . Dry skin   . Esophageal reflux   . HTN (hypertension)   . 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 Hyperlipidemia   . Irritable bowel syndrome   . Morbid obesity ([place])   . Obesity, unspecified   . Pain in shoulder 06/28/2015  . Peripheral neuropathy   . Stroke [facility])    2013-'no residual'  . Syncope and collapse    a. near-syncopal episode in November 2008;  b. 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  . Tobacco abuse   . Vertigo     SOCIAL HX:  Social History   Tobacco Use  . Smoking status: Current Every Day Smoker    Packs/day: 0.25    Years: 40.00    Pack years: 10.00    Types: Cigarettes  . Smokeless tobacco: Never Used  Substance Use Topics  . 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    ALLERGIES:  Allergies  Allergen Reactions  . Banana Swelling and Other (See Comments)    Facial swelling  . Penicillins Itching, Swelling and Rash    Face swells and break out  . Strawberry Extract Swelling and Other (See  Comments)    Facial swelling     PERTINENT MEDICATIONS:  Outpatient Encounter Medications as of 02/18/2019  Medication Sig  . amLODipine (NORVASC) 2.5 MG tablet Take 2.5 mg by mouth daily.  Marland Kitchen aspirin 81 MG tablet Take 81 mg by mouth daily.  Marland Kitchen atorvastatin (LIPITOR) 20 MG tablet Take 20 mg by mouth daily.  . carvedilol (COREG) 12.5 MG tablet Take 1 tablet by mouth 2 (two) times daily.  . cetirizine (ZYRTEC) 10 MG tablet Take 10 mg by mouth daily.  . cyanocobalamin 1000 MCG tablet Take 1,000 mcg by mouth daily.  Marland Kitchen docusate sodium 100 MG CAPS Take 100 mg by mouth 2 (two) times daily.  . ferrous sulfate 325 (65 FE) MG tablet Take 325 mg by mouth daily with breakfast.   . gabapentin (NEURONTIN) 400 MG capsule Take 1 capsule by mouth daily.  . hydrALAZINE (APRESOLINE) 50 MG tablet Take 75 mg by mouth 3 (three) times daily.   Marland Kitchen HYDROcodone-acetaminophen (NORCO/VICODIN) 5-325 MG tablet Take 1 tablet by mouth 3 (three) times daily as needed for moderate pain.  Marland Kitchen insulin aspart (NOVOLOG) 100 UNIT/ML injection Inject 3 Units into the skin 3 (three) times daily before meals. Hold If BS >100  . JANUVIA 50 MG tablet Take 50 mg by mouth daily.  Marland Kitchen LEVEMIR 100 UNIT/ML injection Inject 48 Units into the skin at bedtime.  Marland Kitchen lisinopril (PRINIVIL,ZESTRIL) 20 MG tablet Take 1 tablet (20 mg total) by mouth daily.  Marland Kitchen loperamide (IMODIUM A-D) 2 MG tablet Take 2 mg by mouth every 4 (four) hours as needed for diarrhea or loose stools.  Marland Kitchen PAZEO 0.7 % SOLN   . polyethylene glycol (MIRALAX / GLYCOLAX) packet Take 17 g by mouth daily as needed for moderate constipation.  . vitamin C (ASCORBIC ACID) 500 MG tablet Take 500 mg by mouth daily.   Facility-Administered Encounter Medications as of 02/18/2019  Medication  . aflibercept (EYLEA) SOLN 2 mg  . Bevacizumab (AVASTIN) SOLN 1.25 mg  . Bevacizumab (AVASTIN) SOLN 1.25 mg  . Bevacizumab (AVASTIN) SOLN 1.25 mg  . Bevacizumab (AVASTIN) SOLN 1.25 mg    PHYSICAL EXAM/ROS:   General: NAD Cardiovascular: denied chest pain/discomfort Pulmonary: Denied cough, no SOB Extremities: BKA Skin: no rashes/wounds on exposed skin Neurological: Alert and oriented x 4  Teodoro Spray, NP

## 2019-02-28 ENCOUNTER — Encounter (HOSPITAL_COMMUNITY): Payer: Self-pay

## 2019-02-28 ENCOUNTER — Ambulatory Visit (HOSPITAL_COMMUNITY): Admit: 2019-02-28 | Payer: Medicare Other | Admitting: Gastroenterology

## 2019-02-28 SURGERY — ESOPHAGOGASTRODUODENOSCOPY (EGD) WITH PROPOFOL
Anesthesia: Monitor Anesthesia Care

## 2019-03-08 ENCOUNTER — Inpatient Hospital Stay: Payer: Medicare Other | Admitting: Hematology

## 2019-03-15 ENCOUNTER — Telehealth: Payer: Self-pay | Admitting: Hematology

## 2019-03-15 ENCOUNTER — Other Ambulatory Visit: Payer: Self-pay | Admitting: Internal Medicine

## 2019-03-15 DIAGNOSIS — Z1231 Encounter for screening mammogram for malignant neoplasm of breast: Secondary | ICD-10-CM

## 2019-03-15 NOTE — Telephone Encounter (Signed)
I attempted to call the pt's nurse at [facility] to reschedule [name]'s appt at [phone], but no one from the nurse's station picked up the phone.

## 2019-03-22 ENCOUNTER — Telehealth: Payer: Self-pay | Admitting: Hematology

## 2019-03-22 NOTE — Telephone Encounter (Signed)
Confirmed 11/17 new patient visit with Margreta Journey at [facility] [phone]).

## 2019-03-24 ENCOUNTER — Other Ambulatory Visit: Payer: Self-pay

## 2019-03-24 ENCOUNTER — Telehealth: Payer: Self-pay | Admitting: Gastroenterology

## 2019-03-24 DIAGNOSIS — D649 Anemia, unspecified: Secondary | ICD-10-CM

## 2019-03-24 DIAGNOSIS — K219 Gastro-esophageal reflux disease without esophagitis: Secondary | ICD-10-CM

## 2019-03-24 DIAGNOSIS — R195 Other fecal abnormalities: Secondary | ICD-10-CM

## 2019-03-24 MED ORDER — BISACODYL EC 5 MG PO TBEC: 5.0000 mg | DELAYED_RELEASE_TABLET | Freq: Once | ORAL | 0 refills | Status: DC

## 2019-03-24 MED ORDER — PEG 3350-KCL-NA BICARB-NACL 420 G PO SOLR: 4000.0000 mL | Freq: Once | ORAL | 0 refills | Status: DC

## 2019-03-24 MED ORDER — BISACODYL EC 5 MG PO TBEC: 5.0000 mg | DELAYED_RELEASE_TABLET | Freq: Once | ORAL | 0 refills | Status: AC

## 2019-03-24 MED ORDER — PEG 3350-KCL-NA BICARB-NACL 420 G PO SOLR: 4000.0000 mL | Freq: Once | ORAL | 0 refills | Status: AC

## 2019-03-24 NOTE — Progress Notes (Signed)
RX for prep faxed to [facility] [phone] per [facility] request; instructions also faxed for prep;

## 2019-03-24 NOTE — Telephone Encounter (Signed)
[name] with rehab place called requesting prescription for golytely and prep instructions.

## 2019-03-24 NOTE — Telephone Encounter (Signed)
Instructions and RX for prep meds faxed to [facility] at [phone] per [facility] request for patient's prep

## 2019-03-24 NOTE — Progress Notes (Signed)
Reprinted prescriptions to fax

## 2019-03-24 NOTE — Telephone Encounter (Signed)
Bri, is this something you are working on looks like you scheduled the patient.

## 2019-03-28 ENCOUNTER — Encounter (HOSPITAL_COMMUNITY): Payer: Self-pay | Admitting: *Deleted

## 2019-03-28 ENCOUNTER — Other Ambulatory Visit: Payer: Self-pay

## 2019-03-28 NOTE — Progress Notes (Signed)
Patient needs Rapid Covid test day of procedure 03/29/2019. Dr. Bryan Lemma personally spoke with Dr. Hulen Skains and received approval for Rapid test.  I have personally spoken with Rolla Flatten who is notifying all other persons necessary.

## 2019-03-28 NOTE — Progress Notes (Signed)
Instructions Faxed to [facility], LPN at [place] and instructed her to call me back to go over instructions.

## 2019-03-28 NOTE — Progress Notes (Addendum)
Preop instructions for:  [name]                       Date of Birth: 04-30-1956                          Date of Procedure:  Tuesday 03/29/2019       Doctor:Dr. Bryan Lemma  Time to arrive at [facility]: 900 am  Report to: Admitting   Procedure:Esophagogastroduodenoscopy and Colonoscopy with Propofol   [employer] to take patient to [facility] to get Covid test on 03/28/2019 and wait 30 minutes and then bring patient in at [place].  Do not eat or drink past midnight the night before your procedure.(To include any tube feedings-must be discontinued)    Take these morning medications only with sips of water.(or give through gastrostomy or feeding tube). Gabapentin (Neurontin), Hydralazine (Apresoline ), Carvediolol (Coreg), Amlodipine (Norvasc), use eye drops , use Albuterol inhaler if needed   The night before the procedure  (Monday 03/28/2019), Give Levemir Insulin  25 units at bedtime!   Note: No Insulin or Diabetic meds should be given or taken the morning of the procedure!   Facility contact:  Cheri Fowler, LPN                Phone:  [phone]                 Health Care POA: none  Transportation contact phone#: [employer] [phone]  Please send day of procedure:current med list and meds last taken that day, confirm nothing by mouth status from what time, Patient Demographic info( to include DNR status, problem list, allergies)   RN contact name/phone#:  Dellie Catholic                           and Fax #: [phone]  Bring Insurance card and picture ID Leave all jewelry and other valuables at place where living( no metal or rings to be worn) No contact lens Women-no make-up, no lotions,perfumes,powders Men-no colognes,lotions  Any questions day of procedure,call  [facility] [phone]   Sent from :[facility]  [place] by : Charmaine Downs. Jasson Siegmann,BSN,RN

## 2019-03-28 NOTE — Progress Notes (Signed)
Talked with Spoke with [name]. She said that [employer] would take the patient to be tested at 0830 at [place] and then wait with the patient til they are called in parking lot of [facility] then have the patient at the unit for her procedure when called.

## 2019-03-29 ENCOUNTER — Ambulatory Visit (HOSPITAL_COMMUNITY)
Admission: RE | Admit: 2019-03-29 | Discharge: 2019-03-29 | Disposition: A | Discharge status: Home / Self Care | Payer: Medicare Other | Attending: Gastroenterology | Admitting: Gastroenterology

## 2019-03-29 ENCOUNTER — Encounter (HOSPITAL_COMMUNITY): Payer: Self-pay | Admitting: Gastroenterology

## 2019-03-29 ENCOUNTER — Other Ambulatory Visit (HOSPITAL_COMMUNITY)
Admission: RE | Admit: 2019-03-29 | Discharge: 2019-03-29 | Disposition: A | Discharge status: Home / Self Care | Payer: Medicare Other | Source: Ambulatory Visit | Attending: Gastroenterology | Admitting: Gastroenterology

## 2019-03-29 ENCOUNTER — Encounter (HOSPITAL_COMMUNITY)
Admission: RE | Disposition: A | Discharge status: Home / Self Care | Payer: Self-pay | Source: Home / Self Care | Attending: Gastroenterology

## 2019-03-29 ENCOUNTER — Other Ambulatory Visit: Payer: Self-pay

## 2019-03-29 ENCOUNTER — Ambulatory Visit (HOSPITAL_COMMUNITY): Payer: Medicare Other | Admitting: Anesthesiology

## 2019-03-29 DIAGNOSIS — E1140 Type 2 diabetes mellitus with diabetic neuropathy, unspecified: Secondary | ICD-10-CM | POA: Diagnosis not present

## 2019-03-29 DIAGNOSIS — K589 Irritable bowel syndrome without diarrhea: Secondary | ICD-10-CM | POA: Diagnosis not present

## 2019-03-29 DIAGNOSIS — Z89511 Acquired absence of right leg below knee: Secondary | ICD-10-CM | POA: Insufficient documentation

## 2019-03-29 DIAGNOSIS — N183 Chronic kidney disease, stage 3 unspecified: Secondary | ICD-10-CM | POA: Diagnosis not present

## 2019-03-29 DIAGNOSIS — K2950 Unspecified chronic gastritis without bleeding: Secondary | ICD-10-CM | POA: Diagnosis not present

## 2019-03-29 DIAGNOSIS — E1151 Type 2 diabetes mellitus with diabetic peripheral angiopathy without gangrene: Secondary | ICD-10-CM | POA: Insufficient documentation

## 2019-03-29 DIAGNOSIS — Z20828 Contact with and (suspected) exposure to other viral communicable diseases: Secondary | ICD-10-CM | POA: Diagnosis not present

## 2019-03-29 DIAGNOSIS — D123 Benign neoplasm of transverse colon: Secondary | ICD-10-CM | POA: Diagnosis not present

## 2019-03-29 DIAGNOSIS — I129 Hypertensive chronic kidney disease with stage 1 through stage 4 chronic kidney disease, or unspecified chronic kidney disease: Secondary | ICD-10-CM | POA: Insufficient documentation

## 2019-03-29 DIAGNOSIS — E11319 Type 2 diabetes mellitus with unspecified diabetic retinopathy without macular edema: Secondary | ICD-10-CM | POA: Diagnosis not present

## 2019-03-29 DIAGNOSIS — K635 Polyp of colon: Secondary | ICD-10-CM | POA: Diagnosis not present

## 2019-03-29 DIAGNOSIS — E1122 Type 2 diabetes mellitus with diabetic chronic kidney disease: Secondary | ICD-10-CM | POA: Insufficient documentation

## 2019-03-29 DIAGNOSIS — K228 Other specified diseases of esophagus: Secondary | ICD-10-CM | POA: Insufficient documentation

## 2019-03-29 DIAGNOSIS — Z6841 Body Mass Index (BMI) 40.0 and over, adult: Secondary | ICD-10-CM | POA: Insufficient documentation

## 2019-03-29 DIAGNOSIS — R195 Other fecal abnormalities: Secondary | ICD-10-CM | POA: Diagnosis present

## 2019-03-29 DIAGNOSIS — Z8673 Personal history of transient ischemic attack (TIA), and cerebral infarction without residual deficits: Secondary | ICD-10-CM | POA: Insufficient documentation

## 2019-03-29 DIAGNOSIS — Z87891 Personal history of nicotine dependence: Secondary | ICD-10-CM | POA: Diagnosis not present

## 2019-03-29 DIAGNOSIS — D649 Anemia, unspecified: Secondary | ICD-10-CM

## 2019-03-29 DIAGNOSIS — K2990 Gastroduodenitis, unspecified, without bleeding: Secondary | ICD-10-CM

## 2019-03-29 DIAGNOSIS — D631 Anemia in chronic kidney disease: Secondary | ICD-10-CM | POA: Insufficient documentation

## 2019-03-29 DIAGNOSIS — K2970 Gastritis, unspecified, without bleeding: Secondary | ICD-10-CM

## 2019-03-29 DIAGNOSIS — K621 Rectal polyp: Secondary | ICD-10-CM | POA: Diagnosis not present

## 2019-03-29 DIAGNOSIS — K219 Gastro-esophageal reflux disease without esophagitis: Secondary | ICD-10-CM | POA: Insufficient documentation

## 2019-03-29 DIAGNOSIS — K2980 Duodenitis without bleeding: Secondary | ICD-10-CM | POA: Diagnosis not present

## 2019-03-29 DIAGNOSIS — Z89512 Acquired absence of left leg below knee: Secondary | ICD-10-CM | POA: Insufficient documentation

## 2019-03-29 DIAGNOSIS — I2510 Atherosclerotic heart disease of native coronary artery without angina pectoris: Secondary | ICD-10-CM | POA: Insufficient documentation

## 2019-03-29 HISTORY — PX: POLYPECTOMY: SHX5525

## 2019-03-29 HISTORY — PX: COLONOSCOPY WITH PROPOFOL: SHX5780

## 2019-03-29 HISTORY — DX: Chronic kidney disease, unspecified: N18.9

## 2019-03-29 HISTORY — PX: ESOPHAGOGASTRODUODENOSCOPY (EGD) WITH PROPOFOL: SHX5813

## 2019-03-29 HISTORY — PX: BIOPSY: SHX5522

## 2019-03-29 LAB — GLUCOSE, CAPILLARY: Glucose-Capillary: 149 mg/dL — ABNORMAL HIGH (ref 70–99)

## 2019-03-29 LAB — SARS CORONAVIRUS 2 BY RT PCR (HOSPITAL ORDER, PERFORMED IN ~~LOC~~ HOSPITAL LAB): SARS Coronavirus 2: NEGATIVE

## 2019-03-29 SURGERY — ESOPHAGOGASTRODUODENOSCOPY (EGD) WITH PROPOFOL
Anesthesia: Monitor Anesthesia Care

## 2019-03-29 MED ORDER — PROPOFOL 500 MG/50ML IV EMUL
INTRAVENOUS | Status: DC | PRN
  Administered 2019-03-29: 150 ug/kg/min via INTRAVENOUS

## 2019-03-29 MED ORDER — LIDOCAINE 2% (20 MG/ML) 5 ML SYRINGE
INTRAMUSCULAR | Status: DC | PRN
  Administered 2019-03-29: 100 mg via INTRAVENOUS

## 2019-03-29 MED ORDER — SODIUM CHLORIDE 0.9 % IV SOLN: INTRAVENOUS | Status: DC

## 2019-03-29 MED ORDER — LACTATED RINGERS IV SOLN
INTRAVENOUS | Status: DC | PRN
  Administered 2019-03-29: 11:00:00 via INTRAVENOUS

## 2019-03-29 MED ORDER — PROPOFOL 10 MG/ML IV BOLUS
INTRAVENOUS | Status: DC | PRN
  Administered 2019-03-29: 30 mg via INTRAVENOUS

## 2019-03-29 SURGICAL SUPPLY — 24 items

## 2019-03-29 NOTE — Anesthesia Preprocedure Evaluation (Signed)
Anesthesia Evaluation  Patient identified by MRN, date of birth, ID band Patient awake    Reviewed: Allergy & Precautions, NPO status , Patient's Chart, lab work & pertinent test results  Airway Mallampati: III  TM Distance: >3 FB Neck ROM: Full    Dental  (+) Poor Dentition,    Pulmonary former smoker,    breath sounds clear to auscultation       Cardiovascular hypertension, Pt. on home beta blockers and Pt. on medications + CAD   Rhythm:Regular Rate:Normal     Neuro/Psych Depression TIACVA    GI/Hepatic Neg liver ROS, GERD  ,  Endo/Other  diabetes, Type 2, Insulin Dependent, Oral Hypoglycemic Agents  Renal/GU Renal disease     Musculoskeletal negative musculoskeletal ROS (+)   Abdominal (+) + obese,   Peds  Hematology  (+) anemia ,   Anesthesia Other Findings   Reproductive/Obstetrics                             Anesthesia Physical Anesthesia Plan  ASA: III  Anesthesia Plan: MAC   Post-op Pain Management:    Induction: Intravenous  PONV Risk Score and Plan: 0 and Propofol infusion and Ondansetron  Airway Management Planned: Natural Airway and Simple Face Mask  Additional Equipment: None  Intra-op Plan:   Post-operative Plan:   Informed Consent: I have reviewed the patients History and Physical, chart, labs and discussed the procedure including the risks, benefits and alternatives for the proposed anesthesia with the patient or authorized representative who has indicated his/her understanding and acceptance.       Plan Discussed with: CRNA  Anesthesia Plan Comments:         Anesthesia Quick Evaluation

## 2019-03-29 NOTE — Interval H&P Note (Signed)
History and Physical Interval Note:  03/29/2019 11:01 AM  [name]  has presented today for surgery, with the diagnosis of GERD with esophagitis/heme + stools/normocytic anemia.  The various methods of treatment have been discussed with the patient and family. After consideration of risks, benefits and other options for treatment, the patient has consented to  Procedure(s): ESOPHAGOGASTRODUODENOSCOPY (EGD) WITH PROPOFOL (N/A) COLONOSCOPY WITH PROPOFOL (N/A) as a surgical intervention.  The patient's history has been reviewed, patient examined, no change in status, stable for surgery.  I have reviewed the patient's chart and labs.  Questions were answered to the patient's satisfaction.     [name]

## 2019-03-29 NOTE — Op Note (Signed)
[facility] Patient Name: [name] Procedure Date: 03/29/2019 MRN: [id number] Attending MD: Gerrit Heck , MD Date of Birth: [birth year] CSN: [id number] Age: 63 Admit Type: Outpatient Procedure:                Upper GI endoscopy Indications:              Esophageal reflux, Heme positive stool, Anemia Providers:                Gerrit Heck, MD, Grace Isaac, RN, Lina Sar, Technician, Caryl Pina CRNA Referring MD:              Medicines:                Monitored Anesthesia Care Complications:            No immediate complications. Estimated Blood Loss:     Estimated blood loss was minimal. Procedure:                Pre-Anesthesia Assessment:                           - Prior to the procedure, a History and Physical                            was performed, and patient medications and                            allergies were reviewed. The patient's tolerance of                            previous anesthesia was also reviewed. The risks                            and benefits of the procedure and the sedation                            options and risks were discussed with the patient.                            All questions were answered, and informed consent                            was obtained. Prior Anticoagulants: The patient has                            taken no previous anticoagulant or antiplatelet                            agents. ASA Grade Assessment: III - A patient with                            severe systemic disease. After reviewing the risks  and benefits, the patient was deemed in                            satisfactory condition to undergo the procedure.                           After obtaining informed consent, the endoscope was                            passed under direct vision. Throughout the                            procedure, the patient's blood pressure, pulse,  and                            oxygen saturations were monitored continuously. The                            [id number] Olympus gastroscope was                            introduced through the mouth, and advanced to the                            second part of duodenum. The upper GI endoscopy was                            accomplished without difficulty. The patient                            tolerated the procedure well. Scope In: Scope Out: Findings:      The Z-line was mildly irregular and was found 37 cm from the incisors.       Biopsies were taken with a cold forceps for histology. Estimated blood       loss was minimal.      The upper third of the esophagus, middle third of the esophagus and       lower third of the esophagus were normal.      Scattered mild inflammation characterized by erythema was found in the       gastric body, at the incisura and in the gastric antrum. No ulcers.       Biopsies were taken with a cold forceps for Helicobacter pylori testing.       Estimated blood loss was minimal.      The duodenal bulb, first portion of the duodenum and second portion of       the duodenum were normal. Biopsies for histology were taken with a cold       forceps for evaluation of celiac disease. Estimated blood loss was       minimal. Impression:               - Z-line irregular, 37 cm from the incisors.                            Biopsied.                           -  Normal upper third of esophagus, middle third of                            esophagus and lower third of esophagus.                           - Gastritis. Biopsied.                           - Normal duodenal bulb, first portion of the                            duodenum and second portion of the duodenum.                            Biopsied. Moderate Sedation:      Not Applicable - Patient had care per Anesthesia. Recommendation:           - Patient has a contact number available for                             emergencies. The signs and symptoms of potential                            delayed complications were discussed with the                            patient. Return to normal activities tomorrow.                            Written discharge instructions were provided to the                            patient.                           - Resume previous diet.                           - Continue present medications.                           - Await pathology results.                           - Perform a colonoscopy today. Procedure Code(s):        --- Professional ---                           [phone], Esophagogastroduodenoscopy, flexible,                            transoral; with biopsy, single or multiple Diagnosis Code(s):        --- Professional ---                           K22.8, Other specified diseases  of esophagus                           K29.70, Gastritis, unspecified, without bleeding                           K21.9, Gastro-esophageal reflux disease without                            esophagitis                           R19.5, Other fecal abnormalities CPT copyright 2019 American Medical Association. All rights reserved. The codes documented in this report are preliminary and upon coder review may  be revised to meet current compliance requirements. Gerrit Heck, MD 03/29/2019 12:06:25 PM Number of Addenda: 0

## 2019-03-29 NOTE — Op Note (Signed)
[facility] Patient Name: [name] Procedure Date: 03/29/2019 MRN: [id number] Attending MD: Gerrit Heck , MD Date of Birth: 18-Apr-1956 CSN: [id number] Age: 63 Admit Type: Outpatient Procedure:                Colonoscopy Indications:              Heme positive stool, Anemia                           Colonoscopy in 05/2007 with a tubular adenoma and HP Providers:                Gerrit Heck, MD, Grace Isaac, RN, Lina Sar, Technician, Caryl Pina CRNA Referring MD:              Medicines:                Monitored Anesthesia Care Complications:            No immediate complications. Estimated Blood Loss:     Estimated blood loss was minimal. Procedure:                Pre-Anesthesia Assessment:                           - Prior to the procedure, a History and Physical                            was performed, and patient medications and                            allergies were reviewed. The patient's tolerance of                            previous anesthesia was also reviewed. The risks                            and benefits of the procedure and the sedation                            options and risks were discussed with the patient.                            All questions were answered, and informed consent                            was obtained. Prior Anticoagulants: The patient has                            taken no previous anticoagulant or antiplatelet                            agents. ASA Grade Assessment: III - A patient with  severe systemic disease. After reviewing the risks                            and benefits, the patient was deemed in                            satisfactory condition to undergo the procedure.                           After obtaining informed consent, the colonoscope                            was passed under direct vision. Throughout the        procedure, the patient's blood pressure, pulse, and                            oxygen saturations were monitored continuously. The                            [id number]) Olympus colonoscope was                            introduced through the anus and advanced to the the                            cecum, identified by appendiceal orifice and                            ileocecal valve. The colonoscopy was performed                            without difficulty. The patient tolerated the                            procedure well. The quality of the bowel                            preparation was adequate. The ileocecal valve,                            appendiceal orifice, and rectum were photographed. Scope In: 11:36:06 AM Scope Out: 11:59:10 AM Total Procedure Duration: 0 hours 23 minutes 4 seconds  Findings:      The perianal and digital rectal examinations were normal.      A 8 mm polyp was found in the transverse colon. The polyp was       semi-pedunculated. The polyp was removed with a cold snare. Resection       and retrieval were complete. Estimated blood loss was minimal.      Three sessile polyps were found in the rectum. The polyps were 2 to 3 mm       in size. These polyps were removed with a cold biopsy forceps. Resection       and retrieval were complete. Estimated blood loss was minimal.      The exam was otherwise normal throughout the  remainder of the colon.      The retroflexed view of the distal rectum and anal verge was normal and       showed no anal or rectal abnormalities. Impression:               - One 8 mm polyp in the transverse colon, removed                            with a cold snare. Resected and retrieved.                           - Three 2 to 3 mm polyps in the rectum, removed                            with a cold biopsy forceps. Resected and retrieved.                           - The distal rectum and anal verge are normal on                             retroflexion view. Moderate Sedation:      Not Applicable - Patient had care per Anesthesia. Recommendation:           - Patient has a contact number available for                            emergencies. The signs and symptoms of potential                            delayed complications were discussed with the                            patient. Return to normal activities tomorrow.                            Written discharge instructions were provided to the                            patient.                           - Resume previous diet.                           - Continue present medications.                           - Await pathology results.                           - Repeat colonoscopy for surveillance based on                            pathology results.                           -  Return to GI clinic PRN. Procedure Code(s):        --- Professional ---                           [phone], Colonoscopy, flexible; with removal of                            tumor(s), polyp(s), or other lesion(s) by snare                            technique                           45380, 59, Colonoscopy, flexible; with biopsy,                            single or multiple Diagnosis Code(s):        --- Professional ---                           K63.5, Polyp of colon                           K62.1, Rectal polyp                           R19.5, Other fecal abnormalities CPT copyright 2019 American Medical Association. All rights reserved. The codes documented in this report are preliminary and upon coder review may  be revised to meet current compliance requirements. Gerrit Heck, MD 03/29/2019 12:11:14 PM Number of Addenda: 0

## 2019-03-29 NOTE — Anesthesia Postprocedure Evaluation (Signed)
Anesthesia Post Note  Patient: [name]  Procedure(s) Performed: ESOPHAGOGASTRODUODENOSCOPY (EGD) WITH PROPOFOL (N/A ) COLONOSCOPY WITH PROPOFOL (N/A ) BIOPSY POLYPECTOMY     Patient location during evaluation: PACU Anesthesia Type: MAC Level of consciousness: awake and alert Pain management: pain level controlled Vital Signs Assessment: post-procedure vital signs reviewed and stable Respiratory status: spontaneous breathing, nonlabored ventilation, respiratory function stable and patient connected to nasal cannula oxygen Cardiovascular status: stable and blood pressure returned to baseline Postop Assessment: no apparent nausea or vomiting Anesthetic complications: no    Last Vitals:  Vitals:   03/29/19 1210 03/29/19 1220  BP: (!) 174/74 (!) 198/70  Pulse: 70 61  Resp: 15 (!) 21  Temp:    SpO2: 100% 100%    Last Pain:  Vitals:   03/29/19 1220  TempSrc:   PainSc: 0-No pain                 Effie Berkshire

## 2019-03-29 NOTE — Discharge Instructions (Signed)
YOU HAD AN ENDOSCOPIC PROCEDURE TODAY: Refer to the procedure report and other information in the discharge instructions given to you for any specific questions about what was found during the examination. If this information does not answer your questions, please call [place] office at [phone] to clarify.  ° °YOU SHOULD EXPECT: Some feelings of bloating in the abdomen. Passage of more gas than usual. Walking can help get rid of the air that was put into your GI tract during the procedure and reduce the bloating. If you had a lower endoscopy (such as a colonoscopy or flexible sigmoidoscopy) you may notice spotting of blood in your stool or on the toilet paper. Some abdominal soreness may be present for a day or two, also. ° °DIET: Your first meal following the procedure should be a light meal and then it is ok to progress to your normal diet. A half-sandwich or bowl of soup is an example of a good first meal. Heavy or fried foods are harder to digest and may make you feel nauseous or bloated. Drink plenty of fluids but you should avoid alcoholic beverages for 24 hours. If you had a esophageal dilation, please see attached instructions for diet.   ° °ACTIVITY: Your care partner should take you home directly after the procedure. You should plan to take it easy, moving slowly for the rest of the day. You can resume normal activity the day after the procedure however YOU SHOULD NOT DRIVE, use power tools, machinery or perform tasks that involve climbing or major physical exertion for 24 hours (because of the sedation medicines used during the test).  ° °SYMPTOMS TO REPORT IMMEDIATELY: °A gastroenterologist can be reached at any hour. Please call [phone]  for any of the following symptoms:  °Following lower endoscopy (colonoscopy, flexible sigmoidoscopy) °Excessive amounts of blood in the stool  °Significant tenderness, worsening of abdominal pains  °Swelling of the abdomen that is new, acute  °Fever of 100° or  higher  °Following upper endoscopy (EGD, EUS, ERCP, esophageal dilation) °Vomiting of blood or coffee ground material  °New, significant abdominal pain  °New, significant chest pain or pain under the shoulder blades  °Painful or persistently difficult swallowing  °New shortness of breath  °Black, tarry-looking or red, bloody stools ° °FOLLOW UP:  °If any biopsies were taken you will be contacted by phone or by letter within the next 1-3 weeks. Call [phone]  if you have not heard about the biopsies in 3 weeks.  °Please also call with any specific questions about appointments or follow up tests. ° °

## 2019-03-29 NOTE — H&P (Addendum)
P  Chief Complaint:    FOBT positive stools  HPI:     [name] is a 63 y.o. female with a history of CKD 3, morbid obesity, bilateral BKA, diabetes with neuropathy, hypertension, CAD, mood disorder, CVA, PVD, chronic anemia, currently in nursing facility at [facility] referred to the Gastroenterology Clinic for evaluation of FOBT positive stool.  She presents today for colonoscopy at [facility].  No overt GI blood loss. Good PO intake, weight stable.  Otherwise, she is without any GI symptoms.  No nausea, vomiting, diarrhea, constipation, fever, chills, abdominal pain.  Per Hematology notes, she had a colonoscopy in 05/2007 with a tubular adenoma and HP.  She does not recall any of those results.  Most recent labs from 10/2018: Normal BMP (BUN/creatinine 20/0.89), A1c 5.1%.  CBC in 05/2018: H/H9 0.2/30.6, MCV 85, WBC 14.5, PLT 317.  Ferritin 160.  Normal B12, folate. Follows with Dr. Walden Field (Hematology), last seen in 05/2018.  Does receive ophthalmic Avastin which may contribute to her anemia.  CT in 05/2018 with hepatomegaly, otherwise normal liver and no duct dilatation.  Normal LAE's.     Review of systems:     No chest pain, no SOB, no fevers, no urinary sx   Past Medical History:  Diagnosis Date  . Atypical chest pain    a. non obstructive by cath in 2008 and 2010;  b. 02/2012 Myoview: non-ischemic, EF 57%  . Cellulitis    a. 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 Chronic kidney disease   . Constipation   . CVA (cerebral infarction)    a.  Small right parietal noted incidentally 04/2007;  b. right sided embolic CVA 123456;  c. 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 Diabetes mellitus, type II ([place])   . Diabetic retinopathy ([place])    NPDR w/edema OU  . Diaphragmatic hernia without mention of obstruction or gangrene   . Dry skin   . Esophageal reflux   .  HTN (hypertension)   . 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 Hyperlipidemia   . Irritable bowel syndrome   . Morbid obesity ([place])   . Obesity, unspecified   . Pain in shoulder 06/28/2015  . Peripheral neuropathy   . Stroke [facility] [place])    2013-'no residual'  . Syncope and collapse    a. near-syncopal episode in November 2008;  b. 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  . Tobacco abuse   . Vertigo     Patient's surgical history, family medical history, social history, medications and allergies were all reviewed in Epic    Current Facility-Administered Medications  Medication Dose Route Frequency Provider Last Rate Last Dose  . 0.9 %  sodium chloride infusion   Intravenous Continuous Cirigliano, Vito V, DO       Facility-Administered Medications Ordered in Other Encounters  Medication Dose Route Frequency Provider Last Rate Last Dose  . lactated ringers infusion    Continuous PRN Caryl Pina T, CRNA        Physical Exam:     BP (!) 172/80   Pulse (!) 59   Temp 97.6 F (36.4 C) (Axillary)   Resp 17   Ht 5\' 6"  (1.676 m)   Wt 113.4 kg   SpO2 98%   BMI 40.35 kg/m   GENERAL:  Pleasant female in NAD  PSYCH: : Cooperative, normal affect EENT:  conjunctiva pink, mucous membranes moist, neck supple without masses CARDIAC:  RRR, no murmur heard, no peripheral edema PULM: Normal respiratory effort, lungs CTA bilaterally, no wheezing ABDOMEN:  Nondistended, soft, nontender. No obvious masses, no hepatomegaly,  normal bowel sounds SKIN:  turgor, no lesions seen Musculoskeletal:  Normal muscle tone, normal strength NEURO: Alert and oriented x 3, no focal neurologic deficits   IMPRESSION and PLAN:    1) FOBT positive stool 2) Normocytic anemia 3) Obesity 4) Diabetes 5) CAD  - Plan for endoscopic studies today at [facility] to resume ASA 81 mg - Can resume iron after procedures completed today - Continue to follow-up with Hematology re: normocytic anemia  The  indications, risks, and benefits of colonoscopy were explained to the patient in detail. Risks include but are not limited to bleeding, perforation, adverse reaction to medications, and cardiopulmonary compromise. Sequelae include but are not limited to the possibility of surgery, hospitalization, and mortality. The patient verbalized understanding and wished to proceed.          Lavena Bullion ,DO, FACG 03/29/2019, 10:55 AM

## 2019-03-29 NOTE — Transfer of Care (Signed)
Immediate Anesthesia Transfer of Care Note  Patient: [name]  Procedure(s) Performed: ESOPHAGOGASTRODUODENOSCOPY (EGD) WITH PROPOFOL (N/A ) COLONOSCOPY WITH PROPOFOL (N/A ) BIOPSY POLYPECTOMY  Patient Location: Endoscopy Unit  Anesthesia Type:MAC  Level of Consciousness: awake, alert , oriented and patient cooperative  Airway & Oxygen Therapy: Patient Spontanous Breathing and Patient connected to face mask oxygen  Post-op Assessment: Report given to RN, Post -op Vital signs reviewed and stable and Patient moving all extremities  Post vital signs: Reviewed and stable  Last Vitals:  Vitals Value Taken Time  BP 160/69 03/29/19 1206  Temp 36.4 C 03/29/19 1206  Pulse 71 03/29/19 1208  Resp 29 03/29/19 1208  SpO2 100 % 03/29/19 1208  Vitals shown include unvalidated device data.  Last Pain:  Vitals:   03/29/19 1206  TempSrc: Axillary  PainSc: 0-No pain         Complications: No apparent anesthesia complications

## 2019-03-30 LAB — SURGICAL PATHOLOGY

## 2019-03-31 ENCOUNTER — Encounter: Payer: Self-pay | Admitting: Gastroenterology

## 2019-03-31 ENCOUNTER — Encounter (HOSPITAL_COMMUNITY): Payer: Self-pay | Admitting: Gastroenterology

## 2019-04-05 ENCOUNTER — Inpatient Hospital Stay: Payer: Medicare Other | Attending: Hematology | Admitting: Hematology

## 2019-04-05 ENCOUNTER — Telehealth: Payer: Self-pay | Admitting: Hematology

## 2019-04-05 ENCOUNTER — Inpatient Hospital Stay: Payer: Medicare Other

## 2019-04-05 ENCOUNTER — Other Ambulatory Visit: Payer: Self-pay

## 2019-04-05 VITALS — BP 154/68 | HR 68 | Temp 97.8°F | Resp 18 | Ht 66.0 in | Wt 243.0 lb

## 2019-04-05 DIAGNOSIS — Z6839 Body mass index (BMI) 39.0-39.9, adult: Secondary | ICD-10-CM | POA: Diagnosis not present

## 2019-04-05 DIAGNOSIS — K635 Polyp of colon: Secondary | ICD-10-CM | POA: Insufficient documentation

## 2019-04-05 DIAGNOSIS — E119 Type 2 diabetes mellitus without complications: Secondary | ICD-10-CM | POA: Insufficient documentation

## 2019-04-05 DIAGNOSIS — N189 Chronic kidney disease, unspecified: Secondary | ICD-10-CM | POA: Insufficient documentation

## 2019-04-05 DIAGNOSIS — E669 Obesity, unspecified: Secondary | ICD-10-CM | POA: Insufficient documentation

## 2019-04-05 DIAGNOSIS — D508 Other iron deficiency anemias: Secondary | ICD-10-CM

## 2019-04-05 DIAGNOSIS — Z8249 Family history of ischemic heart disease and other diseases of the circulatory system: Secondary | ICD-10-CM | POA: Diagnosis not present

## 2019-04-05 DIAGNOSIS — Z836 Family history of other diseases of the respiratory system: Secondary | ICD-10-CM | POA: Insufficient documentation

## 2019-04-05 DIAGNOSIS — Z7289 Other problems related to lifestyle: Secondary | ICD-10-CM | POA: Diagnosis not present

## 2019-04-05 DIAGNOSIS — I10 Essential (primary) hypertension: Secondary | ICD-10-CM | POA: Diagnosis not present

## 2019-04-05 DIAGNOSIS — Z800 Family history of malignant neoplasm of digestive organs: Secondary | ICD-10-CM | POA: Insufficient documentation

## 2019-04-05 DIAGNOSIS — D649 Anemia, unspecified: Secondary | ICD-10-CM

## 2019-04-05 DIAGNOSIS — K2970 Gastritis, unspecified, without bleeding: Secondary | ICD-10-CM | POA: Insufficient documentation

## 2019-04-05 DIAGNOSIS — Z79899 Other long term (current) drug therapy: Secondary | ICD-10-CM | POA: Diagnosis not present

## 2019-04-05 DIAGNOSIS — Z87891 Personal history of nicotine dependence: Secondary | ICD-10-CM | POA: Insufficient documentation

## 2019-04-05 DIAGNOSIS — Z833 Family history of diabetes mellitus: Secondary | ICD-10-CM | POA: Diagnosis not present

## 2019-04-05 LAB — CBC WITH DIFFERENTIAL/PLATELET
Abs Immature Granulocytes: 0.09 10*3/uL — ABNORMAL HIGH (ref 0.00–0.07)
Basophils Absolute: 0.1 10*3/uL (ref 0.0–0.1)
Basophils Relative: 1 %
Eosinophils Absolute: 0.2 10*3/uL (ref 0.0–0.5)
Eosinophils Relative: 2 %
HCT: 31.3 % — ABNORMAL LOW (ref 36.0–46.0)
Hemoglobin: 9.3 g/dL — ABNORMAL LOW (ref 12.0–15.0)
Immature Granulocytes: 1 %
Lymphocytes Relative: 20 %
Lymphs Abs: 2.1 10*3/uL (ref 0.7–4.0)
MCH: 24.3 pg — ABNORMAL LOW (ref 26.0–34.0)
MCHC: 29.7 g/dL — ABNORMAL LOW (ref 30.0–36.0)
MCV: 81.9 fL (ref 80.0–100.0)
Monocytes Absolute: 0.4 10*3/uL (ref 0.1–1.0)
Monocytes Relative: 3 %
Neutro Abs: 7.8 10*3/uL — ABNORMAL HIGH (ref 1.7–7.7)
Neutrophils Relative %: 73 %
Platelets: 300 10*3/uL (ref 150–400)
RBC: 3.82 MIL/uL — ABNORMAL LOW (ref 3.87–5.11)
RDW: 17.6 % — ABNORMAL HIGH (ref 11.5–15.5)
WBC: 10.6 10*3/uL — ABNORMAL HIGH (ref 4.0–10.5)
nRBC: 0 % (ref 0.0–0.2)

## 2019-04-05 LAB — IRON AND TIBC
Iron: 35 ug/dL — ABNORMAL LOW (ref 41–142)
Saturation Ratios: 15 % — ABNORMAL LOW (ref 21–57)
TIBC: 233 ug/dL — ABNORMAL LOW (ref 236–444)
UIBC: 198 ug/dL (ref 120–384)

## 2019-04-05 LAB — CMP (CANCER CENTER ONLY)
ALT: 12 U/L (ref 0–44)
AST: 10 U/L — ABNORMAL LOW (ref 15–41)
Albumin: 2.9 g/dL — ABNORMAL LOW (ref 3.5–5.0)
Alkaline Phosphatase: 143 U/L — ABNORMAL HIGH (ref 38–126)
Anion gap: 10 (ref 5–15)
BUN: 22 mg/dL (ref 8–23)
CO2: 26 mmol/L (ref 22–32)
Calcium: 9.4 mg/dL (ref 8.9–10.3)
Chloride: 103 mmol/L (ref 98–111)
Creatinine: 1.27 mg/dL — ABNORMAL HIGH (ref 0.44–1.00)
GFR, Est AFR Am: 52 mL/min — ABNORMAL LOW (ref >60)
GFR, Estimated: 45 mL/min — ABNORMAL LOW (ref >60)
Glucose, Bld: 236 mg/dL — ABNORMAL HIGH (ref 70–99)
Potassium: 4.7 mmol/L (ref 3.5–5.1)
Sodium: 139 mmol/L (ref 135–145)
Total Bilirubin: 0.3 mg/dL (ref 0.3–1.2)
Total Protein: 7.1 g/dL (ref 6.5–8.1)

## 2019-04-05 LAB — VITAMIN B12: Vitamin B-12: 2392 pg/mL — ABNORMAL HIGH (ref 180–914)

## 2019-04-05 LAB — SEDIMENTATION RATE: Sed Rate: 84 mm/hr — ABNORMAL HIGH (ref 0–22)

## 2019-04-05 LAB — FERRITIN: Ferritin: 82 ng/mL (ref 11–307)

## 2019-04-05 LAB — LACTATE DEHYDROGENASE: LDH: 122 U/L (ref 98–192)

## 2019-04-05 LAB — TSH: TSH: 1.081 u[IU]/mL (ref 0.308–3.960)

## 2019-04-05 NOTE — Telephone Encounter (Signed)
Scheduled appt per 11/17 los. ° °Printed calendar and avs. °

## 2019-04-05 NOTE — Progress Notes (Signed)
HEMATOLOGY/ONCOLOGY CONSULTATION NOTE  Date of Service: 04/05/2019  Patient Care Team: Duffy Bruce, Manya Silvas, MD as PCP - General (Internal Medicine) Verl Blalock, Marijo Conception, MD (Inactive) (Cardiology)  CHIEF COMPLAINTS/PURPOSE OF CONSULTATION:  Normocytic Anemia  HISTORY OF PRESENTING ILLNESS:   [name] is a wonderful 63 y.o. female who has previously been seen by Dr. Walden Field for evaluation and management of normocytic anemia. The pt reports that she is doing well overall.  The pt reports that she had a Colonoscopy and an Upper Endoscopy last week. She denies any major medication changes since her last visit. She has felt the same in the last year and has no new concerns. Pt quit smoking over a year ago and does not drink much alcohol. Pt is currently at [facility], a skilled nursing facility. She has not been vitamin deficient to her knowledge. Pt reports that her diabetes has been well-controlled and denies any kidney dysfunction due to diabetes. She has been anemic for years and does now know the cause. She was not having frequent infections prior to her initial diagnosis of anemia. She believes that her mother had anemia but is unsure of any specific diagnoses. Pt has not had any concerns for thyroid dysfunction or sudden weight changes. Pt has continued eating and drinking well.   Of note prior to the patient's visit today, pt has had Upper Endoscopy completed on 03/29/2019 with results revealing " - Z-line irregular, 37 cm from the incisors. Biopsied. - Normal upper third of esophagus, middle third of esophagus and lower third of esophagus. - Gastritis. Biopsied. - Normal duodenal bulb, first portion of the duodenum and second portion of the duodenum. Biopsied."  Of note since the patient's last visit, pt has had Colonoscopy completed on 03/29/2019 with results revealing "- One 8 mm polyp in the transverse colon, removed with a cold snare. Resected and retrieved. - Three 2 to 3 mm  polyps in the rectum, removed with a cold biopsy forceps. Resected and retrieved. - The distal rectum and anal verge are normal on retroflexion view."  Most recent lab results (06/17/2018) of CBC is as follows: all values are WNL except for WBC at 14.5K, RBC at 3.60, Hgb at 9.2, HCT at 30.6, MCH at 25.6, RDW at 17.6, Neutro Abs at 11.5K, Abs Immature Granulocytes at 0.12K, Glucose at 12, Creatinine at 1.04, Albumin at 3.2, AST at 6, Total Bilirubin at <0.2, GFR Est Non Afr Am at 57.  06/17/2018 Ferritin at 116  On review of systems, pt denies SOB, chest pains, unexpected weight changes, abdominal pain and any other symptoms.   On PMHx the pt reports Diabetes, CKD, HTN, Anemia. On Social Hx the pt reports little EtOH use, previous smoker (quit last year). On Family Hx the pt reports that her mother had anemia.  MEDICAL HISTORY:  Past Medical History:  Diagnosis Date   Atypical chest pain    a. non obstructive by cath in 2008 and 2010;  b. 02/2012 Myoview: non-ischemic, EF 57%   Cellulitis    a. left foot-> s/p L BKA   Chronic kidney disease    Constipation    CVA (cerebral infarction)    a.  Small right parietal noted incidentally 04/2007;  b. right sided embolic CVA 11/8586;  c. 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 near-syncopal episode in November 2008;  b. 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   Tobacco  abuse    Vertigo     SURGICAL HISTORY: Past Surgical History:  Procedure Laterality Date   AMPUTATION  12/30/2011   Procedure: AMPUTATION RAY;  Surgeon: Wylene Simmer, MD;  Location: [facility];  Service: Orthopedics;  Laterality: Left;  LEFT FIRST RAY AMPUTATION   AMPUTATION  01/13/2012   Procedure: AMPUTATION BELOW KNEE;  Surgeon: Wylene Simmer, MD;  Location: [facility];  Service: Orthopedics;  Laterality: Left;   AMPUTATION  03/04/2012   Procedure: AMPUTATION BELOW KNEE;  Surgeon: Wylene Simmer, MD;  Location: [facility];  Service: Orthopedics;  Laterality: Left;  Revision of Left Below Knee Amputation   AMPUTATION Right 11/02/2013   Procedure: AMPUTATION BELOW KNEE;  Surgeon: Newt Minion, MD;  Location: [facility];  Service: Orthopedics;  Laterality: Right;  Right Below Knee Amputation   BIOPSY  03/29/2019   Procedure: BIOPSY;  Surgeon: Lavena Bullion, DO;  Location: [facility];  Service: Gastroenterology;;  EGD and Colon   CARDIAC CATHETERIZATION     LAD 30%, circumflex 50%, OM 75%, RI 60% with small branch 80%, dominant RCA 60%, EF 45-50%   CHOLECYSTECTOMY     COLONOSCOPY     COLONOSCOPY WITH PROPOFOL N/A 03/29/2019   Procedure: COLONOSCOPY WITH PROPOFOL;  Surgeon: Lavena Bullion, DO;  [facility];  Service: Gastroenterology;  Laterality: N/A;   ESOPHAGOGASTRODUODENOSCOPY (EGD) WITH PROPOFOL N/A 03/29/2019   Procedure: ESOPHAGOGASTRODUODENOSCOPY (EGD) WITH PROPOFOL;  Surgeon: Lavena Bullion, DO;  Location[facility];  Service: Gastroenterology;  Laterality: N/A;   EYE SURGERY Right    cataract   I&D EXTREMITY  12/23/2011   Procedure: IRRIGATION AND DEBRIDEMENT EXTREMITY;  Surgeon: Augustin Schooling, MD;  Location: [facility];  Service: Orthopedics;  Laterality: Left;  Left Foot   I&D EXTREMITY  12/26/2011   Procedure: IRRIGATION AND DEBRIDEMENT EXTREMITY;  Surgeon: Wylene Simmer, MD;  Location: [facility] [place];  Service: Orthopedics;  Laterality: Left;  LEFT FOOT I&D WITH POSSIBLE WOUND  VAC APPLICATION, POSSIBLE LEFT FIRST RAY AMPUTATION   I&D EXTREMITY Right 10/29/2013   Procedure: IRRIGATION AND DEBRIDEMENT EXTREMITY;  Surgeon: Johnn Hai, MD;  Location: [facility];  Service: Orthopedics;  Laterality: Right;   Invasive Electrophysiologic Study  5/09   followed by insertion of an implantable loop recorder. S/p removal    Multiple Toe Surgeries     POLYPECTOMY  03/29/2019   Procedure: POLYPECTOMY;  Surgeon: Lavena Bullion, DO;  Location: [facility];  Service: Gastroenterology;;   TEE WITHOUT CARDIOVERSION N/A 07/07/2012   Procedure: TRANSESOPHAGEAL ECHOCARDIOGRAM (TEE);  Surgeon: Lelon Perla, MD;  [facility];  Service: Cardiovascular;  Laterality: N/A;    SOCIAL HISTORY: Social History   Socioeconomic History   Marital status: Single    Spouse name: Not on file   Number of children: Not on file   Years of education: Not on  file   Highest education level: Not on file  Occupational History   Occupation: Document processor    [employer] resource strain: Not on file   Food insecurity    Worry: Not on file    Inability: Not on file   Transportation needs    Medical: Not on file    Non-medical: Not on file  Tobacco Use   Smoking status: Former Smoker    Packs/day: 0.25    Years: 40.00    Pack years: 10.00    Types: Cigarettes   Smokeless tobacco: Never Used   Tobacco comment: has not smoked in about a year  Substance and Sexual Activity   Alcohol use: Yes    Alcohol/week: 4.0 standard drinks    Types: 4 Cans of beer per week    Comment: occ- foot ball season- 4 beers a week during football season.   Drug use: No   Sexual activity: Not Currently    Birth control/protection: Post-menopausal  Lifestyle   Physical activity    Days per week: Not on file    Minutes per session: Not on file   Stress: Not on file  Relationships   Social connections    Talks on phone: Not on  file    Gets together: Not on file    Attends religious service: Not on file    Active member of club or organization: Not on file    Attends meetings of clubs or organizations: Not on file    Relationship status: Not on file   Intimate partner violence    Fear of current or ex partner: Not on file    Emotionally abused: Not on file    Physically abused: Not on file    Forced sexual activity: Not on file  Other Topics Concern   Not on file  Social History Narrative   Lives at [facility] since 12/19/14     Does not routinely exercise.     works as Barista.    >25 pack year history.    FULL CODE    FAMILY HISTORY: Family History  Problem Relation Age of Onset   Pneumonia Mother    Gallbladder disease Mother        cancer   Heart failure Mother    Diabetes Father    Coronary artery disease Father    Stroke Neg Hx     ALLERGIES:  is allergic to banana; penicillins; and strawberry extract.  MEDICATIONS:  Current Outpatient Medications  Medication Sig Dispense Refill   albuterol (VENTOLIN HFA) 108 (90 Base) MCG/ACT inhaler Inhale 2 puffs into the lungs every 4 (four) hours as needed for wheezing or shortness of breath.     amLODipine (NORVASC) 2.5 MG tablet Take 2.5 mg by mouth daily.     aspirin EC 81 MG tablet Take 81 mg by mouth daily.     atorvastatin (LIPITOR) 20 MG tablet Take 20 mg by mouth daily.     carvedilol (COREG) 12.5 MG tablet Take 12.5 mg by mouth 2 (two) times daily.      cetirizine (ZYRTEC) 10 MG tablet Take 10 mg by mouth daily.     Cholecalciferol (VITAMIN D) 50 MCG (2000 UT) tablet Take 2,000 Units by mouth daily.     cyanocobalamin 1000 MCG tablet Take 1,000 mcg by mouth daily.     docusate sodium 100 MG CAPS Take 100 mg by mouth 2 (two) times daily. 10 capsule 0  ferrous sulfate 325 (65 FE) MG tablet Take 325 mg by mouth daily with breakfast.     gabapentin (NEURONTIN) 100 MG capsule Take 100 mg by mouth See admin  instructions. Take with 400 mg morning dose to equal 500 mg in the morning, take with 600 mg night dose to equal 700 mg at night     gabapentin (NEURONTIN) 400 MG capsule Take 400 mg by mouth 2 (two) times daily.      gabapentin (NEURONTIN) 600 MG tablet Take 600 mg by mouth at bedtime.     hydrALAZINE (APRESOLINE) 25 MG tablet Take 75 mg by mouth 3 (three) times daily.     HYDROcodone-acetaminophen (NORCO/VICODIN) 5-325 MG tablet Take 1 tablet by mouth every 8 (eight) hours as needed for moderate pain.      insulin lispro (HUMALOG) 100 UNIT/ML injection Inject 2-14 Units into the skin 4 (four) times daily -  with meals and at bedtime. 201-250 = 2 units, 251-300 = 4 units, 301-350= 6 units, 351-400=8 units, 401-450=10 units, 451-500 =12 units >500 = 14 units     LEVEMIR 100 UNIT/ML injection Inject 53 Units into the skin at bedtime.      lisinopril-hydrochlorothiazide (ZESTORETIC) 20-12.5 MG tablet Take 2 tablets by mouth daily.     PAZEO 0.7 % SOLN Place 1 drop into both eyes daily.      polyethylene glycol (MIRALAX / GLYCOLAX) packet Take 17 g by mouth daily as needed for moderate constipation. (Patient taking differently: Take 17 g by mouth daily. ) 14 each 0   SitaGLIPtin-MetFORMIN HCl (JANUMET XR) 50-1000 MG TB24 Take 1 tablet by mouth daily.     Tetrahydrozoline HCl (VISINE OP) Place 1 drop into both eyes at bedtime.     vitamin C (ASCORBIC ACID) 500 MG tablet Take 500 mg by mouth daily.     Current Facility-Administered Medications  Medication Dose Route Frequency Provider Last Rate Last Dose   aflibercept (EYLEA) SOLN 2 mg  2 mg Intravitreal  Bernarda Caffey, MD   2 mg at 03/19/18 1621   Bevacizumab (AVASTIN) SOLN 1.25 mg  1.25 mg Intravitreal  Bernarda Caffey, MD   1.25 mg at 11/01/17 2300   Bevacizumab (AVASTIN) SOLN 1.25 mg  1.25 mg Intravitreal  Bernarda Caffey, MD   1.25 mg at 12/01/17 1437   Bevacizumab (AVASTIN) SOLN 1.25 mg  1.25 mg Intravitreal  Bernarda Caffey, MD   1.25  mg at 12/31/17 2354   Bevacizumab (AVASTIN) SOLN 1.25 mg  1.25 mg Intravitreal  Bernarda Caffey, MD   1.25 mg at 01/27/18 1508    REVIEW OF SYSTEMS:    10 Point review of Systems was done is negative except as noted above.  PHYSICAL EXAMINATION: ECOG PERFORMANCE STATUS: 3 - Symptomatic, >50% confined to bed  . Vitals:   04/05/19 1022  BP: (!) 154/68  Pulse: 68  Resp: 18  Temp: 97.8 F (36.6 C)  SpO2: 100%   Filed Weights   04/05/19 1022  Weight: 243 lb (110.2 kg)   .Body mass index is 39.22 kg/m.  Exam was given in a wheelchair   GENERAL:alert, in no acute distress and comfortable SKIN: no acute rashes, no significant lesions EYES: conjunctiva are pink and non-injected, sclera anicteric OROPHARYNX: MMM, no exudates, no oropharyngeal erythema or ulceration NECK: supple, no JVD LYMPH:  no palpable lymphadenopathy in the cervical, axillary or inguinal regions LUNGS: distant breath sounds  HEART: distant heart sounds ABDOMEN:  normoactive bowel sounds , non tender, not distended.  Extremity: b/l BKA PSYCH: alert & oriented x 3 with fluent speech NEURO: no focal motor/sensory deficits  LABORATORY DATA:  I have reviewed the data as listed  . CBC Latest Ref Rng & Units 04/05/2019 06/17/2018 05/13/2018  WBC 4.0 - 10.5 K/uL 10.6(H) 14.5(H) 14.1(H)  Hemoglobin 12.0 - 15.0 g/dL 9.3(L) 9.2(L) 8.5(L)  Hematocrit 36.0 - 46.0 % 31.3(L) 30.6(L) 28.8(L)  Platelets 150 - 400 K/uL 300 317 269   . CBC    Component Value Date/Time   WBC 10.6 (H) 04/05/2019 1057   RBC 3.82 (L) 04/05/2019 1057   HGB 9.3 (L) 04/05/2019 1057   HCT 31.3 (L) 04/05/2019 1057   PLT 300 04/05/2019 1057   MCV 81.9 04/05/2019 1057   MCH 24.3 (L) 04/05/2019 1057   MCHC 29.7 (L) 04/05/2019 1057   RDW 17.6 (H) 04/05/2019 1057   LYMPHSABS 2.1 04/05/2019 1057   MONOABS 0.4 04/05/2019 1057   EOSABS 0.2 04/05/2019 1057   BASOSABS 0.1 04/05/2019 1057     . CMP Latest Ref Rng & Units 04/05/2019  06/17/2018 05/13/2018  Glucose 70 - 99 mg/dL 236(H) 122(H) 126(H)  BUN 8 - 23 mg/dL 22 21 25(H)  Creatinine 0.44 - 1.00 mg/dL 1.27(H) 1.04(H) 1.14(H)  Sodium 135 - 145 mmol/L 139 143 142  Potassium 3.5 - 5.1 mmol/L 4.7 4.3 4.2  Chloride 98 - 111 mmol/L 103 107 109  CO2 22 - 32 mmol/L _0 Calcium 8.9 - 10.3 mg/dL 9.4 9.2 8.9  Total Protein 6.5 - 8.1 g/dL 7.1 6.9 6.7  Total Bilirubin 0.3 - 1.2 mg/dL 0.3 <0.2(L) <0.2(L)  Alkaline Phos 38 - 126 U/L 143(H) 125 116  AST 15 - 41 U/L 10(L) 6(L) 8(L)  ALT 0 - 44 U/L _1 RADIOGRAPHIC STUDIES: I have personally reviewed the radiological images as listed and agreed with the findings in the report. No results found.  ASSESSMENT & PLAN:   63 yo with   1) Normocytic Anemia PLAN: -Discussed patient's most recent labs from 06/17/2018, all values are WNL except for WBC at 14.5K, RBC at 3.60, Hgb at 9.2, HCT at 30.6, MCH at 25.6, RDW at 17.6, Neutro Abs at 11.5K, Abs Immature Granulocytes at 0.12K, Glucose at 12, Creatinine at 1.04, Albumin at 3.2, AST at 6, Total Bilirubin at <0.2, GFR Est Non Afr Am at 57.  -Discussed 06/17/2018 Ferritin at 116 -Pt denies any acute concerns  -No palapable hepatospleanomegaly, no lymphadenopathy  -Will get labs today  -Will see back in 2-3 weeks via phone   FOLLOW UP: Labs today Phone visit with Dr Irene Limbo in 2-3 weeks  . Orders Placed This Encounter  Procedures   CBC with Differential/Platelet    Standing Status:   Future    Number of Occurrences:   1    Standing Expiration Date:   05/09/2020   CMP ([place] only)    Standing Status:   Future    Number of Occurrences:   1    Standing Expiration Date:   04/04/2020   Ferritin    Standing Status:   Future    Number of Occurrences:   1    Standing Expiration Date:   04/04/2020   Iron and TIBC    Standing Status:   Future    Number of Occurrences:   1    Standing Expiration Date:   04/04/2020   Vitamin B12    Standing Status:    Future  Number of Occurrences:   1    Standing Expiration Date:   04/04/2020   Sedimentation rate    Standing Status:   Future    Number of Occurrences:   1    Standing Expiration Date:   04/04/2020   Lactate dehydrogenase    Standing Status:   Future    Number of Occurrences:   1    Standing Expiration Date:   04/04/2020   Haptoglobin    Standing Status:   Future    Number of Occurrences:   1    Standing Expiration Date:   04/04/2020   Multiple Myeloma Panel (SPEP&IFE w/QIG)    Standing Status:   Future    Number of Occurrences:   1    Standing Expiration Date:   04/04/2020   Kappa/lambda light chains    Standing Status:   Future    Number of Occurrences:   1    Standing Expiration Date:   05/09/2020   TSH    Standing Status:   Future    Number of Occurrences:   1    Standing Expiration Date:   04/04/2020    All of the patients questions were answered with apparent satisfaction. The patient knows to call the clinic with any problems, questions or concerns.  I spent 20 mins counseling the patient face to face. The total time spent in the appointment was 30 minutes and more than 50% was on counseling and direct patient cares.    Sullivan Lone MD [place] AAHIVMS [facility] Hematology/Oncology Physician [facility]  (Office):       [phone] (Work cell):  [phone] (Fax):           [phone]  04/05/2019 5:44 PM   I, Yevette Edwards, am acting as a scribe for Dr. Sullivan Lone.   .I have reviewed the above documentation for accuracy and completeness, and I agree with the above. Brunetta Genera MD

## 2019-04-06 LAB — KAPPA/LAMBDA LIGHT CHAINS
Kappa free light chain: 59.6 mg/L — ABNORMAL HIGH (ref 3.3–19.4)
Kappa, lambda light chain ratio: 1.09 (ref 0.26–1.65)
Lambda free light chains: 54.5 mg/L — ABNORMAL HIGH (ref 5.7–26.3)

## 2019-04-06 LAB — HAPTOGLOBIN: Haptoglobin: 331 mg/dL (ref 37–355)

## 2019-04-07 LAB — MULTIPLE MYELOMA PANEL, SERUM
Albumin SerPl Elph-Mcnc: 3 g/dL (ref 2.9–4.4)
Albumin/Glob SerPl: 0.9 (ref 0.7–1.7)
Alpha 1: 0.3 g/dL (ref 0.0–0.4)
Alpha2 Glob SerPl Elph-Mcnc: 0.9 g/dL (ref 0.4–1.0)
B-Globulin SerPl Elph-Mcnc: 1 g/dL (ref 0.7–1.3)
Gamma Glob SerPl Elph-Mcnc: 1.3 g/dL (ref 0.4–1.8)
Globulin, Total: 3.5 g/dL (ref 2.2–3.9)
IgA: 119 mg/dL (ref 87–352)
IgG (Immunoglobin G), Serum: 1279 mg/dL (ref 586–1602)
IgM (Immunoglobulin M), Srm: 166 mg/dL (ref 26–217)
Total Protein ELP: 6.5 g/dL (ref 6.0–8.5)

## 2019-04-27 ENCOUNTER — Inpatient Hospital Stay: Payer: Medicare Other | Attending: Hematology | Admitting: Hematology

## 2019-04-28 ENCOUNTER — Telehealth: Payer: Self-pay | Admitting: Hematology

## 2019-04-28 NOTE — Telephone Encounter (Signed)
No los per 12/9.

## 2019-05-03 NOTE — Progress Notes (Signed)
This encounter was created in error - please disregard.

## 2019-05-04 ENCOUNTER — Ambulatory Visit
Admission: RE | Admit: 2019-05-04 | Discharge: 2019-05-04 | Disposition: A | Discharge status: Home / Self Care | Payer: Medicare Other | Source: Ambulatory Visit | Attending: Internal Medicine | Admitting: Internal Medicine

## 2019-05-04 ENCOUNTER — Other Ambulatory Visit: Payer: Self-pay

## 2019-05-04 DIAGNOSIS — Z1231 Encounter for screening mammogram for malignant neoplasm of breast: Secondary | ICD-10-CM

## 2019-05-06 ENCOUNTER — Telehealth: Payer: Self-pay | Admitting: *Deleted

## 2019-05-06 ENCOUNTER — Other Ambulatory Visit: Payer: Self-pay

## 2019-05-06 NOTE — Telephone Encounter (Signed)
Contacted patient regarding test results per Dr. Grier Mitts directions: Patient was not contactable for phone visit - appears to be anemia of chronic kidney disease and inflammation. Would recommend PO Iron polysaccharide 150mg  po daily and RTC with [place] in 6 months with labs cbc/diff, cmp, sed rate, ferritin/iron profile.  Attempted to reach nurse at [place] [phone] where patient lives. No answer to call after transfer from front desk. Contacted patient on cell # [phone] with above information. Patient requested office contact nurse with information.

## 2019-05-09 ENCOUNTER — Telehealth: Payer: Self-pay | Admitting: *Deleted

## 2019-05-09 NOTE — Telephone Encounter (Signed)
Attempted to call [place]  X 2  Unsuccessful.  Unable to leave message due to no answers.  Will continue to call during office hours today.

## 2019-05-09 NOTE — Telephone Encounter (Signed)
-----   Message from Rolland Bimler, RN sent at 05/06/2019  4:12 PM EST ----- Contacted patient on cell # [phone] with above information. Patient requested office contact nurse with information. Attempted to reach nurse at [place] [phone] where patient lives. No answer to call after transfer from front desk. ----- Message ----- From: Brunetta Genera, MD Sent: 05/03/2019   9:40 PM EST To: Rolland Bimler, RN  Patient was not contactable for phone visit - appears to be anemia of chronic kidney disease and inflammation. Would recommend PO Iron polysaccharide 150mg  po daily and RTC with [place] in 6 months with labs cbc/diff, cmp, sed rate, ferritin/iron profile. thx

## 2019-05-12 ENCOUNTER — Telehealth: Payer: Self-pay | Admitting: *Deleted

## 2019-05-12 ENCOUNTER — Other Ambulatory Visit: Payer: Self-pay | Admitting: *Deleted

## 2019-05-12 DIAGNOSIS — D508 Other iron deficiency anemias: Secondary | ICD-10-CM

## 2019-05-12 DIAGNOSIS — D649 Anemia, unspecified: Secondary | ICD-10-CM

## 2019-05-12 NOTE — Telephone Encounter (Signed)
[place] [phone] where patient lives. After several phone calls, spoke with Wynona Meals who identified herself as [facility] [employer].  Gave her information as directed by Dr. Irene Limbo in result note; "Patient was not contactable for phone visit - appears to be anemia of chronic kidney disease and inflammation. Would recommend PO Iron polysaccharide 150mg  po daily and RTC with [place] in 6 months with labs cbc/diff, cmp, sed rate, ferritin/iron profile." She requested an order for the Iron Polysaccharide 150 mg po daily be faxed to [facility], attn Rita Assul. Ms. Christella Scheuermann also asked that the request for an appointment in June 2021 be included with the order. Information about med and appt faxed to Ms.Assul as requested. Fax confirmation received.

## 2019-06-21 ENCOUNTER — Other Ambulatory Visit: Payer: Self-pay

## 2019-06-21 ENCOUNTER — Non-Acute Institutional Stay: Payer: Medicare Other | Admitting: Hospice

## 2019-06-21 DIAGNOSIS — G546 Phantom limb syndrome with pain: Secondary | ICD-10-CM

## 2019-06-21 DIAGNOSIS — Z515 Encounter for palliative care: Secondary | ICD-10-CM

## 2019-06-21 NOTE — Progress Notes (Signed)
[facility] Consult Note Telephone: [phone]  Fax: [phone]  PATIENT NAME: [name] DOB: 1955-06-12 MRN: [id number]  PRIMARY CARE PROVIDER:   Duffy Bruce, Manya Silvas, MD  REFERRING PROVIDER:  Duffy Bruce, Manya Silvas, MD [place],  [place] [address]  RESPONSIBLE PARTY:   Self Contact_ Niece Anna Genre [phone]  RECOMMENDATIONS/PLAN:  1. Advance Care Planning/Goals of Care: Patient affirmed she is a Full code and would want full scope of treatment. MOST form up to date in the chart. Goals include to maximize quality of life and symptom management. 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 Therapeutic listening and validation provided. 2. Symptom management: She denied SOB,  pain - phantom limb pain. Nursing staff Marzetta Board confirmed patient last day for Eliquis is 07/02/19, followed by D-dimer test every week x 4 weeks for monitoring for coagulopathy. Type 2 DM managed with Insulin and Metformin. Last A1c 05/01/20 is 8.1. Nursing with no complaints/concerns. Encouraged ongoing nursing care.  3. Follow up Palliative Care Visit: Palliative care will continue to follow for goals of care clarification.   I spent 30 minutes providing this consultation.  More than 50% of the time in this consultation was spent on coordinating advance care communication.  HISTORY OF PRESENT ILLNESS:[name] L Watsonis a 64 y.o.year oldfemalewith multiple medical problems including bilateral BKA, DMT2, HTN, HLD, history of CVA. Palliative Care was asked to help address goals of care.  CODE STATUS: Full  PPS: 40% HOSPICE ELIGIBILITY/DIAGNOSIS: TBD  PAST MEDICAL HISTORY:  Past Medical History:  Diagnosis Date  . Atypical chest pain    a. non obstructive by cath in 2008 and 2010;  b. 02/2012 Myoview: non-ischemic, EF 57%  .  Cellulitis    a. 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 Chronic kidney disease   . Constipation   . CVA (cerebral infarction)    a.  Small right parietal noted incidentally 04/2007;  b. right sided embolic CVA 123456;  c. 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 Diabetes mellitus, type II ([facility])   . Diabetic retinopathy ([place] [facility])    NPDR w/edema OU  . Diaphragmatic hernia without mention of obstruction or gangrene   . Dry skin   . Esophageal reflux   . HTN (hypertension)   . 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 Hyperlipidemia   . Irritable bowel syndrome   . Morbid obesity ([place])   . Obesity, unspecified   . Pain in shoulder 06/28/2015  . Peripheral neuropathy   . Stroke [facility])    2013-'no residual'  . Syncope and collapse    a. near-syncopal episode in November 2008;  b. 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  . Tobacco abuse   . Vertigo     SOCIAL HX:  Social History   Tobacco Use  . Smoking status: Former Smoker    Packs/day: 0.25    Years: 40.00    Pack years: 10.00    Types: Cigarettes  . Smokeless tobacco: Never Used  . Tobacco comment: has not smoked in about a year  Substance Use Topics  . Alcohol use: Yes    Alcohol/week: 4.0 standard drinks    Types: 4 Cans of beer per week  Comment: occ- foot ball season- 4 beers a week during football season.    ALLERGIES:  Allergies  Allergen Reactions  . Banana Swelling and Other (See Comments)    Facial swelling  . Penicillins Itching, Swelling and Rash    Face swells and break out  . Strawberry Extract Swelling and Other (See Comments)    Facial swelling     PERTINENT MEDICATIONS:  Outpatient Encounter Medications as of 06/21/2019  Medication Sig  . albuterol (VENTOLIN HFA) 108 (90 Base) MCG/ACT inhaler Inhale 2 puffs into the lungs every 4 (four) hours as needed for wheezing or shortness of breath.  Marland Kitchen amLODipine (NORVASC) 2.5 MG  tablet Take 2.5 mg by mouth daily.  Marland Kitchen aspirin EC 81 MG tablet Take 81 mg by mouth daily.  Marland Kitchen atorvastatin (LIPITOR) 20 MG tablet Take 20 mg by mouth daily.  . carvedilol (COREG) 12.5 MG tablet Take 12.5 mg by mouth 2 (two) times daily.   . cetirizine (ZYRTEC) 10 MG tablet Take 10 mg by mouth daily.  . Cholecalciferol (VITAMIN D) 50 MCG (2000 UT) tablet Take 2,000 Units by mouth daily.  . cyanocobalamin 1000 MCG tablet Take 1,000 mcg by mouth daily.  Marland Kitchen docusate sodium 100 MG CAPS Take 100 mg by mouth 2 (two) times daily.  . ferrous sulfate 325 (65 FE) MG tablet Take 325 mg by mouth daily with breakfast.  . gabapentin (NEURONTIN) 100 MG capsule Take 100 mg by mouth See admin instructions. Take with 400 mg morning dose to equal 500 mg in the morning, take with 600 mg night dose to equal 700 mg at night  . gabapentin (NEURONTIN) 400 MG capsule Take 400 mg by mouth 2 (two) times daily.   Marland Kitchen gabapentin (NEURONTIN) 600 MG tablet Take 600 mg by mouth at bedtime.  . hydrALAZINE (APRESOLINE) 25 MG tablet Take 75 mg by mouth 3 (three) times daily.  Marland Kitchen HYDROcodone-acetaminophen (NORCO/VICODIN) 5-325 MG tablet Take 1 tablet by mouth every 8 (eight) hours as needed for moderate pain.   Marland Kitchen insulin lispro (HUMALOG) 100 UNIT/ML injection Inject 2-14 Units into the skin 4 (four) times daily -  with meals and at bedtime. 201-250 = 2 units, 251-300 = 4 units, 301-350= 6 units, 351-400=8 units, 401-450=10 units, 451-500 =12 units >500 = 14 units  . LEVEMIR 100 UNIT/ML injection Inject 53 Units into the skin at bedtime.   Marland Kitchen lisinopril-hydrochlorothiazide (ZESTORETIC) 20-12.5 MG tablet Take 2 tablets by mouth daily.  Marland Kitchen PAZEO 0.7 % SOLN Place 1 drop into both eyes daily.   . polyethylene glycol (MIRALAX / GLYCOLAX) packet Take 17 g by mouth daily as needed for moderate constipation. (Patient taking differently: Take 17 g by mouth daily. )  . SitaGLIPtin-MetFORMIN HCl (JANUMET XR) 50-1000 MG TB24 Take 1 tablet by mouth daily.   . Tetrahydrozoline HCl (VISINE OP) Place 1 drop into both eyes at bedtime.  . vitamin C (ASCORBIC ACID) 500 MG tablet Take 500 mg by mouth daily.   Facility-Administered Encounter Medications as of 06/21/2019  Medication  . aflibercept (EYLEA) SOLN 2 mg  . Bevacizumab (AVASTIN) SOLN 1.25 mg  . Bevacizumab (AVASTIN) SOLN 1.25 mg  . Bevacizumab (AVASTIN) SOLN 1.25 mg  . Bevacizumab (AVASTIN) SOLN 1.25 mg    PHYSICAL EXAM/ROS:   General: NAD Cardiovascular: denied chest pain/discomfort Pulmonary: Denied cough, no SOB, normal respiratory effort; no adventitious lung sounds Extremities: BKA Skin: no rashes/wounds on exposed skin Neurological: Alert and oriented x 4  Teodoro Spray, NP  Teodoro Spray, NP

## 2019-09-30 ENCOUNTER — Other Ambulatory Visit: Payer: Self-pay

## 2019-09-30 ENCOUNTER — Non-Acute Institutional Stay: Payer: Medicare Other | Admitting: Hospice

## 2019-09-30 DIAGNOSIS — G546 Phantom limb syndrome with pain: Secondary | ICD-10-CM

## 2019-09-30 DIAGNOSIS — Z515 Encounter for palliative care: Secondary | ICD-10-CM

## 2019-09-30 NOTE — Progress Notes (Signed)
[facility] Consult Note Telephone: [phone]  Fax: [phone]  PATIENT NAME: [name] DOB: January 02, 1956 MRN: [id number]  PRIMARY CARE PROVIDER:  Dr. Jules Husbands REFERRING PROVIDER: Dr. Jules Husbands RESPONSIBLE PARTY:   Self Contact_ Niece Anna Genre [phone]  RECOMMENDATIONS/PLAN: 1. Advance Care Planning/Goals of Care: Patient affirmed she is a Full code and would want full scope of treatment. MOST form up to date in the chart. Goals include to maximize quality of life and symptom management.She reported she is a COVID-48 survivor and is grateful to be among the living.  Therapeutic listening and validation provided.  Visit consisted of counseling and education dealing with the complex and emotionally intense issues of symptom management and palliative care in the setting of serious and potentially life-threatening illness. Palliative care team will continue to support patient, patient's family, and medical team.  NP called and updated T[place]ka on visit and she expressed gratitude for the update. 2. Symptom management:She denied  pain/discomfort, no shortness of breath; she reported phantom limb pain is well managed with her current medication.  Patient is off Eliquis and weekly D-dimer test for monitoring coagulopathy.Type 2 DM managed with Insulin and Metformin. A1c improving, 7.6 on 08/01/2019, 05/01/20 is 8.1.  Patient in no acute distress. Nursing with no complaints/concerns. Encouraged ongoing nursing care.  3. Follow up Palliative Care Visit: Palliative care will continue to follow for goals of care clarification.  I spent 20minutes providing this consultation; time includes time spent with patient/family, chart review and  documentation.  More than 50% of the time in this consultation was spent on coordinating advance care communication.  HISTORY OF PRESENT ILLNESS:[name] L Watsonis a 64 y.o.year  oldfemalewith multiple medical problems including bilateral BKA, DMT2, HTN, HLD, history of CVA. Palliative Care was asked to help address goals of care.  CODE STATUS: Full  PPS: 40%HOSPICE ELIGIBILITY/DIAGNOSIS: TBD  PAST MEDICAL HISTORY:  Past Medical History:  Diagnosis Date  . Atypical chest pain    a. non obstructive by cath in 2008 and 2010;  b. 02/2012 Myoview: non-ischemic, EF 57%  . Cellulitis    a. 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 Chronic kidney disease   . Constipation   . CVA (cerebral infarction)    a.  Small right parietal noted incidentally 04/2007;  b. right sided embolic CVA 123456;  c. 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 Diabetes mellitus, type II ([place])   . Diabetic retinopathy ([place])    NPDR w/edema OU  . Diaphragmatic hernia without mention of obstruction or gangrene   . Dry skin   . Esophageal reflux   . HTN (hypertension)   . 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 Hyperlipidemia   . Irritable bowel syndrome   . Morbid obesity ([place])   . Obesity, unspecified   . Pain in shoulder 06/28/2015  . Peripheral neuropathy   . Stroke [facility])    2013-'no residual'  . Syncope and collapse    a. near-syncopal episode in November 2008;  b. 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  . Tobacco abuse   . Vertigo     SOCIAL HX:  Social History   Tobacco Use  . Smoking status: Former Smoker    Packs/day: 0.25    Years: 40.00    Pack years: 10.00  Types: Cigarettes  . Smokeless tobacco: Never Used  . Tobacco comment: has not smoked in about a year  Substance Use Topics  . 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    ALLERGIES:  Allergies  Allergen Reactions  . Banana Swelling and Other (See Comments)    Facial swelling  . Penicillins Itching,  Swelling and Rash    Face swells and break out  . Strawberry Extract Swelling and Other (See Comments)    Facial swelling     PERTINENT MEDICATIONS:  Outpatient Encounter Medications as of 09/30/2019  Medication Sig  . albuterol (VENTOLIN HFA) 108 (90 Base) MCG/ACT inhaler Inhale 2 puffs into the lungs every 4 (four) hours as needed for wheezing or shortness of breath.  Marland Kitchen amLODipine (NORVASC) 2.5 MG tablet Take 2.5 mg by mouth daily.  Marland Kitchen aspirin EC 81 MG tablet Take 81 mg by mouth daily.  Marland Kitchen atorvastatin (LIPITOR) 20 MG tablet Take 20 mg by mouth daily.  . carvedilol (COREG) 12.5 MG tablet Take 12.5 mg by mouth 2 (two) times daily.   . cetirizine (ZYRTEC) 10 MG tablet Take 10 mg by mouth daily.  . Cholecalciferol (VITAMIN D) 50 MCG (2000 UT) tablet Take 2,000 Units by mouth daily.  . cyanocobalamin 1000 MCG tablet Take 1,000 mcg by mouth daily.  Marland Kitchen docusate sodium 100 MG CAPS Take 100 mg by mouth 2 (two) times daily.  . ferrous sulfate 325 (65 FE) MG tablet Take 325 mg by mouth daily with breakfast.  . gabapentin (NEURONTIN) 100 MG capsule Take 100 mg by mouth See admin instructions. Take with 400 mg morning dose to equal 500 mg in the morning, take with 600 mg night dose to equal 700 mg at night  . gabapentin (NEURONTIN) 400 MG capsule Take 400 mg by mouth 2 (two) times daily.   Marland Kitchen gabapentin (NEURONTIN) 600 MG tablet Take 600 mg by mouth at bedtime.  . hydrALAZINE (APRESOLINE) 25 MG tablet Take 75 mg by mouth 3 (three) times daily.  Marland Kitchen HYDROcodone-acetaminophen (NORCO/VICODIN) 5-325 MG tablet Take 1 tablet by mouth every 8 (eight) hours as needed for moderate pain.   Marland Kitchen insulin lispro (HUMALOG) 100 UNIT/ML injection Inject 2-14 Units into the skin 4 (four) times daily -  with meals and at bedtime. 201-250 = 2 units, 251-300 = 4 units, 301-350= 6 units, 351-400=8 units, 401-450=10 units, 451-500 =12 units >500 = 14 units  . LEVEMIR 100 UNIT/ML injection Inject 53 Units into the skin at bedtime.     Marland Kitchen lisinopril-hydrochlorothiazide (ZESTORETIC) 20-12.5 MG tablet Take 2 tablets by mouth daily.  Marland Kitchen PAZEO 0.7 % SOLN Place 1 drop into both eyes daily.   . polyethylene glycol (MIRALAX / GLYCOLAX) packet Take 17 g by mouth daily as needed for moderate constipation. (Patient taking differently: Take 17 g by mouth daily. )  . SitaGLIPtin-MetFORMIN HCl (JANUMET XR) 50-1000 MG TB24 Take 1 tablet by mouth daily.  . Tetrahydrozoline HCl (VISINE OP) Place 1 drop into both eyes at bedtime.  . vitamin C (ASCORBIC ACID) 500 MG tablet Take 500 mg by mouth daily.   Facility-Administered Encounter Medications as of 09/30/2019  Medication  . aflibercept (EYLEA) SOLN 2 mg  . Bevacizumab (AVASTIN) SOLN 1.25 mg  . Bevacizumab (AVASTIN) SOLN 1.25 mg  . Bevacizumab (AVASTIN) SOLN 1.25 mg  . Bevacizumab (  AVASTIN) SOLN 1.25 mg    PHYSICAL EXAM/ROS:   General: NAD, cooperative Cardiovascular:denied chest pain/discomfort Pulmonary:Denied cough, no SOB, normal respiratory effort; no adventitious lung sounds Extremities:BKA Skin: no rashes/wounds on exposed skin Neurological: Alert and oriented x 4  Teodoro Spray, NP

## 2019-11-03 ENCOUNTER — Other Ambulatory Visit: Payer: Self-pay | Admitting: Family Medicine

## 2019-11-03 DIAGNOSIS — N6489 Other specified disorders of breast: Secondary | ICD-10-CM

## 2019-11-15 ENCOUNTER — Other Ambulatory Visit: Payer: Medicare Other

## 2019-12-02 ENCOUNTER — Inpatient Hospital Stay: Admission: RE | Admit: 2019-12-02 | Payer: Medicare Other | Source: Ambulatory Visit

## 2019-12-02 ENCOUNTER — Other Ambulatory Visit: Payer: Self-pay | Admitting: Internal Medicine

## 2019-12-02 DIAGNOSIS — N6489 Other specified disorders of breast: Secondary | ICD-10-CM

## 2019-12-09 ENCOUNTER — Non-Acute Institutional Stay: Payer: Medicare Other | Admitting: Hospice

## 2019-12-09 ENCOUNTER — Other Ambulatory Visit: Payer: Self-pay

## 2019-12-09 DIAGNOSIS — G546 Phantom limb syndrome with pain: Secondary | ICD-10-CM

## 2019-12-09 DIAGNOSIS — Z515 Encounter for palliative care: Secondary | ICD-10-CM

## 2019-12-09 NOTE — Progress Notes (Signed)
[facility] Consult Note Telephone: [phone]  Fax: [phone] PATIENT NAME: [name] DOB: April 21, 1956 MRN: [id number]  PRIMARY CARE PROVIDER:  Dr. Jules Husbands REFERRING PROVIDER: Dr. Jules Husbands RESPONSIBLE PARTY:Self Contact_ Niece Anna Genre [phone]  RECOMMENDATIONS/PLAN: 1. Advance Care Planning/Goals of Care:Visit to build trust and follow up on palliative care. Patient is a Full code and would want full scope of treatment. MOST form up to date in the chart. Goals include to maximize quality of life and symptom management.Visit consisted of counseling and education dealing with the complex and emotionally intense issues of symptom management and palliative care in the setting of serious and potentially life-threatening illness. Palliative care team will continue to support patient, patient's family, and medical team.  2. Symptom management:No significant changes since last visit. She deniedpain/discomfort, no shortness of breath; no phantom limb pain.  Patient is off Eliquis and weekly D-dimer test for monitoring coagulopathy.Type 2 DM managed with Insulin and Metformin. Last A1c 7.6 on 08/01/2019.  Patient in no acute distress.Nursing with no complaints/concerns. Nit manager [place] with no concerns.  Encouraged ongoing nursing care. 3. Follow up Palliative Care Visit: Palliative care will continue to follow for goals of care clarification.  I spent 71minutes providing this consultation; time includes time spent with patient/family, chart review and  documentation.More than 50% of the time in this consultation was spent on coordinating advance care communication.  HISTORY OF PRESENT ILLNESS:[name] L Watsonis a 64 y.o.year oldfemalewith multiple medical problems including bilateral BKA, DMT2, HTN, HLD, history of CVA. Palliative Care was asked to help address goals of care.  CODE  STATUS:Full  PPS:40% HOSPICE ELIGIBILITY/DIAGNOSIS: TBD  PAST MEDICAL HISTORY:  Past Medical History:  Diagnosis Date   Atypical chest pain    a. non obstructive by cath in 2008 and 2010;  b. 02/2012 Myoview: non-ischemic, EF 57%   Cellulitis    a. left foot-> s/p L BKA   Chronic kidney disease    Constipation    CVA (cerebral infarction)    a.  Small right parietal noted incidentally 04/2007;  b. right sided embolic CVA 07/3543;  c. 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 near-syncopal episode in November 2008;  b. 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   Tobacco abuse    Vertigo     SOCIAL HX:  Social History   Tobacco Use   Smoking status: Former Smoker    Packs/day: 0.25    Years: 40.00    Pack years: 10.00    Types: Cigarettes   Smokeless tobacco: Never Used   Tobacco comment: has not smoked in about a year  Substance Use Topics   Alcohol use: Yes    Alcohol/week: 4.0 standard drinks  Types: 4 Cans of beer per week    Comment: occ- foot ball season- 4 beers a week during football season.    ALLERGIES:  Allergies  Allergen Reactions   Banana Swelling and Other (See Comments)    Facial swelling   Penicillins Itching, Swelling and Rash    Face swells and break out   Strawberry Extract Swelling and Other (See Comments)    Facial swelling     PERTINENT MEDICATIONS:   Outpatient Encounter Medications as of 12/09/2019  Medication Sig   albuterol (VENTOLIN HFA) 108 (90 Base) MCG/ACT inhaler Inhale 2 puffs into the lungs every 4 (four) hours as needed for wheezing or shortness of breath.   amLODipine (NORVASC) 2.5 MG tablet Take 2.5 mg by mouth daily.   aspirin EC 81 MG tablet Take 81 mg by mouth daily.   atorvastatin (LIPITOR) 20 MG tablet Take 20 mg by mouth daily.   carvedilol (COREG) 12.5 MG tablet Take 12.5 mg by mouth 2 (two) times daily.    cetirizine (ZYRTEC) 10 MG tablet Take 10 mg by mouth daily.   Cholecalciferol (VITAMIN D) 50 MCG (2000 UT) tablet Take 2,000 Units by mouth daily.   cyanocobalamin 1000 MCG tablet Take 1,000 mcg by mouth daily.   docusate sodium 100 MG CAPS Take 100 mg by mouth 2 (two) times daily.   ferrous sulfate 325 (65 FE) MG tablet Take 325 mg by mouth daily with breakfast.   gabapentin (NEURONTIN) 100 MG capsule Take 100 mg by mouth See admin instructions. Take with 400 mg morning dose to equal 500 mg in the morning, take with 600 mg night dose to equal 700 mg at night   gabapentin (NEURONTIN) 400 MG capsule Take 400 mg by mouth 2 (two) times daily.    gabapentin (NEURONTIN) 600 MG tablet Take 600 mg by mouth at bedtime.   hydrALAZINE (APRESOLINE) 25 MG tablet Take 75 mg by mouth 3 (three) times daily.   HYDROcodone-acetaminophen (NORCO/VICODIN) 5-325 MG tablet Take 1 tablet by mouth every 8 (eight) hours as needed for moderate pain.    insulin lispro (HUMALOG) 100 UNIT/ML injection Inject 2-14 Units into the skin 4 (four) times daily -  with meals and at bedtime. 201-250 = 2 units, 251-300 = 4 units, 301-350= 6 units, 351-400=8 units, 401-450=10 units, 451-500 =12 units >500 = 14 units   LEVEMIR 100 UNIT/ML injection Inject 53 Units into the skin at bedtime.    lisinopril-hydrochlorothiazide (ZESTORETIC) 20-12.5 MG tablet Take 2 tablets by mouth daily.   PAZEO 0.7 % SOLN Place 1 drop into both eyes daily.     polyethylene glycol (MIRALAX / GLYCOLAX) packet Take 17 g by mouth daily as needed for moderate constipation. (Patient taking differently: Take 17 g by mouth daily. )   SitaGLIPtin-MetFORMIN HCl (JANUMET XR) 50-1000 MG TB24 Take 1 tablet by mouth daily.   Tetrahydrozoline HCl (VISINE OP) Place 1 drop into both eyes at bedtime.   vitamin C (ASCORBIC ACID) 500 MG tablet Take 500 mg by mouth daily.   Facility-Administered Encounter Medications as of 12/09/2019  Medication   aflibercept (EYLEA) SOLN 2 mg   Bevacizumab (AVASTIN) SOLN 1.25 mg   Bevacizumab (AVASTIN) SOLN 1.25 mg   Bevacizumab (AVASTIN) SOLN 1.25 mg   Bevacizumab (AVASTIN) SOLN 1.25 mg    PHYSICAL EXAM/ROS:  General: NAD, cooperative Cardiovascular:denied chest pain/discomfort Pulmonary:Denied cough, no SOB, normal respiratory effort; no adventitious lung sounds Extremities:BKA Skin: no rashes/wounds on exposed skin Neurological: Alert and oriented x  [place], NP

## 2020-01-17 ENCOUNTER — Ambulatory Visit
Admission: RE | Admit: 2020-01-17 | Discharge: 2020-01-17 | Disposition: A | Discharge status: Home / Self Care | Payer: Medicare Other | Source: Ambulatory Visit | Attending: Internal Medicine | Admitting: Internal Medicine

## 2020-01-17 ENCOUNTER — Other Ambulatory Visit: Payer: Self-pay | Admitting: Internal Medicine

## 2020-01-17 ENCOUNTER — Other Ambulatory Visit: Payer: Self-pay

## 2020-01-17 DIAGNOSIS — N6489 Other specified disorders of breast: Secondary | ICD-10-CM

## 2020-03-13 ENCOUNTER — Other Ambulatory Visit: Payer: Self-pay

## 2020-03-13 ENCOUNTER — Non-Acute Institutional Stay: Payer: Medicare Other | Admitting: Hospice

## 2020-03-13 DIAGNOSIS — G546 Phantom limb syndrome with pain: Secondary | ICD-10-CM

## 2020-03-13 DIAGNOSIS — Z515 Encounter for palliative care: Secondary | ICD-10-CM

## 2020-03-13 NOTE — Progress Notes (Signed)
[place] Consult Note Telephone: [phone]  Fax: [phone]  PATIENT NAME: [name] DOB: Oct 10, 1955 MRN: [id number]  PRIMARY CARE PROVIDER:Dr. Jules Husbands REFERRING PROVIDER:Dr. Jules Husbands RESPONSIBLE PARTY:Self Contact_ Niece Anna Genre [phone]  RECOMMENDATIONS/PLAN: 1. Advance Care Planning/CODE STATUS:Visit to build trust and follow up on palliative care. Code status reviewed today. Patient affirmed she is a  Full code and would want full scope of treatment. MOST form up to date in the chart.  Goals of Care: Goals include to maximize quality of life and symptom management. Visit consisted of counseling and education dealing with the complex and emotionally intense issues of symptom management and palliative care in the setting of serious and potentially life-threatening illness. Palliative care team will continue to support patient, patient's family, and medical team.  2. Symptom management: Type 2 DM well managed with Insulin and Metformin. Last A1c 6.5 on 02/01/2020. Patient denied pain/discomfort, no phantom lib pain. She continues on No concentrated sweets diet, regular texture; on Insulin and Metformin. She is in no acute distress.Nursing with no complaints/concerns.  Encouraged ongoing nursing care. 3. Follow up Palliative Care Visit: Palliative care will continue to follow for goals of care clarification.  I spent75minutes providing this consultation; time includestime spent with patient/family, chart reviewanddocumentation.More than 50% of the time in this consultation was spent on coordinating advance care communication.  HISTORY OF PRESENT ILLNESS:[name] L Watsonis a 64 y.o.year oldfemalewith multiple medical problems including bilateral BKA, DMT2, HTN, HLD, history of CVA. Palliative Care was asked to help address goals of care.  CODE STATUS:Full  PPS:40%  HOSPICE  ELIGIBILITY/DIAGNOSIS: TBD  PAST MEDICAL HISTORY:  Past Medical History:  Diagnosis Date  . Atypical chest pain    a. non obstructive by cath in 2008 and 2010;  b. 02/2012 Myoview: non-ischemic, EF 57%  . Cellulitis    a. 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 Chronic kidney disease   . Constipation   . CVA (cerebral infarction)    a.  Small right parietal noted incidentally 04/2007;  b. right sided embolic CVA 09/4560;  c. 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 Diabetes mellitus, type II ([place])   . Diabetic retinopathy ([place])    NPDR w/edema OU  . Diaphragmatic hernia without mention of obstruction or gangrene   . Dry skin   . Esophageal reflux   . HTN (hypertension)   . 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 Hyperlipidemia   . Irritable bowel syndrome   . Morbid obesity ([place])   . Obesity, unspecified   . Pain in shoulder 06/28/2015  . Peripheral neuropathy   . Stroke [facility])    2013-'no residual'  . Syncope and collapse    a. near-syncopal episode in November 2008;  b. 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  . Tobacco abuse   . Vertigo     SOCIAL HX:  Social History   Tobacco Use  . Smoking status: Former Smoker    Packs/day: 0.25    Years: 40.00    Pack years: 10.00    Types: Cigarettes  . Smokeless tobacco: Never Used  . Tobacco comment: has not smoked in about a year  Substance Use Topics  . Alcohol use: Yes    Alcohol/week: 4.0 standard drinks    Types: 4 Cans of beer per  week    Comment: occ- foot ball season- 4 beers a week during football season.    ALLERGIES:  Allergies  Allergen Reactions  . Banana Swelling and Other (See Comments)    Facial swelling  . Penicillins Itching, Swelling and Rash    Face swells and break out  . Strawberry Extract Swelling and Other (See Comments)    Facial swelling     PERTINENT MEDICATIONS:  Outpatient Encounter Medications as of  03/13/2020  Medication Sig  . albuterol (VENTOLIN HFA) 108 (90 Base) MCG/ACT inhaler Inhale 2 puffs into the lungs every 4 (four) hours as needed for wheezing or shortness of breath.  Marland Kitchen amLODipine (NORVASC) 2.5 MG tablet Take 2.5 mg by mouth daily.  Marland Kitchen aspirin EC 81 MG tablet Take 81 mg by mouth daily.  Marland Kitchen atorvastatin (LIPITOR) 20 MG tablet Take 20 mg by mouth daily.  . carvedilol (COREG) 12.5 MG tablet Take 12.5 mg by mouth 2 (two) times daily.   . cetirizine (ZYRTEC) 10 MG tablet Take 10 mg by mouth daily.  . Cholecalciferol (VITAMIN D) 50 MCG (2000 UT) tablet Take 2,000 Units by mouth daily.  . cyanocobalamin 1000 MCG tablet Take 1,000 mcg by mouth daily.  Marland Kitchen docusate sodium 100 MG CAPS Take 100 mg by mouth 2 (two) times daily.  . ferrous sulfate 325 (65 FE) MG tablet Take 325 mg by mouth daily with breakfast.  . gabapentin (NEURONTIN) 100 MG capsule Take 100 mg by mouth See admin instructions. Take with 400 mg morning dose to equal 500 mg in the morning, take with 600 mg night dose to equal 700 mg at night  . gabapentin (NEURONTIN) 400 MG capsule Take 400 mg by mouth 2 (two) times daily.   Marland Kitchen gabapentin (NEURONTIN) 600 MG tablet Take 600 mg by mouth at bedtime.  . hydrALAZINE (APRESOLINE) 25 MG tablet Take 75 mg by mouth 3 (three) times daily.  Marland Kitchen HYDROcodone-acetaminophen (NORCO/VICODIN) 5-325 MG tablet Take 1 tablet by mouth every 8 (eight) hours as needed for moderate pain.   Marland Kitchen insulin lispro (HUMALOG) 100 UNIT/ML injection Inject 2-14 Units into the skin 4 (four) times daily -  with meals and at bedtime. 201-250 = 2 units, 251-300 = 4 units, 301-350= 6 units, 351-400=8 units, 401-450=10 units, 451-500 =12 units >500 = 14 units  . LEVEMIR 100 UNIT/ML injection Inject 53 Units into the skin at bedtime.   Marland Kitchen lisinopril-hydrochlorothiazide (ZESTORETIC) 20-12.5 MG tablet Take 2 tablets by mouth daily.  Marland Kitchen PAZEO 0.7 % SOLN Place 1 drop into both eyes daily.   . polyethylene glycol (MIRALAX /  GLYCOLAX) packet Take 17 g by mouth daily as needed for moderate constipation. (Patient taking differently: Take 17 g by mouth daily. )  . SitaGLIPtin-MetFORMIN HCl (JANUMET XR) 50-1000 MG TB24 Take 1 tablet by mouth daily.  . Tetrahydrozoline HCl (VISINE OP) Place 1 drop into both eyes at bedtime.  . vitamin C (ASCORBIC ACID) 500 MG tablet Take 500 mg by mouth daily.   Facility-Administered Encounter Medications as of 03/13/2020  Medication  . aflibercept (EYLEA) SOLN 2 mg  . Bevacizumab (AVASTIN) SOLN 1.25 mg  . Bevacizumab (AVASTIN) SOLN 1.25 mg  . Bevacizumab (AVASTIN) SOLN 1.25 mg  . Bevacizumab (AVASTIN) SOLN 1.25 mg    PHYSICAL EXAM/ROS:  General: NAD, cooperative Cardiovascular:denied chest pain/discomfort Pulmonary:Denied cough, no SOB, normal respiratory effort; no adventitious lung sounds Extremities:bilateral BKA Skin: no rashes/wounds on exposed skin Neurological: Alert and oriented x 4  Halo Shevlin I Maryagnes Carrasco,  NP

## 2020-08-17 ENCOUNTER — Non-Acute Institutional Stay: Payer: Medicare Other | Admitting: Hospice

## 2020-08-17 ENCOUNTER — Other Ambulatory Visit: Payer: Self-pay

## 2020-08-17 DIAGNOSIS — N1831 Chronic kidney disease, stage 3a: Secondary | ICD-10-CM

## 2020-08-17 DIAGNOSIS — Z515 Encounter for palliative care: Secondary | ICD-10-CM

## 2020-08-17 NOTE — Progress Notes (Signed)
[place] Consult Note Telephone: [phone]  Fax: [phone]  PATIENT NAME: [name] DOB: 08/19/1955 MRN: [id number]  PRIMARY CARE PROVIDER:   Duffy Bruce, Manya Silvas, MD Earlyne Iba, MD [place],  [place] [address]   PRIMARY CARE PROVIDER:Dr. Jules Husbands REFERRING PROVIDER:Dr. Jules Husbands RESPONSIBLE PARTY:Self Contact_ Niece Anna Genre [phone]    Visit is to build trust and highlight Palliative Medicine as specialized medical care for people living with serious illness, aimed at facilitating better quality of life through symptoms relief, assisting with advance care plan and establishing goals of care.   CHIEF COMPLAINT: Palliative follow up visit/type 2 diabetes mellitus  RECOMMENDATIONS/PLAN:   1. Advance Care Planning/Code Status: CODE STATUS reviewed today.  Patient affirmed she is a full code.  She verbalized understanding that she can change her mind on her CODE STATUS anytime.  2. Goals of Care: Goals of care include to maximize quality of life and symptom management.  Visit consisted of counseling and education dealing with the complex and emotionally intense issues of symptom management and palliative care in the setting of serious and potentially life-threatening illness. Palliative care team will continue to support patient, patient's family, and medical team.  I spent 16 minutes providing this consultation. More than 50% of the time in this consultation was spent on coordinating communication.  -------------------------------------------------------------------------------------------------------------------------------------------------- 3. Symptom management/Plan:  Type 2 diabetes mellitus well managed.  Last A1c 07/20/2020 was 6.6.  Continue insulin as currently ordered.  Continue current diet, no concentrated sweets.  Monitor for hypoglycemic/hyperglycemic  events and notify provider accordingly. 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 Palliative will continue to monitor for symptom management/decline and make recommendations as needed. Return 2 months or prn. Encouraged to call provider sooner with any concerns.   HISTORY OF PRESENT ILLNESS:  [name] is a 65 y.o. female with multiple medical problems including type 2 diabetes mellitus, chronic, insulin-dependent with renal complication and retinopathy.  History of BKA,  HTN, CKD 3, HLD,  CVA. History obtained from review of EMR, discussion with primary team, and  interview with family, caregiver  and/or patient. Records reviewed and summarized above. All 10 point systems reviewed and are negative except as documented in history of present illness above  Review and summarization of Epic records shows history from other than patient.   Palliative Care was asked to follow this patient by consultation request of Dr. Jules Husbands to help address complex decision making in the context of advance care planning and goals of care clarification.   CODE STATUS: Full  PPS: 40%  HOSPICE ELIGIBILITY/DIAGNOSIS: TBD  PAST MEDICAL HISTORY:  Past Medical History:  Diagnosis Date  . Atypical chest pain    a. non obstructive by cath in 2008 and 2010;  b. 02/2012 Myoview: non-ischemic, EF 57%  . Cellulitis    a. 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 Chronic kidney disease   . Constipation   . CVA (cerebral infarction)    a.  Small right parietal noted incidentally 04/2007;  b. right sided embolic CVA 07/2438;  c. 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 Diabetes mellitus, type II ([place])   . Diabetic retinopathy ([place])    NPDR w/edema OU  . Diaphragmatic hernia without mention of obstruction or gangrene   .  Dry skin   . Esophageal reflux   . HTN (hypertension)   . Hx of BKA  (HCC)    Bilateral  . Hyperlipidemia   . Irritable bowel syndrome   . Morbid obesity ([place])   . Obesity, unspecified   . Pain in shoulder 06/28/2015  . Peripheral neuropathy   . Stroke [facility])    2013-'no residual'  . Syncope and collapse    a. near-syncopal episode in November 2008;  b. 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  . Tobacco abuse   . Vertigo      SOCIAL HX: @SOCX  Patient lives at SNF for ongoing care   FAMILY HX:  Family History  Problem Relation Age of Onset  . Pneumonia Mother   . Gallbladder disease Mother        cancer  . Heart failure Mother   . Diabetes Father   . Coronary artery disease Father   . Stroke Neg Hx     Review lab tests/diagnostics No results for input(s): WBC, HGB, HCT, PLT, MCV in the last 168 hours. No results for input(s): NA, K, CL, CO2, BUN, CREATININE, GLUCOSE in the last 168 hours. Latest GFR by Cockcroft Gault (not valid in AKI or ESRD) CrCl cannot be calculated (Patient's most recent lab result is older than the maximum 21 days allowed.).  ALLERGIES:  Allergies  Allergen Reactions  . Banana Swelling and Other (See Comments)    Facial swelling  . Penicillins Itching, Swelling and Rash    Face swells and break out  . Strawberry Extract Swelling and Other (See Comments)    Facial swelling      PERTINENT MEDICATIONS:  Outpatient Encounter Medications as of 08/17/2020  Medication Sig  . albuterol (VENTOLIN HFA) 108 (90 Base) MCG/ACT inhaler Inhale 2 puffs into the lungs every 4 (four) hours as needed for wheezing or shortness of breath.  Marland Kitchen amLODipine (NORVASC) 2.5 MG tablet Take 2.5 mg by mouth daily.  Marland Kitchen aspirin EC 81 MG tablet Take 81 mg by mouth daily.  Marland Kitchen atorvastatin (LIPITOR) 20 MG tablet Take 20 mg by mouth daily.  . carvedilol (COREG) 12.5 MG tablet Take 12.5 mg by mouth 2 (two) times daily.   . cetirizine (ZYRTEC) 10 MG tablet Take 10 mg by mouth daily.  . Cholecalciferol (VITAMIN D) 50 MCG (2000 UT) tablet Take 2,000  Units by mouth daily.  . cyanocobalamin 1000 MCG tablet Take 1,000 mcg by mouth daily.  Marland Kitchen docusate sodium 100 MG CAPS Take 100 mg by mouth 2 (two) times daily.  . ferrous sulfate 325 (65 FE) MG tablet Take 325 mg by mouth daily with breakfast.  . gabapentin (NEURONTIN) 100 MG capsule Take 100 mg by mouth See admin instructions. Take with 400 mg morning dose to equal 500 mg in the morning, take with 600 mg night dose to equal 700 mg at night  . gabapentin (NEURONTIN) 400 MG capsule Take 400 mg by mouth 2 (two) times daily.   Marland Kitchen gabapentin (NEURONTIN) 600 MG tablet Take 600 mg by mouth at bedtime.  . hydrALAZINE (APRESOLINE) 25 MG tablet Take 75 mg by mouth 3 (three) times daily.  Marland Kitchen HYDROcodone-acetaminophen (NORCO/VICODIN) 5-325 MG tablet Take 1 tablet by mouth every 8 (eight) hours as needed for moderate pain.   Marland Kitchen insulin lispro (HUMALOG) 100 UNIT/ML injection Inject 2-14 Units into the skin 4 (four) times daily -  with meals and at bedtime. 201-250 = 2 units, 251-300 = 4 units, 301-350= 6 units, 351-400=8 units, 401-450=10  units, 451-500 =12 units >500 = 14 units  . LEVEMIR 100 UNIT/ML injection Inject 53 Units into the skin at bedtime.   Marland Kitchen lisinopril-hydrochlorothiazide (ZESTORETIC) 20-12.5 MG tablet Take 2 tablets by mouth daily.  Marland Kitchen PAZEO 0.7 % SOLN Place 1 drop into both eyes daily.   . polyethylene glycol (MIRALAX / GLYCOLAX) packet Take 17 g by mouth daily as needed for moderate constipation. (Patient taking differently: Take 17 g by mouth daily. )  . SitaGLIPtin-MetFORMIN HCl (JANUMET XR) 50-1000 MG TB24 Take 1 tablet by mouth daily.  . Tetrahydrozoline HCl (VISINE OP) Place 1 drop into both eyes at bedtime.  . vitamin C (ASCORBIC ACID) 500 MG tablet Take 500 mg by mouth daily.   Facility-Administered Encounter Medications as of 08/17/2020  Medication  . aflibercept (EYLEA) SOLN 2 mg  . Bevacizumab (AVASTIN) SOLN 1.25 mg  . Bevacizumab (AVASTIN) SOLN 1.25 mg  . Bevacizumab (AVASTIN) SOLN  1.25 mg  . Bevacizumab (AVASTIN) SOLN 1.25 mg     PHYSICAL EXAM  General: In no acute distress,  Cardiovascular: regular rate and rhythm Pulmonary: no cough, no increased work of breathing, normal respiratory effort Abdomen: soft, non tender, positive bowel sounds in all quadrants GU:  no suprapubic tenderness Eyes: Normal lids, no discharge, sclera anicteric ENMT: Moist mucous membranes Musculoskeletal: Bilateral AKA Skin: no rash to visible skin, warm without cyanosis Psych: non-anxious affect Neurological: Weakness but otherwise non focal, alert and oriented x4 Heme/lymph/immuno: no bruises, no bleeding   Thank you for the opportunity to participate in the care of ARYIAH MONTEROSSO Please call our office at [phone] if we can be of additional assistance.  Note: Portions of this note were generated with Lobbyist. Dictation errors may occur despite best attempts at proofreading.  Teodoro Spray, NP

## 2020-11-16 ENCOUNTER — Other Ambulatory Visit: Payer: Self-pay

## 2020-11-16 ENCOUNTER — Non-Acute Institutional Stay: Payer: Medicare Other | Admitting: Hospice

## 2020-11-16 DIAGNOSIS — I10 Essential (primary) hypertension: Secondary | ICD-10-CM

## 2020-11-16 DIAGNOSIS — E11319 Type 2 diabetes mellitus with unspecified diabetic retinopathy without macular edema: Secondary | ICD-10-CM

## 2020-11-16 DIAGNOSIS — G546 Phantom limb syndrome with pain: Secondary | ICD-10-CM

## 2020-11-16 DIAGNOSIS — Z515 Encounter for palliative care: Secondary | ICD-10-CM

## 2020-11-16 NOTE — Progress Notes (Signed)
[name] Consult Note Telephone: [phone]  Fax: [phone]  PATIENT NAME: [name] DOB: 1955/06/23 MRN: [id number]  PRIMARY CARE PROVIDER:  Dr. Jules Husbands REFERRING PROVIDER: Dr. Jules Husbands RESPONSIBLE PARTY:   Self Contact_ Niece Anna Genre [address]     Name Relation Home Work Mobile   [place] Niece [phone]    Edwinna Areola [phone]     Katherinne, [place] [phone]         Visit is to build trust and highlight Palliative Medicine as specialized medical care for people living with serious illness, aimed at facilitating better quality of life through symptoms relief, assisting with advance care planning and complex medical decision making. This is a follow up visit.  RECOMMENDATIONS/PLAN:   Advance Care Planning/Code Status: Patient is an Full code  Goals of Care: Goals of care include to maximize quality of life and symptom management.  Visit consisted of counseling and education dealing with the complex and emotionally intense issues of symptom management and palliative care in the setting of serious and potentially life-threatening illness. Palliative care team will continue to support patient, patient's family, and medical team.  Symptom management/Plan:  HTN: Managed with Novarsc, Cozaar, Catapress. Insomnia: Meltaonin. Sleep hygiene discussed.  Type 2 diabetes mellitus well managed.  Last A1c 07/20/2020 was 6.6.  Continue insulin as currently ordered.  Continue current diet, no concentrated sweets.  Monitor for hypoglycemic/hyperglycemic events and notify provider accordingly. Repeat A1c in three months.  Phantom limb pain: Managed with Gabapentin Follow up: Palliative care will continue to follow for complex medical decision making, advance care planning, and clarification of goals. Return 6 weeks or prn. Encouraged to call provider  sooner with any concerns.  CHIEF COMPLAINT: Palliative follow up  HISTORY OF PRESENT ILLNESS: [name] is a 65 y.o. female with multiple medical problems including  HTN, type 2 diabetes mellitus, chronic, insulin-dependent with renal complication and retinopathy.  History of BKA, CKD 3, HLD,  CVA. No fall or hospitalization since last visit. Patient denies pain/discomfort. History obtained from review of EMR, discussion with primary team, family and/or patient. Records reviewed and summarized above. All 10 point systems reviewed and are negative.   Review and summarization of Epic records shows history from other than patient.   Palliative Care was asked to follow this patient of help address complex decision making in the context of advance care planning and goals of care clarification.   PHYSICAL EXAM  General: In no acute distress,cooperative Cardiovascular: regular rate and rhythm Pulmonary: no cough, no increased work of breathing, normal respiratory effort Abdomen: soft, non tender, positive bowel sounds in all quadrants GU:  no suprapubic tenderness Eyes: Normal lids, no discharge, sclera anicteric ENMT: Moist mucous membranes Musculoskeletal: Bilateral AKA Skin: no rash to visible skin, warm without cyanosis Psych: non-anxious affect Neurological: Weakness but otherwise non focal, alert and oriented x4 Heme/lymph/immuno: no bruises, no bleeding  PERTINENT MEDICATIONS:  Outpatient Encounter Medications as of 11/16/2020  Medication Sig   albuterol (VENTOLIN HFA) 108 (90 Base) MCG/ACT inhaler Inhale 2 puffs into the lungs every 4 (four) hours as needed for wheezing or shortness of breath.   amLODipine (NORVASC) 2.5 MG tablet Take 2.5 mg by mouth daily.   aspirin EC 81 MG tablet Take 81 mg by mouth daily.   atorvastatin (LIPITOR) 20 MG tablet Take 20 mg by mouth daily.   carvedilol (COREG) 12.5 MG tablet Take 12.5 mg  by mouth 2 (two) times daily.    cetirizine (ZYRTEC) 10 MG  tablet Take 10 mg by mouth daily.   Cholecalciferol (VITAMIN D) 50 MCG (2000 UT) tablet Take 2,000 Units by mouth daily.   cyanocobalamin 1000 MCG tablet Take 1,000 mcg by mouth daily.   docusate sodium 100 MG CAPS Take 100 mg by mouth 2 (two) times daily.   ferrous sulfate 325 (65 FE) MG tablet Take 325 mg by mouth daily with breakfast.   gabapentin (NEURONTIN) 100 MG capsule Take 100 mg by mouth See admin instructions. Take with 400 mg morning dose to equal 500 mg in the morning, take with 600 mg night dose to equal 700 mg at night   gabapentin (NEURONTIN) 400 MG capsule Take 400 mg by mouth 2 (two) times daily.    gabapentin (NEURONTIN) 600 MG tablet Take 600 mg by mouth at bedtime.   hydrALAZINE (APRESOLINE) 25 MG tablet Take 75 mg by mouth 3 (three) times daily.   HYDROcodone-acetaminophen (NORCO/VICODIN) 5-325 MG tablet Take 1 tablet by mouth every 8 (eight) hours as needed for moderate pain.    insulin lispro (HUMALOG) 100 UNIT/ML injection Inject 2-14 Units into the skin 4 (four) times daily -  with meals and at bedtime. 201-250 = 2 units, 251-300 = 4 units, 301-350= 6 units, 351-400=8 units, 401-450=10 units, 451-500 =12 units >500 = 14 units   LEVEMIR 100 UNIT/ML injection Inject 53 Units into the skin at bedtime.    lisinopril-hydrochlorothiazide (ZESTORETIC) 20-12.5 MG tablet Take 2 tablets by mouth daily.   PAZEO 0.7 % SOLN Place 1 drop into both eyes daily.    polyethylene glycol (MIRALAX / GLYCOLAX) packet Take 17 g by mouth daily as needed for moderate constipation. (Patient taking differently: Take 17 g by mouth daily. )   SitaGLIPtin-MetFORMIN HCl (JANUMET XR) 50-1000 MG TB24 Take 1 tablet by mouth daily.   Tetrahydrozoline HCl (VISINE OP) Place 1 drop into both eyes at bedtime.   vitamin C (ASCORBIC ACID) 500 MG tablet Take 500 mg by mouth daily.   Facility-Administered Encounter Medications as of 11/16/2020  Medication   aflibercept (EYLEA) SOLN 2 mg   Bevacizumab (AVASTIN)  SOLN 1.25 mg   Bevacizumab (AVASTIN) SOLN 1.25 mg   Bevacizumab (AVASTIN) SOLN 1.25 mg   Bevacizumab (AVASTIN) SOLN 1.25 mg    HOSPICE ELIGIBILITY/DIAGNOSIS: TBD  PAST MEDICAL HISTORY:  Past Medical History:  Diagnosis Date   Atypical chest pain    a. non obstructive by cath in 2008 and 2010;  b. 02/2012 Myoview: non-ischemic, EF 57%   Cellulitis    a. left foot-> s/p L BKA   Chronic kidney disease    Constipation    CVA (cerebral infarction)    a.  Small right parietal noted incidentally 04/2007;  b. right sided embolic CVA 0/6237;  c. TEE 2/14:  LVH, EF 55-60%, mild LAE, no LAA clot, no PFO, no R->L shunt by echo contrast, oscillating density on AV likely Lambl's Excressence    Diabetes mellitus, type II (HCC)    Diabetic retinopathy ([place])    NPDR w/edema OU   Diaphragmatic hernia without mention of obstruction or gangrene    Dry skin    Esophageal reflux    HTN (hypertension)    Hx of BKA (HCC)    Bilateral   Hyperlipidemia    Irritable bowel syndrome    Morbid obesity (HCC)    Obesity, unspecified    Pain in shoulder 06/28/2015   Peripheral neuropathy  Stroke [facility])    2013-'no residual'   Syncope and collapse    a. near-syncopal episode in November 2008;  b. 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   Tobacco abuse    Vertigo      ALLERGIES:  Allergies  Allergen Reactions   Banana Swelling and Other (See Comments)    Facial swelling   Penicillins Itching, Swelling and Rash    Face swells and break out   Strawberry Extract Swelling and Other (See Comments)    Facial swelling      I spent  50 minutes providing this consultation; this includes time spent with patient/family, chart review and documentation. More than 50% of the time in this consultation was spent on counseling and coordinating communication   Thank you for the opportunity to participate in the care of [name] Please call our office at [phone] if we can be of additional  assistance.  Note: Portions of this note were generated with Lobbyist. Dictation errors may occur despite best attempts at proofreading.  Teodoro Spray, NP

## 2020-12-13 NOTE — Progress Notes (Signed)
HEMATOLOGY/ONCOLOGY CONSULTATION NOTE  Date of Service: 12/13/2020  Patient Care Team: Durenda Hurt, Flossie Buffy, MD as PCP - General (Internal Medicine) Daleen Squibb, Jesse Sans, MD (Inactive) (Cardiology)  CHIEF COMPLAINTS/PURPOSE OF CONSULTATION:  Normocytic Anemia  HISTORY OF PRESENTING ILLNESS:   [name] is a wonderful 65 y.o. female who has previously been seen by Dr. Melton Alar for evaluation and management of normocytic anemia. The pt reports that she is doing well overall.  The pt reports that she had a Colonoscopy and an Upper Endoscopy last week. She denies any major medication changes since her last visit. She has felt the same in the last year and has no new concerns. Pt quit smoking over a year ago and does not drink much alcohol. Pt is currently at [facility], a skilled nursing facility. She has not been vitamin deficient to her knowledge. Pt reports that her diabetes has been well-controlled and denies any kidney dysfunction due to diabetes. She has been anemic for years and does now know the cause. She was not having frequent infections prior to her initial diagnosis of anemia. She believes that her mother had anemia but is unsure of any specific diagnoses. Pt has not had any concerns for thyroid dysfunction or sudden weight changes. Pt has continued eating and drinking well.   Of note prior to the patient's visit today, pt has had Upper Endoscopy completed on 03/29/2019 with results revealing " - Z-line irregular, 37 cm from the incisors. Biopsied. - Normal upper third of esophagus, middle third of esophagus and lower third of esophagus. - Gastritis. Biopsied. - Normal duodenal bulb, first portion of the duodenum and second portion of the duodenum. Biopsied."  Of note since the patient's last visit, pt has had Colonoscopy completed on 03/29/2019 with results revealing "- One 8 mm polyp in the transverse colon, removed with a cold snare. Resected and retrieved. - Three 2 to 3 mm  polyps in the rectum, removed with a cold biopsy forceps. Resected and retrieved. - The distal rectum and anal verge are normal on retroflexion view."  Most recent lab results (06/17/2018) of CBC is as follows: all values are WNL except for WBC at 14.5K, RBC at 3.60, Hgb at 9.2, HCT at 30.6, MCH at 25.6, RDW at 17.6, Neutro Abs at 11.5K, Abs Immature Granulocytes at 0.12K, Glucose at 12, Creatinine at 1.04, Albumin at 3.2, AST at 6, Total Bilirubin at <0.2, GFR Est Non Afr Am at 57.  06/17/2018 Ferritin at 116  On review of systems, pt denies SOB, chest pains, unexpected weight changes, abdominal pain and any other symptoms.   On PMHx the pt reports Diabetes, CKD, HTN, Anemia. On Social Hx the pt reports little EtOH use, previous smoker (quit last year). On Family Hx the pt reports that her mother had anemia.  INTERVAL HISTORY:  [name] is a wonderful 65 y.o. female who is here today for f/u of anemia. The patient's last visit with [place] was on 04/05/2019. The pt reports that she is doing well overall.  The pt reports no new foot lightheadedness dizziness chest pain or acute shortness of breath.  Overall she is not very physically active and does not feel restricted in her baseline level of activity.  Lab results today 12/14/2020 of CBC w/diff shows chronic normocytic anemia with a hemoglobin of 9.8 which is improved from 8.5 2 years ago and 9.3 about 1-year ago She has mild neutrophilia of 8.6k Normal platelets of 281k  CMP is  as follows: Shows uncontrolled hyperglycemia with a blood sugar of 265 some element of chronic kidney disease with a creatinine of 1.3 Myeloma panel shows no monoclonal paraproteinemia Kappa lambda free light chain ratio within normal limits B12 levels within normal limits at 850 LDH within normal limits at 115 Ferritin of 133 with an iron saturation of 19%  On review of systems, pt reports no overt GI bleeding abdominal pain sudden weight loss or any other  acute new focal symptoms.    MEDICAL HISTORY:  Past Medical History:  Diagnosis Date   Atypical chest pain    a. non obstructive by cath in 2008 and 2010;  b. 02/2012 Myoview: non-ischemic, EF 57%   Cellulitis    a. left foot-> s/p L BKA   Chronic kidney disease    Constipation    CVA (cerebral infarction)    a.  Small right parietal noted incidentally 04/2007;  b. right sided embolic CVA 01/4708;  c. 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 near-syncopal episode in November 2008;  b. 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   Tobacco abuse    Vertigo     SURGICAL HISTORY: Past Surgical History:  Procedure Laterality Date   AMPUTATION  12/30/2011   Procedure: AMPUTATION RAY;  Surgeon: Wylene Simmer, MD;  Location: [place];  Service: Orthopedics;  Laterality: Left;  LEFT FIRST RAY AMPUTATION   AMPUTATION  01/13/2012   Procedure: AMPUTATION BELOW KNEE;  Surgeon: Wylene Simmer, MD;  Location: [place];  Service: Orthopedics;  Laterality: Left;   AMPUTATION  03/04/2012   Procedure: AMPUTATION BELOW KNEE;  Surgeon: Wylene Simmer, MD;  Location: [place];  Service: Orthopedics;  Laterality: Left;  Revision of Left Below Knee Amputation   AMPUTATION Right 11/02/2013   Procedure: AMPUTATION BELOW KNEE;  Surgeon: Newt Minion, MD;  Location: [place];  Service: Orthopedics;  Laterality: Right;  Right Below Knee Amputation   BIOPSY  03/29/2019    Procedure: BIOPSY;  Surgeon: Lavena Bullion, DO;  Location: [facility];  Service: Gastroenterology;;  EGD and Colon   CARDIAC CATHETERIZATION     LAD 30%, circumflex 50%, OM 75%, RI 60% with small branch 80%, dominant RCA 60%, EF 45-50%   CHOLECYSTECTOMY     COLONOSCOPY     COLONOSCOPY WITH PROPOFOL N/A 03/29/2019   Procedure: COLONOSCOPY WITH PROPOFOL;  Surgeon: Lavena Bullion, DO;  Location: [facility];  Service: Gastroenterology;  Laterality: N/A;   ESOPHAGOGASTRODUODENOSCOPY (EGD) WITH PROPOFOL N/A 03/29/2019   Procedure: ESOPHAGOGASTRODUODENOSCOPY (EGD) WITH PROPOFOL;  Surgeon: Lavena Bullion, DO;  Location: [facility];  Service: Gastroenterology;  Laterality: N/A;   EYE SURGERY Right    cataract   I & D EXTREMITY  12/23/2011   Procedure: IRRIGATION AND DEBRIDEMENT EXTREMITY;  Surgeon: Remo Lipps  Orlena Sheldon, MD;  Location: [place];  Service: Orthopedics;  Laterality: Left;  Left Foot   I & D EXTREMITY  12/26/2011   Procedure: IRRIGATION AND DEBRIDEMENT EXTREMITY;  Surgeon: Wylene Simmer, MD;  Location: [place];  Service: Orthopedics;  Laterality: Left;  LEFT FOOT I&D WITH POSSIBLE WOUND VAC APPLICATION, POSSIBLE LEFT FIRST RAY AMPUTATION   I & D EXTREMITY Right 10/29/2013   Procedure: IRRIGATION AND DEBRIDEMENT EXTREMITY;  Surgeon: Johnn Hai, MD;  Location: [place];  Service: Orthopedics;  Laterality: Right;   Invasive Electrophysiologic Study  5/09   followed by insertion of an implantable loop recorder. S/p removal    Multiple Toe Surgeries     POLYPECTOMY  03/29/2019   Procedure: POLYPECTOMY;  Surgeon: Lavena Bullion, DO;  Location: [facility];  Service: Gastroenterology;;   TEE WITHOUT CARDIOVERSION N/A 07/07/2012   Procedure: TRANSESOPHAGEAL ECHOCARDIOGRAM (TEE);  Surgeon: Lelon Perla, MD;  Location[facility] END[facility];  Service: Cardiovascular;  Laterality: N/A;    SOCIAL HISTORY: Social History   Socioeconomic History   Marital status: Single    Spouse name: Not on  file   Number of children: Not on file   Years of education: Not on file   Highest education level: Not on file  Occupational History   Occupation: Document processor    Comment: [employer]  Tobacco Use   Smoking status: Former    Packs/day: 0.25    Years: 40.00    Pack years: 10.00    Types: Cigarettes   Smokeless tobacco: Never   Tobacco comments:    has not smoked in about a year  Vaping Use   Vaping Use: Never used  Substance and Sexual Activity   Alcohol use: Yes    Alcohol/week: 4.0 standard drinks    Types: 4 Cans of beer per week    Comment: occ- foot ball season- 4 beers a week during football season.   Drug use: No   Sexual activity: Not Currently    Birth control/protection: Post-menopausal  Other Topics Concern   Not on file  Social History Narrative   Lives at [facility] since 12/19/14     Does not routinely exercise.     works as Barista.    >25 pack year history.    FULL CODE   Social Determinants of Health   Financial Resource Strain: Not on file  Food Insecurity: Not on file  Transportation Needs: Not on file  Physical Activity: Not on file  Stress: Not on file  Social Connections: Not on file  Intimate Partner Violence: Not on file    FAMILY HISTORY: Family History  Problem Relation Age of Onset   Pneumonia Mother    Gallbladder disease Mother        cancer   Heart failure Mother    Diabetes Father    Coronary artery disease Father    Stroke Neg Hx     ALLERGIES:  is allergic to banana, penicillins, and strawberry extract.  MEDICATIONS:  Current Outpatient Medications  Medication Sig Dispense Refill   albuterol (VENTOLIN HFA) 108 (90 Base) MCG/ACT inhaler Inhale 2 puffs into the lungs every 4 (four) hours as needed for wheezing or shortness of breath.     amLODipine (NORVASC) 2.5 MG tablet Take 2.5 mg by mouth daily.     aspirin EC 81 MG tablet Take 81 mg by mouth daily.     atorvastatin (LIPITOR) 20 MG tablet Take 20  mg by mouth daily.  carvedilol (COREG) 12.5 MG tablet Take 12.5 mg by mouth 2 (two) times daily.      cetirizine (ZYRTEC) 10 MG tablet Take 10 mg by mouth daily.     Cholecalciferol (VITAMIN D) 50 MCG (2000 UT) tablet Take 2,000 Units by mouth daily.     cyanocobalamin 1000 MCG tablet Take 1,000 mcg by mouth daily.     docusate sodium 100 MG CAPS Take 100 mg by mouth 2 (two) times daily. 10 capsule 0   ferrous sulfate 325 (65 FE) MG tablet Take 325 mg by mouth daily with breakfast.     gabapentin (NEURONTIN) 100 MG capsule Take 100 mg by mouth See admin instructions. Take with 400 mg morning dose to equal 500 mg in the morning, take with 600 mg night dose to equal 700 mg at night     gabapentin (NEURONTIN) 400 MG capsule Take 400 mg by mouth 2 (two) times daily.      gabapentin (NEURONTIN) 600 MG tablet Take 600 mg by mouth at bedtime.     hydrALAZINE (APRESOLINE) 25 MG tablet Take 75 mg by mouth 3 (three) times daily.     HYDROcodone-acetaminophen (NORCO/VICODIN) 5-325 MG tablet Take 1 tablet by mouth every 8 (eight) hours as needed for moderate pain.      insulin lispro (HUMALOG) 100 UNIT/ML injection Inject 2-14 Units into the skin 4 (four) times daily -  with meals and at bedtime. 201-250 = 2 units, 251-300 = 4 units, 301-350= 6 units, 351-400=8 units, 401-450=10 units, 451-500 =12 units >500 = 14 units     LEVEMIR 100 UNIT/ML injection Inject 53 Units into the skin at bedtime.      lisinopril-hydrochlorothiazide (ZESTORETIC) 20-12.5 MG tablet Take 2 tablets by mouth daily.     PAZEO 0.7 % SOLN Place 1 drop into both eyes daily.      polyethylene glycol (MIRALAX / GLYCOLAX) packet Take 17 g by mouth daily as needed for moderate constipation. (Patient taking differently: Take 17 g by mouth daily. ) 14 each 0   SitaGLIPtin-MetFORMIN HCl (JANUMET XR) 50-1000 MG TB24 Take 1 tablet by mouth daily.     Tetrahydrozoline HCl (VISINE OP) Place 1 drop into both eyes at bedtime.     vitamin C  (ASCORBIC ACID) 500 MG tablet Take 500 mg by mouth daily.     Current Facility-Administered Medications  Medication Dose Route Frequency Provider Last Rate Last Admin   aflibercept (EYLEA) SOLN 2 mg  2 mg Intravitreal  Bernarda Caffey, MD   2 mg at 03/19/18 1621   Bevacizumab (AVASTIN) SOLN 1.25 mg  1.25 mg Intravitreal  Bernarda Caffey, MD   1.25 mg at 11/01/17 2300   Bevacizumab (AVASTIN) SOLN 1.25 mg  1.25 mg Intravitreal  Bernarda Caffey, MD   1.25 mg at 12/01/17 1437   Bevacizumab (AVASTIN) SOLN 1.25 mg  1.25 mg Intravitreal  Bernarda Caffey, MD   1.25 mg at 12/31/17 2354   Bevacizumab (AVASTIN) SOLN 1.25 mg  1.25 mg Intravitreal  Bernarda Caffey, MD   1.25 mg at 01/27/18 1508    REVIEW OF SYSTEMS:    10 Point review of Systems was done is negative except as noted above.  PHYSICAL EXAMINATION: ECOG PERFORMANCE STATUS: 3 - Symptomatic, >50% confined to bed  . There were no vitals filed for this visit.  There were no vitals filed for this visit.  .There is no height or weight on file to calculate BMI.  NAD GENERAL:alert, in no acute distress and  comfortable SKIN: no acute rashes, no significant lesions EYES: conjunctiva are pink and non-injected, sclera anicteric OROPHARYNX: MMM, no exudates, no oropharyngeal erythema or ulceration NECK: supple, no JVD LYMPH:  no palpable lymphadenopathy in the cervical, axillary or inguinal regions LUNGS: clear to auscultation b/l with normal respiratory effort HEART: regular rate & rhythm ABDOMEN:  normoactive bowel sounds , non tender, not distended. PSYCH: alert & oriented x 3 with fluent speech NEURO: no focal motor/sensory deficits  LABORATORY DATA:  I have reviewed the data as listed  . CBC Latest Ref Rng & Units 12/14/2020 04/05/2019 06/17/2018  WBC 4.0 - 10.5 K/uL 11.7(H) 10.6(H) 14.5(H)  Hemoglobin 12.0 - 15.0 g/dL 9.8(L) 9.3(L) 9.2(L)  Hematocrit 36.0 - 46.0 % 32.6(L) 31.3(L) 30.6(L)  Platelets 150 - 400 K/uL 281 300 317   . CBC     Component Value Date/Time   WBC 11.7 (H) 12/14/2020 1021   WBC 10.6 (H) 04/05/2019 1057   RBC 3.73 (L) 12/14/2020 1021   HGB 9.8 (L) 12/14/2020 1021   HCT 32.6 (L) 12/14/2020 1021   PLT 281 12/14/2020 1021   MCV 87.4 12/14/2020 1021   MCH 26.3 12/14/2020 1021   MCHC 30.1 12/14/2020 1021   RDW 16.3 (H) 12/14/2020 1021   LYMPHSABS 2.3 12/14/2020 1021   MONOABS 0.4 12/14/2020 1021   EOSABS 0.3 12/14/2020 1021   BASOSABS 0.1 12/14/2020 1021     . CMP Latest Ref Rng & Units 12/14/2020 04/05/2019 06/17/2018  Glucose 70 - 99 mg/dL 265(H) 236(H) 122(H)  BUN 8 - 23 mg/dL 35(H) 22 21  Creatinine 0.44 - 1.00 mg/dL 1.31(H) 1.27(H) 1.04(H)  Sodium 135 - 145 mmol/L 144 139 143  Potassium 3.5 - 5.1 mmol/L 4.7 4.7 4.3  Chloride 98 - 111 mmol/L 112(H) 103 107  CO2 22 - 32 mmol/L $RemoveB'25 26 23  'NVJwHURl$ Calcium 8.9 - 10.3 mg/dL 9.3 9.4 9.2  Total Protein 6.5 - 8.1 g/dL 7.2 7.1 6.9  Total Bilirubin 0.3 - 1.2 mg/dL 0.2(L) 0.3 <0.2(L)  Alkaline Phos 38 - 126 U/L 151(H) 143(H) 125  AST 15 - 41 U/L 7(L) 10(L) 6(L)  ALT 0 - 44 U/L $Remo'12 12 7   'yRjjh$ Component     Latest Ref Rng & Units 12/14/2020  IgG (Immunoglobin G), Serum     586 - 1,602 mg/dL 1,182  IgA     87 - 352 mg/dL 120  IgM (Immunoglobulin M), Srm     26 - 217 mg/dL 114  Total Protein ELP     6.0 - 8.5 g/dL 6.5  Albumin SerPl Elph-Mcnc     2.9 - 4.4 g/dL 3.3  Alpha 1     0.0 - 0.4 g/dL 0.2  Alpha2 Glob SerPl Elph-Mcnc     0.4 - 1.0 g/dL 0.9  B-Globulin SerPl Elph-Mcnc     0.7 - 1.3 g/dL 1.0  Gamma Glob SerPl Elph-Mcnc     0.4 - 1.8 g/dL 1.1  M Protein SerPl Elph-Mcnc     Not Observed g/dL Not Observed  Globulin, Total     2.2 - 3.9 g/dL 3.2  Albumin/Glob SerPl     0.7 - 1.7 1.1  IFE 1      Comment  Please Note (HCV):      Comment  Iron     41 - 142 ug/dL 48  TIBC     236 - 444 ug/dL 248  Saturation Ratios     21 - 57 % 19 (L)  UIBC  120 - 384 ug/dL 200  Kappa free light chain     3.3 - 19.4 mg/L 62.1 (H)  Lambda free light  chains     5.7 - 26.3 mg/L 51.5 (H)  Kappa, lambda light chain ratio     0.26 - 1.65 1.21  Vitamin B12     180 - 914 pg/mL 850  LDH     98 - 192 U/L 115  Ferritin     11 - 307 ng/mL 133     RADIOGRAPHIC STUDIES: I have personally reviewed the radiological images as listed and agreed with the findings in the report. No results found.  ASSESSMENT & PLAN:   65 yo with   1) Normocytic Anemia-anemia of chronic disease due to poorly controlled diabetes and some element of chronic kidney disease.  PLAN: -Discussed pt labwork today, 12/14/2020;  Lab results today 12/14/2020 of CBC w/diff shows chronic normocytic anemia with a hemoglobin of 9.8 which is improved from 8.5 2 years ago and 9.3 about 1-year ago She has mild neutrophilia of 8.6k Normal platelets of 281k  CMP is as follows: Shows uncontrolled hyperglycemia with a blood sugar of 265 some element of chronic kidney disease with a creatinine of 1.3 Myeloma panel shows no monoclonal paraproteinemia Kappa lambda free light chain ratio within normal limits B12 levels within normal limits at 850 LDH within normal limits at 115 Ferritin of 133 with an iron saturation of 19%  -Would recommend she start iron polysaccharide 150 mg p.o. daily to try to target a ferritin level of close to 250 and iron saturation of 30%. -If despite optimizing iron levels she becomes anemic with a hemoglobin of less than 8.5 would need to consider bone marrow biopsy and possible use of erythropoietin. -Would recommend continued B complex 1 capsule p.o. daily.  FOLLOW UP: Return to clinic with Dr. Irene Limbo with labs in 6 months  . No orders of the defined types were placed in this encounter.   All of the patients questions were answered with apparent satisfaction. The patient knows to call the clinic with any problems, questions or concerns.   The total time spent in the appointment was 20 minutes and more than 50% was on counseling and direct patient  cares.    Sullivan Lone MD [place] AAHIVMS [facility] Hematology/Oncology Physician [facility]  (Office):       [phone] (Work cell):  [phone] (Fax):           [phone]  12/13/2020 10:23 PM   I, Reinaldo Raddle, am acting as scribe for Dr. Sullivan Lone, MD.    .I have reviewed the above documentation for accuracy and completeness, and I agree with the above. Brunetta Genera MD

## 2020-12-14 ENCOUNTER — Other Ambulatory Visit: Payer: Self-pay

## 2020-12-14 ENCOUNTER — Inpatient Hospital Stay (HOSPITAL_BASED_OUTPATIENT_CLINIC_OR_DEPARTMENT_OTHER): Payer: Medicare Other | Admitting: Hematology

## 2020-12-14 ENCOUNTER — Inpatient Hospital Stay: Payer: Medicare Other | Attending: Hematology

## 2020-12-14 VITALS — BP 124/41 | HR 74 | Temp 98.6°F | Resp 18 | Ht 66.0 in

## 2020-12-14 DIAGNOSIS — Z809 Family history of malignant neoplasm, unspecified: Secondary | ICD-10-CM | POA: Diagnosis not present

## 2020-12-14 DIAGNOSIS — Z89511 Acquired absence of right leg below knee: Secondary | ICD-10-CM | POA: Insufficient documentation

## 2020-12-14 DIAGNOSIS — Z836 Family history of other diseases of the respiratory system: Secondary | ICD-10-CM | POA: Insufficient documentation

## 2020-12-14 DIAGNOSIS — Z8719 Personal history of other diseases of the digestive system: Secondary | ICD-10-CM | POA: Diagnosis not present

## 2020-12-14 DIAGNOSIS — Z79899 Other long term (current) drug therapy: Secondary | ICD-10-CM | POA: Diagnosis not present

## 2020-12-14 DIAGNOSIS — Z8379 Family history of other diseases of the digestive system: Secondary | ICD-10-CM | POA: Diagnosis not present

## 2020-12-14 DIAGNOSIS — D649 Anemia, unspecified: Secondary | ICD-10-CM

## 2020-12-14 DIAGNOSIS — Z880 Allergy status to penicillin: Secondary | ICD-10-CM | POA: Diagnosis not present

## 2020-12-14 DIAGNOSIS — Z8249 Family history of ischemic heart disease and other diseases of the circulatory system: Secondary | ICD-10-CM | POA: Diagnosis not present

## 2020-12-14 DIAGNOSIS — Z89512 Acquired absence of left leg below knee: Secondary | ICD-10-CM | POA: Diagnosis not present

## 2020-12-14 DIAGNOSIS — D508 Other iron deficiency anemias: Secondary | ICD-10-CM | POA: Diagnosis not present

## 2020-12-14 DIAGNOSIS — Z87891 Personal history of nicotine dependence: Secondary | ICD-10-CM | POA: Diagnosis not present

## 2020-12-14 DIAGNOSIS — N189 Chronic kidney disease, unspecified: Secondary | ICD-10-CM | POA: Insufficient documentation

## 2020-12-14 DIAGNOSIS — E1122 Type 2 diabetes mellitus with diabetic chronic kidney disease: Secondary | ICD-10-CM | POA: Diagnosis not present

## 2020-12-14 DIAGNOSIS — E785 Hyperlipidemia, unspecified: Secondary | ICD-10-CM | POA: Insufficient documentation

## 2020-12-14 DIAGNOSIS — Z8673 Personal history of transient ischemic attack (TIA), and cerebral infarction without residual deficits: Secondary | ICD-10-CM | POA: Diagnosis not present

## 2020-12-14 DIAGNOSIS — Z9049 Acquired absence of other specified parts of digestive tract: Secondary | ICD-10-CM | POA: Diagnosis not present

## 2020-12-14 DIAGNOSIS — D638 Anemia in other chronic diseases classified elsewhere: Secondary | ICD-10-CM | POA: Insufficient documentation

## 2020-12-14 DIAGNOSIS — I129 Hypertensive chronic kidney disease with stage 1 through stage 4 chronic kidney disease, or unspecified chronic kidney disease: Secondary | ICD-10-CM | POA: Diagnosis not present

## 2020-12-14 DIAGNOSIS — Z833 Family history of diabetes mellitus: Secondary | ICD-10-CM | POA: Diagnosis not present

## 2020-12-14 LAB — IRON AND TIBC
Iron: 48 ug/dL (ref 41–142)
Saturation Ratios: 19 % — ABNORMAL LOW (ref 21–57)
TIBC: 248 ug/dL (ref 236–444)
UIBC: 200 ug/dL (ref 120–384)

## 2020-12-14 LAB — CBC WITH DIFFERENTIAL (CANCER CENTER ONLY)
Abs Immature Granulocytes: 0.09 10*3/uL — ABNORMAL HIGH (ref 0.00–0.07)
Basophils Absolute: 0.1 10*3/uL (ref 0.0–0.1)
Basophils Relative: 1 %
Eosinophils Absolute: 0.3 10*3/uL (ref 0.0–0.5)
Eosinophils Relative: 2 %
HCT: 32.6 % — ABNORMAL LOW (ref 36.0–46.0)
Hemoglobin: 9.8 g/dL — ABNORMAL LOW (ref 12.0–15.0)
Immature Granulocytes: 1 %
Lymphocytes Relative: 19 %
Lymphs Abs: 2.3 10*3/uL (ref 0.7–4.0)
MCH: 26.3 pg (ref 26.0–34.0)
MCHC: 30.1 g/dL (ref 30.0–36.0)
MCV: 87.4 fL (ref 80.0–100.0)
Monocytes Absolute: 0.4 10*3/uL (ref 0.1–1.0)
Monocytes Relative: 3 %
Neutro Abs: 8.6 10*3/uL — ABNORMAL HIGH (ref 1.7–7.7)
Neutrophils Relative %: 74 %
Platelet Count: 281 10*3/uL (ref 150–400)
RBC: 3.73 MIL/uL — ABNORMAL LOW (ref 3.87–5.11)
RDW: 16.3 % — ABNORMAL HIGH (ref 11.5–15.5)
WBC Count: 11.7 10*3/uL — ABNORMAL HIGH (ref 4.0–10.5)
nRBC: 0 % (ref 0.0–0.2)

## 2020-12-14 LAB — CMP (CANCER CENTER ONLY)
ALT: 12 U/L (ref 0–44)
AST: 7 U/L — ABNORMAL LOW (ref 15–41)
Albumin: 3.1 g/dL — ABNORMAL LOW (ref 3.5–5.0)
Alkaline Phosphatase: 151 U/L — ABNORMAL HIGH (ref 38–126)
Anion gap: 7 (ref 5–15)
BUN: 35 mg/dL — ABNORMAL HIGH (ref 8–23)
CO2: 25 mmol/L (ref 22–32)
Calcium: 9.3 mg/dL (ref 8.9–10.3)
Chloride: 112 mmol/L — ABNORMAL HIGH (ref 98–111)
Creatinine: 1.31 mg/dL — ABNORMAL HIGH (ref 0.44–1.00)
GFR, Estimated: 45 mL/min — ABNORMAL LOW (ref >60)
Glucose, Bld: 265 mg/dL — ABNORMAL HIGH (ref 70–99)
Potassium: 4.7 mmol/L (ref 3.5–5.1)
Sodium: 144 mmol/L (ref 135–145)
Total Bilirubin: 0.2 mg/dL — ABNORMAL LOW (ref 0.3–1.2)
Total Protein: 7.2 g/dL (ref 6.5–8.1)

## 2020-12-14 LAB — LACTATE DEHYDROGENASE: LDH: 115 U/L (ref 98–192)

## 2020-12-14 LAB — FERRITIN: Ferritin: 133 ng/mL (ref 11–307)

## 2020-12-14 LAB — VITAMIN B12: Vitamin B-12: 850 pg/mL (ref 180–914)

## 2020-12-17 LAB — MULTIPLE MYELOMA PANEL, SERUM
Albumin SerPl Elph-Mcnc: 3.3 g/dL (ref 2.9–4.4)
Albumin/Glob SerPl: 1.1 (ref 0.7–1.7)
Alpha 1: 0.2 g/dL (ref 0.0–0.4)
Alpha2 Glob SerPl Elph-Mcnc: 0.9 g/dL (ref 0.4–1.0)
B-Globulin SerPl Elph-Mcnc: 1 g/dL (ref 0.7–1.3)
Gamma Glob SerPl Elph-Mcnc: 1.1 g/dL (ref 0.4–1.8)
Globulin, Total: 3.2 g/dL (ref 2.2–3.9)
IgA: 120 mg/dL (ref 87–352)
IgG (Immunoglobin G), Serum: 1182 mg/dL (ref 586–1602)
IgM (Immunoglobulin M), Srm: 114 mg/dL (ref 26–217)
Total Protein ELP: 6.5 g/dL (ref 6.0–8.5)

## 2020-12-17 LAB — KAPPA/LAMBDA LIGHT CHAINS
Kappa free light chain: 62.1 mg/L — ABNORMAL HIGH (ref 3.3–19.4)
Kappa, lambda light chain ratio: 1.21 (ref 0.26–1.65)
Lambda free light chains: 51.5 mg/L — ABNORMAL HIGH (ref 5.7–26.3)

## 2020-12-18 ENCOUNTER — Telehealth: Payer: Self-pay | Admitting: Hematology

## 2020-12-18 NOTE — Telephone Encounter (Signed)
Scheduled follow-up appointment per 7/29 los. Patient is aware. Mailed calendar.

## 2021-05-14 ENCOUNTER — Other Ambulatory Visit: Payer: Self-pay

## 2021-05-14 ENCOUNTER — Non-Acute Institutional Stay: Payer: Medicare Other | Admitting: Hospice

## 2021-05-14 DIAGNOSIS — Z515 Encounter for palliative care: Secondary | ICD-10-CM

## 2021-05-14 DIAGNOSIS — G546 Phantom limb syndrome with pain: Secondary | ICD-10-CM

## 2021-05-14 DIAGNOSIS — E11319 Type 2 diabetes mellitus with unspecified diabetic retinopathy without macular edema: Secondary | ICD-10-CM

## 2021-05-14 DIAGNOSIS — D649 Anemia, unspecified: Secondary | ICD-10-CM

## 2021-05-14 NOTE — Progress Notes (Signed)
[facility] Consult Note Telephone: [phone]  Fax: [phone]  PATIENT NAME: [name] DOB: 11-10-1955 MRN: [id number]  PRIMARY CARE PROVIDER:  Dr. Jules Husbands REFERRING PROVIDER: Dr. Jules Husbands RESPONSIBLE PARTY:   Self Contact_ Niece [name] [address]     Name Relation Home Work Mobile   [place] Niece [phone]    [name] [phone]     [name], [name] [phone]         Visit is to build trust and highlight Palliative Medicine as specialized medical care for people living with serious illness, aimed at facilitating better quality of life through symptoms relief, assisting with advance care planning and complex medical decision making. This is a follow up visit.  RECOMMENDATIONS/PLAN:   Advance Care Planning/Code Status: Patient is an Full code  Goals of Care: Goals of care include to maximize quality of life and symptom management.  Visit consisted of counseling and education dealing with the complex and emotionally intense issues of symptom management and palliative care in the setting of serious and potentially life-threatening illness. Palliative care team will continue to support patient, patient's family, and medical team.  Symptom management/Plan:  Normocytic anemia: chronic current hgb 9.8; no bleeding, no dark tarry stool, no dizziness. Patient is currently on Ferrous Sulphate 325 mg daily. Recommendation: Take Ferrous sulphate with Vit C supplemen 500 mg to promote absorption. Manage constipation with Miralax and Senna S. Repeat CBC, BMP. Increase iron to BID if no improvement. Patient recently seen by Oncology. Continue follow up appointments as planned.  Type 2 diabetes mellitus well managed.  Last A1c Nov 2022 is 7.4.  Continue insulin as currently ordered.  Continue current diet, no concentrated sweets.  Monitor for  hypoglycemic/hyperglycemic events and notify provider accordingly. Repeat A1c in three months.  Phantom limb pain: Managed with Gabapentin Follow up: Palliative care will continue to follow for complex medical decision making, advance care planning, and clarification of goals. Return 6 weeks or prn. Encouraged to call provider sooner with any concerns.  CHIEF COMPLAINT: Palliative follow up  HISTORY OF PRESENT ILLNESS: [name] is a 65 y.o. female with multiple medical problems including  Normocytic anemia, HTN, type 2 diabetes mellitus, chronic, insulin-dependent with renal complication and retinopathy.  History of BKA, CKD 3, HLD,  CVA. No fall or hospitalization since last visit. Patient denies pain/discomfort, no hematochezia/melena, no dizziness. History obtained from review of EMR, discussion with primary team, family and/or patient. Records reviewed and summarized above. All 10 point systems reviewed and are negative.   Review and summarization of Epic records shows history from other than patient.   Palliative Care was asked to follow this patient of help address complex decision making in the context of advance care planning and goals of care clarification.   Latest Reference Range & Units Most Recent 04/05/19 10:57 12/14/20 10:21  RBC 3.87 - 5.11 MIL/uL 3.73 (L) 12/14/20 10:21 3.82 (L) 3.73 (L)  Hemoglobin 12.0 - 15.0 g/dL 9.8 (L) 12/14/20 10:21 9.3 (L) 9.8 (L)  (L): Data is abnormally low  PHYSICAL EXAM  General: In no acute distress,cooperative Cardiovascular: regular rate and rhythm Pulmonary: no cough, no increased work of breathing, normal respiratory effort Abdomen: soft, non tender, positive bowel sounds in all quadrants GU:  no suprapubic tenderness Eyes: Normal lids, no discharge, sclera anicteric ENMT: Moist mucous membranes Musculoskeletal: Bilateral AKA Skin: no rash to visible skin, warm without cyanosis Psych: non-anxious  affect Neurological: Weakness but  otherwise non focal, alert and oriented x4 Heme/lymph/immuno: no bruises, no bleeding  PERTINENT MEDICATIONS:  Outpatient Encounter Medications as of 05/14/2021  Medication Sig   albuterol (VENTOLIN HFA) 108 (90 Base) MCG/ACT inhaler Inhale 2 puffs into the lungs every 4 (four) hours as needed for wheezing or shortness of breath.   amLODipine (NORVASC) 2.5 MG tablet Take 2.5 mg by mouth daily.   aspirin EC 81 MG tablet Take 81 mg by mouth daily.   atorvastatin (LIPITOR) 20 MG tablet Take 20 mg by mouth daily.   carvedilol (COREG) 12.5 MG tablet Take 12.5 mg by mouth 2 (two) times daily.    cetirizine (ZYRTEC) 10 MG tablet Take 10 mg by mouth daily.   Cholecalciferol (VITAMIN D) 50 MCG (2000 UT) tablet Take 2,000 Units by mouth daily.   cyanocobalamin 1000 MCG tablet Take 1,000 mcg by mouth daily.   docusate sodium 100 MG CAPS Take 100 mg by mouth 2 (two) times daily.   ferrous sulfate 325 (65 FE) MG tablet Take 325 mg by mouth daily with breakfast.   gabapentin (NEURONTIN) 100 MG capsule Take 100 mg by mouth See admin instructions. Take with 400 mg morning dose to equal 500 mg in the morning, take with 600 mg night dose to equal 700 mg at night   gabapentin (NEURONTIN) 400 MG capsule Take 400 mg by mouth 2 (two) times daily.    gabapentin (NEURONTIN) 600 MG tablet Take 600 mg by mouth at bedtime.   hydrALAZINE (APRESOLINE) 25 MG tablet Take 75 mg by mouth 3 (three) times daily.   HYDROcodone-acetaminophen (NORCO/VICODIN) 5-325 MG tablet Take 1 tablet by mouth every 8 (eight) hours as needed for moderate pain.    insulin lispro (HUMALOG) 100 UNIT/ML injection Inject 2-14 Units into the skin 4 (four) times daily -  with meals and at bedtime. 201-250 = 2 units, 251-300 = 4 units, 301-350= 6 units, 351-400=8 units, 401-450=10 units, 451-500 =12 units >500 = 14 units   LEVEMIR 100 UNIT/ML injection Inject 53 Units into the skin at bedtime.    lisinopril-hydrochlorothiazide (ZESTORETIC) 20-12.5 MG  tablet Take 2 tablets by mouth daily.   PAZEO 0.7 % SOLN Place 1 drop into both eyes daily.    polyethylene glycol (MIRALAX / GLYCOLAX) packet Take 17 g by mouth daily as needed for moderate constipation. (Patient taking differently: Take 17 g by mouth daily. )   SitaGLIPtin-MetFORMIN HCl (JANUMET XR) 50-1000 MG TB24 Take 1 tablet by mouth daily.   Tetrahydrozoline HCl (VISINE OP) Place 1 drop into both eyes at bedtime.   vitamin C (ASCORBIC ACID) 500 MG tablet Take 500 mg by mouth daily.   Facility-Administered Encounter Medications as of 05/14/2021  Medication   aflibercept (EYLEA) SOLN 2 mg   Bevacizumab (AVASTIN) SOLN 1.25 mg   Bevacizumab (AVASTIN) SOLN 1.25 mg   Bevacizumab (AVASTIN) SOLN 1.25 mg   Bevacizumab (AVASTIN) SOLN 1.25 mg    HOSPICE ELIGIBILITY/DIAGNOSIS: TBD  PAST MEDICAL HISTORY:  Past Medical History:  Diagnosis Date   Atypical chest pain    a. non obstructive by cath in 2008 and 2010;  b. 02/2012 Myoview: non-ischemic, EF 57%   Cellulitis    a. left foot-> s/p L BKA   Chronic kidney disease    Constipation    CVA (cerebral infarction)    a.  Small right parietal noted incidentally 04/2007;  b. right sided embolic CVA 08/979;  c. 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 near-syncopal episode in November 2008;  b. 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   Tobacco abuse    Vertigo      ALLERGIES:   Allergies  Allergen Reactions   Banana Swelling and Other (See Comments)    Facial swelling   Penicillins Itching, Swelling and Rash    Face swells and break out   Strawberry Extract Swelling and Other (See Comments)    Facial swelling      I spent  50 minutes providing this consultation; this includes time spent with patient/family, chart review and documentation. More than 50% of the time in this consultation was spent on counseling and coordinating communication   Thank you for the opportunity to participate in the care of SHOSHANNA MCQUITTY Please call our office at [phone] if we can be of additional assistance.  Note: Portions of this note were generated with [employer]. Dictation errors may occur despite best attempts at proofreading.  Teodoro Spray, NP

## 2021-06-14 ENCOUNTER — Other Ambulatory Visit: Payer: Self-pay | Admitting: *Deleted

## 2021-06-14 DIAGNOSIS — D649 Anemia, unspecified: Secondary | ICD-10-CM

## 2021-06-17 ENCOUNTER — Other Ambulatory Visit: Payer: Self-pay

## 2021-06-17 ENCOUNTER — Inpatient Hospital Stay: Payer: Commercial Managed Care - HMO | Attending: Hematology | Admitting: Hematology

## 2021-06-17 ENCOUNTER — Inpatient Hospital Stay: Payer: Commercial Managed Care - HMO

## 2021-06-17 VITALS — BP 128/49 | HR 72 | Temp 98.2°F | Resp 14

## 2021-06-17 DIAGNOSIS — D649 Anemia, unspecified: Secondary | ICD-10-CM

## 2021-06-17 DIAGNOSIS — D508 Other iron deficiency anemias: Secondary | ICD-10-CM | POA: Diagnosis not present

## 2021-06-17 LAB — CBC WITH DIFFERENTIAL (CANCER CENTER ONLY)
Abs Immature Granulocytes: 0.07 10*3/uL (ref 0.00–0.07)
Basophils Absolute: 0.1 10*3/uL (ref 0.0–0.1)
Basophils Relative: 0 %
Eosinophils Absolute: 0.3 10*3/uL (ref 0.0–0.5)
Eosinophils Relative: 2 %
HCT: 29.6 % — ABNORMAL LOW (ref 36.0–46.0)
Hemoglobin: 8.9 g/dL — ABNORMAL LOW (ref 12.0–15.0)
Immature Granulocytes: 1 %
Lymphocytes Relative: 22 %
Lymphs Abs: 2.5 10*3/uL (ref 0.7–4.0)
MCH: 25.7 pg — ABNORMAL LOW (ref 26.0–34.0)
MCHC: 30.1 g/dL (ref 30.0–36.0)
MCV: 85.5 fL (ref 80.0–100.0)
Monocytes Absolute: 0.4 10*3/uL (ref 0.1–1.0)
Monocytes Relative: 3 %
Neutro Abs: 8.3 10*3/uL — ABNORMAL HIGH (ref 1.7–7.7)
Neutrophils Relative %: 72 %
Platelet Count: 260 10*3/uL (ref 150–400)
RBC: 3.46 MIL/uL — ABNORMAL LOW (ref 3.87–5.11)
RDW: 16.1 % — ABNORMAL HIGH (ref 11.5–15.5)
WBC Count: 11.5 10*3/uL — ABNORMAL HIGH (ref 4.0–10.5)
nRBC: 0 % (ref 0.0–0.2)

## 2021-06-17 LAB — IRON AND IRON BINDING CAPACITY (CC-WL,HP ONLY)
Iron: 30 ug/dL — ABNORMAL LOW (ref 41–142)
Saturation Ratios: 12 % — ABNORMAL LOW (ref 21–57)
TIBC: 258 ug/dL (ref 236–444)
UIBC: 228 ug/dL (ref 120–384)

## 2021-06-17 LAB — CMP (CANCER CENTER ONLY)
ALT: 10 U/L (ref 10–47)
AST: 8 U/L — ABNORMAL LOW (ref 11–38)
Albumin: 3.5 g/dL (ref 3.5–5.0)
Alkaline Phosphatase: 112 U/L (ref 38–126)
Anion gap: 8 (ref 5–15)
BUN: 30 mg/dL — ABNORMAL HIGH (ref 8–23)
CO2: 25 mmol/L (ref 22–32)
Calcium: 8.9 mg/dL (ref 8.9–10.3)
Chloride: 107 mmol/L (ref 98–111)
Creatinine: 1.16 mg/dL (ref 0.60–1.20)
GFR, Estimated: 52 mL/min — ABNORMAL LOW (ref >60)
Glucose, Bld: 208 mg/dL — ABNORMAL HIGH (ref 70–99)
Potassium: 4.6 mmol/L (ref 3.5–5.1)
Sodium: 140 mmol/L (ref 135–145)
Total Bilirubin: 0.3 mg/dL (ref 0.2–1.6)
Total Protein: 6.7 g/dL (ref 6.5–8.1)

## 2021-06-17 LAB — LACTATE DEHYDROGENASE: LDH: 89 U/L — ABNORMAL LOW (ref 98–192)

## 2021-06-17 LAB — FERRITIN: Ferritin: 126 ng/mL (ref 11–307)

## 2021-06-17 LAB — VITAMIN B12: Vitamin B-12: 1264 pg/mL — ABNORMAL HIGH (ref 180–914)

## 2021-06-18 LAB — KAPPA/LAMBDA LIGHT CHAINS
Kappa free light chain: 68.4 mg/L — ABNORMAL HIGH (ref 3.3–19.4)
Kappa, lambda light chain ratio: 1.21 (ref 0.26–1.65)
Lambda free light chains: 56.3 mg/L — ABNORMAL HIGH (ref 5.7–26.3)

## 2021-06-20 LAB — MULTIPLE MYELOMA PANEL, SERUM
Albumin SerPl Elph-Mcnc: 3.1 g/dL (ref 2.9–4.4)
Albumin/Glob SerPl: 1.1 (ref 0.7–1.7)
Alpha 1: 0.2 g/dL (ref 0.0–0.4)
Alpha2 Glob SerPl Elph-Mcnc: 0.9 g/dL (ref 0.4–1.0)
B-Globulin SerPl Elph-Mcnc: 1 g/dL (ref 0.7–1.3)
Gamma Glob SerPl Elph-Mcnc: 1.1 g/dL (ref 0.4–1.8)
Globulin, Total: 3.1 g/dL (ref 2.2–3.9)
IgA: 106 mg/dL (ref 87–352)
IgG (Immunoglobin G), Serum: 1033 mg/dL (ref 586–1602)
IgM (Immunoglobulin M), Srm: 87 mg/dL (ref 26–217)
Total Protein ELP: 6.2 g/dL (ref 6.0–8.5)

## 2021-06-23 NOTE — Progress Notes (Signed)
HEMATOLOGY/ONCOLOGY CLINIC NOTE  Date of Service: .06/17/2021   Patient Care Team: Duffy Bruce, Manya Silvas, MD as PCP - General (Internal Medicine) Verl Blalock, Marijo Conception, MD (Inactive) (Cardiology)  CHIEF COMPLAINTS/PURPOSE OF CONSULTATION:  Follow-up for continued evaluation and management of normocytic anemia  HISTORY OF PRESENTING ILLNESS:   [name] is a wonderful 66 y.o. female who has previously been seen by Dr. Walden Field for evaluation and management of normocytic anemia. The pt reports that she is doing well overall.  The pt reports that she had a Colonoscopy and an Upper Endoscopy last week. She denies any major medication changes since her last visit. She has felt the same in the last year and has no new concerns. Pt quit smoking over a year ago and does not drink much alcohol. Pt is currently at [facility], a skilled nursing facility. She has not been vitamin deficient to her knowledge. Pt reports that her diabetes has been well-controlled and denies any kidney dysfunction due to diabetes. She has been anemic for years and does now know the cause. She was not having frequent infections prior to her initial diagnosis of anemia. She believes that her mother had anemia but is unsure of any specific diagnoses. Pt has not had any concerns for thyroid dysfunction or sudden weight changes. Pt has continued eating and drinking well.   Of note prior to the patient's visit today, pt has had Upper Endoscopy completed on 03/29/2019 with results revealing " - Z-line irregular, 37 cm from the incisors. Biopsied. - Normal upper third of esophagus, middle third of esophagus and lower third of esophagus. - Gastritis. Biopsied. - Normal duodenal bulb, first portion of the duodenum and second portion of the duodenum. Biopsied."  Of note since the patient's last visit, pt has had Colonoscopy completed on 03/29/2019 with results revealing "- One 8 mm polyp in the transverse colon, removed with a cold  snare. Resected and retrieved. - Three 2 to 3 mm polyps in the rectum, removed with a cold biopsy forceps. Resected and retrieved. - The distal rectum and anal verge are normal on retroflexion view."  Most recent lab results (06/17/2018) of CBC is as follows: all values are WNL except for WBC at 14.5K, RBC at 3.60, Hgb at 9.2, HCT at 30.6, MCH at 25.6, RDW at 17.6, Neutro Abs at 11.5K, Abs Immature Granulocytes at 0.12K, Glucose at 12, Creatinine at 1.04, Albumin at 3.2, AST at 6, Total Bilirubin at <0.2, GFR Est Non Afr Am at 57.  06/17/2018 Ferritin at 116  On review of systems, pt denies SOB, chest pains, unexpected weight changes, abdominal pain and any other symptoms.   On PMHx the pt reports Diabetes, CKD, HTN, Anemia. On Social Hx the pt reports little EtOH use, previous smoker (quit last year). On Family Hx the pt reports that her mother had anemia.  INTERVAL HISTORY:  [name] is here for continued evaluation and management of her multifactorial anemia. She is here with her healthcare aide from her nursing facility. Patient notes no acute new symptoms.  No lightheadedness or dizziness.  Continues to be wheelchair and bedbound and has no symptoms of chest pain or shortness of breath with her limited mobility. Questionable control of her diabetes. Patient notes no overt evidence of GI bleeding or other blood loss.  Labs today show stable chronic anemia with a hemoglobin of 8.9 which is close to her baseline of 8.5-9.5.  MCV of 85.5, WBC count of 11.5k and platelets  of 260k CMP stable creatinine 1.16 blood sugar of 208, unremarkable LFTs Ferritin 126 with an iron saturation of 12%. B12 within normal limits 1264 LDH not elevated  No other acute new symptoms.  MEDICAL HISTORY:  Past Medical History:  Diagnosis Date   Atypical chest pain    a. non obstructive by cath in 2008 and 2010;  b. 02/2012 Myoview: non-ischemic, EF 57%   Cellulitis    a. left foot-> s/p L BKA    Chronic kidney disease    Constipation    CVA (cerebral infarction)    a.  Small right parietal noted incidentally 04/2007;  b. right sided embolic CVA 10/2701;  c. 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 near-syncopal episode in November 2008;  b. 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   Tobacco abuse    Vertigo     SURGICAL HISTORY: Past Surgical History:  Procedure Laterality Date   AMPUTATION  12/30/2011   Procedure: AMPUTATION RAY;  Surgeon: Wylene Simmer, MD;  Location: [place];  Service: Orthopedics;  Laterality: Left;  LEFT FIRST RAY AMPUTATION   AMPUTATION  01/13/2012   Procedure: AMPUTATION BELOW KNEE;  Surgeon: Wylene Simmer, MD;  Location: [place];  Service: Orthopedics;  Laterality: Left;   AMPUTATION  03/04/2012   Procedure: AMPUTATION BELOW KNEE;  Surgeon: Wylene Simmer, MD;  Location: [place];  Service: Orthopedics;  Laterality: Left;  Revision of Left Below Knee Amputation   AMPUTATION Right 11/02/2013   Procedure: AMPUTATION BELOW KNEE;  Surgeon: Newt Minion, MD;  Location: [place];  Service: Orthopedics;  Laterality: Right;  Right Below Knee Amputation   BIOPSY  03/29/2019   Procedure: BIOPSY;  Surgeon: Lavena Bullion, DO;  Location: [facility];  Service: Gastroenterology;;  EGD and Colon   CARDIAC CATHETERIZATION     LAD 30%, circumflex 50%, OM 75%, RI 60% with small branch 80%, dominant RCA 60%, EF 45-50%    CHOLECYSTECTOMY     COLONOSCOPY     COLONOSCOPY WITH PROPOFOL N/A 03/29/2019   Procedure: COLONOSCOPY WITH PROPOFOL;  Surgeon: Lavena Bullion, DO;  Location: [facility];  Service: Gastroenterology;  Laterality: N/A;   ESOPHAGOGASTRODUODENOSCOPY (EGD) WITH PROPOFOL N/A 03/29/2019   Procedure: ESOPHAGOGASTRODUODENOSCOPY (EGD) WITH PROPOFOL;  Surgeon: Lavena Bullion, DO;  Location: [facility];  Service: Gastroenterology;  Laterality: N/A;   EYE SURGERY Right    cataract   I & D EXTREMITY  12/23/2011   Procedure: IRRIGATION AND DEBRIDEMENT EXTREMITY;  Surgeon: Augustin Schooling, MD;  Location: [place];  Service: Orthopedics;  Laterality: Left;  Left Foot   I & D EXTREMITY  12/26/2011   Procedure: IRRIGATION AND DEBRIDEMENT EXTREMITY;  Surgeon: Wylene Simmer, MD;  Location: [place];  Service: Orthopedics;  Laterality: Left;  LEFT FOOT I&D  WITH POSSIBLE WOUND VAC APPLICATION, POSSIBLE LEFT FIRST RAY AMPUTATION   I & D EXTREMITY Right 10/29/2013   Procedure: IRRIGATION AND DEBRIDEMENT EXTREMITY;  Surgeon: Johnn Hai, MD;  Location: [place];  Service: Orthopedics;  Laterality: Right;   Invasive Electrophysiologic Study  5/09   followed by insertion of an implantable loop recorder. S/p removal    Multiple Toe Surgeries     POLYPECTOMY  03/29/2019   Procedure: POLYPECTOMY;  Surgeon: Lavena Bullion, DO;  Location: [facility];  Service: Gastroenterology;;   TEE WITHOUT CARDIOVERSION N/A 07/07/2012   Procedure: TRANSESOPHAGEAL ECHOCARDIOGRAM (TEE);  Surgeon: Lelon Perla, MD;  Location: [facility];  Service: Cardiovascular;  Laterality: N/A;    SOCIAL HISTORY: Social History   Socioeconomic History   Marital status: Single    Spouse name: Not on file   Number of children: Not on file   Years of education: Not on file   Highest education level: Not on file  Occupational History   Occupation: Document processor    Comment: [employer]  Tobacco Use   Smoking status: Former     Packs/day: 0.25    Years: 40.00    Pack years: 10.00    Types: Cigarettes   Smokeless tobacco: Never   Tobacco comments:    has not smoked in about a year  Vaping Use   Vaping Use: Never used  Substance and Sexual Activity   Alcohol use: Yes    Alcohol/week: 4.0 standard drinks    Types: 4 Cans of beer per week    Comment: occ- foot ball season- 4 beers a week during football season.   Drug use: No   Sexual activity: Not Currently    Birth control/protection: Post-menopausal  Other Topics Concern   Not on file  Social History Narrative   Lives at [facility] since 12/19/14     Does not routinely exercise.     works as Barista.    >25 pack year history.    FULL CODE   Social Determinants of Health   Financial Resource Strain: Not on file  Food Insecurity: Not on file  Transportation Needs: Not on file  Physical Activity: Not on file  Stress: Not on file  Social Connections: Not on file  Intimate Partner Violence: Not on file    FAMILY HISTORY: Family History  Problem Relation Age of Onset   Pneumonia Mother    Gallbladder disease Mother        cancer   Heart failure Mother    Diabetes Father    Coronary artery disease Father    Stroke Neg Hx     ALLERGIES:  is allergic to banana, penicillins, and strawberry extract.  MEDICATIONS:  Current Outpatient Medications  Medication Sig Dispense Refill   albuterol (VENTOLIN HFA) 108 (90 Base) MCG/ACT inhaler Inhale 2 puffs into the lungs every 4 (four) hours as needed for wheezing or shortness of breath.     amLODipine (NORVASC) 2.5 MG tablet Take 2.5 mg by mouth daily.     aspirin EC 81 MG tablet Take 81 mg by mouth daily.     atorvastatin (LIPITOR) 20 MG tablet Take 20 mg by mouth daily.     carvedilol (COREG) 12.5 MG tablet Take 12.5 mg by mouth 2 (two) times daily.      cetirizine (ZYRTEC) 10 MG tablet Take 10 mg by mouth daily.     Cholecalciferol (VITAMIN D) 50 MCG (2000 UT) tablet Take 2,000 Units by  mouth  daily.     cyanocobalamin 1000 MCG tablet Take 1,000 mcg by mouth daily.     docusate sodium 100 MG CAPS Take 100 mg by mouth 2 (two) times daily. 10 capsule 0   ferrous sulfate 325 (65 FE) MG tablet Take 325 mg by mouth daily with breakfast.     gabapentin (NEURONTIN) 100 MG capsule Take 100 mg by mouth See admin instructions. Take with 400 mg morning dose to equal 500 mg in the morning, take with 600 mg night dose to equal 700 mg at night     gabapentin (NEURONTIN) 400 MG capsule Take 400 mg by mouth 2 (two) times daily.      gabapentin (NEURONTIN) 600 MG tablet Take 600 mg by mouth at bedtime.     hydrALAZINE (APRESOLINE) 25 MG tablet Take 75 mg by mouth 3 (three) times daily.     HYDROcodone-acetaminophen (NORCO/VICODIN) 5-325 MG tablet Take 1 tablet by mouth every 8 (eight) hours as needed for moderate pain.      insulin lispro (HUMALOG) 100 UNIT/ML injection Inject 2-14 Units into the skin 4 (four) times daily -  with meals and at bedtime. 201-250 = 2 units, 251-300 = 4 units, 301-350= 6 units, 351-400=8 units, 401-450=10 units, 451-500 =12 units >500 = 14 units     LEVEMIR 100 UNIT/ML injection Inject 53 Units into the skin at bedtime.      lisinopril-hydrochlorothiazide (ZESTORETIC) 20-12.5 MG tablet Take 2 tablets by mouth daily.     PAZEO 0.7 % SOLN Place 1 drop into both eyes daily.      polyethylene glycol (MIRALAX / GLYCOLAX) packet Take 17 g by mouth daily as needed for moderate constipation. (Patient taking differently: Take 17 g by mouth daily. ) 14 each 0   SitaGLIPtin-MetFORMIN HCl (JANUMET XR) 50-1000 MG TB24 Take 1 tablet by mouth daily.     Tetrahydrozoline HCl (VISINE OP) Place 1 drop into both eyes at bedtime.     vitamin C (ASCORBIC ACID) 500 MG tablet Take 500 mg by mouth daily.     Current Facility-Administered Medications  Medication Dose Route Frequency Provider Last Rate Last Admin   aflibercept (EYLEA) SOLN 2 mg  2 mg Intravitreal  Bernarda Caffey, MD   2 mg at  03/19/18 1621   Bevacizumab (AVASTIN) SOLN 1.25 mg  1.25 mg Intravitreal  Bernarda Caffey, MD   1.25 mg at 11/01/17 2300   Bevacizumab (AVASTIN) SOLN 1.25 mg  1.25 mg Intravitreal  Bernarda Caffey, MD   1.25 mg at 12/01/17 1437   Bevacizumab (AVASTIN) SOLN 1.25 mg  1.25 mg Intravitreal  Bernarda Caffey, MD   1.25 mg at 12/31/17 2354   Bevacizumab (AVASTIN) SOLN 1.25 mg  1.25 mg Intravitreal  Bernarda Caffey, MD   1.25 mg at 01/27/18 1508    REVIEW OF SYSTEMS:    .10 Point review of Systems was done is negative except as noted above.  PHYSICAL EXAMINATION: ECOG PERFORMANCE STATUS: 3 - Symptomatic, >50% confined to bed  . Vitals:   06/17/21 1504  BP: (!) 128/49  Pulse: 72  Resp: 14  Temp: 98.2 F (36.8 C)  SpO2: 97%    There were no vitals filed for this visit.  .There is no height or weight on file to calculate BMI.  NAD . GENERAL:alert, in no acute distress and comfortable SKIN: no acute rashes, no significant lesions EYES: conjunctiva are pink and non-injected, sclera anicteric OROPHARYNX: MMM, no exudates, no oropharyngeal erythema or ulceration NECK: supple, no  JVD LYMPH:  no palpable lymphadenopathy in the cervical, axillary or inguinal regions LUNGS: clear to auscultation b/l with normal respiratory effort HEART: regular rate & rhythm ABDOMEN:  normoactive bowel sounds , non tender, not distended. PSYCH: alert & oriented x 3 with fluent speech NEURO: no focal motor/sensory deficits   LABORATORY DATA:  I have reviewed the data as listed  . CBC Latest Ref Rng & Units 06/17/2021 12/14/2020 04/05/2019  WBC 4.0 - 10.5 K/uL 11.5(H) 11.7(H) 10.6(H)  Hemoglobin 12.0 - 15.0 g/dL 8.9(L) 9.8(L) 9.3(L)  Hematocrit 36.0 - 46.0 % 29.6(L) 32.6(L) 31.3(L)  Platelets 150 - 400 K/uL 260 281 300   . CBC    Component Value Date/Time   WBC 11.5 (H) 06/17/2021 1432   WBC 10.6 (H) 04/05/2019 1057   RBC 3.46 (L) 06/17/2021 1432   HGB 8.9 (L) 06/17/2021 1432   HCT 29.6 (L) 06/17/2021  1432   PLT 260 06/17/2021 1432   MCV 85.5 06/17/2021 1432   MCH 25.7 (L) 06/17/2021 1432   MCHC 30.1 06/17/2021 1432   RDW 16.1 (H) 06/17/2021 1432   LYMPHSABS 2.5 06/17/2021 1432   MONOABS 0.4 06/17/2021 1432   EOSABS 0.3 06/17/2021 1432   BASOSABS 0.1 06/17/2021 1432     . CMP Latest Ref Rng & Units 06/17/2021 12/14/2020 04/05/2019  Glucose 70 - 99 mg/dL 208(H) 265(H) 236(H)  BUN 8 - 23 mg/dL 30(H) 35(H) 22  Creatinine 0.60 - 1.20 mg/dL 1.16 1.31(H) 1.27(H)  Sodium 135 - 145 mmol/L 140 144 139  Potassium 3.5 - 5.1 mmol/L 4.6 4.7 4.7  Chloride 98 - 111 mmol/L 107 112(H) 103  CO2 22 - 32 mmol/L 25 25 26   Calcium 8.9 - 10.3 mg/dL 8.9 9.3 9.4  Total Protein 6.5 - 8.1 g/dL 6.7 7.2 7.1  Total Bilirubin 0.2 - 1.6 mg/dL 0.3 0.2(L) 0.3  Alkaline Phos 38 - 126 U/L 112 151(H) 143(H)  AST 11 - 38 U/L 8(L) 7(L) 10(L)  ALT 10 - 47 U/L 10 12 12    Component     Latest Ref Rng & Units 06/17/2021  IgG (Immunoglobin G), Serum     586 - 1,602 mg/dL 1,033  IgA     87 - 352 mg/dL 106  IgM (Immunoglobulin M), Srm     26 - 217 mg/dL 87  Total Protein ELP     6.0 - 8.5 g/dL 6.2  Albumin SerPl Elph-Mcnc     2.9 - 4.4 g/dL 3.1  Alpha 1     0.0 - 0.4 g/dL 0.2  Alpha2 Glob SerPl Elph-Mcnc     0.4 - 1.0 g/dL 0.9  B-Globulin SerPl Elph-Mcnc     0.7 - 1.3 g/dL 1.0  Gamma Glob SerPl Elph-Mcnc     0.4 - 1.8 g/dL 1.1  M Protein SerPl Elph-Mcnc     Not Observed g/dL Not Observed  Globulin, Total     2.2 - 3.9 g/dL 3.1  Albumin/Glob SerPl     0.7 - 1.7 1.1  IFE 1      Comment  Please Note (HCV):      Comment  Iron     41 - 142 ug/dL 30 (L)  TIBC     236 - 444 ug/dL 258  Saturation Ratios     21 - 57 % 12 (L)  UIBC     120 - 384 ug/dL 228  Kappa free light chain     3.3 - 19.4 mg/L 68.4 (H)  Lambda free light chains  5.7 - 26.3 mg/L 56.3 (H)  Kappa, lambda light chain ratio     0.26 - 1.65 1.21  Vitamin B12     180 - 914 pg/mL 1,264 (H)  LDH     98 - 192 U/L 89 (L)   Ferritin     11 - 307 ng/mL 126     RADIOGRAPHIC STUDIES: I have personally reviewed the radiological images as listed and agreed with the findings in the report. No results found.  ASSESSMENT & PLAN:   66 yo with   1) Normocytic Anemia-anemia of chronic disease due to poorly controlled diabetes and some element of chronic kidney disease.  PLAN: -Discussed pt labwork today, 06/17/2021 Labs today show stable chronic anemia with a hemoglobin of 8.9 which is close to her baseline of 8.5-9.5.  MCV of 85.5, WBC count of 11.5k and platelets of 260k CMP stable creatinine 1.16 blood sugar of 208, unremarkable LFTs Ferritin 126 with an iron saturation of 12%. B12 within normal limits 1264 LDH not elevated Myeloma panel with no M spike Serum kappa lambda free light chains within normal limits  -Would recommend increasing her iron polysaccharide 150 mg p.o. to twice daily to try to target a ferritin level of more than 200 and iron saturation of at least 20% . -Would recommend continuing B complex 1 capsule p.o. daily. -Optimize diabetes management with primary care physician -If the patient cannot tolerate p.o. iron or the ferritin and iron saturation goals are not obtainable with adequate p.o. iron then IV iron can be considered. -Once iron goals are reached is the patient is still significantly anemic then could consider EPO. Currently the patient's anemia is not limiting her already limited performance status.  FOLLOW UP: Return to clinic with Dr. Irene Limbo with labs in 6 months  . No orders of the defined types were placed in this encounter.   All of the patients questions were answered with apparent satisfaction. The patient knows to call the clinic with any problems, questions or concerns.   Sullivan Lone MD [place] AAHIVMS [facility] Hematology/Oncology Physician [facility]

## 2021-07-02 ENCOUNTER — Other Ambulatory Visit: Payer: Self-pay

## 2021-07-02 ENCOUNTER — Non-Acute Institutional Stay: Payer: Commercial Managed Care - HMO | Admitting: Hospice

## 2021-07-02 DIAGNOSIS — E11319 Type 2 diabetes mellitus with unspecified diabetic retinopathy without macular edema: Secondary | ICD-10-CM

## 2021-07-02 DIAGNOSIS — Z515 Encounter for palliative care: Secondary | ICD-10-CM

## 2021-07-02 DIAGNOSIS — D649 Anemia, unspecified: Secondary | ICD-10-CM

## 2021-07-02 DIAGNOSIS — G546 Phantom limb syndrome with pain: Secondary | ICD-10-CM

## 2021-07-02 NOTE — Progress Notes (Signed)
[facility] Consult Note Telephone: [phone]  Fax: [phone]  PATIENT NAME: [name] DOB: 14-Jul-1955 MRN: [id number]  PRIMARY CARE PROVIDER:  Dr. Jules Husbands REFERRING PROVIDER: Dr. Jules Husbands RESPONSIBLE PARTY:   Self Contact_ Niece Anna Genre [address]     Name Relation Home Work Mobile   [place] Niece [phone]    Edwinna Areola [phone]     Kandice, Schmelter [phone]         Visit is to build trust and highlight Palliative Medicine as specialized medical care for people living with serious illness, aimed at facilitating better quality of life through symptoms relief, assisting with advance care planning and complex medical decision making. This is a follow up visit.  RECOMMENDATIONS/PLAN:   Advance Care Planning/Code Status: Patient is an Full code  Goals of Care: Goals of care include to maximize quality of life and symptom management.  Visit consisted of counseling and education dealing with the complex and emotionally intense issues of symptom management and palliative care in the setting of serious and potentially life-threatening illness. Palliative care team will continue to support patient, patient's family, and medical team.  Symptom management/Plan:  Normocytic anemia: chronic current hgb 9.16 Feb 2021, no bleeding. Continue ferrous sulfate 325 mg daily with vitamin C 500 mg daily.  Repeat CBC to see if hemoglobin improving.  Patient recently seen by Oncology. Continue follow up appointments as planned.  Type 2 diabetes mellitus well managed.  Last A1c Nov 2022 is 7.4.  Continue insulin as currently ordered.  Continue current diet, no concentrated sweets.  Monitor for hypoglycemic/hyperglycemic events and notify provider accordingly. Repeat A1c. Phantom limb pain: Managed with Gabapentin Follow up: Palliative care will continue to  follow for complex medical decision making, advance care planning, and clarification of goals. Return 6 weeks or prn. Encouraged to call provider sooner with any concerns.  CHIEF COMPLAINT: Palliative follow up  HISTORY OF PRESENT ILLNESS: [name] is a 66 y.o. female with multiple medical problems including chronic normocytic anemia, HTN, type 2 diabetes mellitus, chronic, insulin-dependent with renal complication and retinopathy.  History of BKA, CKD 3, HLD,  CVA. No fall or hospitalization since last visit. Patient denies pain/discomfort, endorses weakness, denies dizziness/palpitations.  Appointment with audiologist today.  History obtained from review of EMR, discussion with primary team, family and/or patient. Records reviewed and summarized above. All 10 point systems reviewed and are negative.   Review and summarization of Epic records shows history from other than patient.   Palliative Care was asked to follow this patient of help address complex decision making in the context of advance care planning and goals of care clarification.   Latest Reference Range & Units Most Recent 04/05/19 10:57 12/14/20 10:21  RBC 3.87 - 5.11 MIL/uL 3.73 (L) 12/14/20 10:21 3.82 (L) 3.73 (L)  Hemoglobin 12.0 - 15.0 g/dL 9.8 (L) 12/14/20 10:21 9.3 (L) 9.8 (L)    PHYSICAL EXAM  General: In no acute distress,cooperative Cardiovascular: regular rate and rhythm Pulmonary: no cough, no increased work of breathing, normal respiratory effort Abdomen: soft, non tender, positive bowel sounds in all quadrants GU:  no suprapubic tenderness Eyes: Normal lids, no discharge, sclera anicteric ENMT: Moist mucous membranes Musculoskeletal: Bilateral AKA Skin: no rash to visible skin, warm without cyanosis Psych: non-anxious affect Neurological: Weakness but otherwise non focal, alert and oriented x4 Heme/lymph/immuno: no bruises, no bleeding  PERTINENT MEDICATIONS:  Outpatient Encounter Medications as  of  07/02/2021  Medication Sig   albuterol (VENTOLIN HFA) 108 (90 Base) MCG/ACT inhaler Inhale 2 puffs into the lungs every 4 (four) hours as needed for wheezing or shortness of breath.   amLODipine (NORVASC) 2.5 MG tablet Take 2.5 mg by mouth daily.   aspirin EC 81 MG tablet Take 81 mg by mouth daily.   atorvastatin (LIPITOR) 20 MG tablet Take 20 mg by mouth daily.   carvedilol (COREG) 12.5 MG tablet Take 12.5 mg by mouth 2 (two) times daily.    cetirizine (ZYRTEC) 10 MG tablet Take 10 mg by mouth daily.   Cholecalciferol (VITAMIN D) 50 MCG (2000 UT) tablet Take 2,000 Units by mouth daily.   cyanocobalamin 1000 MCG tablet Take 1,000 mcg by mouth daily.   docusate sodium 100 MG CAPS Take 100 mg by mouth 2 (two) times daily.   ferrous sulfate 325 (65 FE) MG tablet Take 325 mg by mouth daily with breakfast.   gabapentin (NEURONTIN) 100 MG capsule Take 100 mg by mouth See admin instructions. Take with 400 mg morning dose to equal 500 mg in the morning, take with 600 mg night dose to equal 700 mg at night   gabapentin (NEURONTIN) 400 MG capsule Take 400 mg by mouth 2 (two) times daily.    gabapentin (NEURONTIN) 600 MG tablet Take 600 mg by mouth at bedtime.   hydrALAZINE (APRESOLINE) 25 MG tablet Take 75 mg by mouth 3 (three) times daily.   HYDROcodone-acetaminophen (NORCO/VICODIN) 5-325 MG tablet Take 1 tablet by mouth every 8 (eight) hours as needed for moderate pain.    insulin lispro (HUMALOG) 100 UNIT/ML injection Inject 2-14 Units into the skin 4 (four) times daily -  with meals and at bedtime. 201-250 = 2 units, 251-300 = 4 units, 301-350= 6 units, 351-400=8 units, 401-450=10 units, 451-500 =12 units >500 = 14 units   LEVEMIR 100 UNIT/ML injection Inject 53 Units into the skin at bedtime.    lisinopril-hydrochlorothiazide (ZESTORETIC) 20-12.5 MG tablet Take 2 tablets by mouth daily.   PAZEO 0.7 % SOLN Place 1 drop into both eyes daily.    polyethylene glycol (MIRALAX / GLYCOLAX) packet Take 17 g  by mouth daily as needed for moderate constipation. (Patient taking differently: Take 17 g by mouth daily. )   SitaGLIPtin-MetFORMIN HCl (JANUMET XR) 50-1000 MG TB24 Take 1 tablet by mouth daily.   Tetrahydrozoline HCl (VISINE OP) Place 1 drop into both eyes at bedtime.   vitamin C (ASCORBIC ACID) 500 MG tablet Take 500 mg by mouth daily.   Facility-Administered Encounter Medications as of 07/02/2021  Medication   aflibercept (EYLEA) SOLN 2 mg   Bevacizumab (AVASTIN) SOLN 1.25 mg   Bevacizumab (AVASTIN) SOLN 1.25 mg   Bevacizumab (AVASTIN) SOLN 1.25 mg   Bevacizumab (AVASTIN) SOLN 1.25 mg    HOSPICE ELIGIBILITY/DIAGNOSIS: TBD  PAST MEDICAL HISTORY:  Past Medical History:  Diagnosis Date   Atypical chest pain    a. non obstructive by cath in 2008 and 2010;  b. 02/2012 Myoview: non-ischemic, EF 57%   Cellulitis    a. left foot-> s/p L BKA   Chronic kidney disease    Constipation    CVA (cerebral infarction)    a.  Small right parietal noted incidentally 04/2007;  b. right sided embolic CVA 11/6193;  c. TEE 2/14:  LVH, EF 55-60%, mild LAE, no LAA clot, no PFO, no R->L shunt by echo contrast, oscillating density on AV likely Lambl's Excressence    Diabetes mellitus,  type II ([place])    Diabetic retinopathy ([place])    NPDR w/edema OU   Diaphragmatic hernia without mention of obstruction or gangrene    Dry skin    Esophageal reflux    HTN (hypertension)    Hx of BKA ([facility])    Bilateral   Hyperlipidemia    Irritable bowel syndrome    Morbid obesity ([place])    Obesity, unspecified    Pain in shoulder 06/28/2015   Peripheral neuropathy    Stroke [facility])    2013-'no residual'   Syncope and collapse    a. near-syncopal episode in November 2008;  b. 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   Tobacco abuse    Vertigo      ALLERGIES:  Allergies  Allergen Reactions   Banana Swelling and Other (See Comments)    Facial swelling   Penicillins Itching, Swelling and Rash    Face swells and break  out   Strawberry Extract Swelling and Other (See Comments)    Facial swelling      I spent  45 minutes providing this consultation; this includes time spent with patient/family, chart review and documentation. More than 50% of the time in this consultation was spent on counseling and coordinating communication   Thank you for the opportunity to participate in the care of SHAMAR KRACKE Please call our office at [phone] if we can be of additional assistance.  Note: Portions of this note were generated with Lobby[employer]. Dictation errors may occur despite best attempts at proofreading.  Teodoro Spray, NP

## 2021-07-22 ENCOUNTER — Encounter (HOSPITAL_BASED_OUTPATIENT_CLINIC_OR_DEPARTMENT_OTHER): Payer: Self-pay | Admitting: *Deleted

## 2021-07-22 ENCOUNTER — Emergency Department (HOSPITAL_BASED_OUTPATIENT_CLINIC_OR_DEPARTMENT_OTHER): Payer: Medicare Other

## 2021-07-22 ENCOUNTER — Emergency Department (HOSPITAL_BASED_OUTPATIENT_CLINIC_OR_DEPARTMENT_OTHER)
Admission: EM | Admit: 2021-07-22 | Discharge: 2021-07-22 | Disposition: A | Discharge status: Home / Self Care | Payer: Medicare Other | Attending: Emergency Medicine | Admitting: Emergency Medicine

## 2021-07-22 ENCOUNTER — Other Ambulatory Visit: Payer: Self-pay

## 2021-07-22 DIAGNOSIS — R519 Headache, unspecified: Secondary | ICD-10-CM | POA: Diagnosis not present

## 2021-07-22 DIAGNOSIS — W1789XA Other fall from one level to another, initial encounter: Secondary | ICD-10-CM | POA: Diagnosis not present

## 2021-07-22 DIAGNOSIS — Z043 Encounter for examination and observation following other accident: Secondary | ICD-10-CM | POA: Diagnosis not present

## 2021-07-22 DIAGNOSIS — W19XXXA Unspecified fall, initial encounter: Secondary | ICD-10-CM

## 2021-07-22 LAB — CBG MONITORING, ED: Glucose-Capillary: 122 mg/dL — ABNORMAL HIGH (ref 70–99)

## 2021-07-22 MED ORDER — ACETAMINOPHEN 325 MG PO TABS
650.0000 mg | ORAL_TABLET | Freq: Once | ORAL | Status: AC
  Administered 2021-07-22: 650 mg via ORAL
  Filled 2021-07-22: qty 2

## 2021-07-22 NOTE — ED Notes (Signed)
[facility] called, patient is next on list ?

## 2021-07-22 NOTE — ED Notes (Signed)
ED Provider at bedside. 

## 2021-07-22 NOTE — Discharge Instructions (Signed)
It was a pleasure taking care of you today!  ? ?Your CT scans were unremarkable today. You may follow up with your primary care provider as needed. Return to the Emergency Department if you are experiencing increasing/worsening headache, vision changes, vomiting, or worsening symptoms. ?

## 2021-07-22 NOTE — ED Provider Notes (Signed)
[facility] EMERGENCY DEPT Provider Note   CSN: [id number] Arrival date & time: 07/22/21  1823     History  Chief Complaint  Patient presents with   Lytle Michaels    [name] is a 66 y.o. female who presents to the emergency department brought in by EMS with concerns for fall onset prior to arrival.  Patient is from [employer].  Reported per EMS that she fell approximately 2 feet from a [employer] lift 5 hours ago.  Patient notes that she hit the back of her head.  Has not been given any medications for her symptoms.  Denies chest pain, shortness of breath, abdominal pain, nausea, vomiting, vision changes, neck pain, back pain.  The history is provided by the patient. No language interpreter was used.      Home Medications Prior to Admission medications   Medication Sig Start Date End Date Taking? Authorizing Provider  albuterol (VENTOLIN HFA) 108 (90 Base) MCG/ACT inhaler Inhale 2 puffs into the lungs every 4 (four) hours as needed for wheezing or shortness of breath.    [provider]  amLODipine (NORVASC) 2.5 MG tablet Take 2.5 mg by mouth daily. 06/14/18   [provider]  aspirin EC 81 MG tablet Take 81 mg by mouth daily.    [provider]  atorvastatin (LIPITOR) 20 MG tablet Take 20 mg by mouth daily. 05/22/18   [provider]  carvedilol (COREG) 12.5 MG tablet Take 12.5 mg by mouth 2 (two) times daily.  06/08/18   [provider]  cetirizine (ZYRTEC) 10 MG tablet Take 10 mg by mouth daily.    [provider]  Cholecalciferol (VITAMIN D) 50 MCG (2000 UT) tablet Take 2,000 Units by mouth daily.    [provider]  cyanocobalamin 1000 MCG tablet Take 1,000 mcg by mouth daily.    [provider]  docusate sodium 100 MG CAPS Take 100 mg by mouth 2 (two) times daily. 11/04/13   Debbe Odea, MD  ferrous sulfate 325 (65 FE) MG tablet Take 325 mg by mouth daily with breakfast.    [provider]  gabapentin (NEURONTIN) 100 MG capsule Take 100 mg by mouth See admin instructions. Take with 400 mg morning dose to equal 500 mg in the morning, take with 600 mg night dose to equal 700 mg at night    [provider]  gabapentin (NEURONTIN) 400 MG capsule Take 400 mg by mouth 2 (two) times daily.  06/02/18   [provider]  gabapentin (NEURONTIN) 600 MG tablet Take 600 mg by mouth at bedtime.    [provider]  hydrALAZINE (APRESOLINE) 25 MG tablet Take 75 mg by mouth 3 (three) times daily.    [provider]  HYDROcodone-acetaminophen (NORCO/VICODIN) 5-325 MG tablet Take 1 tablet by mouth every 8 (eight) hours as needed for moderate pain.     [provider]  insulin lispro (HUMALOG) 100 UNIT/ML injection Inject 2-14 Units into the skin 4 (four) times daily -  with meals and at bedtime. 201-250 = 2 units, 251-300 = 4 units, 301-350= 6 units, 351-400=8 units, 401-450=10 units, 451-500 =12 units >500 = 14 units    [provider]  LEVEMIR 100 UNIT/ML injection Inject 53 Units into the skin at bedtime.  06/02/18   [provider]  lisinopril-hydrochlorothiazide (ZESTORETIC) 20-12.5 MG tablet Take 2 tablets by mouth daily.    [provider]  PAZEO 0.7 % SOLN Place 1 drop into  both eyes daily.  05/27/18   [provider]  polyethylene glycol (MIRALAX / GLYCOLAX) packet Take 17 g by mouth daily as needed for moderate constipation. Patient taking differently: Take 17 g by mouth daily.  11/04/13   Debbe Odea, MD  SitaGLIPtin-MetFORMIN HCl (JANUMET XR) 50-1000 MG TB24 Take 1 tablet by mouth daily.    [provider]  Tetrahydrozoline HCl (VISINE OP) Place 1 drop into both eyes at bedtime.    [provider]  vitamin C (ASCORBIC ACID) 500 MG tablet Take 500 mg by mouth daily.    [provider]      Allergies    Banana, Penicillins, and Strawberry extract    Review of Systems    Review of Systems  Constitutional:  Negative for chills and fever.  Eyes:  Negative for visual disturbance.  Respiratory:  Negative for shortness of breath.   Cardiovascular:  Negative for chest pain.  Gastrointestinal:  Negative for abdominal pain, nausea and vomiting.  Musculoskeletal:  Negative for back pain and neck pain.  Skin:  Negative for rash.  Neurological:  Positive for headaches.  All other systems reviewed and are negative.  Physical Exam Updated Vital Signs BP 133/73 (BP Location: Left Wrist)    Pulse 76    Resp 16    Wt 110.2 kg    SpO2 98%    BMI 39.22 kg/m  Physical Exam Vitals and nursing note reviewed.  Constitutional:      General: She is not in acute distress.    Appearance: She is not diaphoretic.  HENT:     Head: Normocephalic and atraumatic.     Comments: No tenderness to palpation noted to head.    Mouth/Throat:     Mouth: Mucous membranes are moist.     Pharynx: Oropharynx is clear. No oropharyngeal exudate.  Eyes:     General: No scleral icterus.    Extraocular Movements: Extraocular movements intact.     Conjunctiva/sclera: Conjunctivae normal.     Pupils: Pupils are equal, round, and reactive to light.  Cardiovascular:     Rate and Rhythm: Normal rate and regular rhythm.     Pulses: Normal pulses.     Heart sounds: Normal heart sounds.  Pulmonary:     Effort: Pulmonary effort is normal. No respiratory distress.     Breath sounds: Normal breath sounds. No wheezing.  Abdominal:     General: Bowel sounds are normal.     Palpations: Abdomen is soft. There is no mass.     Tenderness: There is no abdominal tenderness. There is no guarding or rebound.  Musculoskeletal:        General: Normal range of motion.     Cervical back: Normal range of motion and neck supple.     Comments: Grip strength 5/5 bilaterally.  Sensation intact to bilateral upper and lower extremities.  Bilateral AKA noted.  Radial pulses 2+ bilaterally.  No C, T, L, S spinal  tenderness to palpation.  No tenderness to palpation noted to musculature of back.  Skin:    General: Skin is warm and dry.  Neurological:     Mental Status: She is alert.  Psychiatric:        Behavior: Behavior normal.    ED Results / Procedures / Treatments   Labs (all labs ordered are listed, but only abnormal results are displayed) Labs Reviewed - No data to display  EKG None  Radiology No results found.  Procedures Procedures  Medications Ordered in ED Medications - No data to display  ED Course/ Medical Decision Making/ A&P Clinical Course as of 07/23/21 2137  Mon Jul 22, 2021  2014 Patient reevaluated and noted improvement of her symptoms with Tylenol in the ED.  Discussed imaging findings with patient at bedside.  Discussed discharge treatment plan.  Patient appears safe for discharge. [SB]  2033 Notified by paramedic that patient's family noted that patient's eyes appeared to be rolling in the back of her head.  We will check a CBG. [SB]  2048 Glucose-Capillary(!): 122 Doubt hypoglycemia at this time. [SB]    Clinical Course User Index [SB] Olawale Marney A, PA-C                           Medical Decision Making Amount and/or Complexity of Data Reviewed Labs:  Decision-making details documented in ED Course. Radiology: ordered.  Risk OTC drugs.   Pt with fall occurring prior to arrival.  Patient was in the [place] when she fell approximately 2 feet from the [place] and hit her head.  Denies LOC, vision changes, vomiting.  Patient from [employer].  Patient denies pain anywhere at this time.  Denies anticoagulant use.  On exam patient without tenderness to palpation noted to head, no spinal tenderness to palpation.  No overlying skin changes.  Grip strength 5/5 bilaterally.  Sensation intact to bilateral upper and lower extremities.  Bilateral AKA noted.  Radial pulses 2+ bilaterally.  Differential diagnosis includes SAH, ICH, strain, fracture,  dislocation, herniation.    Imaging: I ordered imaging studies including CT head, CT cervical spine I independently visualized and interpreted imaging which showed:  CT cervical spine:  No acute fracture or subluxation of the cervical spine.  2. Marked severity multilevel degenerative changes, as described  above.   CT head: 1. Generalized cerebral atrophy.  2. No acute intracranial abnormality.   I agree with the radiologist interpretation  Medications:  I ordered medication including Tylenol for symptom management Reevaluation of the patient after these medicines and interventions, I reevaluated the patient and found that they have improved I have reviewed the patients home medicines and have made adjustments as needed   Disposition: Patient presentation suspicious for musculoskeletal pain following injury.  Doubt SAH, ICH, fracture, dislocation, herniation at this time. After consideration of the diagnostic results and the patients response to treatment, I feel that the patient would benefit from Discharge home.  Supportive care and return precautions discussed with patient and significant other. Return precautions given to the patient including shortness of breath, worsening chest pain, or worsening headache. Appears safe for discharge at this time. Follow up as indicated in discharge paperwork.   This chart was dictated using voice recognition software, Dragon. Despite the best efforts of this provider to proofread and correct errors, errors may still occur which can change documentation meaning.  Final Clinical Impression(s) / ED Diagnoses Final diagnoses:  Fall, initial encounter    Rx / DC Orders ED Discharge Orders     None         Marshaun Lortie A, PA-C 07/23/21 2138    Regan Lemming, MD 07/23/21 2245

## 2021-07-22 NOTE — ED Notes (Signed)
Pt was being transferred in hoyer lift when there was a 8f fall without injury or pain.  Pt was transferred here by ems for eval per protocol.  ?

## 2021-07-22 NOTE — ED Triage Notes (Signed)
Pt fell 2 feet from hoyer lift 5 hours ago, no injury.  No pain ?

## 2021-07-22 NOTE — ED Notes (Signed)
Attempted to call report. No answer.

## 2021-09-27 ENCOUNTER — Non-Acute Institutional Stay: Payer: Commercial Managed Care - HMO | Admitting: Hospice

## 2021-09-27 DIAGNOSIS — Z515 Encounter for palliative care: Secondary | ICD-10-CM

## 2021-09-27 DIAGNOSIS — D649 Anemia, unspecified: Secondary | ICD-10-CM

## 2021-09-27 DIAGNOSIS — E1121 Type 2 diabetes mellitus with diabetic nephropathy: Secondary | ICD-10-CM

## 2021-09-27 DIAGNOSIS — G546 Phantom limb syndrome with pain: Secondary | ICD-10-CM

## 2021-09-27 NOTE — Progress Notes (Signed)
? ? ?Manufacturing engineer ?[facility] Consult Note ?Telephone: [phone]  ?Fax: [phone] ? ?PATIENT NAME: [name] ?DOB: May 15, 1956 ?MRN: [id number] ? ?PRIMARY CARE PROVIDER:  Dr. Jules Husbands ?REFERRING PROVIDER: Dr. Jules Husbands ?RESPONSIBLE PARTY:   Self ?Contact_ Niece Anna Genre [phone]  ?Contact Information   ? ? Name Relation Home Work Mobile  ? Arturo Morton Niece [phone]   ? Luiz Blare Sister [phone]    ? Noelani, [place] [phone]    ? ?  ? ? ?Visit is to build trust and highlight Palliative Medicine as specialized medical care for people living with serious illness, aimed at facilitating better quality of life through symptoms relief, assisting with advance care planning and complex medical decision making. This is a follow up visit. ? ?RECOMMENDATIONS/PLAN:  ? ?Advance Care Planning/Code Status: Patient is an Full code ? ?Goals of Care: Goals of care include to maximize quality of life and symptom management. ? ?Visit consisted of counseling and education dealing with the complex and emotionally intense issues of symptom management and palliative care in the setting of serious and potentially life-threatening illness. Palliative care team will continue to support patient, patient's family, and medical team. ? ?Symptom management/Plan:  ?Normocytic anemia: chronic,  current hgb 9.3 07/19/21, 9.16 Feb 2021, no bleeding. Continue ferrous sulfate 325 mg daily with vitamin C 500 mg daily.  Routine CBC BMP.  Patient recently followed by Oncology. Continue follow up appointments as planned.  ?Type 2 diabetes mellitus well managed.  Current A1c 8.0.  Continue insulin, metformin and dulaglutide as currently ordered.  Continue current diabetic diet, no concentrated sweets.  Monitor for hypoglycemic/hyperglycemic events and notify provider accordingly. Repeat A1c in 3 months ?Phantom limb pain: Stable.  Managed with Gabapentin ?Follow  up: Palliative care will continue to follow for complex medical decision making, advance care planning, and clarification of goals. Return 6 weeks or prn. Encouraged to call provider sooner with any concerns. ? ?CHIEF COMPLAINT: Palliative follow up ? ?HISTORY OF PRESENT ILLNESS: [name] is a 66 y.o. female with multiple medical problems including chronic normocytic anemia, HTN, type 2 diabetes mellitus, chronic, insulin-dependent with renal complication and retinopathy.  History of BKA, CKD 3, HLD,  CVA. No fall or hospitalization since last visit. Patient denies pain/discomfort, endorses weakness, denies dizziness/palpitations.  Appointment with audiologist today.  History obtained from review of EMR, discussion with primary team, family and/or patient. Records reviewed and summarized above. All 10 point systems reviewed and are negative.  ? ?Review and summarization of Epic records shows history from other than patient.  ? ?Palliative Care was asked to follow this patient of help address complex decision making in the context of advance care planning and goals of care clarification.  ? ?PHYSICAL EXAM  ?General: In no acute distress,cooperative ?Cardiovascular: regular rate and rhythm ?Pulmonary: no cough, no increased work of breathing, normal respiratory effort ?Abdomen: soft, non tender, positive bowel sounds in all quadrants ?GU:  no suprapubic tenderness ?Eyes: Normal lids, no discharge, sclera anicteric ?ENMT: Moist mucous membranes ?Musculoskeletal: Bilateral AKA ?Skin: no rash to visible skin, warm without cyanosis ?Psych: non-anxious affect ?Neurological: Weakness but otherwise non focal, alert and oriented x4 ?Heme/lymph/immuno: no bruises, no bleeding ? ?PERTINENT MEDICATIONS:  ?Outpatient Encounter Medications as of 09/27/2021  ?Medication Sig  ? albuterol (VENTOLIN HFA) 108 (90 Base) MCG/ACT inhaler Inhale 2 puffs into the lungs every 4 (four) hours as needed for wheezing or shortness of  breath.  ? amLODipine (  NORVASC) 2.5 MG tablet Take 2.5 mg by mouth daily.  ? aspirin EC 81 MG tablet Take 81 mg by mouth daily.  ? atorvastatin (LIPITOR) 20 MG tablet Take 20 mg by mouth daily.  ? carvedilol (COREG) 12.5 MG tablet Take 12.5 mg by mouth 2 (two) times daily.   ? cetirizine (ZYRTEC) 10 MG tablet Take 10 mg by mouth daily.  ? Cholecalciferol (VITAMIN D) 50 MCG (2000 UT) tablet Take 2,000 Units by mouth daily.  ? cyanocobalamin 1000 MCG tablet Take 1,000 mcg by mouth daily.  ? docusate sodium 100 MG CAPS Take 100 mg by mouth 2 (two) times daily.  ? ferrous sulfate 325 (65 FE) MG tablet Take 325 mg by mouth daily with breakfast.  ? gabapentin (NEURONTIN) 100 MG capsule Take 100 mg by mouth See admin instructions. Take with 400 mg morning dose to equal 500 mg in the morning, take with 600 mg night dose to equal 700 mg at night  ? gabapentin (NEURONTIN) 400 MG capsule Take 400 mg by mouth 2 (two) times daily.   ? gabapentin (NEURONTIN) 600 MG tablet Take 600 mg by mouth at bedtime.  ? hydrALAZINE (APRESOLINE) 25 MG tablet Take 75 mg by mouth 3 (three) times daily.  ? HYDROcodone-acetaminophen (NORCO/VICODIN) 5-325 MG tablet Take 1 tablet by mouth every 8 (eight) hours as needed for moderate pain.   ? insulin lispro (HUMALOG) 100 UNIT/ML injection Inject 2-14 Units into the skin 4 (four) times daily -  with meals and at bedtime. 201-250 = 2 units, 251-300 = 4 units, 301-350= 6 units, 351-400=8 units, 401-450=10 units, 451-500 =12 units >500 = 14 units  ? LEVEMIR 100 UNIT/ML injection Inject 53 Units into the skin at bedtime.   ? lisinopril-hydrochlorothiazide (ZESTORETIC) 20-12.5 MG tablet Take 2 tablets by mouth daily.  ? PAZEO 0.7 % SOLN Place 1 drop into both eyes daily.   ? polyethylene glycol (MIRALAX / GLYCOLAX) packet Take 17 g by mouth daily as needed for moderate constipation. (Patient taking differently: Take 17 g by mouth daily. )  ? SitaGLIPtin-MetFORMIN HCl (JANUMET XR) 50-1000 MG TB24 Take 1  tablet by mouth daily.  ? Tetrahydrozoline HCl (VISINE OP) Place 1 drop into both eyes at bedtime.  ? vitamin C (ASCORBIC ACID) 500 MG tablet Take 500 mg by mouth daily.  ? ?Facility-Administered Encounter Medications as of 09/27/2021  ?Medication  ? aflibercept (EYLEA) SOLN 2 mg  ? Bevacizumab (AVASTIN) SOLN 1.25 mg  ? Bevacizumab (AVASTIN) SOLN 1.25 mg  ? Bevacizumab (AVASTIN) SOLN 1.25 mg  ? Bevacizumab (AVASTIN) SOLN 1.25 mg  ? ? ?HOSPICE ELIGIBILITY/DIAGNOSIS: TBD ? ?PAST MEDICAL HISTORY:  ?Past Medical History:  ?Diagnosis Date  ? Atypical chest pain   ? a. non obstructive by cath in 2008 and 2010;  b. 02/2012 Myoview: non-ischemic, EF 57%  ? Cellulitis   ? a. left foot-> s/p L BKA  ? Chronic kidney disease   ? Constipation   ? CVA (cerebral infarction)   ? a.  Small right parietal noted incidentally 04/2007;  b. right sided embolic CVA 0/0174;  c. 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 Diabetes mellitus, type II ([place])   ? Diabetic retinopathy ([place])   ? NPDR w/edema OU  ? Diaphragmatic hernia without mention of obstruction or gangrene   ? Dry skin   ? Esophageal reflux   ?  HTN (hypertension)   ? Hx of BKA ([place])   ? Bilateral  ? Hyperlipidemia   ? Irritable bowel syndrome   ? Morbid obesity ([place])   ? Obesity, unspecified   ? Pain in shoulder 06/28/2015  ? Peripheral neuropathy   ? Stroke [facility])   ? 2013-'no residual'  ? Syncope and collapse   ? a. near-syncopal episode in November 2008;  b. 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  ? Tobacco abuse   ? Vertigo   ?  ? ?ALLERGIES:  ?Allergies  ?Allergen Reactions  ? Banana Swelling and Other (See Comments)  ?  Facial swelling  ? Penicillins Itching, Swelling and Rash  ?  Face swells and break out  ? Strawberry Extract Swelling and Other (See Comments)  ?  Facial swelling  ?   ? ?I spent  45 minutes providing this consultation; this includes time spent with  patient/family, chart review and documentation. More than 50% of the time in this consultation was spent on counseling and coordinating communication  ? ?Thank you for the opportunity to participate in the care of C

## 2021-12-06 ENCOUNTER — Non-Acute Institutional Stay: Payer: Commercial Managed Care - HMO | Admitting: Hospice

## 2021-12-06 DIAGNOSIS — G546 Phantom limb syndrome with pain: Secondary | ICD-10-CM

## 2021-12-06 DIAGNOSIS — E1121 Type 2 diabetes mellitus with diabetic nephropathy: Secondary | ICD-10-CM

## 2021-12-06 DIAGNOSIS — D649 Anemia, unspecified: Secondary | ICD-10-CM

## 2021-12-06 DIAGNOSIS — Z515 Encounter for palliative care: Secondary | ICD-10-CM

## 2021-12-06 NOTE — Progress Notes (Signed)
[place] Consult Note Telephone: [phone]  Fax: [phone]  PATIENT NAME: [name] DOB: 1955/08/18 MRN: [id number]  PRIMARY CARE PROVIDER:  Dr. Jules Husbands REFERRING PROVIDER: Dr. Jules Husbands RESPONSIBLE PARTY:   Self Contact_ Niece Anna Genre [address]     Name Relation Home Work Mobile   [place] Niece [phone]    Edwinna Areola [phone]     Cheila, [place] [phone]         Visit is to build trust and highlight Palliative Medicine as specialized medical care for people living with serious illness, aimed at facilitating better quality of life through symptoms relief, assisting with advance care planning and complex medical decision making. This is a follow up visit.  RECOMMENDATIONS/PLAN:   Advance Care Planning/Code Status: Patient is an Full code  Goals of Care: Goals of care include to maximize quality of life and symptom management.  Visit consisted of counseling and education dealing with the complex and emotionally intense issues of symptom management and palliative care in the setting of serious and potentially life-threatening illness. Palliative care team will continue to support patient, patient's family, and medical team.  Symptom management/Plan:  Normocytic anemia: chronic,  current hgb 9.3 07/19/21, no bleeding. Continue ferrous sulfate 325 mg daily with vitamin C 500 mg daily.  Routine CBC BMP.  Patient recently followed by Oncology. Continue follow up appointments as planned.  Type 2 diabetes mellitus: Current A1c 7.23 November 2021.  Continue insulin, metformin and dulaglutide as currently ordered.  Continue current diabetic diet, no concentrated sweets.  Monitor for hypoglycemic/hyperglycemic events and notify provider accordingly. Repeat A1c in 3 months Phantom limb pain: Stable, with no exacerbation.  Managed with  Gabapentin Follow up: Palliative care will continue to follow for complex medical decision making, advance care planning, and clarification of goals. Return 6 weeks or prn. Encouraged to call provider sooner with any concerns.  CHIEF COMPLAINT: Palliative follow up  HISTORY OF PRESENT ILLNESS: [name] is a 66 y.o. female with multiple medical problems including chronic normocytic anemia, HTN, type 2 diabetes mellitus, chronic, insulin-dependent with renal complication and retinopathy.  History of BKA, CKD 3, HLD,  CVA. No fall or hospitalization since last visit. Patient in good spirits, denies pain/discomfort, endorses weakness, denies dizziness/palpitations.  Appointment with audiologist today.  History obtained from review of EMR, discussion with primary team, family and/or patient. Records reviewed and summarized above. All 10 point systems reviewed and are negative.   Review and summarization of Epic records shows history from other than patient.   Palliative Care was asked to follow this patient of help address complex decision making in the context of advance care planning and goals of care clarification.   PERTINENT MEDICATIONS:  Outpatient Encounter Medications as of 12/06/2021  Medication Sig   albuterol (VENTOLIN HFA) 108 (90 Base) MCG/ACT inhaler Inhale 2 puffs into the lungs every 4 (four) hours as needed for wheezing or shortness of breath.   amLODipine (NORVASC) 2.5 MG tablet Take 2.5 mg by mouth daily.   aspirin EC 81 MG tablet Take 81 mg by mouth daily.   atorvastatin (LIPITOR) 20 MG tablet Take 20 mg by mouth daily.   carvedilol (COREG) 12.5 MG tablet Take 12.5 mg by mouth 2 (two) times daily.    cetirizine (ZYRTEC) 10 MG tablet Take 10 mg by mouth daily.   Cholecalciferol (VITAMIN D) 50 MCG (2000 UT) tablet Take 2,000 Units by  mouth daily.   cyanocobalamin 1000 MCG tablet Take 1,000 mcg by mouth daily.   docusate sodium 100 MG CAPS Take 100 mg by mouth 2 (two) times  daily.   ferrous sulfate 325 (65 FE) MG tablet Take 325 mg by mouth daily with breakfast.   gabapentin (NEURONTIN) 100 MG capsule Take 100 mg by mouth See admin instructions. Take with 400 mg [name] dose to equal 500 mg in the [name], take with 600 mg night dose to equal 700 mg at night   gabapentin (NEURONTIN) 400 MG capsule Take 400 mg by mouth 2 (two) times daily.    gabapentin (NEURONTIN) 600 MG tablet Take 600 mg by mouth at bedtime.   hydrALAZINE (APRESOLINE) 25 MG tablet Take 75 mg by mouth 3 (three) times daily.   HYDROcodone-acetaminophen (NORCO/VICODIN) 5-325 MG tablet Take 1 tablet by mouth every 8 (eight) hours as needed for moderate pain.    insulin lispro (HUMALOG) 100 UNIT/ML injection Inject 2-14 Units into the skin 4 (four) times daily -  with meals and at bedtime. 201-250 = 2 units, 251-300 = 4 units, 301-350= 6 units, 351-400=8 units, 401-450=10 units, 451-500 =12 units >500 = 14 units   LEVEMIR 100 UNIT/ML injection Inject 53 Units into the skin at bedtime.    lisinopril-hydrochlorothiazide (ZESTORETIC) 20-12.5 MG tablet Take 2 tablets by mouth daily.   PAZEO 0.7 % SOLN Place 1 drop into both eyes daily.    polyethylene glycol (MIRALAX / GLYCOLAX) packet Take 17 g by mouth daily as needed for moderate constipation. (Patient taking differently: Take 17 g by mouth daily. )   SitaGLIPtin-MetFORMIN HCl (JANUMET XR) 50-1000 MG TB24 Take 1 tablet by mouth daily.   Tetrahydrozoline HCl (VISINE OP) Place 1 drop into both eyes at bedtime.   vitamin C (ASCORBIC ACID) 500 MG tablet Take 500 mg by mouth daily.   Facility-Administered Encounter Medications as of 12/06/2021  Medication   aflibercept (EYLEA) SOLN 2 mg   Bevacizumab (AVASTIN) SOLN 1.25 mg   Bevacizumab (AVASTIN) SOLN 1.25 mg   Bevacizumab (AVASTIN) SOLN 1.25 mg   Bevacizumab (AVASTIN) SOLN 1.25 mg    HOSPICE ELIGIBILITY/DIAGNOSIS: TBD  PAST MEDICAL HISTORY:  Past Medical History:  Diagnosis Date   Atypical chest  pain    a. non obstructive by cath in 2008 and 2010;  b. 02/2012 Myoview: non-ischemic, EF 57%   Cellulitis    a. left foot-> s/p L BKA   Chronic kidney disease    Constipation    CVA (cerebral infarction)    a.  Small right parietal noted incidentally 04/2007;  b. right sided embolic CVA 12/2991;  c. 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 near-syncopal episode in November 2008;  b. 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   Tobacco abuse  Vertigo      ALLERGIES:  Allergies  Allergen Reactions   Banana Swelling and Other (See Comments)    Facial swelling   Penicillins Itching, Swelling and Rash    Face swells and break out   Strawberry Extract Swelling and Other (See Comments)    Facial swelling      I spent  45 minutes providing this consultation; this includes time spent with patient/family, chart review and documentation. More than 50% of the time in this consultation was spent on counseling and coordinating communication   Thank you for the opportunity to participate in the care of [name] Please call our office at [phone] if we can be of additional assistance.  Note: Portions of this note were generated with [employer]. Dictation errors may occur despite best attempts at proofreading.  Teodoro Spray, NP

## 2021-12-16 ENCOUNTER — Other Ambulatory Visit: Payer: Self-pay | Admitting: *Deleted

## 2021-12-16 DIAGNOSIS — D649 Anemia, unspecified: Secondary | ICD-10-CM

## 2021-12-18 ENCOUNTER — Other Ambulatory Visit: Payer: Self-pay

## 2021-12-18 ENCOUNTER — Inpatient Hospital Stay: Payer: Commercial Managed Care - HMO | Attending: Hematology

## 2021-12-18 ENCOUNTER — Inpatient Hospital Stay (HOSPITAL_BASED_OUTPATIENT_CLINIC_OR_DEPARTMENT_OTHER): Payer: Commercial Managed Care - HMO | Admitting: Hematology

## 2021-12-18 VITALS — BP 125/50 | HR 82 | Temp 97.0°F | Resp 18

## 2021-12-18 DIAGNOSIS — Z8379 Family history of other diseases of the digestive system: Secondary | ICD-10-CM | POA: Insufficient documentation

## 2021-12-18 DIAGNOSIS — Z89511 Acquired absence of right leg below knee: Secondary | ICD-10-CM | POA: Insufficient documentation

## 2021-12-18 DIAGNOSIS — Z836 Family history of other diseases of the respiratory system: Secondary | ICD-10-CM | POA: Insufficient documentation

## 2021-12-18 DIAGNOSIS — Z89512 Acquired absence of left leg below knee: Secondary | ICD-10-CM | POA: Insufficient documentation

## 2021-12-18 DIAGNOSIS — Z8249 Family history of ischemic heart disease and other diseases of the circulatory system: Secondary | ICD-10-CM | POA: Diagnosis not present

## 2021-12-18 DIAGNOSIS — Z87891 Personal history of nicotine dependence: Secondary | ICD-10-CM | POA: Insufficient documentation

## 2021-12-18 DIAGNOSIS — Z9049 Acquired absence of other specified parts of digestive tract: Secondary | ICD-10-CM | POA: Insufficient documentation

## 2021-12-18 DIAGNOSIS — Z8673 Personal history of transient ischemic attack (TIA), and cerebral infarction without residual deficits: Secondary | ICD-10-CM | POA: Insufficient documentation

## 2021-12-18 DIAGNOSIS — E119 Type 2 diabetes mellitus without complications: Secondary | ICD-10-CM | POA: Insufficient documentation

## 2021-12-18 DIAGNOSIS — Z833 Family history of diabetes mellitus: Secondary | ICD-10-CM | POA: Diagnosis not present

## 2021-12-18 DIAGNOSIS — N189 Chronic kidney disease, unspecified: Secondary | ICD-10-CM | POA: Insufficient documentation

## 2021-12-18 DIAGNOSIS — Z880 Allergy status to penicillin: Secondary | ICD-10-CM | POA: Diagnosis not present

## 2021-12-18 DIAGNOSIS — Z8719 Personal history of other diseases of the digestive system: Secondary | ICD-10-CM | POA: Insufficient documentation

## 2021-12-18 DIAGNOSIS — Z7289 Other problems related to lifestyle: Secondary | ICD-10-CM | POA: Diagnosis not present

## 2021-12-18 DIAGNOSIS — D649 Anemia, unspecified: Secondary | ICD-10-CM | POA: Diagnosis not present

## 2021-12-18 DIAGNOSIS — I129 Hypertensive chronic kidney disease with stage 1 through stage 4 chronic kidney disease, or unspecified chronic kidney disease: Secondary | ICD-10-CM | POA: Insufficient documentation

## 2021-12-18 DIAGNOSIS — Z79899 Other long term (current) drug therapy: Secondary | ICD-10-CM | POA: Insufficient documentation

## 2021-12-18 LAB — CBC WITH DIFFERENTIAL (CANCER CENTER ONLY)
Abs Immature Granulocytes: 0.08 10*3/uL — ABNORMAL HIGH (ref 0.00–0.07)
Basophils Absolute: 0.1 10*3/uL (ref 0.0–0.1)
Basophils Relative: 0 %
Eosinophils Absolute: 0.2 10*3/uL (ref 0.0–0.5)
Eosinophils Relative: 2 %
HCT: 31.5 % — ABNORMAL LOW (ref 36.0–46.0)
Hemoglobin: 9.6 g/dL — ABNORMAL LOW (ref 12.0–15.0)
Immature Granulocytes: 1 %
Lymphocytes Relative: 23 %
Lymphs Abs: 2.5 10*3/uL (ref 0.7–4.0)
MCH: 25.1 pg — ABNORMAL LOW (ref 26.0–34.0)
MCHC: 30.5 g/dL (ref 30.0–36.0)
MCV: 82.5 fL (ref 80.0–100.0)
Monocytes Absolute: 0.4 10*3/uL (ref 0.1–1.0)
Monocytes Relative: 3 %
Neutro Abs: 8 10*3/uL — ABNORMAL HIGH (ref 1.7–7.7)
Neutrophils Relative %: 71 %
Platelet Count: 316 10*3/uL (ref 150–400)
RBC: 3.82 MIL/uL — ABNORMAL LOW (ref 3.87–5.11)
RDW: 17.5 % — ABNORMAL HIGH (ref 11.5–15.5)
WBC Count: 11.2 10*3/uL — ABNORMAL HIGH (ref 4.0–10.5)
nRBC: 0 % (ref 0.0–0.2)

## 2021-12-18 LAB — CMP (CANCER CENTER ONLY)
ALT: 16 U/L (ref 0–44)
AST: 12 U/L — ABNORMAL LOW (ref 15–41)
Albumin: 2.8 g/dL — ABNORMAL LOW (ref 3.5–5.0)
Alkaline Phosphatase: 124 U/L (ref 38–126)
Anion gap: 8 (ref 5–15)
BUN: 16 mg/dL (ref 8–23)
CO2: 24 mmol/L (ref 22–32)
Calcium: 8.3 mg/dL — ABNORMAL LOW (ref 8.9–10.3)
Chloride: 107 mmol/L (ref 98–111)
Creatinine: 1.11 mg/dL — ABNORMAL HIGH (ref 0.44–1.00)
GFR, Estimated: 54 mL/min — ABNORMAL LOW (ref >60)
Glucose, Bld: 284 mg/dL — ABNORMAL HIGH (ref 70–99)
Potassium: 3.7 mmol/L (ref 3.5–5.1)
Sodium: 139 mmol/L (ref 135–145)
Total Bilirubin: 0.3 mg/dL (ref 0.3–1.2)
Total Protein: 6.7 g/dL (ref 6.5–8.1)

## 2021-12-18 LAB — IRON AND IRON BINDING CAPACITY (CC-WL,HP ONLY)
Iron: 33 ug/dL (ref 28–170)
Saturation Ratios: 14 % (ref 10.4–31.8)
TIBC: 244 ug/dL — ABNORMAL LOW (ref 250–450)
UIBC: 211 ug/dL

## 2021-12-18 LAB — VITAMIN B12: Vitamin B-12: 3977 pg/mL — ABNORMAL HIGH (ref 180–914)

## 2021-12-18 LAB — FERRITIN: Ferritin: 71 ng/mL (ref 11–307)

## 2021-12-18 LAB — LACTATE DEHYDROGENASE: LDH: 102 U/L (ref 98–192)

## 2021-12-25 NOTE — Progress Notes (Signed)
HEMATOLOGY/ONCOLOGY CLINIC NOTE  Date of Service: .12/18/2021   Patient Care Team: Duffy Bruce, Manya Silvas, MD as PCP - General (Internal Medicine) Verl Blalock, Marijo Conception, MD (Inactive) (Cardiology)  CHIEF COMPLAINTS/PURPOSE OF CONSULTATION:  Follow-up for continued evaluation and management of chronic normocytic anemia  HISTORY OF PRESENTING ILLNESS:   [name] is a wonderful 66 y.o. female who has previously been seen by Dr. Walden Field for evaluation and management of normocytic anemia. The pt reports that she is doing well overall.  The pt reports that she had a Colonoscopy and an Upper Endoscopy last week. She denies any major medication changes since her last visit. She has felt the same in the last year and has no new concerns. Pt quit smoking over a year ago and does not drink much alcohol. Pt is currently at [facility], a skilled nursing facility. She has not been vitamin deficient to her knowledge. Pt reports that her diabetes has been well-controlled and denies any kidney dysfunction due to diabetes. She has been anemic for years and does now know the cause. She was not having frequent infections prior to her initial diagnosis of anemia. She believes that her mother had anemia but is unsure of any specific diagnoses. Pt has not had any concerns for thyroid dysfunction or sudden weight changes. Pt has continued eating and drinking well.   Of note prior to the patient's visit today, pt has had Upper Endoscopy completed on 03/29/2019 with results revealing " - Z-line irregular, 37 cm from the incisors. Biopsied. - Normal upper third of esophagus, middle third of esophagus and lower third of esophagus. - Gastritis. Biopsied. - Normal duodenal bulb, first portion of the duodenum and second portion of the duodenum. Biopsied."  Of note since the patient's last visit, pt has had Colonoscopy completed on 03/29/2019 with results revealing "- One 8 mm polyp in the transverse colon, removed with  a cold snare. Resected and retrieved. - Three 2 to 3 mm polyps in the rectum, removed with a cold biopsy forceps. Resected and retrieved. - The distal rectum and anal verge are normal on retroflexion view."  Most recent lab results (06/17/2018) of CBC is as follows: all values are WNL except for WBC at 14.5K, RBC at 3.60, Hgb at 9.2, HCT at 30.6, MCH at 25.6, RDW at 17.6, Neutro Abs at 11.5K, Abs Immature Granulocytes at 0.12K, Glucose at 12, Creatinine at 1.04, Albumin at 3.2, AST at 6, Total Bilirubin at <0.2, GFR Est Non Afr Am at 57.  06/17/2018 Ferritin at 116  On review of systems, pt denies SOB, chest pains, unexpected weight changes, abdominal pain and any other symptoms.   On PMHx the pt reports Diabetes, CKD, HTN, Anemia. On Social Hx the pt reports little EtOH use, previous smoker (quit last year). On Family Hx the pt reports that her mother had anemia.  INTERVAL HISTORY:  [name] is here for continued evaluation and management of her chronic multifactorial normocytic anemia . She notes no acute new symptoms since her last clinic visit.  Reports no significant fatigue.  No overt GI bleeding or other blood loss. No lightheadedness or dizziness. No unexpected sudden weight loss. Labs done today were reviewed in detail with her and her accompanying care provider from her nursing home.  MEDICAL HISTORY:  Past Medical History:  Diagnosis Date   Atypical chest pain    a. non obstructive by cath in 2008 and 2010;  b. 02/2012 Myoview: non-ischemic, EF 57%  Cellulitis    a. left foot-> s/p L BKA   Chronic kidney disease    Constipation    CVA (cerebral infarction)    a.  Small right parietal noted incidentally 04/2007;  b. right sided embolic CVA 11/8240;  c. 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 near-syncopal episode in November 2008;  b. 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   Tobacco abuse    Vertigo     SURGICAL HISTORY: Past Surgical History:  Procedure Laterality Date   AMPUTATION  12/30/2011   Procedure: AMPUTATION RAY;  Surgeon: Wylene Simmer, MD;  Location: [place];  Service: Orthopedics;  Laterality: Left;  LEFT FIRST RAY AMPUTATION   AMPUTATION  01/13/2012   Procedure: AMPUTATION BELOW KNEE;  Surgeon: Wylene Simmer, MD;  Location: [place];  Service: Orthopedics;  Laterality: Left;   AMPUTATION  03/04/2012   Procedure: AMPUTATION BELOW KNEE;  Surgeon: Wylene Simmer, MD;  Location: [place];  Service: Orthopedics;  Laterality: Left;  Revision of Left Below Knee Amputation   AMPUTATION Right 11/02/2013   Procedure: AMPUTATION BELOW KNEE;  Surgeon: Newt Minion, MD;  Location: [place];  Service: Orthopedics;  Laterality: Right;  Right Below Knee Amputation   BIOPSY  03/29/2019   Procedure: BIOPSY;  Surgeon: Lavena Bullion, DO;  Location: [facility];  Service: Gastroenterology;;  EGD and Colon   CARDIAC CATHETERIZATION     LAD 30%, circumflex 50%, OM 75%, RI 60% with small branch 80%, dominant RCA 60%, EF 45-50%   CHOLECYSTECTOMY     COLONOSCOPY     COLONOSCOPY WITH PROPOFOL N/A 03/29/2019   Procedure: COLONOSCOPY WITH PROPOFOL;  Surgeon: Lavena Bullion, DO;  Location: [facility];  Service: Gastroenterology;  Laterality: N/A;   ESOPHAGOGASTRODUODENOSCOPY (EGD) WITH PROPOFOL N/A 03/29/2019   Procedure: ESOPHAGOGASTRODUODENOSCOPY (EGD) WITH PROPOFOL;  Surgeon: Lavena Bullion, DO;  Location: [facility];  Service: Gastroenterology;  Laterality: N/A;   EYE SURGERY Right    cataract   I & D EXTREMITY  12/23/2011   Procedure: IRRIGATION AND DEBRIDEMENT EXTREMITY;  Surgeon: Augustin Schooling, MD;  Location: [facility];  Service: Orthopedics;  Laterality: Left;  Left Foot   I & D EXTREMITY  12/26/2011   Procedure: IRRIGATION AND DEBRIDEMENT EXTREMITY;  Surgeon: Wylene Simmer, MD;  Location: [facility] [place];  Service: Orthopedics;  Laterality: Left;  LEFT FOOT I&D WITH POSSIBLE WOUND VAC APPLICATION, POSSIBLE LEFT FIRST RAY AMPUTATION   I & D EXTREMITY Right 10/29/2013   Procedure: IRRIGATION AND DEBRIDEMENT EXTREMITY;  Surgeon: Johnn Hai, MD;  Location: [facility];  Service: Orthopedics;  Laterality: Right;   Invasive Electrophysiologic Study  5/09   followed by insertion of an implantable loop recorder. S/p removal    Multiple Toe Surgeries  POLYPECTOMY  03/29/2019   Procedure: POLYPECTOMY;  Surgeon: Lavena Bullion, DO;  Location: [facility];  Service: Gastroenterology;;   TEE WITHOUT CARDIOVERSION N/A 07/07/2012   Procedure: TRANSESOPHAGEAL ECHOCARDIOGRAM (TEE);  Surgeon: Lelon Perla, MD;  [facility];  Service: Cardiovascular;  Laterality: N/A;    SOCIAL HISTORY: Social History   Socioeconomic History   Marital status: Single    Spouse name: Not on file   Number of children: Not on file   Years of education: Not on file   Highest education level: Not on file  Occupational History   Occupation: Document processor    Comment: [employer]  Tobacco Use   Smoking status: Former    Packs/day: 0.25    Years: 40.00    Total pack years: 10.00    Types: Cigarettes   Smokeless tobacco: Never   Tobacco comments:    has not smoked in about a year  Vaping Use   Vaping Use: Never used  Substance and Sexual Activity   Alcohol use: Yes    Alcohol/week: 4.0 standard drinks of alcohol    Types: 4 Cans of beer per week    Comment: occ- foot ball season- 4 beers a week during  football season.   Drug use: No   Sexual activity: Not Currently    Birth control/protection: Post-menopausal  Other Topics Concern   Not on file  Social History Narrative   Lives at [facility] since 12/19/14     Does not routinely exercise.     works as Barista.    >25 pack year history.    FULL CODE   Social Determinants of Health   Financial Resource Strain: Not on file  Food Insecurity: Not on file  Transportation Needs: Not on file  Physical Activity: Not on file  Stress: Not on file  Social Connections: Not on file  Intimate Partner Violence: Not on file    FAMILY HISTORY: Family History  Problem Relation Age of Onset   Pneumonia Mother    Gallbladder disease Mother        cancer   Heart failure Mother    Diabetes Father    Coronary artery disease Father    Stroke Neg Hx     ALLERGIES:  is allergic to banana, penicillins, and strawberry extract.  MEDICATIONS:  Current Outpatient Medications  Medication Sig Dispense Refill   albuterol (VENTOLIN HFA) 108 (90 Base) MCG/ACT inhaler Inhale 2 puffs into the lungs every 4 (four) hours as needed for wheezing or shortness of breath.     amLODipine (NORVASC) 2.5 MG tablet Take 2.5 mg by mouth daily.     aspirin EC 81 MG tablet Take 81 mg by mouth daily.     atorvastatin (LIPITOR) 20 MG tablet Take 20 mg by mouth daily.     carvedilol (COREG) 12.5 MG tablet Take 12.5 mg by mouth 2 (two) times daily.      cetirizine (ZYRTEC) 10 MG tablet Take 10 mg by mouth daily.     Cholecalciferol (VITAMIN D) 50 MCG (2000 UT) tablet Take 2,000 Units by mouth daily.     cyanocobalamin 1000 MCG tablet Take 1,000 mcg by mouth daily.     docusate sodium 100 MG CAPS Take 100 mg by mouth 2 (two) times daily. 10 capsule 0   ferrous sulfate 325 (65 FE) MG tablet Take 325 mg by mouth daily with breakfast.     gabapentin (NEURONTIN) 100 MG capsule Take 100 mg by mouth See  admin instructions. Take with 400 mg morning dose to equal 500 mg  in the morning, take with 600 mg night dose to equal 700 mg at night     gabapentin (NEURONTIN) 400 MG capsule Take 400 mg by mouth 2 (two) times daily.      gabapentin (NEURONTIN) 600 MG tablet Take 600 mg by mouth at bedtime.     hydrALAZINE (APRESOLINE) 25 MG tablet Take 75 mg by mouth 3 (three) times daily.     HYDROcodone-acetaminophen (NORCO/VICODIN) 5-325 MG tablet Take 1 tablet by mouth every 8 (eight) hours as needed for moderate pain.      insulin lispro (HUMALOG) 100 UNIT/ML injection Inject 2-14 Units into the skin 4 (four) times daily -  with meals and at bedtime. 201-250 = 2 units, 251-300 = 4 units, 301-350= 6 units, 351-400=8 units, 401-450=10 units, 451-500 =12 units >500 = 14 units     LEVEMIR 100 UNIT/ML injection Inject 53 Units into the skin at bedtime.      lisinopril-hydrochlorothiazide (ZESTORETIC) 20-12.5 MG tablet Take 2 tablets by mouth daily.     PAZEO 0.7 % SOLN Place 1 drop into both eyes daily.      polyethylene glycol (MIRALAX / GLYCOLAX) packet Take 17 g by mouth daily as needed for moderate constipation. (Patient taking differently: Take 17 g by mouth daily. ) 14 each 0   SitaGLIPtin-MetFORMIN HCl (JANUMET XR) 50-1000 MG TB24 Take 1 tablet by mouth daily.     Tetrahydrozoline HCl (VISINE OP) Place 1 drop into both eyes at bedtime.     vitamin C (ASCORBIC ACID) 500 MG tablet Take 500 mg by mouth daily.     Current Facility-Administered Medications  Medication Dose Route Frequency Provider Last Rate Last Admin   aflibercept (EYLEA) SOLN 2 mg  2 mg Intravitreal  Bernarda Caffey, MD   2 mg at 03/19/18 1621   Bevacizumab (AVASTIN) SOLN 1.25 mg  1.25 mg Intravitreal  Bernarda Caffey, MD   1.25 mg at 11/01/17 2300   Bevacizumab (AVASTIN) SOLN 1.25 mg  1.25 mg Intravitreal  Bernarda Caffey, MD   1.25 mg at 12/01/17 1437   Bevacizumab (AVASTIN) SOLN 1.25 mg  1.25 mg Intravitreal  Bernarda Caffey, MD   1.25 mg at 12/31/17 2354   Bevacizumab (AVASTIN) SOLN 1.25 mg  1.25 mg  Intravitreal  Bernarda Caffey, MD   1.25 mg at 01/27/18 1508    REVIEW OF SYSTEMS:    10 Point review of Systems was done is negative except as noted above.  PHYSICAL EXAMINATION: ECOG PERFORMANCE STATUS: 3 - Symptomatic, >50% confined to bed  . Vitals:   12/18/21 1406  BP: (!) 125/50  Pulse: 82  Resp: 18  Temp: (!) 97 F (36.1 C)  SpO2: 100%    There were no vitals filed for this visit.  .There is no height or weight on file to calculate BMI.  N NAD GENERAL:alert, in no acute distress and comfortable SKIN: no acute rashes, no significant lesions EYES: conjunctiva are pink and non-injected, sclera anicteric OROPHARYNX: MMM, no exudates, no oropharyngeal erythema or ulceration NECK: supple, no JVD LYMPH:  no palpable lymphadenopathy in the cervical, axillary or inguinal regions LUNGS: clear to auscultation b/l with normal respiratory effort HEART: regular rate & rhythm ABDOMEN:  normoactive bowel sounds , non tender, not distended. Extremity: no pedal edema PSYCH: alert & oriented x 3 with fluent speech NEURO: no focal motor/sensory deficits  LABORATORY DATA:  I have reviewed the data as listed  .  Latest Ref Rng & Units 12/18/2021    1:59 PM 06/17/2021    2:32 PM 12/14/2020   10:21 AM  CBC  WBC 4.0 - 10.5 K/uL 11.2  11.5  11.7   Hemoglobin 12.0 - 15.0 g/dL 9.6  8.9  9.8   Hematocrit 36.0 - 46.0 % 31.5  29.6  32.6   Platelets 150 - 400 K/uL 316  260  281    . CBC    Component Value Date/Time   WBC 11.2 (H) 12/18/2021 1359   WBC 10.6 (H) 04/05/2019 1057   RBC 3.82 (L) 12/18/2021 1359   HGB 9.6 (L) 12/18/2021 1359   HCT 31.5 (L) 12/18/2021 1359   PLT 316 12/18/2021 1359   MCV 82.5 12/18/2021 1359   MCH 25.1 (L) 12/18/2021 1359   MCHC 30.5 12/18/2021 1359   RDW 17.5 (H) 12/18/2021 1359   LYMPHSABS 2.5 12/18/2021 1359   MONOABS 0.4 12/18/2021 1359   EOSABS 0.2 12/18/2021 1359   BASOSABS 0.1 12/18/2021 1359       Latest Ref Rng & Units 12/18/2021     1:59 PM 06/17/2021    2:32 PM 12/14/2020   10:21 AM  CMP  Glucose 70 - 99 mg/dL 284  208  265   BUN 8 - 23 mg/dL 16  30  35   Creatinine 0.44 - 1.00 mg/dL 1.11  1.16  1.31   Sodium 135 - 145 mmol/L 139  140  144   Potassium 3.5 - 5.1 mmol/L 3.7  4.6  4.7   Chloride 98 - 111 mmol/L 107  107  112   CO2 22 - 32 mmol/L '24  25  25   '$ Calcium 8.9 - 10.3 mg/dL 8.3  8.9  9.3   Total Protein 6.5 - 8.1 g/dL 6.7  6.7  7.2   Total Bilirubin 0.3 - 1.2 mg/dL 0.3  0.3  0.2   Alkaline Phos 38 - 126 U/L 124  112  151   AST 15 - 41 U/L '12  8  7   '$ ALT 0 - 44 U/L '16  10  12    '$ Component     Latest Ref Rng & Units 06/17/2021  IgG (Immunoglobin G), Serum     586 - 1,602 mg/dL 1,033  IgA     87 - 352 mg/dL 106  IgM (Immunoglobulin M), Srm     26 - 217 mg/dL 87  Total Protein ELP     6.0 - 8.5 g/dL 6.2  Albumin SerPl Elph-Mcnc     2.9 - 4.4 g/dL 3.1  Alpha 1     0.0 - 0.4 g/dL 0.2  Alpha2 Glob SerPl Elph-Mcnc     0.4 - 1.0 g/dL 0.9  B-Globulin SerPl Elph-Mcnc     0.7 - 1.3 g/dL 1.0  Gamma Glob SerPl Elph-Mcnc     0.4 - 1.8 g/dL 1.1  M Protein SerPl Elph-Mcnc     Not Observed g/dL Not Observed  Globulin, Total     2.2 - 3.9 g/dL 3.1  Albumin/Glob SerPl     0.7 - 1.7 1.1  IFE 1      Comment  Please Note (HCV):      Comment  Iron     41 - 142 ug/dL 30 (L)  TIBC     236 - 444 ug/dL 258  Saturation Ratios     21 - 57 % 12 (L)  UIBC     120 - 384 ug/dL 228  Kappa free  light chain     3.3 - 19.4 mg/L 68.4 (H)  Lambda free light chains     5.7 - 26.3 mg/L 56.3 (H)  Kappa, lambda light chain ratio     0.26 - 1.65 1.21  Vitamin B12     180 - 914 pg/mL 1,264 (H)  LDH     98 - 192 U/L 89 (L)  Ferritin     11 - 307 ng/mL 126     RADIOGRAPHIC STUDIES: I have personally reviewed the radiological images as listed and agreed with the findings in the report. No results found.  ASSESSMENT & PLAN:   66 yo with   1) Normocytic Anemia-anemia of chronic disease due to poorly controlled  diabetes and some element of chronic kidney disease.  PLAN: Patient's lab results were discussed in detail today. CBC shows improvement of hemoglobin to 9.6 WBC count of 11.2 platelet count of 316k CMP shows chronic kidney disease with a creatinine of 1.1 low albumin of 2.8 uncontrolled blood sugar with a glucose of 284 B12 levels elevated while on replacement Ferritin 71 with an iron saturation of 14%.  Patient will continue p.o. iron polysaccharide at 150 mg p.o. daily to try to target a ferritin of more than 100 and iron saturation more than 20%.  She will continue follow-up with her primary care physician at the nursing home to adjust and optimize her p.o. iron replacement. No indication for erythropoietin at this time.  FOLLOW UP: RTC with Dr Irene Limbo as needed F/u with PCP in SNF  .  The total time spent in the appointment was 21 minutes*.  All of the patient's questions were answered with apparent satisfaction. The patient knows to call the clinic with any problems, questions or concerns.   Sullivan Lone MD MS AAHIVMS [facility] Hematology/Oncology Physician [facility]  .*Total Encounter Time as defined by the Centers for Medicare and Medicaid Services includes, in addition to the face-to-face time of a patient visit (documented in the note above) non-face-to-face time: obtaining and reviewing outside history, ordering and reviewing medications, tests or procedures, care coordination (communications with other health care professionals or caregivers) and documentation in the medical record.

## 2022-04-25 ENCOUNTER — Non-Acute Institutional Stay: Payer: Commercial Managed Care - HMO | Admitting: Hospice

## 2022-04-25 DIAGNOSIS — G546 Phantom limb syndrome with pain: Secondary | ICD-10-CM

## 2022-04-25 DIAGNOSIS — Z515 Encounter for palliative care: Secondary | ICD-10-CM

## 2022-04-25 DIAGNOSIS — D649 Anemia, unspecified: Secondary | ICD-10-CM

## 2022-04-25 DIAGNOSIS — Z794 Long term (current) use of insulin: Secondary | ICD-10-CM

## 2022-04-25 NOTE — Progress Notes (Signed)
[facility] Consult Note Telephone: [phone]  Fax: [phone]  PATIENT NAME: [name] DOB: 01/05/1956 MRN: [id number]  PRIMARY CARE PROVIDER:  Dr. Jules Husbands REFERRING PROVIDER: Dr. Jules Husbands RESPONSIBLE PARTY:   Self Contact_ Niece Anna Genre [address]     Name Relation Home Work Mobile   [place] Niece [phone]    Edwinna Areola [phone]     Cashae, Weich [phone]         Visit is to build trust and highlight Palliative Medicine as specialized medical care for people living with serious illness, aimed at facilitating better quality of life through symptoms relief, assisting with advance care planning and complex medical decision making. This is a follow up visit.  RECOMMENDATIONS/PLAN:   Advance Care Planning/Code Status: Patient is an Full code  Goals of Care: Goals of care include to maximize quality of life and symptom management.  Visit consisted of counseling and education dealing with the complex and emotionally intense issues of symptom management and palliative care in the setting of serious and potentially life-threatening illness. Palliative care team will continue to support patient, patient's family, and medical team.  Symptom management/Plan:  Normocytic anemia: chronic,  current hgb  9.6 12/18/21, 9.3 07/19/21. Continue ferrous sulfate 325 mg daily with vitamin C 500 mg daily.  Routine CBC BMP.  Patient is followed by Oncology. Continue follow up appointments as planned.  Type 2 diabetes mellitus: Current A1c 10.1 04/18/22 up from 7.23 November 2021. Dose adjustment; Continue insulin, metformin and dulaglutide as currently ordered.  Nursing reports patient eating more of fast foods. Education provide on the need to cut back. Continue current diabetic diet, no concentrated sweets.  ACHS. Monitor for hypoglycemic/hyperglycemic events and  notify provider accordingly. Repeat A1c in 3 months Phantom limb pain: Stable. Continue Gabapentin Follow up: Palliative care will continue to follow for complex medical decision making, advance care planning, and clarification of goals. Return 6 weeks or prn. Encouraged to call provider sooner with any concerns.  CHIEF COMPLAINT: Palliative follow up  HISTORY OF PRESENT ILLNESS: [name] is a 66 y.o. female with multiple medical problems including chronic normocytic anemia, HTN, type 2 diabetes mellitus, chronic, insulin-dependent with renal complication and retinopathy.  History of BKA, CKD 3, HLD,  CVA. History obtained from review of EMR, discussion with primary team, family and/or patient. Records reviewed and summarized above. All 10 point systems reviewed and are negative.  Review and summarization of Epic records shows history from other than patient. Rest of 10 point ROS asked and negative.  I reviewed, as needed, available labs, patient records, imaging, studies and related documents from the EMR.  Palliative Care was asked to follow this patient of help address complex decision making in the context of advance care planning and goals of care clarification.   PERTINENT MEDICATIONS:  Outpatient Encounter Medications as of 04/25/2022  Medication Sig   albuterol (VENTOLIN HFA) 108 (90 Base) MCG/ACT inhaler Inhale 2 puffs into the lungs every 4 (four) hours as needed for wheezing or shortness of breath.   amLODipine (NORVASC) 2.5 MG tablet Take 2.5 mg by mouth daily.   aspirin EC 81 MG tablet Take 81 mg by mouth daily.   atorvastatin (LIPITOR) 20 MG tablet Take 20 mg by mouth daily.   carvedilol (COREG) 12.5 MG tablet Take 12.5 mg by mouth 2 (two) times daily.    cetirizine (ZYRTEC) 10 MG tablet Take 10  mg by mouth daily.   Cholecalciferol (VITAMIN D) 50 MCG (2000 UT) tablet Take 2,000 Units by mouth daily.   cyanocobalamin 1000 MCG tablet Take 1,000 mcg by mouth daily.   docusate  sodium 100 MG CAPS Take 100 mg by mouth 2 (two) times daily.   ferrous sulfate 325 (65 FE) MG tablet Take 325 mg by mouth daily with breakfast.   gabapentin (NEURONTIN) 100 MG capsule Take 100 mg by mouth See admin instructions. Take with 400 mg morning dose to equal 500 mg in the morning, take with 600 mg night dose to equal 700 mg at night   gabapentin (NEURONTIN) 400 MG capsule Take 400 mg by mouth 2 (two) times daily.    gabapentin (NEURONTIN) 600 MG tablet Take 600 mg by mouth at bedtime.   hydrALAZINE (APRESOLINE) 25 MG tablet Take 75 mg by mouth 3 (three) times daily.   HYDROcodone-acetaminophen (NORCO/VICODIN) 5-325 MG tablet Take 1 tablet by mouth every 8 (eight) hours as needed for moderate pain.    insulin lispro (HUMALOG) 100 UNIT/ML injection Inject 2-14 Units into the skin 4 (four) times daily -  with meals and at bedtime. 201-250 = 2 units, 251-300 = 4 units, 301-350= 6 units, 351-400=8 units, 401-450=10 units, 451-500 =12 units >500 = 14 units   LEVEMIR 100 UNIT/ML injection Inject 53 Units into the skin at bedtime.    lisinopril-hydrochlorothiazide (ZESTORETIC) 20-12.5 MG tablet Take 2 tablets by mouth daily.   PAZEO 0.7 % SOLN Place 1 drop into both eyes daily.    polyethylene glycol (MIRALAX / GLYCOLAX) packet Take 17 g by mouth daily as needed for moderate constipation. (Patient taking differently: Take 17 g by mouth daily. )   SitaGLIPtin-MetFORMIN HCl (JANUMET XR) 50-1000 MG TB24 Take 1 tablet by mouth daily.   Tetrahydrozoline HCl (VISINE OP) Place 1 drop into both eyes at bedtime.   vitamin C (ASCORBIC ACID) 500 MG tablet Take 500 mg by mouth daily.   Facility-Administered Encounter Medications as of 04/25/2022  Medication   aflibercept (EYLEA) SOLN 2 mg   Bevacizumab (AVASTIN) SOLN 1.25 mg   Bevacizumab (AVASTIN) SOLN 1.25 mg   Bevacizumab (AVASTIN) SOLN 1.25 mg   Bevacizumab (AVASTIN) SOLN 1.25 mg    HOSPICE ELIGIBILITY/DIAGNOSIS: TBD  PAST MEDICAL HISTORY:  Past  Medical History:  Diagnosis Date   Atypical chest pain    a. non obstructive by cath in 2008 and 2010;  b. 02/2012 Myoview: non-ischemic, EF 57%   Cellulitis    a. left foot-> s/p L BKA   Chronic kidney disease    Constipation    CVA (cerebral infarction)    a.  Small right parietal noted incidentally 04/2007;  b. right sided embolic CVA 11/5447;  c. 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 near-syncopal episode in November 2008;  b. 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   Tobacco abuse    Vertigo      ALLERGIES:  Allergies  Allergen Reactions   Banana Swelling and Other (See Comments)    Facial swelling   Penicillins Itching, Swelling and Rash    Face swells and break out   Strawberry Extract Swelling and Other (See Comments)    Facial swelling      I spent  45 minutes providing this consultation; this includes time spent with patient/family, chart review and documentation. More than 50% of the time in this consultation was spent on counseling and coordinating communication   Thank you for the opportunity to participate in the care of [name] Please call our office at [phone] if we can be of additional assistance.  Note: Portions of this note were generated with Lobbyist. Dictation errors may occur despite best attempts at proofreading.  Teodoro Spray, NP

## 2022-05-27 ENCOUNTER — Non-Acute Institutional Stay: Payer: Commercial Managed Care - HMO | Admitting: Hospice

## 2022-05-27 DIAGNOSIS — D649 Anemia, unspecified: Secondary | ICD-10-CM

## 2022-05-27 DIAGNOSIS — G546 Phantom limb syndrome with pain: Secondary | ICD-10-CM

## 2022-05-27 DIAGNOSIS — E1165 Type 2 diabetes mellitus with hyperglycemia: Secondary | ICD-10-CM

## 2022-05-27 DIAGNOSIS — Z515 Encounter for palliative care: Secondary | ICD-10-CM

## 2022-05-27 NOTE — Progress Notes (Signed)
[facility] Consult Note Telephone: [phone]  Fax: [phone]  PATIENT NAME: [name] DOB: 12-16-1955 MRN: [id number]  PRIMARY CARE PROVIDER:  Dr. Jules Husbands REFERRING PROVIDER: Dr. Jules Husbands RESPONSIBLE PARTY:   Self Contact_ Niece Anna Genre [address]     Name Relation Home Work Mobile   [place] Niece [phone]    Edwinna Areola [phone]     Mariaelena, Cade [phone]         Visit is to build trust and highlight Palliative Medicine as specialized medical care for people living with serious illness, aimed at facilitating better quality of life through symptoms relief, assisting with advance care planning and complex medical decision making. This is a follow up visit.  RECOMMENDATIONS/PLAN:   Advance Care Planning/Code Status: Patient is an Full code  Goals of Care: Goals of care include to maximize quality of life and symptom management.  Visit consisted of counseling and education dealing with the complex and emotionally intense issues of symptom management and palliative care in the setting of serious and potentially life-threatening illness. Palliative care team will continue to support patient, patient's family, and medical team.  Symptom management/Plan:  Type 2 diabetes mellitus: Current A1c 10.1 04/18/22 up from 7.23 November 2021.  Continue dulaglutide 4.5 mg Q week, Increase Humalog sliding scale and, Detemir to 36u BID . Educated about outside food and decreasing carb intake to control BS. Monitor for hypo/hyperglycemia and adjust POC accordingly. Dietary consult.  Continue current diabetic diet, no concentrated sweets.  ACHS. Monitor for hypoglycemic/hyperglycemic events and notify provider accordingly. Repeat A1c in 3 months.  Normocytic anemia: chronic,  current hgb  9.6 12/18/21, 9.3 07/19/21. Continue ferrous sulfate 325 mg daily with  vitamin C 500 mg daily.  CBC BMP.   Patient is followed by Oncology. Continue follow up appointments as planned.  Phantom limb pain: Stable. Continue Gabapentin Follow up: Palliative care will continue to follow for complex medical decision making, advance care planning, and clarification of goals. Return 6 weeks or prn. Encouraged to call provider sooner with any concerns.  CHIEF COMPLAINT: Palliative follow up  HISTORY OF PRESENT ILLNESS: [name] is a 67 y.o. female with multiple medical problems including chronic normocytic anemia, HTN, type 2 diabetes mellitus, chronic, insulin-dependent with renal complication and retinopathy.  History of BKA, CKD 3, HLD,  CVA. History obtained from review of EMR, discussion with primary team, family and/or patient. Records reviewed and summarized above. All 10 point systems reviewed and are negative.  Review and summarization of Epic records shows history from other than patient. Rest of 10 point ROS asked and negative.  I reviewed, as needed, available labs, patient records, imaging, studies and related documents from the EMR.  Palliative Care was asked to follow this patient of help address complex decision making in the context of advance care planning and goals of care clarification.   PERTINENT MEDICATIONS:  Outpatient Encounter Medications as of 05/27/2022  Medication Sig   albuterol (VENTOLIN HFA) 108 (90 Base) MCG/ACT inhaler Inhale 2 puffs into the lungs every 4 (four) hours as needed for wheezing or shortness of breath.   amLODipine (NORVASC) 2.5 MG tablet Take 2.5 mg by mouth daily.   aspirin EC 81 MG tablet Take 81 mg by mouth daily.   atorvastatin (LIPITOR) 20 MG tablet Take 20 mg by mouth daily.   carvedilol (COREG) 12.5 MG tablet Take 12.5 mg by mouth 2 (two)  times daily.    cetirizine (ZYRTEC) 10 MG tablet Take 10 mg by mouth daily.   Cholecalciferol (VITAMIN D) 50 MCG (2000 UT) tablet Take 2,000 Units by mouth daily.    cyanocobalamin 1000 MCG tablet Take 1,000 mcg by mouth daily.   docusate sodium 100 MG CAPS Take 100 mg by mouth 2 (two) times daily.   ferrous sulfate 325 (65 FE) MG tablet Take 325 mg by mouth daily with breakfast.   gabapentin (NEURONTIN) 100 MG capsule Take 100 mg by mouth See admin instructions. Take with 400 mg morning dose to equal 500 mg in the morning, take with 600 mg night dose to equal 700 mg at night   gabapentin (NEURONTIN) 400 MG capsule Take 400 mg by mouth 2 (two) times daily.    gabapentin (NEURONTIN) 600 MG tablet Take 600 mg by mouth at bedtime.   hydrALAZINE (APRESOLINE) 25 MG tablet Take 75 mg by mouth 3 (three) times daily.   HYDROcodone-acetaminophen (NORCO/VICODIN) 5-325 MG tablet Take 1 tablet by mouth every 8 (eight) hours as needed for moderate pain.    insulin lispro (HUMALOG) 100 UNIT/ML injection Inject 2-14 Units into the skin 4 (four) times daily -  with meals and at bedtime. 201-250 = 2 units, 251-300 = 4 units, 301-350= 6 units, 351-400=8 units, 401-450=10 units, 451-500 =12 units >500 = 14 units   LEVEMIR 100 UNIT/ML injection Inject 53 Units into the skin at bedtime.    lisinopril-hydrochlorothiazide (ZESTORETIC) 20-12.5 MG tablet Take 2 tablets by mouth daily.   PAZEO 0.7 % SOLN Place 1 drop into both eyes daily.    polyethylene glycol (MIRALAX / GLYCOLAX) packet Take 17 g by mouth daily as needed for moderate constipation. (Patient taking differently: Take 17 g by mouth daily. )   SitaGLIPtin-MetFORMIN HCl (JANUMET XR) 50-1000 MG TB24 Take 1 tablet by mouth daily.   Tetrahydrozoline HCl (VISINE OP) Place 1 drop into both eyes at bedtime.   vitamin C (ASCORBIC ACID) 500 MG tablet Take 500 mg by mouth daily.   Facility-Administered Encounter Medications as of 05/27/2022  Medication   aflibercept (EYLEA) SOLN 2 mg   Bevacizumab (AVASTIN) SOLN 1.25 mg   Bevacizumab (AVASTIN) SOLN 1.25 mg   Bevacizumab (AVASTIN) SOLN 1.25 mg   Bevacizumab (AVASTIN) SOLN 1.25 mg     HOSPICE ELIGIBILITY/DIAGNOSIS: TBD  PAST MEDICAL HISTORY:  Past Medical History:  Diagnosis Date   Atypical chest pain    a. non obstructive by cath in 2008 and 2010;  b. 02/2012 Myoview: non-ischemic, EF 57%   Cellulitis    a. left foot-> s/p L BKA   Chronic kidney disease    Constipation    CVA (cerebral infarction)    a.  Small right parietal noted incidentally 04/2007;  b. right sided embolic CVA 12/5025;  c. 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 near-syncopal episode in November 2008;  b. 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   Tobacco abuse    Vertigo      ALLERGIES:  Allergies  Allergen Reactions   Banana Swelling and Other (See Comments)    Facial swelling   Penicillins Itching, Swelling and Rash    Face swells and break out   Strawberry Extract Swelling and Other (See Comments)    Facial swelling      I spent  35 minutes providing this consultation; this includes time spent with patient/family, chart review and documentation. More than 50% of the time in this consultation was spent on counseling and coordinating communication   Thank you for the opportunity to participate in the care of [name] Please call our office at [phone] if we can be of additional assistance.  Note: Portions of this note were generated with Lobbyist. Dictation errors may occur despite best attempts at  proofreading.  Teodoro Spray, NP

## 2022-07-01 ENCOUNTER — Non-Acute Institutional Stay: Payer: Commercial Managed Care - HMO | Admitting: Hospice

## 2022-07-01 DIAGNOSIS — D649 Anemia, unspecified: Secondary | ICD-10-CM

## 2022-07-01 DIAGNOSIS — Z515 Encounter for palliative care: Secondary | ICD-10-CM

## 2022-07-01 DIAGNOSIS — G546 Phantom limb syndrome with pain: Secondary | ICD-10-CM

## 2022-07-01 DIAGNOSIS — E1165 Type 2 diabetes mellitus with hyperglycemia: Secondary | ICD-10-CM

## 2022-07-01 NOTE — Progress Notes (Signed)
[facility] Consult Note Telephone: [phone]  Fax: [phone]  PATIENT NAME: [name] DOB: 19-Sep-1955 MRN: [id number]  PRIMARY CARE PROVIDER:  Dr. Jules Husbands REFERRING PROVIDER: Dr. Jules Husbands RESPONSIBLE PARTY:   Self Contact_ Niece Anna Genre [id number]     Name Relation Home Work Mobile   [place] Niece [phone]    Edwinna Areola [phone]     Rozalind, [place] [phone]         Visit is to build trust and highlight Palliative Medicine as specialized medical care for people living with serious illness, aimed at facilitating better quality of life through symptoms relief, assisting with advance care planning and complex medical decision making. This is a follow up visit.  RECOMMENDATIONS/PLAN:   Advance Care Planning/Code Status: Patient is an Full code  Goals of Care: Goals of care include to maximize quality of life and symptom management.  Visit consisted of counseling and education dealing with the complex and emotionally intense issues of symptom management and palliative care in the setting of serious and potentially life-threatening illness. Palliative care team will continue to support patient, patient's family, and medical team.  Symptom management/Plan:  Type 2 diabetes mellitus: Patient reports compliance with decreasing carb intake and reducing eating food from outside.  Current A1c 10.1 04/18/22 up from 7.23 November 2021.  Continue dulaglutide Humalog and detemir.  Repeat A1c in 2 months and every 3 months thereafter. Continue current diabetic diet, no concentrated sweets.  ACHS. Monitor for hypoglycemic/hyperglycemic events and notify provider accordingly.  Normocytic anemia: chronic,  current hgb  9.6 12/18/21, 9.3 07/19/21. Continue ferrous sulfate 325 mg daily with vitamin C 500 mg daily.  CBC BMP.   Patient is followed by Oncology.  Continue follow up appointments as planned.  Phantom limb pain: Stable. Continue Gabapentin Follow up: Palliative care will continue to follow for complex medical decision making, advance care planning, and clarification of goals. Return 6 weeks or prn. Encouraged to call provider sooner with any concerns.  CHIEF COMPLAINT: Palliative follow up  HISTORY OF PRESENT ILLNESS: [name] is a 67 y.o. female with multiple medical problems including chronic normocytic anemia, HTN, type 2 diabetes mellitus, chronic, insulin-dependent with renal complication and retinopathy.  History of BKA, CKD 3, HLD,  CVA. History obtained from review of EMR, discussion with primary team, family and/or patient. Records reviewed and summarized above. All 10 point systems reviewed and are negative.  Review and summarization of Epic records shows history from other than patient. Rest of 10 point ROS asked and negative.  I reviewed, as needed, available labs, patient records, imaging, studies and related documents from the EMR.  PERTINENT MEDICATIONS:  Outpatient Encounter Medications as of 05/27/2022  Medication Sig   albuterol (VENTOLIN HFA) 108 (90 Base) MCG/ACT inhaler Inhale 2 puffs into the lungs every 4 (four) hours as needed for wheezing or shortness of breath.   amLODipine (NORVASC) 2.5 MG tablet Take 2.5 mg by mouth daily.   aspirin EC 81 MG tablet Take 81 mg by mouth daily.   atorvastatin (LIPITOR) 20 MG tablet Take 20 mg by mouth daily.   carvedilol (COREG) 12.5 MG tablet Take 12.5 mg by mouth 2 (two) times daily.    cetirizine (ZYRTEC) 10 MG tablet Take 10 mg by mouth daily.   Cholecalciferol (VITAMIN D) 50 MCG (2000 UT) tablet Take 2,000 Units by mouth daily.   cyanocobalamin 1000 MCG tablet Take 1,000  mcg by mouth daily.   docusate sodium 100 MG CAPS Take 100 mg by mouth 2 (two) times daily.   ferrous sulfate 325 (65 FE) MG tablet Take 325 mg by mouth daily with breakfast.   gabapentin (NEURONTIN)  100 MG capsule Take 100 mg by mouth See admin instructions. Take with 400 mg morning dose to equal 500 mg in the morning, take with 600 mg night dose to equal 700 mg at night   gabapentin (NEURONTIN) 400 MG capsule Take 400 mg by mouth 2 (two) times daily.    gabapentin (NEURONTIN) 600 MG tablet Take 600 mg by mouth at bedtime.   hydrALAZINE (APRESOLINE) 25 MG tablet Take 75 mg by mouth 3 (three) times daily.   HYDROcodone-acetaminophen (NORCO/VICODIN) 5-325 MG tablet Take 1 tablet by mouth every 8 (eight) hours as needed for moderate pain.    insulin lispro (HUMALOG) 100 UNIT/ML injection Inject 2-14 Units into the skin 4 (four) times daily -  with meals and at bedtime. 201-250 = 2 units, 251-300 = 4 units, 301-350= 6 units, 351-400=8 units, 401-450=10 units, 451-500 =12 units >500 = 14 units   LEVEMIR 100 UNIT/ML injection Inject 53 Units into the skin at bedtime.    lisinopril-hydrochlorothiazide (ZESTORETIC) 20-12.5 MG tablet Take 2 tablets by mouth daily.   PAZEO 0.7 % SOLN Place 1 drop into both eyes daily.    polyethylene glycol (MIRALAX / GLYCOLAX) packet Take 17 g by mouth daily as needed for moderate constipation. (Patient taking differently: Take 17 g by mouth daily. )   SitaGLIPtin-MetFORMIN HCl (JANUMET XR) 50-1000 MG TB24 Take 1 tablet by mouth daily.   Tetrahydrozoline HCl (VISINE OP) Place 1 drop into both eyes at bedtime.   vitamin C (ASCORBIC ACID) 500 MG tablet Take 500 mg by mouth daily.   Facility-Administered Encounter Medications as of 05/27/2022  Medication   aflibercept (EYLEA) SOLN 2 mg   Bevacizumab (AVASTIN) SOLN 1.25 mg   Bevacizumab (AVASTIN) SOLN 1.25 mg   Bevacizumab (AVASTIN) SOLN 1.25 mg   Bevacizumab (AVASTIN) SOLN 1.25 mg    HOSPICE ELIGIBILITY/DIAGNOSIS: TBD  PAST MEDICAL HISTORY:  Past Medical History:  Diagnosis Date   Atypical chest pain    a. non obstructive by cath in 2008 and 2010;  b. 02/2012 Myoview: non-ischemic, EF 57%   Cellulitis    a.  left foot-> s/p L BKA   Chronic kidney disease    Constipation    CVA (cerebral infarction)    a.  Small right parietal noted incidentally 04/2007;  b. right sided embolic CVA [id number];  c. 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 near-syncopal episode in November 2008;  b. 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   Tobacco abuse    Vertigo      ALLERGIES:  Allergies  Allergen Reactions   Banana Swelling and Other (See Comments)    Facial swelling   Penicillins Itching, Swelling and Rash    Face swells and break out   Strawberry Extract Swelling and Other (See Comments)    Facial swelling      I spent  35 minutes providing this consultation; this includes time spent with patient/family, chart review and documentation. More than 50% of the time in this consultation was spent on counseling and coordinating communication   Thank you for the opportunity to participate in the care of HONORA GWILT Please call our office at [phone] if we can be of additional assistance.  Note: Portions of this note were generated with Lobbyist. Dictation errors may occur despite best attempts at proofreading.  Teodoro Spray, NP

## 2022-07-30 ENCOUNTER — Non-Acute Institutional Stay: Payer: Medicare Other | Admitting: Hospice

## 2022-07-30 DIAGNOSIS — G546 Phantom limb syndrome with pain: Secondary | ICD-10-CM

## 2022-07-30 DIAGNOSIS — D649 Anemia, unspecified: Secondary | ICD-10-CM

## 2022-07-30 DIAGNOSIS — Z515 Encounter for palliative care: Secondary | ICD-10-CM

## 2022-07-30 DIAGNOSIS — Z794 Long term (current) use of insulin: Secondary | ICD-10-CM

## 2022-07-30 NOTE — Progress Notes (Signed)
[place] Consult Note Telephone: [phone]  Fax: [phone]  PATIENT NAME: [name] DOB: Nov 23, 1955 MRN: [id number]  PRIMARY CARE PROVIDER:  Dr. Jules Husbands REFERRING PROVIDER: Dr. Jules Husbands RESPONSIBLE PARTY:   Self Contact_ Niece Anna Genre [id number] [place]     Name Relation Home Work Mobile   [place] Niece [phone]    Edwinna Areola [phone]     Shawntay, Ketcham [phone]         Visit is to build trust and highlight Palliative Medicine as specialized medical care for people living with serious illness, aimed at facilitating better quality of life through symptoms relief, assisting with advance care planning and complex medical decision making. This is a follow up visit.  Visit consisted of counseling and education dealing with the complex and emotionally intense issues of symptom management and palliative care in the setting of serious and potentially life-threatening illness. Palliative care team will continue to support patient, patient's family, and medical team.  RECOMMENDATIONS/PLAN:   Advance Care Planning/Code Status: Patient is an Full code  Goals of Care: Goals of care include to maximize quality of life and symptom management.  Symptom management/Plan:  Type 2 diabetes mellitus: Education on continuing compliance with decreasing carb intake and reducing eating food from outside.  Current A1c 8.7. Continue Meformin, Dulaglutide,  Humalog and detemir.  Repeat A1c ievery 3 months. Continue current diabetic diet, no concentrated sweets.  ACHS.   Normocytic anemia: chronic,  current hgb  at her baseline 9.5 07/02/22, 9.6 12/18/21, 9.3 07/19/21. Continue ferrous sulfate 325 mg daily with vitamin C 500 mg daily.  CBC BMP.  Patient is followed by Oncology. Continue follow up appointments as planned.  Phantom limb pain: Stable. Continue  Gabapentin.  Follow up: Palliative care will continue to follow for complex medical decision making, advance care planning, and clarification of goals. Return 6 weeks or prn. Encouraged to call provider sooner with any concerns.  CHIEF COMPLAINT: Palliative follow up  HISTORY OF PRESENT ILLNESS: [name] is a 67 y.o. female with multiple medical problems including chronic normocytic anemia, HTN, type 2 diabetes mellitus, chronic, insulin-dependent with renal complication and retinopathy.  History of BKA, CKD 3, HLD,  CVA. Patient in no distress, denies pain/discomfort. History obtained from review of EMR, discussion with primary team, family and/or patient. Records reviewed and summarized above. All 10 point systems reviewed and are negative.  Review and summarization of Epic records shows history from other than patient. Rest of 10 point ROS asked and negative.  I reviewed, as needed, available labs, patient records, imaging, studies and related documents from the EMR.  PERTINENT MEDICATIONS:  Outpatient Encounter Medications as of 07/30/2022  Medication Sig   albuterol (VENTOLIN HFA) 108 (90 Base) MCG/ACT inhaler Inhale 2 puffs into the lungs every 4 (four) hours as needed for wheezing or shortness of breath.   amLODipine (NORVASC) 2.5 MG tablet Take 2.5 mg by mouth daily.   aspirin EC 81 MG tablet Take 81 mg by mouth daily.   atorvastatin (LIPITOR) 20 MG tablet Take 20 mg by mouth daily.   carvedilol (COREG) 12.5 MG tablet Take 12.5 mg by mouth 2 (two) times daily.    cetirizine (ZYRTEC) 10 MG tablet Take 10 mg by mouth daily.   Cholecalciferol (VITAMIN D) 50 MCG (2000 UT) tablet Take 2,000 Units by mouth daily.   cyanocobalamin 1000 MCG tablet Take 1,000 mcg by mouth daily.  docusate sodium 100 MG CAPS Take 100 mg by mouth 2 (two) times daily.   ferrous sulfate 325 (65 FE) MG tablet Take 325 mg by mouth daily with breakfast.   gabapentin (NEURONTIN) 100 MG capsule Take 100 mg by  mouth See admin instructions. Take with 400 mg morning dose to equal 500 mg in the morning, take with 600 mg night dose to equal 700 mg at night   gabapentin (NEURONTIN) 400 MG capsule Take 400 mg by mouth 2 (two) times daily.    gabapentin (NEURONTIN) 600 MG tablet Take 600 mg by mouth at bedtime.   hydrALAZINE (APRESOLINE) 25 MG tablet Take 75 mg by mouth 3 (three) times daily.   HYDROcodone-acetaminophen (NORCO/VICODIN) 5-325 MG tablet Take 1 tablet by mouth every 8 (eight) hours as needed for moderate pain.    insulin lispro (HUMALOG) 100 UNIT/ML injection Inject 2-14 Units into the skin 4 (four) times daily -  with meals and at bedtime. 201-250 = 2 units, 251-300 = 4 units, 301-350= 6 units, 351-400=8 units, 401-450=10 units, 451-500 =12 units >500 = 14 units   LEVEMIR 100 UNIT/ML injection Inject 53 Units into the skin at bedtime.    lisinopril-hydrochlorothiazide (ZESTORETIC) 20-12.5 MG tablet Take 2 tablets by mouth daily.   PAZEO 0.7 % SOLN Place 1 drop into both eyes daily.    polyethylene glycol (MIRALAX / GLYCOLAX) packet Take 17 g by mouth daily as needed for moderate constipation. (Patient taking differently: Take 17 g by mouth daily. )   SitaGLIPtin-MetFORMIN HCl (JANUMET XR) 50-1000 MG TB24 Take 1 tablet by mouth daily.   Tetrahydrozoline HCl (VISINE OP) Place 1 drop into both eyes at bedtime.   vitamin C (ASCORBIC ACID) 500 MG tablet Take 500 mg by mouth daily.   Facility-Administered Encounter Medications as of 07/30/2022  Medication   aflibercept (EYLEA) SOLN 2 mg   Bevacizumab (AVASTIN) SOLN 1.25 mg   Bevacizumab (AVASTIN) SOLN 1.25 mg   Bevacizumab (AVASTIN) SOLN 1.25 mg   Bevacizumab (AVASTIN) SOLN 1.25 mg    HOSPICE ELIGIBILITY/DIAGNOSIS: TBD  PAST MEDICAL HISTORY:  Past Medical History:  Diagnosis Date   Atypical chest pain    a. non obstructive by cath in 2008 and 2010;  b. 02/2012 Myoview: non-ischemic, EF 57%   Cellulitis    a. left foot-> s/p L BKA   Chronic  kidney disease    Constipation    CVA (cerebral infarction)    a.  Small right parietal noted incidentally 04/2007;  b. right sided embolic CVA 123456;  c. 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 near-syncopal episode in November 2008;  b. 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   Tobacco abuse    Vertigo      ALLERGIES:  Allergies  Allergen Reactions  Banana Swelling and Other (See Comments)    Facial swelling   Penicillins Itching, Swelling and Rash    Face swells and break out   Strawberry Extract Swelling and Other (See Comments)    Facial swelling      I spent  35 minutes providing this consultation; this includes time spent with patient/family, chart review and documentation. More than 50% of the time in this consultation was spent on counseling and coordinating communication   Thank you for the opportunity to participate in the care of [name] Please call our office at [phone] if we can be of additional assistance.  Note: Portions of this note were generated with Lobbyist. Dictation errors may occur despite best attempts at proofreading.  Teodoro Spray, NP

## 2022-08-27 ENCOUNTER — Non-Acute Institutional Stay: Payer: Medicare Other | Admitting: Hospice

## 2022-08-27 DIAGNOSIS — G546 Phantom limb syndrome with pain: Secondary | ICD-10-CM

## 2022-08-27 DIAGNOSIS — D649 Anemia, unspecified: Secondary | ICD-10-CM

## 2022-08-27 DIAGNOSIS — Z515 Encounter for palliative care: Secondary | ICD-10-CM

## 2022-08-27 DIAGNOSIS — Z794 Long term (current) use of insulin: Secondary | ICD-10-CM

## 2022-08-27 NOTE — Progress Notes (Signed)
[facility] Consult Note Telephone: [phone]  Fax: [phone]  PATIENT NAME: [name] GuernseyCynthia L Renault DOB: 06/16/1955 MRN: [id number]  PRIMARY CARE PROVIDER:  Dr. Blenda MountsKhashana Blake REFERRING PROVIDER: Dr. Blenda MountsKhashana Blake RESPONSIBLE PARTY:   Self Contact_ Niece Shellia Cleverly- Tameka Williams [phone]  Contact Information     Name Relation Home Work Mobile   [place] Niece [phone]    Irma NewnessMorgan,Wanda Sister [phone]     Volney PresserWatson,Lelia Aunt [phone]         Visit is to build trust and highlight Palliative Medicine as specialized medical care for people living with serious illness, aimed at facilitating better quality of life through symptoms relief, assisting with advance care planning and complex medical decision making. This is a follow up visit.  Visit consisted of counseling and education dealing with the complex and emotionally intense issues of symptom management and palliative care in the setting of serious and potentially life-threatening illness. Palliative care team will continue to support patient, patient's family, and medical team.  RECOMMENDATIONS/PLAN:   Advance Care Planning/Code Status: Patient is an Full code  Goals of Care: Goals of care include to maximize quality of life and symptom management.  Symptom management/Plan:  Type 2 diabetes mellitus: Continue to monitor blood sugar level ACHS.  Current A1c 10.1 04/19/23. Marland Kitchen. Continue Meformin, Dulaglutide,  Humalog and detemir.  Repeat A1c. Continue current diabetic diet, no concentrated sweets.  Normocytic anemia: chronic. Monitor for shortness of breath, dizziness, palpitations, weakness. Current hgb  at her baseline 9.5 07/02/22, 9.6 12/18/21, 9.3 07/19/21. Continue ferrous sulfate 325 mg daily with vitamin C 500 mg daily.  Repeat CBC BMP.   Patient is followed by Oncology. Continue follow up appointments as planned.  Phantom limb pain: Stable. Continue  Gabapentin. Out of bed daily.   Follow up: Palliative care will continue to follow for complex medical decision making, advance care planning, and clarification of goals. Return 6 weeks or prn. Encouraged to call provider sooner with any concerns.  CHIEF COMPLAINT: Palliative follow up  HISTORY OF PRESENT ILLNESS: [name] is a 67 y.o. female with multiple medical problems including chronic normocytic anemia, HTN, type 2 diabetes mellitus, chronic, insulin-dependent with renal complication and retinopathy.  History of BKA, CKD 3, HLD,  CVA. Patient in no distress, denies pain/discomfort, no hospitalization since last visit.  History obtained from review of EMR, discussion with primary team, family and/or patient. Records reviewed and summarized above. All 10 point systems reviewed and are negative.  Review and summarization of Epic records shows history from other than patient. Rest of 10 point ROS asked and negative.  I reviewed, as needed, available labs, patient records, imaging, studies and related documents from the EMR.  PERTINENT MEDICATIONS:  Outpatient Encounter Medications as of 08/27/2022  Medication Sig   albuterol (VENTOLIN HFA) 108 (90 Base) MCG/ACT inhaler Inhale 2 puffs into the lungs every 4 (four) hours as needed for wheezing or shortness of breath.   amLODipine (NORVASC) 2.5 MG tablet Take 2.5 mg by mouth daily.   aspirin EC 81 MG tablet Take 81 mg by mouth daily.   atorvastatin (LIPITOR) 20 MG tablet Take 20 mg by mouth daily.   carvedilol (COREG) 12.5 MG tablet Take 12.5 mg by mouth 2 (two) times daily.    cetirizine (ZYRTEC) 10 MG tablet Take 10 mg by mouth daily.   Cholecalciferol (VITAMIN D) 50 MCG (2000 UT) tablet Take 2,000 Units by mouth daily.   cyanocobalamin 1000  MCG tablet Take 1,000 mcg by mouth daily.   docusate sodium 100 MG CAPS Take 100 mg by mouth 2 (two) times daily.   ferrous sulfate 325 (65 FE) MG tablet Take 325 mg by mouth daily with breakfast.    gabapentin (NEURONTIN) 100 MG capsule Take 100 mg by mouth See admin instructions. Take with 400 mg morning dose to equal 500 mg in the morning, take with 600 mg night dose to equal 700 mg at night   gabapentin (NEURONTIN) 400 MG capsule Take 400 mg by mouth 2 (two) times daily.    gabapentin (NEURONTIN) 600 MG tablet Take 600 mg by mouth at bedtime.   hydrALAZINE (APRESOLINE) 25 MG tablet Take 75 mg by mouth 3 (three) times daily.   HYDROcodone-acetaminophen (NORCO/VICODIN) 5-325 MG tablet Take 1 tablet by mouth every 8 (eight) hours as needed for moderate pain.    insulin lispro (HUMALOG) 100 UNIT/ML injection Inject 2-14 Units into the skin 4 (four) times daily -  with meals and at bedtime. 201-250 = 2 units, 251-300 = 4 units, 301-350= 6 units, 351-400=8 units, 401-450=10 units, 451-500 =12 units >500 = 14 units   LEVEMIR 100 UNIT/ML injection Inject 53 Units into the skin at bedtime.    lisinopril-hydrochlorothiazide (ZESTORETIC) 20-12.5 MG tablet Take 2 tablets by mouth daily.   PAZEO 0.7 % SOLN Place 1 drop into both eyes daily.    polyethylene glycol (MIRALAX / GLYCOLAX) packet Take 17 g by mouth daily as needed for moderate constipation. (Patient taking differently: Take 17 g by mouth daily. )   SitaGLIPtin-MetFORMIN HCl (JANUMET XR) 50-1000 MG TB24 Take 1 tablet by mouth daily.   Tetrahydrozoline HCl (VISINE OP) Place 1 drop into both eyes at bedtime.   vitamin C (ASCORBIC ACID) 500 MG tablet Take 500 mg by mouth daily.   Facility-Administered Encounter Medications as of 08/27/2022  Medication   aflibercept (EYLEA) SOLN 2 mg   Bevacizumab (AVASTIN) SOLN 1.25 mg   Bevacizumab (AVASTIN) SOLN 1.25 mg   Bevacizumab (AVASTIN) SOLN 1.25 mg   Bevacizumab (AVASTIN) SOLN 1.25 mg    HOSPICE ELIGIBILITY/DIAGNOSIS: TBD  PAST MEDICAL HISTORY:  Past Medical History:  Diagnosis Date   Atypical chest pain    a. non obstructive by cath in 2008 and 2010;  b. 02/2012 Myoview: non-ischemic, EF  57%   Cellulitis    a. left foot-> s/p L BKA   Chronic kidney disease    Constipation    CVA (cerebral infarction)    a.  Small right parietal noted incidentally 04/2007;  b. right sided embolic CVA 05/2012;  c. 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 near-syncopal episode in November 2008;  b. 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   Tobacco abuse    Vertigo  ALLERGIES:  Allergies  Allergen Reactions   Banana Swelling and Other (See Comments)    Facial swelling   Penicillins Itching, Swelling and Rash    Face swells and break out   Strawberry Extract Swelling and Other (See Comments)    Facial swelling      I spent  35 minutes providing this consultation; this includes time spent with patient/family, chart review and documentation. More than 50% of the time in this consultation was spent on counseling and coordinating communication   Thank you for the opportunity to participate in the care of KATELEN WINCHEL Please call our office at [phone] if we can be of additional assistance.  Note: Portions of this note were generated with Scientist, clinical (histocompatibility and immunogenetics). Dictation errors may occur despite best attempts at proofreading.  Rosaura Carpenter, NP

## 2022-09-19 ENCOUNTER — Non-Acute Institutional Stay: Payer: Medicare Other | Admitting: Hospice

## 2022-09-19 DIAGNOSIS — G546 Phantom limb syndrome with pain: Secondary | ICD-10-CM

## 2022-09-19 DIAGNOSIS — D649 Anemia, unspecified: Secondary | ICD-10-CM

## 2022-09-19 DIAGNOSIS — Z794 Long term (current) use of insulin: Secondary | ICD-10-CM

## 2022-09-19 DIAGNOSIS — Z515 Encounter for palliative care: Secondary | ICD-10-CM

## 2022-09-19 NOTE — Progress Notes (Signed)
Therapist, [facility] Palliative Care Consult Note Telephone: [phone]  Fax: [phone]  PATIENT NAME: [name] DOB: 1956-05-03 MRN: [id number]  PRIMARY CARE PROVIDER:  Dr. Blenda Mounts REFERRING PROVIDER: Dr. Blenda Mounts RESPONSIBLE PARTY:   Self Contact_ Niece Shellia Cleverly [phone]  Contact Information     Name Relation Home Work Mobile   [place] Niece [phone]    Irma Newness [phone]     [place], [place] [phone]         Visit is to build trust and highlight Palliative Medicine as specialized medical care for people living with serious illness, aimed at facilitating better quality of life through symptoms relief, assisting with advance care planning and complex medical decision making. This is a follow up visit.  Visit consisted of counseling and education dealing with the complex and emotionally intense issues of symptom management and palliative care in the setting of serious and potentially life-threatening illness. Palliative care team will continue to support patient, patient's family, and medical team.  RECOMMENDATIONS/PLAN:   Advance Care Planning/Code Status: Patient is an Full code  Goals of Care: Goals of care include to maximize quality of life and symptom management.  Symptom management/Plan:   Type 2 diabetes mellitus: Repeat A1c.  Continue to monitor blood sugar level ACHS.  Current A1c 10.1 04/19/23. Marland Kitchen Continue Meformin, Dulaglutide,  Humalog and detemir. Continue current diabetic diet, no concentrated sweets.  Normocytic anemia: chronic. Monitor for shortness of breath, dizziness, palpitations, weakness/fatigue. Current hgb  at her baseline 9.5 07/02/22, 9.6 12/18/21, 9.3 07/19/21. Continue ferrous sulfate 325 mg daily with vitamin C 500 mg daily.  Repeat CBC BMP.   Patient is followed by Oncology. Continue follow up appointments as planned.  Phantom limb pain: Stable. Continue  Gabapentin. Out of bed daily.   Follow up: Palliative care will continue to follow for complex medical decision making, advance care planning, and clarification of goals. Return 6 weeks or prn. Encouraged to call provider sooner with any concerns.  CHIEF COMPLAINT: Palliative follow up  HISTORY OF PRESENT ILLNESS: [name] is a 67 y.o. female with multiple medical problems including chronic normocytic anemia, HTN, type 2 diabetes mellitus, chronic, insulin-dependent with renal complication and retinopathy.  History of BKA, CKD 3, HLD,  CVA. Patient in no distress, denies pain/discomfort, no hospitalization since last visit.  Nursing with no complaint today.   History obtained from review of EMR, discussion with primary team, family and/or patient. Records reviewed and summarized above. All 10 point systems reviewed and are negative.  Review and summarization of Epic records shows history from other than patient. Rest of 10 point ROS asked and negative.  I reviewed, as needed, available labs, patient records, imaging, studies and related documents from the EMR.  PERTINENT MEDICATIONS:  Outpatient Encounter Medications as of 09/19/2022  Medication Sig   albuterol (VENTOLIN HFA) 108 (90 Base) MCG/ACT inhaler Inhale 2 puffs into the lungs every 4 (four) hours as needed for wheezing or shortness of breath.   amLODipine (NORVASC) 2.5 MG tablet Take 2.5 mg by mouth daily.   aspirin EC 81 MG tablet Take 81 mg by mouth daily.   atorvastatin (LIPITOR) 20 MG tablet Take 20 mg by mouth daily.   carvedilol (COREG) 12.5 MG tablet Take 12.5 mg by mouth 2 (two) times daily.    cetirizine (ZYRTEC) 10 MG tablet Take 10 mg by mouth daily.   Cholecalciferol (VITAMIN D) 50 MCG (2000 UT) tablet Take 2,000  Units by mouth daily.   cyanocobalamin 1000 MCG tablet Take 1,000 mcg by mouth daily.   docusate sodium 100 MG CAPS Take 100 mg by mouth 2 (two) times daily.   ferrous sulfate 325 (65 FE) MG tablet Take 325  mg by mouth daily with breakfast.   gabapentin (NEURONTIN) 100 MG capsule Take 100 mg by mouth See admin instructions. Take with 400 mg morning dose to equal 500 mg in the morning, take with 600 mg night dose to equal 700 mg at night   gabapentin (NEURONTIN) 400 MG capsule Take 400 mg by mouth 2 (two) times daily.    gabapentin (NEURONTIN) 600 MG tablet Take 600 mg by mouth at bedtime.   hydrALAZINE (APRESOLINE) 25 MG tablet Take 75 mg by mouth 3 (three) times daily.   HYDROcodone-acetaminophen (NORCO/VICODIN) 5-325 MG tablet Take 1 tablet by mouth every 8 (eight) hours as needed for moderate pain.    insulin lispro (HUMALOG) 100 UNIT/ML injection Inject 2-14 Units into the skin 4 (four) times daily -  with meals and at bedtime. 201-250 = 2 units, 251-300 = 4 units, 301-350= 6 units, 351-400=8 units, 401-450=10 units, 451-500 =12 units >500 = 14 units   LEVEMIR 100 UNIT/ML injection Inject 53 Units into the skin at bedtime.    lisinopril-hydrochlorothiazide (ZESTORETIC) 20-12.5 MG tablet Take 2 tablets by mouth daily.   PAZEO 0.7 % SOLN Place 1 drop into both eyes daily.    polyethylene glycol (MIRALAX / GLYCOLAX) packet Take 17 g by mouth daily as needed for moderate constipation. (Patient taking differently: Take 17 g by mouth daily. )   SitaGLIPtin-MetFORMIN HCl (JANUMET XR) 50-1000 MG TB24 Take 1 tablet by mouth daily.   Tetrahydrozoline HCl (VISINE OP) Place 1 drop into both eyes at bedtime.   vitamin C (ASCORBIC ACID) 500 MG tablet Take 500 mg by mouth daily.   Facility-Administered Encounter Medications as of 09/19/2022  Medication   aflibercept (EYLEA) SOLN 2 mg   Bevacizumab (AVASTIN) SOLN 1.25 mg   Bevacizumab (AVASTIN) SOLN 1.25 mg   Bevacizumab (AVASTIN) SOLN 1.25 mg   Bevacizumab (AVASTIN) SOLN 1.25 mg    HOSPICE ELIGIBILITY/DIAGNOSIS: TBD  PAST MEDICAL HISTORY:  Past Medical History:  Diagnosis Date   Atypical chest pain    a. non obstructive by cath in 2008 and 2010;  b.  02/2012 Myoview: non-ischemic, EF 57%   Cellulitis    a. left foot-> s/p L BKA   Chronic kidney disease    Constipation    CVA (cerebral infarction)    a.  Small right parietal noted incidentally 04/2007;  b. right sided embolic CVA 05/2012;  c. 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 near-syncopal episode in November 2008;  b. 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  Tobacco abuse    Vertigo      ALLERGIES:  Allergies  Allergen Reactions   Banana Swelling and Other (See Comments)    Facial swelling   Penicillins Itching, Swelling and Rash    Face swells and break out   Strawberry Extract Swelling and Other (See Comments)    Facial swelling      I spent  35 minutes providing this consultation; this includes time spent with patient/family, chart review and documentation. More than 50% of the time in this consultation was spent on counseling and coordinating communication   Thank you for the opportunity to participate in the care of AMERI GARCIARAMIREZ Please call our office at [phone] if we can be of additional assistance.  Note: Portions of this note were generated with Scientist, clinical (histocompatibility and immunogenetics). Dictation errors may occur despite best attempts at proofreading.  Rosaura Carpenter, NP

## 2022-10-29 ENCOUNTER — Non-Acute Institutional Stay: Payer: Medicare Other | Admitting: Hospice

## 2022-10-29 DIAGNOSIS — E1165 Type 2 diabetes mellitus with hyperglycemia: Secondary | ICD-10-CM

## 2022-10-29 DIAGNOSIS — Z515 Encounter for palliative care: Secondary | ICD-10-CM

## 2022-10-29 DIAGNOSIS — D649 Anemia, unspecified: Secondary | ICD-10-CM

## 2022-10-29 DIAGNOSIS — G546 Phantom limb syndrome with pain: Secondary | ICD-10-CM

## 2022-10-29 NOTE — Progress Notes (Signed)
Therapist, nutritional Palliative Care Consult Note Telephone: [phone]  Fax: [phone]  PATIENT NAME: [name] DOB: [birth year] MRN: [id number]  PRIMARY CARE PROVIDER:  Dr. Blenda Mounts REFERRING PROVIDER: Dr. Blenda Mounts RESPONSIBLE PARTY:   Self Contact_ Niece Shellia Cleverly [phone]  Contact Information     Name Relation Home Work Mobile   [place] Niece [phone]    Irma Newness [phone]     Hester, Silberman [phone]         Visit is to build trust and highlight Palliative Medicine as specialized medical care for people living with serious illness, aimed at facilitating better quality of life through symptoms relief, assisting with advance care planning and complex medical decision making. This is a follow up visit.  Visit consisted of counseling and education dealing with the complex and emotionally intense issues of symptom management and palliative care in the setting of serious and potentially life-threatening illness. Palliative care team will continue to support patient, patient's family, and medical team.  RECOMMENDATIONS/PLAN:   Advance Care Planning/Code Status: Patient is an Full code  Goals of Care: Goals of care include to maximize quality of life and symptom management.  Symptom management/Plan:   Type 2 diabetes mellitus: Repeat A1c every 3 months.  Current A1c  is 8.7 07/30/22 A1c 10.1 04/19/23.   Continue to monitor blood sugar level ACHS.  Continue Meformin, Dulaglutide,  Humalog, Glargine and Dulaglutide as ordered.  Continue current diabetic diet, no concentrated sweets.  Patient continues to be compliant and cutting back on ordering food from outside.  Validation provided education reiterated.  Normocytic anemia: chronic. Monitor for shortness of breath, dizziness, palpitations, weakness/fatigue. Current hgb  at her baseline 9.5 07/02/22, 9.6 12/18/21, 9.3 07/19/21. Continue  ferrous sulfate 325 mg daily with vitamin C 500 mg daily.  Repeat CBC BMP.  Patient is followed by Oncology. Continue follow up appointments as planned.   Phantom limb pain: Continue Gabapentin. Out of bed daily.  OT consult as needed.  Follow up: Palliative care will continue to follow for complex medical decision making, advance care planning, and clarification of goals. Return 6 weeks or prn. Encouraged to call provider sooner with any concerns.  CHIEF COMPLAINT: Palliative follow up  HISTORY OF PRESENT ILLNESS: [name] is a 67 y.o. female with multiple medical problems including chronic normocytic anemia, HTN, type 2 diabetes mellitus, chronic, insulin-dependent with renal complication and retinopathy.  History of BKA, CKD 3, HLD,  CVA. Patient in no distress, denies pain/discomfort, no hospitalization since last visit.  Nursing with no complaint today.   History obtained from review of EMR, discussion with primary team, family and/or patient. Records reviewed and summarized above. All 10 point systems reviewed and are negative.  Review and summarization of Epic records shows history from other than patient. Rest of 10 point ROS asked and negative.  I reviewed, as needed, available labs, patient records, imaging, studies and related documents from the EMR.  PERTINENT MEDICATIONS:  Outpatient Encounter Medications as of 10/29/2022  Medication Sig   albuterol (VENTOLIN HFA) 108 (90 Base) MCG/ACT inhaler Inhale 2 puffs into the lungs every 4 (four) hours as needed for wheezing or shortness of breath.   amLODipine (NORVASC) 2.5 MG tablet Take 2.5 mg by mouth daily.   aspirin EC 81 MG tablet Take 81 mg by mouth daily.   atorvastatin (LIPITOR) 20 MG tablet Take 20 mg by mouth daily.   carvedilol (COREG) 12.5 MG tablet  Take 12.5 mg by mouth 2 (two) times daily.    cetirizine (ZYRTEC) 10 MG tablet Take 10 mg by mouth daily.   Cholecalciferol (VITAMIN D) 50 MCG (2000 UT) tablet Take 2,000  Units by mouth daily.   cyanocobalamin 1000 MCG tablet Take 1,000 mcg by mouth daily.   docusate sodium 100 MG CAPS Take 100 mg by mouth 2 (two) times daily.   ferrous sulfate 325 (65 FE) MG tablet Take 325 mg by mouth daily with breakfast.   gabapentin (NEURONTIN) 100 MG capsule Take 100 mg by mouth See admin instructions. Take with 400 mg morning dose to equal 500 mg in the morning, take with 600 mg night dose to equal 700 mg at night   gabapentin (NEURONTIN) 400 MG capsule Take 400 mg by mouth 2 (two) times daily.    gabapentin (NEURONTIN) 600 MG tablet Take 600 mg by mouth at bedtime.   hydrALAZINE (APRESOLINE) 25 MG tablet Take 75 mg by mouth 3 (three) times daily.   HYDROcodone-acetaminophen (NORCO/VICODIN) 5-325 MG tablet Take 1 tablet by mouth every 8 (eight) hours as needed for moderate pain.    insulin lispro (HUMALOG) 100 UNIT/ML injection Inject 2-14 Units into the skin 4 (four) times daily -  with meals and at bedtime. 201-250 = 2 units, 251-300 = 4 units, 301-350= 6 units, 351-400=8 units, 401-450=10 units, 451-500 =12 units >500 = 14 units   LEVEMIR 100 UNIT/ML injection Inject 53 Units into the skin at bedtime.    lisinopril-hydrochlorothiazide (ZESTORETIC) 20-12.5 MG tablet Take 2 tablets by mouth daily.   PAZEO 0.7 % SOLN Place 1 drop into both eyes daily.    polyethylene glycol (MIRALAX / GLYCOLAX) packet Take 17 g by mouth daily as needed for moderate constipation. (Patient taking differently: Take 17 g by mouth daily. )   SitaGLIPtin-MetFORMIN HCl (JANUMET XR) 50-1000 MG TB24 Take 1 tablet by mouth daily.   Tetrahydrozoline HCl (VISINE OP) Place 1 drop into both eyes at bedtime.   vitamin C (ASCORBIC ACID) 500 MG tablet Take 500 mg by mouth daily.   Facility-Administered Encounter Medications as of 10/29/2022  Medication   aflibercept (EYLEA) SOLN 2 mg   Bevacizumab (AVASTIN) SOLN 1.25 mg   Bevacizumab (AVASTIN) SOLN 1.25 mg   Bevacizumab (AVASTIN) SOLN 1.25 mg    Bevacizumab (AVASTIN) SOLN 1.25 mg    HOSPICE ELIGIBILITY/DIAGNOSIS: TBD  PAST MEDICAL HISTORY:  Past Medical History:  Diagnosis Date   Atypical chest pain    a. non obstructive by cath in 2008 and 2010;  b. 02/2012 Myoview: non-ischemic, EF 57%   Cellulitis    a. left foot-> s/p L BKA   Chronic kidney disease    Constipation    CVA (cerebral infarction)    a.  Small right parietal noted incidentally 04/2007;  b. right sided embolic CVA 05/2012;  c. 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 near-syncopal episode in November 2008;  b. 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   Tobacco abuse    Vertigo      ALLERGIES:  Allergies  Allergen Reactions   Banana Swelling and Other (See Comments)    Facial swelling   Penicillins Itching, Swelling and Rash    Face swells and break out   Strawberry Extract Swelling and Other (See Comments)    Facial swelling      I spent  35 minutes providing this consultation; this includes time spent with patient/family, chart review and documentation. More than 50% of the time in this consultation was spent on counseling and coordinating communication   Thank you for the opportunity to participate in the care of LYLIAN TYMINSKI Please call our office at [phone] if we can be of additional assistance.  Note: Portions of this note were generated with Scientist, clinical (histocompatibility and immunogenetics). Dictation errors  may occur despite best attempts at proofreading.  Rosaura Carpenter, NP

## 2023-09-06 ENCOUNTER — Inpatient Hospital Stay (HOSPITAL_COMMUNITY)
Admission: EM | Admit: 2023-09-06 | Discharge: 2023-09-16 | DRG: 280 | Disposition: A | Discharge status: Skilled Nursing Facility | Source: Skilled Nursing Facility | Attending: Internal Medicine | Admitting: Internal Medicine

## 2023-09-06 ENCOUNTER — Emergency Department (HOSPITAL_COMMUNITY)

## 2023-09-06 DIAGNOSIS — E785 Hyperlipidemia, unspecified: Secondary | ICD-10-CM | POA: Diagnosis not present

## 2023-09-06 DIAGNOSIS — I272 Pulmonary hypertension, unspecified: Secondary | ICD-10-CM | POA: Diagnosis present

## 2023-09-06 DIAGNOSIS — E113299 Type 2 diabetes mellitus with mild nonproliferative diabetic retinopathy without macular edema, unspecified eye: Secondary | ICD-10-CM | POA: Diagnosis present

## 2023-09-06 DIAGNOSIS — Z89612 Acquired absence of left leg above knee: Secondary | ICD-10-CM | POA: Diagnosis not present

## 2023-09-06 DIAGNOSIS — Z89611 Acquired absence of right leg above knee: Secondary | ICD-10-CM

## 2023-09-06 DIAGNOSIS — E1142 Type 2 diabetes mellitus with diabetic polyneuropathy: Secondary | ICD-10-CM | POA: Diagnosis present

## 2023-09-06 DIAGNOSIS — Z1152 Encounter for screening for COVID-19: Secondary | ICD-10-CM | POA: Diagnosis not present

## 2023-09-06 DIAGNOSIS — I214 Non-ST elevation (NSTEMI) myocardial infarction: Secondary | ICD-10-CM | POA: Diagnosis present

## 2023-09-06 DIAGNOSIS — N171 Acute kidney failure with acute cortical necrosis: Secondary | ICD-10-CM | POA: Diagnosis not present

## 2023-09-06 DIAGNOSIS — E66813 Obesity, class 3: Secondary | ICD-10-CM | POA: Diagnosis present

## 2023-09-06 DIAGNOSIS — I472 Ventricular tachycardia, unspecified: Secondary | ICD-10-CM | POA: Diagnosis present

## 2023-09-06 DIAGNOSIS — K219 Gastro-esophageal reflux disease without esophagitis: Secondary | ICD-10-CM | POA: Diagnosis present

## 2023-09-06 DIAGNOSIS — Z833 Family history of diabetes mellitus: Secondary | ICD-10-CM

## 2023-09-06 DIAGNOSIS — R0602 Shortness of breath: Secondary | ICD-10-CM | POA: Diagnosis present

## 2023-09-06 DIAGNOSIS — Z6841 Body Mass Index (BMI) 40.0 and over, adult: Secondary | ICD-10-CM

## 2023-09-06 DIAGNOSIS — N1831 Chronic kidney disease, stage 3a: Secondary | ICD-10-CM | POA: Diagnosis present

## 2023-09-06 DIAGNOSIS — D539 Nutritional anemia, unspecified: Secondary | ICD-10-CM | POA: Diagnosis present

## 2023-09-06 DIAGNOSIS — Z8249 Family history of ischemic heart disease and other diseases of the circulatory system: Secondary | ICD-10-CM

## 2023-09-06 DIAGNOSIS — E871 Hypo-osmolality and hyponatremia: Secondary | ICD-10-CM | POA: Diagnosis present

## 2023-09-06 DIAGNOSIS — D509 Iron deficiency anemia, unspecified: Secondary | ICD-10-CM | POA: Diagnosis present

## 2023-09-06 DIAGNOSIS — E782 Mixed hyperlipidemia: Secondary | ICD-10-CM | POA: Diagnosis not present

## 2023-09-06 DIAGNOSIS — I130 Hypertensive heart and chronic kidney disease with heart failure and stage 1 through stage 4 chronic kidney disease, or unspecified chronic kidney disease: Secondary | ICD-10-CM | POA: Diagnosis present

## 2023-09-06 DIAGNOSIS — Z880 Allergy status to penicillin: Secondary | ICD-10-CM

## 2023-09-06 DIAGNOSIS — I25110 Atherosclerotic heart disease of native coronary artery with unstable angina pectoris: Secondary | ICD-10-CM | POA: Diagnosis present

## 2023-09-06 DIAGNOSIS — I5032 Chronic diastolic (congestive) heart failure: Secondary | ICD-10-CM | POA: Diagnosis present

## 2023-09-06 DIAGNOSIS — E1169 Type 2 diabetes mellitus with other specified complication: Secondary | ICD-10-CM | POA: Diagnosis present

## 2023-09-06 DIAGNOSIS — I10 Essential (primary) hypertension: Secondary | ICD-10-CM | POA: Diagnosis not present

## 2023-09-06 DIAGNOSIS — I509 Heart failure, unspecified: Secondary | ICD-10-CM | POA: Diagnosis not present

## 2023-09-06 DIAGNOSIS — Z7982 Long term (current) use of aspirin: Secondary | ICD-10-CM

## 2023-09-06 DIAGNOSIS — R54 Age-related physical debility: Secondary | ICD-10-CM | POA: Diagnosis present

## 2023-09-06 DIAGNOSIS — E1122 Type 2 diabetes mellitus with diabetic chronic kidney disease: Secondary | ICD-10-CM | POA: Diagnosis present

## 2023-09-06 DIAGNOSIS — Z8673 Personal history of transient ischemic attack (TIA), and cerebral infarction without residual deficits: Secondary | ICD-10-CM

## 2023-09-06 DIAGNOSIS — L89151 Pressure ulcer of sacral region, stage 1: Secondary | ICD-10-CM | POA: Diagnosis present

## 2023-09-06 DIAGNOSIS — I5023 Acute on chronic systolic (congestive) heart failure: Secondary | ICD-10-CM | POA: Diagnosis not present

## 2023-09-06 DIAGNOSIS — R7989 Other specified abnormal findings of blood chemistry: Secondary | ICD-10-CM | POA: Diagnosis not present

## 2023-09-06 DIAGNOSIS — I493 Ventricular premature depolarization: Secondary | ICD-10-CM | POA: Diagnosis not present

## 2023-09-06 DIAGNOSIS — J9601 Acute respiratory failure with hypoxia: Secondary | ICD-10-CM | POA: Diagnosis not present

## 2023-09-06 DIAGNOSIS — D72829 Elevated white blood cell count, unspecified: Secondary | ICD-10-CM | POA: Diagnosis present

## 2023-09-06 DIAGNOSIS — Z79899 Other long term (current) drug therapy: Secondary | ICD-10-CM

## 2023-09-06 DIAGNOSIS — L8990 Pressure ulcer of unspecified site, unspecified stage: Secondary | ICD-10-CM

## 2023-09-06 DIAGNOSIS — I2510 Atherosclerotic heart disease of native coronary artery without angina pectoris: Secondary | ICD-10-CM | POA: Diagnosis not present

## 2023-09-06 DIAGNOSIS — Z89511 Acquired absence of right leg below knee: Secondary | ICD-10-CM | POA: Diagnosis not present

## 2023-09-06 DIAGNOSIS — I5021 Acute systolic (congestive) heart failure: Secondary | ICD-10-CM | POA: Diagnosis not present

## 2023-09-06 DIAGNOSIS — I5043 Acute on chronic combined systolic (congestive) and diastolic (congestive) heart failure: Secondary | ICD-10-CM | POA: Diagnosis present

## 2023-09-06 DIAGNOSIS — Z7984 Long term (current) use of oral hypoglycemic drugs: Secondary | ICD-10-CM

## 2023-09-06 DIAGNOSIS — E7800 Pure hypercholesterolemia, unspecified: Secondary | ICD-10-CM | POA: Diagnosis present

## 2023-09-06 DIAGNOSIS — L89311 Pressure ulcer of right buttock, stage 1: Secondary | ICD-10-CM | POA: Diagnosis not present

## 2023-09-06 DIAGNOSIS — Z794 Long term (current) use of insulin: Secondary | ICD-10-CM | POA: Diagnosis not present

## 2023-09-06 DIAGNOSIS — I255 Ischemic cardiomyopathy: Secondary | ICD-10-CM | POA: Diagnosis present

## 2023-09-06 DIAGNOSIS — Z515 Encounter for palliative care: Secondary | ICD-10-CM | POA: Diagnosis not present

## 2023-09-06 DIAGNOSIS — I2489 Other forms of acute ischemic heart disease: Secondary | ICD-10-CM | POA: Diagnosis present

## 2023-09-06 DIAGNOSIS — Z89512 Acquired absence of left leg below knee: Secondary | ICD-10-CM | POA: Diagnosis not present

## 2023-09-06 DIAGNOSIS — Z7902 Long term (current) use of antithrombotics/antiplatelets: Secondary | ICD-10-CM

## 2023-09-06 DIAGNOSIS — F17210 Nicotine dependence, cigarettes, uncomplicated: Secondary | ICD-10-CM | POA: Diagnosis present

## 2023-09-06 DIAGNOSIS — E119 Type 2 diabetes mellitus without complications: Secondary | ICD-10-CM

## 2023-09-06 DIAGNOSIS — Z7189 Other specified counseling: Secondary | ICD-10-CM | POA: Diagnosis not present

## 2023-09-06 DIAGNOSIS — Z91018 Allergy to other foods: Secondary | ICD-10-CM

## 2023-09-06 DIAGNOSIS — N179 Acute kidney failure, unspecified: Secondary | ICD-10-CM | POA: Clinically undetermined

## 2023-09-06 LAB — TROPONIN I (HIGH SENSITIVITY)
Troponin I (High Sensitivity): 168 ng/L (ref <18)
Troponin I (High Sensitivity): 5743 ng/L (ref <18)

## 2023-09-06 LAB — RESP PANEL BY RT-PCR (RSV, FLU A&B, COVID)  RVPGX2
Influenza A by PCR: NEGATIVE
Influenza B by PCR: NEGATIVE
Resp Syncytial Virus by PCR: NEGATIVE
SARS Coronavirus 2 by RT PCR: NEGATIVE

## 2023-09-06 LAB — COMPREHENSIVE METABOLIC PANEL WITH GFR
ALT: 18 U/L (ref 0–44)
AST: 32 U/L (ref 15–41)
Albumin: 2 g/dL — ABNORMAL LOW (ref 3.5–5.0)
Alkaline Phosphatase: 106 U/L (ref 38–126)
Anion gap: 10 (ref 5–15)
BUN: 18 mg/dL (ref 8–23)
CO2: 19 mmol/L — ABNORMAL LOW (ref 22–32)
Calcium: 6.8 mg/dL — ABNORMAL LOW (ref 8.9–10.3)
Chloride: 110 mmol/L (ref 98–111)
Creatinine, Ser: 1.23 mg/dL — ABNORMAL HIGH (ref 0.44–1.00)
GFR, Estimated: 48 mL/min — ABNORMAL LOW (ref >60)
Glucose, Bld: 278 mg/dL — ABNORMAL HIGH (ref 70–99)
Potassium: 4.7 mmol/L (ref 3.5–5.1)
Sodium: 139 mmol/L (ref 135–145)
Total Bilirubin: 1.3 mg/dL — ABNORMAL HIGH (ref 0.0–1.2)
Total Protein: 5.7 g/dL — ABNORMAL LOW (ref 6.5–8.1)

## 2023-09-06 LAB — CBC WITH DIFFERENTIAL/PLATELET
Abs Immature Granulocytes: 0.14 10*3/uL — ABNORMAL HIGH (ref 0.00–0.07)
Basophils Absolute: 0.1 10*3/uL (ref 0.0–0.1)
Basophils Relative: 0 %
Eosinophils Absolute: 0.1 10*3/uL (ref 0.0–0.5)
Eosinophils Relative: 1 %
HCT: 39.9 % (ref 36.0–46.0)
Hemoglobin: 11.6 g/dL — ABNORMAL LOW (ref 12.0–15.0)
Immature Granulocytes: 1 %
Lymphocytes Relative: 8 %
Lymphs Abs: 1.4 10*3/uL (ref 0.7–4.0)
MCH: 25.6 pg — ABNORMAL LOW (ref 26.0–34.0)
MCHC: 29.1 g/dL — ABNORMAL LOW (ref 30.0–36.0)
MCV: 87.9 fL (ref 80.0–100.0)
Monocytes Absolute: 0.4 10*3/uL (ref 0.1–1.0)
Monocytes Relative: 2 %
Neutro Abs: 15.7 10*3/uL — ABNORMAL HIGH (ref 1.7–7.7)
Neutrophils Relative %: 88 %
Platelets: 271 10*3/uL (ref 150–400)
RBC: 4.54 MIL/uL (ref 3.87–5.11)
RDW: 19.9 % — ABNORMAL HIGH (ref 11.5–15.5)
WBC: 17.8 10*3/uL — ABNORMAL HIGH (ref 4.0–10.5)
nRBC: 0.1 % (ref 0.0–0.2)

## 2023-09-06 LAB — BRAIN NATRIURETIC PEPTIDE: B Natriuretic Peptide: 359.9 pg/mL — ABNORMAL HIGH (ref 0.0–100.0)

## 2023-09-06 MED ORDER — MELATONIN 3 MG PO TABS: 3.0000 mg | ORAL_TABLET | Freq: Every evening | ORAL | Status: DC | PRN

## 2023-09-06 MED ORDER — HEPARIN (PORCINE) 25000 UT/250ML-% IV SOLN
1300.0000 [IU]/h | INTRAVENOUS | Status: DC
  Administered 2023-09-06: 1200 [IU]/h via INTRAVENOUS
  Administered 2023-09-07: 1300 [IU]/h via INTRAVENOUS
  Filled 2023-09-06 (×2): qty 250

## 2023-09-06 MED ORDER — INSULIN ASPART 100 UNIT/ML IJ SOLN
0.0000 [IU] | Freq: Four times a day (QID) | INTRAMUSCULAR | Status: DC
  Administered 2023-09-07: 2 [IU] via SUBCUTANEOUS
  Administered 2023-09-07: 3 [IU] via SUBCUTANEOUS

## 2023-09-06 MED ORDER — ACETAMINOPHEN 650 MG RE SUPP: 650.0000 mg | Freq: Four times a day (QID) | RECTAL | Status: DC | PRN

## 2023-09-06 MED ORDER — HEPARIN BOLUS VIA INFUSION
4000.0000 [IU] | Freq: Once | INTRAVENOUS | Status: AC
  Administered 2023-09-06: 4000 [IU] via INTRAVENOUS
  Filled 2023-09-06: qty 4000

## 2023-09-06 MED ORDER — IPRATROPIUM-ALBUTEROL 0.5-2.5 (3) MG/3ML IN SOLN
3.0000 mL | Freq: Once | RESPIRATORY_TRACT | Status: AC
  Administered 2023-09-06: 3 mL via RESPIRATORY_TRACT
  Filled 2023-09-06: qty 3

## 2023-09-06 MED ORDER — ACETAMINOPHEN 325 MG PO TABS
650.0000 mg | ORAL_TABLET | Freq: Four times a day (QID) | ORAL | Status: DC | PRN
  Administered 2023-09-07: 650 mg via ORAL
  Filled 2023-09-06: qty 2

## 2023-09-06 MED ORDER — ONDANSETRON HCL 4 MG/2ML IJ SOLN: 4.0000 mg | Freq: Four times a day (QID) | INTRAMUSCULAR | Status: DC | PRN

## 2023-09-06 MED ORDER — SODIUM CHLORIDE 0.9 % IV BOLUS
500.0000 mL | Freq: Once | INTRAVENOUS | Status: AC
  Administered 2023-09-06: 500 mL via INTRAVENOUS

## 2023-09-06 MED ORDER — IOHEXOL 350 MG/ML SOLN
75.0000 mL | Freq: Once | INTRAVENOUS | Status: AC | PRN
  Administered 2023-09-06: 75 mL via INTRAVENOUS

## 2023-09-06 MED ORDER — ASPIRIN 81 MG PO CHEW
324.0000 mg | CHEWABLE_TABLET | Freq: Once | ORAL | Status: AC
  Administered 2023-09-06: 324 mg via ORAL
  Filled 2023-09-06: qty 4

## 2023-09-06 NOTE — ED Provider Notes (Signed)
 Patient was initially seen by Dr. Dolan Freiberg.  Please see his note.  Patient presented to the ED for evaluation of abnormal oxygen level.  Patient was noted to have a chest x-ray showing cardiomegaly with central congestion.  Patient had an initial troponin that was slightly elevated at 168.  With her symptoms a CT angio was ordered.  Patient's second troponin is higher at 5743  Patient denies any chest pain.  This is unusually high for CHF exacerbation but CHF PE is still a consideration.  Patient will need to be admitted to the hospital.  Will consult with cardiology while waiting for her CT angiogram result  CT angio negative for PE.  Case discussed with cardiology.  Clinical Course as of 09/06/23 2046  Sun Sep 06, 2023  1946 CT angiogram does not show evidence of pulmonary embolism. [JK]  2015 Discussed with cardiology.  Recommend medical admission and they will consult on patient [JK]  2045 Case discussed with Dr Brock Canner [JK]    Clinical Course User Index [JK] Trish Furl, MD      Trish Furl, MD 09/06/23 2015

## 2023-09-06 NOTE — ED Triage Notes (Signed)
 Pt to the ed from [facility]. Staff was checking pt vitals and pt was found to have low SPO2 and blood pressure. Pt denies any complaints at this time. Pt A&Ox4

## 2023-09-06 NOTE — Progress Notes (Signed)
 PHARMACY - ANTICOAGULATION CONSULT NOTE  Pharmacy Consult for heparin  Indication: chest pain/ACS  Allergies  Allergen Reactions   Banana Swelling and Other (See Comments)    Facial swelling   Penicillins Itching, Swelling and Rash    Face swells and break out   Strawberry Extract Swelling and Other (See Comments)    Facial swelling    Patient Measurements: Height: 5\' 6"  (167.6 cm) Weight: 112 kg (246 lb 14.6 oz) IBW/kg (Calculated) : 59.3 HEPARIN  DW (KG): 85.5  Vital Signs: Temp: 97.8 F (36.6 C) (04/20 1851) Temp Source: Oral (04/20 1851) BP: 132/75 (04/20 1830) Pulse Rate: 86 (04/20 1830)  Labs: Recent Labs    09/06/23 1442 09/06/23 1800  HGB 11.6*  --   HCT 39.9  --   PLT 271  --   CREATININE 1.23*  --   TROPONINIHS 168* 5,743*    Estimated Creatinine Clearance: 55.6 mL/min (A) (by C-G formula based on SCr of 1.23 mg/dL (H)).   Medical History: Past Medical History:  Diagnosis Date   Atypical chest pain    a. non obstructive by cath in 2008 and 2010;  b. 02/2012 Myoview : non-ischemic, EF 57%   Cellulitis    a. left foot-> s/p L BKA   Chronic kidney disease    Constipation    CVA (cerebral infarction)    a.  Small right parietal noted incidentally 04/2007;  b. right sided embolic CVA 05/2012;  c. 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 near-syncopal episode in November 2008;  b. s/p prior ILR->  unrevealing->explanted.   Tobacco abuse    Vertigo     Assessment: 22 YOF presenting with concern for ACS, he is not on anticoagulation PTA  Goal of Therapy:  Heparin  level 0.3-0.7 units/ml Monitor platelets by anticoagulation protocol: Yes   Plan:  Heparin  4000 units IV x 1, and gtt at 1200 units/hr F/u 6 hour heparin  level F/u cards eval and recs   Trinidad Funk, PharmD, [facility] Clinical Pharmacist ED Pharmacist Phone # [phone] 09/06/2023 8:40 PM

## 2023-09-06 NOTE — H&P (Signed)
 History and Physical      [name] VHQ:[id number] DOB: 1955-12-18 DOA: 09/06/2023; DOS: 09/06/2023  PCP: Marquetta Sit, MD *** Patient coming from: home ***  I have personally briefly reviewed patient's old medical records in [facility]  Chief Complaint: ***  HPI: [name] is a 68 y.o. female with medical history significant for *** who is admitted to [facility] on 09/06/2023 with *** after presenting from home*** to [facility] ED complaining of ***.    ***       ***   ED Course:  Vital signs in the ED were notable for the following: ***  Labs were notable for the following: ***  Per my interpretation, EKG in ED demonstrated the following:  ***  Imaging in the ED, per corresponding formal radiology read, was notable for the following:  ***  EDP discussed patient's case with on-call cardiology, who will formally consult, with preliminary recommendations for full dose aspirin  x 1 as well as heparin  drip, with additional cardiology recommendations to follow.   While in the ED, the following were administered: ***  Subsequently, the patient was admitted  ***  ***red    Review of Systems: As per HPI otherwise 10 point review of systems negative.   Past Medical History:  Diagnosis Date   Atypical chest pain    a. non obstructive by cath in 2008 and 2010;  b. 02/2012 Myoview : non-ischemic, EF 57%   Cellulitis    a. left foot-> s/p L BKA   Chronic kidney disease    Constipation    CVA (cerebral infarction)    a.  Small right parietal noted incidentally 04/2007;  b. right sided embolic CVA 05/2012;  c. 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 near-syncopal episode in November 2008;  b. 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   Tobacco abuse    Vertigo     Past Surgical History:  Procedure Laterality Date   AMPUTATION  12/30/2011   Procedure: AMPUTATION RAY;  Surgeon: Amada Backer, MD;  Location: [facility] OR;  Service: Orthopedics;  Laterality: Left;  LEFT FIRST RAY AMPUTATION   AMPUTATION  01/13/2012   Procedure: AMPUTATION BELOW KNEE;  Surgeon: Amada Backer, MD;  Location: [facility] OR;  Service: Orthopedics;  Laterality: Left;   AMPUTATION  03/04/2012   Procedure: AMPUTATION BELOW KNEE;  Surgeon: Amada Backer, MD;  Location: [facility] OR;  Service: Orthopedics;  Laterality: Left;  Revision of Left Below Knee Amputation   AMPUTATION Right 11/02/2013   Procedure: AMPUTATION BELOW KNEE;  Surgeon: Timothy Ford, MD;  Location: [facility] OR;  Service: Orthopedics;  Laterality: Right;  Right Below Knee Amputation   BIOPSY  03/29/2019   Procedure: BIOPSY;  Surgeon: Annis Kinder, DO;  Location: [facility]  ENDOSCOPY;  Service: Gastroenterology;;  EGD and Colon   CARDIAC CATHETERIZATION     LAD 30%, circumflex 50%, OM 75%, RI 60% with small branch 80%, dominant RCA 60%, EF 45-50%   CHOLECYSTECTOMY     COLONOSCOPY     COLONOSCOPY WITH PROPOFOL  N/A 03/29/2019   Procedure: COLONOSCOPY WITH PROPOFOL ;  Surgeon: Annis Kinder, DO;  Location: [facility] ENDOSCOPY;  Service: Gastroenterology;  Laterality: N/A;   ESOPHAGOGASTRODUODENOSCOPY (EGD) WITH PROPOFOL  N/A 03/29/2019   Procedure: ESOPHAGOGASTRODUODENOSCOPY (EGD) WITH PROPOFOL ;  Surgeon: Annis Kinder, DO;  Location: [facility] ENDOSCOPY;  Service: Gastroenterology;  Laterality: N/A;   EYE SURGERY Right    cataract   I & D EXTREMITY  12/23/2011   Procedure:  IRRIGATION AND DEBRIDEMENT EXTREMITY;  Surgeon: Lorriane Rote, MD;  Location: [facility] OR;  Service: Orthopedics;  Laterality: Left;  Left Foot   I & D EXTREMITY  12/26/2011   Procedure: IRRIGATION AND DEBRIDEMENT EXTREMITY;  Surgeon: Amada Backer, MD;  Location: [facility] OR;  Service: Orthopedics;  Laterality: Left;  LEFT FOOT I&D WITH POSSIBLE WOUND VAC APPLICATION, POSSIBLE LEFT FIRST RAY AMPUTATION   I & D EXTREMITY Right 10/29/2013   Procedure: IRRIGATION AND DEBRIDEMENT EXTREMITY;  Surgeon: Loel Ring, MD;  Location: [facility] OR;  Service: Orthopedics;  Laterality: Right;   Invasive Electrophysiologic Study  5/09   followed by insertion of an implantable loop recorder. S/p removal    Multiple Toe Surgeries     POLYPECTOMY  03/29/2019   Procedure: POLYPECTOMY;  Surgeon: Annis Kinder, DO;  Location: [facility] ENDOSCOPY;  Service: Gastroenterology;;   TEE WITHOUT CARDIOVERSION N/A 07/07/2012   Procedure: TRANSESOPHAGEAL ECHOCARDIOGRAM (TEE);  Surgeon: Lenise Quince, MD;  Location: [facility] ENDOSCOPY;  Service: Cardiovascular;  Laterality: N/A;    Social History:  reports that she has quit smoking. Her smoking use included cigarettes. She has a 10 pack-year smoking history. She has never used smokeless tobacco. She reports current alcohol use of about 4.0 standard drinks of alcohol per week. She reports that she does not use drugs.   Allergies  Allergen Reactions   Banana Swelling and Other (See Comments)    Facial swelling   Penicillins Itching, Swelling and Rash    Face swells and break out   Strawberry Extract Swelling and Other (See Comments)    Facial swelling    Family History  Problem Relation Age of Onset   Pneumonia Mother    Gallbladder disease Mother        cancer   Heart failure Mother    Diabetes Father    Coronary artery disease Father    Stroke Neg Hx     Family history reviewed and not pertinent ***   Prior to Admission medications   Medication Sig Start Date End Date Taking?  Authorizing Provider  albuterol  (VENTOLIN  HFA) 108 (90 Base) MCG/ACT inhaler Inhale 2 puffs into the lungs every 4 (four) hours as needed for wheezing or shortness of breath.    [provider]  amLODipine  (NORVASC ) 2.5 MG tablet Take 2.5 mg by mouth daily. 06/14/18   [provider]  aspirin  EC 81 MG tablet Take 81 mg by mouth daily.    [provider]  atorvastatin  (LIPITOR ) 20 MG tablet Take 20 mg by mouth daily. 05/22/18   [provider]  carvedilol (COREG) 12.5 MG tablet Take 12.5 mg by mouth 2 (two) times daily.  06/08/18   [provider]  cetirizine (ZYRTEC) 10 MG tablet Take 10 mg by  mouth daily.    [provider]  Cholecalciferol (VITAMIN D) 50 MCG (2000 UT) tablet Take 2,000 Units by mouth daily.    [provider]  cyanocobalamin  1000 MCG tablet Take 1,000 mcg by mouth daily.    [provider]  docusate sodium  100 MG CAPS Take 100 mg by mouth 2 (two) times daily. 11/04/13   Rizwan, Saima, MD  ferrous sulfate  325 (65 FE) MG tablet Take 325 mg by mouth daily with breakfast.    [provider]  gabapentin (NEURONTIN) 100 MG capsule Take 100 mg by mouth See admin instructions. Take with 400 mg morning dose to equal 500 mg in the morning, take with 600 mg night dose to equal 700 mg at night    [provider]  gabapentin (NEURONTIN) 400 MG capsule Take 400 mg by mouth 2 (two) times daily.  06/02/18   [provider]  gabapentin (NEURONTIN) 600 MG tablet Take 600 mg by mouth at bedtime.    [provider]  hydrALAZINE  (APRESOLINE ) 25 MG tablet Take 75 mg by mouth 3 (three) times daily.    [provider]  HYDROcodone -acetaminophen  (NORCO/VICODIN) 5-325 MG tablet Take 1 tablet by mouth every 8 (eight) hours as needed for moderate pain.     [provider]  insulin  lispro (HUMALOG) 100 UNIT/ML injection Inject 2-14 Units into the skin 4 (four) times daily -  with meals and at  bedtime. 201-250 = 2 units, 251-300 = 4 units, 301-350= 6 units, 351-400=8 units, 401-450=10 units, 451-500 =12 units >500 = 14 units    [provider]  LEVEMIR 100 UNIT/ML injection Inject 53 Units into the skin at bedtime.  06/02/18   [provider]  lisinopril -hydrochlorothiazide  (ZESTORETIC) 20-12.5 MG tablet Take 2 tablets by mouth daily.    [provider]  PAZEO 0.7 % SOLN Place 1 drop into both eyes daily.  05/27/18   [provider]  polyethylene glycol (MIRALAX  / GLYCOLAX ) packet Take 17 g by mouth daily as needed for moderate constipation. Patient taking differently: Take 17 g by mouth daily.  11/04/13   Rizwan, Saima, MD  SitaGLIPtin-MetFORMIN  HCl (JANUMET XR) 50-1000 MG TB24 Take 1 tablet by mouth daily.    [provider]  Tetrahydrozoline HCl (VISINE OP) Place 1 drop into both eyes at bedtime.    [provider]  vitamin C  (ASCORBIC ACID) 500 MG tablet Take 500 mg by mouth daily.    [provider]     Objective    Physical Exam: Vitals:   09/06/23 1500 09/06/23 1700 09/06/23 1830 09/06/23 1851  BP: 112/75 (!) 127/114 132/75   Pulse: (!) 103 94 86   Resp: (!) 33 (!) 29 (!) 28   Temp:    97.8 F (36.6 C)  TempSrc:    Oral  SpO2: 95% 98% 97%   Weight:      Height:        General: appears to be stated age; alert, oriented Skin: warm, dry, no rash Head:  AT/[employer] Mouth:  Oral mucosa membranes appear moist, normal dentition Neck: supple; trachea midline Heart:  RRR; did not appreciate any M/R/G Lungs: CTAB, did not appreciate any wheezes, rales, or rhonchi Abdomen: + BS; soft, ND, NT Vascular: 2+ pedal pulses b/l; 2+ radial pulses b/l Extremities: no peripheral edema, no muscle wasting Neuro: strength and sensation intact in upper and lower extremities b/l ***   *** Neuro: 5/5 strength of the proximal and distal flexors and  extensors of the upper and lower extremities bilaterally; sensation intact in upper  and lower extremities b/l; cranial nerves II through XII grossly intact; no pronator drift; no evidence suggestive of slurred speech, dysarthria, or facial droop; Normal muscle tone. No tremors.  *** Neuro: In the setting of the patient's current mental status and associated inability to follow instructions, unable to perform full neurologic exam at this time.  As such, assessment of strength, sensation, and cranial nerves is limited at this time. Patient noted to spontaneously move all 4 extremities. No tremors.  ***    Labs on Admission: I have personally reviewed following labs and imaging studies  CBC: Recent Labs  Lab 09/06/23 1442  WBC 17.8*  NEUTROABS 15.7*  HGB 11.6*  HCT 39.9  MCV 87.9  PLT 271   Basic Metabolic Panel: Recent Labs  Lab 09/06/23 1442  NA 139  K 4.7  CL 110  CO2 19*  GLUCOSE 278*  BUN 18  CREATININE 1.23*  CALCIUM  6.8*   GFR: Estimated Creatinine Clearance: 55.6 mL/min (A) (by C-G formula based on SCr of 1.23 mg/dL (H)). Liver Function Tests: Recent Labs  Lab 09/06/23 1442  AST 32  ALT 18  ALKPHOS 106  BILITOT 1.3*  PROT 5.7*  ALBUMIN  2.0*   No results for input(s): "LIPASE", "AMYLASE" in the last 168 hours. No results for input(s): "AMMONIA" in the last 168 hours. Coagulation Profile: No results for input(s): "INR", "PROTIME" in the last 168 hours. Cardiac Enzymes: No results for input(s): "CKTOTAL", "CKMB", "CKMBINDEX", "TROPONINI" in the last 168 hours. BNP (last 3 results) No results for input(s): "PROBNP" in the last 8760 hours. HbA1C: No results for input(s): "HGBA1C" in the last 72 hours. CBG: No results for input(s): "GLUCAP" in the last 168 hours. Lipid Profile: No results for input(s): "CHOL", "HDL", "LDLCALC", "TRIG", "CHOLHDL", "LDLDIRECT" in the last 72 hours. Thyroid  Function Tests: No results for input(s): "TSH", "T4TOTAL", "FREET4", "T3FREE", "THYROIDAB" in the last 72 hours. Anemia Panel: No results for input(s):  "VITAMINB12", "FOLATE", "FERRITIN", "TIBC", "IRON ", "RETICCTPCT" in the last 72 hours. Urine analysis:    Component Value Date/Time   COLORURINE YELLOW 12/31/2014 1928   APPEARANCEUR CLOUDY (A) 12/31/2014 1928   LABSPEC 1.025 12/31/2014 1928   PHURINE 7.5 12/31/2014 1928   GLUCOSEU NEGATIVE 12/31/2014 1928   HGBUR MODERATE (A) 12/31/2014 1928   BILIRUBINUR NEGATIVE 12/31/2014 1928   KETONESUR NEGATIVE 12/31/2014 1928   PROTEINUR 30 (A) 12/31/2014 1928   UROBILINOGEN 0.2 12/31/2014 1928   NITRITE POSITIVE (A) 12/31/2014 1928   LEUKOCYTESUR MODERATE (A) 12/31/2014 1928    Radiological Exams on Admission: CT Angio Chest PE W and/or Wo Contrast Result Date: 09/06/2023 CLINICAL DATA:  Shortness of breath EXAM: CT ANGIOGRAPHY CHEST WITH CONTRAST TECHNIQUE: Multidetector CT imaging of the chest was performed using the standard protocol during bolus administration of intravenous contrast. Multiplanar CT image reconstructions and MIPs were obtained to evaluate the vascular anatomy. RADIATION DOSE REDUCTION: This exam was performed according to the departmental dose-optimization program which includes automated exposure control, adjustment of the mA and/or kV according to patient size and/or use of iterative reconstruction technique. CONTRAST:  75mL OMNIPAQUE  IOHEXOL  350 MG/ML SOLN COMPARISON:  Chest x-ray 09/06/2023 CT report 06/09/2018 FINDINGS: Cardiovascular: Satisfactory opacification of the pulmonary arteries to the segmental level. No evidence of pulmonary embolism. Moderate aortic atherosclerosis. No aneurysm. Coronary vascular calcification. Mild cardiomegaly. Small pericardial effusion Mediastinum/Nodes: Patent trachea. No thyroid  mass. Subcentimeter mediastinal lymph nodes. Prominent right hilar node measuring 16  mm. Esophagus within normal limits Lungs/Pleura: Small bilateral pleural effusions. Generalized hazy pulmonary densities suspected to represent edema. Mild passive atelectasis in the  lower lobes Upper Abdomen: No acute finding partially visualized 2.1 cm left adrenal nodule, stable per prior report Musculoskeletal: Multilevel degenerative changes. No acute osseous abnormality Review of the MIP images confirms the above findings. IMPRESSION: 1. Negative for acute pulmonary embolus. 2. Cardiomegaly with small bilateral pleural effusions. Diffuse hazy pulmonary density probably represents mild pulmonary edema 3. Mildly prominent right hilar lymph node, nonspecific Aortic Atherosclerosis (ICD10-I70.0). Electronically Signed   By: Esmeralda Hedge M.D.   On: 09/06/2023 19:39   DG Chest Portable 1 View Result Date: 09/06/2023 CLINICAL DATA:  Shortness of breath EXAM: PORTABLE CHEST 1 VIEW COMPARISON:  10/29/2013 FINDINGS: Low lung volumes. Cardiomegaly with mild central congestion. No pleural effusion, focal airspace disease or pneumothorax. IMPRESSION: Cardiomegaly with mild central congestion. Electronically Signed   By: Esmeralda Hedge M.D.   On: 09/06/2023 16:11      Assessment/Plan   Principal Problem:   NSTEMI (non-ST elevated myocardial infarction) (HCC)   ***            ***                  ***                   ***                  ***                  ***                  ***                   ***                  ***                  ***                  ***                  ***                 ***                ***  DVT prophylaxis: SCD's ***  Code Status: Full code*** Family Communication: none*** Disposition Plan: Per Rounding Team Consults called: EDP discussed patient's case with on-call cardiology, who will formally consult, with preliminary recommendations for full dose aspirin  x 1 as well as heparin  drip, with additional cardiology  recommendations to follow. ;  Admission status: ***     I SPENT GREATER THAN 75 *** MINUTES IN CLINICAL CARE TIME/MEDICAL DECISION-MAKING IN COMPLETING THIS ADMISSION.      Gattis Kass Noora Locascio DO [facility]  From 7PM - 7AM   09/06/2023, 8:56 PM   ***

## 2023-09-06 NOTE — ED Notes (Signed)
 PT asked for water, no diet order was set. Abby Hocking MD was ok with PT having ice chips but no water.This was explained to PT and PT was given a 1/2 cup of ice with directions to suck on ice. Family is at bedside.

## 2023-09-06 NOTE — ED Provider Notes (Signed)
 Emergency Department Provider Note   I have reviewed the triage vital signs and the nursing notes.   HISTORY  Chief Complaint Shortness of Breath   HPI [name] is a 68 y.o. female past history of diabetes, CKD, prior stroke, peripheral neuropathy presents to the emergency department from [facility] with hypoxemia and hypotension.  Patient states that she is feeling relatively well without any appreciable shortness of breath.  EMS found her to be hypoxemic and started her on 3 L nasal cannula.  She is not on oxygen at baseline.  Patient denies any chest pain. Denies abdominal pain or vomiting.    Past Medical History:  Diagnosis Date   Atypical chest pain    a. non obstructive by cath in 2008 and 2010;  b. 02/2012 Myoview : non-ischemic, EF 57%   Cellulitis    a. left foot-> s/p L BKA   Chronic kidney disease    Constipation    CVA (cerebral infarction)    a.  Small right parietal noted incidentally 04/2007;  b. right sided embolic CVA 05/2012;  c. 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 near-syncopal episode in November 2008;  b. 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   Tobacco abuse    Vertigo     Review of Systems  Constitutional: No fever/chills Cardiovascular: Denies chest pain. Respiratory: Positive shortness of breath. Gastrointestinal: No abdominal pain.  No nausea, no vomiting.   Skin:  Negative for rash. Neurological: Negative for headaches, focal weakness or numbness.  ____________________________________________   PHYSICAL EXAM:  VITAL SIGNS: ED Triage Vitals  Encounter Vitals Group     BP 09/06/23 1358 102/79     Pulse Rate 09/06/23 1358 (!) 108     Resp 09/06/23 1358 (!) 32     Temp 09/06/23 1358 (!) 97 F (36.1 C)     Temp Source 09/06/23 1358 Oral     SpO2 09/06/23 1358 98 %     Weight 09/06/23 1353 246 lb 14.6 oz (112 kg)     Height 09/06/23 1353 5\' 6"  (1.676 m)   Constitutional: Alert and oriented. Well appearing and in no acute distress. Eyes: Conjunctivae are normal.  Head: Atraumatic. Nose: No congestion/rhinnorhea. Mouth/Throat: Mucous membranes are moist.   Neck: No stridor.   Cardiovascular: Tachycardia. Good peripheral circulation. Grossly normal heart sounds.   Respiratory: Normal respiratory effort.  No retractions. Lungs with mild end expiratory wheezing. Gastrointestinal: Soft and nontender. No distention.  Musculoskeletal: Bilateral BKAs. Neurologic:  Normal speech and language.  Skin:  Skin is warm, dry and intact. No rash noted.  ____________________________________________   LABS (all labs ordered are listed, but only abnormal results are displayed)  Labs Reviewed  COMPREHENSIVE METABOLIC PANEL WITH GFR - Abnormal; Notable for the following components:  Result Value   CO2 19 (*)    Glucose, Bld 278 (*)    Creatinine, Ser 1.23 (*)    Calcium  6.8 (*)    Total Protein 5.7 (*)    Albumin  2.0 (*)    Total Bilirubin 1.3 (*)    GFR, Estimated 48 (*)    All other components within normal limits  BRAIN NATRIURETIC PEPTIDE - Abnormal; Notable for the following components:   B Natriuretic Peptide 359.9 (*)    All other components within normal limits  CBC WITH DIFFERENTIAL/PLATELET - Abnormal; Notable for the following components:   WBC 17.8 (*)    Hemoglobin 11.6 (*)    MCH 25.6 (*)    MCHC 29.1 (*)    RDW 19.9 (*)     Neutro Abs 15.7 (*)    Abs Immature Granulocytes 0.14 (*)    All other components within normal limits  HEPARIN  LEVEL (UNFRACTIONATED) - Abnormal; Notable for the following components:   Heparin  Unfractionated 0.27 (*)    All other components within normal limits  CBC - Abnormal; Notable for the following components:   WBC 15.2 (*)    Hemoglobin 11.2 (*)    MCH 25.3 (*)    MCHC 29.6 (*)    RDW 19.5 (*)    All other components within normal limits  COMPREHENSIVE METABOLIC PANEL WITH GFR - Abnormal; Notable for the following components:   Glucose, Bld 221 (*)    BUN 24 (*)    Creatinine, Ser 1.50 (*)    Calcium  8.0 (*)    Albumin  2.6 (*)    AST 112 (*)    Alkaline Phosphatase 129 (*)    GFR, Estimated 38 (*)    All other components within normal limits  HEMOGLOBIN A1C - Abnormal; Notable for the following components:   Hgb A1c MFr Bld 7.3 (*)    All other components within normal limits  GLUCOSE, CAPILLARY - Abnormal; Notable for the following components:   Glucose-Capillary 233 (*)    All other components within normal limits  LIPID PANEL - Abnormal; Notable for the following components:   HDL 40 (*)    All other components within normal limits  URINALYSIS, COMPLETE (UACMP) WITH MICROSCOPIC - Abnormal; Notable for the following components:   APPearance CLOUDY (*)    Specific Gravity, Urine 1.034 (*)    Hgb urine dipstick SMALL (*)    Protein, ur 100 (*)    Leukocytes,Ua LARGE (*)    Bacteria, UA RARE (*)    Non Squamous Epithelial 0-5 (*)    All other components within normal limits  HEPARIN  LEVEL (UNFRACTIONATED) - Abnormal; Notable for the following components:   Heparin  Unfractionated 0.27 (*)    All other components within normal limits  BRAIN NATRIURETIC PEPTIDE - Abnormal; Notable for the following components:   B Natriuretic Peptide 1,077.5 (*)    All other components within normal limits  GLUCOSE, CAPILLARY - Abnormal; Notable for the following components:    Glucose-Capillary 187 (*)    All other components within normal limits  GLUCOSE, CAPILLARY - Abnormal; Notable for the following components:   Glucose-Capillary 175 (*)    All other components within normal limits  GLUCOSE, CAPILLARY - Abnormal; Notable for the following components:   Glucose-Capillary 195 (*)    All other components within normal limits  CBC - Abnormal; Notable for the following components:   WBC 12.2 (*)    Hemoglobin 10.0 (*)    HCT 33.0 (*)  MCH 25.4 (*)    RDW 19.0 (*)    All other components within normal limits  LIPOPROTEIN A (LPA) - Abnormal; Notable for the following components:   Lipoprotein (a) 78.7 (*)    All other components within normal limits  BASIC METABOLIC PANEL WITH GFR - Abnormal; Notable for the following components:   Glucose, Bld 168 (*)    BUN 30 (*)    Creatinine, Ser 1.76 (*)    Calcium  7.6 (*)    GFR, Estimated 31 (*)    All other components within normal limits  GLUCOSE, CAPILLARY - Abnormal; Notable for the following components:   Glucose-Capillary 205 (*)    All other components within normal limits  GLUCOSE, CAPILLARY - Abnormal; Notable for the following components:   Glucose-Capillary 204 (*)    All other components within normal limits  HEPARIN  LEVEL (UNFRACTIONATED) - Abnormal; Notable for the following components:   Heparin  Unfractionated 0.26 (*)    All other components within normal limits  GLUCOSE, CAPILLARY - Abnormal; Notable for the following components:   Glucose-Capillary 194 (*)    All other components within normal limits  GLUCOSE, CAPILLARY - Abnormal; Notable for the following components:   Glucose-Capillary 175 (*)    All other components within normal limits  GLUCOSE, CAPILLARY - Abnormal; Notable for the following components:   Glucose-Capillary 152 (*)    All other components within normal limits  BASIC METABOLIC PANEL WITH GFR - Abnormal; Notable for the following components:   Glucose, Bld 145 (*)     BUN 31 (*)    Creatinine, Ser 2.14 (*)    Calcium  7.7 (*)    GFR, Estimated 25 (*)    All other components within normal limits  GLUCOSE, CAPILLARY - Abnormal; Notable for the following components:   Glucose-Capillary 120 (*)    All other components within normal limits  GLUCOSE, CAPILLARY - Abnormal; Notable for the following components:   Glucose-Capillary 127 (*)    All other components within normal limits  CBC - Abnormal; Notable for the following components:   WBC 13.3 (*)    RBC 3.84 (*)    Hemoglobin 9.8 (*)    HCT 31.8 (*)    MCH 25.5 (*)    RDW 19.1 (*)    All other components within normal limits  BASIC METABOLIC PANEL WITH GFR - Abnormal; Notable for the following components:   Chloride 97 (*)    Glucose, Bld 106 (*)    BUN 35 (*)    Creatinine, Ser 2.25 (*)    Calcium  7.6 (*)    GFR, Estimated 23 (*)    All other components within normal limits  GLUCOSE, CAPILLARY - Abnormal; Notable for the following components:   Glucose-Capillary 168 (*)    All other components within normal limits  GLUCOSE, CAPILLARY - Abnormal; Notable for the following components:   Glucose-Capillary 157 (*)    All other components within normal limits  GLUCOSE, CAPILLARY - Abnormal; Notable for the following components:   Glucose-Capillary 147 (*)    All other components within normal limits  GLUCOSE, CAPILLARY - Abnormal; Notable for the following components:   Glucose-Capillary 105 (*)    All other components within normal limits  GLUCOSE, CAPILLARY - Abnormal; Notable for the following components:   Glucose-Capillary 170 (*)    All other components within normal limits  IRON  AND TIBC - Abnormal; Notable for the following components:   Iron  24 (*)    TIBC  209 (*)    All other components within normal limits  RETICULOCYTES - Abnormal; Notable for the following components:   Immature Retic Fract 21.1 (*)    All other components within normal limits  GLUCOSE, CAPILLARY -  Abnormal; Notable for the following components:   Glucose-Capillary 164 (*)    All other components within normal limits  GLUCOSE, CAPILLARY - Abnormal; Notable for the following components:   Glucose-Capillary 195 (*)    All other components within normal limits  CBC - Abnormal; Notable for the following components:   WBC 12.2 (*)    RBC 3.73 (*)    Hemoglobin 9.5 (*)    HCT 30.6 (*)    MCH 25.5 (*)    RDW 18.4 (*)    All other components within normal limits  HEPARIN  LEVEL (UNFRACTIONATED) - Abnormal; Notable for the following components:   Heparin  Unfractionated <0.10 (*)    All other components within normal limits  GLUCOSE, CAPILLARY - Abnormal; Notable for the following components:   Glucose-Capillary 187 (*)    All other components within normal limits  GLUCOSE, CAPILLARY - Abnormal; Notable for the following components:   Glucose-Capillary 156 (*)    All other components within normal limits  GLUCOSE, CAPILLARY - Abnormal; Notable for the following components:   Glucose-Capillary 100 (*)    All other components within normal limits  BASIC METABOLIC PANEL WITH GFR - Abnormal; Notable for the following components:   Sodium 133 (*)    Chloride 94 (*)    BUN 40 (*)    Creatinine, Ser 2.52 (*)    Calcium  7.4 (*)    GFR, Estimated 20 (*)    All other components within normal limits  GLUCOSE, CAPILLARY - Abnormal; Notable for the following components:   Glucose-Capillary 101 (*)    All other components within normal limits  GLUCOSE, CAPILLARY - Abnormal; Notable for the following components:   Glucose-Capillary 165 (*)    All other components within normal limits  GLUCOSE, CAPILLARY - Abnormal; Notable for the following components:   Glucose-Capillary 152 (*)    All other components within normal limits  BASIC METABOLIC PANEL WITH GFR - Abnormal; Notable for the following components:   Sodium 134 (*)    Chloride 94 (*)    Glucose, Bld 126 (*)    BUN 41 (*)     Creatinine, Ser 2.70 (*)    Calcium  7.1 (*)    GFR, Estimated 19 (*)    All other components within normal limits  GLUCOSE, CAPILLARY - Abnormal; Notable for the following components:   Glucose-Capillary 146 (*)    All other components within normal limits  GLUCOSE, CAPILLARY - Abnormal; Notable for the following components:   Glucose-Capillary 147 (*)    All other components within normal limits  GLUCOSE, CAPILLARY - Abnormal; Notable for the following components:   Glucose-Capillary 129 (*)    All other components within normal limits  GLUCOSE, CAPILLARY - Abnormal; Notable for the following components:   Glucose-Capillary 189 (*)    All other components within normal limits  GLUCOSE, CAPILLARY - Abnormal; Notable for the following components:   Glucose-Capillary 139 (*)    All other components within normal limits  BASIC METABOLIC PANEL WITH GFR - Abnormal; Notable for the following components:   Chloride 95 (*)    Glucose, Bld 141 (*)    BUN 40 (*)    Creatinine, Ser 2.62 (*)    Calcium  7.1 (*)  GFR, Estimated 19 (*)    All other components within normal limits  GLUCOSE, CAPILLARY - Abnormal; Notable for the following components:   Glucose-Capillary 164 (*)    All other components within normal limits  GLUCOSE, CAPILLARY - Abnormal; Notable for the following components:   Glucose-Capillary 140 (*)    All other components within normal limits  GLUCOSE, CAPILLARY - Abnormal; Notable for the following components:   Glucose-Capillary 198 (*)    All other components within normal limits  GLUCOSE, CAPILLARY - Abnormal; Notable for the following components:   Glucose-Capillary 173 (*)    All other components within normal limits  BASIC METABOLIC PANEL WITH GFR - Abnormal; Notable for the following components:   Glucose, Bld 122 (*)    BUN 38 (*)    Creatinine, Ser 2.21 (*)    Calcium  7.3 (*)    GFR, Estimated 24 (*)    All other components within normal limits  GLUCOSE,  CAPILLARY - Abnormal; Notable for the following components:   Glucose-Capillary 143 (*)    All other components within normal limits  D-DIMER, QUANTITATIVE - Abnormal; Notable for the following components:   D-Dimer, Quant 2.07 (*)    All other components within normal limits  CBC - Abnormal; Notable for the following components:   WBC 16.8 (*)    Hemoglobin 10.8 (*)    HCT 34.9 (*)    MCH 25.2 (*)    RDW 18.6 (*)    All other components within normal limits  BRAIN NATRIURETIC PEPTIDE - Abnormal; Notable for the following components:   B Natriuretic Peptide 789.9 (*)    All other components within normal limits  BLOOD GAS, ARTERIAL - Abnormal; Notable for the following components:   pCO2 arterial 50 (*)    pO2, Arterial 372 (*)    Bicarbonate 28.2 (*)    All other components within normal limits  HEPATIC FUNCTION PANEL - Abnormal; Notable for the following components:   Albumin  2.5 (*)    All other components within normal limits  GLUCOSE, CAPILLARY - Abnormal; Notable for the following components:   Glucose-Capillary 127 (*)    All other components within normal limits  GLUCOSE, CAPILLARY - Abnormal; Notable for the following components:   Glucose-Capillary 152 (*)    All other components within normal limits  BASIC METABOLIC PANEL WITH GFR - Abnormal; Notable for the following components:   Chloride 96 (*)    Glucose, Bld 141 (*)    BUN 41 (*)    Creatinine, Ser 2.29 (*)    Calcium  7.2 (*)    GFR, Estimated 23 (*)    All other components within normal limits  GLUCOSE, CAPILLARY - Abnormal; Notable for the following components:   Glucose-Capillary 153 (*)    All other components within normal limits  GLUCOSE, CAPILLARY - Abnormal; Notable for the following components:   Glucose-Capillary 154 (*)    All other components within normal limits  GLUCOSE, CAPILLARY - Abnormal; Notable for the following components:   Glucose-Capillary 132 (*)    All other components within  normal limits  GLUCOSE, CAPILLARY - Abnormal; Notable for the following components:   Glucose-Capillary 147 (*)    All other components within normal limits  GLUCOSE, CAPILLARY - Abnormal; Notable for the following components:   Glucose-Capillary 141 (*)    All other components within normal limits  GLUCOSE, CAPILLARY - Abnormal; Notable for the following components:   Glucose-Capillary 152 (*)    All other  components within normal limits  GLUCOSE, CAPILLARY - Abnormal; Notable for the following components:   Glucose-Capillary 139 (*)    All other components within normal limits  GLUCOSE, CAPILLARY - Abnormal; Notable for the following components:   Glucose-Capillary 114 (*)    All other components within normal limits  BASIC METABOLIC PANEL WITH GFR - Abnormal; Notable for the following components:   Potassium 3.4 (*)    Chloride 95 (*)    Glucose, Bld 180 (*)    BUN 44 (*)    Creatinine, Ser 2.10 (*)    Calcium  6.9 (*)    GFR, Estimated 25 (*)    All other components within normal limits  GLUCOSE, CAPILLARY - Abnormal; Notable for the following components:   Glucose-Capillary 167 (*)    All other components within normal limits  GLUCOSE, CAPILLARY - Abnormal; Notable for the following components:   Glucose-Capillary 198 (*)    All other components within normal limits  GLUCOSE, CAPILLARY - Abnormal; Notable for the following components:   Glucose-Capillary 148 (*)    All other components within normal limits  GLUCOSE, CAPILLARY - Abnormal; Notable for the following components:   Glucose-Capillary 119 (*)    All other components within normal limits  POCT I-STAT 7, (LYTES, BLD GAS, ICA,H+H) - Abnormal; Notable for the following components:   pO2, Arterial 361 (*)    Calcium , Ion 1.02 (*)    HCT 35.0 (*)    Hemoglobin 11.9 (*)    All other components within normal limits  POCT I-STAT EG7 - Abnormal; Notable for the following components:   pO2, Ven 31 (*)    Calcium , Ion  1.01 (*)    HCT 35.0 (*)    Hemoglobin 11.9 (*)    All other components within normal limits  POCT I-STAT EG7 - Abnormal; Notable for the following components:   pO2, Ven 31 (*)    Calcium , Ion 1.05 (*)    All other components within normal limits  POCT I-STAT 7, (LYTES, BLD GAS, ICA,H+H) - Abnormal; Notable for the following components:   pO2, Arterial 306 (*)    Calcium , Ion 1.04 (*)    HCT 32.0 (*)    Hemoglobin 10.9 (*)    All other components within normal limits  TROPONIN I (HIGH SENSITIVITY) - Abnormal; Notable for the following components:   Troponin I (High Sensitivity) 168 (*)    All other components within normal limits  TROPONIN I (HIGH SENSITIVITY) - Abnormal; Notable for the following components:   Troponin I (High Sensitivity) 5,743 (*)    All other components within normal limits  TROPONIN I (HIGH SENSITIVITY) - Abnormal; Notable for the following components:   Troponin I (High Sensitivity) >24,000 (*)    All other components within normal limits  TROPONIN I (HIGH SENSITIVITY) - Abnormal; Notable for the following components:   Troponin I (High Sensitivity) 1,930 (*)    All other components within normal limits  TROPONIN I (HIGH SENSITIVITY) - Abnormal; Notable for the following components:   Troponin I (High Sensitivity) 1,799 (*)    All other components within normal limits  RESP PANEL BY RT-PCR (RSV, FLU A&B, COVID)  RVPGX2  MRSA NEXT GEN BY PCR, NASAL  MRSA NEXT GEN BY PCR, NASAL  MAGNESIUM   PROCALCITONIN  HEPARIN  LEVEL (UNFRACTIONATED)  MAGNESIUM   VITAMIN B12  FOLATE  FERRITIN  GLUCOSE, CAPILLARY  BASIC METABOLIC PANEL WITH GFR   ____________________________________________  EKG   EKG Interpretation Date/Time:  Sunday September 06 2023 14:25:07 EDT Ventricular Rate:  103 PR Interval:  217 QRS Duration:  101 QT Interval:  380 QTC Calculation: 498 R Axis:   95  Text Interpretation: Sinus tachycardia Ventricular trigeminy Prolonged PR interval  Right axis deviation Anteroseptal infarct, old Borderline ST depression, anterolateral leads Abnormal T, consider ischemia, diffuse leads Confirmed by Abby Hocking [id number]) on 09/06/2023 2:34:43 PM        ____________________________________________   PROCEDURES  Procedure(s) performed:   Procedures  CRITICAL CARE Performed by: Roberts Ching Total critical care time: 35 minutes Critical care time was exclusive of separately billable procedures and treating other patients. Critical care was necessary to treat or prevent imminent or life-threatening deterioration. Critical care was time spent personally by me on the following activities: development of treatment plan with patient and/or surrogate as well as nursing, discussions with consultants, evaluation of patient's response to treatment, examination of patient, obtaining history from patient or surrogate, ordering and performing treatments and interventions, ordering and review of laboratory studies, ordering and review of radiographic studies, pulse oximetry and re-evaluation of patient's condition.  Abby Hocking, MD Emergency Medicine  ____________________________________________   INITIAL IMPRESSION / ASSESSMENT AND PLAN / ED COURSE  Pertinent labs & imaging results that were available during my care of the patient were reviewed by me and considered in my medical decision making (see chart for details).   This patient is Presenting for Evaluation of SOB, which does require a range of treatment options, and is a complaint that involves a high risk of morbidity and mortality.  The Differential Diagnoses includes but is not exclusive to acute coronary syndrome, aortic dissection, pulmonary embolism, cardiac tamponade, community-acquired pneumonia, pericarditis, musculoskeletal chest wall pain, etc.   Critical Interventions-    Medications  acetaminophen  (TYLENOL ) tablet 650 mg ( Oral MAR Unhold 09/07/23 1903)    Or   acetaminophen  (TYLENOL ) suppository 650 mg ( Rectal MAR Unhold 09/07/23 1903)  melatonin tablet 3 mg ( Oral MAR Unhold 09/07/23 1903)  ondansetron  (ZOFRAN ) injection 4 mg ( Intravenous MAR Unhold 09/07/23 1903)  atorvastatin  (LIPITOR ) tablet 80 mg (80 mg Oral Given 09/15/23 0852)  ferrous sulfate  tablet 325 mg (325 mg Oral Given 09/15/23 1630)  insulin  glargine-yfgn (SEMGLEE ) injection 15 Units (15 Units Subcutaneous Given 09/15/23 2119)  perflutren  lipid microspheres (DEFINITY ) IV suspension (3 mLs Intravenous Given 09/07/23 1116)  ipratropium-albuterol  (DUONEB) 0.5-2.5 (3) MG/3ML nebulizer solution 3 mL ( Nebulization MAR Unhold 09/07/23 1903)  nitroGLYCERIN  100 mcg/mL intra-arterial injection (has no administration in time range)  sodium chloride  flush (NS) 0.9 % injection 3 mL (3 mLs Intravenous Given 09/15/23 2112)  sodium chloride  flush (NS) 0.9 % injection 3 mL (has no administration in time range)  0.9 %  sodium chloride  infusion (has no administration in time range)  Chlorhexidine  Gluconate Cloth 2 % PADS 6 each (6 each Topical Given 09/15/23 0900)  aspirin  chewable tablet 81 mg (81 mg Oral Given 09/15/23 0852)  clopidogrel  (PLAVIX ) tablet 75 mg (75 mg Oral Given 09/15/23 0852)  heparin  ADULT infusion 100 units/mL (25000 units/250mL) (0 Units/hr Intravenous Stopped 09/09/23 2339)  sodium chloride  flush (NS) 0.9 % injection 10-40 mL (10 mLs Intracatheter Given 09/15/23 2111)  sodium chloride  flush (NS) 0.9 % injection 10-40 mL (has no administration in time range)  metoprolol  succinate (TOPROL -XL) 24 hr tablet 25 mg (25 mg Oral Given 09/15/23 0852)  ipratropium-albuterol  (DUONEB) 0.5-2.5 (3) MG/3ML nebulizer solution 3 mL (3 mLs Nebulization Given 09/15/23 2348)  polyethylene glycol (MIRALAX  / GLYCOLAX )  packet 17 g (17 g Oral Patient Refused/Not Given 09/15/23 0853)  senna-docusate (Senokot-S) tablet 1 tablet (1 tablet Oral Not Given 09/15/23 2111)  hydrALAZINE  (APRESOLINE ) tablet 25 mg (25 mg Oral  Given 09/16/23 0615)  Oral care mouth rinse (has no administration in time range)  insulin  aspart (novoLOG ) injection 0-15 Units ( Subcutaneous Not Given 09/16/23 0615)  guaiFENesin  (MUCINEX ) 12 hr tablet 600 mg (600 mg Oral Given 09/12/23 1659)  labetalol  (NORMODYNE ) injection 20 mg (has no administration in time range)  feeding supplement (ENSURE ENLIVE / ENSURE PLUS) liquid 237 mL (237 mLs Oral Given 09/15/23 1454)  furosemide  (LASIX ) tablet 80 mg (80 mg Oral Given 09/15/23 1200)  isosorbide  dinitrate (ISORDIL ) tablet 20 mg (20 mg Oral Given 09/15/23 2111)  sodium chloride  0.9 % bolus 500 mL (0 mLs Intravenous Stopped 09/06/23 1755)  ipratropium-albuterol  (DUONEB) 0.5-2.5 (3) MG/3ML nebulizer solution 3 mL (3 mLs Nebulization Given 09/06/23 1442)  iohexol  (OMNIPAQUE ) 350 MG/ML injection 75 mL (75 mLs Intravenous Contrast Given 09/06/23 1836)  aspirin  chewable tablet 324 mg (324 mg Oral Given 09/06/23 2040)  heparin  bolus via infusion 4,000 Units (4,000 Units Intravenous Bolus from Bag 09/06/23 2114)  aspirin  chewable tablet 81 mg (81 mg Oral Given 09/07/23 1131)  clopidogrel  (PLAVIX ) tablet 300 mg (300 mg Oral Given 09/07/23 1953)  furosemide  (LASIX ) 160 mg in dextrose  5 % 50 mL IVPB (0 mg Intravenous Stopped 09/07/23 2146)  metolazone  (ZAROXOLYN ) tablet 5 mg (5 mg Oral Given 09/08/23 0958)  furosemide  (LASIX ) 160 mg in dextrose  5 % 50 mL IVPB (0 mg Intravenous Stopped 09/08/23 1830)  magnesium  sulfate IVPB 2 g 50 mL (0 g Intravenous Stopped 09/08/23 1722)  potassium chloride  SA (KLOR-CON  M) CR tablet 20 mEq (20 mEq Oral Given 09/10/23 1442)  iron  sucrose (VENOFER ) 500 mg in sodium chloride  0.9 % 250 mL IVPB ( Intravenous Infusion Verify 09/11/23 1537)  furosemide  (LASIX ) injection 60 mg (60 mg Intravenous Given 09/11/23 1653)  furosemide  (LASIX ) injection 40 mg (40 mg Intravenous Given 09/13/23 0536)  morphine  (PF) 2 MG/ML injection 2 mg (2 mg Intravenous Given 09/13/23 0536)  potassium chloride  SA (KLOR-CON   M) CR tablet 40 mEq (40 mEq Oral Given 09/15/23 1453)    Reassessment after intervention: Symptoms improved.    I did obtain Additional Historical Information from EMS.   Clinical Laboratory Tests Ordered, included troponin pending. Creatinine 1.50.   Radiologic Tests Ordered, included CXR. I independently interpreted the images and agree with radiology interpretation.   Cardiac Monitor Tracing which shows sinus tachycardia.    Social Determinants of Health Risk patient is not an active smoker.   Medical Decision Making: Summary:  Patient presents to the emergency department for evaluation of SOB. New O2 requirement with some mild wheezing on exam. Plan for CXR, labs, and reassess. No hypotension here. Doubt sepsis clinically but will follow labs.   Reevaluation with update and discussion with patient. Care transferred to Dr. Monnie Anthony pending labs and admit.   Patient's presentation is most consistent with acute presentation with potential threat to life or bodily function.   Disposition: admit  ____________________________________________  FINAL CLINICAL IMPRESSION(S) / ED DIAGNOSES  Final diagnoses:  NSTEMI (non-ST elevated myocardial infarction) [facility])    Note:  This document was prepared using Dragon voice recognition software and may include unintentional dictation errors.  Abby Hocking, MD, [facility] Emergency Medicine    Rhema Boyett, Shereen Dike, MD 09/16/23 [phone]

## 2023-09-06 NOTE — ED Notes (Signed)
 CCMD called and notified

## 2023-09-07 ENCOUNTER — Other Ambulatory Visit: Payer: Self-pay

## 2023-09-07 ENCOUNTER — Encounter (HOSPITAL_COMMUNITY): Payer: Self-pay | Admitting: Internal Medicine

## 2023-09-07 ENCOUNTER — Telehealth (HOSPITAL_COMMUNITY): Payer: Self-pay | Admitting: Pharmacy Technician

## 2023-09-07 ENCOUNTER — Other Ambulatory Visit (HOSPITAL_COMMUNITY): Payer: Self-pay

## 2023-09-07 ENCOUNTER — Encounter (HOSPITAL_COMMUNITY)
Admission: EM | Disposition: A | Discharge status: Skilled Nursing Facility | Payer: Self-pay | Source: Skilled Nursing Facility | Attending: Internal Medicine

## 2023-09-07 ENCOUNTER — Inpatient Hospital Stay (HOSPITAL_COMMUNITY)

## 2023-09-07 DIAGNOSIS — I214 Non-ST elevation (NSTEMI) myocardial infarction: Secondary | ICD-10-CM | POA: Diagnosis not present

## 2023-09-07 DIAGNOSIS — R7989 Other specified abnormal findings of blood chemistry: Secondary | ICD-10-CM

## 2023-09-07 DIAGNOSIS — I2510 Atherosclerotic heart disease of native coronary artery without angina pectoris: Secondary | ICD-10-CM | POA: Diagnosis not present

## 2023-09-07 DIAGNOSIS — D72829 Elevated white blood cell count, unspecified: Secondary | ICD-10-CM | POA: Diagnosis present

## 2023-09-07 DIAGNOSIS — I5032 Chronic diastolic (congestive) heart failure: Secondary | ICD-10-CM | POA: Insufficient documentation

## 2023-09-07 DIAGNOSIS — E1169 Type 2 diabetes mellitus with other specified complication: Secondary | ICD-10-CM | POA: Insufficient documentation

## 2023-09-07 DIAGNOSIS — D509 Iron deficiency anemia, unspecified: Secondary | ICD-10-CM | POA: Diagnosis present

## 2023-09-07 DIAGNOSIS — I10 Essential (primary) hypertension: Secondary | ICD-10-CM | POA: Diagnosis not present

## 2023-09-07 DIAGNOSIS — I509 Heart failure, unspecified: Secondary | ICD-10-CM

## 2023-09-07 DIAGNOSIS — I5043 Acute on chronic combined systolic (congestive) and diastolic (congestive) heart failure: Secondary | ICD-10-CM | POA: Diagnosis not present

## 2023-09-07 DIAGNOSIS — E785 Hyperlipidemia, unspecified: Secondary | ICD-10-CM | POA: Insufficient documentation

## 2023-09-07 DIAGNOSIS — E119 Type 2 diabetes mellitus without complications: Secondary | ICD-10-CM

## 2023-09-07 HISTORY — PX: RIGHT/LEFT HEART CATH AND CORONARY ANGIOGRAPHY: CATH118266

## 2023-09-07 LAB — COMPREHENSIVE METABOLIC PANEL WITH GFR
ALT: 37 U/L (ref 0–44)
AST: 112 U/L — ABNORMAL HIGH (ref 15–41)
Albumin: 2.6 g/dL — ABNORMAL LOW (ref 3.5–5.0)
Alkaline Phosphatase: 129 U/L — ABNORMAL HIGH (ref 38–126)
Anion gap: 12 (ref 5–15)
BUN: 24 mg/dL — ABNORMAL HIGH (ref 8–23)
CO2: 22 mmol/L (ref 22–32)
Calcium: 8 mg/dL — ABNORMAL LOW (ref 8.9–10.3)
Chloride: 106 mmol/L (ref 98–111)
Creatinine, Ser: 1.5 mg/dL — ABNORMAL HIGH (ref 0.44–1.00)
GFR, Estimated: 38 mL/min — ABNORMAL LOW (ref >60)
Glucose, Bld: 221 mg/dL — ABNORMAL HIGH (ref 70–99)
Potassium: 4.7 mmol/L (ref 3.5–5.1)
Sodium: 140 mmol/L (ref 135–145)
Total Bilirubin: 0.7 mg/dL (ref 0.0–1.2)
Total Protein: 6.6 g/dL (ref 6.5–8.1)

## 2023-09-07 LAB — POCT I-STAT EG7
Acid-Base Excess: 1 mmol/L (ref 0.0–2.0)
Acid-base deficit: 1 mmol/L (ref 0.0–2.0)
Bicarbonate: 26.1 mmol/L (ref 20.0–28.0)
Bicarbonate: 27.6 mmol/L (ref 20.0–28.0)
Calcium, Ion: 1.01 mmol/L — ABNORMAL LOW (ref 1.15–1.40)
Calcium, Ion: 1.05 mmol/L — ABNORMAL LOW (ref 1.15–1.40)
HCT: 35 % — ABNORMAL LOW (ref 36.0–46.0)
HCT: 36 % (ref 36.0–46.0)
Hemoglobin: 11.9 g/dL — ABNORMAL LOW (ref 12.0–15.0)
Hemoglobin: 12.2 g/dL (ref 12.0–15.0)
O2 Saturation: 52 %
O2 Saturation: 55 %
Potassium: 4.5 mmol/L (ref 3.5–5.1)
Potassium: 4.7 mmol/L (ref 3.5–5.1)
Sodium: 140 mmol/L (ref 135–145)
Sodium: 141 mmol/L (ref 135–145)
TCO2: 28 mmol/L (ref 22–32)
TCO2: 29 mmol/L (ref 22–32)
pCO2, Ven: 51.6 mmHg (ref 44–60)
pCO2, Ven: 52.4 mmHg (ref 44–60)
pH, Ven: 7.312 (ref 7.25–7.43)
pH, Ven: 7.329 (ref 7.25–7.43)
pO2, Ven: 31 mmHg — CL (ref 32–45)
pO2, Ven: 31 mmHg — CL (ref 32–45)

## 2023-09-07 LAB — CBC
HCT: 37.8 % (ref 36.0–46.0)
Hemoglobin: 11.2 g/dL — ABNORMAL LOW (ref 12.0–15.0)
MCH: 25.3 pg — ABNORMAL LOW (ref 26.0–34.0)
MCHC: 29.6 g/dL — ABNORMAL LOW (ref 30.0–36.0)
MCV: 85.3 fL (ref 80.0–100.0)
Platelets: 224 10*3/uL (ref 150–400)
RBC: 4.43 MIL/uL (ref 3.87–5.11)
RDW: 19.5 % — ABNORMAL HIGH (ref 11.5–15.5)
WBC: 15.2 10*3/uL — ABNORMAL HIGH (ref 4.0–10.5)
nRBC: 0 % (ref 0.0–0.2)

## 2023-09-07 LAB — URINALYSIS, COMPLETE (UACMP) WITH MICROSCOPIC
Bilirubin Urine: NEGATIVE
Glucose, UA: NEGATIVE mg/dL
Ketones, ur: NEGATIVE mg/dL
Nitrite: NEGATIVE
Protein, ur: 100 mg/dL — AB
Specific Gravity, Urine: 1.034 — ABNORMAL HIGH (ref 1.005–1.030)
WBC, UA: >50 WBC/hpf (ref 0–5)
pH: 8 (ref 5.0–8.0)

## 2023-09-07 LAB — POCT I-STAT 7, (LYTES, BLD GAS, ICA,H+H)
Acid-base deficit: 2 mmol/L (ref 0.0–2.0)
Acid-base deficit: 1 mmol/L (ref 0.0–2.0)
Bicarbonate: 23.7 mmol/L (ref 20.0–28.0)
Bicarbonate: 24.8 mmol/L (ref 20.0–28.0)
Calcium, Ion: 1.02 mmol/L — ABNORMAL LOW (ref 1.15–1.40)
Calcium, Ion: 1.04 mmol/L — ABNORMAL LOW (ref 1.15–1.40)
HCT: 35 % — ABNORMAL LOW (ref 36.0–46.0)
HCT: 32 % — ABNORMAL LOW (ref 36.0–46.0)
Hemoglobin: 11.9 g/dL — ABNORMAL LOW (ref 12.0–15.0)
Hemoglobin: 10.9 g/dL — ABNORMAL LOW (ref 12.0–15.0)
O2 Saturation: 100 %
O2 Saturation: 100 %
Potassium: 4.6 mmol/L (ref 3.5–5.1)
Potassium: 4.6 mmol/L (ref 3.5–5.1)
Sodium: 142 mmol/L (ref 135–145)
Sodium: 140 mmol/L (ref 135–145)
TCO2: 25 mmol/L (ref 22–32)
TCO2: 26 mmol/L (ref 22–32)
pCO2 arterial: 42.1 mmHg (ref 32–48)
pCO2 arterial: 43.5 mmHg (ref 32–48)
pH, Arterial: 7.358 (ref 7.35–7.45)
pH, Arterial: 7.364 (ref 7.35–7.45)
pO2, Arterial: 361 mmHg — ABNORMAL HIGH (ref 83–108)
pO2, Arterial: 306 mmHg — ABNORMAL HIGH (ref 83–108)

## 2023-09-07 LAB — MRSA NEXT GEN BY PCR, NASAL
MRSA by PCR Next Gen: NOT DETECTED
MRSA by PCR Next Gen: NOT DETECTED

## 2023-09-07 LAB — GLUCOSE, CAPILLARY
Glucose-Capillary: 175 mg/dL — ABNORMAL HIGH (ref 70–99)
Glucose-Capillary: 187 mg/dL — ABNORMAL HIGH (ref 70–99)
Glucose-Capillary: 194 mg/dL — ABNORMAL HIGH (ref 70–99)
Glucose-Capillary: 195 mg/dL — ABNORMAL HIGH (ref 70–99)
Glucose-Capillary: 204 mg/dL — ABNORMAL HIGH (ref 70–99)
Glucose-Capillary: 205 mg/dL — ABNORMAL HIGH (ref 70–99)
Glucose-Capillary: 233 mg/dL — ABNORMAL HIGH (ref 70–99)

## 2023-09-07 LAB — PROCALCITONIN: Procalcitonin: 0.1 ng/mL

## 2023-09-07 LAB — LIPID PANEL
Cholesterol: 127 mg/dL (ref 0–200)
HDL: 40 mg/dL — ABNORMAL LOW (ref >40)
LDL Cholesterol: 61 mg/dL (ref 0–99)
Total CHOL/HDL Ratio: 3.2 ratio
Triglycerides: 132 mg/dL (ref <150)
VLDL: 26 mg/dL (ref 0–40)

## 2023-09-07 LAB — ECHOCARDIOGRAM COMPLETE
Height: 66 in
S' Lateral: 4.5 cm
Weight: 4003.55 [oz_av]

## 2023-09-07 LAB — HEMOGLOBIN A1C
Hgb A1c MFr Bld: 7.3 % — ABNORMAL HIGH (ref 4.8–5.6)
Mean Plasma Glucose: 162.81 mg/dL

## 2023-09-07 LAB — TROPONIN I (HIGH SENSITIVITY): Troponin I (High Sensitivity): >24000 ng/L (ref <18)

## 2023-09-07 LAB — HEPARIN LEVEL (UNFRACTIONATED)
Heparin Unfractionated: 0.27 [IU]/mL — ABNORMAL LOW (ref 0.30–0.70)
Heparin Unfractionated: 0.27 [IU]/mL — ABNORMAL LOW (ref 0.30–0.70)

## 2023-09-07 LAB — BRAIN NATRIURETIC PEPTIDE: B Natriuretic Peptide: 1077.5 pg/mL — ABNORMAL HIGH (ref 0.0–100.0)

## 2023-09-07 LAB — MAGNESIUM: Magnesium: 1.9 mg/dL (ref 1.7–2.4)

## 2023-09-07 SURGERY — RIGHT/LEFT HEART CATH AND CORONARY ANGIOGRAPHY
Anesthesia: LOCAL

## 2023-09-07 MED ORDER — INSULIN ASPART 100 UNIT/ML IJ SOLN
0.0000 [IU] | INTRAMUSCULAR | Status: DC
  Administered 2023-09-07: 5 [IU] via SUBCUTANEOUS
  Administered 2023-09-07 (×2): 3 [IU] via SUBCUTANEOUS
  Administered 2023-09-08: 2 [IU] via SUBCUTANEOUS
  Administered 2023-09-08 (×2): 3 [IU] via SUBCUTANEOUS
  Administered 2023-09-08: 2 [IU] via SUBCUTANEOUS
  Administered 2023-09-08 – 2023-09-09 (×6): 3 [IU] via SUBCUTANEOUS
  Administered 2023-09-10: 2 [IU] via SUBCUTANEOUS
  Administered 2023-09-10 (×2): 3 [IU] via SUBCUTANEOUS
  Administered 2023-09-11 (×3): 2 [IU] via SUBCUTANEOUS

## 2023-09-07 MED ORDER — HEPARIN SODIUM (PORCINE) 1000 UNIT/ML IJ SOLN
INTRAMUSCULAR | Status: DC | PRN
  Administered 2023-09-07: 5000 [IU] via INTRAVENOUS

## 2023-09-07 MED ORDER — SODIUM CHLORIDE 0.9% FLUSH
3.0000 mL | INTRAVENOUS | Status: DC | PRN
  Administered 2023-09-16 (×2): 3 mL via INTRAVENOUS

## 2023-09-07 MED ORDER — PERFLUTREN LIPID MICROSPHERE
1.0000 mL | INTRAVENOUS | Status: AC | PRN
  Administered 2023-09-07: 3 mL via INTRAVENOUS

## 2023-09-07 MED ORDER — FUROSEMIDE 10 MG/ML IJ SOLN
40.0000 mg | Freq: Three times a day (TID) | INTRAMUSCULAR | Status: DC
  Administered 2023-09-07: 40 mg via INTRAVENOUS
  Filled 2023-09-07: qty 4

## 2023-09-07 MED ORDER — HYDRALAZINE HCL 20 MG/ML IJ SOLN: 10.0000 mg | INTRAMUSCULAR | Status: DC | PRN

## 2023-09-07 MED ORDER — NITROGLYCERIN 1 MG/10 ML FOR IR/CATH LAB
INTRA_ARTERIAL | Status: DC | PRN
  Administered 2023-09-07: 200 ug via INTRA_ARTERIAL
  Administered 2023-09-07: 200 ug via INTRACORONARY

## 2023-09-07 MED ORDER — VERAPAMIL HCL 2.5 MG/ML IV SOLN
INTRAVENOUS | Status: DC | PRN
  Administered 2023-09-07: 10 mL via INTRA_ARTERIAL

## 2023-09-07 MED ORDER — SODIUM CHLORIDE 0.9 % IV SOLN: INTRAVENOUS | Status: DC

## 2023-09-07 MED ORDER — CLOPIDOGREL BISULFATE 75 MG PO TABS
75.0000 mg | ORAL_TABLET | Freq: Every day | ORAL | Status: DC
  Administered 2023-09-08 – 2023-09-16 (×9): 75 mg via ORAL
  Filled 2023-09-07 (×9): qty 1

## 2023-09-07 MED ORDER — SODIUM CHLORIDE 0.9 % IV SOLN: 250.0000 mL | INTRAVENOUS | Status: AC | PRN

## 2023-09-07 MED ORDER — VERAPAMIL HCL 2.5 MG/ML IV SOLN
INTRAVENOUS | Status: AC
  Filled 2023-09-07: qty 2

## 2023-09-07 MED ORDER — HEPARIN (PORCINE) 25000 UT/250ML-% IV SOLN
1500.0000 [IU]/h | INTRAVENOUS | Status: AC
  Administered 2023-09-07: 1400 [IU]/h via INTRAVENOUS
  Administered 2023-09-08 – 2023-09-09 (×2): 1500 [IU]/h via INTRAVENOUS
  Filled 2023-09-07 (×3): qty 250

## 2023-09-07 MED ORDER — CLOPIDOGREL BISULFATE 300 MG PO TABS
300.0000 mg | ORAL_TABLET | Freq: Once | ORAL | Status: AC
  Administered 2023-09-07: 300 mg via ORAL
  Filled 2023-09-07: qty 1

## 2023-09-07 MED ORDER — INSULIN GLARGINE-YFGN 100 UNIT/ML ~~LOC~~ SOLN
15.0000 [IU] | Freq: Two times a day (BID) | SUBCUTANEOUS | Status: DC
  Administered 2023-09-07 – 2023-09-16 (×18): 15 [IU] via SUBCUTANEOUS
  Filled 2023-09-07 (×22): qty 0.15

## 2023-09-07 MED ORDER — ASPIRIN 81 MG PO CHEW
81.0000 mg | CHEWABLE_TABLET | ORAL | Status: AC
  Administered 2023-09-07: 81 mg via ORAL
  Filled 2023-09-07: qty 1

## 2023-09-07 MED ORDER — NITROGLYCERIN 1 MG/10 ML FOR IR/CATH LAB
INTRA_ARTERIAL | Status: AC
  Filled 2023-09-07: qty 10

## 2023-09-07 MED ORDER — LIDOCAINE HCL (PF) 1 % IJ SOLN
INTRAMUSCULAR | Status: AC
  Filled 2023-09-07: qty 30

## 2023-09-07 MED ORDER — IOHEXOL 350 MG/ML SOLN
INTRAVENOUS | Status: DC | PRN
  Administered 2023-09-07: 50 mL via INTRA_ARTERIAL

## 2023-09-07 MED ORDER — FERROUS SULFATE 325 (65 FE) MG PO TABS
325.0000 mg | ORAL_TABLET | Freq: Two times a day (BID) | ORAL | Status: DC
  Administered 2023-09-08 – 2023-09-16 (×17): 325 mg via ORAL
  Filled 2023-09-07 (×19): qty 1

## 2023-09-07 MED ORDER — IPRATROPIUM-ALBUTEROL 0.5-2.5 (3) MG/3ML IN SOLN: 3.0000 mL | Freq: Once | RESPIRATORY_TRACT | Status: DC

## 2023-09-07 MED ORDER — LIDOCAINE HCL (PF) 1 % IJ SOLN
INTRAMUSCULAR | Status: DC | PRN
  Administered 2023-09-07 (×2): 2 mL

## 2023-09-07 MED ORDER — METOPROLOL SUCCINATE ER 50 MG PO TB24
50.0000 mg | ORAL_TABLET | Freq: Every day | ORAL | Status: DC
Start: 2023-09-07 — End: 2023-09-08
  Administered 2023-09-07: 50 mg via ORAL
  Filled 2023-09-07: qty 1

## 2023-09-07 MED ORDER — FUROSEMIDE 10 MG/ML IJ SOLN
160.0000 mg | Freq: Once | INTRAVENOUS | Status: AC
  Administered 2023-09-07: 160 mg via INTRAVENOUS
  Filled 2023-09-07: qty 16

## 2023-09-07 MED ORDER — HEPARIN (PORCINE) IN NACL 1000-0.9 UT/500ML-% IV SOLN
INTRAVENOUS | Status: DC | PRN
  Administered 2023-09-07: 1000 mL via INTRA_ARTERIAL

## 2023-09-07 MED ORDER — ASPIRIN 81 MG PO CHEW
81.0000 mg | CHEWABLE_TABLET | Freq: Every day | ORAL | Status: DC
  Administered 2023-09-08 – 2023-09-16 (×9): 81 mg via ORAL
  Filled 2023-09-07 (×9): qty 1

## 2023-09-07 MED ORDER — SODIUM CHLORIDE 0.9% FLUSH
3.0000 mL | Freq: Two times a day (BID) | INTRAVENOUS | Status: DC
Start: 2023-09-07 — End: 2023-09-16
  Administered 2023-09-07 – 2023-09-15 (×16): 3 mL via INTRAVENOUS

## 2023-09-07 MED ORDER — LABETALOL HCL 5 MG/ML IV SOLN: 10.0000 mg | INTRAVENOUS | Status: DC | PRN

## 2023-09-07 MED ORDER — ATORVASTATIN CALCIUM 80 MG PO TABS
80.0000 mg | ORAL_TABLET | Freq: Every day | ORAL | Status: DC
  Administered 2023-09-07 – 2023-09-16 (×10): 80 mg via ORAL
  Filled 2023-09-07 (×11): qty 1

## 2023-09-07 MED ORDER — CHLORHEXIDINE GLUCONATE CLOTH 2 % EX PADS
6.0000 | MEDICATED_PAD | Freq: Every day | CUTANEOUS | Status: DC
  Administered 2023-09-07 – 2023-09-16 (×11): 6 via TOPICAL

## 2023-09-07 MED ORDER — HEPARIN SODIUM (PORCINE) 1000 UNIT/ML IJ SOLN
INTRAMUSCULAR | Status: AC
  Filled 2023-09-07: qty 10

## 2023-09-07 SURGICAL SUPPLY — 16 items
CATH INFINITI AMBI 5FR TG (CATHETERS) IMPLANT
CATH SWAN GANZ 7F STRAIGHT (CATHETERS) IMPLANT
COVER PRB 48X5XTLSCP FOLD TPE (BAG) IMPLANT
DEVICE RAD COMP TR BAND LRG (VASCULAR PRODUCTS) IMPLANT
ELECT DEFIB PAD ADLT CADENCE (PAD) IMPLANT
GLIDESHEATH SLEND SS 6F .021 (SHEATH) IMPLANT
GLIDESHEATH SLENDER 7FR .021G (SHEATH) IMPLANT
GUIDEWIRE INQWIRE 1.5J.035X260 (WIRE) IMPLANT
INQWIRE 1.5J .035X260CM (WIRE) ×1 IMPLANT
PACK CARDIAC CATHETERIZATION (CUSTOM PROCEDURE TRAY) ×1 IMPLANT
SET ATX-X65L (MISCELLANEOUS) IMPLANT
SHEATH PINNACLE 7F 10CM (SHEATH) IMPLANT
WIRE EMERALD 3MM-J .025X260CM (WIRE) IMPLANT
WIRE HI TORQ VERSACORE-J 145CM (WIRE) IMPLANT
WIRE MICRO SET 5FR 12 (WIRE) IMPLANT
WIRE MICRO SET SILHO 5FR 7 (SHEATH) IMPLANT

## 2023-09-07 NOTE — H&P (View-Only) (Signed)
 Patient seen and examined. Reviewed Dr Henderson's assessment from this am.   Patient brought to hospital from SNF with acute onset SOB. Denies chest pain. Was hypoxic. Remote ischemic work up with last cath in 2010. She has multiple cardiac risk factors with IDDM, HTN , HLD, former smoker and PAD with bilateral LE amputee.  On evaluation she is SOB at rest. Active wheezing on exam No gallop or murmur. Bilateral LE amputee  Ecg shows NSR with frequent PVCs, new ST-T depression inferolateral leads.   Troponin 168>>5743 BNP 360 CXR with CM CT with bilateral pleural effusions. Early edema. SEVERE coronary and aortic calcifications.   Patient presents with acute CHF. Echo is pending. Also Ecg and enzyme evidence of NSTEMI.   Recommend IV diuresis.  Will check Echo Recommend right and left heart cath to define anatomy.  I don't think she would be a candidate for CABG   The procedure and risks were reviewed including but not limited to death, myocardial infarction, stroke, arrythmias, bleeding, transfusion, emergency surgery, dye allergy, or renal dysfunction. The patient voices understanding and is agreeable to proceed.

## 2023-09-07 NOTE — Telephone Encounter (Signed)
 Patient Product/process development scientist completed.    The patient is insured through [facility]. Patient has Medicare and is not eligible for a copay card, but may be able to apply for patient assistance or Medicare RX Payment Plan (Patient Must reach out to their plan, if eligible for payment plan), if available.    Ran test claim for Entresto 24-26 mg and the current 30 day co-pay is $0.00.  Ran test claim for Farxiga 10 mg and the current 30 day co-pay is $0.00.  Ran test claim for Jardiance 10 mg and the current 30 day co-pay is $0.00.    This test claim was processed through [facility]- copay amounts may vary at other pharmacies due to pharmacy/plan contracts, or as the patient moves through the different stages of their insurance plan.     Roland Earl, CPHT Pharmacy Technician III Certified Patient Advocate [facility] Patient Advocate Team Direct Number: [phone]  Fax: [phone]

## 2023-09-07 NOTE — Plan of Care (Signed)
  Problem: Education: Goal: Ability to describe self-care measures that may prevent or decrease complications (Diabetes Survival Skills Education) will improve Outcome: Progressing   Problem: Coping: Goal: Ability to adjust to condition or change in health will improve Outcome: Progressing   Problem: Fluid Volume: Goal: Ability to maintain a balanced intake and output will improve Outcome: Progressing   Problem: Nutritional: Goal: Maintenance of adequate nutrition will improve Outcome: Not Progressing   Problem: Skin Integrity: Goal: Risk for impaired skin integrity will decrease Outcome: Progressing   Problem: Tissue Perfusion: Goal: Adequacy of tissue perfusion will improve Outcome: Progressing

## 2023-09-07 NOTE — Progress Notes (Signed)
 Pre cath orders written per d/w Dr. [place], KVO IVF. Spoke to cath lab to put on add on board. Patient's insulin  dose has already been down-titrated to 15u BID (home dose 38u BID) so hold off on acute change this AM.

## 2023-09-07 NOTE — Progress Notes (Signed)
 PHARMACY - ANTICOAGULATION CONSULT NOTE  Pharmacy Consult for heparin  Indication: chest pain/ACS  Allergies  Allergen Reactions   Banana Swelling and Other (See Comments)    Facial swelling   Penicillins Itching, Swelling and Rash    Face swells and break out   Strawberry Extract Swelling and Other (See Comments)    Facial swelling    Patient Measurements: Height: 5\' 6"  (167.6 cm) Weight: 113.5 kg (250 lb 3.6 oz) IBW/kg (Calculated) : 59.3 HEPARIN  DW (KG): 85.9  Vital Signs: Temp: 98.1 F (36.7 C) (04/21 1125) Temp Source: Oral (04/21 1125) BP: 110/92 (04/21 1125) Pulse Rate: 88 (04/21 1300)  Labs: Recent Labs    09/06/23 1442 09/06/23 1800 09/07/23 0212 09/07/23 0856 09/07/23 1206  HGB 11.6*  --  11.2*  --   --   HCT 39.9  --  37.8  --   --   PLT 271  --  224  --   --   HEPARINUNFRC  --   --  0.27*  --  0.27*  CREATININE 1.23*  --  1.50*  --   --   TROPONINIHS 168* 5,743*  --  >24,000*  --     Estimated Creatinine Clearance: 45.9 mL/min (A) (by C-G formula based on SCr of 1.5 mg/dL (H)).   Medical History: Past Medical History:  Diagnosis Date   Atypical chest pain    a. non obstructive by cath in 2008 and 2010;  b. 02/2012 Myoview : non-ischemic, EF 57%   Cellulitis    a. left foot-> s/p L BKA   Chronic kidney disease    Constipation    CVA (cerebral infarction)    a.  Small right parietal noted incidentally 04/2007;  b. right sided embolic CVA 05/2012;  c. 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 near-syncopal episode in November 2008;  b. 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   Tobacco abuse    Vertigo     Assessment: 67 YOF presenting with concern for ACS,  not on anticoagulation PTA  -heparin  level = 0.27 and below goal on 1300 units/hr, CBC stable -plans for cath today  Goal of Therapy:  Heparin  level 0.3-0.7 units/ml Monitor platelets by anticoagulation protocol: Yes   Plan:  -No changes in heparin  now since close to goal and going to cath soon -Will follow post procedure  Baxter Limber, PharmD Clinical Pharmacist **Pharmacist phone directory can now be found on [email or web address] ([email or web address]).  Listed under [facility].

## 2023-09-07 NOTE — Progress Notes (Signed)
 Patient seen and examined. Reviewed Dr Henderson's assessment from this am.   Patient brought to hospital from SNF with acute onset SOB. Denies chest pain. Was hypoxic. Remote ischemic work up with last cath in 2010. She has multiple cardiac risk factors with IDDM, HTN , HLD, former smoker and PAD with bilateral LE amputee.  On evaluation she is SOB at rest. Active wheezing on exam No gallop or murmur. Bilateral LE amputee  Ecg shows NSR with frequent PVCs, new ST-T depression inferolateral leads.   Troponin 168>>5743 BNP 360 CXR with CM CT with bilateral pleural effusions. Early edema. SEVERE coronary and aortic calcifications.   Patient presents with acute CHF. Echo is pending. Also Ecg and enzyme evidence of NSTEMI.   Recommend IV diuresis.  Will check Echo Recommend right and left heart cath to define anatomy.  I don't think she would be a candidate for CABG   The procedure and risks were reviewed including but not limited to death, myocardial infarction, stroke, arrythmias, bleeding, transfusion, emergency surgery, dye allergy, or renal dysfunction. The patient voices understanding and is agreeable to proceed.

## 2023-09-07 NOTE — Progress Notes (Signed)
 Echocardiogram 2D Echocardiogram has been performed.  Emmaline Haring Keenen Roessner RDCS 09/07/2023, 11:16 AM

## 2023-09-07 NOTE — Consult Note (Signed)
 Cardiology Consultation   Patient ID: [name] MRN: [id number]; DOB: Aug 07, 1955  Admit date: 09/06/2023 Date of Consult: 09/07/2023  PCP:  [name] Sit, MD   [facility] Providers Cardiologist:  None        Patient Profile:   [name] is a 68 y.o. female with a history of non-obstructive coronary artery disease, prior cerebrovascular accident, heart failure with recovered ejection fraction, hypertension, insulin -dependent diabetes, stage IIIa chronic kidney disease, hypercholesteremia, prior cigarette use, and status post recent bilateral below knee amputation (gangrene) who is being seen 09/07/2023 for the evaluation of elevated HS-troponin at the request of Dr. Brock [name].  History of Present Illness:   [name] brought in by emergency medical services from [facility] due to concern for hypoxia noted.  Patient was placed on supplemental oxygen and sent to our facility for further evaluation.  Here, patient denies having any issues.  She denies having chest pain, shortness of breath, swelling in her legs, shortness of breath with lying flat, or waking up due to shortness of breath.  In the emergency department, HS-troponin was checked for unclear reasons but was elevated (168 ng/L to 5,743 ng/L).  ECGs was without any signs of ischemia/infarct.  Chest CT angiography performed ruled out pulmonary embolism.  Patient was provided high-dose aspirin  and started on a heparin  drip.  Cardiology was consulted to further evaluate for possible NSTEMI-ACS.  Of note, based on chart documentation, patient reportedly had coronary angiography in 2008 and 2010 with reported non-obstructive coronary artery disease.     Past Medical History:  Diagnosis Date   Atypical chest pain    a. non obstructive by cath in 2008 and 2010;  b. 02/2012 Myoview : non-ischemic, EF 57%   Cellulitis    a. left foot-> s/p L BKA   Chronic kidney disease     Constipation    CVA (cerebral infarction)    a.  Small right parietal noted incidentally 04/2007;  b. right sided embolic CVA 05/2012;  c. TEE 2/14:  LVH, EF 55-60%, mild LAE, no LAA clot, no PFO, no R->L shunt by echo contrast, oscillating density on AV likely Lambl's Excressence    Diabetes mellitus, type II (HCC)    Diabetic retinopathy (HCC)    NPDR w/edema OU   Diaphragmatic hernia without mention of obstruction or gangrene    Dry skin    Esophageal reflux    HTN (hypertension)    Hx of BKA (HCC)    Bilateral   Hyperlipidemia    Irritable bowel syndrome    Morbid obesity (HCC)    Obesity, unspecified    Pain in shoulder 06/28/2015   Peripheral neuropathy    Stroke [facility])    2013-'no residual'   Syncope and collapse    a. near-syncopal episode in November 2008;  b. 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   Tobacco abuse    Vertigo     Past Surgical History:  Procedure Laterality Date   AMPUTATION  12/30/2011   Procedure: AMPUTATION RAY;  Surgeon: Amada Backer, MD;  Location: [facility] OR;  Service: Orthopedics;  Laterality: Left;  LEFT FIRST RAY AMPUTATION   AMPUTATION  01/13/2012   Procedure: AMPUTATION BELOW KNEE;  Surgeon: Amada Backer, MD;  Location: [facility] OR;  Service: Orthopedics;  Laterality: Left;   AMPUTATION  03/04/2012   Procedure: AMPUTATION BELOW KNEE;  Surgeon: Amada Backer, MD;  Location: [facility] OR;  Service: Orthopedics;  Laterality: Left;  Revision of Left Below Knee  Amputation   AMPUTATION Right 11/02/2013   Procedure: AMPUTATION BELOW KNEE;  Surgeon: Timothy Ford, MD;  Location: [facility] OR;  Service: Orthopedics;  Laterality: Right;  Right Below Knee Amputation   BIOPSY  03/29/2019   Procedure: BIOPSY;  Surgeon: Annis Kinder, DO;  Location: [facility] ENDOSCOPY;  Service: Gastroenterology;;  EGD and Colon   CARDIAC CATHETERIZATION     LAD 30%, circumflex 50%, OM 75%, RI 60% with small branch 80%, dominant RCA 60%, EF 45-50%   CHOLECYSTECTOMY     COLONOSCOPY     COLONOSCOPY WITH  PROPOFOL  N/A 03/29/2019   Procedure: COLONOSCOPY WITH PROPOFOL ;  Surgeon: Annis Kinder, DO;  Location: [facility] ENDOSCOPY;  Service: Gastroenterology;  Laterality: N/A;   ESOPHAGOGASTRODUODENOSCOPY (EGD) WITH PROPOFOL  N/A 03/29/2019   Procedure: ESOPHAGOGASTRODUODENOSCOPY (EGD) WITH PROPOFOL ;  Surgeon: Annis Kinder, DO;  Location: [facility] ENDOSCOPY;  Service: Gastroenterology;  Laterality: N/A;   EYE SURGERY Right    cataract   I & D EXTREMITY  12/23/2011   Procedure: IRRIGATION AND DEBRIDEMENT EXTREMITY;  Surgeon: Lorriane Rote, MD;  Location: [facility] OR;  Service: Orthopedics;  Laterality: Left;  Left Foot   I & D EXTREMITY  12/26/2011   Procedure: IRRIGATION AND DEBRIDEMENT EXTREMITY;  Surgeon: Amada Backer, MD;  Location: [facility] OR;  Service: Orthopedics;  Laterality: Left;  LEFT FOOT I&D WITH POSSIBLE WOUND VAC APPLICATION, POSSIBLE LEFT FIRST RAY AMPUTATION   I & D EXTREMITY Right 10/29/2013   Procedure: IRRIGATION AND DEBRIDEMENT EXTREMITY;  Surgeon: Loel Ring, MD;  Location: [facility] OR;  Service: Orthopedics;  Laterality: Right;   Invasive Electrophysiologic Study  5/09   followed by insertion of an implantable loop recorder. S/p removal    Multiple Toe Surgeries     POLYPECTOMY  03/29/2019   Procedure: POLYPECTOMY;  Surgeon: Annis Kinder, DO;  Location: [facility] ENDOSCOPY;  Service: Gastroenterology;;   TEE WITHOUT CARDIOVERSION N/A 07/07/2012   Procedure: TRANSESOPHAGEAL ECHOCARDIOGRAM (TEE);  Surgeon: Lenise Quince, MD;  Location: [facility] ENDOSCOPY;  Service: Cardiovascular;  Laterality: N/A;     Home Medications:  Prior to Admission medications   Medication Sig Start Date End Date Taking? Authorizing Provider  albuterol  (VENTOLIN  HFA) 108 (90 Base) MCG/ACT inhaler Inhale 2 puffs into the lungs in the morning and at bedtime.   Yes [provider]  amLODipine  (NORVASC ) 2.5 MG tablet Take 2.5 mg by mouth daily. 06/14/18  Yes [provider]  aspirin  EC 81 MG tablet Take 81 mg by  mouth daily.   Yes [provider]  atorvastatin  (LIPITOR ) 40 MG tablet Take 40 mg by mouth daily. 08/23/15  Yes [provider]  chlorhexidine  (PERIDEX ) 0.12 % solution Use as directed 5 mLs in the mouth or throat 2 (two) times daily.   Yes [provider]  Dulaglutide 3 MG/0.5ML SOAJ Inject 4.5 mg into the skin. Every Wednesday   Yes [provider]  ferrous sulfate  325 (65 FE) MG tablet Take 325 mg by mouth 2 (two) times daily.   Yes [provider]  gabapentin (NEURONTIN) 400 MG capsule Take 800 mg by mouth daily. 06/02/18  Yes [provider]  gabapentin (NEURONTIN) 600 MG tablet Take 600 mg by mouth in the morning.   Yes [provider]  insulin  lispro (HUMALOG) 100 UNIT/ML injection Inject 4-14 Units into the skin in the morning and at bedtime. 150-200=4 units,201-250 = 6 units, 251-300 = 8 units, 301-350= 10 units, 351-400= 10 units, 401-450=14 units, 451-500 =12 units >  500 = 14 units   Yes [provider]  LANTUS  100 UNIT/ML injection Inject 38 Units into the skin 2 (two) times daily. 08/23/15  Yes [provider]  Loperamide HCl (IMODIUM PO) Take 2 mLs by mouth every 8 (eight) hours as needed (diarrhea). 2mg /18ml   Yes [provider]  loratadine (CLARITIN) 10 MG tablet Take 10 mg by mouth daily.   Yes [provider]  magnesium  hydroxide (MILK OF MAGNESIA) 400 MG/5ML suspension Take 30 mLs by mouth daily as needed for mild constipation.   Yes [provider]  metFORMIN  (GLUCOPHAGE ) 500 MG tablet Take 500 mg by mouth daily. 07/13/23  Yes [provider]  metoprolol  succinate (TOPROL -XL) 50 MG 24 hr tablet Take 50 mg by mouth daily. 07/13/23  Yes [provider]  nystatin (MYCOSTATIN/NYSTOP) powder Apply 1 Application topically 3 (three) times daily. Apply to skin folds/ R side neck topically threes times a day for candidiasis. Apply to reddened areas on inner elbows and  abdominal fold/R side of neck   Yes [provider]  PAZEO 0.7 % SOLN Place 1 drop into both eyes daily.  05/27/18  Yes [provider]  polyethylene glycol (MIRALAX  / GLYCOLAX ) packet Take 17 g by mouth daily as needed for moderate constipation. Patient taking differently: Take 17 g by mouth every other day. 11/04/13  Yes Rizwan, Saima, MD  senna-docusate (SENOKOT-S) 8.6-50 MG tablet Take 1 tablet by mouth daily.   Yes [provider]  Sodium Fluoride 1.1 % PSTE Place 1 Application onto teeth at bedtime.   Yes [provider]  Tetrahydrozoline HCl (VISINE OP) Place 1 drop into both eyes at bedtime.   Yes [provider]  vitamin C  (ASCORBIC ACID) 500 MG tablet Take 500 mg by mouth daily.   Yes [provider]  atorvastatin  (LIPITOR ) 20 MG tablet Take 20 mg by mouth daily. Patient not taking: Reported on 09/06/2023 05/22/18   [provider]  carvedilol (COREG) 12.5 MG tablet Take 12.5 mg by mouth 2 (two) times daily.  Patient not taking: Reported on 09/06/2023 06/08/18   [provider]  cetirizine (ZYRTEC) 10 MG tablet Take 10 mg by mouth daily. Patient not taking: Reported on 09/06/2023    [provider]  Cholecalciferol (VITAMIN D) 50 MCG (2000 UT) tablet Take 2,000 Units by mouth daily. Patient not taking: Reported on 09/06/2023    [provider]  cyanocobalamin  1000 MCG tablet Take 1,000 mcg by mouth daily. Patient not taking: Reported on 09/06/2023    [provider]  docusate sodium  100 MG CAPS Take 100 mg by mouth 2 (two) times daily. Patient not taking: Reported on 09/06/2023 11/04/13   Rizwan, Saima, MD  hydrALAZINE  (APRESOLINE ) 25 MG tablet Take 75 mg by mouth 3 (three) times daily. Patient not taking: Reported on 09/06/2023    [provider]  HYDROcodone -acetaminophen  (NORCO/VICODIN) 5-325 MG tablet Take 1 tablet by mouth every 8 (eight) hours as needed for moderate pain.  Patient not  taking: Reported on 09/06/2023    [provider]  LEVEMIR 100 UNIT/ML injection Inject 53 Units into the skin at bedtime.  Patient not taking: Reported on 09/06/2023 06/02/18   [provider]  lisinopril -hydrochlorothiazide  (ZESTORETIC) 20-12.5 MG tablet Take 2 tablets by mouth daily. Patient not taking: Reported on 09/06/2023    [provider]    Inpatient Medications: Scheduled Meds:  insulin  aspart  0-9 Units Subcutaneous Q6H   Continuous Infusions:  heparin  1,200  Units/hr (09/06/23 2114)   PRN Meds: acetaminophen  **OR** acetaminophen , melatonin, ondansetron  (ZOFRAN ) IV  Allergies:    Allergies  Allergen Reactions   Banana Swelling and Other (See Comments)    Facial swelling   Penicillins Itching, Swelling and Rash    Face swells and break out   Strawberry Extract Swelling and Other (See Comments)    Facial swelling    Social History:   Social History   Socioeconomic History   Marital status: Single    Spouse name: Not on file   Number of children: Not on file   Years of education: Not on file   Highest education level: Not on file  Occupational History   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   Drug use: No   Sexual activity: Not Currently    Birth control/protection: Post-menopausal  Other Topics Concern   Not on file  Social History Narrative   Lives at [facility] since 12/19/14     Does not routinely exercise.     works as  Pension scheme manager.    >25 pack year history.    FULL CODE   Social Drivers of Corporate investment banker Strain: Not on file  Food Insecurity: No Food Insecurity (09/06/2023)   Hunger Vital Sign    Worried About Running Out of Food in the Last Year: Never true    Ran Out of Food in the Last Year: Never true  Transportation Needs: No Transportation Needs (09/06/2023)   PRAPARE - Administrator, Civil Service (Medical): No    Lack of Transportation (Non-Medical): No  Physical Activity: Not on file  Stress: Not on file  Social Connections: Unknown (09/06/2023)   Social Connection and Isolation Panel [NHANES]    Frequency of Communication with Friends and Family: More than three times a week    Frequency of Social Gatherings with Friends and Family: More than three times a week    Attends Religious Services: More than 4 times per year    Active Member of [employer] or Organizations: No    Attends Banker Meetings: 1 to 4 times per year    Marital Status: Patient declined  Intimate Partner Violence: Not At Risk (09/06/2023)   Humiliation, Afraid, Rape, and Kick questionnaire    Fear of Current or Ex-Partner: No    Emotionally Abused: No    Physically Abused: No    Sexually Abused: No    Family History:   Family History  Problem Relation Age of Onset   Pneumonia Mother    Gallbladder disease Mother        cancer   Heart failure Mother    Diabetes Father    Coronary artery disease Father    Stroke Neg Hx      ROS:  Please see the history of present illness.  All other ROS reviewed and  negative.     Physical Exam/Data:   Vitals:   09/06/23 1700 09/06/23 1830 09/06/23 1851 09/07/23 0000  BP: (!) 127/114 132/75  97/60  Pulse: 94 86    Resp: (!) 29 (!) 28  (!) 24  Temp:   97.8 F (36.6 C) 97.6 F (36.4 C)  TempSrc:   Oral Oral  SpO2: 98% 97%  92%  Weight:    113.5 kg  Height:    5\' 6"  (1.676 m)   No intake or output data in the 24 hours ending  09/07/23 0158    09/07/2023   12:00 AM 09/06/2023    1:53 PM 07/22/2021    6:37 PM  Last 3 Weights  Weight (lbs) 250 lb 3.6 oz 246 lb 14.6 oz 243 lb  Weight (kg) 113.5 kg 112 kg 110.224 kg     Body mass index is 40.39 kg/m.  General:  Well nourished, well developed, in no acute distress HEENT: normal Neck: Difficult to assess due to body habitus. Vascular: No carotid bruits; Distal pulses 2+ bilaterally Cardiac:  normal S1, S2; RRR; no murmur  Lungs:  Bilateral wheezing, no rhonchi or rales  Abd: soft, nontender, no hepatomegaly  Ext: no edema Musculoskeletal:  No deformities, BUE and BLE strength normal and equal Skin: warm and dry  Neuro:  CNs 2-12 intact, no focal abnormalities noted Psych:  Normal affect   EKG:  The EKG was personally reviewed and demonstrates:  09/06/23 (14:25 hrs): normal sinus rhythm with premature ventricular complexes; non-specific ST and T-wave changes.   Telemetry:  Telemetry was personally reviewed and demonstrates:  Sinus rhythm  Relevant CV Studies: # Echocardiogram 01/10/13: Study Conclusions  Left ventricle: The cavity size was normal. Wall thickness  was increased in a pattern of mild LVH. Systolic function  was normal. The estimated ejection fraction was in the range  of 55% to 60%. Doppler parameters are consistent with  abnormal left ventricular relaxation (grade 1 diastolic  dysfunction).   Laboratory Data:  High Sensitivity Troponin:   Recent Labs  Lab 09/06/23 1442 09/06/23 1800  TROPONINIHS 168* 5,743*     Chemistry Recent Labs  Lab 09/06/23 1442  NA 139  K 4.7  CL 110  CO2 19*  GLUCOSE 278*  BUN 18  CREATININE 1.23*  CALCIUM  6.8*  GFRNONAA 48*  ANIONGAP 10    Recent Labs  Lab 09/06/23 1442  PROT 5.7*  ALBUMIN  2.0*  AST 32  ALT 18  ALKPHOS 106  BILITOT 1.3*   Lipids No results for input(s): "CHOL", "TRIG", "HDL", "LABVLDL", "LDLCALC", "CHOLHDL" in the last 168 hours.  Hematology Recent Labs  Lab  09/06/23 1442  WBC 17.8*  RBC 4.54  HGB 11.6*  HCT 39.9  MCV 87.9  MCH 25.6*  MCHC 29.1*  RDW 19.9*  PLT 271   Thyroid  No results for input(s): "TSH", "FREET4" in the last 168 hours.  BNP Recent Labs  Lab 09/06/23 1404  BNP 359.9*    DDimer No results for input(s): "DDIMER" in the last 168 hours.   Radiology/Studies:  CT Angio Chest PE W and/or Wo Contrast Result Date: 09/06/2023 CLINICAL DATA:  Shortness of breath EXAM: CT ANGIOGRAPHY CHEST WITH CONTRAST TECHNIQUE: Multidetector CT imaging of the chest was performed using the standard protocol during bolus administration of intravenous contrast. Multiplanar CT image reconstructions and MIPs were obtained to evaluate the vascular anatomy. RADIATION DOSE REDUCTION: This exam was performed according to the departmental dose-optimization program which includes automated exposure control, adjustment  of the mA and/or kV according to patient size and/or use of iterative reconstruction technique. CONTRAST:  75mL OMNIPAQUE  IOHEXOL  350 MG/ML SOLN COMPARISON:  Chest x-ray 09/06/2023 CT report 06/09/2018 FINDINGS: Cardiovascular: Satisfactory opacification of the pulmonary arteries to the segmental level. No evidence of pulmonary embolism. Moderate aortic atherosclerosis. No aneurysm. Coronary vascular calcification. Mild cardiomegaly. Small pericardial effusion Mediastinum/Nodes: Patent trachea. No thyroid  mass. Subcentimeter mediastinal lymph nodes. Prominent right hilar node measuring 16 mm. Esophagus within normal limits Lungs/Pleura: Small bilateral pleural effusions. Generalized hazy pulmonary densities suspected to represent edema. Mild passive atelectasis in the lower lobes Upper Abdomen: No acute finding partially visualized 2.1 cm left adrenal nodule, stable per prior report Musculoskeletal: Multilevel degenerative changes. No acute osseous abnormality Review of the MIP images confirms the above findings. IMPRESSION: 1. Negative for acute  pulmonary embolus. 2. Cardiomegaly with small bilateral pleural effusions. Diffuse hazy pulmonary density probably represents mild pulmonary edema 3. Mildly prominent right hilar lymph node, nonspecific Aortic Atherosclerosis (ICD10-I70.0). Electronically Signed   By: Esmeralda Hedge M.D.   On: 09/06/2023 19:39   DG Chest Portable 1 View Result Date: 09/06/2023 CLINICAL DATA:  Shortness of breath EXAM: PORTABLE CHEST 1 VIEW COMPARISON:  10/29/2013 FINDINGS: Low lung volumes. Cardiomegaly with mild central congestion. No pleural effusion, focal airspace disease or pneumothorax. IMPRESSION: Cardiomegaly with mild central congestion. Electronically Signed   By: Esmeralda Hedge M.D.   On: 09/06/2023 16:11     Assessment and Plan:   Elevated HS-troponin: Patient with elevated HS-troponin with a delta that would be consistent with NSTEMI-ACS in the absence of any other causes given coronary CT angiography ruled out pulmonary embolism.  Possible silent acute myocardial infarction in the setting of type II diabetes.  Patient placed on heparin  drip and started on heparin .  She is hemodynamically stable.  Echocardiogram has been ordered. --Please make NPO for possible coronary angiography. --Continue aspirin  and heparin . --Cardiology to review echocardiogram.   --Check lipid panel and A1c. --Start atorvastatin  80 mg daily.    Hypertension: Primary hypertension with noted stage IIIa chronic kidney disease.  Blood pressure at this time is controlled.  Patient on appropriate co-morbidity congruent blood pressure medications prior. --Restart home blood pressure medications.  Heart failure with recovered ejection fraction: Patient with reported prior left ventricular EF 35% (years prior), but normal at this time.  Previously taking lisinopril  and metoprolol  succinate.   --Can consider adding SGLT2 inhibitor if no contra-indication post possible coronary angiography.    Risk Assessment/Risk Scores:     TIMI  Risk Score for Unstable Angina or Non-ST Elevation MI:   The patient's TIMI risk score is 5, which indicates a 26% risk of all cause mortality, new or recurrent myocardial infarction or need for urgent revascularization in the next 14 days.          For questions or updates, please contact [facility] Please consult [email or web address] for contact info under    Signed, Antonieta Battle, MD  09/07/2023 1:58 AM

## 2023-09-07 NOTE — Progress Notes (Signed)
 Came to see patient again. She appears comfortable but has ongoing wheezing. BP soft. Output to lasix  not recorded  Troponin now > 24k Echo shows severe LV dysfunction. EF 20-25%. Moderate pulmonary HTN. Mild to moderate MR, mod TR   Definitely high risk for clinical deterioration. I think we need to proceed with [facility] to define options which may be limited.   Savana Spina [place] MD, [facility]

## 2023-09-07 NOTE — Progress Notes (Addendum)
 PROGRESS NOTE                                                                                                                                                                                                             Patient Demographics:    [name], is a 68 y.o. female, DOB - Jan 28, 1956, ZOX:[id number]  Outpatient Primary MD for the patient is Arnaez Zapata, Gerardo E, MD    LOS - 1  Admit date - 09/06/2023    Chief Complaint  Patient presents with   Shortness of Breath       Brief Narrative (HPI from H&P)    [name] is a 68 y.o. female with medical history significant for chronic diastolic heart failure, type 2 diabetes mellitus complicated by diabetic peripheral polyneuropathy, essential pretension, hyperlipidemia, chronic iron  deficiency anemia with baseline hemoglobin range 9-11, who is admitted to [facility] on 09/06/2023 with NSTEMI after presenting from SNF to [facility] ED for evaluation of hypoxia.     Routine vital signs at the patient's SNF earlier today revealed oxygen saturations into the 80s on room air, in the absence of any known baseline supplemental oxygen requirements.  This prompted the patient to be brought to [facility] emergency department for further evaluation management of this new supplemental oxygen requirement.  The patient denies any associated acute symptoms, including no recent chest pain, shortness of breath, palpitations, diaphoresis, nausea, vomiting, dizziness, presyncope, or syncope.  She denies any recent subjective fever, chills, rigors, or generalized myalgias.  No new cough, hemoptysis.  Denies any recent dysuria or gross hematuria   Her medical history includes a history of chronic diastolic heart failure, with most recent echocardiogram in August 2014 showing LVEF 55 to 60% with grade 1 diastolic dysfunction.  She is not currently on any diuretic medications as an  outpatient.  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  She conveys that she is a former smoker, but denies any known history of chronic underlying pathology, including no known history of COPD.  High sensitive troponin I was initially 168, with repeat value trending up to 5743. EKG shows sinus tachycardia, ventricular trigeminy with ventricular rate 103, T wave inversion in 2, 3, aVF, V5, V6, all of which appear  new relative to most recent prior EKG from June 2015, but no evidence of ST elevation.  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  Cardiology recommends full dose aspirin  x 1 as well as heparin  drip, requests escalation of home atorvastatin  to 80 mg p.o. daily, checking of lipid panel, and for the patient to be kept n.p.o., with the possibility of coronary angiography on 09/07/2023.    Subjective:    [name] was evaluated at the bed side. Patient evaluated at the bedside with multiple family members in the room. She continues to denies any chest pain but reports ongoing shortness of breath.  Updated family on new heart failure diagnosis and plan for ischemic evaluation.  Family reported there is a family history of heart failure in patient's mother.   Assessment  & Plan :   Assessment and Plan:  # NSTEMI # Hx of nonobstructive CAD - Shortness of breath on presentation but no chest pain - Troponin up trended to >24K from 5K on admission - Cardiology following, plan for left and right heart cath - Telemetry  # CHF exacerbation # Acute on chronic systolic and diastolic heart failure - History of diastolic heart failure -  Repeat TTE showed EF 20-25%, G2DD, moderate pulmonary hypertension, moderate TR and mild to moderate MR - Cardiology following, appreciate recs - Continue IV Lasix  - Continue on supplemental O2 - Strict I&O's, daily weights  # T2DM - A1c of 7.3% - CBGs elevated to the 170s to 200s - Continue Lantus  15 units twice daily - SSI with meals, CBG monitoring  # AKI on CKD 3A - Bump in creatinine from 1.23 on admission to 1.50 - Likely secondary to heart failure exacerbation - Continue IV diuresing as above  # HTN - BP intermittently soft with SBP in the 90s to 110s - Continue Toprol  XL  # HLD - Continue atorvastatin   # Iron  deficiency anemia -Hemoglobin stable at 11.2 -Trend CBC  # Bilateral amputee - Currently resides in [place] [facility] as a LTC pt - PT/OT eval-treat - Fall precautions  # Class III Obesity: Estimated body mass index is 40.39 kg/m as calculated from the following:   Height as of this encounter: 5\' 6"  (1.676 m).   Weight as of this encounter: 113.5 kg.   # Pressure Injury - Pressure injury sacrum stage 1, present on admission at the time of the admission order - Continue wound care       Condition -stable  Family Communication  : Discussed plan with multiple family members in her room  Code Status : Full  Consults  : Cardiology  PUD Prophylaxis : None   Procedures  :     Left and right heart cath      Disposition Plan  :    Status is: Inpatient Remains inpatient appropriate because: Management of NSTEMI  DVT Prophylaxis  :    SCDs Start: 09/06/23 2052     Lab Results  Component Value Date   PLT 224 09/07/2023    Diet :  Diet Order             Diet NPO time specified Except for: Sips with Meds  Diet effective now  Inpatient Medications  Scheduled Meds:  atorvastatin   80 mg Oral Daily   ferrous sulfate   325 mg Oral BID WC   insulin  aspart  0-9 Units Subcutaneous Q6H   insulin  glargine-yfgn  15  Units Subcutaneous BID   metoprolol  succinate  50 mg Oral Daily   Continuous Infusions:  heparin  1,300 Units/hr (09/07/23 0432)   PRN Meds:.acetaminophen  **OR** acetaminophen , melatonin, ondansetron  (ZOFRAN ) IV  Antibiotics  :    Anti-infectives (From admission, onward)    None         Objective:   Vitals:   09/07/23 0200 09/07/23 0400 09/07/23 0645 09/07/23 0713  BP: 91/70 (!) 119/94  (!) 114/53  Pulse: 81 66  83  Resp: (!) 24 (!) 26  (!) 32  Temp: 98.1 F (36.7 C) 98.2 F (36.8 C)  98.2 F (36.8 C)  TempSrc:  Oral  Oral  SpO2: 91% 92%  93%  Weight:   113.5 kg   Height:        Wt Readings from Last 3 Encounters:  09/07/23 113.5 kg  07/22/21 110.2 kg  04/05/19 110.2 kg     Intake/Output Summary (Last 24 hours) at 09/07/2023 0911 Last data filed at 09/07/2023 0432 Gross per 24 hour  Intake 124.48 ml  Output --  Net 124.48 ml     Physical Exam  General: Pleasant, chronically ill appearing obese woman laying in bed. No acute distress. HEENT: [place]/AT. Anicteric sclera. MMM. CV: Regular rate. Irregular rhythm. No murmurs, rubs, or gallops.  Pulmonary: On 4 L [place]. Tachypnea. Slightly increased work of breathing. Mild expiratory wheezes. Abdominal: Soft, NT/ND. Normal bowel sounds. Extremities: Bilateral below-knee amputation Skin: Warm and dry. No obvious rash or lesions. Neuro: A&Ox3. Moves all extremities. Normal sensation to light touch. No focal deficit. Psych: Normal mood and affect    RN pressure injury documentation: Pressure Injury 09/07/23 Sacrum Right;Left;Mid Stage 1 -  Intact skin with non-blanchable redness of a localized area usually over a bony prominence. (Active)  09/07/23 0020  Location: Sacrum  Location Orientation: Right;Left;Mid  Staging: Stage 1 -  Intact skin with non-blanchable redness of a localized area usually over a bony prominence.  Wound Description (Comments):   Present on Admission: Yes  Dressing Type Foam - Lift dressing to  assess site every shift 09/07/23 0000      Data Review:    Recent Labs  Lab 09/06/23 1442 09/07/23 0212  WBC 17.8* 15.2*  HGB 11.6* 11.2*  HCT 39.9 37.8  PLT 271 224  MCV 87.9 85.3  MCH 25.6* 25.3*  MCHC 29.1* 29.6*  RDW 19.9* 19.5*  LYMPHSABS 1.4  --   MONOABS 0.4  --   EOSABS 0.1  --   BASOSABS 0.1  --     Recent Labs  Lab 09/06/23 1404 09/06/23 1442 09/07/23 0211 09/07/23 0212  NA  --  139  --  140  K  --  4.7  --  4.7  CL  --  110  --  106  CO2  --  19*  --  22  ANIONGAP  --  10  --  12  GLUCOSE  --  278*  --  221*  BUN  --  18  --  24*  CREATININE  --  1.23*  --  1.50*  AST  --  32  --  112*  ALT  --  18  --  37  ALKPHOS  --  106  --  129*  BILITOT  --  1.3*  --  0.7  ALBUMIN   --  2.0*  --  2.6*  PROCALCITON  --   --  0.10  --   HGBA1C  --   --   --  7.3*  BNP 359.9*  --   --   --   MG  --   --   --  1.9  CALCIUM   --  6.8*  --  8.0*      Recent Labs  Lab 09/06/23 1404 09/06/23 1442 09/07/23 0211 09/07/23 0212  PROCALCITON  --   --  0.10  --   HGBA1C  --   --   --  7.3*  BNP 359.9*  --   --   --   MG  --   --   --  1.9  CALCIUM   --  6.8*  --  8.0*    --------------------------------------------------------------------------------------------------------------- Lab Results  Component Value Date   CHOL 121 07/04/2015   HDL 31 (A) 07/04/2015   LDLCALC 62 07/04/2015   TRIG 143 07/04/2015   CHOLHDL 4.8 01/10/2013    Lab Results  Component Value Date   HGBA1C 7.3 (H) 09/07/2023   No results for input(s): "TSH", "T4TOTAL", "FREET4", "T3FREE", "THYROIDAB" in the last 72 hours. No results for input(s): "VITAMINB12", "FOLATE", "FERRITIN", "TIBC", "IRON ", "RETICCTPCT" in the last 72 hours. ------------------------------------------------------------------------------------------------------------------ Cardiac Enzymes No results for input(s): "CKMB", "TROPONINI", "MYOGLOBIN" in the last 168 hours.  Invalid input(s): "CK"  Micro  Results Recent Results (from the past 240 hours)  Resp panel by RT-PCR (RSV, Flu A&B, Covid) Anterior Nasal Swab     Status: None   Collection Time: 09/06/23  2:05 PM   Specimen: Anterior Nasal Swab  Result Value Ref Range Status   SARS Coronavirus 2 by RT PCR NEGATIVE NEGATIVE Final   Influenza A by PCR NEGATIVE NEGATIVE Final   Influenza B by PCR NEGATIVE NEGATIVE Final    Comment: (NOTE) The Xpert Xpress SARS-CoV-2/FLU/RSV plus assay is intended as an aid in the diagnosis of influenza from Nasopharyngeal swab specimens and should not be used as a sole basis for treatment. Nasal washings and aspirates are unacceptable for Xpert Xpress SARS-CoV-2/FLU/RSV testing.  Fact Sheet for Patients: BloggerCourse.com  Fact Sheet for Healthcare Providers: SeriousBroker.it  This test is not yet approved or cleared by the [place]  FDA and has been authorized for detection and/or diagnosis of SARS-CoV-2 by FDA under an Emergency Use Authorization (EUA). This EUA will remain in effect (meaning this test can be used) for the duration of the COVID-19 declaration under Section 564(b)(1) of the Act, 21 U.S.C. section 360bbb-3(b)(1), unless the authorization is terminated or revoked.     Resp Syncytial Virus by PCR NEGATIVE NEGATIVE Final    Comment: (NOTE) Fact Sheet for Patients: BloggerCourse.com  Fact Sheet for Healthcare Providers: SeriousBroker.it  This test is not yet approved or cleared by the [place]  FDA and has been authorized for detection and/or diagnosis of SARS-CoV-2 by FDA under an Emergency Use Authorization (EUA). This EUA will remain in effect (meaning this test can be used) for the duration of the COVID-19 declaration under Section 564(b)(1) of the Act, 21 U.S.C. section 360bbb-3(b)(1), unless the authorization is terminated or revoked.  Performed at [facility], [address], [place], [place] [address]   MRSA Next Gen by PCR, Nasal     Status: None   Collection Time: 09/07/23  1:10 AM   Specimen: Nasal Mucosa; Nasal Swab  Result Value Ref  Range Status   MRSA by PCR Next Gen NOT DETECTED NOT DETECTED Final    Comment: (NOTE) The GeneXpert MRSA Assay (FDA approved for NASAL specimens only), is one component of a comprehensive MRSA colonization surveillance program. It is not intended to diagnose MRSA infection nor to guide or monitor treatment for MRSA infections. Test performance is not FDA approved in patients less than 22 years old. Performed at [facility], [address] [place], [place] [address]     Radiology Reports CT Angio Chest PE W and/or Wo Contrast Result Date: 09/06/2023 CLINICAL DATA:  Shortness of breath EXAM: CT ANGIOGRAPHY CHEST WITH CONTRAST TECHNIQUE: Multidetector CT imaging of the chest was performed using the standard protocol during bolus administration of intravenous contrast. Multiplanar CT image reconstructions and MIPs were obtained to evaluate the vascular anatomy. RADIATION DOSE REDUCTION: This exam was performed according to the departmental dose-optimization program which includes automated exposure control, adjustment of the mA and/or kV according to patient size and/or use of iterative reconstruction technique. CONTRAST:  75mL OMNIPAQUE  IOHEXOL  350 MG/ML SOLN COMPARISON:  Chest x-ray 09/06/2023 CT report 06/09/2018 FINDINGS: Cardiovascular: Satisfactory opacification of the pulmonary arteries to the segmental level. No evidence of pulmonary embolism. Moderate aortic atherosclerosis. No aneurysm. Coronary vascular calcification. Mild cardiomegaly. Small pericardial effusion Mediastinum/Nodes: Patent trachea. No thyroid  mass. Subcentimeter mediastinal lymph nodes. Prominent right hilar node measuring 16 mm. Esophagus within normal limits Lungs/Pleura: Small bilateral pleural effusions. Generalized hazy  pulmonary densities suspected to represent edema. Mild passive atelectasis in the lower lobes Upper Abdomen: No acute finding partially visualized 2.1 cm left adrenal nodule, stable per prior report Musculoskeletal: Multilevel degenerative changes. No acute osseous abnormality Review of the MIP images confirms the above findings. IMPRESSION: 1. Negative for acute pulmonary embolus. 2. Cardiomegaly with small bilateral pleural effusions. Diffuse hazy pulmonary density probably represents mild pulmonary edema 3. Mildly prominent right hilar lymph node, nonspecific Aortic Atherosclerosis (ICD10-I70.0). Electronically Signed   By: Esmeralda Hedge M.D.   On: 09/06/2023 19:39   DG Chest Portable 1 View Result Date: 09/06/2023 CLINICAL DATA:  Shortness of breath EXAM: PORTABLE CHEST 1 VIEW COMPARISON:  10/29/2013 FINDINGS: Low lung volumes. Cardiomegaly with mild central congestion. No pleural effusion, focal airspace disease or pneumothorax. IMPRESSION: Cardiomegaly with mild central congestion. Electronically Signed   By: Esmeralda Hedge M.D.   On: 09/06/2023 16:11      Signature  -   Vita Grip M.D on 09/07/2023 at 9:11 AM   -  To page go to [email or web address]

## 2023-09-07 NOTE — Progress Notes (Signed)
 RT transported pt on bipap from cath lab to 2H13 without any complications.

## 2023-09-07 NOTE — Progress Notes (Signed)
 eLink Physician-Brief Progress Note Patient Name: [name] DOB: April 20, 1956 MRN: [id number]   Date of Service  09/07/2023  HPI/Events of Note  [age]-year-old female with a history of nonobstructive coronary artery disease, CVA, and a history of heart failure status post recent BKA who is seen for NSTEMI.  Patient had some degree of respiratory distress, elevated troponin with severely reduced LV function and was transferred to the ICU for further management.  Patient has normal vitals on examination.  Currently on BiPAP with adequate ventilation and oxygenation.  Lab results unremarkable, EKG with ST and T wave abnormalities in the inferolateral distribution.  PCI was deferred after noting significant stenosis in the RCA with anticipated staged procedure.  eICU Interventions  Maintain heparin , dual antiplatelet therapy, and GDMT as tolerated.  Anticipating heart failure involvement and likely staged intervention  DVT prophylaxis with heparin  GI prophylaxis not indicated     Intervention Category Evaluation Type: New Patient Evaluation  [name] 09/07/2023, 8:18 PM

## 2023-09-07 NOTE — Plan of Care (Signed)
  Problem: Clinical Measurements: Goal: Respiratory complications will improve Outcome: Progressing   Problem: Coping: Goal: Level of anxiety will decrease Outcome: Progressing   Problem: Pain Managment: Goal: General experience of comfort will improve and/or be controlled Outcome: Progressing   Problem: Safety: Goal: Ability to remain free from injury will improve Outcome: Progressing

## 2023-09-07 NOTE — Progress Notes (Signed)
 PHARMACY - ANTICOAGULATION CONSULT NOTE  Pharmacy Consult for heparin  Indication: chest pain/ACS  Allergies  Allergen Reactions   Banana Swelling and Other (See Comments)    Facial swelling   Penicillins Itching, Swelling and Rash    Face swells and break out   Strawberry Extract Swelling and Other (See Comments)    Facial swelling    Patient Measurements: Height: 5\' 6"  (167.6 cm) Weight: 113.5 kg (250 lb 3.6 oz) IBW/kg (Calculated) : 59.3 HEPARIN  DW (KG): 85.9  Vital Signs: Temp: 98.9 F (37.2 C) (04/21 1524) Temp Source: Oral (04/21 1524) BP: 110/56 (04/21 1700) Pulse Rate: 99 (04/21 1715)  Labs: Recent Labs    09/06/23 1442 09/06/23 1800 09/07/23 0212 09/07/23 0856 09/07/23 1206 09/07/23 1815 09/07/23 1822 09/07/23 1823 09/07/23 1844  HGB 11.6*  --  11.2*  --   --    < > 12.2 11.9* 10.9*  HCT 39.9  --  37.8  --   --    < > 36.0 35.0* 32.0*  PLT 271  --  224  --   --   --   --   --   --   HEPARINUNFRC  --   --  0.27*  --  0.27*  --   --   --   --   CREATININE 1.23*  --  1.50*  --   --   --   --   --   --   TROPONINIHS 168* 5,743*  --  >24,000*  --   --   --   --   --    < > = values in this interval not displayed.    Estimated Creatinine Clearance: 45.9 mL/min (A) (by C-G formula based on SCr of 1.5 mg/dL (H)).   Medical History: Past Medical History:  Diagnosis Date   Atypical chest pain    a. non obstructive by cath in 2008 and 2010;  b. 02/2012 Myoview : non-ischemic, EF 57%   Cellulitis    a. left foot-> s/p L BKA   Chronic kidney disease    Constipation    CVA (cerebral infarction)    a.  Small right parietal noted incidentally 04/2007;  b. right sided embolic CVA 05/2012;  c. TEE 2/14:  LVH, EF 55-60%, mild LAE, no LAA clot, no PFO, no R->L shunt by echo contrast, oscillating density on AV likely Lambl's Excressence    Diabetes mellitus, type II (HCC)    Diabetic retinopathy (HCC)    NPDR w/edema OU   Diaphragmatic hernia without mention of  obstruction or gangrene    Dry skin    Esophageal reflux    HTN (hypertension)    Hx of BKA (HCC)    Bilateral   Hyperlipidemia    Irritable bowel syndrome    Morbid obesity (HCC)    Obesity, unspecified    Pain in shoulder 06/28/2015   Peripheral neuropathy    Stroke [facility])    2013-'no residual'   Syncope and collapse    a. near-syncopal episode in November 2008;  b. 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   Tobacco abuse    Vertigo     Assessment: 13 YOF presenting with concern for ACS,  not on anticoagulation PTA.  Underwent cath 4/21 finding severe multivessel CAD and pulmonary hypertension. Plan to restart heparin  2 hours after TR band removed (documented on 4/21@2100 ) for medical management.  Had been on heparin  infusion at 1300 units/hr prior to change with slightly subtherapeutic level of 0.27.   Goal  of Therapy:  Heparin  level 0.3-0.7 units/ml Monitor platelets by anticoagulation protocol: Yes   Plan:  -Restart heparin  infusion at 1400 units/hr on 4/21@2300  -Order heparin  level 6 hours after restart -Monitor daily HL, CBC, and for s/sx of bleeding   Thank you for allowing pharmacy to participate in this patient's care,  Nieves Bars, PharmD, BCCCP Clinical Pharmacist  Phone: [phone] 09/07/2023 7:31 PM  Please check AMION for all [facility] Pharmacy phone numbers After 10:00 PM, call Main Pharmacy [phone]

## 2023-09-07 NOTE — Progress Notes (Addendum)
 PHARMACY - ANTICOAGULATION CONSULT NOTE  Pharmacy Consult for heparin  Indication: chest pain/ACS  Allergies  Allergen Reactions   Banana Swelling and Other (See Comments)    Facial swelling   Penicillins Itching, Swelling and Rash    Face swells and break out   Strawberry Extract Swelling and Other (See Comments)    Facial swelling    Patient Measurements: Height: 5\' 6"  (167.6 cm) Weight: 113.5 kg (250 lb 3.6 oz) IBW/kg (Calculated) : 59.3 HEPARIN  DW (KG): 85.9  Vital Signs: Temp: 98.2 F (36.8 C) (04/21 0400) Temp Source: Oral (04/21 0400) BP: 91/70 (04/21 0200) Pulse Rate: 81 (04/21 0200)  Labs: Recent Labs    09/06/23 1442 09/06/23 1800 09/07/23 0212  HGB 11.6*  --  11.2*  HCT 39.9  --  37.8  PLT 271  --  224  HEPARINUNFRC  --   --  0.27*  CREATININE 1.23*  --  1.50*  TROPONINIHS 168* 5,743*  --     Estimated Creatinine Clearance: 45.9 mL/min (A) (by C-G formula based on SCr of 1.5 mg/dL (H)).   Medical History: Past Medical History:  Diagnosis Date   Atypical chest pain    a. non obstructive by cath in 2008 and 2010;  b. 02/2012 Myoview : non-ischemic, EF 57%   Cellulitis    a. left foot-> s/p L BKA   Chronic kidney disease    Constipation    CVA (cerebral infarction)    a.  Small right parietal noted incidentally 04/2007;  b. right sided embolic CVA 05/2012;  c. TEE 2/14:  LVH, EF 55-60%, mild LAE, no LAA clot, no PFO, no R->L shunt by echo contrast, oscillating density on AV likely Lambl's Excressence    Diabetes mellitus, type II (HCC)    Diabetic retinopathy (HCC)    NPDR w/edema OU   Diaphragmatic hernia without mention of obstruction or gangrene    Dry skin    Esophageal reflux    HTN (hypertension)    Hx of BKA (HCC)    Bilateral   Hyperlipidemia    Irritable bowel syndrome    Morbid obesity (HCC)    Obesity, unspecified    Pain in shoulder 06/28/2015   Peripheral neuropathy    Stroke [facility])    2013-'no residual'   Syncope and collapse     a. near-syncopal episode in November 2008;  b. 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   Tobacco abuse    Vertigo     Assessment: 73 YOF presenting with concern for ACS,  not on anticoagulation PTA  4/21 AM  update:  Heparin  level sub-therapeutic   Goal of Therapy:  Heparin  level 0.3-0.7 units/ml Monitor platelets by anticoagulation protocol: Yes   Plan:  Inc heparin  to 1300 units/hr Heparin  level in 8 hours  Silvestre Drum, PharmD, BCPS Clinical Pharmacist Phone: [phone]

## 2023-09-07 NOTE — TOC Initial Note (Signed)
 Transition of Care [facility]) - Initial/Assessment Note    Patient Details  Name: [name] MRN: [id number] Date of Birth: August 27, 1955  Transition of Care [facility]) CM/SW Contact:    Juliane Och, LCSW Phone Number: 09/07/2023, 1:13 PM  Clinical Narrative:                  1:13 PM CSW introduced self and role to patient at bedside. Patient confirmed she is from [facility] and expects to return upon discharge. CSW offered to coordinate ambulance transportation for discharge and made patient aware of possible copay. Patient expressed understanding of the information. CSW confirmed with [facility] that they are able to accept her back. [facility] confirmed that patient is LTC at [facility] but requested PT/OT, fl2 and insurance authorization to assess for skilled needs. CSW relayed request to medical team.  Expected Discharge Plan: Skilled Nursing Facility Barriers to Discharge: Continued Medical Work up, English as a second language teacher   Patient Goals and CMS Choice Patient states their goals for this hospitalization and ongoing recovery are:: to return to [facility]          Expected Discharge Plan and Services In-house Referral: Clinical Social Work   Post Acute Care Choice: Skilled Nursing Facility Living arrangements for the past 2 months: Skilled Nursing Facility                                      Prior Living Arrangements/Services Living arrangements for the past 2 months: Skilled Nursing Facility Lives with:: Facility Resident Patient language and need for interpreter reviewed:: Yes        Need for Family Participation in Patient Care: No (Comment) Care giver support system in place?: Yes (comment)   Criminal Activity/Legal Involvement Pertinent to Current Situation/Hospitalization: No - Comment as needed  Activities of Daily Living   ADL Screening (condition at time of admission) Independently performs ADLs?: No Does the patient have a NEW difficulty  with bathing/dressing/toileting/self-feeding that is expected to last >3 days?: No Does the patient have a NEW difficulty with getting in/out of bed, walking, or climbing stairs that is expected to last >3 days?: No Does the patient have a NEW difficulty with communication that is expected to last >3 days?: No Is the patient deaf or have difficulty hearing?: No Does the patient have difficulty seeing, even when wearing glasses/contacts?: No Does the patient have difficulty concentrating, remembering, or making decisions?: No  Permission Sought/Granted Permission sought to share information with : Facility Medical sales representative, Family Supports Permission granted to share information with : No (Contact information on chart)  Share Information with NAME: Imagene Boss  Permission granted to share info w AGENCY: [facility]  Permission granted to share info w Relationship: Aunt  Permission granted to share info w Contact Information: [phone]  Emotional Assessment Appearance:: Appears stated age Attitude/Demeanor/Rapport: Engaged Affect (typically observed): Accepting, Appropriate, Adaptable, Calm, Stable, Pleasant Orientation: : Oriented to Situation, Oriented to  Time, Oriented to Place, Oriented to Self Alcohol / Substance Use: Not Applicable Psych Involvement: No (comment)  Admission diagnosis:  NSTEMI (non-ST elevated myocardial infarction) [facility]) [I21.4] Patient Active Problem List   Diagnosis Date Noted   Leukocytosis 09/07/2023   Chronic diastolic CHF (congestive heart failure) (HCC) 09/07/2023   DM2 (diabetes mellitus, type 2) (HCC) 09/07/2023   Chronic iron  deficiency anemia 09/07/2023   NSTEMI (non-ST elevated myocardial infarction) (HCC) 09/06/2023   Heme positive stool  Rectal polyp    Adenomatous polyp of transverse colon    Gastritis and gastroduodenitis    Phantom limb pain (HCC) 11/09/2017   Other abnormalities of gait and mobility 11/09/2017   S/P BKA  (below knee amputation) bilateral (HCC) 08/20/2015   Dental caries 08/20/2015   Constipation 08/15/2015   Depression 08/15/2015   Pain in shoulder 06/28/2015   Hemiparesis (HCC) 01/10/2013   Tobacco abuse    Paresthesia of right arm and leg 12/17/2011   Facial droop 12/15/2011   TIA (transient ischemic attack) 12/15/2011   Syncope 07/02/2011   HTN (hypertension) 07/02/2011   Lower urinary tract infectious disease 07/02/2011   Normocytic anemia 07/02/2011   Hereditary and idiopathic peripheral neuropathy 04/02/2009   Essential hypertension 04/02/2009   CAD, NATIVE VESSEL 04/02/2009   HIATAL HERNIA 04/02/2009   Irritable bowel syndrome 04/02/2009   HLD (hyperlipidemia) 05/30/2008   OBESITY 05/30/2008   LEFT VENTRICULAR FUNCTION, DECREASED 05/30/2008   CVA (cerebral infarction) 05/30/2008   GERD 05/30/2008   PCP:  Marquetta Sit, MD Pharmacy:   [id number] - [place] - [address] [place] ([place] [address] Phone: [phone] Fax: [phone]  [facility] [id number] [place], [place] - [address] [place] [address] Phone: [phone] Fax: [phone]     Social Drivers of Health (SDOH) Social History: SDOH Screenings   Food Insecurity: No Food Insecurity (09/06/2023)  Housing: Unknown (09/06/2023)  Transportation Needs: No Transportation Needs (09/06/2023)  Utilities: Not At Risk (09/06/2023)  Social Connections: Unknown (09/06/2023)  Tobacco Use: Medium Risk (09/07/2023)   SDOH Interventions:     Readmission Risk Interventions     No data to display

## 2023-09-07 NOTE — Progress Notes (Signed)
 RT called to cath lab to place pt on bipap during procedure. Pt tolerating it well at this time. RT will continue to monitor.   09/07/23 1800  Vent Select  Invasive or Noninvasive Noninvasive  Adult Vent Y  Adult Ventilator Settings  Vent Type Servo i  Vent Mode PSV;BIPAP  FiO2 (%) 100 %  Pressure Support 10 cmH20  PEEP 5 cmH20  Adult Ventilator Measurements  Peak Airway Pressure 16 L/min  Mean Airway Pressure 7 cmH20  Resp Rate Spontaneous 38 br/min  Resp Rate Total 38 br/min  Spont TV 393 mL  Measured Ve 14.7 L  Auto PEEP 0 cmH20  Total PEEP 5 cmH20  SpO2 100 %

## 2023-09-07 NOTE — Interval H&P Note (Signed)
 History and Physical Interval Note:  09/07/2023 5:48 PM  [name]  has presented today for surgery, with the diagnosis of nstemi.  Acute combined systolic and diastolic heart failure  Came to see patient again. She appears comfortable but has ongoing wheezing. BP soft. Output to lasix  not recorded   Troponin now > 24k Echo shows severe LV dysfunction. EF 20-25%. Moderate pulmonary HTN. Mild to moderate MR, mod TR     Definitely high risk for clinical deterioration. I think we need to proceed with [facility] to define options which may be limited.    Peter Swaziland MD, [facility]          The various methods of treatment have been discussed with the patient and family. After consideration of risks, benefits and other options for treatment, the patient has consented to  Procedure(s): RIGHT/LEFT HEART CATH AND CORONARY ANGIOGRAPHY (N/A) PERCUTANEOUS CORONARY INTERVENTION   as a surgical intervention.  The patient's history has been reviewed, patient examined, no change in status, stable for surgery.  I have reviewed the patient's chart and labs.  Questions were answered to the patient's satisfaction.    Cath Lab Visit (complete for each Cath Lab visit)  Clinical Evaluation Leading to the Procedure:   ACS: Yes.    Non-ACS:    Anginal Classification: CCS IV  Anti-ischemic medical therapy: Minimal Therapy (1 class of medications)  Non-Invasive Test Results: No non-invasive testing performed; severely reduced EF  Prior CABG: No previous CABG   Randene Bustard

## 2023-09-08 ENCOUNTER — Other Ambulatory Visit: Payer: Self-pay

## 2023-09-08 DIAGNOSIS — N179 Acute kidney failure, unspecified: Secondary | ICD-10-CM | POA: Diagnosis not present

## 2023-09-08 DIAGNOSIS — I493 Ventricular premature depolarization: Secondary | ICD-10-CM | POA: Diagnosis not present

## 2023-09-08 DIAGNOSIS — I5021 Acute systolic (congestive) heart failure: Secondary | ICD-10-CM | POA: Diagnosis not present

## 2023-09-08 DIAGNOSIS — I214 Non-ST elevation (NSTEMI) myocardial infarction: Secondary | ICD-10-CM | POA: Diagnosis not present

## 2023-09-08 DIAGNOSIS — I5043 Acute on chronic combined systolic (congestive) and diastolic (congestive) heart failure: Secondary | ICD-10-CM | POA: Diagnosis not present

## 2023-09-08 DIAGNOSIS — N1831 Chronic kidney disease, stage 3a: Secondary | ICD-10-CM | POA: Diagnosis not present

## 2023-09-08 LAB — BASIC METABOLIC PANEL WITH GFR
Anion gap: 11 (ref 5–15)
Anion gap: 12 (ref 5–15)
BUN: 30 mg/dL — ABNORMAL HIGH (ref 8–23)
BUN: 31 mg/dL — ABNORMAL HIGH (ref 8–23)
CO2: 24 mmol/L (ref 22–32)
CO2: 26 mmol/L (ref 22–32)
Calcium: 7.6 mg/dL — ABNORMAL LOW (ref 8.9–10.3)
Calcium: 7.7 mg/dL — ABNORMAL LOW (ref 8.9–10.3)
Chloride: 104 mmol/L (ref 98–111)
Chloride: 99 mmol/L (ref 98–111)
Creatinine, Ser: 1.76 mg/dL — ABNORMAL HIGH (ref 0.44–1.00)
Creatinine, Ser: 2.14 mg/dL — ABNORMAL HIGH (ref 0.44–1.00)
GFR, Estimated: 31 mL/min — ABNORMAL LOW (ref >60)
GFR, Estimated: 25 mL/min — ABNORMAL LOW (ref >60)
Glucose, Bld: 168 mg/dL — ABNORMAL HIGH (ref 70–99)
Glucose, Bld: 145 mg/dL — ABNORMAL HIGH (ref 70–99)
Potassium: 4.3 mmol/L (ref 3.5–5.1)
Potassium: 4 mmol/L (ref 3.5–5.1)
Sodium: 139 mmol/L (ref 135–145)
Sodium: 137 mmol/L (ref 135–145)

## 2023-09-08 LAB — CBC
HCT: 33 % — ABNORMAL LOW (ref 36.0–46.0)
Hemoglobin: 10 g/dL — ABNORMAL LOW (ref 12.0–15.0)
MCH: 25.4 pg — ABNORMAL LOW (ref 26.0–34.0)
MCHC: 30.3 g/dL (ref 30.0–36.0)
MCV: 83.8 fL (ref 80.0–100.0)
Platelets: 245 10*3/uL (ref 150–400)
RBC: 3.94 MIL/uL (ref 3.87–5.11)
RDW: 19 % — ABNORMAL HIGH (ref 11.5–15.5)
WBC: 12.2 10*3/uL — ABNORMAL HIGH (ref 4.0–10.5)
nRBC: 0.2 % (ref 0.0–0.2)

## 2023-09-08 LAB — HEPARIN LEVEL (UNFRACTIONATED): Heparin Unfractionated: 0.26 [IU]/mL — ABNORMAL LOW (ref 0.30–0.70)

## 2023-09-08 LAB — GLUCOSE, CAPILLARY
Glucose-Capillary: 120 mg/dL — ABNORMAL HIGH (ref 70–99)
Glucose-Capillary: 127 mg/dL — ABNORMAL HIGH (ref 70–99)
Glucose-Capillary: 147 mg/dL — ABNORMAL HIGH (ref 70–99)
Glucose-Capillary: 152 mg/dL — ABNORMAL HIGH (ref 70–99)
Glucose-Capillary: 157 mg/dL — ABNORMAL HIGH (ref 70–99)
Glucose-Capillary: 168 mg/dL — ABNORMAL HIGH (ref 70–99)
Glucose-Capillary: 175 mg/dL — ABNORMAL HIGH (ref 70–99)

## 2023-09-08 MED ORDER — MAGNESIUM SULFATE 2 GM/50ML IV SOLN
2.0000 g | Freq: Once | INTRAVENOUS | Status: AC
  Administered 2023-09-08: 2 g via INTRAVENOUS
  Filled 2023-09-08: qty 50

## 2023-09-08 MED ORDER — FUROSEMIDE 10 MG/ML IJ SOLN
160.0000 mg | Freq: Two times a day (BID) | INTRAVENOUS | Status: AC
  Administered 2023-09-08: 160 mg via INTRAVENOUS
  Filled 2023-09-08: qty 10

## 2023-09-08 MED ORDER — SODIUM CHLORIDE 0.9% FLUSH: 10.0000 mL | INTRAVENOUS | Status: DC | PRN

## 2023-09-08 MED ORDER — SODIUM CHLORIDE 0.9% FLUSH
10.0000 mL | Freq: Two times a day (BID) | INTRAVENOUS | Status: DC
  Administered 2023-09-08 (×2): 20 mL
  Administered 2023-09-09 (×2): 10 mL
  Administered 2023-09-10: 20 mL
  Administered 2023-09-10 – 2023-09-16 (×12): 10 mL

## 2023-09-08 MED ORDER — FUROSEMIDE 10 MG/ML IJ SOLN
160.0000 mg | Freq: Two times a day (BID) | INTRAVENOUS | Status: DC
  Administered 2023-09-08: 160 mg via INTRAVENOUS
  Filled 2023-09-08 (×3): qty 16

## 2023-09-08 MED ORDER — METOLAZONE 5 MG PO TABS
5.0000 mg | ORAL_TABLET | Freq: Once | ORAL | Status: AC
Start: 2023-09-08 — End: 2023-09-08
  Administered 2023-09-08: 5 mg via ORAL
  Filled 2023-09-08: qty 1

## 2023-09-08 MED ORDER — SENNOSIDES-DOCUSATE SODIUM 8.6-50 MG PO TABS: 1.0000 | ORAL_TABLET | Freq: Every evening | ORAL | Status: DC | PRN

## 2023-09-08 MED ORDER — SPIRONOLACTONE 25 MG PO TABS
25.0000 mg | ORAL_TABLET | Freq: Every day | ORAL | Status: DC
  Administered 2023-09-08: 25 mg via ORAL
  Filled 2023-09-08: qty 1

## 2023-09-08 MED ORDER — POLYETHYLENE GLYCOL 3350 17 G PO PACK
17.0000 g | PACK | Freq: Every day | ORAL | Status: DC | PRN
  Administered 2023-09-08: 17 g via ORAL
  Filled 2023-09-08: qty 1

## 2023-09-08 NOTE — Progress Notes (Signed)
 PHARMACY - ANTICOAGULATION CONSULT NOTE  Pharmacy Consult for heparin  Indication: chest pain/ACS  Allergies  Allergen Reactions   Banana Swelling and Other (See Comments)    Facial swelling   Penicillins Itching, Swelling and Rash    Face swells and break out   Strawberry Extract Swelling and Other (See Comments)    Facial swelling    Patient Measurements: Height: 5\' 6"  (167.6 cm) Weight: 107.7 kg (237 lb 7 oz) IBW/kg (Calculated) : 59.3 HEPARIN  DW (KG): 85.9  Vital Signs: Temp: 99.9 F (37.7 C) (04/22 0700) Temp Source: Axillary (04/22 0700) BP: 126/72 (04/22 1500) Pulse Rate: 77 (04/22 1500)  Labs: Recent Labs    09/06/23 1442 09/06/23 1800 09/07/23 0212 09/07/23 0856 09/07/23 1206 09/07/23 1815 09/07/23 1823 09/07/23 1844 09/08/23 0325 09/08/23 0805  HGB 11.6*  --  11.2*  --   --    < > 11.9* 10.9* 10.0*  --   HCT 39.9  --  37.8  --   --    < > 35.0* 32.0* 33.0*  --   PLT 271  --  224  --   --   --   --   --  245  --   HEPARINUNFRC  --   --  0.27*  --  0.27*  --   --   --   --  0.26*  CREATININE 1.23*  --  1.50*  --   --   --   --   --  1.76*  --   TROPONINIHS 168* 5,743*  --  >24,000*  --   --   --   --   --   --    < > = values in this interval not displayed.    Estimated Creatinine Clearance: 38 mL/min (A) (by C-G formula based on SCr of 1.76 mg/dL (H)).   Medical History: Past Medical History:  Diagnosis Date   Atypical chest pain    a. non obstructive by cath in 2008 and 2010;  b. 02/2012 Myoview : non-ischemic, EF 57%   Cellulitis    a. left foot-> s/p L BKA   Chronic kidney disease    Constipation    CVA (cerebral infarction)    a.  Small right parietal noted incidentally 04/2007;  b. right sided embolic CVA 05/2012;  c. 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 near-syncopal episode in November 2008;  b. 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   Tobacco abuse    Vertigo     Assessment: 60 YOF presenting with concern for ACS,  not on anticoagulation PTA.  Underwent cath 4/21 finding severe multivessel CAD and pulmonary hypertension. Plan to restart heparin  2 hours after TR band removed (documented on 4/21@2100 ) for medical management.    Heparin  drip 1400 uts/hr  with heparin  level 0.26 < goal cbc stable no bleeding noted  Goal of Therapy:  Heparin  level 0.3-0.7 units/ml Monitor platelets by anticoagulation protocol: Yes   Plan:  Increase heparin  infusion at 1500 units/hr  End time placed for 48hr post procedure -Monitor daily HL, CBC, and for s/sx of bleeding    Hortensia Ma Pharm.D. CPP, BCPS Clinical Pharmacist [phone] 09/08/2023 4:08 PM   Please check AMION for all [facility] phone numbers After 10:00 PM, call Main Pharmacy [phone]

## 2023-09-08 NOTE — Plan of Care (Signed)
   Problem: Clinical Measurements: Goal: Respiratory complications will improve Outcome: Progressing

## 2023-09-08 NOTE — Evaluation (Signed)
 Physical Therapy Evaluation and Discharge Patient Details Name: [name] MRN: [id number] DOB: 04-Mar-1956 Today's Date: 09/08/2023  History of Present Illness  68 y.o. female who is admitted to [facility] on 09/06/2023 with NSTEMI after presenting from SNF to [facility] ED for evaluation of hypoxia, elevated tropon, NSTEMI, heart failure, 4/21 L and R heart cath via R radial. Required BiPAP overnight.   PMH significant for chronic diastolic heart failure, type 2 diabetes mellitus complicated by diabetic peripheral polyneuropathy, essential hypertension, hyperlipidemia, chronic iron  deficiency anemia, bil BKA  Clinical Impression   Patient evaluated by Physical Therapy with no further acute PT needs identified. All education has been completed, however pt unable to recall RUE restrictions several minutes after education. Patient is a LTC resident and has not experienced a functional decline as she is lifted OOB to wheelchair and does not self-propel wheelchair.  PT is signing off. Thank you for this referral.         If plan is discharge home, recommend the following: Other (comment) (NA)   Can travel by private vehicle        Equipment Recommendations None recommended by PT  Recommendations for Other Services       Functional Status Assessment Patient has not had a recent decline in their functional status     Precautions / Restrictions Precautions Precautions: Fall;Other (comment) Recall of Precautions/Restrictions: Impaired Precaution/Restrictions Comments: Rt radial access <24 hrs      Mobility  Bed Mobility Overal bed mobility: Needs Assistance Bed Mobility: Rolling Rolling: Total assist, +2 for physical assistance, Used rails         General bed mobility comments: roll to rt only (due to unable to use RUE much post-cath), pt partially reaches, turns head with cues but does <25% of the effort to roll; unable to maintain R sidelying while holding rail; +2 total to  scoot up toward [facility]    Transfers                   General transfer comment: deferred as pt is using a lift for OOB at LTC    Ambulation/Gait                  Stairs            Wheelchair Mobility     Tilt Bed    Modified Rankin (Stroke Patients Only)       Balance                                             Pertinent Vitals/Pain Pain Assessment Pain Assessment: No/denies pain    Home Living Family/patient expects to be discharged to:: Skilled nursing facility (been at [facility] for 5 years)                   Additional Comments: does not get out of bed every day, when she does she uses lift, she does not push herself in [facility], gets assist with bed mobility, she DOES self-feed, and grooms, uses diapers for toilet, bed baths, does have prosthetics and she WAS using them to stand and take steps (10 ft) without DME (pt states it's been a long time since she did this). Pt likes to play cards (spades) Pt unreliable historian.    Prior Function Prior Level of Function : Needs assist;Patient poor historian/Family not  available             Mobility Comments: recently lift OOB almost daily to wheelchair, supposedly was transferring with Bil prosthetics but reports that it has been months       Extremity/Trunk Assessment   Upper Extremity Assessment Upper Extremity Assessment: Defer to OT evaluation    Lower Extremity Assessment Lower Extremity Assessment:  (bil BKA; R AROM WFL, L AROM lacking ~20 degrees knee extension)    Cervical / Trunk Assessment Cervical / Trunk Assessment: Other exceptions Cervical / Trunk Exceptions: overweight  Communication   Communication Communication: No apparent difficulties    Cognition Arousal: Alert Behavior During Therapy: WFL for tasks assessed/performed   PT - Cognitive impairments: History of cognitive impairments, No family/caregiver present to determine baseline                        PT - Cognition Comments: pt oriented to self, month, year, not situation Following commands: Intact       Cueing Cueing Techniques: Verbal cues, Gestural cues     General Comments General comments (skin integrity, edema, etc.): VSS on ICU monitor, pt on [place] O2    Exercises     Assessment/Plan    PT Assessment Patient does not need any further PT services  PT Problem List         PT Treatment Interventions      PT Goals (Current goals can be found in the Care Plan section)  Acute Rehab PT Goals PT Goal Formulation: All assessment and education complete, DC therapy    Frequency       Co-evaluation PT/OT/SLP Co-Evaluation/Treatment: Yes Reason for Co-Treatment: For patient/therapist safety;To address functional/ADL transfers PT goals addressed during session: Mobility/safety with mobility;Strengthening/ROM         AM-PAC PT "6 Clicks" Mobility  Outcome Measure Help needed turning from your back to your side while in a flat bed without using bedrails?: Total Help needed moving from lying on your back to sitting on the side of a flat bed without using bedrails?: Total Help needed moving to and from a bed to a chair (including a wheelchair)?: Total Help needed standing up from a chair using your arms (e.g., wheelchair or bedside chair)?: Total Help needed to walk in hospital room?: Total Help needed climbing 3-5 steps with a railing? : Total 6 Click Score: 6    End of Session Equipment Utilized During Treatment: Oxygen Activity Tolerance: Patient tolerated treatment well Patient left: in bed;with call bell/phone within reach Nurse Communication: Mobility status;Other (comment) (discussed RUE limitations until 24 hrs post-cath) PT Visit Diagnosis: Muscle weakness (generalized) (M62.81)    Time: 4098-1191 PT Time Calculation (min) (ACUTE ONLY): 23 min   Charges:   PT Evaluation $PT Eval Low Complexity: 1 Low   PT General Charges $$ ACUTE PT  VISIT: 1 Visit          Gayle Kava, PT Acute Rehabilitation Services  Office [phone]   Guilford Leep 09/08/2023, 10:56 AM

## 2023-09-08 NOTE — Progress Notes (Signed)
 Peripherally Inserted Central Catheter Placement  The IV Nurse has discussed with the patient and/or persons authorized to consent for the patient, the purpose of this procedure and the potential benefits and risks involved with this procedure.  The benefits include less needle sticks, lab draws from the catheter, and the patient may be discharged home with the catheter. Risks include, but not limited to, infection, bleeding, blood clot (thrombus formation), and puncture of an artery; nerve damage and irregular heartbeat and possibility to perform a PICC exchange if needed/ordered by physician.  Alternatives to this procedure were also discussed.  Bard Power PICC patient education guide, fact sheet on infection prevention and patient information card has been provided to patient /or left at bedside.    PICC Placement Documentation  PICC Double Lumen 09/08/23 Right Brachial 42 cm 0 cm (Active)  Indication for Insertion or Continuance of Line Prolonged intravenous therapies 09/08/23 1153  Exposed Catheter (cm) 0 cm 09/08/23 1153  Site Assessment Clean, Dry, Intact 09/08/23 1153  Lumen #1 Status Flushed;Saline locked;Blood return noted 09/08/23 1153  Lumen #2 Status Flushed;Saline locked;Blood return noted 09/08/23 1153  Dressing Type Transparent;Securing device;Other (Comment) 09/08/23 1153  Dressing Status Antimicrobial disc/dressing in place;Clean, Dry, Intact 09/08/23 1153  Line Care Connections checked and tightened 09/08/23 1153  Line Adjustment (NICU/IV Team Only) No 09/08/23 1153  Dressing Intervention New dressing;Adhesive placed at insertion site (IV team only) 09/08/23 1153  Dressing Change Due 09/15/23 09/08/23 1153       Unk Garb 09/08/2023, 11:56 AM

## 2023-09-08 NOTE — Progress Notes (Addendum)
 PROGRESS NOTE                                                                                                                                                                                                             Patient Demographics:    [name], is a 68 y.o. female, DOB - [birth year], ZOX:[id number]  Outpatient Primary MD for the patient is Arnaez Zapata, Gerardo E, MD    LOS - 2  Admit date - 09/06/2023    Chief Complaint  Patient presents with   Shortness of Breath       Brief Narrative (HPI from H&P)    [name] is a 68 y.o. female with medical history significant for chronic diastolic heart failure, type 2 diabetes mellitus complicated by diabetic peripheral polyneuropathy, essential pretension, hyperlipidemia, chronic iron  deficiency anemia with baseline hemoglobin range 9-11, who is admitted to [facility] on 09/06/2023 with NSTEMI after presenting from SNF to [facility] ED for evaluation of hypoxia.     Routine vital signs at the patient's SNF earlier today revealed oxygen saturations into the 80s on room air, in the absence of any known baseline supplemental oxygen requirements.  This prompted the patient to be brought to [facility] emergency department for further evaluation management of this new supplemental oxygen requirement.  The patient denies any associated acute symptoms, including no recent chest pain, shortness of breath, palpitations, diaphoresis, nausea, vomiting, dizziness, presyncope, or syncope.  She denies any recent subjective fever, chills, rigors, or generalized myalgias.  No new cough, hemoptysis.  Denies any recent dysuria or gross hematuria   Her medical history includes a history of chronic diastolic heart failure, with most recent echocardiogram in August 2014 showing LVEF 55 to 60% with grade 1 diastolic dysfunction.  She is not currently on any diuretic medications as an  outpatient.  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  She conveys that she is a former smoker, but denies any known history of chronic underlying pathology, including no known history of COPD.  High sensitive troponin I was initially 168, with repeat value trending up to 5743. EKG shows sinus tachycardia, ventricular trigeminy with ventricular rate 103, T wave inversion in 2, 3, aVF, V5, V6, all of which appear  new relative to most recent prior EKG from June 2015, but no evidence of ST elevation.  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  Cardiology recommends full dose aspirin  x 1 as well as heparin  drip, requests escalation of home atorvastatin  to 80 mg p.o. daily, checking of lipid panel, and for the patient to be kept n.p.o., with the possibility of coronary angiography on 09/07/2023.    Subjective:    [name] was evaluated at the bed side. Evaluated after eating some of her lunch, family at bedside. Reports feeling much better compared to yesterday. Continues to deny any chest pain, nausea or vomiting. Updated patient and family on heart catheter results and plan for medical therapy.   Assessment  & Plan :   Assessment and Plan:  # NSTEMI # Hx of nonobstructive CAD - Shortness of breath on presentation but no chest pain, SOB improved - Troponin up trended to >24K from 5K on admission - Heart cath on 4/21 showed severe multivessel CAD - Transferred to the ICU after heart cath, remains on heparin  drip - Per cardiology, pt not a CABG candidate with no good target for PCI. - Plan for medical therapy with DAPT and high intensity  statin - Telemetry  # CHF exacerbation # Acute on chronic systolic and diastolic heart failure - History of diastolic heart failure - Repeat TTE showed EF 20-25%, G2DD, RV mildly reduced, RVSP 49 mmHg, moderate TR  and mild to moderate MR - Cardiology following, appreciate recs - Shortness of breath much improved today, down to 2 L [place] - Continue IV Lasix , metolazone  5 mg x1 today, starting spironolactone  25 mg daily - Continue on supplemental O2 - Strict I&O's, daily weights  # T2DM - A1c of 7.3% - CBGs improved to the 120s to 170s - Continue Lantus  15 units twice daily for now - SSI with meals, CBG monitoring  # AKI on CKD 3A - Bump in creatinine from 1.23 on admission to 1.76 - Likely secondary to fluid overloaded state - Continue IV diuresing as above  # HTN - BP stable with SBP in the 110s to 120s - Holding Toprol  XL, starting spironolactone   # HLD - Continue atorvastatin  - Follow-up lipoprotein a  # Iron  deficiency anemia - Hemoglobin stable at 10.0 - Trend CBC  # Bilateral amputee - Currently resides in [facility] as a LTC pt - PT/OT eval-treat - Fall precautions  # Class III Obesity: Estimated body mass index is 38.32 kg/m as calculated from the following:   Height as of this encounter: 5\' 6"  (1.676 m).   Weight as of this encounter: 107.7 kg.   # Pressure Injury - Pressure injury sacrum stage 1, present on admission at the time of the admission order - Continue wound care       Condition -stable  Family Communication  : Discussed plan with multiple family members in her room  Code Status : Full  Consults  : Cardiology  PUD Prophylaxis : None   Procedures  :     4/21: Left and right heart cath      Disposition Plan  :    Status is: Inpatient Remains inpatient appropriate because: Management of NSTEMI  DVT Prophylaxis  :    SCDs Start: 09/06/23 2052  Lab Results  Component Value Date   PLT 245 09/08/2023    Diet :   Diet Order             Diet heart healthy/carb modified Fluid consistency: Thin; Fluid restriction: 1800 mL Fluid  Diet effective now                    Inpatient Medications  Scheduled Meds:  aspirin   81 mg Oral Daily   atorvastatin   80 mg Oral Daily   Chlorhexidine  Gluconate Cloth  6 each Topical Daily   clopidogrel   75 mg Oral Daily   ferrous sulfate   325 mg Oral BID WC   insulin  aspart  0-15 Units Subcutaneous Q4H   insulin  glargine-yfgn  15 Units Subcutaneous BID   ipratropium-albuterol   3 mL Nebulization Once   sodium chloride  flush  10-40 mL Intracatheter Q12H   sodium chloride  flush  3 mL Intravenous Q12H   spironolactone   25 mg Oral Daily   Continuous Infusions:  sodium chloride      furosemide  160 mg (09/08/23 1002)   heparin  1,400 Units/hr (09/08/23 0700)   PRN Meds:.sodium chloride , acetaminophen  **OR** acetaminophen , melatonin, ondansetron  (ZOFRAN ) IV, sodium chloride  flush, sodium chloride  flush  Antibiotics  :    Anti-infectives (From admission, onward)    None         Objective:   Vitals:   09/08/23 0900 09/08/23 1000 09/08/23 1100 09/08/23 1200  BP: (!) 125/90 (!) 129/91 (!) 121/52 (!) 114/56  Pulse: 88 91 100 92  Resp: (!) 24 (!) 31 (!) 35 (!) 34  Temp:      TempSrc:      SpO2: 95% 95% 95% 91%  Weight:      Height:        Wt Readings from Last 3 Encounters:  09/08/23 107.7 kg  07/22/21 110.2 kg  04/05/19 110.2 kg     Intake/Output Summary (Last 24 hours) at 09/08/2023 1332 Last data filed at 09/08/2023 1000 Gross per 24 hour  Intake 353.78 ml  Output 1195 ml  Net -841.22 ml     Physical Exam  General: Pleasant, chronically ill appearing obese woman laying in bed. No acute distress. HEENT: [place]/AT. Anicteric sclera. MMM. Neck: JVD difficult to assess due to body habitus CV: Regular rate. Regular rhythm. No murmurs, rubs, or gallops.  Pulmonary: On 2 L [place]. Normal effort. Mild expiratory wheezes. Abdominal: Soft, NT/ND. Mild  abdominal wall edema. Normal bowel sounds. Extremities: Bilateral below-knee amputation Skin: Warm and dry. No obvious rash or lesions. Neuro: A&Ox3. Moves all extremities. Normal sensation to light touch.     RN pressure injury documentation: Pressure Injury 09/07/23 Sacrum Right;Left;Mid Stage 1 -  Intact skin with non-blanchable redness of a localized area usually over a bony prominence. (Active)  09/07/23 0020  Location: Sacrum  Location Orientation: Right;Left;Mid  Staging: Stage 1 -  Intact skin with non-blanchable redness of a localized area usually over a bony prominence.  Wound Description (Comments):   Present on Admission: Yes  Dressing Type Foam - Lift dressing to assess site every shift 09/08/23 0700      Data Review:    Recent Labs  Lab 09/06/23 1442 09/07/23 0212 09/07/23 1815 09/07/23 1822 09/07/23 1823 09/07/23 1844 09/08/23 0325  WBC 17.8* 15.2*  --   --   --   --  12.2*  HGB 11.6* 11.2* 11.9* 12.2 11.9* 10.9* 10.0*  HCT 39.9 37.8 35.0* 36.0 35.0* 32.0* 33.0*  PLT 271 224  --   --   --   --  245  MCV 87.9 85.3  --   --   --   --  83.8  MCH 25.6* 25.3*  --   --   --   --  25.4*  MCHC 29.1* 29.6*  --   --   --   --  30.3  RDW 19.9* 19.5*  --   --   --   --  19.0*  LYMPHSABS 1.4  --   --   --   --   --   --   MONOABS 0.4  --   --   --   --   --   --   EOSABS 0.1  --   --   --   --   --   --   BASOSABS 0.1  --   --   --   --   --   --     Recent Labs  Lab 09/06/23 1404 09/06/23 1442 09/06/23 1442 09/07/23 0211 09/07/23 0212 09/07/23 0856 09/07/23 1815 09/07/23 1822 09/07/23 1823 09/07/23 1844 09/08/23 0325  NA  --  139   < >  --  140  --  142 141 140 140 139  K  --  4.7   < >  --  4.7  --  4.6 4.7 4.5 4.6 4.3  CL  --  110  --   --  106  --   --   --   --   --  104  CO2  --  19*  --   --  22  --   --   --   --   --  24  ANIONGAP  --  10  --   --  12  --   --   --   --   --  11  GLUCOSE  --  278*  --   --  221*  --   --   --   --   --  168*   BUN  --  18  --   --  24*  --   --   --   --   --  30*  CREATININE  --  1.23*  --   --  1.50*  --   --   --   --   --  1.76*  AST  --  32  --   --  112*  --   --   --   --   --   --   ALT  --  18  --   --  37  --   --   --   --   --   --   ALKPHOS  --  106  --   --  129*  --   --   --   --   --   --   BILITOT  --  1.3*  --   --  0.7  --   --   --   --   --   --   ALBUMIN   --  2.0*  --   --  2.6*  --   --   --   --   --   --   PROCALCITON  --   --   --  0.10  --   --   --   --   --   --   --   HGBA1C  --   --   --   --  7.3*  --   --   --   --   --   --   BNP 359.9*  --   --   --   --  1,077.5*  --   --   --   --   --   MG  --   --   --   --  1.9  --   --   --   --   --   --   CALCIUM   --  6.8*  --   --  8.0*  --   --   --   --   --  7.6*   < > = values in this interval not displayed.      Recent Labs  Lab 09/06/23 1404 09/06/23 1442 09/07/23 0211 09/07/23 0212 09/07/23 0856 09/08/23 0325  PROCALCITON  --   --  0.10  --   --   --   HGBA1C  --   --   --  7.3*  --   --   BNP 359.9*  --   --   --  1,077.5*  --   MG  --   --   --  1.9  --   --   CALCIUM   --  6.8*  --  8.0*  --  7.6*    --------------------------------------------------------------------------------------------------------------- Lab Results  Component Value Date   CHOL 127 09/07/2023   HDL 40 (L) 09/07/2023   LDLCALC 61 09/07/2023   TRIG 132 09/07/2023   CHOLHDL 3.2 09/07/2023    Lab Results  Component Value Date   HGBA1C 7.3 (H) 09/07/2023   No results for input(s): "TSH", "T4TOTAL", "FREET4", "T3FREE", "THYROIDAB" in the last 72 hours. No results for input(s): "VITAMINB12", "FOLATE", "FERRITIN", "TIBC", "IRON ", "RETICCTPCT" in the last 72 hours. ------------------------------------------------------------------------------------------------------------------ Cardiac Enzymes No results for input(s): "CKMB", "TROPONINI", "MYOGLOBIN" in the last 168 hours.  Invalid input(s): "CK"  Micro  Results Recent Results (from the past 240 hours)  Resp panel by RT-PCR (RSV, Flu A&B, Covid) Anterior Nasal Swab     Status: None   Collection Time: 09/06/23  2:05 PM   Specimen: Anterior Nasal Swab  Result Value Ref Range Status   SARS Coronavirus 2 by RT PCR NEGATIVE NEGATIVE Final   Influenza A by PCR NEGATIVE NEGATIVE Final   Influenza B by PCR NEGATIVE NEGATIVE Final    Comment: (NOTE) The Xpert Xpress SARS-CoV-2/FLU/RSV plus assay is intended as an aid in the diagnosis of influenza from Nasopharyngeal swab specimens and should not be used as a sole basis for treatment. Nasal washings and aspirates are unacceptable for Xpert Xpress SARS-CoV-2/FLU/RSV testing.  Fact Sheet for Patients: BloggerCourse.com  Fact Sheet for Healthcare Providers: SeriousBroker.it  This test is not yet approved or cleared by the [place]  FDA and has been authorized for detection and/or diagnosis of SARS-CoV-2 by FDA under an Emergency Use Authorization (EUA). This EUA will remain in effect (meaning this test can be used) for the duration of the COVID-19 declaration under Section 564(b)(1) of the Act, 21 U.S.C. section 360bbb-3(b)(1), unless the authorization is terminated or revoked.     Resp Syncytial Virus by PCR NEGATIVE NEGATIVE Final    Comment: (NOTE) Fact Sheet for Patients: BloggerCourse.com  Fact Sheet for Healthcare Providers: SeriousBroker.it  This test is not yet approved or cleared by the [place]  FDA and has been authorized for detection and/or diagnosis of SARS-CoV-2 by FDA under an Emergency Use Authorization (EUA). This EUA will remain  in effect (meaning this test can be used) for the duration of the COVID-19 declaration under Section 564(b)(1) of the Act, 21 U.S.C. section 360bbb-3(b)(1), unless the authorization is terminated or revoked.  Performed at [facility], [address] [place], [place] [address]   MRSA Next Gen by PCR, Nasal     Status: None   Collection Time: 09/07/23  1:10 AM   Specimen: Nasal Mucosa; Nasal Swab  Result Value Ref Range Status   MRSA by PCR Next Gen NOT DETECTED NOT DETECTED Final    Comment: (NOTE) The GeneXpert MRSA Assay (FDA approved for NASAL specimens only), is one component of a comprehensive MRSA colonization surveillance program. It is not intended to diagnose MRSA infection nor to guide or monitor treatment for MRSA infections. Test performance is not FDA approved in patients less than 69 years old. Performed at [facility], [address] [place], [place] [address]   MRSA Next Gen by PCR, Nasal     Status: None   Collection Time: 09/07/23  7:32 PM   Specimen: Nasal Mucosa; Nasal Swab  Result Value Ref Range Status   MRSA by PCR Next Gen NOT DETECTED NOT DETECTED Final    Comment: (NOTE) The GeneXpert MRSA Assay (FDA approved for NASAL specimens only), is one component of a comprehensive MRSA colonization surveillance program. It is not intended to diagnose MRSA infection nor to guide or monitor treatment for MRSA infections. Test performance is not FDA approved in patients less than 72 years old. Performed at [facility], [address] [place], [place] [address]     Radiology Reports US  EKG SITE RITE Result Date: 09/08/2023 If [facility] image not attached, placement could not be confirmed due to current cardiac rhythm.  CARDIAC CATHETERIZATION Addendum Date: 09/07/2023   Ost RCA to Prox RCA lesion is 90% stenosed. Prox RCA lesion is 50% stenosed. Mid RCA to Dist RCA lesion is 100% stenosed.  (RPDA and RPL 1 fills via left-to-right collaterals)   Ost Cx to Mid Cx lesion is 60% stenosed with 70% stenosed side branch in 1st Mrg.   3rd Mrg lesion is 50% stenosed. Lat 3rd Mrg lesion is 80% stenosed.   1st LPL lesion is 100% stenosed.   Ramus-1 lesion is 20% stenosed.   Ramus-2  lesion is 90% stenosed.   Ost LAD to Prox LAD lesion is 30% stenosed.   Prox LAD to Mid LAD lesion is 80% stenosed.   Mid LAD lesion is 40% stenosed.   Hemodynamic findings consistent with severe pulmonary hypertension.   Recent IV heparin  2 hours post TR band removal Patient likely NOT a good CVTS consultation candidate and therefore would plan to treat with DAPT Dominance: Right Severe multivessel CAD: Unclear culprit lesion 90% ostial RCA followed by 50% segmental stenosis and then 100% CTO after small marginal branch (PDA and PL fill via left-to-right collaterals) LM trifurcates into LAD, RI and small caliber LCx: Proximal LAD 20% followed by 80% focal heavily calcified lesion in a small diagonal branch, there is another 40% mid LAD stenosis but the rest the LAD is relatively free of disease just calcified RI is the same caliber as the LAD with 20% proximal stenosis followed by segment 90% stenosis and then the rest of the vessel is free of disease Small caliber LCx which gives off tiny OM1 and terminates as a bifurcating OM 2 that gives off a sidebranch, the AV groove circumflex has LPL 1 occluded  and then 2 small LPL branches. Severe Pulmonary Hypertension-with a significant portion Of WHO Class II component (Acute Combined Systolic and Diastolic Heart Failure): Mean PAP 44 mmHg with a PCWP of 34 mmHg.  (Unable to cross the aortic valve due to difficulty advancing the catheter due to radial spasm.) PAP-mean 54/42-44 mmHg; PCWP 34 mmHg AO sat 100%, PA sat 53%.  Cardiac Output/Index by Albertina Hugger 4.09-1.86 RECOMMENDATIONS Plan is to transfer the patient to CVICU for ongoing care with advanced heart failure team consulting for helping out with treatment of heart failure.   Recommend uninterrupted dual antiplatelet therapy with Aspirin  81mg  daily and Clopidogrel  75mg  daily for a minimum of 12 months (ACS-Class I recommendation).   Recent IV heparin  2 hours post TR band removal - Patient is likely NOT a good CVTS  consultation candidate for CABG given co-morbidities & Bilateral amputee.  Would treat with aspirin  / Plavix  DAPT. Load Plavix  300 mg Plavix  today. Will need to discuss with the remainder of the ICU team as to potential options for PCI would most likely targets being RI and LAD lesions that would likely require to minimum shockwave lithotripsy.  Major issue would be access for catheter advancement as the radial access will probably not tolerate 6 [place] guide catheter. Restart IV heparin  2 hours after TR band removal Randene Bustard, MD   Result Date: 09/07/2023 Images from the original result were not included.   Ost RCA to Prox RCA lesion is 90% stenosed. Prox RCA lesion is 50% stenosed. Mid RCA to Dist RCA lesion is 100% stenosed.  (RPDA and RPL 1 fills via left-to-right collaterals)   Ost Cx to Mid Cx lesion is 60% stenosed with 70% stenosed side branch in 1st Mrg.   3rd Mrg lesion is 50% stenosed. Lat 3rd Mrg lesion is 80% stenosed.   1st LPL lesion is 100% stenosed.   Ramus-1 lesion is 20% stenosed.   Ramus-2 lesion is 90% stenosed.   Ost LAD to Prox LAD lesion is 30% stenosed.   Prox LAD to Mid LAD lesion is 80% stenosed.   Mid LAD lesion is 40% stenosed.   Hemodynamic findings consistent with severe pulmonary hypertension. Dominance: Right Severe multivessel CAD: Unclear culprit lesion 90% ostial RCA followed by 50% segmental stenosis and then 100% CTO after small marginal branch (PDA and PL fill via left-to-right collaterals) LM trifurcates into LAD, RI and small caliber LCx: Proximal LAD 20% followed by 80% focal heavily calcified lesion in a small diagonal branch, there is another 40% mid LAD stenosis but the rest the LAD is relatively free of disease just calcified RI is the same caliber as the LAD with 20% proximal stenosis followed by segment 90% stenosis and then the rest of the vessel is free of disease Small caliber LCx which gives off tiny OM1 and terminates as a bifurcating OM 2 that gives off a  sidebranch, the AV groove circumflex has LPL 1 occluded and then 2 small LPL branches. Severe Pulmonary Hypertension-with a significant portion Of WHO Class II component (Acute Combined Systolic and Diastolic Heart Failure): Mean PAP 44 mmHg with a PCWP of 34 mmHg.  (Unable to cross the aortic valve due to difficulty advancing the catheter due to radial spasm.) PAP-mean 54/42-44 mmHg; PCWP 34 mmHg AO sat 100%, PA sat 53%.  Cardiac Output/Index by Albertina Hugger 4.09-1.86 RECOMMENDATIONS Plan is to transfer the patient to CVICU for ongoing care with advanced heart failure team consulting for helping out with treatment of heart failure.  Recommend uninterrupted dual antiplatelet therapy with Aspirin  81mg  daily and Clopidogrel  75mg  daily for a minimum of 12 months (ACS-Class I recommendation).   Recent IV heparin  2 hours post TR band removal Patient likely a good CVTS consultation candidate and therefore would strongly consider aspirin  Plavix  Will need to discuss with the remainder of the ICU team as to potential options for PCI would most likely targets being RI and LAD lesions that would likely require to minimum shockwave lithotripsy.  Major issue would be access for catheter advancement as the radial access will probably not tolerate 6 [place] guide catheter. Restart IV heparin  2 hours after TR band removal Randene Bustard, MD  ECHOCARDIOGRAM COMPLETE Result Date: 09/07/2023    ECHOCARDIOGRAM REPORT   Patient Name:   [name] Date of Exam: 09/07/2023 Medical Rec #:  [id number]        Height:       66.0 in Accession #:    [id number]       Weight:       250.2 lb Date of Birth:  1956-04-17         BSA:          2.200 m Patient Age:    67 years         BP:           133/79 mmHg Patient Gender: F                HR:           90 bpm. Exam Location:  Inpatient Procedure: 2D Echo, Color Doppler, Cardiac Doppler and Intracardiac            Opacification Agent (Both Spectral and Color Flow Doppler were            utilized during  procedure). Indications:    Elevated Troponins  History:        Patient has prior history of Echocardiogram examinations, most                 recent 01/10/2013. CHF, CAD; Risk Factors:Hypertension, Diabetes                 and Dyslipidemia.  Sonographer:    Sherline Distel Senior RDCS Referring Phys: [id number] Roxana Copier  Sonographer Comments: Technically difficult due to patient body habitus IMPRESSIONS  1. LVEF is difficult to quantify due to beat to beat variability (trigeminy throughout study). Left ventricular ejection fraction, by estimation, is 20 to 25%. The left ventricle has severely decreased function. The left ventricle demonstrates global hypokinesis. There is mild concentric left ventricular hypertrophy. Left ventricular diastolic parameters are consistent with Grade II diastolic dysfunction (pseudonormalization).  2. Right ventricular systolic function is mildly reduced. The right ventricular size is normal. There is moderately elevated pulmonary artery systolic pressure. The estimated right ventricular systolic pressure is 49.2 mmHg.  3. Left atrial size was mildly dilated.  4. The mitral valve is normal in structure. Mild to moderate mitral valve regurgitation.  5. Tricuspid valve regurgitation is moderate.  6. The aortic valve is tricuspid. There is mild calcification of the aortic valve. Aortic valve regurgitation is not visualized.  7. The inferior vena cava is dilated in size with >50% respiratory variability, suggesting right atrial pressure of 8 mmHg.  8. Cannot exclude a small PFO. FINDINGS  Left Ventricle: LVEF is difficult to quantify due to beat to beat variability (trigeminy throughout study). Left ventricular ejection fraction, by estimation, is 20 to 25%. The left  ventricle has severely decreased function. The left ventricle demonstrates global hypokinesis. Definity  contrast agent was given IV to delineate the left ventricular endocardial borders. The left ventricular internal cavity size  was normal in size. There is mild concentric left ventricular hypertrophy. Left ventricular diastolic parameters are consistent with Grade II diastolic dysfunction (pseudonormalization). Right Ventricle: The right ventricular size is normal. No increase in right ventricular wall thickness. Right ventricular systolic function is mildly reduced. There is moderately elevated pulmonary artery systolic pressure. The tricuspid regurgitant velocity is 3.21 m/s, and with an assumed right atrial pressure of 8 mmHg, the estimated right ventricular systolic pressure is 49.2 mmHg. Left Atrium: Left atrial size was mildly dilated. Right Atrium: Right atrial size was normal in size. Pericardium: Trivial pericardial effusion is present. Mitral Valve: The mitral valve is normal in structure. Mild to moderate mitral valve regurgitation. Tricuspid Valve: The tricuspid valve is normal in structure. Tricuspid valve regurgitation is moderate. Aortic Valve: The aortic valve is tricuspid. There is mild calcification of the aortic valve. Aortic valve regurgitation is not visualized. Pulmonic Valve: The pulmonic valve was normal in structure. Pulmonic valve regurgitation is trivial. Aorta: The aortic root and ascending aorta are structurally normal, with no evidence of dilitation. Venous: The inferior vena cava is dilated in size with greater than 50% respiratory variability, suggesting right atrial pressure of 8 mmHg. IAS/Shunts: Cannot exclude a small PFO.  LEFT VENTRICLE PLAX 2D LVIDd:         4.80 cm LVIDs:         4.50 cm LV PW:         1.00 cm LV IVS:        0.90 cm LVOT diam:     2.00 cm LV SV:         39 LV SV Index:   18 LVOT Area:     3.14 cm  RIGHT VENTRICLE RV S prime:     7.90 cm/s TAPSE (M-mode): 1.6 cm LEFT ATRIUM             Index        RIGHT ATRIUM           Index LA diam:        4.30 cm 1.95 cm/m   RA Area:     11.90 cm LA Vol (A2C):   54.0 ml 24.55 ml/m  RA Volume:   26.20 ml  11.91 ml/m LA Vol (A4C):   68.8 ml  31.28 ml/m LA Biplane Vol: 67.7 ml 30.78 ml/m  AORTIC VALVE LVOT Vmax:   75.05 cm/s LVOT Vmean:  50.050 cm/s LVOT VTI:    0.124 m  AORTA Ao Root diam: 2.90 cm Ao Asc diam:  3.20 cm TRICUSPID VALVE TR Peak grad:   41.2 mmHg TR Vmax:        321.00 cm/s  SHUNTS Systemic VTI:  0.12 m Systemic Diam: 2.00 cm Jules Oar MD Electronically signed by Jules Oar MD Signature Date/Time: 09/07/2023/11:22:18 AM    Final    CT Angio Chest PE W and/or Wo Contrast Result Date: 09/06/2023 CLINICAL DATA:  Shortness of breath EXAM: CT ANGIOGRAPHY CHEST WITH CONTRAST TECHNIQUE: Multidetector CT imaging of the chest was performed using the standard protocol during bolus administration of intravenous contrast. Multiplanar CT image reconstructions and MIPs were obtained to evaluate the vascular anatomy. RADIATION DOSE REDUCTION: This exam was performed according to the departmental dose-optimization program which includes automated exposure control, adjustment of the mA and/or kV according to patient  size and/or use of iterative reconstruction technique. CONTRAST:  75mL OMNIPAQUE  IOHEXOL  350 MG/ML SOLN COMPARISON:  Chest x-ray 09/06/2023 CT report 06/09/2018 FINDINGS: Cardiovascular: Satisfactory opacification of the pulmonary arteries to the segmental level. No evidence of pulmonary embolism. Moderate aortic atherosclerosis. No aneurysm. Coronary vascular calcification. Mild cardiomegaly. Small pericardial effusion Mediastinum/Nodes: Patent trachea. No thyroid  mass. Subcentimeter mediastinal lymph nodes. Prominent right hilar node measuring 16 mm. Esophagus within normal limits Lungs/Pleura: Small bilateral pleural effusions. Generalized hazy pulmonary densities suspected to represent edema. Mild passive atelectasis in the lower lobes Upper Abdomen: No acute finding partially visualized 2.1 cm left adrenal nodule, stable per prior report Musculoskeletal: Multilevel degenerative changes. No acute osseous abnormality Review  of the MIP images confirms the above findings. IMPRESSION: 1. Negative for acute pulmonary embolus. 2. Cardiomegaly with small bilateral pleural effusions. Diffuse hazy pulmonary density probably represents mild pulmonary edema 3. Mildly prominent right hilar lymph node, nonspecific Aortic Atherosclerosis (ICD10-I70.0). Electronically Signed   By: Esmeralda Hedge M.D.   On: 09/06/2023 19:39   DG Chest Portable 1 View Result Date: 09/06/2023 CLINICAL DATA:  Shortness of breath EXAM: PORTABLE CHEST 1 VIEW COMPARISON:  10/29/2013 FINDINGS: Low lung volumes. Cardiomegaly with mild central congestion. No pleural effusion, focal airspace disease or pneumothorax. IMPRESSION: Cardiomegaly with mild central congestion. Electronically Signed   By: Esmeralda Hedge M.D.   On: 09/06/2023 16:11      Signature  -   Vita Grip M.D on 09/08/2023 at 1:32 PM   -  To page go to [email or web address]

## 2023-09-08 NOTE — Consult Note (Signed)
 Advanced Heart Failure Team Consult Note   Primary Physician: Arnaez Zapata, Gerardo E, MD Cardiologist:  None  Reason for Consultation: Acute systolic CHF  HPI:    [name] is seen today for evaluation of acute systolic CHF at the request of Dr. Addie Holstein with [facility]. 68 y.o. female with history of nonobstructive CAD, HF with recovered EF, hx CVA, insulin  dependent DM, CKD IIIa, hx b/l BKA.   [facility] ED [facility] with hypoxia. O2 sats in 80s on RA. HS troponin [id number]. BNP 359. ECG with SR, frequent PVCs, inferolateral ST-T depression. CTA chest with no PE but did reveal evidence of pulmonary edema and b/l pleural effusions. She was admitted for further workup/management and Cardiology consulted for NSTEMI.   Echo with EF 20-25%, grade II DD, RV mildly reduced, RVSP 49 mmHg, moderate TR, dilated IVC  R/LHC yesterday evening: 90% ostial to proximal RCA, 100% m to d RCA, 60% ostial to m LCX, 70% OM1, 80% lat 3rd marg, 100% 1st LPL, 90% ramus 2, 80% p to m LAD, PA mean 44, PCWP 34, Fick CI 1.86. Cath difficult, additional measurements not obtained. She was transferred to ICU for management of low-output HF and respiratory failure requiring BiPAP. Not candidate for CABG. Images reviewed with IC, med management for CAD.  Now off BiPAP, stable on 2L O2. ~ 1L UOP with 160 mg IV lasix .  Home Medications Prior to Admission medications   Medication Sig Start Date End Date Taking? Authorizing Provider  albuterol  (VENTOLIN  HFA) 108 (90 Base) MCG/ACT inhaler Inhale 2 puffs into the lungs in the morning and at bedtime.   Yes [provider]  amLODipine  (NORVASC ) 2.5 MG tablet Take 2.5 mg by mouth daily. 06/14/18  Yes [provider]  aspirin  EC 81 MG tablet Take 81 mg by mouth daily.   Yes [provider]  atorvastatin  (LIPITOR ) 40 MG tablet Take 40 mg by mouth daily. 08/23/15  Yes [provider]  chlorhexidine  (PERIDEX ) 0.12 %  solution Use as directed 5 mLs in the mouth or throat 2 (two) times daily.   Yes [provider]  Dulaglutide 3 MG/0.5ML SOAJ Inject 4.5 mg into the skin. Every Wednesday   Yes [provider]  ferrous sulfate  325 (65 FE) MG tablet Take 325 mg by mouth 2 (two) times daily.   Yes [provider]  gabapentin (NEURONTIN) 400 MG capsule Take 800 mg by mouth daily. 06/02/18  Yes [provider]  gabapentin (NEURONTIN) 600 MG tablet Take 600 mg by mouth in the morning.   Yes [provider]  insulin  lispro (HUMALOG) 100 UNIT/ML injection Inject 4-14 Units into the skin in the morning and at bedtime. 150-200=4 units,201-250 = 6 units, 251-300 = 8 units, 301-350= 10 units, 351-400= 10 units, 401-450=14 units, 451-500 =12 units >500 = 14 units   Yes [provider]  LANTUS  100 UNIT/ML injection Inject 38 Units into the skin 2 (two) times daily. 08/23/15  Yes [provider]  Loperamide HCl (IMODIUM PO) Take 2 mLs by mouth every 8 (eight) hours as needed (diarrhea). 2mg /61ml   Yes [provider]  loratadine (CLARITIN) 10 MG tablet Take 10 mg by mouth daily.   Yes [provider]  magnesium  hydroxide (MILK OF MAGNESIA) 400 MG/5ML suspension Take 30 mLs by mouth daily as needed for mild constipation.   Yes [provider]  metFORMIN  (GLUCOPHAGE ) 500 MG tablet Take 500 mg by mouth daily.  07/13/23  Yes [provider]  metoprolol  succinate (TOPROL -XL) 50 MG 24 hr tablet Take 50 mg by mouth daily. 07/13/23  Yes [provider]  nystatin (MYCOSTATIN/NYSTOP) powder Apply 1 Application topically 3 (three) times daily. Apply to skin folds/ R side neck topically threes times a day for candidiasis. Apply to reddened areas on inner elbows and abdominal fold/R side of neck   Yes [provider]  PAZEO 0.7 % SOLN Place 1 drop into both eyes daily.  05/27/18  Yes [provider]  polyethylene glycol (MIRALAX   / GLYCOLAX ) packet Take 17 g by mouth daily as needed for moderate constipation. Patient taking differently: Take 17 g by mouth every other day. 11/04/13  Yes Rizwan, Saima, MD  senna-docusate (SENOKOT-S) 8.6-50 MG tablet Take 1 tablet by mouth daily.   Yes [provider]  Sodium Fluoride 1.1 % PSTE Place 1 Application onto teeth at bedtime.   Yes [provider]  Tetrahydrozoline HCl (VISINE OP) Place 1 drop into both eyes at bedtime.   Yes [provider]  vitamin C  (ASCORBIC ACID) 500 MG tablet Take 500 mg by mouth daily.   Yes [provider]  atorvastatin  (LIPITOR ) 20 MG tablet Take 20 mg by mouth daily. Patient not taking: Reported on 09/06/2023 05/22/18   [provider]  lisinopril -hydrochlorothiazide  (ZESTORETIC) 20-12.5 MG tablet Take 2 tablets by mouth daily. Patient not taking: Reported on 09/06/2023    [provider]    Past Medical History: Past Medical History:  Diagnosis Date   Atypical chest pain    a. non obstructive by cath in 2008 and 2010;  b. 02/2012 Myoview : non-ischemic, EF 57%   Cellulitis    a. left foot-> s/p L BKA   Chronic kidney disease    Constipation    CVA (cerebral infarction)    a.  Small right parietal noted incidentally 04/2007;  b. right sided embolic CVA 05/2012;  c. TEE 2/14:  LVH, EF 55-60%, mild LAE, no LAA clot, no PFO, no R->L shunt by echo contrast, oscillating density on AV likely Lambl's Excressence    Diabetes mellitus, type II (HCC)    Diabetic retinopathy (HCC)    NPDR w/edema OU   Diaphragmatic hernia without mention of obstruction or gangrene    Dry skin    Esophageal reflux    HTN (hypertension)    Hx of BKA (HCC)    Bilateral   Hyperlipidemia    Irritable bowel syndrome    Morbid obesity (HCC)    Obesity, unspecified    Pain in shoulder 06/28/2015   Peripheral neuropathy    Stroke [facility])    2013-'no residual'   Syncope and collapse    a. near-syncopal episode in November 2008;   b. 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   Tobacco abuse    Vertigo     Past Surgical History: Past Surgical History:  Procedure Laterality Date   AMPUTATION  12/30/2011   Procedure: AMPUTATION RAY;  Surgeon: Amada Backer, MD;  Location: [facility] OR;  Service: Orthopedics;  Laterality: Left;  LEFT FIRST RAY AMPUTATION   AMPUTATION  01/13/2012   Procedure: AMPUTATION BELOW KNEE;  Surgeon: Amada Backer, MD;  Location: [facility] OR;  Service: Orthopedics;  Laterality: Left;   AMPUTATION  03/04/2012   Procedure: AMPUTATION BELOW KNEE;  Surgeon: Amada Backer, MD;  Location: [facility] OR;  Service: Orthopedics;  Laterality: Left;  Revision of Left Below Knee Amputation   AMPUTATION Right 11/02/2013   Procedure: AMPUTATION BELOW KNEE;  Surgeon: Timothy Ford, MD;  Location: [facility] OR;  Service: Orthopedics;  Laterality: Right;  Right Below Knee Amputation   BIOPSY  03/29/2019   Procedure: BIOPSY;  Surgeon: Annis Kinder, DO;  Location: [facility] ENDOSCOPY;  Service: Gastroenterology;;  EGD and Colon   CARDIAC CATHETERIZATION     LAD 30%, circumflex 50%, OM 75%, RI 60% with small branch 80%, dominant RCA 60%, EF 45-50%   CHOLECYSTECTOMY     COLONOSCOPY     COLONOSCOPY WITH PROPOFOL  N/A 03/29/2019   Procedure: COLONOSCOPY WITH PROPOFOL ;  Surgeon: Annis Kinder, DO;  Location: [facility] ENDOSCOPY;  Service: Gastroenterology;  Laterality: N/A;   ESOPHAGOGASTRODUODENOSCOPY (EGD) WITH PROPOFOL  N/A 03/29/2019   Procedure: ESOPHAGOGASTRODUODENOSCOPY (EGD) WITH PROPOFOL ;  Surgeon: Annis Kinder, DO;  Location: [facility] ENDOSCOPY;  Service: Gastroenterology;  Laterality: N/A;   EYE SURGERY Right    cataract   I & D EXTREMITY  12/23/2011   Procedure: IRRIGATION AND DEBRIDEMENT EXTREMITY;  Surgeon: Lorriane Rote, MD;  Location: [facility] OR;  Service: Orthopedics;  Laterality: Left;  Left Foot   I & D EXTREMITY  12/26/2011   Procedure: IRRIGATION AND DEBRIDEMENT EXTREMITY;  Surgeon: Amada Backer, MD;  Location: [facility] OR;  Service: Orthopedics;   Laterality: Left;  LEFT FOOT I&D WITH POSSIBLE WOUND VAC APPLICATION, POSSIBLE LEFT FIRST RAY AMPUTATION   I & D EXTREMITY Right 10/29/2013   Procedure: IRRIGATION AND DEBRIDEMENT EXTREMITY;  Surgeon: Loel Ring, MD;  Location: [facility] OR;  Service: Orthopedics;  Laterality: Right;   Invasive Electrophysiologic Study  5/09   followed by insertion of an implantable loop recorder. S/p removal    Multiple Toe Surgeries     POLYPECTOMY  03/29/2019   Procedure: POLYPECTOMY;  Surgeon: Annis Kinder, DO;  Location: [facility] ENDOSCOPY;  Service: Gastroenterology;;   RIGHT/LEFT HEART CATH AND CORONARY ANGIOGRAPHY N/A 09/07/2023   Procedure: RIGHT/LEFT HEART CATH AND CORONARY ANGIOGRAPHY;  Surgeon: Arleen Lacer, MD;  Location: [facility] INVASIVE CV LAB;  Service: Cardiovascular;  Laterality: N/A;   TEE WITHOUT CARDIOVERSION N/A 07/07/2012   Procedure: TRANSESOPHAGEAL ECHOCARDIOGRAM (TEE);  Surgeon: Lenise Quince, MD;  Location: [facility] ENDOSCOPY;  Service: Cardiovascular;  Laterality: N/A;    Family History: Family History  Problem Relation Age of Onset   Pneumonia Mother    Gallbladder disease Mother        cancer   Heart failure Mother    Diabetes Father    Coronary artery disease Father    Stroke Neg Hx     Social History: Social History   Socioeconomic History   Marital status: Single    Spouse name: Not on file   Number of children: Not on file   Years of education: Not on file   Highest education level: Not on file  Occupational History   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   Drug  use: No   Sexual activity: Not Currently    Birth control/protection: Post-menopausal  Other Topics Concern   Not on file  Social History Narrative   Lives at [facility] since 12/19/14     Does not routinely exercise.     works as Pension scheme manager.    >25 pack year history.    FULL CODE   Social Drivers of Corporate investment banker Strain: Not on file  Food Insecurity: No Food Insecurity (09/08/2023)   Hunger Vital Sign    Worried About Running Out of Food in the Last Year: Never true    Ran Out of Food in the Last Year: Never true  Transportation Needs: No Transportation Needs (09/08/2023)   PRAPARE - Administrator, Civil Service (Medical): No    Lack of Transportation (Non-Medical): No  Physical Activity: Not on file  Stress: Not on file  Social Connections: Unknown (09/06/2023)   Social Connection and Isolation Panel [NHANES]    Frequency of Communication with Friends and Family: More than three times a week    Frequency of Social Gatherings with Friends and Family: More than three times a week    Attends Religious Services: More than 4 times per year    Active Member of [employer] or Organizations: No    Attends Banker Meetings: 1 to 4 times per year    Marital Status: Patient declined    Allergies:  Allergies  Allergen Reactions   Banana Swelling and Other (See Comments)    Facial swelling   Penicillins Itching, Swelling and Rash    Face swells and break out   Strawberry Extract Swelling and Other (See Comments)    Facial swelling    Objective:    Vital Signs:   Temp:  [98.1 F (36.7 C)-99.9 F (37.7 C)] 99.9 F (37.7 C) (04/22 0700) Pulse Rate:  [66-116] 86 (04/22 0800) Resp:  [18-39] 24 (04/22 0800) BP: (93-137)/(52-95) 124/64 (04/22 0800) SpO2:  [86 %-100 %] 93 % (04/22 0800) FiO2 (%):  [60 %-100 %] 60 % (04/22 0000) Weight:   [107.7 kg-112.4 kg] 107.7 kg (04/22 0500) Last BM Date :  (PTA-Per patient about a week ago)  Weight change: Filed Weights   09/07/23 0645 09/07/23 1920 09/08/23 0500  Weight: 113.5 kg 112.4 kg 107.7 kg    Intake/Output:   Intake/Output Summary (Last 24 hours) at 09/08/2023 0827 Last data filed at 09/08/2023 0700 Gross per 24 hour  Intake 411.53 ml  Output 1165 ml  Net -753.47 ml      Physical Exam    General: Chronically ill appearing Neck: JVP difficult d/t thick neck Cor: Regular rate & rhythm. No rubs, gallops or murmurs. Lungs: clear Abdomen: obese, soft, nontender, nondistended.  Extremities: 1+ edema Neuro: alert & orientedx3. Affect flat.   Telemetry   Sinus with very frequent PVCs  Labs   Basic Metabolic Panel: Recent Labs  Lab 09/06/23 1442 09/07/23 0212 09/07/23 1815 09/07/23 1822 09/07/23 1823 09/07/23 1844 09/08/23 0325  NA 139 140 142 141 140 140 139  K 4.7 4.7 4.6 4.7 4.5 4.6 4.3  CL 110 106  --   --   --   --  104  CO2 19* 22  --   --   --   --  24  GLUCOSE 278* 221*  --   --   --   --  168*  BUN 18 24*  --   --   --   --  30*  CREATININE 1.23* 1.50*  --   --   --   --  1.76*  CALCIUM  6.8* 8.0*  --   --   --   --  7.6*  MG  --  1.9  --   --   --   --   --     Liver Function Tests: Recent Labs  Lab 09/06/23 1442 09/07/23 0212  AST 32 112*  ALT 18 37  ALKPHOS 106 129*  BILITOT 1.3* 0.7  PROT 5.7* 6.6  ALBUMIN  2.0* 2.6*   No results for input(s): "LIPASE", "AMYLASE" in the last 168 hours. No results for input(s): "AMMONIA" in the last 168 hours.  CBC: Recent Labs  Lab 09/06/23 1442 09/07/23 0212 09/07/23 1815 09/07/23 1822 09/07/23 1823 09/07/23 1844 09/08/23 0325  WBC 17.8* 15.2*  --   --   --   --  12.2*  NEUTROABS 15.7*  --   --   --   --   --   --   HGB 11.6* 11.2* 11.9* 12.2 11.9* 10.9* 10.0*  HCT 39.9 37.8 35.0* 36.0 35.0* 32.0* 33.0*  MCV 87.9 85.3  --   --   --   --  83.8  PLT 271 224  --   --   --   --  245     Cardiac Enzymes: No results for input(s): "CKTOTAL", "CKMB", "CKMBINDEX", "TROPONINI" in the last 168 hours.  BNP: BNP (last 3 results) Recent Labs    09/06/23 1404 09/07/23 0856  BNP 359.9* 1,077.5*    ProBNP (last 3 results) No results for input(s): "PROBNP" in the last 8760 hours.   CBG: Recent Labs  Lab 09/07/23 1936 09/07/23 2224 09/07/23 2335 09/08/23 0512 09/08/23 0729  GLUCAP 205* 204* 194* 175* 152*    Coagulation Studies: No results for input(s): "LABPROT", "INR" in the last 72 hours.   Imaging   US  EKG SITE RITE Result Date: 09/08/2023 If [facility] image not attached, placement could not be confirmed due to current cardiac rhythm.  CARDIAC CATHETERIZATION Addendum Date: 09/07/2023   Ost RCA to Prox RCA lesion is 90% stenosed. Prox RCA lesion is 50% stenosed. Mid RCA to Dist RCA lesion is 100% stenosed.  (RPDA and RPL 1 fills via left-to-right collaterals)   Ost Cx to Mid Cx lesion is 60% stenosed with 70% stenosed side branch in 1st Mrg.   3rd Mrg lesion is 50% stenosed. Lat 3rd Mrg lesion is 80% stenosed.   1st LPL lesion is 100% stenosed.   Ramus-1 lesion is 20% stenosed.   Ramus-2 lesion is 90% stenosed.   Ost LAD to Prox LAD lesion is 30% stenosed.   Prox LAD to Mid LAD lesion is 80% stenosed.   Mid LAD lesion is 40% stenosed.   Hemodynamic findings consistent with severe pulmonary hypertension.   Recent IV heparin  2 hours post TR band removal Patient likely NOT a good CVTS consultation candidate and therefore would plan to treat with DAPT Dominance: Right Severe multivessel CAD: Unclear culprit lesion 90% ostial RCA followed by 50% segmental stenosis and then 100% CTO after small marginal branch (PDA and PL fill via left-to-right collaterals) LM trifurcates into LAD, RI and small caliber LCx: Proximal LAD 20% followed by 80% focal heavily calcified lesion in a small diagonal branch, there is another 40% mid LAD stenosis but the rest the LAD is relatively  free of disease just calcified RI is the same caliber as the LAD with 20% proximal stenosis  followed by segment 90% stenosis and then the rest of the vessel is free of disease Small caliber LCx which gives off tiny OM1 and terminates as a bifurcating OM 2 that gives off a sidebranch, the AV groove circumflex has LPL 1 occluded and then 2 small LPL branches. Severe Pulmonary Hypertension-with a significant portion Of WHO Class II component (Acute Combined Systolic and Diastolic Heart Failure): Mean PAP 44 mmHg with a PCWP of 34 mmHg.  (Unable to cross the aortic valve due to difficulty advancing the catheter due to radial spasm.) PAP-mean 54/42-44 mmHg; PCWP 34 mmHg AO sat 100%, PA sat 53%.  Cardiac Output/Index by Albertina Hugger 4.09-1.86 RECOMMENDATIONS Plan is to transfer the patient to CVICU for ongoing care with advanced heart failure team consulting for helping out with treatment of heart failure.   Recommend uninterrupted dual antiplatelet therapy with Aspirin  81mg  daily and Clopidogrel  75mg  daily for a minimum of 12 months (ACS-Class I recommendation).   Recent IV heparin  2 hours post TR band removal - Patient is likely NOT a good CVTS consultation candidate for CABG given co-morbidities & Bilateral amputee.  Would treat with aspirin  / Plavix  DAPT. Load Plavix  300 mg Plavix  today. Will need to discuss with the remainder of the ICU team as to potential options for PCI would most likely targets being RI and LAD lesions that would likely require to minimum shockwave lithotripsy.  Major issue would be access for catheter advancement as the radial access will probably not tolerate 6 [place] guide catheter. Restart IV heparin  2 hours after TR band removal Randene Bustard, MD   Result Date: 09/07/2023 Images from the original result were not included.   Ost RCA to Prox RCA lesion is 90% stenosed. Prox RCA lesion is 50% stenosed. Mid RCA to Dist RCA lesion is 100% stenosed.  (RPDA and RPL 1 fills via left-to-right collaterals)    Ost Cx to Mid Cx lesion is 60% stenosed with 70% stenosed side branch in 1st Mrg.   3rd Mrg lesion is 50% stenosed. Lat 3rd Mrg lesion is 80% stenosed.   1st LPL lesion is 100% stenosed.   Ramus-1 lesion is 20% stenosed.   Ramus-2 lesion is 90% stenosed.   Ost LAD to Prox LAD lesion is 30% stenosed.   Prox LAD to Mid LAD lesion is 80% stenosed.   Mid LAD lesion is 40% stenosed.   Hemodynamic findings consistent with severe pulmonary hypertension. Dominance: Right Severe multivessel CAD: Unclear culprit lesion 90% ostial RCA followed by 50% segmental stenosis and then 100% CTO after small marginal branch (PDA and PL fill via left-to-right collaterals) LM trifurcates into LAD, RI and small caliber LCx: Proximal LAD 20% followed by 80% focal heavily calcified lesion in a small diagonal branch, there is another 40% mid LAD stenosis but the rest the LAD is relatively free of disease just calcified RI is the same caliber as the LAD with 20% proximal stenosis followed by segment 90% stenosis and then the rest of the vessel is free of disease Small caliber LCx which gives off tiny OM1 and terminates as a bifurcating OM 2 that gives off a sidebranch, the AV groove circumflex has LPL 1 occluded and then 2 small LPL branches. Severe Pulmonary Hypertension-with a significant portion Of WHO Class II component (Acute Combined Systolic and Diastolic Heart Failure): Mean PAP 44 mmHg with a PCWP of 34 mmHg.  (Unable to cross the aortic valve due to difficulty advancing the catheter due to radial spasm.) PAP-mean 54/42-44 mmHg;  PCWP 34 mmHg AO sat 100%, PA sat 53%.  Cardiac Output/Index by Albertina Hugger 4.09-1.86 RECOMMENDATIONS Plan is to transfer the patient to CVICU for ongoing care with advanced heart failure team consulting for helping out with treatment of heart failure.   Recommend uninterrupted dual antiplatelet therapy with Aspirin  81mg  daily and Clopidogrel  75mg  daily for a minimum of 12 months (ACS-Class I recommendation).    Recent IV heparin  2 hours post TR band removal Patient likely a good CVTS consultation candidate and therefore would strongly consider aspirin  Plavix  Will need to discuss with the remainder of the ICU team as to potential options for PCI would most likely targets being RI and LAD lesions that would likely require to minimum shockwave lithotripsy.  Major issue would be access for catheter advancement as the radial access will probably not tolerate 6 [place] guide catheter. Restart IV heparin  2 hours after TR band removal Randene Bustard, MD  ECHOCARDIOGRAM COMPLETE Result Date: 09/07/2023    ECHOCARDIOGRAM REPORT   Patient Name:   [name] Date of Exam: 09/07/2023 Medical Rec #:  [id number]        Height:       66.0 in Accession #:    [id number]       Weight:       250.2 lb Date of Birth:  [birth year]         BSA:          2.200 m Patient Age:    67 years         BP:           133/79 mmHg Patient Gender: F                HR:           90 bpm. Exam Location:  Inpatient Procedure: 2D Echo, Color Doppler, Cardiac Doppler and Intracardiac            Opacification Agent (Both Spectral and Color Flow Doppler were            utilized during procedure). Indications:    Elevated Troponins  History:        Patient has prior history of Echocardiogram examinations, most                 recent 01/10/2013. CHF, CAD; Risk Factors:Hypertension, Diabetes                 and Dyslipidemia.  Sonographer:    Sherline Distel Senior RDCS Referring Phys: [id number] Roxana Copier  Sonographer Comments: Technically difficult due to patient body habitus IMPRESSIONS  1. LVEF is difficult to quantify due to beat to beat variability (trigeminy throughout study). Left ventricular ejection fraction, by estimation, is 20 to 25%. The left ventricle has severely decreased function. The left ventricle demonstrates global hypokinesis. There is mild concentric left ventricular hypertrophy. Left ventricular diastolic parameters are consistent with Grade II  diastolic dysfunction (pseudonormalization).  2. Right ventricular systolic function is mildly reduced. The right ventricular size is normal. There is moderately elevated pulmonary artery systolic pressure. The estimated right ventricular systolic pressure is 49.2 mmHg.  3. Left atrial size was mildly dilated.  4. The mitral valve is normal in structure. Mild to moderate mitral valve regurgitation.  5. Tricuspid valve regurgitation is moderate.  6. The aortic valve is tricuspid. There is mild calcification of the aortic valve. Aortic valve regurgitation is not visualized.  7. The inferior vena cava is dilated in size with >50% respiratory variability,  suggesting right atrial pressure of 8 mmHg.  8. Cannot exclude a small PFO. FINDINGS  Left Ventricle: LVEF is difficult to quantify due to beat to beat variability (trigeminy throughout study). Left ventricular ejection fraction, by estimation, is 20 to 25%. The left ventricle has severely decreased function. The left ventricle demonstrates global hypokinesis. Definity  contrast agent was given IV to delineate the left ventricular endocardial borders. The left ventricular internal cavity size was normal in size. There is mild concentric left ventricular hypertrophy. Left ventricular diastolic parameters are consistent with Grade II diastolic dysfunction (pseudonormalization). Right Ventricle: The right ventricular size is normal. No increase in right ventricular wall thickness. Right ventricular systolic function is mildly reduced. There is moderately elevated pulmonary artery systolic pressure. The tricuspid regurgitant velocity is 3.21 m/s, and with an assumed right atrial pressure of 8 mmHg, the estimated right ventricular systolic pressure is 49.2 mmHg. Left Atrium: Left atrial size was mildly dilated. Right Atrium: Right atrial size was normal in size. Pericardium: Trivial pericardial effusion is present. Mitral Valve: The mitral valve is normal in structure. Mild  to moderate mitral valve regurgitation. Tricuspid Valve: The tricuspid valve is normal in structure. Tricuspid valve regurgitation is moderate. Aortic Valve: The aortic valve is tricuspid. There is mild calcification of the aortic valve. Aortic valve regurgitation is not visualized. Pulmonic Valve: The pulmonic valve was normal in structure. Pulmonic valve regurgitation is trivial. Aorta: The aortic root and ascending aorta are structurally normal, with no evidence of dilitation. Venous: The inferior vena cava is dilated in size with greater than 50% respiratory variability, suggesting right atrial pressure of 8 mmHg. IAS/Shunts: Cannot exclude a small PFO.  LEFT VENTRICLE PLAX 2D LVIDd:         4.80 cm LVIDs:         4.50 cm LV PW:         1.00 cm LV IVS:        0.90 cm LVOT diam:     2.00 cm LV SV:         39 LV SV Index:   18 LVOT Area:     3.14 cm  RIGHT VENTRICLE RV S prime:     7.90 cm/s TAPSE (M-mode): 1.6 cm LEFT ATRIUM             Index        RIGHT ATRIUM           Index LA diam:        4.30 cm 1.95 cm/m   RA Area:     11.90 cm LA Vol (A2C):   54.0 ml 24.55 ml/m  RA Volume:   26.20 ml  11.91 ml/m LA Vol (A4C):   68.8 ml 31.28 ml/m LA Biplane Vol: 67.7 ml 30.78 ml/m  AORTIC VALVE LVOT Vmax:   75.05 cm/s LVOT Vmean:  50.050 cm/s LVOT VTI:    0.124 m  AORTA Ao Root diam: 2.90 cm Ao Asc diam:  3.20 cm TRICUSPID VALVE TR Peak grad:   41.2 mmHg TR Vmax:        321.00 cm/s  SHUNTS Systemic VTI:  0.12 m Systemic Diam: 2.00 cm Jules Oar MD Electronically signed by Jules Oar MD Signature Date/Time: 09/07/2023/11:22:18 AM    Final      Medications:     Current Medications:  aspirin   81 mg Oral Daily   atorvastatin   80 mg Oral Daily   Chlorhexidine  Gluconate Cloth  6 each Topical Daily   clopidogrel   75  mg Oral Daily   ferrous sulfate   325 mg Oral BID WC   insulin  aspart  0-15 Units Subcutaneous Q4H   insulin  glargine-yfgn  15 Units Subcutaneous BID   ipratropium-albuterol   3 mL  Nebulization Once   sodium chloride  flush  3 mL Intravenous Q12H    Infusions:  sodium chloride      heparin  1,400 Units/hr (09/08/23 0700)      Patient Profile   68 y.o. female with prior history of nonobstructive CAD, HF with recovered EF, insulin  dependent DM II, bilateral BKA, hx CVA.   Admitted with NSTEMI and acute respiratory failure 2/2 CHF.  Assessment/Plan   NSTEMI: -HS troponin > 24k -LHC with multivessel CAD. Not a CABG candidate. Reviewed images with interventional. No good targets for PCI either. Will manage medically. -DAPT with aspirin  and plavix . Heparin  gtt. -High-intensity statin -Chest pain free  2. HF with recovered EF >> acute systolic CHF -Echo with EF 20-25%, grade II DD, RV mildly reduced, RVSP 49 mmHg, moderate TR, dilated IVC -RHC PA mean 44, PCWP 44, CI 1.86. Difficult case, unable to get obtain additional values -Volume very difficult on exam -Place PICC. Follow CVP. -Start IV lasix  160 BID and give 5 mg metolazone  -Start spiro 25 mg daily -Hold off on beta blocker for now with low output - Poor SGLT2i candidate  3. PVCs - Watch burden, may improve with optimization of HF otherwise can add amiodarone - Keep K > 4 and Mag > 2  4. Insulin  dependent DM II -A1c 7.3% -Per primary  5. AKI - Scr 1.2>1.76 - Suspect cardiorenal + contrast exposure - Follow with diuresis    Length of Stay: 2  Harolyn Cocker N, PA-C  09/08/2023, 8:27 AM    Advanced Heart Failure Team Pager [phone] (M-F; 7a - 5p)  Please contact [facility] for night-coverage after hours (4p -7a ) and weekends on [email or web address]

## 2023-09-08 NOTE — Evaluation (Signed)
 Occupational Therapy Evaluation Patient Details Name: [name] MRN: [id number] DOB: 03/21/1956 Today's Date: 09/08/2023   History of Present Illness   68 y.o. female who is admitted to [facility] on 09/06/2023 with NSTEMI after presenting from SNF to [facility] ED for evaluation of hypoxia, elevated tropon, NSTEMI, heart failure, 4/21 L and R heart cath via R radial. Required BiPAP overnight.   PMH significant for chronic diastolic heart failure, type 2 diabetes mellitus complicated by diabetic peripheral polyneuropathy, essential hypertension, hyperlipidemia, chronic iron  deficiency anemia, bil BKA     Clinical Impressions Pt is a LTC resident of [facility]. She is largely dependent in ADL and mobility. She does self-feed and participates in some grooming tasks (oral care and face washing) all other self-care tasks (bathing, dressing, toileting, hair maintenance) are done for her by staff. She is dependent on a mechanical lift for transfers to a WC that she does not propel. She reports that it has been a long time since she donned her prosthetics, and that she does not get out of bed every day. She is conversational, but not cognitively intact. Pt unable to recall RUE restrictions several minutes after education. As Pt is a LTC resident and is at her baseline for ADL and transfer assist, she should return to LTC SNF post-acute. OT will sign off at this time.      If plan is discharge home, recommend the following:   A lot of help with bathing/dressing/bathroom;Direct supervision/assist for medications management     Functional Status Assessment   Patient has not had a recent decline in their functional status     Equipment Recommendations   None recommended by OT (Pt has appropriate DME at LTC)     Recommendations for Other Services         Precautions/Restrictions   Precautions Precautions: Fall;Other (comment) Recall of Precautions/Restrictions:  Impaired Precaution/Restrictions Comments: Rt radial access <24 hrs Restrictions Other Position/Activity Restrictions: Rt radial access <24 hrs     Mobility Bed Mobility Overal bed mobility: Needs Assistance Bed Mobility: Rolling Rolling: Total assist, +2 for physical assistance, Used rails         General bed mobility comments: roll to rt only (due to unable to use RUE much post-cath), pt partially reaches, turns head with cues but does <25% of the effort to roll; unable to maintain R sidelying while holding rail; +2 total to scoot up toward [facility]    Transfers                   General transfer comment: deferred as pt is using a lift for OOB at LTC      Balance                                           ADL either performed or assessed with clinical judgement   ADL Overall ADL's : At baseline                                       General ADL Comments: Pt able to bring hand to face for grooming tasks, and self-feed     Vision Ability to See in Adequate Light: 0 Adequate Patient Visual Report: No change from baseline Vision Assessment?: No apparent visual deficits (wears glasses, but  not present during evaluation)     Perception         Praxis         Pertinent Vitals/Pain Pain Assessment Pain Assessment: No/denies pain     Extremity/Trunk Assessment Upper Extremity Assessment Upper Extremity Assessment: Generalized weakness;RUE deficits/detail RUE Deficits / Details: limited assessment due to radial access (dominant hand)   Lower Extremity Assessment Lower Extremity Assessment: Defer to PT evaluation (Bil BKA)   Cervical / Trunk Assessment Cervical / Trunk Assessment: Other exceptions Cervical / Trunk Exceptions: overweight   Communication Communication Communication: No apparent difficulties   Cognition Arousal: Alert Behavior During Therapy: WFL for tasks assessed/performed Cognition: History of cognitive  impairments, No family/caregiver present to determine baseline             OT - Cognition Comments: Pt unreliable historian. pleasant and oriented to month and year - unable to state president or why she was at the hospital.                 Following commands: Intact       Cueing  General Comments   Cueing Techniques: Verbal cues;Gestural cues  VSS on ICU monitor, pt on [place] O2   Exercises     Shoulder Instructions      Home Living Family/patient expects to be discharged to:: Skilled nursing facility (been at [facility] for 5 years)                                 Additional Comments: does not get out of bed every day, when she does she uses lift, she does not push herself in [facility], gets assist with bed mobility, she DOES self-feed, and grooms, uses diapers for toilet, bed baths, does have prosthetics and she WAS using them to stand and take steps (10 ft) without DME (pt states it's been a long time since she did this). Pt likes to play cards (spades) Pt unreliable historian.      Prior Functioning/Environment Prior Level of Function : Needs assist;Patient poor historian/Family not available             Mobility Comments: recently lift OOB almost daily to wheelchair, supposedly was transferring with Bil prosthetics but reports that it has been months ADLs Comments: dependent on ADL/IADL - the only thing that she does is self-feed and groom. dependent for bathing/dressing, toileting, transfers    OT Problem List: Decreased strength;Impaired UE functional use   OT Treatment/Interventions:        OT Goals(Current goals can be found in the care plan section)   Acute Rehab OT Goals Patient Stated Goal: return to [facility] OT Goal Formulation: With patient Time For Goal Achievement: 09/22/23 Potential to Achieve Goals: Good   OT Frequency:       Co-evaluation PT/OT/SLP Co-Evaluation/Treatment: Yes Reason for Co-Treatment: For patient/therapist  safety;To address functional/ADL transfers PT goals addressed during session: Mobility/safety with mobility;Strengthening/ROM        AM-PAC OT "6 Clicks" Daily Activity     Outcome Measure Help from another person eating meals?: A Little Help from another person taking care of personal grooming?: A Little Help from another person toileting, which includes using toliet, bedpan, or urinal?: Total Help from another person bathing (including washing, rinsing, drying)?: Total Help from another person to put on and taking off regular upper body clothing?: Total Help from another person to put on and taking off regular lower body  clothing?: Total 6 Click Score: 10   End of Session Equipment Utilized During Treatment: Oxygen (2L) Nurse Communication: Mobility status;Need for lift equipment;Precautions  Activity Tolerance: Patient tolerated treatment well Patient left: in bed;with call bell/phone within reach;with bed alarm set  OT Visit Diagnosis: Muscle weakness (generalized) (M62.81)                Time: 1610-9604 OT Time Calculation (min): 23 min Charges:  OT General Charges $OT Visit: 1 Visit OT Evaluation $OT Eval Low Complexity: 1 Low  [place] OTR/L Acute Rehabilitation Services Office: [phone]  Ebony Goldstein [facility] 09/08/2023, 11:38 AM

## 2023-09-08 NOTE — Progress Notes (Signed)
 Heart Failure Navigator Progress Note  Assessed for Heart & Vascular TOC clinic readiness.  Patient does not meet criteria due to Advanced Heart Failure Team patient of Dr. Elwyn Lade. .   Navigator will sign off at this time.    Rhae Hammock, BSN, Scientist, clinical (histocompatibility and immunogenetics) Only

## 2023-09-09 DIAGNOSIS — I214 Non-ST elevation (NSTEMI) myocardial infarction: Secondary | ICD-10-CM | POA: Diagnosis not present

## 2023-09-09 DIAGNOSIS — I5021 Acute systolic (congestive) heart failure: Secondary | ICD-10-CM | POA: Diagnosis not present

## 2023-09-09 LAB — FOLATE: Folate: 12.8 ng/mL (ref >5.9)

## 2023-09-09 LAB — GLUCOSE, CAPILLARY
Glucose-Capillary: 105 mg/dL — ABNORMAL HIGH (ref 70–99)
Glucose-Capillary: 170 mg/dL — ABNORMAL HIGH (ref 70–99)
Glucose-Capillary: 164 mg/dL — ABNORMAL HIGH (ref 70–99)
Glucose-Capillary: 195 mg/dL — ABNORMAL HIGH (ref 70–99)
Glucose-Capillary: 187 mg/dL — ABNORMAL HIGH (ref 70–99)
Glucose-Capillary: 156 mg/dL — ABNORMAL HIGH (ref 70–99)

## 2023-09-09 LAB — CBC
HCT: 31.8 % — ABNORMAL LOW (ref 36.0–46.0)
Hemoglobin: 9.8 g/dL — ABNORMAL LOW (ref 12.0–15.0)
MCH: 25.5 pg — ABNORMAL LOW (ref 26.0–34.0)
MCHC: 30.8 g/dL (ref 30.0–36.0)
MCV: 82.8 fL (ref 80.0–100.0)
Platelets: 226 10*3/uL (ref 150–400)
RBC: 3.84 MIL/uL — ABNORMAL LOW (ref 3.87–5.11)
RDW: 19.1 % — ABNORMAL HIGH (ref 11.5–15.5)
WBC: 13.3 10*3/uL — ABNORMAL HIGH (ref 4.0–10.5)
nRBC: 0 % (ref 0.0–0.2)

## 2023-09-09 LAB — BASIC METABOLIC PANEL WITH GFR
Anion gap: 14 (ref 5–15)
BUN: 35 mg/dL — ABNORMAL HIGH (ref 8–23)
CO2: 26 mmol/L (ref 22–32)
Calcium: 7.6 mg/dL — ABNORMAL LOW (ref 8.9–10.3)
Chloride: 97 mmol/L — ABNORMAL LOW (ref 98–111)
Creatinine, Ser: 2.25 mg/dL — ABNORMAL HIGH (ref 0.44–1.00)
GFR, Estimated: 23 mL/min — ABNORMAL LOW (ref >60)
Glucose, Bld: 106 mg/dL — ABNORMAL HIGH (ref 70–99)
Potassium: 3.8 mmol/L (ref 3.5–5.1)
Sodium: 137 mmol/L (ref 135–145)

## 2023-09-09 LAB — RETICULOCYTES
Immature Retic Fract: 21.1 % — ABNORMAL HIGH (ref 2.3–15.9)
RBC.: 3.94 MIL/uL (ref 3.87–5.11)
Retic Count, Absolute: 52.4 10*3/uL (ref 19.0–186.0)
Retic Ct Pct: 1.3 % (ref 0.4–3.1)

## 2023-09-09 LAB — IRON AND TIBC
Iron: 24 ug/dL — ABNORMAL LOW (ref 28–170)
Saturation Ratios: 12 % (ref 10.4–31.8)
TIBC: 209 ug/dL — ABNORMAL LOW (ref 250–450)
UIBC: 185 ug/dL

## 2023-09-09 LAB — FERRITIN: Ferritin: 275 ng/mL (ref 11–307)

## 2023-09-09 LAB — HEPARIN LEVEL (UNFRACTIONATED): Heparin Unfractionated: 0.55 [IU]/mL (ref 0.30–0.70)

## 2023-09-09 LAB — LIPOPROTEIN A (LPA): Lipoprotein (a): 78.7 nmol/L — ABNORMAL HIGH (ref <75.0)

## 2023-09-09 LAB — MAGNESIUM: Magnesium: 2.2 mg/dL (ref 1.7–2.4)

## 2023-09-09 LAB — VITAMIN B12: Vitamin B-12: 578 pg/mL (ref 180–914)

## 2023-09-09 MED ORDER — METOPROLOL SUCCINATE ER 25 MG PO TB24
25.0000 mg | ORAL_TABLET | Freq: Every day | ORAL | Status: DC
  Administered 2023-09-09 – 2023-09-16 (×8): 25 mg via ORAL
  Filled 2023-09-09 (×8): qty 1

## 2023-09-09 MED ORDER — IPRATROPIUM-ALBUTEROL 0.5-2.5 (3) MG/3ML IN SOLN
3.0000 mL | RESPIRATORY_TRACT | Status: DC | PRN
  Administered 2023-09-09 – 2023-09-15 (×8): 3 mL via RESPIRATORY_TRACT
  Filled 2023-09-09 (×8): qty 3

## 2023-09-09 NOTE — Progress Notes (Signed)
 PHARMACY - ANTICOAGULATION CONSULT NOTE  Pharmacy Consult for heparin  Indication: chest pain/ACS  Allergies  Allergen Reactions   Banana Swelling and Other (See Comments)    Facial swelling   Penicillins Itching, Swelling and Rash    Face swells and break out   Strawberry Extract Swelling and Other (See Comments)    Facial swelling    Patient Measurements: Height: 5\' 6"  (167.6 cm) Weight: 107.7 kg (237 lb 7 oz) IBW/kg (Calculated) : 59.3 HEPARIN  DW (KG): 85.9  Vital Signs: Temp: 99 F (37.2 C) (04/23 0400) Temp Source: Oral (04/23 0400) BP: 135/72 (04/23 0500) Pulse Rate: 79 (04/23 0500)  Labs: Recent Labs    09/06/23 1442 09/06/23 1442 09/06/23 1800 09/07/23 0212 09/07/23 0856 09/07/23 1206 09/07/23 1815 09/07/23 1844 09/08/23 0325 09/08/23 0805 09/08/23 1740 09/09/23 0438  HGB 11.6*  --   --  11.2*  --   --    < > 10.9* 10.0*  --   --  9.8*  HCT 39.9  --   --  37.8  --   --    < > 32.0* 33.0*  --   --  31.8*  PLT 271  --   --  224  --   --   --   --  245  --   --  226  HEPARINUNFRC  --    < >  --  0.27*  --  0.27*  --   --   --  0.26*  --  0.55  CREATININE 1.23*  --   --  1.50*  --   --   --   --  1.76*  --  2.14* 2.25*  TROPONINIHS 168*  --  5,743*  --  >24,000*  --   --   --   --   --   --   --    < > = values in this interval not displayed.    Estimated Creatinine Clearance: 29.7 mL/min (A) (by C-G formula based on SCr of 2.25 mg/dL (H)).   Medical History: Past Medical History:  Diagnosis Date   Atypical chest pain    a. non obstructive by cath in 2008 and 2010;  b. 02/2012 Myoview : non-ischemic, EF 57%   Cellulitis    a. left foot-> s/p L BKA   Chronic kidney disease    Constipation    CVA (cerebral infarction)    a.  Small right parietal noted incidentally 04/2007;  b. right sided embolic CVA 05/2012;  c. TEE 2/14:  LVH, EF 55-60%, mild LAE, no LAA clot, no PFO, no R->L shunt by echo contrast, oscillating density on AV likely Lambl's Excressence     Diabetes mellitus, type II (HCC)    Diabetic retinopathy (HCC)    NPDR w/edema OU   Diaphragmatic hernia without mention of obstruction or gangrene    Dry skin    Esophageal reflux    HTN (hypertension)    Hx of BKA (HCC)    Bilateral   Hyperlipidemia    Irritable bowel syndrome    Morbid obesity (HCC)    Obesity, unspecified    Pain in shoulder 06/28/2015   Peripheral neuropathy    Stroke [facility])    2013-'no residual'   Syncope and collapse    a. near-syncopal episode in November 2008;  b. 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   Tobacco abuse    Vertigo     Assessment: 17 YOF presenting with concern for ACS,  not on anticoagulation PTA.  Underwent cath 4/21 finding severe multivessel CAD and pulmonary hypertension. Plan to restart heparin  2 hours after TR band removed (documented on 4/21@2100 ) for medical management.    Heparin  level is therapeutic at 0.55 on 1500 units/hour.   Goal of Therapy:  Heparin  level 0.3-0.7 units/ml Monitor platelets by anticoagulation protocol: Yes   Plan:  Increase heparin  infusion at 1500 units/hr  End time placed for 48hr post procedure -Monitor daily HL, CBC, and for s/sx of bleeding    Albino Alu, PharmD PGY2 Cardiology Pharmacy Resident 09/09/2023 6:34 AM   Please check AMION for all [facility] phone numbers After 10:00 PM, call Main Pharmacy [phone]

## 2023-09-09 NOTE — TOC Progression Note (Signed)
 Transition of Care [facility]) - Progression Note    Patient Details  Name: [name] MRN: [id number] Date of Birth: Sep 27, 1955  [facility]) CM/SW Contact  Ernst Heap Phone Number: [phone] 09/09/2023, 2:48 PM  Clinical Narrative: Will need PT/OT evaluation closer to medical readiness, updated FL2, and insurance auth.   Will continue to follow for dc needs.       Expected Discharge Plan: Skilled Nursing Facility Barriers to Discharge: Continued Medical Work up, English as a second language teacher  Expected Discharge Plan and Services In-house Referral: Clinical Social Work   Post Acute Care Choice: Skilled Nursing Facility Living arrangements for the past 2 months: Skilled Nursing Facility                                       Social Determinants of Health (SDOH) Interventions SDOH Screenings   Food Insecurity: No Food Insecurity (09/08/2023)  Housing: Unknown (09/08/2023)  Transportation Needs: No Transportation Needs (09/08/2023)  Utilities: Not At Risk (09/06/2023)  Social Connections: Unknown (09/06/2023)  Tobacco Use: Medium Risk (09/07/2023)    Readmission Risk Interventions     No data to display

## 2023-09-09 NOTE — Progress Notes (Signed)
 PROGRESS NOTE    [name]  ZOX:[id number] DOB: 1956-01-11 DOA: 09/06/2023 PCP: Marquetta Sit, MD    Brief Narrative:  Patient is 68 year old female with history of chronic diastolic heart failure, type 2 diabetes on insulin , diabetic polyneuropathy, bilateral BKA and bedbound long-term nursing home resident, history of hypertension, hyperlipidemia, chronic iron -deficiency anemia presented to the hospital with shortness of breath from nursing home and found to have non-STEMI.  Routine vital signs at the nursing home revealed low oxygen saturations at 80% on room air.  This prompted her to send to ER.  Patient without particular symptoms but with vague symptoms. In the emergency room on 2 L oxygen.  Troponins [id number].  Started on heparin  infusion, admitted to hospital with cardiology consultation. 4/21, cardiac catheterization with extensive coronary artery disease not amenable to PCI or not a candidate for CABG. Also diuresed and followed by heart failure team.  Subjective: Patient seen and examined.  Discussed with heart failure team.  Patient currently denies any chest pain or shortness of breath.  She does not use oxygen or CPAP at home.  Does not walk. Assessment & Plan:   Non-STEMI: Presented with shortness of breath. Cardiac catheterization with severe multivessel CAD. Remains on DAPT and high intensity statin.  Completing 48 hours of heparin  infusion today.  Patient is not a candidate for PCI.  She is not a good candidate for CABG.  Medical management advised.  Acute on chronic systolic and diastolic congestive heart failure: She has history of diastolic heart failure.  Repeat echocardiogram consistent with ejection fraction 20 to 25% and grade 2 diastolic dysfunction.  Patient was treated with IV diuresis and metolazone .  Started on spironolactone .  Creatinine up today.  Diuretics holiday. Followed by heart failure team.  Type 2 diabetes: Well-controlled.  A1c 7.3.   On Lantus  that she takes at nursing home.  AKI on CKD stage IIIa: Recent baseline creatinine of 1.23.  Creatinine gradually going up 1.76-2.25.  Diuretics on hold today.  Recheck tomorrow morning.  Essential hypertension, stable Hyperlipidemia, stable Chronic iron  deficiency anemia, stable Bilateral amputee, long-term nursing home resident  Pressure Injury 09/07/23 Sacrum Right;Left;Mid Stage 1 -  Intact skin with non-blanchable redness of a localized area usually over a bony prominence. (Active)  09/07/23 0020  Location: Sacrum  Location Orientation: Right;Left;Mid  Staging: Stage 1 -  Intact skin with non-blanchable redness of a localized area usually over a bony prominence.  Wound Description (Comments):   Present on Admission: Yes        DVT prophylaxis: SCDs Start: 09/06/23 2052   Code Status: Full code Family Communication: None at the bedside Disposition Plan: Status is: Inpatient Remains inpatient appropriate because: IV diuresis, monitoring     Consultants:  Cardiology  Procedures:  Cardiac cath  Antimicrobials:  None     Objective: Vitals:   09/09/23 0500 09/09/23 0635 09/09/23 0700 09/09/23 0800  BP: 135/72 113/64 (!) 133/90 130/64  Pulse: 79 80 84 86  Resp: (!) 25 (!) 30 (!) 32 (!) 28  Temp:   98.8 F (37.1 C)   TempSrc:   Oral   SpO2: 97% 96% 96% 98%  Weight: 107.3 kg     Height:        Intake/Output Summary (Last 24 hours) at 09/09/2023 0952 Last data filed at 09/09/2023 0857 Gross per 24 hour  Intake 1144.33 ml  Output 3070 ml  Net -1925.67 ml   Filed Weights   09/07/23 1920 09/08/23 0500 09/09/23  0500  Weight: 112.4 kg 107.7 kg 107.3 kg    Examination:  General: Chronically sick looking.  Frail and debilitated.  Not in any distress. Cardiovascular: S1-S2 normal.  Regular rate rhythm. Respiratory: Bilateral clear.  No added sounds. SpO2: 98 % O2 Flow Rate (L/min): 2 L/min FiO2 (%): 60 %  Gastrointestinal: Soft.  Obese and  pendulous.  Nontender.  Bowel sound present. Ext: No swelling or edema.  Bilateral BKA stump clean and dry. Neuro: Intact.     Data Reviewed: I have personally reviewed following labs and imaging studies  CBC: Recent Labs  Lab 09/06/23 1442 09/07/23 0212 09/07/23 1815 09/07/23 1822 09/07/23 1823 09/07/23 1844 09/08/23 0325 09/09/23 0438  WBC 17.8* 15.2*  --   --   --   --  12.2* 13.3*  NEUTROABS 15.7*  --   --   --   --   --   --   --   HGB 11.6* 11.2*   < > 12.2 11.9* 10.9* 10.0* 9.8*  HCT 39.9 37.8   < > 36.0 35.0* 32.0* 33.0* 31.8*  MCV 87.9 85.3  --   --   --   --  83.8 82.8  PLT 271 224  --   --   --   --  245 226   < > = values in this interval not displayed.   Basic Metabolic Panel: Recent Labs  Lab 09/06/23 1442 09/07/23 0212 09/07/23 1815 09/07/23 1823 09/07/23 1844 09/08/23 0325 09/08/23 1740 09/09/23 0438  NA 139 140   < > 140 140 139 137 137  K 4.7 4.7   < > 4.5 4.6 4.3 4.0 3.8  CL 110 106  --   --   --  104 99 97*  CO2 19* 22  --   --   --  24 26 26   GLUCOSE 278* 221*  --   --   --  168* 145* 106*  BUN 18 24*  --   --   --  30* 31* 35*  CREATININE 1.23* 1.50*  --   --   --  1.76* 2.14* 2.25*  CALCIUM  6.8* 8.0*  --   --   --  7.6* 7.7* 7.6*  MG  --  1.9  --   --   --   --   --  2.2   < > = values in this interval not displayed.   GFR: Estimated Creatinine Clearance: 29.7 mL/min (A) (by C-G formula based on SCr of 2.25 mg/dL (H)). Liver Function Tests: Recent Labs  Lab 09/06/23 1442 09/07/23 0212  AST 32 112*  ALT 18 37  ALKPHOS 106 129*  BILITOT 1.3* 0.7  PROT 5.7* 6.6  ALBUMIN  2.0* 2.6*   No results for input(s): "LIPASE", "AMYLASE" in the last 168 hours. No results for input(s): "AMMONIA" in the last 168 hours. Coagulation Profile: No results for input(s): "INR", "PROTIME" in the last 168 hours. Cardiac Enzymes: No results for input(s): "CKTOTAL", "CKMB", "CKMBINDEX", "TROPONINI" in the last 168 hours. BNP (last 3 results) No  results for input(s): "PROBNP" in the last 8760 hours. HbA1C: Recent Labs    09/07/23 0212  HGBA1C 7.3*   CBG: Recent Labs  Lab 09/08/23 2003 09/08/23 2125 09/08/23 2337 09/09/23 0442 09/09/23 0753  GLUCAP 168* 157* 147* 105* 170*   Lipid Profile: Recent Labs    09/07/23 0856  CHOL 127  HDL 40*  LDLCALC 61  TRIG 409  CHOLHDL 3.2   Thyroid  Function Tests:  No results for input(s): "TSH", "T4TOTAL", "FREET4", "T3FREE", "THYROIDAB" in the last 72 hours. Anemia Panel: No results for input(s): "VITAMINB12", "FOLATE", "FERRITIN", "TIBC", "IRON ", "RETICCTPCT" in the last 72 hours. Sepsis Labs: Recent Labs  Lab 09/07/23 0211  PROCALCITON 0.10    Recent Results (from the past 240 hours)  Resp panel by RT-PCR (RSV, Flu A&B, Covid) Anterior Nasal Swab     Status: None   Collection Time: 09/06/23  2:05 PM   Specimen: Anterior Nasal Swab  Result Value Ref Range Status   SARS Coronavirus 2 by RT PCR NEGATIVE NEGATIVE Final   Influenza A by PCR NEGATIVE NEGATIVE Final   Influenza B by PCR NEGATIVE NEGATIVE Final    Comment: (NOTE) The Xpert Xpress SARS-CoV-2/FLU/RSV plus assay is intended as an aid in the diagnosis of influenza from Nasopharyngeal swab specimens and should not be used as a sole basis for treatment. Nasal washings and aspirates are unacceptable for Xpert Xpress SARS-CoV-2/FLU/RSV testing.  Fact Sheet for Patients: BloggerCourse.com  Fact Sheet for Healthcare Providers: SeriousBroker.it  This test is not yet approved or cleared by the [place]  FDA and has been authorized for detection and/or diagnosis of SARS-CoV-2 by FDA under an Emergency Use Authorization (EUA). This EUA will remain in effect (meaning this test can be used) for the duration of the COVID-19 declaration under Section 564(b)(1) of the Act, 21 U.S.C. section 360bbb-3(b)(1), unless the authorization is terminated or revoked.      Resp Syncytial Virus by PCR NEGATIVE NEGATIVE Final    Comment: (NOTE) Fact Sheet for Patients: BloggerCourse.com  Fact Sheet for Healthcare Providers: SeriousBroker.it  This test is not yet approved or cleared by the [place]  FDA and has been authorized for detection and/or diagnosis of SARS-CoV-2 by FDA under an Emergency Use Authorization (EUA). This EUA will remain in effect (meaning this test can be used) for the duration of the COVID-19 declaration under Section 564(b)(1) of the Act, 21 U.S.C. section 360bbb-3(b)(1), unless the authorization is terminated or revoked.  Performed at [facility], [address] [place], [place] [address]   MRSA Next Gen by PCR, Nasal     Status: None   Collection Time: 09/07/23  1:10 AM   Specimen: Nasal Mucosa; Nasal Swab  Result Value Ref Range Status   MRSA by PCR Next Gen NOT DETECTED NOT DETECTED Final    Comment: (NOTE) The GeneXpert MRSA Assay (FDA approved for NASAL specimens only), is one component of a comprehensive MRSA colonization surveillance program. It is not intended to diagnose MRSA infection nor to guide or monitor treatment for MRSA infections. Test performance is not FDA approved in patients less than 11 years old. Performed at [facility], [address], [place], [place] [address]   MRSA Next Gen by PCR, Nasal     Status: None   Collection Time: 09/07/23  7:32 PM   Specimen: Nasal Mucosa; Nasal Swab  Result Value Ref Range Status   MRSA by PCR Next Gen NOT DETECTED NOT DETECTED Final    Comment: (NOTE) The GeneXpert MRSA Assay (FDA approved for NASAL specimens only), is one component of a comprehensive MRSA colonization surveillance program. It is not intended to diagnose MRSA infection nor to guide or monitor treatment for MRSA infections. Test performance is not FDA approved in patients less than 73 years old. Performed at [facility]  [facility], [address] [place], [place] [address]          Radiology  Studies: US  EKG SITE RITE Result Date: 09/08/2023 If [facility] image not attached, placement could not be confirmed due to current cardiac rhythm.  CARDIAC CATHETERIZATION Addendum Date: 09/07/2023   Ost RCA to Prox RCA lesion is 90% stenosed. Prox RCA lesion is 50% stenosed. Mid RCA to Dist RCA lesion is 100% stenosed.  (RPDA and RPL 1 fills via left-to-right collaterals)   Ost Cx to Mid Cx lesion is 60% stenosed with 70% stenosed side branch in 1st Mrg.   3rd Mrg lesion is 50% stenosed. Lat 3rd Mrg lesion is 80% stenosed.   1st LPL lesion is 100% stenosed.   Ramus-1 lesion is 20% stenosed.   Ramus-2 lesion is 90% stenosed.   Ost LAD to Prox LAD lesion is 30% stenosed.   Prox LAD to Mid LAD lesion is 80% stenosed.   Mid LAD lesion is 40% stenosed.   Hemodynamic findings consistent with severe pulmonary hypertension.   Recent IV heparin  2 hours post TR band removal Patient likely NOT a good CVTS consultation candidate and therefore would plan to treat with DAPT Dominance: Right Severe multivessel CAD: Unclear culprit lesion 90% ostial RCA followed by 50% segmental stenosis and then 100% CTO after small marginal branch (PDA and PL fill via left-to-right collaterals) LM trifurcates into LAD, RI and small caliber LCx: Proximal LAD 20% followed by 80% focal heavily calcified lesion in a small diagonal branch, there is another 40% mid LAD stenosis but the rest the LAD is relatively free of disease just calcified RI is the same caliber as the LAD with 20% proximal stenosis followed by segment 90% stenosis and then the rest of the vessel is free of disease Small caliber LCx which gives off tiny OM1 and terminates as a bifurcating OM 2 that gives off a sidebranch, the AV groove circumflex has LPL 1 occluded and then 2 small LPL branches. Severe Pulmonary Hypertension-with a significant portion Of WHO Class II component (Acute Combined Systolic and  Diastolic Heart Failure): Mean PAP 44 mmHg with a PCWP of 34 mmHg.  (Unable to cross the aortic valve due to difficulty advancing the catheter due to radial spasm.) PAP-mean 54/42-44 mmHg; PCWP 34 mmHg AO sat 100%, PA sat 53%.  Cardiac Output/Index by Albertina Hugger 4.09-1.86 RECOMMENDATIONS Plan is to transfer the patient to CVICU for ongoing care with advanced heart failure team consulting for helping out with treatment of heart failure.   Recommend uninterrupted dual antiplatelet therapy with Aspirin  81mg  daily and Clopidogrel  75mg  daily for a minimum of 12 months (ACS-Class I recommendation).   Recent IV heparin  2 hours post TR band removal - Patient is likely NOT a good CVTS consultation candidate for CABG given co-morbidities & Bilateral amputee.  Would treat with aspirin  / Plavix  DAPT. Load Plavix  300 mg Plavix  today. Will need to discuss with the remainder of the ICU team as to potential options for PCI would most likely targets being RI and LAD lesions that would likely require to minimum shockwave lithotripsy.  Major issue would be access for catheter advancement as the radial access will probably not tolerate 6 [place] guide catheter. Restart IV heparin  2 hours after TR band removal Randene Bustard, MD   Result Date: 09/07/2023 Images from the original result were not included.   Ost RCA to Prox RCA lesion is 90% stenosed. Prox RCA lesion is 50% stenosed. Mid RCA to Dist RCA lesion is 100% stenosed.  (RPDA and RPL 1 fills via left-to-right collaterals)   Ost Cx to Mid  Cx lesion is 60% stenosed with 70% stenosed side branch in 1st Mrg.   3rd Mrg lesion is 50% stenosed. Lat 3rd Mrg lesion is 80% stenosed.   1st LPL lesion is 100% stenosed.   Ramus-1 lesion is 20% stenosed.   Ramus-2 lesion is 90% stenosed.   Ost LAD to Prox LAD lesion is 30% stenosed.   Prox LAD to Mid LAD lesion is 80% stenosed.   Mid LAD lesion is 40% stenosed.   Hemodynamic findings consistent with severe pulmonary hypertension. Dominance: Right  Severe multivessel CAD: Unclear culprit lesion 90% ostial RCA followed by 50% segmental stenosis and then 100% CTO after small marginal branch (PDA and PL fill via left-to-right collaterals) LM trifurcates into LAD, RI and small caliber LCx: Proximal LAD 20% followed by 80% focal heavily calcified lesion in a small diagonal branch, there is another 40% mid LAD stenosis but the rest the LAD is relatively free of disease just calcified RI is the same caliber as the LAD with 20% proximal stenosis followed by segment 90% stenosis and then the rest of the vessel is free of disease Small caliber LCx which gives off tiny OM1 and terminates as a bifurcating OM 2 that gives off a sidebranch, the AV groove circumflex has LPL 1 occluded and then 2 small LPL branches. Severe Pulmonary Hypertension-with a significant portion Of WHO Class II component (Acute Combined Systolic and Diastolic Heart Failure): Mean PAP 44 mmHg with a PCWP of 34 mmHg.  (Unable to cross the aortic valve due to difficulty advancing the catheter due to radial spasm.) PAP-mean 54/42-44 mmHg; PCWP 34 mmHg AO sat 100%, PA sat 53%.  Cardiac Output/Index by Albertina Hugger 4.09-1.86 RECOMMENDATIONS Plan is to transfer the patient to CVICU for ongoing care with advanced heart failure team consulting for helping out with treatment of heart failure.   Recommend uninterrupted dual antiplatelet therapy with Aspirin  81mg  daily and Clopidogrel  75mg  daily for a minimum of 12 months (ACS-Class I recommendation).   Recent IV heparin  2 hours post TR band removal Patient likely a good CVTS consultation candidate and therefore would strongly consider aspirin  Plavix  Will need to discuss with the remainder of the ICU team as to potential options for PCI would most likely targets being RI and LAD lesions that would likely require to minimum shockwave lithotripsy.  Major issue would be access for catheter advancement as the radial access will probably not tolerate 6 [place] guide  catheter. Restart IV heparin  2 hours after TR band removal Randene Bustard, MD  ECHOCARDIOGRAM COMPLETE Result Date: 09/07/2023    ECHOCARDIOGRAM REPORT   Patient Name:   [name] Date of Exam: 09/07/2023 Medical Rec #:  [id number]        Height:       66.0 in Accession #:    [id number]       Weight:       250.2 lb Date of Birth:  24-Aug-1955         BSA:          2.200 m Patient Age:    67 years         BP:           133/79 mmHg Patient Gender: F                HR:           90 bpm. Exam Location:  Inpatient Procedure: 2D Echo, Color Doppler, Cardiac Doppler and Intracardiac  Opacification Agent (Both Spectral and Color Flow Doppler were            utilized during procedure). Indications:    Elevated Troponins  History:        Patient has prior history of Echocardiogram examinations, most                 recent 01/10/2013. CHF, CAD; Risk Factors:Hypertension, Diabetes                 and Dyslipidemia.  Sonographer:    Sherline Distel Senior RDCS Referring Phys: [id number] Roxana Copier  Sonographer Comments: Technically difficult due to patient body habitus IMPRESSIONS  1. LVEF is difficult to quantify due to beat to beat variability (trigeminy throughout study). Left ventricular ejection fraction, by estimation, is 20 to 25%. The left ventricle has severely decreased function. The left ventricle demonstrates global hypokinesis. There is mild concentric left ventricular hypertrophy. Left ventricular diastolic parameters are consistent with Grade II diastolic dysfunction (pseudonormalization).  2. Right ventricular systolic function is mildly reduced. The right ventricular size is normal. There is moderately elevated pulmonary artery systolic pressure. The estimated right ventricular systolic pressure is 49.2 mmHg.  3. Left atrial size was mildly dilated.  4. The mitral valve is normal in structure. Mild to moderate mitral valve regurgitation.  5. Tricuspid valve regurgitation is moderate.  6. The aortic valve  is tricuspid. There is mild calcification of the aortic valve. Aortic valve regurgitation is not visualized.  7. The inferior vena cava is dilated in size with >50% respiratory variability, suggesting right atrial pressure of 8 mmHg.  8. Cannot exclude a small PFO. FINDINGS  Left Ventricle: LVEF is difficult to quantify due to beat to beat variability (trigeminy throughout study). Left ventricular ejection fraction, by estimation, is 20 to 25%. The left ventricle has severely decreased function. The left ventricle demonstrates global hypokinesis. Definity  contrast agent was given IV to delineate the left ventricular endocardial borders. The left ventricular internal cavity size was normal in size. There is mild concentric left ventricular hypertrophy. Left ventricular diastolic parameters are consistent with Grade II diastolic dysfunction (pseudonormalization). Right Ventricle: The right ventricular size is normal. No increase in right ventricular wall thickness. Right ventricular systolic function is mildly reduced. There is moderately elevated pulmonary artery systolic pressure. The tricuspid regurgitant velocity is 3.21 m/s, and with an assumed right atrial pressure of 8 mmHg, the estimated right ventricular systolic pressure is 49.2 mmHg. Left Atrium: Left atrial size was mildly dilated. Right Atrium: Right atrial size was normal in size. Pericardium: Trivial pericardial effusion is present. Mitral Valve: The mitral valve is normal in structure. Mild to moderate mitral valve regurgitation. Tricuspid Valve: The tricuspid valve is normal in structure. Tricuspid valve regurgitation is moderate. Aortic Valve: The aortic valve is tricuspid. There is mild calcification of the aortic valve. Aortic valve regurgitation is not visualized. Pulmonic Valve: The pulmonic valve was normal in structure. Pulmonic valve regurgitation is trivial. Aorta: The aortic root and ascending aorta are structurally normal, with no evidence  of dilitation. Venous: The inferior vena cava is dilated in size with greater than 50% respiratory variability, suggesting right atrial pressure of 8 mmHg. IAS/Shunts: Cannot exclude a small PFO.  LEFT VENTRICLE PLAX 2D LVIDd:         4.80 cm LVIDs:         4.50 cm LV PW:         1.00 cm LV IVS:  0.90 cm LVOT diam:     2.00 cm LV SV:         39 LV SV Index:   18 LVOT Area:     3.14 cm  RIGHT VENTRICLE RV S prime:     7.90 cm/s TAPSE (M-mode): 1.6 cm LEFT ATRIUM             Index        RIGHT ATRIUM           Index LA diam:        4.30 cm 1.95 cm/m   RA Area:     11.90 cm LA Vol (A2C):   54.0 ml 24.55 ml/m  RA Volume:   26.20 ml  11.91 ml/m LA Vol (A4C):   68.8 ml 31.28 ml/m LA Biplane Vol: 67.7 ml 30.78 ml/m  AORTIC VALVE LVOT Vmax:   75.05 cm/s LVOT Vmean:  50.050 cm/s LVOT VTI:    0.124 m  AORTA Ao Root diam: 2.90 cm Ao Asc diam:  3.20 cm TRICUSPID VALVE TR Peak grad:   41.2 mmHg TR Vmax:        321.00 cm/s  SHUNTS Systemic VTI:  0.12 m Systemic Diam: 2.00 cm Jules Oar MD Electronically signed by Jules Oar MD Signature Date/Time: 09/07/2023/11:22:18 AM    Final         Scheduled Meds:  aspirin   81 mg Oral Daily   atorvastatin   80 mg Oral Daily   Chlorhexidine  Gluconate Cloth  6 each Topical Daily   clopidogrel   75 mg Oral Daily   ferrous sulfate   325 mg Oral BID WC   insulin  aspart  0-15 Units Subcutaneous Q4H   insulin  glargine-yfgn  15 Units Subcutaneous BID   ipratropium-albuterol   3 mL Nebulization Once   metoprolol  succinate  25 mg Oral Daily   sodium chloride  flush  10-40 mL Intracatheter Q12H   sodium chloride  flush  3 mL Intravenous Q12H   Continuous Infusions:  heparin  1,500 Units/hr (09/09/23 0700)     LOS: 3 days    Time spent: 52 minutes    Vada Garibaldi, MD [facility]

## 2023-09-09 NOTE — Plan of Care (Signed)
  Problem: Tissue Perfusion: Goal: Adequacy of tissue perfusion will improve Outcome: Progressing   Problem: Pain Managment: Goal: General experience of comfort will improve and/or be controlled Outcome: Progressing   Problem: Safety: Goal: Ability to remain free from injury will improve Outcome: Progressing

## 2023-09-09 NOTE — Progress Notes (Addendum)
 Advanced Heart Failure Rounding Note  Cardiologist: None   Chief Complaint: Acute systolic CHF  Subjective:    Feeling better today. No dyspnea.   3L UOP last 24 hrs with IV lasix  160 BID + 5 mg metolazone .   Scr up slightly to 2.2   Objective:   Weight Range: 107.3 kg Body mass index is 38.18 kg/m.   Vital Signs:   Temp:  [98.3 F (36.8 C)-99 F (37.2 C)] 98.3 F (36.8 C) (04/23 1100) Pulse Rate:  [69-93] 88 (04/23 1059) Resp:  [21-34] 28 (04/23 0800) BP: (97-140)/(52-97) 135/64 (04/23 1059) SpO2:  [91 %-98 %] 98 % (04/23 0800) Weight:  [107.3 kg] 107.3 kg (04/23 0500) Last BM Date :  (PTA)  Weight change: Filed Weights   09/07/23 1920 09/08/23 0500 09/09/23 0500  Weight: 112.4 kg 107.7 kg 107.3 kg    Intake/Output:   Intake/Output Summary (Last 24 hours) at 09/09/2023 1107 Last data filed at 09/09/2023 0857 Gross per 24 hour  Intake 1053.22 ml  Output 2915 ml  Net -1861.78 ml      Physical Exam    General:  Chronically ill appearing Neck: JVP difficult d/t thick neck Cor: Regular rate & rhythm. No rubs, gallops or murmurs. Lungs: Diminished Abdomen: obese, soft nontender, nondistended.  Extremities: B/l AKA Neuro: Alert & orientedx3. Affect pleasant   Telemetry   SR 80s, less frequent PVCs  Labs    CBC Recent Labs    09/06/23 1442 09/07/23 0212 09/08/23 0325 09/09/23 0438  WBC 17.8*   < > 12.2* 13.3*  NEUTROABS 15.7*  --   --   --   HGB 11.6*   < > 10.0* 9.8*  HCT 39.9   < > 33.0* 31.8*  MCV 87.9   < > 83.8 82.8  PLT 271   < > 245 226   < > = values in this interval not displayed.   Basic Metabolic Panel Recent Labs    16/10/96 0212 09/07/23 1815 09/08/23 1740 09/09/23 0438  NA 140   < > 137 137  K 4.7   < > 4.0 3.8  CL 106   < > 99 97*  CO2 22   < > 26 26  GLUCOSE 221*   < > 145* 106*  BUN 24*   < > 31* 35*  CREATININE 1.50*   < > 2.14* 2.25*  CALCIUM  8.0*   < > 7.7* 7.6*  MG 1.9  --   --  2.2   < > = values in  this interval not displayed.   Liver Function Tests Recent Labs    09/06/23 1442 09/07/23 0212  AST 32 112*  ALT 18 37  ALKPHOS 106 129*  BILITOT 1.3* 0.7  PROT 5.7* 6.6  ALBUMIN  2.0* 2.6*   No results for input(s): "LIPASE", "AMYLASE" in the last 72 hours. Cardiac Enzymes No results for input(s): "CKTOTAL", "CKMB", "CKMBINDEX", "TROPONINI" in the last 72 hours.  BNP: BNP (last 3 results) Recent Labs    09/06/23 1404 09/07/23 0856  BNP 359.9* 1,077.5*    ProBNP (last 3 results) No results for input(s): "PROBNP" in the last 8760 hours.   D-Dimer No results for input(s): "DDIMER" in the last 72 hours. Hemoglobin A1C Recent Labs    09/07/23 0212  HGBA1C 7.3*   Fasting Lipid Panel Recent Labs    09/07/23 0856  CHOL 127  HDL 40*  LDLCALC 61  TRIG 045  CHOLHDL 3.2   Thyroid  Function Tests  No results for input(s): "TSH", "T4TOTAL", "T3FREE", "THYROIDAB" in the last 72 hours.  Invalid input(s): "FREET3"  Other results:   Imaging    No results found.   Medications:     Scheduled Medications:  aspirin   81 mg Oral Daily   atorvastatin   80 mg Oral Daily   Chlorhexidine  Gluconate Cloth  6 each Topical Daily   clopidogrel   75 mg Oral Daily   ferrous sulfate   325 mg Oral BID WC   insulin  aspart  0-15 Units Subcutaneous Q4H   insulin  glargine-yfgn  15 Units Subcutaneous BID   ipratropium-albuterol   3 mL Nebulization Once   metoprolol  succinate  25 mg Oral Daily   sodium chloride  flush  10-40 mL Intracatheter Q12H   sodium chloride  flush  3 mL Intravenous Q12H    Infusions:  heparin  1,500 Units/hr (09/09/23 0700)    PRN Medications: acetaminophen  **OR** acetaminophen , melatonin, ondansetron  (ZOFRAN ) IV, polyethylene glycol, senna-docusate, sodium chloride  flush, sodium chloride  flush    Patient Profile   68 y.o. female with prior history of nonobstructive CAD, HF with recovered EF, insulin  dependent DM II, bilateral AKA, hx CVA.    Admitted  with NSTEMI and acute respiratory failure 2/2 CHF.  Assessment/Plan   NSTEMI: -HS troponin > 24k -LHC with multivessel CAD. Not a CABG candidate. Reviewed images with interventional. No good targets for PCI either, no angina and has AKI. Will manage medically. -DAPT with aspirin  and plavix . Stop heparin  gtt today -High-intensity statin -Chest pain free   2. HF with recovered EF >> acute systolic CHF -Echo with EF 20-25%, grade II DD, RV mildly reduced, RVSP 49 mmHg, moderate TR, dilated IVC -RHC PA mean 44, PCWP 44, CI 1.86. Difficult case, unable to get obtain additional values -Volume very difficult on exam -Volume looks good on exam. Scr up. Stop IV diuretics.  -Start Toprol  XL 25 daily -Poor SGLT2i candidate -Hopefully add ARB tomorrow if Cr improving -Multiple comorbidities and is extremely frail. Not a candidate for advanced therapies or inotropes. Does not need follow-up with [facility].   3. PVCs - Burden improving on telemetry - Keep K > 4 and Mag > 2   4. Insulin  dependent DM II -A1c 7.3% -Per primary   5. AKI - Scr 1.2>1.76>2.25 - Suspect cardiorenal + contrast exposure - Hold diuretics today  6. Anemia - Check iron  studies  Okay to transfer out of the ICU today. [facility] to follow out of the ICU.  Length of Stay: 3  Louay Myrie N, PA-C  09/09/2023, 11:07 AM  Advanced Heart Failure Team Pager [phone] (M-F; 7a - 5p)  Please contact [facility] for night-coverage after hours (5p -7a ) and weekends on [email or web address]

## 2023-09-10 ENCOUNTER — Inpatient Hospital Stay (HOSPITAL_COMMUNITY)

## 2023-09-10 DIAGNOSIS — I214 Non-ST elevation (NSTEMI) myocardial infarction: Secondary | ICD-10-CM | POA: Diagnosis not present

## 2023-09-10 DIAGNOSIS — I5021 Acute systolic (congestive) heart failure: Secondary | ICD-10-CM | POA: Diagnosis not present

## 2023-09-10 LAB — CBC
HCT: 30.6 % — ABNORMAL LOW (ref 36.0–46.0)
Hemoglobin: 9.5 g/dL — ABNORMAL LOW (ref 12.0–15.0)
MCH: 25.5 pg — ABNORMAL LOW (ref 26.0–34.0)
MCHC: 31 g/dL (ref 30.0–36.0)
MCV: 82 fL (ref 80.0–100.0)
Platelets: 229 10*3/uL (ref 150–400)
RBC: 3.73 MIL/uL — ABNORMAL LOW (ref 3.87–5.11)
RDW: 18.4 % — ABNORMAL HIGH (ref 11.5–15.5)
WBC: 12.2 10*3/uL — ABNORMAL HIGH (ref 4.0–10.5)
nRBC: 0 % (ref 0.0–0.2)

## 2023-09-10 LAB — BASIC METABOLIC PANEL WITH GFR
Anion gap: 12 (ref 5–15)
BUN: 40 mg/dL — ABNORMAL HIGH (ref 8–23)
CO2: 27 mmol/L (ref 22–32)
Calcium: 7.4 mg/dL — ABNORMAL LOW (ref 8.9–10.3)
Chloride: 94 mmol/L — ABNORMAL LOW (ref 98–111)
Creatinine, Ser: 2.52 mg/dL — ABNORMAL HIGH (ref 0.44–1.00)
GFR, Estimated: 20 mL/min — ABNORMAL LOW (ref >60)
Glucose, Bld: 92 mg/dL (ref 70–99)
Potassium: 3.6 mmol/L (ref 3.5–5.1)
Sodium: 133 mmol/L — ABNORMAL LOW (ref 135–145)

## 2023-09-10 LAB — GLUCOSE, CAPILLARY
Glucose-Capillary: 100 mg/dL — ABNORMAL HIGH (ref 70–99)
Glucose-Capillary: 101 mg/dL — ABNORMAL HIGH (ref 70–99)
Glucose-Capillary: 165 mg/dL — ABNORMAL HIGH (ref 70–99)
Glucose-Capillary: 152 mg/dL — ABNORMAL HIGH (ref 70–99)
Glucose-Capillary: 146 mg/dL — ABNORMAL HIGH (ref 70–99)

## 2023-09-10 LAB — HEPARIN LEVEL (UNFRACTIONATED): Heparin Unfractionated: <0.1 [IU]/mL — ABNORMAL LOW (ref 0.30–0.70)

## 2023-09-10 MED ORDER — IRON SUCROSE 500 MG IVPB - SIMPLE MED
500.0000 mg | INTRAVENOUS | Status: DC
  Administered 2023-09-10: 500 mg via INTRAVENOUS
  Filled 2023-09-10 (×4): qty 275

## 2023-09-10 MED ORDER — ORAL CARE MOUTH RINSE: 15.0000 mL | OROMUCOSAL | Status: DC | PRN

## 2023-09-10 MED ORDER — POLYETHYLENE GLYCOL 3350 17 G PO PACK
17.0000 g | PACK | Freq: Every day | ORAL | Status: DC
  Administered 2023-09-10 – 2023-09-14 (×3): 17 g via ORAL
  Filled 2023-09-10 (×5): qty 1

## 2023-09-10 MED ORDER — ISOSORBIDE DINITRATE 10 MG PO TABS
10.0000 mg | ORAL_TABLET | Freq: Two times a day (BID) | ORAL | Status: DC
  Administered 2023-09-10 – 2023-09-15 (×11): 10 mg via ORAL
  Filled 2023-09-10 (×12): qty 1

## 2023-09-10 MED ORDER — POTASSIUM CHLORIDE CRYS ER 20 MEQ PO TBCR
20.0000 meq | EXTENDED_RELEASE_TABLET | Freq: Once | ORAL | Status: AC
  Administered 2023-09-10: 20 meq via ORAL
  Filled 2023-09-10: qty 1

## 2023-09-10 MED ORDER — HYDRALAZINE HCL 25 MG PO TABS
25.0000 mg | ORAL_TABLET | Freq: Three times a day (TID) | ORAL | Status: DC
Start: 2023-09-10 — End: 2023-09-16
  Administered 2023-09-10 – 2023-09-16 (×18): 25 mg via ORAL
  Filled 2023-09-10 (×19): qty 1

## 2023-09-10 MED ORDER — SENNOSIDES-DOCUSATE SODIUM 8.6-50 MG PO TABS
1.0000 | ORAL_TABLET | Freq: Two times a day (BID) | ORAL | Status: DC
  Administered 2023-09-10 – 2023-09-16 (×12): 1 via ORAL
  Filled 2023-09-10 (×13): qty 1

## 2023-09-10 NOTE — Progress Notes (Signed)
 Advanced Heart Failure Rounding Note  Cardiologist: None   Chief Complaint: Acute systolic CHF  Subjective:    No chest pain or dyspnea at rest.   CVP 5  Scr up further, 2.25>2.52  SBP 120s-130s   Objective:   Weight Range: 107.8 kg Body mass index is 38.36 kg/m.   Vital Signs:   Temp:  [98.2 F (36.8 C)-99.2 F (37.3 C)] 98.8 F (37.1 C) (04/24 0700) Pulse Rate:  [76-88] 80 (04/24 1100) Resp:  [18-38] 21 (04/24 1100) BP: (114-145)/(50-90) 123/87 (04/24 1100) SpO2:  [88 %-98 %] 93 % (04/24 1100) Weight:  [107.8 kg] 107.8 kg (04/24 0424) Last BM Date :  (PTA)  Weight change: Filed Weights   09/08/23 0500 09/09/23 0500 09/10/23 0424  Weight: 107.7 kg 107.3 kg 107.8 kg    Intake/Output:   Intake/Output Summary (Last 24 hours) at 09/10/2023 1205 Last data filed at 09/10/2023 1110 Gross per 24 hour  Intake 908.38 ml  Output 2050 ml  Net -1141.62 ml      Physical Exam   General:  Chronically ill appearing female Neck: Unable to assess JVP d/t thick neck Cor: Regular rate & rhythm. No rubs, gallops or murmurs. Lungs: clear Abdomen: obese, soft, nontender, nondistended.  Extremities: b/l AKA Neuro: alert & orientedx3. Affect flat  Telemetry   SR 70s-80s, PVCs  Labs    CBC Recent Labs    09/09/23 0438 09/10/23 0415  WBC 13.3* 12.2*  HGB 9.8* 9.5*  HCT 31.8* 30.6*  MCV 82.8 82.0  PLT 226 229   Basic Metabolic Panel Recent Labs    24/40/10 0438 09/10/23 0415  NA 137 133*  K 3.8 3.6  CL 97* 94*  CO2 26 27  GLUCOSE 106* 92  BUN 35* 40*  CREATININE 2.25* 2.52*  CALCIUM  7.6* 7.4*  MG 2.2  --    Liver Function Tests No results for input(s): "AST", "ALT", "ALKPHOS", "BILITOT", "PROT", "ALBUMIN " in the last 72 hours.  No results for input(s): "LIPASE", "AMYLASE" in the last 72 hours. Cardiac Enzymes No results for input(s): "CKTOTAL", "CKMB", "CKMBINDEX", "TROPONINI" in the last 72 hours.  BNP: BNP (last 3 results) Recent Labs     09/06/23 1404 09/07/23 0856  BNP 359.9* 1,077.5*    ProBNP (last 3 results) No results for input(s): "PROBNP" in the last 8760 hours.   D-Dimer No results for input(s): "DDIMER" in the last 72 hours. Hemoglobin A1C No results for input(s): "HGBA1C" in the last 72 hours.  Fasting Lipid Panel No results for input(s): "CHOL", "HDL", "LDLCALC", "TRIG", "CHOLHDL", "LDLDIRECT" in the last 72 hours.  Thyroid  Function Tests No results for input(s): "TSH", "T4TOTAL", "T3FREE", "THYROIDAB" in the last 72 hours.  Invalid input(s): "FREET3"  Other results:   Imaging    No results found.   Medications:     Scheduled Medications:  aspirin   81 mg Oral Daily   atorvastatin   80 mg Oral Daily   Chlorhexidine  Gluconate Cloth  6 each Topical Daily   clopidogrel   75 mg Oral Daily   ferrous sulfate   325 mg Oral BID WC   hydrALAZINE   25 mg Oral Q8H   insulin  aspart  0-15 Units Subcutaneous Q4H   insulin  glargine-yfgn  15 Units Subcutaneous BID   ipratropium-albuterol   3 mL Nebulization Once   isosorbide  dinitrate  10 mg Oral BID   metoprolol  succinate  25 mg Oral Daily   polyethylene glycol  17 g Oral Daily   senna-docusate  1 tablet Oral  BID   sodium chloride  flush  10-40 mL Intracatheter Q12H   sodium chloride  flush  3 mL Intravenous Q12H    Infusions:  iron  sucrose      PRN Medications: acetaminophen  **OR** acetaminophen , ipratropium-albuterol , melatonin, ondansetron  (ZOFRAN ) IV, mouth rinse, sodium chloride  flush, sodium chloride  flush    Patient Profile   68 y.o. female with prior history of nonobstructive CAD, HF with recovered EF, insulin  dependent DM II, bilateral AKA, hx CVA, CKD IIIa.    Admitted with NSTEMI and acute respiratory failure 2/2 CHF.  Assessment/Plan   NSTEMI: -HS troponin > 24k -LHC with multivessel CAD. Not a CABG candidate. Reviewed images with interventional. No good targets for PCI either, no angina and has AKI. Will manage  medically. -DAPT with aspirin  and plavix .  -High-intensity statin -No angina.   2. HF with recovered EF >> acute systolic CHF -Echo with EF 20-25%, grade II DD, RV mildly reduced, RVSP 49 mmHg, moderate TR, dilated IVC -RHC PA mean 44, PCWP 44, CI 1.86. Difficult case, unable to obtain additional values Volume very difficult on exam but does not appear significantly overloaded. CVP 5. Hold off on diuretics especially with AKI. -Start hydralazine  25 TID + Isordil  10 BID, can eventually switch to ARB as renal function recovers -Start Toprol  XL 25 daily -Poor SGLT2i candidate -Multiple comorbidities and is extremely frail. Not a candidate for advanced therapies or inotropes. Does not need follow-up with [facility].   3. PVCs - Burden improved - Keep K > 4 and Mag > 2   4. Insulin  dependent DM II -A1c 7.3% -Per primary   5. AKI on CKD IIIa - Scr 1.2>1.76>2.25>2.5 - Suspect cardiorenal + contrast exposure - Hold diuretics as above - Continue to follow  6. Iron  deficiency anemia - T sat 12 and ferritin 275 - Will give IV iron   Has transfer orders. Awaiting bed in progressive unit. [facility] to assume rounds out of ICU.  Length of Stay: 4  Paitlyn Mcclatchey N, PA-C  09/10/2023, 12:05 PM  Advanced Heart Failure Team Pager [phone] (M-F; 7a - 5p)  Please contact [facility] for night-coverage after hours (5p -7a ) and weekends on [email or web address]

## 2023-09-10 NOTE — Progress Notes (Signed)
 OT Cancellation Note  Patient Details Name: [name] MRN: [id number] DOB: 06-09-1955   Cancelled Treatment:    Reason Eval/Treat Not Completed: OT screened, no needs identified, will sign off.  Please refer to evaluation 4/22. Patient is dependent in all mobility and bathing/dressing. She does self-feed and does some grooming tasks which she is currently able to complete. She is a long-term care resident. She has no skilled OT needs.   Ebony Goldstein Arica [place] 09/10/2023, 8:06 AM  [place] OTR/L Acute Rehabilitation Services Office: [phone]

## 2023-09-10 NOTE — Plan of Care (Signed)

## 2023-09-10 NOTE — TOC Progression Note (Signed)
 Transition of Care [facility]) - Progression Note    Patient Details  Name: DANESHA KIRCHOFF MRN: [id number] Date of Birth: 1955-10-28  Transition of Care [facility]) CM/SW Contact  Ernst Heap Phone Number: [phone] 09/10/2023, 2:36 PM  Clinical Narrative:   HF CSW reached out to Crystal from [facility] to inform them that the patient may be medically ready for dc 4/25.   HF CSW completed the patient FL2 and faxed out to [facility] where patient resides for LTC.   TOC will continue following.     Expected Discharge Plan: Skilled Nursing Facility Barriers to Discharge: Continued Medical Work up, English as a second language teacher  Expected Discharge Plan and Services In-house Referral: Clinical Social Work   Post Acute Care Choice: Skilled Nursing Facility Living arrangements for the past 2 months: Skilled Nursing Facility                                       Social Determinants of Health (SDOH) Interventions SDOH Screenings   Food Insecurity: No Food Insecurity (09/08/2023)  Housing: Unknown (09/08/2023)  Transportation Needs: No Transportation Needs (09/08/2023)  Utilities: Not At Risk (09/06/2023)  Social Connections: Unknown (09/06/2023)  Tobacco Use: Medium Risk (09/07/2023)    Readmission Risk Interventions     No data to display

## 2023-09-10 NOTE — Progress Notes (Signed)
 PT Cancellation/Discharge Note  Patient Details Name: NYAZIA CANEVARI MRN: [id number] DOB: Feb 11, 1956   Cancelled Treatment:    Reason Eval/Treat Not Completed: PT screened, no needs identified, will sign off  Please refer to evaluation 4/22. Patient is dependent in all mobility and is a long-term care resident. She has no skilled PT needs.    Gayle Kava, PT Acute Rehabilitation Services  Office [phone]  [place] Leep 09/10/2023, 8:01 AM

## 2023-09-10 NOTE — NC FL2 (Signed)
 [place]  MEDICAID FL2 LEVEL OF CARE FORM     IDENTIFICATION  Patient Name: [name] Birthdate: 12-16-1955 Sex: female Admission Date (Current Location): 09/06/2023  [facility] and [place] Number:  Producer, television/film/video and Address:  [facility], [address] [place], [place] [address]      Provider Number: [id number]  Attending Physician Name and Address:  Vada Garibaldi, MD  Relative Name and Phone Number:  Maralee Senate [phone]    Current Level of Care: Hospital Recommended Level of Care: Skilled Nursing Facility Prior Approval Number:    Date Approved/Denied:   PASRR Number: [id number]  Discharge Plan: SNF    Current Diagnoses: Patient Active Problem List   Diagnosis Date Noted   Acute renal failure superimposed on stage 3a chronic kidney disease (HCC) 09/08/2023   Leukocytosis 09/07/2023   Chronic diastolic CHF (congestive heart failure) (HCC) 09/07/2023   DM2 (diabetes mellitus, type 2) (HCC) 09/07/2023   Chronic iron  deficiency anemia 09/07/2023   Acute on chronic combined systolic and diastolic CHF (congestive heart failure) (HCC) 09/07/2023   NSTEMI (non-ST elevated myocardial infarction) (HCC) 09/06/2023   Heme positive stool    Rectal polyp    Adenomatous polyp of transverse colon    Gastritis and gastroduodenitis    Phantom limb pain (HCC) 11/09/2017   Other abnormalities of gait and mobility 11/09/2017   S/P BKA (below knee amputation) bilateral (HCC) 08/20/2015   Dental caries 08/20/2015   Constipation 08/15/2015   Depression 08/15/2015   Pain in shoulder 06/28/2015   Hemiparesis (HCC) 01/10/2013   Tobacco abuse    Paresthesia of right arm and leg 12/17/2011   Facial droop 12/15/2011   TIA (transient ischemic attack) 12/15/2011   Syncope 07/02/2011   HTN (hypertension) 07/02/2011   Lower urinary tract infectious disease 07/02/2011   Normocytic anemia 07/02/2011   Hereditary and idiopathic peripheral  neuropathy 04/02/2009   Essential hypertension 04/02/2009   CAD, NATIVE VESSEL 04/02/2009   HIATAL HERNIA 04/02/2009   Irritable bowel syndrome 04/02/2009   HLD (hyperlipidemia) 05/30/2008   OBESITY 05/30/2008   LEFT VENTRICULAR FUNCTION, DECREASED 05/30/2008   CVA (cerebral infarction) 05/30/2008   GERD 05/30/2008    Orientation RESPIRATION BLADDER Height & Weight     Self, Time, Situation, Place  O2 Continent Weight: 237 lb 10.5 oz (107.8 kg) Height:  5\' 6"  (167.6 cm)  BEHAVIORAL SYMPTOMS/MOOD NEUROLOGICAL BOWEL NUTRITION STATUS      Continent Diet (See dc summary)  AMBULATORY STATUS COMMUNICATION OF NEEDS Skin   Limited Assist Verbally PU Stage and Appropriate Care (Pressure Injury 09/07/23 Sacrum Right;Left;Mid Stage 1 - Intact skin with non-blanchable redness of a localized area usually over a bony prominence.)                       Personal Care Assistance Level of Assistance  Bathing, Dressing, Total care Bathing Assistance: Limited assistance   Dressing Assistance: Limited assistance Total Care Assistance: Limited assistance   Functional Limitations Info  Sight, Hearing, Speech Sight Info: Impaired Hearing Info: Adequate Speech Info: Adequate    SPECIAL CARE FACTORS FREQUENCY  PT (By licensed PT), OT (By licensed OT)     PT Frequency: 5x weekly OT Frequency: 5x weekly            Contractures      Additional Factors Info  Code Status, Allergies Code Status Info: Full Allergies Info: Penicillins, Banana,Strawberry Extract  Current Medications (09/10/2023):  This is the current hospital active medication list Current Facility-Administered Medications  Medication Dose Route Frequency Provider Last Rate Last Admin   acetaminophen  (TYLENOL ) tablet 650 mg  650 mg Oral Q6H PRN Arleen Lacer, MD   650 mg at 09/07/23 0425   Or   acetaminophen  (TYLENOL ) suppository 650 mg  650 mg Rectal Q6H PRN Arleen Lacer, MD       aspirin  chewable  tablet 81 mg  81 mg Oral Daily Arleen Lacer, MD   81 mg at 09/10/23 0930   atorvastatin  (LIPITOR ) tablet 80 mg  80 mg Oral Daily Arleen Lacer, MD   80 mg at 09/10/23 0930   Chlorhexidine  Gluconate Cloth 2 % PADS 6 each  6 each Topical Daily Lauralee Poll, MD   6 each at 09/10/23 0931   clopidogrel  (PLAVIX ) tablet 75 mg  75 mg Oral Daily Arleen Lacer, MD   75 mg at 09/10/23 0930   ferrous sulfate  tablet 325 mg  325 mg Oral BID WC Arleen Lacer, MD   325 mg at 09/10/23 1610   hydrALAZINE  (APRESOLINE ) tablet 25 mg  25 mg Oral Q8H Stoner, Benjamin J, MD       insulin  aspart (novoLOG ) injection 0-15 Units  0-15 Units Subcutaneous Q4H Arleen Lacer, MD   3 Units at 09/10/23 1116   insulin  glargine-yfgn (SEMGLEE ) injection 15 Units  15 Units Subcutaneous BID Arleen Lacer, MD   15 Units at 09/10/23 0931   ipratropium-albuterol  (DUONEB) 0.5-2.5 (3) MG/3ML nebulizer solution 3 mL  3 mL Nebulization Once Arleen Lacer, MD       ipratropium-albuterol  (DUONEB) 0.5-2.5 (3) MG/3ML nebulizer solution 3 mL  3 mL Nebulization Q4H PRN Vada Garibaldi, MD   3 mL at 09/09/23 1618   iron  sucrose (VENOFER ) 500 mg in sodium chloride  0.9 % 250 mL IVPB  500 mg Intravenous Q24H Lauralee Poll, MD 60.3 mL/hr at 09/10/23 1346 500 mg at 09/10/23 1346   isosorbide  dinitrate (ISORDIL ) tablet 10 mg  10 mg Oral BID Stoner, Benjamin J, MD   10 mg at 09/10/23 9604   melatonin tablet 3 mg  3 mg Oral QHS PRN Arleen Lacer, MD       metoprolol  succinate (TOPROL -XL) 24 hr tablet 25 mg  25 mg Oral Daily Stoner, Benjamin J, MD   25 mg at 09/10/23 0930   ondansetron  (ZOFRAN ) injection 4 mg  4 mg Intravenous Q6H PRN Arleen Lacer, MD       Oral care mouth rinse  15 mL Mouth Rinse PRN Ghimire, Kuber, MD       polyethylene glycol (MIRALAX  / GLYCOLAX ) packet 17 g  17 g Oral Daily Albino Alu, RPH   17 g at 09/10/23 0930   potassium chloride  SA (KLOR-CON  M) CR tablet 20 mEq  20 mEq Oral Once Ghimire,  Kuber, MD       senna-docusate (Senokot-S) tablet 1 tablet  1 tablet Oral BID Albino Alu, RPH   1 tablet at 09/10/23 0930   sodium chloride  flush (NS) 0.9 % injection 10-40 mL  10-40 mL Intracatheter Q12H Arleene Belt, PA-C   10 mL at 09/10/23 5409   sodium chloride  flush (NS) 0.9 % injection 10-40 mL  10-40 mL Intracatheter PRN Arleene Belt, PA-C       sodium chloride  flush (NS) 0.9 % injection 3 mL  3 mL Intravenous Q12H Arleen Lacer, MD  3 mL at 09/10/23 0931   sodium chloride  flush (NS) 0.9 % injection 3 mL  3 mL Intravenous PRN Arleen Lacer, MD         Discharge Medications: Please see discharge summary for a list of discharge medications.  Relevant Imaging Results:  Relevant Lab Results:   Additional Information SSN: [id number]  Murphy Arn, LCSWA

## 2023-09-10 NOTE — Progress Notes (Signed)
 PROGRESS NOTE    [name]  ZOX:[id number] DOB: 02-21-1956 DOA: 09/06/2023 PCP: Marquetta Sit, MD    Brief Narrative:  Patient is 68 year old female with history of chronic diastolic heart failure, type 2 diabetes on insulin , diabetic polyneuropathy, bilateral BKA and bed bound status  long-term nursing home resident, history of hypertension, hyperlipidemia, chronic iron -deficiency anemia presented to the hospital with shortness of breath from nursing home and found to have non-STEMI.  Routine vital signs at the nursing home revealed low oxygen saturations at 80% on room air.  This prompted her to send to ER.  Patient without particular symptoms but with vague symptoms. In the emergency room on 2 L oxygen.  Troponins [id number].  Started on heparin  infusion, admitted to hospital with cardiology consultation. 4/21, cardiac catheterization with extensive coronary artery disease not amenable to PCI and she is not a candidate for CABG. Also diuresed and followed by heart failure team.  Subjective:  Patient seen and examined.  No overnight events.  Patient tells me she feels fine.  Denies any chest pain or shortness of breath.  She feels like she is back to baseline. Creatinine 2.52. Urine output 2.5 L last 24 hours.  Assessment & Plan:   Non-STEMI: Presented with shortness of breath. Cardiac catheterization with severe multivessel CAD. Remains on DAPT and high intensity statin.  Completed 48 hours of heparin  infusion. Patient is not a candidate for PCI.  She is not a good candidate for CABG.  Medical management advised.  Acute on chronic systolic and diastolic congestive heart failure: She has history of diastolic heart failure.  Repeat echocardiogram consistent with ejection fraction 20 to 25% and grade 2 diastolic dysfunction.  Patient was treated with IV diuresis and metolazone .  Started on spironolactone .  Creatinine up today.  Diuretics on hold. Followed by heart failure  team.  Type 2 diabetes: Well-controlled.  A1c 7.3.  On Lantus  that she takes at nursing home.  AKI on CKD stage IIIa: Recent baseline creatinine of 1.23.  Creatinine gradually going up 1.76-2.25-2.5.  Diuretics on hold today.  Recheck tomorrow morning.  Essential hypertension, stable Hyperlipidemia, stable Chronic iron  deficiency anemia, stable Bilateral amputee, long-term nursing home resident  Pressure Injury 09/07/23 Sacrum Right;Left;Mid Stage 1 -  Intact skin with non-blanchable redness of a localized area usually over a bony prominence. (Active)  09/07/23 0020  Location: Sacrum  Location Orientation: Right;Left;Mid  Staging: Stage 1 -  Intact skin with non-blanchable redness of a localized area usually over a bony prominence.  Wound Description (Comments):   Present on Admission: Yes        DVT prophylaxis: SCDs Start: 09/06/23 2052   Code Status: Full code Family Communication: None at the bedside Disposition Plan: Status is: Inpatient Remains inpatient appropriate because: IV diuresis, monitoring renal functions.  Needs to go back to long-term care.     Consultants:  Cardiology  Procedures:  Cardiac cath  Antimicrobials:  None     Objective: Vitals:   09/10/23 0805 09/10/23 0900 09/10/23 1000 09/10/23 1100  BP: 131/68 (!) 118/54 126/78 123/87  Pulse: 85 82 84 80  Resp: (!) 22 (!) 23 (!) 31 (!) 21  Temp:      TempSrc:      SpO2: 94% 96% 93% 93%  Weight:      Height:        Intake/Output Summary (Last 24 hours) at 09/10/2023 1141 Last data filed at 09/10/2023 1110 Gross per 24 hour  Intake 908.38 ml  Output 2200 ml  Net -1291.62 ml   Filed Weights   09/08/23 0500 09/09/23 0500 09/10/23 0424  Weight: 107.7 kg 107.3 kg 107.8 kg    Examination:  General: Chronically sick looking.  Frail and debilitated.  Not in any distress.  On minimal oxygen.  Pleasant and interactive. Cardiovascular: S1-S2 normal.  Regular rate rhythm. Respiratory:  Bilateral clear.  No added sounds. SpO2: 93 % O2 Flow Rate (L/min): 3 L/min FiO2 (%): 60 %  Gastrointestinal: Soft.  Obese and pendulous.  Nontender.  Bowel sound present. Ext: No swelling or edema.  Bilateral BKA stump clean and dry. Neuro: Intact.     Data Reviewed: I have personally reviewed following labs and imaging studies  CBC: Recent Labs  Lab 09/06/23 1442 09/07/23 0212 09/07/23 1815 09/07/23 1823 09/07/23 1844 09/08/23 0325 09/09/23 0438 09/10/23 0415  WBC 17.8* 15.2*  --   --   --  12.2* 13.3* 12.2*  NEUTROABS 15.7*  --   --   --   --   --   --   --   HGB 11.6* 11.2*   < > 11.9* 10.9* 10.0* 9.8* 9.5*  HCT 39.9 37.8   < > 35.0* 32.0* 33.0* 31.8* 30.6*  MCV 87.9 85.3  --   --   --  83.8 82.8 82.0  PLT 271 224  --   --   --  245 226 229   < > = values in this interval not displayed.   Basic Metabolic Panel: Recent Labs  Lab 09/07/23 0212 09/07/23 1815 09/07/23 1844 09/08/23 0325 09/08/23 1740 09/09/23 0438 09/10/23 0415  NA 140   < > 140 139 137 137 133*  K 4.7   < > 4.6 4.3 4.0 3.8 3.6  CL 106  --   --  104 99 97* 94*  CO2 22  --   --  24 26 26 27   GLUCOSE 221*  --   --  168* 145* 106* 92  BUN 24*  --   --  30* 31* 35* 40*  CREATININE 1.50*  --   --  1.76* 2.14* 2.25* 2.52*  CALCIUM  8.0*  --   --  7.6* 7.7* 7.6* 7.4*  MG 1.9  --   --   --   --  2.2  --    < > = values in this interval not displayed.   GFR: Estimated Creatinine Clearance: 26.5 mL/min (A) (by C-G formula based on SCr of 2.52 mg/dL (H)). Liver Function Tests: Recent Labs  Lab 09/06/23 1442 09/07/23 0212  AST 32 112*  ALT 18 37  ALKPHOS 106 129*  BILITOT 1.3* 0.7  PROT 5.7* 6.6  ALBUMIN  2.0* 2.6*   No results for input(s): "LIPASE", "AMYLASE" in the last 168 hours. No results for input(s): "AMMONIA" in the last 168 hours. Coagulation Profile: No results for input(s): "INR", "PROTIME" in the last 168 hours. Cardiac Enzymes: No results for input(s): "CKTOTAL", "CKMB",  "CKMBINDEX", "TROPONINI" in the last 168 hours. BNP (last 3 results) No results for input(s): "PROBNP" in the last 8760 hours. HbA1C: No results for input(s): "HGBA1C" in the last 72 hours.  CBG: Recent Labs  Lab 09/09/23 2004 09/09/23 2327 09/10/23 0348 09/10/23 0725 09/10/23 1113  GLUCAP 187* 156* 100* 101* 165*   Lipid Profile: No results for input(s): "CHOL", "HDL", "LDLCALC", "TRIG", "CHOLHDL", "LDLDIRECT" in the last 72 hours.  Thyroid  Function Tests: No results for input(s): "TSH", "T4TOTAL", "FREET4", "T3FREE", "THYROIDAB" in the last 72 hours.  Anemia Panel: Recent Labs    09/09/23 1101  VITAMINB12 578  FOLATE 12.8  FERRITIN 275  TIBC 209*  IRON  24*  RETICCTPCT 1.3   Sepsis Labs: Recent Labs  Lab 09/07/23 0211  PROCALCITON 0.10    Recent Results (from the past 240 hours)  Resp panel by RT-PCR (RSV, Flu A&B, Covid) Anterior Nasal Swab     Status: None   Collection Time: 09/06/23  2:05 PM   Specimen: Anterior Nasal Swab  Result Value Ref Range Status   SARS Coronavirus 2 by RT PCR NEGATIVE NEGATIVE Final   Influenza A by PCR NEGATIVE NEGATIVE Final   Influenza B by PCR NEGATIVE NEGATIVE Final    Comment: (NOTE) The Xpert Xpress SARS-CoV-2/FLU/RSV plus assay is intended as an aid in the diagnosis of influenza from Nasopharyngeal swab specimens and should not be used as a sole basis for treatment. Nasal washings and aspirates are unacceptable for Xpert Xpress SARS-CoV-2/FLU/RSV testing.  Fact Sheet for Patients: BloggerCourse.com  Fact Sheet for Healthcare Providers: SeriousBroker.it  This test is not yet approved or cleared by the [place]  FDA and has been authorized for detection and/or diagnosis of SARS-CoV-2 by FDA under an Emergency Use Authorization (EUA). This EUA will remain in effect (meaning this test can be used) for the duration of the COVID-19 declaration under Section 564(b)(1) of  the Act, 21 U.S.C. section 360bbb-3(b)(1), unless the authorization is terminated or revoked.     Resp Syncytial Virus by PCR NEGATIVE NEGATIVE Final    Comment: (NOTE) Fact Sheet for Patients: BloggerCourse.com  Fact Sheet for Healthcare Providers: SeriousBroker.it  This test is not yet approved or cleared by the [place]  FDA and has been authorized for detection and/or diagnosis of SARS-CoV-2 by FDA under an Emergency Use Authorization (EUA). This EUA will remain in effect (meaning this test can be used) for the duration of the COVID-19 declaration under Section 564(b)(1) of the Act, 21 U.S.C. section 360bbb-3(b)(1), unless the authorization is terminated or revoked.  Performed at [facility], [address] [place], [place] [address]   MRSA Next Gen by PCR, Nasal     Status: None   Collection Time: 09/07/23  1:10 AM   Specimen: Nasal Mucosa; Nasal Swab  Result Value Ref Range Status   MRSA by PCR Next Gen NOT DETECTED NOT DETECTED Final    Comment: (NOTE) The GeneXpert MRSA Assay (FDA approved for NASAL specimens only), is one component of a comprehensive MRSA colonization surveillance program. It is not intended to diagnose MRSA infection nor to guide or monitor treatment for MRSA infections. Test performance is not FDA approved in patients less than 32 years old. Performed at [facility], [address], [place], [place] [address]   MRSA Next Gen by PCR, Nasal     Status: None   Collection Time: 09/07/23  7:32 PM   Specimen: Nasal Mucosa; Nasal Swab  Result Value Ref Range Status   MRSA by PCR Next Gen NOT DETECTED NOT DETECTED Final    Comment: (NOTE) The GeneXpert MRSA Assay (FDA approved for NASAL specimens only), is one component of a comprehensive MRSA colonization surveillance program. It is not intended to diagnose MRSA infection nor to guide or monitor treatment for MRSA infections. Test  performance is not FDA approved in patients less than 80 years old. Performed at [facility], [address] [place], [place] [address]          Radiology Studies: No  results found.       Scheduled Meds:  aspirin   81 mg Oral Daily   atorvastatin   80 mg Oral Daily   Chlorhexidine  Gluconate Cloth  6 each Topical Daily   clopidogrel   75 mg Oral Daily   ferrous sulfate   325 mg Oral BID WC   hydrALAZINE   25 mg Oral Q8H   insulin  aspart  0-15 Units Subcutaneous Q4H   insulin  glargine-yfgn  15 Units Subcutaneous BID   ipratropium-albuterol   3 mL Nebulization Once   isosorbide  dinitrate  10 mg Oral BID   metoprolol  succinate  25 mg Oral Daily   polyethylene glycol  17 g Oral Daily   senna-docusate  1 tablet Oral BID   sodium chloride  flush  10-40 mL Intracatheter Q12H   sodium chloride  flush  3 mL Intravenous Q12H   Continuous Infusions:  iron  sucrose       LOS: 4 days      Promise Weldin, MD [facility]

## 2023-09-11 ENCOUNTER — Inpatient Hospital Stay (HOSPITAL_COMMUNITY)

## 2023-09-11 DIAGNOSIS — I214 Non-ST elevation (NSTEMI) myocardial infarction: Secondary | ICD-10-CM | POA: Diagnosis not present

## 2023-09-11 LAB — GLUCOSE, CAPILLARY
Glucose-Capillary: 129 mg/dL — ABNORMAL HIGH (ref 70–99)
Glucose-Capillary: 139 mg/dL — ABNORMAL HIGH (ref 70–99)
Glucose-Capillary: 147 mg/dL — ABNORMAL HIGH (ref 70–99)
Glucose-Capillary: 164 mg/dL — ABNORMAL HIGH (ref 70–99)
Glucose-Capillary: 189 mg/dL — ABNORMAL HIGH (ref 70–99)

## 2023-09-11 LAB — BASIC METABOLIC PANEL WITH GFR
Anion gap: 14 (ref 5–15)
BUN: 41 mg/dL — ABNORMAL HIGH (ref 8–23)
CO2: 26 mmol/L (ref 22–32)
Calcium: 7.1 mg/dL — ABNORMAL LOW (ref 8.9–10.3)
Chloride: 94 mmol/L — ABNORMAL LOW (ref 98–111)
Creatinine, Ser: 2.7 mg/dL — ABNORMAL HIGH (ref 0.44–1.00)
GFR, Estimated: 19 mL/min — ABNORMAL LOW (ref >60)
Glucose, Bld: 126 mg/dL — ABNORMAL HIGH (ref 70–99)
Potassium: 4 mmol/L (ref 3.5–5.1)
Sodium: 134 mmol/L — ABNORMAL LOW (ref 135–145)

## 2023-09-11 MED ORDER — FUROSEMIDE 10 MG/ML IJ SOLN
60.0000 mg | Freq: Once | INTRAMUSCULAR | Status: AC
  Administered 2023-09-11: 60 mg via INTRAVENOUS
  Filled 2023-09-11: qty 6

## 2023-09-11 MED ORDER — GUAIFENESIN ER 600 MG PO TB12
600.0000 mg | ORAL_TABLET | Freq: Two times a day (BID) | ORAL | Status: AC | PRN
  Administered 2023-09-11 – 2023-09-12 (×2): 600 mg via ORAL
  Filled 2023-09-11 (×3): qty 1

## 2023-09-11 MED ORDER — SODIUM CHLORIDE 0.9 % IV SOLN
500.0000 mg | INTRAVENOUS | Status: AC
  Administered 2023-09-11: 500 mg via INTRAVENOUS
  Filled 2023-09-11: qty 25

## 2023-09-11 MED ORDER — INSULIN ASPART 100 UNIT/ML IJ SOLN
0.0000 [IU] | Freq: Three times a day (TID) | INTRAMUSCULAR | Status: DC
  Administered 2023-09-11: 2 [IU] via SUBCUTANEOUS
  Administered 2023-09-11: 3 [IU] via SUBCUTANEOUS
  Administered 2023-09-12: 2 [IU] via SUBCUTANEOUS
  Administered 2023-09-12 – 2023-09-13 (×3): 3 [IU] via SUBCUTANEOUS
  Administered 2023-09-13: 2 [IU] via SUBCUTANEOUS
  Administered 2023-09-13: 3 [IU] via SUBCUTANEOUS
  Administered 2023-09-14 (×3): 2 [IU] via SUBCUTANEOUS
  Administered 2023-09-15 (×2): 3 [IU] via SUBCUTANEOUS
  Administered 2023-09-16: 5 [IU] via SUBCUTANEOUS

## 2023-09-11 NOTE — TOC Progression Note (Signed)
 Transition of Care [facility]) - Progression Note    Patient Details  Name: [name] MRN: [id number] Date of Birth: Oct 12, 1955  [facility] CM/SW Contact  Arron Big, [place] Phone Number: 09/11/2023, 10:04 AM  Clinical Narrative:   Per daily meeting with treatment team, patient may potentially be medically stable Monday 4/28 for DC. CSW notified facility and they stated "okay".   TOC will continue to follow.    Expected Discharge Plan: Skilled Nursing Facility Barriers to Discharge: Continued Medical Work up  Expected Discharge Plan and Services In-house Referral: Clinical Social Work   Post Acute Care Choice: Skilled Nursing Facility Living arrangements for the past 2 months: Skilled Nursing Facility                                       Social Determinants of Health (SDOH) Interventions SDOH Screenings   Food Insecurity: No Food Insecurity (09/08/2023)  Housing: Unknown (09/08/2023)  Transportation Needs: No Transportation Needs (09/08/2023)  Utilities: Not At Risk (09/06/2023)  Social Connections: Unknown (09/06/2023)  Tobacco Use: Medium Risk (09/07/2023)    Readmission Risk Interventions     No data to display

## 2023-09-11 NOTE — Progress Notes (Signed)
 PROGRESS NOTE    [name]  ZOX:[id number] DOB: 26-Jan-1956 DOA: 09/06/2023 PCP: Marquetta Sit, MD    Brief Narrative:  Patient is 68 year old female with history of chronic diastolic heart failure, type 2 diabetes on insulin , diabetic polyneuropathy, bilateral BKA and bed bound status  long-term nursing home resident, history of hypertension, hyperlipidemia, chronic iron -deficiency anemia presented to the hospital with shortness of breath from nursing home and found to have non-STEMI.  Routine vital signs at the nursing home revealed low oxygen saturations at 80% on room air.  This prompted her to send to ER.  Patient without particular symptoms but with vague symptoms. In the emergency room on 2 L oxygen.  Troponins [id number].  Started on heparin  infusion, admitted to hospital with cardiology consultation. 4/21, cardiac catheterization with extensive coronary artery disease not amenable to PCI and she is not a candidate for CABG. Also diuresed and followed by heart failure team.  Subjective:  Patient seen and examined.  No overnight events.  Patient tells me she feels fine.  Denies any chest pain or shortness of breath.  She feels like she is back to baseline. Creatinine 2.7 Urine output 1.6 L last 24 hours.  Assessment & Plan:   Non-STEMI: Presented with shortness of breath. Cardiac catheterization with severe multivessel CAD. Remains on DAPT and high intensity statin.  Completed 48 hours of heparin  infusion. Patient is not a candidate for PCI.  She is not a good candidate for CABG.  Medical management advised. Remains on hydralazine  and nitrates.  Acute on chronic systolic and diastolic congestive heart failure: She has history of diastolic heart failure.  Repeat echocardiogram consistent with ejection fraction 20 to 25% and grade 2 diastolic dysfunction.  Patient was treated with IV diuresis and metolazone .  Started on spironolactone .  Creatinine up today.  Diuretics on  hold. Followed by heart failure team.  Type 2 diabetes: Well-controlled.  A1c 7.3.  On Lantus  that she takes at nursing home.  AKI on CKD stage IIIa: Recent baseline creatinine of 1.23.  Creatinine gradually going up 1.76-2.25-2.5 2.7..  Diuretics on hold today.  Recheck tomorrow morning.  Essential hypertension, stable Hyperlipidemia, stable Chronic iron  deficiency anemia, stable Bilateral amputee, long-term nursing home resident  Pressure Injury 09/07/23 Sacrum Right;Left;Mid Stage 1 -  Intact skin with non-blanchable redness of a localized area usually over a bony prominence. (Active)  09/07/23 0020  Location: Sacrum  Location Orientation: Right;Left;Mid  Staging: Stage 1 -  Intact skin with non-blanchable redness of a localized area usually over a bony prominence.  Wound Description (Comments):   Present on Admission: Yes        DVT prophylaxis: SCDs Start: 09/06/23 2052   Code Status: Full code Family Communication: None at the bedside Disposition Plan: Status is: Inpatient Remains inpatient appropriate because: IV diuresis, monitoring renal functions.  Needs to go back to long-term care.     Consultants:  Cardiology  Procedures:  Cardiac cath  Antimicrobials:  None     Objective: Vitals:   09/11/23 0426 09/11/23 0427 09/11/23 0432 09/11/23 0755  BP:    137/61  Pulse: 78 78 78 78  Resp:  18 (!) 23 16  Temp:    97.7 F (36.5 C)  TempSrc:      SpO2: 100% 100% 100% 100%  Weight:   113.7 kg   Height:        Intake/Output Summary (Last 24 hours) at 09/11/2023 1126 Last data filed at 09/11/2023 0755 Gross per  24 hour  Intake 962.71 ml  Output 1600 ml  Net -637.29 ml   Filed Weights   09/09/23 0500 09/10/23 0424 09/11/23 0432  Weight: 107.3 kg 107.8 kg 113.7 kg    Examination:  General: Chronically sick looking.  Frail and debilitated.  Not in any distress.  On minimal oxygen.  Pleasant and interactive. Cardiovascular: S1-S2 normal.  Regular rate  rhythm. Respiratory: Bilateral clear.  No added sounds. SpO2: 100 % O2 Flow Rate (L/min): 3 L/min FiO2 (%): 60 %  Gastrointestinal: Soft.  Obese and pendulous.  Nontender.  Bowel sound present. Foley catheter with clear urine. Ext: No swelling or edema.  Bilateral BKA stump clean and dry. Neuro: Intact.     Data Reviewed: I have personally reviewed following labs and imaging studies  CBC: Recent Labs  Lab 09/06/23 1442 09/07/23 0212 09/07/23 1815 09/07/23 1823 09/07/23 1844 09/08/23 0325 09/09/23 0438 09/10/23 0415  WBC 17.8* 15.2*  --   --   --  12.2* 13.3* 12.2*  NEUTROABS 15.7*  --   --   --   --   --   --   --   HGB 11.6* 11.2*   < > 11.9* 10.9* 10.0* 9.8* 9.5*  HCT 39.9 37.8   < > 35.0* 32.0* 33.0* 31.8* 30.6*  MCV 87.9 85.3  --   --   --  83.8 82.8 82.0  PLT 271 224  --   --   --  245 226 229   < > = values in this interval not displayed.   Basic Metabolic Panel: Recent Labs  Lab 09/07/23 0212 09/07/23 1815 09/08/23 0325 09/08/23 1740 09/09/23 0438 09/10/23 0415 09/11/23 0317  NA 140   < > 139 137 137 133* 134*  K 4.7   < > 4.3 4.0 3.8 3.6 4.0  CL 106  --  104 99 97* 94* 94*  CO2 22  --  24 26 26 27 26   GLUCOSE 221*  --  168* 145* 106* 92 126*  BUN 24*  --  30* 31* 35* 40* 41*  CREATININE 1.50*  --  1.76* 2.14* 2.25* 2.52* 2.70*  CALCIUM  8.0*  --  7.6* 7.7* 7.6* 7.4* 7.1*  MG 1.9  --   --   --  2.2  --   --    < > = values in this interval not displayed.   GFR: Estimated Creatinine Clearance: 25.5 mL/min (A) (by C-G formula based on SCr of 2.7 mg/dL (H)). Liver Function Tests: Recent Labs  Lab 09/06/23 1442 09/07/23 0212  AST 32 112*  ALT 18 37  ALKPHOS 106 129*  BILITOT 1.3* 0.7  PROT 5.7* 6.6  ALBUMIN  2.0* 2.6*   No results for input(s): "LIPASE", "AMYLASE" in the last 168 hours. No results for input(s): "AMMONIA" in the last 168 hours. Coagulation Profile: No results for input(s): "INR", "PROTIME" in the last 168 hours. Cardiac  Enzymes: No results for input(s): "CKTOTAL", "CKMB", "CKMBINDEX", "TROPONINI" in the last 168 hours. BNP (last 3 results) No results for input(s): "PROBNP" in the last 8760 hours. HbA1C: No results for input(s): "HGBA1C" in the last 72 hours.  CBG: Recent Labs  Lab 09/10/23 1113 09/10/23 1535 09/10/23 2022 09/11/23 0004 09/11/23 0424  GLUCAP 165* 152* 146* 147* 129*   Lipid Profile: No results for input(s): "CHOL", "HDL", "LDLCALC", "TRIG", "CHOLHDL", "LDLDIRECT" in the last 72 hours.  Thyroid  Function Tests: No results for input(s): "TSH", "T4TOTAL", "FREET4", "T3FREE", "THYROIDAB" in the last 72 hours.  Anemia Panel: Recent Labs    09/09/23 1101  VITAMINB12 578  FOLATE 12.8  FERRITIN 275  TIBC 209*  IRON  24*  RETICCTPCT 1.3   Sepsis Labs: Recent Labs  Lab 09/07/23 0211  PROCALCITON 0.10    Recent Results (from the past 240 hours)  Resp panel by RT-PCR (RSV, Flu A&B, Covid) Anterior Nasal Swab     Status: None   Collection Time: 09/06/23  2:05 PM   Specimen: Anterior Nasal Swab  Result Value Ref Range Status   SARS Coronavirus 2 by RT PCR NEGATIVE NEGATIVE Final   Influenza A by PCR NEGATIVE NEGATIVE Final   Influenza B by PCR NEGATIVE NEGATIVE Final    Comment: (NOTE) The Xpert Xpress SARS-CoV-2/FLU/RSV plus assay is intended as an aid in the diagnosis of influenza from Nasopharyngeal swab specimens and should not be used as a sole basis for treatment. Nasal washings and aspirates are unacceptable for Xpert Xpress SARS-CoV-2/FLU/RSV testing.  Fact Sheet for Patients: BloggerCourse.com  Fact Sheet for Healthcare Providers: SeriousBroker.it  This test is not yet approved or cleared by the [place]  FDA and has been authorized for detection and/or diagnosis of SARS-CoV-2 by FDA under an Emergency Use Authorization (EUA). This EUA will remain in effect (meaning this test can be used) for the duration of  the COVID-19 declaration under Section 564(b)(1) of the Act, 21 U.S.C. section 360bbb-3(b)(1), unless the authorization is terminated or revoked.     Resp Syncytial Virus by PCR NEGATIVE NEGATIVE Final    Comment: (NOTE) Fact Sheet for Patients: BloggerCourse.com  Fact Sheet for Healthcare Providers: SeriousBroker.it  This test is not yet approved or cleared by the [place]  FDA and has been authorized for detection and/or diagnosis of SARS-CoV-2 by FDA under an Emergency Use Authorization (EUA). This EUA will remain in effect (meaning this test can be used) for the duration of the COVID-19 declaration under Section 564(b)(1) of the Act, 21 U.S.C. section 360bbb-3(b)(1), unless the authorization is terminated or revoked.  Performed at [facility], [address] [place], [place] [address]   MRSA Next Gen by PCR, Nasal     Status: None   Collection Time: 09/07/23  1:10 AM   Specimen: Nasal Mucosa; Nasal Swab  Result Value Ref Range Status   MRSA by PCR Next Gen NOT DETECTED NOT DETECTED Final    Comment: (NOTE) The GeneXpert MRSA Assay (FDA approved for NASAL specimens only), is one component of a comprehensive MRSA colonization surveillance program. It is not intended to diagnose MRSA infection nor to guide or monitor treatment for MRSA infections. Test performance is not FDA approved in patients less than 42 years old. Performed at [facility], [address], [place], [place] [address]   MRSA Next Gen by PCR, Nasal     Status: None   Collection Time: 09/07/23  7:32 PM   Specimen: Nasal Mucosa; Nasal Swab  Result Value Ref Range Status   MRSA by PCR Next Gen NOT DETECTED NOT DETECTED Final    Comment: (NOTE) The GeneXpert MRSA Assay (FDA approved for NASAL specimens only), is one component of a comprehensive MRSA colonization surveillance program. It is not intended to diagnose MRSA infection nor to  guide or monitor treatment for MRSA infections. Test performance is not FDA approved in patients less than 49 years old. Performed at [facility], [address] [place], [place] [address]          Radiology Studies: DG  CHEST PORT 1 VIEW Result Date: 09/10/2023 CLINICAL DATA:  Acute systolic CHF EXAM: PORTABLE CHEST 1 VIEW COMPARISON:  September 06, 2023 FINDINGS: Bilateral reticular interstitial infiltrates right worse than left correlate with congestive changes without consolidations or significant pleural effusions the heart is slightly enlarged. Biventricular configuration stable since prior examination Right PICC line tip in the SVC IMPRESSION: Bilateral reticular interstitial infiltrates right worse than left correlate with congestive changes without consolidations or significant pleural effusions. Electronically Signed   By: Fredrich Jefferson M.D.   On: 09/10/2023 14:21         Scheduled Meds:  aspirin   81 mg Oral Daily   atorvastatin   80 mg Oral Daily   Chlorhexidine  Gluconate Cloth  6 each Topical Daily   clopidogrel   75 mg Oral Daily   ferrous sulfate   325 mg Oral BID WC   hydrALAZINE   25 mg Oral Q8H   insulin  aspart  0-15 Units Subcutaneous TID WC   insulin  glargine-yfgn  15 Units Subcutaneous BID   ipratropium-albuterol   3 mL Nebulization Once   isosorbide  dinitrate  10 mg Oral BID   metoprolol  succinate  25 mg Oral Daily   polyethylene glycol  17 g Oral Daily   senna-docusate  1 tablet Oral BID   sodium chloride  flush  10-40 mL Intracatheter Q12H   sodium chloride  flush  3 mL Intravenous Q12H   Continuous Infusions:  iron  sucrose       LOS: 5 days      Kinslie Hove, MD [facility]

## 2023-09-11 NOTE — Progress Notes (Signed)
 C/O feeling like she could not breath, oxygen saturation 100% on 4lpm [place], lungs scattered wheeze with some congestion. Rt evaluated and Prn neb given. MD notified

## 2023-09-11 NOTE — Plan of Care (Signed)
   Problem: Coping: Goal: Ability to adjust to condition or change in health will improve Outcome: Progressing   Problem: Metabolic: Goal: Ability to maintain appropriate glucose levels will improve Outcome: Progressing   Problem: Nutritional: Goal: Maintenance of adequate nutrition will improve Outcome: Progressing   Problem: Skin Integrity: Goal: Risk for impaired skin integrity will decrease Outcome: Progressing   Problem: Tissue Perfusion: Goal: Adequacy of tissue perfusion will improve Outcome: Progressing

## 2023-09-11 NOTE — Plan of Care (Signed)

## 2023-09-11 NOTE — Progress Notes (Signed)
 Progress Note  Patient Name: [name] Date of Encounter: 09/11/2023  Primary Cardiologist: None   Subjective   She is without complaint today, and feels roughly the same since she was admitted. Denies pain, dyspnea, fevers, chills. Continues to wear oxygen. Slept well. Is eating breakfast.  Inpatient Medications    Scheduled Meds:  aspirin   81 mg Oral Daily   atorvastatin   80 mg Oral Daily   Chlorhexidine  Gluconate Cloth  6 each Topical Daily   clopidogrel   75 mg Oral Daily   ferrous sulfate   325 mg Oral BID WC   hydrALAZINE   25 mg Oral Q8H   insulin  aspart  0-15 Units Subcutaneous TID WC   insulin  glargine-yfgn  15 Units Subcutaneous BID   ipratropium-albuterol   3 mL Nebulization Once   isosorbide  dinitrate  10 mg Oral BID   metoprolol  succinate  25 mg Oral Daily   polyethylene glycol  17 g Oral Daily   senna-docusate  1 tablet Oral BID   sodium chloride  flush  10-40 mL Intracatheter Q12H   sodium chloride  flush  3 mL Intravenous Q12H   Continuous Infusions:  iron  sucrose     PRN Meds: acetaminophen  **OR** acetaminophen , ipratropium-albuterol , melatonin, ondansetron  (ZOFRAN ) IV, mouth rinse, sodium chloride  flush, sodium chloride  flush   Vital Signs    Vitals:   09/11/23 0426 09/11/23 0427 09/11/23 0432 09/11/23 0755  BP:    137/61  Pulse: 78 78 78 78  Resp:  18 (!) 23 16  Temp:    97.7 F (36.5 C)  TempSrc:      SpO2: 100% 100% 100% 100%  Weight:   113.7 kg   Height:        Intake/Output Summary (Last 24 hours) at 09/11/2023 0937 Last data filed at 09/11/2023 0755 Gross per 24 hour  Intake 962.71 ml  Output 1700 ml  Net -737.29 ml   Filed Weights   09/09/23 0500 09/10/23 0424 09/11/23 0432  Weight: 107.3 kg 107.8 kg 113.7 kg    Telemetry    SNR with some PVCs - Personally Reviewed  Physical Exam   GEN: No acute distress. Wearing oxgyen 3Lnc. Neck: No JVD Cardiac: RRR, no murmurs, rubs, or gallops.  Respiratory: Scattered wheezes  bilaterally. GI: Soft, nontender, non-distended  MS: Bilateral BKAs. Neuro:  Nonfocal  Psych: Normal affect   Labs    Chemistry Recent Labs  Lab 09/06/23 1442 09/07/23 0212 09/07/23 1815 09/09/23 0438 09/10/23 0415 09/11/23 0317  NA 139 140   < > 137 133* 134*  K 4.7 4.7   < > 3.8 3.6 4.0  CL 110 106   < > 97* 94* 94*  CO2 19* 22   < > 26 27 26   GLUCOSE 278* 221*   < > 106* 92 126*  BUN 18 24*   < > 35* 40* 41*  CREATININE 1.23* 1.50*   < > 2.25* 2.52* 2.70*  CALCIUM  6.8* 8.0*   < > 7.6* 7.4* 7.1*  PROT 5.7* 6.6  --   --   --   --   ALBUMIN  2.0* 2.6*  --   --   --   --   AST 32 112*  --   --   --   --   ALT 18 37  --   --   --   --   ALKPHOS 106 129*  --   --   --   --   BILITOT 1.3* 0.7  --   --   --   --  GFRNONAA 48* 38*   < > 23* 20* 19*  ANIONGAP 10 12   < > 14 12 14    < > = values in this interval not displayed.     Hematology Recent Labs  Lab 09/08/23 0325 09/09/23 0438 09/09/23 1101 09/10/23 0415  WBC 12.2* 13.3*  --  12.2*  RBC 3.94 3.84* 3.94 3.73*  HGB 10.0* 9.8*  --  9.5*  HCT 33.0* 31.8*  --  30.6*  MCV 83.8 82.8  --  82.0  MCH 25.4* 25.5*  --  25.5*  MCHC 30.3 30.8  --  31.0  RDW 19.0* 19.1*  --  18.4*  PLT 245 226  --  229    Cardiac EnzymesNo results for input(s): "TROPONINI" in the last 168 hours. No results for input(s): "TROPIPOC" in the last 168 hours.   BNP Recent Labs  Lab 09/06/23 1404 09/07/23 0856  BNP 359.9* 1,077.5*     DDimer No results for input(s): "DDIMER" in the last 168 hours.   Radiology    DG CHEST PORT 1 VIEW Result Date: 09/10/2023 CLINICAL DATA:  Acute systolic CHF EXAM: PORTABLE CHEST 1 VIEW COMPARISON:  September 06, 2023 FINDINGS: Bilateral reticular interstitial infiltrates right worse than left correlate with congestive changes without consolidations or significant pleural effusions the heart is slightly enlarged. Biventricular configuration stable since prior examination Right PICC line tip in the SVC  IMPRESSION: Bilateral reticular interstitial infiltrates right worse than left correlate with congestive changes without consolidations or significant pleural effusions. Electronically Signed   By: Fredrich Jefferson M.D.   On: 09/10/2023 14:21    Cardiac Studies   ECHO 4/21 with EF 20-25%, global hypokinesis of LV, concentric LV hypertrophy, mild reduced RV systolic function, moderate tricuspid regurgitation.  Patient Profile     68 y.o. female with history of nonobstructive CAD, HF with recovered EF, insulin  dependent DM II, bilateral AKA, CVA, CKD IIIa. Admitted with NSTEMI and acute respiratory failure 2/2 CHF.  Assessment & Plan    NSTEMI Troponin > 24k earlier this week. LHC with mulitivessal CAD but not a CABG candidate and poorly amenable to PCI. Plan for medical management. She is without CP. - DAPT aspirin  and plavix  and high intensity atorvastatin  80 every day  HFrEF with exacerbation In past recovered EF but Echo 4/21 demonstarted EF 20-25%. Diuretics held much of this week due to renal function. Volume exam challenging given habitus. Remains on 3L oxygen but improvement in lung exam. - Continue to hold diuretics per Cr and reevaluate in AM, possible discharge soon with home diuretics with improved Cr - Toprol  XL 25 daily  - Resume aldactone  at later date for GDMT. Can consider SGLT2i but limited baseline functionality might make her a poor candidate. - hydralazine /isordil  for afterload reduction  - wean O2 as tolerated  PVCs On metoprolol , burden improved  Will continue to follow   For questions or updates, please contact [facility] Please consult [email or web address] for contact info under Cardiology/STEMI.    Kae Oram, DO  09/11/2023, 9:37 AM

## 2023-09-12 DIAGNOSIS — Z7189 Other specified counseling: Secondary | ICD-10-CM

## 2023-09-12 DIAGNOSIS — Z515 Encounter for palliative care: Secondary | ICD-10-CM | POA: Diagnosis not present

## 2023-09-12 DIAGNOSIS — I214 Non-ST elevation (NSTEMI) myocardial infarction: Secondary | ICD-10-CM | POA: Diagnosis not present

## 2023-09-12 LAB — GLUCOSE, CAPILLARY
Glucose-Capillary: 140 mg/dL — ABNORMAL HIGH (ref 70–99)
Glucose-Capillary: 198 mg/dL — ABNORMAL HIGH (ref 70–99)
Glucose-Capillary: 173 mg/dL — ABNORMAL HIGH (ref 70–99)
Glucose-Capillary: 143 mg/dL — ABNORMAL HIGH (ref 70–99)

## 2023-09-12 LAB — BASIC METABOLIC PANEL WITH GFR
Anion gap: 12 (ref 5–15)
BUN: 40 mg/dL — ABNORMAL HIGH (ref 8–23)
CO2: 29 mmol/L (ref 22–32)
Calcium: 7.1 mg/dL — ABNORMAL LOW (ref 8.9–10.3)
Chloride: 95 mmol/L — ABNORMAL LOW (ref 98–111)
Creatinine, Ser: 2.62 mg/dL — ABNORMAL HIGH (ref 0.44–1.00)
GFR, Estimated: 19 mL/min — ABNORMAL LOW (ref >60)
Glucose, Bld: 141 mg/dL — ABNORMAL HIGH (ref 70–99)
Potassium: 3.7 mmol/L (ref 3.5–5.1)
Sodium: 136 mmol/L (ref 135–145)

## 2023-09-12 NOTE — Progress Notes (Signed)
   Patient Name: [name] Date of Encounter: 09/12/2023 [facility] Cardiologist: None   Interval Summary  .    No acute complaints  Vital Signs .    Vitals:   09/11/23 2308 09/12/23 0515 09/12/23 0606 09/12/23 0916  BP: (!) 123/59 138/64  125/66  Pulse: 78 79 79   Resp: 19 (!) 27 19   Temp: 98 F (36.7 C) 98 F (36.7 C)  97.9 F (36.6 C)  TempSrc: Oral Oral  Oral  SpO2: 100% 100% 100%   Weight:  110.1 kg    Height:        Intake/Output Summary (Last 24 hours) at 09/12/2023 1052 Last data filed at 09/12/2023 0519 Gross per 24 hour  Intake 666.25 ml  Output 3350 ml  Net -2683.75 ml      09/12/2023    5:15 AM 09/11/2023    4:32 AM 09/10/2023    4:24 AM  Last 3 Weights  Weight (lbs) 242 lb 11.6 oz 250 lb 10.6 oz 237 lb 10.5 oz  Weight (kg) 110.1 kg 113.7 kg 107.8 kg      Telemetry/ECG    Sinus rhythm- Personally Reviewed  Physical Exam .   GEN: No acute distress.   Neck: No JVD Cardiac: RRR, no murmurs, rubs, or gallops.  Respiratory: Clear to auscultation bilaterally. GI: Soft, nontender, non-distended  MS: Bilateral AKA  Assessment & Plan .     1.  Non-STEMI: Troponin elevation to greater than 24,000.  Left heart catheterization with severe coronary artery disease.  Thought not to be a candidate for surgery and poor targets for PCI.  [name] continue with medical therapy.  2.  Acute on chronic systolic heart failure: Ejection fraction 20 to 25%.  Net -7.5 L.  Creatinine elevated.  [name] hold diuretics today.  May be able to restart tomorrow.  Volume status difficult due to body habitus.  3.  PVCs: On metoprolol  and improved.  For questions or updates, please contact [facility] Please consult [email or web address] for contact info under        Signed, Jayleon Mcfarlane Cortland Ding, MD

## 2023-09-12 NOTE — Progress Notes (Signed)
 PROGRESS NOTE    [name]  ZOX:[id number] DOB: 1956/02/15 DOA: 09/06/2023 PCP: Marquetta Sit, MD    Brief Narrative:  Patient is 68 year old female with history of chronic diastolic heart failure, type 2 diabetes on insulin , diabetic polyneuropathy, bilateral BKA and bed bound status  long-term nursing home resident, history of hypertension, hyperlipidemia, chronic iron -deficiency anemia presented to the hospital with shortness of breath from nursing home and found to have non-STEMI.  Routine vital signs at the nursing home revealed low oxygen saturations at 80% on room air.  This prompted her to send to ER.  Patient without particular symptoms but with vague symptoms. In the emergency room on 2 L oxygen.  Troponins [id number].  Started on heparin  infusion, admitted to hospital with cardiology consultation. 4/21, cardiac catheterization with extensive coronary artery disease not amenable to PCI and she is not a candidate for CABG. Also diuresed and followed by heart failure team.  Subjective:  Patient seen and examined.  No overnight events. Patient had complained of shortness of breath and congestion yesterday evening, chest x-ray showed persistent interstitial fluid more on the right side.  Given 60 mg of IV Lasix  with 3.5 L urine output and improvement of symptoms. Creatinine 2.62. Negative balance 7 L. Discussed with patient about palliative care discussions, healthcare power of attorney designations.  She is agreed to talk to palliative care.  Assessment & Plan:   Non-STEMI: Presented with shortness of breath. Cardiac catheterization with severe multivessel CAD. Remains on DAPT and high intensity statin.  Completed 48 hours of heparin  infusion. Patient is not a candidate for PCI.  She is not a good candidate for CABG.  Medical management advised. Remains on hydralazine  and nitrates.  Acute on chronic systolic and diastolic congestive heart failure: She has history of  diastolic heart failure.  Repeat echocardiogram consistent with ejection fraction 20 to 25% and grade 2 diastolic dysfunction.  Patient was treated with IV diuresis and metolazone .  Started on spironolactone .  Creatinine fluctuates but now is stabilizing..  Diuretics on hold. Followed by heart failure team. 1 dose of Lasix  60 mg/25.  Holding further diuretics.  Would use as needed. Poor prognosis, EF 20% and multivessel coronary artery disease.  Will need palliative care discussions.  Consulted.  Type 2 diabetes: Well-controlled.  A1c 7.3.  On Lantus  that she takes at nursing home.  AKI on CKD stage IIIa: Recent baseline creatinine of 1.23.  Creatinine gradually going up 1.76-2.25-2.5 2.7-2.6 .  Diuretics on hold today.  Recheck tomorrow morning.  Essential hypertension, stable Hyperlipidemia, stable Chronic iron  deficiency anemia, stable Bilateral amputee, long-term nursing home resident  Pressure Injury 09/07/23 Sacrum Right;Left;Mid Stage 1 -  Intact skin with non-blanchable redness of a localized area usually over a bony prominence. (Active)  09/07/23 0020  Location: Sacrum  Location Orientation: Right;Left;Mid  Staging: Stage 1 -  Intact skin with non-blanchable redness of a localized area usually over a bony prominence.  Wound Description (Comments):   Present on Admission: Yes        DVT prophylaxis: SCDs Start: 09/06/23 2052   Code Status: Full code Family Communication: None at the bedside.  Multiple family members including sisters and aunt at the bedside 4/25. Disposition Plan: Status is: Inpatient Remains inpatient appropriate because: IV diuresis, monitoring renal functions.  Needs to go back to long-term care.     Consultants:  Cardiology Palliative care  Procedures:  Cardiac cath  Antimicrobials:  None     Objective: Vitals:  09/11/23 2308 09/12/23 0515 09/12/23 0606 09/12/23 0916  BP: (!) 123/59 138/64  125/66  Pulse: 78 79 79   Resp: 19 (!) 27  19   Temp: 98 F (36.7 C) 98 F (36.7 C)  97.9 F (36.6 C)  TempSrc: Oral Oral  Oral  SpO2: 100% 100% 100%   Weight:  110.1 kg    Height:        Intake/Output Summary (Last 24 hours) at 09/12/2023 1113 Last data filed at 09/12/2023 0519 Gross per 24 hour  Intake 666.25 ml  Output 3350 ml  Net -2683.75 ml   Filed Weights   09/10/23 0424 09/11/23 0432 09/12/23 0515  Weight: 107.8 kg 113.7 kg 110.1 kg    Examination:  General: Frail and debilitated.  Chronically sick looking.  Fairly stable today.  Interactive and pleasant. Cardiovascular: S1-S2 normal.  Regular rate rhythm. Respiratory: Difficult to assess however she does have some conducted airway sounds and expiratory wheezes. SpO2: 100 % O2 Flow Rate (L/min): 3 L/min FiO2 (%): 60 %  Gastrointestinal: Soft.  Obese and pendulous.  Nontender.  Bowel sound present. Foley catheter with clear urine. Ext: No swelling or edema.  Bilateral BKA stump clean and dry. Neuro: Intact.     Data Reviewed: I have personally reviewed following labs and imaging studies  CBC: Recent Labs  Lab 09/06/23 1442 09/07/23 0212 09/07/23 1815 09/07/23 1823 09/07/23 1844 09/08/23 0325 09/09/23 0438 09/10/23 0415  WBC 17.8* 15.2*  --   --   --  12.2* 13.3* 12.2*  NEUTROABS 15.7*  --   --   --   --   --   --   --   HGB 11.6* 11.2*   < > 11.9* 10.9* 10.0* 9.8* 9.5*  HCT 39.9 37.8   < > 35.0* 32.0* 33.0* 31.8* 30.6*  MCV 87.9 85.3  --   --   --  83.8 82.8 82.0  PLT 271 224  --   --   --  245 226 229   < > = values in this interval not displayed.   Basic Metabolic Panel: Recent Labs  Lab 09/07/23 0212 09/07/23 1815 09/08/23 1740 09/09/23 0438 09/10/23 0415 09/11/23 0317 09/12/23 0359  NA 140   < > 137 137 133* 134* 136  K 4.7   < > 4.0 3.8 3.6 4.0 3.7  CL 106   < > 99 97* 94* 94* 95*  CO2 22   < > 26 26 27 26 29   GLUCOSE 221*   < > 145* 106* 92 126* 141*  BUN 24*   < > 31* 35* 40* 41* 40*  CREATININE 1.50*   < > 2.14* 2.25*  2.52* 2.70* 2.62*  CALCIUM  8.0*   < > 7.7* 7.6* 7.4* 7.1* 7.1*  MG 1.9  --   --  2.2  --   --   --    < > = values in this interval not displayed.   GFR: Estimated Creatinine Clearance: 25.8 mL/min (A) (by C-G formula based on SCr of 2.62 mg/dL (H)). Liver Function Tests: Recent Labs  Lab 09/06/23 1442 09/07/23 0212  AST 32 112*  ALT 18 37  ALKPHOS 106 129*  BILITOT 1.3* 0.7  PROT 5.7* 6.6  ALBUMIN  2.0* 2.6*   No results for input(s): "LIPASE", "AMYLASE" in the last 168 hours. No results for input(s): "AMMONIA" in the last 168 hours. Coagulation Profile: No results for input(s): "INR", "PROTIME" in the last 168 hours. Cardiac Enzymes: No results  for input(s): "CKTOTAL", "CKMB", "CKMBINDEX", "TROPONINI" in the last 168 hours. BNP (last 3 results) No results for input(s): "PROBNP" in the last 8760 hours. HbA1C: No results for input(s): "HGBA1C" in the last 72 hours.  CBG: Recent Labs  Lab 09/11/23 0424 09/11/23 1134 09/11/23 1554 09/11/23 2036 09/12/23 0632  GLUCAP 129* 189* 139* 164* 140*   Lipid Profile: No results for input(s): "CHOL", "HDL", "LDLCALC", "TRIG", "CHOLHDL", "LDLDIRECT" in the last 72 hours.  Thyroid  Function Tests: No results for input(s): "TSH", "T4TOTAL", "FREET4", "T3FREE", "THYROIDAB" in the last 72 hours. Anemia Panel: No results for input(s): "VITAMINB12", "FOLATE", "FERRITIN", "TIBC", "IRON ", "RETICCTPCT" in the last 72 hours.  Sepsis Labs: Recent Labs  Lab 09/07/23 0211  PROCALCITON 0.10    Recent Results (from the past 240 hours)  Resp panel by RT-PCR (RSV, Flu A&B, Covid) Anterior Nasal Swab     Status: None   Collection Time: 09/06/23  2:05 PM   Specimen: Anterior Nasal Swab  Result Value Ref Range Status   SARS Coronavirus 2 by RT PCR NEGATIVE NEGATIVE Final   Influenza A by PCR NEGATIVE NEGATIVE Final   Influenza B by PCR NEGATIVE NEGATIVE Final    Comment: (NOTE) The Xpert Xpress SARS-CoV-2/FLU/RSV plus assay is intended as  an aid in the diagnosis of influenza from Nasopharyngeal swab specimens and should not be used as a sole basis for treatment. Nasal washings and aspirates are unacceptable for Xpert Xpress SARS-CoV-2/FLU/RSV testing.  Fact Sheet for Patients: BloggerCourse.com  Fact Sheet for Healthcare Providers: SeriousBroker.it  This test is not yet approved or cleared by the [place]  FDA and has been authorized for detection and/or diagnosis of SARS-CoV-2 by FDA under an Emergency Use Authorization (EUA). This EUA will remain in effect (meaning this test can be used) for the duration of the COVID-19 declaration under Section 564(b)(1) of the Act, 21 U.S.C. section 360bbb-3(b)(1), unless the authorization is terminated or revoked.     Resp Syncytial Virus by PCR NEGATIVE NEGATIVE Final    Comment: (NOTE) Fact Sheet for Patients: BloggerCourse.com  Fact Sheet for Healthcare Providers: SeriousBroker.it  This test is not yet approved or cleared by the [place]  FDA and has been authorized for detection and/or diagnosis of SARS-CoV-2 by FDA under an Emergency Use Authorization (EUA). This EUA will remain in effect (meaning this test can be used) for the duration of the COVID-19 declaration under Section 564(b)(1) of the Act, 21 U.S.C. section 360bbb-3(b)(1), unless the authorization is terminated or revoked.  Performed at [facility], [address] [place], [place] [address]   MRSA Next Gen by PCR, Nasal     Status: None   Collection Time: 09/07/23  1:10 AM   Specimen: Nasal Mucosa; Nasal Swab  Result Value Ref Range Status   MRSA by PCR Next Gen NOT DETECTED NOT DETECTED Final    Comment: (NOTE) The GeneXpert MRSA Assay (FDA approved for NASAL specimens only), is one component of a comprehensive MRSA colonization surveillance program. It is not intended to diagnose MRSA  infection nor to guide or monitor treatment for MRSA infections. Test performance is not FDA approved in patients less than 2 years old. Performed at [facility], [address], [place], [place] [address]   MRSA Next Gen by PCR, Nasal     Status: None   Collection Time: 09/07/23  7:32 PM   Specimen: Nasal Mucosa; Nasal Swab  Result Value Ref Range Status   MRSA by PCR Next Gen  NOT DETECTED NOT DETECTED Final    Comment: (NOTE) The GeneXpert MRSA Assay (FDA approved for NASAL specimens only), is one component of a comprehensive MRSA colonization surveillance program. It is not intended to diagnose MRSA infection nor to guide or monitor treatment for MRSA infections. Test performance is not FDA approved in patients less than 31 years old. Performed at [facility], [address] [place], [place] [address]          Radiology Studies: DG CHEST PORT 1 VIEW Result Date: 09/11/2023 CLINICAL DATA:  Short of breath EXAM: PORTABLE CHEST 1 VIEW COMPARISON:  09/10/2023 FINDINGS: Single frontal view of the chest demonstrates stable enlargement of the cardiac silhouette. Right-sided PICC tip overlies superior vena cava. Veiling opacities are seen at the lung bases, right greater than left, consistent with dependent consolidation and effusions seen on prior CT. No pneumothorax. No acute bony abnormalities. IMPRESSION: 1. Stable enlarged cardiac silhouette. 2. Bibasilar veiling opacities, right greater than left, consistent with consolidation and effusions on prior CT. No significant change. Electronically Signed   By: Bobbye Burrow M.D.   On: 09/11/2023 16:54         Scheduled Meds:  aspirin   81 mg Oral Daily   atorvastatin   80 mg Oral Daily   Chlorhexidine  Gluconate Cloth  6 each Topical Daily   clopidogrel   75 mg Oral Daily   ferrous sulfate   325 mg Oral BID WC   hydrALAZINE   25 mg Oral Q8H   insulin  aspart  0-15 Units Subcutaneous TID WC   insulin  glargine-yfgn  15  Units Subcutaneous BID   ipratropium-albuterol   3 mL Nebulization Once   isosorbide  dinitrate  10 mg Oral BID   metoprolol  succinate  25 mg Oral Daily   polyethylene glycol  17 g Oral Daily   senna-docusate  1 tablet Oral BID   sodium chloride  flush  10-40 mL Intracatheter Q12H   sodium chloride  flush  3 mL Intravenous Q12H   Continuous Infusions:     LOS: 6 days      Vada Garibaldi, MD [facility]

## 2023-09-12 NOTE — Consult Note (Signed)
 Palliative Care Consult Note                                  Date: 09/12/2023   Patient Name: [name]  DOB: 1956/02/04  MRN: [id number]  Age / Sex: 68 y.o., female  PCP: Marquetta Sit, MD Referring Physician: Vada Garibaldi, MD  Reason for Consultation: Establishing goals of care  HPI/Patient Profile: 68 y.o. female  with past medical history of chronic diastolic heart failure, type 2 diabetes on insulin , diabetic polyneuropathy, bilateral BKA and bed bound status, long-term nursing home resident, history of hypertension, hyperlipidemia, chronic iron -deficiency anemia  admitted on 09/06/2023 with shortness of breath from nursing home and found to have NSTEMI .   4/21 cardiac catheterization with extensive coronary artery disease not amenable to PCI and she is not a candidate for CABG.   Past Medical History:  Diagnosis Date   Atypical chest pain    a. non obstructive by cath in 2008 and 2010;  b. 02/2012 Myoview : non-ischemic, EF 57%   Cellulitis    a. left foot-> s/p L BKA   Chronic kidney disease    Constipation    CVA (cerebral infarction)    a.  Small right parietal noted incidentally 04/2007;  b. right sided embolic CVA 05/2012;  c. TEE 2/14:  LVH, EF 55-60%, mild LAE, no LAA clot, no PFO, no R->L shunt by echo contrast, oscillating density on AV likely Lambl's Excressence    Diabetes mellitus, type II (HCC)    Diabetic retinopathy (HCC)    NPDR w/edema OU   Diaphragmatic hernia without mention of obstruction or gangrene    Dry skin    Esophageal reflux    HTN (hypertension)    Hx of BKA (HCC)    Bilateral   Hyperlipidemia    Irritable bowel syndrome    Morbid obesity (HCC)    Obesity, unspecified    Pain in shoulder 06/28/2015   Peripheral neuropathy    Stroke [facility])    2013-'no residual'   Syncope and collapse    a. near-syncopal episode in November 2008;  b. 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    Tobacco abuse    Vertigo     Subjective:   I have reviewed medical records including EPIC notes, labs and imaging, received updates from nursing, assessed the patient and then met with the patient to discuss diagnosis prognosis, GOC, EOL wishes, disposition and options.  I introduced Palliative Medicine as specialized medical care for people living with serious illness. It focuses on providing relief from symptoms and stress of a serious illness. The goal is to improve quality of life for both the patient and the family.  Patient faces treatment option decisions, advanced directive decisions, and anticipatory care needs.  Today's Discussion: Patient sitting up in bed preparing to eat lunch in NAD. She reports she is doing okay. I introduced Palliative Medicine as specialized medical care for people living with serious illness. It focuses on providing relief from symptoms and stress of a serious illness. The goal is to improve quality of life for both the patient and the family.She is familiar with palliative medicine. Prior to admission patient was a resident of [facility]. [facility] outpatient palliative supports her symptom management.  A discussion was had today regarding advanced directives. Patient shared that if she were unable to make healthcare decisions she would want her sister Wallene Gum and niece Voncille Guadalajara to  make decisions for her. We discuss that if she wants to complete ACP documents including HCPOA that we can do that while she is hospitalized. She will consider this. Patient would like for her family to be present for [facility] meeting. We plan for a family meeting at 3 pm.  Wallene Gum did not show up for meeting. Called her and left voicemail. Went back to patient's room and we discussed a plan to attempt to meet again tomorrow.  Discussed the importance of continued conversation with family and the medical providers regarding overall plan of care and treatment options, ensuring decisions are  within the context of the patient's values and GOCs.  Questions and concerns were addressed. The patient was encouraged to call with questions or concerns. PMT will continue to support holistically.  Review of Systems  Objective:   Primary Diagnoses: Present on Admission:  NSTEMI (non-ST elevated myocardial infarction) (HCC)  Leukocytosis  Chronic diastolic CHF (congestive heart failure) (HCC)  Essential hypertension  HLD (hyperlipidemia)  Chronic iron  deficiency anemia   Physical Exam Vitals reviewed.  Constitutional:      General: She is not in acute distress. HENT:     Head: Normocephalic and atraumatic.  Cardiovascular:     Rate and Rhythm: Normal rate.  Pulmonary:     Effort: Pulmonary effort is normal.  Skin:    General: Skin is warm and dry.  Neurological:     Mental Status: She is alert.     Vital Signs:  BP 125/66 (BP Location: Left Arm)   Pulse 79   Temp 97.9 F (36.6 C) (Oral)   Resp 19   Ht 5\' 6"  (1.676 m)   Wt 110.1 kg   SpO2 100%   BMI 39.18 kg/m   Palliative Assessment/Data: 30%    Advanced Care Planning:   Existing Vynca/ACP Documentation: MOST form 02/18/2019  Primary Decision Maker: PATIENT  Code Status/Advance Care Planning: Full code   Assessment & Plan:   SUMMARY OF RECOMMENDATIONS   Full code Full scope Re-attempt to meet for GOC discussion Sunday 4/27 Continued outpatient PMT support PMT to follow   Discussed with: Dr. Hilton Lucky and bedside RN  Time Total: 45 minutes    Thank you for allowing us  to participate in the care of IRVIN HIBBITT PMT will continue to support holistically.   Signed by: Joaquim Muir, NP Palliative Medicine Team  Team Phone # [phone] (Nights/Weekends)  09/12/2023, 12:53 PM

## 2023-09-12 NOTE — Plan of Care (Signed)
   Problem: Education: Goal: Ability to describe self-care measures that may prevent or decrease complications (Diabetes Survival Skills Education) will improve Outcome: Progressing

## 2023-09-13 ENCOUNTER — Inpatient Hospital Stay (HOSPITAL_COMMUNITY)

## 2023-09-13 DIAGNOSIS — I214 Non-ST elevation (NSTEMI) myocardial infarction: Secondary | ICD-10-CM | POA: Diagnosis not present

## 2023-09-13 DIAGNOSIS — I25110 Atherosclerotic heart disease of native coronary artery with unstable angina pectoris: Secondary | ICD-10-CM

## 2023-09-13 DIAGNOSIS — Z7189 Other specified counseling: Secondary | ICD-10-CM | POA: Diagnosis not present

## 2023-09-13 LAB — BASIC METABOLIC PANEL WITH GFR
Anion gap: 11 (ref 5–15)
BUN: 38 mg/dL — ABNORMAL HIGH (ref 8–23)
CO2: 28 mmol/L (ref 22–32)
Calcium: 7.3 mg/dL — ABNORMAL LOW (ref 8.9–10.3)
Chloride: 98 mmol/L (ref 98–111)
Creatinine, Ser: 2.21 mg/dL — ABNORMAL HIGH (ref 0.44–1.00)
GFR, Estimated: 24 mL/min — ABNORMAL LOW (ref >60)
Glucose, Bld: 122 mg/dL — ABNORMAL HIGH (ref 70–99)
Potassium: 3.5 mmol/L (ref 3.5–5.1)
Sodium: 137 mmol/L (ref 135–145)

## 2023-09-13 LAB — TROPONIN I (HIGH SENSITIVITY)
Troponin I (High Sensitivity): 1930 ng/L (ref <18)
Troponin I (High Sensitivity): 1799 ng/L (ref <18)

## 2023-09-13 LAB — CBC
HCT: 34.9 % — ABNORMAL LOW (ref 36.0–46.0)
Hemoglobin: 10.8 g/dL — ABNORMAL LOW (ref 12.0–15.0)
MCH: 25.2 pg — ABNORMAL LOW (ref 26.0–34.0)
MCHC: 30.9 g/dL (ref 30.0–36.0)
MCV: 81.5 fL (ref 80.0–100.0)
Platelets: 280 10*3/uL (ref 150–400)
RBC: 4.28 MIL/uL (ref 3.87–5.11)
RDW: 18.6 % — ABNORMAL HIGH (ref 11.5–15.5)
WBC: 16.8 10*3/uL — ABNORMAL HIGH (ref 4.0–10.5)
nRBC: 0.1 % (ref 0.0–0.2)

## 2023-09-13 LAB — GLUCOSE, CAPILLARY
Glucose-Capillary: 127 mg/dL — ABNORMAL HIGH (ref 70–99)
Glucose-Capillary: 152 mg/dL — ABNORMAL HIGH (ref 70–99)
Glucose-Capillary: 153 mg/dL — ABNORMAL HIGH (ref 70–99)
Glucose-Capillary: 154 mg/dL — ABNORMAL HIGH (ref 70–99)

## 2023-09-13 LAB — D-DIMER, QUANTITATIVE: D-Dimer, Quant: 2.07 ug{FEU}/mL — ABNORMAL HIGH (ref 0.00–0.50)

## 2023-09-13 LAB — HEPATIC FUNCTION PANEL
ALT: 25 U/L (ref 0–44)
AST: 25 U/L (ref 15–41)
Albumin: 2.5 g/dL — ABNORMAL LOW (ref 3.5–5.0)
Alkaline Phosphatase: 120 U/L (ref 38–126)
Bilirubin, Direct: 0.1 mg/dL (ref 0.0–0.2)
Indirect Bilirubin: 0.7 mg/dL (ref 0.3–0.9)
Total Bilirubin: 0.8 mg/dL (ref 0.0–1.2)
Total Protein: 7.2 g/dL (ref 6.5–8.1)

## 2023-09-13 LAB — BLOOD GAS, ARTERIAL
Acid-Base Excess: 1.7 mmol/L (ref 0.0–2.0)
Bicarbonate: 28.2 mmol/L — ABNORMAL HIGH (ref 20.0–28.0)
O2 Saturation: 100 %
Patient temperature: 36.4
pCO2 arterial: 50 mmHg — ABNORMAL HIGH (ref 32–48)
pH, Arterial: 7.36 (ref 7.35–7.45)
pO2, Arterial: 372 mmHg — ABNORMAL HIGH (ref 83–108)

## 2023-09-13 LAB — BRAIN NATRIURETIC PEPTIDE: B Natriuretic Peptide: 789.9 pg/mL — ABNORMAL HIGH (ref 0.0–100.0)

## 2023-09-13 MED ORDER — LABETALOL HCL 5 MG/ML IV SOLN: 20.0000 mg | INTRAVENOUS | Status: DC | PRN

## 2023-09-13 MED ORDER — FUROSEMIDE 10 MG/ML IJ SOLN
40.0000 mg | Freq: Two times a day (BID) | INTRAMUSCULAR | Status: DC
  Administered 2023-09-13 – 2023-09-14 (×4): 40 mg via INTRAVENOUS
  Filled 2023-09-13 (×4): qty 4

## 2023-09-13 MED ORDER — MORPHINE SULFATE (PF) 2 MG/ML IV SOLN
2.0000 mg | Freq: Once | INTRAVENOUS | Status: AC
  Administered 2023-09-13: 2 mg via INTRAVENOUS
  Filled 2023-09-13: qty 1

## 2023-09-13 MED ORDER — FUROSEMIDE 10 MG/ML IJ SOLN
40.0000 mg | Freq: Once | INTRAMUSCULAR | Status: AC
  Administered 2023-09-13: 40 mg via INTRAVENOUS
  Filled 2023-09-13: qty 4

## 2023-09-13 NOTE — Progress Notes (Signed)
   Patient Name: [name] Date of Encounter: 09/13/2023 [facility] Cardiologist: None   Interval Summary  .    Had flash pulmonary edema yesterday requiring BiPAP and IV Lasix .  On nasal cannula this morning and without major complaint  Vital Signs .    Vitals:   09/13/23 0530 09/13/23 0600 09/13/23 0627 09/13/23 0822  BP: (!) 163/103  120/68   Pulse:  95 91 83  Resp:    (!) 24  Temp:      TempSrc:      SpO2:  100% 97% 97%  Weight:      Height:        Intake/Output Summary (Last 24 hours) at 09/13/2023 0847 Last data filed at 09/13/2023 0428 Gross per 24 hour  Intake 960 ml  Output 1450 ml  Net -490 ml      09/13/2023    4:30 AM 09/12/2023    5:15 AM 09/11/2023    4:32 AM  Last 3 Weights  Weight (lbs) 240 lb 15.4 oz 242 lb 11.6 oz 250 lb 10.6 oz  Weight (kg) 109.3 kg 110.1 kg 113.7 kg      Telemetry/ECG    Sinus rhythm- Personally Reviewed  Physical Exam .   GEN: No acute distress.   Neck: No JVD Cardiac: RRR, no murmurs, rubs, or gallops.  Respiratory: Anterior coarse breath sounds GI: Soft, nontender, non-distended  MS: Bilateral AKA  Assessment & Plan .     1.  Non-STEMI: Troponin elevation of greater than 24,000.  Left heart catheterization with severe coronary artery disease.  Thought not to be a surgical candidate or candidate for PCI.  Hermione Havlicek continue with medical therapy.  2.  Acute on chronic systolic heart failure: Ejection fraction 20 to 25%.  Went into flash pulmonary edema yesterday.  Has been restarted on Lasix .  Would continue with diuresis with close monitoring of kidney function.  Status difficult due to body habitus.  3.  PVCs: On metoprolol  and improved  4.  Acute on chronic CKD stage IIIa: Creatinine significantly elevated.  Giving diuretics due to flash pulmonary edema.  For questions or updates, please contact [facility] Please consult [email or web address] for contact info under        Signed, Estrella Alcaraz Cortland Ding, MD

## 2023-09-13 NOTE — Progress Notes (Signed)
 0500: Pt is feeling she can not breath. She is on Room Air, SPO2 dropped to 82%. RR greater than 34. Lungs sound wheezy and wet. This nurse gave the patient Duoneb treatment and and put her on 6 L [place]. Pt is using her accessory muscles while breathing.  0510: pt is still complaining she can not breath, this RN called respiratory team, charge nurse and Rapid Response.   0520: RR is in the room, new orders are placed by Dr Achilles Holes.

## 2023-09-13 NOTE — Significant Event (Signed)
 Rapid Response Event Note   Reason for Call :  SOB/hypoxia-SpO2-80s on RA. RT on way to room during RRT call.  Initial Focused Assessment:  Pt lying in bed with eyes open. Her breathing is tachypneic and labored. Lungs wheezy w/o. ABD large, soft. Skin cool to touch.  T-97.6, HR-114, BP-163/103. RR-32, SpO2-100% on 100% bipap  Interventions:  Breathing tx done PTA RRT PCXR-1. Interval worsening aeration to the right lower lung. 2. Pulmonary vascular congestion. EKG-ST, nonspecific ST and T wave abnormality Lasix  40mg  IV Morphine  2mg  IV D-dimer Troponin x2 CBC  CMP BNP ABG-7.36/50/372/28.2-FiO2 turned down to 40% on bipap Plan of Care:  Breathing looks better on bipap. Pt says she feels better. Allow lasix  time to work. Await lab results. Please call RRT if further assistance needed.   Event Summary:   MD Notified: Dr. Achilles Holes Call Time:0510 Arrival Time:0520 End [id number]  Dorrine Gaudy, RN

## 2023-09-13 NOTE — Progress Notes (Signed)
 PROGRESS NOTE    [name]  ZOX:[id number] DOB: 08-23-1955 DOA: 09/06/2023 PCP: Marquetta Sit, MD    Brief Narrative:  Patient is 68 year old female with history of chronic diastolic heart failure, type 2 diabetes on insulin , diabetic polyneuropathy, bilateral BKA and bed bound status  long-term nursing home resident, history of hypertension, hyperlipidemia, chronic iron -deficiency anemia presented to the hospital with shortness of breath from nursing home and found to have non-STEMI.  Routine vital signs at the nursing home revealed low oxygen saturations at 80% on room air.  This prompted her to send to ER.  Patient without particular symptoms but with vague symptoms. In the emergency room on 2 L oxygen.  Troponins [id number].  Started on heparin  infusion, admitted to hospital with cardiology consultation. 4/21, cardiac catheterization with extensive coronary artery disease not amenable to PCI and she is not a candidate for CABG. Also diuresed and followed by heart failure team. Remains in the hospital.  Still getting episodic flash pulmonary edema and shortness of breath.  Subjective:  Patient seen and examined.  Overnight events noted.  Early morning hours, she was found to be acute shortness of breath.  Chest x-ray with pulmonary edema.  Responded to BiPAP and 40 mg of IV Lasix .  On my exam, she was on nasal cannula oxygen.  Looks sick.  Patient herself always tells me she is doing fine. See goal of care discussion below. Overnight electrolytes are adequate. Creatinine is 2.2.  BNP 789 and troponins 1800. There was order to do VQ scan overnight, patient has clear explanation of her episodic pulmonary edema due to fluid overload and congestive heart failure with EF 20%.  Will not pursue VQ scan at this time.  Assessment & Plan:   Non-STEMI: Presented with shortness of breath.  Acute CHF. Cardiac catheterization with severe multivessel CAD. Remains on DAPT and high  intensity statin.  Completed 48 hours of heparin  infusion. Patient is not a candidate for PCI.  She is not a good candidate for CABG.  Medical management advised. Remains on hydralazine  and nitrates.  Acute on chronic systolic and diastolic congestive heart failure:  With previous history of diastolic heart failure.   Repeat echocardiogram with ejection fraction of 20 to 25%.  Grade 2 diastolic dysfunction.   Intermittently treated with Lasix , was on hold due to renal function abnormalities.  Creatinine fluctuates but now is stable.   4/25 , episode of shortness of breath improved with diuresis.   4/27 , respiratory distress due to pulmonary edema improved with BiPAP and IV Lasix .   Start patient on Lasix  40 mg IV twice daily.  Continue Foley catheter today.  Keep on oxygen to keep saturation more than 90%.  Keep BiPAP at bedside for shortness of breath, increased work of breathing. Poor prognosis.  Goal of care discussion ongoing.  Type 2 diabetes: Well-controlled.  A1c 7.3.  On Lantus  and sliding scale.  Acceptable today.  AKI on CKD stage IIIa: Recent baseline creatinine of 1.23.  Creatinine gradually going up 1.76-2.25-2.5 2.7-2.6-2.2 . Resuming diuretics.  Essential hypertension, stable Hyperlipidemia, stable Chronic iron  deficiency anemia, stable Bilateral amputee, long-term nursing home resident   Goal of care: Patient does not have designated healthcare power of attorney however she designates her niece Ms. Tamikia as her spokesperson and is planning to designate her as healthcare power of attorney.  She also has sister and aunt and they all help make her combined decisions. Previously discussed with patient's sister and aunt and  has updated them on patient's severe medical condition and a reversible cause for congestive heart failure. Today called patient's niece [name], updated her about episodes of flash pulmonary edema.  Also discussed about severe congestive heart  failure, nonrepairable coronary artery disease and difficult to treat conditions including irreversible causes. [name] will coordinate with her other family members and update them about the situation. Discussed with patient about CODE STATUS, she tells me that she would like to live on a ventilator if needed.  Encouraged her to think about it, it will be nonreversible condition and futile and painful if it comes to doing CPR and mechanical ventilator on her.  She would like to discuss this with her sisters and niece. Palliative care trying to arrange meeting with family today.   Pressure Injury 09/07/23 Sacrum Right;Left;Mid Stage 1 -  Intact skin with non-blanchable redness of a localized area usually over a bony prominence. (Active)  09/07/23 0020  Location: Sacrum  Location Orientation: Right;Left;Mid  Staging: Stage 1 -  Intact skin with non-blanchable redness of a localized area usually over a bony prominence.  Wound Description (Comments):   Present on Admission: Yes        DVT prophylaxis: SCDs Start: 09/06/23 2052   Code Status: Full code Family Communication: Niece called and updated. Disposition Plan: Status is: Inpatient Remains inpatient appropriate because: IV diuresis, monitoring renal functions.       Consultants:  Cardiology Palliative care  Procedures:  Cardiac cath  Antimicrobials:  None     Objective: Vitals:   09/13/23 0600 09/13/23 0627 09/13/23 0812 09/13/23 0822  BP:  120/68 (!) 142/76   Pulse: 95 91 82 83  Resp:    (!) 24  Temp:   98 F (36.7 C)   TempSrc:   Oral   SpO2: 100% 97% 100% 97%  Weight:      Height:        Intake/Output Summary (Last 24 hours) at 09/13/2023 1104 Last data filed at 09/13/2023 0428 Gross per 24 hour  Intake 840 ml  Output 1450 ml  Net -610 ml   Filed Weights   09/11/23 0432 09/12/23 0515 09/13/23 0430  Weight: 113.7 kg 110.1 kg 109.3 kg    Examination:  General: In mild distress.  Chronically sick  looking.  Frail and debilitated.  Morbidly obese. Cardiovascular: S1-S2 normal.  Regular rate rhythm. Respiratory: In moderate respiratory distress.  On supplemental oxygen.  Extensive expiratory wheezes and conducted upper airway sounds. Gastrointestinal: Soft.  Nontender.  Obese and pendulous.  Foley catheter with clear urine. Ext: Difficult to assess edema, she has bilateral BKA. Neuro: Alert and awake.  Today she is sick looking.  She is mostly oriented and able to keep up the conversation.      Data Reviewed: I have personally reviewed following labs and imaging studies  CBC: Recent Labs  Lab 09/06/23 1442 09/07/23 0212 09/07/23 1815 09/07/23 1844 09/08/23 0325 09/09/23 0438 09/10/23 0415 09/13/23 0537  WBC 17.8* 15.2*  --   --  12.2* 13.3* 12.2* 16.8*  NEUTROABS 15.7*  --   --   --   --   --   --   --   HGB 11.6* 11.2*   < > 10.9* 10.0* 9.8* 9.5* 10.8*  HCT 39.9 37.8   < > 32.0* 33.0* 31.8* 30.6* 34.9*  MCV 87.9 85.3  --   --  83.8 82.8 82.0 81.5  PLT 271 224  --   --  245 226  229 280   < > = values in this interval not displayed.   Basic Metabolic Panel: Recent Labs  Lab 09/07/23 0212 09/07/23 1815 09/09/23 0438 09/10/23 0415 09/11/23 0317 09/12/23 0359 09/13/23 0430  NA 140   < > 137 133* 134* 136 137  K 4.7   < > 3.8 3.6 4.0 3.7 3.5  CL 106   < > 97* 94* 94* 95* 98  CO2 22   < > 26 27 26 29 28   GLUCOSE 221*   < > 106* 92 126* 141* 122*  BUN 24*   < > 35* 40* 41* 40* 38*  CREATININE 1.50*   < > 2.25* 2.52* 2.70* 2.62* 2.21*  CALCIUM  8.0*   < > 7.6* 7.4* 7.1* 7.1* 7.3*  MG 1.9  --  2.2  --   --   --   --    < > = values in this interval not displayed.   GFR: Estimated Creatinine Clearance: 30.5 mL/min (A) (by C-G formula based on SCr of 2.21 mg/dL (H)). Liver Function Tests: Recent Labs  Lab 09/06/23 1442 09/07/23 0212 09/13/23 0537  AST 32 112* 25  ALT 18 37 25  ALKPHOS 106 129* 120  BILITOT 1.3* 0.7 0.8  PROT 5.7* 6.6 7.2  ALBUMIN  2.0* 2.6*  2.5*   No results for input(s): "LIPASE", "AMYLASE" in the last 168 hours. No results for input(s): "AMMONIA" in the last 168 hours. Coagulation Profile: No results for input(s): "INR", "PROTIME" in the last 168 hours. Cardiac Enzymes: No results for input(s): "CKTOTAL", "CKMB", "CKMBINDEX", "TROPONINI" in the last 168 hours. BNP (last 3 results) No results for input(s): "PROBNP" in the last 8760 hours. HbA1C: No results for input(s): "HGBA1C" in the last 72 hours.  CBG: Recent Labs  Lab 09/12/23 0632 09/12/23 1130 09/12/23 1606 09/12/23 2050 09/13/23 0521  GLUCAP 140* 198* 173* 143* 127*   Lipid Profile: No results for input(s): "CHOL", "HDL", "LDLCALC", "TRIG", "CHOLHDL", "LDLDIRECT" in the last 72 hours.  Thyroid  Function Tests: No results for input(s): "TSH", "T4TOTAL", "FREET4", "T3FREE", "THYROIDAB" in the last 72 hours. Anemia Panel: No results for input(s): "VITAMINB12", "FOLATE", "FERRITIN", "TIBC", "IRON ", "RETICCTPCT" in the last 72 hours.  Sepsis Labs: Recent Labs  Lab 09/07/23 0211  PROCALCITON 0.10    Recent Results (from the past 240 hours)  Resp panel by RT-PCR (RSV, Flu A&B, Covid) Anterior Nasal Swab     Status: None   Collection Time: 09/06/23  2:05 PM   Specimen: Anterior Nasal Swab  Result Value Ref Range Status   SARS Coronavirus 2 by RT PCR NEGATIVE NEGATIVE Final   Influenza A by PCR NEGATIVE NEGATIVE Final   Influenza B by PCR NEGATIVE NEGATIVE Final    Comment: (NOTE) The Xpert Xpress SARS-CoV-2/FLU/RSV plus assay is intended as an aid in the diagnosis of influenza from Nasopharyngeal swab specimens and should not be used as a sole basis for treatment. Nasal washings and aspirates are unacceptable for Xpert Xpress SARS-CoV-2/FLU/RSV testing.  Fact Sheet for Patients: BloggerCourse.com  Fact Sheet for Healthcare Providers: SeriousBroker.it  This test is not yet approved or cleared by  the [place]  FDA and has been authorized for detection and/or diagnosis of SARS-CoV-2 by FDA under an Emergency Use Authorization (EUA). This EUA will remain in effect (meaning this test can be used) for the duration of the COVID-19 declaration under Section 564(b)(1) of the Act, 21 U.S.C. section 360bbb-3(b)(1), unless the authorization is terminated or revoked.  Resp Syncytial Virus by PCR NEGATIVE NEGATIVE Final    Comment: (NOTE) Fact Sheet for Patients: BloggerCourse.com  Fact Sheet for Healthcare Providers: SeriousBroker.it  This test is not yet approved or cleared by the [place]  FDA and has been authorized for detection and/or diagnosis of SARS-CoV-2 by FDA under an Emergency Use Authorization (EUA). This EUA will remain in effect (meaning this test can be used) for the duration of the COVID-19 declaration under Section 564(b)(1) of the Act, 21 U.S.C. section 360bbb-3(b)(1), unless the authorization is terminated or revoked.  Performed at [facility], [address], [place], [place] [address]   MRSA Next Gen by PCR, Nasal     Status: None   Collection Time: 09/07/23  1:10 AM   Specimen: Nasal Mucosa; Nasal Swab  Result Value Ref Range Status   MRSA by PCR Next Gen NOT DETECTED NOT DETECTED Final    Comment: (NOTE) The GeneXpert MRSA Assay (FDA approved for NASAL specimens only), is one component of a comprehensive MRSA colonization surveillance program. It is not intended to diagnose MRSA infection nor to guide or monitor treatment for MRSA infections. Test performance is not FDA approved in patients less than 85 years old. Performed at [facility], [address] [place], [place] [address]   MRSA Next Gen by PCR, Nasal     Status: None   Collection Time: 09/07/23  7:32 PM   Specimen: Nasal Mucosa; Nasal Swab  Result Value Ref Range Status   MRSA by PCR Next Gen NOT DETECTED NOT  DETECTED Final    Comment: (NOTE) The GeneXpert MRSA Assay (FDA approved for NASAL specimens only), is one component of a comprehensive MRSA colonization surveillance program. It is not intended to diagnose MRSA infection nor to guide or monitor treatment for MRSA infections. Test performance is not FDA approved in patients less than 83 years old. Performed at [facility], [address] [place], [place] [address]          Radiology Studies: DG Chest Port 1 View Result Date: 09/13/2023 CLINICAL DATA:  Respiratory distress. EXAM: PORTABLE CHEST 1 VIEW COMPARISON:  09/11/2023 FINDINGS: There is a right arm PICC line with tip at the distal SVC. Stable cardiomediastinal contours. Aortic atherosclerosis. Pulmonary vascular congestion. Bilateral veil like opacification overlying the lower lung zones, right greater than left. Imaging findings are compatible with bilateral posterior scratch set imaging findings compatible with previous pleural effusions and overlying atelectasis/consolidation. Compared with the previous exam there is been interval worsening aeration to the right lower lung. IMPRESSION: 1. Interval worsening aeration to the right lower lung. 2. Pulmonary vascular congestion. Electronically Signed   By: Kimberley Penman M.D.   On: 09/13/2023 06:04   DG CHEST PORT 1 VIEW Result Date: 09/11/2023 CLINICAL DATA:  Short of breath EXAM: PORTABLE CHEST 1 VIEW COMPARISON:  09/10/2023 FINDINGS: Single frontal view of the chest demonstrates stable enlargement of the cardiac silhouette. Right-sided PICC tip overlies superior vena cava. Veiling opacities are seen at the lung bases, right greater than left, consistent with dependent consolidation and effusions seen on prior CT. No pneumothorax. No acute bony abnormalities. IMPRESSION: 1. Stable enlarged cardiac silhouette. 2. Bibasilar veiling opacities, right greater than left, consistent with consolidation and effusions on prior CT. No  significant change. Electronically Signed   By: Bobbye Burrow M.D.   On: 09/11/2023 16:54         Scheduled Meds:  aspirin   81 mg Oral Daily  atorvastatin   80 mg Oral Daily   Chlorhexidine  Gluconate Cloth  6 each Topical Daily   clopidogrel   75 mg Oral Daily   ferrous sulfate   325 mg Oral BID WC   furosemide   40 mg Intravenous BID   hydrALAZINE   25 mg Oral Q8H   insulin  aspart  0-15 Units Subcutaneous TID WC   insulin  glargine-yfgn  15 Units Subcutaneous BID   ipratropium-albuterol   3 mL Nebulization Once   isosorbide  dinitrate  10 mg Oral BID   metoprolol  succinate  25 mg Oral Daily   polyethylene glycol  17 g Oral Daily   senna-docusate  1 tablet Oral BID   sodium chloride  flush  10-40 mL Intracatheter Q12H   sodium chloride  flush  3 mL Intravenous Q12H   Continuous Infusions:     LOS: 7 days      Vada Garibaldi, MD [facility]

## 2023-09-13 NOTE — Progress Notes (Addendum)
 Daily Progress Note   Patient Name: [name]       Date: 09/13/2023 DOB: 1955/06/05  Age: 68 y.o. MRN#: [id number] Attending Physician: [name] Garibaldi, MD Primary Care Physician: [name] Zapata, Gerardo E, MD Admit Date: 09/06/2023  Reason for Consultation/Follow-up: Establishing goals of care  Length of Stay: 7  Current Medications: Scheduled Meds:   aspirin   81 mg Oral Daily   atorvastatin   80 mg Oral Daily   Chlorhexidine  Gluconate Cloth  6 each Topical Daily   clopidogrel   75 mg Oral Daily   ferrous sulfate   325 mg Oral BID WC   furosemide   40 mg Intravenous BID   hydrALAZINE   25 mg Oral Q8H   insulin  aspart  0-15 Units Subcutaneous TID WC   insulin  glargine-yfgn  15 Units Subcutaneous BID   ipratropium-albuterol   3 mL Nebulization Once   isosorbide  dinitrate  10 mg Oral BID   metoprolol  succinate  25 mg Oral Daily   polyethylene glycol  17 g Oral Daily   senna-docusate  1 tablet Oral BID   sodium chloride  flush  10-40 mL Intracatheter Q12H   sodium chloride  flush  3 mL Intravenous Q12H    Continuous Infusions:   PRN Meds: acetaminophen  **OR** acetaminophen , ipratropium-albuterol , labetalol , melatonin, ondansetron  (ZOFRAN ) IV, mouth rinse, sodium chloride  flush, sodium chloride  flush  Physical Exam Vitals reviewed.  Constitutional:      General: She is not in acute distress.    Appearance: She is ill-appearing.  HENT:     Head: Normocephalic and atraumatic.  Cardiovascular:     Rate and Rhythm: Normal rate.  Pulmonary:     Effort: Pulmonary effort is normal.  Musculoskeletal:     Comments: BKA bilateral  Skin:    General: Skin is warm and dry.  Neurological:     Mental Status: She is alert and oriented to person, place, and time.             Vital  Signs: BP 127/69 (BP Location: Right Arm)   Pulse 84   Temp 97.6 F (36.4 C) (Oral)   Resp 20   Ht 5\' 6"  (1.676 m)   Wt 109.3 kg   SpO2 99%   BMI 38.89 kg/m  SpO2: SpO2: 99 % O2 Device: O2 Device: Nasal Cannula O2 Flow Rate: O2 Flow Rate (  L/min): 3 L/min        Palliative Assessment/Data: 30%      Patient Active Problem List   Diagnosis Date Noted   Acute renal failure superimposed on stage 3a chronic kidney disease (HCC) 09/08/2023   Leukocytosis 09/07/2023   Chronic diastolic CHF (congestive heart failure) (HCC) 09/07/2023   DM2 (diabetes mellitus, type 2) (HCC) 09/07/2023   Chronic iron  deficiency anemia 09/07/2023   Acute on chronic combined systolic and diastolic CHF (congestive heart failure) (HCC) 09/07/2023   NSTEMI (non-ST elevated myocardial infarction) (HCC) 09/06/2023   Heme positive stool    Rectal polyp    Adenomatous polyp of transverse colon    Gastritis and gastroduodenitis    Phantom limb pain (HCC) 11/09/2017   Other abnormalities of gait and mobility 11/09/2017   S/P BKA (below knee amputation) bilateral (HCC) 08/20/2015   Dental caries 08/20/2015   Constipation 08/15/2015   Depression 08/15/2015   Pain in shoulder 06/28/2015   Hemiparesis (HCC) 01/10/2013   Tobacco abuse    Paresthesia of right arm and leg 12/17/2011   Facial droop 12/15/2011   TIA (transient ischemic attack) 12/15/2011   Syncope 07/02/2011   HTN (hypertension) 07/02/2011   Lower urinary tract infectious disease 07/02/2011   Normocytic anemia 07/02/2011   Hereditary and idiopathic peripheral neuropathy 04/02/2009   Essential hypertension 04/02/2009   CAD, NATIVE VESSEL 04/02/2009   HIATAL HERNIA 04/02/2009   Irritable bowel syndrome 04/02/2009   HLD (hyperlipidemia) 05/30/2008   OBESITY 05/30/2008   LEFT VENTRICULAR FUNCTION, DECREASED 05/30/2008   CVA (cerebral infarction) 05/30/2008   GERD 05/30/2008    Palliative Care Assessment & Plan   Patient Profile: 68  y.o. female  with past medical history of chronic diastolic heart failure, type 2 diabetes on insulin , diabetic polyneuropathy, bilateral BKA and bed bound status, long-term nursing home resident, history of hypertension, hyperlipidemia, chronic iron -deficiency anemia  admitted on 09/06/2023 with shortness of breath from nursing home and found to have NSTEMI .    4/21 cardiac catheterization with extensive coronary artery disease not amenable to PCI and she is not a candidate for CABG.   Today's Discussion: Patient lying in bed. She alternates between having her eyes open and closed but responds appropriately during our conversation. Several family members at bedside and by phone for GOC discussion including niece [name], sisters [name] and [name], brother [name], aunt [name], and niece [name].  [name] shares her understanding of the patient's chronic condition and current hospitalization. We discuss the patient's comorbidities including heart failure and kidney disease. We discuss her current hospitalization-- including AKI on CKD, EF 20%, extensive CAD not amenable to PCI and not a candidate for CABG, and flash pulmonary edema. We discuss how fragile the patient's health is right now. Many questions answered.   WE discussed advanced care planning. Patient shares she would like her niece [name] and sister [name] as proxy decision makers and would like to have HCPOA paperwork completed. WE discussed code status.Recommended consideration of DNR status, understanding evidenced-based poor outcomes in similar hospitalized patients, as the cause of the arrest is likely associated with chronic/terminal disease rather than a reversible acute cardio-pulmonary event. Patient states she wants "to live" and would like to remain a full code. We discuss the difference between an aggressive care path and comfort care path-- she would like to remain full scope of care. I ask her if there is a situation she can think of where  her quality of life would be poor enough that  she would not want to extend her life with aggressive measures and she cannot think of one.  I share with the patient and family that she would be likely be eligible for hospice services if/when she decides to pursue comfort measures. This surprises both the patent and many of her family members. I share that I am concerned she is at high risk for decompensation and escalation of care. Emotional support provided. I encouraged the patient and family to consider her quality of life, overall suffering, what would be acceptable to her in the future.  Patient's sister [name] shares their mother "lived for a while" with a similar medical history. She seems most optimistic of the family members. (Later outside the room Tameka shared that the patient's mother died at LTAC). Several family members told me privately outside the room they do not want to see the patient suffer but also want to support the patient's decisions.  We discuss allowing time for the patient and family to consider this discussion. Hard Choices book given and emailed to family.   Recommendations/Plan: Full code Full scope Patient would like to complete HCPOA paperwork:  consulted Continued outpatient PMT support PMT to follow    Code Status:    Code Status Orders  (From admission, onward)           Start     Ordered   09/06/23 2052  Full code  Continuous       Question:  By:  Answer:  Consent: discussion documented in EHR   09/06/23 2052           Extensive chart review has been completed prior to seeing the patient including labs, vital signs, imaging, progress/consult notes, orders, medications, and available advance directive documents. Updates received from nursing and Dr. Hilton Lucky.   Care plan was discussed with bedside RN and Dr. Hilton Lucky  Time spent 105 minutes  Thank you for allowing the Palliative Medicine Team to assist in the care of this  patient.     Daina Drum, NP  Please contact Palliative Medicine Team phone at [phone] for questions and concerns.

## 2023-09-13 NOTE — Progress Notes (Addendum)
 Rapid Response Event Note    Reason for Call :  SOB/hypoxia-SpO2-80s on RA. RT on way to room during RRT call.   Initial Focused Assessment by RN:  Pt lying in bed with eyes open. Her breathing is tachypneic and labored. Lungs wheezy w/o. ABD large, soft. Skin cool to touch.   T-97.6, HR-114, BP-163/103. RR-32, SpO2-100% on 100% bipap   Interventions:  Nebulized bronchodilator tx done PTA RRT PCXR-1. Interval worsening aeration to the right lower lung. 2. Pulmonary vascular congestion. EKG-ST 103, nonspecific ST and T wave abnormality with poor RW progression. Lasix  40mg  IV Morphine  2mg  IV D-dimer came back 2.07...  She completed 48 hours of heparin  infusion.  VQ scan will be obtained. Troponin x2..first 1930 down from >24,000 CBC with leukocytosis of 16.8 up from 12.2, H&H 10.8 and 34.9 better than previous levels. CMP with borderline potassium of 3.5, BUN of 38 and creatinine 2.21 down from before. BNP ABG-7.36/50/372/28.2-FiO2 turned down to 40% on bipap Plan of Care:  Breathing looks better on bipap. Pt says she feels better.  She improved with IV Lasix  and IV morphine  sulfate..  Will continue to closely monitor her.

## 2023-09-14 DIAGNOSIS — Z515 Encounter for palliative care: Secondary | ICD-10-CM | POA: Diagnosis not present

## 2023-09-14 DIAGNOSIS — L8990 Pressure ulcer of unspecified site, unspecified stage: Secondary | ICD-10-CM

## 2023-09-14 DIAGNOSIS — I255 Ischemic cardiomyopathy: Secondary | ICD-10-CM

## 2023-09-14 DIAGNOSIS — I214 Non-ST elevation (NSTEMI) myocardial infarction: Secondary | ICD-10-CM | POA: Diagnosis not present

## 2023-09-14 DIAGNOSIS — Z7189 Other specified counseling: Secondary | ICD-10-CM | POA: Diagnosis not present

## 2023-09-14 LAB — BASIC METABOLIC PANEL WITH GFR
Anion gap: 13 (ref 5–15)
BUN: 41 mg/dL — ABNORMAL HIGH (ref 8–23)
CO2: 28 mmol/L (ref 22–32)
Calcium: 7.2 mg/dL — ABNORMAL LOW (ref 8.9–10.3)
Chloride: 96 mmol/L — ABNORMAL LOW (ref 98–111)
Creatinine, Ser: 2.29 mg/dL — ABNORMAL HIGH (ref 0.44–1.00)
GFR, Estimated: 23 mL/min — ABNORMAL LOW (ref >60)
Glucose, Bld: 141 mg/dL — ABNORMAL HIGH (ref 70–99)
Potassium: 3.7 mmol/L (ref 3.5–5.1)
Sodium: 137 mmol/L (ref 135–145)

## 2023-09-14 LAB — GLUCOSE, CAPILLARY
Glucose-Capillary: 132 mg/dL — ABNORMAL HIGH (ref 70–99)
Glucose-Capillary: 147 mg/dL — ABNORMAL HIGH (ref 70–99)
Glucose-Capillary: 141 mg/dL — ABNORMAL HIGH (ref 70–99)
Glucose-Capillary: 152 mg/dL — ABNORMAL HIGH (ref 70–99)
Glucose-Capillary: 139 mg/dL — ABNORMAL HIGH (ref 70–99)

## 2023-09-14 NOTE — Care Management Important Message (Signed)
 Important Message  Patient Details  Name: [name] MRN: [id number] Date of Birth: 05-13-1956   Important Message Given:  Yes - Medicare IM     Janith Melnick 09/14/2023, 10:28 AM

## 2023-09-14 NOTE — Plan of Care (Signed)
  Problem: Education: Goal: Ability to describe self-care measures that may prevent or decrease complications (Diabetes Survival Skills Education) will improve Outcome: Progressing Goal: Individualized Educational Video(s) Outcome: Progressing   Problem: Coping: Goal: Ability to adjust to condition or change in health will improve Outcome: Progressing   Problem: Fluid Volume: Goal: Ability to maintain a balanced intake and output will improve Outcome: Progressing   Problem: Metabolic: Goal: Ability to maintain appropriate glucose levels will improve Outcome: Progressing   Problem: Skin Integrity: Goal: Risk for impaired skin integrity will decrease Outcome: Progressing   Problem: Tissue Perfusion: Goal: Adequacy of tissue perfusion will improve Outcome: Progressing   Problem: Education: Goal: Knowledge of General Education information will improve Description: Including pain rating scale, medication(s)/side effects and non-pharmacologic comfort measures Outcome: Progressing

## 2023-09-14 NOTE — Progress Notes (Addendum)
 Progress Note  Patient Name: [name] Date of Encounter: 09/14/2023  Primary Cardiologist: None   Subjective   She is without complaint today. She recalls her acute dyspnea from this weekend but notes the symptoms have not returned yesterday/today. She denies pain, dyspnea, fevers, chills. Continues to wear oxygen. Slept well and is eating breakfast.   Breathing notably better than this weekend.  No chest pain or pressure.  Inpatient Medications    Scheduled Meds:  aspirin   81 mg Oral Daily   atorvastatin   80 mg Oral Daily   Chlorhexidine  Gluconate Cloth  6 each Topical Daily   clopidogrel   75 mg Oral Daily   ferrous sulfate   325 mg Oral BID WC   furosemide   40 mg Intravenous BID   hydrALAZINE   25 mg Oral Q8H   insulin  aspart  0-15 Units Subcutaneous TID WC   insulin  glargine-yfgn  15 Units Subcutaneous BID   ipratropium-albuterol   3 mL Nebulization Once   isosorbide  dinitrate  10 mg Oral BID   metoprolol  succinate  25 mg Oral Daily   polyethylene glycol  17 g Oral Daily   senna-docusate  1 tablet Oral BID   sodium chloride  flush  10-40 mL Intracatheter Q12H   sodium chloride  flush  3 mL Intravenous Q12H   PRN Meds: acetaminophen  **OR** acetaminophen , ipratropium-albuterol , labetalol , melatonin, ondansetron  (ZOFRAN ) IV, mouth rinse, sodium chloride  flush, sodium chloride  flush   Vital Signs    Vitals:   09/14/23 0517 09/14/23 0616 09/14/23 0748 09/14/23 1114  BP: 135/73 135/73 (!) 123/59 (!) 131/59  Pulse: 77  78 77  Resp: 19  20 20   Temp: 97.8 F (36.6 C)  (!) 97.5 F (36.4 C) 97.8 F (36.6 C)  TempSrc: Oral  Axillary Oral  SpO2: 99%  100% 100%  Weight: 104.8 kg     Height:        Intake/Output Summary (Last 24 hours) at 09/14/2023 1146 Last data filed at 09/14/2023 0529 Gross per 24 hour  Intake 13 ml  Output 900 ml  Net -887 ml   Filed Weights   09/12/23 0515 09/13/23 0430 09/14/23 0517  Weight: 110.1 kg 109.3 kg 104.8 kg    Telemetry     NSR - Personally Reviewed   Physical Exam   GEN: Chronically ill-appearing. No acute distress. Wearing oxygen [place] 3L.  Morbidly obese/chronically ill Neck: No JVD unable to assess due to body habitus Cardiac: RRR, (distant heart sounds-unable to assess for murmurs rubs or gallops)) no murmurs, rubs, or gallops.  Respiratory: Rales bilaterally. GI: Soft, nontender, non-distended  MS: No edema, bilateral BKAs. Neuro:  Nonfocal  Psych: Normal affect   Labs    Chemistry Recent Labs  Lab 09/12/23 0359 09/13/23 0430 09/13/23 0537 09/14/23 0500  NA 136 137  --  137  K 3.7 3.5  --  3.7  CL 95* 98  --  96*  CO2 29 28  --  28  GLUCOSE 141* 122*  --  141*  BUN 40* 38*  --  41*  CREATININE 2.62* 2.21*  --  2.29*  CALCIUM  7.1* 7.3*  --  7.2*  PROT  --   --  7.2  --   ALBUMIN   --   --  2.5*  --   AST  --   --  25  --   ALT  --   --  25  --   ALKPHOS  --   --  120  --   BILITOT  --   --  0.8  --   GFRNONAA 19* 24*  --  23*  ANIONGAP 12 11  --  13     Hematology Recent Labs  Lab 09/09/23 0438 09/09/23 1101 09/10/23 0415 09/13/23 0537  WBC 13.3*  --  12.2* 16.8*  RBC 3.84* 3.94 3.73* 4.28  HGB 9.8*  --  9.5* 10.8*  HCT 31.8*  --  30.6* 34.9*  MCV 82.8  --  82.0 81.5  MCH 25.5*  --  25.5* 25.2*  MCHC 30.8  --  31.0 30.9  RDW 19.1*  --  18.4* 18.6*  PLT 226  --  229 280    Cardiac EnzymesNo results for input(s): "TROPONINI" in the last 168 hours. No results for input(s): "TROPIPOC" in the last 168 hours.   BNP Recent Labs  Lab 09/13/23 0537  BNP 789.9*     DDimer  Recent Labs  Lab 09/13/23 0537  DDIMER 2.07*     Radiology    DG Chest Port 1 View Result Date: 09/13/2023 CLINICAL DATA:  Respiratory distress. EXAM: PORTABLE CHEST 1 VIEW COMPARISON:  09/11/2023 FINDINGS: There is a right arm PICC line with tip at the distal SVC. Stable cardiomediastinal contours. Aortic atherosclerosis. Pulmonary vascular congestion. Bilateral veil like opacification overlying  the lower lung zones, right greater than left. Imaging findings are compatible with bilateral posterior scratch set imaging findings compatible with previous pleural effusions and overlying atelectasis/consolidation. Compared with the previous exam there is been interval worsening aeration to the right lower lung. IMPRESSION: 1. Interval worsening aeration to the right lower lung. 2. Pulmonary vascular congestion. Electronically Signed   By: Kimberley Penman M.D.   On: 09/13/2023 06:04    Cardiac Studies   ECHO 4/21 with EF 20-25%, global hypokinesis of LV, concentric LV hypertrophy, mild reduced RV systolic function, moderate tricuspid regurgitation. R&LHC 4/21:  Severe MV CAD - Ost RCA 90%-50% prox RCA & 100% CTO mRCA-bifurcation; ost-prox LAD ~30% with 80% heavily calcified focal lesion at D1 and 40% mid LAD.  (Collaterals from septals fill PDA); RI (same caliber as LAD) has 90% proximal stenosis; tiny caliber LCx with 1 tiny OM that bifurcates into OM 2 with sidebranch and LPL 1-very small caliber and not a PCI target. Severe PAH: Significant portion WHO Class II related to Acute on Chronic Combined HF PAP-mean 54/42-44 mmHg; PCWP 34 mmHg AO sat 100%, PA sat 53%.  Cardiac Output/Index by Albertina Hugger 4.09-1.86  Dominance: Right    Patient Profile     68 y.o. female with history of nonobstructive CAD, HF with recovered EF, insulin  dependent DM II, bilateral AKA, CVA, CKD IIIa. Admitted with NSTEMI and acute respiratory failure 2/2 CHF.  Assessment & Plan   Principal Problem:   NSTEMI (non-ST elevated myocardial infarction) (HCC) Active Problems:   Acute on chronic combined systolic and diastolic CHF (congestive heart failure) (HCC)   Ischemic cardiomyopathy   Acute renal failure superimposed on stage 3a chronic kidney disease (HCC)   HLD (hyperlipidemia)   Essential hypertension   S/P BKA (below knee amputation) bilateral (HCC)   Leukocytosis   Chronic diastolic CHF (congestive heart failure)  (HCC)   DM2 (diabetes mellitus, type 2) (HCC)   Chronic iron  deficiency anemia   Pressure injury of skin    NSTEMI with multivessel disease and ischemic cardiomyopathy Troponin > 24k at admission last week. LHC with mulitivessal CAD but not a CABG candidate and poorly amenable to PCI. Continue medical management. She is without CP. - DAPT aspirin  and plavix  and  high intensity atorvastatin  80 every day => Unfortunately, with AKI in the setting of acute diuresis and diagnostic cath, PCI options are limited unless renal function stabilizes.  Both the LAD and RI have targets for PCI, but risk for recurrent renal failure is prohibitive at this time.   Acute on Chronic combined systolic/diastolic HF with EF 86-57% Flash Pulmonary Edema In past recovered EF but Echo 4/21 demonstarted EF 20-25%. Diuretics held last week per renal function, resumed Saturday from flash pulmonary edema. Volume exam challenging given habitus but she continues with good urine output. Remains on 3L oxygen, some rales on exam. - Continue lasix  40 IV BID, will use Cr increase as guide for volume status - Toprol  XL 25 daily => could potentially titrate as renal function and volume status stabilizes - Resume aldactone  at later date for GDMT (depending on improvement in renal function). Can consider SGLT2i later but limited baseline functionality makes her a poor candidate.;  GLP-1 agonist would actually be a better option. - hydralazine /isordil  for afterload reduction (hydralazine  25 mg 3 times daily with Isordil  10 mg twice daily-can titrate up as BP will tolerate) - wean O2 as tolerated   PVCs On metoprolol , burden improved  Acute on chronic kidney failure (baseline CKD 3A) Creatinine stable above 2, baseline 1.2, will monitor with ongoing diuresis.  Will continue to follow.    For questions or updates, please contact [facility] Please consult [email or web address] for contact info under Cardiology/STEMI.      Kae Oram, DO  09/14/2023, 11:46 AM    ATTENDING ATTESTATION  I have seen, examined and evaluated the patient this morning on rounds along with the resident physician- Dr. Juberg (IM R1).  After reviewing all the available data and chart, we discussed the patients laboratory, study & physical findings as well as symptoms in detail.  I agree with his findings, examination as well as impression recommendations as per our discussion.    Attending adjustments noted in italics.   Stabilized out after her recent flash pulm edema episodes.  I suspect that she was not adequately diuresed on initial attempt to discharge.  Relatively brisk diuresis overnight, would continue IV Lasix  for now and monitor for plateauing and uptrend of BUN/Cr as a marker of volume status.  Gradually titrate GDMT meds for CAD and combined CHF but limited by acute on chronic renal insufficiency.  The biggest issue is that her presenting symptoms are probably ischemic in nature however this most recent episode only noted a downward trend in troponin so was not likely a new ischemic event related to pulm edema.  I suspect Esther volume level is not fully managed, she has increased wall stress leading to angina and therefore progressive diastolic dysfunction resulting in flash pulm edema.  As such she requires more diuresis and titration of afterload reduction. The underlying question of CAD is whether or not this could be the culprit for her flash pulm edema, but in the setting of acute on chronic renal failure, we are limited by invasive options for revascularization at this time but could consider once she is seen to be stable in the outpatient setting     Arleen Lacer, MD, MS Randene Bustard, M.D., M.S. Interventional Cardiologist  [facility]  Pager # [phone] Phone # [phone] [address] [place], [place] [address]

## 2023-09-14 NOTE — Care Management Important Message (Signed)
 Important Message  Patient Details  Name: [name] MRN: [id number] Date of Birth: 10/03/1955   Important Message Given:  Yes - Medicare IM     Janith Melnick 09/14/2023, 11:41 AM

## 2023-09-14 NOTE — Progress Notes (Signed)
 PROGRESS NOTE    [name]  ZOX:[id number] DOB: [birth year] DOA: 09/06/2023 PCP: [name] Sit, MD    Brief Narrative:  Patient is 68 year old female with history of chronic diastolic heart failure, type 2 diabetes on insulin , diabetic polyneuropathy, bilateral BKA and bed bound status  long-term nursing home resident, history of hypertension, hyperlipidemia, chronic iron -deficiency anemia presented to the hospital with shortness of breath from nursing home and found to have non-STEMI.  Routine vital signs at the nursing home revealed low oxygen saturations at 80% on room air.  This prompted her to send to ER.  Patient without particular symptoms but with vague symptoms. In the emergency room on 2 L oxygen.  Troponins [id number].  Started on heparin  infusion, admitted to hospital with cardiology consultation. 4/21, cardiac catheterization with extensive coronary artery disease not amenable to PCI and she is not a candidate for CABG. Also diuresed and followed by heart failure team. Remains in the hospital.  Still gets episodic flash pulmonary edema and shortness of breath.  Subjective:  Patient seen and examined.  No overnight events.  No episodes of shortness of breath since last 24 hours at least. On IV Lasix  40 mg twice daily. Urine output 900 mL last 24 hours, Foley bag is full.  She is on 2 to 3 L of oxygen.  Not use BiPAP. 9 L negative balance since admission.  Multiple and extensive palliative care discussions yesterday, patient insisted on being full code. Discussed with patient that we will monitor her next 24 hours, if no episodes of shortness of breath will change to oral water pill and discharged her to nursing home.  She is agreeable.   Assessment & Plan:   Non-STEMI: Presented with shortness of breath.  Acute CHF. Cardiac catheterization with severe multivessel CAD. Remains on DAPT and high intensity statin.  Completed 48 hours of heparin  infusion. Patient is  not a candidate for PCI.  She is not a good candidate for CABG.  Medical management advised. Remains on hydralazine  and nitrates.  Acute on chronic systolic and diastolic congestive heart failure: Severe ischemic cardiomyopathy. With previous history of diastolic heart failure.   Repeat echocardiogram with ejection fraction of 20 to 25%.  Grade 2 diastolic dysfunction.   Intermittently treated with Lasix , was on hold due to renal function abnormalities.  Creatinine fluctuates but now is stable.   4/25 , episode of shortness of breath improved with diuresis.   4/27 , respiratory distress due to pulmonary edema improved with BiPAP and IV Lasix .   Continue IV Lasix  today.  Continue Foley catheter.  Keep on oxygen to keep saturation more than 90%.  Keep BiPAP at bedside for shortness of breath, increased work of breathing. Poor prognosis.  Desire to continue aggressive management.  Type 2 diabetes: Well-controlled.  A1c 7.3.  On Lantus  and sliding scale.  Acceptable today.  AKI on CKD stage IIIa: Recent baseline creatinine of 1.23.  Creatinine gradually going up 1.76-2.25-2.5 2.7-2.6-2.2-2.29. Resuming diuretics.  Monitor.  Essential hypertension, stable Hyperlipidemia, stable Chronic iron  deficiency anemia, stable Bilateral amputee, long-term nursing home resident   Goal of care: Multiple goal of care discussions. Also see palliative care note 4/27 with patient and multiple family members. Patient has designated her niece [name] as spokesperson/healthcare power of attorney if she is not able to voice her concerns. Appropriate for palliation and hospice care, however is not accepting at this time. Goal is to stabilize her breathing and send her back to nursing home. Very  high risk of readmission.   Pressure Injury 09/07/23 Sacrum Right;Left;Mid Stage 1 -  Intact skin with non-blanchable redness of a localized area usually over a bony prominence. (Active)  09/07/23 0020  Location: Sacrum   Location Orientation: Right;Left;Mid  Staging: Stage 1 -  Intact skin with non-blanchable redness of a localized area usually over a bony prominence.  Wound Description (Comments):   Present on Admission: Yes        DVT prophylaxis:    Code Status: Full code Family Communication: None at the bedside.  Will call her niece. Disposition Plan: Status is: Inpatient Remains inpatient appropriate because: IV diuresis, monitoring renal functions.       Consultants:  Cardiology Palliative care  Procedures:  Cardiac cath  Antimicrobials:  None     Objective: Vitals:   09/13/23 2339 09/14/23 0517 09/14/23 0616 09/14/23 0748  BP: 129/75 135/73 135/73 (!) 123/59  Pulse: 81 77  78  Resp: 18 19  20   Temp: 97.8 F (36.6 C) 97.8 F (36.6 C)  (!) 97.5 F (36.4 C)  TempSrc: Oral Oral  Axillary  SpO2: 97% 99%  100%  Weight:  104.8 kg    Height:        Intake/Output Summary (Last 24 hours) at 09/14/2023 1014 Last data filed at 09/14/2023 0529 Gross per 24 hour  Intake 13 ml  Output 900 ml  Net -887 ml   Filed Weights   09/12/23 0515 09/13/23 0430 09/14/23 0517  Weight: 110.1 kg 109.3 kg 104.8 kg    Examination:  General: Chronically sick looking.  Very frail.  Looks fairly comfortable today on 3 L oxygen. Cardiovascular: S1-S2 normal.  Regular rate rhythm. Respiratory: Looks fairly comfortable.  On supplemental oxygen.  Difficult to auscultate due to obesity. Difficult to appreciate the JVD. No edema of the part of the legs. Gastrointestinal: Soft.  Nontender.  Obese and pendulous.  Foley catheter with clear urine. Ext: Difficult to assess edema, she has bilateral BKA. Neuro: Alert and awake.  Flat affect.      Data Reviewed: I have personally reviewed following labs and imaging studies  CBC: Recent Labs  Lab 09/07/23 1844 09/08/23 0325 09/09/23 0438 09/10/23 0415 09/13/23 0537  WBC  --  12.2* 13.3* 12.2* 16.8*  HGB 10.9* 10.0* 9.8* 9.5* 10.8*  HCT  32.0* 33.0* 31.8* 30.6* 34.9*  MCV  --  83.8 82.8 82.0 81.5  PLT  --  245 226 229 280   Basic Metabolic Panel: Recent Labs  Lab 09/09/23 0438 09/10/23 0415 09/11/23 0317 09/12/23 0359 09/13/23 0430 09/14/23 0500  NA 137 133* 134* 136 137 137  K 3.8 3.6 4.0 3.7 3.5 3.7  CL 97* 94* 94* 95* 98 96*  CO2 26 27 26 29 28 28   GLUCOSE 106* 92 126* 141* 122* 141*  BUN 35* 40* 41* 40* 38* 41*  CREATININE 2.25* 2.52* 2.70* 2.62* 2.21* 2.29*  CALCIUM  7.6* 7.4* 7.1* 7.1* 7.3* 7.2*  MG 2.2  --   --   --   --   --    GFR: Estimated Creatinine Clearance: 28.8 mL/min (A) (by C-G formula based on SCr of 2.29 mg/dL (H)). Liver Function Tests: Recent Labs  Lab 09/13/23 0537  AST 25  ALT 25  ALKPHOS 120  BILITOT 0.8  PROT 7.2  ALBUMIN  2.5*   No results for input(s): "LIPASE", "AMYLASE" in the last 168 hours. No results for input(s): "AMMONIA" in the last 168 hours. Coagulation Profile: No results for input(s): "INR", "  PROTIME" in the last 168 hours. Cardiac Enzymes: No results for input(s): "CKTOTAL", "CKMB", "CKMBINDEX", "TROPONINI" in the last 168 hours. BNP (last 3 results) No results for input(s): "PROBNP" in the last 8760 hours. HbA1C: No results for input(s): "HGBA1C" in the last 72 hours.  CBG: Recent Labs  Lab 09/13/23 0521 09/13/23 1200 09/13/23 1630 09/13/23 2107 09/14/23 0629  GLUCAP 127* 152* 153* 154* 132*   Lipid Profile: No results for input(s): "CHOL", "HDL", "LDLCALC", "TRIG", "CHOLHDL", "LDLDIRECT" in the last 72 hours.  Thyroid  Function Tests: No results for input(s): "TSH", "T4TOTAL", "FREET4", "T3FREE", "THYROIDAB" in the last 72 hours. Anemia Panel: No results for input(s): "VITAMINB12", "FOLATE", "FERRITIN", "TIBC", "IRON ", "RETICCTPCT" in the last 72 hours.  Sepsis Labs: No results for input(s): "PROCALCITON", "LATICACIDVEN" in the last 168 hours.   Recent Results (from the past 240 hours)  Resp panel by RT-PCR (RSV, Flu A&B, Covid) Anterior  Nasal Swab     Status: None   Collection Time: 09/06/23  2:05 PM   Specimen: Anterior Nasal Swab  Result Value Ref Range Status   SARS Coronavirus 2 by RT PCR NEGATIVE NEGATIVE Final   Influenza A by PCR NEGATIVE NEGATIVE Final   Influenza B by PCR NEGATIVE NEGATIVE Final    Comment: (NOTE) The Xpert Xpress SARS-CoV-2/FLU/RSV plus assay is intended as an aid in the diagnosis of influenza from Nasopharyngeal swab specimens and should not be used as a sole basis for treatment. Nasal washings and aspirates are unacceptable for Xpert Xpress SARS-CoV-2/FLU/RSV testing.  Fact Sheet for Patients: BloggerCourse.com  Fact Sheet for Healthcare Providers: SeriousBroker.it  This test is not yet approved or cleared by the [place]  FDA and has been authorized for detection and/or diagnosis of SARS-CoV-2 by FDA under an Emergency Use Authorization (EUA). This EUA will remain in effect (meaning this test can be used) for the duration of the COVID-19 declaration under Section 564(b)(1) of the Act, 21 U.S.C. section 360bbb-3(b)(1), unless the authorization is terminated or revoked.     Resp Syncytial Virus by PCR NEGATIVE NEGATIVE Final    Comment: (NOTE) Fact Sheet for Patients: BloggerCourse.com  Fact Sheet for Healthcare Providers: SeriousBroker.it  This test is not yet approved or cleared by the [place]  FDA and has been authorized for detection and/or diagnosis of SARS-CoV-2 by FDA under an Emergency Use Authorization (EUA). This EUA will remain in effect (meaning this test can be used) for the duration of the COVID-19 declaration under Section 564(b)(1) of the Act, 21 U.S.C. section 360bbb-3(b)(1), unless the authorization is terminated or revoked.  Performed at [facility], [address] [place], [place] [address]   MRSA Next Gen by PCR, Nasal     Status: None    Collection Time: 09/07/23  1:10 AM   Specimen: Nasal Mucosa; Nasal Swab  Result Value Ref Range Status   MRSA by PCR Next Gen NOT DETECTED NOT DETECTED Final    Comment: (NOTE) The GeneXpert MRSA Assay (FDA approved for NASAL specimens only), is one component of a comprehensive MRSA colonization surveillance program. It is not intended to diagnose MRSA infection nor to guide or monitor treatment for MRSA infections. Test performance is not FDA approved in patients less than 73 years old. Performed at [facility], [address], [place], [place] [address]   MRSA Next Gen by PCR, Nasal     Status: None   Collection Time: 09/07/23  7:32 PM   Specimen: Nasal Mucosa; Nasal Swab  Result  Value Ref Range Status   MRSA by PCR Next Gen NOT DETECTED NOT DETECTED Final    Comment: (NOTE) The GeneXpert MRSA Assay (FDA approved for NASAL specimens only), is one component of a comprehensive MRSA colonization surveillance program. It is not intended to diagnose MRSA infection nor to guide or monitor treatment for MRSA infections. Test performance is not FDA approved in patients less than 73 years old. Performed at [facility], [address] [place], [place] [address]          Radiology Studies: DG Chest Port 1 View Result Date: 09/13/2023 CLINICAL DATA:  Respiratory distress. EXAM: PORTABLE CHEST 1 VIEW COMPARISON:  09/11/2023 FINDINGS: There is a right arm PICC line with tip at the distal SVC. Stable cardiomediastinal contours. Aortic atherosclerosis. Pulmonary vascular congestion. Bilateral veil like opacification overlying the lower lung zones, right greater than left. Imaging findings are compatible with bilateral posterior scratch set imaging findings compatible with previous pleural effusions and overlying atelectasis/consolidation. Compared with the previous exam there is been interval worsening aeration to the right lower lung. IMPRESSION: 1. Interval worsening aeration to  the right lower lung. 2. Pulmonary vascular congestion. Electronically Signed   By: Kimberley Penman M.D.   On: 09/13/2023 06:04         Scheduled Meds:  aspirin   81 mg Oral Daily   atorvastatin   80 mg Oral Daily   Chlorhexidine  Gluconate Cloth  6 each Topical Daily   clopidogrel   75 mg Oral Daily   ferrous sulfate   325 mg Oral BID WC   furosemide   40 mg Intravenous BID   hydrALAZINE   25 mg Oral Q8H   insulin  aspart  0-15 Units Subcutaneous TID WC   insulin  glargine-yfgn  15 Units Subcutaneous BID   ipratropium-albuterol   3 mL Nebulization Once   isosorbide  dinitrate  10 mg Oral BID   metoprolol  succinate  25 mg Oral Daily   polyethylene glycol  17 g Oral Daily   senna-docusate  1 tablet Oral BID   sodium chloride  flush  10-40 mL Intracatheter Q12H   sodium chloride  flush  3 mL Intravenous Q12H   Continuous Infusions:     LOS: 8 days      Vada Garibaldi, MD [facility]

## 2023-09-14 NOTE — TOC Progression Note (Signed)
 Transition of Care [facility]) - Progression Note    Patient Details  Name: [name] MRN: [id number] Date of Birth: 1955/11/21  Transition of [facility]) CM/SW Contact  Arron Big, [place] Phone Number: 09/14/2023, 11:13 AM  Clinical Narrative:   Per daily meeting with treatment team, possible DC tomorrow for patient back to [facility].   TOC will continue to follow.      Expected Discharge Plan: Skilled Nursing Facility Barriers to Discharge: Continued Medical Work up  Expected Discharge Plan and Services In-house Referral: Clinical Social Work   Post Acute Care Choice: Skilled Nursing Facility Living arrangements for the past 2 months: Skilled Nursing Facility                                       Social Determinants of Health (SDOH) Interventions SDOH Screenings   Food Insecurity: No Food Insecurity (09/08/2023)  Housing: Unknown (09/08/2023)  Transportation Needs: No Transportation Needs (09/08/2023)  Utilities: Not At Risk (09/06/2023)  Social Connections: Unknown (09/06/2023)  Tobacco Use: Medium Risk (09/07/2023)    Readmission Risk Interventions     No data to display

## 2023-09-14 NOTE — Progress Notes (Signed)
   Palliative Medicine Inpatient Follow Up Note HPI: 68 y.o. female  with past medical history of chronic diastolic heart failure, type 2 diabetes on insulin , diabetic polyneuropathy, bilateral BKA and bed bound status, long-term nursing home resident, history of hypertension, hyperlipidemia, chronic iron -deficiency anemia  admitted on 09/06/2023 with shortness of breath from nursing home and found to have NSTEMI .    4/21 cardiac catheterization with extensive coronary artery disease not amenable to PCI and she is not a candidate for CABG.   Today's Discussion 09/14/2023  *Please note that this is a verbal dictation therefore any spelling or grammatical errors are due to the "Dragon Medical One" system interpretation.  Chart reviewed inclusive of vital signs, progress notes, laboratory results, and diagnostic images.   I met with [name] at bedside this morning after she had gotten cleaned up. She was awake and alert. She denies pain, shortness of breath, or nausea. [name] was eating her breakfast well during my time at bedside with her.   I met this afternoon with patients niece, [name]. We reviewed together advanced care planning documents. I shared the importance of [name] completing these. We reviewed them with one another briefly.   Created space and opportunity for patient to explore thoughts feelings and fears regarding current medical situation.  Questions and concerns addressed/Palliative Support Provided.   Objective Assessment: Vital Signs Vitals:   09/14/23 0748 09/14/23 1114  BP: (!) 123/59 (!) 131/59  Pulse: 78 77  Resp: 20 20  Temp: (!) 97.5 F (36.4 C) 97.8 F (36.6 C)  SpO2: 100% 100%    Intake/Output Summary (Last 24 hours) at 09/14/2023 1325 Last data filed at 09/14/2023 1251 Gross per 24 hour  Intake 133 ml  Output 900 ml  Net -767 ml   Last Weight  Most recent update: 09/14/2023  5:29 AM    Weight  104.8 kg (231 lb 0.7 oz)            Gen:  Elderly  AA F chronically ill appearing HEENT: moist mucous membranes CV: Regular rate and rhythm  PULM:  On 3LPM [place], breathing is even and nonlabored ABD: soft/nontender  EXT: (+) edema  Neuro: Alert and oriented x3   SUMMARY OF RECOMMENDATIONS   Full Code / Full Scope of care  Patient plans to complete AD's this afternoon -- Appreciate Chaplain involvement  Ongoing PMT support  Billing based on MDM: High in the setting of review/completion of AD documents ______________________________________________________________________________________ [facility] [place] Palliative Medicine Team Team Cell Phone: [phone] Please utilize secure chat with additional questions, if there is no response within 30 minutes please call the above phone number  Palliative Medicine Team providers are available by phone from 7am to 7pm daily and can be reached through the team cell phone.  Should this patient require assistance outside of these hours, please call the patient's attending physician.

## 2023-09-14 NOTE — Plan of Care (Signed)
  Problem: Education: Goal: Ability to describe self-care measures that may prevent or decrease complications (Diabetes Survival Skills Education) will improve Outcome: Not Progressing Goal: Individualized Educational Video(s) Outcome: Not Progressing   Problem: Coping: Goal: Ability to adjust to condition or change in health will improve Outcome: Not Progressing   Problem: Fluid Volume: Goal: Ability to maintain a balanced intake and output will improve Outcome: Progressing   Problem: Health Behavior/Discharge Planning: Goal: Ability to identify and utilize available resources and services will improve Outcome: Not Progressing Goal: Ability to manage health-related needs will improve Outcome: Not Progressing   Problem: Nutritional: Goal: Maintenance of adequate nutrition will improve Outcome: Progressing Goal: Progress toward achieving an optimal weight will improve Outcome: Not Progressing   Problem: Health Behavior/Discharge Planning: Goal: Ability to identify and utilize available resources and services will improve Outcome: Not Progressing Goal: Ability to manage health-related needs will improve Outcome: Not Progressing   Problem: Metabolic: Goal: Ability to maintain appropriate glucose levels will improve Outcome: Progressing   Problem: Skin Integrity: Goal: Risk for impaired skin integrity will decrease Outcome: Progressing   Problem: Education: Goal: Knowledge of General Education information will improve Description: Including pain rating scale, medication(s)/side effects and non-pharmacologic comfort measures Outcome: Not Progressing   Problem: Health Behavior/Discharge Planning: Goal: Ability to manage health-related needs will improve Outcome: Not Progressing   Problem: Clinical Measurements: Goal: Ability to maintain clinical measurements within normal limits will improve Outcome: Not Progressing Goal: Will remain free from infection Outcome:  Progressing Goal: Diagnostic test results will improve Outcome: Not Progressing Goal: Respiratory complications will improve Outcome: Not Progressing Goal: Cardiovascular complication will be avoided Outcome: Not Progressing   Problem: Coping: Goal: Level of anxiety will decrease Outcome: Not Progressing   Problem: Nutrition: Goal: Adequate nutrition will be maintained Outcome: Progressing   Problem: Activity: Goal: Capacity to carry out activities will improve Outcome: Not Progressing   Problem: Education: Goal: Understanding of CV disease, CV risk reduction, and recovery process will improve Outcome: Progressing Goal: Individualized Educational Video(s) Outcome: Not Progressing

## 2023-09-14 NOTE — Progress Notes (Signed)
   09/14/23 1415  Spiritual Encounters  Type of Visit Follow up  Care provided to: Patient  Referral source Clinical staff  Reason for visit Advance directives   Chaplain went to Pt room to look over Pt's AD paperwork and see if it was ready for notarization.  Paperwork was all good and ready.  Obtained notary, but Volunteer services advised that there are no volunteers available for witnessing this afternoon.  Chaplain informed Pt of the problem and advised that another attempt will be made first thing in the morning.  Chaplain services remain available by Spiritual Consult or for emergent cases, paging [phone]  Chaplain Dewitte Foreman, MDiv [email or web address] [phone]

## 2023-09-15 DIAGNOSIS — I214 Non-ST elevation (NSTEMI) myocardial infarction: Secondary | ICD-10-CM | POA: Diagnosis not present

## 2023-09-15 LAB — BASIC METABOLIC PANEL WITH GFR
Anion gap: 12 (ref 5–15)
BUN: 44 mg/dL — ABNORMAL HIGH (ref 8–23)
CO2: 29 mmol/L (ref 22–32)
Calcium: 6.9 mg/dL — ABNORMAL LOW (ref 8.9–10.3)
Chloride: 95 mmol/L — ABNORMAL LOW (ref 98–111)
Creatinine, Ser: 2.1 mg/dL — ABNORMAL HIGH (ref 0.44–1.00)
GFR, Estimated: 25 mL/min — ABNORMAL LOW (ref >60)
Glucose, Bld: 180 mg/dL — ABNORMAL HIGH (ref 70–99)
Potassium: 3.4 mmol/L — ABNORMAL LOW (ref 3.5–5.1)
Sodium: 136 mmol/L (ref 135–145)

## 2023-09-15 LAB — GLUCOSE, CAPILLARY
Glucose-Capillary: 114 mg/dL — ABNORMAL HIGH (ref 70–99)
Glucose-Capillary: 97 mg/dL (ref 70–99)
Glucose-Capillary: 167 mg/dL — ABNORMAL HIGH (ref 70–99)
Glucose-Capillary: 198 mg/dL — ABNORMAL HIGH (ref 70–99)
Glucose-Capillary: 148 mg/dL — ABNORMAL HIGH (ref 70–99)

## 2023-09-15 MED ORDER — ISOSORBIDE DINITRATE 10 MG PO TABS
20.0000 mg | ORAL_TABLET | Freq: Two times a day (BID) | ORAL | Status: DC
  Administered 2023-09-15 – 2023-09-16 (×2): 20 mg via ORAL
  Filled 2023-09-15 (×2): qty 2

## 2023-09-15 MED ORDER — ENSURE ENLIVE PO LIQD
237.0000 mL | Freq: Two times a day (BID) | ORAL | Status: DC
  Administered 2023-09-15 – 2023-09-16 (×2): 237 mL via ORAL

## 2023-09-15 MED ORDER — POTASSIUM CHLORIDE CRYS ER 20 MEQ PO TBCR
40.0000 meq | EXTENDED_RELEASE_TABLET | Freq: Once | ORAL | Status: AC
  Administered 2023-09-15: 40 meq via ORAL
  Filled 2023-09-15: qty 2

## 2023-09-15 MED ORDER — FUROSEMIDE 40 MG PO TABS
80.0000 mg | ORAL_TABLET | Freq: Every day | ORAL | Status: DC
  Administered 2023-09-15 – 2023-09-16 (×2): 80 mg via ORAL
  Filled 2023-09-15 (×2): qty 2

## 2023-09-15 NOTE — Progress Notes (Addendum)
 Progress Note  Patient Name: [name] Date of Encounter: 09/15/2023  Primary Cardiologist: None   Subjective   She is without complaint. Denies CP, dyspnea, pressure, palpitations, and certainly improved from acute dyspnea per flash pulmonary edema this weekend. She would like to leave the hospital soon.  Actually smiling with me today.  She definitely feels better.  She tells me at home she actually has bilateral prostheses and uses a walker.  She does some work as a Scientist, physiological.  Inpatient Medications    Scheduled Meds:  aspirin   81 mg Oral Daily   atorvastatin   80 mg Oral Daily   Chlorhexidine  Gluconate Cloth  6 each Topical Daily   clopidogrel   75 mg Oral Daily   ferrous sulfate   325 mg Oral BID WC   hydrALAZINE   25 mg Oral Q8H   insulin  aspart  0-15 Units Subcutaneous TID WC   insulin  glargine-yfgn  15 Units Subcutaneous BID   ipratropium-albuterol   3 mL Nebulization Once   isosorbide  dinitrate  10 mg Oral BID   metoprolol  succinate  25 mg Oral Daily   polyethylene glycol  17 g Oral Daily   senna-docusate  1 tablet Oral BID   sodium chloride  flush  10-40 mL Intracatheter Q12H   sodium chloride  flush  3 mL Intravenous Q12H   Continuous Infusions:  PRN Meds: acetaminophen  **OR** acetaminophen , ipratropium-albuterol , labetalol , melatonin, ondansetron  (ZOFRAN ) IV, mouth rinse, sodium chloride  flush, sodium chloride  flush   Vital Signs    Vitals:   09/15/23 0001 09/15/23 0015 09/15/23 0458 09/15/23 0504  BP:  102/71 116/63 116/63  Pulse:  75  76  Resp:  20 20 20   Temp:  97.6 F (36.4 C) 97.9 F (36.6 C)   TempSrc:  Oral Oral   SpO2: 100% 100% 98% 99%  Weight:    106.4 kg  Height:        Intake/Output Summary (Last 24 hours) at 09/15/2023 0746 Last data filed at 09/15/2023 0509 Gross per 24 hour  Intake 580 ml  Output 400 ml  Net 180 ml   Filed Weights   09/13/23 0430 09/14/23 0517 09/15/23 0504  Weight: 109.3 kg 104.8 kg 106.4 kg    Telemetry     NSR - Personally Reviewed   Physical Exam   GEN: Chronically ill-appearing, morbidly obese. No acute distress. Wearing oxygen [place] 3L. Neck: Unable to assess for JVD due to body habitus Cardiac: RRR, distant heart sounds, no murmurs, rubs, or gallops.  Respiratory: Trace rales bilaterally, small improvement since yesterday. => Difficult exam, but has improved.  Less work of breathing and appears comfortable. GI: Soft, nontender, non-distended  MS: No edema, bilateral BKAs. Neuro:  Nonfocal  Psych: Normal affect   Labs    Chemistry Recent Labs  Lab 09/12/23 0359 09/13/23 0430 09/13/23 0537 09/14/23 0500  NA 136 137  --  137  K 3.7 3.5  --  3.7  CL 95* 98  --  96*  CO2 29 28  --  28  GLUCOSE 141* 122*  --  141*  BUN 40* 38*  --  41*  CREATININE 2.62* 2.21*  --  2.29*  CALCIUM  7.1* 7.3*  --  7.2*  PROT  --   --  7.2  --   ALBUMIN   --   --  2.5*  --   AST  --   --  25  --   ALT  --   --  25  --   ALKPHOS  --   --  120  --   BILITOT  --   --  0.8  --   GFRNONAA 19* 24*  --  23*  ANIONGAP 12 11  --  13     Hematology Recent Labs  Lab 09/09/23 0438 09/09/23 1101 09/10/23 0415 09/13/23 0537  WBC 13.3*  --  12.2* 16.8*  RBC 3.84* 3.94 3.73* 4.28  HGB 9.8*  --  9.5* 10.8*  HCT 31.8*  --  30.6* 34.9*  MCV 82.8  --  82.0 81.5  MCH 25.5*  --  25.5* 25.2*  MCHC 30.8  --  31.0 30.9  RDW 19.1*  --  18.4* 18.6*  PLT 226  --  229 280    Cardiac EnzymesNo results for input(s): "TROPONINI" in the last 168 hours. No results for input(s): "TROPIPOC" in the last 168 hours.   BNP Recent Labs  Lab 09/13/23 0537  BNP 789.9*     DDimer  Recent Labs  Lab 09/13/23 0537  DDIMER 2.07*     Radiology    No results found.  Cardiac Studies   ECHO 4/21 with EF 20-25%, global hypokinesis of LV, concentric LV hypertrophy, mild reduced RV systolic function, moderate tricuspid regurgitation. R&LHC 4/21:  Severe MV CAD - Ost RCA 90%-50% prox RCA & 100% CTO  mRCA-bifurcation; ost-prox LAD ~30% with 80% heavily calcified focal lesion at D1 and 40% mid LAD.  (Collaterals from septals fill PDA); RI (same caliber as LAD) has 90% proximal stenosis; tiny caliber LCx with 1 tiny OM that bifurcates into OM 2 with sidebranch and LPL 1-very small caliber and not a PCI target. Severe PAH: Significant portion WHO Class II related to Acute on Chronic Combined HF PAP-mean 54/42-44 mmHg; PCWP 34 mmHg AO sat 100%, PA sat 53%.  Cardiac Output/Index by Albertina Hugger 4.09-1.86  Dominance: Right   Patient Profile     68 y.o. female with history of nonobstructive CAD, HF with recovered EF, insulin  dependent DM II, bilateral AKA, CVA, CKD IIIa. Admitted with NSTEMI and acute respiratory failure 2/2 CHF.   Assessment & Plan    Principal Problem:   NSTEMI (non-ST elevated myocardial infarction) (HCC) Active Problems:   Acute on chronic combined systolic and diastolic CHF (congestive heart failure) (HCC)   Ischemic cardiomyopathy   Acute renal failure superimposed on stage 3a chronic kidney disease (HCC)   HLD (hyperlipidemia)   Essential hypertension   S/P BKA (below knee amputation) bilateral (HCC)   Leukocytosis   Chronic diastolic CHF (congestive heart failure) (HCC)   DM2 (diabetes mellitus, type 2) (HCC)   Chronic iron  deficiency anemia   Pressure injury of skin     NSTEMI with multivessel disease and ischemic cardiomyopathy Troponin > 24k at admission last week. LHC with mulitivessal CAD but not a CABG candidate and poorly amenable to PCI given comorbidities and AKI. Continue medical management.  - DAPT aspirin  and plavix  and high intensity atorvastatin  80 every day -With essentially three-vessel disease, occluded RCA and LAD/RI disease, will titrate up the nitrate portion of hydralazine -nitrate (increased Isordil  to 20 mg twice daily)   Acute on Chronic combined systolic/diastolic HF with EF 16-10% Flash Pulmonary Edema In past recovered EF but Echo 4/21  demonstarted EF 20-25%. Diuretics held last week per renal function, resumed Saturday from flash pulmonary edema. Volume exam challenging given habitus.  - Urine output poor yesterday. Awaiting today's BMP but noted Cr trending up, suspecting she is approaching euvolemia. - Hold IV lasix . Will await this morning's labs and consider restarting  home diuretic, lasix  80 every day.  - Continue toprol  XL 25 daily, consider titration as renal function, volume status stabilizes. Will also consider restarting aldactone  and GLP-1 per improvement for GDMT. Limited baseline functionality makes her a poor candidate for an SGLT2i. - Hydralazine  25 mg 3 times daily for afterload reduction, with Isordil  10 mg twice daily-can titrate up as BP will tolerate - wean O2 as tolerated   PVCs On metoprolol , burden improved   Acute on chronic kidney failure (baseline CKD 3A) Creatinine stable above 2, baseline 1.2, will monitor with ongoing diuresis. Will need to see what her final creatinine level will stabilize out at as her new baseline.    Will continue to follow.       Signed, Carleen Chary, DO, PGY-1 09/15/2023, 7:46 AM    ATTENDING ATTESTATION  I have seen, examined and evaluated the patient this morning on rounds along with the resident physician-Christopher Juberg, DO, PGY-1.  After reviewing all the available data and chart, we discussed the patients laboratory, study & physical findings as well as symptoms in detail.  I agree with his findings, examination as well as impression recommendations as per our discussion.    Attending adjustments noted in italics.   Overall seems to be stabilizing now.  Creatinine level seems to have plateaued as has urine output with IV Lasix .  I think it is reasonable to convert to p.o. Lasix  (she was on 40 mg IV twice daily here will consolidate 80 mg p.o.daily-will need to discuss sliding scale dosing to take an additional dose with weight gain more than 3 pounds  in 1 day or 5 pounds in 1 week.)  Titrating up nitrate to allow for some additional antianginal effect.  Again, with her likely anginal COVID symptoms being CHF related, where she did have some further issues in the future, may want to consider the risk versus benefits of LAD/RI intervention.  Understand the risks of further worsening renal function versus improving cardiac symptoms.  Would like to allow her time to have some normalization of her renal function before this is addressed.  Best to be discussed in the outpatient setting.    Arleen Lacer, MD, MS Randene Bustard, M.D., M.S. Interventional Cardiologist  [facility]  Pager # [phone] Phone # [phone] [address] [place], [place] [address]     For questions or updates, please contact [facility] Please consult [email or web address] for contact info under Cardiology/STEMI.

## 2023-09-15 NOTE — Progress Notes (Signed)
 PROGRESS NOTE    [name]  WFU:[id number] DOB: 02/08/1956 DOA: 09/06/2023 PCP: [name] Sit, MD    Brief Narrative:  Patient is 68 year old female with history of chronic diastolic heart failure, type 2 diabetes on insulin , diabetic polyneuropathy, bilateral BKA and bed bound status  long-term nursing home resident, history of hypertension, hyperlipidemia, chronic iron -deficiency anemia presented to the hospital with shortness of breath from nursing home and found to have non-STEMI.  Routine vital signs at the nursing home revealed low oxygen saturations at 80% on room air.  This prompted her to send to ER.  Patient without particular symptoms but with vague symptoms. In the emergency room on 2 L oxygen.  Troponins [id number].  Started on heparin  infusion, admitted to hospital with cardiology consultation. 4/21, cardiac catheterization with extensive coronary artery disease not amenable to PCI and she is not a candidate for CABG. Also diuresed and followed by heart failure team. Remains in the hospital.  Still gets episodic flash pulmonary edema and shortness of breath.  Subjective:  Patient seen and examined.  As usual.  Denies any complaints.  She tells me her breathing is fine.  No shortness of breath episodes since at least last 24 hours.  Morning labs pending.  Creatinine yesterday 2.29.  Able to void after Foley catheter was taken off.  900 mL urine output.  No family at the bedside.   Assessment & Plan:   Non-STEMI: Presented with shortness of breath.  Acute CHF. Cardiac catheterization with severe multivessel CAD. Remains on DAPT and high intensity statin.  Completed 48 hours of heparin  infusion. Patient is not a candidate for PCI.  She is not a good candidate for CABG.  Medical management advised. Remains on hydralazine  and nitrates.  Acute on chronic systolic and diastolic congestive heart failure: Severe ischemic cardiomyopathy. With previous history of  diastolic heart failure.   Repeat echocardiogram with ejection fraction of 20 to 25%.  Grade 2 diastolic dysfunction.   Intermittently treated with Lasix , was on hold due to renal function abnormalities.  Creatinine fluctuates but now is stable.   4/25 , episode of shortness of breath improved with diuresis.   4/27 , respiratory distress due to pulmonary edema improved with BiPAP and IV Lasix .   Was back on IV Lasix .  However, renal functions worsening.  Lasix  holiday today. Keep BiPAP at bedside for shortness of breath, increased work of breathing. Poor prognosis.  Desires to continue aggressive management.  Type 2 diabetes: Well-controlled.  A1c 7.3.  On Lantus  and sliding scale.  Acceptable today.  AKI on CKD stage IIIa: Recent baseline creatinine of 1.23.  Creatinine gradually going up 1.76-2.25-2.5 2.7-2.6-2.2-2.29. Resuming diuretics.  Monitor.  Essential hypertension, stable Hyperlipidemia, stable Chronic iron  deficiency anemia, stable Bilateral amputee, long-term nursing home resident   Goal of care: Multiple goal of care discussions. Also see palliative care note 4/27 with patient and multiple family members. Patient has designated her niece [name] as spokesperson/healthcare power of attorney if she is not able to voice her concerns. Appropriate for palliation and hospice care, however is not accepting at this time. Goal is to stabilize her breathing and send her back to nursing home. Very high risk of readmission.   Pressure Injury 09/07/23 Sacrum Right;Left;Mid Stage 1 -  Intact skin with non-blanchable redness of a localized area usually over a bony prominence. (Active)  09/07/23 0020  Location: Sacrum  Location Orientation: Right;Left;Mid  Staging: Stage 1 -  Intact skin with non-blanchable redness of  a localized area usually over a bony prominence.  Wound Description (Comments):   Present on Admission: Yes        DVT prophylaxis:    Code Status: Full  code Family Communication: None at the bedside.   Disposition Plan: Status is: Inpatient Remains inpatient appropriate because: IV diuresis, monitoring renal functions.   Patient is medically stabilizing today.  She will develop fluid overload and respiratory stress when unable to get diuretics.   Patient is from long-term nursing home.  She will be able to be discharged when able to maintain on oral diuretic therapy and not getting episodic flareups.     Consultants:  Cardiology Palliative care  Procedures:  Cardiac cath  Antimicrobials:  None     Objective: Vitals:   09/15/23 0015 09/15/23 0458 09/15/23 0504 09/15/23 0815  BP: 102/71 116/63 116/63 (!) 109/47  Pulse: 75  76 83  Resp: 20 20 20 20   Temp: 97.6 F (36.4 C) 97.9 F (36.6 C)  97.7 F (36.5 C)  TempSrc: Oral Oral  Oral  SpO2: 100% 98% 99% 98%  Weight:   106.4 kg   Height:        Intake/Output Summary (Last 24 hours) at 09/15/2023 1056 Last data filed at 09/15/2023 1610 Gross per 24 hour  Intake 710 ml  Output 400 ml  Net 310 ml   Filed Weights   09/13/23 0430 09/14/23 0517 09/15/23 0504  Weight: 109.3 kg 104.8 kg 106.4 kg    Examination:  General: Chronically sick looking.  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 Cardiovascular: S1-S2 normal.  Regular rate rhythm. Respiratory: Looks fairly comfortable.  On supplemental oxygen.  Difficult to auscultate due to obesity. Difficult to appreciate the JVD. No edema of the part of the legs. Gastrointestinal: Soft.  Nontender.  Obese and pendulous. Ext: Difficult to assess edema, she has bilateral BKA. Neuro: Alert and awake.  Flat affect.      Data Reviewed: I have personally reviewed following labs and imaging studies  CBC: Recent Labs  Lab 09/09/23 0438 09/10/23 0415 09/13/23 0537  WBC 13.3* 12.2* 16.8*  HGB 9.8* 9.5* 10.8*  HCT 31.8* 30.6* 34.9*  MCV 82.8 82.0 81.5  PLT 226 229 280   Basic Metabolic Panel: Recent Labs  Lab 09/09/23 0438  09/10/23 0415 09/11/23 0317 09/12/23 0359 09/13/23 0430 09/14/23 0500  NA 137 133* 134* 136 137 137  K 3.8 3.6 4.0 3.7 3.5 3.7  CL 97* 94* 94* 95* 98 96*  CO2 26 27 26 29 28 28   GLUCOSE 106* 92 126* 141* 122* 141*  BUN 35* 40* 41* 40* 38* 41*  CREATININE 2.25* 2.52* 2.70* 2.62* 2.21* 2.29*  CALCIUM  7.6* 7.4* 7.1* 7.1* 7.3* 7.2*  MG 2.2  --   --   --   --   --    GFR: Estimated Creatinine Clearance: 29 mL/min (A) (by C-G formula based on SCr of 2.29 mg/dL (H)). Liver Function Tests: Recent Labs  Lab 09/13/23 0537  AST 25  ALT 25  ALKPHOS 120  BILITOT 0.8  PROT 7.2  ALBUMIN  2.5*   No results for input(s): "LIPASE", "AMYLASE" in the last 168 hours. No results for input(s): "AMMONIA" in the last 168 hours. Coagulation Profile: No results for input(s): "INR", "PROTIME" in the last 168 hours. Cardiac Enzymes: No results for input(s): "CKTOTAL", "CKMB", "CKMBINDEX", "TROPONINI" in the last 168 hours. BNP (last 3 results) No results for input(s): "PROBNP" in the last 8760 hours. HbA1C: No results for input(s): "HGBA1C"  in the last 72 hours.  CBG: Recent Labs  Lab 09/14/23 1618 09/14/23 2055 09/14/23 2318 09/15/23 0549 09/15/23 0623  GLUCAP 141* 152* 139* 97 114*   Lipid Profile: No results for input(s): "CHOL", "HDL", "LDLCALC", "TRIG", "CHOLHDL", "LDLDIRECT" in the last 72 hours.  Thyroid  Function Tests: No results for input(s): "TSH", "T4TOTAL", "FREET4", "T3FREE", "THYROIDAB" in the last 72 hours. Anemia Panel: No results for input(s): "VITAMINB12", "FOLATE", "FERRITIN", "TIBC", "IRON ", "RETICCTPCT" in the last 72 hours.  Sepsis Labs: No results for input(s): "PROCALCITON", "LATICACIDVEN" in the last 168 hours.   Recent Results (from the past 240 hours)  Resp panel by RT-PCR (RSV, Flu A&B, Covid) Anterior Nasal Swab     Status: None   Collection Time: 09/06/23  2:05 PM   Specimen: Anterior Nasal Swab  Result Value Ref Range Status   SARS Coronavirus 2 by  RT PCR NEGATIVE NEGATIVE Final   Influenza A by PCR NEGATIVE NEGATIVE Final   Influenza B by PCR NEGATIVE NEGATIVE Final    Comment: (NOTE) The Xpert Xpress SARS-CoV-2/FLU/RSV plus assay is intended as an aid in the diagnosis of influenza from Nasopharyngeal swab specimens and should not be used as a sole basis for treatment. Nasal washings and aspirates are unacceptable for Xpert Xpress SARS-CoV-2/FLU/RSV testing.  Fact Sheet for Patients: BloggerCourse.com  Fact Sheet for Healthcare Providers: SeriousBroker.it  This test is not yet approved or cleared by the [place]  FDA and has been authorized for detection and/or diagnosis of SARS-CoV-2 by FDA under an Emergency Use Authorization (EUA). This EUA will remain in effect (meaning this test can be used) for the duration of the COVID-19 declaration under Section 564(b)(1) of the Act, 21 U.S.C. section 360bbb-3(b)(1), unless the authorization is terminated or revoked.     Resp Syncytial Virus by PCR NEGATIVE NEGATIVE Final    Comment: (NOTE) Fact Sheet for Patients: BloggerCourse.com  Fact Sheet for Healthcare Providers: SeriousBroker.it  This test is not yet approved or cleared by the [place]  FDA and has been authorized for detection and/or diagnosis of SARS-CoV-2 by FDA under an Emergency Use Authorization (EUA). This EUA will remain in effect (meaning this test can be used) for the duration of the COVID-19 declaration under Section 564(b)(1) of the Act, 21 U.S.C. section 360bbb-3(b)(1), unless the authorization is terminated or revoked.  Performed at [facility], [address], [place], [place] [address]   MRSA Next Gen by PCR, Nasal     Status: None   Collection Time: 09/07/23  1:10 AM   Specimen: Nasal Mucosa; Nasal Swab  Result Value Ref Range Status   MRSA by PCR Next Gen NOT DETECTED NOT DETECTED  Final    Comment: (NOTE) The GeneXpert MRSA Assay (FDA approved for NASAL specimens only), is one component of a comprehensive MRSA colonization surveillance program. It is not intended to diagnose MRSA infection nor to guide or monitor treatment for MRSA infections. Test performance is not FDA approved in patients less than 74 years old. Performed at [facility], [address] [place], [place] [address]   MRSA Next Gen by PCR, Nasal     Status: None   Collection Time: 09/07/23  7:32 PM   Specimen: Nasal Mucosa; Nasal Swab  Result Value Ref Range Status   MRSA by PCR Next Gen NOT DETECTED NOT DETECTED Final    Comment: (NOTE) The GeneXpert MRSA Assay (FDA approved for NASAL specimens only), is one component of a comprehensive MRSA colonization surveillance program.  It is not intended to diagnose MRSA infection nor to guide or monitor treatment for MRSA infections. Test performance is not FDA approved in patients less than 75 years old. Performed at [facility], [address] [place], [place] [address]          Radiology Studies: No results found.        Scheduled Meds:  aspirin   81 mg Oral Daily   atorvastatin   80 mg Oral Daily   Chlorhexidine  Gluconate Cloth  6 each Topical Daily   clopidogrel   75 mg Oral Daily   feeding supplement  237 mL Oral BID BM   ferrous sulfate   325 mg Oral BID WC   hydrALAZINE   25 mg Oral Q8H   insulin  aspart  0-15 Units Subcutaneous TID WC   insulin  glargine-yfgn  15 Units Subcutaneous BID   ipratropium-albuterol   3 mL Nebulization Once   isosorbide  dinitrate  10 mg Oral BID   metoprolol  succinate  25 mg Oral Daily   polyethylene glycol  17 g Oral Daily   senna-docusate  1 tablet Oral BID   sodium chloride  flush  10-40 mL Intracatheter Q12H   sodium chloride  flush  3 mL Intravenous Q12H   Continuous Infusions:     LOS: 9 days      Vada Garibaldi, MD [facility]

## 2023-09-15 NOTE — Plan of Care (Signed)

## 2023-09-15 NOTE — Progress Notes (Signed)
   09/15/23 1500  Spiritual Encounters  Type of Visit Follow up  Care provided to: Patient  Conversation partners present during encounter Nurse  Reason for visit Advance directives   Chaplain obtained Notary and witnesses and facilitated notarization of the Pt's Advance Care Directive. Chaplain scanned and emailed copy of completed ACD to [email or web address], returned the original to the Pt along with 2 copies for her healthcare proxies, and place a copy in the Pt's paper chart.  No further needs at this time.  Chaplain services remain available by Spiritual Consult or for emergent cases, paging [phone]  Chaplain Dewitte Foreman, MDiv [email or web address] [phone]

## 2023-09-16 DIAGNOSIS — N171 Acute kidney failure with acute cortical necrosis: Secondary | ICD-10-CM

## 2023-09-16 DIAGNOSIS — L89311 Pressure ulcer of right buttock, stage 1: Secondary | ICD-10-CM

## 2023-09-16 DIAGNOSIS — I5023 Acute on chronic systolic (congestive) heart failure: Secondary | ICD-10-CM | POA: Diagnosis not present

## 2023-09-16 DIAGNOSIS — Z7189 Other specified counseling: Secondary | ICD-10-CM | POA: Diagnosis not present

## 2023-09-16 DIAGNOSIS — Z515 Encounter for palliative care: Secondary | ICD-10-CM | POA: Diagnosis not present

## 2023-09-16 DIAGNOSIS — Z89512 Acquired absence of left leg below knee: Secondary | ICD-10-CM

## 2023-09-16 DIAGNOSIS — I214 Non-ST elevation (NSTEMI) myocardial infarction: Secondary | ICD-10-CM | POA: Diagnosis not present

## 2023-09-16 DIAGNOSIS — E785 Hyperlipidemia, unspecified: Secondary | ICD-10-CM

## 2023-09-16 DIAGNOSIS — Z89511 Acquired absence of right leg below knee: Secondary | ICD-10-CM

## 2023-09-16 DIAGNOSIS — E1169 Type 2 diabetes mellitus with other specified complication: Secondary | ICD-10-CM

## 2023-09-16 DIAGNOSIS — D509 Iron deficiency anemia, unspecified: Secondary | ICD-10-CM | POA: Diagnosis not present

## 2023-09-16 LAB — BASIC METABOLIC PANEL WITH GFR
Anion gap: 10 (ref 5–15)
BUN: 44 mg/dL — ABNORMAL HIGH (ref 8–23)
CO2: 29 mmol/L (ref 22–32)
Calcium: 7 mg/dL — ABNORMAL LOW (ref 8.9–10.3)
Chloride: 97 mmol/L — ABNORMAL LOW (ref 98–111)
Creatinine, Ser: 1.94 mg/dL — ABNORMAL HIGH (ref 0.44–1.00)
GFR, Estimated: 28 mL/min — ABNORMAL LOW (ref >60)
Glucose, Bld: 198 mg/dL — ABNORMAL HIGH (ref 70–99)
Potassium: 3.8 mmol/L (ref 3.5–5.1)
Sodium: 136 mmol/L (ref 135–145)

## 2023-09-16 LAB — GLUCOSE, CAPILLARY
Glucose-Capillary: 119 mg/dL — ABNORMAL HIGH (ref 70–99)
Glucose-Capillary: 234 mg/dL — ABNORMAL HIGH (ref 70–99)
Glucose-Capillary: 183 mg/dL — ABNORMAL HIGH (ref 70–99)

## 2023-09-16 MED ORDER — HYDRALAZINE HCL 25 MG PO TABS: 25.0000 mg | ORAL_TABLET | Freq: Three times a day (TID) | ORAL | 0 refills | Status: DC

## 2023-09-16 MED ORDER — LANTUS 100 UNIT/ML ~~LOC~~ SOLN: 15.0000 [IU] | Freq: Two times a day (BID) | SUBCUTANEOUS | Status: DC

## 2023-09-16 MED ORDER — ISOSORBIDE DINITRATE 20 MG PO TABS
20.0000 mg | ORAL_TABLET | Freq: Two times a day (BID) | ORAL | 0 refills | Status: DC
Start: 2023-09-16 — End: 2023-11-28

## 2023-09-16 MED ORDER — POTASSIUM CHLORIDE CRYS ER 20 MEQ PO TBCR
40.0000 meq | EXTENDED_RELEASE_TABLET | Freq: Once | ORAL | Status: AC
  Administered 2023-09-16: 40 meq via ORAL
  Filled 2023-09-16 (×2): qty 2

## 2023-09-16 MED ORDER — FUROSEMIDE 80 MG PO TABS: 80.0000 mg | ORAL_TABLET | Freq: Every day | ORAL | 0 refills | Status: DC

## 2023-09-16 MED ORDER — CLOPIDOGREL BISULFATE 75 MG PO TABS: 75.0000 mg | ORAL_TABLET | Freq: Every day | ORAL | 0 refills | Status: DC

## 2023-09-16 MED ORDER — METOPROLOL SUCCINATE ER 25 MG PO TB24: 25.0000 mg | ORAL_TABLET | Freq: Every day | ORAL | 0 refills | Status: DC

## 2023-09-16 MED ORDER — ATORVASTATIN CALCIUM 80 MG PO TABS
80.0000 mg | ORAL_TABLET | Freq: Every day | ORAL | 0 refills | Status: DC
Start: 2023-09-17 — End: 2023-11-28

## 2023-09-16 NOTE — Assessment & Plan Note (Signed)
 Cell count has been stable, continue iron  supplementation

## 2023-09-16 NOTE — Assessment & Plan Note (Signed)
 04/21 cardiac catheterization with severe multivessel disease with no clear culprit lesion.  Recommendation to continue dual antiplatelet therapy with aspirin  and clopidogrel  for a minimum of 12 months.  Not candidate for CABG.

## 2023-09-16 NOTE — TOC Progression Note (Signed)
 Transition of Care [facility]) - Progression Note    Patient Details  Name: [name] MRN: [id number] Date of Birth: 18-Aug-1955  Transition of Care [facility] CM/SW Contact  Arron Big, [place] Phone Number: 09/16/2023, 11:12 AM  Clinical Narrative:   Per [facility] Edelman, they can accept patient back today.   TOC will continue to follow.    Expected Discharge Plan: Skilled Nursing Facility Barriers to Discharge: Continued Medical Work up  Expected Discharge Plan and Services In-house Referral: Clinical Social Work   Post Acute Care Choice: Skilled Nursing Facility Living arrangements for the past 2 months: Skilled Nursing Facility                                       Social Determinants of Health (SDOH) Interventions SDOH Screenings   Food Insecurity: No Food Insecurity (09/08/2023)  Housing: Unknown (09/08/2023)  Transportation Needs: No Transportation Needs (09/08/2023)  Utilities: Not At Risk (09/06/2023)  Social Connections: Unknown (09/06/2023)  Tobacco Use: Medium Risk (09/07/2023)    Readmission Risk Interventions     No data to display

## 2023-09-16 NOTE — Discharge Summary (Signed)
 Physician Discharge Summary   Patient: [name] MRN: [id number] DOB: 10/06/1955  Admit date:     09/06/2023  Discharge date: 09/16/23  Discharge Physician: Curlee Doss Candela Krul   PCP: Arnaez Zapata, Gerardo E, MD   Recommendations at discharge:    Patient will continue dual antiplatelet therapy with aspirin  and clopidogrel  for a minimum of 12 months Decreased metoprolol  to 25 mg and added hydralazine  and isosorbide  for afterload reduction. Discontinue amlodipine ,  Limited guideline medical therapy due to acutely reduced GFR.  Diuresis with furosemide  80 mg po daily  Reduced dose of basal insulin , further adjustments as outpatient.  Follow up renal function and electrolytes in 7 days as outpatient Follow up with Dr Terrye Fiedler in 7 to 10 days Follow up with Cardiology as scheduled.   I spoke with patient's family at the bedside, we talked in detail about patient's condition, plan of care and prognosis and all questions were addressed.    Discharge Diagnoses: Principal Problem:   Acute on chronic combined systolic and diastolic CHF (congestive heart failure) (HCC) Active Problems:   NSTEMI (non-ST elevated myocardial infarction) (HCC)   Acute renal failure superimposed on stage 3a chronic kidney disease (HCC)   Chronic iron  deficiency anemia   DM2 (diabetes mellitus, type 2) (HCC)   Essential hypertension   HLD (hyperlipidemia)   Leukocytosis   Pressure injury of skin   S/P BKA (below knee amputation) bilateral (HCC)  Resolved Problems:   * No resolved hospital problems. [facility] Course: [name] was admitted to the hospital with the working diagnosis of non ST elevation myocardial infarction.   68 year old female with history of chronic diastolic heart failure, type 2 diabetes mellitus, diabetic polyneuropathy, bilateral BKA and bed bound status  long-term nursing home resident, history of hypertension, hyperlipidemia, chronic iron -deficiency anemia  presented to the hospital with dyspnea.  Routine vital signs at the nursing home revealed low oxygen saturations at 80% on room air.  This prompted her to send to ER.  Patient without particular symptoms but with vague symptoms. In the ED her blood pressure was 132/75, HR 103, RR 33 and 02 saturation 88% on room air, up to 95% on 3 L/min per [place].  Lungs with no wheezing or rhonchi, heart with S1 and S2 present and regular, abdomen with no distention and no peripheral edema.   Na 139, K 4,7 Cl 110, bicarbonate 19, glucose 278, bun 18 and cr 1,23  AST 32 and ALT 18  High sensitive troponin 168 and 5,743  Wbc 17,8 hgb 11,6 plt 271   Chest radiograph with hypoinflation, positive cardiomegaly, bilateral cephalization of the vasculature.  CT chest with contrast, negative for pulmonary embolism, small bilateral pleural effusions, more right than left, diffuse bilateral ground glass opacities.   EKG 103 bpm, normal axis, normal intervals, qtc 498, sinus rhythm with poor  RR wave progression, positive PAC and PVC, with no significant ST segment changes, negative T wave lead II, III, aVF, V4 to V6.   Started on heparin  infusion. 4/21, cardiac catheterization with extensive coronary artery disease not amenable to PCI and she is not a candidate for CABG. Placed on furosemide  for diuresis.  04/30 clinically stable for discharge to SNF.   Assessment and Plan: * Acute on chronic systolic CHF (congestive heart failure) (HCC) Echocardiogram with reduced LV systolic function with EF 20 to 25%, global hypokinesis, mild LVH, RV systolic function with mild reduction, RVSP 49.2 mmHg, LA with mid dilatation, mild to moderate mitral  valve regurgitation, moderate TR, trivial pericardial effusion.   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 Pulmonary hypertension   Patient was placed on IV  furosemide  for diuresis, negative fluid balance was achieved, -8426 ml, with improvement in her symptoms.  She lost about 6 Kg during this hospitalization.   Plan to continue heart failure management with isosorbide  and hydralazine  for afterload reduction. Low dose metoprolol   Loop diuretic with furosemide  80 mg po daily.  Limited pharmacologic therapy due to acutely reduced GFR.   Acute hypoxemic respiratory failure due to acute cardiogenic pulmonary edema Patient required bipap to support ventilation, she eventually was weaned off to nasal cannula and by the time of her discharge is on room air with 02 sat 98%.   NSTEMI (non-ST elevated myocardial infarction) (HCC) 04/21 cardiac catheterization with severe multivessel disease with no clear culprit lesion.  Recommendation to continue dual antiplatelet therapy with aspirin  and clopidogrel  for a minimum of 12 months.  Not candidate for CABG.   Acute renal failure superimposed on stage 3a chronic kidney disease (HCC) Hyponatremia.  At the time of her discharge her serum cr is 1,94 with K at 3,8 and serum bicarbonate at 29  Na 136   Plan to continue diuresis with furosemide  80 mg daily and follow up renal function and electrolytes as outpatient in 7 days.   Chronic iron  deficiency anemia Cell count has been stable, continue iron  supplementation   Type 2 diabetes mellitus with hyperlipidemia (HCC) Patient was placed on insulin  sliding scale for glucose cover and monitoring Her glucose remained stable.   Continue statin therapy.   Essential hypertension Continue blood pressure control with hydralazine , isosorbide  and metoprolol .  Leukocytosis Patient ruled for bacterial infection   Pressure injury of skin Continue loca care.  Stage 1 right sacrum present on admission   S/P BKA (below knee amputation) bilateral (HCC) Limited mobility, follow up with PT and OT at SNF   Obesity class 2 with calculated BMI 37.8          Consultants: cardiology  Procedures performed: cardiac catheterization   Disposition: Skilled nursing facility Diet recommendation:  Cardiac and Carb modified diet DISCHARGE MEDICATION: Allergies as of 09/16/2023       Reactions   Banana Swelling, Other (See Comments)   Facial swelling   Penicillins Itching, Swelling, Rash   Face swells and break out   Strawberry Extract Swelling, Other (See Comments)   Facial swelling        Medication List     STOP taking these medications    amLODipine  2.5 MG tablet Commonly known as: NORVASC    gabapentin 400 MG capsule Commonly known as: NEURONTIN   gabapentin 600 MG tablet Commonly known as: NEURONTIN   lisinopril -hydrochlorothiazide  20-12.5 MG tablet Commonly known as: ZESTORETIC       TAKE these medications    albuterol  108 (90 Base) MCG/ACT inhaler Commonly known as: VENTOLIN  HFA Inhale 2 puffs into the lungs in the morning and at bedtime.   ascorbic acid 500 MG tablet Commonly known as: VITAMIN C  Take 500 mg by mouth daily.   aspirin  EC 81 MG tablet Take 81 mg by mouth daily.   atorvastatin  80 MG tablet Commonly known as: LIPITOR  Take 1 tablet (80 mg total) by mouth daily. Start taking on: Sep 17, 2023 What changed:  medication strength how much to take  Another medication with the same name was removed. Continue taking this medication, and follow the directions you see here.   chlorhexidine  0.12 % solution Commonly known as: PERIDEX  Use as directed 5 mLs in the mouth or throat 2 (two) times daily.   clopidogrel  75 MG tablet Commonly known as: PLAVIX  Take 1 tablet (75 mg total) by mouth daily. Start taking on: Sep 17, 2023   Dulaglutide 3 MG/0.5ML Soaj Inject 4.5 mg into the skin. Every Wednesday   ferrous sulfate  325 (65 FE) MG tablet Take 325 mg by mouth 2 (two) times daily.   furosemide  80 MG tablet Commonly known as: LASIX  Take 1 tablet (80 mg total) by mouth daily. Start taking on: Sep 17, 2023   hydrALAZINE  25 MG tablet Commonly known as: APRESOLINE  Take 1 tablet (25 mg total) by mouth every 8 (eight) hours.   IMODIUM PO Take 2 mLs by mouth every 8 (eight) hours as needed (diarrhea). 2mg /76ml   insulin  lispro 100 UNIT/ML injection Commonly known as: HUMALOG Inject 4-14 Units into the skin in the morning and at bedtime. 150-200=4 units,201-250 = 6 units, 251-300 = 8 units, 301-350= 10 units, 351-400= 10 units, 401-450=14 units, 451-500 =12 units >500 = 14 units   isosorbide  dinitrate 20 MG tablet Commonly known as: ISORDIL  Take 1 tablet (20 mg total) by mouth 2 (two) times daily.   Lantus  100 UNIT/ML injection Generic drug: insulin  glargine Inject 0.15 mLs (15 Units total) into the skin 2 (two) times daily. What changed: how much to take   loratadine 10 MG tablet Commonly known as: CLARITIN Take 10 mg by mouth daily.   magnesium  hydroxide 400 MG/5ML suspension Commonly known as: MILK OF MAGNESIA Take 30 mLs by mouth daily as needed for mild constipation.   metFORMIN  500 MG tablet Commonly known as: GLUCOPHAGE  Take 500 mg by mouth daily.   metoprolol  succinate 25 MG 24 hr tablet Commonly known as: TOPROL -XL Take 1 tablet (25 mg total) by mouth daily. Start taking on: Sep 17, 2023 What changed:  medication strength how much to take   nystatin powder Commonly known as: MYCOSTATIN/NYSTOP Apply 1 Application topically 3 (three) times daily. Apply to skin folds/ R side neck topically threes times a day for candidiasis. Apply to reddened areas on inner elbows and abdominal fold/R side of neck   [place] 0.7 % Soln Generic drug: Olopatadine HCl Place 1 drop into both eyes daily.   polyethylene glycol 17 g packet Commonly known as: MIRALAX  / GLYCOLAX  Take 17 g by mouth daily as needed for moderate constipation. What changed: when to take this   senna-docusate 8.6-50 MG tablet Commonly known as: Senokot-S Take 1 tablet by mouth daily.   Sodium Fluoride 1.1  % Pste Place 1 Application onto teeth at bedtime.   VISINE OP Place 1 drop into both eyes at bedtime.        Discharge Exam: Filed Weights   09/14/23 0517 09/15/23 0504 09/16/23 0051  Weight: 104.8 kg 106.4 kg 106.3 kg   BP 122/61   Pulse 77   Temp 97.6 F (36.4 C) (Oral)   Resp 20   Ht 5\' 6"  (1.676 m)   Wt 106.3 kg   SpO2 99%   BMI 37.81 kg/m   Patient with no chest pain or dyspnea, no chest pain   Neurology awake and alert ENT with mild pallor with no icterus Cardiovascular with S1 and S2 present and regular with no gallops rubs or murmurs Respiratory with no  rales or wheezing on anterior auscultation  Abdomen protuberant but not distended Bilateral BKA with no peripheral edema   Condition at discharge: stable  The results of significant diagnostics from this hospitalization (including imaging, microbiology, ancillary and laboratory) are listed below for reference.   Imaging Studies: DG Chest Port 1 View Result Date: 09/13/2023 CLINICAL DATA:  Respiratory distress. EXAM: PORTABLE CHEST 1 VIEW COMPARISON:  09/11/2023 FINDINGS: There is a right arm PICC line with tip at the distal SVC. Stable cardiomediastinal contours. Aortic atherosclerosis. Pulmonary vascular congestion. Bilateral veil like opacification overlying the lower lung zones, right greater than left. Imaging findings are compatible with bilateral posterior scratch set imaging findings compatible with previous pleural effusions and overlying atelectasis/consolidation. Compared with the previous exam there is been interval worsening aeration to the right lower lung. IMPRESSION: 1. Interval worsening aeration to the right lower lung. 2. Pulmonary vascular congestion. Electronically Signed   By: Kimberley Penman M.D.   On: 09/13/2023 06:04   DG CHEST PORT 1 VIEW Result Date: 09/11/2023 CLINICAL DATA:  Short of breath EXAM: PORTABLE CHEST 1 VIEW COMPARISON:  09/10/2023 FINDINGS: Single frontal view of the chest  demonstrates stable enlargement of the cardiac silhouette. Right-sided PICC tip overlies superior vena cava. Veiling opacities are seen at the lung bases, right greater than left, consistent with dependent consolidation and effusions seen on prior CT. No pneumothorax. No acute bony abnormalities. IMPRESSION: 1. Stable enlarged cardiac silhouette. 2. Bibasilar veiling opacities, right greater than left, consistent with consolidation and effusions on prior CT. No significant change. Electronically Signed   By: Bobbye Burrow M.D.   On: 09/11/2023 16:54   DG CHEST PORT 1 VIEW Result Date: 09/10/2023 CLINICAL DATA:  Acute systolic CHF EXAM: PORTABLE CHEST 1 VIEW COMPARISON:  September 06, 2023 FINDINGS: Bilateral reticular interstitial infiltrates right worse than left correlate with congestive changes without consolidations or significant pleural effusions the heart is slightly enlarged. Biventricular configuration stable since prior examination Right PICC line tip in the SVC IMPRESSION: Bilateral reticular interstitial infiltrates right worse than left correlate with congestive changes without consolidations or significant pleural effusions. Electronically Signed   By: Fredrich Jefferson M.D.   On: 09/10/2023 14:21   US  EKG SITE RITE Result Date: 09/08/2023 If [facility] image not attached, placement could not be confirmed due to current cardiac rhythm.  CARDIAC CATHETERIZATION Addendum Date: 09/07/2023   Ost RCA to Prox RCA lesion is 90% stenosed. Prox RCA lesion is 50% stenosed. Mid RCA to Dist RCA lesion is 100% stenosed.  (RPDA and RPL 1 fills via left-to-right collaterals)   Ost Cx to Mid Cx lesion is 60% stenosed with 70% stenosed side branch in 1st Mrg.   3rd Mrg lesion is 50% stenosed. Lat 3rd Mrg lesion is 80% stenosed.   1st LPL lesion is 100% stenosed.   Ramus-1 lesion is 20% stenosed.   Ramus-2 lesion is 90% stenosed.   Ost LAD to Prox LAD lesion is 30% stenosed.   Prox LAD to Mid LAD lesion is 80%  stenosed.   Mid LAD lesion is 40% stenosed.   Hemodynamic findings consistent with severe pulmonary hypertension.   Recent IV heparin  2 hours post TR band removal Patient likely NOT a good CVTS consultation candidate and therefore would plan to treat with DAPT Dominance: Right Severe multivessel CAD: Unclear culprit lesion 90% ostial RCA followed by 50% segmental stenosis and then 100% CTO after small marginal branch (PDA and PL fill via left-to-right collaterals) LM trifurcates into LAD,  RI and small caliber LCx: Proximal LAD 20% followed by 80% focal heavily calcified lesion in a small diagonal branch, there is another 40% mid LAD stenosis but the rest the LAD is relatively free of disease just calcified RI is the same caliber as the LAD with 20% proximal stenosis followed by segment 90% stenosis and then the rest of the vessel is free of disease Small caliber LCx which gives off tiny OM1 and terminates as a bifurcating OM 2 that gives off a sidebranch, the AV groove circumflex has LPL 1 occluded and then 2 small LPL branches. Severe Pulmonary Hypertension-with a significant portion Of WHO Class II component (Acute Combined Systolic and Diastolic Heart Failure): Mean PAP 44 mmHg with a PCWP of 34 mmHg.  (Unable to cross the aortic valve due to difficulty advancing the catheter due to radial spasm.) PAP-mean 54/42-44 mmHg; PCWP 34 mmHg AO sat 100%, PA sat 53%.  Cardiac Output/Index by Albertina Hugger 4.09-1.86 RECOMMENDATIONS Plan is to transfer the patient to CVICU for ongoing care with advanced heart failure team consulting for helping out with treatment of heart failure.   Recommend uninterrupted dual antiplatelet therapy with Aspirin  81mg  daily and Clopidogrel  75mg  daily for a minimum of 12 months (ACS-Class I recommendation).   Recent IV heparin  2 hours post TR band removal - Patient is likely NOT a good CVTS consultation candidate for CABG given co-morbidities & Bilateral amputee.  Would treat with aspirin  / Plavix   DAPT. Load Plavix  300 mg Plavix  today. Will need to discuss with the remainder of the ICU team as to potential options for PCI would most likely targets being RI and LAD lesions that would likely require to minimum shockwave lithotripsy.  Major issue would be access for catheter advancement as the radial access will probably not tolerate 6 [place] guide catheter. Restart IV heparin  2 hours after TR band removal Randene Bustard, MD   Result Date: 09/07/2023 Images from the original result were not included.   Ost RCA to Prox RCA lesion is 90% stenosed. Prox RCA lesion is 50% stenosed. Mid RCA to Dist RCA lesion is 100% stenosed.  (RPDA and RPL 1 fills via left-to-right collaterals)   Ost Cx to Mid Cx lesion is 60% stenosed with 70% stenosed side branch in 1st Mrg.   3rd Mrg lesion is 50% stenosed. Lat 3rd Mrg lesion is 80% stenosed.   1st LPL lesion is 100% stenosed.   Ramus-1 lesion is 20% stenosed.   Ramus-2 lesion is 90% stenosed.   Ost LAD to Prox LAD lesion is 30% stenosed.   Prox LAD to Mid LAD lesion is 80% stenosed.   Mid LAD lesion is 40% stenosed.   Hemodynamic findings consistent with severe pulmonary hypertension. Dominance: Right Severe multivessel CAD: Unclear culprit lesion 90% ostial RCA followed by 50% segmental stenosis and then 100% CTO after small marginal branch (PDA and PL fill via left-to-right collaterals) LM trifurcates into LAD, RI and small caliber LCx: Proximal LAD 20% followed by 80% focal heavily calcified lesion in a small diagonal branch, there is another 40% mid LAD stenosis but the rest the LAD is relatively free of disease just calcified RI is the same caliber as the LAD with 20% proximal stenosis followed by segment 90% stenosis and then the rest of the vessel is free of disease Small caliber LCx which gives off tiny OM1 and terminates as a bifurcating OM 2 that gives off a sidebranch, the AV groove circumflex has LPL 1 occluded and then 2  small LPL branches. Severe Pulmonary  Hypertension-with a significant portion Of WHO Class II component (Acute Combined Systolic and Diastolic Heart Failure): Mean PAP 44 mmHg with a PCWP of 34 mmHg.  (Unable to cross the aortic valve due to difficulty advancing the catheter due to radial spasm.) PAP-mean 54/42-44 mmHg; PCWP 34 mmHg AO sat 100%, PA sat 53%.  Cardiac Output/Index by Albertina Hugger 4.09-1.86 RECOMMENDATIONS Plan is to transfer the patient to CVICU for ongoing care with advanced heart failure team consulting for helping out with treatment of heart failure.   Recommend uninterrupted dual antiplatelet therapy with Aspirin  81mg  daily and Clopidogrel  75mg  daily for a minimum of 12 months (ACS-Class I recommendation).   Recent IV heparin  2 hours post TR band removal Patient likely a good CVTS consultation candidate and therefore would strongly consider aspirin  Plavix  Will need to discuss with the remainder of the ICU team as to potential options for PCI would most likely targets being RI and LAD lesions that would likely require to minimum shockwave lithotripsy.  Major issue would be access for catheter advancement as the radial access will probably not tolerate 6 [place] guide catheter. Restart IV heparin  2 hours after TR band removal Randene Bustard, MD  ECHOCARDIOGRAM COMPLETE Result Date: 09/07/2023    ECHOCARDIOGRAM REPORT   Patient Name:   ARYAHI SNITKER Date of Exam: 09/07/2023 Medical Rec #:  [id number]        Height:       66.0 in Accession #:    [id number]       Weight:       250.2 lb Date of Birth:  [birth year]         BSA:          2.200 m Patient Age:    67 years         BP:           133/79 mmHg Patient Gender: F                HR:           90 bpm. Exam Location:  Inpatient Procedure: 2D Echo, Color Doppler, Cardiac Doppler and Intracardiac            Opacification Agent (Both Spectral and Color Flow Doppler were            utilized during procedure). Indications:    Elevated Troponins  History:        Patient has prior history of  Echocardiogram examinations, most                 recent 01/10/2013. CHF, CAD; Risk Factors:Hypertension, Diabetes                 and Dyslipidemia.  Sonographer:    Sherline Distel Senior RDCS Referring Phys: [id number] Roxana Copier  Sonographer Comments: Technically difficult due to patient body habitus IMPRESSIONS  1. LVEF is difficult to quantify due to beat to beat variability (trigeminy throughout study). Left ventricular ejection fraction, by estimation, is 20 to 25%. The left ventricle has severely decreased function. The left ventricle demonstrates global hypokinesis. There is mild concentric left ventricular hypertrophy. Left ventricular diastolic parameters are consistent with Grade II diastolic dysfunction (pseudonormalization).  2. Right ventricular systolic function is mildly reduced. The right ventricular size is normal. There is moderately elevated pulmonary artery systolic pressure. The estimated right ventricular systolic pressure is 49.2 mmHg.  3. Left atrial size was mildly dilated.  4. The mitral valve is  normal in structure. Mild to moderate mitral valve regurgitation.  5. Tricuspid valve regurgitation is moderate.  6. The aortic valve is tricuspid. There is mild calcification of the aortic valve. Aortic valve regurgitation is not visualized.  7. The inferior vena cava is dilated in size with >50% respiratory variability, suggesting right atrial pressure of 8 mmHg.  8. Cannot exclude a small PFO. FINDINGS  Left Ventricle: LVEF is difficult to quantify due to beat to beat variability (trigeminy throughout study). Left ventricular ejection fraction, by estimation, is 20 to 25%. The left ventricle has severely decreased function. The left ventricle demonstrates global hypokinesis. Definity  contrast agent was given IV to delineate the left ventricular endocardial borders. The left ventricular internal cavity size was normal in size. There is mild concentric left ventricular hypertrophy. Left ventricular  diastolic parameters are consistent with Grade II diastolic dysfunction (pseudonormalization). Right Ventricle: The right ventricular size is normal. No increase in right ventricular wall thickness. Right ventricular systolic function is mildly reduced. There is moderately elevated pulmonary artery systolic pressure. The tricuspid regurgitant velocity is 3.21 m/s, and with an assumed right atrial pressure of 8 mmHg, the estimated right ventricular systolic pressure is 49.2 mmHg. Left Atrium: Left atrial size was mildly dilated. Right Atrium: Right atrial size was normal in size. Pericardium: Trivial pericardial effusion is present. Mitral Valve: The mitral valve is normal in structure. Mild to moderate mitral valve regurgitation. Tricuspid Valve: The tricuspid valve is normal in structure. Tricuspid valve regurgitation is moderate. Aortic Valve: The aortic valve is tricuspid. There is mild calcification of the aortic valve. Aortic valve regurgitation is not visualized. Pulmonic Valve: The pulmonic valve was normal in structure. Pulmonic valve regurgitation is trivial. Aorta: The aortic root and ascending aorta are structurally normal, with no evidence of dilitation. Venous: The inferior vena cava is dilated in size with greater than 50% respiratory variability, suggesting right atrial pressure of 8 mmHg. IAS/Shunts: Cannot exclude a small PFO.  LEFT VENTRICLE PLAX 2D LVIDd:         4.80 cm LVIDs:         4.50 cm LV PW:         1.00 cm LV IVS:        0.90 cm LVOT diam:     2.00 cm LV SV:         39 LV SV Index:   18 LVOT Area:     3.14 cm  RIGHT VENTRICLE RV S prime:     7.90 cm/s TAPSE (M-mode): 1.6 cm LEFT ATRIUM             Index        RIGHT ATRIUM           Index LA diam:        4.30 cm 1.95 cm/m   RA Area:     11.90 cm LA Vol (A2C):   54.0 ml 24.55 ml/m  RA Volume:   26.20 ml  11.91 ml/m LA Vol (A4C):   68.8 ml 31.28 ml/m LA Biplane Vol: 67.7 ml 30.78 ml/m  AORTIC VALVE LVOT Vmax:   75.05 cm/s LVOT  Vmean:  50.050 cm/s LVOT VTI:    0.124 m  AORTA Ao Root diam: 2.90 cm Ao Asc diam:  3.20 cm TRICUSPID VALVE TR Peak grad:   41.2 mmHg TR Vmax:        321.00 cm/s  SHUNTS Systemic VTI:  0.12 m Systemic Diam: 2.00 cm Jules Oar MD Electronically signed by Bearl Limes  Bensimhon MD Signature Date/Time: 09/07/2023/11:22:18 AM    Final    CT Angio Chest PE W and/or Wo Contrast Result Date: 09/06/2023 CLINICAL DATA:  Shortness of breath EXAM: CT ANGIOGRAPHY CHEST WITH CONTRAST TECHNIQUE: Multidetector CT imaging of the chest was performed using the standard protocol during bolus administration of intravenous contrast. Multiplanar CT image reconstructions and MIPs were obtained to evaluate the vascular anatomy. RADIATION DOSE REDUCTION: This exam was performed according to the departmental dose-optimization program which includes automated exposure control, adjustment of the mA and/or kV according to patient size and/or use of iterative reconstruction technique. CONTRAST:  75mL OMNIPAQUE  IOHEXOL  350 MG/ML SOLN COMPARISON:  Chest x-ray 09/06/2023 CT report 06/09/2018 FINDINGS: Cardiovascular: Satisfactory opacification of the pulmonary arteries to the segmental level. No evidence of pulmonary embolism. Moderate aortic atherosclerosis. No aneurysm. Coronary vascular calcification. Mild cardiomegaly. Small pericardial effusion Mediastinum/Nodes: Patent trachea. No thyroid  mass. Subcentimeter mediastinal lymph nodes. Prominent right hilar node measuring 16 mm. Esophagus within normal limits Lungs/Pleura: Small bilateral pleural effusions. Generalized hazy pulmonary densities suspected to represent edema. Mild passive atelectasis in the lower lobes Upper Abdomen: No acute finding partially visualized 2.1 cm left adrenal nodule, stable per prior report Musculoskeletal: Multilevel degenerative changes. No acute osseous abnormality Review of the MIP images confirms the above findings. IMPRESSION: 1. Negative for acute pulmonary  embolus. 2. Cardiomegaly with small bilateral pleural effusions. Diffuse hazy pulmonary density probably represents mild pulmonary edema 3. Mildly prominent right hilar lymph node, nonspecific Aortic Atherosclerosis (ICD10-I70.0). Electronically Signed   By: Esmeralda Hedge M.D.   On: 09/06/2023 19:39   DG Chest Portable 1 View Result Date: 09/06/2023 CLINICAL DATA:  Shortness of breath EXAM: PORTABLE CHEST 1 VIEW COMPARISON:  10/29/2013 FINDINGS: Low lung volumes. Cardiomegaly with mild central congestion. No pleural effusion, focal airspace disease or pneumothorax. IMPRESSION: Cardiomegaly with mild central congestion. Electronically Signed   By: Esmeralda Hedge M.D.   On: 09/06/2023 16:11    Microbiology: Results for orders placed or performed during the hospital encounter of 09/06/23  Resp panel by RT-PCR (RSV, Flu A&B, Covid) Anterior Nasal Swab     Status: None   Collection Time: 09/06/23  2:05 PM   Specimen: Anterior Nasal Swab  Result Value Ref Range Status   SARS Coronavirus 2 by RT PCR NEGATIVE NEGATIVE Final   Influenza A by PCR NEGATIVE NEGATIVE Final   Influenza B by PCR NEGATIVE NEGATIVE Final    Comment: (NOTE) The Xpert Xpress SARS-CoV-2/FLU/RSV plus assay is intended as an aid in the diagnosis of influenza from Nasopharyngeal swab specimens and should not be used as a sole basis for treatment. Nasal washings and aspirates are unacceptable for Xpert Xpress SARS-CoV-2/FLU/RSV testing.  Fact Sheet for Patients: BloggerCourse.com  Fact Sheet for Healthcare Providers: SeriousBroker.it  This test is not yet approved or cleared by the [place]  FDA and has been authorized for detection and/or diagnosis of SARS-CoV-2 by FDA under an Emergency Use Authorization (EUA). This EUA will remain in effect (meaning this test can be used) for the duration of the COVID-19 declaration under Section 564(b)(1) of the Act, 21 U.S.C. section  360bbb-3(b)(1), unless the authorization is terminated or revoked.     Resp Syncytial Virus by PCR NEGATIVE NEGATIVE Final    Comment: (NOTE) Fact Sheet for Patients: BloggerCourse.com  Fact Sheet for Healthcare Providers: SeriousBroker.it  This test is not yet approved or cleared by the [place]  FDA and has been authorized for detection and/or diagnosis of SARS-CoV-2 by FDA under  an Emergency Use Authorization (EUA). This EUA will remain in effect (meaning this test can be used) for the duration of the COVID-19 declaration under Section 564(b)(1) of the Act, 21 U.S.C. section 360bbb-3(b)(1), unless the authorization is terminated or revoked.  Performed at [facility], [address] [place], [place] [address]   MRSA Next Gen by PCR, Nasal     Status: None   Collection Time: 09/07/23  1:10 AM   Specimen: Nasal Mucosa; Nasal Swab  Result Value Ref Range Status   MRSA by PCR Next Gen NOT DETECTED NOT DETECTED Final    Comment: (NOTE) The GeneXpert MRSA Assay (FDA approved for NASAL specimens only), is one component of a comprehensive MRSA colonization surveillance program. It is not intended to diagnose MRSA infection nor to guide or monitor treatment for MRSA infections. Test performance is not FDA approved in patients less than 33 years old. Performed at [facility], [address] [place], [place] [address]   MRSA Next Gen by PCR, Nasal     Status: None   Collection Time: 09/07/23  7:32 PM   Specimen: Nasal Mucosa; Nasal Swab  Result Value Ref Range Status   MRSA by PCR Next Gen NOT DETECTED NOT DETECTED Final    Comment: (NOTE) The GeneXpert MRSA Assay (FDA approved for NASAL specimens only), is one component of a comprehensive MRSA colonization surveillance program. It is not intended to diagnose MRSA infection nor to guide or monitor treatment for MRSA infections. Test performance is not FDA  approved in patients less than 15 years old. Performed at [facility], [address] [place], [place] [address]     Labs: CBC: Recent Labs  Lab 09/10/23 0415 09/13/23 0537  WBC 12.2* 16.8*  HGB 9.5* 10.8*  HCT 30.6* 34.9*  MCV 82.0 81.5  PLT 229 280   Basic Metabolic Panel: Recent Labs  Lab 09/12/23 0359 09/13/23 0430 09/14/23 0500 09/15/23 0900 09/16/23 0500  NA 136 137 137 136 136  K 3.7 3.5 3.7 3.4* 3.8  CL 95* 98 96* 95* 97*  CO2 29 28 28 29 29   GLUCOSE 141* 122* 141* 180* 198*  BUN 40* 38* 41* 44* 44*  CREATININE 2.62* 2.21* 2.29* 2.10* 1.94*  CALCIUM  7.1* 7.3* 7.2* 6.9* 7.0*   Liver Function Tests: Recent Labs  Lab 09/13/23 0537  AST 25  ALT 25  ALKPHOS 120  BILITOT 0.8  PROT 7.2  ALBUMIN  2.5*   CBG: Recent Labs  Lab 09/15/23 1127 09/15/23 1609 09/15/23 2119 09/16/23 0605 09/16/23 1012  GLUCAP 167* 198* 148* 119* 234*    Discharge time spent: greater than 30 minutes.  Signed: Albertus Alt, MD [facility] 09/16/2023

## 2023-09-16 NOTE — Plan of Care (Signed)
  Problem: Education: Goal: Ability to describe self-care measures that may prevent or decrease complications (Diabetes Survival Skills Education) will improve Outcome: Adequate for Discharge Goal: Individualized Educational Video(s) Outcome: Adequate for Discharge   

## 2023-09-16 NOTE — Assessment & Plan Note (Addendum)
 Continue loca care.  Stage 1 right sacrum present on admission

## 2023-09-16 NOTE — Assessment & Plan Note (Signed)
 Patient ruled for bacterial infection

## 2023-09-16 NOTE — Assessment & Plan Note (Addendum)
 Patient was placed on insulin  sliding scale for glucose cover and monitoring Basal insulin .  Her glucose remained stable.  At discharge will resume dulaglutide, insulin  regimen and metformin .  Reduced basal insulin  to avoid hypoglycemia further adjustments as outpatient.   Continue statin therapy.

## 2023-09-16 NOTE — Assessment & Plan Note (Addendum)
 Echocardiogram with reduced LV systolic function with EF 20 to 25%, global hypokinesis, mild LVH, RV systolic function with mild reduction, RVSP 49.2 mmHg, LA with mid dilatation, mild to moderate mitral valve regurgitation, moderate TR, trivial pericardial effusion.   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 Pulmonary hypertension   Patient was placed on IV furosemide  for diuresis, negative fluid balance was achieved, -8426 ml, with improvement in her symptoms.  She lost about 6 Kg during this hospitalization.   Plan to continue heart failure management with isosorbide  and hydralazine  for afterload reduction. Low dose metoprolol   Loop diuretic with furosemide  80 mg po daily.  Limited pharmacologic therapy due to acutely reduced GFR.   Acute hypoxemic respiratory failure due to acute cardiogenic pulmonary edema Patient required bipap to support ventilation, she eventually was weaned off to nasal cannula and by the time of her discharge is on room air with 02 sat 98%.

## 2023-09-16 NOTE — Assessment & Plan Note (Signed)
 Continue blood pressure control with hydralazine , isosorbide  and metoprolol .

## 2023-09-16 NOTE — Progress Notes (Signed)
   Palliative Medicine Inpatient Follow Up Note HPI: 68 y.o. female  with past medical history of chronic diastolic heart failure, type 2 diabetes on insulin , diabetic polyneuropathy, bilateral BKA and bed bound status, long-term nursing home resident, history of hypertension, hyperlipidemia, chronic iron -deficiency anemia  admitted on 09/06/2023 with shortness of breath from nursing home and found to have NSTEMI .    4/21 cardiac catheterization with extensive coronary artery disease not amenable to PCI and she is not a candidate for CABG.   Today's Discussion 09/16/2023  *Please note that this is a verbal dictation therefore any spelling or grammatical errors are due to the "Dragon Medical One" system interpretation.  Chart reviewed inclusive of vital signs, progress notes, laboratory results, and diagnostic images.   I met with [name] at bedside this morning. She denies pain, shortness of breath, or nausea. We had a conversation related her acute on chronic disease - notably her CHF which I shared is a chronic progressive disease.   Created space and opportunity for patient to explore thoughts feelings and fears regarding current medical situation.  [name] shares that her goals are to get back to her home. She is awake that she will need to transition to a rehabilitation - [facility].   Discussed patients advanced care planning and how she has completed these documents. She does not want limitation on care - even at end of life.   Questions and concerns addressed/Palliative Support Provided.   Objective Assessment: Vital Signs Vitals:   09/16/23 1205 09/16/23 1558  BP: 107/85 133/63  Pulse: 90 90  Resp:  20  Temp: 97.8 F (36.6 C) 98.7 F (37.1 C)  SpO2: 98% 93%    Intake/Output Summary (Last 24 hours) at 09/16/2023 1713 Last data filed at 09/16/2023 6045 Gross per 24 hour  Intake 600 ml  Output 300 ml  Net 300 ml   Last Weight  Most recent update: 09/16/2023 12:52 AM     Weight  106.3 kg (234 lb 3.8 oz)            Gen:  Elderly AA F chronically ill appearing HEENT: moist mucous membranes CV: Regular rate and rhythm  PULM:  On RA, breathing is even and nonlabored ABD: soft/nontender  EXT: (+) edema  Neuro: Alert and oriented x3   SUMMARY OF RECOMMENDATIONS   Full Code / Full Scope of Care  Advanced Directives have been completed and are in the chart  Patient will transition to [facility] today  Billing based on MDM: Moderate  ______________________________________________________________________________________ [facility] [place] Palliative Medicine Team Team Cell Phone: [phone] Please utilize secure chat with additional questions, if there is no response within 30 minutes please call the above phone number  Palliative Medicine Team providers are available by phone from 7am to 7pm daily and can be reached through the team cell phone.  Should this patient require assistance outside of these hours, please call the patient's attending physician.

## 2023-09-16 NOTE — TOC Transition Note (Signed)
 Transition of Care [facility]) - Discharge Note   Patient Details  Name: [name] MRN: [id number] Date of Birth: May 04, 1956  [facility] CM/SW Contact:  Arron Big, LCSWA Phone Number: 09/16/2023, 2:20 PM   Clinical Narrative:   Patient will DC to: [place] Anticipated DC date: 09/16/23 Family notified: Unable to reach family Transport by: Lyna Sandhoff   Per MD patient ready for DC to [facility]. RN to call report prior to discharge [phone]; room 314A ). RN, patient, patient's family, and facility notified of DC. Discharge Summary and FL2 sent to facility. DC packet on chart. Ambulance transport requested for patient at 2:10PM.    CSW will sign off for now as social work intervention is no longer needed. Please consult us  again if new needs arise.      Final next level of care: Skilled Nursing Facility Barriers to Discharge: Barriers Resolved   Patient Goals and CMS Choice Patient states their goals for this hospitalization and ongoing recovery are:: to return to [facility]          Discharge Placement              Patient chooses bed at: [facility] Patient to be transferred to facility by: [facility] Name of family member notified: Unable to reach family members Patient and family notified of of transfer: 09/16/23  Discharge Plan and Services Additional resources added to the After Visit Summary for   In-house Referral: Clinical Social Work   Post Acute Care Choice: Skilled Nursing Facility                               Social Drivers of Health (SDOH) Interventions SDOH Screenings   Food Insecurity: No Food Insecurity (09/08/2023)  Housing: Unknown (09/08/2023)  Transportation Needs: No Transportation Needs (09/08/2023)  Utilities: Not At Risk (09/06/2023)  Social Connections: Unknown (09/06/2023)  Tobacco Use: Medium Risk (09/07/2023)     Readmission Risk Interventions     No data to display

## 2023-09-16 NOTE — Progress Notes (Signed)
 PICC removed per order. PICC removal instructions discussed, including remaining flat for , patient verbalized understanding. No complications noted on removal, dressing remains clean/dry/intact at this time.

## 2023-09-16 NOTE — Hospital Course (Addendum)
 [name] was admitted to the hospital with the working diagnosis of non ST elevation myocardial infarction.   68 year old female with history of chronic diastolic heart failure, type 2 diabetes mellitus, diabetic polyneuropathy, bilateral BKA and bed bound status  long-term nursing home resident, history of hypertension, hyperlipidemia, chronic iron -deficiency anemia presented to the hospital with dyspnea.  Routine vital signs at the nursing home revealed low oxygen saturations at 80% on room air.  This prompted her to send to ER.  Patient without particular symptoms but with vague symptoms. In the ED her blood pressure was 132/75, HR 103, RR 33 and 02 saturation 88% on room air, up to 95% on 3 L/min per [place].  Lungs with no wheezing or rhonchi, heart with S1 and S2 present and regular, abdomen with no distention and no peripheral edema.   Na 139, K 4,7 Cl 110, bicarbonate 19, glucose 278, bun 18 and cr 1,23  AST 32 and ALT 18  High sensitive troponin 168 and 5,743  Wbc 17,8 hgb 11,6 plt 271   Chest radiograph with hypoinflation, positive cardiomegaly, bilateral cephalization of the vasculature.  CT chest with contrast, negative for pulmonary embolism, small bilateral pleural effusions, more right than left, diffuse bilateral ground glass opacities.   EKG 103 bpm, normal axis, normal intervals, qtc 498, sinus rhythm with poor  RR wave progression, positive PAC and PVC, with no significant ST segment changes, negative T wave lead II, III, aVF, V4 to V6.   Started on heparin  infusion. 4/21, cardiac catheterization with extensive coronary artery disease not amenable to PCI and she is not a candidate for CABG. Placed on furosemide  for diuresis.  04/30 clinically stable for discharge to SNF.

## 2023-09-16 NOTE — Assessment & Plan Note (Addendum)
 Limited mobility, follow up with PT and OT at SNF   Obesity class 2 with calculated BMI 37.8

## 2023-09-16 NOTE — Progress Notes (Addendum)
 Progress Note  Patient Name: [name] Date of Encounter: 09/16/2023  Primary Cardiologist: None   Subjective   Denies symptoms today, would love to go back to home at nursing facility. No CP, dyspnea, pressure, palpitations. Slept and eating well. She has no questions or concerns.  Inpatient Medications    Scheduled Meds:  aspirin   81 mg Oral Daily   atorvastatin   80 mg Oral Daily   Chlorhexidine  Gluconate Cloth  6 each Topical Daily   clopidogrel   75 mg Oral Daily   feeding supplement  237 mL Oral BID BM   ferrous sulfate   325 mg Oral BID WC   furosemide   80 mg Oral Daily   hydrALAZINE   25 mg Oral Q8H   insulin  aspart  0-15 Units Subcutaneous TID WC   insulin  glargine-yfgn  15 Units Subcutaneous BID   ipratropium-albuterol   3 mL Nebulization Once   isosorbide  dinitrate  20 mg Oral BID   metoprolol  succinate  25 mg Oral Daily   polyethylene glycol  17 g Oral Daily   senna-docusate  1 tablet Oral BID   sodium chloride  flush  10-40 mL Intracatheter Q12H   sodium chloride  flush  3 mL Intravenous Q12H   Continuous Infusions:  PRN Meds: acetaminophen  **OR** acetaminophen , ipratropium-albuterol , labetalol , melatonin, ondansetron  (ZOFRAN ) IV, mouth rinse, sodium chloride  flush, sodium chloride  flush   Vital Signs    Vitals:   09/16/23 0051 09/16/23 0506 09/16/23 0615 09/16/23 0700  BP: 127/65 130/69 122/61   Pulse: 79 77    Resp: 20 (!) 21 18 20   Temp: (!) 97.5 F (36.4 C) (!) 97.5 F (36.4 C)  97.6 F (36.4 C)  TempSrc: Oral Oral  Oral  SpO2: 100% 99%    Weight: 106.3 kg     Height:        Intake/Output Summary (Last 24 hours) at 09/16/2023 1043 Last data filed at 09/16/2023 0835 Gross per 24 hour  Intake 720 ml  Output 300 ml  Net 420 ml   Filed Weights   09/14/23 0517 09/15/23 0504 09/16/23 0051  Weight: 104.8 kg 106.4 kg 106.3 kg    Telemetry    NSR - Personally Reviewed   Physical Exam   GEN: Chronically ill-appearing, morbidly obese. No  acute distress. Wearing oxygen [place] 3L. Neck: No apparent JVD, exam limited by body habitus Cardiac: RRR, distant heart sounds, no murmurs, rubs, or gallops.  Respiratory: CTABL with normal work of breathing GI: Soft, nontender, non-distended  MS: No edema, bilateral BKAs. Neuro:  Nonfocal  Psych: Normal affect   Labs    Chemistry Recent Labs  Lab 09/13/23 0430 09/13/23 0537 09/14/23 0500 09/15/23 0900  NA 137  --  137 136  K 3.5  --  3.7 3.4*  CL 98  --  96* 95*  CO2 28  --  28 29  GLUCOSE 122*  --  141* 180*  BUN 38*  --  41* 44*  CREATININE 2.21*  --  2.29* 2.10*  CALCIUM  7.3*  --  7.2* 6.9*  PROT  --  7.2  --   --   ALBUMIN   --  2.5*  --   --   AST  --  25  --   --   ALT  --  25  --   --   ALKPHOS  --  120  --   --   BILITOT  --  0.8  --   --   GFRNONAA 24*  --  23* 25*  ANIONGAP 11  --  13 12     Hematology Recent Labs  Lab 09/09/23 1101 09/10/23 0415 09/13/23 0537  WBC  --  12.2* 16.8*  RBC 3.94 3.73* 4.28  HGB  --  9.5* 10.8*  HCT  --  30.6* 34.9*  MCV  --  82.0 81.5  MCH  --  25.5* 25.2*  MCHC  --  31.0 30.9  RDW  --  18.4* 18.6*  PLT  --  229 280    Cardiac EnzymesNo results for input(s): "TROPONINI" in the last 168 hours. No results for input(s): "TROPIPOC" in the last 168 hours.   BNP Recent Labs  Lab 09/13/23 0537  BNP 789.9*     DDimer  Recent Labs  Lab 09/13/23 0537  DDIMER 2.07*     Radiology    No results found.  Cardiac Studies   ECHO 4/21 with EF 20-25%, global hypokinesis of LV, concentric LV hypertrophy, mild reduced RV systolic function, moderate tricuspid regurgitation. R&LHC 4/21:  Severe MV CAD - Ost RCA 90%-50% prox RCA & 100% CTO mRCA-bifurcation; ost-prox LAD ~30% with 80% heavily calcified focal lesion at D1 and 40% mid LAD.  (Collaterals from septals fill PDA); RI (same caliber as LAD) has 90% proximal stenosis; tiny caliber LCx with 1 tiny OM that bifurcates into OM 2 with sidebranch and LPL 1-very small caliber  and not a PCI target. Severe PAH: Significant portion WHO Class II related to Acute on Chronic Combined HF PAP-mean 54/42-44 mmHg; PCWP 34 mmHg AO sat 100%, PA sat 53%.  Cardiac Output/Index by Albertina Hugger 4.09-1.86  Dominance: Right  Patient Profile     68 y.o. female with history of nonobstructive CAD, HF with recovered EF, insulin  dependent DM II, bilateral AKA, CVA, CKD IIIa. Admitted with NSTEMI and acute respiratory failure 2/2 CHF.   Assessment & Plan   Principal Problem:   NSTEMI (non-ST elevated myocardial infarction) (HCC) Active Problems:   Acute on chronic combined systolic and diastolic CHF (congestive heart failure) (HCC)   Ischemic cardiomyopathy   Acute renal failure superimposed on stage 3a chronic kidney disease (HCC)   HLD (hyperlipidemia)   Essential hypertension   S/P BKA (below knee amputation) bilateral (HCC)   Leukocytosis   Chronic diastolic CHF (congestive heart failure) (HCC)   DM2 (diabetes mellitus, type 2) (HCC)   Chronic iron  deficiency anemia   Pressure injury of skin  NSTEMI with multivessel disease and ischemic cardiomyopathy Troponin > 24k at admission last week. LHC with mulitivessal CAD but not a CABG candidate and poorly amenable to PCI given comorbidities and AKI. Essentially three-vessel disease, occluded RCA and LAD/RI disease. Medical management:  - DAPT aspirin  and plavix  and high intensity atorvastatin  80 every day - Hydralazine  25mg  q8h, Isordil  20mg  BID for afterload and angina treatment  Acute on Chronic combined systolic/diastolic HF with EF 40-10% Flash Pulmonary Edema In past recovered EF but Echo 4/21 demonstarted EF 20-25%. Diuretics early during hospitalization per renal function, resumed Saturday from flash pulmonary edema. Volume exam challenging given habitus.  - Finished aggressive diuresis, pt now euvolemic net neutral x 2 days - Going forward: Lasix  80 po daily - GDMT: Toprol  XL 25 daily. In outpatient follow-up, will consider  further titration as well as restarting aldactone  if Cr allows and GLP-1. Limited baseline functionality makes her a poor candidate for an SGLT2i. - Hydralazine  25 mg 3 times daily for afterload reduction, with Isordil  20 mg twice daily   PVCs On metoprolol ,  burden improved   Acute on chronic kidney failure (baseline CKD 3A) Creatinine steady above 2, previous baseline 1.2. Await new baseline as she stabilizes.   *Will sign off today. Pt safe to discharge to home facility from cardiology perspective.  [facility] will sign off.   Medication Recommendations:  Lasix  80 daily, Toprol  XL 25 daily, Hydralazine  25 TID, Isordil  20mg  daily, Asa 81 daily, Plavix  75 daily, atorvastatin  80 daily Follow up as an outpatient:  Yes, cardiology fu 5/19 with Marlana Silvan NP at [facility]  For questions or updates, please contact [facility] Please consult [email or web address] for contact info under Cardiology/STEMI.   Signed, Carleen Chary, DO, IM Resident PGY-1 09/16/2023, 10:43 AM    ATTENDING ATTESTATION  I have seen, examined and evaluated the patient this morning on rounds along with the resident physician-Dr. Diann Forth.  After reviewing all the available data and chart, we discussed the patients laboratory, study & physical findings as well as symptoms in detail.  I agree with his findings, examination as well as impression recommendations as per our discussion.    Attending adjustments noted in italics.   She looks great today.  In good spirits.  On good medications.  Seems to be diuresing well on oral Lasix  and stable.  As per yesterday's note no change.  Creatinine today was 1.94 indicating that there may be some sign of recovery Outpatient follow-ups established.  Discussed with attending hospitalist that I think she is stable from our perspective for discharge to skilled nursing facility today.  Will defer to Dr. [place], but were she to have symptoms of angina or further episodes  of acute heart failure, not unreasonable to at least discussed the potential of LAD, RI revascularization if her renal function is stable.     Arleen Lacer, MD, MS Randene Bustard, M.D., M.S. Interventional Cardiologist  [facility]  Pager # [phone] Phone # [phone] [address] [place], [place] [address]

## 2023-09-16 NOTE — Assessment & Plan Note (Signed)
 Hyponatremia.  At the time of her discharge her serum cr is 1,94 with K at 3,8 and serum bicarbonate at 29  Na 136   Plan to continue diuresis with furosemide  80 mg daily and follow up renal function and electrolytes as outpatient in 7 days.

## 2023-10-05 ENCOUNTER — Ambulatory Visit: Attending: Nurse Practitioner | Admitting: Nurse Practitioner

## 2023-10-05 ENCOUNTER — Encounter: Payer: Self-pay | Admitting: Nurse Practitioner

## 2023-10-05 VITALS — BP 105/53 | Ht 66.0 in

## 2023-10-05 DIAGNOSIS — I493 Ventricular premature depolarization: Secondary | ICD-10-CM

## 2023-10-05 DIAGNOSIS — Z794 Long term (current) use of insulin: Secondary | ICD-10-CM

## 2023-10-05 DIAGNOSIS — I340 Nonrheumatic mitral (valve) insufficiency: Secondary | ICD-10-CM | POA: Diagnosis not present

## 2023-10-05 DIAGNOSIS — I2510 Atherosclerotic heart disease of native coronary artery without angina pectoris: Secondary | ICD-10-CM

## 2023-10-05 DIAGNOSIS — I10 Essential (primary) hypertension: Secondary | ICD-10-CM

## 2023-10-05 DIAGNOSIS — I5042 Chronic combined systolic (congestive) and diastolic (congestive) heart failure: Secondary | ICD-10-CM | POA: Diagnosis not present

## 2023-10-05 DIAGNOSIS — E118 Type 2 diabetes mellitus with unspecified complications: Secondary | ICD-10-CM

## 2023-10-05 DIAGNOSIS — Z8673 Personal history of transient ischemic attack (TIA), and cerebral infarction without residual deficits: Secondary | ICD-10-CM

## 2023-10-05 DIAGNOSIS — E785 Hyperlipidemia, unspecified: Secondary | ICD-10-CM

## 2023-10-05 DIAGNOSIS — N1831 Chronic kidney disease, stage 3a: Secondary | ICD-10-CM

## 2023-10-05 MED ORDER — METOPROLOL SUCCINATE ER 25 MG PO TB24
37.5000 mg | ORAL_TABLET | Freq: Every day | ORAL | 3 refills | Status: DC
Start: 2023-10-05 — End: 2023-11-12

## 2023-10-05 NOTE — Progress Notes (Signed)
 Office Visit    Patient Name: [name] Date of Encounter: 10/05/2023  Primary Care Provider:  Terrye Fiedler, Gerardo E, MD Primary Cardiologist:  Peter Swaziland, MD  Chief Complaint    68 year old female with a history of severe multivessel CAD, chronic combined systolic and diastolic heart failure, mitral valve regurgitation, PVCs, pulmonary hypertension, hypertension, hyperlipidemia, CKD stage IIIa, chronic iron  deficiency anemia, type 2 diabetes, s/p BKA and CVA who presents for hospital follow-up related CAD and heart failure.  Past Medical History    Past Medical History:  Diagnosis Date   Atypical chest pain    a. non obstructive by cath in 2008 and 2010;  b. 02/2012 Myoview : non-ischemic, EF 57%   Cellulitis    a. left foot-> s/p L BKA   Chronic kidney disease    Constipation    CVA (cerebral infarction)    a.  Small right parietal noted incidentally 04/2007;  b. right sided embolic CVA 05/2012;  c. TEE 2/14:  LVH, EF 55-60%, mild LAE, no LAA clot, no PFO, no R->L shunt by echo contrast, oscillating density on AV likely Lambl's Excressence    Diabetes mellitus, type II (HCC)    Diabetic retinopathy (HCC)    NPDR w/edema OU   Diaphragmatic hernia without mention of obstruction or gangrene    Dry skin    Esophageal reflux    HTN (hypertension)    Hx of BKA (HCC)    Bilateral   Hyperlipidemia    Irritable bowel syndrome    Morbid obesity (HCC)    Obesity, unspecified    Pain in shoulder 06/28/2015   Peripheral neuropathy    Stroke [facility])    2013-'no residual'   Syncope and collapse    a. near-syncopal episode in November 2008;  b. 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   Tobacco abuse    Vertigo    Past Surgical History:  Procedure Laterality Date   AMPUTATION  12/30/2011   Procedure: AMPUTATION RAY;  Surgeon: Amada Backer, MD;  Location: [facility] OR;  Service: Orthopedics;  Laterality: Left;  LEFT FIRST RAY AMPUTATION   AMPUTATION  01/13/2012   Procedure:  AMPUTATION BELOW KNEE;  Surgeon: Amada Backer, MD;  Location: [facility] OR;  Service: Orthopedics;  Laterality: Left;   AMPUTATION  03/04/2012   Procedure: AMPUTATION BELOW KNEE;  Surgeon: Amada Backer, MD;  Location: [facility] OR;  Service: Orthopedics;  Laterality: Left;  Revision of Left Below Knee Amputation   AMPUTATION Right 11/02/2013   Procedure: AMPUTATION BELOW KNEE;  Surgeon: Timothy Ford, MD;  Location: [facility] OR;  Service: Orthopedics;  Laterality: Right;  Right Below Knee Amputation   BIOPSY  03/29/2019   Procedure: BIOPSY;  Surgeon: Annis Kinder, DO;  Location: [facility] ENDOSCOPY;  Service: Gastroenterology;;  EGD and Colon   CARDIAC CATHETERIZATION     LAD 30%, circumflex 50%, OM 75%, RI 60% with small branch 80%, dominant RCA 60%, EF 45-50%   CHOLECYSTECTOMY     COLONOSCOPY     COLONOSCOPY WITH PROPOFOL  N/A 03/29/2019   Procedure: COLONOSCOPY WITH PROPOFOL ;  Surgeon: Annis Kinder, DO;  Location: [facility] ENDOSCOPY;  Service: Gastroenterology;  Laterality: N/A;   ESOPHAGOGASTRODUODENOSCOPY (EGD) WITH PROPOFOL  N/A 03/29/2019   Procedure: ESOPHAGOGASTRODUODENOSCOPY (EGD) WITH PROPOFOL ;  Surgeon: Annis Kinder, DO;  Location: [facility] ENDOSCOPY;  Service: Gastroenterology;  Laterality: N/A;   EYE SURGERY Right    cataract   I & D EXTREMITY  12/23/2011   Procedure: IRRIGATION AND DEBRIDEMENT EXTREMITY;  Surgeon: Landon Pinion  Alesia Husky, MD;  Location: [facility] OR;  Service: Orthopedics;  Laterality: Left;  Left Foot   I & D EXTREMITY  12/26/2011   Procedure: IRRIGATION AND DEBRIDEMENT EXTREMITY;  Surgeon: Amada Backer, MD;  Location: [facility] OR;  Service: Orthopedics;  Laterality: Left;  LEFT FOOT I&D WITH POSSIBLE WOUND VAC APPLICATION, POSSIBLE LEFT FIRST RAY AMPUTATION   I & D EXTREMITY Right 10/29/2013   Procedure: IRRIGATION AND DEBRIDEMENT EXTREMITY;  Surgeon: Loel Ring, MD;  Location: [facility] OR;  Service: Orthopedics;  Laterality: Right;   Invasive Electrophysiologic Study  5/09   followed by insertion of an  implantable loop recorder. S/p removal    Multiple Toe Surgeries     POLYPECTOMY  03/29/2019   Procedure: POLYPECTOMY;  Surgeon: Annis Kinder, DO;  Location: [facility] ENDOSCOPY;  Service: Gastroenterology;;   RIGHT/LEFT HEART CATH AND CORONARY ANGIOGRAPHY N/A 09/07/2023   Procedure: RIGHT/LEFT HEART CATH AND CORONARY ANGIOGRAPHY;  Surgeon: Arleen Lacer, MD;  Location: [facility] INVASIVE CV LAB;  Service: Cardiovascular;  Laterality: N/A;   TEE WITHOUT CARDIOVERSION N/A 07/07/2012   Procedure: TRANSESOPHAGEAL ECHOCARDIOGRAM (TEE);  Surgeon: Lenise Quince, MD;  Location: [facility] ENDOSCOPY;  Service: Cardiovascular;  Laterality: N/A;    Allergies  Allergies  Allergen Reactions   Banana Swelling and Other (See Comments)    Facial swelling   Penicillins Itching, Swelling and Rash    Face swells and break out   Strawberry Extract Swelling and Other (See Comments)    Facial swelling     Labs/Other Studies Reviewed    The following studies were reviewed today:  Cardiac Studies & Procedures   ______________________________________________________________________________________________ CARDIAC CATHETERIZATION  CARDIAC CATHETERIZATION 09/07/2023  Conclusion Images from the original result were not included.    Ost RCA to Prox RCA lesion is 90% stenosed. Prox RCA lesion is 50% stenosed. Mid RCA to Dist RCA lesion is 100% stenosed.  (RPDA and RPL 1 fills via left-to-right collaterals)   Ost Cx to Mid Cx lesion is 60% stenosed with 70% stenosed side branch in 1st Mrg.   3rd Mrg lesion is 50% stenosed. Lat 3rd Mrg lesion is 80% stenosed.   1st LPL lesion is 100% stenosed.   Ramus-1 lesion is 20% stenosed.   Ramus-2 lesion is 90% stenosed.   Ost LAD to Prox LAD lesion is 30% stenosed.   Prox LAD to Mid LAD lesion is 80% stenosed.   Mid LAD lesion is 40% stenosed.   Hemodynamic findings consistent with severe pulmonary hypertension.   Recent IV heparin  2 hours post TR band removal Patient  likely NOT a good CVTS consultation candidate and therefore would plan to treat with DAPT  Dominance: Right  Severe multivessel CAD: Unclear culprit lesion 90% ostial RCA followed by 50% segmental stenosis and then 100% CTO after small marginal branch (PDA and PL fill via left-to-right collaterals) LM trifurcates into LAD, RI and small caliber LCx: Proximal LAD 20% followed by 80% focal heavily calcified lesion in a small diagonal branch, there is another 40% mid LAD stenosis but the rest the LAD is relatively free of disease just calcified RI is the same caliber as the LAD with 20% proximal stenosis followed by segment 90% stenosis and then the rest of the vessel is free of disease Small caliber LCx which gives off tiny OM1 and terminates as a bifurcating OM 2 that gives off a sidebranch, the AV groove circumflex has LPL 1 occluded and then 2 small LPL branches. Severe Pulmonary Hypertension-with a significant  portion Of WHO Class II component (Acute Combined Systolic and Diastolic Heart Failure): Mean PAP 44 mmHg with a PCWP of 34 mmHg.  (Unable to cross the aortic valve due to difficulty advancing the catheter due to radial spasm.) PAP-mean 54/42-44 mmHg; PCWP 34 mmHg AO sat 100%, PA sat 53%.  Cardiac Output/Index by Albertina Hugger 4.09-1.86   RECOMMENDATIONS Plan is to transfer the patient to CVICU for ongoing care with advanced heart failure team consulting for helping out with treatment of heart failure.   Recommend uninterrupted dual antiplatelet therapy with Aspirin  81mg  daily and Clopidogrel  75mg  daily for a minimum of 12 months (ACS-Class I recommendation).   Recent IV heparin  2 hours post TR band removal - Patient is likely NOT a good CVTS consultation candidate for CABG given co-morbidities & Bilateral amputee.  Would treat with aspirin  / Plavix  DAPT. Load Plavix  300 mg Plavix  today. Will need to discuss with the remainder of the ICU team as to potential options for PCI would most likely  targets being RI and LAD lesions that would likely require to minimum shockwave lithotripsy.  Major issue would be access for catheter advancement as the radial access will probably not tolerate 6 [place] guide catheter. Restart IV heparin  2 hours after TR band removal   Randene Bustard, MD  Findings Coronary Findings Diagnostic  Dominance: Right  Left Anterior Descending There is mild diffuse disease throughout the vessel. Ost LAD to Prox LAD lesion is 30% stenosed. Prox LAD to Mid LAD lesion is 80% stenosed. Vessel is the culprit lesion. The lesion is type B2, located at the major branch, focal and irregular. The lesion is severely calcified. Mid LAD lesion is 40% stenosed.  First Diagonal Branch Vessel is small in size.  Second Diagonal Branch Vessel is small in size.  Third Diagonal Branch Vessel is small in size.  Ramus Intermedius Vessel is large. The vessel exhibits minimal luminal irregularities. Ramus-1 lesion is 20% stenosed. Ramus-2 lesion is 90% stenosed. Vessel is the culprit lesion. The lesion is type B1 and focal. The lesion is mildly calcified.  Left Circumflex Vessel is small. There is moderate diffuse disease throughout the vessel. Ost Cx to Mid Cx lesion is 60% stenosed with 70% stenosed side branch in 1st Mrg.  First Obtuse Marginal Branch Vessel is small in size.  Third Obtuse Marginal Branch There is moderate disease in the vessel. 3rd Mrg lesion is 50% stenosed.  Lateral Third Obtuse Marginal Branch Lat 3rd Mrg lesion is 80% stenosed.  First Left Posterolateral Branch 1st LPL lesion is 100% stenosed.  Right Coronary Artery Vessel was injected. Vessel is normal in caliber. There is severe diffuse disease throughout the vessel. There is severe focal disease in the vessel. Ost RCA to Prox RCA lesion is 90% stenosed. Prox RCA lesion is 50% stenosed. Mid RCA to Dist RCA lesion is 100% stenosed.  Right Ventricular Branch Vessel is small in  size.  Right Posterior Descending Artery Vessel is small in size. There is moderate disease in the vessel. Collaterals RPDA filled by collaterals from 2nd Sept.  Right Posterior Atrioventricular Artery There is mild disease in the vessel.  First Right Posterolateral Branch There is mild disease in the vessel. Collaterals 1st RPL filled by collaterals from 3rd LPL.  Intervention  No interventions have been documented.     ECHOCARDIOGRAM  ECHOCARDIOGRAM COMPLETE 09/07/2023  Narrative ECHOCARDIOGRAM REPORT    Patient Name:   [name] Date of Exam: 09/07/2023 Medical Rec #:  [id number]  Height:       66.0 in Accession #:    [id number]       Weight:       250.2 lb Date of Birth:  Jul 28, 1955         BSA:          2.200 m Patient Age:    67 years         BP:           133/79 mmHg Patient Gender: F                HR:           90 bpm. Exam Location:  Inpatient  Procedure: 2D Echo, Color Doppler, Cardiac Doppler and Intracardiac Opacification Agent (Both Spectral and Color Flow Doppler were utilized during procedure).  Indications:    Elevated Troponins  History:        Patient has prior history of Echocardiogram examinations, most recent 01/10/2013. CHF, CAD; Risk Factors:Hypertension, Diabetes and Dyslipidemia.  Sonographer:    Sherline Distel Senior RDCS Referring Phys: [id number] Roxana Copier   Sonographer Comments: Technically difficult due to patient body habitus IMPRESSIONS   1. LVEF is difficult to quantify due to beat to beat variability (trigeminy throughout study). Left ventricular ejection fraction, by estimation, is 20 to 25%. The left ventricle has severely decreased function. The left ventricle demonstrates global hypokinesis. There is mild concentric left ventricular hypertrophy. Left ventricular diastolic parameters are consistent with Grade II diastolic dysfunction (pseudonormalization). 2. Right ventricular systolic function is mildly reduced. The  right ventricular size is normal. There is moderately elevated pulmonary artery systolic pressure. The estimated right ventricular systolic pressure is 49.2 mmHg. 3. Left atrial size was mildly dilated. 4. The mitral valve is normal in structure. Mild to moderate mitral valve regurgitation. 5. Tricuspid valve regurgitation is moderate. 6. The aortic valve is tricuspid. There is mild calcification of the aortic valve. Aortic valve regurgitation is not visualized. 7. The inferior vena cava is dilated in size with >50% respiratory variability, suggesting right atrial pressure of 8 mmHg. 8. Cannot exclude a small PFO.  FINDINGS Left Ventricle: LVEF is difficult to quantify due to beat to beat variability (trigeminy throughout study). Left ventricular ejection fraction, by estimation, is 20 to 25%. The left ventricle has severely decreased function. The left ventricle demonstrates global hypokinesis. Definity  contrast agent was given IV to delineate the left ventricular endocardial borders. The left ventricular internal cavity size was normal in size. There is mild concentric left ventricular hypertrophy. Left ventricular diastolic parameters are consistent with Grade II diastolic dysfunction (pseudonormalization).  Right Ventricle: The right ventricular size is normal. No increase in right ventricular wall thickness. Right ventricular systolic function is mildly reduced. There is moderately elevated pulmonary artery systolic pressure. The tricuspid regurgitant velocity is 3.21 m/s, and with an assumed right atrial pressure of 8 mmHg, the estimated right ventricular systolic pressure is 49.2 mmHg.  Left Atrium: Left atrial size was mildly dilated.  Right Atrium: Right atrial size was normal in size.  Pericardium: Trivial pericardial effusion is present.  Mitral Valve: The mitral valve is normal in structure. Mild to moderate mitral valve regurgitation.  Tricuspid Valve: The tricuspid valve is  normal in structure. Tricuspid valve regurgitation is moderate.  Aortic Valve: The aortic valve is tricuspid. There is mild calcification of the aortic valve. Aortic valve regurgitation is not visualized.  Pulmonic Valve: The pulmonic valve was normal in structure. Pulmonic valve regurgitation is trivial.  Aorta:  The aortic root and ascending aorta are structurally normal, with no evidence of dilitation.  Venous: The inferior vena cava is dilated in size with greater than 50% respiratory variability, suggesting right atrial pressure of 8 mmHg.  IAS/Shunts: Cannot exclude a small PFO.   LEFT VENTRICLE PLAX 2D LVIDd:         4.80 cm LVIDs:         4.50 cm LV PW:         1.00 cm LV IVS:        0.90 cm LVOT diam:     2.00 cm LV SV:         39 LV SV Index:   18 LVOT Area:     3.14 cm   RIGHT VENTRICLE RV S prime:     7.90 cm/s TAPSE (M-mode): 1.6 cm  LEFT ATRIUM             Index        RIGHT ATRIUM           Index LA diam:        4.30 cm 1.95 cm/m   RA Area:     11.90 cm LA Vol (A2C):   54.0 ml 24.55 ml/m  RA Volume:   26.20 ml  11.91 ml/m LA Vol (A4C):   68.8 ml 31.28 ml/m LA Biplane Vol: 67.7 ml 30.78 ml/m AORTIC VALVE LVOT Vmax:   75.05 cm/s LVOT Vmean:  50.050 cm/s LVOT VTI:    0.124 m  AORTA Ao Root diam: 2.90 cm Ao Asc diam:  3.20 cm  TRICUSPID VALVE TR Peak grad:   41.2 mmHg TR Vmax:        321.00 cm/s  SHUNTS Systemic VTI:  0.12 m Systemic Diam: 2.00 cm  Jules Oar MD Electronically signed by Jules Oar MD Signature Date/Time: 09/07/2023/11:22:18 AM    Final          ______________________________________________________________________________________________     Recent Labs: 09/09/2023: Magnesium  2.2 09/13/2023: ALT 25; B Natriuretic Peptide 789.9; Hemoglobin 10.8; Platelets 280 09/16/2023: BUN 44; Creatinine, Ser 1.94; Potassium 3.8; Sodium 136  Recent Lipid Panel    Component Value Date/Time   CHOL 127 09/07/2023 0856    TRIG 132 09/07/2023 0856   HDL 40 (L) 09/07/2023 0856   CHOLHDL 3.2 09/07/2023 0856   VLDL 26 09/07/2023 0856   LDLCALC 61 09/07/2023 0856    History of Present Illness   68 year old female with the above past medical history including severe multivessel CAD, chronic combined systolic and diastolic heart failure, mitral valve regurgitation, PVCs, pulmonary hypertension, hypertension, hyperlipidemia, CKD stage IIIa, chronic iron  deficiency anemia, type 2 diabetes, BKA and CVA.  She was hospitalized in April 2025 in the setting of NSTEMI, acute on chronic combined systolic and diastolic heart failure, AKI on CKD stage IIIa.  Cardiology was consulted, troponin was elevated greater than 24,000 on admission.  Cardiac catheterization revealed multivessel CAD with occluded RCA and LAD/RI disease, she was not considered candidate for CABG, poorly amenable to PCI given comorbidities, AKI.  Hemodynamic findings were consistent with severe pulmonary hypertension.  Medical therapy was advised.  Echocardiogram showed EF 20 to 25%, severely decreased LV function, mild concentric LVH, G2 DD, mildly reduced RV systolic function, moderately elevated PASP, 49.2 mmHg, mild to moderate mitral valve regurgitation.  She was started on DAPT with aspirin  and Plavix , high intensity statin, hydralazine , isosordil.  She experienced flash pulmonary edema, aggressive diuresis, she was transition to oral Lasix  80 mg  daily.  It was noted she was likely a poor candidate for SGLT2 inhibitor given limited baseline functionality.  Consideration of spironolactone  as an outpatient was recommended pending renal function. She was discharged to SNF on 09/16/2023.  She presents today for follow-up accompanied by a facility representative.  Since her hospitalization she has been stable from a cardiac standpoint. EKG today shows frequent PVCs. She denies any chest pain, palpitations, denies dyspnea, edema, PND, orthopnea. BP is mildly low in  office today.  Overall, she reports feeling well.  Home Medications    Current Outpatient Medications  Medication Sig Dispense Refill   albuterol  (VENTOLIN  HFA) 108 (90 Base) MCG/ACT inhaler Inhale 2 puffs into the lungs in the morning and at bedtime.     aspirin  EC 81 MG tablet Take 81 mg by mouth daily.     atorvastatin  (LIPITOR ) 80 MG tablet Take 1 tablet (80 mg total) by mouth daily. 30 tablet 0   chlorhexidine  (PERIDEX ) 0.12 % solution Use as directed 5 mLs in the mouth or throat 2 (two) times daily.     cholecalciferol (VITAMIN D3) 25 MCG (1000 UNIT) tablet Take 50 mcg by mouth daily.     clopidogrel  (PLAVIX ) 75 MG tablet Take 1 tablet (75 mg total) by mouth daily. 30 tablet 0   Dulaglutide 3 MG/0.5ML SOAJ Inject 4.5 mg into the skin. Every Wednesday     ergocalciferol (VITAMIN D2) 1.25 MG (50000 UT) capsule Take 50,000 Units by mouth once a week.     ferrous sulfate  325 (65 FE) MG tablet Take 325 mg by mouth 2 (two) times daily.     furosemide  (LASIX ) 80 MG tablet Take 1 tablet (80 mg total) by mouth daily. 30 tablet 0   hydrALAZINE  (APRESOLINE ) 25 MG tablet Take 1 tablet (25 mg total) by mouth every 8 (eight) hours. 90 tablet 0   insulin  lispro (HUMALOG) 100 UNIT/ML injection Inject 4-14 Units into the skin in the morning and at bedtime. 150-200=4 units,201-250 = 6 units, 251-300 = 8 units, 301-350= 10 units, 351-400= 10 units, 401-450=14 units, 451-500 =12 units >500 = 14 units     isosorbide  dinitrate (ISORDIL ) 20 MG tablet Take 1 tablet (20 mg total) by mouth 2 (two) times daily. 60 tablet 0   LANTUS  100 UNIT/ML injection Inject 0.15 mLs (15 Units total) into the skin 2 (two) times daily.     Loperamide HCl (IMODIUM PO) Take 2 mLs by mouth every 8 (eight) hours as needed (diarrhea). 2mg /17ml     loratadine (CLARITIN) 10 MG tablet Take 10 mg by mouth daily.     magnesium  hydroxide (MILK OF MAGNESIA) 400 MG/5ML suspension Take 30 mLs by mouth daily as needed for mild constipation.      metFORMIN  (GLUCOPHAGE ) 500 MG tablet Take 500 mg by mouth daily.     nystatin (MYCOSTATIN/NYSTOP) powder Apply 1 Application topically 3 (three) times daily. Apply to skin folds/ R side neck topically threes times a day for candidiasis. Apply to reddened areas on inner elbows and abdominal fold/R side of neck     PAZEO 0.7 % SOLN Place 1 drop into both eyes daily.      polyethylene glycol (MIRALAX  / GLYCOLAX ) packet Take 17 g by mouth daily as needed for moderate constipation. (Patient taking differently: Take 17 g by mouth every other day.) 14 each 0   senna-docusate (SENOKOT-S) 8.6-50 MG tablet Take 1 tablet by mouth daily.     Sodium Fluoride 1.1 % PSTE Place 1 Application  onto teeth at bedtime.     Tetrahydrozoline HCl (VISINE OP) Place 1 drop into both eyes at bedtime.     vitamin C  (ASCORBIC ACID) 500 MG tablet Take 500 mg by mouth daily.     metoprolol  succinate (TOPROL -XL) 25 MG 24 hr tablet Take 1.5 tablets (37.5 mg total) by mouth daily. 45 tablet 3   Current Facility-Administered Medications  Medication Dose Route Frequency Provider Last Rate Last Admin   aflibercept  (EYLEA ) SOLN 2 mg  2 mg Intravitreal  Zamora, Brian, MD   2 mg at 03/19/18 1621   Bevacizumab  (AVASTIN ) SOLN 1.25 mg  1.25 mg Intravitreal  Zamora, Brian, MD   1.25 mg at 11/01/17 2300   Bevacizumab  (AVASTIN ) SOLN 1.25 mg  1.25 mg Intravitreal  Zamora, Brian, MD   1.25 mg at 12/01/17 1437   Bevacizumab  (AVASTIN ) SOLN 1.25 mg  1.25 mg Intravitreal  Zamora, Brian, MD   1.25 mg at 12/31/17 2354   Bevacizumab  (AVASTIN ) SOLN 1.25 mg  1.25 mg Intravitreal  Zamora, Brian, MD   1.25 mg at 01/27/18 1508     Review of Systems    She denies chest pain, palpitations, dyspnea, pnd, orthopnea, n, v, dizziness, syncope, edema, weight gain, or early satiety. All other systems reviewed and are otherwise negative except as noted above.   Physical Exam    VS:  BP (!) 105/53 (BP Location: Left Arm, Cuff Size: Small)   Ht 5\' 6"  (1.676 m)    BMI 37.81 kg/m  GEN: Well nourished, well developed, in no acute distress. HEENT: normal. Neck: Supple, no JVD, carotid bruits, or masses. Cardiac: RRR, no murmurs, rubs, or gallops. No clubbing, cyanosis, edema.  Radials/DP/PT 2+ and equal bilaterally.  Right radial cath site without bruising, bruit, bleeding or hematoma. Respiratory:  Respirations regular and unlabored, clear to auscultation bilaterally. GI: Obese, soft, nontender, nondistended, BS + x 4. MS: no deformity or atrophy.  Bilateral BKA.  Skin: warm and dry, no rash. Neuro:  Strength and sensation are intact. Psych: Normal affect.  Accessory Clinical Findings    ECG personally reviewed by me today - EKG Interpretation Date/Time:  Monday Oct 05 2023 14:39:19 EDT Ventricular Rate:  83 PR Interval:  246 QRS Duration:  100 QT Interval:  424 QTC Calculation: 498 R Axis:   78  Text Interpretation: Sinus rhythm with 1st degree A-V block with frequent Premature ventricular complexes Possible Anterior infarct , age undetermined ST & T wave abnormality, consider inferolateral ischemia Confirmed by Marlana Silvan ([id number]) on 10/05/2023 2:52:07 PM  - no acute changes.   Lab Results  Component Value Date   WBC 16.8 (H) 09/13/2023   HGB 10.8 (L) 09/13/2023   HCT 34.9 (L) 09/13/2023   MCV 81.5 09/13/2023   PLT 280 09/13/2023   Lab Results  Component Value Date   CREATININE 1.94 (H) 09/16/2023   BUN 44 (H) 09/16/2023   NA 136 09/16/2023   K 3.8 09/16/2023   CL 97 (L) 09/16/2023   CO2 29 09/16/2023   Lab Results  Component Value Date   ALT 25 09/13/2023   AST 25 09/13/2023   ALKPHOS 120 09/13/2023   BILITOT 0.8 09/13/2023   Lab Results  Component Value Date   CHOL 127 09/07/2023   HDL 40 (L) 09/07/2023   LDLCALC 61 09/07/2023   TRIG 132 09/07/2023   CHOLHDL 3.2 09/07/2023    Lab Results  Component Value Date   HGBA1C 7.3 (H) 09/07/2023    Assessment &  Plan   1. CAD: Cardiac catheterization revealed  multivessel CAD with occluded RCA and LAD/RI disease, she was not considered candidate for CABG, poorly amenable to PCI given comorbidities, AKI.  Per Dr. Jill Motes last note during recent hospitalization, "Will defer to Dr. Swazil[place], but were she to have symptoms of angina or further episodes of acute heart failure, not unreasonable to at least discuss the potential of LAD, RI revascularization if her renal function is stable." Stable with no anginal symptoms.  Continue aspirin , Plavix , Isordil , hydralazine , metoprolol  as below, and Lipitor .  2. Chronic combined systolic and diastolic heart failure: Echocardiogram in 08/2023 showed EF 20 to 25%, severely decreased LV function, mild concentric LVH, G2 DD, mildly reduced RV systolic function, moderately elevated PASP, 49.2 mmHg, mild to moderate mitral valve regurgitation.  Generally euvolemic and well compensated on exam somewhat difficult to assess fluid volume status given body habitus.  Will check BNP today.  Further escalation of GDMT limited in the setting of hypotension, likely a poor candidate for SGLT2 inhibitor given limited baseline functionality (she ambulates rarely, is essentially wheelchair bound). Continue current medications as above with increased metoprolol  as below, continue Lasix .  Consider repeat echocardiogram at follow-up to reevaluate LVEF.  3. Mitral valve regurgitation: Mild to moderate on most recent echo.  Generally euvolemic and well compensated on exam, appears asymptomatic. Consider repeat echocardiogram as clinically indicated.  4. PVCs: Frequent on today's EKG. Fortunately, she is asymptomatic. Will check BMET, CBC, TSH, magnesium  today.  Will increase metoprolol  to 37.5 mg twice daily.  Continue to monitor HR, BP.    5. Hypertension: BP borderline low in office today.  Continue to monitor with increased metoprolol  as above.  6. Hyperlipidemia: LDL was 61 in 08/2023.  Lipitor  was recently increased.  Consider repeat fasting  lipids, LFTs at follow-up.  Continue Lipitor .  7. CKD stage IIIa-b: Creatinine was 1.94 on 09/16/2023. Repeat BMET pending as above.   8. Type 2 diabetes: A1c was 7.3 in 08/2023. Monitored and managed per PCP.   9. History of CVA: Conitnue ASA, Plavix , Lipitor .   10. Disposition: Follow-up in 2 to 3 months with Dr. [place].      Jude Norton, NP 10/05/2023, 5:21 PM

## 2023-10-05 NOTE — Patient Instructions (Addendum)
 Medication Instructions:  Increase Metoprolol  37.5 mg daily  *If you need a refill on your cardiac medications before your next appointment, please call your pharmacy*  Lab Work: BMET, BNP, CBC, TSH   Testing/Procedures: NONE ordered at this time of appointment   Follow-Up: At [facility], you and your health needs are our priority.  As part of our continuing mission to provide you with exceptional heart care, our providers are all part of one team.  This team includes your primary Cardiologist (physician) and Advanced Practice Providers or APPs (Physician Assistants and Nurse Practitioners) who all work together to provide you with the care you need, when you need it.  Your next appointment:   2-3 month(s)  Provider:   Liane Redman, PA-C          We recommend signing up for the patient portal called "MyChart".  Sign up information is provided on this After Visit Summary.  MyChart is used to connect with patients for Virtual Visits (Telemedicine).  Patients are able to view lab/test results, encounter notes, upcoming appointments, etc.  Non-urgent messages can be sent to your provider as well.   To learn more about what you can do with MyChart, go to [email or web address].

## 2023-10-23 ENCOUNTER — Telehealth: Payer: Self-pay | Admitting: Cardiology

## 2023-10-23 NOTE — Telephone Encounter (Signed)
 I spoke with  Vicie Grain, PA at [facility] [phone]   She says she is worried the pts BP has been "soft"... I sent her the pts last OV note and ECG to help document on her end why we increased her Toprol .   She will have the lab results faxed to us  that was ordered by Sherline Distel at her last OV.

## 2023-10-23 NOTE — Telephone Encounter (Signed)
 Per Vicie Grain, PA at [facility] she would like to speak to a nurse about this pt. Also would like her last ov not faxed to [phone] because pt didn't have it when she came back from visit.

## 2023-10-23 NOTE — Telephone Encounter (Signed)
 Called back with the correct fax #[phone]

## 2023-10-23 NOTE — Telephone Encounter (Signed)
 Records refaxed to the new fax number.

## 2023-11-11 ENCOUNTER — Other Ambulatory Visit: Payer: Self-pay

## 2023-11-11 ENCOUNTER — Emergency Department (HOSPITAL_COMMUNITY)

## 2023-11-11 ENCOUNTER — Encounter (HOSPITAL_COMMUNITY): Payer: Self-pay | Admitting: Emergency Medicine

## 2023-11-11 ENCOUNTER — Inpatient Hospital Stay (HOSPITAL_COMMUNITY)
Admission: EM | Admit: 2023-11-11 | Discharge: 2023-12-18 | DRG: 871 | Disposition: E | Source: Skilled Nursing Facility | Attending: Internal Medicine | Admitting: Internal Medicine

## 2023-11-11 DIAGNOSIS — I21A1 Myocardial infarction type 2: Secondary | ICD-10-CM | POA: Diagnosis not present

## 2023-11-11 DIAGNOSIS — J189 Pneumonia, unspecified organism: Secondary | ICD-10-CM | POA: Diagnosis present

## 2023-11-11 DIAGNOSIS — Z833 Family history of diabetes mellitus: Secondary | ICD-10-CM

## 2023-11-11 DIAGNOSIS — I5043 Acute on chronic combined systolic (congestive) and diastolic (congestive) heart failure: Secondary | ICD-10-CM | POA: Diagnosis present

## 2023-11-11 DIAGNOSIS — E1165 Type 2 diabetes mellitus with hyperglycemia: Secondary | ICD-10-CM | POA: Diagnosis not present

## 2023-11-11 DIAGNOSIS — Z7982 Long term (current) use of aspirin: Secondary | ICD-10-CM

## 2023-11-11 DIAGNOSIS — R0602 Shortness of breath: Secondary | ICD-10-CM | POA: Diagnosis present

## 2023-11-11 DIAGNOSIS — E1169 Type 2 diabetes mellitus with other specified complication: Secondary | ICD-10-CM | POA: Diagnosis present

## 2023-11-11 DIAGNOSIS — I6389 Other cerebral infarction: Secondary | ICD-10-CM | POA: Diagnosis not present

## 2023-11-11 DIAGNOSIS — N179 Acute kidney failure, unspecified: Secondary | ICD-10-CM | POA: Diagnosis not present

## 2023-11-11 DIAGNOSIS — I6381 Other cerebral infarction due to occlusion or stenosis of small artery: Secondary | ICD-10-CM | POA: Diagnosis not present

## 2023-11-11 DIAGNOSIS — I255 Ischemic cardiomyopathy: Secondary | ICD-10-CM | POA: Diagnosis present

## 2023-11-11 DIAGNOSIS — I69391 Dysphagia following cerebral infarction: Secondary | ICD-10-CM | POA: Diagnosis not present

## 2023-11-11 DIAGNOSIS — Z515 Encounter for palliative care: Secondary | ICD-10-CM

## 2023-11-11 DIAGNOSIS — R54 Age-related physical debility: Secondary | ICD-10-CM | POA: Diagnosis present

## 2023-11-11 DIAGNOSIS — D509 Iron deficiency anemia, unspecified: Secondary | ICD-10-CM | POA: Diagnosis present

## 2023-11-11 DIAGNOSIS — I340 Nonrheumatic mitral (valve) insufficiency: Secondary | ICD-10-CM | POA: Diagnosis present

## 2023-11-11 DIAGNOSIS — N186 End stage renal disease: Secondary | ICD-10-CM | POA: Diagnosis present

## 2023-11-11 DIAGNOSIS — E874 Mixed disorder of acid-base balance: Secondary | ICD-10-CM | POA: Diagnosis present

## 2023-11-11 DIAGNOSIS — A419 Sepsis, unspecified organism: Principal | ICD-10-CM | POA: Diagnosis present

## 2023-11-11 DIAGNOSIS — Z6838 Body mass index (BMI) 38.0-38.9, adult: Secondary | ICD-10-CM

## 2023-11-11 DIAGNOSIS — Z7985 Long-term (current) use of injectable non-insulin antidiabetic drugs: Secondary | ICD-10-CM

## 2023-11-11 DIAGNOSIS — Z66 Do not resuscitate: Secondary | ICD-10-CM | POA: Diagnosis not present

## 2023-11-11 DIAGNOSIS — R34 Anuria and oliguria: Secondary | ICD-10-CM | POA: Diagnosis not present

## 2023-11-11 DIAGNOSIS — T508X5A Adverse effect of diagnostic agents, initial encounter: Secondary | ICD-10-CM | POA: Diagnosis not present

## 2023-11-11 DIAGNOSIS — I953 Hypotension of hemodialysis: Secondary | ICD-10-CM | POA: Diagnosis not present

## 2023-11-11 DIAGNOSIS — Z8673 Personal history of transient ischemic attack (TIA), and cerebral infarction without residual deficits: Secondary | ICD-10-CM

## 2023-11-11 DIAGNOSIS — Z91018 Allergy to other foods: Secondary | ICD-10-CM

## 2023-11-11 DIAGNOSIS — E785 Hyperlipidemia, unspecified: Secondary | ICD-10-CM | POA: Diagnosis present

## 2023-11-11 DIAGNOSIS — N170 Acute kidney failure with tubular necrosis: Secondary | ICD-10-CM | POA: Diagnosis not present

## 2023-11-11 DIAGNOSIS — I480 Paroxysmal atrial fibrillation: Secondary | ICD-10-CM | POA: Diagnosis present

## 2023-11-11 DIAGNOSIS — E1122 Type 2 diabetes mellitus with diabetic chronic kidney disease: Secondary | ICD-10-CM | POA: Diagnosis present

## 2023-11-11 DIAGNOSIS — I214 Non-ST elevation (NSTEMI) myocardial infarction: Secondary | ICD-10-CM | POA: Diagnosis not present

## 2023-11-11 DIAGNOSIS — I252 Old myocardial infarction: Secondary | ICD-10-CM

## 2023-11-11 DIAGNOSIS — E1142 Type 2 diabetes mellitus with diabetic polyneuropathy: Secondary | ICD-10-CM | POA: Diagnosis present

## 2023-11-11 DIAGNOSIS — B379 Candidiasis, unspecified: Secondary | ICD-10-CM | POA: Diagnosis present

## 2023-11-11 DIAGNOSIS — Z7984 Long term (current) use of oral hypoglycemic drugs: Secondary | ICD-10-CM

## 2023-11-11 DIAGNOSIS — I679 Cerebrovascular disease, unspecified: Secondary | ICD-10-CM | POA: Diagnosis present

## 2023-11-11 DIAGNOSIS — I4891 Unspecified atrial fibrillation: Secondary | ICD-10-CM | POA: Diagnosis not present

## 2023-11-11 DIAGNOSIS — R29705 NIHSS score 5: Secondary | ICD-10-CM | POA: Diagnosis not present

## 2023-11-11 DIAGNOSIS — R29709 NIHSS score 9: Secondary | ICD-10-CM | POA: Diagnosis not present

## 2023-11-11 DIAGNOSIS — G9341 Metabolic encephalopathy: Secondary | ICD-10-CM | POA: Diagnosis not present

## 2023-11-11 DIAGNOSIS — I509 Heart failure, unspecified: Secondary | ICD-10-CM

## 2023-11-11 DIAGNOSIS — K219 Gastro-esophageal reflux disease without esophagitis: Secondary | ICD-10-CM | POA: Diagnosis present

## 2023-11-11 DIAGNOSIS — Z880 Allergy status to penicillin: Secondary | ICD-10-CM

## 2023-11-11 DIAGNOSIS — F17210 Nicotine dependence, cigarettes, uncomplicated: Secondary | ICD-10-CM | POA: Diagnosis present

## 2023-11-11 DIAGNOSIS — I132 Hypertensive heart and chronic kidney disease with heart failure and with stage 5 chronic kidney disease, or end stage renal disease: Secondary | ICD-10-CM | POA: Diagnosis present

## 2023-11-11 DIAGNOSIS — D631 Anemia in chronic kidney disease: Secondary | ICD-10-CM | POA: Diagnosis present

## 2023-11-11 DIAGNOSIS — N178 Other acute kidney failure: Secondary | ICD-10-CM | POA: Diagnosis not present

## 2023-11-11 DIAGNOSIS — Z7189 Other specified counseling: Secondary | ICD-10-CM | POA: Diagnosis not present

## 2023-11-11 DIAGNOSIS — E66812 Obesity, class 2: Secondary | ICD-10-CM | POA: Diagnosis present

## 2023-11-11 DIAGNOSIS — I2510 Atherosclerotic heart disease of native coronary artery without angina pectoris: Secondary | ICD-10-CM | POA: Diagnosis present

## 2023-11-11 DIAGNOSIS — Z992 Dependence on renal dialysis: Secondary | ICD-10-CM | POA: Diagnosis not present

## 2023-11-11 DIAGNOSIS — I10 Essential (primary) hypertension: Secondary | ICD-10-CM | POA: Diagnosis present

## 2023-11-11 DIAGNOSIS — Z7902 Long term (current) use of antithrombotics/antiplatelets: Secondary | ICD-10-CM

## 2023-11-11 DIAGNOSIS — E1151 Type 2 diabetes mellitus with diabetic peripheral angiopathy without gangrene: Secondary | ICD-10-CM | POA: Diagnosis not present

## 2023-11-11 DIAGNOSIS — J690 Pneumonitis due to inhalation of food and vomit: Secondary | ICD-10-CM | POA: Diagnosis present

## 2023-11-11 DIAGNOSIS — I272 Pulmonary hypertension, unspecified: Secondary | ICD-10-CM | POA: Diagnosis present

## 2023-11-11 DIAGNOSIS — R6520 Severe sepsis without septic shock: Secondary | ICD-10-CM | POA: Diagnosis present

## 2023-11-11 DIAGNOSIS — I6523 Occlusion and stenosis of bilateral carotid arteries: Secondary | ICD-10-CM | POA: Diagnosis not present

## 2023-11-11 DIAGNOSIS — R29715 NIHSS score 15: Secondary | ICD-10-CM | POA: Diagnosis not present

## 2023-11-11 DIAGNOSIS — I639 Cerebral infarction, unspecified: Secondary | ICD-10-CM | POA: Diagnosis not present

## 2023-11-11 DIAGNOSIS — Z79899 Other long term (current) drug therapy: Secondary | ICD-10-CM

## 2023-11-11 DIAGNOSIS — E113299 Type 2 diabetes mellitus with mild nonproliferative diabetic retinopathy without macular edema, unspecified eye: Secondary | ICD-10-CM | POA: Diagnosis present

## 2023-11-11 DIAGNOSIS — R1310 Dysphagia, unspecified: Secondary | ICD-10-CM | POA: Diagnosis not present

## 2023-11-11 DIAGNOSIS — J9690 Respiratory failure, unspecified, unspecified whether with hypoxia or hypercapnia: Secondary | ICD-10-CM | POA: Diagnosis not present

## 2023-11-11 DIAGNOSIS — J9601 Acute respiratory failure with hypoxia: Secondary | ICD-10-CM | POA: Diagnosis present

## 2023-11-11 DIAGNOSIS — N1831 Chronic kidney disease, stage 3a: Secondary | ICD-10-CM | POA: Diagnosis not present

## 2023-11-11 DIAGNOSIS — N171 Acute kidney failure with acute cortical necrosis: Secondary | ICD-10-CM | POA: Diagnosis not present

## 2023-11-11 DIAGNOSIS — I2489 Other forms of acute ischemic heart disease: Secondary | ICD-10-CM | POA: Diagnosis present

## 2023-11-11 DIAGNOSIS — E669 Obesity, unspecified: Secondary | ICD-10-CM | POA: Diagnosis present

## 2023-11-11 DIAGNOSIS — Z8249 Family history of ischemic heart disease and other diseases of the circulatory system: Secondary | ICD-10-CM

## 2023-11-11 DIAGNOSIS — Z89511 Acquired absence of right leg below knee: Secondary | ICD-10-CM

## 2023-11-11 DIAGNOSIS — R569 Unspecified convulsions: Secondary | ICD-10-CM | POA: Diagnosis not present

## 2023-11-11 DIAGNOSIS — Z89512 Acquired absence of left leg below knee: Secondary | ICD-10-CM

## 2023-11-11 DIAGNOSIS — E876 Hypokalemia: Secondary | ICD-10-CM | POA: Diagnosis not present

## 2023-11-11 DIAGNOSIS — Z794 Long term (current) use of insulin: Secondary | ICD-10-CM

## 2023-11-11 DIAGNOSIS — I959 Hypotension, unspecified: Secondary | ICD-10-CM | POA: Diagnosis present

## 2023-11-11 LAB — I-STAT CG4 LACTIC ACID, ED
Lactic Acid, Venous: 3.7 mmol/L (ref 0.5–1.9)
Lactic Acid, Venous: 5 mmol/L (ref 0.5–1.9)
Lactic Acid, Venous: 3.5 mmol/L (ref 0.5–1.9)

## 2023-11-11 LAB — BASIC METABOLIC PANEL WITH GFR
Anion gap: 18 — ABNORMAL HIGH (ref 5–15)
BUN: 25 mg/dL — ABNORMAL HIGH (ref 8–23)
CO2: 18 mmol/L — ABNORMAL LOW (ref 22–32)
Calcium: 8.5 mg/dL — ABNORMAL LOW (ref 8.9–10.3)
Chloride: 103 mmol/L (ref 98–111)
Creatinine, Ser: 1.51 mg/dL — ABNORMAL HIGH (ref 0.44–1.00)
GFR, Estimated: 37 mL/min — ABNORMAL LOW (ref >60)
Glucose, Bld: 308 mg/dL — ABNORMAL HIGH (ref 70–99)
Potassium: 3.7 mmol/L (ref 3.5–5.1)
Sodium: 139 mmol/L (ref 135–145)

## 2023-11-11 LAB — CBC WITH DIFFERENTIAL/PLATELET
Abs Immature Granulocytes: 0.29 10*3/uL — ABNORMAL HIGH (ref 0.00–0.07)
Basophils Absolute: 0.1 10*3/uL (ref 0.0–0.1)
Basophils Relative: 0 %
Eosinophils Absolute: 0.3 10*3/uL (ref 0.0–0.5)
Eosinophils Relative: 1 %
HCT: 40.8 % (ref 36.0–46.0)
Hemoglobin: 12.3 g/dL (ref 12.0–15.0)
Immature Granulocytes: 1 %
Lymphocytes Relative: 11 %
Lymphs Abs: 2.7 10*3/uL (ref 0.7–4.0)
MCH: 26.1 pg (ref 26.0–34.0)
MCHC: 30.1 g/dL (ref 30.0–36.0)
MCV: 86.4 fL (ref 80.0–100.0)
Monocytes Absolute: 0.4 10*3/uL (ref 0.1–1.0)
Monocytes Relative: 2 %
Neutro Abs: 20.6 10*3/uL — ABNORMAL HIGH (ref 1.7–7.7)
Neutrophils Relative %: 85 %
Platelets: 327 10*3/uL (ref 150–400)
RBC: 4.72 MIL/uL (ref 3.87–5.11)
RDW: 19.5 % — ABNORMAL HIGH (ref 11.5–15.5)
WBC: 24.4 10*3/uL — ABNORMAL HIGH (ref 4.0–10.5)
nRBC: 0 % (ref 0.0–0.2)

## 2023-11-11 LAB — I-STAT VENOUS BLOOD GAS, ED
Acid-base deficit: 4 mmol/L — ABNORMAL HIGH (ref 0.0–2.0)
Bicarbonate: 19.7 mmol/L — ABNORMAL LOW (ref 20.0–28.0)
Calcium, Ion: 0.9 mmol/L — ABNORMAL LOW (ref 1.15–1.40)
HCT: 32 % — ABNORMAL LOW (ref 36.0–46.0)
Hemoglobin: 10.9 g/dL — ABNORMAL LOW (ref 12.0–15.0)
O2 Saturation: 83 %
Potassium: 3.7 mmol/L (ref 3.5–5.1)
Sodium: 139 mmol/L (ref 135–145)
TCO2: 21 mmol/L — ABNORMAL LOW (ref 22–32)
pCO2, Ven: 29.4 mmHg — ABNORMAL LOW (ref 44–60)
pH, Ven: 7.433 — ABNORMAL HIGH (ref 7.25–7.43)
pO2, Ven: 45 mmHg (ref 32–45)

## 2023-11-11 LAB — ETHANOL: Alcohol, Ethyl (B): <15 mg/dL (ref <15)

## 2023-11-11 LAB — BRAIN NATRIURETIC PEPTIDE: B Natriuretic Peptide: 527.4 pg/mL — ABNORMAL HIGH (ref 0.0–100.0)

## 2023-11-11 LAB — TROPONIN I (HIGH SENSITIVITY)
Troponin I (High Sensitivity): 1347 ng/L (ref <18)
Troponin I (High Sensitivity): 2159 ng/L (ref <18)

## 2023-11-11 LAB — CBG MONITORING, ED: Glucose-Capillary: 149 mg/dL — ABNORMAL HIGH (ref 70–99)

## 2023-11-11 LAB — PROCALCITONIN: Procalcitonin: 0.6 ng/mL

## 2023-11-11 MED ORDER — HEPARIN BOLUS VIA INFUSION
4000.0000 [IU] | Freq: Once | INTRAVENOUS | Status: AC
  Administered 2023-11-11: 4000 [IU] via INTRAVENOUS
  Filled 2023-11-11: qty 4000

## 2023-11-11 MED ORDER — VANCOMYCIN HCL 2000 MG/400ML IV SOLN
2000.0000 mg | Freq: Once | INTRAVENOUS | Status: AC
  Administered 2023-11-11: 2000 mg via INTRAVENOUS
  Filled 2023-11-11: qty 400

## 2023-11-11 MED ORDER — FUROSEMIDE 10 MG/ML IJ SOLN
40.0000 mg | Freq: Two times a day (BID) | INTRAMUSCULAR | Status: AC
  Administered 2023-11-12 (×2): 40 mg via INTRAVENOUS
  Filled 2023-11-11 (×2): qty 4

## 2023-11-11 MED ORDER — METRONIDAZOLE 500 MG/100ML IV SOLN
500.0000 mg | Freq: Two times a day (BID) | INTRAVENOUS | Status: DC
  Administered 2023-11-11 – 2023-11-12 (×2): 500 mg via INTRAVENOUS
  Filled 2023-11-11 (×2): qty 100

## 2023-11-11 MED ORDER — INSULIN ASPART 100 UNIT/ML IJ SOLN
0.0000 [IU] | Freq: Three times a day (TID) | INTRAMUSCULAR | Status: DC
  Administered 2023-11-12: 3 [IU] via SUBCUTANEOUS
  Administered 2023-11-12 (×2): 1 [IU] via SUBCUTANEOUS
  Administered 2023-11-13 (×3): 2 [IU] via SUBCUTANEOUS
  Administered 2023-11-14 (×2): 3 [IU] via SUBCUTANEOUS
  Administered 2023-11-14: 2 [IU] via SUBCUTANEOUS
  Administered 2023-11-15 (×3): 3 [IU] via SUBCUTANEOUS
  Administered 2023-11-16: 5 [IU] via SUBCUTANEOUS
  Administered 2023-11-16: 2 [IU] via SUBCUTANEOUS
  Administered 2023-11-16: 7 [IU] via SUBCUTANEOUS
  Administered 2023-11-17: 3 [IU] via SUBCUTANEOUS

## 2023-11-11 MED ORDER — SODIUM CHLORIDE 0.9 % IV SOLN
2.0000 g | Freq: Two times a day (BID) | INTRAVENOUS | Status: DC
  Administered 2023-11-12 – 2023-11-13 (×3): 2 g via INTRAVENOUS
  Filled 2023-11-11 (×3): qty 12.5

## 2023-11-11 MED ORDER — HEPARIN (PORCINE) 25000 UT/250ML-% IV SOLN
1250.0000 [IU]/h | INTRAVENOUS | Status: DC
  Administered 2023-11-11: 1200 [IU]/h via INTRAVENOUS
  Administered 2023-11-12: 1250 [IU]/h via INTRAVENOUS
  Filled 2023-11-11 (×2): qty 250

## 2023-11-11 MED ORDER — ACETAMINOPHEN 325 MG PO TABS
650.0000 mg | ORAL_TABLET | Freq: Four times a day (QID) | ORAL | Status: DC | PRN
  Administered 2023-11-15 – 2023-11-23 (×2): 650 mg via ORAL
  Filled 2023-11-11 (×2): qty 2

## 2023-11-11 MED ORDER — CLOPIDOGREL BISULFATE 75 MG PO TABS
75.0000 mg | ORAL_TABLET | Freq: Every day | ORAL | Status: DC
  Administered 2023-11-12 – 2023-11-23 (×11): 75 mg via ORAL
  Filled 2023-11-11 (×11): qty 1

## 2023-11-11 MED ORDER — IOHEXOL 350 MG/ML SOLN
75.0000 mL | Freq: Once | INTRAVENOUS | Status: AC | PRN
  Administered 2023-11-11: 75 mL via INTRAVENOUS

## 2023-11-11 MED ORDER — ACETAMINOPHEN 650 MG RE SUPP: 650.0000 mg | Freq: Four times a day (QID) | RECTAL | Status: DC | PRN

## 2023-11-11 MED ORDER — ATORVASTATIN CALCIUM 80 MG PO TABS
80.0000 mg | ORAL_TABLET | Freq: Every day | ORAL | Status: DC
  Administered 2023-11-12 – 2023-11-23 (×10): 80 mg via ORAL
  Filled 2023-11-11 (×5): qty 1
  Filled 2023-11-11: qty 2
  Filled 2023-11-11 (×5): qty 1

## 2023-11-11 MED ORDER — ONDANSETRON HCL 4 MG/2ML IJ SOLN: 4.0000 mg | Freq: Four times a day (QID) | INTRAMUSCULAR | Status: DC | PRN

## 2023-11-11 MED ORDER — METOPROLOL TARTRATE 5 MG/5ML IV SOLN
5.0000 mg | Freq: Once | INTRAVENOUS | Status: AC
  Administered 2023-11-11: 5 mg via INTRAVENOUS
  Filled 2023-11-11: qty 5

## 2023-11-11 MED ORDER — LACTATED RINGERS IV BOLUS
500.0000 mL | Freq: Once | INTRAVENOUS | Status: AC
  Administered 2023-11-11: 500 mL via INTRAVENOUS

## 2023-11-11 MED ORDER — VANCOMYCIN HCL 1750 MG/350ML IV SOLN: 1750.0000 mg | INTRAVENOUS | Status: DC

## 2023-11-11 MED ORDER — PANTOPRAZOLE SODIUM 40 MG PO TBEC
40.0000 mg | DELAYED_RELEASE_TABLET | Freq: Every day | ORAL | Status: DC
  Administered 2023-11-12 – 2023-11-20 (×8): 40 mg via ORAL
  Filled 2023-11-11 (×8): qty 1

## 2023-11-11 MED ORDER — ONDANSETRON HCL 4 MG PO TABS: 4.0000 mg | ORAL_TABLET | Freq: Four times a day (QID) | ORAL | Status: DC | PRN

## 2023-11-11 MED ORDER — MORPHINE SULFATE (PF) 2 MG/ML IV SOLN: 2.0000 mg | INTRAVENOUS | Status: DC | PRN

## 2023-11-11 MED ORDER — THIAMINE MONONITRATE 100 MG PO TABS
100.0000 mg | ORAL_TABLET | Freq: Every day | ORAL | Status: DC
  Administered 2023-11-12 – 2023-11-23 (×10): 100 mg via ORAL
  Filled 2023-11-11 (×11): qty 1

## 2023-11-11 MED ORDER — SODIUM CHLORIDE 0.9% FLUSH
3.0000 mL | Freq: Two times a day (BID) | INTRAVENOUS | Status: DC
  Administered 2023-11-12 – 2023-11-23 (×21): 3 mL via INTRAVENOUS

## 2023-11-11 MED ORDER — SODIUM CHLORIDE 0.9 % IV SOLN
2.0000 g | Freq: Once | INTRAVENOUS | Status: AC
  Administered 2023-11-11: 2 g via INTRAVENOUS
  Filled 2023-11-11: qty 12.5

## 2023-11-11 MED ORDER — FUROSEMIDE 10 MG/ML IJ SOLN
80.0000 mg | Freq: Once | INTRAMUSCULAR | Status: AC
  Administered 2023-11-11: 80 mg via INTRAVENOUS
  Filled 2023-11-11: qty 8

## 2023-11-11 NOTE — ED Provider Notes (Addendum)
 [place] EMERGENCY DEPARTMENT AT [facility] Provider Note  CSN: [id number] Arrival date & time: 11/11/23 1252  Chief Complaint(s) Shortness of Breath  HPI [name] is a 68 y.o. female Coming from [facility] for shortness of breath.  Patient has a history of of CHF, was admitted in April for NSTEMI.  She comes in today for shortness of breath.  Patient's last echocardiogram showed an EF of 20 to 25%.   Past Medical History Past Medical History:  Diagnosis Date   Atypical chest pain    a. non obstructive by cath in 2008 and 2010;  b. 02/2012 Myoview : non-ischemic, EF 57%   Cellulitis    a. left foot-> s/p L BKA   Chronic kidney disease    Constipation    CVA (cerebral infarction)    a.  Small right parietal noted incidentally 04/2007;  b. right sided embolic CVA 05/2012;  c. TEE 2/14:  LVH, EF 55-60%, mild LAE, no LAA clot, no PFO, no R->L shunt by echo contrast, oscillating density on AV likely Lambl's Excressence    Diabetes mellitus, type II (HCC)    Diabetic retinopathy (HCC)    NPDR w/edema OU   Diaphragmatic hernia without mention of obstruction or gangrene    Dry skin    Esophageal reflux    HTN (hypertension)    Hx of BKA (HCC)    Bilateral   Hyperlipidemia    Irritable bowel syndrome    Morbid obesity (HCC)    Obesity, unspecified    Pain in shoulder 06/28/2015   Peripheral neuropathy    Stroke [facility])    2013-'no residual'   Syncope and collapse    a. near-syncopal episode in November 2008;  b. 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   Tobacco abuse    Vertigo    Patient Active Problem List   Diagnosis Date Noted   Pressure injury of skin 09/14/2023   Ischemic cardiomyopathy 09/14/2023   Acute renal failure superimposed on stage 3a chronic kidney disease (HCC) 09/08/2023   Leukocytosis 09/07/2023   Chronic diastolic CHF (congestive heart failure) (HCC) 09/07/2023   Type 2 diabetes mellitus with hyperlipidemia (HCC) 09/07/2023   Chronic  iron  deficiency anemia 09/07/2023   Acute on chronic combined systolic and diastolic CHF (congestive heart failure) (HCC) 09/07/2023   NSTEMI (non-ST elevated myocardial infarction) (HCC) 09/06/2023   Heme positive stool    Rectal polyp    Adenomatous polyp of transverse colon    Gastritis and gastroduodenitis    Phantom limb pain (HCC) 11/09/2017   Other abnormalities of gait and mobility 11/09/2017   S/P BKA (below knee amputation) bilateral (HCC) 08/20/2015   Dental caries 08/20/2015   Constipation 08/15/2015   Depression 08/15/2015   Pain in shoulder 06/28/2015   Hemiparesis (HCC) 01/10/2013   Tobacco abuse    Paresthesia of right arm and leg 12/17/2011   Facial droop 12/15/2011   TIA (transient ischemic attack) 12/15/2011   Syncope 07/02/2011   Lower urinary tract infectious disease 07/02/2011   Normocytic anemia 07/02/2011   Hereditary and idiopathic peripheral neuropathy 04/02/2009   Essential hypertension 04/02/2009   Coronary artery disease involving native coronary artery of native heart with unstable angina pectoris (HCC) 04/02/2009   HIATAL HERNIA 04/02/2009   Irritable bowel syndrome 04/02/2009   HLD (hyperlipidemia) 05/30/2008   OBESITY 05/30/2008   LEFT VENTRICULAR FUNCTION, DECREASED 05/30/2008   CVA (cerebral infarction) 05/30/2008   GERD 05/30/2008   Home Medication(s) Prior to Admission medications  Medication Sig Start Date End Date Taking? Authorizing Provider  albuterol  (VENTOLIN  HFA) 108 (90 Base) MCG/ACT inhaler Inhale 2 puffs into the lungs in the morning and at bedtime.    [provider]  aspirin  EC 81 MG tablet Take 81 mg by mouth daily.    [provider]  atorvastatin  (LIPITOR ) 80 MG tablet Take 1 tablet (80 mg total) by mouth daily. 09/17/23 10/17/23  Arrien, Mauricio Daniel, MD  chlorhexidine  (PERIDEX ) 0.12 % solution Use as directed 5 mLs in the mouth or throat 2 (two) times daily.    [provider]  cholecalciferol  (VITAMIN D3) 25 MCG (1000 UNIT) tablet Take 50 mcg by mouth daily.    [provider]  clopidogrel  (PLAVIX ) 75 MG tablet Take 1 tablet (75 mg total) by mouth daily. 09/17/23   Arrien, Mauricio Daniel, MD  Dulaglutide 3 MG/0.5ML SOAJ Inject 4.5 mg into the skin. Every Wednesday    [provider]  ergocalciferol (VITAMIN D2) 1.25 MG (50000 UT) capsule Take 50,000 Units by mouth once a week.    [provider]  ferrous sulfate  325 (65 FE) MG tablet Take 325 mg by mouth 2 (two) times daily.    [provider]  furosemide  (LASIX ) 80 MG tablet Take 1 tablet (80 mg total) by mouth daily. 09/17/23   Arrien, Elidia Sieving, MD  hydrALAZINE  (APRESOLINE ) 25 MG tablet Take 1 tablet (25 mg total) by mouth every 8 (eight) hours. 09/16/23   Arrien, Elidia Sieving, MD  insulin  lispro (HUMALOG) 100 UNIT/ML injection Inject 4-14 Units into the skin in the morning and at bedtime. 150-200=4 units,201-250 = 6 units, 251-300 = 8 units, 301-350= 10 units, 351-400= 10 units, 401-450=14 units, 451-500 =12 units >500 = 14 units    [provider]  isosorbide  dinitrate (ISORDIL ) 20 MG tablet Take 1 tablet (20 mg total) by mouth 2 (two) times daily. 09/16/23   Arrien, Elidia Sieving, MD  LANTUS  100 UNIT/ML injection Inject 0.15 mLs (15 Units total) into the skin 2 (two) times daily. 09/16/23   Arrien, Mauricio Daniel, MD  Loperamide HCl (IMODIUM PO) Take 2 mLs by mouth every 8 (eight) hours as needed (diarrhea). 2mg /79ml    [provider]  loratadine (CLARITIN) 10 MG tablet Take 10 mg by mouth daily.    [provider]  magnesium  hydroxide (MILK OF MAGNESIA) 400 MG/5ML suspension Take 30 mLs by mouth daily as needed for mild constipation.    [provider]  metFORMIN  (GLUCOPHAGE ) 500 MG tablet Take 500 mg by mouth daily. 07/13/23   [provider]  metoprolol  succinate (TOPROL -XL) 25 MG 24 hr tablet Take 1.5 tablets (37.5 mg total) by mouth daily.  10/05/23   Daneen Damien BROCKS, NP  nystatin (MYCOSTATIN/NYSTOP) powder Apply 1 Application topically 3 (three) times daily. Apply to skin folds/ R side neck topically threes times a day for candidiasis. Apply to reddened areas on inner elbows and abdominal fold/R side of neck    [provider]  PAZEO 0.7 % SOLN Place 1 drop into both eyes daily.  05/27/18   [provider]  polyethylene glycol (MIRALAX  / GLYCOLAX ) packet Take 17 g by mouth daily as needed for moderate constipation. Patient taking differently: Take 17 g by mouth every other day. 11/04/13   Rizwan, Saima, MD  senna-docusate (SENOKOT-S) 8.6-50 MG tablet Take 1 tablet by mouth daily.    [provider]  Sodium Fluoride 1.1 % PSTE Place 1 Application onto teeth at bedtime.  [provider]  Tetrahydrozoline HCl (VISINE OP) Place 1 drop into both eyes at bedtime.    [provider]  vitamin C  (ASCORBIC ACID) 500 MG tablet Take 500 mg by mouth daily.    [provider]                                                                                                                                    Past Surgical History Past Surgical History:  Procedure Laterality Date   AMPUTATION  12/30/2011   Procedure: AMPUTATION RAY;  Surgeon: Norleen Armor, MD;  Location: [facility] OR;  Service: Orthopedics;  Laterality: Left;  LEFT FIRST RAY AMPUTATION   AMPUTATION  01/13/2012   Procedure: AMPUTATION BELOW KNEE;  Surgeon: Norleen Armor, MD;  Location: [facility] OR;  Service: Orthopedics;  Laterality: Left;   AMPUTATION  03/04/2012   Procedure: AMPUTATION BELOW KNEE;  Surgeon: Norleen Armor, MD;  Location: [facility] OR;  Service: Orthopedics;  Laterality: Left;  Revision of Left Below Knee Amputation   AMPUTATION Right 11/02/2013   Procedure: AMPUTATION BELOW KNEE;  Surgeon: Jerona Harden GAILS, MD;  Location: [facility] OR;  Service: Orthopedics;  Laterality: Right;  Right Below Knee Amputation   BIOPSY  03/29/2019   Procedure: BIOPSY;   Surgeon: San Sandor GAILS, DO;  Location: [facility] ENDOSCOPY;  Service: Gastroenterology;;  EGD and Colon   CARDIAC CATHETERIZATION     LAD 30%, circumflex 50%, OM 75%, RI 60% with small branch 80%, dominant RCA 60%, EF 45-50%   CHOLECYSTECTOMY     COLONOSCOPY     COLONOSCOPY WITH PROPOFOL  N/A 03/29/2019   Procedure: COLONOSCOPY WITH PROPOFOL ;  Surgeon: San Sandor GAILS, DO;  Location: [facility] ENDOSCOPY;  Service: Gastroenterology;  Laterality: N/A;   ESOPHAGOGASTRODUODENOSCOPY (EGD) WITH PROPOFOL  N/A 03/29/2019   Procedure: ESOPHAGOGASTRODUODENOSCOPY (EGD) WITH PROPOFOL ;  Surgeon: San Sandor GAILS, DO;  Location: [facility] ENDOSCOPY;  Service: Gastroenterology;  Laterality: N/A;   EYE SURGERY Right    cataract   I & D EXTREMITY  12/23/2011   Procedure: IRRIGATION AND DEBRIDEMENT EXTREMITY;  Surgeon: Elspeth JONELLE Her, MD;  Location: [facility] OR;  Service: Orthopedics;  Laterality: Left;  Left Foot   I & D EXTREMITY  12/26/2011   Procedure: IRRIGATION AND DEBRIDEMENT EXTREMITY;  Surgeon: Norleen Armor, MD;  Location: [facility] OR;  Service: Orthopedics;  Laterality: Left;  LEFT FOOT I&D WITH POSSIBLE WOUND VAC APPLICATION, POSSIBLE LEFT FIRST RAY AMPUTATION   I & D EXTREMITY Right 10/29/2013   Procedure: IRRIGATION AND DEBRIDEMENT EXTREMITY;  Surgeon: Reyes JAYSON Billing, MD;  Location: [facility] OR;  Service: Orthopedics;  Laterality: Right;   Invasive Electrophysiologic Study  5/09   followed by insertion of an implantable loop recorder. S/p removal    Multiple Toe Surgeries     POLYPECTOMY  03/29/2019   Procedure: POLYPECTOMY;  Surgeon: San Sandor GAILS, DO;  Location: [facility]OS[facility];  Service: Gastroenterology;;   RIGHT/LEFT HEART CATH AND CORONARY  ANGIOGRAPHY N/A 09/07/2023   Procedure: RIGHT/LEFT HEART CATH AND CORONARY ANGIOGRAPHY;  Surgeon: Anner Alm ORN, MD;  Location: [facility] INVASIVE CV LAB;  Service: Cardiovascular;  Laterality: N/A;   TEE WITHOUT CARDIOVERSION N/A 07/07/2012   Procedure: TRANSESOPHAGEAL ECHOCARDIOGRAM (TEE);   Surgeon: Redell GORMAN Shallow, MD;  Location: [facility] ENDOSCOPY;  Service: Cardiovascular;  Laterality: N/A;   Family History Family History  Problem Relation Age of Onset   Pneumonia Mother    Gallbladder disease Mother        cancer   Heart failure Mother    Diabetes Father    Coronary artery disease Father    Stroke Neg Hx     Social History Social History   Tobacco Use   Smoking status: Former    Current packs/day: 0.25    Average packs/day: 0.3 packs/day for 40.0 years (10.0 ttl pk-yrs)    Types: Cigarettes   Smokeless tobacco: Never   Tobacco comments:    has not smoked in about a year  Vaping Use   Vaping status: Never Used  Substance Use Topics   Alcohol use: Yes    Alcohol/week: 4.0 standard drinks of alcohol    Types: 4 Cans of beer per week    Comment: occ- foot ball season- 4 beers a week during football season.   Drug use: No   Allergies Banana, Penicillins, and Strawberry extract  Review of Systems Review of Systems  Physical Exam Vital Signs  I have reviewed the triage vital signs BP 109/65   Pulse 96   Temp 98.2 F (36.8 C) (Oral)   Resp (!) 34   Ht 5' 6 (1.676 m)   Wt 107 kg   SpO2 93%   BMI 38.07 kg/m   Physical Exam Vitals and nursing note reviewed.  Constitutional:      Appearance: She is well-developed.  HENT:     Head: Normocephalic and atraumatic.   Eyes:     Pupils: Pupils are equal, round, and reactive to light.   Pulmonary:     Effort: Tachypnea present.     Breath sounds: Rales present. No decreased breath sounds, wheezing or rhonchi.  Chest:     Chest wall: No mass or tenderness.  Abdominal:     Palpations: Abdomen is soft.   Musculoskeletal:     Comments: BKA bilaterally   Skin:    General: Skin is warm.   Neurological:     General: No focal deficit present.     Mental Status: She is alert.     ED Results and Treatments Labs (all labs ordered are listed, but only abnormal results are displayed) Labs Reviewed   BASIC METABOLIC PANEL WITH GFR - Abnormal; Notable for the following components:      Result Value   CO2 18 (*)    Glucose, Bld 308 (*)    BUN 25 (*)    Creatinine, Ser 1.51 (*)    Calcium  8.5 (*)    GFR, Estimated 37 (*)    Anion gap 18 (*)    All other components within normal limits  CBC WITH DIFFERENTIAL/PLATELET - Abnormal; Notable for the following components:   WBC 24.4 (*)    RDW 19.5 (*)    Neutro Abs 20.6 (*)    Abs Immature Granulocytes 0.29 (*)    All other components within normal limits  BRAIN NATRIURETIC PEPTIDE - Abnormal; Notable for the following components:   B Natriuretic Peptide 527.4 (*)    All other components within  normal limits  CULTURE, BLOOD (ROUTINE X 2)  CULTURE, BLOOD (ROUTINE X 2)  I-STAT CG4 LACTIC ACID, ED  TROPONIN I (HIGH SENSITIVITY)                                                                                                                          Radiology DG Chest Port 1 View Result Date: 11/11/2023 CLINICAL DATA:  Shortness of breath. EXAM: PORTABLE CHEST 1 VIEW COMPARISON:  September 13, 2023 FINDINGS: The cardiac silhouette is enlarged and unchanged in size. Mild to moderate severity calcification of the aortic arch is seen. Low lung volumes are noted. There is mild prominence of the pulmonary vasculature with mild areas of atelectasis and/or infiltrate seen within the bilateral lung bases. No pleural effusion or pneumothorax is identified. Multilevel degenerative changes are seen throughout the thoracic spine. IMPRESSION: 1. Cardiomegaly with mild pulmonary vascular congestion. 2. Mild bibasilar atelectasis and/or infiltrate. Electronically Signed   By: Suzen Dials M.D.   On: 11/11/2023 14:08    Pertinent labs & imaging results that were available during my care of the patient were reviewed by me and considered in my medical decision making (see MDM for details).  Medications Ordered in ED Medications  ceFEPIme (MAXIPIME) 2 g in  sodium chloride  0.9 % 100 mL IVPB (has no administration in time range)  vancomycin  (VANCOREADY) IVPB 2000 mg/400 mL (has no administration in time range)  metoprolol  tartrate (LOPRESSOR ) injection 5 mg (5 mg Intravenous Given 11/11/23 1323)  furosemide  (LASIX ) injection 80 mg (80 mg Intravenous Given 11/11/23 1408)                                                                                                                                     Procedures .Critical Care  Performed by: Mannie Fairy DASEN, DO Authorized by: Mannie Fairy DASEN, DO   Critical care provider statement:    Critical care time (minutes):  33   Critical care was necessary to treat or prevent imminent or life-threatening deterioration of the following conditions:  Respiratory failure   Critical care was time spent personally by me on the following activities:  Blood draw for specimens, development of treatment plan with patient or surrogate, evaluation of patient's response to treatment, examination of patient, ordering and review of radiographic studies and ordering and review of laboratory studies   (including critical care time)  Medical Decision  Making / ED Course   This patient presents to the ED for concern of shortness of breath, this involves an extensive number of treatment options, and is a complaint that carries with it a high risk of complications and morbidity.  The differential diagnosis includes pulmonary edema, CHF exacerbation, pneumonia, less likely ACS, less likely COPD exacerbation.  MDM: Clinically, patient appears fluid overloaded.  I believe that she likely has pulmonary edema.  Will obtain chest x-ray, BNP.  Lower suspicion for ACS, expect patient's troponin to be elevated, however she has not had any chest pain.  She is in A-fib, do of concern for pulmonary embolism.  Patient not anticoagulated.  Rales on auscultation.  Reassessment 2:50 PM-patient's chest x-ray shows some vascular congestion,  possible infiltrate.  Patient with white count, believe she likely has a pneumonia in addition to her pulmonary edema.  Patient still pending CTA, GFR is adequate for CTA per our protocols.  Have started the patient on antibiotics.  This patient may have sepsis.  With the patient's reduced EF, do not believe she requires any additional fluids.  She is looking significantly more comfortable and work of breathing has improved since receiving Lasix . Patient will be signed out to Dr. Randol pending CTA and admission.  Additional history obtained: -Additional history obtained from EMS -External records from outside source obtained and reviewed including: Chart review including previous notes, labs, imaging, consultation notes   Lab Tests: -I ordered, reviewed, and interpreted labs.   The pertinent results include:   Labs Reviewed  BASIC METABOLIC PANEL WITH GFR - Abnormal; Notable for the following components:      Result Value   CO2 18 (*)    Glucose, Bld 308 (*)    BUN 25 (*)    Creatinine, Ser 1.51 (*)    Calcium  8.5 (*)    GFR, Estimated 37 (*)    Anion gap 18 (*)    All other components within normal limits  CBC WITH DIFFERENTIAL/PLATELET - Abnormal; Notable for the following components:   WBC 24.4 (*)    RDW 19.5 (*)    Neutro Abs 20.6 (*)    Abs Immature Granulocytes 0.29 (*)    All other components within normal limits  BRAIN NATRIURETIC PEPTIDE - Abnormal; Notable for the following components:   B Natriuretic Peptide 527.4 (*)    All other components within normal limits  CULTURE, BLOOD (ROUTINE X 2)  CULTURE, BLOOD (ROUTINE X 2)  I-STAT CG4 LACTIC ACID, ED  TROPONIN I (HIGH SENSITIVITY)      EKG atrial fibrillation  EKG Interpretation Date/Time:  Wednesday November 11 2023 13:01:39 EDT Ventricular Rate:  133 PR Interval:    QRS Duration:  96 QT Interval:  344 QTC Calculation: 518 R Axis:   49  Text Interpretation: Atrial fibrillation Anteroseptal infarct, old  Borderline repolarization abnormality Prolonged QT interval Confirmed by Mannie Pac [phone]) on 11/11/2023 1:16:30 PM         Imaging Studies ordered: I ordered imaging studies including chest x-ray I independently visualized and interpreted imaging. I agree with the radiologist interpretation   Medicines ordered and prescription drug management: Meds ordered this encounter  Medications   metoprolol  tartrate (LOPRESSOR ) injection 5 mg   furosemide  (LASIX ) injection 80 mg   ceFEPIme (MAXIPIME) 2 g in sodium chloride  0.9 % 100 mL IVPB    Antibiotic Indication::   HCAP   vancomycin  (VANCOREADY) IVPB 2000 mg/400 mL    Indication::   Sepsis    -  I have reviewed the patients home medicines and have made adjustments as needed  Critical interventions Management of hypoxia   Cardiac Monitoring: The patient was maintained on a cardiac monitor.  I personally viewed and interpreted the cardiac monitored which showed an underlying rhythm of: Atrial fibrillation  Reevaluation: After the interventions noted above, I reevaluated the patient and found that they have :improved  Co morbidities that complicate the patient evaluation  Past Medical History:  Diagnosis Date   Atypical chest pain    a. non obstructive by cath in 2008 and 2010;  b. 02/2012 Myoview : non-ischemic, EF 57%   Cellulitis    a. left foot-> s/p L BKA   Chronic kidney disease    Constipation    CVA (cerebral infarction)    a.  Small right parietal noted incidentally 04/2007;  b. right sided embolic CVA 05/2012;  c. TEE 2/14:  LVH, EF 55-60%, mild LAE, no LAA clot, no PFO, no R->L shunt by echo contrast, oscillating density on AV likely Lambl's Excressence    Diabetes mellitus, type II (HCC)    Diabetic retinopathy (HCC)    NPDR w/edema OU   Diaphragmatic hernia without mention of obstruction or gangrene    Dry skin    Esophageal reflux    HTN (hypertension)    Hx of BKA (HCC)    Bilateral   Hyperlipidemia     Irritable bowel syndrome    Morbid obesity (HCC)    Obesity, unspecified    Pain in shoulder 06/28/2015   Peripheral neuropathy    Stroke [facility])    2013-'no residual'   Syncope and collapse    a. near-syncopal episode in November 2008;  b. 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   Tobacco abuse    Vertigo         Final Clinical Impression(s) / ED Diagnoses Final diagnoses:  SOB (shortness of breath)     @PCDICTATION @    Mannie Pac T, DO 11/11/23 1459    Mannie Pac T, DO 11/11/23 1514

## 2023-11-11 NOTE — Progress Notes (Signed)
 Pharmacy Antibiotic Note  [name] is a 68 y.o. female for which pharmacy has been consulted for cefepime and vancomycin  dosing for pneumonia.  Patient with a history of HF, T2DM, HTN, HLD, anemia . Patient presenting with SOB.  SCr 1.51 -  improved from April WBC 24.4; LA 3.7>5; T 98.2; HR 102>94; RR 33>23  Plan: Cefepime 2g q12hr  Vancomycin  2000 mg once then 1750 mg q48hr (eAUC 515.6) unless change in renal function Monitor WBC, fever, renal function, cultures De-escalate when able Levels at steady state  Height: 5' 6 (167.6 cm) Weight: 107 kg (235 lb 14.3 oz) IBW/kg (Calculated) : 59.3  Temp (24hrs), Avg:98.2 F (36.8 C), Min:98.2 F (36.8 C), Max:98.2 F (36.8 C)  Recent Labs  Lab 11/11/23 1318 11/11/23 1507 11/11/23 1701  WBC 24.4*  --   --   CREATININE 1.51*  --   --   LATICACIDVEN  --  3.7* 5.0*    Estimated Creatinine Clearance: 43.5 mL/min (A) (by C-G formula based on SCr of 1.51 mg/dL (H)).    Allergies  Allergen Reactions   Banana Swelling and Other (See Comments)    Facial swelling   Penicillins Itching, Swelling and Rash    Face swells and break out   Strawberry Extract Swelling and Other (See Comments)    Facial swelling   Microbiology results: Pending  Thank you for allowing pharmacy to be a part of this patient's care.  Dorn Buttner, PharmD, BCPS 11/11/2023 6:55 PM ED Clinical Pharmacist -  [phone]

## 2023-11-11 NOTE — H&P (Signed)
 History and Physical    Patient: [name] FMW:[id number] DOB: Oct 01, 1955 DOA: 11/11/2023 DOS: the patient was seen and examined on 11/11/2023 . PCP: Arnaez Zapata, Gerardo E, MD  Patient coming from: Rehab. Chief complaint: Chief Complaint  Patient presents with   Shortness of Breath   HPI:  [name] is a 68 y.o. female with past medical history  of  chronic diastolic heart failure, type 2 diabetes mellitus complicated by diabetic peripheral polyneuropathy, essential pretension, hyperlipidemia, chronic iron  deficiency anemia with baseline hemoglobin range 9-11, who is coming today on 11/11/2023 presenting from SNF to [facility] ED SOB pt was initially found to be hypotensive.  Patient was last admitted in April of this year for an NSTEMI.  No reports of difficulty with swallowing fevers chills patient is otherwise a limited historian.  There is report and previous note about alcohol 4 drinks per week and no drugs.   ED Course: Pt in ed at bedside  is ill-appearing somewhat dyspneic intermittently oriented.  Nasal cannula at 5 L. Vital signs in the ED were notable for the following:  Vitals:   11/11/23 1644 11/11/23 1700 11/11/23 1715 11/11/23 1745  BP:  104/63 96/65 101/79  Pulse:  95 95 94  Temp: 98.2 F (36.8 C)     Resp:  (!) 29 (!) 26 (!) 23  Height:      Weight:      SpO2:  98% 99% 98%  TempSrc: Oral     BMI (Calculated):      >>ED evaluation thus far shows:            >>While in the ED patient received the following: Medications  vancomycin  (VANCOREADY) IVPB 2000 mg/400 mL (has no administration in time range)  metoprolol  tartrate (LOPRESSOR ) injection 5 mg (5 mg Intravenous Given 11/11/23 1323)  furosemide  (LASIX ) injection 80 mg (80 mg Intravenous Given 11/11/23 1408)  ceFEPIme (MAXIPIME) 2 g in sodium chloride  0.9 % 100 mL IVPB (0 g Intravenous Stopped 11/11/23 1552)  lactated ringers  bolus 500 mL (0 mLs Intravenous Stopped 11/11/23 1633)  iohexol   (OMNIPAQUE ) 350 MG/ML injection 75 mL (75 mLs Intravenous Contrast Given 11/11/23 1541)     Review of Systems  Respiratory:  Positive for cough and shortness of breath.    Past Medical History:  Diagnosis Date   Atypical chest pain    a. non obstructive by cath in 2008 and 2010;  b. 02/2012 Myoview : non-ischemic, EF 57%   Cellulitis    a. left foot-> s/p L BKA   Chronic kidney disease    Constipation    CVA (cerebral infarction)    a.  Small right parietal noted incidentally 04/2007;  b. right sided embolic CVA 05/2012;  c. 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 near-syncopal episode in November 2008;  b. 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   Tobacco abuse    Vertigo    Past Surgical History:  Procedure Laterality Date   AMPUTATION  12/30/2011   Procedure: AMPUTATION RAY;  Surgeon: Norleen Armor, MD;  Location: [facility] OR;  Service: Orthopedics;  Laterality: Left;  LEFT FIRST RAY AMPUTATION   AMPUTATION  01/13/2012   Procedure: AMPUTATION BELOW KNEE;  Surgeon: Norleen Armor, MD;  Location: [facility] OR;  Service: Orthopedics;  Laterality: Left;   AMPUTATION  03/04/2012   Procedure: AMPUTATION BELOW KNEE;  Surgeon: Norleen Armor, MD;  Location: [facility] OR;  Service: Orthopedics;  Laterality: Left;  Revision of Left Below Knee Amputation   AMPUTATION Right 11/02/2013   Procedure: AMPUTATION BELOW KNEE;  Surgeon: Jerona Harden GAILS, MD;  Location: [facility] OR;   Service: Orthopedics;  Laterality: Right;  Right Below Knee Amputation   BIOPSY  03/29/2019   Procedure: BIOPSY;  Surgeon: San Sandor GAILS, DO;  Location: [facility] ENDOSCOPY;  Service: Gastroenterology;;  EGD and Colon   CARDIAC CATHETERIZATION     LAD 30%, circumflex 50%, OM 75%, RI 60% with small branch 80%, dominant RCA 60%, EF 45-50%   CHOLECYSTECTOMY     COLONOSCOPY     COLONOSCOPY WITH PROPOFOL  N/A 03/29/2019   Procedure: COLONOSCOPY WITH PROPOFOL ;  Surgeon: San Sandor GAILS, DO;  Location: [facility] ENDOSCOPY;  Service: Gastroenterology;  Laterality: N/A;   ESOPHAGOGASTRODUODENOSCOPY (EGD) WITH PROPOFOL  N/A 03/29/2019   Procedure: ESOPHAGOGASTRODUODENOSCOPY (EGD) WITH PROPOFOL ;  Surgeon: San Sandor GAILS, DO;  Location: [facility] ENDOSCOPY;  Service: Gastroenterology;  Laterality: N/A;   EYE SURGERY Right    cataract   I & D EXTREMITY  12/23/2011   Procedure: IRRIGATION AND DEBRIDEMENT EXTREMITY;  Surgeon: Elspeth JONELLE Her, MD;  Location: [facility] OR;  Service: Orthopedics;  Laterality: Left;  Left Foot   I & D EXTREMITY  12/26/2011   Procedure: IRRIGATION AND DEBRIDEMENT EXTREMITY;  Surgeon: Norleen Armor, MD;  Location: [facility] OR;  Service: Orthopedics;  Laterality: Left;  LEFT FOOT I&D WITH POSSIBLE WOUND VAC APPLICATION, POSSIBLE LEFT FIRST RAY AMPUTATION   I & D EXTREMITY Right 10/29/2013   Procedure: IRRIGATION AND DEBRIDEMENT EXTREMITY;  Surgeon: Reyes JAYSON Billing, MD;  Location: [facility] OR;  Service: Orthopedics;  Laterality: Right;   Invasive Electrophysiologic Study  5/09   followed by insertion of an implantable loop recorder. S/p removal    Multiple Toe Surgeries     POLYPECTOMY  03/29/2019   Procedure: POLYPECTOMY;  Surgeon: San Sandor GAILS, DO;  Location: [facility] ENDOSCOPY;  Service: Gastroenterology;;   RIGHT/LEFT HEART CATH AND CORONARY ANGIOGRAPHY N/A 09/07/2023   Procedure: RIGHT/LEFT HEART CATH AND CORONARY ANGIOGRAPHY;  Surgeon: Anner Alm ORN, MD;  Location: [facility] INVASIVE CV LAB;  Service:  Cardiovascular;  Laterality: N/A;   TEE WITHOUT CARDIOVERSION N/A 07/07/2012   Procedure: TRANSESOPHAGEAL ECHOCARDIOGRAM (TEE);  Surgeon: Redell GORMAN Shallow, MD;  Location: [facility] ENDOSCOPY;  Service: Cardiovascular;  Laterality: N/A;    reports that she has quit smoking. Her smoking use included cigarettes. She has a 10 pack-year smoking history. She has never used smokeless tobacco. She reports current alcohol use of about 4.0 standard drinks of alcohol per week. She reports that she does not use drugs. Allergies  Allergen Reactions   Banana Swelling and Other (See Comments)    Facial swelling   Penicillins Itching, Swelling and Rash    Face swells and break out   Strawberry Extract Swelling and Other (See Comments)  Facial swelling   Family History  Problem Relation Age of Onset   Pneumonia Mother    Gallbladder disease Mother        cancer   Heart failure Mother    Diabetes Father    Coronary artery disease Father    Stroke Neg Hx    Prior to Admission medications   Medication Sig Start Date End Date Taking? Authorizing Provider  albuterol  (VENTOLIN  HFA) 108 (90 Base) MCG/ACT inhaler Inhale 2 puffs into the lungs in the morning and at bedtime.    [provider]  aspirin  EC 81 MG tablet Take 81 mg by mouth daily.    [provider]  atorvastatin  (LIPITOR ) 80 MG tablet Take 1 tablet (80 mg total) by mouth daily. 09/17/23 10/17/23  Arrien, Mauricio Daniel, MD  chlorhexidine  (PERIDEX ) 0.12 % solution Use as directed 5 mLs in the mouth or throat 2 (two) times daily.    [provider]  cholecalciferol (VITAMIN D3) 25 MCG (1000 UNIT) tablet Take 50 mcg by mouth daily.    [provider]  clopidogrel  (PLAVIX ) 75 MG tablet Take 1 tablet (75 mg total) by mouth daily. 09/17/23   Arrien, Mauricio Daniel, MD  Dulaglutide 3 MG/0.5ML SOAJ Inject 4.5 mg into the skin. Every Wednesday    [provider]  ergocalciferol (VITAMIN D2) 1.25 MG (50000 UT) capsule  Take 50,000 Units by mouth once a week.    [provider]  ferrous sulfate  325 (65 FE) MG tablet Take 325 mg by mouth 2 (two) times daily.    [provider]  furosemide  (LASIX ) 80 MG tablet Take 1 tablet (80 mg total) by mouth daily. 09/17/23   Arrien, Mauricio Daniel, MD  hydrALAZINE  (APRESOLINE ) 25 MG tablet Take 1 tablet (25 mg total) by mouth every 8 (eight) hours. 09/16/23   Arrien, Elidia Sieving, MD  insulin  lispro (HUMALOG) 100 UNIT/ML injection Inject 4-14 Units into the skin in the morning and at bedtime. 150-200=4 units,201-250 = 6 units, 251-300 = 8 units, 301-350= 10 units, 351-400= 10 units, 401-450=14 units, 451-500 =12 units >500 = 14 units    [provider]  isosorbide  dinitrate (ISORDIL ) 20 MG tablet Take 1 tablet (20 mg total) by mouth 2 (two) times daily. 09/16/23   Arrien, Elidia Sieving, MD  LANTUS  100 UNIT/ML injection Inject 0.15 mLs (15 Units total) into the skin 2 (two) times daily. 09/16/23   Arrien, Mauricio Daniel, MD  Loperamide HCl (IMODIUM PO) Take 2 mLs by mouth every 8 (eight) hours as needed (diarrhea). 2mg /21ml    [provider]  loratadine (CLARITIN) 10 MG tablet Take 10 mg by mouth daily.    [provider]  magnesium  hydroxide (MILK OF MAGNESIA) 400 MG/5ML suspension Take 30 mLs by mouth daily as needed for mild constipation.    [provider]  metFORMIN  (GLUCOPHAGE ) 500 MG tablet Take 500 mg by mouth daily. 07/13/23   [provider]  metoprolol  succinate (TOPROL -XL) 25 MG 24 hr tablet Take 1.5 tablets (37.5 mg total) by mouth daily. 10/05/23   Daneen Damien BROCKS, NP  nystatin (MYCOSTATIN/NYSTOP) powder Apply 1 Application topically 3 (three) times daily. Apply to skin folds/ R side neck topically threes times a day for candidiasis. Apply to reddened areas on inner elbows and abdominal fold/R side of neck    [provider]  PAZEO 0.7 % SOLN Place 1 drop into both eyes daily.  05/27/18   [provider]  polyethylene glycol (MIRALAX  /  GLYCOLAX ) packet Take 17 g by mouth daily as needed for moderate constipation. Patient taking differently: Take 17 g by mouth every other day. 11/04/13   Rizwan, Saima, MD  senna-docusate (SENOKOT-S) 8.6-50 MG tablet Take 1 tablet by mouth daily.    [provider]  Sodium Fluoride 1.1 % PSTE Place 1 Application onto teeth at bedtime.    [provider]  Tetrahydrozoline HCl (VISINE OP) Place 1 drop into both eyes at bedtime.    [provider]  vitamin C  (ASCORBIC ACID) 500 MG tablet Take 500 mg by mouth daily.    [provider]                                                                                 Vitals:   11/11/23 1644 11/11/23 1700 11/11/23 1715 11/11/23 1745  BP:  104/63 96/65 101/79  Pulse:  95 95 94  Resp:  (!) 29 (!) 26 (!) 23  Temp: 98.2 F (36.8 C)     TempSrc: Oral     SpO2:  98% 99% 98%  Weight:      Height:       Physical Exam Vitals and nursing note reviewed.  Constitutional:      General: She is not in acute distress.    Appearance: She is obese. She is ill-appearing.     Interventions: Nasal cannula in place.  HENT:     Head: Normocephalic and atraumatic.     Right Ear: Hearing normal.     Left Ear: Hearing normal.     Nose: No nasal deformity.     Mouth/Throat:     Lips: Pink.   Eyes:     General: Lids are normal.     Extraocular Movements: Extraocular movements intact.    Cardiovascular:     Rate and Rhythm: Normal rate and regular rhythm.     Heart sounds: Normal heart sounds.  Pulmonary:     Effort: Pulmonary effort is normal.     Breath sounds: Examination of the right-upper field reveals wheezing. Examination of the left-upper field reveals wheezing. Examination of the right-middle field reveals rales. Examination of the left-middle field reveals rales. Examination of the right-lower field reveals rales. Examination of the left-lower field reveals rales.  Wheezing and rales present.  Abdominal:     General: Bowel sounds are normal. There is no distension.     Palpations: Abdomen is soft. There is no mass.     Tenderness: There is no abdominal tenderness.   Musculoskeletal:     Right lower leg: No edema.     Left lower leg: No edema.     Amputation Right Lower Extremity: Right leg is amputated below knee.     Amputation Left Lower Extremity: Left leg is amputated below knee.   Skin:    General: Skin is warm.   Neurological:     General: No focal deficit present.     Mental Status: She is alert and oriented to person, place, and time.     Cranial Nerves: Cranial nerves 2-12 are intact.   Psychiatric:        Speech: Speech normal.     Labs  on Admission: I have personally reviewed following labs and imaging studies CBC: Recent Labs  Lab 11/11/23 1318  WBC 24.4*  NEUTROABS 20.6*  HGB 12.3  HCT 40.8  MCV 86.4  PLT 327   Basic Metabolic Panel: Recent Labs  Lab 11/11/23 1318  NA 139  K 3.7  CL 103  CO2 18*  GLUCOSE 308*  BUN 25*  CREATININE 1.51*  CALCIUM  8.5*   GFR: Estimated Creatinine Clearance: 43.5 mL/min (A) (by C-G formula based on SCr of 1.51 mg/dL (H)). Liver Function Tests: No results for input(s): AST, ALT, ALKPHOS, BILITOT, PROT, ALBUMIN  in the last 168 hours. No results for input(s): LIPASE, AMYLASE in the last 168 hours. No results for input(s): AMMONIA in the last 168 hours. Coagulation Profile: No results for input(s): INR, PROTIME in the last 168 hours. Cardiac Enzymes: No results for input(s): CKTOTAL, CKMB, CKMBINDEX, TROPONINI in the last 168 hours. BNP (last 3 results) No results for input(s): PROBNP in the last 8760 hours. HbA1C: No results for input(s): HGBA1C in the last 72 hours. CBG: No results for input(s): GLUCAP in the last 168 hours. Lipid Profile: No results for input(s): CHOL, HDL, LDLCALC, TRIG, CHOLHDL, LDLDIRECT in the last 72  hours. Thyroid  Function Tests: No results for input(s): TSH, T4TOTAL, FREET4, T3FREE, THYROIDAB in the last 72 hours. Anemia Panel: No results for input(s): VITAMINB12, FOLATE, FERRITIN, TIBC, IRON , RETICCTPCT in the last 72 hours. Urine analysis:    Component Value Date/Time   COLORURINE YELLOW 09/07/2023 2023   APPEARANCEUR CLOUDY (A) 09/07/2023 2023   LABSPEC 1.034 (H) 09/07/2023 2023   PHURINE 8.0 09/07/2023 2023   GLUCOSEU NEGATIVE 09/07/2023 2023   HGBUR SMALL (A) 09/07/2023 2023   BILIRUBINUR NEGATIVE 09/07/2023 2023   KETONESUR NEGATIVE 09/07/2023 2023   PROTEINUR 100 (A) 09/07/2023 2023   UROBILINOGEN 0.2 12/31/2014 1928   NITRITE NEGATIVE 09/07/2023 2023   LEUKOCYTESUR LARGE (A) 09/07/2023 2023   Radiological Exams on Admission: CT Angio Chest PE W and/or Wo Contrast Result Date: 11/11/2023 CLINICAL DATA:  Shortness of breath. EXAM: CT ANGIOGRAPHY CHEST WITH CONTRAST TECHNIQUE: Multidetector CT imaging of the chest was performed using the standard protocol during bolus administration of intravenous contrast. Multiplanar CT image reconstructions and MIPs were obtained to evaluate the vascular anatomy. RADIATION DOSE REDUCTION: This exam was performed according to the departmental dose-optimization program which includes automated exposure control, adjustment of the mA and/or kV according to patient size and/or use of iterative reconstruction technique. CONTRAST:  75mL OMNIPAQUE  IOHEXOL  350 MG/ML SOLN COMPARISON:  09/06/2023.  Portable chest obtained earlier today. FINDINGS: Cardiovascular: Stable enlarged heart. No pericardial effusion. Atheromatous calcifications, including the coronary arteries and aorta. Normally opacified pulmonary arteries with no pulmonary arterial filling defects seen. Mediastinum/Nodes: The previously demonstrated 16 mm right hilar lymph node currently has a short axis diameter of 16.7 mm on image number 58/4. A previously demonstrated  enlarged subcarinal node has a short axis diameter of 13 mm on image number 69/4, previously 14 mm. Unremarkable thyroid  gland, trachea and esophagus. Lungs/Pleura: Extensive patchy and ground-glass opacity in both lungs with progression. Small to moderate-sized right pleural effusion and small left pleural effusion, both increased. Upper Abdomen: Atheromatous arterial calcifications. 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 Review of the MIP images confirms the above findings. IMPRESSION: 1. No pulmonary emboli. 2. Extensive patchy and ground-glass opacity in both lungs with progression, compatible with worsening pulmonary edema and possible pneumonia. 3. Small  to moderate-sized right pleural effusion and small left pleural effusion, both increased. 4. Stable cardiomegaly. 5.  Calcific coronary artery and aortic atherosclerosis. 6. Stable right hilar and subcarinal adenopathy, nonspecific. Aortic Atherosclerosis (ICD10-I70.0). Electronically Signed   By: Elspeth Bathe M.D.   On: 11/11/2023 15:55   DG Chest Port 1 View Result Date: 11/11/2023 CLINICAL DATA:  Shortness of breath. EXAM: PORTABLE CHEST 1 VIEW COMPARISON:  September 13, 2023 FINDINGS: The cardiac silhouette is enlarged and unchanged in size. Mild to moderate severity calcification of the aortic arch is seen. Low lung volumes are noted. There is mild prominence of the pulmonary vasculature with mild areas of atelectasis and/or infiltrate seen within the bilateral lung bases. No pleural effusion or pneumothorax is identified. Multilevel degenerative changes are seen throughout the thoracic spine. IMPRESSION: 1. Cardiomegaly with mild pulmonary vascular congestion. 2. Mild bibasilar atelectasis and/or infiltrate. Electronically Signed   By: Suzen Dials M.D.   On: 11/11/2023 14:08   Data Reviewed: Relevant notes from primary care and specialist visits, past discharge summaries  as available in EHR, including Care Everywhere . Prior diagnostic testing as pertinent to current admission diagnoses, Updated medications and problem lists for reconciliation .ED course, including vitals, labs, imaging, treatment and response to treatment,Triage notes, nursing and pharmacy notes and ED provider's notes.Notable results as noted in HPI.Discussed case with EDMD/ ED APP/ or Specialty MD on call and as needed.  Assessment & Plan  >> SOB on 5 L [place] with acute hypoxic respiratory failure/  Sepsis 2/2 CAP and or  aspiration PNA: -Admit to progressive unit.  -VBG is pending.  Procalcitonin ordered and pending. - Will continue patient on vancomycin /cefepime added Flagyl  for anaerobic coverage. -Lactic acidosis most probably from sepsis/ low BP and metformin .  SpO2: 98 % O2 Flow Rate (L/min): 8 L/min    >> NSTEMI/CAD:  11/11/23 15:05 11/11/23 16:55  Troponin I (High Sensitivity) 1,347 (HH) 2,159 (HH)  -Patient was started on heparin  gtt Will hold patient's aspirin  and cont Plavix . -Repeat troponin is pending. -Appreciate cardiology consult and management. -Continue patient on her statin therapy.    >> Chronic iron  deficiency anemia:    Latest Ref Rng & Units 11/11/2023    1:18 PM 09/13/2023    5:37 AM 09/10/2023    4:15 AM  CBC  WBC 4.0 - 10.5 K/uL 24.4  16.8  12.2   Hemoglobin 12.0 - 15.0 g/dL 87.6  89.1  9.5   Hematocrit 36.0 - 46.0 % 40.8  34.9  30.6   Platelets 150 - 400 K/uL 327  280  229   Currently stable.   >>Acute on C/H diastolic heart failure: - Patient received Lasix  80 mg in the emergency room x 1. - Will continue with 40 mg every 12 hours for 2 doses. - Strict I's and O's Daily weights. -No intake/output data recorded. No intake/output data recorded.   >> A-fib with RVR:  Currently sinus. Patient started on heparin  IV per cardiology recommendations.   >>DM II: Glycemic protocol.  Carb consistent diet.    >>Essential Htn: Vitals:   11/11/23 1300  11/11/23 1415 11/11/23 1515 11/11/23 1530  BP: 126/79 109/65 110/62 (!) 111/95   11/11/23 1700 11/11/23 1715 11/11/23 1745  BP: 104/63 96/65 101/79  Due to low bp will hold all BP meds I.E: hydralazine / imdur/ toprol  /    DVT prophylaxis:  Heparin  gtt.  Consults:  Cardiology.  Advance Care Planning:    Code Status: Full Code   Family Communication:  None.  Disposition Plan:  Rehab.  Severity of Illness: The appropriate patient status for this patient is INPATIENT. Inpatient status is judged to be reasonable and necessary in order to provide the required intensity of service to ensure the patient's safety. The patient's presenting symptoms, physical exam findings, and initial radiographic and laboratory data in the context of their chronic comorbidities is felt to place them at high risk for further clinical deterioration. Furthermore, it is not anticipated that the patient will be medically stable for discharge from the hospital within 2 midnights of admission.   * I certify that at the point of admission it is my clinical judgment that the patient will require inpatient hospital care spanning beyond 2 midnights from the point of admission due to high intensity of service, high risk for further deterioration and high frequency of surveillance required.*  Unresulted Labs (From admission, onward)     Start     Ordered   11/13/23 0500  Heparin  level (unfractionated)  Daily,   R      11/11/23 1900   11/12/23 0500  Comprehensive metabolic panel  Tomorrow morning,   R        11/11/23 1843   11/12/23 0500  CBC  Tomorrow morning,   R        11/11/23 1843   11/12/23 0300  Heparin  level (unfractionated)  Once-Timed,   TIMED        11/11/23 1900   11/11/23 1919  MRSA Next Gen by PCR, Nasal  (MRSA Screening)  Once,   R        11/11/23 1918   11/11/23 1851  Ethanol  Add-on,   AD        11/11/23 1850   11/11/23 1840  HIV Antibody (routine testing w rflx)  (HIV Antibody (Routine testing w  reflex) panel)  Once,   R        11/11/23 1843   11/11/23 1720  Procalcitonin  Add-on,   AD       References:    Procalcitonin Lower Respiratory Tract Infection AND Sepsis Procalcitonin Algorithm   11/11/23 1719   11/11/23 1418  Blood culture (routine x 2)  BLOOD CULTURE X 2,   R      11/11/23 1418           Meds ordered this encounter  Medications   metoprolol  tartrate (LOPRESSOR ) injection 5 mg   furosemide  (LASIX ) injection 80 mg   ceFEPIme (MAXIPIME) 2 g in sodium chloride  0.9 % 100 mL IVPB    Antibiotic Indication::   HCAP   vancomycin  (VANCOREADY) IVPB 2000 mg/400 mL    Indication::   Sepsis   lactated ringers  bolus 500 mL   iohexol  (OMNIPAQUE ) 350 MG/ML injection 75 mL   sodium chloride  flush (NS) 0.9 % injection 3 mL   OR Linked Order Group    acetaminophen  (TYLENOL ) tablet 650 mg    acetaminophen  (TYLENOL ) suppository 650 mg   morphine  (PF) 2 MG/ML injection 2 mg   OR Linked Order Group    ondansetron  (ZOFRAN ) tablet 4 mg    ondansetron  (ZOFRAN ) injection 4 mg   furosemide  (LASIX ) injection 40 mg   insulin  aspart (novoLOG ) injection 0-9 Units    Correction coverage::   Sensitive (thin, NPO, renal)    CBG < 70::   Implement Hypoglycemia Standing Orders and refer to Hypoglycemia Standing Orders sidebar report    CBG 70 - 120::   0 units    CBG 121 -  150::   1 unit    CBG 151 - 200::   2 units    CBG 201 - 250::   3 units    CBG 251 - 300::   5 units    CBG 301 - 350::   7 units    CBG 351 - 400:   9 units    CBG > 400:   call MD and obtain STAT lab verification   metroNIDAZOLE  (FLAGYL ) IVPB 500 mg    Antibiotic Indication::   Other Indication (list below)    Other Indication::   pna   heparin  bolus via infusion 4,000 Units   heparin  ADULT infusion 100 units/mL (25000 units/250mL)   vancomycin  (VANCOREADY) IVPB 1750 mg/350 mL    Indication::   Pneumonia   ceFEPIme (MAXIPIME) 2 g in sodium chloride  0.9 % 100 mL IVPB    Antibiotic Indication::   HCAP    atorvastatin  (LIPITOR ) tablet 80 mg   clopidogrel  (PLAVIX ) tablet 75 mg   Orders Placed This Encounter  Procedures   Critical Care   Blood culture (routine x 2)   MRSA Next Gen by PCR, Nasal   DG Chest Port 1 View   CT Angio Chest PE W and/or Wo Contrast   Basic metabolic panel   CBC with Differential   Brain natriuretic peptide   Procalcitonin   HIV Antibody (routine testing w rflx)   Comprehensive metabolic panel   CBC   Ethanol   Heparin  level (unfractionated)   Heparin  level (unfractionated)   Diet heart healthy/carb modified Room service appropriate? Yes; Fluid consistency: Thin; Fluid restriction: 1500 mL Fluid   ED Cardiac monitoring   Apply Diabetes Mellitus Care Plan   STAT CBG when hypoglycemia is suspected. If treated, recheck every 15 minutes after each treatment until CBG >/= 70 mg/dl   Refer to Hypoglycemia Protocol Sidebar Report for treatment of CBG < 70 mg/dl   Cardiac Monitoring Continuous x 24 hours Indications for use: Sub-acute heart failure   Maintain IV access   Vital signs   Notify physician (specify)   Mobility Protocol: No Restrictions RN to initiate protocols based on patient's level of care   Refer to Sidebar Report Refer to ICU, Med-Surg, Progressive, and Step-Down Mobility Protocol Sidebars   Initiate Adult Central Line Maintenance and Catheter Protocol for patients with central line (CVC, PICC, Port, Hemodialysis, Trialysis)   If patient diabetic or glucose greater than 140 notify physician for Sliding Scale Insulin  Orders   Do not place and if present remove PureWick   Initiate Oral Care Protocol   Initiate Carrier Fluid Protocol   RN may order General Admission PRN Orders utilizing General Admission PRN medications (through manage orders) for the following patient needs: allergy symptoms (Claritin), cold sores (Carmex), cough (Robitussin DM), eye irritation (Liquifilm Tears), hemorrhoids (Tucks), indigestion (Maalox), minor skin irritation  (Hydrocortisone Cream), muscle pain (Ben Gay), nose irritation (saline nasal spray) and sore throat (Chloraseptic spray).   Notify physician (specify)   Initiate Heart Failure Care Plan   Daily weights   Strict intake and output   In and Out Cath   Patient Education:   Apply Heart Failure Care Plan   [facility] and AP only) Obtain REDS clips reading Every morning   Full code   Consult for [facility] Medical Admission   Inpatient consult to Cardiology   Nutritional services consult   Consult to Transition of Care Team   Consult to Heart Failure Navigation Team [facility], [place], and [facility])  vancomycin  per pharmacy consult   CeFEPIme (MAXIPIME) per pharmacy consult            heparin  per pharmacy consult   OT eval and treat   PT eval and treat   ED Pulse oximetry, continuous   Pulse oximetry check with vital signs   Oxygen therapy Mode or (Route): Nasal cannula; Liters Per Minute: 2; Keep O2 saturation between: greater than 92 %   Incentive spirometry   I-Stat CG4 Lactic Acid   I-Stat CG4 Lactic Acid   EKG 12-Lead   ED EKG   EKG 12-Lead   Insert peripheral IV   Insert peripheral IV   Admit to Inpatient (patient's expected length of stay will be greater than 2 midnights or inpatient only procedure)   Aspiration precautions   Fall precautions    Author: Mario LULLA Blanch, MD 12 pm- 8 pm. [facility]. 11/11/2023 7:32 PM Please note for any communication after hours contact [facility] Assigned provider on call on Amion.

## 2023-11-11 NOTE — Consult Note (Signed)
 Cardiology Consultation   Patient ID: [name] MRN: [id number]; DOB: 08-28-1955  Admit date: 11/11/2023 Date of Consult: 11/11/2023  PCP:  Arnaez Zapata, Gerardo E, MD   [facility] Providers Cardiologist:  Peter Swaziland, MD        Patient Profile: [name] is a 68 y.o. female with a hx of ischemic cardiomyopathy who is being seen 11/11/2023 for the evaluation of non-STEMI and heart failure at the request of Dr. Randol.  History of Present Illness: 68 year old female with a history of severe multivessel CAD, chronic combined systolic and diastolic heart failure, mitral valve regurgitation, PVCs, pulmonary hypertension, hypertension, hyperlipidemia, CKD stage IIIa, chronic iron  deficiency anemia, type 2 diabetes, s/p BKA and CVA who presents with shortness of breath.  She lives in an assisted care facility.  She was hospitalized in April of this year for non-STEMI, acute on chronic systolic and diastolic heart failure.  She underwent cardiac catheterization revealing multivessel CAD thought not to be a good CABG candidate or PCI candidate.  She does have severe LV dysfunction with an EF in the 20 to 25% range, mild to moderate MR and moderate pulmonary hypertension.  She was discharged home to a skilled nursing facility on/30/25 and was seen as an outpatient for follow-up on 10/05/2023.  She was admitted with shortness of breath.  Her workup has revealed elevated troponin at 1300 and BNP at 500.  Her lactic acid is elevated at 5 as well.  Her CBC reveals a white count of 24,000.  Chest CT shows patchy infiltrate with question pneumonia plus or minus heart failure.   Past Medical History:  Diagnosis Date   Atypical chest pain    a. non obstructive by cath in 2008 and 2010;  b. 02/2012 Myoview : non-ischemic, EF 57%   Cellulitis    a. left foot-> s/p L BKA   Chronic kidney disease    Constipation    CVA (cerebral infarction)    a.  Small right parietal noted  incidentally 04/2007;  b. right sided embolic CVA 05/2012;  c. TEE 2/14:  LVH, EF 55-60%, mild LAE, no LAA clot, no PFO, no R->L shunt by echo contrast, oscillating density on AV likely Lambl's Excressence    Diabetes mellitus, type II (HCC)    Diabetic retinopathy (HCC)    NPDR w/edema OU   Diaphragmatic hernia without mention of obstruction or gangrene    Dry skin    Esophageal reflux    HTN (hypertension)    Hx of BKA (HCC)    Bilateral   Hyperlipidemia    Irritable bowel syndrome    Morbid obesity (HCC)    Obesity, unspecified    Pain in shoulder 06/28/2015   Peripheral neuropathy    Stroke [facility])    2013-'no residual'   Syncope and collapse    a. near-syncopal episode in November 2008;  b. 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   Tobacco abuse    Vertigo     Past Surgical History:  Procedure Laterality Date   AMPUTATION  12/30/2011   Procedure: AMPUTATION RAY;  Surgeon: Norleen Armor, MD;  Location: [facility] OR;  Service: Orthopedics;  Laterality: Left;  LEFT FIRST RAY AMPUTATION   AMPUTATION  01/13/2012   Procedure: AMPUTATION BELOW KNEE;  Surgeon: Norleen Armor, MD;  Location: [facility] OR;  Service: Orthopedics;  Laterality: Left;   AMPUTATION  03/04/2012   Procedure: AMPUTATION BELOW KNEE;  Surgeon: Norleen Armor, MD;  Location: [facility] OR;  Service: Orthopedics;  Laterality: Left;  Revision of Left Below Knee Amputation   AMPUTATION Right 11/02/2013   Procedure: AMPUTATION BELOW KNEE;  Surgeon: Jerona Harden GAILS, MD;  Location: [facility] OR;  Service: Orthopedics;  Laterality: Right;  Right Below Knee Amputation   BIOPSY  03/29/2019   Procedure: BIOPSY;  Surgeon: San Sandor GAILS, DO;  Location: [facility] ENDOSCOPY;  Service: Gastroenterology;;  EGD and Colon   CARDIAC CATHETERIZATION     LAD 30%, circumflex 50%, OM 75%, RI 60% with small branch 80%, dominant RCA 60%, EF 45-50%   CHOLECYSTECTOMY     COLONOSCOPY     COLONOSCOPY WITH PROPOFOL  N/A 03/29/2019   Procedure: COLONOSCOPY WITH PROPOFOL ;  Surgeon:  San Sandor GAILS, DO;  Location: [facility] ENDOSCOPY;  Service: Gastroenterology;  Laterality: N/A;   ESOPHAGOGASTRODUODENOSCOPY (EGD) WITH PROPOFOL  N/A 03/29/2019   Procedure: ESOPHAGOGASTRODUODENOSCOPY (EGD) WITH PROPOFOL ;  Surgeon: San Sandor GAILS, DO;  Location: [facility] ENDOSCOPY;  Service: Gastroenterology;  Laterality: N/A;   EYE SURGERY Right    cataract   I & D EXTREMITY  12/23/2011   Procedure: IRRIGATION AND DEBRIDEMENT EXTREMITY;  Surgeon: Elspeth JONELLE Her, MD;  Location[facility] OR;  Service: Orthopedics;  Laterality: Left;  Left Foot   I & D EXTREMITY  12/26/2011   Procedure: IRRIGATION AND DEBRIDEMENT EXTREMITY;  Surgeon: Norleen Armor, MD;  Location: [facility] OR;  Service: Orthopedics;  Laterality: Left;  LEFT FOOT I&D WITH POSSIBLE WOUND VAC APPLICATION, POSSIBLE LEFT FIRST RAY AMPUTATION   I & D EXTREMITY Right 10/29/2013   Procedure: IRRIGATION AND DEBRIDEMENT EXTREMITY;  Surgeon: Reyes JAYSON Billing, MD;  Location: [facility] OR;  Service: Orthopedics;  Laterality: Right;   Invasive Electrophysiologic Study  5/09   followed by insertion of an implantable loop recorder. S/p removal    Multiple Toe Surgeries     POLYPECTOMY  03/29/2019   Procedure: POLYPECTOMY;  Surgeon: San Sandor GAILS, DO;  Location: [facility] ENDOSCOPY;  Service: Gastroenterology;;   RIGHT/LEFT HEART CATH AND CORONARY ANGIOGRAPHY N/A 09/07/2023   Procedure: RIGHT/LEFT HEART CATH AND CORONARY ANGIOGRAPHY;  Surgeon: Anner Alm ORN, MD;  Location: [facility] INVASIVE CV LAB;  Service: Cardiovascular;  Laterality: N/A;   TEE WITHOUT CARDIOVERSION N/A 07/07/2012   Procedure: TRANSESOPHAGEAL ECHOCARDIOGRAM (TEE);  Surgeon: Redell GORMAN Shallow, MD;  Location: [facility] ENDOSCOPY;  Service: Cardiovascular;  Laterality: N/A;     Home Medications:  Prior to Admission medications   Medication Sig Start Date End Date Taking? Authorizing Provider  albuterol  (VENTOLIN  HFA) 108 (90 Base) MCG/ACT inhaler Inhale 2 puffs into the lungs in the morning and at bedtime.    [provider]  aspirin  EC 81 MG tablet Take 81 mg by mouth daily.    [provider]  atorvastatin  (LIPITOR ) 80 MG tablet Take 1 tablet (80 mg total) by mouth daily. 09/17/23 10/17/23  Arrien, Mauricio Daniel, MD  chlorhexidine  (PERIDEX ) 0.12 % solution Use as directed 5 mLs in the mouth or throat 2 (two) times daily.    [provider]  cholecalciferol (VITAMIN D3) 25 MCG (1000 UNIT) tablet Take 50 mcg by mouth daily.    [provider]  clopidogrel  (PLAVIX ) 75 MG tablet Take 1 tablet (75 mg total) by mouth daily. 09/17/23   Arrien, Mauricio Daniel, MD  Dulaglutide 3 MG/0.5ML SOAJ Inject 4.5 mg into the skin. Every Wednesday    [provider]  ergocalciferol (VITAMIN D2) 1.25 MG (50000 UT) capsule Take 50,000 Units by mouth once a week.    [provider]  ferrous sulfate  325 (65 FE)  MG tablet Take 325 mg by mouth 2 (two) times daily.    [provider]  furosemide  (LASIX ) 80 MG tablet Take 1 tablet (80 mg total) by mouth daily. 09/17/23   Arrien, Mauricio Daniel, MD  hydrALAZINE  (APRESOLINE ) 25 MG tablet Take 1 tablet (25 mg total) by mouth every 8 (eight) hours. 09/16/23   Arrien, Elidia Sieving, MD  insulin  lispro (HUMALOG) 100 UNIT/ML injection Inject 4-14 Units into the skin in the morning and at bedtime. 150-200=4 units,201-250 = 6 units, 251-300 = 8 units, 301-350= 10 units, 351-400= 10 units, 401-450=14 units, 451-500 =12 units >500 = 14 units    [provider]  isosorbide  dinitrate (ISORDIL ) 20 MG tablet Take 1 tablet (20 mg total) by mouth 2 (two) times daily. 09/16/23   Arrien, Mauricio Daniel, MD  LANTUS  100 UNIT/ML injection Inject 0.15 mLs (15 Units total) into the skin 2 (two) times daily. 09/16/23   Arrien, Mauricio Daniel, MD  Loperamide HCl (IMODIUM PO) Take 2 mLs by mouth every 8 (eight) hours as needed (diarrhea). 2mg /35ml    [provider]  loratadine (CLARITIN) 10 MG tablet Take 10 mg by mouth daily.    [provider]  magnesium  hydroxide (MILK OF MAGNESIA) 400 MG/5ML suspension Take 30 mLs by mouth daily as needed for mild constipation.    [provider]  metFORMIN  (GLUCOPHAGE ) 500 MG tablet Take 500 mg by mouth daily. 07/13/23   [provider]  metoprolol  succinate (TOPROL -XL) 25 MG 24 hr tablet Take 1.5 tablets (37.5 mg total) by mouth daily. 10/05/23   Daneen Damien BROCKS, NP  nystatin (MYCOSTATIN/NYSTOP) powder Apply 1 Application topically 3 (three) times daily. Apply to skin folds/ R side neck topically threes times a day for candidiasis. Apply to reddened areas on inner elbows and abdominal fold/R side of neck    [provider]  PAZEO 0.7 % SOLN Place 1 drop into both eyes daily.  05/27/18   [provider]  polyethylene glycol (MIRALAX  / GLYCOLAX ) packet Take 17 g by mouth daily as needed for moderate constipation. Patient taking differently: Take 17 g by mouth every other day. 11/04/13   Rizwan, Saima, MD  senna-docusate (SENOKOT-S) 8.6-50 MG tablet Take 1 tablet by mouth daily.    [provider]  Sodium Fluoride 1.1 % PSTE Place 1 Application onto teeth at bedtime.    [provider]  Tetrahydrozoline HCl (VISINE OP) Place 1 drop into both eyes at bedtime.    [provider]  vitamin C  (ASCORBIC ACID) 500 MG tablet Take 500 mg by mouth daily.    [provider]    Scheduled Meds:  aflibercept   2 mg Intravitreal    Bevacizumab   1.25 mg Intravitreal    Bevacizumab   1.25 mg Intravitreal    Bevacizumab   1.25 mg Intravitreal    Bevacizumab   1.25 mg Intravitreal    Continuous Infusions:  vancomycin  2,000 mg (11/11/23 1725)   PRN Meds: aflibercept , Bevacizumab , Bevacizumab , Bevacizumab , Bevacizumab   Allergies:    Allergies  Allergen Reactions   Banana Swelling and Other (See Comments)    Facial swelling   Penicillins Itching, Swelling and Rash    Face swells and break out   Strawberry Extract Swelling and Other  (See Comments)    Facial swelling    Social History:   Social History   Socioeconomic History   Marital status: Single    Spouse name: Not on file   Number of children: Not on file  Years of education: Not on file   Highest education level: Not on file  Occupational History   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   Drug use: No   Sexual activity: Not Currently    Birth control/protection: Post-menopausal  Other Topics Concern   Not on file  Social History Narrative   Lives at [facility] since 12/19/14     Does not routinely exercise.     works as Pension scheme manager.    >25 pack year history.    FULL CODE   Social Drivers of Corporate investment banker Strain: Not on file  Food Insecurity: No Food Insecurity (09/08/2023)   Hunger Vital Sign    Worried About Running Out of Food in the Last Year: Never true    Ran Out of Food in the Last Year: Never true  Transportation Needs: No Transportation Needs (09/08/2023)   PRAPARE - Administrator, Civil Service (Medical): No    Lack of Transportation (Non-Medical): No  Physical Activity: Not on file  Stress: Not on file  Social Connections: Unknown (09/06/2023)   Social Connection and Isolation Panel    Frequency of Communication with Friends and Family: More than three times a week    Frequency of Social Gatherings with Friends and Family: More than three times a week    Attends Religious Services: More than 4  times per year    Active Member of [employer] or Organizations: No    Attends Banker Meetings: 1 to 4 times per year    Marital Status: Patient declined  Intimate Partner Violence: Not At Risk (09/08/2023)   Humiliation, Afraid, Rape, and Kick questionnaire    Fear of Current or Ex-Partner: No    Emotionally Abused: No    Physically Abused: No    Sexually Abused: No    Family History:    Family History  Problem Relation Age of Onset   Pneumonia Mother    Gallbladder disease Mother        cancer   Heart failure Mother    Diabetes Father    Coronary artery disease Father    Stroke Neg Hx      ROS:  Please see the history of present illness.   All other ROS reviewed and negative.     Physical Exam/Data: Vitals:   11/11/23 1644 11/11/23 1700 11/11/23 1715 11/11/23 1745  BP:  104/63 96/65 101/79  Pulse:  95 95 94  Resp:  (!) 29 (!) 26 (!) 23  Temp: 98.2 F (36.8 C)     TempSrc: Oral     SpO2:  98% 99% 98%  Weight:      Height:       No intake or output data in the 24 hours ending 11/11/23 1803    11/11/2023   12:55 PM 09/16/2023   12:51 AM 09/15/2023  5:04 AM  Last 3 Weights  Weight (lbs) 235 lb 14.3 oz 234 lb 3.8 oz 234 lb 9.1 oz  Weight (kg) 107 kg 106.25 kg 106.4 kg     Body mass index is 38.07 kg/m.  General:  Well nourished, well developed, in no acute distress HEENT: normal Neck: no JVD Vascular: No carotid bruits; Distal pulses 2+ bilaterally Cardiac:  normal S1, S2; RRR; no murmur  Lungs: Bilateral rales and wheezes Abd: soft, nontender, no hepatomegaly  Ext: Bilateral BKA Musculoskeletal:  No deformities, BUE and BLE strength normal and equal Skin: warm and dry  Neuro:  CNs 2-12 intact, no focal abnormalities noted Psych:  Normal affect   EKG:  The EKG was personally reviewed and demonstrates: A-fib RVR  Telemetry:  Telemetry was personally reviewed and demonstrates: Sinus rhythm  Relevant CV Studies: None  Laboratory Data: High  Sensitivity Troponin:   Recent Labs  Lab 11/11/23 1505  TROPONINIHS 1,347*     Chemistry Recent Labs  Lab 11/11/23 1318  NA 139  K 3.7  CL 103  CO2 18*  GLUCOSE 308*  BUN 25*  CREATININE 1.51*  CALCIUM  8.5*  GFRNONAA 37*  ANIONGAP 18*    No results for input(s): PROT, ALBUMIN , AST, ALT, ALKPHOS, BILITOT in the last 168 hours. Lipids No results for input(s): CHOL, TRIG, HDL, LABVLDL, LDLCALC, CHOLHDL in the last 168 hours.  Hematology Recent Labs  Lab 11/11/23 1318  WBC 24.4*  RBC 4.72  HGB 12.3  HCT 40.8  MCV 86.4  MCH 26.1  MCHC 30.1  RDW 19.5*  PLT 327   Thyroid  No results for input(s): TSH, FREET4 in the last 168 hours.  BNP Recent Labs  Lab 11/11/23 1308  BNP 527.4*    DDimer No results for input(s): DDIMER in the last 168 hours.  Radiology/Studies:  CT Angio Chest PE W and/or Wo Contrast Result Date: 11/11/2023 CLINICAL DATA:  Shortness of breath. EXAM: CT ANGIOGRAPHY CHEST WITH CONTRAST TECHNIQUE: Multidetector CT imaging of the chest was performed using the standard protocol during bolus administration of intravenous contrast. Multiplanar CT image reconstructions and MIPs were obtained to evaluate the vascular anatomy. RADIATION DOSE REDUCTION: This exam was performed according to the departmental dose-optimization program which includes automated exposure control, adjustment of the mA and/or kV according to patient size and/or use of iterative reconstruction technique. CONTRAST:  75mL OMNIPAQUE  IOHEXOL  350 MG/ML SOLN COMPARISON:  09/06/2023.  Portable chest obtained earlier today. FINDINGS: Cardiovascular: Stable enlarged heart. No pericardial effusion. Atheromatous calcifications, including the coronary arteries and aorta. Normally opacified pulmonary arteries with no pulmonary arterial filling defects seen. Mediastinum/Nodes: The previously demonstrated 16 mm right hilar lymph node currently has a short axis diameter of 16.7 mm  on image number 58/4. A previously demonstrated enlarged subcarinal node has a short axis diameter of 13 mm on image number 69/4, previously 14 mm. Unremarkable thyroid  gland, trachea and esophagus. Lungs/Pleura: Extensive patchy and ground-glass opacity in both lungs with progression. Small to moderate-sized right pleural effusion and small left pleural effusion, both increased. Upper Abdomen: Atheromatous arterial calcifications. 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 Review of the MIP images confirms the above findings. IMPRESSION: 1. No pulmonary emboli. 2. Extensive patchy and ground-glass opacity in both lungs with progression, compatible with worsening pulmonary edema and possible pneumonia. 3. Small to moderate-sized right pleural effusion and small left pleural effusion, both increased. 4. Stable cardiomegaly. 5.  Calcific coronary artery and  aortic atherosclerosis. 6. Stable right hilar and subcarinal adenopathy, nonspecific. Aortic Atherosclerosis (ICD10-I70.0). Electronically Signed   By: Elspeth Bathe M.D.   On: 11/11/2023 15:55   DG Chest Port 1 View Result Date: 11/11/2023 CLINICAL DATA:  Shortness of breath. EXAM: PORTABLE CHEST 1 VIEW COMPARISON:  September 13, 2023 FINDINGS: The cardiac silhouette is enlarged and unchanged in size. Mild to moderate severity calcification of the aortic arch is seen. Low lung volumes are noted. There is mild prominence of the pulmonary vasculature with mild areas of atelectasis and/or infiltrate seen within the bilateral lung bases. No pleural effusion or pneumothorax is identified. Multilevel degenerative changes are seen throughout the thoracic spine. IMPRESSION: 1. Cardiomegaly with mild pulmonary vascular congestion. 2. Mild bibasilar atelectasis and/or infiltrate. Electronically Signed   By: Suzen Dials M.D.   On: 11/11/2023 14:08     Assessment and Plan: CAD-multivessel CAD by  cath in April of this year not amenable to CABG or PCI.  I believe her elevated troponin is probably related to demand ischemia from respiratory insufficiency.  No further workup required. Combined systolic and diastolic heart failure-2D echo performed 09/07/23 revealed EF of 20%, grade 2 diastolic dysfunction with mild to moderate MR.  Her chest CT does suggest volume overload.  Her proBNP is elevated at 1300.  I suspect that she does have an element of heart failure.  She has moderate renal insufficiency.  She would benefit from IV diuresis. Pneumonia-lactate is 5.  White count is 24,000.  CT potentially significant for community-acquired pneumonia.  I suspect that her presentation is related to this.  Treatment with antibiotics per primary service. PAF-presenting EKG did show A-fib with RVR.  Currently in sinus rhythm.  Recommend IV heparin  for now.    Risk Assessment/Risk Scores:      New York  Heart Association (NYHA) Functional Class NYHA Class III  CHA2DS2-VASc Score =     This indicates a  % annual risk of stroke. The patient's score is based upon:          For questions or updates, please contact [facility] Please consult [email or web address] for contact info under    Signed, Dorn Lesches, MD  11/11/2023 6:03 PM

## 2023-11-11 NOTE — ED Notes (Signed)
Awaiting vancomycin from main pharmacy

## 2023-11-11 NOTE — Progress Notes (Signed)
 ED Pharmacy Antibiotic Sign Off An antibiotic consult was received from an ED provider for vancomycin  per pharmacy dosing for sepsis. A chart review was completed to assess appropriateness.   The following one time order(s) were placed:  Vancomycin  2g  Further antibiotic and/or antibiotic pharmacy consults should be ordered by the admitting provider if indicated.   Thank you for allowing pharmacy to be a part of this patient's care.   Leonor GORMAN Bash, [facility]  Clinical Pharmacist 11/11/23 2:28 PM

## 2023-11-11 NOTE — ED Notes (Signed)
 Phlebotomy to collect second set of blood cultures with lactic.

## 2023-11-11 NOTE — ED Notes (Signed)
 Placed purewick on patient

## 2023-11-11 NOTE — ED Provider Notes (Signed)
 Patient initially seen by Dr. Mannie.  Please see his note.  Patient presented with shortness of breath.  Felt to be in CHF exacerbation with possible component of pneumonia.  CT angiogram was pending at the time of his shift change.  Labs notable for elevated lactic acid level as well as BNP and troponin.  Patient CT angiogram does not show PE but suggested possible pneumonia CHF.  Vital signs are stable.  Will plan on admission to the hospital for treatment  Case discussed with hospitalist service and cardiology.   Randol Simmonds, MD 11/11/23 [phone]

## 2023-11-11 NOTE — ED Notes (Signed)
 Phlebotomy at bedside collecting second set of blood cultures, lactic and trop.

## 2023-11-11 NOTE — Progress Notes (Signed)
 ANTICOAGULATION CONSULT NOTE  Pharmacy Consult for Heparin  Indication: chest pain/ACS and atrial fibrillation  Allergies  Allergen Reactions   Banana Swelling and Other (See Comments)    Facial swelling   Penicillins Itching, Swelling and Rash    Face swells and break out   Strawberry Extract Swelling and Other (See Comments)    Facial swelling    Patient Measurements: Height: 5' 6 (167.6 cm) Weight: 107 kg (235 lb 14.3 oz) IBW/kg (Calculated) : 59.3 Heparin  Dosing Weight: 84 kg  Vital Signs: Temp: 98.2 F (36.8 C) (06/25 1644) Temp Source: Oral (06/25 1644) BP: 101/79 (06/25 1745) Pulse Rate: 94 (06/25 1745)  Labs: Recent Labs    11/11/23 1318 11/11/23 1505 11/11/23 1655  HGB 12.3  --   --   HCT 40.8  --   --   PLT 327  --   --   CREATININE 1.51*  --   --   TROPONINIHS  --  1,347* 2,159*    Estimated Creatinine Clearance: 43.5 mL/min (A) (by C-G formula based on SCr of 1.51 mg/dL (H)).   Medical History: Past Medical History:  Diagnosis Date   Atypical chest pain    a. non obstructive by cath in 2008 and 2010;  b. 02/2012 Myoview : non-ischemic, EF 57%   Cellulitis    a. left foot-> s/p L BKA   Chronic kidney disease    Constipation    CVA (cerebral infarction)    a.  Small right parietal noted incidentally 04/2007;  b. right sided embolic CVA 05/2012;  c. 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  near-syncopal episode in November 2008;  b. 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   Tobacco abuse    Vertigo     Medications:  (Not in a hospital admission)  Scheduled:   aflibercept   2 mg Intravitreal    Bevacizumab   1.25 mg Intravitreal    Bevacizumab   1.25 mg Intravitreal    Bevacizumab   1.25 mg Intravitreal    Bevacizumab   1.25 mg Intravitreal    [START ON 11/12/2023] furosemide   40 mg Intravenous Q12H   [START ON 11/12/2023] insulin  aspart  0-9 Units Subcutaneous TID WC   sodium chloride  flush  3 mL Intravenous Q12H   Infusions:   metronidazole      vancomycin  2,000 mg (11/11/23 1725)   PRN: acetaminophen  **OR** acetaminophen , aflibercept , Bevacizumab , Bevacizumab , Bevacizumab , Bevacizumab , morphine  injection, ondansetron  **OR** ondansetron  (ZOFRAN ) IV  Assessment: 51 yof with a history of HF, T2DM, HTN, HLD, anemia. Patient is presenting with SOB. Heparin  per pharmacy consult placed for chest pain/ACS and atrial fibrillation.  Patient is not on anticoagulation prior to arrival.  Hgb 12.3; plt 327 hsTrop 1347>2159  Goal of Therapy:  Heparin  level 0.3-0.7 units/ml Monitor platelets by anticoagulation protocol: Yes  Plan:  Give IV heparin  4000 units bolus x 1 Start heparin  infusion at 1200 units/hr Check anti-Xa level in 8 hours and daily while on heparin  Continue to monitor H&H and platelets  Dorn Buttner, PharmD, BCPS 11/11/2023 6:56 PM ED Clinical Pharmacist -  [phone]

## 2023-11-11 NOTE — ED Triage Notes (Signed)
 Pt BIB [facility] from [facility] due to shortness of breath.  SpO2 70's RA.  Pt given 2 duonebs.  Initial HR 130's.

## 2023-11-12 ENCOUNTER — Inpatient Hospital Stay (HOSPITAL_COMMUNITY)

## 2023-11-12 DIAGNOSIS — A419 Sepsis, unspecified organism: Secondary | ICD-10-CM

## 2023-11-12 DIAGNOSIS — I255 Ischemic cardiomyopathy: Secondary | ICD-10-CM | POA: Diagnosis not present

## 2023-11-12 DIAGNOSIS — R6520 Severe sepsis without septic shock: Secondary | ICD-10-CM | POA: Diagnosis not present

## 2023-11-12 DIAGNOSIS — I214 Non-ST elevation (NSTEMI) myocardial infarction: Secondary | ICD-10-CM | POA: Diagnosis not present

## 2023-11-12 DIAGNOSIS — I2510 Atherosclerotic heart disease of native coronary artery without angina pectoris: Secondary | ICD-10-CM | POA: Diagnosis not present

## 2023-11-12 DIAGNOSIS — I480 Paroxysmal atrial fibrillation: Secondary | ICD-10-CM

## 2023-11-12 LAB — BLOOD GAS, ARTERIAL
Acid-base deficit: 5.8 mmol/L — ABNORMAL HIGH (ref 0.0–2.0)
Bicarbonate: 18.5 mmol/L — ABNORMAL LOW (ref 20.0–28.0)
Drawn by: 336832
O2 Saturation: 100 %
Patient temperature: 37
pCO2 arterial: 32 mmHg (ref 32–48)
pH, Arterial: 7.37 (ref 7.35–7.45)
pO2, Arterial: 132 mmHg — ABNORMAL HIGH (ref 83–108)

## 2023-11-12 LAB — COMPREHENSIVE METABOLIC PANEL WITH GFR
ALT: 16 U/L (ref 0–44)
AST: 39 U/L (ref 15–41)
Albumin: 2.6 g/dL — ABNORMAL LOW (ref 3.5–5.0)
Alkaline Phosphatase: 103 U/L (ref 38–126)
Anion gap: 12 (ref 5–15)
BUN: 30 mg/dL — ABNORMAL HIGH (ref 8–23)
CO2: 20 mmol/L — ABNORMAL LOW (ref 22–32)
Calcium: 7.7 mg/dL — ABNORMAL LOW (ref 8.9–10.3)
Chloride: 106 mmol/L (ref 98–111)
Creatinine, Ser: 1.73 mg/dL — ABNORMAL HIGH (ref 0.44–1.00)
GFR, Estimated: 32 mL/min — ABNORMAL LOW (ref >60)
Glucose, Bld: 138 mg/dL — ABNORMAL HIGH (ref 70–99)
Potassium: 3.8 mmol/L (ref 3.5–5.1)
Sodium: 138 mmol/L (ref 135–145)
Total Bilirubin: 0.6 mg/dL (ref 0.0–1.2)
Total Protein: 6.4 g/dL — ABNORMAL LOW (ref 6.5–8.1)

## 2023-11-12 LAB — GLUCOSE, CAPILLARY
Glucose-Capillary: 208 mg/dL — ABNORMAL HIGH (ref 70–99)
Glucose-Capillary: 210 mg/dL — ABNORMAL HIGH (ref 70–99)

## 2023-11-12 LAB — CBC
HCT: 33.2 % — ABNORMAL LOW (ref 36.0–46.0)
Hemoglobin: 9.6 g/dL — ABNORMAL LOW (ref 12.0–15.0)
MCH: 25.7 pg — ABNORMAL LOW (ref 26.0–34.0)
MCHC: 28.9 g/dL — ABNORMAL LOW (ref 30.0–36.0)
MCV: 88.8 fL (ref 80.0–100.0)
Platelets: 220 10*3/uL (ref 150–400)
RBC: 3.74 MIL/uL — ABNORMAL LOW (ref 3.87–5.11)
RDW: 19.6 % — ABNORMAL HIGH (ref 11.5–15.5)
WBC: 14.7 10*3/uL — ABNORMAL HIGH (ref 4.0–10.5)
nRBC: 0 % (ref 0.0–0.2)

## 2023-11-12 LAB — LACTIC ACID, PLASMA: Lactic Acid, Venous: 1.6 mmol/L (ref 0.5–1.9)

## 2023-11-12 LAB — CULTURE, BLOOD (ROUTINE X 2)

## 2023-11-12 LAB — HEPARIN LEVEL (UNFRACTIONATED): Heparin Unfractionated: 0.38 [IU]/mL (ref 0.30–0.70)

## 2023-11-12 LAB — CBG MONITORING, ED
Glucose-Capillary: 134 mg/dL — ABNORMAL HIGH (ref 70–99)
Glucose-Capillary: 150 mg/dL — ABNORMAL HIGH (ref 70–99)

## 2023-11-12 MED ORDER — FUROSEMIDE 10 MG/ML IJ SOLN
INTRAMUSCULAR | Status: AC
  Filled 2023-11-12: qty 8

## 2023-11-12 MED ORDER — FUROSEMIDE 10 MG/ML IJ SOLN
80.0000 mg | Freq: Two times a day (BID) | INTRAMUSCULAR | Status: DC
  Administered 2023-11-12: 80 mg via INTRAVENOUS

## 2023-11-12 MED ORDER — ALBUTEROL SULFATE (2.5 MG/3ML) 0.083% IN NEBU
2.5000 mg | INHALATION_SOLUTION | Freq: Three times a day (TID) | RESPIRATORY_TRACT | Status: DC
  Administered 2023-11-12 (×3): 2.5 mg via RESPIRATORY_TRACT
  Filled 2023-11-12 (×2): qty 3

## 2023-11-12 MED ORDER — ALBUTEROL SULFATE (2.5 MG/3ML) 0.083% IN NEBU
2.5000 mg | INHALATION_SOLUTION | Freq: Once | RESPIRATORY_TRACT | Status: AC
  Administered 2023-11-12: 2.5 mg via RESPIRATORY_TRACT
  Filled 2023-11-12: qty 3

## 2023-11-12 MED ORDER — IPRATROPIUM-ALBUTEROL 0.5-2.5 (3) MG/3ML IN SOLN
3.0000 mL | RESPIRATORY_TRACT | Status: DC | PRN
  Administered 2023-11-12 – 2023-11-23 (×6): 3 mL via RESPIRATORY_TRACT
  Filled 2023-11-12 (×5): qty 3

## 2023-11-12 MED ORDER — MIDODRINE HCL 5 MG PO TABS
5.0000 mg | ORAL_TABLET | Freq: Once | ORAL | Status: AC
  Administered 2023-11-12: 5 mg via ORAL
  Filled 2023-11-12: qty 1

## 2023-11-12 MED ORDER — SODIUM CHLORIDE 3 % IN NEBU
4.0000 mL | INHALATION_SOLUTION | Freq: Every day | RESPIRATORY_TRACT | Status: DC
  Administered 2023-11-12 – 2023-11-13 (×2): 4 mL via RESPIRATORY_TRACT
  Filled 2023-11-12 (×3): qty 4

## 2023-11-12 NOTE — Progress Notes (Signed)
 Rounding Note   Patient Name: [name] Date of Encounter: 11/12/2023  [facility] Cardiologist: Peter Swaziland, MD   Subjective Clinically improved this morning.  Says her breathing is better.  Denies chest pain.  Scheduled Meds:  aflibercept   2 mg Intravitreal    albuterol   2.5 mg Nebulization Once   atorvastatin   80 mg Oral Daily   clopidogrel   75 mg Oral Daily   furosemide   40 mg Intravenous Q12H   insulin  aspart  0-9 Units Subcutaneous TID WC   pantoprazole  40 mg Oral Daily   sodium chloride  flush  3 mL Intravenous Q12H   thiamine  100 mg Oral Daily   Continuous Infusions:  ceFEPime (MAXIPIME) IV Stopped (11/12/23 0558)   heparin  1,200 Units/hr (11/11/23 2236)   [START ON 11/13/2023] vancomycin      PRN Meds: acetaminophen  **OR** acetaminophen , aflibercept , morphine  injection, ondansetron  **OR** ondansetron  (ZOFRAN ) IV   Vital Signs  Vitals:   11/12/23 0402 11/12/23 0500 11/12/23 0800 11/12/23 0804  BP: 117/62 123/66 126/68   Pulse: 82 78 84   Resp: (!) 23 (!) 22 20   Temp: 97.7 F (36.5 C)   97.7 F (36.5 C)  TempSrc: Oral   Oral  SpO2: 100% 100% 100%   Weight:      Height:        Intake/Output Summary (Last 24 hours) at 11/12/2023 0819 Last data filed at 11/12/2023 0814 Gross per 24 hour  Intake 190.96 ml  Output --  Net 190.96 ml      11/11/2023   12:55 PM 09/16/2023   12:51 AM 09/15/2023    5:04 AM  Last 3 Weights  Weight (lbs) 235 lb 14.3 oz 234 lb 3.8 oz 234 lb 9.1 oz  Weight (kg) 107 kg 106.25 kg 106.4 kg      Telemetry Sinus rhythm- Personally Reviewed  ECG  None performed today- Personally Reviewed  Physical Exam  GEN: No acute distress.   Neck: No JVD Cardiac: RRR, no murmurs, rubs, or gallops.  Respiratory: Clear to auscultation bilaterally. GI: Soft, nontender, non-distended  MS: No edema; No deformity. Neuro:  Nonfocal  Psych: Normal affect   Labs High Sensitivity Troponin:   Recent Labs  Lab 11/11/23 1505  11/11/23 1655  TROPONINIHS 1,347* 2,159*     Chemistry Recent Labs  Lab 11/11/23 1318 11/11/23 2031 11/12/23 0500  NA 139 139 138  K 3.7 3.7 3.8  CL 103  --  106  CO2 18*  --  20*  GLUCOSE 308*  --  138*  BUN 25*  --  30*  CREATININE 1.51*  --  1.73*  CALCIUM  8.5*  --  7.7*  PROT  --   --  6.4*  ALBUMIN   --   --  2.6*  AST  --   --  39  ALT  --   --  16  ALKPHOS  --   --  103  BILITOT  --   --  0.6  GFRNONAA 37*  --  32*  ANIONGAP 18*  --  12    Lipids No results for input(s): CHOL, TRIG, HDL, LABVLDL, LDLCALC, CHOLHDL in the last 168 hours.  Hematology Recent Labs  Lab 11/11/23 1318 11/11/23 2031 11/12/23 0500  WBC 24.4*  --  14.7*  RBC 4.72  --  3.74*  HGB 12.3 10.9* 9.6*  HCT 40.8 32.0* 33.2*  MCV 86.4  --  88.8  MCH 26.1  --  25.7*  MCHC 30.1  --  28.9*  RDW 19.5*  --  19.6*  PLT 327  --  220   Thyroid  No results for input(s): TSH, FREET4 in the last 168 hours.  BNP Recent Labs  Lab 11/11/23 1308  BNP 527.4*    DDimer No results for input(s): DDIMER in the last 168 hours.   Radiology  CT Angio Chest PE W and/or Wo Contrast Result Date: 11/11/2023 CLINICAL DATA:  Shortness of breath. EXAM: CT ANGIOGRAPHY CHEST WITH CONTRAST TECHNIQUE: Multidetector CT imaging of the chest was performed using the standard protocol during bolus administration of intravenous contrast. Multiplanar CT image reconstructions and MIPs were obtained to evaluate the vascular anatomy. RADIATION DOSE REDUCTION: This exam was performed according to the departmental dose-optimization program which includes automated exposure control, adjustment of the mA and/or kV according to patient size and/or use of iterative reconstruction technique. CONTRAST:  75mL OMNIPAQUE  IOHEXOL  350 MG/ML SOLN COMPARISON:  09/06/2023.  Portable chest obtained earlier today. FINDINGS: Cardiovascular: Stable enlarged heart. No pericardial effusion. Atheromatous calcifications, including the  coronary arteries and aorta. Normally opacified pulmonary arteries with no pulmonary arterial filling defects seen. Mediastinum/Nodes: The previously demonstrated 16 mm right hilar lymph node currently has a short axis diameter of 16.7 mm on image number 58/4. A previously demonstrated enlarged subcarinal node has a short axis diameter of 13 mm on image number 69/4, previously 14 mm. Unremarkable thyroid  gland, trachea and esophagus. Lungs/Pleura: Extensive patchy and ground-glass opacity in both lungs with progression. Small to moderate-sized right pleural effusion and small left pleural effusion, both increased. Upper Abdomen: Atheromatous arterial calcifications. 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 Review of the MIP images confirms the above findings. IMPRESSION: 1. No pulmonary emboli. 2. Extensive patchy and ground-glass opacity in both lungs with progression, compatible with worsening pulmonary edema and possible pneumonia. 3. Small to moderate-sized right pleural effusion and small left pleural effusion, both increased. 4. Stable cardiomegaly. 5.  Calcific coronary artery and aortic atherosclerosis. 6. Stable right hilar and subcarinal adenopathy, nonspecific. Aortic Atherosclerosis (ICD10-I70.0). Electronically Signed   By: Elspeth Bathe M.D.   On: 11/11/2023 15:55   DG Chest Port 1 View Result Date: 11/11/2023 CLINICAL DATA:  Shortness of breath. EXAM: PORTABLE CHEST 1 VIEW COMPARISON:  September 13, 2023 FINDINGS: The cardiac silhouette is enlarged and unchanged in size. Mild to moderate severity calcification of the aortic arch is seen. Low lung volumes are noted. There is mild prominence of the pulmonary vasculature with mild areas of atelectasis and/or infiltrate seen within the bilateral lung bases. No pleural effusion or pneumothorax is identified. Multilevel degenerative changes are seen throughout the thoracic spine.  IMPRESSION: 1. Cardiomegaly with mild pulmonary vascular congestion. 2. Mild bibasilar atelectasis and/or infiltrate. Electronically Signed   By: Suzen Dials M.D.   On: 11/11/2023 14:08    Cardiac Studies None  Patient Profile   FAHMIDA JURICH is a 68 y.o. female with a hx of ischemic cardiomyopathy who is being seen 11/11/2023 for the evaluation of non-STEMI and heart failure at the request of Dr. Randol.   Assessment & Plan  1: CAD-history of multivessel disease status post cardiac catheterization in April revealing three-vessel disease to be a CABG candidate.  She denies chest pain.  EKG shows no acute changes.  Her troponins did go up to 2100 probably related to demand ischemia as a result of her pneumonia/sepsis.  She currently is on IV heparin .  No further workup for this  is required.  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  BNP elevated at 1300.  She is getting IV furosemide .  Not sure I/O's are accurate.  Her lungs sound better and she is satting 100%.  She feels clinically improved.  I would continue to diurese.  Renal function has remained stable.  3: Pneumonia/sepsis-lactic acid declining from 5.0-3.5.  She is on antibiotics.  Her white count is coming down as well.  Her blood pressures in the 120 range.  Continue current therapy per primary service.  4: PAF-initial EKG showed A-fib with RVR.  Currently in sinus rhythm.  On IV heparin .  I suspect her A-fib was related to her pulmonary/infectious process.  I do not think she will require a DOAC at discharge.  Patient appears clinically improved on current therapy.  Will probably require 1 more day of IV.  Smilingly counts switched back to p.o.  Remainder of her hospitalization per primary team.  Will sign off but will be available for questions.  Will arrange for outpatient follow-up with Dr. Swaziland.  [facility] will sign off.   The patient is not ready for discharge   from a cardiac  standpoint. Medication Recommendations: No changes Other recommendations (labs, testing, etc): None Follow up as an outpatient: Dr. Swaziland For questions or updates, please contact [facility] Please consult [email or web address] for contact info under     Signed, Dorn Lesches, MD  11/12/2023, 8:19 AM

## 2023-11-12 NOTE — Assessment & Plan Note (Addendum)
 Admitted on diuretics.  Cardiology consulted, recommended against LHC/RHC, recommended ongoing diuretics.  Creatinine now trending up, diuretics stopped - Hold diuretics - Hold antihypertensives - IV fluids

## 2023-11-12 NOTE — Plan of Care (Signed)
   Problem: Education: Goal: Ability to describe self-care measures that may prevent or decrease complications (Diabetes Survival Skills Education) will improve Outcome: Progressing   Problem: Coping: Goal: Ability to adjust to condition or change in health will improve Outcome: Progressing   Problem: Fluid Volume: Goal: Ability to maintain a balanced intake and output will improve Outcome: Progressing

## 2023-11-12 NOTE — Hospital Course (Addendum)
 68 y.o. F with obesity, sCHF EF 25%, recent NSTEMI treated medically, DM and hx bilat BKA, HTN, CKD IIIa baseline 1.2 and hx stroke without residual who presented with SOB.  Recently admitted for NSTEMI/CHF, underwent LHC that showed severe calcific disease, 80% mid-LAD, 90% prox RCA and 90% ramus intermedius, no PCI due to technically challenging targets, not CABG candidate.  At 5/19 OV, was feeling well, but in the interim, got worse, time course unclear, and was sent back from SNF with SOB.   In the ER, CXR showed bilateral opacities. BP normal but lactate >5.  Started on antibiotics, diuretics, Cardiology consulted.  ECG with new atrial fibrillation.

## 2023-11-12 NOTE — Assessment & Plan Note (Addendum)
 Hemoglobin trending down in the setting of renal failure phlebotomy. - Recheck iron  stores

## 2023-11-12 NOTE — Assessment & Plan Note (Addendum)
 Remains somewhat hyperglycemic - Hold metformin , GLP-1 - Continue low dose glargine - Continue sliding scale corrections, renal function worsening will defer changing for now

## 2023-11-12 NOTE — Plan of Care (Signed)

## 2023-11-12 NOTE — Assessment & Plan Note (Addendum)
 Baseline creatinine appears to be around 1.2 until her recent admission for CHF and NSTEMI, at which time it peaked at greater than 2.5  This admission, has developed oliguric renal failure, likely ischemic ATN in the setting of sepsis, vancomycin , hypotension on 6/27 and diuresis for CHF.   FeNA >2%, UA with leukocytes.  Renal US  unremarkable.  UOP low.  Gave fluids and albumin  yesterday but developed increased WOB so this was stopped - Consult nephrology - Monitor UOP - Continue midodrine 

## 2023-11-12 NOTE — Progress Notes (Signed)
 Called to room for rapid response.  On moving to bed, patient with increased WOB, desaturation to 60%.    CXR, ABG, nebs ordered.  Saturating 100% on 6L on my arrival, RT and RR team at bedside.

## 2023-11-12 NOTE — ED Notes (Signed)
Pt set up to eat lunch.

## 2023-11-12 NOTE — Progress Notes (Signed)
   11/12/23 2219  BiPAP/CPAP/SIPAP  $ Non-Invasive Ventilator  Non-Invasive Vent Subsequent  BiPAP/CPAP/SIPAP Pt Type Adult  BiPAP/CPAP/SIPAP SERVO  Mask Type Full face mask  Mask Size Large  PEEP 8 cmH20  FiO2 (%) 40 %  Minute Ventilation 14.9  Leak 51  Peak Inspiratory Pressure (PIP) 18  Tidal Volume (Vt) 536  Patient Home Machine No  Patient Home Mask No  Patient Home Tubing No  Auto Titrate No  BiPAP/CPAP /SiPAP Vitals  Pulse Rate (!) 54  Resp (!) 28  Bilateral Breath Sounds Diminished  MEWS Score/Color  MEWS Score 3  MEWS Score Color Yellow

## 2023-11-12 NOTE — Progress Notes (Signed)
 MD notified of patient being in respiratory distress as soon as patient was transferred to this unit. Patient is tachycardic with a HR of 122; oxygen at 99% on 2L but is desating, using abdominal muscles to breathe and stating that she can't breathe. Called rapid response nurse to come assess the patient as well. MD put in orders for a neb treatment, ABG lab and STAT chest x-ray. MD also on the way to bedside to assess this patient.

## 2023-11-12 NOTE — Assessment & Plan Note (Addendum)
 Acute respiratory failure with hypoxia Presented with tachycardia, tachypnea, leukocytosis, and respiratory failure with lactic acid greater than 5.  Chest x-ray shows bilateral infiltrates.  Procalcitonin 0.6. VBG showing respiratory alkalosis, tachypnea to 30s, requiring 8L.  No prior history COPD or asthma MRSA nares negative.  Vanc stopped. - Continue cefepime , day 5 of 7 - Bronchodilators - Aggressive pulmonary toilet - Continue midodrine 

## 2023-11-12 NOTE — ED Notes (Signed)
 CCMD called and notified

## 2023-11-12 NOTE — Progress Notes (Signed)
 OT Cancellation Note  Patient Details Name: [name] MRN: [id number] DOB: 03-25-1956   Cancelled Treatment:    Reason Eval/Treat Not Completed: Medical issues which prohibited therapy Medical issues which prohibited therapy - elevating troponin levels, now SOB. OT to check back tomorrow to see if medically appropriate.   Mliss Fish 11/12/2023, 10:57 AM

## 2023-11-12 NOTE — Progress Notes (Signed)
 ANTICOAGULATION CONSULT NOTE  Pharmacy Consult for Heparin  Indication: chest pain/ACS and atrial fibrillation  Allergies  Allergen Reactions   Banana Swelling and Other (See Comments)    Facial swelling   Penicillins Itching, Swelling and Rash    Face swells and break out   Strawberry Extract Swelling and Other (See Comments)    Facial swelling    Patient Measurements: Height: 5' 6 (167.6 cm) Weight: 107 kg (235 lb 14.3 oz) IBW/kg (Calculated) : 59.3 Heparin  Dosing Weight: 84 kg  Vital Signs: Temp: 97.7 F (36.5 C) (06/26 0402) Temp Source: Oral (06/26 0402) BP: 123/66 (06/26 0500) Pulse Rate: 78 (06/26 0500)  Labs: Recent Labs    11/11/23 1318 11/11/23 1505 11/11/23 1655 11/11/23 2031 11/12/23 0438 11/12/23 0500  HGB 12.3  --   --  10.9*  --  9.6*  HCT 40.8  --   --  32.0*  --  33.2*  PLT 327  --   --   --   --  220  HEPARINUNFRC  --   --   --   --  0.38  --   CREATININE 1.51*  --   --   --   --  1.73*  TROPONINIHS  --  1,347* 2,159*  --   --   --     Estimated Creatinine Clearance: 38 mL/min (A) (by C-G formula based on SCr of 1.73 mg/dL (H)).   Medical History: Past Medical History:  Diagnosis Date   Atypical chest pain    a. non obstructive by cath in 2008 and 2010;  b. 02/2012 Myoview : non-ischemic, EF 57%   Cellulitis    a. left foot-> s/p L BKA   Chronic kidney disease    Constipation    CVA (cerebral infarction)    a.  Small right parietal noted incidentally 04/2007;  b. right sided embolic CVA 05/2012;  c. TEE 2/14:  LVH, EF 55-60%, mild LAE, no LAA clot, no PFO, no R->L shunt by echo contrast, oscillating density on AV likely Lambl's Excressence    Diabetes mellitus, type II (HCC)    Diabetic retinopathy (HCC)    NPDR w/edema OU   Diaphragmatic hernia without mention of obstruction or gangrene    Dry skin    Esophageal reflux    HTN (hypertension)    Hx of BKA (HCC)    Bilateral   Hyperlipidemia    Irritable bowel syndrome    Morbid  obesity (HCC)    Obesity, unspecified    Pain in shoulder 06/28/2015   Peripheral neuropathy    Stroke [facility])    2013-'no residual'   Syncope and collapse    a. near-syncopal episode in November 2008;  b. 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   Tobacco abuse    Vertigo       Assessment: 54 yof with a history of HF, T2DM, HTN, HLD, anemia. Patient is presenting with SOB. No anticoagulation prior to admission. Heparin  per pharmacy consult placed for chest pain/ACS and atrial fibrillation.   Heparin  level 0.38 is on lower end of therapeutic on 1200 units/hr. Hgb down but per MD, is close to patinet's baseline. Likely dehydrated on admit.. Patient was a difficult stick for phlebotomy. No issues with infusion or bleeding per RN.   Goal of Therapy:  Heparin  level 0.3-0.7 units/ml Monitor platelets by anticoagulation protocol: Yes   Plan:  Increase heparin  1250 units/hr Monitor daily heparin  level, CBC, signs/symptoms of bleeding    Jinnie Door, PharmD, BCPS, [facility] Clinical Pharmacist  Please check AMION for all [facility]acy phone numbers After 10:00 PM, call Main Pharmacy [phone]

## 2023-11-12 NOTE — ED Notes (Signed)
 Rn attempted x2 to obtain blood sample. RN unable to obtain blood for CBC and CMP

## 2023-11-12 NOTE — Assessment & Plan Note (Addendum)
 Severe calcific coronary artery disease with chronic angina Evaluated by Cardiology.  They suspected type 2 NSTEMI, not ACS.   - Stop heparin  - Continue Lipitor 

## 2023-11-12 NOTE — Assessment & Plan Note (Signed)
 -  Continue Lipitor

## 2023-11-12 NOTE — Significant Event (Signed)
 Rapid Response Event Note   Reason for Call :  Respiratory Distress  Initial Focused Assessment:  Patient is lying in bed.  She is alert.  She is belly breathing with RR 35-45  Lung sounds with wheezes, diminished bases.  BP  130/52  HR 120s  RR 35-45  O2 sat 98% on [place]  Dr Jonel came to bedside  Interventions:  Albuterol  neb PCXR ABG  Placed patient on Bipap per RT 80 mg Lasix  given IV  Plan of Care:  RN to call if patient does not tolerated Bipap or has additional respiratory distress.    Event Summary:   MD Notified:  Dr Jonel came to bedside Call Time: 1455 Arrival Time: 1459 End Time:  1545  Elvin [place], RN

## 2023-11-12 NOTE — Progress Notes (Signed)
 Progress Note   Patient: [name] FMW:[id number] DOB: May 08, 1956 DOA: 11/11/2023     1 DOS: the patient was seen and examined on 11/12/2023 at 8:02AM      Brief hospital course: 69 y.o. F with obesity, sCHF EF 25%, recent NSTEMI treated medically, DM and hx bilat BKA, HTN, CKD IIIa baseline 1.2 and hx stroke without residual who presented with SOB.  Recently admitted for NSTEMI/CHF, underwent LHC that showed severe calcific disease, 80% mid-LAD, 90% prox RCA and 90% ramus intermedius, no PCI due to technically challenging targets, not CABG candidate.  At 5/19 OV, was feeling well, but in the interim, got worse, time course unclear, and was sent back from SNF with SOB.   In the ER, CXR showed bilateral opacities. BP normal but lactate >5.  Started on antibiotics, diuretics, Cardiology consulted.       Assessment and Plan: * Severe sepsis due to community acquired pneumonia (HCC) Acute respiratory failure with hypoxia Presented with tachycardia, tachypnea, leukocytosis, and respiratory failure with lactic acid greater than 5.  Chest x-ray shows bilateral infiltrates.  Procalcitonin 0.6. VBG showing respiratory alkalosis, tachypnea to 30s, requiring 8L.  No prior history COPD or asthma - Continue vancomycin  and cefepime, day 2, for suspected pneumonia - Trend procalcitonin - Albuterol  - Trend LA    Acute on chronic combined systolic and diastolic CHF (congestive heart failure) (HCC) Patient feels her breathing is slightly better.  Ins and outs, weights not being recorded in the ER Creatinine slightly worse but within baseline range.  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 - Daily BMP while on Lasix   Chronic iron  deficiency anemia Hemoglobin stable within previous baseline range - IV iron  defer to cardiology  Type 2 diabetes mellitus with hyperlipidemia (HCC) Glucose controlled - Hold metformin , GLP-1, glargine for now - Continue sliding scale corrections  Essential hypertension Cerebrovascular disease Blood pressure normal - Continue Lasix  -Hold Isordil , hydralazine , metoprolol  until hemodynamics clear  HLD (hyperlipidemia) - Continue Lipitor   S/P BKA (below knee amputation) bilateral (HCC)    Class 2 obesity BMI 38, morbid obesity in the setting of comorbid diabetes, hypertension          Subjective: Patient says she is feeling slightly better, she is oriented.  However she appears quite ill.  Overnight she was started on heparin , she is wheezing.  Unclear if she is making much urine.     Physical Exam: BP 126/68   Pulse 84   Temp 97.7 F (36.5 C) (Oral)   Resp 20   Ht 5' 6 (1.676 m)   Wt 107 kg   SpO2 100%   BMI 38.07 kg/m   Obese adult female, listless, lying in bed, responds to questions, makes eye contact RRR, no murmurs, heart sounds distant due to body habitus, no pitting in the periphery, JVP not visible due to body habitus Respiratory rate elevated, wheezing  bilaterally, I do not appreciate rales but exam is limited Abdomen soft, no tenderness or guarding She responds to questions, is oriented to person, place, and time, she has severe generalized weakness, face is symmetric, speech is fluent     Data Reviewed: Basic metabolic panel shows stable potassium, creatinine  slightly up to 1.7 CBC shows improving leukocytosis, hemoglobin down to 9.6    Family Communication:     Disposition: Status is: Inpatient         Author: Lonni SHAUNNA Dalton, MD 11/12/2023 8:22 AM  For on call review [email or web address].

## 2023-11-12 NOTE — ED Notes (Signed)
 [facility] charge notified patient will be comin gup in about 15-20 mins

## 2023-11-12 NOTE — Progress Notes (Signed)
 PT Cancellation Note  Patient Details Name: [name] MRN: [id number] DOB: [birth year]   Cancelled Treatment:    Reason Eval/Treat Not Completed: Medical issues which prohibited therapy - elevating troponin, now SOB. PT to check back tomorrow to see if medically appropriate.   Tenley Winward S, PT DPT Acute Rehabilitation Services Secure Chat Preferred  Office [phone]    [name] [name] 11/12/2023, 10:48 AM

## 2023-11-12 NOTE — ED Notes (Signed)
 Pt provided breakfast tray, sitting upright and eating at this time.

## 2023-11-12 NOTE — Assessment & Plan Note (Addendum)
 Cerebrovascular disease Hypotensive - Hold Lasix , Isordil , hydralazine , metoprolol 

## 2023-11-12 NOTE — ED Notes (Signed)
 CCMD called to admit the patient for cardiac monitoring.

## 2023-11-12 NOTE — Assessment & Plan Note (Signed)
 BMI 38, morbid obesity in the setting of comorbid diabetes, hypertension

## 2023-11-13 DIAGNOSIS — A419 Sepsis, unspecified organism: Secondary | ICD-10-CM | POA: Diagnosis not present

## 2023-11-13 DIAGNOSIS — R6520 Severe sepsis without septic shock: Secondary | ICD-10-CM | POA: Diagnosis not present

## 2023-11-13 LAB — COMPREHENSIVE METABOLIC PANEL WITH GFR
ALT: 21 U/L (ref 0–44)
AST: 32 U/L (ref 15–41)
Albumin: 2.4 g/dL — ABNORMAL LOW (ref 3.5–5.0)
Alkaline Phosphatase: 100 U/L (ref 38–126)
Anion gap: 18 — ABNORMAL HIGH (ref 5–15)
BUN: 41 mg/dL — ABNORMAL HIGH (ref 8–23)
CO2: 16 mmol/L — ABNORMAL LOW (ref 22–32)
Calcium: 7.7 mg/dL — ABNORMAL LOW (ref 8.9–10.3)
Chloride: 103 mmol/L (ref 98–111)
Creatinine, Ser: 2.76 mg/dL — ABNORMAL HIGH (ref 0.44–1.00)
GFR, Estimated: 18 mL/min — ABNORMAL LOW (ref >60)
Glucose, Bld: 195 mg/dL — ABNORMAL HIGH (ref 70–99)
Potassium: 3.9 mmol/L (ref 3.5–5.1)
Sodium: 137 mmol/L (ref 135–145)
Total Bilirubin: 0.8 mg/dL (ref 0.0–1.2)
Total Protein: 6.1 g/dL — ABNORMAL LOW (ref 6.5–8.1)

## 2023-11-13 LAB — CBC
HCT: 31.1 % — ABNORMAL LOW (ref 36.0–46.0)
Hemoglobin: 9.2 g/dL — ABNORMAL LOW (ref 12.0–15.0)
MCH: 25.7 pg — ABNORMAL LOW (ref 26.0–34.0)
MCHC: 29.6 g/dL — ABNORMAL LOW (ref 30.0–36.0)
MCV: 86.9 fL (ref 80.0–100.0)
Platelets: 215 10*3/uL (ref 150–400)
RBC: 3.58 MIL/uL — ABNORMAL LOW (ref 3.87–5.11)
RDW: 19.4 % — ABNORMAL HIGH (ref 11.5–15.5)
WBC: 15.9 10*3/uL — ABNORMAL HIGH (ref 4.0–10.5)
nRBC: 0 % (ref 0.0–0.2)

## 2023-11-13 LAB — HEPARIN LEVEL (UNFRACTIONATED): Heparin Unfractionated: 0.37 [IU]/mL (ref 0.30–0.70)

## 2023-11-13 LAB — CREATININE, SERUM
Creatinine, Ser: 3.2 mg/dL — ABNORMAL HIGH (ref 0.44–1.00)
GFR, Estimated: 15 mL/min — ABNORMAL LOW (ref >60)

## 2023-11-13 LAB — GLUCOSE, CAPILLARY
Glucose-Capillary: 173 mg/dL — ABNORMAL HIGH (ref 70–99)
Glucose-Capillary: 185 mg/dL — ABNORMAL HIGH (ref 70–99)
Glucose-Capillary: 174 mg/dL — ABNORMAL HIGH (ref 70–99)
Glucose-Capillary: 219 mg/dL — ABNORMAL HIGH (ref 70–99)

## 2023-11-13 LAB — PROCALCITONIN: Procalcitonin: 2.49 ng/mL

## 2023-11-13 LAB — MRSA NEXT GEN BY PCR, NASAL: MRSA by PCR Next Gen: NOT DETECTED

## 2023-11-13 MED ORDER — SODIUM CHLORIDE 0.9 % IV SOLN
2.0000 g | INTRAVENOUS | Status: DC
  Administered 2023-11-14 – 2023-11-18 (×5): 2 g via INTRAVENOUS
  Filled 2023-11-13 (×5): qty 12.5

## 2023-11-13 MED ORDER — SODIUM CHLORIDE 0.9 % IV SOLN: INTRAVENOUS | Status: AC

## 2023-11-13 MED ORDER — HEPARIN SODIUM (PORCINE) 5000 UNIT/ML IJ SOLN
5000.0000 [IU] | Freq: Three times a day (TID) | INTRAMUSCULAR | Status: DC
  Administered 2023-11-13 – 2023-11-24 (×32): 5000 [IU] via SUBCUTANEOUS
  Filled 2023-11-13 (×33): qty 1

## 2023-11-13 MED ORDER — ENSURE MAX PROTEIN PO LIQD
11.0000 [oz_av] | Freq: Two times a day (BID) | ORAL | Status: DC
  Administered 2023-11-13 – 2023-11-16 (×5): 11 [oz_av] via ORAL
  Filled 2023-11-13 (×15): qty 330

## 2023-11-13 MED ORDER — ALBUTEROL SULFATE (2.5 MG/3ML) 0.083% IN NEBU
2.5000 mg | INHALATION_SOLUTION | Freq: Three times a day (TID) | RESPIRATORY_TRACT | Status: DC
  Administered 2023-11-13 – 2023-11-20 (×20): 2.5 mg via RESPIRATORY_TRACT
  Filled 2023-11-13 (×22): qty 3

## 2023-11-13 NOTE — Progress Notes (Signed)
 Heart Failure Navigator Progress Note  Assessed for Heart & Vascular TOC clinic readiness.  Patient does not meet criteria due to has a scheduled [facility] appointment on 12/02/2023. No HF TOC .   Navigator will sign off at this time.   Stephane Haddock, BSN, Scientist, clinical (histocompatibility and immunogenetics) Only

## 2023-11-13 NOTE — TOC Initial Note (Signed)
 Transition of Care [facility]) - Initial/Assessment Note    Patient Details  Name: [name] MRN: [id number] Date of Birth: [birth year]  [facility]) CM/SW Contact:    Luise JAYSON Pan, LCSWA Phone Number: 11/13/2023, 10:21 AM  Clinical Narrative:   Patient admitted from [facility] facility. Patient can return when MR per [facility] w/ admissions.                TOC will continue to follow.    Expected Discharge Plan: Long Term Nursing Home Barriers to Discharge: Continued Medical Work up   Patient Goals and CMS Choice Patient states their goals for this hospitalization and ongoing recovery are:: Return to LTC          Expected Discharge Plan and Services In-house Referral: Clinical Social Work     Living arrangements for the past 2 months: Skilled Nursing Facility                                      Prior Living Arrangements/Services Living arrangements for the past 2 months: Skilled Nursing Facility Lives with:: Facility Resident Patient language and need for interpreter reviewed:: No Do you feel safe going back to the place where you live?: Yes      Need for Family Participation in Patient Care: Yes (Comment) Care giver support system in place?: Yes (comment)   Criminal Activity/Legal Involvement Pertinent to Current Situation/Hospitalization: No - Comment as needed  Activities of Daily Living   ADL Screening (condition at time of admission) Independently performs ADLs?: Yes (appropriate for developmental age) Is the patient deaf or have difficulty hearing?: No Does the patient have difficulty seeing, even when wearing glasses/contacts?: No Does the patient have difficulty concentrating, remembering, or making decisions?: No  Permission Sought/Granted Permission sought to share information with : Facility Medical sales representative, Family Supports Permission granted to share information with : No (Came from facility, family contact in chart  and is [facility]CA)  Share Information with NAME: Luane  Permission granted to share info w AGENCY: SNF  Permission granted to share info w Relationship: Niece/HCA  Permission granted to share info w Contact Information: [phone]  Emotional Assessment Appearance:: Appears stated age Attitude/Demeanor/Rapport: Unable to Assess Affect (typically observed): Unable to Assess Orientation: : Oriented to Self, Oriented to Place, Oriented to  Time, Oriented to Situation Alcohol / Substance Use: Not Applicable Psych Involvement: No (comment)  Admission diagnosis:  SOB (shortness of breath) [R06.02] Community acquired pneumonia, unspecified laterality [J18.9] Congestive heart failure, unspecified HF chronicity, unspecified heart failure type (HCC) [I50.9] Patient Active Problem List   Diagnosis Date Noted   PAF (paroxysmal atrial fibrillation) (HCC) 11/12/2023   Severe sepsis due to community acquired pneumonia (HCC) 11/11/2023   Pressure injury of skin 09/14/2023   Ischemic cardiomyopathy 09/14/2023   Acute renal failure superimposed on stage 3a chronic kidney disease (HCC) 09/08/2023   Leukocytosis 09/07/2023   Chronic diastolic CHF (congestive heart failure) (HCC) 09/07/2023   Type 2 diabetes mellitus with hyperlipidemia (HCC) 09/07/2023   Chronic iron  deficiency anemia 09/07/2023   Acute on chronic combined systolic and diastolic CHF (congestive heart failure) (HCC) 09/07/2023   Non-STEMI (non-ST elevated myocardial infarction) (HCC) 09/06/2023   Heme positive stool    Rectal polyp    Adenomatous polyp of transverse colon    Gastritis and gastroduodenitis    Phantom limb pain (HCC) 11/09/2017   Other abnormalities of  gait and mobility 11/09/2017   S/P BKA (below knee amputation) bilateral (HCC) 08/20/2015   Dental caries 08/20/2015   Constipation 08/15/2015   Depression 08/15/2015   Pain in shoulder 06/28/2015   Hemiparesis (HCC) 01/10/2013   Tobacco abuse    Paresthesia of right  arm and leg 12/17/2011   Facial droop 12/15/2011   TIA (transient ischemic attack) 12/15/2011   Syncope 07/02/2011   Lower urinary tract infectious disease 07/02/2011   Normocytic anemia 07/02/2011   Hereditary and idiopathic peripheral neuropathy 04/02/2009   Essential hypertension 04/02/2009   Coronary artery disease involving native coronary artery of native heart without angina pectoris 04/02/2009   HIATAL HERNIA 04/02/2009   Irritable bowel syndrome 04/02/2009   HLD (hyperlipidemia) 05/30/2008   Class 2 obesity 05/30/2008   Cerebrovascular disease 05/30/2008   CVA (cerebral infarction) 05/30/2008   GERD 05/30/2008   PCP:  Ellaree Hansel [name], MD Pharmacy:   [id number] - [place] - [address]  ([place]) [place] [address] Phone: [phone] Fax: [phone]  [facility] [id number] [place], [place] - [address] [place] [address] Phone: [phone] Fax: [phone]     Social Drivers of Health (SDOH) Social History: SDOH Screenings   Food Insecurity: No Food Insecurity (11/12/2023)  Housing: Low Risk  (11/12/2023)  Transportation Needs: No Transportation Needs (11/12/2023)  Utilities: Not At Risk (11/12/2023)  Social Connections: Unknown (11/12/2023)  Tobacco Use: Medium Risk (11/11/2023)   SDOH Interventions:     Readmission Risk Interventions     No data to display

## 2023-11-13 NOTE — Progress Notes (Signed)
 Initial Nutrition Assessment  DOCUMENTATION CODES:   Obesity unspecified  INTERVENTION:  Liberalize diet to carb modified to provide increased options for pt and promote adequate intake while continuing to monitor carbohydrate intake  Ensure Max po BID, each supplement provides 150 kcal and 30 grams of protein.   Encouraged adequate intake of meals to help meet calorie and protein needs and aid in recovery  NUTRITION DIAGNOSIS:   Inadequate oral intake related to acute illness, poor appetite (SOB, hypotension, reliance on [place] O2) as evidenced by estimated needs, meal completion < 25%.  GOAL:   Patient will meet greater than or equal to 90% of their needs  MONITOR:   PO intake, Supplement acceptance  REASON FOR ASSESSMENT:   Consult Other (Comment) (nutritional goals)  ASSESSMENT:   Pt with hx of type 2 diabetes w/ neuropathy, chronic iron  deficiency anemia, chronic heart failure, IBS, HTN, and stroke. Hx of L BKA (2013) and R BKA (2015). Pt admitted with SOB and hypotensive, diagnosed with sepsis related to community acquired pneumonia.  Spoke with pt who was awake and alert. Pt expressed she was still having a hard time breathing so she was unable to give a lot of details for diet hx. Pt reports she ate some of her breakfast which consisted of grits, eggs, and milk, diet summary documentation shows only 5% eaten. Pt nodded that because she's having a hard time breathing, she does not have much of an appetite. Discussed adding protein shakes to help with intake, pt agreeable. Discussed liberalizing pt's diet to provide increased options, pt agreeable.   Pt reports eating 3x per day PTA. Reports she would rely on sandwiches for most meals since it was easy to eat. Reports including protein on sandwiches. Reports only vegetable she would eat is corn. Pt reports she would have meals delivered to her room at SNF. Pt reports no chewing or swallowing issues. Pt relies on wheelchair for  mobility due to bilateral BKA.   Discussed importance of nutrition and consistent carbohydrate intake for diabetes management, pt shook head in understanding.   Medications reviewed and include:  Protonix  Thiamine  100 mg SSI novolog  Cefepime   Labs reviewed:  CBG over last 24: 134-210 mg/dL J8R 7.3 BUN 58/ Creatinine 2.76  NUTRITION - FOCUSED PHYSICAL EXAM:  Flowsheet Row Most Recent Value  Orbital Region No depletion  Upper Arm Region Mild depletion  Thoracic and Lumbar Region No depletion  Buccal Region No depletion  Temple Region No depletion  Clavicle Bone Region No depletion  Clavicle and Acromion Bone Region No depletion  Scapular Bone Region No depletion  Dorsal Hand No depletion  Patellar Region No depletion  Anterior Thigh Region No depletion  Posterior Calf Region Unable to assess  [bilateral BKA]  Edema (RD Assessment) None  Hair Reviewed  Eyes Reviewed  Mouth Reviewed  Skin Reviewed  Nails Reviewed   Diet Order:   Diet Order             Diet Carb Modified Fluid consistency: Thin; Room service appropriate? Yes; Fluid restriction: 1500 mL Fluid  Diet effective now                   EDUCATION NEEDS:   Education needs have been addressed  Skin:  Skin Assessment: Reviewed RN Assessment  Last BM:  6/25  Height:   Ht Readings from Last 1 Encounters:  11/12/23 5' 6 (1.676 m)   Weight:   Wt Readings from Last 1 Encounters:  11/13/23 103.1  kg   Ideal Body Weight:  59.1 kg  BMI:  Body mass index is 36.69 kg/m.  Estimated Nutritional Needs:   Kcal:  1500-1700  Protein:  65-80g  Fluid:  1.5-1.7L  Josette Glance, MS, RDN, LDN Clinical Dietitian I Please reach out via secure chat

## 2023-11-13 NOTE — Progress Notes (Signed)
 PT Cancellation Note  Patient Details Name: [name] MRN: [id number] DOB: [birth year]   Cancelled Treatment:    Reason Eval/Treat Not Completed: PT screened, no needs identified, will sign off.  Pt is at her limited functional baseline.  PT will sign off.  Thanks.   Vinie GAILS [name] 11/13/2023, 12:55 PM

## 2023-11-13 NOTE — Plan of Care (Signed)
   Problem: Coping: Goal: Ability to adjust to condition or change in health will improve Outcome: Progressing   Problem: Fluid Volume: Goal: Ability to maintain a balanced intake and output will improve Outcome: Progressing   Problem: Health Behavior/Discharge Planning: Goal: Ability to identify and utilize available resources and services will improve Outcome: Progressing

## 2023-11-13 NOTE — Progress Notes (Signed)
 OT Cancellation Note  Patient Details Name: [name] MRN: [id number] DOB: 16-Jun-1955   Cancelled Treatment:    Reason Eval/Treat Not Completed: OT screened, no needs identified, will sign off. Pt is a LTC resident at [facility]. At baseline pt is dependent for all mobility and self-care. Pt reporting no difficulties with self-feeding or grooming since admission. No OT needs identified, OT is signing off on this pt.  [name], OT  Acute Rehabilitation Services Office [phone] Secure chat preferred   [name] 11/13/2023, 11:11 AM

## 2023-11-13 NOTE — Progress Notes (Signed)
  Progress Note   Patient: [name] FMW:[id number] DOB: [birth year] DOA: 11/11/2023     2 DOS: the patient was seen and examined on 11/13/2023 at 8:15AM      Brief hospital course: 68 y.o. F with obesity, sCHF EF 25%, recent NSTEMI treated medically, DM and hx bilat BKA, HTN, CKD IIIa baseline 1.2 and hx stroke without residual who presented with SOB.  Admitted for pneumonia and CHF.       Assessment and Plan: * Severe sepsis due to community acquired pneumonia (HCC) Acute respiratory failure with hypoxia MRSA nares negative. - Continue Cefepime  - Stop vanc - Trend procal      Acute renal failure superimposed on stage 3a chronic kidney disease (HCC) BP low overnight.  Cr today up to 2.7 > 3.2 - Stop diuresis - Gentle IVF  Chronic iron  deficiency anemia Hgb stable  Type 2 diabetes mellitus with hyperlipidemia (HCC) Glucose controlled - Hold metformin , GLP-1, glargine for now - Continue sliding scale corrections  Essential hypertension Cerebrovascular disease BP low.  Given midodrine  yetserday.  BP stable today.    - Hold Lasix   - Hold Isordil , hydralazine , metoprolol   HLD (hyperlipidemia) - Continue Lipitor   S/P BKA (below knee amputation) bilateral (HCC)    Acute on chronic combined systolic and diastolic CHF (congestive heart failure) (HCC) - Hold Lasix  - Daily BMP - Strict ins and outs, daily weights - Hold Isordil , hydralazine , metoprolol  until hemodynamics are clear   Demand ischemia due to type 2 NSTEMI (HCC) Severe calcific coronary artery disease with chronic angina Evaluated by Cardiology.  They suspected type 2 NSTEMI, not ACS.   - Stop heparin  - Continue Lipitor    Class 2 obesity BMI 38, morbid obesity in the setting of comorbid diabetes, hypertension          Subjective: The patient feels okay, she has no new shortness of breath, no palpitations, no chest pain.  No fever.  No respiratory distress.  No nursing  concerns     Physical Exam: BP (!) 100/49   Pulse 89   Temp 97.8 F (36.6 C) (Oral)   Resp (!) 31   Ht 5' 6 (1.676 m)   Wt 103.1 kg   SpO2 97%   BMI 36.69 kg/m   Adult female, lying in bed, interactive and appropriate, appears tired RRR, no murmurs, heart sounds distant due to body habitus, no pitting in her leg stumps, JVP not visible due to body habitus Respiratory rate seems normal, no accessory muscle use, no wheezing, no rales Abdomen soft, no tenderness palpation or guarding Responds to questions, oriented to person, place, and time, face symmetric, generalized weakness, speech fluent    Data Reviewed: Basic metabolic panel shows bicarb down to 16, BUN elevated, creatinine up to 2.7 CBC shows leukocytosis stable at 15.9, hemoglobin stable at 9.2     Family Communication:     Disposition: Status is: Inpatient         Author: Lonni SHAUNNA Dalton, MD 11/13/2023 1:45 PM  For on call review [email or web address].

## 2023-11-13 NOTE — Progress Notes (Signed)
 ANTICOAGULATION CONSULT NOTE  Pharmacy Consult for Heparin  Indication: chest pain/ACS and atrial fibrillation  Allergies  Allergen Reactions   Banana Swelling and Other (See Comments)    Facial swelling   Penicillins Anaphylaxis, Itching and Rash   Strawberry Extract Swelling    Facial swelling    Patient Measurements: Height: 5' 6 (167.6 cm) Weight: 103.1 kg (227 lb 4.7 oz) IBW/kg (Calculated) : 59.3 Heparin  Dosing Weight: 84 kg  Vital Signs: Temp: 98.9 F (37.2 C) (06/27 0411) Temp Source: Axillary (06/27 0411) BP: 105/60 (06/27 0411) Pulse Rate: 85 (06/27 0600)  Labs: Recent Labs    11/11/23 1318 11/11/23 1505 11/11/23 1655 11/11/23 2031 11/12/23 0438 11/12/23 0500 11/13/23 0302  HGB 12.3  --   --  10.9*  --  9.6* 9.2*  HCT 40.8  --   --  32.0*  --  33.2* 31.1*  PLT 327  --   --   --   --  220 215  HEPARINUNFRC  --   --   --   --  0.38  --  0.37  CREATININE 1.51*  --   --   --   --  1.73* 2.76*  TROPONINIHS  --  1,347* 2,159*  --   --   --   --     Estimated Creatinine Clearance: 23.3 mL/min (A) (by C-G formula based on SCr of 2.76 mg/dL (H)).   Medical History: Past Medical History:  Diagnosis Date   Atypical chest pain    a. non obstructive by cath in 2008 and 2010;  b. 02/2012 Myoview : non-ischemic, EF 57%   Cellulitis    a. left foot-> s/p L BKA   Chronic kidney disease    Constipation    CVA (cerebral infarction)    a.  Small right parietal noted incidentally 04/2007;  b. right sided embolic CVA 05/2012;  c. TEE 2/14:  LVH, EF 55-60%, mild LAE, no LAA clot, no PFO, no R->L shunt by echo contrast, oscillating density on AV likely Lambl's Excressence    Diabetes mellitus, type II (HCC)    Diabetic retinopathy (HCC)    NPDR w/edema OU   Diaphragmatic hernia without mention of obstruction or gangrene    Dry skin    Esophageal reflux    HTN (hypertension)    Hx of BKA (HCC)    Bilateral   Hyperlipidemia    Irritable bowel syndrome    Morbid  obesity (HCC)    Obesity, unspecified    Pain in shoulder 06/28/2015   Peripheral neuropathy    Stroke [facility])    2013-'no residual'   Syncope and collapse    a. near-syncopal episode in November 2008;  b. 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   Tobacco abuse    Vertigo       Assessment: 57 yof with a history of HF, T2DM, HTN, HLD, anemia. Patient is presenting with SOB. No anticoagulation prior to admission. Heparin  per pharmacy consult placed for chest pain/ACS and atrial fibrillation.   H/H low but stable, pltc ok, heparin  level remains therapeutic.  Goal of Therapy:  Heparin  level 0.3-0.7 units/ml Monitor platelets by anticoagulation protocol: Yes   Plan:  Continue heparin  1250 units/h Daily heparin  level and CBC  Ozell Jamaica, PharmD, BCPS, [facility] Clinical Pharmacist [phone] Please check AMION for all [facility] numbers 11/13/2023

## 2023-11-14 ENCOUNTER — Inpatient Hospital Stay (HOSPITAL_COMMUNITY)

## 2023-11-14 DIAGNOSIS — I480 Paroxysmal atrial fibrillation: Secondary | ICD-10-CM

## 2023-11-14 DIAGNOSIS — A419 Sepsis, unspecified organism: Secondary | ICD-10-CM | POA: Diagnosis not present

## 2023-11-14 DIAGNOSIS — I2510 Atherosclerotic heart disease of native coronary artery without angina pectoris: Secondary | ICD-10-CM | POA: Diagnosis not present

## 2023-11-14 DIAGNOSIS — I214 Non-ST elevation (NSTEMI) myocardial infarction: Secondary | ICD-10-CM | POA: Diagnosis not present

## 2023-11-14 DIAGNOSIS — I255 Ischemic cardiomyopathy: Secondary | ICD-10-CM | POA: Diagnosis not present

## 2023-11-14 LAB — URINALYSIS, MICROSCOPIC (REFLEX): WBC, UA: >50 WBC/hpf (ref 0–5)

## 2023-11-14 LAB — BASIC METABOLIC PANEL WITH GFR
Anion gap: 16 — ABNORMAL HIGH (ref 5–15)
BUN: 49 mg/dL — ABNORMAL HIGH (ref 8–23)
CO2: 17 mmol/L — ABNORMAL LOW (ref 22–32)
Calcium: 7.3 mg/dL — ABNORMAL LOW (ref 8.9–10.3)
Chloride: 102 mmol/L (ref 98–111)
Creatinine, Ser: 3.88 mg/dL — ABNORMAL HIGH (ref 0.44–1.00)
GFR, Estimated: 12 mL/min — ABNORMAL LOW (ref >60)
Glucose, Bld: 199 mg/dL — ABNORMAL HIGH (ref 70–99)
Potassium: 4.2 mmol/L (ref 3.5–5.1)
Sodium: 135 mmol/L (ref 135–145)

## 2023-11-14 LAB — CBC
HCT: 28.4 % — ABNORMAL LOW (ref 36.0–46.0)
Hemoglobin: 8.5 g/dL — ABNORMAL LOW (ref 12.0–15.0)
MCH: 25.8 pg — ABNORMAL LOW (ref 26.0–34.0)
MCHC: 29.9 g/dL — ABNORMAL LOW (ref 30.0–36.0)
MCV: 86.1 fL (ref 80.0–100.0)
Platelets: 217 10*3/uL (ref 150–400)
RBC: 3.3 MIL/uL — ABNORMAL LOW (ref 3.87–5.11)
RDW: 19.4 % — ABNORMAL HIGH (ref 11.5–15.5)
WBC: 14.3 10*3/uL — ABNORMAL HIGH (ref 4.0–10.5)
nRBC: 0 % (ref 0.0–0.2)

## 2023-11-14 LAB — URINALYSIS, ROUTINE W REFLEX MICROSCOPIC
Bilirubin Urine: NEGATIVE
Glucose, UA: 100 mg/dL — AB
Ketones, ur: 15 mg/dL — AB
Nitrite: NEGATIVE
Protein, ur: >=300 mg/dL
Specific Gravity, Urine: >1.03 — ABNORMAL HIGH (ref 1.005–1.030)
pH: 5 (ref 5.0–8.0)

## 2023-11-14 LAB — GLUCOSE, CAPILLARY
Glucose-Capillary: 184 mg/dL — ABNORMAL HIGH (ref 70–99)
Glucose-Capillary: 231 mg/dL — ABNORMAL HIGH (ref 70–99)
Glucose-Capillary: 237 mg/dL — ABNORMAL HIGH (ref 70–99)
Glucose-Capillary: 231 mg/dL — ABNORMAL HIGH (ref 70–99)

## 2023-11-14 LAB — CREATININE, URINE, RANDOM: Creatinine, Urine: 326 mg/dL

## 2023-11-14 LAB — SODIUM, URINE, RANDOM: Sodium, Ur: 79 mmol/L

## 2023-11-14 MED ORDER — ALBUMIN HUMAN 25 % IV SOLN
12.5000 g | Freq: Once | INTRAVENOUS | Status: AC
  Administered 2023-11-14: 12.5 g via INTRAVENOUS
  Filled 2023-11-14: qty 50

## 2023-11-14 MED ORDER — SODIUM CHLORIDE 0.9 % IV BOLUS
500.0000 mL | Freq: Once | INTRAVENOUS | Status: AC
  Administered 2023-11-14: 500 mL via INTRAVENOUS

## 2023-11-14 MED ORDER — MIDODRINE HCL 5 MG PO TABS
2.5000 mg | ORAL_TABLET | Freq: Three times a day (TID) | ORAL | Status: DC
  Administered 2023-11-14 – 2023-11-22 (×18): 2.5 mg via ORAL
  Filled 2023-11-14 (×20): qty 1

## 2023-11-14 MED ORDER — SODIUM CHLORIDE 0.9 % IV SOLN: INTRAVENOUS | Status: DC

## 2023-11-14 MED ORDER — HYDROMORPHONE HCL 1 MG/ML IJ SOLN
0.5000 mg | INTRAMUSCULAR | Status: DC | PRN
  Administered 2023-11-15 – 2023-11-24 (×7): 0.5 mg via INTRAVENOUS
  Filled 2023-11-14 (×7): qty 1

## 2023-11-14 NOTE — Progress Notes (Signed)
 Progress Note   Patient: [name] FMW:[id number] DOB: 1956/04/24 DOA: 11/11/2023     3 DOS: the patient was seen and examined on 11/14/2023 at 9:25AM      Brief hospital course: 68 y.o. F with obesity, sCHF EF 25%, recent NSTEMI treated medically, DM and hx bilat BKA, HTN, CKD IIIa baseline 1.2 and hx stroke without residual who presented with SOB.  Recently admitted for NSTEMI/CHF, underwent LHC that showed severe calcific disease, 80% mid-LAD, 90% prox RCA and 90% ramus intermedius, no PCI due to technically challenging targets, not CABG candidate.  At 5/19 OV, was feeling well, but in the interim, got worse, time course unclear, and was sent back from SNF with SOB.   In the ER, CXR showed bilateral opacities. BP normal but lactate >5.  Started on antibiotics, diuretics, Cardiology consulted.  ECG with new atrial fibrillation.       Assessment and Plan: * Severe sepsis due to community acquired pneumonia (HCC) Acute respiratory failure with hypoxia Presented with tachycardia, tachypnea, leukocytosis, and respiratory failure with lactic acid greater than 5.  Chest x-ray shows bilateral infiltrates.  Procalcitonin 0.6. VBG showing respiratory alkalosis, tachypnea to 30s, requiring 8L.  No prior history COPD or asthma MRSA nares negative.  Vanc stopped. - Continue cefepime , day 4 of 7 -Hypertonic saline - Bronchodilators - Aggressive pulmonary toilet - Start midodrine      Acute renal failure superimposed on stage 3a chronic kidney disease (HCC) Baseline creatinine appears to be around 1.2 until her recent admission for CHF and NSTEMI, at which time it peaked at greater than 2.5  This admission, Cr up to 3.8 and rising.  In setting of hypotension, vancomycin . - Check UA, lytes - Renal US  - Continue IVF - Midodrine  - Daily BMP    Chronic iron  deficiency anemia Hemoglobin trending down in the setting of renal failure phlebotomy. - Recheck iron  stores  Type 2  diabetes mellitus with hyperlipidemia (HCC) Glucose high end of target range - Hold metformin , GLP-1, glargine for now - Continue sliding scale corrections, renal function worsening will defer changing for now  Essential hypertension Cerebrovascular disease Hypotensive - Hold Lasix , Isordil , hydralazine , metoprolol    HLD (hyperlipidemia) - Continue Lipitor   S/P BKA (below knee amputation) bilateral (HCC)    PAF (paroxysmal atrial fibrillation) (HCC) Previous ILR ~10 yrs ago showed no Afib.  On admission this time, transient Afib, now resolved.  In setting of sepsis, will defer [facility] for now. - Monitor on tele  Acute on chronic combined systolic and diastolic CHF (congestive heart failure) (HCC) Admitted on diuretics.  Cardiology consulted, recommended against LHC/RHC, recommended ongoing diuretics.  Creatinine now trending up, diuretics stopped - Hold diuretics - Hold antihypertensives - IV fluids   Demand ischemia due to type 2 NSTEMI (HCC) Severe calcific coronary artery disease with chronic angina Evaluated by Cardiology.  They suspected type 2 NSTEMI, not ACS.   - Continue Lipitor , Plavix    Class 2 obesity BMI 38, morbid obesity in the setting of comorbid diabetes, hypertension          Subjective: Patient reports she is feeling okay, she has no complaints, she has no dyspnea.  She is bringing up some sputum.  She has no confusion or fever.  She is not making much urine output     Physical Exam: BP 111/72   Pulse 96   Temp 98 F (36.7 C) (Oral)   Resp (!) 26   Ht 5' 6 (1.676 m)   Wt  106.1 kg   SpO2 97%   BMI 37.75 kg/m   Elderly adult female, lying in bed, nasal cannula in place, watching television, appears very tired, laconic speech, but no significant psychomotor slowing or confusion apparent Tachycardic, regular, sinus on telemetry, no systolic murmurs that I can appreciate, no pitting in her leg stumps, JVP not visible due to body  habitus Respiratory rate somewhat elevated, but no rales or wheezing.  No accessory muscle use.  Diminished overall. Abdomen soft, no tenderness to palpation or Bilateral BKA Attention normal, oriented to person, place, and time, face symmetric, speech fluent, overall very tired, severe generalized weakness      Data Reviewed: Basic metabolic panel shows worsening renal failure, mild acidosis CBC shows worsening anemia, unchanged leukocytosis    Family Communication: Niece yesterday afternoon    Disposition: Status is: Inpatient         Author: Lonni SHAUNNA Dalton, MD 11/14/2023 10:13 AM  For on call review [email or web address].

## 2023-11-14 NOTE — Progress Notes (Signed)
 Patient started using abdominal muscles again with breathing VSS. Patient has only had a urine output of 5mL for this shift and upon bladder scanning this patient it was showing 0mL. Renal ultrasound ordered for this patient and MD made aware. Results pending.

## 2023-11-14 NOTE — Progress Notes (Signed)
 Pharmacist Heart Failure Core Measure Documentation  Assessment: [name] has an EF documented as 20-25% on 09/07/23 by Echo.  Rationale: Heart failure patients with left ventricular systolic dysfunction (LVSD) and an EF < 40% should be prescribed an angiotensin converting enzyme inhibitor (ACEI) or angiotensin receptor blocker (ARB) at discharge unless a contraindication is documented in the medical record.  This patient is not currently on an ACEI or ARB for HF.  This note is being placed in the record in order to provide documentation that a contraindication to the use of these agents is present for this encounter.  ACE Inhibitor or Angiotensin Receptor Blocker is contraindicated (specify all that apply)  []   ACEI allergy AND ARB allergy []   Angioedema []   Moderate or severe aortic stenosis []   Hyperkalemia []   Hypotension []   Renal artery stenosis [x]   Worsening renal function, preexisting renal disease or dysfunction  Prentice Poisson, PharmD Clinical Pharmacist **Pharmacist phone directory can now be found on [email or web address] ([email or web address]).  Listed under [facility].

## 2023-11-14 NOTE — Plan of Care (Signed)

## 2023-11-14 NOTE — Assessment & Plan Note (Signed)
 Previous ILR ~10 yrs ago showed no Afib.  On admission this time, transient Afib, now resolved.  In setting of very brief transient episode during sepsis, will defer [facility] for now. - Monitor on tele

## 2023-11-14 NOTE — Plan of Care (Signed)
   Problem: Education: Goal: Ability to describe self-care measures that may prevent or decrease complications (Diabetes Survival Skills Education) will improve Outcome: Progressing   Problem: Coping: Goal: Ability to adjust to condition or change in health will improve Outcome: Progressing   Problem: Fluid Volume: Goal: Ability to maintain a balanced intake and output will improve Outcome: Progressing

## 2023-11-15 DIAGNOSIS — R6520 Severe sepsis without septic shock: Secondary | ICD-10-CM | POA: Diagnosis not present

## 2023-11-15 DIAGNOSIS — A419 Sepsis, unspecified organism: Secondary | ICD-10-CM | POA: Diagnosis not present

## 2023-11-15 LAB — CBC
HCT: 31.9 % — ABNORMAL LOW (ref 36.0–46.0)
Hemoglobin: 9.8 g/dL — ABNORMAL LOW (ref 12.0–15.0)
MCH: 26 pg (ref 26.0–34.0)
MCHC: 30.7 g/dL (ref 30.0–36.0)
MCV: 84.6 fL (ref 80.0–100.0)
Platelets: 218 10*3/uL (ref 150–400)
RBC: 3.77 MIL/uL — ABNORMAL LOW (ref 3.87–5.11)
RDW: 19.6 % — ABNORMAL HIGH (ref 11.5–15.5)
WBC: 13.3 10*3/uL — ABNORMAL HIGH (ref 4.0–10.5)
nRBC: 0.7 % — ABNORMAL HIGH (ref 0.0–0.2)

## 2023-11-15 LAB — IRON AND TIBC
Iron: 54 ug/dL (ref 28–170)
Saturation Ratios: 28 % (ref 10.4–31.8)
TIBC: 192 ug/dL — ABNORMAL LOW (ref 250–450)
UIBC: 138 ug/dL

## 2023-11-15 LAB — BASIC METABOLIC PANEL WITH GFR
Anion gap: 18 — ABNORMAL HIGH (ref 5–15)
BUN: 62 mg/dL — ABNORMAL HIGH (ref 8–23)
CO2: 13 mmol/L — ABNORMAL LOW (ref 22–32)
Calcium: 7.3 mg/dL — ABNORMAL LOW (ref 8.9–10.3)
Chloride: 104 mmol/L (ref 98–111)
Creatinine, Ser: 4.38 mg/dL — ABNORMAL HIGH (ref 0.44–1.00)
GFR, Estimated: 10 mL/min — ABNORMAL LOW (ref >60)
Glucose, Bld: 225 mg/dL — ABNORMAL HIGH (ref 70–99)
Potassium: 4.3 mmol/L (ref 3.5–5.1)
Sodium: 135 mmol/L (ref 135–145)

## 2023-11-15 LAB — GLUCOSE, CAPILLARY
Glucose-Capillary: 205 mg/dL — ABNORMAL HIGH (ref 70–99)
Glucose-Capillary: 205 mg/dL — ABNORMAL HIGH (ref 70–99)
Glucose-Capillary: 216 mg/dL — ABNORMAL HIGH (ref 70–99)
Glucose-Capillary: 230 mg/dL — ABNORMAL HIGH (ref 70–99)

## 2023-11-15 LAB — FERRITIN: Ferritin: 1093 ng/mL — ABNORMAL HIGH (ref 11–307)

## 2023-11-15 MED ORDER — INSULIN GLARGINE-YFGN 100 UNIT/ML ~~LOC~~ SOLN
6.0000 [IU] | Freq: Every day | SUBCUTANEOUS | Status: DC
  Administered 2023-11-15 – 2023-11-17 (×3): 6 [IU] via SUBCUTANEOUS
  Filled 2023-11-15 (×3): qty 0.06

## 2023-11-15 NOTE — Progress Notes (Signed)
 Progress Note   Patient: [name] FMW:[id number] DOB: October 25, 1955 DOA: 11/11/2023     4 DOS: the patient was seen and examined on 11/15/2023 at 9:09AM      Brief hospital course: 68 y.o. F with obesity, sCHF EF 20-25%, recent NSTEMI treated medically, DM and hx bilat BKA, HTN, CKD IIIa baseline 1.2 and hx stroke without residual who presented with SOB.  Recently admitted for NSTEMI/CHF, underwent LHC that showed severe calcific disease, 80% mid-LAD, 90% prox RCA and 90% ramus intermedius, no PCI due to technically challenging targets, not CABG candidate.  At 5/19 OV, was feeling well, but in the interim, got worse, time course unclear, and was sent back from SNF with SOB.   In the ER, CXR showed bilateral opacities. BP normal but lactate >5.  Started on antibiotics, diuretics, Cardiology consulted.  ECG with new atrial fibrillation.       Assessment and Plan: * Severe sepsis due to community acquired pneumonia (HCC) Acute respiratory failure with hypoxia Presented with tachycardia, tachypnea, leukocytosis, and respiratory failure with lactic acid greater than 5.  Chest x-ray shows bilateral infiltrates.  Procalcitonin 0.6. VBG showing respiratory alkalosis, tachypnea to 30s, requiring 8L.  No prior history COPD or asthma MRSA nares negative.  Vanc stopped. - Continue cefepime , day 5 of 7 - Bronchodilators - Aggressive pulmonary toilet - Continue midodrine      Acute renal failure superimposed on stage 3a chronic kidney disease (HCC) Baseline creatinine appears to be around 1.2 until her recent admission for CHF and NSTEMI, at which time it peaked at greater than 2.5  This admission, has developed oliguric renal failure, likely ischemic ATN in the setting of sepsis, vancomycin , hypotension on 6/27 and diuresis for CHF.   FeNA >2%, UA with leukocytes.  Renal US  unremarkable.  UOP low.  Gave fluids and albumin  yesterday but developed increased WOB so this was stopped -  Consult nephrology - Monitor UOP - Continue midodrine      Acute on chronic combined systolic and diastolic CHF (congestive heart failure) (HCC) Admitted on diuretics.  Cardiology consulted but have since signed off.  EF 20-25% with advanced CAD not amenable to intervention.  Creatinine now trending up, diuretics stopped - Hold diuretics - Hold antihypertensives    PAF (paroxysmal atrial fibrillation) (HCC) Previous ILR ~10 yrs ago showed no Afib.  On admission this time, transient Afib, now resolved.  In setting of sepsis, will defer [facility] for now. - Monitor on tele    Chronic iron  deficiency anemia Hemoglobin trending down in the setting of renal failure and phlebotomy. No clinical bleeding observed or reported. - Follow Hgb   Type 2 diabetes mellitus with hyperlipidemia (HCC) Hyperglycemia noted  - Hold metformin , GLP-1 - Resume low dose glargine - Continue sliding scale corrections, renal function worsening will defer changing for now  Essential hypertension Cerebrovascular disease BP soft - Continue midodrine  - Hold Lasix , Isordil , hydralazine , metoprolol    HLD (hyperlipidemia) - Continue Lipitor   S/P BKA (below knee amputation) bilateral (HCC)    Demand ischemia due to type 2 NSTEMI (HCC) Severe calcific coronary artery disease with chronic angina Evaluated by Cardiology.  They suspected type 2 NSTEMI, not ACS.   - Continue Lipitor , Plavix    Class 2 obesity BMI 38, morbid obesity in the setting of comorbid diabetes, hypertension          Subjective: Patient feels okay.  She has had no fever.  She is not bringing up a little bit of sputum, she has  had no chest pain, fever.  No confusion.  Minimal urine output.  Nephrology consulted today.     Physical Exam: BP 99/83   Pulse 100   Temp 97.7 F (36.5 C) (Oral)   Resp 18   Ht 5' 6 (1.676 m)   Wt 107.4 kg   SpO2 100%   BMI 38.22 kg/m   Elderly female, lying in bed, tired, but alert and  watching TV Tachycardic, regular, heart sounds diminished, JVP not visible due to body habitus, no pitting edema in legs stumps Respiratory rate elevated again, no rales or wheezes, she is debilitated but looks to be doing some belly breathing Abdomen soft, no tenderness palpation or guarding Bilateral BKA Attention normal, oriented x3, severe generalized weakness    Data Reviewed: Discussed with nephrology Basic metabolic panel shows creatinine up to 4.3, BUN up to 62 Acute metabolic acidosis due to renal failure CBC pending      Family Communication: Niece/POA by phone    Disposition: Status is: Inpatient         Author: Lonni SHAUNNA Dalton, MD 11/15/2023 12:30 PM  For on call review [email or web address].

## 2023-11-15 NOTE — Plan of Care (Signed)
  Problem: Education: Goal: Knowledge of General Education information will improve Description: Including pain rating scale, medication(s)/side effects and non-pharmacologic comfort measures Outcome: Progressing   Problem: Clinical Measurements: Goal: Respiratory complications will improve Outcome: Progressing   Problem: Clinical Measurements: Goal: Will remain free from infection Outcome: Progressing

## 2023-11-15 NOTE — Consult Note (Addendum)
 Renal Service Consult Note [facility]  [name] 11/15/2023 [name] Fret, MD Requesting Physician: Dr. Jonel  Reason for Consult: Renal failure HPI: The patient is a 69 y.o. year-old w/ PMH as below who presented to ED sent from [facility] due to shortness of breath on 11/11/2023.  O2 sats were in the 70s, heart rate 130s.  Patient was initially hypotensive.  Recently was admitted in April for NSTEMI.  The patient was admitted for severe sepsis due to community-acquired pneumonia with acute respiratory failure with hypoxia.  Chest x-ray showed bilateral infiltrates at bases.  IV vancomycin  and cefepime  were started. She was also felt to have acute on chronic syst/ diast CHF. Creat bumped up to 2.7. Then on 4th hospital day IV vanc was stopped, IV cefepime  continued. Aggressive pulm toilet was done. Midodrine  was started. Creat rose up to 3.8 in assoc w/ hypotension, prior IV vanc. Renal US  was negative, IVFs were cont'd and midodrine . Hospital day #6 today and creat up to 4.3, UOP 210 cc yesterday. We are asked to see for renal failure.   Pt seen . Patient with history of systolic heart failure EF 25%, obesity, diabetes, bilateral AKA, CKD 3A CAD.  Not a CABG candidate. Pt is oriented to place and year, and can carry on a short conversation but then fades off. No hx obtained.     ROS - n/a   Past Medical History  Past Medical History:  Diagnosis Date   Atypical chest pain    a. non obstructive by cath in 2008 and 2010;  b. 02/2012 Myoview : non-ischemic, EF 57%   Cellulitis    a. left foot-> s/p L BKA   Chronic kidney disease    Constipation    CVA (cerebral infarction)    a.  Small right parietal noted incidentally 04/2007;  b. right sided embolic CVA 05/2012;  c. TEE 2/14:  LVH, EF 55-60%, mild LAE, no LAA clot, no PFO, no R->L shunt by echo contrast, oscillating density on AV likely Lambl's Excressence    Diabetes mellitus, type II (HCC)    Diabetic  retinopathy (HCC)    NPDR w/edema OU   Diaphragmatic hernia without mention of obstruction or gangrene    Dry skin    Esophageal reflux    HTN (hypertension)    Hx of BKA (HCC)    Bilateral   Hyperlipidemia    Irritable bowel syndrome    Morbid obesity (HCC)    Obesity, unspecified    Pain in shoulder 06/28/2015   Peripheral neuropathy    Stroke [facility])    2013-'no residual'   Syncope and collapse    a. near-syncopal episode in November 2008;  b. 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   Tobacco abuse    Vertigo    Past Surgical History  Past Surgical History:  Procedure Laterality Date   AMPUTATION  12/30/2011   Procedure: AMPUTATION RAY;  Surgeon: Norleen Armor, MD;  Location: [facility] OR;  Service: Orthopedics;  Laterality: Left;  LEFT FIRST RAY AMPUTATION   AMPUTATION  01/13/2012   Procedure: AMPUTATION BELOW KNEE;  Surgeon: Norleen Armor, MD;  Location: [facility] OR;  Service: Orthopedics;  Laterality: Left;   AMPUTATION  03/04/2012   Procedure: AMPUTATION BELOW KNEE;  Surgeon: Norleen Armor, MD;  Location: [facility] OR;  Service: Orthopedics;  Laterality: Left;  Revision of Left Below Knee Amputation   AMPUTATION Right 11/02/2013   Procedure: AMPUTATION BELOW KNEE;  Surgeon: Jerona Harden GAILS, MD;  Location:  [facility] OR;  Service: Orthopedics;  Laterality: Right;  Right Below Knee Amputation   BIOPSY  03/29/2019   Procedure: BIOPSY;  Surgeon: San Sandor GAILS, DO;  Location: [facility] ENDOSCOPY;  Service: Gastroenterology;;  EGD and Colon   CARDIAC CATHETERIZATION     LAD 30%, circumflex 50%, OM 75%, RI 60% with small branch 80%, dominant RCA 60%, EF 45-50%   CHOLECYSTECTOMY     COLONOSCOPY     COLONOSCOPY WITH PROPOFOL  N/A 03/29/2019   Procedure: COLONOSCOPY WITH PROPOFOL ;  Surgeon: San Sandor GAILS, DO;  Location: [facility] ENDOSCOPY;  Service: Gastroenterology;  Laterality: N/A;   ESOPHAGOGASTRODUODENOSCOPY (EGD) WITH PROPOFOL  N/A 03/29/2019   Procedure: ESOPHAGOGASTRODUODENOSCOPY (EGD) WITH PROPOFOL ;  Surgeon:  San Sandor GAILS, DO;  Location: [facility] ENDOSCOPY;  Service: Gastroenterology;  Laterality: N/A;   EYE SURGERY Right    cataract   I & D EXTREMITY  12/23/2011   Procedure: IRRIGATION AND DEBRIDEMENT EXTREMITY;  Surgeon: Elspeth JONELLE Her, MD;  Location: [facility] OR;  Service: Orthopedics;  Laterality: Left;  Left Foot   I & D EXTREMITY  12/26/2011   Procedure: IRRIGATION AND DEBRIDEMENT EXTREMITY;  Surgeon: Norleen Armor, MD;  Location: [facility] OR;  Service: Orthopedics;  Laterality: Left;  LEFT FOOT I&D WITH POSSIBLE WOUND VAC APPLICATION, POSSIBLE LEFT FIRST RAY AMPUTATION   I & D EXTREMITY Right 10/29/2013   Procedure: IRRIGATION AND DEBRIDEMENT EXTREMITY;  Surgeon: Reyes JAYSON Billing, MD;  Location: [facility] OR;  Service: Orthopedics;  Laterality: Right;   Invasive Electrophysiologic Study  5/09   followed by insertion of an implantable loop recorder. S/p removal    Multiple Toe Surgeries     POLYPECTOMY  03/29/2019   Procedure: POLYPECTOMY;  Surgeon: San Sandor GAILS, DO;  Location: [facility] ENDOSCOPY;  Service: Gastroenterology;;   RIGHT/LEFT HEART CATH AND CORONARY ANGIOGRAPHY N/A 09/07/2023   Procedure: RIGHT/LEFT HEART CATH AND CORONARY ANGIOGRAPHY;  Surgeon: Anner Alm ORN, MD;  Location: [facility] INVASIVE CV LAB;  Service: Cardiovascular;  Laterality: N/A;   TEE WITHOUT CARDIOVERSION N/A 07/07/2012   Procedure: TRANSESOPHAGEAL ECHOCARDIOGRAM (TEE);  Surgeon: Redell GORMAN Shallow, MD;  Location: [facility] ENDOSCOPY;  Service: Cardiovascular;  Laterality: N/A;   Family History  Family History  Problem Relation Age of Onset   Pneumonia Mother    Gallbladder disease Mother        cancer   Heart failure Mother    Diabetes Father    Coronary artery disease Father    Stroke Neg Hx    Social History  reports that she has quit smoking. Her smoking use included cigarettes. She has a 10 pack-year smoking history. She has never used smokeless tobacco. She reports current alcohol use of about 4.0 standard drinks of alcohol per week. She  reports that she does not use drugs. Allergies  Allergies  Allergen Reactions   Banana Swelling and Other (See Comments)    Facial swelling   Penicillins Anaphylaxis, Itching and Rash   Strawberry Extract Swelling    Facial swelling   Home medications Prior to Admission medications   Medication Sig Start Date End Date Taking? Authorizing Provider  albuterol  (VENTOLIN  HFA) 108 (90 Base) MCG/ACT inhaler Inhale 2 puffs into the lungs in the morning and at bedtime.   Yes [provider]  aspirin  EC 81 MG tablet Take 81 mg by mouth daily.   Yes [provider]  atorvastatin  (LIPITOR ) 80 MG tablet Take 1 tablet (80 mg total) by mouth daily. 09/17/23  Yes Arrien, Elidia Sieving, MD  chlorhexidine  (PERIDEX )  0.12 % solution Use as directed 5 mLs in the mouth or throat 2 (two) times daily.   Yes [provider]  Cholecalciferol 1.25 MG (50000 UT) TABS Take 50,000 Units by mouth once a week. Take one tablet by mouth every Monday.   Yes [provider]  clopidogrel  (PLAVIX ) 75 MG tablet Take 1 tablet (75 mg total) by mouth daily. 09/17/23  Yes Arrien, Mauricio Daniel, MD  Dulaglutide 3 MG/0.5ML SOAJ Inject 4.5 mg into the skin once a week. Every Wednesday   Yes [provider]  ferrous sulfate  325 (65 FE) MG tablet Take 325 mg by mouth 2 (two) times daily.   Yes [provider]  furosemide  (LASIX ) 80 MG tablet Take 1 tablet (80 mg total) by mouth daily. 09/17/23  Yes Arrien, Elidia Sieving, MD  hydrALAZINE  (APRESOLINE ) 25 MG tablet Take 1 tablet (25 mg total) by mouth every 8 (eight) hours. 09/16/23  Yes Arrien, Mauricio Daniel, MD  Insulin  Glargine (BASAGLAR  KWIKPEN) 100 UNIT/ML Inject 15 Units into the skin 2 (two) times daily.   Yes [provider]  insulin  lispro (HUMALOG) 100 UNIT/ML injection Inject 4-14 Units into the skin See admin instructions. Inject 14 units into the skin two times daily in addition to sliding scale 150-200=4  units,201-250 = 8 units, 251-300 = 8 units, 301-350= 10 units, 351-400= 10 units, 401-450=14 units, 451-500 =12 units >500 = 14 units   Yes [provider]  ipratropium-albuterol  (DUONEB) 0.5-2.5 (3) MG/3ML SOLN Inhale 3 mLs into the lungs every 8 (eight) hours as needed (SOB/wheezing). 09/20/23  Yes [provider]  isosorbide  dinitrate (ISORDIL ) 20 MG tablet Take 1 tablet (20 mg total) by mouth 2 (two) times daily. 09/16/23  Yes Arrien, Mauricio Daniel, MD  loratadine (CLARITIN) 10 MG tablet Take 10 mg by mouth daily.   Yes [provider]  magnesium  hydroxide (MILK OF MAGNESIA) 400 MG/5ML suspension Take 30 mLs by mouth daily.   Yes [provider]  metFORMIN  (GLUCOPHAGE ) 500 MG tablet Take 500 mg by mouth daily. 07/13/23  Yes [provider]  Metoprolol  Tartrate 37.5 MG TABS Take 1 tablet by mouth daily. Hold for SBP<110 or pulse <80 10/10/23  Yes [provider]  nystatin (MYCOSTATIN/NYSTOP) powder Apply 1 Application topically 3 (three) times daily. Apply to skin folds/ R side neck topically threes times a day for candidiasis. Apply to reddened areas on inner elbows and abdominal fold/R side of neck   Yes [provider]  Olopatadine HCl 0.7 % SOLN Place 1 drop into both eyes daily.   Yes [provider]  polyethylene glycol (MIRALAX  / GLYCOLAX ) packet Take 17 g by mouth daily as needed for moderate constipation. 11/04/13  Yes Rizwan, Saima, MD  senna-docusate (SENOKOT-S) 8.6-50 MG tablet Take 1 tablet by mouth daily.   Yes [provider]  tetrahydrozoline 0.05 % ophthalmic solution Place 1 drop into both eyes at bedtime.   Yes [provider]  Tetrahydrozoline HCl (VISINE OP) Place 1 drop into both eyes at bedtime.   Yes [provider]  vitamin C  (ASCORBIC ACID) 500 MG tablet Take 500 mg by mouth daily.   Yes [provider]  Zinc  Oxide 40 % PSTE Apply 1 Application topically 2 (two) times  daily. Apply to sacrum topically every day and night shift; may also apply with each incontinent episode/   Yes [provider]  OXYGEN Inhale 2 L into the lungs as needed (Dyspnea).    [provider]     Vitals:   11/15/23 9163 11/15/23 0853 11/15/23 1145 11/15/23 1337  BP: 118/88  99/83   Pulse: 100     Resp: 18  18   Temp:   97.7 F (36.5 C)   TempSrc:   Oral   SpO2: 99% 98% 100% 98%  Weight:      Height:       Exam Gen alert, no distress, old adult, chronically ill appearing No rash, cyanosis or gangrene Sclera anicteric, throat clear  No jvd or bruits Chest clear bilat to bases, no rales/ wheezing RRR no MRG Abd soft ntnd no mass or ascites +bs GU no foley Ext bilat AKA, 1+ bilateral hip edema, no UE edema Neuro is awake, Ox 3, but still confused when tries to converse   Date   Creat  eGFR (ml/min) 2013- 2016  0.60- 1.10 2017- 2020  1.0 - 1.27 2023   1.11- 1.02 September 2023  1.23- 2.70 19- 48 ml/min    6/25   1.51  37  ml/min   11/12/23  1.73  32 11/13/23  2.76, 3.20 11/14/23  3.88 11/15/23  4.38   Renal US : 11.8/ 9.6 cm kidneys w/o hydro UA 11/14/23: smal Hb/ LE, prot > 300, many bact, 0-5 rbc, wbc >50 UNa 79, Ucr 326 IV meds of interest: CTA contrast 75 cc on 6/25 Has not been on pressors Has had periods of hypotension w/ soft BP's in 70s- 90s on 6/26 especially. BP's better the last 48 hrs.  IV vanc 2 gm x 1 on 6/25, none further IV cefepime  6/25 - current Midodrine  2.5 tid IV lasix  40- 80mg  total 4 doses, 6/25- 6/26 Total I/O's = 3.0 L in + 690 cc UOP out = net + 2.3 L   CXR 6/25 - bibasilar congestion/ edema  CXR 6/26 - improved basilar aeration, decreased vasc congestion   Assessment/ Plan: AKI on CKD 3b: b/l creat is 1.2- 1.7 from April - June 2025, eGFR 32- 48 ml/min. Creat here was 1.5 on admit 6/25. SP IV contrast on 6/25 w/ CTA chest. Pt was hypotensive on 6/26 and still had soft BP's on 6/27, but now is better.  UOP has been  marginal (200-300 cc/d). UA shows wbc's. Urine lytes c/w ATN. Renal US  shows no obstruction. AKI likely ATN d/t contrast + hypotension. Making low amts of urine. Maintain supportive care. Will follow.   Volume: no sig vol excess on exam, f/u CXR 6/26 looks close to normal after IV lasix . BP's are not low anymore, more low-normal  Debility: not sure what her capacity is DM2: per pmd HTN: bp's soft, getting midodrine  at 2.5 tid. Continue.       Myer Fret  MD [facility] 11/15/2023, 3:53 PM  Recent Labs  Lab 11/12/23 0500 11/13/23 0302 11/13/23 1148 11/14/23 0312 11/15/23 0432 11/15/23 1527  HGB 9.6* 9.2*  --  8.5*  --  9.8*  ALBUMIN  2.6* 2.4*  --   --   --   --   CALCIUM  7.7* 7.7*  --  7.3* 7.3*  --   CREATININE 1.73* 2.76*   < > 3.88* 4.38*  --   K 3.8 3.9  --  4.2 4.3  --    < > = values in this interval not displayed.   Inpatient medications:  albuterol   2.5 mg Nebulization TID   atorvastatin   80 mg Oral Daily   clopidogrel   75 mg Oral Daily   heparin   5,000 Units Subcutaneous  Q8H   insulin  aspart  0-9 Units Subcutaneous TID WC   insulin  glargine-yfgn  6 Units Subcutaneous Daily   midodrine   2.5 mg Oral TID WC   pantoprazole   40 mg Oral Daily   Ensure Max Protein  11 oz Oral BID   sodium chloride  flush  3 mL Intravenous Q12H   thiamine   100 mg Oral Daily    ceFEPime  (MAXIPIME ) IV 2 g (11/15/23 0354)   acetaminophen  **OR** acetaminophen , HYDROmorphone  (DILAUDID ) injection, ipratropium-albuterol , ondansetron  **OR** ondansetron  (ZOFRAN ) IV

## 2023-11-16 ENCOUNTER — Inpatient Hospital Stay (HOSPITAL_COMMUNITY)

## 2023-11-16 DIAGNOSIS — A419 Sepsis, unspecified organism: Secondary | ICD-10-CM | POA: Diagnosis not present

## 2023-11-16 DIAGNOSIS — R6520 Severe sepsis without septic shock: Secondary | ICD-10-CM | POA: Diagnosis not present

## 2023-11-16 HISTORY — PX: IR FLUORO GUIDE CV LINE RIGHT: IMG2283

## 2023-11-16 HISTORY — PX: IR US GUIDE VASC ACCESS RIGHT: IMG2390

## 2023-11-16 LAB — CULTURE, BLOOD (ROUTINE X 2)
Culture: NO GROWTH
Culture: NO GROWTH
Special Requests: ADEQUATE
Special Requests: ADEQUATE

## 2023-11-16 LAB — CBC
HCT: 32.9 % — ABNORMAL LOW (ref 36.0–46.0)
Hemoglobin: 9.7 g/dL — ABNORMAL LOW (ref 12.0–15.0)
MCH: 25.5 pg — ABNORMAL LOW (ref 26.0–34.0)
MCHC: 29.5 g/dL — ABNORMAL LOW (ref 30.0–36.0)
MCV: 86.6 fL (ref 80.0–100.0)
Platelets: 220 10*3/uL (ref 150–400)
RBC: 3.8 MIL/uL — ABNORMAL LOW (ref 3.87–5.11)
RDW: 19.6 % — ABNORMAL HIGH (ref 11.5–15.5)
WBC: 16.4 10*3/uL — ABNORMAL HIGH (ref 4.0–10.5)
nRBC: 1 % — ABNORMAL HIGH (ref 0.0–0.2)

## 2023-11-16 LAB — GLUCOSE, CAPILLARY
Glucose-Capillary: 198 mg/dL — ABNORMAL HIGH (ref 70–99)
Glucose-Capillary: 251 mg/dL — ABNORMAL HIGH (ref 70–99)
Glucose-Capillary: 302 mg/dL — ABNORMAL HIGH (ref 70–99)
Glucose-Capillary: 297 mg/dL — ABNORMAL HIGH (ref 70–99)

## 2023-11-16 LAB — RENAL FUNCTION PANEL
Albumin: 2.5 g/dL — ABNORMAL LOW (ref 3.5–5.0)
Anion gap: 18 — ABNORMAL HIGH (ref 5–15)
BUN: 70 mg/dL — ABNORMAL HIGH (ref 8–23)
CO2: 13 mmol/L — ABNORMAL LOW (ref 22–32)
Calcium: 7.6 mg/dL — ABNORMAL LOW (ref 8.9–10.3)
Chloride: 105 mmol/L (ref 98–111)
Creatinine, Ser: 4.94 mg/dL — ABNORMAL HIGH (ref 0.44–1.00)
GFR, Estimated: 9 mL/min — ABNORMAL LOW (ref >60)
Glucose, Bld: 205 mg/dL — ABNORMAL HIGH (ref 70–99)
Phosphorus: 5.8 mg/dL — ABNORMAL HIGH (ref 2.5–4.6)
Potassium: 4.5 mmol/L (ref 3.5–5.1)
Sodium: 136 mmol/L (ref 135–145)

## 2023-11-16 LAB — HEPATITIS B SURFACE ANTIGEN: Hepatitis B Surface Ag: NONREACTIVE

## 2023-11-16 MED ORDER — CHLORHEXIDINE GLUCONATE CLOTH 2 % EX PADS
6.0000 | MEDICATED_PAD | Freq: Every day | CUTANEOUS | Status: DC
  Administered 2023-11-16 – 2023-11-24 (×9): 6 via TOPICAL

## 2023-11-16 MED ORDER — ALPRAZOLAM 0.25 MG PO TABS
0.2500 mg | ORAL_TABLET | Freq: Once | ORAL | Status: AC
  Administered 2023-11-16: 0.25 mg via ORAL
  Filled 2023-11-16: qty 1

## 2023-11-16 MED ORDER — OXYCODONE HCL 5 MG PO TABS
5.0000 mg | ORAL_TABLET | ORAL | Status: AC
  Administered 2023-11-16: 5 mg via ORAL
  Filled 2023-11-16: qty 1

## 2023-11-16 MED ORDER — IPRATROPIUM-ALBUTEROL 0.5-2.5 (3) MG/3ML IN SOLN
3.0000 mL | RESPIRATORY_TRACT | Status: AC
  Filled 2023-11-16: qty 3

## 2023-11-16 MED ORDER — HEPARIN SODIUM (PORCINE) 1000 UNIT/ML IJ SOLN
10.0000 mL | Freq: Once | INTRAMUSCULAR | Status: AC
  Administered 2023-11-16: 2.6 [IU]
  Filled 2023-11-16: qty 10

## 2023-11-16 MED ORDER — LORAZEPAM 2 MG/ML IJ SOLN
0.5000 mg | Freq: Once | INTRAMUSCULAR | Status: AC
  Administered 2023-11-16: 0.5 mg via INTRAVENOUS
  Filled 2023-11-16: qty 1

## 2023-11-16 MED ORDER — LIDOCAINE HCL 1 % IJ SOLN
INTRAMUSCULAR | Status: AC
  Filled 2023-11-16: qty 20

## 2023-11-16 MED ORDER — LIDOCAINE HCL 1 % IJ SOLN
20.0000 mL | Freq: Once | INTRAMUSCULAR | Status: AC
  Administered 2023-11-16: 10 mL
  Filled 2023-11-16: qty 20

## 2023-11-16 MED ORDER — HEPARIN SODIUM (PORCINE) 1000 UNIT/ML IJ SOLN
INTRAMUSCULAR | Status: AC
  Filled 2023-11-16: qty 10

## 2023-11-16 NOTE — Progress Notes (Signed)
 Progress Note   Patient: [name] FMW:[id number] DOB: [birth year] DOA: 11/11/2023     5 DOS: the patient was seen and examined on 11/16/2023 at 7:56AM      Brief hospital course: 68 y.o. F with obesity, sCHF EF 20-25%, recent NSTEMI treated medically, DM and hx bilat BKA, HTN, CKD IIIa baseline 1.2 and hx stroke without residual who presented with SOB.  Now with renal failure       Assessment and Plan: * Severe sepsis due to community acquired pneumonia (HCC) Acute respiratory failure with hypoxia - Continue cefepime , day 6 of 7     Acute renal failure superimposed on stage 3a chronic kidney disease (HCC) Baseline creatinine appears to be around 1.2 until her recent admission for CHF and NSTEMI, at which time it peaked at greater than 2.5  This admission, has developed oliguric renal failure, likely ischemic ATN in the setting of IV contrast, sepsis, vancomycin , hypotension on 6/27 and diuresis for CHF.   FeNA >2%, UA with leukocytes.  Renal US  unremarkable.    Vanc stopped, creatinine continued trending up.  Fluids/albumin  given but Cr worsened, increased WOB.  Nephrology consulted, plan to place CVC for HD today.    UOP remains low 95cc last 24 hrs - Consult nephrology, appreciate expertise - Monitor UOP - Continue midodrine      Acute on chronic combined systolic and diastolic CHF (congestive heart failure) (HCC) Advanced HF at baseline.    Admitted on diuretics.  Cardiology consulted but have since signed off.  EF 20-25% with advanced CAD not amenable to intervention.  Creatinine now trending up, diuretics stopped.    - Hold diuretics - Hold antihypertensives    Chronic iron  deficiency anemia Hemoglobin trending down in the setting of renal failure and phlebotomy. No clinical bleeding observed or reported. - Follow Hgb   Type 2 diabetes mellitus with hyperlipidemia (HCC) Remains somewhat hyperglycemic - Hold metformin , GLP-1 - Continue low dose  glargine - Continue sliding scale corrections, renal function worsening will defer changing for now  Essential hypertension Cerebrovascular disease Hypotensive earlier in hospital stay, currently stable on midodrine  - Continue midodrine  - Hold Lasix , Isordil , hydralazine , metoprolol    HLD (hyperlipidemia) - Continue Lipitor   S/P BKA (below knee amputation) bilateral (HCC)    PAF (paroxysmal atrial fibrillation) (HCC) Previous ILR ~10 yrs ago showed no Afib.  On admission this time, transient Afib, now resolved.  In setting of very brief transient episode during sepsis, will defer [facility] for now. - Monitor on tele    Demand ischemia due to type 2 NSTEMI [facility]) Severe calcific coronary artery disease with chronic angina Evaluated by Cardiology.  They suspected type 2 NSTEMI, not ACS.   - Continue Lipitor , Plavix    Class 2 obesity BMI 38, morbid obesity in the setting of comorbid diabetes, hypertension          Subjective: The patient is weak and tired, she makes eye contact, nods head yes or no, but does not make any verbal answers.  Seems to be denying pain, shakes head no dyspnea, no discomfort.  Appears to be watching television.  No fever.  Still coughing, loose rattling cough.     Physical Exam: BP 104/62 (BP Location: Right Arm)   Pulse 93   Temp 97.8 F (36.6 C) (Oral)   Resp (!) 21   Ht 5' 6 (1.676 m)   Wt 107.1 kg   SpO2 100%   BMI 38.11 kg/m   Obese adult female, lying in bed,  appears weak and tired, watching TV Slightly tachycardic, regular, soft systolic murmur, no pitting in the bilateral lower extremity amputations, JVP not visible due to body habitus Respiratory rate elevated, overall diminished, rattling cough, but no rales or wheezes appreciated on exam Abdomen soft, no tenderness palpation Makes eye contact, nods head yes or no appropriately to questions, makes no verbal answers, severe generalized weakness bilaterally, face symmetric    Data  Reviewed: Basic metabolic panel shows creatinine up to 4.9, BUN up to 70, bicarb down to 13, stable CBC shows leukocytosis persistent at 16, hemoglobin stable at 9.7    Family Communication: Niece by phone    Disposition: Status is: Inpatient         Author: Lonni SHAUNNA Dalton, MD 11/16/2023 1:26 PM  For on call review [email or web address].

## 2023-11-16 NOTE — Inpatient Diabetes Management (Signed)
 Inpatient Diabetes Program Recommendations  AACE/ADA: New Consensus Statement on Inpatient Glycemic Control   Target Ranges:  Prepandial:   less than 140 mg/dL      Peak postprandial:   less than 180 mg/dL (1-2 hours)      Critically ill patients:  140 - 180 mg/dL    Latest Reference Range & Units 11/15/23 06:17 11/15/23 11:41 11/15/23 15:10 11/15/23 23:45 11/16/23 08:01  Glucose-Capillary 70 - 99 mg/dL 783 (H) 769 (H) 794 (H) 205 (H) 198 (H)   Review of Glycemic Control  Diabetes history: DM2 Outpatient Diabetes medications: Lantus  15 units BID, Humalog 4-14 units QAM and QPM, Trulicity 4.5 mg Qweek, Metformin  500 mg daily Current orders for Inpatient glycemic control: Semglee  6 units daily, Novolog  0-9 units TID with meal  Inpatient Diabetes Program Recommendations:    Insulin : Please consider ordering Novolog  3 units TID with meals for meal coverage if patient eats at least 50% of meals.  Thanks, Earnie Gainer, RN, MSN, CDCES Diabetes Coordinator Inpatient Diabetes Program [phone] (Team Pager from 8am to 5pm)

## 2023-11-16 NOTE — Procedures (Signed)
 Interventional Radiology Procedure Note  Procedure: Temporary non-tunneled HD catheter placement  Complications: None  Estimated Blood Loss: None  Findings: 16 cm, 13 Fr triple lumen Mahurkar catheter placed via right internal jugular vein. Tip in lower SVC. OK to use.  Marcey DASEN. Luverne, M.D Pager:  [phone]

## 2023-11-16 NOTE — Progress Notes (Addendum)
 TRH, hospitalist service, overnight cross coverage.    Received a call from bedside RN regarding the patient breathing at least 40 times per minute. Presented at bedside.  The patient is awake and nonverbal.  Use of accessory muscles to breathe.  Rhonchorous sounds on lungs auscultation diffusely.  Stat chest x-ray obtained.  It is showing infiltrates and small bilateral pleural effusions with no significant changes from previously.  Called the patient's niece, Ms. Trudy, who is involved in the patient's care.  She reports that the last time she saw the patient and heard her talk was around 7 PM last night prior to leaving the hospital.  She returned this morning around 10 AM and the patient was completely nonverbal.  When she left the hospital around 8:05 PM tonight the patient was still nonverbal.  States this is new.    MRI brain without contrast ordered to further assess the change in mental status.  Stat BiPAP ordered for increased work of breathing.  Strict n.p.o. while on BiPAP.   The patient's niece, Ms. Trudy, has requested to be updated.   Critical care time: 35 minutes.

## 2023-11-16 NOTE — TOC Progression Note (Signed)
 Transition of Care [facility]) - Progression Note    Patient Details  Name: [name] MRN: [id number] Date of Birth: 1955-08-15  [facility] of [facility]) CM/SW Contact  Luise JAYSON Pan, [place] Phone Number: 11/16/2023, 11:05 AM  Clinical Narrative:   Per daily meeting with treatment team, patient no longer has bipap needs. On 4L O2 [place]. Patient is not MR at this time. Facility notified.   TOC will continue to follow.    Expected Discharge Plan: Long Term Nursing Home Barriers to Discharge: Continued Medical Work up  Expected Discharge Plan and Services In-house Referral: Clinical Social Work     Living arrangements for the past 2 months: Skilled Nursing Facility                                       Social Determinants of Health (SDOH) Interventions SDOH Screenings   Food Insecurity: No Food Insecurity (11/12/2023)  Housing: Low Risk  (11/12/2023)  Transportation Needs: No Transportation Needs (11/12/2023)  Utilities: Not At Risk (11/12/2023)  Social Connections: Unknown (11/12/2023)  Tobacco Use: Medium Risk (11/11/2023)    Readmission Risk Interventions     No data to display

## 2023-11-16 NOTE — Plan of Care (Signed)
  Problem: Education: Goal: Ability to describe self-care measures that may prevent or decrease complications (Diabetes Survival Skills Education) will improve Outcome: Progressing   Problem: Skin Integrity: Goal: Risk for impaired skin integrity will decrease Outcome: Progressing   Problem: Tissue Perfusion: Goal: Adequacy of tissue perfusion will improve Outcome: Progressing

## 2023-11-16 NOTE — Progress Notes (Signed)
 [name] is an 68 y.o. female w/ DM with retinopathy and peripheral neuropathy, HTN, PAD with B/L AKA, HLD, CVA, CKD 3A as below who presented to ED sent from [facility] due to SOB 11/11/2023.  O2 sats were in the 70s, heart rate 130s.  Patient was initially hypotensive.  Recently was admitted in April for NSTEMI.  Admitted for severe sepsis due to CAP. Also felt to have acute on chronic syst/ diast CHF (EF25%). Creat bumped up to 2.7. Then on 4th hospital day IV vanc was stopped, IV cefepime  continued.  Renal US  was negative, IVFs were cont'd and midodrine . Not a CABG candidate.   Assessment/Plan: AKI on CKD 3b: b/l creat is 1.2- 1.7 from April - June 2025, eGFR 32- 48 ml/min. Creat here was 1.5 on admit 6/25. SP IV contrast on 6/25 w/ CTA chest. Pt was hypotensive on 6/26 and still had soft BP's on 6/27, but now is better.  UOP has been marginal (200-300 cc/d). UA shows wbc's. Urine lytes c/w ATN. Renal US  shows no obstruction. AKI likely ATN d/t contrast + hypotension. Making very low amts of urine. Maintain supportive care.  Only 95 cc of urine output over the past 24 hours; renal function and metabolic acidosis continue to worsen   I discussed with niece Tameka who has power of attorney and will request VIR temp cath to initiate dialysis.  Volume: no sig vol excess on exam, f/u CXR 6/26 looks close to normal after IV lasix . BP's are not low anymore, more low-normal  Debility: not sure what her capacity is DM2: per pmd HTN: bp's soft, getting midodrine  at 2.5 tid. Continue.  CAP: on Cefepime   Subjective: Does not look confused; denies nausea shortness of breath.   Chemistry and CBC: Creatinine  Date/Time Value Ref Range Status  12/18/2021 01:59 PM 1.11 (H) 0.44 - 1.00 mg/dL Final  98/69/7976 97:67 PM 1.16 0.60 - 1.20 mg/dL Final  92/70/7977 89:78 AM 1.31 (H) 0.44 - 1.00 mg/dL Final  88/82/7979 89:42 AM 1.27 (H) 0.44 - 1.00 mg/dL Final  95/96/7982 87:99 AM 1.2 (A) 0.5 - 1.1  mg/dL Final  97/84/7982 87:99 AM 1.0 0.5 - 1.1 mg/dL Final  91/87/7983 87:99 AM 0.9 0.5 - 1.1 mg/dL Final   Creatinine, Ser  Date/Time Value Ref Range Status  11/16/2023 03:07 AM 4.94 (H) 0.44 - 1.00 mg/dL Final  93/70/7974 95:67 AM 4.38 (H) 0.44 - 1.00 mg/dL Final  93/71/7974 96:87 AM 3.88 (H) 0.44 - 1.00 mg/dL Final  93/72/7974 88:51 AM 3.20 (H) 0.44 - 1.00 mg/dL Final  93/72/7974 96:97 AM 2.76 (H) 0.44 - 1.00 mg/dL Final    Comment:    DELTA CHECK NOTED  11/12/2023 05:00 AM 1.73 (H) 0.44 - 1.00 mg/dL Final  93/74/7974 98:81 PM 1.51 (H) 0.44 - 1.00 mg/dL Final  95/69/7974 94:99 AM 1.94 (H) 0.44 - 1.00 mg/dL Final  95/70/7974 90:99 AM 2.10 (H) 0.44 - 1.00 mg/dL Final  95/71/7974 94:99 AM 2.29 (H) 0.44 - 1.00 mg/dL Final  95/72/7974 95:69 AM 2.21 (H) 0.44 - 1.00 mg/dL Final  95/73/7974 96:40 AM 2.62 (H) 0.44 - 1.00 mg/dL Final  95/74/7974 96:82 AM 2.70 (H) 0.44 - 1.00 mg/dL Final  95/75/7974 95:84 AM 2.52 (H) 0.44 - 1.00 mg/dL Final  95/76/7974 95:61 AM 2.25 (H) 0.44 - 1.00 mg/dL Final  95/77/7974 94:59 PM 2.14 (H) 0.44 - 1.00 mg/dL Final  95/77/7974 96:74 AM 1.76 (H) 0.44 - 1.00 mg/dL Final  95/78/7974 97:87 AM 1.50 (H)  0.44 - 1.00 mg/dL Final  95/79/7974 97:57 PM 1.23 (H) 0.44 - 1.00 mg/dL Final  98/69/7979 98:43 PM 1.04 (H) 0.44 - 1.00 mg/dL Final  87/73/7980 88:85 AM 1.14 (H) 0.44 - 1.00 mg/dL Final  91/85/7983 93:54 PM 0.88 0.44 - 1.00 mg/dL Final  93/80/7984 92:66 AM 0.62 0.50 - 1.10 mg/dL Final  93/81/7984 90:89 AM 0.58 0.50 - 1.10 mg/dL Final  93/82/7984 94:64 AM 0.62 0.50 - 1.10 mg/dL Final  93/83/7984 93:74 AM 0.66 0.50 - 1.10 mg/dL Final  93/84/7984 93:69 AM 0.69 0.50 - 1.10 mg/dL Final  93/85/7984 93:94 AM 0.80 0.50 - 1.10 mg/dL Final  93/87/7984 90:67 PM 0.72 0.50 - 1.10 mg/dL Final  94/77/7984 97:83 PM 0.95 0.50 - 1.10 mg/dL Final  94/84/7984 94:54 PM 1.03 0.50 - 1.10 mg/dL Final  87/96/7985 87:54 AM 0.71 0.50 - 1.10 mg/dL Final  90/95/7985 94:53 AM 0.75 0.50 -  1.10 mg/dL Final  91/70/7985 94:89 AM 0.83 0.50 - 1.10 mg/dL Final  91/71/7985 93:92 PM 0.73 0.50 - 1.10 mg/dL Final  91/72/7985 94:44 AM 0.71 0.50 - 1.10 mg/dL Final  91/74/7985 90:57 AM 0.61 0.50 - 1.10 mg/dL Final  91/75/7985 89:77 PM 0.67 0.50 - 1.10 mg/dL Final  95/77/7985 98:67 PM 0.83 0.50 - 1.10 mg/dL Final  97/88/7985 97:41 PM 0.9 0.4 - 1.2 mg/dL Final  97/93/7985 94:57 PM 0.67 0.50 - 1.10 mg/dL Final  97/93/7985 98:68 PM 1.00 0.50 - 1.10 mg/dL Final  97/93/7985 98:73 PM 0.62 0.50 - 1.10 mg/dL Final  98/79/7985 92:45 AM 0.82 0.50 - 1.10 mg/dL Final  98/91/7985 87:87 PM 0.70 0.50 - 1.10 mg/dL Final  89/83/7986 87:51 PM 0.65 0.50 - 1.10 mg/dL Final  90/74/7986 89:54 AM 0.87 0.50 - 1.10 mg/dL Final  90/76/7986 90:84 AM 0.96 0.50 - 1.10 mg/dL Final  90/84/7986 90:85 PM 0.99 0.50 - 1.10 mg/dL Final  90/97/7986 94:99 AM 0.89 0.50 - 1.10 mg/dL Final  91/69/7986 93:77 AM 1.00 0.50 - 1.10 mg/dL Final  91/70/7986 94:44 AM 0.96 0.50 - 1.10 mg/dL Final   Recent Labs  Lab 11/11/23 1318 11/11/23 2031 11/12/23 0500 11/13/23 0302 11/13/23 1148 11/14/23 0312 11/15/23 0432 11/16/23 0307  NA 139 139 138 137  --  135 135 136  K 3.7 3.7 3.8 3.9  --  4.2 4.3 4.5  CL 103  --  106 103  --  102 104 105  CO2 18*  --  20* 16*  --  17* 13* 13*  GLUCOSE 308*  --  138* 195*  --  199* 225* 205*  BUN 25*  --  30* 41*  --  49* 62* 70*  CREATININE 1.51*  --  1.73* 2.76* 3.20* 3.88* 4.38* 4.94*  CALCIUM  8.5*  --  7.7* 7.7*  --  7.3* 7.3* 7.6*  PHOS  --   --   --   --   --   --   --  5.8*   Recent Labs  Lab 11/11/23 1318 11/11/23 2031 11/13/23 0302 11/14/23 0312 11/15/23 1527 11/16/23 0307  WBC 24.4*   < > 15.9* 14.3* 13.3* 16.4*  NEUTROABS 20.6*  --   --   --   --   --   HGB 12.3   < > 9.2* 8.5* 9.8* 9.7*  HCT 40.8   < > 31.1* 28.4* 31.9* 32.9*  MCV 86.4   < > 86.9 86.1 84.6 86.6  PLT 327   < > 215 217 218 220   < > = values in this  interval not displayed.   Liver Function  Tests: Recent Labs  Lab 11/12/23 0500 11/13/23 0302 11/16/23 0307  AST 39 32  --   ALT 16 21  --   ALKPHOS 103 100  --   BILITOT 0.6 0.8  --   PROT 6.4* 6.1*  --   ALBUMIN  2.6* 2.4* 2.5*   No results for input(s): LIPASE, AMYLASE in the last 168 hours. No results for input(s): AMMONIA in the last 168 hours. Cardiac Enzymes: No results for input(s): CKTOTAL, CKMB, CKMBINDEX, TROPONINI in the last 168 hours. Iron  Studies:  Recent Labs    11/15/23 0432  IRON  54  TIBC 192*  FERRITIN 1,093*   PT/INR: @LABRCNTIP (inr:5)  Xrays/Other Studies: ) Results for orders placed or performed during the hospital encounter of 11/11/23 (from the past 48 hours)  Glucose, capillary     Status: Abnormal   Collection Time: 11/14/23 11:41 AM  Result Value Ref Range   Glucose-Capillary 231 (H) 70 - 99 mg/dL    Comment: Glucose reference range applies only to samples taken after fasting for at least 8 hours.  Glucose, capillary     Status: Abnormal   Collection Time: 11/14/23  3:27 PM  Result Value Ref Range   Glucose-Capillary 237 (H) 70 - 99 mg/dL    Comment: Glucose reference range applies only to samples taken after fasting for at least 8 hours.  Urinalysis, Routine w reflex microscopic -Urine, Random     Status: Abnormal   Collection Time: 11/14/23  4:41 PM  Result Value Ref Range   Color, Urine YELLOW YELLOW   APPearance CLOUDY (A) CLEAR   Specific Gravity, Urine >1.030 (H) 1.005 - 1.030   pH 5.0 5.0 - 8.0   Glucose, UA 100 (A) NEGATIVE mg/dL   Hgb urine dipstick SMALL (A) NEGATIVE   Bilirubin Urine NEGATIVE NEGATIVE   Ketones, ur 15 (A) NEGATIVE mg/dL   Protein, ur >=699 NEGATIVE mg/dL   Nitrite NEGATIVE NEGATIVE   Leukocytes,Ua SMALL (A) NEGATIVE    Comment: Performed at [facility], [address], [place], [place] [address]  Creatinine, urine, random     Status: None   Collection Time: 11/14/23  4:41 PM  Result Value Ref Range   Creatinine, Urine 326  mg/dL    Comment: Performed at [facility], [address], [place], [place] [address]  Sodium, urine, random     Status: None   Collection Time: 11/14/23  4:41 PM  Result Value Ref Range   Sodium, Ur 79 mmol/L    Comment: Performed at [facility], [address] [place], [place] [address]  Urinalysis, Microscopic (reflex)     Status: Abnormal   Collection Time: 11/14/23  4:41 PM  Result Value Ref Range   RBC / HPF 0-5 0 - 5 RBC/hpf   WBC, UA >50 0 - 5 WBC/hpf   Bacteria, UA MANY (A) NONE SEEN   Squamous Epithelial / HPF 0-5 0 - 5 /HPF   Ca Oxalate Crys, UA PRESENT    Urine-Other LESS THAN 10 mL OF URINE SUBMITTED     Comment: MICROSCOPIC EXAM PERFORMED ON UNCONCENTRATED URINE Performed at [facility], [address] [place], [place] [address]   Glucose, capillary     Status: Abnormal   Collection Time: 11/14/23  9:42 PM  Result Value Ref Range   Glucose-Capillary 231 (H) 70 - 99 mg/dL    Comment: Glucose reference range applies only to samples  taken after fasting for at least 8 hours.  Ferritin     Status: Abnormal   Collection Time: 11/15/23  4:32 AM  Result Value Ref Range   Ferritin 1,093 (H) 11 - 307 ng/mL    Comment: Performed at [facility], [address], [place], [place] [address]  Iron  and TIBC     Status: Abnormal   Collection Time: 11/15/23  4:32 AM  Result Value Ref Range   Iron  54 28 - 170 ug/dL   TIBC 807 (L) 749 - 549 ug/dL   Saturation Ratios 28 10.4 - 31.8 %   UIBC 138 ug/dL    Comment: Performed at [facility], [address] [place], [place] [address]  Basic metabolic panel     Status: Abnormal   Collection Time: 11/15/23  4:32 AM  Result Value Ref Range   Sodium 135 135 - 145 mmol/L   Potassium 4.3 3.5 - 5.1 mmol/L   Chloride 104 98 - 111 mmol/L   CO2 13 (L) 22 - 32 mmol/L   Glucose, Bld 225 (H) 70 - 99 mg/dL    Comment: Glucose reference range applies only to samples taken after fasting for at least 8 hours.    BUN 62 (H) 8 - 23 mg/dL   Creatinine, Ser 5.61 (H) 0.44 - 1.00 mg/dL   Calcium  7.3 (L) 8.9 - 10.3 mg/dL   GFR, Estimated 10 (L) >60 mL/min    Comment: (NOTE) Calculated using the CKD-EPI Creatinine Equation (2021)    Anion gap 18 (H) 5 - 15    Comment: Performed at [facility], [address], [place], [place] [address]  Glucose, capillary     Status: Abnormal   Collection Time: 11/15/23  6:17 AM  Result Value Ref Range   Glucose-Capillary 216 (H) 70 - 99 mg/dL    Comment: Glucose reference range applies only to samples taken after fasting for at least 8 hours.  Glucose, capillary     Status: Abnormal   Collection Time: 11/15/23 11:41 AM  Result Value Ref Range   Glucose-Capillary 230 (H) 70 - 99 mg/dL    Comment: Glucose reference range applies only to samples taken after fasting for at least 8 hours.  Glucose, capillary     Status: Abnormal   Collection Time: 11/15/23  3:10 PM  Result Value Ref Range   Glucose-Capillary 205 (H) 70 - 99 mg/dL    Comment: Glucose reference range applies only to samples taken after fasting for at least 8 hours.  CBC     Status: Abnormal   Collection Time: 11/15/23  3:27 PM  Result Value Ref Range   WBC 13.3 (H) 4.0 - 10.5 K/uL   RBC 3.77 (L) 3.87 - 5.11 MIL/uL   Hemoglobin 9.8 (L) 12.0 - 15.0 g/dL   HCT 68.0 (L) 63.9 - 53.9 %   MCV 84.6 80.0 - 100.0 fL   MCH 26.0 26.0 - 34.0 pg   MCHC 30.7 30.0 - 36.0 g/dL   RDW 80.3 (H) 88.4 - 84.4 %   Platelets 218 150 - 400 K/uL   nRBC 0.7 (H) 0.0 - 0.2 %    Comment: Performed at [facility], [address], [place], [place] [address]  Glucose, capillary     Status: Abnormal   Collection Time: 11/15/23 11:45 PM  Result Value Ref Range   Glucose-Capillary 205 (H) 70 - 99 mg/dL    Comment: Glucose reference range applies only to  samples taken after fasting for at least 8 hours.  CBC     Status: Abnormal   Collection Time: 11/16/23  3:07 AM  Result Value Ref Range   WBC 16.4 (H) 4.0 - 10.5  K/uL   RBC 3.80 (L) 3.87 - 5.11 MIL/uL   Hemoglobin 9.7 (L) 12.0 - 15.0 g/dL   HCT 67.0 (L) 63.9 - 53.9 %   MCV 86.6 80.0 - 100.0 fL   MCH 25.5 (L) 26.0 - 34.0 pg   MCHC 29.5 (L) 30.0 - 36.0 g/dL   RDW 80.3 (H) 88.4 - 84.4 %   Platelets 220 150 - 400 K/uL   nRBC 1.0 (H) 0.0 - 0.2 %    Comment: Performed at [facility], [address], [place], [place] [address]  Renal function panel     Status: Abnormal   Collection Time: 11/16/23  3:07 AM  Result Value Ref Range   Sodium 136 135 - 145 mmol/L   Potassium 4.5 3.5 - 5.1 mmol/L   Chloride 105 98 - 111 mmol/L   CO2 13 (L) 22 - 32 mmol/L   Glucose, Bld 205 (H) 70 - 99 mg/dL    Comment: Glucose reference range applies only to samples taken after fasting for at least 8 hours.   BUN 70 (H) 8 - 23 mg/dL   Creatinine, Ser 5.05 (H) 0.44 - 1.00 mg/dL   Calcium  7.6 (L) 8.9 - 10.3 mg/dL   Phosphorus 5.8 (H) 2.5 - 4.6 mg/dL   Albumin  2.5 (L) 3.5 - 5.0 g/dL   GFR, Estimated 9 (L) >60 mL/min    Comment: (NOTE) Calculated using the CKD-EPI Creatinine Equation (2021)    Anion gap 18 (H) 5 - 15    Comment: Performed at [facility], [address] [place], [place] [address]  Glucose, capillary     Status: Abnormal   Collection Time: 11/16/23  8:01 AM  Result Value Ref Range   Glucose-Capillary 198 (H) 70 - 99 mg/dL    Comment: Glucose reference range applies only to samples taken after fasting for at least 8 hours.   US  RENAL Result Date: 11/14/2023 CLINICAL DATA:  AKI EXAM: RENAL / URINARY TRACT ULTRASOUND COMPLETE COMPARISON:  CT abdomen pelvis 06/09/2018 FINDINGS: Right Kidney: Renal measurements: 11.8 x 5.5 x 5.6 cm = volume: 192 mL. Mildly increased cortical echogenicity. Possible 9 mm nonobstructing calculus in the right kidney. No hydronephrosis. Left Kidney: Renal measurements: 9.6 x 5.8 x 5.0 cm = volume: 145 mL. Mildly increased echogenicity. No mass or hydronephrosis. Bladder: Appears normal for degree of bladder  distention. Other: None. IMPRESSION: 1. Mildly increased cortical echogenicity bilaterally as can be seen with medical renal disease. 2. Possible 9 mm nonobstructing calculus in the right kidney. Electronically Signed   By: Norman Gatlin M.D.   On: 11/14/2023 19:52    PMH:   Past Medical History:  Diagnosis Date   Atypical chest pain    a. non obstructive by cath in 2008 and 2010;  b. 02/2012 Myoview : non-ischemic, EF 57%   Cellulitis    a. left foot-> s/p L BKA   Chronic kidney disease    Constipation    CVA (cerebral infarction)    a.  Small right parietal noted incidentally 04/2007;  b. right sided embolic CVA 05/2012;  c. TEE 2/14:  LVH, EF 55-60%, mild LAE, no LAA clot, no PFO, no R->L shunt by echo contrast, oscillating density on AV likely Lambl's Excressence  Diabetes mellitus, type II (HCC)    Diabetic retinopathy (HCC)    NPDR w/edema OU   Diaphragmatic hernia without mention of obstruction or gangrene    Dry skin    Esophageal reflux    HTN (hypertension)    Hx of BKA (HCC)    Bilateral   Hyperlipidemia    Irritable bowel syndrome    Morbid obesity (HCC)    Obesity, unspecified    Pain in shoulder 06/28/2015   Peripheral neuropathy    Stroke [facility])    2013-'no residual'   Syncope and collapse    a. near-syncopal episode in November 2008;  b. 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   Tobacco abuse    Vertigo     PSH:   Past Surgical History:  Procedure Laterality Date   AMPUTATION  12/30/2011   Procedure: AMPUTATION RAY;  Surgeon: Norleen Armor, MD;  Location: [facility] OR;  Service: Orthopedics;  Laterality: Left;  LEFT FIRST RAY AMPUTATION   AMPUTATION  01/13/2012   Procedure: AMPUTATION BELOW KNEE;  Surgeon: Norleen Armor, MD;  Location: [facility] OR;  Service: Orthopedics;  Laterality: Left;   AMPUTATION  03/04/2012   Procedure: AMPUTATION BELOW KNEE;  Surgeon: Norleen Armor, MD;  Location: [facility] OR;  Service: Orthopedics;  Laterality: Left;  Revision of Left Below Knee Amputation    AMPUTATION Right 11/02/2013   Procedure: AMPUTATION BELOW KNEE;  Surgeon: Jerona Harden GAILS, MD;  Location: [facility] OR;  Service: Orthopedics;  Laterality: Right;  Right Below Knee Amputation   BIOPSY  03/29/2019   Procedure: BIOPSY;  Surgeon: San Sandor GAILS, DO;  Location: [facility] ENDOSCOPY;  Service: Gastroenterology;;  EGD and Colon   CARDIAC CATHETERIZATION     LAD 30%, circumflex 50%, OM 75%, RI 60% with small branch 80%, dominant RCA 60%, EF 45-50%   CHOLECYSTECTOMY     COLONOSCOPY     COLONOSCOPY WITH PROPOFOL  N/A 03/29/2019   Procedure: COLONOSCOPY WITH PROPOFOL ;  Surgeon: San Sandor GAILS, DO;  Location: [facility];  Service: Gastroenterology;  Laterality: N/A;   ESOPHAGOGASTRODUODENOSCOPY (EGD) WITH PROPOFOL  N/A 03/29/2019   Procedure: ESOPHAGOGASTRODUODENOSCOPY (EGD) WITH PROPOFOL ;  Surgeon: San Sandor GAILS, DO;  Location: [facility];  Service: Gastroenterology;  Laterality: N/A;   EYE SURGERY Right    cataract   I & D EXTREMITY  12/23/2011   Procedure: IRRIGATION AND DEBRIDEMENT EXTREMITY;  Surgeon: Elspeth JONELLE Her, MD;  Location: [facility] OR;  Service: Orthopedics;  Laterality: Left;  Left Foot   I & D EXTREMITY  12/26/2011   Procedure: IRRIGATION AND DEBRIDEMENT EXTREMITY;  Surgeon: Norleen Armor, MD;  Location: [facility] OR;  Service: Orthopedics;  Laterality: Left;  LEFT FOOT I&D WITH POSSIBLE WOUND VAC APPLICATION, POSSIBLE LEFT FIRST RAY AMPUTATION   I & D EXTREMITY Right 10/29/2013   Procedure: IRRIGATION AND DEBRIDEMENT EXTREMITY;  Surgeon: Reyes JAYSON Billing, MD;  Location: [facility] OR;  Service: Orthopedics;  Laterality: Right;   Invasive Electrophysiologic Study  5/09   followed by insertion of an implantable loop recorder. S/p removal    Multiple Toe Surgeries     POLYPECTOMY  03/29/2019   Procedure: POLYPECTOMY;  Surgeon: San Sandor GAILS, DO;  Location: [facility];  Service: Gastroenterology;;   RIGHT/LEFT HEART CATH AND CORONARY ANGIOGRAPHY N/A 09/07/2023   Procedure: RIGHT/LEFT HEART  CATH AND CORONARY ANGIOGRAPHY;  Surgeon: Anner Alm ORN, MD;  Location: [facility] INVASIVE CV LAB;  Service: Cardiovascular;  Laterality: N/A;   TEE WITHOUT CARDIOVERSION N/A 07/07/2012   Procedure: TRANSESOPHAGEAL ECHOCARDIOGRAM (TEE);  Surgeon: Redell  GORMAN Shallow, MD;  Location: [facility];  Service: Cardiovascular;  Laterality: N/A;    Allergies:  Allergies  Allergen Reactions   Banana Swelling and Other (See Comments)    Facial swelling   Penicillins Anaphylaxis, Itching and Rash   Strawberry Extract Swelling    Facial swelling    Medications:   Prior to Admission medications   Medication Sig Start Date End Date Taking? Authorizing Provider  albuterol  (VENTOLIN  HFA) 108 (90 Base) MCG/ACT inhaler Inhale 2 puffs into the lungs in the morning and at bedtime.   Yes [provider]  aspirin  EC 81 MG tablet Take 81 mg by mouth daily.   Yes [provider]  atorvastatin  (LIPITOR ) 80 MG tablet Take 1 tablet (80 mg total) by mouth daily. 09/17/23  Yes Arrien, Elidia Sieving, MD  chlorhexidine  (PERIDEX ) 0.12 % solution Use as directed 5 mLs in the mouth or throat 2 (two) times daily.   Yes [provider]  Cholecalciferol 1.25 MG (50000 UT) TABS Take 50,000 Units by mouth once a week. Take one tablet by mouth every Monday.   Yes [provider]  clopidogrel  (PLAVIX ) 75 MG tablet Take 1 tablet (75 mg total) by mouth daily. 09/17/23  Yes Arrien, Mauricio Daniel, MD  Dulaglutide 3 MG/0.5ML SOAJ Inject 4.5 mg into the skin once a week. Every Wednesday   Yes [provider]  ferrous sulfate  325 (65 FE) MG tablet Take 325 mg by mouth 2 (two) times daily.   Yes [provider]  furosemide  (LASIX ) 80 MG tablet Take 1 tablet (80 mg total) by mouth daily. 09/17/23  Yes Arrien, Elidia Sieving, MD  hydrALAZINE  (APRESOLINE ) 25 MG tablet Take 1 tablet (25 mg total) by mouth every 8 (eight) hours. 09/16/23  Yes Arrien, Mauricio Daniel, MD  Insulin  Glargine (BASAGLAR   Operating Room Services) 100 UNIT/ML Inject 15 Units into the skin 2 (two) times daily.   Yes [provider]  insulin  lispro (HUMALOG) 100 UNIT/ML injection Inject 4-14 Units into the skin See admin instructions. Inject 14 units into the skin two times daily in addition to sliding scale 150-200=4 units,201-250 = 8 units, 251-300 = 8 units, 301-350= 10 units, 351-400= 10 units, 401-450=14 units, 451-500 =12 units >500 = 14 units   Yes [provider]  ipratropium-albuterol  (DUONEB) 0.5-2.5 (3) MG/3ML SOLN Inhale 3 mLs into the lungs every 8 (eight) hours as needed (SOB/wheezing). 09/20/23  Yes [provider]  isosorbide  dinitrate (ISORDIL ) 20 MG tablet Take 1 tablet (20 mg total) by mouth 2 (two) times daily. 09/16/23  Yes Arrien, Mauricio Daniel, MD  loratadine (CLARITIN) 10 MG tablet Take 10 mg by mouth daily.   Yes [provider]  magnesium  hydroxide (MILK OF MAGNESIA) 400 MG/5ML suspension Take 30 mLs by mouth daily.   Yes [provider]  metFORMIN  (GLUCOPHAGE ) 500 MG tablet Take 500 mg by mouth daily. 07/13/23  Yes [provider]  Metoprolol  Tartrate 37.5 MG TABS Take 1 tablet by mouth daily. Hold for SBP<110 or pulse <80 10/10/23  Yes [provider]  nystatin (MYCOSTATIN/NYSTOP) powder Apply 1 Application topically 3 (three) times daily. Apply to skin folds/ R side neck topically threes times a day for candidiasis. Apply to reddened areas on inner elbows and abdominal fold/R side of neck   Yes [provider]  Olopatadine HCl 0.7 % SOLN Place 1 drop into both eyes daily.   Yes [provider]  polyethylene glycol (MIRALAX  / GLYCOLAX ) packet Take 17 g by  mouth daily as needed for moderate constipation. 11/04/13  Yes Rizwan, Saima, MD  senna-docusate (SENOKOT-S) 8.6-50 MG tablet Take 1 tablet by mouth daily.   Yes [provider]  tetrahydrozoline 0.05 % ophthalmic solution Place 1 drop into both eyes at bedtime.   Yes [provider]  Tetrahydrozoline HCl (VISINE OP) Place 1 drop into both eyes at bedtime.   Yes [provider]  vitamin C  (ASCORBIC ACID) 500 MG tablet Take 500 mg by mouth daily.   Yes [provider]  Zinc  Oxide 40 % PSTE Apply 1 Application topically 2 (two) times daily. Apply to sacrum topically every day and night shift; may also apply with each incontinent episode/   Yes [provider]  OXYGEN Inhale 2 L into the lungs as needed (Dyspnea).    [provider]    Discontinued Meds:   Medications Discontinued During This Encounter  Medication Reason   LANTUS  100 UNIT/ML injection Change in therapy   ergocalciferol (VITAMIN D2) 1.25 MG (50000 UT) capsule Change in therapy   PAZEO 0.7 % SOLN Change in therapy   Bevacizumab  (AVASTIN ) SOLN 1.25 mg Completed Course   Bevacizumab  (AVASTIN ) SOLN 1.25 mg Completed Course   Bevacizumab  (AVASTIN ) SOLN 1.25 mg Completed Course   Bevacizumab  (AVASTIN ) SOLN 1.25 mg Completed Course   metroNIDAZOLE  (FLAGYL ) IVPB 500 mg    metoprolol  succinate (TOPROL -XL) 25 MG 24 hr tablet Change in therapy   acetaminophen  (ACETAMINOPHEN  8 HOUR) 650 MG CR tablet Discontinued by provider   Loperamide HCl (IMODIUM PO) Discontinued by provider   Sodium Fluoride 1.1 % PSTE Discontinued by provider   Cholecalciferol (VITAMIN D) 50 MCG (2000 UT) tablet Dose change   LANTUS  SOLOSTAR 100 UNIT/ML Solostar Pen Change in therapy   albuterol  (PROVENTIL ) (2.5 MG/3ML) 0.083% nebulizer solution 2.5 mg    vancomycin  (VANCOREADY) IVPB 1750 mg/350 mL    furosemide  (LASIX ) injection 80 mg    ceFEPIme  (MAXIPIME ) 2 g in sodium chloride  0.9 % 100 mL IVPB    heparin  ADULT infusion 100 units/mL (25000 units/250mL)    morphine  (PF) 2 MG/ML injection 2 mg    0.9 %  sodium chloride  infusion    sodium chloride  HYPERTONIC 3 % nebulizer solution 4 mL     Social History:  reports that she has quit smoking. Her smoking use included cigarettes. She has a  10 pack-year smoking history. She has never used smokeless tobacco. She reports current alcohol use of about 4.0 standard drinks of alcohol per week. She reports that she does not use drugs.  Family History:   Family History  Problem Relation Age of Onset   Pneumonia Mother    Gallbladder disease Mother        cancer   Heart failure Mother    Diabetes Father    Coronary artery disease Father    Stroke Neg Hx     Blood pressure (!) 116/52, pulse 94, temperature 97.8 F (36.6 C), temperature source Oral, resp. rate 20, height 5' 6 (1.676 m), weight 107.1 kg, SpO2 97%. Physical Exam: Gen alert, NAD, [place] O2 No rash, cyanosis or gangrene Sclera anicteric, throat clear  No jvd or bruits Chest clear bilat to bases, no rales/ wheezing RRR no MRG Abd soft ntnd no mass or ascites +bs GU no foley Ext bilat BKA, 1+ bilateral hip edema, no UE edema Neuro is awake, disoriented and confused      MELIA LYNWOOD ORN, MD 11/16/2023, 10:13 AM

## 2023-11-17 ENCOUNTER — Inpatient Hospital Stay (HOSPITAL_COMMUNITY)

## 2023-11-17 ENCOUNTER — Encounter (HOSPITAL_COMMUNITY)

## 2023-11-17 ENCOUNTER — Other Ambulatory Visit (HOSPITAL_COMMUNITY)

## 2023-11-17 DIAGNOSIS — I639 Cerebral infarction, unspecified: Secondary | ICD-10-CM

## 2023-11-17 DIAGNOSIS — I4891 Unspecified atrial fibrillation: Secondary | ICD-10-CM | POA: Diagnosis not present

## 2023-11-17 DIAGNOSIS — R29705 NIHSS score 5: Secondary | ICD-10-CM

## 2023-11-17 DIAGNOSIS — I6381 Other cerebral infarction due to occlusion or stenosis of small artery: Secondary | ICD-10-CM | POA: Insufficient documentation

## 2023-11-17 DIAGNOSIS — E1151 Type 2 diabetes mellitus with diabetic peripheral angiopathy without gangrene: Secondary | ICD-10-CM

## 2023-11-17 DIAGNOSIS — R29715 NIHSS score 15: Secondary | ICD-10-CM

## 2023-11-17 DIAGNOSIS — R6520 Severe sepsis without septic shock: Secondary | ICD-10-CM | POA: Diagnosis not present

## 2023-11-17 DIAGNOSIS — I69391 Dysphagia following cerebral infarction: Secondary | ICD-10-CM

## 2023-11-17 DIAGNOSIS — J189 Pneumonia, unspecified organism: Secondary | ICD-10-CM | POA: Diagnosis not present

## 2023-11-17 DIAGNOSIS — J9690 Respiratory failure, unspecified, unspecified whether with hypoxia or hypercapnia: Secondary | ICD-10-CM

## 2023-11-17 DIAGNOSIS — I509 Heart failure, unspecified: Secondary | ICD-10-CM

## 2023-11-17 DIAGNOSIS — A419 Sepsis, unspecified organism: Secondary | ICD-10-CM | POA: Diagnosis not present

## 2023-11-17 DIAGNOSIS — I6389 Other cerebral infarction: Secondary | ICD-10-CM | POA: Diagnosis not present

## 2023-11-17 DIAGNOSIS — N179 Acute kidney failure, unspecified: Secondary | ICD-10-CM

## 2023-11-17 DIAGNOSIS — R569 Unspecified convulsions: Secondary | ICD-10-CM

## 2023-11-17 DIAGNOSIS — N1831 Chronic kidney disease, stage 3a: Secondary | ICD-10-CM

## 2023-11-17 LAB — BLOOD GAS, ARTERIAL
Acid-base deficit: 11 mmol/L — ABNORMAL HIGH (ref 0.0–2.0)
Bicarbonate: 14.7 mmol/L — ABNORMAL LOW (ref 20.0–28.0)
Drawn by: 252031
O2 Saturation: 98.3 %
Patient temperature: 36.9
pCO2 arterial: 32 mmHg (ref 32–48)
pH, Arterial: 7.27 — ABNORMAL LOW (ref 7.35–7.45)
pO2, Arterial: 108 mmHg (ref 83–108)

## 2023-11-17 LAB — CBC
HCT: 28.9 % — ABNORMAL LOW (ref 36.0–46.0)
Hemoglobin: 8.6 g/dL — ABNORMAL LOW (ref 12.0–15.0)
MCH: 26.5 pg (ref 26.0–34.0)
MCHC: 29.8 g/dL — ABNORMAL LOW (ref 30.0–36.0)
MCV: 88.9 fL (ref 80.0–100.0)
Platelets: 201 10*3/uL (ref 150–400)
RBC: 3.25 MIL/uL — ABNORMAL LOW (ref 3.87–5.11)
RDW: 19.6 % — ABNORMAL HIGH (ref 11.5–15.5)
WBC: 14.4 10*3/uL — ABNORMAL HIGH (ref 4.0–10.5)
nRBC: 3 % — ABNORMAL HIGH (ref 0.0–0.2)

## 2023-11-17 LAB — RENAL FUNCTION PANEL
Albumin: 2.3 g/dL — ABNORMAL LOW (ref 3.5–5.0)
Anion gap: 18 — ABNORMAL HIGH (ref 5–15)
BUN: 80 mg/dL — ABNORMAL HIGH (ref 8–23)
CO2: 16 mmol/L — ABNORMAL LOW (ref 22–32)
Calcium: 7.3 mg/dL — ABNORMAL LOW (ref 8.9–10.3)
Chloride: 104 mmol/L (ref 98–111)
Creatinine, Ser: 5.94 mg/dL — ABNORMAL HIGH (ref 0.44–1.00)
GFR, Estimated: 7 mL/min — ABNORMAL LOW (ref >60)
Glucose, Bld: 240 mg/dL — ABNORMAL HIGH (ref 70–99)
Phosphorus: 6.4 mg/dL — ABNORMAL HIGH (ref 2.5–4.6)
Potassium: 4.5 mmol/L (ref 3.5–5.1)
Sodium: 138 mmol/L (ref 135–145)

## 2023-11-17 LAB — LIPID PANEL
Cholesterol: 80 mg/dL (ref 0–200)
HDL: 29 mg/dL — ABNORMAL LOW (ref >40)
LDL Cholesterol: 34 mg/dL (ref 0–99)
Total CHOL/HDL Ratio: 2.8 ratio
Triglycerides: 84 mg/dL (ref <150)
VLDL: 17 mg/dL (ref 0–40)

## 2023-11-17 LAB — GLUCOSE, CAPILLARY
Glucose-Capillary: 237 mg/dL — ABNORMAL HIGH (ref 70–99)
Glucose-Capillary: 139 mg/dL — ABNORMAL HIGH (ref 70–99)
Glucose-Capillary: 114 mg/dL — ABNORMAL HIGH (ref 70–99)

## 2023-11-17 LAB — HEPATITIS B SURFACE ANTIBODY, QUANTITATIVE: Hep B S AB Quant (Post): <3.5 m[IU]/mL — ABNORMAL LOW

## 2023-11-17 LAB — HEMOGLOBIN A1C
Hgb A1c MFr Bld: 7 % — ABNORMAL HIGH (ref 4.8–5.6)
Mean Plasma Glucose: 154.2 mg/dL

## 2023-11-17 MED ORDER — INSULIN ASPART 100 UNIT/ML IJ SOLN
0.0000 [IU] | Freq: Every day | INTRAMUSCULAR | Status: DC
  Administered 2023-11-19: 2 [IU] via SUBCUTANEOUS
  Administered 2023-11-20: 5 [IU] via SUBCUTANEOUS
  Administered 2023-11-21: 4 [IU] via SUBCUTANEOUS
  Administered 2023-11-22: 3 [IU] via SUBCUTANEOUS

## 2023-11-17 MED ORDER — STROKE: EARLY STAGES OF RECOVERY BOOK
Freq: Once | Status: DC
  Filled 2023-11-17: qty 1

## 2023-11-17 MED ORDER — INSULIN ASPART 100 UNIT/ML IJ SOLN
0.0000 [IU] | Freq: Three times a day (TID) | INTRAMUSCULAR | Status: DC
  Administered 2023-11-17: 2 [IU] via SUBCUTANEOUS
  Administered 2023-11-19 (×2): 3 [IU] via SUBCUTANEOUS
  Administered 2023-11-19 – 2023-11-20 (×2): 15 [IU] via SUBCUTANEOUS
  Administered 2023-11-20: 2 [IU] via SUBCUTANEOUS
  Administered 2023-11-20: 5 [IU] via SUBCUTANEOUS
  Administered 2023-11-21: 8 [IU] via SUBCUTANEOUS
  Administered 2023-11-21 (×2): 11 [IU] via SUBCUTANEOUS
  Administered 2023-11-22: 8 [IU] via SUBCUTANEOUS
  Administered 2023-11-22: 11 [IU] via SUBCUTANEOUS
  Administered 2023-11-22: 5 [IU] via SUBCUTANEOUS
  Administered 2023-11-23: 8 [IU] via SUBCUTANEOUS
  Administered 2023-11-23: 5 [IU] via SUBCUTANEOUS
  Administered 2023-11-23: 3 [IU] via SUBCUTANEOUS
  Administered 2023-11-24: 5 [IU] via SUBCUTANEOUS

## 2023-11-17 MED ORDER — ASPIRIN 300 MG RE SUPP
300.0000 mg | Freq: Once | RECTAL | Status: AC
  Administered 2023-11-17: 300 mg via RECTAL
  Filled 2023-11-17: qty 1

## 2023-11-17 MED ORDER — INSULIN GLARGINE-YFGN 100 UNIT/ML ~~LOC~~ SOLN
9.0000 [IU] | Freq: Every day | SUBCUTANEOUS | Status: DC
  Administered 2023-11-19 – 2023-11-20 (×2): 9 [IU] via SUBCUTANEOUS
  Filled 2023-11-17 (×4): qty 0.09

## 2023-11-17 MED ORDER — SODIUM BICARBONATE 8.4 % IV SOLN
50.0000 meq | INTRAVENOUS | Status: AC
  Administered 2023-11-17: 50 meq via INTRAVENOUS
  Filled 2023-11-17: qty 50

## 2023-11-17 MED ORDER — ASPIRIN 325 MG PO TABS: 325.0000 mg | ORAL_TABLET | Freq: Once | ORAL | Status: DC

## 2023-11-17 NOTE — Progress Notes (Signed)
 Patient with increased WOB, increased RR, and use of accessory muscles, provided notified. Provider at bedside. Patient placed on Bipap by RT.   2330- patient to MRI via transport, taken off Bipap and placed on [facility] per provider.Patient shortly returned to unit, per transport at bedside radiology was unable to complete exam due to increased WOB. Provider notified. Patient placed back on Bipap.  0115- patient transported to CT on Bipap with RT and RN  0136- ABG obtained by RT, amp of bicarb given per orders   0405- patient becoming agitated with Bipap, uncomfortable and restless. Provider notified. Per provider, patient to taken off bipap and placed back on [place]. Provider at bedside to assess.   0600- provider notified of patients continued and worsening WOB, per orders patient placed back on Bipap   0630- patient restless and anxious, provider notified. Awaiting orders.

## 2023-11-17 NOTE — Procedures (Signed)
 Patient Name: [name]  MRN: [id number]  Epilepsy Attending: Arlin MALVA Krebs  Referring Physician/Provider: everitt Clint Abbey Earle FORBES, NP  Date: 11/17/2023 Duration: 25.36 mins  Patient history: 89y F with ams. EEG to evaluate for seizure  Level of alertness: Awake  AEDs during EEG study: None  Technical aspects: This EEG study was done with scalp electrodes positioned according to the 10-20 International system of electrode placement. Electrical activity was reviewed with band pass filter of 1-70Hz , sensitivity of 7 uV/mm, display speed of 7mm/sec with a 60Hz  notched filter applied as appropriate. EEG data were recorded continuously and digitally stored.  Video monitoring was available and reviewed as appropriate.  Description: EEG showed continuous generalized predominantly 5 to 6 Hz theta slowing admixed with intermittent 2-3hz  delta slowing. Hyperventilation and photic stimulation were not performed.     ABNORMALITY - Continuous slow, generalized  IMPRESSION: This study is suggestive of moderate diffuse encephalopathy. No seizures or epileptiform discharges were seen throughout the recording.  [name] O [place] Curl

## 2023-11-17 NOTE — Progress Notes (Addendum)
 Progress Note   Patient: [name] FMW:[id number] DOB: [birth year] DOA: 11/11/2023     6 DOS: the patient was seen and examined on 11/17/2023 at 10:30AM and 2:45PM      Brief hospital course: 68 y.o. F with obesity, sCHF EF 20-25%, recent NSTEMI treated medically, DM and hx bilat BKA, HTN, CKD IIIa baseline 1.2 and hx stroke without residual who presented with SOB.  Recently admitted for NSTEMI/CHF, underwent LHC that showed severe calcific disease, 80% mid-LAD, 90% prox RCA and 90% ramus intermedius, no PCI due to technically challenging targets, not CABG candidate.  EF 20-25%.  Recovered afterwards and was feeling well, until a day or two before admission, became SOB, sent from facility to hospital.     In the ER, CXR showed bilateral opacities. Lactate >5.  New atrial fibrillation.  Suspected CHF, pneumonia sepsis, started on antibiotics, diuretics, Cardiology consulted.     Subsequent hospital course complicated by renal failure.  CVC placed 6/30, initiated dialysis 7/1.       Assessment and Plan: * Severe sepsis due to community acquired pneumonia (HCC) Acute respiratory failure with hypoxia Initial presenting issue, with tachycardia, tachypnea, leukocytosis, and respiratory failure with lactic acid greater than 5.  Chest x-ray showed bilateral infiltrates.  Procalcitonin 0.6, trended up to >2. - Continue cefepime , day 7 - Bronchodilators - Aggressive pulmonary toilet - Continue midodrine     Acute renal failure superimposed on stage 3a chronic kidney disease (HCC) Baseline creatinine appears to be around 1.2 until her recent admission for CHF and NSTEMI, at which time it peaked at greater than 2.5  This admission, was 1.5 on admission, but developed oliguric renal failure, likely ischemic ATN in the setting of IV contrast, sepsis, vancomycin , hypotension on 6/27 and diuresis for CHF.   FeNA >2%, UA with leukocytes.  Renal US  unremarkable.    Vanc stopped,  creatinine continued trending up.  Fluids/albumin  given but Cr worsened, increased WOB.  Nephrology consulted.  CVC placed 6/30.  Underwent first HD 7/1  No UOP charted last 24 hours - Consult Nephrology for HD, appreciate expertise - I/Os - Consult palliative care - Continue midodrine      Acute on chronic combined systolic and diastolic CHF (congestive heart failure) (HCC) Admitted with bilateral infiltrates, BNP >1000, known EF 20-25%, started on diuretics.  Cardiology consulted.    Diuretics stopped in setting of worsening Cr.   I do not think she is primarily in cardiogenic shock, given she tolerated IHD today, but I am not sure. - Hold diuretics - Hold antihypertensives - I have reached out to re-engage Cardiology today, with question: how best to estimate her cardiac output given her comorbidities    ADDENDUM: Discussed with advanced HF team.  Given her multi-organ failure, there are no interventions they can recommend at this time that would safely reasonably offer to reverse her HF/ischemic cardiomyopathy.    Demand ischemia due to type 2 NSTEMI [facility]) Severe calcific coronary artery disease with chronic angina Troponin elevation on admission low, suspected this was type 2 NSTEMI, not ACS.   - Continue Lipitor , Plavix    PAF (paroxysmal atrial fibrillation) (HCC) Previous ILR ~10 yrs ago showed no Afib.  On admission this time, transient Afib, which resolved quickly.  We were planning to defer anticoagulation given her brief sepsis-mediated episode, I do not personally feel the small lacunar infarct requires anticoagulation.   - Monitor on tele  Lacunar infarct Acute metabolic encephalopathy At baseline, patient has no known cognitive impairment.  Here, she has developed in the last 48 hours new decreased verbal output, more sleepiness, less responsiveness.  Overnight 6/30, there was concern this was new aphasia from possible stroke, so MRI brain was obtained that showed  small lacunar infarct.    Consensus from neurology is that infarct likely incidental.  Neurology recommend aspirin  be added to chronic Plavix .   - I will hold aspirin  given primary issue of renal failure. - Continue insulin  mgmt - Continue statin - Follow TCD - Follow carotid US    Chronic iron  deficiency anemia Hemoglobin trending down in the setting of renal failure and phlebotomy. No clinical bleeding observed or reported.  Iron  replete. - Follow Hgb   Type 2 diabetes mellitus with hyperlipidemia (HCC) Remains somewhat hyperglycemic - Hold metformin , GLP-1 - Continue glargine, increase dose - Continue sliding scale corrections, increase dose   Essential hypertension Cerebrovascular disease Hypotensive earlier in hospital stay, currently stable on midodrine  - Continue midodrine  - Hold Lasix , Isordil , hydralazine , metoprolol    HLD (hyperlipidemia) - Continue Lipitor   S/P BKA (below knee amputation) bilateral (HCC)   Class 2 obesity BMI 38, morbid obesity in the setting of comorbid diabetes, hypertension  Goals of care Discussed again with niece, patient would wish for everything done, no limitations on care, even at end of life.  This is consistent with previous Palliative care notes.    Niece however would not wish patient to suffer at EOL and if she were on life support and her doctors did not think there was a reasonable expectation that she could be extubated and return to a good quality of life, she would not wish her to suffer. - Consult Palliative Care - Continue full code for now        Subjective: Tired.  Had HD today session #1.  Cardiology re-engaged.  No fever.  Still coghing.  Too weak and out of breath to talke     Physical Exam: BP (!) 93/58   Pulse 84   Temp (!) 97.3 F (36.3 C) (Axillary)   Resp 20   Ht 5' 6 (1.676 m)   Wt 104.5 kg   SpO2 96%   BMI 37.18 kg/m   Obese adult female, lying in bed, tired, opens eyes and nods yes/no but  not consistently, gets tired and closes eyes again RRR no murmurs, heart sounds distant, JVP not visible, no edema in stumps Bilateral BKA Respiratory rate increased, lungs diminished, no rales or wheezes Abdomen without TTP Attention diminished, closes eyes, does not speak, tries but cannot get the breath, coughs then closes eyes again.  Bilaterally very weak.        Data Reviewed: Discussed with Cardiology, Nephrology BMP shows Cr up to 5.9, persistent acute metabolic acidosis CBC shows persistent anemia, worsening leukocytosis   Family Communication: Niece POA Tameka    Disposition: Status is: Inpatient 68 yo F with Advanced ischemic heart disease, EF20-25%, admitted with pneumonia sepsis.  Now with renal failrue, started hemodialysis today.    Prognosis is very guarded.  Family are aware her condition is unstable and could deteriorate at any time.  Cardiology, Nephrology and Palliative care involved.        Author: Lonni SHAUNNA Dalton, MD 11/17/2023 3:25 PM  For on call review [email or web address].

## 2023-11-17 NOTE — Progress Notes (Signed)
 EEG complete - results pending

## 2023-11-17 NOTE — Progress Notes (Addendum)
 STROKE TEAM PROGRESS NOTE    SIGNIFICANT HOSPITAL EVENTS 6/25: Patient presented with shortness of breath and hypotension, found to have sepsis from community-acquired pneumonia 6/30: Temporary hemodialysis catheter placed and patient noted to be nonverbal with increased work of breathing post procedurally, MRI demonstrates small 5 mm stroke in the right frontal lobe  INTERIM HISTORY/SUBJECTIVE Patient developed transient speech difficulties.  MRI scan shows tiny punctate right frontal white matter lacunar infarct. Patient's blood pressures have remained stable on the low end.  She has been afebrile.  She remains on BiPAP and is nonverbal and very drowsy.  OBJECTIVE  CBC    Component Value Date/Time   WBC 14.4 (H) 11/17/2023 0224   RBC 3.25 (L) 11/17/2023 0224   HGB 8.6 (L) 11/17/2023 0224   HGB 9.6 (L) 12/18/2021 1359   HCT 28.9 (L) 11/17/2023 0224   PLT 201 11/17/2023 0224   PLT 316 12/18/2021 1359   MCV 88.9 11/17/2023 0224   MCH 26.5 11/17/2023 0224   MCHC 29.8 (L) 11/17/2023 0224   RDW 19.6 (H) 11/17/2023 0224   LYMPHSABS 2.7 11/11/2023 1318   MONOABS 0.4 11/11/2023 1318   EOSABS 0.3 11/11/2023 1318   BASOSABS 0.1 11/11/2023 1318    BMET    Component Value Date/Time   NA 138 11/17/2023 0224   NA 143 08/20/2015 0000   K 4.5 11/17/2023 0224   CL 104 11/17/2023 0224   CO2 16 (L) 11/17/2023 0224   GLUCOSE 240 (H) 11/17/2023 0224   BUN 80 (H) 11/17/2023 0224   BUN 28 (A) 08/20/2015 0000   CREATININE 5.94 (H) 11/17/2023 0224   CREATININE 1.11 (H) 12/18/2021 1359   CALCIUM  7.3 (L) 11/17/2023 0224   GFRNONAA 7 (L) 11/17/2023 0224   GFRNONAA 54 (L) 12/18/2021 1359    IMAGING past 24 hours CT HEAD WO CONTRAST ( ) Result Date: 11/17/2023 CLINICAL DATA:  Mental status change, persistent or worsening EXAM: CT HEAD WITHOUT CONTRAST TECHNIQUE: Contiguous axial images were obtained from the base of the skull through the vertex without intravenous contrast. RADIATION DOSE  REDUCTION: This exam was performed according to the departmental dose-optimization program which includes automated exposure control, adjustment of the mA and/or kV according to patient size and/or use of iterative reconstruction technique. COMPARISON:  MRI head from earlier today. FINDINGS: Brain: Known acute infarct in the right frontal lobe better characterized on recent MRI. No progressive mass effect or acute hemorrhage. No midline shift. No hydrocephalus. Patchy white matter disease, compatible with chronic microvascular ischemic disease. Small remote bilateral basal ganglia lacunar infarcts. Small remote left cerebellar infarct. Cerebral atrophy. Vascular: Calcific atherosclerosis. Skull: No acute fracture. Sinuses/Orbits: Clear sinuses.  No acute orbital findings. Other: No mastoid effusions. IMPRESSION: Known small acute infarct in the right frontal lobe better characterized on recent MRI. No progressive mass effect or acute hemorrhage. Electronically Signed   By: Gilmore GORMAN Molt M.D.   On: 11/17/2023 01:33   MR BRAIN WO CONTRAST Result Date: 11/17/2023 EXAM DESCRIPTION: MR BRAIN WO CONTRAST CLINICAL HISTORY: Mental status change, unknown cause COMPARISON: None available TECHNIQUE: Non contrast MRI of the brain is performed according to our usual protocol including multiplanar multi sequence technique. FINDINGS: There is a 5 mm acute/subacute infarct in the right frontal lobe seen on diffusion image 82. No other acute intracranial hemorrhage, mass, edema, or hydrocephalus. Mild to moderate cortical atrophy. Very small chronic infarcts left cerebellum. Small bilateral chronic basal ganglia lacunar infarcts. Mild to moderate white matter disease suggesting chronic small vessel ischemic  change. The vascular flow voids are unremarkable. No significant sinus disease. IMPRESSION: 5 mm acute/subacute infarct in the right frontal lobe. Age-related change. Very small chronic infarcts in the left cerebellum.  Electronically signed by: Reyes Frees MD 11/17/2023 12:35 AM EDT RP Workstation: [id number]   DG CHEST PORT 1 VIEW Result Date: 11/16/2023 CLINICAL DATA:  Rales. EXAM: PORTABLE CHEST 1 VIEW COMPARISON:  Radiograph 11/12/2023 FINDINGS: Right-sided dialysis catheter with tip overlying the right atrium. No pneumothorax. Cardiomegaly is unchanged. Mediastinal contours are stable. Improving vascular congestion. Suspected small pleural effusions. IMPRESSION: 1. Improving vascular congestion. Suspected small pleural effusions. 2. Unchanged cardiomegaly. 3. Right dialysis catheter tip overlies the right atrium. Electronically Signed   By: Andrea Gasman M.D.   On: 11/16/2023 21:42   IR Fluoro Guide CV Line Right Result Date: 11/16/2023 CLINICAL DATA:  Renal failure and need for non tunneled dialysis catheter for immediate hemodialysis. EXAM: NON-TUNNELED CENTRAL VENOUS HEMODIALYSIS CATHETER PLACEMENT WITH ULTRASOUND AND FLUOROSCOPIC GUIDANCE FLUOROSCOPY: Radiation Exposure Index: 17.3 mGy Kerma PROCEDURE: The procedure, risks, benefits, and alternatives were explained to the patient. Questions regarding the procedure were encouraged and answered. The patient understands and consents to the procedure. A time out was performed prior to initiating the procedure. Ultrasound was used to confirm patency of the right internal jugular vein. An ultrasound image was saved and recorded. The right neck and chest were prepped with chlorhexidine  in a sterile fashion, and a sterile drape was applied covering the operative field. Maximum barrier sterile technique with sterile gowns and gloves were used for the procedure. Local anesthesia was provided with 1% lidocaine . After creating a small venotomy incision, a 21 gauge needle was advanced into the right internal jugular vein under direct, real-time ultrasound guidance. Ultrasound image documentation was performed. After securing guidewire access,the venotomy was dilated over  a guide wire. A 16 cm, 13 Fr, triple lumen non-tunneled dialysis catheter was advanced over the wire. Final catheter positioning was confirmed and documented with a fluoroscopic spot image. The catheter was aspirated, flushed with saline, and injected with appropriate volume heparin  dwells. The catheter exit site was secured with 0-Prolene retention sutures. COMPLICATIONS: None.  No pneumothorax. FINDINGS: After catheter placement, the tip lies at the cavoatrial junction. The catheter aspirates normally and is ready for immediate use. IMPRESSION: Placement of non-tunneled central venous hemodialysis catheter via the right internal jugular vein. The catheter tip lies at the cavoatrial junction. The catheter is ready for immediate use. Electronically Signed   By: Marcey Moan M.D.   On: 11/16/2023 17:03   IR US  Guide Vasc Access Right Result Date: 11/16/2023 CLINICAL DATA:  Renal failure and need for non tunneled dialysis catheter for immediate hemodialysis. EXAM: NON-TUNNELED CENTRAL VENOUS HEMODIALYSIS CATHETER PLACEMENT WITH ULTRASOUND AND FLUOROSCOPIC GUIDANCE FLUOROSCOPY: Radiation Exposure Index: 17.3 mGy Kerma PROCEDURE: The procedure, risks, benefits, and alternatives were explained to the patient. Questions regarding the procedure were encouraged and answered. The patient understands and consents to the procedure. A time out was performed prior to initiating the procedure. Ultrasound was used to confirm patency of the right internal jugular vein. An ultrasound image was saved and recorded. The right neck and chest were prepped with chlorhexidine  in a sterile fashion, and a sterile drape was applied covering the operative field. Maximum barrier sterile technique with sterile gowns and gloves were used for the procedure. Local anesthesia was provided with 1% lidocaine . After creating a small venotomy incision, a 21 gauge needle was advanced into the right internal jugular vein under  direct, real-time  ultrasound guidance. Ultrasound image documentation was performed. After securing guidewire access,the venotomy was dilated over a guide wire. A 16 cm, 13 Fr, triple lumen non-tunneled dialysis catheter was advanced over the wire. Final catheter positioning was confirmed and documented with a fluoroscopic spot image. The catheter was aspirated, flushed with saline, and injected with appropriate volume heparin  dwells. The catheter exit site was secured with 0-Prolene retention sutures. COMPLICATIONS: None.  No pneumothorax. FINDINGS: After catheter placement, the tip lies at the cavoatrial junction. The catheter aspirates normally and is ready for immediate use. IMPRESSION: Placement of non-tunneled central venous hemodialysis catheter via the right internal jugular vein. The catheter tip lies at the cavoatrial junction. The catheter is ready for immediate use. Electronically Signed   By: Marcey Moan M.D.   On: 11/16/2023 17:03    Vitals:   11/17/23 0810 11/17/23 1003 11/17/23 1010 11/17/23 1015  BP:  105/65 94/62 102/65  Pulse: 84 85 83 84  Resp: (!) 27 (!) 26 (!) 23 (!) 28  Temp:  97.7 F (36.5 C)    TempSrc:      SpO2: 100% 100% 100% 100%  Weight:  106.4 kg    Height:         PHYSICAL EXAM General: Ill-appearing patient with bilateral BKA's in no acute distress CV: Regular rate and rhythm on monitor Respiratory: Slightly labored respirations on BiPAP GI: Abdomen soft and nontender   NEURO:  Mental Status: Patient is drowsy but opens eyes to loud voice.  She is nonverbal but responds to her name and will intermittently follow commands Speech/Language: No verbal output  Cranial Nerves:  II: PERRL.  III, IV, VI: Tracks examiner around the room VII: Face appears symmetrical with BiPAP mask on VIII: hearing intact to voice. XII: Noncooperative with tongue protrusion Motor: Able to move bilateral upper extremities with antigravity strength, some asterixis noted, moves bilateral  lower extremities but does not lift them off the bed Tone: is normal and bulk is normal Sensation- Intact to light touch bilaterally.  Coordination: Unable to perform Gait- deferred  Most Recent NIH  1a Level of Conscious.: 1 1b LOC Questions: 2 1c LOC Commands: 1 2 Best Gaze: 0 3 Visual: 0 4 Facial Palsy: 0 5a Motor Arm - left: 0 5b Motor Arm - Right: 0 6a Motor Leg - Left: 3 6b Motor Leg - Right: 3 7 Limb Ataxia: 0 8 Sensory: 0 9 Best Language: 3 10 Dysarthria: 2 11 Extinct. and Inatten.: 0 TOTAL: 15   ASSESSMENT/PLAN  [name] is a 68 y.o. female with history of PAD, bilateral BKA's, hypertension, hyperlipidemia, diabetes, retinopathy, obesity, recent non-STEMI who was originally admitted for sepsis secondary to pneumonia and had an AKI on CKD as well as CHF exacerbation was found to be nonverbal with increased respiratory distress after procedure to insert temporary dialysis catheter.  She was placed on BiPAP.  MRI found small right frontal lobe stroke, which is likely not the cause of her symptoms.  She was noted to be in A-fib on admission in the setting of sepsis.  Of note, she has had a 7-day course of cefepime , and this medication can cause altered mental status.  NIH on Admission 5  Acute Ischemic Infarct:  right frontal lobe stroke Etiology: Small vessel disease versus embolic given transient A-fib noted on EKG on 11/11/2023 CT head 7/1 known small acute infarct in right frontal lobe, no progressive mass effect or acute hemorrhage MRI 5 mm acute/subacute  infarct in right frontal lobe TCD pending Carotid Doppler pending 2D Echo EF 20 to 25%, grade 2 diastolic dysfunction,, mild LVH, left atrium mildly dilated, cannot exclude small PFO LDL 61 HgbA1c 7.0 VTE prophylaxis -subcutaneous heparin  aspirin  81 mg daily prior to admission, now on aspirin  300 mg suppository daily and clopidogrel  75 mg daily for 3 weeks and then Plavix  alone. Therapy recommendations:   Pending Disposition: Pending   Atrial fibrillation Patient had an episode of A-fib on admission in the setting of sepsis, resolved spontaneously and no need for anticoagulation  Hypertension Home meds: Hydralazine  25 mg daily Stable on the low end Blood Pressure Goal: BP less than 220/110   Hyperlipidemia Home meds: Atorvastatin  80 mg daily, resumed in hospital LDL 61, goal < 70 Continue statin at discharge  Diabetes type II Controlled Home meds: Insulin  lispro sliding scale, metformin  500 mg daily HgbA1c 7.0, goal < 7.0 CBGs SSI Recommend close follow-up with PCP for better DM control  Community-acquired pneumonia, sepsis and respiratory failure Patient developed increased work of breathing after procedure yesterday Continues on BiPAP 7-day course of cefepime  completed  AKI on CKD Patient with CKD stage IIIa developed AKI in the setting of vancomycin , contrast and diuretic use Temporary dialysis catheter placed 6/30 Hemodialysis session today  CHF exacerbation EF 20 to 25% Diuretic stopped in setting of renal failure Management per primary team  Dysphagia Patient has post-stroke dysphagia, SLP consulted    Diet   Diet NPO time specified   Advance diet as tolerated  Other Stroke Risk Factors Obesity, Body mass index is 37.86 kg/m., BMI >/= 30 associated with increased stroke risk, recommend weight loss, diet and exercise as appropriate  Coronary artery disease Congestive heart failure   Other Active Problems None  Hospital day # 6  Patient seen by NP with MD, MD to edit note as needed. Cortney E Everitt Clint Kill , MSN, AGACNP-BC [facility] See Amion for schedule and pager information 11/17/2023 11:07 AM  I have personally obtained history,examined this patient, reviewed notes, independently viewed imaging studies, participated in medical decision making and plan of care.ROS completed by me personally and pertinent positives fully documented  I  have made any additions or clarifications directly to the above note. Agree with note above.  Patient presented with sepsis, congestive heart failure and acute renal failure and transient A-fib on admission MRI scan just shows small punctate right frontal white matter infarct which is likely lacunar in perhaps not related to her A-fib which was transient hence would not recommend long-term anticoagulation.  Recommend aspirin  and Plavix  for 3 weeks followed by Plavix  alone.  Aggressive risk factor modification.  Mobilize out of bed.  Therapy consults.  No family available at the bedside for discussion.  Discussed with Dr. Jonel.   I personally spent a total of 50 minutes in the care of the patient today including getting/reviewing separately obtained history, performing a medically appropriate exam/evaluation, counseling and educating, placing orders, referring and communicating with other health care professionals, documenting clinical information in the EHR, independently interpreting results, and coordinating care.         Eather Popp, MD Medical Director [facility] Pager: [phone] 11/17/2023 3:10 PM   To contact Stroke Continuity provider, please refer to [email or web address]. After hours, contact General Neurology

## 2023-11-17 NOTE — Progress Notes (Addendum)
 The patient was unable to tolerate brain MRI study.  Stat noncontrast head CT ordered to rule out any acute intracranial abnormalities.  ABG also ordered for increased work of breathing and hypoxemia.  No charge note.

## 2023-11-17 NOTE — Progress Notes (Signed)
 Patient was transported from 3E to dialysis on BIPAP without any complications. Dialysis RT was made aware.

## 2023-11-17 NOTE — Progress Notes (Signed)
 OT Cancellation Note  Patient Details Name: [name] MRN: [id number] DOB: 02-20-1956   Cancelled Treatment:    Reason Eval/Treat Not Completed: Patient at procedure or test/ unavailable (HD. Will follow up at a later time.)  [facility] 11/17/2023, 9:58 AM Kreg Sink, OT/L   Acute OT Clinical Specialist Acute Rehabilitation Services Pager [phone] Office [phone]

## 2023-11-17 NOTE — Progress Notes (Addendum)
 [facility], hospitalist service, overnight cross coverage  The patient with sudden inability to speak.  Unclear of last known well.  Per family member they last spoke with her around 7 PM on 11/15/2023.  MRI brain revealed 5 mm acute/subacute infarct in the right frontal lobe.  Neurology/stroke team consulted.  Addendum:  To note, stat CT head was obtained after being informed by bedside RN Dionisio unable to complete MRI due to WOB.

## 2023-11-17 NOTE — Progress Notes (Signed)
   11/17/23 1320  Oxygen Therapy/Pulse Ox  O2 Device (S)  Nasal Cannula  O2 Therapy Oxygen  O2 Flow Rate (L/min) 3 L/min  FiO2 (%) 32 %  SpO2 100 %   RT x2 transition pt. From bipap to nasal canula and is tolerating well at this time. Bipap at bedside

## 2023-11-17 NOTE — Consult Note (Addendum)
 NEUROLOGY CONSULT NOTE   Date of service: November 17, 2023 Patient Name: [name] MRN:  [id number] DOB:  [birth year] Chief Complaint: stroke on MRI Brain Requesting Provider: Jonel Lonni SQUIBB, *  History of Present Illness  [name] is a 68 y.o. female with hx of PAD, BKA HTN, HLD, DM2 and retinopathy, obesity who is admitted with severe sepsis secondary to pneumonia and ARF on CKD 3a and acute on chronic CHF.  Patient underwent temp HD cath placement in the evening yesterday. Post procedure, she was noted to be non verbal but also significant work of breathing, tachypneic and using accessory muscles. A MRI Brain was obtained and showed a small 5mm R frontal stroke. LKW is unclear.  Neurology consulted for further evaluation and workup.  On my evaluation, she looks exhausted, worn out. She is significantly tachypneic and on Bipap.  She can follow commands but has limited speech output and I believe this is likely secondary to breathing about 38 times a minute.  LKW: unclear Modified rankin score: 4-Needs assistance to walk and tend to bodily needs IV Thrombolysis: not offered, no clear LKW and stroke unlikely to cause symptoms. EVT: not offered, low suspicion for LVO.  NIHSS components Score: Comment  1a Level of Conscious 0[]  1[x]  2[]  3[]      1b LOC Questions 0[]  1[]  2[x]       1c LOC Commands 0[x]  1[]  2[]       2 Best Gaze 0[x]  1[]  2[]       3 Visual 0[x]  1[]  2[]  3[]      4 Facial Palsy 0[x]  1[]  2[]  3[]    No obvious facial droop, difficult to evaluate with Bipap on.  5a Motor Arm - left 0[x]  1[]  2[]  3[]  4[]  UN[]    5b Motor Arm - Right 0[x]  1[]  2[]  3[]  4[]  UN[]    6a Motor Leg - Left 0[]  1[]  2[]  3[]  4[]  UN[x]  BL below Knee amputation  6b Motor Leg - Right 0[]  1[]  2[]  3[]  4[]  UN[x]    7 Limb Ataxia 0[x]  1[]  2[]  UN[]      8 Sensory 0[x]  1[]  2[]  UN[]      9 Best Language 0[]  1[]  2[x]  3[]      10 Dysarthria 0[x]  1[]  2[]  UN[]      11 Extinct. and Inattention 0[x]  1[]  2[]        TOTAL: 5      ROS  Unable to ascertain due to no speech output secondary to increased WOB.  Past History   Past Medical History:  Diagnosis Date   Atypical chest pain    a. non obstructive by cath in 2008 and 2010;  b. 02/2012 Myoview : non-ischemic, EF 57%   Cellulitis    a. left foot-> s/p L BKA   Chronic kidney disease    Constipation    CVA (cerebral infarction)    a.  Small right parietal noted incidentally 04/2007;  b. right sided embolic CVA 05/2012;  c. TEE 2/14:  LVH, EF 55-60%, mild LAE, no LAA clot, no PFO, no R->L shunt by echo contrast, oscillating density on AV likely Lambl's Excressence    Diabetes mellitus, type II (HCC)    Diabetic retinopathy (HCC)    NPDR w/edema OU   Diaphragmatic hernia without mention of obstruction or gangrene    Dry skin    Esophageal reflux    HTN (hypertension)    Hx of BKA (HCC)    Bilateral   Hyperlipidemia    Irritable bowel syndrome    Morbid obesity (HCC)  Obesity, unspecified    Pain in shoulder 06/28/2015   Peripheral neuropathy    Stroke [facility])    2013-'no residual'   Syncope and collapse    a. near-syncopal episode in November 2008;  b. 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   Tobacco abuse    Vertigo     Past Surgical History:  Procedure Laterality Date   AMPUTATION  12/30/2011   Procedure: AMPUTATION RAY;  Surgeon: Norleen Armor, MD;  Location: [facility] OR;  Service: Orthopedics;  Laterality: Left;  LEFT FIRST RAY AMPUTATION   AMPUTATION  01/13/2012   Procedure: AMPUTATION BELOW KNEE;  Surgeon: Norleen Armor, MD;  Location: [facility] OR;  Service: Orthopedics;  Laterality: Left;   AMPUTATION  03/04/2012   Procedure: AMPUTATION BELOW KNEE;  Surgeon: Norleen Armor, MD;  Location: [facility] OR;  Service: Orthopedics;  Laterality: Left;  Revision of Left Below Knee Amputation   AMPUTATION Right 11/02/2013   Procedure: AMPUTATION BELOW KNEE;  Surgeon: Jerona Harden GAILS, MD;  Location: [facility] OR;  Service: Orthopedics;  Laterality: Right;  Right Below Knee  Amputation   BIOPSY  03/29/2019   Procedure: BIOPSY;  Surgeon: San Sandor GAILS, DO;  Location: [facility] ENDOSCOPY;  Service: Gastroenterology;;  EGD and Colon   CARDIAC CATHETERIZATION     LAD 30%, circumflex 50%, OM 75%, RI 60% with small branch 80%, dominant RCA 60%, EF 45-50%   CHOLECYSTECTOMY     COLONOSCOPY     COLONOSCOPY WITH PROPOFOL  N/A 03/29/2019   Procedure: COLONOSCOPY WITH PROPOFOL ;  Surgeon: San Sandor GAILS, DO;  Location: [facility] ENDOSCOPY;  Service: Gastroenterology;  Laterality: N/A;   ESOPHAGOGASTRODUODENOSCOPY (EGD) WITH PROPOFOL  N/A 03/29/2019   Procedure: ESOPHAGOGASTRODUODENOSCOPY (EGD) WITH PROPOFOL ;  Surgeon: San Sandor GAILS, DO;  Location: [facility] ENDOSCOPY;  Service: Gastroenterology;  Laterality: N/A;   EYE SURGERY Right    cataract   I & D EXTREMITY  12/23/2011   Procedure: IRRIGATION AND DEBRIDEMENT EXTREMITY;  Surgeon: Elspeth JONELLE Her, MD;  Location: [facility]  OR;  Service: Orthopedics;  Laterality: Left;  Left Foot   I & D EXTREMITY  12/26/2011   Procedure: IRRIGATION AND DEBRIDEMENT EXTREMITY;  Surgeon: Norleen Armor, MD;  Location: [facility] OR;  Service: Orthopedics;  Laterality: Left;  LEFT FOOT I&D WITH POSSIBLE WOUND VAC APPLICATION, POSSIBLE LEFT FIRST RAY AMPUTATION   I & D EXTREMITY Right 10/29/2013   Procedure: IRRIGATION AND DEBRIDEMENT EXTREMITY;  Surgeon: Reyes JAYSON Billing, MD;  Location: [facility] OR;  Service: Orthopedics;  Laterality: Right;   Invasive Electrophysiologic Study  5/09   followed by insertion of an implantable loop recorder. S/p removal    IR FLUORO GUIDE CV LINE RIGHT  11/16/2023   IR US  GUIDE VASC ACCESS RIGHT  11/16/2023   Multiple Toe Surgeries     POLYPECTOMY  03/29/2019   Procedure: POLYPECTOMY;  Surgeon: San Sandor GAILS, DO;  Location: [facility] ENDOSCOPY;  Service: Gastroenterology;;   RIGHT/LEFT HEART CATH AND CORONARY ANGIOGRAPHY N/A 09/07/2023   Procedure: RIGHT/LEFT HEART CATH AND CORONARY ANGIOGRAPHY;  Surgeon: Anner Alm ORN, MD;  Location: [facility] INVASIVE CV  LAB;  Service: Cardiovascular;  Laterality: N/A;   TEE WITHOUT CARDIOVERSION N/A 07/07/2012   Procedure: TRANSESOPHAGEAL ECHOCARDIOGRAM (TEE);  Surgeon: Redell GORMAN Shallow, MD;  Location: [facility] END[facility]COPY;  Service: Cardiovascular;  Laterality: N/A;    Family History: Family History  Problem Relation Age of Onset   Pneumonia Mother    Gallbladder disease Mother        cancer   Heart failure Mother    Diabetes Father  Coronary artery disease Father    Stroke Neg Hx     Social History  reports that she has quit smoking. Her smoking use included cigarettes. She has a 10 pack-year smoking history. She has never used smokeless tobacco. She reports current alcohol use of about 4.0 standard drinks of alcohol per week. She reports that she does not use drugs.  Allergies  Allergen Reactions   Banana Swelling and Other (See Comments)    Facial swelling   Penicillins Anaphylaxis, Itching and Rash   Strawberry Extract Swelling    Facial swelling    Medications   Current Facility-Administered Medications:    [START ON 11/18/2023]  stroke: early stages of recovery book, , Does not apply, Once, Hall, Carole N, DO   acetaminophen  (TYLENOL ) tablet 650 mg, 650 mg, Oral, Q6H PRN, 650 mg at 11/15/23 0838 **OR** acetaminophen  (TYLENOL ) suppository 650 mg, 650 mg, Rectal, Q6H PRN, Tobie Mario GAILS, MD   albuterol  (PROVENTIL ) (2.5 MG/3ML) 0.083% nebulizer solution 2.5 mg, 2.5 mg, Nebulization, TID, Danford, Lonni SQUIBB, MD, 2.5 mg at 11/16/23 2011   aspirin  suppository 300 mg, 300 mg, Rectal, Once, Hall, Carole N, DO   atorvastatin  (LIPITOR ) tablet 80 mg, 80 mg, Oral, Daily, Tobie Mario V, MD, 80 mg at 11/16/23 0935   ceFEPIme  (MAXIPIME ) 2 g in sodium chloride  0.9 % 100 mL IVPB, 2 g, Intravenous, Q24H, Bitonti, Michael T, RPH, Last Rate: 200 mL/hr at 11/17/23 0331, 2 g at 11/17/23 0331   Chlorhexidine  Gluconate Cloth 2 % PADS 6 each, 6 each, Topical, Q0600, Melia Lynwood ORN, MD, 6 each at 11/17/23 0545    clopidogrel  (PLAVIX ) tablet 75 mg, 75 mg, Oral, Daily, Tobie Mario V, MD, 75 mg at 11/16/23 0935   heparin  injection 5,000 Units, 5,000 Units, Subcutaneous, Q8H, Danford, Lonni SQUIBB, MD, 5,000 Units at 11/17/23 0545   HYDROmorphone  (DILAUDID ) injection 0.5 mg, 0.5 mg, Intravenous, Q4H PRN, Jonel Lonni SQUIBB, MD, 0.5 mg at 11/16/23 0505   insulin  aspart (novoLOG ) injection 0-5 Units, 0-5 Units, Subcutaneous, QHS, Hall, Carole N, DO   insulin  aspart (novoLOG ) injection 0-9 Units, 0-9 Units, Subcutaneous, TID WC, Tobie Mario GAILS, MD, 7 Units at 11/16/23 1754   insulin  glargine-yfgn (SEMGLEE ) injection 6 Units, 6 Units, Subcutaneous, Daily, Danford, Lonni SQUIBB, MD, 6 Units at 11/16/23 0935   ipratropium-albuterol  (DUONEB) 0.5-2.5 (3) MG/3ML nebulizer solution 3 mL, 3 mL, Nebulization, Q2H PRN, Danford, Lonni SQUIBB, MD, 3 mL at 11/16/23 2148   ipratropium-albuterol  (DUONEB) 0.5-2.5 (3) MG/3ML nebulizer solution 3 mL, 3 mL, Nebulization, STAT, Hall, Carole N, DO   midodrine  (PROAMATINE ) tablet 2.5 mg, 2.5 mg, Oral, TID WC, Danford, Lonni SQUIBB, MD, 2.5 mg at 11/16/23 1753   ondansetron  (ZOFRAN ) tablet 4 mg, 4 mg, Oral, Q6H PRN **OR** ondansetron  (ZOFRAN ) injection 4 mg, 4 mg, Intravenous, Q6H PRN, Tobie Mario GAILS, MD   pantoprazole  (PROTONIX ) EC tablet 40 mg, 40 mg, Oral, Daily, Tobie Mario V, MD, 40 mg at 11/16/23 0935   protein supplement (ENSURE MAX) liquid, 11 oz, Oral, BID, Danford, Lonni SQUIBB, MD, 11 oz at 11/16/23 0940   sodium chloride  flush (NS) 0.9 % injection 3 mL, 3 mL, Intravenous, Q12H, Tobie Mario V, MD, 3 mL at 11/16/23 2135   thiamine  (VITAMIN B1) tablet 100 mg, 100 mg, Oral, Daily, Tobie Mario V, MD, 100 mg at 11/16/23 0935  Vitals   Vitals:   11/16/23 2100 11/16/23 2257 11/16/23 2300 11/17/23 0503  BP:   120/69 96/78  Pulse:  91 89  94  Resp: (!) 46 (!) 32 (!) 28 (!) 35  Temp:    (!) 97.5 F (36.4 C)  TempSrc:    Axillary  SpO2: 90% 100% 100% 98%  Weight:    107 kg   Height:        Body mass index is 38.07 kg/m.   Physical Exam   General: reclined in the bed, on bipap, tachypneic, looks exhausted.  HENT: Normal oropharynx and mucosa. Normal external appearance of ears and nose. Neck: Supple, no pain or tenderness  CV: No JVD. No peripheral edema.  Pulmonary: Symmetric Chest rise. tachypneic Abdomen: Soft to touch, non-tender, round. Ext: No cyanosis, edema, BL BKA. Skin: No rash. Normal palpation of skin. Musculoskeletal: Normal digits and nails by inspection. No clubbing.  Neurologic Examination  Mental status/Cognition: Alert, does not answer any orientation questions. Speech/language: mute, comprehension intact. Cranial nerves:   CN II Pupils equal and reactive to light, no VF deficits   CN III,IV,VI EOM intact, no gaze preference or deviation, no nystagmus   CN V normal sensation in V1, V2, and V3 segments bilaterally   CN VII no asymmetry, no nasolabial fold flattening, difficult to assess thou with Bipap.   CN VIII Makes eye contact to speech and follows commands.   CN IX & X Protecting her airway   CN XI Head midline.   CN XII midline tongue protrusion   Motor:  Muscle bulk: poor, tone normal. Unable to do detailed strength testing secondary to exhaustion/tachypnea. BL hand grip is about 4 out of 5. Holds BL upper extremities off the bed for more than 10 secs without any drift.  Has BL below knee amputation.  Sensation:  Light touch Intact throughout   Pin prick    Temperature    Vibration   Proprioception    Coordination/Complex Motor:  - Finger to Nose intact BL   Labs/Imaging/Neurodiagnostic studies   CBC:  Recent Labs  Lab 12/07/2023 1318 12/07/23 2031 11/16/23 0307 11/17/23 0224  WBC 24.4*   < > 16.4* 14.4*  NEUTROABS 20.6*  --   --   --   HGB 12.3   < > 9.7* 8.6*  HCT 40.8   < > 32.9* 28.9*  MCV 86.4   < > 86.6 88.9  PLT 327   < > 220 201   < > = values in this interval not displayed.   Basic  Metabolic Panel:  Lab Results  Component Value Date   NA 138 11/17/2023   K 4.5 11/17/2023   CO2 16 (L) 11/17/2023   GLUCOSE 240 (H) 11/17/2023   BUN 80 (H) 11/17/2023   CREATININE 5.94 (H) 11/17/2023   CALCIUM  7.3 (L) 11/17/2023   GFRNONAA 7 (L) 11/17/2023   GFRAA 52 (L) 04/05/2019   Lipid Panel:  Lab Results  Component Value Date   LDLCALC 61 09/07/2023   HgbA1c:  Lab Results  Component Value Date   HGBA1C 7.3 (H) 09/07/2023   Urine Drug Screen:     Component Value Date/Time   LABOPIA NONE DETECTED 06/25/2012 0351   COCAINSCRNUR NONE DETECTED 06/25/2012 0351   LABBENZ NONE DETECTED 06/25/2012 0351   AMPHETMU NONE DETECTED 06/25/2012 0351   THCU NONE DETECTED 06/25/2012 0351   LABBARB NONE DETECTED 06/25/2012 0351    Alcohol Level     Component Value Date/Time   [facility] <15 12/07/2023 2028   INR  Lab Results  Component Value Date   INR 1.00 04/20/2013   APTT  Lab Results  Component Value Date   APTT 22 (L) 04/20/2013   AED levels: No results found for: PHENYTOIN, ZONISAMIDE, LAMOTRIGINE, LEVETIRACETA  CT Head without contrast(Personally reviewed): Known small acute infarct in the right frontal lobe better characterized on recent MRI. No progressive mass effect or acute hemorrhage.  MR Angio head without contrast and Carotid Duplex BL(Personally reviewed): pending  MRI Brain(Personally reviewed): 5 mm acute/subacute infarct in the right frontal lobe.    ASSESSMENT   [name] is a 68 y.o. female with hx of PAD, BKA HTN, HLD, DM2 and retinopathy, obesity who is admitted with severe sepsis secondary to pneumonia and ARF on CKD 3a and acute on chronic CHF.  Patient underwent temp HD cath placement in the evening yesterday. Post procedure, she was noted to be non verbal but also significant work of breathing, tachypneic and using accessory muscles. A MRI Brain was obtained and showed a small 5mm R frontal stroke. LKW is unclear.  Neurology  consulted for further evaluation and workup.  I do not think that the stroke explains her poor speech output. Stroke is likely an incidental finding. I do not think that patient has true aphasia. I think the likely reason for her not being able to talk is significant tachypnea. Any amount of speech output requires that we can pull enough air into our lungs. the location of this stroke is unlikely to cause any clinical symptoms.  I suspect that the noted stroke was likely secondary to transient afibb noted on presenting EKG. Reviewed cardiology notes and afibb believed to be transient secondary to pulmonary/infectious process.  RECOMMENDATIONS  - Frequent Neuro checks per stroke unit protocol - Recommend Vascular imaging with MRA Angio Head without contrast and US  Carotid doppler - Recommend obtaining Lipid panel with LDL - Please start statin if LDL > 70 - waiting on full stroke workup before considering DOAC. Continue Aspirin  and plavix  for now. - SBP goal - aim for normotension. - Recommend Telemetry monitoring for arrythmia - Recommend bedside swallow screen prior to PO intake. - Stroke education booklet - Recommend PT/OT/SLP consult - Recommend Urine Tox screen. - defer workup and management of significant tachypnea to primary team. This was discussed with Dr. Shona with the hospitalist team overnight. ______________________________________________________________________  Plan discussed with Dr. Shona with the hospitalist team overnight.  Signed, Rim Thatch, MD [facility]

## 2023-11-17 NOTE — Progress Notes (Signed)
 DR LIN NOTIFIED PT ON BIPAP STABLE TO COME TO HD UNIT

## 2023-11-17 NOTE — Progress Notes (Signed)
 [name] is an 68 y.o. female w/ DM with retinopathy and peripheral neuropathy, HTN, PAD with B/L AKA, HLD, CVA, CKD 3A as below who presented to ED sent from [facility] due to SOB 11/11/2023.  O2 sats were in the 70s, heart rate 130s.  Patient was initially hypotensive.  Recently was admitted in April for NSTEMI.  Admitted for severe sepsis due to CAP. Also felt to have acute on chronic syst/ diast CHF (EF25%). Creat bumped up to 2.7. Then on 4th hospital day IV vanc was stopped, IV cefepime  continued.  Renal US  was negative, IVFs were cont'd and midodrine . Not a CABG candidate.   Assessment/Plan: AKI on CKD 3b: b/l creat is 1.2- 1.7 from April - June 2025, eGFR 32- 48 ml/min. Creat here was 1.5 on admit 6/25. SP IV contrast on 6/25 w/ CTA chest. Pt was hypotensive on 6/26 and still had soft BP's on 6/27, but now is better.  UOP has been marginal (200-300 cc/d). UA shows wbc's. Urine lytes c/w ATN. Renal US  shows no obstruction. AKI likely ATN d/t contrast + hypotension. Making very low amts of urine. Maintain supportive care.  Minimal UOP; renal function and metabolic acidosis continue to worsen   I discussed with niece [name] (on 6/30) who has power of attorney; appreciate VIR RIJ temp cath (placed 6/30)  Planning on HD this AM; continues to be confused, poor urine output, volume overloaded, respiratory rate 22 on pressure support 40% of FiO2  Volume: vol excess on exam, very urine output is likely significantly volume overloaded.  Respiratory rate was also high requiring BiPAP overnight Debility: not sure what her capacity is DM2: per pmd HTN: bp's soft, getting midodrine  at 2.5 tid. Continue.  CAP: on Cefepime   Subjective: Confused, not really answering questions appropriately on pressure support FiO2 40% respiratory rate 22. BiPAP overnight.   Chemistry and CBC: Creatinine  Date/Time Value Ref Range Status  12/18/2021 01:59 PM 1.11 (H) 0.44 - 1.00 mg/dL Final  98/69/7976  97:67 PM 1.16 0.60 - 1.20 mg/dL Final  92/70/7977 89:78 AM 1.31 (H) 0.44 - 1.00 mg/dL Final  88/82/7979 89:42 AM 1.27 (H) 0.44 - 1.00 mg/dL Final  95/96/7982 87:99 AM 1.2 (A) 0.5 - 1.1 mg/dL Final  97/84/7982 87:99 AM 1.0 0.5 - 1.1 mg/dL Final  91/87/7983 87:99 AM 0.9 0.5 - 1.1 mg/dL Final   Creatinine, Ser  Date/Time Value Ref Range Status  11/17/2023 02:24 AM 5.94 (H) 0.44 - 1.00 mg/dL Final  93/69/7974 96:92 AM 4.94 (H) 0.44 - 1.00 mg/dL Final  93/70/7974 95:67 AM 4.38 (H) 0.44 - 1.00 mg/dL Final  93/71/7974 96:87 AM 3.88 (H) 0.44 - 1.00 mg/dL Final  93/72/7974 88:51 AM 3.20 (H) 0.44 - 1.00 mg/dL Final  93/72/7974 96:97 AM 2.76 (H) 0.44 - 1.00 mg/dL Final    Comment:    DELTA CHECK NOTED  11/12/2023 05:00 AM 1.73 (H) 0.44 - 1.00 mg/dL Final  93/74/7974 98:81 PM 1.51 (H) 0.44 - 1.00 mg/dL Final  95/69/7974 94:99 AM 1.94 (H) 0.44 - 1.00 mg/dL Final  95/70/7974 90:99 AM 2.10 (H) 0.44 - 1.00 mg/dL Final  95/71/7974 94:99 AM 2.29 (H) 0.44 - 1.00 mg/dL Final  95/72/7974 95:69 AM 2.21 (H) 0.44 - 1.00 mg/dL Final  95/73/7974 96:40 AM 2.62 (H) 0.44 - 1.00 mg/dL Final  95/74/7974 96:82 AM 2.70 (H) 0.44 - 1.00 mg/dL Final  95/75/7974 95:84 AM 2.52 (H) 0.44 - 1.00 mg/dL Final  95/76/7974 95:61 AM 2.25 (H) 0.44 - 1.00  mg/dL Final  95/77/7974 94:59 PM 2.14 (H) 0.44 - 1.00 mg/dL Final  95/77/7974 96:74 AM 1.76 (H) 0.44 - 1.00 mg/dL Final  95/78/7974 97:87 AM 1.50 (H) 0.44 - 1.00 mg/dL Final  95/79/7974 97:57 PM 1.23 (H) 0.44 - 1.00 mg/dL Final  98/69/7979 98:43 PM 1.04 (H) 0.44 - 1.00 mg/dL Final  87/73/7980 88:85 AM 1.14 (H) 0.44 - 1.00 mg/dL Final  91/85/7983 93:54 PM 0.88 0.44 - 1.00 mg/dL Final  93/80/7984 92:66 AM 0.62 0.50 - 1.10 mg/dL Final  93/81/7984 90:89 AM 0.58 0.50 - 1.10 mg/dL Final  93/82/7984 94:64 AM 0.62 0.50 - 1.10 mg/dL Final  93/83/7984 93:74 AM 0.66 0.50 - 1.10 mg/dL Final  93/84/7984 93:69 AM 0.69 0.50 - 1.10 mg/dL Final  93/85/7984 93:94 AM 0.80 0.50 - 1.10 mg/dL  Final  93/87/7984 90:67 PM 0.72 0.50 - 1.10 mg/dL Final  94/77/7984 97:83 PM 0.95 0.50 - 1.10 mg/dL Final  94/84/7984 94:54 PM 1.03 0.50 - 1.10 mg/dL Final  87/96/7985 87:54 AM 0.71 0.50 - 1.10 mg/dL Final  90/95/7985 94:53 AM 0.75 0.50 - 1.10 mg/dL Final  91/70/7985 94:89 AM 0.83 0.50 - 1.10 mg/dL Final  91/71/7985 93:92 PM 0.73 0.50 - 1.10 mg/dL Final  91/72/7985 94:44 AM 0.71 0.50 - 1.10 mg/dL Final  91/74/7985 90:57 AM 0.61 0.50 - 1.10 mg/dL Final  91/75/7985 89:77 PM 0.67 0.50 - 1.10 mg/dL Final  95/77/7985 98:67 PM 0.83 0.50 - 1.10 mg/dL Final  97/88/7985 97:41 PM 0.9 0.4 - 1.2 mg/dL Final  97/93/7985 94:57 PM 0.67 0.50 - 1.10 mg/dL Final  97/93/7985 98:68 PM 1.00 0.50 - 1.10 mg/dL Final  97/93/7985 98:73 PM 0.62 0.50 - 1.10 mg/dL Final  98/79/7985 92:45 AM 0.82 0.50 - 1.10 mg/dL Final  98/91/7985 87:87 PM 0.70 0.50 - 1.10 mg/dL Final  89/83/7986 87:51 PM 0.65 0.50 - 1.10 mg/dL Final  90/74/7986 89:54 AM 0.87 0.50 - 1.10 mg/dL Final  90/76/7986 90:84 AM 0.96 0.50 - 1.10 mg/dL Final  90/84/7986 90:85 PM 0.99 0.50 - 1.10 mg/dL Final  90/97/7986 94:99 AM 0.89 0.50 - 1.10 mg/dL Final  91/69/7986 93:77 AM 1.00 0.50 - 1.10 mg/dL Final   Recent Labs  Lab 11/11/23 1318 11/11/23 2031 11/12/23 0500 11/13/23 0302 11/13/23 1148 11/14/23 0312 11/15/23 0432 11/16/23 0307 11/17/23 0224  NA 139 139 138 137  --  135 135 136 138  K 3.7 3.7 3.8 3.9  --  4.2 4.3 4.5 4.5  CL 103  --  106 103  --  102 104 105 104  CO2 18*  --  20* 16*  --  17* 13* 13* 16*  GLUCOSE 308*  --  138* 195*  --  199* 225* 205* 240*  BUN 25*  --  30* 41*  --  49* 62* 70* 80*  CREATININE 1.51*  --  1.73* 2.76* 3.20* 3.88* 4.38* 4.94* 5.94*  CALCIUM  8.5*  --  7.7* 7.7*  --  7.3* 7.3* 7.6* 7.3*  PHOS  --   --   --   --   --   --   --  5.8* 6.4*   Recent Labs  Lab 11/11/23 1318 11/11/23 2031 11/14/23 0312 11/15/23 1527 11/16/23 0307 11/17/23 0224  WBC 24.4*   < > 14.3* 13.3* 16.4* 14.4*  NEUTROABS 20.6*   --   --   --   --   --   HGB 12.3   < > 8.5* 9.8* 9.7* 8.6*  HCT 40.8   < > 28.4* 31.9*  32.9* 28.9*  MCV 86.4   < > 86.1 84.6 86.6 88.9  PLT 327   < > 217 218 220 201   < > = values in this interval not displayed.   Liver Function Tests: Recent Labs  Lab 11/12/23 0500 11/13/23 0302 11/16/23 0307 11/17/23 0224  AST 39 32  --   --   ALT 16 21  --   --   ALKPHOS 103 100  --   --   BILITOT 0.6 0.8  --   --   PROT 6.4* 6.1*  --   --   ALBUMIN  2.6* 2.4* 2.5* 2.3*   No results for input(s): LIPASE, AMYLASE in the last 168 hours. No results for input(s): AMMONIA in the last 168 hours. Cardiac Enzymes: No results for input(s): CKTOTAL, CKMB, CKMBINDEX, TROPONINI in the last 168 hours. Iron  Studies:  Recent Labs    11/15/23 0432  IRON  54  TIBC 192*  FERRITIN 1,093*   PT/INR: @LABRCNTIP (inr:5)  Xrays/Other Studies: ) Results for orders placed or performed during the hospital encounter of 11/11/23 (from the past 48 hours)  Glucose, capillary     Status: Abnormal   Collection Time: 11/15/23 11:41 AM  Result Value Ref Range   Glucose-Capillary 230 (H) 70 - 99 mg/dL    Comment: Glucose reference range applies only to samples taken after fasting for at least 8 hours.  Glucose, capillary     Status: Abnormal   Collection Time: 11/15/23  3:10 PM  Result Value Ref Range   Glucose-Capillary 205 (H) 70 - 99 mg/dL    Comment: Glucose reference range applies only to samples taken after fasting for at least 8 hours.  CBC     Status: Abnormal   Collection Time: 11/15/23  3:27 PM  Result Value Ref Range   WBC 13.3 (H) 4.0 - 10.5 K/uL   RBC 3.77 (L) 3.87 - 5.11 MIL/uL   Hemoglobin 9.8 (L) 12.0 - 15.0 g/dL   HCT 68.0 (L) 63.9 - 53.9 %   MCV 84.6 80.0 - 100.0 fL   MCH 26.0 26.0 - 34.0 pg   MCHC 30.7 30.0 - 36.0 g/dL   RDW 80.3 (H) 88.4 - 84.4 %   Platelets 218 150 - 400 K/uL   nRBC 0.7 (H) 0.0 - 0.2 %    Comment: Performed at [facility], [address]  [place], [place] [address]  Glucose, capillary     Status: Abnormal   Collection Time: 11/15/23 11:45 PM  Result Value Ref Range   Glucose-Capillary 205 (H) 70 - 99 mg/dL    Comment: Glucose reference range applies only to samples taken after fasting for at least 8 hours.  CBC     Status: Abnormal   Collection Time: 11/16/23  3:07 AM  Result Value Ref Range   WBC 16.4 (H) 4.0 - 10.5 K/uL   RBC 3.80 (L) 3.87 - 5.11 MIL/uL   Hemoglobin 9.7 (L) 12.0 - 15.0 g/dL   HCT 67.0 (L) 63.9 - 53.9 %   MCV 86.6 80.0 - 100.0 fL   MCH 25.5 (L) 26.0 - 34.0 pg   MCHC 29.5 (L) 30.0 - 36.0 g/dL   RDW 80.3 (H) 88.4 - 84.4 %   Platelets 220 150 - 400 K/uL   nRBC 1.0 (H) 0.0 - 0.2 %    Comment: Performed at [facility], [address], [place], [place] [address]  Renal function panel     Status: Abnormal  Collection Time: 11/16/23  3:07 AM  Result Value Ref Range   Sodium 136 135 - 145 mmol/L   Potassium 4.5 3.5 - 5.1 mmol/L   Chloride 105 98 - 111 mmol/L   CO2 13 (L) 22 - 32 mmol/L   Glucose, Bld 205 (H) 70 - 99 mg/dL    Comment: Glucose reference range applies only to samples taken after fasting for at least 8 hours.   BUN 70 (H) 8 - 23 mg/dL   Creatinine, Ser 5.05 (H) 0.44 - 1.00 mg/dL   Calcium  7.6 (L) 8.9 - 10.3 mg/dL   Phosphorus 5.8 (H) 2.5 - 4.6 mg/dL   Albumin  2.5 (L) 3.5 - 5.0 g/dL   GFR, Estimated 9 (L) >60 mL/min    Comment: (NOTE) Calculated using the CKD-EPI Creatinine Equation (2021)    Anion gap 18 (H) 5 - 15    Comment: Performed at [facility], [address], [place], [place] [address]  Glucose, capillary     Status: Abnormal   Collection Time: 11/16/23  8:01 AM  Result Value Ref Range   Glucose-Capillary 198 (H) 70 - 99 mg/dL    Comment: Glucose reference range applies only to samples taken after fasting for at least 8 hours.  Glucose, capillary     Status: Abnormal   Collection Time: 11/16/23 11:19 AM  Result Value Ref Range   Glucose-Capillary 251 (H) 70 - 99  mg/dL    Comment: Glucose reference range applies only to samples taken after fasting for at least 8 hours.  Glucose, capillary     Status: Abnormal   Collection Time: 11/16/23  3:54 PM  Result Value Ref Range   Glucose-Capillary 302 (H) 70 - 99 mg/dL    Comment: Glucose reference range applies only to samples taken after fasting for at least 8 hours.  Glucose, capillary     Status: Abnormal   Collection Time: 11/16/23  8:53 PM  Result Value Ref Range   Glucose-Capillary 297 (H) 70 - 99 mg/dL    Comment: Glucose reference range applies only to samples taken after fasting for at least 8 hours.  Blood gas, arterial     Status: Abnormal   Collection Time: 11/17/23  1:00 AM  Result Value Ref Range   pH, Arterial 7.27 (L) 7.35 - 7.45   pCO2 arterial 32 32 - 48 mmHg   pO2, Arterial 108 83 - 108 mmHg   Bicarbonate 14.7 (L) 20.0 - 28.0 mmol/L   Acid-base deficit 11.0 (H) 0.0 - 2.0 mmol/L   O2 Saturation 98.3 %   Patient temperature 36.9    Collection site LEFT RADIAL    Drawn by [id number]    Allens test (pass/fail) PASS PASS    Comment: Performed at [facility], [address] [place], [place] [address]  CBC     Status: Abnormal   Collection Time: 11/17/23  2:24 AM  Result Value Ref Range   WBC 14.4 (H) 4.0 - 10.5 K/uL   RBC 3.25 (L) 3.87 - 5.11 MIL/uL   Hemoglobin 8.6 (L) 12.0 - 15.0 g/dL   HCT 71.0 (L) 63.9 - 53.9 %   MCV 88.9 80.0 - 100.0 fL   MCH 26.5 26.0 - 34.0 pg   MCHC 29.8 (L) 30.0 - 36.0 g/dL   RDW 80.3 (H) 88.4 - 84.4 %   Platelets 201 150 - 400 K/uL   nRBC 3.0 (H) 0.0 - 0.2 %    Comment: Performed at [facility], [address], [place], [place] [address]  Renal function panel     Status: Abnormal   Collection Time: 11/17/23  2:24 AM  Result Value Ref Range   Sodium 138 135 - 145 mmol/L   Potassium 4.5 3.5 - 5.1 mmol/L   Chloride 104 98 - 111 mmol/L   CO2 16 (L) 22 - 32 mmol/L   Glucose, Bld 240 (H) 70 - 99 mg/dL    Comment: Glucose reference range  applies only to samples taken after fasting for at least 8 hours.   BUN 80 (H) 8 - 23 mg/dL   Creatinine, Ser 4.05 (H) 0.44 - 1.00 mg/dL   Calcium  7.3 (L) 8.9 - 10.3 mg/dL   Phosphorus 6.4 (H) 2.5 - 4.6 mg/dL   Albumin  2.3 (L) 3.5 - 5.0 g/dL   GFR, Estimated 7 (L) >60 mL/min    Comment: (NOTE) Calculated using the CKD-EPI Creatinine Equation (2021)    Anion gap 18 (H) 5 - 15    Comment: Performed at [facility], [address], [place], [place] [address]  Glucose, capillary     Status: Abnormal   Collection Time: 11/17/23  5:57 AM  Result Value Ref Range   Glucose-Capillary 237 (H) 70 - 99 mg/dL    Comment: Glucose reference range applies only to samples taken after fasting for at least 8 hours.   CT HEAD WO CONTRAST ( ) Result Date: 11/17/2023 CLINICAL DATA:  Mental status change, persistent or worsening EXAM: CT HEAD WITHOUT CONTRAST TECHNIQUE: Contiguous axial images were obtained from the base of the skull through the vertex without intravenous contrast. RADIATION DOSE REDUCTION: This exam was performed according to the departmental dose-optimization program which includes automated exposure control, adjustment of the mA and/or kV according to patient size and/or use of iterative reconstruction technique. COMPARISON:  MRI head from earlier today. FINDINGS: Brain: Known acute infarct in the right frontal lobe better characterized on recent MRI. No progressive mass effect or acute hemorrhage. No midline shift. No hydrocephalus. Patchy white matter disease, compatible with chronic microvascular ischemic disease. Small remote bilateral basal ganglia lacunar infarcts. Small remote left cerebellar infarct. Cerebral atrophy. Vascular: Calcific atherosclerosis. Skull: No acute fracture. Sinuses/Orbits: Clear sinuses.  No acute orbital findings. Other: No mastoid effusions. IMPRESSION: Known small acute infarct in the right frontal lobe better characterized on recent MRI. No progressive mass  effect or acute hemorrhage. Electronically Signed   By: Gilmore GORMAN Molt M.D.   On: 11/17/2023 01:33   MR BRAIN WO CONTRAST Result Date: 11/17/2023 EXAM DESCRIPTION: MR BRAIN WO CONTRAST CLINICAL HISTORY: Mental status change, unknown cause COMPARISON: None available TECHNIQUE: Non contrast MRI of the brain is performed according to our usual protocol including multiplanar multi sequence technique. FINDINGS: There is a 5 mm acute/subacute infarct in the right frontal lobe seen on diffusion image 82. No other acute intracranial hemorrhage, mass, edema, or hydrocephalus. Mild to moderate cortical atrophy. Very small chronic infarcts left cerebellum. Small bilateral chronic basal ganglia lacunar infarcts. Mild to moderate white matter disease suggesting chronic small vessel ischemic change. The vascular flow voids are unremarkable. No significant sinus disease. IMPRESSION: 5 mm acute/subacute infarct in the right frontal lobe. Age-related change. Very small chronic infarcts in the left cerebellum. Electronically signed by: Reyes Frees MD 11/17/2023 12:35 AM EDT RP Workstation: [id number]   DG CHEST PORT 1 VIEW Result Date: 11/16/2023 CLINICAL DATA:  Rales. EXAM: PORTABLE CHEST 1 VIEW COMPARISON:  Radiograph 11/12/2023 FINDINGS: Right-sided dialysis catheter with  tip overlying the right atrium. No pneumothorax. Cardiomegaly is unchanged. Mediastinal contours are stable. Improving vascular congestion. Suspected small pleural effusions. IMPRESSION: 1. Improving vascular congestion. Suspected small pleural effusions. 2. Unchanged cardiomegaly. 3. Right dialysis catheter tip overlies the right atrium. Electronically Signed   By: Andrea Gasman M.D.   On: 11/16/2023 21:42   IR Fluoro Guide CV Line Right Result Date: 11/16/2023 CLINICAL DATA:  Renal failure and need for non tunneled dialysis catheter for immediate hemodialysis. EXAM: NON-TUNNELED CENTRAL VENOUS HEMODIALYSIS CATHETER PLACEMENT WITH ULTRASOUND  AND FLUOROSCOPIC GUIDANCE FLUOROSCOPY: Radiation Exposure Index: 17.3 mGy Kerma PROCEDURE: The procedure, risks, benefits, and alternatives were explained to the patient. Questions regarding the procedure were encouraged and answered. The patient understands and consents to the procedure. A time out was performed prior to initiating the procedure. Ultrasound was used to confirm patency of the right internal jugular vein. An ultrasound image was saved and recorded. The right neck and chest were prepped with chlorhexidine  in a sterile fashion, and a sterile drape was applied covering the operative field. Maximum barrier sterile technique with sterile gowns and gloves were used for the procedure. Local anesthesia was provided with 1% lidocaine . After creating a small venotomy incision, a 21 gauge needle was advanced into the right internal jugular vein under direct, real-time ultrasound guidance. Ultrasound image documentation was performed. After securing guidewire access,the venotomy was dilated over a guide wire. A 16 cm, 13 Fr, triple lumen non-tunneled dialysis catheter was advanced over the wire. Final catheter positioning was confirmed and documented with a fluoroscopic spot image. The catheter was aspirated, flushed with saline, and injected with appropriate volume heparin  dwells. The catheter exit site was secured with 0-Prolene retention sutures. COMPLICATIONS: None.  No pneumothorax. FINDINGS: After catheter placement, the tip lies at the cavoatrial junction. The catheter aspirates normally and is ready for immediate use. IMPRESSION: Placement of non-tunneled central venous hemodialysis catheter via the right internal jugular vein. The catheter tip lies at the cavoatrial junction. The catheter is ready for immediate use. Electronically Signed   By: Marcey Moan M.D.   On: 11/16/2023 17:03   IR US  Guide Vasc Access Right Result Date: 11/16/2023 CLINICAL DATA:  Renal failure and need for non tunneled  dialysis catheter for immediate hemodialysis. EXAM: NON-TUNNELED CENTRAL VENOUS HEMODIALYSIS CATHETER PLACEMENT WITH ULTRASOUND AND FLUOROSCOPIC GUIDANCE FLUOROSCOPY: Radiation Exposure Index: 17.3 mGy Kerma PROCEDURE: The procedure, risks, benefits, and alternatives were explained to the patient. Questions regarding the procedure were encouraged and answered. The patient understands and consents to the procedure. A time out was performed prior to initiating the procedure. Ultrasound was used to confirm patency of the right internal jugular vein. An ultrasound image was saved and recorded. The right neck and chest were prepped with chlorhexidine  in a sterile fashion, and a sterile drape was applied covering the operative field. Maximum barrier sterile technique with sterile gowns and gloves were used for the procedure. Local anesthesia was provided with 1% lidocaine . After creating a small venotomy incision, a 21 gauge needle was advanced into the right internal jugular vein under direct, real-time ultrasound guidance. Ultrasound image documentation was performed. After securing guidewire access,the venotomy was dilated over a guide wire. A 16 cm, 13 Fr, triple lumen non-tunneled dialysis catheter was advanced over the wire. Final catheter positioning was confirmed and documented with a fluoroscopic spot image. The catheter was aspirated, flushed with saline, and injected with appropriate volume heparin  dwells. The catheter exit site was secured with 0-Prolene retention sutures. COMPLICATIONS:  None.  No pneumothorax. FINDINGS: After catheter placement, the tip lies at the cavoatrial junction. The catheter aspirates normally and is ready for immediate use. IMPRESSION: Placement of non-tunneled central venous hemodialysis catheter via the right internal jugular vein. The catheter tip lies at the cavoatrial junction. The catheter is ready for immediate use. Electronically Signed   By: Marcey Moan M.D.   On:  11/16/2023 17:03    PMH:   Past Medical History:  Diagnosis Date   Atypical chest pain    a. non obstructive by cath in 2008 and 2010;  b. 02/2012 Myoview : non-ischemic, EF 57%   Cellulitis    a. left foot-> s/p L BKA   Chronic kidney disease    Constipation    CVA (cerebral infarction)    a.  Small right parietal noted incidentally 04/2007;  b. right sided embolic CVA 05/2012;  c. TEE 2/14:  LVH, EF 55-60%, mild LAE, no LAA clot, no PFO, no R->L shunt by echo contrast, oscillating density on AV likely Lambl's Excressence    Diabetes mellitus, type II (HCC)    Diabetic retinopathy (HCC)    NPDR w/edema OU   Diaphragmatic hernia without mention of obstruction or gangrene    Dry skin    Esophageal reflux    HTN (hypertension)    Hx of BKA (HCC)    Bilateral   Hyperlipidemia    Irritable bowel syndrome    Morbid obesity (HCC)    Obesity, unspecified    Pain in shoulder 06/28/2015   Peripheral neuropathy    Stroke [facility])    2013-'no residual'   Syncope and collapse    a. near-syncopal episode in November 2008;  b. 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   Tobacco abuse    Vertigo     PSH:   Past Surgical History:  Procedure Laterality Date   AMPUTATION  12/30/2011   Procedure: AMPUTATION RAY;  Surgeon: Norleen Armor, MD;  Location[facility] OR;  Service: Orthopedics;  Laterality: Left;  LEFT FIRST RAY AMPUTATION   AMPUTATION  01/13/2012   Procedure: AMPUTATION BELOW KNEE;  Surgeon: Norleen Armor, MD;  Location: [facility] OR;  Service: Orthopedics;  Laterality: Left;   AMPUTATION  03/04/2012   Procedure: AMPUTATION BELOW KNEE;  Surgeon: Norleen Armor, MD;  Location: [facility] OR;  Service: Orthopedics;  Laterality: Left;  Revision of Left Below Knee Amputation   AMPUTATION Right 11/02/2013   Procedure: AMPUTATION BELOW KNEE;  Surgeon: Jerona Harden GAILS, MD;  Location: [facility] OR;  Service: Orthopedics;  Laterality: Right;  Right Below Knee Amputation   BIOPSY  03/29/2019   Procedure: BIOPSY;  Surgeon: San Sandor GAILS, DO;  Location: [facility] ENDOSCOPY;  Service: Gastroenterology;;  EGD and Colon   CARDIAC CATHETERIZATION     LAD 30%, circumflex 50%, OM 75%, RI 60% with small branch 80%, dominant RCA 60%, EF 45-50%   CHOLECYSTECTOMY     COLONOSCOPY     COLONOSCOPY WITH PROPOFOL  N/A 03/29/2019   Procedure: COLONOSCOPY WITH PROPOFOL ;  Surgeon: San Sandor GAILS, DO;  Location: [facility] ENDOSCOPY;  Service: Gastroenterology;  Laterality: N/A;   ESOPHAGOGASTRODUODENOSCOPY (EGD) WITH PROPOFOL  N/A 03/29/2019   Procedure: ESOPHAGOGASTRODUODENOSCOPY (EGD) WITH PROPOFOL ;  Surgeon: San Sandor GAILS, DO;  Location: [facility];  Service: Gastroenterology;  Laterality: N/A;   EYE SURGERY Right    cataract   I & D EXTREMITY  12/23/2011   Procedure: IRRIGATION AND DEBRIDEMENT EXTREMITY;  Surgeon: Elspeth JONELLE Her, MD;  Location: [facility] OR;  Service: Orthopedics;  Laterality: Left;  Left Foot   I & D EXTREMITY  12/26/2011   Procedure: IRRIGATION AND DEBRIDEMENT EXTREMITY;  Surgeon: Norleen Armor, MD;  Location: [facility] OR;  Service: Orthopedics;  Laterality: Left;  LEFT FOOT I&D WITH POSSIBLE WOUND VAC APPLICATION, POSSIBLE LEFT FIRST RAY AMPUTATION   I & D EXTREMITY Right 10/29/2013   Procedure: IRRIGATION AND DEBRIDEMENT EXTREMITY;  Surgeon: Reyes JAYSON Billing, MD;  Location: [facility] OR;  Service: Orthopedics;  Laterality: Right;   Invasive Electrophysiologic Study  5/09   followed by insertion of an implantable loop recorder. S/p removal    IR FLUORO GUIDE CV LINE RIGHT  11/16/2023   IR US  GUIDE VASC ACCESS RIGHT  11/16/2023   Multiple Toe Surgeries     POLYPECTOMY  03/29/2019   Procedure: POLYPECTOMY;  Surgeon: San Sandor GAILS, DO;  Location: [facility] END[facility];  Service: Gastroenterology;;   RIGHT/LEFT HEART CATH AND CORONARY ANGIOGRAPHY N/A 09/07/2023   Procedure: RIGHT/LEFT HEART CATH AND CORONARY ANGIOGRAPHY;  Surgeon: Anner Alm ORN, MD;  Location: [facility] INVASIVE CV LAB;  Service: Cardiovascular;  Laterality: N/A;   TEE WITHOUT CARDIOVERSION  N/A 07/07/2012   Procedure: TRANSESOPHAGEAL ECHOCARDIOGRAM (TEE);  Surgeon: Redell GORMAN Shallow, MD;  Location: [facility] ENDOSCOPY;  Service: Cardiovascular;  Laterality: N/A;    Allergies:  Allergies  Allergen Reactions   Banana Swelling and Other (See Comments)    Facial swelling   Penicillins Anaphylaxis, Itching and Rash   Strawberry Extract Swelling    Facial swelling    Medications:   Prior to Admission medications   Medication Sig Start Date End Date Taking? Authorizing Provider  albuterol  (VENTOLIN  HFA) 108 (90 Base) MCG/ACT inhaler Inhale 2 puffs into the lungs in the morning and at bedtime.   Yes [provider]  aspirin  EC 81 MG tablet Take 81 mg by mouth daily.   Yes [provider]  atorvastatin  (LIPITOR ) 80 MG tablet Take 1 tablet (80 mg total) by mouth daily. 09/17/23  Yes Arrien, Elidia Sieving, MD  chlorhexidine  (PERIDEX ) 0.12 % solution Use as directed 5 mLs in the mouth or throat 2 (two) times daily.   Yes [provider]  Cholecalciferol 1.25 MG (50000 UT) TABS Take 50,000 Units by mouth once a week. Take one tablet by mouth every Monday.   Yes [provider]  clopidogrel  (PLAVIX ) 75 MG tablet Take 1 tablet (75 mg total) by mouth daily. 09/17/23  Yes Arrien, Mauricio Daniel, MD  Dulaglutide 3 MG/0.5ML SOAJ Inject 4.5 mg into the skin once a week. Every Wednesday   Yes [provider]  ferrous sulfate  325 (65 FE) MG tablet Take 325 mg by mouth 2 (two) times daily.   Yes [provider]  furosemide  (LASIX ) 80 MG tablet Take 1 tablet (80 mg total) by mouth daily. 09/17/23  Yes Arrien, Elidia Sieving, MD  hydrALAZINE  (APRESOLINE ) 25 MG tablet Take 1 tablet (25 mg total) by mouth every 8 (eight) hours. 09/16/23  Yes Arrien, Mauricio Daniel, MD  Insulin  Glargine (BASAGLAR  [facility]) 100 UNIT/ML Inject 15 Units into the skin 2 (two) times daily.   Yes [provider]  insulin  lispro (HUMALOG) 100 UNIT/ML injection Inject 4-14  Units into the skin See admin instructions. Inject 14 units into the skin two times daily in addition to sliding scale 150-200=4 units,201-250 = 8 units, 251-300 = 8 units, 301-350= 10 units, 351-400= 10 units, 401-450=14 units, 451-500 =12 units >500 = 14 units   Yes [provider]  ipratropium-albuterol  (DUONEB) 0.5-2.5 (3) MG/3ML SOLN Inhale  3 mLs into the lungs every 8 (eight) hours as needed (SOB/wheezing). 09/20/23  Yes [provider]  isosorbide  dinitrate (ISORDIL ) 20 MG tablet Take 1 tablet (20 mg total) by mouth 2 (two) times daily. 09/16/23  Yes Arrien, Mauricio Daniel, MD  loratadine (CLARITIN) 10 MG tablet Take 10 mg by mouth daily.   Yes [provider]  magnesium  hydroxide (MILK OF MAGNESIA) 400 MG/5ML suspension Take 30 mLs by mouth daily.   Yes [provider]  metFORMIN  (GLUCOPHAGE ) 500 MG tablet Take 500 mg by mouth daily. 07/13/23  Yes [provider]  Metoprolol  Tartrate 37.5 MG TABS Take 1 tablet by mouth daily. Hold for SBP<110 or pulse <80 10/10/23  Yes [provider]  nystatin (MYCOSTATIN/NYSTOP) powder Apply 1 Application topically 3 (three) times daily. Apply to skin folds/ R side neck topically threes times a day for candidiasis. Apply to reddened areas on inner elbows and abdominal fold/R side of neck   Yes [provider]  Olopatadine HCl 0.7 % SOLN Place 1 drop into both eyes daily.   Yes [provider]  polyethylene glycol (MIRALAX  / GLYCOLAX ) packet Take 17 g by mouth daily as needed for moderate constipation. 11/04/13  Yes Rizwan, Saima, MD  senna-docusate (SENOKOT-S) 8.6-50 MG tablet Take 1 tablet by mouth daily.   Yes [provider]  tetrahydrozoline 0.05 % ophthalmic solution Place 1 drop into both eyes at bedtime.   Yes [provider]  Tetrahydrozoline HCl (VISINE OP) Place 1 drop into both eyes at bedtime.   Yes [provider]  vitamin C  (ASCORBIC ACID) 500 MG tablet  Take 500 mg by mouth daily.   Yes [provider]  Zinc  Oxide 40 % PSTE Apply 1 Application topically 2 (two) times daily. Apply to sacrum topically every day and night shift; may also apply with each incontinent episode/   Yes [provider]  OXYGEN Inhale 2 L into the lungs as needed (Dyspnea).    [provider]    Discontinued Meds:   Medications Discontinued During This Encounter  Medication Reason   LANTUS  100 UNIT/ML injection Change in therapy   ergocalciferol (VITAMIN D2) 1.25 MG (50000 UT) capsule Change in therapy   PAZEO 0.7 % SOLN Change in therapy   Bevacizumab  (AVASTIN ) SOLN 1.25 mg Completed Course   Bevacizumab  (AVASTIN ) SOLN 1.25 mg Completed Course   Bevacizumab  (AVASTIN ) SOLN 1.25 mg Completed Course   Bevacizumab  (AVASTIN ) SOLN 1.25 mg Completed Course   metroNIDAZOLE  (FLAGYL ) IVPB 500 mg    metoprolol  succinate (TOPROL -XL) 25 MG 24 hr tablet Change in therapy   acetaminophen  (ACETAMINOPHEN  8 HOUR) 650 MG CR tablet Discontinued by provider   Loperamide HCl (IMODIUM PO) Discontinued by provider   Sodium Fluoride 1.1 % PSTE Discontinued by provider   Cholecalciferol (VITAMIN D) 50 MCG (2000 UT) tablet Dose change   LANTUS  SOLOSTAR 100 UNIT/ML Solostar Pen Change in therapy   albuterol  (PROVENTIL ) (2.5 MG/3ML) 0.083% nebulizer solution 2.5 mg    vancomycin  (VANCOREADY) IVPB 1750 mg/350 mL    furosemide  (LASIX ) injection 80 mg    ceFEPIme  (MAXIPIME ) 2 g in sodium chloride  0.9 % 100 mL IVPB    heparin  ADULT infusion 100 units/mL (25000 units/250mL)    morphine  (PF) 2 MG/ML injection 2 mg    0.9 %  sodium chloride  infusion    sodium chloride  HYPERTONIC 3 % nebulizer solution 4 mL    aspirin  tablet 325 mg     Social History:  reports that she has quit smoking. Her smoking use included cigarettes. She has a 10 pack-year smoking history. She has never used smokeless tobacco. She reports current alcohol use of about 4.0 standard drinks of  alcohol per week. She reports that she does not use drugs.  Family History:   Family History  Problem Relation Age of Onset   Pneumonia Mother    Gallbladder disease Mother        cancer   Heart failure Mother    Diabetes Father    Coronary artery disease Father    Stroke Neg Hx     Blood pressure 118/62, pulse 84, temperature 97.8 F (36.6 C), temperature source Axillary, resp. rate (!) 27, height 5' 6 (1.676 m), weight 107 kg, SpO2 100%. Physical Exam: Gen alert, NAD, PS 40% FIO2 No rash, cyanosis or gangrene Sclera anicteric, throat clear  No jvd or bruits Chest clear bilat to bases, poor effort, RR20 RRR no MRG Abd soft ntnd no mass or ascites +bs GU no foley Ext bilat BKA, 1+ bilateral hip edema, no UE edema Neuro is awake, disoriented and confused      MELIA LYNWOOD ORN, MD 11/17/2023, 8:23 AM

## 2023-11-17 NOTE — Inpatient Diabetes Management (Signed)
 Inpatient Diabetes Program Recommendations  AACE/ADA: New Consensus Statement on Inpatient Glycemic Control  Target Ranges:  Prepandial:   less than 140 mg/dL      Peak postprandial:   less than 180 mg/dL (1-2 hours)      Critically ill patients:  140 - 180 mg/dL    Latest Reference Range & Units 11/16/23 08:01 11/16/23 11:19 11/16/23 15:54 11/16/23 20:53 11/17/23 05:57  Glucose-Capillary 70 - 99 mg/dL 801 (H) 748 (H) 697 (H) 297 (H) 237 (H)   Review of Glycemic Control  Diabetes history: DM2 Outpatient Diabetes medications: Lantus  15 units BID, Humalog 4-14 units QAM and QPM, Trulicity 4.5 mg Qweek, Metformin  500 mg daily Current orders for Inpatient glycemic control: Semglee  6 units daily, Novolog  0-9 units TID with meal, Novolog  0-5 units QHS   Inpatient Diabetes Program Recommendations:    Insulin : Noted patient is NPO today.  Please consider increasing Semglee  to 9 units daily and Novolog  correction to 0-15 units TID with meals.  Thanks, Earnie Gainer, RN, MSN, CDCES Diabetes Coordinator Inpatient Diabetes Program [phone] (Team Pager from 8am to 5pm)

## 2023-11-17 NOTE — TOC Progression Note (Signed)
 Transition of Care [facility]) - Progression Note    Patient Details  Name: [name] MRN: [id number] Date of Birth: [birth year]  [facility] CM/SW Contact  Luise JAYSON Pan, [place] Phone Number: 11/17/2023, 3:48 PM  Clinical Narrative:   CSW updated facility on patient's expected dc date. CSW will continue to follow for bipap needs as patient is back on bipap.   TOC will continue to follow.    Expected Discharge Plan: Long Term Nursing Home Barriers to Discharge: Continued Medical Work up  Expected Discharge Plan and Services In-house Referral: Clinical Social Work     Living arrangements for the past 2 months: Skilled Nursing Facility                                       Social Determinants of Health (SDOH) Interventions SDOH Screenings   Food Insecurity: No Food Insecurity (11/12/2023)  Housing: Low Risk  (11/12/2023)  Transportation Needs: No Transportation Needs (11/12/2023)  Utilities: Not At Risk (11/12/2023)  Social Connections: Unknown (11/12/2023)  Tobacco Use: Medium Risk (11/11/2023)    Readmission Risk Interventions     No data to display

## 2023-11-17 NOTE — Progress Notes (Signed)
 PT Cancellation Note  Patient Details Name: [name] MRN: [id number] DOB: 1955/09/09   Cancelled Treatment:    Reason Eval/Treat Not Completed: PT screened, no needs identified, will sign off (at baseline pt self-feeds but is dependend for all mobility and transfers with lift. LTC resident at [facility]. With new CVA will defer needs to OT)   Brooks Stotz B Zia Kanner 11/17/2023, 7:05 AM Lenoard SQUIBB, PT Acute Rehabilitation Services Office: [phone]

## 2023-11-17 NOTE — Progress Notes (Signed)
 SLP Cancellation Note  Patient Details Name: [name] MRN: [id number] DOB: January 08, 1956   Cancelled treatment:       Reason Eval/Treat Not Completed: Patient not medically ready due to BiPaP needs. SLP will follow up as clinically indicated.    [name] 11/17/2023, 8:09 AM

## 2023-11-17 NOTE — Progress Notes (Signed)
 Notified Dr. Jonel that patient had 7 beat run of VT.

## 2023-11-17 NOTE — Progress Notes (Signed)
 Spoke to RN to coordinate a good time, Pt going to HD will hold off on EEG until HD is complete. Asked RN to let EEG team know when she is available for EEG.

## 2023-11-17 NOTE — Progress Notes (Signed)
   11/17/23 1305  Vitals  Temp (!) 97.1 F (36.2 C)  Pulse Rate 77  Resp 19  BP 104/60  SpO2 100 %  Weight 104.5 kg  Type of Weight Post-Dialysis  Oxygen Therapy  Patient Activity (if Appropriate) In bed  Pulse Oximetry Type Continuous  Oximetry Probe Site Changed No  Post Treatment  Dialyzer Clearance Lightly streaked  Hemodialysis Intake (mL) 0 mL  Liters Processed 45  Fluid Removed (mL) 2000 mL  Tolerated HD Treatment Yes   Received patient in bed to unit.  Alert and oriented.  Informed consent signed and in chart.   TX duration:2.5HRS  Patient tolerated well.  Transported back to the room  Alert, without acute distress.  Hand-off given to patient's nurse.   Access used: [facility] Access issues: NONE  Total UF removed: 2L Medication(s) given: NONE    [facility]

## 2023-11-17 DEATH — deceased

## 2023-11-18 ENCOUNTER — Inpatient Hospital Stay (HOSPITAL_COMMUNITY)

## 2023-11-18 DIAGNOSIS — R6520 Severe sepsis without septic shock: Secondary | ICD-10-CM | POA: Diagnosis not present

## 2023-11-18 DIAGNOSIS — I6523 Occlusion and stenosis of bilateral carotid arteries: Secondary | ICD-10-CM

## 2023-11-18 DIAGNOSIS — R29709 NIHSS score 9: Secondary | ICD-10-CM

## 2023-11-18 DIAGNOSIS — Z7189 Other specified counseling: Secondary | ICD-10-CM | POA: Diagnosis not present

## 2023-11-18 DIAGNOSIS — I4891 Unspecified atrial fibrillation: Secondary | ICD-10-CM | POA: Diagnosis not present

## 2023-11-18 DIAGNOSIS — I6389 Other cerebral infarction: Secondary | ICD-10-CM | POA: Diagnosis not present

## 2023-11-18 DIAGNOSIS — Z515 Encounter for palliative care: Secondary | ICD-10-CM | POA: Diagnosis not present

## 2023-11-18 DIAGNOSIS — A419 Sepsis, unspecified organism: Secondary | ICD-10-CM | POA: Diagnosis not present

## 2023-11-18 LAB — CBC
HCT: 26.8 % — ABNORMAL LOW (ref 36.0–46.0)
Hemoglobin: 8.5 g/dL — ABNORMAL LOW (ref 12.0–15.0)
MCH: 26.4 pg (ref 26.0–34.0)
MCHC: 31.7 g/dL (ref 30.0–36.0)
MCV: 83.2 fL (ref 80.0–100.0)
Platelets: 229 10*3/uL (ref 150–400)
RBC: 3.22 MIL/uL — ABNORMAL LOW (ref 3.87–5.11)
RDW: 19.2 % — ABNORMAL HIGH (ref 11.5–15.5)
WBC: 16.4 10*3/uL — ABNORMAL HIGH (ref 4.0–10.5)
nRBC: 2.8 % — ABNORMAL HIGH (ref 0.0–0.2)

## 2023-11-18 LAB — GLUCOSE, CAPILLARY
Glucose-Capillary: 104 mg/dL — ABNORMAL HIGH (ref 70–99)
Glucose-Capillary: 109 mg/dL — ABNORMAL HIGH (ref 70–99)
Glucose-Capillary: 120 mg/dL — ABNORMAL HIGH (ref 70–99)
Glucose-Capillary: 127 mg/dL — ABNORMAL HIGH (ref 70–99)
Glucose-Capillary: 114 mg/dL — ABNORMAL HIGH (ref 70–99)
Glucose-Capillary: 121 mg/dL — ABNORMAL HIGH (ref 70–99)
Glucose-Capillary: 118 mg/dL — ABNORMAL HIGH (ref 70–99)

## 2023-11-18 LAB — RENAL FUNCTION PANEL
Albumin: 2.4 g/dL — ABNORMAL LOW (ref 3.5–5.0)
Anion gap: 17 — ABNORMAL HIGH (ref 5–15)
BUN: 52 mg/dL — ABNORMAL HIGH (ref 8–23)
CO2: 20 mmol/L — ABNORMAL LOW (ref 22–32)
Calcium: 7.3 mg/dL — ABNORMAL LOW (ref 8.9–10.3)
Chloride: 100 mmol/L (ref 98–111)
Creatinine, Ser: 4.53 mg/dL — ABNORMAL HIGH (ref 0.44–1.00)
GFR, Estimated: 10 mL/min — ABNORMAL LOW (ref >60)
Glucose, Bld: 106 mg/dL — ABNORMAL HIGH (ref 70–99)
Phosphorus: 4.9 mg/dL — ABNORMAL HIGH (ref 2.5–4.6)
Potassium: 4.2 mmol/L (ref 3.5–5.1)
Sodium: 137 mmol/L (ref 135–145)

## 2023-11-18 NOTE — Progress Notes (Addendum)
 PROGRESS NOTE    [name]  FMW:[id number] DOB: 04/13/1956 DOA: 11/11/2023 PCP: Ellaree Hansel [name], MD   68 y.o. F with obesity, sCHF EF 20-25%, recent NSTEMI treated medically, DM and hx bilat BKA, HTN, CKD IIIa baseline 1.2 and hx stroke without residual who presented with SOB.  -Recently admitted for NSTEMI/CHF, underwent LHC that showed severe calcific disease, 80% mid-LAD, 90% prox RCA and 90% ramus intermedius, no PCI due to technically challenging targets, not CABG candidate.  EF 20-25%. -Recovered afterwards and was feeling well, until a day or two before admission, became SOB, sent from facility to hospital.    -In the ER, CXR showed bilateral opacities. Lactate >5.  New atrial fibrillation.  Suspected CHF, pneumonia sepsis, started on antibiotics, diuretics, Cardiology consulted.  -Subsequent hospital course complicated by renal failure.  CVC placed 6/30, initiated dialysis 7/1. -6/30 after temporary HD catheter was placed patient was noted to be nonverbal, MRI obtained noted 5 mm stroke in right frontal lobe - Palliative care following   Subjective: Patient seen on dialysis, aphasic  Assessment and Plan:  Severe sepsis due to community acquired pneumonia (HCC) Acute respiratory failure with hypoxia Presented with tachycardia, tachypnea, leukocytosis, and respiratory failure with lactic acid greater than 5.  Chest x-ray shows bilateral infiltrates.  Procalcitonin 0.6, trended up to >2. -MRSA nares negative. - Day 7 of cefepime  today, discontinue antibiotics - Bronchodilators - Aggressive pulmonary toilet - Continue midodrine   Acute renal failure superimposed on stage 3a chronic kidney disease (HCC) Baseline creatinine appears to be around 1.2 until her recent admission for CHF and NSTEMI, -now developed oliguric renal failure, likely ischemic ATN in the setting of IV contrast, sepsis, vancomycin , hypotension on 6/27 and diuresis for CHF. - Renal US  unremarkable.    -Vanc stopped, creatinine continued trending up.  Fluids/albumin  given but Cr worsened, increased WOB.  Nephrology consulted, HD catheter placed started hemodialysis 7/1 -Nephrology and palliative care following  Acute on chronic combined systolic and diastolic CHF (congestive heart failure) [facility]) -  Cardiology consulted have since signed off.  EF 20-25% with advanced CAD not amenable to intervention. - Now complicated by AKI started HD, GDMT limited by hypotension -Dr.Danford d/w advanced heart failure team again on 7/1, given multiorgan failure, they do not recommend any additional interventions at this time  Acute CVA, lacunar infarct Acute metabolic encephalopathy At baseline, patient has no known cognitive impairment.   - On 6/30 noted to be aphasic, less responsive  -MRI brain was obtained that showed small lacunar infarct.  In right frontal lobe  -Consensus from neurology is that infarct likely incidental.  -Neurology recommended aspirin  and Plavix  for 3 weeks followed by Plavix  alone - Follow-up carotid duplex, LDL is 61, A1c 7  Chronic iron  deficiency anemia Hemoglobin trending down in the setting of renal failure and phlebotomy. No clinical bleeding observed or reported. - Follow Hgb   Type 2 diabetes mellitus with hyperlipidemia (HCC) Remains somewhat hyperglycemic - Hold metformin , GLP-1 - Continue low dose glargine - Continue sliding scale corrections, renal function worsening will defer changing for now  Essential hypertension Cerebrovascular disease Hypotensive earlier in hospital stay, currently stable on midodrine  - Continue midodrine  - Holding Lasix , Isordil , hydralazine , metoprolol    HLD (hyperlipidemia) - Continue Lipitor   S/P BKA (below knee amputation) bilateral (HCC)   PAF (paroxysmal atrial fibrillation) (HCC) Previous ILR ~10 yrs ago showed no Afib.  On admission this time, transient Afib, now resolved.  In setting of very brief transient episode  during sepsis, will defer [facility] for now. - Monitor on tele  Demand ischemia due to type 2 NSTEMI [facility]) Severe calcific coronary artery disease with chronic angina Evaluated by Cardiology.  They suspected type 2 NSTEMI, not ACS.   - Continue Lipitor , Plavix   Class 2 obesity BMI 38, morbid obesity in the setting of comorbid diabetes, hypertension  DVT prophylaxis: hep SQ Code Status: Full Code Family Communication: Disposition Plan:   Consultants:    Procedures:   Antimicrobials:    Objective: Vitals:   11/18/23 1130 11/18/23 1200 11/18/23 1205 11/18/23 1300  BP: (!) 101/48 108/88 (!) 104/55 101/66  Pulse: 80 78 77   Resp: (!) 27 (!) 24 (!) 25 (!) 24  Temp:   98.2 F (36.8 C) 98.4 F (36.9 C)  TempSrc:    Oral  SpO2: 93% 90% 95%   Weight:   106.8 kg   Height:        Intake/Output Summary (Last 24 hours) at 11/18/2023 1322 Last data filed at 11/18/2023 1300 Gross per 24 hour  Intake 0 ml  Output 2075 ml  Net -2075 ml   Filed Weights   11/18/23 0845 11/18/23 0853 11/18/23 1205  Weight: 108.4 kg 108.9 kg 106.8 kg    Examination:  General exam: AAO to self, tracks and follows commands, nonverbal Respiratory system: Diffuse scattered rhonchi Cardiovascular system: S1 & S2 heard, RRR.  Abd: nondistended, soft and nontender.Normal bowel sounds heard. Central nervous system: Awake and alert, nonverbal, no other localizing signs Extremities: bilateral BKA Skin: No rashes Psychiatry: Flat affect    Data Reviewed:   CBC: Recent Labs  Lab 11/14/23 0312 11/15/23 1527 11/16/23 0307 11/17/23 0224 11/18/23 0226  WBC 14.3* 13.3* 16.4* 14.4* 16.4*  HGB 8.5* 9.8* 9.7* 8.6* 8.5*  HCT 28.4* 31.9* 32.9* 28.9* 26.8*  MCV 86.1 84.6 86.6 88.9 83.2  PLT 217 218 220 201 229   Basic Metabolic Panel: Recent Labs  Lab 11/14/23 0312 11/15/23 0432 11/16/23 0307 11/17/23 0224 11/18/23 0226  NA 135 135 136 138 137  K 4.2 4.3 4.5 4.5 4.2  CL 102 104 105 104 100  CO2 17*  13* 13* 16* 20*  GLUCOSE 199* 225* 205* 240* 106*  BUN 49* 62* 70* 80* 52*  CREATININE 3.88* 4.38* 4.94* 5.94* 4.53*  CALCIUM  7.3* 7.3* 7.6* 7.3* 7.3*  PHOS  --   --  5.8* 6.4* 4.9*   GFR: Estimated Creatinine Clearance: 14.5 mL/min (A) (by C-G formula based on SCr of 4.53 mg/dL (H)). Liver Function Tests: Recent Labs  Lab 11/12/23 0500 11/13/23 0302 11/16/23 0307 11/17/23 0224 11/18/23 0226  AST 39 32  --   --   --   ALT 16 21  --   --   --   ALKPHOS 103 100  --   --   --   BILITOT 0.6 0.8  --   --   --   PROT 6.4* 6.1*  --   --   --   ALBUMIN  2.6* 2.4* 2.5* 2.3* 2.4*   No results for input(s): LIPASE, AMYLASE in the last 168 hours. No results for input(s): AMMONIA in the last 168 hours. Coagulation Profile: No results for input(s): INR, PROTIME in the last 168 hours. Cardiac Enzymes: No results for input(s): CKTOTAL, CKMB, CKMBINDEX, TROPONINI in the last 168 hours. BNP (last 3 results) No results for input(s): PROBNP in the last 8760 hours. HbA1C: Recent Labs    11/17/23 0830  HGBA1C 7.0*   CBG: Recent  Labs  Lab 11/18/23 0030 11/18/23 0100 11/18/23 0417 11/18/23 0612 11/18/23 1303  GLUCAP 104* 109* 120* 127* 114*   Lipid Profile: Recent Labs    11/17/23 0224  CHOL 80  HDL 29*  LDLCALC 34  TRIG 84  CHOLHDL 2.8   Thyroid  Function Tests: No results for input(s): TSH, T4TOTAL, FREET4, T3FREE, THYROIDAB in the last 72 hours. Anemia Panel: No results for input(s): VITAMINB12, FOLATE, FERRITIN, TIBC, IRON , RETICCTPCT in the last 72 hours. Urine analysis:    Component Value Date/Time   COLORURINE YELLOW 11/14/2023 1641   APPEARANCEUR CLOUDY (A) 11/14/2023 1641   LABSPEC >1.030 (H) 11/14/2023 1641   PHURINE 5.0 11/14/2023 1641   GLUCOSEU 100 (A) 11/14/2023 1641   HGBUR SMALL (A) 11/14/2023 1641   BILIRUBINUR NEGATIVE 11/14/2023 1641   KETONESUR 15 (A) 11/14/2023 1641   PROTEINUR >=300 11/14/2023 1641    UROBILINOGEN 0.2 12/31/2014 1928   NITRITE NEGATIVE 11/14/2023 1641   LEUKOCYTESUR SMALL (A) 11/14/2023 1641   Sepsis Labs: @LABRCNTIP (procalcitonin:4,lacticidven:4)  ) Recent Results (from the past 240 hours)  Blood culture (routine x 2)     Status: None   Collection Time: 11/11/23  1:08 PM   Specimen: BLOOD RIGHT FOREARM  Result Value Ref Range Status   Specimen Description BLOOD RIGHT FOREARM  Final   Special Requests   Final    BOTTLES DRAWN AEROBIC ONLY Blood Culture adequate volume   Culture   Final    NO GROWTH 5 DAYS Performed at [facility], [address], [place], [place] [address]    Report Status 11/16/2023 FINAL  Final  Blood culture (routine x 2)     Status: None   Collection Time: 11/11/23  3:05 PM   Specimen: BLOOD LEFT HAND  Result Value Ref Range Status   Specimen Description BLOOD LEFT HAND  Final   Special Requests   Final    BOTTLES DRAWN AEROBIC AND ANAEROBIC Blood Culture adequate volume   Culture   Final    NO GROWTH 5 DAYS Performed at [facility], [address] [place], [place] [address]    Report Status 11/16/2023 FINAL  Final  MRSA Next Gen by PCR, Nasal     Status: None   Collection Time: 11/13/23  6:51 AM   Specimen: Nasal Mucosa; Nasal Swab  Result Value Ref Range Status   MRSA by PCR Next Gen NOT DETECTED NOT DETECTED Final    Comment: (NOTE) The GeneXpert MRSA Assay (FDA approved for NASAL specimens only), is one component of a comprehensive MRSA colonization surveillance program. It is not intended to diagnose MRSA infection nor to guide or monitor treatment for MRSA infections. Test performance is not FDA approved in patients less than 64 years old. Performed at [facility], [address] [place], [place] [address]      Radiology Studies: EEG adult Result Date: 11/17/2023 Shelton Arlin KIDD, MD     11/17/2023  5:49 PM Patient Name: VICTORINE MCNEE MRN: [id number] Epilepsy Attending: Arlin KIDD Shelton Referring  Physician/Provider: everitt Clint Abbey Earle FORBES, NP Date: 11/17/2023 Duration: 25.36 mins Patient history: 59y F with ams. EEG to evaluate for seizure Level of alertness: Awake AEDs during EEG study: None Technical aspects: This EEG study was done with scalp electrodes positioned according to the 10-20 International system of electrode placement. Electrical activity was reviewed with band pass filter of 1-70Hz , sensitivity of 7 uV/mm, display speed of 77mm/sec with a 60Hz  notched filter applied  as appropriate. EEG data were recorded continuously and digitally stored.  Video monitoring was available and reviewed as appropriate. Description: EEG showed continuous generalized predominantly 5 to 6 Hz theta slowing admixed with intermittent 2-3hz  delta slowing. Hyperventilation and photic stimulation were not performed.   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 No seizures or epileptiform discharges were seen throughout the recording. Priyanka MALVA Krebs   CT HEAD WO CONTRAST ( ) Result Date: 11/17/2023 CLINICAL DATA:  Mental status change, persistent or worsening EXAM: CT HEAD WITHOUT CONTRAST TECHNIQUE: Contiguous axial images were obtained from the base of the skull through the vertex without intravenous contrast. RADIATION DOSE REDUCTION: This exam was performed according to the departmental dose-optimization program which includes automated exposure control, adjustment of the mA and/or kV according to patient size and/or use of iterative reconstruction technique. COMPARISON:  MRI head from earlier today. FINDINGS: Brain: Known acute infarct in the right frontal lobe better characterized on recent MRI. No progressive mass effect or acute hemorrhage. No midline shift. No hydrocephalus. Patchy white matter disease, compatible with chronic microvascular ischemic disease. Small remote bilateral basal ganglia lacunar infarcts. Small remote left  cerebellar infarct. Cerebral atrophy. Vascular: Calcific atherosclerosis. Skull: No acute fracture. Sinuses/Orbits: Clear sinuses.  No acute orbital findings. Other: No mastoid effusions. IMPRESSION: Known small acute infarct in the right frontal lobe better characterized on recent MRI. No progressive mass effect or acute hemorrhage. Electronically Signed   By: Gilmore GORMAN Molt M.D.   On: 11/17/2023 01:33   MR BRAIN WO CONTRAST Result Date: 11/17/2023 EXAM DESCRIPTION: MR BRAIN WO CONTRAST CLINICAL HISTORY: Mental status change, unknown cause COMPARISON: None available TECHNIQUE: Non contrast MRI of the brain is performed according to our usual protocol including multiplanar multi sequence technique. FINDINGS: There is a 5 mm acute/subacute infarct in the right frontal lobe seen on diffusion image 82. No other acute intracranial hemorrhage, mass, edema, or hydrocephalus. Mild to moderate cortical atrophy. Very small chronic infarcts left cerebellum. Small bilateral chronic basal ganglia lacunar infarcts. Mild to moderate white matter disease suggesting chronic small vessel ischemic change. The vascular flow voids are unremarkable. No significant sinus disease. IMPRESSION: 5 mm acute/subacute infarct in the right frontal lobe. Age-related change. Very small chronic infarcts in the left cerebellum. Electronically signed by: Reyes Frees MD 11/17/2023 12:35 AM EDT RP Workstation: [id number]   DG CHEST PORT 1 VIEW Result Date: 11/16/2023 CLINICAL DATA:  Rales. EXAM: PORTABLE CHEST 1 VIEW COMPARISON:  Radiograph 11/12/2023 FINDINGS: Right-sided dialysis catheter with tip overlying the right atrium. No pneumothorax. Cardiomegaly is unchanged. Mediastinal contours are stable. Improving vascular congestion. Suspected small pleural effusions. IMPRESSION: 1. Improving vascular congestion. Suspected small pleural effusions. 2. Unchanged cardiomegaly. 3. Right dialysis catheter tip overlies the right atrium.  Electronically Signed   By: Andrea Gasman M.D.   On: 11/16/2023 21:42   IR Fluoro Guide CV Line Right Result Date: 11/16/2023 CLINICAL DATA:  Renal failure and need for non tunneled dialysis catheter for immediate hemodialysis. EXAM: NON-TUNNELED CENTRAL VENOUS HEMODIALYSIS CATHETER PLACEMENT WITH ULTRASOUND AND FLUOROSCOPIC GUIDANCE FLUOROSCOPY: Radiation Exposure Index: 17.3 mGy Kerma PROCEDURE: The procedure, risks, benefits, and alternatives were explained to the patient. Questions regarding the procedure were encouraged and answered. The patient understands and consents to the procedure. A time out was performed prior to initiating the procedure. Ultrasound was used to confirm patency of the right internal jugular vein. An ultrasound image was saved and recorded. The right neck and chest were  prepped with chlorhexidine  in a sterile fashion, and a sterile drape was applied covering the operative field. Maximum barrier sterile technique with sterile gowns and gloves were used for the procedure. Local anesthesia was provided with 1% lidocaine . After creating a small venotomy incision, a 21 gauge needle was advanced into the right internal jugular vein under direct, real-time ultrasound guidance. Ultrasound image documentation was performed. After securing guidewire access,the venotomy was dilated over a guide wire. A 16 cm, 13 Fr, triple lumen non-tunneled dialysis catheter was advanced over the wire. Final catheter positioning was confirmed and documented with a fluoroscopic spot image. The catheter was aspirated, flushed with saline, and injected with appropriate volume heparin  dwells. The catheter exit site was secured with 0-Prolene retention sutures. COMPLICATIONS: None.  No pneumothorax. FINDINGS: After catheter placement, the tip lies at the cavoatrial junction. The catheter aspirates normally and is ready for immediate use. IMPRESSION: Placement of non-tunneled central venous hemodialysis catheter  via the right internal jugular vein. The catheter tip lies at the cavoatrial junction. The catheter is ready for immediate use. Electronically Signed   By: Marcey Moan M.D.   On: 11/16/2023 17:03   IR US  Guide Vasc Access Right Result Date: 11/16/2023 CLINICAL DATA:  Renal failure and need for non tunneled dialysis catheter for immediate hemodialysis. EXAM: NON-TUNNELED CENTRAL VENOUS HEMODIALYSIS CATHETER PLACEMENT WITH ULTRASOUND AND FLUOROSCOPIC GUIDANCE FLUOROSCOPY: Radiation Exposure Index: 17.3 mGy Kerma PROCEDURE: The procedure, risks, benefits, and alternatives were explained to the patient. Questions regarding the procedure were encouraged and answered. The patient understands and consents to the procedure. A time out was performed prior to initiating the procedure. Ultrasound was used to confirm patency of the right internal jugular vein. An ultrasound image was saved and recorded. The right neck and chest were prepped with chlorhexidine  in a sterile fashion, and a sterile drape was applied covering the operative field. Maximum barrier sterile technique with sterile gowns and gloves were used for the procedure. Local anesthesia was provided with 1% lidocaine . After creating a small venotomy incision, a 21 gauge needle was advanced into the right internal jugular vein under direct, real-time ultrasound guidance. Ultrasound image documentation was performed. After securing guidewire access,the venotomy was dilated over a guide wire. A 16 cm, 13 Fr, triple lumen non-tunneled dialysis catheter was advanced over the wire. Final catheter positioning was confirmed and documented with a fluoroscopic spot image. The catheter was aspirated, flushed with saline, and injected with appropriate volume heparin  dwells. The catheter exit site was secured with 0-Prolene retention sutures. COMPLICATIONS: None.  No pneumothorax. FINDINGS: After catheter placement, the tip lies at the cavoatrial junction. The catheter  aspirates normally and is ready for immediate use. IMPRESSION: Placement of non-tunneled central venous hemodialysis catheter via the right internal jugular vein. The catheter tip lies at the cavoatrial junction. The catheter is ready for immediate use. Electronically Signed   By: Marcey Moan M.D.   On: 11/16/2023 17:03     Scheduled Meds:   stroke: early stages of recovery book   Does not apply Once   albuterol   2.5 mg Nebulization TID   atorvastatin   80 mg Oral Daily   Chlorhexidine  Gluconate Cloth  6 each Topical Q0600   clopidogrel   75 mg Oral Daily   heparin   5,000 Units Subcutaneous Q8H   insulin  aspart  0-15 Units Subcutaneous TID WC   insulin  aspart  0-5 Units Subcutaneous QHS   insulin  glargine-yfgn  9 Units Subcutaneous Daily   midodrine   2.5 mg  Oral TID WC   pantoprazole   40 mg Oral Daily   Ensure Max Protein  11 oz Oral BID   sodium chloride  flush  3 mL Intravenous Q12H   thiamine   100 mg Oral Daily   Continuous Infusions:  ceFEPime  (MAXIPIME ) IV 2 g (11/18/23 0238)     LOS: 7 days    Time spent:    Sigurd Pac, MD [facility]   11/18/2023, 1:22 PM

## 2023-11-18 NOTE — Progress Notes (Signed)
 OT Cancellation Note  Patient Details Name: NAVDEEP FESSENDEN MRN: [id number] DOB: 05-Jan-1956   Cancelled Treatment:    Reason Eval/Treat Not Completed: Patient at procedure or test/ unavailable. Pt is off unit at HD. OT will return as able to.   Lliam Hoh C, OT  Acute Rehabilitation Services Office [phone] Secure chat preferred   Adrianne GORMAN Savers 11/18/2023, 8:46 AM

## 2023-11-18 NOTE — Progress Notes (Signed)
 TCD study was attempted. No windows.  Ricka Elder Molt, RDMS, RVT

## 2023-11-18 NOTE — Consult Note (Signed)
 Palliative Medicine Inpatient Consult Note  Consulting Provider:  Jonel Lonni SQUIBB, MD   Reason for consult:   Palliative Care Consult Services Palliative Medicine Consult  Reason for Consult? Worsening prognosis, goals of care, not longterm HD candidate   11/18/2023  HPI:  Per intake H&P -->  68 y.o. F with obesity, sCHF EF 20-25%, recent NSTEMI treated medically, DM and hx bilat BKA, HTN, CKD IIIa baseline 1.2 and hx stroke without residual who presented with SOB. The Palliative care team has been asked to support additional goals of care conversations.   Clinical Assessment/Goals of Care:  *Please note that this is a verbal dictation therefore any spelling or grammatical errors are due to the Dragon Medical One system interpretation.  I have reviewed medical records including EPIC notes, labs and imaging, received report from bedside RN, assessed the patient who is lying in bed, disoriented.    I called and spoke with patients niece, Alissa to further discuss diagnosis prognosis, GOC, EOL wishes, disposition and options.   I introduced Palliative Medicine as specialized medical care for people living with serious illness. It focuses on providing relief from the symptoms and stress of a serious illness. The goal is to improve quality of life for both the patient and the family.  Medical History Review and Understanding:  DM with retinopathy and peripheral neuropathy, HTN, PAD with B/L AKA, HLD, CVA, CKD 3A as   Social History:  Kazia lives in [place], [place] . Aoi is not married. She has no children other own though is very close to her niece, Alissa. 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 She has strong christian beliefs.   Functional and Nutritional State:  Has been a long term resident at [facility] skilled nursing facility since 2016.   I spoke to [facility] nursing staff who share that Chioma was  dependent for all bADLS. They note that her appetite had decreased since her last hospital stay. She also have worsening bouts of SOB and more recently an inability to hold her pressures.   Advance Directives:  A detailed discussion was had today regarding advanced directives.  Patient has HCPOA paperwork on file and she designates her niece, Luane Pouch to be her surrogate Management consultant.   Code Status:  Concepts specific to code status, artifical feeding and hydration, continued IV antibiotics and rehospitalization was had.  The difference between a aggressive medical intervention path  and a palliative comfort care path for this patient at this time was had.   Encouraged patient/family to consider DNR/DNI status understanding evidenced based poor outcomes in similar hospitalized patient, as the cause of arrest is likely associated with advanced chronic/terminal illness rather than an easily reversible acute cardio-pulmonary event. I explained that DNR/DNI does not change the medical plan and it only comes into effect after a person has arrested (died).  It is a protective measure to keep us  from harming the patient in their last moments of life.   Alissa does not feel that CPR/intubation would be of benefit to the patient though she wants her family to be in agreement with a DNR is it is enacted.   Discussion:  We reviewed Alyce's present hospitalization in the setting of sepsis from a PNA, renal failure requriing initiation on HD, and severe combined heart failure. WE discussed the significance of patients disease burden and poor likelihood of good recovery.   We reviewed the chronic disease trajectory and how each hospitalization has a profound effect  on patients physically, mentally, and emotionally.   Luane shares that Fay was doing alright when she was discharged back to [facility] in April. She had been getting up and participating though was noted to be quite short of  breath.   Temeika notes that Cherika is going downhill since her hospitalization She and her mother would ideally want Masey to be made comfortable and placed at peace though this is a decision which is not agreed upon by all family members. I shared with Alissa she should feel empowered to make the best decision(s) for her aunt as she has been made her HCPOA for a reason.  We discussed the importance of a family meeting to get everyone on the same page. A date and time is pending though Tameika plans to coordinate with the famil.   Discussed the importance of continued conversation with family and their  medical providers regarding overall plan of care and treatment options, ensuring decisions are within the context of the patients values and GOCs.  Decision Maker: Trudy Luane (Niece): [phone] (Home Phone)   SUMMARY OF RECOMMENDATIONS   Full code / Full scope of care --> Patients niece continues to talk to family about a DNR  Open and honest conversations held in the setting of patients acute on chronic disease burden  Continue present care for the time being --> Plan for family meeting in the oncoming days to  discuss with patients family (as a whole) where to go from here  Ongoing PMT support --> I will not be present in the oncoming days though will request my colleague, Stephane Palin follow up with patients family in relation to meeting date and time  Code Status/Advance Care Planning: FULL CODE  Palliative Prophylaxis:  Aspiration, Bowel Regimen, Delirium Protocol, Frequent Pain Assessment, Oral Care, Palliative Wound Care, and Turn Reposition  Additional Recommendations (Limitations, Scope, Preferences): Continue current care  Psycho-social/Spiritual:  Desire for further Chaplaincy support: Yes Additional Recommendations: Education on disease burden    Prognosis: Quite limited hospice care is appropriate  Discharge Planning: To be determined  Vitals:    11/18/23 0412 11/18/23 0729  BP: (!) 100/47 (!) 108/54  Pulse: 82 82  Resp: 20 (!) 21  Temp: 97.9 F (36.6 C) 98.4 F (36.9 C)  SpO2: 100% 98%    Intake/Output Summary (Last 24 hours) at 11/18/2023 0745 Last data filed at 11/17/2023 2045 Gross per 24 hour  Intake 0 ml  Output 2110 ml  Net -2110 ml   Last Weight  Most recent update: 11/18/2023  5:48 AM    Weight  104.5 kg (230 lb 6.1 oz)            Gen:  Elderly AA F chronically ill appearing HEENT: moist mucous membranes CV: Regular rate and rhythm  PULM:  On 3LPM [place], breathing is even and nonlabored ABD: soft/nontender  EXT: (+) edema BL AKAs Neuro: Disoriented  PPS: 30%   This conversation/these recommendations were discussed with patient primary care team, Dr. Fairy ______________________________________________________ Rosaline Becton [facility] Palliative Medicine Team Team Cell Phone: [phone] Please utilize secure chat with additional questions, if there is no response within 30 minutes please call the above phone number  Total Time: 75 Billing based on MDM: High  Palliative Medicine Team providers are available by phone from 7am to 7pm daily and can be reached through the team cell phone.  Should this patient require assistance outside of these hours, please call the patient's attending physician.

## 2023-11-18 NOTE — Evaluation (Signed)
 Speech Language Pathology Evaluation Patient Details Name: [name] MRN: [id number] DOB: 1956/05/18 Today's Date: 11/18/2023 Time: 8590-8574 SLP Time Calculation (min) (ACUTE ONLY): 16 min  Problem List:  Patient Active Problem List   Diagnosis Date Noted   Lacunar infarct, acute (HCC) 11/17/2023   PAF (paroxysmal atrial fibrillation) (HCC) 11/12/2023   Severe sepsis due to community acquired pneumonia (HCC) 11/11/2023   Pressure injury of skin 09/14/2023   Ischemic cardiomyopathy 09/14/2023   Acute renal failure superimposed on stage 3a chronic kidney disease (HCC) 09/08/2023   Leukocytosis 09/07/2023   Chronic diastolic CHF (congestive heart failure) (HCC) 09/07/2023   Type 2 diabetes mellitus with hyperlipidemia (HCC) 09/07/2023   Chronic iron  deficiency anemia 09/07/2023   Acute on chronic combined systolic and diastolic CHF (congestive heart failure) (HCC) 09/07/2023   Demand ischemia due to type 2 NSTEMI (HCC) 09/06/2023   Heme positive stool    Rectal polyp    Adenomatous polyp of transverse colon    Gastritis and gastroduodenitis    Phantom limb pain (HCC) 11/09/2017   Other abnormalities of gait and mobility 11/09/2017   S/P BKA (below knee amputation) bilateral (HCC) 08/20/2015   Dental caries 08/20/2015   Constipation 08/15/2015   Depression 08/15/2015   Pain in shoulder 06/28/2015   Hemiparesis (HCC) 01/10/2013   Tobacco abuse    Paresthesia of right arm and leg 12/17/2011   Facial droop 12/15/2011   TIA (transient ischemic attack) 12/15/2011   Syncope 07/02/2011   Lower urinary tract infectious disease 07/02/2011   Normocytic anemia 07/02/2011   Hereditary and idiopathic peripheral neuropathy 04/02/2009   Essential hypertension 04/02/2009   Coronary artery disease involving native coronary artery of native heart without angina pectoris 04/02/2009   HIATAL HERNIA 04/02/2009   Irritable bowel syndrome 04/02/2009   HLD (hyperlipidemia) 05/30/2008   Class  2 obesity 05/30/2008   Cerebrovascular disease 05/30/2008   CVA (cerebral infarction) 05/30/2008   GERD 05/30/2008   Past Medical History:  Past Medical History:  Diagnosis Date   Atypical chest pain    a. non obstructive by cath in 2008 and 2010;  b. 02/2012 Myoview : non-ischemic, EF 57%   Cellulitis    a. left foot-> s/p L BKA   Chronic kidney disease    Constipation    CVA (cerebral infarction)    a.  Small right parietal noted incidentally 04/2007;  b. right sided embolic CVA 05/2012;  c. TEE 2/14:  LVH, EF 55-60%, mild LAE, no LAA clot, no PFO, no R->L shunt by echo contrast, oscillating density on AV likely Lambl's Excressence    Diabetes mellitus, type II (HCC)    Diabetic retinopathy (HCC)    NPDR w/edema OU   Diaphragmatic hernia without mention of obstruction or gangrene    Dry skin    Esophageal reflux    HTN (hypertension)    Hx of BKA (HCC)    Bilateral   Hyperlipidemia    Irritable bowel syndrome    Morbid obesity (HCC)    Obesity, unspecified    Pain in shoulder 06/28/2015   Peripheral neuropathy    Stroke [facility])    2013-'no residual'   Syncope and collapse    a. near-syncopal episode in November 2008;  b. 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   Tobacco abuse    Vertigo    Past Surgical History:  Past Surgical History:  Procedure Laterality Date   AMPUTATION  12/30/2011   Procedure: AMPUTATION RAY;  Surgeon: Norleen Armor, MD;  [facility]  OR;  Service: Orthopedics;  Laterality: Left;  LEFT FIRST RAY AMPUTATION   AMPUTATION  01/13/2012   Procedure: AMPUTATION BELOW KNEE;  Surgeon: Norleen Armor, MD;  Location: [facility] OR;  Service: Orthopedics;  Laterality: Left;   AMPUTATION  03/04/2012   Procedure: AMPUTATION BELOW KNEE;  Surgeon: Norleen Armor, MD;  Location: [facility] OR;  Service: Orthopedics;  Laterality: Left;  Revision of Left Below Knee Amputation   AMPUTATION Right 11/02/2013   Procedure: AMPUTATION BELOW KNEE;  Surgeon: Jerona Harden GAILS, MD;  Location: [facility] OR;   Service: Orthopedics;  Laterality: Right;  Right Below Knee Amputation   BIOPSY  03/29/2019   Procedure: BIOPSY;  Surgeon: San Sandor GAILS, DO;  Location: [facility] ENDOSCOPY;  Service: Gastroenterology;;  EGD and Colon   CARDIAC CATHETERIZATION     LAD 30%, circumflex 50%, OM 75%, RI 60% with small branch 80%, dominant RCA 60%, EF 45-50%   CHOLECYSTECTOMY     COLONOSCOPY     COLONOSCOPY WITH PROPOFOL  N/A 03/29/2019   Procedure: COLONOSCOPY WITH PROPOFOL ;  Surgeon: San Sandor GAILS, DO;  Location: [facility] ENDOSCOPY;  Service: Gastroenterology;  Laterality: N/A;   ESOPHAGOGASTRODUODENOSCOPY (EGD) WITH PROPOFOL  N/A 03/29/2019   Procedure: ESOPHAGOGASTRODUODENOSCOPY (EGD) WITH PROPOFOL ;  Surgeon: San Sandor GAILS, DO;  Location: [facility] ENDOSCOPY;  Service: Gastroenterology;  Laterality: N/A;   EYE SURGERY Right    cataract   I & D EXTREMITY  12/23/2011   Procedure: IRRIGATION AND DEBRIDEMENT EXTREMITY;  Surgeon: Elspeth JONELLE Her, MD;  Location[facility] OR;  Service: Orthopedics;  Laterality: Left;  Left Foot   I & D EXTREMITY  12/26/2011   Procedure: IRRIGATION AND DEBRIDEMENT EXTREMITY;  Surgeon: Norleen Armor, MD;  Location: [facility] OR;  Service: Orthopedics;  Laterality: Left;  LEFT FOOT I&D WITH POSSIBLE WOUND VAC APPLICATION, POSSIBLE LEFT FIRST RAY AMPUTATION   I & D EXTREMITY Right 10/29/2013   Procedure: IRRIGATION AND DEBRIDEMENT EXTREMITY;  Surgeon: Reyes JAYSON Billing, MD;  Location: [facility] OR;  Service: Orthopedics;  Laterality: Right;   Invasive Electrophysiologic Study  5/09   followed by insertion of an implantable loop recorder. S/p removal    IR FLUORO GUIDE CV LINE RIGHT  11/16/2023   IR US  GUIDE VASC ACCESS RIGHT  11/16/2023   Multiple Toe Surgeries     POLYPECTOMY  03/29/2019   Procedure: POLYPECTOMY;  Surgeon: San Sandor GAILS, DO;  Location: [facility] ENDOSCOPY;  Service: Gastroenterology;;   RIGHT/LEFT HEART CATH AND CORONARY ANGIOGRAPHY N/A 09/07/2023   Procedure: RIGHT/LEFT HEART CATH AND CORONARY ANGIOGRAPHY;   Surgeon: Anner Alm ORN, MD;  [facility] INVASIVE CV LAB;  Service: Cardiovascular;  Laterality: N/A;   TEE WITHOUT CARDIOVERSION N/A 07/07/2012   Procedure: TRANSESOPHAGEAL ECHOCARDIOGRAM (TEE);  Surgeon: Redell GORMAN Shallow, MD;  Location: [facility] ENDOSCOPY;  Service: Cardiovascular;  Laterality: N/A;   HPI:  [name] is a 68 yo female presenting to ED 6/25 with SOB after recent admission for NSTEMI (not a candidate for CABG or PCI). Admitted with sepsis and CAP. CT Chest shows extensive patchy and ground-glass opacity in both lungs with progression, compatible with worsening pulmonary edema and possible PNA. During HD cath placement 6/30, there was a sudden change to nonverbal and subsequent MRI showed R frontal CVA. Failed swallow screen 7/1 but unable to coordinate evaluation due to intermittent BiPAP use and HD. Previously seen by SLP in 2013 and 2014 with a functional appearing swallow and memory deficits. PMH includes SOB, NSTEMI, prior CVA, CKD 3A, bil BKA, HTN, DM, IBS, anemia,  and diabetic retinopathy   Assessment / Plan / Recommendation Clinical Impression  Pt is alert and attentive to SLP. She does not follow commands given Max multimodal cueing. She answered one yes/no question pertaining to her name but otherwise did not answer subsequent attempts. Phonation was not observed throughout the evaluation. Per chart review, pt is typically dependent for ADLs except for self-feeding, for which today she required total A. Suspect pt presents with acute cognitive-linguistic deficits and she may benefit from ongoing SLP intervention. Will continue following.    SLP Assessment  SLP Recommendation/Assessment: Patient needs continued Speech Language Pathology Services SLP Visit Diagnosis: Cognitive communication deficit (R41.841)     Assistance Recommended at Discharge  Frequent or constant Supervision/Assistance  Functional Status Assessment Patient has had a recent decline in their  functional status and demonstrates the ability to make significant improvements in function in a reasonable and predictable amount of time.  Frequency and Duration min 2x/week  2 weeks      SLP Evaluation Cognition  Overall Cognitive Status: History of cognitive impairments - at baseline Arousal/Alertness: Awake/alert Orientation Level: Oriented to person;Disoriented to place;Disoriented to time;Disoriented to situation Attention: Sustained Sustained Attention: Appears intact Awareness: Impaired Awareness Impairment: Intellectual impairment Problem Solving: Impaired Problem Solving Impairment: Functional basic       Comprehension  Auditory Comprehension Overall Auditory Comprehension: Impaired Yes/No Questions: Impaired Basic Biographical Questions: 51-75% accurate Basic Immediate Environment Questions: 0-24% accurate Commands: Impaired One Step Basic Commands: 0-24% accurate    Expression Expression Primary Mode of Expression: Verbal Verbal Expression Overall Verbal Expression: Impaired Initiation: Impaired   Oral / Motor  Oral Motor/Sensory Function Overall Oral Motor/Sensory Function: Within functional limits Motor Speech Overall Motor Speech: Other (comment) (UTA)            Damien Blumenthal, M.A., CCC-SLP Speech Language Pathology, Acute Rehabilitation Services  Secure Chat preferred [phone]  11/18/2023, 3:10 PM

## 2023-11-18 NOTE — Progress Notes (Signed)
 SLP Cancellation Note  Patient Details Name: [name] MRN: [id number] DOB: 03/05/1956   Cancelled treatment:       Reason Eval/Treat Not Completed: Patient at procedure or test/unavailable. Pt is off the floor for HD. SLP updated RN on how to contact this writer or SLP team when pt is back on floor so that we can complete her clinical swallow assessment and cognitive-linguistic assessment. Pt is NPO no source due to failing the [facility] Protocol on admission. Please reach out to our team once pt is available.    [name] 11/18/2023, 8:17 AM

## 2023-11-18 NOTE — Progress Notes (Signed)
   11/18/23 1205  Vitals  Temp 98.2 F (36.8 C)  Pulse Rate 77  Resp (!) 25  BP (!) 104/55  SpO2 95 %  Weight 106.8 kg  Type of Weight Post-Dialysis  Oxygen Therapy  O2 Flow Rate (L/min) 4 L/min  Post Treatment  Dialyzer Clearance Lightly streaked  Hemodialysis Intake (mL) 0 mL  Liters Processed 54  Fluid Removed (mL) 2000 mL  Tolerated HD Treatment Yes   Received patient in bed to unit.  Alert and oriented.  Informed consent signed and in chart.   TX duration:3HRS  Patient tolerated well.  Transported back to the room  Alert, without acute distress.  Hand-off given to patient's nurse.   Access used: [facility] Access issues: NONE  Total UF removed: 2L Medication(s) given: NONE    [name]

## 2023-11-18 NOTE — Progress Notes (Addendum)
 STROKE TEAM PROGRESS NOTE    SIGNIFICANT HOSPITAL EVENTS 6/25: Patient presented with shortness of breath and hypotension, found to have sepsis from community-acquired pneumonia 6/30: Temporary hemodialysis catheter placed and patient noted to be nonverbal with increased work of breathing post procedurally, MRI demonstrates small 5 mm stroke in the right frontal lobe  INTERIM HISTORY/SUBJECTIVE Patient just returned from dialysis.  She is sitting up in bed.  She is not speaking but able to follow midline and one-step commands.  She moves all 4 extremities well against gravity. EEG shows moderate diffuse encephalopathy.  No seizures OBJECTIVE  CBC    Component Value Date/Time   WBC 16.4 (H) 11/18/2023 0226   RBC 3.22 (L) 11/18/2023 0226   HGB 8.5 (L) 11/18/2023 0226   HGB 9.6 (L) 12/18/2021 1359   HCT 26.8 (L) 11/18/2023 0226   PLT 229 11/18/2023 0226   PLT 316 12/18/2021 1359   MCV 83.2 11/18/2023 0226   MCH 26.4 11/18/2023 0226   MCHC 31.7 11/18/2023 0226   RDW 19.2 (H) 11/18/2023 0226   LYMPHSABS 2.7 11/11/2023 1318   MONOABS 0.4 11/11/2023 1318   EOSABS 0.3 11/11/2023 1318   BASOSABS 0.1 11/11/2023 1318    BMET    Component Value Date/Time   NA 137 11/18/2023 0226   NA 143 08/20/2015 0000   K 4.2 11/18/2023 0226   CL 100 11/18/2023 0226   CO2 20 (L) 11/18/2023 0226   GLUCOSE 106 (H) 11/18/2023 0226   BUN 52 (H) 11/18/2023 0226   BUN 28 (A) 08/20/2015 0000   CREATININE 4.53 (H) 11/18/2023 0226   CREATININE 1.11 (H) 12/18/2021 1359   CALCIUM  7.3 (L) 11/18/2023 0226   GFRNONAA 10 (L) 11/18/2023 0226   GFRNONAA 54 (L) 12/18/2021 1359    IMAGING past 24 hours EEG adult Result Date: 11/17/2023 Shelton Arlin KIDD, MD     11/17/2023  5:49 PM Patient Name: [name] MRN: [id number] Epilepsy Attending: Arlin KIDD Shelton Referring Physician/Provider: everitt Clint Abbey Earle FORBES, NP Date: 11/17/2023 Duration: 25.36 mins Patient history: 20y F with ams. EEG to evaluate for seizure  Level of alertness: Awake AEDs during EEG study: None Technical aspects: This EEG study was done with scalp electrodes positioned according to the 10-20 International system of electrode placement. Electrical activity was reviewed with band pass filter of 1-70Hz , sensitivity of 7 uV/mm, display speed of 27mm/sec with a 60Hz  notched filter applied as appropriate. EEG data were recorded continuously and digitally stored.  Video monitoring was available and reviewed as appropriate. Description: EEG showed continuous generalized predominantly 5 to 6 Hz theta slowing admixed with intermittent 2-3hz  delta slowing. Hyperventilation and photic stimulation were not performed.   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 No seizures or epileptiform discharges were seen throughout the recording. Priyanka KIDD Shelton    Vitals:   11/18/23 1200 11/18/23 1205 11/18/23 1300 11/18/23 1335  BP: 108/88 (!) 104/55 101/66   Pulse: 78 77    Resp: (!) 24 (!) 25 (!) 24   Temp:  98.2 F (36.8 C) 98.4 F (36.9 C)   TempSrc:   Oral   SpO2: 90% 95%  98%  Weight:  106.8 kg    Height:         PHYSICAL EXAM General: Ill-appearing patient with bilateral BKA's in no acute distress CV: Regular rate and rhythm on monitor Respiratory: Slightly labored respirations on BiPAP GI: Abdomen soft and nontender   NEURO:  Mental Status: Patient is awake.  She is nonverbal but responds to her name and will   follow commands Speech/Language: No verbal output  Cranial Nerves:  II: PERRL.  III, IV, VI: Tracks examiner around the room VII: Face appears symmetrical with BiPAP mask on VIII: hearing intact to voice. XII: Noncooperative with tongue protrusion Motor: Able to move bilateral upper extremities with antigravity strength, some asterixis noted, moves bilateral lower extremities but does not lift them off the bed has bilateral asterixis at both wrist Tone:  is normal and bulk is normal Sensation- Intact to light touch bilaterally.  Coordination: Unable to perform Gait- deferred  Most Recent NIH  1a Level of Conscious.: 0 1b LOC Questions: 2 1c LOC Commands: 1 2 Best Gaze: 0 3 Visual: 0 4 Facial Palsy: 0 5a Motor Arm - left: 0 5b Motor Arm - Right: 0 6a Motor Leg - Left: 1 6b Motor Leg - Right: 1 17 Limb Ataxia: 0 8 Sensory: 0 9 Best Language: 3 10 Dysarthria: 1 11 Extinct. and Inatten.: 0 TOTAL: 9   ASSESSMENT/PLAN  [name] is a 68 y.o. female with history of PAD, bilateral BKA's, hypertension, hyperlipidemia, diabetes, retinopathy, obesity, recent non-STEMI who was originally admitted for sepsis secondary to pneumonia and had an AKI on CKD as well as CHF exacerbation was found to be nonverbal with increased respiratory distress after procedure to insert temporary dialysis catheter.  She was placed on BiPAP.  MRI found small right frontal lobe stroke, which is likely not the cause of her symptoms.  She was noted to be in A-fib on admission in the setting of sepsis.  Of note, she has had a 7-day course of cefepime , and this medication can cause altered mental status.  NIH on Admission 5  Acute Ischemic Infarct:  right frontal lobe stroke Etiology: Small vessel disease versus embolic given transient A-fib noted on EKG on 11/11/2023 CT head 7/1 known small acute infarct in right frontal lobe, no progressive mass effect or acute hemorrhage MRI 5 mm acute/subacute infarct in right frontal lobe TCD pending Carotid Doppler pending 2D Echo EF 20 to 25%, grade 2 diastolic dysfunction,, mild LVH, left atrium mildly dilated, cannot exclude small PFO LDL 61 HgbA1c 7.0 VTE prophylaxis -subcutaneous heparin  aspirin  81 mg daily prior to admission, now on aspirin  300 mg suppository daily and clopidogrel  75 mg daily for 3 weeks and then Plavix  alone. Therapy recommendations:  Pending Disposition: Pending   Atrial  fibrillation Patient had an episode of A-fib on admission in the setting of sepsis, resolved spontaneously and no need for anticoagulation  Hypertension Home meds: Hydralazine  25 mg daily Stable on the low end Blood Pressure Goal: BP less than 220/110   Hyperlipidemia Home meds: Atorvastatin  80 mg daily, resumed in hospital LDL 61, goal < 70 Continue statin at discharge  Diabetes type II Controlled Home meds: Insulin  lispro sliding scale, metformin  500 mg daily HgbA1c 7.0, goal < 7.0 CBGs SSI Recommend close follow-up with PCP for better DM control  Community-acquired pneumonia, sepsis and respiratory failure Patient developed increased work of breathing after procedure yesterday Continues on BiPAP 7-day course of cefepime  completed  AKI on CKD Patient with CKD stage IIIa developed AKI in the setting of vancomycin , contrast and diuretic use Temporary dialysis catheter placed 6/30 Hemodialysis session today  CHF exacerbation EF 20 to 25% Diuretic stopped in setting of renal failure Management per primary team  Dysphagia Patient has post-stroke dysphagia, SLP consulted  Diet   Diet NPO time specified   Advance diet as tolerated  Other Stroke Risk Factors Obesity, Body mass index is 38 kg/m., BMI >/= 30 associated with increased stroke risk, recommend weight loss, diet and exercise as appropriate  Coronary artery disease Congestive heart failure   Other Active Problems None  Hospital day # 7  Patient has speech difficulty likely related to encephalopathy which is multifactorial from her renal failure, sepsis respiratory failure unlikely related with tiny punctate left.  EEG shows moderate diffuse encephalopathy and no seizures.  Recommend aspirin  and Plavix  for 3 weeks followed by Plavix  alone.  Aggressive risk factor modification.  Mobilize out of bed.  Therapy consults.  No family available at the bedside for discussion.  Discussed with Dr. Jonel.      I  personally spent a total of 35 minutes in the care of the patient today including getting/reviewing separately obtained history, performing a medically appropriate exam/evaluation, counseling and educating, placing orders, referring and communicating with other health care professionals, documenting clinical information in the EHR, independently interpreting results, and coordinating care.             Stroke team will sign off.  Kindly call for questions Eather Popp, MD Medical Director [facility] Pager: [phone] 11/18/2023 2:54 PM   To contact Stroke Continuity provider, please refer to [email or web address]. After hours, contact General Neurology

## 2023-11-18 NOTE — Evaluation (Signed)
 Clinical/Bedside Swallow Evaluation Patient Details  Name: [name] MRN: [id number] Date of Birth: [birth year]  Today's Date: 11/18/2023 Time: SLP Start Time (ACUTE ONLY): 1409 SLP Stop Time (ACUTE ONLY): 1425 SLP Time Calculation (min) (ACUTE ONLY): 16 min  Past Medical History:  Past Medical History:  Diagnosis Date   Atypical chest pain    a. non obstructive by cath in 2008 and 2010;  b. 02/2012 Myoview : non-ischemic, EF 57%   Cellulitis    a. left foot-> s/p L BKA   Chronic kidney disease    Constipation    CVA (cerebral infarction)    a.  Small right parietal noted incidentally 04/2007;  b. right sided embolic CVA 05/2012;  c. TEE 2/14:  LVH, EF 55-60%, mild LAE, no LAA clot, no PFO, no R->L shunt by echo contrast, oscillating density on AV likely Lambl's Excressence    Diabetes mellitus, type II (HCC)    Diabetic retinopathy (HCC)    NPDR w/edema OU   Diaphragmatic hernia without mention of obstruction or gangrene    Dry skin    Esophageal reflux    HTN (hypertension)    Hx of BKA (HCC)    Bilateral   Hyperlipidemia    Irritable bowel syndrome    Morbid obesity (HCC)    Obesity, unspecified    Pain in shoulder 06/28/2015   Peripheral neuropathy    Stroke [facility])    2013-'no residual'   Syncope and collapse    a. near-syncopal episode in November 2008;  b. 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   Tobacco abuse    Vertigo    Past Surgical History:  Past Surgical History:  Procedure Laterality Date   AMPUTATION  12/30/2011   Procedure: AMPUTATION RAY;  Surgeon: Norleen Armor, MD;  [facility] OR;  Service: Orthopedics;  Laterality: Left;  LEFT FIRST RAY AMPUTATION   AMPUTATION  01/13/2012   Procedure: AMPUTATION BELOW KNEE;  Surgeon: Norleen Armor, MD;  Location: [facility] OR;  Service: Orthopedics;  Laterality: Left;   AMPUTATION  03/04/2012   Procedure: AMPUTATION BELOW KNEE;  Surgeon: Norleen Armor, MD;  Location: [facility] OR;  Service: Orthopedics;  Laterality: Left;  Revision of  Left Below Knee Amputation   AMPUTATION Right 11/02/2013   Procedure: AMPUTATION BELOW KNEE;  Surgeon: Jerona Harden GAILS, MD;  Location: [facility] OR;  Service: Orthopedics;  Laterality: Right;  Right Below Knee Amputation   BIOPSY  03/29/2019   Procedure: BIOPSY;  Surgeon: San Sandor GAILS, DO;  Location: [facility] ENDOSCOPY;  Service: Gastroenterology;;  EGD and Colon   CARDIAC CATHETERIZATION     LAD 30%, circumflex 50%, OM 75%, RI 60% with small branch 80%, dominant RCA 60%, EF 45-50%   CHOLECYSTECTOMY     COLONOSCOPY     COLONOSCOPY WITH PROPOFOL  N/A 03/29/2019   Procedure: COLONOSCOPY WITH PROPOFOL ;  Surgeon: San Sandor GAILS, DO;  Location: [facility] ENDOSCOPY;  Service: Gastroenterology;  Laterality: N/A;   ESOPHAGOGASTRODUODENOSCOPY (EGD) WITH PROPOFOL  N/A 03/29/2019   Procedure: ESOPHAGOGASTRODUODENOSCOPY (EGD) WITH PROPOFOL ;  Surgeon: San Sandor GAILS, DO;  Location: [facility] ENDOSCOPY;  Service: Gastroenterology;  Laterality: N/A;   EYE SURGERY Right    cataract   I & D EXTREMITY  12/23/2011   Procedure: IRRIGATION AND DEBRIDEMENT EXTREMITY;  Surgeon: Elspeth JONELLE Her, MD;  Location: [facility] OR;  Service: Orthopedics;  Laterality: Left;  Left Foot   I & D EXTREMITY  12/26/2011   Procedure: IRRIGATION AND DEBRIDEMENT EXTREMITY;  Surgeon: Norleen Armor, MD;  Location: [facility] OR;  Service:  Orthopedics;  Laterality: Left;  LEFT FOOT I&D WITH POSSIBLE WOUND VAC APPLICATION, POSSIBLE LEFT FIRST RAY AMPUTATION   I & D EXTREMITY Right 10/29/2013   Procedure: IRRIGATION AND DEBRIDEMENT EXTREMITY;  Surgeon: Reyes JAYSON Billing, MD;  Location: [facility] OR;  Service: Orthopedics;  Laterality: Right;   Invasive Electrophysiologic Study  5/09   followed by insertion of an implantable loop recorder. S/p removal    IR FLUORO GUIDE CV LINE RIGHT  11/16/2023   IR US  GUIDE VASC ACCESS RIGHT  11/16/2023   Multiple Toe Surgeries     POLYPECTOMY  03/29/2019   Procedure: POLYPECTOMY;  Surgeon: San Sandor GAILS, DO;  Location: [facility] ENDOSCOPY;  Service:  Gastroenterology;;   RIGHT/LEFT HEART CATH AND CORONARY ANGIOGRAPHY N/A 09/07/2023   Procedure: RIGHT/LEFT HEART CATH AND CORONARY ANGIOGRAPHY;  Surgeon: Anner Alm ORN, MD;  Location: [facility] INVASIVE CV LAB;  Service: Cardiovascular;  Laterality: N/A;   TEE WITHOUT CARDIOVERSION N/A 07/07/2012   Procedure: TRANSESOPHAGEAL ECHOCARDIOGRAM (TEE);  Surgeon: Redell GORMAN Shallow, MD;  Location: [facility] ENDOSCOPY;  Service: Cardiovascular;  Laterality: N/A;   HPI:  [name] is a 68 yo female presenting to ED 6/25 with SOB after recent admission for NSTEMI (not a candidate for CABG or PCI). Admitted with sepsis and CAP. CT Chest shows extensive patchy and ground-glass opacity in both lungs with progression, compatible with worsening pulmonary edema and possible PNA. During HD cath placement 6/30, there was a sudden change to nonverbal and subsequent MRI showed R frontal CVA. Failed swallow screen 7/1 but unable to coordinate evaluation due to intermittent BiPAP use and HD. Previously seen by SLP in 2013 and 2014 with a functional appearing swallow and memory deficits. PMH includes SOB, NSTEMI, prior CVA, CKD 3A, bil BKA, HTN, DM, IBS, anemia, and diabetic retinopathy    Assessment / Plan / Recommendation  Clinical Impression  Pt is alert and attentive to POs but does not follow commands. She has a congested-sounding cough before PO trials that continued throughout the session. Coughing immediately follows trials of thin liquids and is more delayed after trials of puree. Persistent coughing and the effort required to coordinate oral transit is suspected to contribute to worsening tachypnea throughout trials. Given concern for PNA at onset of admission and now an acute CVA, recommend proceeding with an MBS prior to diet initiation. Pending results, recommend NPO status be maintained with the exception of ice chips and meds crushed in puree. SLP will f/u. SLP Visit Diagnosis: Dysphagia, unspecified (R13.10)     Aspiration Risk  Mild aspiration risk    Diet Recommendation NPO except meds;Ice chips PRN after oral care    Medication Administration: Crushed with puree    Other  Recommendations Oral Care Recommendations: Oral care QID;Oral care prior to ice chip/H20     Assistance Recommended at Discharge    Functional Status Assessment Patient has had a recent decline in their functional status and demonstrates the ability to make significant improvements in function in a reasonable and predictable amount of time.  Frequency and Duration min 2x/week  2 weeks       Prognosis Prognosis for improved oropharyngeal function: Fair Barriers to Reach Goals: Cognitive deficits;Severity of deficits      Swallow Study   General HPI: GLENDON FISER is a 68 yo female presenting to ED 6/25 with SOB after recent admission for NSTEMI (not a candidate for CABG or PCI). Admitted with sepsis and CAP. CT Chest shows extensive patchy and ground-glass opacity in  both lungs with progression, compatible with worsening pulmonary edema and possible PNA. During HD cath placement 6/30, there was a sudden change to nonverbal and subsequent MRI showed R frontal CVA. Failed swallow screen 7/1 but unable to coordinate evaluation due to intermittent BiPAP use and HD. Previously seen by SLP in 2013 and 2014 with a functional appearing swallow and memory deficits. PMH includes SOB, NSTEMI, prior CVA, CKD 3A, bil BKA, HTN, DM, IBS, anemia, and diabetic retinopathy Type of Study: Bedside Swallow Evaluation Previous Swallow Assessment: see HPI Diet Prior to this Study: NPO Temperature Spikes Noted: No Respiratory Status: Nasal cannula History of Recent Intubation: No Behavior/Cognition: Alert;Cooperative;Requires cueing Oral Cavity Assessment: Within Functional Limits Oral Care Completed by SLP: No Oral Cavity - Dentition: Adequate natural dentition Vision: Functional for self-feeding Self-Feeding Abilities: Total  assist Patient Positioning: Upright in bed Baseline Vocal Quality: Not observed Volitional Cough: Congested Volitional Swallow: Unable to elicit    Oral/Motor/Sensory Function Overall Oral Motor/Sensory Function: Within functional limits   Ice Chips Ice chips: Within functional limits Presentation: Spoon   Thin Liquid Thin Liquid: Impaired Presentation: Straw Pharyngeal  Phase Impairments: Multiple swallows;Cough - Immediate    Nectar Thick Nectar Thick Liquid: Not tested   Honey Thick Honey Thick Liquid: Not tested   Puree Puree: Impaired Presentation: Spoon Pharyngeal Phase Impairments: Cough - Delayed   Solid     Solid: Not tested      Damien Blumenthal, M.A., CCC-SLP Speech Language Pathology, Acute Rehabilitation Services  Secure Chat preferred [phone]  11/18/2023,3:01 PM

## 2023-11-18 NOTE — Care Management Important Message (Signed)
 Important Message  Patient Details  Name: [name] MRN: [id number] Date of Birth: Apr 05, 1956   Important Message Given:  Yes - Medicare IM     Vonzell Arrie Sharps 11/18/2023, 3:17 PM

## 2023-11-18 NOTE — Progress Notes (Signed)
 [name] is an 68 y.o. female w/ DM with retinopathy and peripheral neuropathy, HTN, PAD with B/L AKA, HLD, CVA, CKD 3A as below who presented to ED sent from [facility] due to SOB 11/11/2023.  O2 sats were in the 70s, heart rate 130s.  Patient was initially hypotensive.  Recently was admitted in April for NSTEMI.  Admitted for severe sepsis due to CAP. Also felt to have acute on chronic syst/ diast CHF (EF25%). Creat bumped up to 2.7. Then on 4th hospital day IV vanc was stopped, IV cefepime  continued.  Renal US  was negative, IVFs were cont'd and midodrine . Not a CABG candidate.   Assessment/Plan: AKI on CKD 3b: b/l creat is 1.2- 1.7 from April - June 2025, eGFR 32- 48 ml/min. Creat here was 1.5 on admit 6/25. SP IV contrast on 6/25 w/ CTA chest. Pt was hypotensive on 6/26 and still had soft BP's on 6/27, but now is better.  UOP has been marginal (200-300 cc/d). UA shows wbc's. Urine lytes c/w ATN. Renal US  shows no obstruction. AKI likely ATN d/t contrast + hypotension. Making small amts of urine. Maintain supportive care.  Minimal UOP; renal function and metabolic acidosis continued to worsen ->  - Discussion with niece [name] (on 6/30) who has power of attorney; appreciate VIR RIJ temp cath (placed 6/30)  Tolerated 1st HD 7/1 w/ 2L UF and seen on HD #2 today Blood pressure 112/64 3K bath 2 L UF goal Patient respiratory rate markedly improved and appears much more comfortable today but has a blank stare and not really answering questions appropriately  Will hold on HD Thur; will assess daily and follow closely with you  Volume: vol excess on exam, very urine output is likely significantly volume overloaded.  Respiratory rate was also high requiring BiPAP on 6/30 but much more comfortable after dialysis on 7/1 with 2 L net UF. Debility: not sure what her capacity is DM2: per pmd HTN: bp's soft, getting midodrine  at 2.5 tid. Continue.  CAP: on Cefepime   Subjective: Confused, not  really answering questions appropriately    Chemistry and CBC: Creatinine  Date/Time Value Ref Range Status  12/18/2021 01:59 PM 1.11 (H) 0.44 - 1.00 mg/dL Final  98/69/7976 97:67 PM 1.16 0.60 - 1.20 mg/dL Final  92/70/7977 89:78 AM 1.31 (H) 0.44 - 1.00 mg/dL Final  88/82/7979 89:42 AM 1.27 (H) 0.44 - 1.00 mg/dL Final  95/96/7982 87:99 AM 1.2 (A) 0.5 - 1.1 mg/dL Final  97/84/7982 87:99 AM 1.0 0.5 - 1.1 mg/dL Final  91/87/7983 87:99 AM 0.9 0.5 - 1.1 mg/dL Final   Creatinine, Ser  Date/Time Value Ref Range Status  11/18/2023 02:26 AM 4.53 (H) 0.44 - 1.00 mg/dL Final  92/98/7974 97:75 AM 5.94 (H) 0.44 - 1.00 mg/dL Final  93/69/7974 96:92 AM 4.94 (H) 0.44 - 1.00 mg/dL Final  93/70/7974 95:67 AM 4.38 (H) 0.44 - 1.00 mg/dL Final  93/71/7974 96:87 AM 3.88 (H) 0.44 - 1.00 mg/dL Final  93/72/7974 88:51 AM 3.20 (H) 0.44 - 1.00 mg/dL Final  93/72/7974 96:97 AM 2.76 (H) 0.44 - 1.00 mg/dL Final    Comment:    DELTA CHECK NOTED  11/12/2023 05:00 AM 1.73 (H) 0.44 - 1.00 mg/dL Final  93/74/7974 98:81 PM 1.51 (H) 0.44 - 1.00 mg/dL Final  95/69/7974 94:99 AM 1.94 (H) 0.44 - 1.00 mg/dL Final  95/70/7974 90:99 AM 2.10 (H) 0.44 - 1.00 mg/dL Final  95/71/7974 94:99 AM 2.29 (H) 0.44 - 1.00 mg/dL Final  95/72/7974 95:69  AM 2.21 (H) 0.44 - 1.00 mg/dL Final  95/73/7974 96:40 AM 2.62 (H) 0.44 - 1.00 mg/dL Final  95/74/7974 96:82 AM 2.70 (H) 0.44 - 1.00 mg/dL Final  95/75/7974 95:84 AM 2.52 (H) 0.44 - 1.00 mg/dL Final  95/76/7974 95:61 AM 2.25 (H) 0.44 - 1.00 mg/dL Final  95/77/7974 94:59 PM 2.14 (H) 0.44 - 1.00 mg/dL Final  95/77/7974 96:74 AM 1.76 (H) 0.44 - 1.00 mg/dL Final  95/78/7974 97:87 AM 1.50 (H) 0.44 - 1.00 mg/dL Final  95/79/7974 97:57 PM 1.23 (H) 0.44 - 1.00 mg/dL Final  98/69/7979 98:43 PM 1.04 (H) 0.44 - 1.00 mg/dL Final  87/73/7980 88:85 AM 1.14 (H) 0.44 - 1.00 mg/dL Final  91/85/7983 93:54 PM 0.88 0.44 - 1.00 mg/dL Final  93/80/7984 92:66 AM 0.62 0.50 - 1.10 mg/dL Final  93/81/7984  90:89 AM 0.58 0.50 - 1.10 mg/dL Final  93/82/7984 94:64 AM 0.62 0.50 - 1.10 mg/dL Final  93/83/7984 93:74 AM 0.66 0.50 - 1.10 mg/dL Final  93/84/7984 93:69 AM 0.69 0.50 - 1.10 mg/dL Final  93/85/7984 93:94 AM 0.80 0.50 - 1.10 mg/dL Final  93/87/7984 90:67 PM 0.72 0.50 - 1.10 mg/dL Final  94/77/7984 97:83 PM 0.95 0.50 - 1.10 mg/dL Final  94/84/7984 94:54 PM 1.03 0.50 - 1.10 mg/dL Final  87/96/7985 87:54 AM 0.71 0.50 - 1.10 mg/dL Final  90/95/7985 94:53 AM 0.75 0.50 - 1.10 mg/dL Final  91/70/7985 94:89 AM 0.83 0.50 - 1.10 mg/dL Final  91/71/7985 93:92 PM 0.73 0.50 - 1.10 mg/dL Final  91/72/7985 94:44 AM 0.71 0.50 - 1.10 mg/dL Final  91/74/7985 90:57 AM 0.61 0.50 - 1.10 mg/dL Final  91/75/7985 89:77 PM 0.67 0.50 - 1.10 mg/dL Final  95/77/7985 98:67 PM 0.83 0.50 - 1.10 mg/dL Final  97/88/7985 97:41 PM 0.9 0.4 - 1.2 mg/dL Final  97/93/7985 94:57 PM 0.67 0.50 - 1.10 mg/dL Final  97/93/7985 98:68 PM 1.00 0.50 - 1.10 mg/dL Final  97/93/7985 98:73 PM 0.62 0.50 - 1.10 mg/dL Final  98/79/7985 92:45 AM 0.82 0.50 - 1.10 mg/dL Final  98/91/7985 87:87 PM 0.70 0.50 - 1.10 mg/dL Final  89/83/7986 87:51 PM 0.65 0.50 - 1.10 mg/dL Final  90/74/7986 89:54 AM 0.87 0.50 - 1.10 mg/dL Final  90/76/7986 90:84 AM 0.96 0.50 - 1.10 mg/dL Final  90/84/7986 90:85 PM 0.99 0.50 - 1.10 mg/dL Final  90/97/7986 94:99 AM 0.89 0.50 - 1.10 mg/dL Final   Recent Labs  Lab 11/12/23 0500 11/13/23 0302 11/13/23 1148 11/14/23 0312 11/15/23 0432 11/16/23 0307 11/17/23 0224 11/18/23 0226  NA 138 137  --  135 135 136 138 137  K 3.8 3.9  --  4.2 4.3 4.5 4.5 4.2  CL 106 103  --  102 104 105 104 100  CO2 20* 16*  --  17* 13* 13* 16* 20*  GLUCOSE 138* 195*  --  199* 225* 205* 240* 106*  BUN 30* 41*  --  49* 62* 70* 80* 52*  CREATININE 1.73* 2.76* 3.20* 3.88* 4.38* 4.94* 5.94* 4.53*  CALCIUM  7.7* 7.7*  --  7.3* 7.3* 7.6* 7.3* 7.3*  PHOS  --   --   --   --   --  5.8* 6.4* 4.9*   Recent Labs  Lab 11/11/23 1318  11/11/23 2031 11/15/23 1527 11/16/23 0307 11/17/23 0224 11/18/23 0226  WBC 24.4*   < > 13.3* 16.4* 14.4* 16.4*  NEUTROABS 20.6*  --   --   --   --   --   HGB 12.3   < >  9.8* 9.7* 8.6* 8.5*  HCT 40.8   < > 31.9* 32.9* 28.9* 26.8*  MCV 86.4   < > 84.6 86.6 88.9 83.2  PLT 327   < > 218 220 201 229   < > = values in this interval not displayed.   Liver Function Tests: Recent Labs  Lab 11/12/23 0500 11/13/23 0302 11/16/23 0307 11/17/23 0224 11/18/23 0226  AST 39 32  --   --   --   ALT 16 21  --   --   --   ALKPHOS 103 100  --   --   --   BILITOT 0.6 0.8  --   --   --   PROT 6.4* 6.1*  --   --   --   ALBUMIN  2.6* 2.4* 2.5* 2.3* 2.4*   No results for input(s): LIPASE, AMYLASE in the last 168 hours. No results for input(s): AMMONIA in the last 168 hours. Cardiac Enzymes: No results for input(s): CKTOTAL, CKMB, CKMBINDEX, TROPONINI in the last 168 hours. Iron  Studies:  No results for input(s): IRON , TIBC, TRANSFERRIN, FERRITIN in the last 72 hours.  PT/INR: @LABRCNTIP (inr:5)  Xrays/Other Studies: ) Results for orders placed or performed during the hospital encounter of 11/11/23 (from the past 48 hours)  Glucose, capillary     Status: Abnormal   Collection Time: 11/16/23 11:19 AM  Result Value Ref Range   Glucose-Capillary 251 (H) 70 - 99 mg/dL    Comment: Glucose reference range applies only to samples taken after fasting for at least 8 hours.  Glucose, capillary     Status: Abnormal   Collection Time: 11/16/23  3:54 PM  Result Value Ref Range   Glucose-Capillary 302 (H) 70 - 99 mg/dL    Comment: Glucose reference range applies only to samples taken after fasting for at least 8 hours.  Glucose, capillary     Status: Abnormal   Collection Time: 11/16/23  8:53 PM  Result Value Ref Range   Glucose-Capillary 297 (H) 70 - 99 mg/dL    Comment: Glucose reference range applies only to samples taken after fasting for at least 8 hours.  Blood gas, arterial      Status: Abnormal   Collection Time: 11/17/23  1:00 AM  Result Value Ref Range   pH, Arterial 7.27 (L) 7.35 - 7.45   pCO2 arterial 32 32 - 48 mmHg   pO2, Arterial 108 83 - 108 mmHg   Bicarbonate 14.7 (L) 20.0 - 28.0 mmol/L   Acid-base deficit 11.0 (H) 0.0 - 2.0 mmol/L   O2 Saturation 98.3 %   Patient temperature 36.9    Collection site LEFT RADIAL    Drawn by [id number]    Allens test (pass/fail) PASS PASS    Comment: Performed at [facility], [address], [place], [place] [address]  CBC     Status: Abnormal   Collection Time: 11/17/23  2:24 AM  Result Value Ref Range   WBC 14.4 (H) 4.0 - 10.5 K/uL   RBC 3.25 (L) 3.87 - 5.11 MIL/uL   Hemoglobin 8.6 (L) 12.0 - 15.0 g/dL   HCT 71.0 (L) 63.9 - 53.9 %   MCV 88.9 80.0 - 100.0 fL   MCH 26.5 26.0 - 34.0 pg   MCHC 29.8 (L) 30.0 - 36.0 g/dL   RDW 80.3 (H) 88.4 - 84.4 %   Platelets 201 150 - 400 K/uL   nRBC 3.0 (H) 0.0 - 0.2 %    Comment: Performed at [facility], [address], [place], [place] [address]  Renal function panel     Status: Abnormal   Collection Time: 11/17/23  2:24 AM  Result Value Ref Range   Sodium 138 135 - 145 mmol/L   Potassium 4.5 3.5 - 5.1 mmol/L   Chloride 104 98 - 111 mmol/L   CO2 16 (L) 22 - 32 mmol/L   Glucose, Bld 240 (H) 70 - 99 mg/dL    Comment: Glucose reference range applies only to samples taken after fasting for at least 8 hours.   BUN 80 (H) 8 - 23 mg/dL   Creatinine, Ser 4.05 (H) 0.44 - 1.00 mg/dL   Calcium  7.3 (L) 8.9 - 10.3 mg/dL   Phosphorus 6.4 (H) 2.5 - 4.6 mg/dL   Albumin  2.3 (L) 3.5 - 5.0 g/dL   GFR, Estimated 7 (L) >60 mL/min    Comment: (NOTE) Calculated using the CKD-EPI Creatinine Equation (2021)    Anion gap 18 (H) 5 - 15    Comment: Performed at [facility], [address], [place], [place] [address]  Lipid panel     Status: Abnormal   Collection Time: 11/17/23  2:24 AM  Result Value Ref Range   Cholesterol 80 0 - 200 mg/dL   Triglycerides 84 <849 mg/dL    HDL 29 (L) >59 mg/dL   Total CHOL/HDL Ratio 2.8 RATIO   VLDL 17 0 - 40 mg/dL   LDL Cholesterol 34 0 - 99 mg/dL    Comment:        Total Cholesterol/HDL:CHD Risk Coronary Heart Disease Risk Table                     Men   Women  1/2 Average Risk   3.4   3.3  Average Risk       5.0   4.4  2 X Average Risk   9.6   7.1  3 X Average Risk  23.4   11.0        Use the calculated Patient Ratio above and the CHD Risk Table to determine the patient's CHD Risk.        ATP III CLASSIFICATION (LDL):  <100     mg/dL   Optimal  899-870  mg/dL   Near or Above                    Optimal  130-159  mg/dL   Borderline  839-810  mg/dL   High  >809     mg/dL   Very High Performed at [facility], [address], [place], [place] [address]   Glucose, capillary     Status: Abnormal   Collection Time: 11/17/23  5:57 AM  Result Value Ref Range   Glucose-Capillary 237 (H) 70 - 99 mg/dL    Comment: Glucose reference range applies only to samples taken after fasting for at least 8 hours.  Hemoglobin A1c     Status: Abnormal   Collection Time: 11/17/23  8:30 AM  Result Value Ref Range   Hgb A1c MFr Bld 7.0 (H) 4.8 - 5.6 %    Comment: (NOTE) Diagnosis of Diabetes The following HbA1c ranges recommended by the American Diabetes Association (ADA) may be used as an aid in the diagnosis of diabetes mellitus.  Hemoglobin             Suggested A1C NGSP%  Diagnosis  <5.7                   Non Diabetic  5.7-6.4                Pre-Diabetic  >6.4                   Diabetic  <7.0                   Glycemic control for                       adults with diabetes.     Mean Plasma Glucose 154.2 mg/dL    Comment: Performed at [facility], [address] [place], [place] [address]  Glucose, capillary     Status: Abnormal   Collection Time: 11/17/23  3:47 PM  Result Value Ref Range   Glucose-Capillary 139 (H) 70 - 99 mg/dL    Comment: Glucose reference range applies only to  samples taken after fasting for at least 8 hours.  Glucose, capillary     Status: Abnormal   Collection Time: 11/17/23  8:33 PM  Result Value Ref Range   Glucose-Capillary 114 (H) 70 - 99 mg/dL    Comment: Glucose reference range applies only to samples taken after fasting for at least 8 hours.  Glucose, capillary     Status: Abnormal   Collection Time: 11/18/23 12:30 AM  Result Value Ref Range   Glucose-Capillary 104 (H) 70 - 99 mg/dL    Comment: Glucose reference range applies only to samples taken after fasting for at least 8 hours.  Glucose, capillary     Status: Abnormal   Collection Time: 11/18/23  1:00 AM  Result Value Ref Range   Glucose-Capillary 109 (H) 70 - 99 mg/dL    Comment: Glucose reference range applies only to samples taken after fasting for at least 8 hours.  CBC     Status: Abnormal   Collection Time: 11/18/23  2:26 AM  Result Value Ref Range   WBC 16.4 (H) 4.0 - 10.5 K/uL   RBC 3.22 (L) 3.87 - 5.11 MIL/uL   Hemoglobin 8.5 (L) 12.0 - 15.0 g/dL   HCT 73.1 (L) 63.9 - 53.9 %   MCV 83.2 80.0 - 100.0 fL   MCH 26.4 26.0 - 34.0 pg   MCHC 31.7 30.0 - 36.0 g/dL   RDW 80.7 (H) 88.4 - 84.4 %   Platelets 229 150 - 400 K/uL   nRBC 2.8 (H) 0.0 - 0.2 %    Comment: Performed at [facility], [address] [place], [place] [address]  Renal function panel     Status: Abnormal   Collection Time: 11/18/23  2:26 AM  Result Value Ref Range   Sodium 137 135 - 145 mmol/L   Potassium 4.2 3.5 - 5.1 mmol/L   Chloride 100 98 - 111 mmol/L   CO2 20 (L) 22 - 32 mmol/L   Glucose, Bld 106 (H) 70 - 99 mg/dL    Comment: Glucose reference range applies only to samples taken after fasting for at least 8 hours.   BUN 52 (H) 8 - 23 mg/dL   Creatinine, Ser 5.46 (H) 0.44 - 1.00 mg/dL   Calcium  7.3 (L) 8.9 - 10.3 mg/dL   Phosphorus 4.9 (H) 2.5 - 4.6 mg/dL   Albumin  2.4 (L) 3.5 - 5.0 g/dL   GFR, Estimated 10 (L) >60 mL/min  Comment: (NOTE) Calculated using the CKD-EPI Creatinine  Equation (2021)    Anion gap 17 (H) 5 - 15    Comment: Performed at [facility], [address] [place], [place] [address]  Glucose, capillary     Status: Abnormal   Collection Time: 11/18/23  4:17 AM  Result Value Ref Range   Glucose-Capillary 120 (H) 70 - 99 mg/dL    Comment: Glucose reference range applies only to samples taken after fasting for at least 8 hours.  Glucose, capillary     Status: Abnormal   Collection Time: 11/18/23  6:12 AM  Result Value Ref Range   Glucose-Capillary 127 (H) 70 - 99 mg/dL    Comment: Glucose reference range applies only to samples taken after fasting for at least 8 hours.   EEG adult Result Date: 11/17/2023 Shelton Arlin KIDD, MD     11/17/2023  5:49 PM Patient Name: AMANDY CHUBBUCK MRN: [id number] Epilepsy Attending: Arlin KIDD Shelton Referring Physician/Provider: everitt Clint Abbey Earle FORBES, NP Date: 11/17/2023 Duration: 25.36 mins Patient history: 63y F with ams. EEG to evaluate for seizure Level of alertness: Awake AEDs during EEG study: None Technical aspects: This EEG study was done with scalp electrodes positioned according to the 10-20 International system of electrode placement. Electrical activity was reviewed with band pass filter of 1-70Hz , sensitivity of 7 uV/mm, display speed of 80mm/sec with a 60Hz  notched filter applied as appropriate. EEG data were recorded continuously and digitally stored.  Video monitoring was available and reviewed as appropriate. Description: EEG showed continuous generalized predominantly 5 to 6 Hz theta slowing admixed with intermittent 2-3hz  delta slowing. Hyperventilation and photic stimulation were not performed.   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 No seizures or epileptiform discharges were seen throughout the recording. Priyanka KIDD Shelton   CT HEAD WO CONTRAST ( ) Result Date: 11/17/2023 CLINICAL DATA:  Mental status change, persistent or  worsening EXAM: CT HEAD WITHOUT CONTRAST TECHNIQUE: Contiguous axial images were obtained from the base of the skull through the vertex without intravenous contrast. RADIATION DOSE REDUCTION: This exam was performed according to the departmental dose-optimization program which includes automated exposure control, adjustment of the mA and/or kV according to patient size and/or use of iterative reconstruction technique. COMPARISON:  MRI head from earlier today. FINDINGS: Brain: Known acute infarct in the right frontal lobe better characterized on recent MRI. No progressive mass effect or acute hemorrhage. No midline shift. No hydrocephalus. Patchy white matter disease, compatible with chronic microvascular ischemic disease. Small remote bilateral basal ganglia lacunar infarcts. Small remote left cerebellar infarct. Cerebral atrophy. Vascular: Calcific atherosclerosis. Skull: No acute fracture. Sinuses/Orbits: Clear sinuses.  No acute orbital findings. Other: No mastoid effusions. IMPRESSION: Known small acute infarct in the right frontal lobe better characterized on recent MRI. No progressive mass effect or acute hemorrhage. Electronically Signed   By: Gilmore GORMAN Molt M.D.   On: 11/17/2023 01:33   MR BRAIN WO CONTRAST Result Date: 11/17/2023 EXAM DESCRIPTION: MR BRAIN WO CONTRAST CLINICAL HISTORY: Mental status change, unknown cause COMPARISON: None available TECHNIQUE: Non contrast MRI of the brain is performed according to our usual protocol including multiplanar multi sequence technique. FINDINGS: There is a 5 mm acute/subacute infarct in the right frontal lobe seen on diffusion image 82. No other acute intracranial hemorrhage, mass, edema, or hydrocephalus. Mild to moderate cortical atrophy. Very small chronic infarcts left cerebellum. Small bilateral chronic basal ganglia lacunar infarcts. Mild to moderate  white matter disease suggesting chronic small vessel ischemic change. The vascular flow voids are  unremarkable. No significant sinus disease. IMPRESSION: 5 mm acute/subacute infarct in the right frontal lobe. Age-related change. Very small chronic infarcts in the left cerebellum. Electronically signed by: Reyes Frees MD 11/17/2023 12:35 AM EDT RP Workstation: [id number]   DG CHEST PORT 1 VIEW Result Date: 11/16/2023 CLINICAL DATA:  Rales. EXAM: PORTABLE CHEST 1 VIEW COMPARISON:  Radiograph 11/12/2023 FINDINGS: Right-sided dialysis catheter with tip overlying the right atrium. No pneumothorax. Cardiomegaly is unchanged. Mediastinal contours are stable. Improving vascular congestion. Suspected small pleural effusions. IMPRESSION: 1. Improving vascular congestion. Suspected small pleural effusions. 2. Unchanged cardiomegaly. 3. Right dialysis catheter tip overlies the right atrium. Electronically Signed   By: Andrea Gasman M.D.   On: 11/16/2023 21:42   IR Fluoro Guide CV Line Right Result Date: 11/16/2023 CLINICAL DATA:  Renal failure and need for non tunneled dialysis catheter for immediate hemodialysis. EXAM: NON-TUNNELED CENTRAL VENOUS HEMODIALYSIS CATHETER PLACEMENT WITH ULTRASOUND AND FLUOROSCOPIC GUIDANCE FLUOROSCOPY: Radiation Exposure Index: 17.3 mGy Kerma PROCEDURE: The procedure, risks, benefits, and alternatives were explained to the patient. Questions regarding the procedure were encouraged and answered. The patient understands and consents to the procedure. A time out was performed prior to initiating the procedure. Ultrasound was used to confirm patency of the right internal jugular vein. An ultrasound image was saved and recorded. The right neck and chest were prepped with chlorhexidine  in a sterile fashion, and a sterile drape was applied covering the operative field. Maximum barrier sterile technique with sterile gowns and gloves were used for the procedure. Local anesthesia was provided with 1% lidocaine . After creating a small venotomy incision, a 21 gauge needle was advanced into  the right internal jugular vein under direct, real-time ultrasound guidance. Ultrasound image documentation was performed. After securing guidewire access,the venotomy was dilated over a guide wire. A 16 cm, 13 Fr, triple lumen non-tunneled dialysis catheter was advanced over the wire. Final catheter positioning was confirmed and documented with a fluoroscopic spot image. The catheter was aspirated, flushed with saline, and injected with appropriate volume heparin  dwells. The catheter exit site was secured with 0-Prolene retention sutures. COMPLICATIONS: None.  No pneumothorax. FINDINGS: After catheter placement, the tip lies at the cavoatrial junction. The catheter aspirates normally and is ready for immediate use. IMPRESSION: Placement of non-tunneled central venous hemodialysis catheter via the right internal jugular vein. The catheter tip lies at the cavoatrial junction. The catheter is ready for immediate use. Electronically Signed   By: Marcey Moan M.D.   On: 11/16/2023 17:03   IR US  Guide Vasc Access Right Result Date: 11/16/2023 CLINICAL DATA:  Renal failure and need for non tunneled dialysis catheter for immediate hemodialysis. EXAM: NON-TUNNELED CENTRAL VENOUS HEMODIALYSIS CATHETER PLACEMENT WITH ULTRASOUND AND FLUOROSCOPIC GUIDANCE FLUOROSCOPY: Radiation Exposure Index: 17.3 mGy Kerma PROCEDURE: The procedure, risks, benefits, and alternatives were explained to the patient. Questions regarding the procedure were encouraged and answered. The patient understands and consents to the procedure. A time out was performed prior to initiating the procedure. Ultrasound was used to confirm patency of the right internal jugular vein. An ultrasound image was saved and recorded. The right neck and chest were prepped with chlorhexidine  in a sterile fashion, and a sterile drape was applied covering the operative field. Maximum barrier sterile technique with sterile gowns and gloves were used for the procedure.  Local anesthesia was provided with 1% lidocaine . After creating a small venotomy incision, a 21 gauge needle  was advanced into the right internal jugular vein under direct, real-time ultrasound guidance. Ultrasound image documentation was performed. After securing guidewire access,the venotomy was dilated over a guide wire. A 16 cm, 13 Fr, triple lumen non-tunneled dialysis catheter was advanced over the wire. Final catheter positioning was confirmed and documented with a fluoroscopic spot image. The catheter was aspirated, flushed with saline, and injected with appropriate volume heparin  dwells. The catheter exit site was secured with 0-Prolene retention sutures. COMPLICATIONS: None.  No pneumothorax. FINDINGS: After catheter placement, the tip lies at the cavoatrial junction. The catheter aspirates normally and is ready for immediate use. IMPRESSION: Placement of non-tunneled central venous hemodialysis catheter via the right internal jugular vein. The catheter tip lies at the cavoatrial junction. The catheter is ready for immediate use. Electronically Signed   By: Marcey Moan M.D.   On: 11/16/2023 17:03    PMH:   Past Medical History:  Diagnosis Date   Atypical chest pain    a. non obstructive by cath in 2008 and 2010;  b. 02/2012 Myoview : non-ischemic, EF 57%   Cellulitis    a. left foot-> s/p L BKA   Chronic kidney disease    Constipation    CVA (cerebral infarction)    a.  Small right parietal noted incidentally 04/2007;  b. right sided embolic CVA 05/2012;  c. TEE 2/14:  LVH, EF 55-60%, mild LAE, no LAA clot, no PFO, no R->L shunt by echo contrast, oscillating density on AV likely Lambl's Excressence    Diabetes mellitus, type II (HCC)    Diabetic retinopathy (HCC)    NPDR w/edema OU   Diaphragmatic hernia without mention of obstruction or gangrene    Dry skin    Esophageal reflux    HTN (hypertension)    Hx of BKA (HCC)    Bilateral   Hyperlipidemia    Irritable bowel syndrome     Morbid obesity (HCC)    Obesity, unspecified    Pain in shoulder 06/28/2015   Peripheral neuropathy    Stroke [facility])    2013-'no residual'   Syncope and collapse    a. near-syncopal episode in November 2008;  b. 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   Tobacco abuse    Vertigo     PSH:   Past Surgical History:  Procedure Laterality Date   AMPUTATION  12/30/2011   Procedure: AMPUTATION RAY;  Surgeon: Norleen Armor, MD;  Location: [facility] OR;  Service: Orthopedics;  Laterality: Left;  LEFT FIRST RAY AMPUTATION   AMPUTATION  01/13/2012   Procedure: AMPUTATION BELOW KNEE;  Surgeon: Norleen Armor, MD;  Location: [facility] OR;  Service: Orthopedics;  Laterality: Left;   AMPUTATION  03/04/2012   Procedure: AMPUTATION BELOW KNEE;  Surgeon: Norleen Armor, MD;  Location: [facility];  Service: Orthopedics;  Laterality: Left;  Revision of Left Below Knee Amputation   AMPUTATION Right 11/02/2013   Procedure: AMPUTATION BELOW KNEE;  Surgeon: Jerona Harden GAILS, MD;  Location: [facility];  Service: Orthopedics;  Laterality: Right;  Right Below Knee Amputation   BIOPSY  03/29/2019   Procedure: BIOPSY;  Surgeon: San Sandor GAILS, DO;  Location: [facility];  Service: Gastroenterology;;  EGD and Colon   CARDIAC CATHETERIZATION     LAD 30%, circumflex 50%, OM 75%, RI 60% with small branch 80%, dominant RCA 60%, EF 45-50%   CHOLECYSTECTOMY     COLONOSCOPY     COLONOSCOPY WITH PROPOFOL  N/A 03/29/2019   Procedure: COLONOSCOPY WITH PROPOFOL ;  Surgeon: San Sandor GAILS, DO;  Location: [facility];  Service: Gastroenterology;  Laterality: N/A;   ESOPHAGOGASTRODUODENOSCOPY (EGD) WITH PROPOFOL  N/A 03/29/2019   Procedure: ESOPHAGOGASTRODUODENOSCOPY (EGD) WITH PROPOFOL ;  Surgeon: San Sandor GAILS, DO;  Location: [facility];  Service: Gastroenterology;  Laterality: N/A;   EYE SURGERY Right    cataract   I & D EXTREMITY  12/23/2011   Procedure: IRRIGATION AND DEBRIDEMENT EXTREMITY;  Surgeon: Elspeth JONELLE Her, MD;  Location: [facility] OR;  Service:  Orthopedics;  Laterality: Left;  Left Foot   I & D EXTREMITY  12/26/2011   Procedure: IRRIGATION AND DEBRIDEMENT EXTREMITY;  Surgeon: Norleen Armor, MD;  Location: [facility];  Service: Orthopedics;  Laterality: Left;  LEFT FOOT I&D WITH POSSIBLE WOUND VAC APPLICATION, POSSIBLE LEFT FIRST RAY AMPUTATION   I & D EXTREMITY Right 10/29/2013   Procedure: IRRIGATION AND DEBRIDEMENT EXTREMITY;  Surgeon: Reyes JAYSON Billing, MD;  Location: [facility];  Service: Orthopedics;  Laterality: Right;   Invasive Electrophysiologic Study  5/09   followed by insertion of an implantable loop recorder. S/p removal    IR FLUORO GUIDE CV LINE RIGHT  11/16/2023   IR US  GUIDE VASC ACCESS RIGHT  11/16/2023   Multiple Toe Surgeries     POLYPECTOMY  03/29/2019   Procedure: POLYPECTOMY;  Surgeon: San Sandor GAILS, DO;  Location: [facility];  Service: Gastroenterology;;   RIGHT/LEFT HEART CATH AND CORONARY ANGIOGRAPHY N/A 09/07/2023   Procedure: RIGHT/LEFT HEART CATH AND CORONARY ANGIOGRAPHY;  Surgeon: Anner Alm ORN, MD;  Location: [facility] INVASIVE CV LAB;  Service: Cardiovascular;  Laterality: N/A;   TEE WITHOUT CARDIOVERSION N/A 07/07/2012   Procedure: TRANSESOPHAGEAL ECHOCARDIOGRAM (TEE);  Surgeon: Redell GORMAN Shallow, MD;  Location: [facility] ENDOSCOPY;  Service: Cardiovascular;  Laterality: N/A;    Allergies:  Allergies  Allergen Reactions   Banana Swelling and Other (See Comments)    Facial swelling   Penicillins Anaphylaxis, Itching and Rash   Strawberry Extract Swelling    Facial swelling    Medications:   Prior to Admission medications   Medication Sig Start Date End Date Taking? Authorizing Provider  albuterol  (VENTOLIN  HFA) 108 (90 Base) MCG/ACT inhaler Inhale 2 puffs into the lungs in the morning and at bedtime.   Yes [provider]  aspirin  EC 81 MG tablet Take 81 mg by mouth daily.   Yes [provider]  atorvastatin  (LIPITOR ) 80 MG tablet Take 1 tablet (80 mg total) by mouth daily. 09/17/23  Yes Arrien,  Elidia Sieving, MD  chlorhexidine  (PERIDEX ) 0.12 % solution Use as directed 5 mLs in the mouth or throat 2 (two) times daily.   Yes [provider]  Cholecalciferol 1.25 MG (50000 UT) TABS Take 50,000 Units by mouth once a week. Take one tablet by mouth every Monday.   Yes [provider]  clopidogrel  (PLAVIX ) 75 MG tablet Take 1 tablet (75 mg total) by mouth daily. 09/17/23  Yes Arrien, Mauricio Daniel, MD  Dulaglutide 3 MG/0.5ML SOAJ Inject 4.5 mg into the skin once a week. Every Wednesday   Yes [provider]  ferrous sulfate  325 (65 FE) MG tablet Take 325 mg by mouth 2 (two) times daily.   Yes [provider]  furosemide  (LASIX ) 80 MG tablet Take 1 tablet (80 mg total) by mouth daily. 09/17/23  Yes Arrien, Elidia Sieving, MD  hydrALAZINE  (APRESOLINE ) 25 MG tablet Take 1 tablet (25 mg total) by mouth every 8 (eight) hours. 09/16/23  Yes Arrien, Mauricio Daniel, MD  Insulin  Glargine (BASAGLAR  KWIKPEN) 100 UNIT/ML Inject 15 Units  into the skin 2 (two) times daily.   Yes [provider]  insulin  lispro (HUMALOG) 100 UNIT/ML injection Inject 4-14 Units into the skin See admin instructions. Inject 14 units into the skin two times daily in addition to sliding scale 150-200=4 units,201-250 = 8 units, 251-300 = 8 units, 301-350= 10 units, 351-400= 10 units, 401-450=14 units, 451-500 =12 units >500 = 14 units   Yes [provider]  ipratropium-albuterol  (DUONEB) 0.5-2.5 (3) MG/3ML SOLN Inhale 3 mLs into the lungs every 8 (eight) hours as needed (SOB/wheezing). 09/20/23  Yes [provider]  isosorbide  dinitrate (ISORDIL ) 20 MG tablet Take 1 tablet (20 mg total) by mouth 2 (two) times daily. 09/16/23  Yes Arrien, Mauricio Daniel, MD  loratadine (CLARITIN) 10 MG tablet Take 10 mg by mouth daily.   Yes [provider]  magnesium  hydroxide (MILK OF MAGNESIA) 400 MG/5ML suspension Take 30 mLs by mouth daily.   Yes [provider]   metFORMIN  (GLUCOPHAGE ) 500 MG tablet Take 500 mg by mouth daily. 07/13/23  Yes [provider]  Metoprolol  Tartrate 37.5 MG TABS Take 1 tablet by mouth daily. Hold for SBP<110 or pulse <80 10/10/23  Yes [provider]  nystatin (MYCOSTATIN/NYSTOP) powder Apply 1 Application topically 3 (three) times daily. Apply to skin folds/ R side neck topically threes times a day for candidiasis. Apply to reddened areas on inner elbows and abdominal fold/R side of neck   Yes [provider]  Olopatadine HCl 0.7 % SOLN Place 1 drop into both eyes daily.   Yes [provider]  polyethylene glycol (MIRALAX  / GLYCOLAX ) packet Take 17 g by mouth daily as needed for moderate constipation. 11/04/13  Yes Rizwan, Saima, MD  senna-docusate (SENOKOT-S) 8.6-50 MG tablet Take 1 tablet by mouth daily.   Yes [provider]  tetrahydrozoline 0.05 % ophthalmic solution Place 1 drop into both eyes at bedtime.   Yes [provider]  Tetrahydrozoline HCl (VISINE OP) Place 1 drop into both eyes at bedtime.   Yes [provider]  vitamin C  (ASCORBIC ACID) 500 MG tablet Take 500 mg by mouth daily.   Yes [provider]  Zinc  Oxide 40 % PSTE Apply 1 Application topically 2 (two) times daily. Apply to sacrum topically every day and night shift; may also apply with each incontinent episode/   Yes [provider]  OXYGEN Inhale 2 L into the lungs as needed (Dyspnea).    [provider]    Discontinued Meds:   Medications Discontinued During This Encounter  Medication Reason   LANTUS  100 UNIT/ML injection Change in therapy   ergocalciferol (VITAMIN D2) 1.25 MG (50000 UT) capsule Change in therapy   PAZEO 0.7 % SOLN Change in therapy   Bevacizumab  (AVASTIN ) SOLN 1.25 mg Completed Course   Bevacizumab  (AVASTIN ) SOLN 1.25 mg Completed Course   Bevacizumab  (AVASTIN ) SOLN 1.25 mg Completed Course   Bevacizumab  (AVASTIN ) SOLN 1.25 mg Completed Course    metroNIDAZOLE  (FLAGYL ) IVPB 500 mg    metoprolol  succinate (TOPROL -XL) 25 MG 24 hr tablet Change in therapy   acetaminophen  (ACETAMINOPHEN  8 HOUR) 650 MG CR tablet Discontinued by provider   Loperamide HCl (IMODIUM PO) Discontinued by provider   Sodium Fluoride 1.1 % PSTE Discontinued by provider   Cholecalciferol (VITAMIN D) 50 MCG (2000 UT) tablet Dose change   LANTUS  SOLOSTAR 100 UNIT/ML Solostar Pen Change in therapy   albuterol  (PROVENTIL ) (2.5 MG/3ML) 0.083% nebulizer solution 2.5 mg    vancomycin  (VANCOREADY)  IVPB 1750 mg/350 mL    furosemide  (LASIX ) injection 80 mg    ceFEPIme  (MAXIPIME ) 2 g in sodium chloride  0.9 % 100 mL IVPB    heparin  ADULT infusion 100 units/mL (25000 units/250mL)    morphine  (PF) 2 MG/ML injection 2 mg    0.9 %  sodium chloride  infusion    sodium chloride  HYPERTONIC 3 % nebulizer solution 4 mL    aspirin  tablet 325 mg    insulin  aspart (novoLOG ) injection 0-9 Units    insulin  glargine-yfgn (SEMGLEE ) injection 6 Units     Social History:  reports that she has quit smoking. Her smoking use included cigarettes. She has a 10 pack-year smoking history. She has never used smokeless tobacco. She reports current alcohol use of about 4.0 standard drinks of alcohol per week. She reports that she does not use drugs.  Family History:   Family History  Problem Relation Age of Onset   Pneumonia Mother    Gallbladder disease Mother        cancer   Heart failure Mother    Diabetes Father    Coronary artery disease Father    Stroke Neg Hx     Blood pressure 104/71, pulse 81, temperature 98.4 F (36.9 C), resp. rate (!) 30, height 5' 6 (1.676 m), weight 108.9 kg, SpO2 100%. Physical Exam: Gen NAD, more alert today No rash, cyanosis or gangrene Sclera anicteric, throat clear  No jvd or bruits Chest clear bilat to bases, poor effort, RR14 RRR no MRG Abd soft ntnd no mass or ascites +bs Ext bilat BKA, 1+ bilateral hip edema, no UE edema Neuro is awake but  not answering questions; blank stare     Carriann Hesse, [facility] ORN, MD 11/18/2023, 10:40 AM

## 2023-11-19 ENCOUNTER — Inpatient Hospital Stay (HOSPITAL_COMMUNITY)

## 2023-11-19 DIAGNOSIS — R6520 Severe sepsis without septic shock: Secondary | ICD-10-CM | POA: Diagnosis not present

## 2023-11-19 DIAGNOSIS — A419 Sepsis, unspecified organism: Secondary | ICD-10-CM | POA: Diagnosis not present

## 2023-11-19 LAB — BLOOD GAS, ARTERIAL
Acid-base deficit: 5.3 mmol/L — ABNORMAL HIGH (ref 0.0–2.0)
Bicarbonate: 20 mmol/L (ref 20.0–28.0)
O2 Saturation: 100 %
Patient temperature: 36.6
pCO2 arterial: 36 mmHg (ref 32–48)
pH, Arterial: 7.35 (ref 7.35–7.45)
pO2, Arterial: 108 mmHg (ref 83–108)

## 2023-11-19 LAB — CBC
HCT: 29.3 % — ABNORMAL LOW (ref 36.0–46.0)
Hemoglobin: 9 g/dL — ABNORMAL LOW (ref 12.0–15.0)
MCH: 26.7 pg (ref 26.0–34.0)
MCHC: 30.7 g/dL (ref 30.0–36.0)
MCV: 86.9 fL (ref 80.0–100.0)
Platelets: 230 10*3/uL (ref 150–400)
RBC: 3.37 MIL/uL — ABNORMAL LOW (ref 3.87–5.11)
RDW: 20.2 % — ABNORMAL HIGH (ref 11.5–15.5)
WBC: 16.5 10*3/uL — ABNORMAL HIGH (ref 4.0–10.5)
nRBC: 4.1 % — ABNORMAL HIGH (ref 0.0–0.2)

## 2023-11-19 LAB — RENAL FUNCTION PANEL
Albumin: 2.4 g/dL — ABNORMAL LOW (ref 3.5–5.0)
Anion gap: 16 — ABNORMAL HIGH (ref 5–15)
BUN: 31 mg/dL — ABNORMAL HIGH (ref 8–23)
CO2: 20 mmol/L — ABNORMAL LOW (ref 22–32)
Calcium: 7.8 mg/dL — ABNORMAL LOW (ref 8.9–10.3)
Chloride: 102 mmol/L (ref 98–111)
Creatinine, Ser: 3.58 mg/dL — ABNORMAL HIGH (ref 0.44–1.00)
GFR, Estimated: 13 mL/min — ABNORMAL LOW (ref >60)
Glucose, Bld: 133 mg/dL — ABNORMAL HIGH (ref 70–99)
Phosphorus: 4.7 mg/dL — ABNORMAL HIGH (ref 2.5–4.6)
Potassium: 4.3 mmol/L (ref 3.5–5.1)
Sodium: 138 mmol/L (ref 135–145)

## 2023-11-19 LAB — GLUCOSE, CAPILLARY
Glucose-Capillary: 152 mg/dL — ABNORMAL HIGH (ref 70–99)
Glucose-Capillary: 168 mg/dL — ABNORMAL HIGH (ref 70–99)
Glucose-Capillary: 379 mg/dL — ABNORMAL HIGH (ref 70–99)
Glucose-Capillary: 362 mg/dL — ABNORMAL HIGH (ref 70–99)
Glucose-Capillary: 230 mg/dL — ABNORMAL HIGH (ref 70–99)

## 2023-11-19 NOTE — Progress Notes (Signed)
 PROGRESS NOTE    [name]  FMW:[id number] DOB: [birth year] DOA: 11/11/2023 PCP: [name] Hansel [name], MD   68 y.o. F with obesity, sCHF EF 20-25%, recent NSTEMI treated medically, DM and hx bilat BKA, HTN, CKD IIIa baseline 1.2 and hx stroke without residual who presented with SOB.  -Recently admitted for NSTEMI/CHF, underwent LHC that showed severe calcific disease, 80% mid-LAD, 90% prox RCA and 90% ramus intermedius, no PCI due to technically challenging targets, not CABG candidate.  EF 20-25%. -Recovered afterwards and was feeling well, until a day or two before admission, became SOB, sent from facility to hospital.    -In the ER, CXR showed bilateral opacities. Lactate >5.  New atrial fibrillation.  Suspected CHF, pneumonia sepsis, started on antibiotics, diuretics, Cardiology consulted.  -Subsequent hospital course complicated by renal failure.  CVC placed 6/30, initiated dialysis 7/1. -6/30 after temporary HD catheter was placed patient was noted to be nonverbal, MRI obtained noted 5 mm stroke in right frontal lobe, EEG negative - Palliative care following -7/2: Abx stopped, 7days completed   Subjective: Pt seen, aphasic, no events overnight  Assessment and Plan:  Severe sepsis due to community acquired pneumonia (HCC) Acute respiratory failure with hypoxia Presented with tachycardia, tachypnea, leukocytosis, and respiratory failure with lactic acid greater than 5.  Chest x-ray shows bilateral infiltrates.  Procalcitonin 0.6, trended up to >2. -  completed 7 days of Abx yesterday - Bronchodilators, pulmonary toilet - Continue midodrine   Acute renal failure superimposed on stage 3a chronic kidney disease (HCC) Baseline creatinine appears to be around 1.2 until her recent admission for CHF and NSTEMI, -now developed oliguric renal failure, likely ischemic ATN in the setting of IV contrast, sepsis, vancomycin , hypotension on 6/27 and diuresis for CHF. -Renal US   unremarkable.   -Vanc stopped, creatinine continued trending up. Nephrology consulted, HD catheter placed started hemodialysis 7/1 -Nephrology and palliative care following -poor candidate for longterm HD, called and updated niece and recommended consideration of Hospice, will ask Palliative care to set up another meeting  Acute on chronic combined systolic and diastolic CHF (congestive heart failure) [facility]) -  Cardiology consulted have since signed off.  EF 20-25% with advanced CAD not amenable to intervention. - Now complicated by AKI started HD, GDMT limited by hypotension -Dr.Danford d/w advanced heart failure team again on 7/1, given multiorgan failure, they do not recommend any additional interventions at this time  Acute CVA, lacunar infarct Acute metabolic encephalopathy At baseline, patient has no known cognitive impairment.   - On 6/30 noted to be aphasic, less responsive  -MRI brain was obtained that showed small lacunar infarct.  In right frontal lobe  -Consensus from neurology is that infarct likely incidental. -EEG negative for seizures, noted moderate diffuse encephalopathy  -Neurology recommended aspirin  and Plavix  for 3 weeks followed by Plavix  alone - Follow-up carotid duplex, LDL is 61, A1c 7 - PT OT following  Chronic iron  deficiency anemia Hemoglobin trending down in the setting of renal failure and phlebotomy. No clinical bleeding observed or reported. - Follow Hgb   Type 2 diabetes mellitus with hyperlipidemia (HCC) Remains somewhat hyperglycemic - Hold metformin , GLP-1 - Continue low dose glargine - Continue sliding scale corrections, renal function worsening will defer changing for now  Essential hypertension Cerebrovascular disease Hypotensive earlier in hospital stay, currently stable on midodrine  - Continue midodrine  - Holding Lasix , Isordil , hydralazine , metoprolol    HLD (hyperlipidemia) - Continue Lipitor   S/P BKA (below knee amputation) bilateral  (HCC)   PAF (  paroxysmal atrial fibrillation) (HCC) Previous ILR ~10 yrs ago showed no Afib.  On admission this time, transient Afib, now resolved.  In setting of very brief transient episode during sepsis, will defer [facility] for now. - Monitor on tele  Demand ischemia due to type 2 NSTEMI [facility]) Severe calcific coronary artery disease with chronic angina Evaluated by Cardiology.  They suspected type 2 NSTEMI, not ACS.   - Continue Lipitor , Plavix   Class 2 obesity BMI 38, morbid obesity in the setting of comorbid diabetes, hypertension  DVT prophylaxis: hep SQ Code Status: Full Code Family Communication: none present, called and updated neice Disposition Plan: TBD  Consultants:    Procedures:   Antimicrobials:    Objective: Vitals:   11/19/23 0119 11/19/23 0447 11/19/23 0742 11/19/23 1117  BP: (!) 110/49 100/60 (!) 100/46 (!) 108/55  Pulse: 82 77 82 82  Resp: 20 20 (!) 29 (!) 28  Temp: 98.4 F (36.9 C) 98.9 F (37.2 C) 98.5 F (36.9 C) (!) 97.4 F (36.3 C)  TempSrc: Oral Axillary Oral Oral  SpO2: 98% 99% 99% 100%  Weight:  103 kg    Height:        Intake/Output Summary (Last 24 hours) at 11/19/2023 1232 Last data filed at 11/19/2023 1225 Gross per 24 hour  Intake 240 ml  Output 75 ml  Net 165 ml   Filed Weights   11/18/23 0853 11/18/23 1205 11/19/23 0447  Weight: 108.9 kg 106.8 kg 103 kg    Examination:  General exam: AAO to self, tracks and follows commands, nonverbal Respiratory system: Diffuse scattered rhonchi Cardiovascular system: S1 & S2 heard, RRR.  Abd: nondistended, soft and nontender.Normal bowel sounds heard. Central nervous system: Awake and alert, nonverbal, no other localizing signs Extremities: bilateral BKA Skin: No rashes Psychiatry: Flat affect    Data Reviewed:   CBC: Recent Labs  Lab 11/15/23 1527 11/16/23 0307 11/17/23 0224 11/18/23 0226 11/19/23 0224  WBC 13.3* 16.4* 14.4* 16.4* 16.5*  HGB 9.8* 9.7* 8.6* 8.5* 9.0*  HCT 31.9*  32.9* 28.9* 26.8* 29.3*  MCV 84.6 86.6 88.9 83.2 86.9  PLT 218 220 201 229 230   Basic Metabolic Panel: Recent Labs  Lab 11/15/23 0432 11/16/23 0307 11/17/23 0224 11/18/23 0226 11/19/23 0224  NA 135 136 138 137 138  K 4.3 4.5 4.5 4.2 4.3  CL 104 105 104 100 102  CO2 13* 13* 16* 20* 20*  GLUCOSE 225* 205* 240* 106* 133*  BUN 62* 70* 80* 52* 31*  CREATININE 4.38* 4.94* 5.94* 4.53* 3.58*  CALCIUM  7.3* 7.6* 7.3* 7.3* 7.8*  PHOS  --  5.8* 6.4* 4.9* 4.7*   GFR: Estimated Creatinine Clearance: 18 mL/min (A) (by C-G formula based on SCr of 3.58 mg/dL (H)). Liver Function Tests: Recent Labs  Lab 11/13/23 0302 11/16/23 0307 11/17/23 0224 11/18/23 0226 11/19/23 0224  AST 32  --   --   --   --   ALT 21  --   --   --   --   ALKPHOS 100  --   --   --   --   BILITOT 0.8  --   --   --   --   PROT 6.1*  --   --   --   --   ALBUMIN  2.4* 2.5* 2.3* 2.4* 2.4*   No results for input(s): LIPASE, AMYLASE in the last 168 hours. No results for input(s): AMMONIA in the last 168 hours. Coagulation Profile: No results for input(s): INR, PROTIME  in the last 168 hours. Cardiac Enzymes: No results for input(s): CKTOTAL, CKMB, CKMBINDEX, TROPONINI in the last 168 hours. BNP (last 3 results) No results for input(s): PROBNP in the last 8760 hours. HbA1C: Recent Labs    11/17/23 0830  HGBA1C 7.0*   CBG: Recent Labs  Lab 11/18/23 1303 11/18/23 1654 11/18/23 2057 11/19/23 0606 11/19/23 1116  GLUCAP 114* 121* 118* 152* 168*   Lipid Profile: Recent Labs    11/17/23 0224  CHOL 80  HDL 29*  LDLCALC 34  TRIG 84  CHOLHDL 2.8   Thyroid  Function Tests: No results for input(s): TSH, T4TOTAL, FREET4, T3FREE, THYROIDAB in the last 72 hours. Anemia Panel: No results for input(s): VITAMINB12, FOLATE, FERRITIN, TIBC, IRON , RETICCTPCT in the last 72 hours. Urine analysis:    Component Value Date/Time   COLORURINE YELLOW 11/14/2023 1641    APPEARANCEUR CLOUDY (A) 11/14/2023 1641   LABSPEC >1.030 (H) 11/14/2023 1641   PHURINE 5.0 11/14/2023 1641   GLUCOSEU 100 (A) 11/14/2023 1641   HGBUR SMALL (A) 11/14/2023 1641   BILIRUBINUR NEGATIVE 11/14/2023 1641   KETONESUR 15 (A) 11/14/2023 1641   PROTEINUR >=300 11/14/2023 1641   UROBILINOGEN 0.2 12/31/2014 1928   NITRITE NEGATIVE 11/14/2023 1641   LEUKOCYTESUR SMALL (A) 11/14/2023 1641   Sepsis Labs: @LABRCNTIP (procalcitonin:4,lacticidven:4)  ) Recent Results (from the past 240 hours)  Blood culture (routine x 2)     Status: None   Collection Time: 11/11/23  1:08 PM   Specimen: BLOOD RIGHT FOREARM  Result Value Ref Range Status   Specimen Description BLOOD RIGHT FOREARM  Final   Special Requests   Final    BOTTLES DRAWN AEROBIC ONLY Blood Culture adequate volume   Culture   Final    NO GROWTH 5 DAYS Performed at [facility], [address], [place], [place] [address]    Report Status 11/16/2023 FINAL  Final  Blood culture (routine x 2)     Status: None   Collection Time: 11/11/23  3:05 PM   Specimen: BLOOD LEFT HAND  Result Value Ref Range Status   Specimen Description BLOOD LEFT HAND  Final   Special Requests   Final    BOTTLES DRAWN AEROBIC AND ANAEROBIC Blood Culture adequate volume   Culture   Final    NO GROWTH 5 DAYS Performed at [facility], [address], [place], [place] [address]    Report Status 11/16/2023 FINAL  Final  MRSA Next Gen by PCR, Nasal     Status: None   Collection Time: 11/13/23  6:51 AM   Specimen: Nasal Mucosa; Nasal Swab  Result Value Ref Range Status   MRSA by PCR Next Gen NOT DETECTED NOT DETECTED Final    Comment: (NOTE) The GeneXpert MRSA Assay (FDA approved for NASAL specimens only), is one component of a comprehensive MRSA colonization surveillance program. It is not intended to diagnose MRSA infection nor to guide or monitor treatment for MRSA infections. Test performance is not FDA approved in patients less  than 12 years old. Performed at [facility], [address], [place], [place] [address]      Radiology Studies: DG Swallowing Func-Speech Pathology Result Date: 11/19/2023 Table formatting from the original result was not included. Modified Barium Swallow Study Patient Details Name: [name] MRN: [id number] Date of Birth: Nov 11, 1955 Today's Date: 11/19/2023 HPI/PMH: HPI: ANTONETTA CLANTON is a 68 yo female presenting to ED 6/25 with SOB after recent admission for NSTEMI (not  a candidate for CABG or PCI). Admitted with sepsis and CAP. CT Chest shows extensive patchy and ground-glass opacity in both lungs with progression, compatible with worsening pulmonary edema and possible PNA. During HD cath placement 6/30, there was a sudden change to nonverbal and subsequent MRI showed R frontal CVA. Failed swallow screen 7/1 but unable to coordinate evaluation due to intermittent BiPAP use and HD. Previously seen by SLP in 2013 and 2014 with a functional appearing swallow and memory deficits. PMH includes SOB, NSTEMI, prior CVA, CKD 3A, bil BKA, HTN, DM, IBS, anemia, and diabetic retinopathy Clinical Impression: Clinical Impression: Pt has oral more than pharyngeal deficits, including incomplete labial seal and reduced lingual control/coordination. Mastication is incomplete with small, softened bite of cracker. Her lingual propulsion is brisk and her swallow initiates very timely, but there is some premature loss especially with liquids that results in trace penetration of thin and nectar thick liquids (PAS 2-3) and at least one instance of aspiration of thin liquids (PAS 8). Visibility of the cords was somewhat limited and there were other times during the study when pt did cough, but aspiration could not be visualized. Pt is not able to implement compensatory strategies herself at this time due to current cognitive-linguisitc abilities. Recommend starting with Dys 1 (puree) diet and nectar thick liquids.  Factors that may increase risk of adverse event in presence of aspiration Noe & Lianne 2021): Factors that may increase risk of adverse event in presence of aspiration Noe & Lianne 2021): Reduced cognitive function; Limited mobility; Dependence for feeding and/or oral hygiene Recommendations/Plan: Swallowing Evaluation Recommendations Swallowing Evaluation Recommendations Recommendations: PO diet PO Diet Recommendation: Dysphagia 1 (Pureed); Mildly thick liquids (Level 2, nectar thick) Liquid Administration via: Cup; Straw Medication Administration: Crushed with puree Supervision: Staff to assist with self-feeding; Full supervision/cueing for swallowing strategies Swallowing strategies  : Minimize environmental distractions; Slow rate; Small bites/sips; Follow solids with liquids Postural changes: Position pt fully upright for meals; Stay upright 30-60 min after meals Oral care recommendations: Oral care BID (2x/day) Treatment Plan Treatment Plan Treatment recommendations: Therapy as outlined in treatment plan below Follow-up recommendations: Skilled nursing-short term rehab (<3 hours/day) Functional status assessment: Patient has had a recent decline in their functional status and demonstrates the ability to make significant improvements in function in a reasonable and predictable amount of time. Treatment frequency: Min 2x/week Treatment duration: 2 weeks Interventions: Aspiration precaution training; Patient/family education; Trials of upgraded texture/liquids; Diet toleration management by SLP Recommendations Recommendations for follow up therapy are one component of a multi-disciplinary discharge planning process, led by the attending physician.  Recommendations may be updated based on patient status, additional functional criteria and insurance authorization. Assessment: Orofacial Exam: Orofacial Exam Oral Cavity - Dentition: Adequate natural dentition Anatomy: No data recorded Boluses Administered:  No data recorded Oral Impairment Domain: Oral Impairment Domain Lip Closure: Escape beyond mid-chin Tongue control during bolus hold: Posterior escape of greater than half of bolus (does not hold - aphasia) Bolus preparation/mastication: Disorganized chewing/mashing with solid pieces of bolus unchewed Bolus transport/lingual motion: Brisk tongue motion Oral residue: Trace residue lining oral structures Location of oral residue : Tongue Initiation of pharyngeal swallow : Pyriform sinuses  Pharyngeal Impairment Domain: Pharyngeal Impairment Domain Soft palate elevation: No bolus between soft palate (SP)/pharyngeal wall (PW) (does have some escape into nasopharynx, but cleared by the time of maximum displacement) Laryngeal elevation: Complete superior movement of thyroid  cartilage with complete approximation of arytenoids to epiglottic petiole Anterior hyoid excursion: Partial anterior  movement Epiglottic movement: Complete inversion Laryngeal vestibule closure: Complete, no air/contrast in laryngeal vestibule Pharyngeal stripping wave : Present - complete Pharyngeal contraction (A/P view only): N/A Pharyngoesophageal segment opening: Complete distension and complete duration, no obstruction of flow Tongue base retraction: No contrast between tongue base and posterior pharyngeal wall (PPW) Pharyngeal residue: Complete pharyngeal clearance Location of pharyngeal residue: N/A  Esophageal Impairment Domain: Esophageal Impairment Domain Esophageal clearance upright position: -- (full esophageal sweep not done but did see some retention in proximal esophagus during trials) Pill: No data recorded Penetration/Aspiration Scale Score: Penetration/Aspiration Scale Score 1.  Material does not enter airway: Moderately thick liquids (Level 3, honey thick); Puree; Solid 3.  Material enters airway, remains ABOVE vocal cords and not ejected out: Mildly thick liquids (Level 2, nectar thick) 8.  Material enters airway, passes BELOW  cords without attempt by patient to eject out (silent aspiration) : Thin liquids (Level 0) Compensatory Strategies: No data recorded  General Information: Caregiver present: No  Diet Prior to this Study: NPO   Temperature : Normal   Respiratory Status: Tachypneic   Supplemental O2: Nasal cannula   History of Recent Intubation: No  Behavior/Cognition: Alert; Cooperative; Doesn't follow directions Self-Feeding Abilities: Needs hand-over-hand assist for feeding Baseline vocal quality/speech: Not observed No data recorded Volitional Swallow: Unable to elicit Exam Limitations: Limited visibility Goal Planning: Prognosis for improved oropharyngeal function: Fair Barriers to Reach Goals: Language deficits No data recorded Patient/Family Stated Goal: difficulty stating Consulted and agree with results and recommendations: Pt unable/family or caregiver not available Pain: Pain Assessment Pain Assessment: Faces Faces Pain Scale: 0 Pain Intervention(s): Monitored during session End of Session: Start Time:SLP Start Time (ACUTE ONLY): 0901 Stop Time: SLP Stop Time (ACUTE ONLY): 9081 Time Calculation:SLP Time Calculation (min) (ACUTE ONLY): 17 min Charges: SLP Evaluations $ SLP Speech Visit: 1 Visit SLP Evaluations $BSS Swallow: 1 Procedure $MBS Swallow: 1 Procedure $ SLP EVAL LANGUAGE/SOUND PRODUCTION: 1 Procedure SLP visit diagnosis: SLP Visit Diagnosis: Dysphagia, oropharyngeal phase (R13.12) Past Medical History: Past Medical History: Diagnosis Date  Atypical chest pain   a. non obstructive by cath in 2008 and 2010;  b. 02/2012 Myoview : non-ischemic, EF 57%  Cellulitis   a. left foot-> s/p L BKA  Chronic kidney disease   Constipation   CVA (cerebral infarction)   a.  Small right parietal noted incidentally 04/2007;  b. right sided embolic CVA 05/2012;  c. TEE 2/14:  LVH, EF 55-60%, mild LAE, no LAA clot, no PFO, no R->L shunt by echo contrast, oscillating density on AV likely Lambl's Excressence   Diabetes mellitus, type II  (HCC)   Diabetic retinopathy (HCC)   NPDR w/edema OU  Diaphragmatic hernia without mention of obstruction or gangrene   Dry skin   Esophageal reflux   HTN (hypertension)   Hx of BKA (HCC)   Bilateral  Hyperlipidemia   Irritable bowel syndrome   Morbid obesity (HCC)   Obesity, unspecified   Pain in shoulder 06/28/2015  Peripheral neuropathy   [facility]   2013-'no residual'  Syncope and collapse   a. near-syncopal episode in November 2008;  b. 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  Tobacco abuse   Vertigo  Past Surgical History: Past Surgical History: Procedure Laterality Date  AMPUTATION  12/30/2011  Procedure: AMPUTATION RAY;  Surgeon: Norleen Armor, MD;  Location: [facility] OR;  Service: Orthopedics;  Laterality: Left;  LEFT FIRST RAY AMPUTATION  AMPUTATION  01/13/2012  Procedure: AMPUTATION BELOW KNEE;  Surgeon: Norleen Armor, MD;  Location: [facility]  OR;  Service: Orthopedics;  Laterality: Left;  AMPUTATION  03/04/2012  Procedure: AMPUTATION BELOW KNEE;  Surgeon: Norleen Armor, MD;  Location: [facility] OR;  Service: Orthopedics;  Laterality: Left;  Revision of Left Below Knee Amputation  AMPUTATION Right 11/02/2013  Procedure: AMPUTATION BELOW KNEE;  Surgeon: Jerona Harden GAILS, MD;  Location: [facility] OR;  Service: Orthopedics;  Laterality: Right;  Right Below Knee Amputation  BIOPSY  03/29/2019  Procedure: BIOPSY;  Surgeon: San Sandor GAILS, DO;  Location: [facility] ENDOSCOPY;  Service: Gastroenterology;;  EGD and Colon  CARDIAC CATHETERIZATION    LAD 30%, circumflex 50%, OM 75%, RI 60% with small branch 80%, dominant RCA 60%, EF 45-50%  CHOLECYSTECTOMY    COLONOSCOPY    COLONOSCOPY WITH PROPOFOL  N/A 03/29/2019  Procedure: COLONOSCOPY WITH PROPOFOL ;  Surgeon: San Sandor GAILS, DO;  Location: [facility] ENDOSCOPY;  Service: Gastroenterology;  Laterality: N/A;  ESOPHAGOGASTRODUODENOSCOPY (EGD) WITH PROPOFOL  N/A 03/29/2019  Procedure: ESOPHAGOGASTRODUODENOSCOPY (EGD) WITH PROPOFOL ;  Surgeon: San Sandor GAILS, DO;  Location: [facility] ENDOSCOPY;  Service:  Gastroenterology;  Laterality: N/A;  EYE SURGERY Right   cataract  I & D EXTREMITY  12/23/2011  Procedure: IRRIGATION AND DEBRIDEMENT EXTREMITY;  Surgeon: Elspeth JONELLE Her, MD;  Location: [facility] OR;  Service: Orthopedics;  Laterality: Left;  Left Foot  I & D EXTREMITY  12/26/2011  Procedure: IRRIGATION AND DEBRIDEMENT EXTREMITY;  Surgeon: Norleen Armor, MD;  Location: [facility] OR;  Service: Orthopedics;  Laterality: Left;  LEFT FOOT I&D WITH POSSIBLE WOUND VAC APPLICATION, POSSIBLE LEFT FIRST RAY AMPUTATION  I & D EXTREMITY Right 10/29/2013  Procedure: IRRIGATION AND DEBRIDEMENT EXTREMITY;  Surgeon: Reyes JAYSON Billing, MD;  Location: [facility] OR;  Service: Orthopedics;  Laterality: Right;  Invasive Electrophysiologic Study  5/09  followed by insertion of an implantable loop recorder. S/p removal   IR FLUORO GUIDE CV LINE RIGHT  11/16/2023  IR US  GUIDE VASC ACCESS RIGHT  11/16/2023  Multiple Toe Surgeries    POLYPECTOMY  03/29/2019  Procedure: POLYPECTOMY;  Surgeon: San Sandor GAILS, DO;  Location: [facility] ENDOSCOPY;  Service: Gastroenterology;;  RIGHT/LEFT HEART CATH AND CORONARY ANGIOGRAPHY N/A 09/07/2023  Procedure: RIGHT/LEFT HEART CATH AND CORONARY ANGIOGRAPHY;  Surgeon: Anner Alm ORN, MD;  Location: [facility] INVASIVE CV LAB;  Service: Cardiovascular;  Laterality: N/A;  TEE WITHOUT CARDIOVERSION N/A 07/07/2012  Procedure: TRANSESOPHAGEAL ECHOCARDIOGRAM (TEE);  Surgeon: Redell GORMAN Shallow, MD;  Location: [facility] ENDOSCOPY;  Service: Cardiovascular;  Laterality: N/A; Leita SAILOR., M.A. CCC-SLP Acute Rehabilitation Services Office: [phone] Secure chat preferred 11/19/2023, 9:57 AM  VAS US  CAROTID Result Date: 11/18/2023 Carotid Arterial Duplex Study Patient Name:  [name]  Date of Exam:   11/18/2023 Medical Rec #: [id number]         Accession #:    [id number] Date of Birth: 1955/06/14          Patient Gender: F Patient Age:   66 years Exam Location:  [facility] Procedure:      VAS US  CAROTID Referring Phys: ELLOUISE [facility]  --------------------------------------------------------------------------------  Indications:       CVA and SOB. Risk Factors:      Hypertension, hyperlipidemia, Diabetes, PAD. Other Factors:     Bilateral BKA. Limitations        Today's exam was limited due to the body habitus of the                    patient and temporary hemodialysis catheter placed right  lower neck and coughing throughout the exam. Comparison Study:  No recent carotid studies. Performing Technologist: Ricka Sturdivant-Jones RDMS, RVT  Examination Guidelines: A complete evaluation includes B-mode imaging, spectral Doppler, color Doppler, and power Doppler as needed of all accessible portions of each vessel. Bilateral testing is considered an integral part of a complete examination. Limited examinations for reoccurring indications may be performed as noted.  Right Carotid Findings: +----------+--------+--------+--------+------------------+------------------+           PSV cm/sEDV cm/sStenosisPlaque DescriptionComments           +----------+--------+--------+--------+------------------+------------------+ CCA Prox  52      18                                                   +----------+--------+--------+--------+------------------+------------------+ CCA Distal54      14                                intimal thickening +----------+--------+--------+--------+------------------+------------------+ ICA Prox  100     29      1-39%   calcific                             +----------+--------+--------+--------+------------------+------------------+ ICA Distal58      25                                                   +----------+--------+--------+--------+------------------+------------------+ ECA       107                                                          +----------+--------+--------+--------+------------------+------------------+  +----------+--------+-------+------------+-------------------+           PSV cm/sEDV cmsDescribe    Arm Pressure (mmHG) +----------+--------+-------+------------+-------------------+ Subclavian               Not assessed                    +----------+--------+-------+------------+-------------------+ +---------+--------+--------+------------+ VertebralPSV cm/sEDV cm/sNot assessed +---------+--------+--------+------------+  Left Carotid Findings: +----------+--------+--------+--------+------------------+------------------+           PSV cm/sEDV cm/sStenosisPlaque DescriptionComments           +----------+--------+--------+--------+------------------+------------------+ CCA Prox  55      10                                intimal thickening +----------+--------+--------+--------+------------------+------------------+ CCA Mid                                             intimal thickening +----------+--------+--------+--------+------------------+------------------+ CCA Distal50      17                                intimal thickening +----------+--------+--------+--------+------------------+------------------+  ICA Prox  55      15      1-39%                     intimal thickening +----------+--------+--------+--------+------------------+------------------+ ICA Distal82      25                                                   +----------+--------+--------+--------+------------------+------------------+ ECA       59      12                                                   +----------+--------+--------+--------+------------------+------------------+ +----------+--------+--------+----------------+-------------------+           PSV cm/sEDV cm/sDescribe        Arm Pressure (mmHG) +----------+--------+--------+----------------+-------------------+ [id number]             Multiphasic, WNL                     +----------+--------+--------+----------------+-------------------+ +---------+--------+--+--------+--+---------+ VertebralPSV cm/s46EDV cm/s14Antegrade +---------+--------+--+--------+--+---------+   Summary: Right Carotid: Velocities in the right ICA are consistent with a 1-39% stenosis. Left Carotid: Velocities in the left ICA are consistent with a 1-39% stenosis.  *See table(s) above for measurements and observations.  Electronically signed by Gaile New MD on 11/18/2023 at 9:56:07 PM.    Final    EEG adult Result Date: 11/17/2023 Shelton Arlin KIDD, MD     11/17/2023  5:49 PM Patient Name: [name] MRN: [id number] Epilepsy Attending: Arlin KIDD Shelton Referring Physician/Provider: everitt Clint Abbey Earle FORBES, NP Date: 11/17/2023 Duration: 25.36 mins Patient history: 42y F with ams. EEG to evaluate for seizure Level of alertness: Awake AEDs during EEG study: None Technical aspects: This EEG study was done with scalp electrodes positioned according to the 10-20 International system of electrode placement. Electrical activity was reviewed with band pass filter of 1-70Hz , sensitivity of 7 uV/mm, display speed of 40mm/sec with a 60Hz  notched filter applied as appropriate. EEG data were recorded continuously and digitally stored.  Video monitoring was available and reviewed as appropriate. Description: EEG showed continuous generalized predominantly 5 to 6 Hz theta slowing admixed with intermittent 2-3hz  delta slowing. Hyperventilation and photic stimulation were not performed.   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 No seizures or epileptiform discharges were seen throughout the recording. Priyanka O Yadav     Scheduled Meds:   stroke: early stages of recovery book   Does not apply Once   albuterol   2.5 mg Nebulization TID   atorvastatin   80 mg Oral Daily   Chlorhexidine  Gluconate Cloth  6 each Topical Q0600   clopidogrel    75 mg Oral Daily   heparin   5,000 Units Subcutaneous Q8H   insulin  aspart  0-15 Units Subcutaneous TID WC   insulin  aspart  0-5 Units Subcutaneous QHS   insulin  glargine-yfgn  9 Units Subcutaneous Daily   midodrine   2.5 mg Oral TID WC   pantoprazole   40 mg Oral Daily   sodium chloride  flush  3 mL Intravenous Q12H   thiamine   100 mg Oral Daily   Continuous Infusions:     LOS: 8  days    Time spent:    Sigurd Pac, MD [facility]   11/19/2023, 12:32 PM

## 2023-11-19 NOTE — TOC CAGE-AID Note (Signed)
 Transition of Care [facility]) - CAGE-AID Screening   Patient Details  Name: [name] MRN: [id number] Date of Birth: 10-Sep-1955  [facility] CM/SW Contact:    Karmine Kauer E Thad Osoria, LCSW Phone Number: 11/19/2023, 9:20 AM   Clinical Narrative:    CAGE-AID Screening: Substance Abuse Screening unable to be completed due to: : Patient unable to participate

## 2023-11-19 NOTE — Progress Notes (Signed)
 [facility] NEPHROLOGY PROGRESS NOTE  Assessment/ Plan: Pt is a 68 y.o. yo female  w/ DM with retinopathy and peripheral neuropathy, HTN, PAD with B/L AKA, HLD, CVA, CKD 3A as below who presented to ED sent from SNF due to SOB 11/11/2023.   # AKI on CKD 3b: b/l creat is 1.2- 1.7 from April - June 2025, eGFR 32- 48 ml/min.  She had IV contrast on 6/25 and then had multiple episodes of hypotension.  AKI likely due to ischemic ATN/contrast and hypotension. The patient was started on dialysis on 7/1 after placement of right IJ temporary HD catheter on 6/30. Last dialysis was yesterday and tolerated well.  We will hold HD today.  We will decide dialysis need tomorrow based on the lab results and urine output.  # BP/volume: UF with HD, on midodrine  for hypotension.  # Anemia: Check iron  level, may need erythropoietin.  # Sepsis due to pneumonia, acute respiratory failure with hypoxia: Completed 7 days of cefepime .  Subjective: Seen and examined.  No urine output.  Tolerated dialysis well yesterday.  Not talking much therefore review of system is limited. Objective Vital signs in last 24 hours: Vitals:   11/19/23 0119 11/19/23 0447 11/19/23 0742 11/19/23 1117  BP: (!) 110/49 100/60 (!) 100/46 (!) 108/55  Pulse: 82 77 82 82  Resp: 20 20 (!) 29 (!) 28  Temp: 98.4 F (36.9 C) 98.9 F (37.2 C) 98.5 F (36.9 C) (!) 97.4 F (36.3 C)  TempSrc: Oral Axillary Oral Oral  SpO2: 98% 99% 99% 100%  Weight:  103 kg    Height:       Weight change: 2 kg  Intake/Output Summary (Last 24 hours) at 11/19/2023 1148 Last data filed at 11/18/2023 1300 Gross per 24 hour  Intake --  Output 2075 ml  Net -2075 ml       Labs: RENAL PANEL Recent Labs  Lab 11/13/23 0302 11/13/23 1148 11/15/23 0432 11/16/23 0307 11/17/23 0224 11/18/23 0226 11/19/23 0224  NA 137   < > 135 136 138 137 138  K 3.9   < > 4.3 4.5 4.5 4.2 4.3  CL 103   < > 104 105 104 100 102  CO2 16*   < > 13* 13* 16* 20*  20*  GLUCOSE 195*   < > 225* 205* 240* 106* 133*  BUN 41*   < > 62* 70* 80* 52* 31*  CREATININE 2.76*   < > 4.38* 4.94* 5.94* 4.53* 3.58*  CALCIUM  7.7*   < > 7.3* 7.6* 7.3* 7.3* 7.8*  PHOS  --   --   --  5.8* 6.4* 4.9* 4.7*  ALBUMIN  2.4*  --   --  2.5* 2.3* 2.4* 2.4*   < > = values in this interval not displayed.    Liver Function Tests: Recent Labs  Lab 11/13/23 0302 11/16/23 0307 11/17/23 0224 11/18/23 0226 11/19/23 0224  AST 32  --   --   --   --   ALT 21  --   --   --   --   ALKPHOS 100  --   --   --   --   BILITOT 0.8  --   --   --   --   PROT 6.1*  --   --   --   --   ALBUMIN  2.4*   < > 2.3* 2.4* 2.4*   < > = values in this interval not displayed.   No results for  input(s): LIPASE, AMYLASE in the last 168 hours. No results for input(s): AMMONIA in the last 168 hours. CBC: Recent Labs    09/09/23 1101 09/10/23 0415 11/15/23 0432 11/15/23 1527 11/16/23 0307 11/17/23 0224 11/18/23 0226 11/19/23 0224  HGB  --    < >  --  9.8* 9.7* 8.6* 8.5* 9.0*  MCV  --    < >  --  84.6 86.6 88.9 83.2 86.9  VITAMINB12 578  --   --   --   --   --   --   --   FOLATE 12.8  --   --   --   --   --   --   --   FERRITIN 275  --  1,093*  --   --   --   --   --   TIBC 209*  --  192*  --   --   --   --   --   IRON  24*  --  54  --   --   --   --   --   RETICCTPCT 1.3  --   --   --   --   --   --   --    < > = values in this interval not displayed.    Cardiac Enzymes: No results for input(s): CKTOTAL, CKMB, CKMBINDEX, TROPONINI in the last 168 hours. CBG: Recent Labs  Lab 11/18/23 1303 11/18/23 1654 11/18/23 2057 11/19/23 0606 11/19/23 1116  GLUCAP 114* 121* 118* 152* 168*    Iron  Studies: No results for input(s): IRON , TIBC, TRANSFERRIN, FERRITIN in the last 72 hours. Studies/Results: DG Swallowing Func-Speech Pathology Result Date: 11/19/2023 Table formatting from the original result was not included. Modified Barium Swallow Study Patient Details Name:  SHAR PAEZ MRN: [id number] Date of Birth: 1956-01-18 Today's Date: 11/19/2023 HPI/PMH: HPI: KATLYNE NISHIDA is a 68 yo female presenting to ED 6/25 with SOB after recent admission for NSTEMI (not a candidate for CABG or PCI). Admitted with sepsis and CAP. CT Chest shows extensive patchy and ground-glass opacity in both lungs with progression, compatible with worsening pulmonary edema and possible PNA. During HD cath placement 6/30, there was a sudden change to nonverbal and subsequent MRI showed R frontal CVA. Failed swallow screen 7/1 but unable to coordinate evaluation due to intermittent BiPAP use and HD. Previously seen by SLP in 2013 and 2014 with a functional appearing swallow and memory deficits. PMH includes SOB, NSTEMI, prior CVA, CKD 3A, bil BKA, HTN, DM, IBS, anemia, and diabetic retinopathy Clinical Impression: Clinical Impression: Pt has oral more than pharyngeal deficits, including incomplete labial seal and reduced lingual control/coordination. Mastication is incomplete with small, softened bite of cracker. Her lingual propulsion is brisk and her swallow initiates very timely, but there is some premature loss especially with liquids that results in trace penetration of thin and nectar thick liquids (PAS 2-3) and at least one instance of aspiration of thin liquids (PAS 8). Visibility of the cords was somewhat limited and there were other times during the study when pt did cough, but aspiration could not be visualized. Pt is not able to implement compensatory strategies herself at this time due to current cognitive-linguisitc abilities. Recommend starting with Dys 1 (puree) diet and nectar thick liquids. Factors that may increase risk of adverse event in presence of aspiration Noe & Lianne 2021): Factors that may increase risk of adverse event in presence of aspiration Noe & Lianne 2021): Reduced cognitive  function; Limited mobility; Dependence for feeding and/or oral hygiene  Recommendations/Plan: Swallowing Evaluation Recommendations Swallowing Evaluation Recommendations Recommendations: PO diet PO Diet Recommendation: Dysphagia 1 (Pureed); Mildly thick liquids (Level 2, nectar thick) Liquid Administration via: Cup; Straw Medication Administration: Crushed with puree Supervision: Staff to assist with self-feeding; Full supervision/cueing for swallowing strategies Swallowing strategies  : Minimize environmental distractions; Slow rate; Small bites/sips; Follow solids with liquids Postural changes: Position pt fully upright for meals; Stay upright 30-60 min after meals Oral care recommendations: Oral care BID (2x/day) Treatment Plan Treatment Plan Treatment recommendations: Therapy as outlined in treatment plan below Follow-up recommendations: Skilled nursing-short term rehab (<3 hours/day) Functional status assessment: Patient has had a recent decline in their functional status and demonstrates the ability to make significant improvements in function in a reasonable and predictable amount of time. Treatment frequency: Min 2x/week Treatment duration: 2 weeks Interventions: Aspiration precaution training; Patient/family education; Trials of upgraded texture/liquids; Diet toleration management by SLP Recommendations Recommendations for follow up therapy are one component of a multi-disciplinary discharge planning process, led by the attending physician.  Recommendations may be updated based on patient status, additional functional criteria and insurance authorization. Assessment: Orofacial Exam: Orofacial Exam Oral Cavity - Dentition: Adequate natural dentition Anatomy: No data recorded Boluses Administered: No data recorded Oral Impairment Domain: Oral Impairment Domain Lip Closure: Escape beyond mid-chin Tongue control during bolus hold: Posterior escape of greater than half of bolus (does not hold - aphasia) Bolus preparation/mastication: Disorganized chewing/mashing with solid pieces of  bolus unchewed Bolus transport/lingual motion: Brisk tongue motion Oral residue: Trace residue lining oral structures Location of oral residue : Tongue Initiation of pharyngeal swallow : Pyriform sinuses  Pharyngeal Impairment Domain: Pharyngeal Impairment Domain Soft palate elevation: No bolus between soft palate (SP)/pharyngeal wall (PW) (does have some escape into nasopharynx, but cleared by the time of maximum displacement) Laryngeal elevation: Complete superior movement of thyroid  cartilage with complete approximation of arytenoids to epiglottic petiole Anterior hyoid excursion: Partial anterior movement Epiglottic movement: Complete inversion Laryngeal vestibule closure: Complete, no air/contrast in laryngeal vestibule Pharyngeal stripping wave : Present - complete Pharyngeal contraction (A/P view only): N/A Pharyngoesophageal segment opening: Complete distension and complete duration, no obstruction of flow Tongue base retraction: No contrast between tongue base and posterior pharyngeal wall (PPW) Pharyngeal residue: Complete pharyngeal clearance Location of pharyngeal residue: N/A  Esophageal Impairment Domain: Esophageal Impairment Domain Esophageal clearance upright position: -- (full esophageal sweep not done but did see some retention in proximal esophagus during trials) Pill: No data recorded Penetration/Aspiration Scale Score: Penetration/Aspiration Scale Score 1.  Material does not enter airway: Moderately thick liquids (Level 3, honey thick); Puree; Solid 3.  Material enters airway, remains ABOVE vocal cords and not ejected out: Mildly thick liquids (Level 2, nectar thick) 8.  Material enters airway, passes BELOW cords without attempt by patient to eject out (silent aspiration) : Thin liquids (Level 0) Compensatory Strategies: No data recorded  General Information: Caregiver present: No  Diet Prior to this Study: NPO   Temperature : Normal   Respiratory Status: Tachypneic   Supplemental O2: Nasal  cannula   History of Recent Intubation: No  Behavior/Cognition: Alert; Cooperative; Doesn't follow directions Self-Feeding Abilities: Needs hand-over-hand assist for feeding Baseline vocal quality/speech: Not observed No data recorded Volitional Swallow: Unable to elicit Exam Limitations: Limited visibility Goal Planning: Prognosis for improved oropharyngeal function: Fair Barriers to Reach Goals: Language deficits No data recorded Patient/Family Stated Goal: difficulty stating Consulted and agree with results and recommendations: Pt  unable/family or caregiver not available Pain: Pain Assessment Pain Assessment: Faces Faces Pain Scale: 0 Pain Intervention(s): Monitored during session End of Session: Start Time:SLP Start Time (ACUTE ONLY): 0901 Stop Time: SLP Stop Time (ACUTE ONLY): 9081 Time Calculation:SLP Time Calculation (min) (ACUTE ONLY): 17 min Charges: SLP Evaluations $ SLP Speech Visit: 1 Visit SLP Evaluations $BSS Swallow: 1 Procedure $MBS Swallow: 1 Procedure $ SLP EVAL LANGUAGE/SOUND PRODUCTION: 1 Procedure SLP visit diagnosis: SLP Visit Diagnosis: Dysphagia, oropharyngeal phase (R13.12) Past Medical History: Past Medical History: Diagnosis Date  Atypical chest pain   a. non obstructive by cath in 2008 and 2010;  b. 02/2012 Myoview : non-ischemic, EF 57%  Cellulitis   a. left foot-> s/p L BKA  Chronic kidney disease   Constipation   CVA (cerebral infarction)   a.  Small right parietal noted incidentally 04/2007;  b. right sided embolic CVA 05/2012;  c. TEE 2/14:  LVH, EF 55-60%, mild LAE, no LAA clot, no PFO, no R->L shunt by echo contrast, oscillating density on AV likely Lambl's Excressence   Diabetes mellitus, type II (HCC)   Diabetic retinopathy (HCC)   NPDR w/edema OU  Diaphragmatic hernia without mention of obstruction or gangrene   Dry skin   Esophageal reflux   HTN (hypertension)   Hx of BKA (HCC)   Bilateral  Hyperlipidemia   Irritable bowel syndrome   Morbid obesity (HCC)   Obesity, unspecified    Pain in shoulder 06/28/2015  Peripheral neuropathy   Stroke [facility])   2013-'no residual'  Syncope and collapse   a. near-syncopal episode in November 2008;  b. 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  Tobacco abuse   Vertigo  Past Surgical History: Past Surgical History: Procedure Laterality Date  AMPUTATION  12/30/2011  Procedure: AMPUTATION RAY;  Surgeon: Norleen Armor, MD;  Location: [facility] OR;  Service: Orthopedics;  Laterality: Left;  LEFT FIRST RAY AMPUTATION  AMPUTATION  01/13/2012  Procedure: AMPUTATION BELOW KNEE;  Surgeon: Norleen Armor, MD;  Location: [facility] OR;  Service: Orthopedics;  Laterality: Left;  AMPUTATION  03/04/2012  Procedure: AMPUTATION BELOW KNEE;  Surgeon: Norleen Armor, MD;  Location: [facility] OR;  Service: Orthopedics;  Laterality: Left;  Revision of Left Below Knee Amputation  AMPUTATION Right 11/02/2013  Procedure: AMPUTATION BELOW KNEE;  Surgeon: Jerona Harden GAILS, MD;  Location: [facility] OR;  Service: Orthopedics;  Laterality: Right;  Right Below Knee Amputation  BIOPSY  03/29/2019  Procedure: BIOPSY;  Surgeon: San Sandor GAILS, DO;  Location: [facility] ENDOSCOPY;  Service: Gastroenterology;;  EGD and Colon  CARDIAC CATHETERIZATION    LAD 30%, circumflex 50%, OM 75%, RI 60% with small branch 80%, dominant RCA 60%, EF 45-50%  CHOLECYSTECTOMY    COLONOSCOPY    COLONOSCOPY WITH PROPOFOL  N/A 03/29/2019  Procedure: COLONOSCOPY WITH PROPOFOL ;  Surgeon: San Sandor GAILS, DO;  Location: [facility] ENDOSCOPY;  Service: Gastroenterology;  Laterality: N/A;  ESOPHAGOGASTRODUODENOSCOPY (EGD) WITH PROPOFOL  N/A 03/29/2019  Procedure: ESOPHAGOGASTRODUODENOSCOPY (EGD) WITH PROPOFOL ;  Surgeon: San Sandor GAILS, DO;  Location: [facility] ENDOSCOPY;  Service: Gastroenterology;  Laterality: N/A;  EYE SURGERY Right   cataract  I & D EXTREMITY  12/23/2011  Procedure: IRRIGATION AND DEBRIDEMENT EXTREMITY;  Surgeon: Elspeth JONELLE Her, MD;  Location: [facility] OR;  Service: Orthopedics;  Laterality: Left;  Left Foot  I & D EXTREMITY  12/26/2011  Procedure: IRRIGATION AND  DEBRIDEMENT EXTREMITY;  Surgeon: Norleen Armor, MD;  Location: [facility] OR;  Service: Orthopedics;  Laterality: Left;  LEFT FOOT I&D WITH POSSIBLE WOUND VAC APPLICATION, POSSIBLE LEFT FIRST RAY  AMPUTATION  I & D EXTREMITY Right 10/29/2013  Procedure: IRRIGATION AND DEBRIDEMENT EXTREMITY;  Surgeon: Reyes JAYSON Billing, MD;  Location: [facility] OR;  Service: Orthopedics;  Laterality: Right;  Invasive Electrophysiologic Study  5/09  followed by insertion of an implantable loop recorder. S/p removal   IR FLUORO GUIDE CV LINE RIGHT  11/16/2023  IR US  GUIDE VASC ACCESS RIGHT  11/16/2023  Multiple Toe Surgeries    POLYPECTOMY  03/29/2019  Procedure: POLYPECTOMY;  Surgeon: San Sandor GAILS, DO;  Location: [facility];  Service: Gastroenterology;;  RIGHT/LEFT HEART CATH AND CORONARY ANGIOGRAPHY N/A 09/07/2023  Procedure: RIGHT/LEFT HEART CATH AND CORONARY ANGIOGRAPHY;  Surgeon: Anner Alm ORN, MD;  Location: [facility] INVASIVE CV LAB;  Service: Cardiovascular;  Laterality: N/A;  TEE WITHOUT CARDIOVERSION N/A 07/07/2012  Procedure: TRANSESOPHAGEAL ECHOCARDIOGRAM (TEE);  Surgeon: Redell GORMAN Shallow, MD;  Location: [facility] ENDOSCOPY;  Service: Cardiovascular;  Laterality: N/A; Leita SAILOR., M.A. CCC-SLP Acute Rehabilitation Services Office: [phone] Secure chat preferred 11/19/2023, 9:57 AM  VAS US  CAROTID Result Date: 11/18/2023 Carotid Arterial Duplex Study Patient Name:  ISELA STANTZ  Date of Exam:   11/18/2023 Medical Rec #: [id number]         Accession #:    [id number] Date of Birth: [birth year]          Patient Gender: F Patient Age:   52 years Exam Location:  [facility] Procedure:      VAS US  CAROTID Referring Phys: ELLOUISE [facility] --------------------------------------------------------------------------------  Indications:       CVA and SOB. Risk Factors:      Hypertension, hyperlipidemia, Diabetes, PAD. Other Factors:     Bilateral BKA. Limitations        Today's exam was limited due to the body habitus of the                    patient and  temporary hemodialysis catheter placed right                    lower neck and coughing throughout the exam. Comparison Study:  No recent carotid studies. Performing Technologist: Ricka Sturdivant-Jones RDMS, RVT  Examination Guidelines: A complete evaluation includes B-mode imaging, spectral Doppler, color Doppler, and power Doppler as needed of all accessible portions of each vessel. Bilateral testing is considered an integral part of a complete examination. Limited examinations for reoccurring indications may be performed as noted.  Right Carotid Findings: +----------+--------+--------+--------+------------------+------------------+           PSV cm/sEDV cm/sStenosisPlaque DescriptionComments           +----------+--------+--------+--------+------------------+------------------+ CCA Prox  52      18                                                   +----------+--------+--------+--------+------------------+------------------+ CCA Distal54      14                                intimal thickening +----------+--------+--------+--------+------------------+------------------+ ICA Prox  100     29      1-39%   calcific                             +----------+--------+--------+--------+------------------+------------------+ ICA Distal58  25                                                   +----------+--------+--------+--------+------------------+------------------+ ECA       107                                                          +----------+--------+--------+--------+------------------+------------------+ +----------+--------+-------+------------+-------------------+           PSV cm/sEDV cmsDescribe    Arm Pressure (mmHG) +----------+--------+-------+------------+-------------------+ Subclavian               Not assessed                    +----------+--------+-------+------------+-------------------+ +---------+--------+--------+------------+  VertebralPSV cm/sEDV cm/sNot assessed +---------+--------+--------+------------+  Left Carotid Findings: +----------+--------+--------+--------+------------------+------------------+           PSV cm/sEDV cm/sStenosisPlaque DescriptionComments           +----------+--------+--------+--------+------------------+------------------+ CCA Prox  55      10                                intimal thickening +----------+--------+--------+--------+------------------+------------------+ CCA Mid                                             intimal thickening +----------+--------+--------+--------+------------------+------------------+ CCA Distal50      17                                intimal thickening +----------+--------+--------+--------+------------------+------------------+ ICA Prox  55      15      1-39%                     intimal thickening +----------+--------+--------+--------+------------------+------------------+ ICA Distal82      25                                                   +----------+--------+--------+--------+------------------+------------------+ ECA       59      12                                                   +----------+--------+--------+--------+------------------+------------------+ +----------+--------+--------+----------------+-------------------+           PSV cm/sEDV cm/sDescribe        Arm Pressure (mmHG) +----------+--------+--------+----------------+-------------------+ [id number]             Multiphasic, WNL                    +----------+--------+--------+----------------+-------------------+ +---------+--------+--+--------+--+---------+ VertebralPSV cm/s46EDV cm/s14Antegrade +---------+--------+--+--------+--+---------+   Summary: Right Carotid: Velocities in the right ICA are consistent with a 1-39% stenosis. Left Carotid: Velocities in the left ICA are  consistent with a 1-39% stenosis.  *See table(s) above  for measurements and observations.  Electronically signed by Gaile New MD on 11/18/2023 at 9:56:07 PM.    Final    EEG adult Result Date: 11/17/2023 Shelton Arlin KIDD, MD     11/17/2023  5:49 PM Patient Name: JAYLAN DUGGAR MRN: [id number] Epilepsy Attending: Arlin KIDD Shelton Referring Physician/Provider: everitt Clint Abbey Earle FORBES, NP Date: 11/17/2023 Duration: 25.36 mins Patient history: 65y F with ams. EEG to evaluate for seizure Level of alertness: Awake AEDs during EEG study: None Technical aspects: This EEG study was done with scalp electrodes positioned according to the 10-20 International system of electrode placement. Electrical activity was reviewed with band pass filter of 1-70Hz , sensitivity of 7 uV/mm, display speed of 11mm/sec with a 60Hz  notched filter applied as appropriate. EEG data were recorded continuously and digitally stored.  Video monitoring was available and reviewed as appropriate. Description: EEG showed continuous generalized predominantly 5 to 6 Hz theta slowing admixed with intermittent 2-3hz  delta slowing. Hyperventilation and photic stimulation were not performed.   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 No seizures or epileptiform discharges were seen throughout the recording. Priyanka O Yadav    Medications: Infusions:   Scheduled Medications:   stroke: early stages of recovery book   Does not apply Once   albuterol   2.5 mg Nebulization TID   atorvastatin   80 mg Oral Daily   Chlorhexidine  Gluconate Cloth  6 each Topical Q0600   clopidogrel   75 mg Oral Daily   heparin   5,000 Units Subcutaneous Q8H   insulin  aspart  0-15 Units Subcutaneous TID WC   insulin  aspart  0-5 Units Subcutaneous QHS   insulin  glargine-yfgn  9 Units Subcutaneous Daily   midodrine   2.5 mg Oral TID WC   pantoprazole   40 mg Oral Daily   sodium chloride  flush  3 mL Intravenous Q12H   thiamine   100 mg Oral Daily    have  reviewed scheduled and prn medications.  Physical Exam: General:NAD, comfortable Heart:RRR, s1s2 nl Lungs:clear b/l, no crackle Abdomen:soft, Non-tender, non-distended Extremities: Trace peripheral edema present Dialysis Access: Right IJ temporary HD catheter  Caitlynne Harbeck Prasad Ison Wichmann 11/19/2023,11:48 AM  LOS: 8 days

## 2023-11-19 NOTE — Evaluation (Signed)
 Modified Barium Swallow Study  Patient Details  Name: [name] MRN: [id number] Date of Birth: [birth year]  Today's Date: 11/19/2023  Modified Barium Swallow completed.  Full report located under Chart Review in the Imaging Section.  History of Present Illness [name] is a 68 yo female presenting to ED 6/25 with SOB after recent admission for NSTEMI (not a candidate for CABG or PCI). Admitted with sepsis and CAP. CT Chest shows extensive patchy and ground-glass opacity in both lungs with progression, compatible with worsening pulmonary edema and possible PNA. During HD cath placement 6/30, there was a sudden change to nonverbal and subsequent MRI showed R frontal CVA. Failed swallow screen 7/1 but unable to coordinate evaluation due to intermittent BiPAP use and HD. Previously seen by SLP in 2013 and 2014 with a functional appearing swallow and memory deficits. PMH includes SOB, NSTEMI, prior CVA, CKD 3A, bil BKA, HTN, DM, IBS, anemia, and diabetic retinopathy   Clinical Impression Pt has oral more than pharyngeal deficits, including incomplete labial seal and reduced lingual control/coordination. Mastication is incomplete with small, softened bite of cracker. Her lingual propulsion is brisk and her swallow initiates very timely, but there is some premature loss especially with liquids that results in trace penetration of thin and nectar thick liquids (PAS 2-3) and at least one instance of aspiration of thin liquids (PAS 8). Visibility of the cords was somewhat limited and there were other times during the study when pt did cough, but aspiration could not be visualized. Pt is not able to implement compensatory strategies herself at this time due to current cognitive-linguisitc abilities. Recommend starting with Dys 1 (puree) diet and nectar thick liquids.  Factors that may increase risk of adverse event in presence of aspiration Noe & Lianne 2021): Reduced cognitive function;Limited  mobility;Dependence for feeding and/or oral hygiene  Swallow Evaluation Recommendations Recommendations: PO diet PO Diet Recommendation: Dysphagia 1 (Pureed);Mildly thick liquids (Level 2, nectar thick) Liquid Administration via: Cup;Straw Medication Administration: Crushed with puree Supervision: Staff to assist with self-feeding;Full supervision/cueing for swallowing strategies Swallowing strategies  : Minimize environmental distractions;Slow rate;Small bites/sips;Follow solids with liquids Postural changes: Position pt fully upright for meals;Stay upright 30-60 min after meals Oral care recommendations: Oral care BID (2x/day)      [name]., M.A. CCC-SLP Acute Rehabilitation Services Office: [phone]  Secure chat preferred  11/19/2023,9:55 AM

## 2023-11-19 NOTE — Progress Notes (Signed)
 Nutrition Follow Up  DOCUMENTATION CODES:   Obesity unspecified  INTERVENTION:  Continue DYS 1 diet until SLP recommends advancement  Discontinued Ensure Max  Added Mighty Shake TID with meals, each supplement provides 330 kcals and 9 grams of protein   Encouraged adequate intake of meals to help meet calorie and protein needs and aid in recovery, requires full assistance with meals  NUTRITION DIAGNOSIS:   Inadequate oral intake related to acute illness, poor appetite (SOB, hypotension, reliance on Festus O2) as evidenced by estimated needs, meal completion < 25%. Remains applicable  GOAL:   Patient will meet greater than or equal to 90% of their needs Progressing  MONITOR:   PO intake, Supplement acceptance  REASON FOR ASSESSMENT:   Consult Other (Comment) (nutritional goals)  ASSESSMENT:   Pt with hx of type 2 diabetes w/ neuropathy, chronic iron  deficiency anemia, chronic heart failure, IBS, HTN, and stroke. Hx of L BKA (2013) and R BKA (2015). Pt admitted with SOB and hypotensive, diagnosed with sepsis related to community acquired pneumonia.  6/30 CVC placed 7/1 dialysis initiated for AKI, 2L removed 7/2 HD #2, 2L removed 7/3 MBS conducted, SLP recommends DYS 1 w/ nectar thick  Spoke with pt who was awake but continues to be aphasic. Pt not suitable candidate for longterm HD and palliative has been following and asked to set up meeting about GOC with consideration of hospice. Pt has had overall poor PO intake, but RN reports pt did eat 100% of lunch today 7/3 with full feeding assistance. Added Mighty Shake supplements to trays to help with calorie and protein intake, will monitor for tolerance/acceptance. Will continue to monitor GOC discussions and nutrition intake.  Medications reviewed and include:  Protonix  Thiamine  100 mg SSI novolog   Labs reviewed:  CBG over last 24: 104-152 mg/dL J8R 7.3 BUN 68/ Creatinine 3.58 Phosphorus 4.7  Diet Order:   Diet Order              Diet NPO time specified  Diet effective now                   EDUCATION NEEDS:   Education needs have been addressed  Skin:  Skin Assessment: Reviewed RN Assessment  Last BM:  6/29  Height:   Ht Readings from Last 1 Encounters:  11/12/23 5' 6 (1.676 m)   Weight:   Wt Readings from Last 1 Encounters:  11/19/23 103 kg   Ideal Body Weight:  59.1 kg  BMI:  Body mass index is 36.65 kg/m.  Estimated Nutritional Needs:   Kcal:  1500-1700  Protein:  65-80g  Fluid:  1.5-1.7L  Josette Glance, MS, RDN, LDN Clinical Dietitian I Please reach out via secure chat

## 2023-11-19 NOTE — Plan of Care (Signed)
  Problem: Education: Goal: Ability to describe self-care measures that may prevent or decrease complications (Diabetes Survival Skills Education) will improve Outcome: Progressing   Problem: Coping: Goal: Level of anxiety will decrease Outcome: Progressing

## 2023-11-19 NOTE — TOC Progression Note (Addendum)
 Transition of Care [facility]) - Progression Note    Patient Details  Name: [name] MRN: [id number] Date of Birth: 14-Nov-1955  Transition of Care [facility]) CM/SW Contact  Luise JAYSON Pan, [place] Phone Number: 11/19/2023, 8:38 AM  Clinical Narrative:   CSW notified Dialysis coordinator that patient will return to [facility] when medically stable.  Per chart review, palliative care is following.  2:45 PM [facility] called CSW and stated if patient is needing hospice that they partner with [facility] and can provide those services. CSW notified MD. Facility inquiring about potential DC date for patient.   2:48 PM Per MD, patient may potentially dc over the weekend, awaiting Palliative meeting tomorrow to see if family would like to consider hospice. CSW notified facility and updated them on patients bipap needs.   2:54 PM Per facility, due to the holiday and weekend, facility may not be able to obtain bipap in time for potential discharge over the weekend. [facility] can accept on Monday 7/7. MD notified.   TOC will continue to follow.    Expected Discharge Plan: Long Term Nursing Home Barriers to Discharge: Continued Medical Work up  Expected Discharge Plan and Services In-house Referral: Clinical Social Work     Living arrangements for the past 2 months: Skilled Nursing Facility                                       Social Determinants of Health (SDOH) Interventions SDOH Screenings   Food Insecurity: No Food Insecurity (11/12/2023)  Housing: Low Risk  (11/12/2023)  Transportation Needs: No Transportation Needs (11/12/2023)  Utilities: Not At Risk (11/12/2023)  Social Connections: Unknown (11/12/2023)  Tobacco Use: Medium Risk (11/11/2023)    Readmission Risk Interventions     No data to display

## 2023-11-19 NOTE — Plan of Care (Signed)

## 2023-11-19 NOTE — Progress Notes (Signed)
 OT Cancellation Note  Patient Details Name: [name] MRN: [id number] DOB: Mar 02, 1956   Cancelled Treatment:    Reason Eval/Treat Not Completed: OT screened, no needs identified, will sign off. Pt is from LTC snf is total care and plan is to return to SNF. Will defer any OT needs to SNF.   Yassmine Tamm C, OT  Acute Rehabilitation Services Office [phone] Secure chat preferred   Adrianne GORMAN Savers 11/19/2023, 7:23 AM

## 2023-11-20 DIAGNOSIS — Z515 Encounter for palliative care: Secondary | ICD-10-CM

## 2023-11-20 DIAGNOSIS — R6520 Severe sepsis without septic shock: Secondary | ICD-10-CM | POA: Diagnosis not present

## 2023-11-20 DIAGNOSIS — Z7189 Other specified counseling: Secondary | ICD-10-CM | POA: Diagnosis not present

## 2023-11-20 DIAGNOSIS — A419 Sepsis, unspecified organism: Secondary | ICD-10-CM | POA: Diagnosis not present

## 2023-11-20 LAB — RENAL FUNCTION PANEL
Albumin: 2.5 g/dL — ABNORMAL LOW (ref 3.5–5.0)
Anion gap: 13 (ref 5–15)
BUN: 49 mg/dL — ABNORMAL HIGH (ref 8–23)
CO2: 21 mmol/L — ABNORMAL LOW (ref 22–32)
Calcium: 7.4 mg/dL — ABNORMAL LOW (ref 8.9–10.3)
Chloride: 105 mmol/L (ref 98–111)
Creatinine, Ser: 4.78 mg/dL — ABNORMAL HIGH (ref 0.44–1.00)
GFR, Estimated: 9 mL/min — ABNORMAL LOW (ref >60)
Glucose, Bld: 210 mg/dL — ABNORMAL HIGH (ref 70–99)
Phosphorus: 5 mg/dL — ABNORMAL HIGH (ref 2.5–4.6)
Potassium: 4.4 mmol/L (ref 3.5–5.1)
Sodium: 139 mmol/L (ref 135–145)

## 2023-11-20 LAB — GLUCOSE, CAPILLARY
Glucose-Capillary: 201 mg/dL — ABNORMAL HIGH (ref 70–99)
Glucose-Capillary: 370 mg/dL — ABNORMAL HIGH (ref 70–99)
Glucose-Capillary: 140 mg/dL — ABNORMAL HIGH (ref 70–99)
Glucose-Capillary: 364 mg/dL — ABNORMAL HIGH (ref 70–99)

## 2023-11-20 LAB — CBC
HCT: 29.2 % — ABNORMAL LOW (ref 36.0–46.0)
Hemoglobin: 9.1 g/dL — ABNORMAL LOW (ref 12.0–15.0)
MCH: 26.5 pg (ref 26.0–34.0)
MCHC: 31.2 g/dL (ref 30.0–36.0)
MCV: 85.1 fL (ref 80.0–100.0)
Platelets: 217 K/uL (ref 150–400)
RBC: 3.43 MIL/uL — ABNORMAL LOW (ref 3.87–5.11)
RDW: 20.5 % — ABNORMAL HIGH (ref 11.5–15.5)
WBC: 19.8 K/uL — ABNORMAL HIGH (ref 4.0–10.5)
nRBC: 4.9 % — ABNORMAL HIGH (ref 0.0–0.2)

## 2023-11-20 MED ORDER — ALBUTEROL SULFATE (2.5 MG/3ML) 0.083% IN NEBU
2.5000 mg | INHALATION_SOLUTION | Freq: Two times a day (BID) | RESPIRATORY_TRACT | Status: DC
  Administered 2023-11-20 – 2023-11-22 (×5): 2.5 mg via RESPIRATORY_TRACT
  Filled 2023-11-20 (×5): qty 3

## 2023-11-20 MED ORDER — CHLORHEXIDINE GLUCONATE CLOTH 2 % EX PADS: 6.0000 | MEDICATED_PAD | Freq: Every day | CUTANEOUS | Status: DC

## 2023-11-20 MED ORDER — HEPARIN SODIUM (PORCINE) 1000 UNIT/ML IJ SOLN
INTRAMUSCULAR | Status: AC
  Filled 2023-11-20: qty 3

## 2023-11-20 MED ORDER — LACTULOSE 10 GM/15ML PO SOLN
20.0000 g | Freq: Two times a day (BID) | ORAL | Status: DC
  Administered 2023-11-20 – 2023-11-21 (×4): 20 g via ORAL
  Filled 2023-11-20 (×5): qty 30

## 2023-11-20 MED ORDER — OMEPRAZOLE 2 MG/ML ORAL SUSPENSION
40.0000 mg | Freq: Every day | ORAL | Status: DC
  Administered 2023-11-20 – 2023-11-23 (×4): 40 mg via ORAL
  Filled 2023-11-20 (×4): qty 20

## 2023-11-20 NOTE — Procedures (Signed)
 HD Note:  Some information was entered later than the data was gathered due to patient care needs. The stated time with the data is accurate.  Received patient from off going dialysis nurse.  Access used: right neck temporary internal jugular HD catheter Access issues: None  Patient was not interactive during the treatment.  She would make eye contact but was not able to respond to any questions.  UF paused. Patient BP lower and unable to communicate if she is having any symptoms.   Goal lowered. See flowsheet  TX duration: 3.5 hours  Alert, without acute distress.  See Flowsheet for UF total  Hand-off given to patient's nurse.   Transported back to the room   Rya Rausch L. Lenon, RN [facility] D[facility].

## 2023-11-20 NOTE — Plan of Care (Signed)
  Problem: Coping: Goal: Ability to adjust to condition or change in health will improve Outcome: Progressing   Problem: Tissue Perfusion: Goal: Adequacy of tissue perfusion will improve Outcome: Progressing   Problem: Education: Goal: Knowledge of General Education information will improve Description: Including pain rating scale, medication(s)/side effects and non-pharmacologic comfort measures Outcome: Progressing

## 2023-11-20 NOTE — Progress Notes (Signed)
 The patient completed HD treatment . A  total of  1.5 liters  of fluid were removed.. Rechecked BP 104/61 mmHg.  11/20/23 1645  Vitals  Temp 98 F (36.7 C)  Temp Source Oral  BP 97/60  BP Location Left Wrist  BP Method Automatic  Patient Position (if appropriate) Lying  During Treatment Monitoring  Intra-Hemodialysis Comments Tx completed  Post Treatment  Dialyzer Clearance Lightly streaked  Hemodialysis Intake (mL) 0 mL  Liters Processed 84  Fluid Removed (mL) 1500 mL  Tolerated HD Treatment Yes  Post-Hemodialysis Comments goal met.  Hemodialysis Catheter Right Internal jugular Triple lumen Temporary (Non-Tunneled)  Placement Date/Time: 11/16/23 1509   Placed prior to admission: No  Serial / Lot #: [id number]  Expiration Date: 11/16/27  Time Out: Correct patient;Correct site;Correct procedure  Maximum sterile barrier precautions: Hand hygiene;Large sterile sheet;C...  Site Condition No complications  Blue Lumen Status Heparin  locked  Red Lumen Status Heparin  locked  Catheter fill solution Heparin  1000 units/ml  Catheter fill volume (Arterial) 1.3 cc  Catheter fill volume (Venous) 1.3  Dressing Type Gauze/Drain sponge;Transparent  Dressing Status Antimicrobial disc/dressing in place  Interventions New dressing  Drainage Description None  Dressing Change Due 11/23/23  Post treatment catheter status Capped and Clamped

## 2023-11-20 NOTE — Progress Notes (Signed)
 [name] NEPHROLOGY PROGRESS NOTE  Assessment/ Plan: Pt is a 68 y.o. yo female  w/ DM with retinopathy and peripheral neuropathy, HTN, PAD with B/L AKA, HLD, CVA, CKD 3A as below who presented to ED sent from SNF due to SOB 11/11/2023.   # AKI on CKD 3b: b/l creat is 1.2- 1.7 from April - June 2025, eGFR 32- 48 ml/min.  She had IV contrast on 6/25 and then had multiple episodes of hypotension.  AKI likely due to ischemic ATN/contrast and hypotension and advanced CHF. The patient was started on dialysis on 7/1 after placement of right IJ temporary HD catheter on 6/30. Mild respiratory distress yesterday but looks better this morning.  We will plan for HD today.  Given advanced CHF not amenable for intervention per cardiology, the patient may not tolerate outpatient dialysis well.  The primary team is considering palliative care discussion.  # BP/volume: UF with HD, on midodrine  for hypotension.  # Anemia: Ferritin level high, hemoglobin stable.  Monitor.  # Sepsis due to pneumonia, acute respiratory failure with hypoxia: Completed 7 days of cefepime .  Subjective: Seen and examined.  [name] any urine output.  No new event at this morning.  Patient is nonverbal.  Discussed with the primary team at the bedside. Objective Vital signs in last 24 hours: Vitals:   11/19/23 1951 11/19/23 2340 11/20/23 0432 11/20/23 0821  BP:  (!) 102/49 (!) 101/35 (!) 142/42  Pulse: 87     Resp: (!) 24     Temp:  98.2 F (36.8 C) 98.3 F (36.8 C) 98.9 F (37.2 C)  TempSrc:  Axillary Axillary Axillary  SpO2:      Weight:   98.9 kg   Height:       Weight change: -9.5 kg  Intake/Output Summary (Last 24 hours) at 11/20/2023 0956 Last data filed at 11/19/2023 1845 Gross per 24 hour  Intake 240 ml  Output 50 ml  Net 190 ml       Labs: RENAL PANEL Recent Labs  Lab 11/16/23 0307 11/17/23 0224 11/18/23 0226 11/19/23 0224 11/20/23 0232  NA 136 138 137 138 139  K 4.5 4.5 4.2 4.3 4.4   CL 105 104 100 102 105  CO2 13* 16* 20* 20* 21*  GLUCOSE 205* 240* 106* 133* 210*  BUN 70* 80* 52* 31* 49*  CREATININE 4.94* 5.94* 4.53* 3.58* 4.78*  CALCIUM  7.6* 7.3* 7.3* 7.8* 7.4*  PHOS 5.8* 6.4* 4.9* 4.7* 5.0*  ALBUMIN  2.5* 2.3* 2.4* 2.4* 2.5*    Liver Function Tests: Recent Labs  Lab 11/18/23 0226 11/19/23 0224 11/20/23 0232  ALBUMIN  2.4* 2.4* 2.5*   No results for input(s): LIPASE, AMYLASE in the last 168 hours. No results for input(s): AMMONIA in the last 168 hours. CBC: Recent Labs    09/09/23 1101 09/10/23 0415 11/15/23 0432 11/15/23 1527 11/16/23 0307 11/17/23 0224 11/18/23 0226 11/19/23 0224 11/20/23 0232  HGB  --    < >  --    < > 9.7* 8.6* 8.5* 9.0* 9.1*  MCV  --    < >  --    < > 86.6 88.9 83.2 86.9 85.1  VITAMINB12 578  --   --   --   --   --   --   --   --   FOLATE 12.8  --   --   --   --   --   --   --   --   FERRITIN 275  --  1,093*  --   --   --   --   --   --   TIBC 209*  --  192*  --   --   --   --   --   --   IRON  24*  --  54  --   --   --   --   --   --   RETICCTPCT 1.3  --   --   --   --   --   --   --   --    < > = values in this interval not displayed.    Cardiac Enzymes: No results for input(s): CKTOTAL, CKMB, CKMBINDEX, TROPONINI in the last 168 hours. CBG: Recent Labs  Lab 11/19/23 1116 11/19/23 1529 11/19/23 1654 11/19/23 2103 11/20/23 0608  GLUCAP 168* 379* 362* 230* 201*    Iron  Studies: No results for input(s): IRON , TIBC, TRANSFERRIN, FERRITIN in the last 72 hours. Studies/Results: DG CHEST PORT 1 VIEW Result Date: 11/19/2023 CLINICAL DATA:  [id number] Dyspnea [id number] EXAM: PORTABLE CHEST 1 VIEW COMPARISON:  Chest x-ray 11/16/2023 FINDINGS: Right internal jugular central venous catheter with tip overlying the right atrium. The heart and mediastinal contours are unchanged. Atherosclerotic plaque. Low lung volumes. Limited evaluation of the right apex due to overlying mandible. No focal consolidation. No  pulmonary edema. Likely bilateral trace pleural effusions. No pneumothorax. No acute osseous abnormality. IMPRESSION: 1. Likely bilateral trace pleural effusions. Consider repeat PA and lateral view of the chest with improved inspiratory effort. 2. Persistent cardiomegaly. 3.  Aortic Atherosclerosis (ICD10-I70.0). Electronically Signed   By: Morgane  Naveau M.D.   On: 11/19/2023 17:14   DG Swallowing Func-Speech Pathology Result Date: 11/19/2023 Table formatting from the original result was not included. Modified Barium Swallow Study Patient Details Name: [name] MRN: [id number] Date of Birth: Nov 24, 1955 Today's Date: 11/19/2023 HPI/PMH: HPI: [name] is a 68 yo female presenting to ED 6/25 with SOB after recent admission for NSTEMI (not a candidate for CABG or PCI). Admitted with sepsis and CAP. CT Chest shows extensive patchy and ground-glass opacity in both lungs with progression, compatible with worsening pulmonary edema and possible PNA. During HD cath placement 6/30, there was a sudden change to nonverbal and subsequent MRI showed R frontal CVA. Failed swallow screen 7/1 but unable to coordinate evaluation due to intermittent BiPAP use and HD. Previously seen by SLP in 2013 and 2014 with a functional appearing swallow and memory deficits. PMH includes SOB, NSTEMI, prior CVA, CKD 3A, bil BKA, HTN, DM, IBS, anemia, and diabetic retinopathy Clinical Impression: Clinical Impression: Pt has oral more than pharyngeal deficits, including incomplete labial seal and reduced lingual control/coordination. Mastication is incomplete with small, softened bite of cracker. Her lingual propulsion is brisk and her swallow initiates very timely, but there is some premature loss especially with liquids that results in trace penetration of thin and nectar thick liquids (PAS 2-3) and at least one instance of aspiration of thin liquids (PAS 8). Visibility of the cords was somewhat limited and there were other times  during the study when pt did cough, but aspiration could not be visualized. Pt is not able to implement compensatory strategies herself at this time due to current cognitive-linguisitc abilities. Recommend starting with Dys 1 (puree) diet and nectar thick liquids. Factors that may increase risk of adverse event in presence of aspiration Noe & Lianne 2021): Factors that may increase risk of adverse event in presence of aspiration Noe &  Lianne 2021): Reduced cognitive function; Limited mobility; Dependence for feeding and/or oral hygiene Recommendations/Plan: Swallowing Evaluation Recommendations Swallowing Evaluation Recommendations Recommendations: PO diet PO Diet Recommendation: Dysphagia 1 (Pureed); Mildly thick liquids (Level 2, nectar thick) Liquid Administration via: Cup; Straw Medication Administration: Crushed with puree Supervision: Staff to assist with self-feeding; Full supervision/cueing for swallowing strategies Swallowing strategies  : Minimize environmental distractions; Slow rate; Small bites/sips; Follow solids with liquids Postural changes: Position pt fully upright for meals; Stay upright 30-60 min after meals Oral care recommendations: Oral care BID (2x/day) Treatment Plan Treatment Plan Treatment recommendations: Therapy as outlined in treatment plan below Follow-up recommendations: Skilled nursing-short term rehab (<3 hours/day) Functional status assessment: Patient has had a recent decline in their functional status and demonstrates the ability to make significant improvements in function in a reasonable and predictable amount of time. Treatment frequency: Min 2x/week Treatment duration: 2 weeks Interventions: Aspiration precaution training; Patient/family education; Trials of upgraded texture/liquids; Diet toleration management by SLP Recommendations Recommendations for follow up therapy are one component of a multi-disciplinary discharge planning process, led by the attending  physician.  Recommendations may be updated based on patient status, additional functional criteria and insurance authorization. Assessment: Orofacial Exam: Orofacial Exam Oral Cavity - Dentition: Adequate natural dentition Anatomy: No data recorded Boluses Administered: No data recorded Oral Impairment Domain: Oral Impairment Domain Lip Closure: Escape beyond mid-chin Tongue control during bolus hold: Posterior escape of greater than half of bolus (does not hold - aphasia) Bolus preparation/mastication: Disorganized chewing/mashing with solid pieces of bolus unchewed Bolus transport/lingual motion: Brisk tongue motion Oral residue: Trace residue lining oral structures Location of oral residue : Tongue Initiation of pharyngeal swallow : Pyriform sinuses  Pharyngeal Impairment Domain: Pharyngeal Impairment Domain Soft palate elevation: No bolus between soft palate (SP)/pharyngeal wall (PW) (does have some escape into nasopharynx, but cleared by the time of maximum displacement) Laryngeal elevation: Complete superior movement of thyroid  cartilage with complete approximation of arytenoids to epiglottic petiole Anterior hyoid excursion: Partial anterior movement Epiglottic movement: Complete inversion Laryngeal vestibule closure: Complete, no air/contrast in laryngeal vestibule Pharyngeal stripping wave : Present - complete Pharyngeal contraction (A/P view only): N/A Pharyngoesophageal segment opening: Complete distension and complete duration, no obstruction of flow Tongue base retraction: No contrast between tongue base and posterior pharyngeal wall (PPW) Pharyngeal residue: Complete pharyngeal clearance Location of pharyngeal residue: N/A  Esophageal Impairment Domain: Esophageal Impairment Domain Esophageal clearance upright position: -- (full esophageal sweep not done but did see some retention in proximal esophagus during trials) Pill: No data recorded Penetration/Aspiration Scale Score: Penetration/Aspiration  Scale Score 1.  Material does not enter airway: Moderately thick liquids (Level 3, honey thick); Puree; Solid 3.  Material enters airway, remains ABOVE vocal cords and not ejected out: Mildly thick liquids (Level 2, nectar thick) 8.  Material enters airway, passes BELOW cords without attempt by patient to eject out (silent aspiration) : Thin liquids (Level 0) Compensatory Strategies: No data recorded  General Information: Caregiver present: No  Diet Prior to this Study: NPO   Temperature : Normal   Respiratory Status: Tachypneic   Supplemental O2: Nasal cannula   History of Recent Intubation: No  Behavior/Cognition: Alert; Cooperative; Doesn't follow directions Self-Feeding Abilities: Needs hand-over-hand assist for feeding Baseline vocal quality/speech: Not observed No data recorded Volitional Swallow: Unable to elicit Exam Limitations: Limited visibility Goal Planning: Prognosis for improved oropharyngeal function: Fair Barriers to Reach Goals: Language deficits No data recorded Patient/Family Stated Goal: difficulty stating Consulted and agree with results  and recommendations: Pt unable/family or caregiver not available Pain: Pain Assessment Pain Assessment: Faces Faces Pain Scale: 0 Pain Intervention(s): Monitored during session End of Session: Start Time:SLP Start Time (ACUTE ONLY): 0901 Stop Time: SLP Stop Time (ACUTE ONLY): 9081 Time Calculation:SLP Time Calculation (min) (ACUTE ONLY): 17 min Charges: SLP Evaluations $ SLP Speech Visit: 1 Visit SLP Evaluations $BSS Swallow: 1 Procedure $MBS Swallow: 1 Procedure $ SLP EVAL LANGUAGE/SOUND PRODUCTION: 1 Procedure SLP visit diagnosis: SLP Visit Diagnosis: Dysphagia, oropharyngeal phase (R13.12) Past Medical History: Past Medical History: Diagnosis Date  Atypical chest pain   a. non obstructive by cath in 2008 and 2010;  b. 02/2012 Myoview : non-ischemic, EF 57%  Cellulitis   a. left foot-> s/p L BKA  Chronic kidney disease   Constipation   CVA (cerebral  infarction)   a.  Small right parietal noted incidentally 04/2007;  b. right sided embolic CVA 05/2012;  c. 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 near-syncopal episode in November 2008;  b. 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  Tobacco abuse   Vertigo  Past Surgical History: Past Surgical History: Procedure Laterality Date  AMPUTATION  12/30/2011  Procedure: AMPUTATION RAY;  Surgeon: Norleen Armor, MD;  Location: [facility] OR;  Service: Orthopedics;  Laterality: Left;  LEFT FIRST RAY AMPUTATION  AMPUTATION  01/13/2012  Procedure: AMPUTATION BELOW KNEE;  Surgeon: Norleen Armor, MD;  Location: [facility] OR;  Service: Orthopedics;  Laterality: Left;  AMPUTATION  03/04/2012  Procedure: AMPUTATION BELOW KNEE;  Surgeon: Norleen Armor, MD;  Location: [facility] OR;  Service: Orthopedics;  Laterality: Left;  Revision of Left Below Knee Amputation  AMPUTATION Right 11/02/2013  Procedure: AMPUTATION BELOW KNEE;  Surgeon: Jerona Harden GAILS, MD;  Location: [facility] OR;  Service: Orthopedics;  Laterality: Right;  Right Below Knee Amputation  BIOPSY  03/29/2019  Procedure: BIOPSY;  Surgeon: San Sandor GAILS, DO;  Location: [facility] ENDOSCOPY;  Service: Gastroenterology;;  EGD and Colon  CARDIAC CATHETERIZATION    LAD 30%, circumflex 50%, OM 75%, RI 60% with small branch 80%, dominant RCA 60%, EF 45-50%  CHOLECYSTECTOMY    COLONOSCOPY    COLONOSCOPY WITH PROPOFOL  N/A 03/29/2019  Procedure: COLONOSCOPY WITH PROPOFOL ;  Surgeon: San Sandor GAILS,  DO;  Location: [facility] ENDOSCOPY;  Service: Gastroenterology;  Laterality: N/A;  ESOPHAGOGASTRODUODENOSCOPY (EGD) WITH PROPOFOL  N/A 03/29/2019  Procedure: ESOPHAGOGASTRODUODENOSCOPY (EGD) WITH PROPOFOL ;  Surgeon: San Sandor GAILS, DO;  Location: [facility] ENDOSCOPY;  Service: Gastroenterology;  Laterality: N/A;  EYE SURGERY Right   cataract  I & D EXTREMITY  12/23/2011  Procedure: IRRIGATION AND DEBRIDEMENT EXTREMITY;  Surgeon: Elspeth JONELLE Her, MD;  Location: [facility] OR;  Service: Orthopedics;  Laterality: Left;  Left Foot  I & D EXTREMITY  12/26/2011  Procedure: IRRIGATION AND DEBRIDEMENT EXTREMITY;  Surgeon: Norleen Armor, MD;  Location: [facility] OR;  Service: Orthopedics;  Laterality: Left;  LEFT FOOT I&D WITH POSSIBLE WOUND VAC APPLICATION, POSSIBLE  LEFT FIRST RAY AMPUTATION  I & D EXTREMITY Right 10/29/2013  Procedure: IRRIGATION AND DEBRIDEMENT EXTREMITY;  Surgeon: Reyes JAYSON Billing, MD;  Location: [facility] OR;  Service: Orthopedics;  Laterality: Right;  Invasive Electrophysiologic Study  5/09  followed by insertion of an implantable loop recorder. S/p removal   IR FLUORO GUIDE CV LINE RIGHT  11/16/2023  IR US  GUIDE VASC ACCESS RIGHT  11/16/2023  Multiple Toe Surgeries    POLYPECTOMY  03/29/2019  Procedure: POLYPECTOMY;  Surgeon: San Sandor GAILS, DO;  Location: [facility] ENDOSCOPY;  Service: Gastroenterology;;  RIGHT/LEFT HEART CATH AND CORONARY ANGIOGRAPHY N/A 09/07/2023  Procedure: RIGHT/LEFT HEART CATH AND CORONARY ANGIOGRAPHY;  Surgeon: Anner Alm ORN, MD;  Location: [facility] INVASIVE CV LAB;  Service: Cardiovascular;  Laterality: N/A;  TEE WITHOUT CARDIOVERSION N/A 07/07/2012  Procedure: TRANSESOPHAGEAL ECHOCARDIOGRAM (TEE);  Surgeon: Redell GORMAN Shallow, MD;  Location: [facility] ENDOSCOPY;  Service: Cardiovascular;  Laterality: N/A; Leita SAILOR., M.A. CCC-SLP Acute Rehabilitation Services Office: [phone] Secure chat preferred 11/19/2023, 9:57 AM  VAS US  CAROTID Result Date: 11/18/2023 Carotid Arterial Duplex Study Patient Name:  YAZLYN WENTZEL  Date of  Exam:   11/18/2023 Medical Rec #: [id number]         Accession #:    [id number] Date of Birth: [birth year]          Patient Gender: F Patient Age:   63 years Exam Location:  [facility] Procedure:      VAS US  CAROTID Referring Phys: ELLOUISE [facility] --------------------------------------------------------------------------------  Indications:       CVA and SOB. Risk Factors:      Hypertension, hyperlipidemia, Diabetes, PAD. Other Factors:     Bilateral BKA. Limitations        Today's exam was limited due to the body habitus of the                    patient and temporary hemodialysis catheter placed right                    lower neck and coughing throughout the exam. Comparison Study:  No recent carotid studies. Performing Technologist: Ricka Sturdivant-Jones RDMS, RVT  Examination Guidelines: A complete evaluation includes B-mode imaging, spectral Doppler, color Doppler, and power Doppler as needed of all accessible portions of each vessel. Bilateral testing is considered an integral part of a complete examination. Limited examinations for reoccurring indications may be performed as noted.  Right Carotid Findings: +----------+--------+--------+--------+------------------+------------------+           PSV cm/sEDV cm/sStenosisPlaque DescriptionComments           +----------+--------+--------+--------+------------------+------------------+ CCA Prox  52      18                                                   +----------+--------+--------+--------+------------------+------------------+ CCA Distal54      14                                intimal thickening +----------+--------+--------+--------+------------------+------------------+ ICA Prox  100     29      1-39%   calcific                             +----------+--------+--------+--------+------------------+------------------+ ICA Distal58  25                                                    +----------+--------+--------+--------+------------------+------------------+ ECA       107                                                          +----------+--------+--------+--------+------------------+------------------+ +----------+--------+-------+------------+-------------------+           PSV cm/sEDV cmsDescribe    Arm Pressure (mmHG) +----------+--------+-------+------------+-------------------+ Subclavian               Not assessed                    +----------+--------+-------+------------+-------------------+ +---------+--------+--------+------------+ VertebralPSV cm/sEDV cm/sNot assessed +---------+--------+--------+------------+  Left Carotid Findings: +----------+--------+--------+--------+------------------+------------------+           PSV cm/sEDV cm/sStenosisPlaque DescriptionComments           +----------+--------+--------+--------+------------------+------------------+ CCA Prox  55      10                                intimal thickening +----------+--------+--------+--------+------------------+------------------+ CCA Mid                                             intimal thickening +----------+--------+--------+--------+------------------+------------------+ CCA Distal50      17                                intimal thickening +----------+--------+--------+--------+------------------+------------------+ ICA Prox  55      15      1-39%                     intimal thickening +----------+--------+--------+--------+------------------+------------------+ ICA Distal82      25                                                   +----------+--------+--------+--------+------------------+------------------+ ECA       59      12                                                   +----------+--------+--------+--------+------------------+------------------+ +----------+--------+--------+----------------+-------------------+            PSV cm/sEDV cm/sDescribe        Arm Pressure (mmHG) +----------+--------+--------+----------------+-------------------+ [id number]             Multiphasic, WNL                    +----------+--------+--------+----------------+-------------------+ +---------+--------+--+--------+--+---------+ VertebralPSV cm/s46EDV cm/s14Antegrade +---------+--------+--+--------+--+---------+   Summary: Right Carotid: Velocities in the right ICA are consistent with a 1-39% stenosis. Left Carotid: Velocities in the left ICA  are consistent with a 1-39% stenosis.  *See table(s) above for measurements and observations.  Electronically signed by Gaile New MD on 11/18/2023 at 9:56:07 PM.    Final     Medications: Infusions:   Scheduled Medications:   stroke: early stages of recovery book   Does not apply Once   albuterol   2.5 mg Nebulization BID   atorvastatin   80 mg Oral Daily   Chlorhexidine  Gluconate Cloth  6 each Topical Q0600   clopidogrel   75 mg Oral Daily   heparin   5,000 Units Subcutaneous Q8H   insulin  aspart  0-15 Units Subcutaneous TID WC   insulin  aspart  0-5 Units Subcutaneous QHS   insulin  glargine-yfgn  9 Units Subcutaneous Daily   lactulose   20 g Oral BID   midodrine   2.5 mg Oral TID WC   omeprazole   40 mg Oral Daily   sodium chloride  flush  3 mL Intravenous Q12H   thiamine   100 mg Oral Daily    have reviewed scheduled and prn medications.  Physical Exam: General:NAD, comfortable Heart:RRR, s1s2 nl Lungs:clear b/l, no crackle Abdomen:soft, Non-tender, non-distended Extremities: Trace peripheral edema present Dialysis Access: Right IJ temporary HD catheter  Omeed Osuna Prasad Caleigha Zale 11/20/2023,9:56 AM  LOS: 9 days

## 2023-11-20 NOTE — Progress Notes (Signed)
 Daily Progress Note   Patient Name: [name]       Date: 11/20/2023 DOB: 1956-03-16  Age: 68 y.o. MRN#: [id number] Attending Physician: Fairy Frames, MD Primary Care Physician: Arnaez Zapata, Gerardo E, MD Admit Date: 11/11/2023  Reason for Consultation/Follow-up: Establishing goals of care  Length of Stay: 9  Current Medications: Scheduled Meds:    stroke: early stages of recovery book   Does not apply Once   albuterol   2.5 mg Nebulization BID   atorvastatin   80 mg Oral Daily   Chlorhexidine  Gluconate Cloth  6 each Topical Q0600   clopidogrel   75 mg Oral Daily   heparin   5,000 Units Subcutaneous Q8H   insulin  aspart  0-15 Units Subcutaneous TID WC   insulin  aspart  0-5 Units Subcutaneous QHS   insulin  glargine-yfgn  9 Units Subcutaneous Daily   lactulose   20 g Oral BID   midodrine   2.5 mg Oral TID WC   omeprazole   40 mg Oral Daily   sodium chloride  flush  3 mL Intravenous Q12H   thiamine   100 mg Oral Daily    Continuous Infusions:   PRN Meds: acetaminophen  **OR** acetaminophen , HYDROmorphone  (DILAUDID ) injection, ipratropium-albuterol , ondansetron  **OR** ondansetron  (ZOFRAN ) IV  Physical Exam Vitals reviewed.  Constitutional:      General: She is not in acute distress. HENT:     Head: Normocephalic and atraumatic.  Cardiovascular:     Rate and Rhythm: Normal rate.  Pulmonary:     Effort: Pulmonary effort is normal.  Musculoskeletal:     Comments: Bilateral AKA  Neurological:     Mental Status: She is alert.  Psychiatric:        Behavior: Behavior normal.             Vital Signs: BP (!) 142/42 (BP Location: Right Wrist)   Pulse 87   Temp 98.9 F (37.2 C) (Axillary)   Resp (!) 24   Ht 5' 6 (1.676 m)   Wt 98.9 kg   SpO2 100%   BMI 35.19 kg/m   SpO2: SpO2: 100 % O2 Device: O2 Device: Nasal Cannula O2 Flow Rate: O2 Flow Rate (L/min): 4 L/min        Palliative Assessment/Data:30%      Patient Active Problem List   Diagnosis Date Noted   Lacunar infarct, acute (  HCC) 11/17/2023   PAF (paroxysmal atrial fibrillation) (HCC) 11/12/2023   Severe sepsis due to community acquired pneumonia (HCC) 11/11/2023   Pressure injury of skin 09/14/2023   Ischemic cardiomyopathy 09/14/2023   Acute renal failure superimposed on stage 3a chronic kidney disease (HCC) 09/08/2023   Leukocytosis 09/07/2023   Chronic diastolic CHF (congestive heart failure) (HCC) 09/07/2023   Type 2 diabetes mellitus with hyperlipidemia (HCC) 09/07/2023   Chronic iron  deficiency anemia 09/07/2023   Acute on chronic combined systolic and diastolic CHF (congestive heart failure) (HCC) 09/07/2023   Demand ischemia due to type 2 NSTEMI (HCC) 09/06/2023   Heme positive stool    Rectal polyp    Adenomatous polyp of transverse colon    Gastritis and gastroduodenitis    Phantom limb pain (HCC) 11/09/2017   Other abnormalities of gait and mobility 11/09/2017   S/P BKA (below knee amputation) bilateral (HCC) 08/20/2015   Dental caries 08/20/2015   Constipation 08/15/2015   Depression 08/15/2015   Pain in shoulder 06/28/2015   Hemiparesis (HCC) 01/10/2013   Tobacco abuse    Paresthesia of right arm and leg 12/17/2011   Facial droop 12/15/2011   TIA (transient ischemic attack) 12/15/2011   Syncope 07/02/2011   Lower urinary tract infectious disease 07/02/2011   Normocytic anemia 07/02/2011   Hereditary and idiopathic peripheral neuropathy 04/02/2009   Essential hypertension 04/02/2009   Coronary artery disease involving native coronary artery of native heart without angina pectoris 04/02/2009   HIATAL HERNIA 04/02/2009   Irritable bowel syndrome 04/02/2009   HLD (hyperlipidemia) 05/30/2008   Class 2 obesity 05/30/2008   Cerebrovascular disease 05/30/2008    CVA (cerebral infarction) 05/30/2008   GERD 05/30/2008    Palliative Care Assessment & Plan   Patient Profile: 68 y.o. F with obesity, sCHF EF 20-25%, recent NSTEMI treated medically, DM and hx bilat BKA, HTN, CKD IIIa baseline 1.2 and hx stroke without residual who presented with SOB. The Palliative care team has been asked to support additional goals of care conversations.   Today's Discussion: Patient sitting up in bed in NAD. When I asked her if she was having pain/ discomfort she moved her eyes indicating no. When I asked if she remembered meeting with me for a family meeting in April she moved her eyes indicating yes. She also indicated her niece Alissa is her HCPOA. I shared that I planned to call Tameika to set up another family meeting to discuss her goals of care-- she moved her eyes in agreement.  Called patient's niece/HCPOA Alissa Pouch. Alissa shared that she believes her family are beginning to have a better understanding of the patient's declining functional status, chronic conditions, and new acute concerns. We agree to meet tomorrow afternoon at 3:00pm to discuss goals of care. Encouraged her to call PMT with questions or concerns.   Recommendations/Plan: Full code Full scope Continue present care Family meeting scheduled Saturday 11/21/23 at 3:00 pm in patient's room Continued PMT support   Code Status:    Code Status Orders  (From admission, onward)           Start     Ordered   11/11/23 1841  Full code  Continuous       Question:  By:  Answer:  Other   11/11/23 1843         Extensive chart review has been completed prior to seeing the patient including labs, vital signs, imaging, progress/consult notes, orders, medications, and available advance directive documents.  Care plan was  discussed with Dr. Fairy  Time spent: 35 minutes  Thank you for allowing the Palliative Medicine Team to assist in the care of this patient.    Stephane CHRISTELLA Palin,  NP  Please contact Palliative Medicine Team phone at [phone] for questions and concerns.

## 2023-11-20 NOTE — Progress Notes (Signed)
 PROGRESS NOTE    [name]  FMW:[id number] DOB: Jul 06, 1955 DOA: 11/11/2023 PCP: Ellaree Hansel [name], MD   68 y.o. F with obesity, sCHF EF 20-25%, recent NSTEMI treated medically, DM and hx bilat BKA, HTN, CKD IIIa baseline 1.2 and hx stroke without residual who presented with SOB.  -Recently admitted for NSTEMI/CHF, underwent LHC that showed severe calcific disease, 80% mid-LAD, 90% prox RCA and 90% ramus intermedius, no PCI due to technically challenging targets, not CABG candidate.  EF 20-25%. -Recovered afterwards and was feeling well, until a day or two before admission, became SOB, sent from facility to hospital.    -In the ER, CXR showed bilateral opacities. Lactate >5.  New atrial fibrillation.  Suspected CHF, pneumonia sepsis, started on antibiotics, diuretics, Cardiology consulted.  -Subsequent hospital course complicated by renal failure.  CVC placed 6/30, initiated dialysis 7/1. -6/30 after temporary HD catheter was placed patient was noted to be nonverbal, MRI obtained noted 5 mm stroke in right frontal lobe, EEG negative - Palliative care following -7/2: Abx stopped, 7days completed -7/3, respiratory distress, stabilized after pulmonary toilet    Subjective: Pt seen, aphasic, no events overnight  Assessment and Plan:  Severe sepsis due to community acquired pneumonia (HCC) Acute respiratory failure with hypoxia Presented with tachycardia, tachypnea, leukocytosis, and respiratory failure with lactic acid greater than 5.  Chest x-ray shows bilateral infiltrates.  Procalcitonin 0.6, trended up to >2. -  completed 7 days of Abx 7/2 - Bronchodilators, pulmonary toilet - Continue midodrine   Acute renal failure superimposed on stage 3a chronic kidney disease (HCC) Baseline creatinine appears to be around 1.2 until her recent admission for CHF and NSTEMI, -now developed oliguric renal failure, likely ischemic ATN in the setting of IV contrast, sepsis, vancomycin ,  hypotension on 6/27 and diuresis for CHF. -Renal US  unremarkable.   -Vanc stopped, creatinine continued trending up. Nephrology consulted, HD catheter placed started hemodialysis 7/1 -Nephrology and palliative care following -poor candidate for longterm HD, called and updated niece and recommended consideration of Hospice, palliative care following, plan for meeting tomorrow  Acute on chronic combined systolic and diastolic CHF (congestive heart failure) [facility]) -  Cardiology consulted have since signed off.  EF 20-25% with advanced CAD not amenable to intervention. - Now complicated by AKI started HD, GDMT limited by hypotension -Dr.Danford d/w advanced heart failure team again on 7/1, given multiorgan failure, they do not recommend any additional interventions at this time  Acute CVA, lacunar infarct Acute metabolic encephalopathy At baseline, patient has no known cognitive impairment.   - On 6/30 noted to be aphasic, less responsive  -MRI brain was obtained that showed small lacunar infarct.  In right frontal lobe  -Consensus from neurology is that infarct likely incidental. -EEG negative for seizures, noted moderate diffuse encephalopathy  -Neurology recommended aspirin  and Plavix  for 3 weeks followed by Plavix  alone - Follow-up carotid duplex, LDL is 61, A1c 7 - PT OT following  Chronic iron  deficiency anemia Hemoglobin trending down in the setting of renal failure and phlebotomy. No clinical bleeding observed or reported. - Follow Hgb   Type 2 diabetes mellitus with hyperlipidemia (HCC) Remains somewhat hyperglycemic - Hold metformin , GLP-1 - Increase glargine, SSI  Essential hypertension Cerebrovascular disease Hypotensive earlier in hospital stay, currently stable on midodrine  - Continue midodrine  - Holding Lasix , Isordil , hydralazine , metoprolol    HLD (hyperlipidemia) - Continue Lipitor   S/P BKA (below knee amputation) bilateral (HCC)   PAF (paroxysmal atrial  fibrillation) (HCC) Previous ILR ~10 yrs  ago showed no Afib.  On admission this time, transient Afib, now resolved.  In setting of very brief transient episode during sepsis, will defer [facility] for now. - Monitor on tele  Demand ischemia due to type 2 NSTEMI [facility]) Severe calcific coronary artery disease with chronic angina Evaluated by Cardiology.  They suspected type 2 NSTEMI, not ACS.   - Continue Lipitor , Plavix   Class 2 obesity BMI 38, morbid obesity in the setting of comorbid diabetes, hypertension  DVT prophylaxis: hep SQ Code Status: Full Code Family Communication: none present, called and updated neice yesterday Disposition Plan: TBD  Consultants:    Procedures:   Antimicrobials:    Objective: Vitals:   11/19/23 1951 11/19/23 2340 11/20/23 0432 11/20/23 0821  BP:  (!) 102/49 (!) 101/35 (!) 142/42  Pulse: 87     Resp: (!) 24     Temp:  98.2 F (36.8 C) 98.3 F (36.8 C) 98.9 F (37.2 C)  TempSrc:  Axillary Axillary Axillary  SpO2:      Weight:   98.9 kg   Height:        Intake/Output Summary (Last 24 hours) at 11/20/2023 1056 Last data filed at 11/19/2023 1845 Gross per 24 hour  Intake 240 ml  Output 50 ml  Net 190 ml   Filed Weights   11/18/23 1205 11/19/23 0447 11/20/23 0432  Weight: 106.8 kg 103 kg 98.9 kg    Examination:  General exam: Obese chronically ill female sitting up in bed, awake alert, unable to assess orientation, nonverbal, follows commands  HEENT: Neck obese unable to assess JVD, right IJ central line, HD cath CVS: S1-S2, regular rhythm Lungs: Few scattered rhonchi Abdomen: Soft, nontender, bowel sounds present Extremities: Bilateral BKA Neuro: Awake and alert, nonverbal, no other localizing signs Skin: No rashes Psychiatry: Flat affect    Data Reviewed:   CBC: Recent Labs  Lab 11/16/23 0307 11/17/23 0224 11/18/23 0226 11/19/23 0224 11/20/23 0232  WBC 16.4* 14.4* 16.4* 16.5* 19.8*  HGB 9.7* 8.6* 8.5* 9.0* 9.1*  HCT 32.9*  28.9* 26.8* 29.3* 29.2*  MCV 86.6 88.9 83.2 86.9 85.1  PLT 220 201 229 230 217   Basic Metabolic Panel: Recent Labs  Lab 11/16/23 0307 11/17/23 0224 11/18/23 0226 11/19/23 0224 11/20/23 0232  NA 136 138 137 138 139  K 4.5 4.5 4.2 4.3 4.4  CL 105 104 100 102 105  CO2 13* 16* 20* 20* 21*  GLUCOSE 205* 240* 106* 133* 210*  BUN 70* 80* 52* 31* 49*  CREATININE 4.94* 5.94* 4.53* 3.58* 4.78*  CALCIUM  7.6* 7.3* 7.3* 7.8* 7.4*  PHOS 5.8* 6.4* 4.9* 4.7* 5.0*   GFR: Estimated Creatinine Clearance: 13.2 mL/min (A) (by C-G formula based on SCr of 4.78 mg/dL (H)). Liver Function Tests: Recent Labs  Lab 11/16/23 0307 11/17/23 0224 11/18/23 0226 11/19/23 0224 11/20/23 0232  ALBUMIN  2.5* 2.3* 2.4* 2.4* 2.5*   No results for input(s): LIPASE, AMYLASE in the last 168 hours. No results for input(s): AMMONIA in the last 168 hours. Coagulation Profile: No results for input(s): INR, PROTIME in the last 168 hours. Cardiac Enzymes: No results for input(s): CKTOTAL, CKMB, CKMBINDEX, TROPONINI in the last 168 hours. BNP (last 3 results) No results for input(s): PROBNP in the last 8760 hours. HbA1C: No results for input(s): HGBA1C in the last 72 hours.  CBG: Recent Labs  Lab 11/19/23 1116 11/19/23 1529 11/19/23 1654 11/19/23 2103 11/20/23 0608  GLUCAP 168* 379* 362* 230* 201*   Lipid Profile: No results for  input(s): CHOL, HDL, LDLCALC, TRIG, CHOLHDL, LDLDIRECT in the last 72 hours.  Thyroid  Function Tests: No results for input(s): TSH, T4TOTAL, FREET4, T3FREE, THYROIDAB in the last 72 hours. Anemia Panel: No results for input(s): VITAMINB12, FOLATE, FERRITIN, TIBC, IRON , RETICCTPCT in the last 72 hours. Urine analysis:    Component Value Date/Time   COLORURINE YELLOW 11/14/2023 1641   APPEARANCEUR CLOUDY (A) 11/14/2023 1641   LABSPEC >1.030 (H) 11/14/2023 1641   PHURINE 5.0 11/14/2023 1641   GLUCOSEU 100 (A) 11/14/2023  1641   HGBUR SMALL (A) 11/14/2023 1641   BILIRUBINUR NEGATIVE 11/14/2023 1641   KETONESUR 15 (A) 11/14/2023 1641   PROTEINUR >=300 11/14/2023 1641   UROBILINOGEN 0.2 12/31/2014 1928   NITRITE NEGATIVE 11/14/2023 1641   LEUKOCYTESUR SMALL (A) 11/14/2023 1641   Sepsis Labs: @LABRCNTIP (procalcitonin:4,lacticidven:4)  ) Recent Results (from the past 240 hours)  Blood culture (routine x 2)     Status: None   Collection Time: 11/11/23  1:08 PM   Specimen: BLOOD RIGHT FOREARM  Result Value Ref Range Status   Specimen Description BLOOD RIGHT FOREARM  Final   Special Requests   Final    BOTTLES DRAWN AEROBIC ONLY Blood Culture adequate volume   Culture   Final    NO GROWTH 5 DAYS Performed at [facility], [address], [place], [place] [address]    Report Status 11/16/2023 FINAL  Final  Blood culture (routine x 2)     Status: None   Collection Time: 11/11/23  3:05 PM   Specimen: BLOOD LEFT HAND  Result Value Ref Range Status   Specimen Description BLOOD LEFT HAND  Final   Special Requests   Final    BOTTLES DRAWN AEROBIC AND ANAEROBIC Blood Culture adequate volume   Culture   Final    NO GROWTH 5 DAYS Performed at [facility], [address] [place], [place] [address]    Report Status 11/16/2023 FINAL  Final  MRSA Next Gen by PCR, Nasal     Status: None   Collection Time: 11/13/23  6:51 AM   Specimen: Nasal Mucosa; Nasal Swab  Result Value Ref Range Status   MRSA by PCR Next Gen NOT DETECTED NOT DETECTED Final    Comment: (NOTE) The GeneXpert MRSA Assay (FDA approved for NASAL specimens only), is one component of a comprehensive MRSA colonization surveillance program. It is not intended to diagnose MRSA infection nor to guide or monitor treatment for MRSA infections. Test performance is not FDA approved in patients less than 14 years old. Performed at [facility], [address], [place], [place] [address]      Radiology Studies: DG CHEST PORT 1  VIEW Result Date: 11/19/2023 CLINICAL DATA:  [id number] Dyspnea [id number] EXAM: PORTABLE CHEST 1 VIEW COMPARISON:  Chest x-ray 11/16/2023 FINDINGS: Right internal jugular central venous catheter with tip overlying the right atrium. The heart and mediastinal contours are unchanged. Atherosclerotic plaque. Low lung volumes. Limited evaluation of the right apex due to overlying mandible. No focal consolidation. No pulmonary edema. Likely bilateral trace pleural effusions. No pneumothorax. No acute osseous abnormality. IMPRESSION: 1. Likely bilateral trace pleural effusions. Consider repeat PA and lateral view of the chest with improved inspiratory effort. 2. Persistent cardiomegaly. 3.  Aortic Atherosclerosis (ICD10-I70.0). Electronically Signed   By: Morgane  Naveau M.D.   On: 11/19/2023 17:14   DG Swallowing Func-Speech Pathology Result Date: 11/19/2023 Table formatting from the original result was not included. Modified Barium Swallow Study Patient  Details Name: CHONG JANUARY MRN: [id number] Date of Birth: 1955-08-15 Today's Date: 11/19/2023 HPI/PMH: HPI: [name] is a 68 yo female presenting to ED 6/25 with SOB after recent admission for NSTEMI (not a candidate for CABG or PCI). Admitted with sepsis and CAP. CT Chest shows extensive patchy and ground-glass opacity in both lungs with progression, compatible with worsening pulmonary edema and possible PNA. During HD cath placement 6/30, there was a sudden change to nonverbal and subsequent MRI showed R frontal CVA. Failed swallow screen 7/1 but unable to coordinate evaluation due to intermittent BiPAP use and HD. Previously seen by SLP in 2013 and 2014 with a functional appearing swallow and memory deficits. PMH includes SOB, NSTEMI, prior CVA, CKD 3A, bil BKA, HTN, DM, IBS, anemia, and diabetic retinopathy Clinical Impression: Clinical Impression: Pt has oral more than pharyngeal deficits, including incomplete labial seal and reduced lingual control/coordination.  Mastication is incomplete with small, softened bite of cracker. Her lingual propulsion is brisk and her swallow initiates very timely, but there is some premature loss especially with liquids that results in trace penetration of thin and nectar thick liquids (PAS 2-3) and at least one instance of aspiration of thin liquids (PAS 8). Visibility of the cords was somewhat limited and there were other times during the study when pt did cough, but aspiration could not be visualized. Pt is not able to implement compensatory strategies herself at this time due to current cognitive-linguisitc abilities. Recommend starting with Dys 1 (puree) diet and nectar thick liquids. Factors that may increase risk of adverse event in presence of aspiration Noe & Lianne 2021): Factors that may increase risk of adverse event in presence of aspiration Noe & Lianne 2021): Reduced cognitive function; Limited mobility; Dependence for feeding and/or oral hygiene Recommendations/Plan: Swallowing Evaluation Recommendations Swallowing Evaluation Recommendations Recommendations: PO diet PO Diet Recommendation: Dysphagia 1 (Pureed); Mildly thick liquids (Level 2, nectar thick) Liquid Administration via: Cup; Straw Medication Administration: Crushed with puree Supervision: Staff to assist with self-feeding; Full supervision/cueing for swallowing strategies Swallowing strategies  : Minimize environmental distractions; Slow rate; Small bites/sips; Follow solids with liquids Postural changes: Position pt fully upright for meals; Stay upright 30-60 min after meals Oral care recommendations: Oral care BID (2x/day) Treatment Plan Treatment Plan Treatment recommendations: Therapy as outlined in treatment plan below Follow-up recommendations: Skilled nursing-short term rehab (<3 hours/day) Functional status assessment: Patient has had a recent decline in their functional status and demonstrates the ability to make significant improvements in  function in a reasonable and predictable amount of time. Treatment frequency: Min 2x/week Treatment duration: 2 weeks Interventions: Aspiration precaution training; Patient/family education; Trials of upgraded texture/liquids; Diet toleration management by SLP Recommendations Recommendations for follow up therapy are one component of a multi-disciplinary discharge planning process, led by the attending physician.  Recommendations may be updated based on patient status, additional functional criteria and insurance authorization. Assessment: Orofacial Exam: Orofacial Exam Oral Cavity - Dentition: Adequate natural dentition Anatomy: No data recorded Boluses Administered: No data recorded Oral Impairment Domain: Oral Impairment Domain Lip Closure: Escape beyond mid-chin Tongue control during bolus hold: Posterior escape of greater than half of bolus (does not hold - aphasia) Bolus preparation/mastication: Disorganized chewing/mashing with solid pieces of bolus unchewed Bolus transport/lingual motion: Brisk tongue motion Oral residue: Trace residue lining oral structures Location of oral residue : Tongue Initiation of pharyngeal swallow : Pyriform sinuses  Pharyngeal Impairment Domain: Pharyngeal Impairment Domain Soft palate elevation: No bolus between soft palate (SP)/pharyngeal wall (  PW) (does have some escape into nasopharynx, but cleared by the time of maximum displacement) Laryngeal elevation: Complete superior movement of thyroid  cartilage with complete approximation of arytenoids to epiglottic petiole Anterior hyoid excursion: Partial anterior movement Epiglottic movement: Complete inversion Laryngeal vestibule closure: Complete, no air/contrast in laryngeal vestibule Pharyngeal stripping wave : Present - complete Pharyngeal contraction (A/P view only): N/A Pharyngoesophageal segment opening: Complete distension and complete duration, no obstruction of flow Tongue base retraction: No contrast between tongue base  and posterior pharyngeal wall (PPW) Pharyngeal residue: Complete pharyngeal clearance Location of pharyngeal residue: N/A  Esophageal Impairment Domain: Esophageal Impairment Domain Esophageal clearance upright position: -- (full esophageal sweep not done but did see some retention in proximal esophagus during trials) Pill: No data recorded Penetration/Aspiration Scale Score: Penetration/Aspiration Scale Score 1.  Material does not enter airway: Moderately thick liquids (Level 3, honey thick); Puree; Solid 3.  Material enters airway, remains ABOVE vocal cords and not ejected out: Mildly thick liquids (Level 2, nectar thick) 8.  Material enters airway, passes BELOW cords without attempt by patient to eject out (silent aspiration) : Thin liquids (Level 0) Compensatory Strategies: No data recorded  General Information: Caregiver present: No  Diet Prior to this Study: NPO   Temperature : Normal   Respiratory Status: Tachypneic   Supplemental O2: Nasal cannula   History of Recent Intubation: No  Behavior/Cognition: Alert; Cooperative; Doesn't follow directions Self-Feeding Abilities: Needs hand-over-hand assist for feeding Baseline vocal quality/speech: Not observed No data recorded Volitional Swallow: Unable to elicit Exam Limitations: Limited visibility Goal Planning: Prognosis for improved oropharyngeal function: Fair Barriers to Reach Goals: Language deficits No data recorded Patient/Family Stated Goal: difficulty stating Consulted and agree with results and recommendations: Pt unable/family or caregiver not available Pain: Pain Assessment Pain Assessment: Faces Faces Pain Scale: 0 Pain Intervention(s): Monitored during session End of Session: Start Time:SLP Start Time (ACUTE ONLY): 0901 Stop Time: SLP Stop Time (ACUTE ONLY): 9081 Time Calculation:SLP Time Calculation (min) (ACUTE ONLY): 17 min Charges: SLP Evaluations $ SLP Speech Visit: 1 Visit SLP Evaluations $BSS Swallow: 1 Procedure $MBS Swallow: 1 Procedure $  SLP EVAL LANGUAGE/SOUND PRODUCTION: 1 Procedure SLP visit diagnosis: SLP Visit Diagnosis: Dysphagia, oropharyngeal phase (R13.12) Past Medical History: Past Medical History: Diagnosis Date  Atypical chest pain   a. non obstructive by cath in 2008 and 2010;  b. 02/2012 Myoview : non-ischemic, EF 57%  Cellulitis   a. left foot-> s/p L BKA  Chronic kidney disease   Constipation   CVA (cerebral infarction)   a.  Small right parietal noted incidentally 04/2007;  b. right sided embolic CVA 05/2012;  c. TEE 2/14:  LVH, EF 55-60%, mild LAE, no LAA clot, no PFO, no R->L shunt by echo contrast, oscillating density on AV likely Lambl's Excressence   Diabetes mellitus, type II (HCC)   Diabetic retinopathy (HCC)   NPDR w/edema OU  Diaphragmatic hernia without mention of obstruction or gangrene   Dry skin   Esophageal reflux   HTN (hypertension)   Hx of BKA (HCC)   Bilateral  Hyperlipidemia   Irritable bowel syndrome   Morbid obesity (HCC)   Obesity, unspecified   Pain in shoulder 06/28/2015  Peripheral neuropathy   Stroke [facility])   2013-'no residual'  Syncope and collapse   a. near-syncopal episode in November 2008;  b. 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  Tobacco abuse   Vertigo  Past Surgical History: Past Surgical History: Procedure Laterality Date  AMPUTATION  12/30/2011  Procedure: AMPUTATION RAY;  Surgeon: Norleen Armor, MD;  [facility];  Service: Orthopedics;  Laterality: Left;  LEFT FIRST RAY AMPUTATION  AMPUTATION  01/13/2012  Procedure: AMPUTATION BELOW KNEE;  Surgeon: Norleen Armor, MD;  Location: [facility] OR;  Service: Orthopedics;  Laterality: Left;  AMPUTATION  03/04/2012  Procedure: AMPUTATION BELOW KNEE;  Surgeon: Norleen Armor, MD;  Location: [facility] OR;  Service: Orthopedics;  Laterality: Left;  Revision of Left Below Knee Amputation  AMPUTATION Right 11/02/2013  Procedure: AMPUTATION BELOW KNEE;  Surgeon: Jerona Harden GAILS, MD;  Location: [facility] OR;  Service: Orthopedics;  Laterality: Right;  Right Below Knee Amputation  BIOPSY   03/29/2019  Procedure: BIOPSY;  Surgeon: San Sandor GAILS, DO;  Location: [facility] ENDOSCOPY;  Service: Gastroenterology;;  EGD and Colon  CARDIAC CATHETERIZATION    LAD 30%, circumflex 50%, OM 75%, RI 60% with small branch 80%, dominant RCA 60%, EF 45-50%  CHOLECYSTECTOMY    COLONOSCOPY    COLONOSCOPY WITH PROPOFOL  N/A 03/29/2019  Procedure: COLONOSCOPY WITH PROPOFOL ;  Surgeon: San Sandor GAILS, DO;  Location: [facility] ENDOSCOPY;  Service: Gastroenterology;  Laterality: N/A;  ESOPHAGOGASTRODUODENOSCOPY (EGD) WITH PROPOFOL  N/A 03/29/2019  Procedure: ESOPHAGOGASTRODUODENOSCOPY (EGD) WITH PROPOFOL ;  Surgeon: San Sandor GAILS, DO;  Location: [facility] ENDOSCOPY;  Service: Gastroenterology;  Laterality: N/A;  EYE SURGERY Right   cataract  I & D EXTREMITY  12/23/2011  Procedure: IRRIGATION AND DEBRIDEMENT EXTREMITY;  Surgeon: Elspeth JONELLE Her, MD;  Location: [facility] OR;  Service: Orthopedics;  Laterality: Left;  Left Foot  I & D EXTREMITY  12/26/2011  Procedure: IRRIGATION AND DEBRIDEMENT EXTREMITY;  Surgeon: Norleen Armor, MD;  Location: [facility] OR;  Service: Orthopedics;  Laterality: Left;  LEFT FOOT I&D WITH POSSIBLE WOUND VAC APPLICATION, POSSIBLE LEFT FIRST RAY AMPUTATION  I & D EXTREMITY Right 10/29/2013  Procedure: IRRIGATION AND DEBRIDEMENT EXTREMITY;  Surgeon: Reyes JAYSON Billing, MD;  Location: [facility] OR;  Service: Orthopedics;  Laterality: Right;  Invasive Electrophysiologic Study  5/09  followed by insertion of an implantable loop recorder. S/p removal   IR FLUORO GUIDE CV LINE RIGHT  11/16/2023  IR US  GUIDE VASC ACCESS RIGHT  11/16/2023  Multiple Toe Surgeries    POLYPECTOMY  03/29/2019  Procedure: POLYPECTOMY;  Surgeon: San Sandor GAILS, DO;  Location: [facility] ENDOSCOPY;  Service: Gastroenterology;;  RIGHT/LEFT HEART CATH AND CORONARY ANGIOGRAPHY N/A 09/07/2023  Procedure: RIGHT/LEFT HEART CATH AND CORONARY ANGIOGRAPHY;  Surgeon: Anner Alm ORN, MD;  Location: [facility] INVASIVE CV LAB;  Service: Cardiovascular;  Laterality: N/A;  TEE WITHOUT CARDIOVERSION  N/A 07/07/2012  Procedure: TRANSESOPHAGEAL ECHOCARDIOGRAM (TEE);  Surgeon: Redell GORMAN Shallow, MD;  Location: [facility] ENDOSCOPY;  Service: Cardiovascular;  Laterality: N/A; Leita SAILOR., M.A. CCC-SLP Acute Rehabilitation Services Office: [phone] Secure chat preferred 11/19/2023, 9:57 AM  VAS US  CAROTID Result Date: 11/18/2023 Carotid Arterial Duplex Study Patient Name:  [name]  Date of Exam:   11/18/2023 Medical Rec #: [id number]         Accession #:    [id number] Date of Birth: January 06, 1956          Patient Gender: F Patient Age:   73 years Exam Location:  [facility] Procedure:      VAS US  CAROTID Referring Phys: ELLOUISE [facility] --------------------------------------------------------------------------------  Indications:       CVA and SOB. Risk Factors:      Hypertension, hyperlipidemia, Diabetes, PAD. Other Factors:     Bilateral BKA. Limitations        Today's exam was limited due to the body habitus of the  patient and temporary hemodialysis catheter placed right                    lower neck and coughing throughout the exam. Comparison Study:  No recent carotid studies. Performing Technologist: Ricka Sturdivant-Jones RDMS, RVT  Examination Guidelines: A complete evaluation includes B-mode imaging, spectral Doppler, color Doppler, and power Doppler as needed of all accessible portions of each vessel. Bilateral testing is considered an integral part of a complete examination. Limited examinations for reoccurring indications may be performed as noted.  Right Carotid Findings: +----------+--------+--------+--------+------------------+------------------+           PSV cm/sEDV cm/sStenosisPlaque DescriptionComments           +----------+--------+--------+--------+------------------+------------------+ CCA Prox  52      18                                                   +----------+--------+--------+--------+------------------+------------------+ CCA Distal54      14                                 intimal thickening +----------+--------+--------+--------+------------------+------------------+ ICA Prox  100     29      1-39%   calcific                             +----------+--------+--------+--------+------------------+------------------+ ICA Distal58      25                                                   +----------+--------+--------+--------+------------------+------------------+ ECA       107                                                          +----------+--------+--------+--------+------------------+------------------+ +----------+--------+-------+------------+-------------------+           PSV cm/sEDV cmsDescribe    Arm Pressure (mmHG) +----------+--------+-------+------------+-------------------+ Subclavian               Not assessed                    +----------+--------+-------+------------+-------------------+ +---------+--------+--------+------------+ VertebralPSV cm/sEDV cm/sNot assessed +---------+--------+--------+------------+  Left Carotid Findings: +----------+--------+--------+--------+------------------+------------------+           PSV cm/sEDV cm/sStenosisPlaque DescriptionComments           +----------+--------+--------+--------+------------------+------------------+ CCA Prox  55      10                                intimal thickening +----------+--------+--------+--------+------------------+------------------+ CCA Mid                                             intimal thickening +----------+--------+--------+--------+------------------+------------------+ CCA Distal50      17  intimal thickening +----------+--------+--------+--------+------------------+------------------+ ICA Prox  55      15      1-39%                     intimal thickening +----------+--------+--------+--------+------------------+------------------+ ICA Distal82      25                                                    +----------+--------+--------+--------+------------------+------------------+ ECA       59      12                                                   +----------+--------+--------+--------+------------------+------------------+ +----------+--------+--------+----------------+-------------------+           PSV cm/sEDV cm/sDescribe        Arm Pressure (mmHG) +----------+--------+--------+----------------+-------------------+ [id number]             Multiphasic, WNL                    +----------+--------+--------+----------------+-------------------+ +---------+--------+--+--------+--+---------+ VertebralPSV cm/s46EDV cm/s14Antegrade +---------+--------+--+--------+--+---------+   Summary: Right Carotid: Velocities in the right ICA are consistent with a 1-39% stenosis. Left Carotid: Velocities in the left ICA are consistent with a 1-39% stenosis.  *See table(s) above for measurements and observations.  Electronically signed by Gaile New MD on 11/18/2023 at 9:56:07 PM.    Final      Scheduled Meds:   stroke: early stages of recovery book   Does not apply Once   albuterol   2.5 mg Nebulization BID   atorvastatin   80 mg Oral Daily   Chlorhexidine  Gluconate Cloth  6 each Topical Q0600   clopidogrel   75 mg Oral Daily   heparin   5,000 Units Subcutaneous Q8H   insulin  aspart  0-15 Units Subcutaneous TID WC   insulin  aspart  0-5 Units Subcutaneous QHS   insulin  glargine-yfgn  9 Units Subcutaneous Daily   lactulose   20 g Oral BID   midodrine   2.5 mg Oral TID WC   omeprazole   40 mg Oral Daily   sodium chloride  flush  3 mL Intravenous Q12H   thiamine   100 mg Oral Daily   Continuous Infusions:     LOS: 9 days    Time spent:    Sigurd Pac, MD [facility]   11/20/2023, 10:56 AM

## 2023-11-20 NOTE — Progress Notes (Signed)
 SLP Cancellation Note  Patient Details Name: [name] MRN: [id number] DOB: 12/20/1955   Cancelled treatment:       Reason Eval/Treat Not Completed: Patient at procedure or test/unavailable (HD). Will f/u as able.     Leita SAILOR., M.A. CCC-SLP Acute Rehabilitation Services Office: [phone]  Secure chat preferred  11/20/2023, 1:38 PM

## 2023-11-21 DIAGNOSIS — Z515 Encounter for palliative care: Secondary | ICD-10-CM | POA: Diagnosis not present

## 2023-11-21 DIAGNOSIS — A419 Sepsis, unspecified organism: Secondary | ICD-10-CM | POA: Diagnosis not present

## 2023-11-21 DIAGNOSIS — Z7189 Other specified counseling: Secondary | ICD-10-CM | POA: Diagnosis not present

## 2023-11-21 DIAGNOSIS — R6520 Severe sepsis without septic shock: Secondary | ICD-10-CM | POA: Diagnosis not present

## 2023-11-21 LAB — GLUCOSE, CAPILLARY
Glucose-Capillary: 349 mg/dL — ABNORMAL HIGH (ref 70–99)
Glucose-Capillary: 267 mg/dL — ABNORMAL HIGH (ref 70–99)
Glucose-Capillary: 300 mg/dL — ABNORMAL HIGH (ref 70–99)
Glucose-Capillary: 310 mg/dL — ABNORMAL HIGH (ref 70–99)
Glucose-Capillary: 330 mg/dL — ABNORMAL HIGH (ref 70–99)

## 2023-11-21 MED ORDER — INSULIN GLARGINE-YFGN 100 UNIT/ML ~~LOC~~ SOLN
20.0000 [IU] | Freq: Every day | SUBCUTANEOUS | Status: DC
  Administered 2023-11-21: 20 [IU] via SUBCUTANEOUS
  Filled 2023-11-21 (×2): qty 0.2

## 2023-11-21 MED ORDER — INSULIN ASPART 100 UNIT/ML IJ SOLN
3.0000 [IU] | Freq: Three times a day (TID) | INTRAMUSCULAR | Status: DC
  Administered 2023-11-21 (×2): 3 [IU] via SUBCUTANEOUS

## 2023-11-21 NOTE — Progress Notes (Addendum)
 Daily Progress Note   Patient Name: [name]       Date: 11/21/2023 DOB: December 17, 1955  Age: 68 y.o. MRN#: [id number] Attending Physician: Fairy Frames, MD Primary Care Physician: Arnaez Zapata, Gerardo E, MD Admit Date: 11/11/2023  Reason for Consultation/Follow-up: Establishing goals of care  Length of Stay: 10  Current Medications: Scheduled Meds:    stroke: early stages of recovery book   Does not apply Once   albuterol   2.5 mg Nebulization BID   atorvastatin   80 mg Oral Daily   Chlorhexidine  Gluconate Cloth  6 each Topical Q0600   clopidogrel   75 mg Oral Daily   heparin   5,000 Units Subcutaneous Q8H   insulin  aspart  0-15 Units Subcutaneous TID WC   insulin  aspart  0-5 Units Subcutaneous QHS   insulin  aspart  3 Units Subcutaneous TID WC   insulin  glargine-yfgn  20 Units Subcutaneous Daily   lactulose   20 g Oral BID   midodrine   2.5 mg Oral TID WC   omeprazole   40 mg Oral Daily   sodium chloride  flush  3 mL Intravenous Q12H   thiamine   100 mg Oral Daily    Continuous Infusions:   PRN Meds: acetaminophen  **OR** acetaminophen , HYDROmorphone  (DILAUDID ) injection, ipratropium-albuterol , ondansetron  **OR** ondansetron  (ZOFRAN ) IV  Physical Exam Vitals reviewed.  Constitutional:      General: She is not in acute distress. HENT:     Head: Normocephalic and atraumatic.  Cardiovascular:     Rate and Rhythm: Normal rate.  Pulmonary:     Effort: Pulmonary effort is normal.  Musculoskeletal:     Comments: Bilateral AKA  Skin:    General: Skin is warm and dry.  Neurological:     Mental Status: She is alert and oriented to person, place, and time.  Psychiatric:        Mood and Affect: Mood normal.        Behavior: Behavior normal.             Vital Signs: BP (!)  109/56 (BP Location: Right Wrist)   Pulse 78   Temp 97.9 F (36.6 C) (Oral)   Resp (!) 25   Ht 5' 6 (1.676 m)   Wt 100.3 kg   SpO2 100%   BMI 35.69 kg/m  SpO2: SpO2: 100 % O2 Device: O2  Device: Nasal Cannula O2 Flow Rate: O2 Flow Rate (L/min): 4 L/min        Palliative Assessment/Data:30%      Patient Active Problem List   Diagnosis Date Noted   Lacunar infarct, acute (HCC) 11/17/2023   PAF (paroxysmal atrial fibrillation) (HCC) 11/12/2023   Severe sepsis due to community acquired pneumonia (HCC) 11/11/2023   Pressure injury of skin 09/14/2023   Ischemic cardiomyopathy 09/14/2023   Acute renal failure superimposed on stage 3a chronic kidney disease (HCC) 09/08/2023   Leukocytosis 09/07/2023   Chronic diastolic CHF (congestive heart failure) (HCC) 09/07/2023   Type 2 diabetes mellitus with hyperlipidemia (HCC) 09/07/2023   Chronic iron  deficiency anemia 09/07/2023   Acute on chronic combined systolic and diastolic CHF (congestive heart failure) (HCC) 09/07/2023   Demand ischemia due to type 2 NSTEMI (HCC) 09/06/2023   Heme positive stool    Rectal polyp    Adenomatous polyp of transverse colon    Gastritis and gastroduodenitis    Phantom limb pain (HCC) 11/09/2017   Other abnormalities of gait and mobility 11/09/2017   S/P BKA (below knee amputation) bilateral (HCC) 08/20/2015   Dental caries 08/20/2015   Constipation 08/15/2015   Depression 08/15/2015   Pain in shoulder 06/28/2015   Hemiparesis (HCC) 01/10/2013   Tobacco abuse    Paresthesia of right arm and leg 12/17/2011   Facial droop 12/15/2011   TIA (transient ischemic attack) 12/15/2011   Syncope 07/02/2011   Lower urinary tract infectious disease 07/02/2011   Normocytic anemia 07/02/2011   Hereditary and idiopathic peripheral neuropathy 04/02/2009   Essential hypertension 04/02/2009   Coronary artery disease involving native coronary artery of native heart without angina pectoris 04/02/2009   HIATAL  HERNIA 04/02/2009   Irritable bowel syndrome 04/02/2009   HLD (hyperlipidemia) 05/30/2008   Class 2 obesity 05/30/2008   Cerebrovascular disease 05/30/2008   CVA (cerebral infarction) 05/30/2008   GERD 05/30/2008    Palliative Care Assessment & Plan   Patient Profile: 68 y.o. F with obesity, sCHF EF 20-25%, recent NSTEMI treated medically, DM and hx bilat BKA, HTN, CKD IIIa baseline 1.2 and hx stroke without residual who presented with SOB. The Palliative care team has been asked to support additional goals of care conversations.   Today's Discussion: Patient sitting up in bed in NAD. She is able to speak today. Several family members are at bedside for goals of care discussion including niece/HCPOA [name]. I previously met with this patient and family during her last hospitalization so they are familiar with PMT. We discussed the patient's chronic conditions including heart failure, recent NSTEMI, and diabetes. [name] shared her understanding of the patient's current hospitalization including her sepsis, pneumonia, CVA, and worsening renal failure and start of dialysis. I reviewed my understanding of her health, including limitations of ongoing interventions and high risk for further decline despite aggressive treatment efforts. We discussed the concern from the medical team that due to her comorbidities the patient may not tolerate dialysis well and it may not improve her quality of life.   The patient shared that she understands this but would like to attempt dialysis. She wants to see if she can tolerate it. She hopes dialysis will extend her life. This is consistent with our previous discussion where she said she would want aggressive therapies to extend the length of her life. She did allow me to discuss the natural disease trajectory of end stage renal disease and expectations at EOL. I encouraged her to continue open  discussion with her family and let them know if her thoughts on  quality of life, overall suffering, what would be acceptable to her in the future changes. Patient and family agreeable to outpatient palliative follow up.  Encouraged both patient and family to call PMT with questions or concerns.   Recommendations/Plan: Full code Full scope Continue present care Outpatient palliative at discharge- TOC order placed Continued PMT support   Code Status:    Code Status Orders  (From admission, onward)           Start     Ordered   11/11/23 1841  Full code  Continuous       Question:  By:  Answer:  Other   11/11/23 1843         Extensive chart review has been completed prior to seeing the patient including labs, vital signs, imaging, progress/consult notes, orders, medications, and available advance directive documents.  Care plan was discussed with Dr. Fairy  Time spent: 65 minutes  Thank you for allowing the Palliative Medicine Team to assist in the care of this patient.    Stephane CHRISTELLA Palin, NP  Please contact Palliative Medicine Team phone at [phone] for questions and concerns.

## 2023-11-21 NOTE — Progress Notes (Addendum)
 PROGRESS NOTE    [name]  FMW:[id number] DOB: 03-27-1956 DOA: 11/11/2023 PCP: Ellaree Hansel [name], MD   68 y.o. F with obesity, sCHF EF 20-25%, recent NSTEMI treated medically, DM and hx bilat BKA, HTN, CKD IIIa baseline 1.2 and hx stroke without residual who presented with SOB.  -Recently admitted for NSTEMI/CHF, underwent LHC that showed severe calcific disease, 80% mid-LAD, 90% prox RCA and 90% ramus intermedius, no PCI due to technically challenging targets, not CABG candidate.  EF 20-25%. -Recovered afterwards and was feeling well, until a day or two before admission, became SOB, sent from facility to hospital.    -In the ER, CXR showed bilateral opacities. Lactate >5.  New atrial fibrillation.  Suspected CHF, pneumonia sepsis, started on antibiotics, diuretics, Cardiology consulted.  -Subsequent hospital course complicated by renal failure.  CVC placed 6/30, initiated dialysis 7/1. -6/30 after temporary HD catheter was placed patient was noted to be nonverbal, MRI obtained noted 5 mm stroke in right frontal lobe, EEG negative - Palliative care following -7/2: Abx stopped, 7days completed -7/3, respiratory distress, stabilized after pulmonary toilet    Subjective: - Speaking this morning, clear words and sentences  Assessment and Plan:  Severe sepsis due to community acquired pneumonia (HCC) Acute respiratory failure with hypoxia Presented with tachycardia, tachypnea, leukocytosis, and respiratory failure with lactic acid greater than 5.  Chest x-ray shows bilateral infiltrates.  Procalcitonin 0.6, trended up to >2. -  completed 7 days of Abx 7/2 - Bronchodilators, pulmonary toilet - Continue midodrine   Acute renal failure superimposed on stage 3a chronic kidney disease (HCC) Baseline creatinine appears to be around 1.2 until her recent admission for CHF and NSTEMI, -now developed oliguric renal failure, likely ischemic ATN in the setting of IV contrast, sepsis,  vancomycin , hypotension on 6/27 and diuresis for CHF. -Renal US  unremarkable.   -Vanc stopped, creatinine continued trending up. Nephrology consulted, HD catheter placed started hemodialysis 7/1 -Nephrology and palliative care following -poor candidate for longterm HD, called and updated niece, plan for palliative care meeting today  Acute on chronic combined systolic and diastolic CHF (congestive heart failure) [facility]) -  Cardiology consulted have since signed off.  EF 20-25% with advanced CAD not amenable to intervention. - Now complicated by AKI started HD, GDMT limited by hypotension -Dr.Danford d/w advanced heart failure team again on 7/1, given multiorgan failure, they do not recommend any additional interventions at this time  Acute CVA, lacunar infarct Acute metabolic encephalopathy At baseline, patient has no known cognitive impairment.   - On 6/30 noted to be aphasic, less responsive  -MRI brain was obtained that showed small lacunar infarct.  In right frontal lobe  -Consensus from neurology is that infarct likely incidental. -EEG negative for seizures, noted moderate diffuse encephalopathy  -Neurology recommended aspirin  and Plavix  for 3 weeks followed by Plavix  alone - Follow-up carotid duplex, LDL is 61, A1c 7 - PT OT following - Aphasia has improved today  Chronic iron  deficiency anemia Hemoglobin trending down in the setting of renal failure and phlebotomy. No clinical bleeding observed or reported. - Follow Hgb   Type 2 diabetes mellitus with hyperlipidemia (HCC) Remains somewhat hyperglycemic - Hold metformin , GLP-1 - Increase glargine, SSI  Essential hypertension Cerebrovascular disease Hypotensive earlier in hospital stay, currently stable on midodrine  - Continue midodrine  - Holding Lasix , Isordil , hydralazine , metoprolol    HLD (hyperlipidemia) - Continue Lipitor   S/P BKA (below knee amputation) bilateral (HCC)   PAF (paroxysmal atrial fibrillation)  (HCC) Previous ILR ~10  yrs ago showed no Afib.  On admission this time, transient Afib, now resolved.  In setting of very brief transient episode during sepsis, will defer [facility] for now. - Monitor on tele  Demand ischemia due to type 2 NSTEMI [facility]) Severe calcific coronary artery disease with chronic angina Evaluated by Cardiology.  They suspected type 2 NSTEMI, not ACS.   - Continue Lipitor , Plavix   Class 2 obesity BMI 38, morbid obesity in the setting of comorbid diabetes, hypertension  DVT prophylaxis: hep SQ Code Status: Full Code Family Communication: none present, called and updated neice yesterday Disposition Plan: TBD  Consultants:    Procedures:   Antimicrobials:    Objective: Vitals:   11/20/23 1958 11/21/23 0021 11/21/23 0427 11/21/23 0733  BP: (!) 89/47 (!) 90/44 (!) 112/53 (!) 110/56  Pulse: 90 82 84 80  Resp: (!) 31  (!) 25 (!) 22  Temp: 99 F (37.2 C) 98.7 F (37.1 C) 98.5 F (36.9 C) 97.8 F (36.6 C)  TempSrc: Oral Axillary Oral Oral  SpO2: 100% 100% 100%   Weight:   100.3 kg   Height:        Intake/Output Summary (Last 24 hours) at 11/21/2023 1112 Last data filed at 11/21/2023 0430 Gross per 24 hour  Intake 1080 ml  Output 1550 ml  Net -470 ml   Filed Weights   11/20/23 1248 11/20/23 1658 11/21/23 0427  Weight: 99.8 kg 98.3 kg 100.3 kg    Examination:  General exam: Obese, chronically ill laying in bed, awake alert, oriented to self and place, talking today HEENT: Neck obese unable to assess JVD, right IJ central line, HD cath CVS: S1-S2, regular rhythm Lungs: Few scattered rhonchi Abdomen: Soft, nontender, bowel sounds present Extremities: Bilateral BKA Neuro: Awake and alert, oriented, moves all extremities no other localizing signs Skin: No rashes Psychiatry: Flat affect    Data Reviewed:   CBC: Recent Labs  Lab 11/16/23 0307 11/17/23 0224 11/18/23 0226 11/19/23 0224 11/20/23 0232  WBC 16.4* 14.4* 16.4* 16.5* 19.8*  HGB 9.7*  8.6* 8.5* 9.0* 9.1*  HCT 32.9* 28.9* 26.8* 29.3* 29.2*  MCV 86.6 88.9 83.2 86.9 85.1  PLT 220 201 229 230 217   Basic Metabolic Panel: Recent Labs  Lab 11/16/23 0307 11/17/23 0224 11/18/23 0226 11/19/23 0224 11/20/23 0232  NA 136 138 137 138 139  K 4.5 4.5 4.2 4.3 4.4  CL 105 104 100 102 105  CO2 13* 16* 20* 20* 21*  GLUCOSE 205* 240* 106* 133* 210*  BUN 70* 80* 52* 31* 49*  CREATININE 4.94* 5.94* 4.53* 3.58* 4.78*  CALCIUM  7.6* 7.3* 7.3* 7.8* 7.4*  PHOS 5.8* 6.4* 4.9* 4.7* 5.0*   GFR: Estimated Creatinine Clearance: 13.3 mL/min (A) (by C-G formula based on SCr of 4.78 mg/dL (H)). Liver Function Tests: Recent Labs  Lab 11/16/23 0307 11/17/23 0224 11/18/23 0226 11/19/23 0224 11/20/23 0232  ALBUMIN  2.5* 2.3* 2.4* 2.4* 2.5*   No results for input(s): LIPASE, AMYLASE in the last 168 hours. No results for input(s): AMMONIA in the last 168 hours. Coagulation Profile: No results for input(s): INR, PROTIME in the last 168 hours. Cardiac Enzymes: No results for input(s): CKTOTAL, CKMB, CKMBINDEX, TROPONINI in the last 168 hours. BNP (last 3 results) No results for input(s): PROBNP in the last 8760 hours. HbA1C: No results for input(s): HGBA1C in the last 72 hours.  CBG: Recent Labs  Lab 11/20/23 0608 11/20/23 1143 11/20/23 1749 11/20/23 2059 11/21/23 0629  GLUCAP 201* 370* 140* 364* 349*  Lipid Profile: No results for input(s): CHOL, HDL, LDLCALC, TRIG, CHOLHDL, LDLDIRECT in the last 72 hours.  Thyroid  Function Tests: No results for input(s): TSH, T4TOTAL, FREET4, T3FREE, THYROIDAB in the last 72 hours. Anemia Panel: No results for input(s): VITAMINB12, FOLATE, FERRITIN, TIBC, IRON , RETICCTPCT in the last 72 hours. Urine analysis:    Component Value Date/Time   COLORURINE YELLOW 11/14/2023 1641   APPEARANCEUR CLOUDY (A) 11/14/2023 1641   LABSPEC >1.030 (H) 11/14/2023 1641   PHURINE 5.0 11/14/2023 1641    GLUCOSEU 100 (A) 11/14/2023 1641   HGBUR SMALL (A) 11/14/2023 1641   BILIRUBINUR NEGATIVE 11/14/2023 1641   KETONESUR 15 (A) 11/14/2023 1641   PROTEINUR >=300 11/14/2023 1641   UROBILINOGEN 0.2 12/31/2014 1928   NITRITE NEGATIVE 11/14/2023 1641   LEUKOCYTESUR SMALL (A) 11/14/2023 1641   Sepsis Labs: @LABRCNTIP (procalcitonin:4,lacticidven:4)  ) Recent Results (from the past 240 hours)  Blood culture (routine x 2)     Status: None   Collection Time: 11/11/23  1:08 PM   Specimen: BLOOD RIGHT FOREARM  Result Value Ref Range Status   Specimen Description BLOOD RIGHT FOREARM  Final   Special Requests   Final    BOTTLES DRAWN AEROBIC ONLY Blood Culture adequate volume   Culture   Final    NO GROWTH 5 DAYS Performed at [facility], [address] [place], [place] [address]    Report Status 11/16/2023 FINAL  Final  Blood culture (routine x 2)     Status: None   Collection Time: 11/11/23  3:05 PM   Specimen: BLOOD LEFT HAND  Result Value Ref Range Status   Specimen Description BLOOD LEFT HAND  Final   Special Requests   Final    BOTTLES DRAWN AEROBIC AND ANAEROBIC Blood Culture adequate volume   Culture   Final    NO GROWTH 5 DAYS Performed at [facility], [address], [place], [place] [address]    Report Status 11/16/2023 FINAL  Final  MRSA Next Gen by PCR, Nasal     Status: None   Collection Time: 11/13/23  6:51 AM   Specimen: Nasal Mucosa; Nasal Swab  Result Value Ref Range Status   MRSA by PCR Next Gen NOT DETECTED NOT DETECTED Final    Comment: (NOTE) The GeneXpert MRSA Assay (FDA approved for NASAL specimens only), is one component of a comprehensive MRSA colonization surveillance program. It is not intended to diagnose MRSA infection nor to guide or monitor treatment for MRSA infections. Test performance is not FDA approved in patients less than 33 years old. Performed at [facility], [address], [place], [place] [address]       Radiology Studies: DG CHEST PORT 1 VIEW Result Date: 11/19/2023 CLINICAL DATA:  [id number] Dyspnea [id number] EXAM: PORTABLE CHEST 1 VIEW COMPARISON:  Chest x-ray 11/16/2023 FINDINGS: Right internal jugular central venous catheter with tip overlying the right atrium. The heart and mediastinal contours are unchanged. Atherosclerotic plaque. Low lung volumes. Limited evaluation of the right apex due to overlying mandible. No focal consolidation. No pulmonary edema. Likely bilateral trace pleural effusions. No pneumothorax. No acute osseous abnormality. IMPRESSION: 1. Likely bilateral trace pleural effusions. Consider repeat PA and lateral view of the chest with improved inspiratory effort. 2. Persistent cardiomegaly. 3.  Aortic Atherosclerosis (ICD10-I70.0). Electronically Signed   By: Morgane  Naveau M.D.   On: 11/19/2023 17:14     Scheduled Meds:   stroke: early stages of recovery book   Does not  apply Once   albuterol   2.5 mg Nebulization BID   atorvastatin   80 mg Oral Daily   Chlorhexidine  Gluconate Cloth  6 each Topical Q0600   clopidogrel   75 mg Oral Daily   heparin   5,000 Units Subcutaneous Q8H   insulin  aspart  0-15 Units Subcutaneous TID WC   insulin  aspart  0-5 Units Subcutaneous QHS   insulin  aspart  3 Units Subcutaneous TID WC   insulin  glargine-yfgn  20 Units Subcutaneous Daily   lactulose   20 g Oral BID   midodrine   2.5 mg Oral TID WC   omeprazole   40 mg Oral Daily   sodium chloride  flush  3 mL Intravenous Q12H   thiamine   100 mg Oral Daily   Continuous Infusions:     LOS: 10 days    Time spent:    Sigurd Pac, MD [facility]   11/21/2023, 11:12 AM

## 2023-11-21 NOTE — Progress Notes (Signed)
  [facility] PROGRESS [facility]  Assessment/ Plan: Pt is a 68 y.o. yo female  w/ DM with retinopathy and peripheral neuropathy, HTN, PAD with B/L AKA, HLD, CVA, CKD 3A as below who presented to ED sent from SNF due to SOB 11/11/2023.   # AKI on CKD 3b: b/l creat is 1.2- 1.7 from April - June 2025, eGFR 32- 48 ml/min.  She had IV contrast on 6/25 and then had multiple episodes of hypotension.  AKI likely due to ischemic ATN/contrast and hypotension and advanced CHF. -HD started on 7/1 after placement of right IJ temporary HD catheter on 6/30. -Last HD on 7/4 with 1.5 L UF, tolerated well.  No plan for HD today. - Given advanced CHF not amenable for intervention per cardiology, the patient may not tolerate outpatient dialysis well and the quality of life may not improve.  The palliative care team is meeting with the family today.    # BP/volume: UF with HD, on midodrine  for hypotension.  # Anemia: Ferritin level high, hemoglobin stable.  Monitor.  # Sepsis due to pneumonia, acute respiratory failure with hypoxia: Completed 7 days of cefepime .  Subjective: Seen and examined.  Talking this morning.  Tolerated dialysis well yesterday.  Denies nausea, vomiting, chest pain or shortness of breath.  Discussed with the primary team as well.  Objective Vital signs in last 24 hours: Vitals:   11/20/23 1958 11/21/23 0021 11/21/23 0427 11/21/23 0733  BP: (!) 89/47 (!) 90/44 (!) 112/53 (!) 110/56  Pulse: 90 82 84 80  Resp: (!) 31  (!) 25 (!) 22  Temp: 99 F (37.2 C) 98.7 F (37.1 C) 98.5 F (36.9 C) 97.8 F (36.6 C)  TempSrc: Oral Axillary Oral Oral  SpO2: 100% 100% 100%   Weight:   100.3 kg   Height:       Weight change: 0.9 kg  Intake/Output Summary (Last 24 hours) at 11/21/2023 1046 Last data filed at 11/21/2023 0430 Gross per 24 hour  Intake 1080 ml  Output 1550 ml  Net -470 ml       Labs: RENAL PANEL Recent Labs  Lab 11/16/23 0307 11/17/23 0224  11/18/23 0226 11/19/23 0224 11/20/23 0232  NA 136 138 137 138 139  K 4.5 4.5 4.2 4.3 4.4  CL 105 104 100 102 105  CO2 13* 16* 20* 20* 21*  GLUCOSE 205* 240* 106* 133* 210*  BUN 70* 80* 52* 31* 49*  CREATININE 4.94* 5.94* 4.53* 3.58* 4.78*  CALCIUM  7.6* 7.3* 7.3* 7.8* 7.4*  PHOS 5.8* 6.4* 4.9* 4.7* 5.0*  ALBUMIN  2.5* 2.3* 2.4* 2.4* 2.5*    Liver Function Tests: Recent Labs  Lab 11/18/23 0226 11/19/23 0224 11/20/23 0232  ALBUMIN  2.4* 2.4* 2.5*   No results for input(s): LIPASE, AMYLASE in the last 168 hours. No results for input(s): AMMONIA in the last 168 hours. CBC: Recent Labs    09/09/23 1101 09/10/23 0415 11/15/23 0432 11/15/23 1527 11/16/23 0307 11/17/23 0224 11/18/23 0226 11/19/23 0224 11/20/23 0232  HGB  --    < >  --    < > 9.7* 8.6* 8.5* 9.0* 9.1*  MCV  --    < >  --    < > 86.6 88.9 83.2 86.9 85.1  VITAMINB12 578  --   --   --   --   --   --   --   --   FOLATE 12.8  --   --   --   --   --   --   --   --  FERRITIN 275  --  1,093*  --   --   --   --   --   --   TIBC 209*  --  192*  --   --   --   --   --   --   IRON  24*  --  54  --   --   --   --   --   --   RETICCTPCT 1.3  --   --   --   --   --   --   --   --    < > = values in this interval not displayed.    Cardiac Enzymes: No results for input(s): CKTOTAL, CKMB, CKMBINDEX, TROPONINI in the last 168 hours. CBG: Recent Labs  Lab 11/20/23 0608 11/20/23 1143 11/20/23 1749 11/20/23 2059 11/21/23 0629  GLUCAP 201* 370* 140* 364* 349*    Iron  Studies: No results for input(s): IRON , TIBC, TRANSFERRIN, FERRITIN in the last 72 hours. Studies/Results: DG CHEST PORT 1 VIEW Result Date: 11/19/2023 CLINICAL DATA:  [id number] Dyspnea [id number] EXAM: PORTABLE CHEST 1 VIEW COMPARISON:  Chest x-ray 11/16/2023 FINDINGS: Right internal jugular central venous catheter with tip overlying the right atrium. The heart and mediastinal contours are unchanged. Atherosclerotic plaque. Low lung volumes.  Limited evaluation of the right apex due to overlying mandible. No focal consolidation. No pulmonary edema. Likely bilateral trace pleural effusions. No pneumothorax. No acute osseous abnormality. IMPRESSION: 1. Likely bilateral trace pleural effusions. Consider repeat PA and lateral view of the chest with improved inspiratory effort. 2. Persistent cardiomegaly. 3.  Aortic Atherosclerosis (ICD10-I70.0). Electronically Signed   By: Morgane  Naveau M.D.   On: 11/19/2023 17:14    Medications: Infusions:   Scheduled Medications:   stroke: early stages of recovery book   Does not apply Once   albuterol   2.5 mg Nebulization BID   atorvastatin   80 mg Oral Daily   Chlorhexidine  Gluconate Cloth  6 each Topical Q0600   clopidogrel   75 mg Oral Daily   heparin   5,000 Units Subcutaneous Q8H   insulin  aspart  0-15 Units Subcutaneous TID WC   insulin  aspart  0-5 Units Subcutaneous QHS   insulin  aspart  3 Units Subcutaneous TID WC   insulin  glargine-yfgn  20 Units Subcutaneous Daily   lactulose   20 g Oral BID   midodrine   2.5 mg Oral TID WC   omeprazole   40 mg Oral Daily   sodium chloride  flush  3 mL Intravenous Q12H   thiamine   100 mg Oral Daily    have reviewed scheduled and prn medications.  Physical Exam: General:NAD, comfortable Heart:RRR, s1s2 nl Lungs:clear b/l, no crackle Abdomen:soft, Non-tender, non-distended Extremities: Trace peripheral edema present Dialysis Access: Right IJ temporary HD catheter  [name] [name] 11/21/2023,10:46 AM  LOS: 10 days

## 2023-11-22 ENCOUNTER — Inpatient Hospital Stay (HOSPITAL_COMMUNITY)

## 2023-11-22 DIAGNOSIS — R6520 Severe sepsis without septic shock: Secondary | ICD-10-CM | POA: Diagnosis not present

## 2023-11-22 DIAGNOSIS — A419 Sepsis, unspecified organism: Secondary | ICD-10-CM | POA: Diagnosis not present

## 2023-11-22 DIAGNOSIS — Z515 Encounter for palliative care: Secondary | ICD-10-CM | POA: Diagnosis not present

## 2023-11-22 DIAGNOSIS — Z7189 Other specified counseling: Secondary | ICD-10-CM | POA: Diagnosis not present

## 2023-11-22 LAB — BASIC METABOLIC PANEL WITH GFR
Anion gap: 18 — ABNORMAL HIGH (ref 5–15)
BUN: 47 mg/dL — ABNORMAL HIGH (ref 8–23)
CO2: 21 mmol/L — ABNORMAL LOW (ref 22–32)
Calcium: 7.3 mg/dL — ABNORMAL LOW (ref 8.9–10.3)
Chloride: 96 mmol/L — ABNORMAL LOW (ref 98–111)
Creatinine, Ser: 4.81 mg/dL — ABNORMAL HIGH (ref 0.44–1.00)
GFR, Estimated: 9 mL/min — ABNORMAL LOW (ref >60)
Glucose, Bld: 333 mg/dL — ABNORMAL HIGH (ref 70–99)
Potassium: 3.8 mmol/L (ref 3.5–5.1)
Sodium: 135 mmol/L (ref 135–145)

## 2023-11-22 LAB — CBC
HCT: 31.4 % — ABNORMAL LOW (ref 36.0–46.0)
Hemoglobin: 9.6 g/dL — ABNORMAL LOW (ref 12.0–15.0)
MCH: 26.4 pg (ref 26.0–34.0)
MCHC: 30.6 g/dL (ref 30.0–36.0)
MCV: 86.5 fL (ref 80.0–100.0)
Platelets: 191 K/uL (ref 150–400)
RBC: 3.63 MIL/uL — ABNORMAL LOW (ref 3.87–5.11)
RDW: 21.2 % — ABNORMAL HIGH (ref 11.5–15.5)
WBC: 22.5 K/uL — ABNORMAL HIGH (ref 4.0–10.5)
nRBC: 2.1 % — ABNORMAL HIGH (ref 0.0–0.2)

## 2023-11-22 LAB — GLUCOSE, CAPILLARY
Glucose-Capillary: 324 mg/dL — ABNORMAL HIGH (ref 70–99)
Glucose-Capillary: 247 mg/dL — ABNORMAL HIGH (ref 70–99)
Glucose-Capillary: 274 mg/dL — ABNORMAL HIGH (ref 70–99)
Glucose-Capillary: 281 mg/dL — ABNORMAL HIGH (ref 70–99)

## 2023-11-22 MED ORDER — SODIUM CHLORIDE 0.9 % IV SOLN
1.0000 g | INTRAVENOUS | Status: DC
  Administered 2023-11-22 – 2023-11-23 (×2): 1 g via INTRAVENOUS
  Filled 2023-11-22 (×3): qty 10

## 2023-11-22 MED ORDER — INSULIN GLARGINE-YFGN 100 UNIT/ML ~~LOC~~ SOLN
30.0000 [IU] | Freq: Every day | SUBCUTANEOUS | Status: DC
  Administered 2023-11-22 – 2023-11-23 (×2): 30 [IU] via SUBCUTANEOUS
  Filled 2023-11-22 (×3): qty 0.3

## 2023-11-22 MED ORDER — CHLORHEXIDINE GLUCONATE CLOTH 2 % EX PADS: 6.0000 | MEDICATED_PAD | Freq: Every day | CUTANEOUS | Status: DC

## 2023-11-22 MED ORDER — INSULIN ASPART 100 UNIT/ML IJ SOLN
5.0000 [IU] | Freq: Three times a day (TID) | INTRAMUSCULAR | Status: DC
  Administered 2023-11-22 – 2023-11-23 (×5): 5 [IU] via SUBCUTANEOUS

## 2023-11-22 MED ORDER — MIDODRINE HCL 5 MG PO TABS
10.0000 mg | ORAL_TABLET | Freq: Three times a day (TID) | ORAL | Status: DC
  Administered 2023-11-22 – 2023-11-24 (×7): 10 mg via ORAL
  Filled 2023-11-22 (×6): qty 2

## 2023-11-22 NOTE — Progress Notes (Addendum)
 Speech Language Pathology Treatment: Dysphagia  Patient Details Name: [name] MRN: [id number] DOB: Jul 01, 1955 Today's Date: 11/22/2023 Time: 8669-8654 SLP Time Calculation (min) (ACUTE ONLY): 15 min  Assessment / Plan / Recommendation Clinical Impression  SLP called to reassess pt due to worsening in congestion. Pt observed to have very congested breathing with coughing before any PO given. Pt given sips of nectar thick liquids, consistent with current diet. She was attentive, took swift straw sips. No subjective signs of struggle or delay. After sips pt had the same coughing as seen prior to sips. MBS also notes frequent coughing though at worst there was only a slight coating of barium on glottis, no significant aspiration. Nectar thick liquids chosen as a conservative option just in case function fluctuates.    Pt of course has the potential to aspirate, but there is no observable change in function subjectively in comparison with instrumental test from 2 days ago. Reported inconclusive findings to MD. If MD wants to fully protect against aspiration the only next step from thickened liquids and puree, which are already a conservative and restrictive diet texture, would be to make pt NPO for a period of time. SLP will follow up tomorrow to check in again. If staff observes pt overtly struggling with PO intake please message [facility] SLP group.    HPI HPI: [name] is a 68 yo female presenting to ED 6/25 with SOB after recent admission for NSTEMI (not a candidate for CABG or PCI). Admitted with sepsis and CAP. CT Chest shows extensive patchy and ground-glass opacity in both lungs with progression, compatible with worsening pulmonary edema and possible PNA. During HD cath placement 6/30, there was a sudden change to nonverbal and subsequent MRI showed R frontal CVA. Failed swallow screen 7/1 but unable to coordinate evaluation due to intermittent BiPAP use and HD. Previously seen by SLP in 2013  and 2014 with a functional appearing swallow and memory deficits. PMH includes SOB, NSTEMI, prior CVA, CKD 3A, bil BKA, HTN, DM, IBS, anemia, and diabetic retinopathy      SLP Plan  Continue with current plan of care          Recommendations  Diet recommendations: Nectar-thick liquid;Dysphagia 1 (puree) Liquids provided via: Straw;Cup Medication Administration: Crushed with puree Supervision: Full supervision/cueing for compensatory strategies Compensations: Slow rate;Small sips/bites Postural Changes and/or Swallow Maneuvers: Seated upright 90 degrees                      Frequent or constant Supervision/Assistance Dysphagia, oropharyngeal phase (R13.12)     Continue with current plan of care     Shuna Tabor, Consuelo Fitch  11/22/2023, 1:56 PM

## 2023-11-22 NOTE — Progress Notes (Addendum)
 [facility] PROGRESS NOTE  Assessment/ Plan: Pt is a 68 y.o. yo female  w/ DM with retinopathy and peripheral neuropathy, HTN, PAD with B/L AKA, HLD, CVA, CKD 3A as below who presented to ED sent from SNF due to SOB 11/11/2023.   # AKI on CKD 3b: b/l creat is 1.2- 1.7 from April - June 2025, eGFR 32- 48 ml/min.  She had IV contrast on 6/25 and then had multiple episodes of hypotension.  AKI likely due to ischemic ATN/contrast and hypotension and advanced CHF. -HD started on 7/1 after placement of right IJ temporary HD catheter on 6/30.  Need to convert to [facility] if no renal recovery. -Last HD on 7/4 with 1.5 L UF, tolerated well.  Plan for next dialysis tomorrow. - Given advanced CHF not amenable for intervention per cardiology, the patient may not tolerate outpatient dialysis well and the quality of life may not improve.  The palliative care team met with family and patient decided to continue full scope of treatment including dialysis.    # BP/volume: UF with HD, on midodrine  for hypotension.  # Anemia: Ferritin level high, hemoglobin stable.  Monitor.  # Sepsis due to pneumonia, acute respiratory failure with hypoxia: Completed 7 days of cefepime .  Now resuming cefepime  because of possible aspiration pneumonia.  Subjective: Seen and examined.  No significant urine output.  Some coughing spells.  Denies nausea, vomiting.  No other new event. Objective Vital signs in last 24 hours: Vitals:   11/21/23 1708 11/21/23 2059 11/22/23 0011 11/22/23 0736  BP: (!) 103/49   (!) 95/55  Pulse:  94  87  Resp: (!) 22 (!) 29  (!) 21  Temp: 98.8 F (37.1 C)  97.8 F (36.6 C) 98.3 F (36.8 C)  TempSrc: Oral  Oral Oral  SpO2:    100%  Weight:      Height:       Weight change:   Intake/Output Summary (Last 24 hours) at 11/22/2023 1132 Last data filed at 11/22/2023 1049 Gross per 24 hour  Intake 243 ml  Output --  Net 243 ml       Labs: RENAL PANEL Recent Labs  Lab  11/16/23 0307 11/17/23 0224 11/18/23 0226 11/19/23 0224 11/20/23 0232 11/22/23 0347  NA 136 138 137 138 139 135  K 4.5 4.5 4.2 4.3 4.4 3.8  CL 105 104 100 102 105 96*  CO2 13* 16* 20* 20* 21* 21*  GLUCOSE 205* 240* 106* 133* 210* 333*  BUN 70* 80* 52* 31* 49* 47*  CREATININE 4.94* 5.94* 4.53* 3.58* 4.78* 4.81*  CALCIUM  7.6* 7.3* 7.3* 7.8* 7.4* 7.3*  PHOS 5.8* 6.4* 4.9* 4.7* 5.0*  --   ALBUMIN  2.5* 2.3* 2.4* 2.4* 2.5*  --     Liver Function Tests: Recent Labs  Lab 11/18/23 0226 11/19/23 0224 11/20/23 0232  ALBUMIN  2.4* 2.4* 2.5*   No results for input(s): LIPASE, AMYLASE in the last 168 hours. No results for input(s): AMMONIA in the last 168 hours. CBC: Recent Labs    09/09/23 1101 09/10/23 0415 11/15/23 0432 11/15/23 1527 11/17/23 0224 11/18/23 0226 11/19/23 0224 11/20/23 0232 11/22/23 0743  HGB  --    < >  --    < > 8.6* 8.5* 9.0* 9.1* 9.6*  MCV  --    < >  --    < > 88.9 83.2 86.9 85.1 86.5  VITAMINB12 578  --   --   --   --   --   --   --   --  FOLATE 12.8  --   --   --   --   --   --   --   --   FERRITIN 275  --  1,093*  --   --   --   --   --   --   TIBC 209*  --  192*  --   --   --   --   --   --   IRON  24*  --  54  --   --   --   --   --   --   RETICCTPCT 1.3  --   --   --   --   --   --   --   --    < > = values in this interval not displayed.    Cardiac Enzymes: No results for input(s): CKTOTAL, CKMB, CKMBINDEX, TROPONINI in the last 168 hours. CBG: Recent Labs  Lab 11/21/23 1133 11/21/23 1408 11/21/23 1653 11/21/23 2141 11/22/23 0649  GLUCAP 267* 300* 310* 330* 324*    Iron  Studies: No results for input(s): IRON , TIBC, TRANSFERRIN, FERRITIN in the last 72 hours. Studies/Results: No results found.   Medications: Infusions:  ceFEPime  (MAXIPIME ) IV      Scheduled Medications:   stroke: early stages of recovery book   Does not apply Once   albuterol   2.5 mg Nebulization BID   atorvastatin   80 mg Oral Daily    Chlorhexidine  Gluconate Cloth  6 each Topical Q0600   clopidogrel   75 mg Oral Daily   heparin   5,000 Units Subcutaneous Q8H   insulin  aspart  0-15 Units Subcutaneous TID WC   insulin  aspart  0-5 Units Subcutaneous QHS   insulin  aspart  5 Units Subcutaneous TID WC   insulin  glargine-yfgn  30 Units Subcutaneous Daily   lactulose   20 g Oral BID   midodrine   2.5 mg Oral TID WC   omeprazole   40 mg Oral Daily   sodium chloride  flush  3 mL Intravenous Q12H   thiamine   100 mg Oral Daily    have reviewed scheduled and prn medications.  Physical Exam: General:NAD, comfortable Heart:RRR, s1s2 nl Lungs:clear b/l, no crackle Abdomen:soft, Non-tender, non-distended Extremities: Trace peripheral edema present Dialysis Access: Right IJ temporary HD catheter  [name] [name] 11/22/2023,11:32 AM  LOS: 11 days

## 2023-11-22 NOTE — Progress Notes (Signed)
 Daily Progress Note   Patient Name: [name]       Date: 11/22/2023 DOB: [birth year]  Age: 68 y.o. MRN#: [id number] Attending Physician: Fairy Frames, MD Primary Care Physician: Arnaez Zapata, Gerardo E, MD Admit Date: 11/11/2023  Reason for Consultation/Follow-up: Establishing goals of care  Length of Stay: 11  Current Medications: Scheduled Meds:    stroke: early stages of recovery book   Does not apply Once   albuterol   2.5 mg Nebulization BID   atorvastatin   80 mg Oral Daily   Chlorhexidine  Gluconate Cloth  6 each Topical Q0600   clopidogrel   75 mg Oral Daily   heparin   5,000 Units Subcutaneous Q8H   insulin  aspart  0-15 Units Subcutaneous TID WC   insulin  aspart  0-5 Units Subcutaneous QHS   insulin  aspart  5 Units Subcutaneous TID WC   insulin  glargine-yfgn  30 Units Subcutaneous Daily   lactulose   20 g Oral BID   midodrine   2.5 mg Oral TID WC   omeprazole   40 mg Oral Daily   sodium chloride  flush  3 mL Intravenous Q12H   thiamine   100 mg Oral Daily    Continuous Infusions:   PRN Meds: acetaminophen  **OR** acetaminophen , HYDROmorphone  (DILAUDID ) injection, ipratropium-albuterol , ondansetron  **OR** ondansetron  (ZOFRAN ) IV  Physical Exam Vitals reviewed.  Constitutional:      General: She is sleeping. She is not in acute distress. HENT:     Head: Normocephalic and atraumatic.  Cardiovascular:     Rate and Rhythm: Normal rate.  Pulmonary:     Effort: Pulmonary effort is normal.  Musculoskeletal:     Comments: Bilateral AKA  Skin:    General: Skin is dry.  Neurological:     Mental Status: She is easily aroused.  Psychiatric:        Mood and Affect: Mood normal.        Behavior: Behavior normal.             Vital Signs: BP (!) 95/55 (BP Location: Left  Wrist)   Pulse 87   Temp 98.3 F (36.8 C) (Oral)   Resp (!) 21   Ht 5' 6 (1.676 m)   Wt 100.3 kg   SpO2 100%   BMI 35.69 kg/m  SpO2: SpO2: 100 % O2 Device: O2 Device: Nasal Cannula O2 Flow  Rate: O2 Flow Rate (L/min): 4 L/min        Palliative Assessment/Data:30%      Patient Active Problem List   Diagnosis Date Noted   Lacunar infarct, acute (HCC) 11/17/2023   PAF (paroxysmal atrial fibrillation) (HCC) 11/12/2023   Severe sepsis due to community acquired pneumonia (HCC) 11/11/2023   Pressure injury of skin 09/14/2023   Ischemic cardiomyopathy 09/14/2023   Acute renal failure superimposed on stage 3a chronic kidney disease (HCC) 09/08/2023   Leukocytosis 09/07/2023   Chronic diastolic CHF (congestive heart failure) (HCC) 09/07/2023   Type 2 diabetes mellitus with hyperlipidemia (HCC) 09/07/2023   Chronic iron  deficiency anemia 09/07/2023   Acute on chronic combined systolic and diastolic CHF (congestive heart failure) (HCC) 09/07/2023   Demand ischemia due to type 2 NSTEMI (HCC) 09/06/2023   Heme positive stool    Rectal polyp    Adenomatous polyp of transverse colon    Gastritis and gastroduodenitis    Phantom limb pain (HCC) 11/09/2017   Other abnormalities of gait and mobility 11/09/2017   S/P BKA (below knee amputation) bilateral (HCC) 08/20/2015   Dental caries 08/20/2015   Constipation 08/15/2015   Depression 08/15/2015   Pain in shoulder 06/28/2015   Hemiparesis (HCC) 01/10/2013   Tobacco abuse    Paresthesia of right arm and leg 12/17/2011   Facial droop 12/15/2011   TIA (transient ischemic attack) 12/15/2011   Syncope 07/02/2011   Lower urinary tract infectious disease 07/02/2011   Normocytic anemia 07/02/2011   Hereditary and idiopathic peripheral neuropathy 04/02/2009   Essential hypertension 04/02/2009   Coronary artery disease involving native coronary artery of native heart without angina pectoris 04/02/2009   HIATAL HERNIA 04/02/2009    Irritable bowel syndrome 04/02/2009   HLD (hyperlipidemia) 05/30/2008   Class 2 obesity 05/30/2008   Cerebrovascular disease 05/30/2008   CVA (cerebral infarction) 05/30/2008   GERD 05/30/2008    Palliative Care Assessment & Plan   Patient Profile: 68 y.o. F with obesity, sCHF EF 20-25%, recent NSTEMI treated medically, DM and hx bilat BKA, HTN, CKD IIIa baseline 1.2 and hx stroke without residual who presented with SOB. The Palliative care team has been asked to support additional goals of care conversations.   Today's Discussion: Patient lying in bed sleeping but woke up when I called her name. She feels good about our discussion yesterday discussing goals of care. Outpatient palliative at discharge.   10:00 am: Spoke with patient's daughter Alissa Pouch. She is concerned with the patient's decision to proceed with dialysis. She understands that the patient may not tolerate dialysis and that it will likely not improve her quality of life. She is worried the patient is so frail that she might die in a dialysis session.  Tameika's greatest wish is that her aunt does not suffer at her end-of-life. Tameika worries that her aunt is influenced by other family members that support aggressive treatment measures up until the end of life.  She notes her grandmother the patient's mother received aggressive therapy up until her death. Although she does not agree with the decision she wants to support her aunt and her decision making. Emotional support and therapeutic listening provided.  Encouraged both patient and family to call PMT with questions or concerns.  Recommendations/Plan: Full code Full scope Continue present care Outpatient palliative at discharge- TOC order placed Spiritual Care consult placed Continued PMT support   Code Status:    Code Status Orders  (From admission, onward)  Start     Ordered   11/11/23 1841  Full code  Continuous       Question:  By:   Answer:  Other   11/11/23 1843         Extensive chart review has been completed prior to seeing the patient including labs, vital signs, imaging, progress/consult notes, orders, medications, and available advance directive documents.  Care plan was discussed with Dr. Fairy  Time spent: 50 minutes  Thank you for allowing the Palliative Medicine Team to assist in the care of this patient.    Stephane CHRISTELLA Palin, NP  Please contact Palliative Medicine Team phone at [phone] for questions and concerns.

## 2023-11-22 NOTE — Progress Notes (Signed)
 Patient does not appear to be in any respiratory distress at this time. Bipap in room on standby.

## 2023-11-22 NOTE — Progress Notes (Signed)
 PROGRESS NOTE    [name]  FMW:[id number] DOB: 05/10/1956 DOA: 11/11/2023 PCP: Ellaree Hansel [name], MD   68 y.o. F with obesity, sCHF EF 20-25%, recent NSTEMI treated medically, DM and hx bilat BKA, HTN, CKD IIIa baseline 1.2 and hx stroke without residual who presented with SOB.  -Recently admitted for NSTEMI/CHF, underwent LHC that showed severe calcific disease, 80% mid-LAD, 90% prox RCA and 90% ramus intermedius, no PCI due to technically challenging targets, not CABG candidate.  EF 20-25%. -Recovered afterwards and was feeling well, until a day or two before admission, became SOB, sent from facility to hospital.    -In the ER, CXR showed bilateral opacities. Lactate >5.  New atrial fibrillation.  Suspected CHF, pneumonia sepsis, started on antibiotics, diuretics, Cardiology consulted.  -Subsequent hospital course complicated by renal failure.  CVC placed 6/30, initiated dialysis 7/1. -6/30 after temporary HD catheter was placed patient was noted to be nonverbal, MRI obtained noted 5 mm stroke in right frontal lobe, EEG negative - Palliative care following -7/2: Abx stopped, 7days completed -7/3, respiratory distress, stabilized after pulmonary toilet    Subjective: - Congested, slight worsening cough  Assessment and Plan:  Severe sepsis due to community acquired pneumonia (HCC) Acute respiratory failure with hypoxia Presented with tachycardia, tachypnea, leukocytosis, and respiratory failure with lactic acid greater than 5.  Chest x-ray shows bilateral infiltrates.  Procalcitonin 0.6, trended up to >2. -  completed Abx on 7/2 - Bronchodilators, pulmonary toilet - Continue midodrine  - Now with worsening cough and leukocytosis, concern for aspiration, restart IV Unasyn, check blood cultures, x-ray, SLP eval - Has right IJ HD catheter as well  Acute renal failure superimposed on stage 3a chronic kidney disease (HCC) Baseline creatinine appears to be around 1.2 until her  recent admission for CHF and NSTEMI, -now developed oliguric renal failure, likely ischemic ATN in the setting of IV contrast, sepsis, vancomycin , hypotension on 6/27 and diuresis for CHF. -Renal US  unremarkable.   -Vanc stopped, creatinine continued trending up. Nephrology consulted, HD catheter placed started hemodialysis 7/1 -Nephrology and palliative care following -poor candidate for longterm HD, called and updated niece, palliative meeting completed yesterday, patient wishes for full code and full scope  Acute on chronic combined systolic and diastolic CHF (congestive heart failure) [facility]) -  Cardiology consulted have since signed off.  EF 20-25% with advanced CAD not amenable to intervention. - Now complicated by AKI started HD, GDMT limited by hypotension -Dr.Danford d/w advanced heart failure team again on 7/1, given multiorgan failure, they do not recommend any additional interventions at this time  Acute CVA, lacunar infarct Acute metabolic encephalopathy At baseline, patient has no known cognitive impairment.   - On 6/30 noted to be aphasic, less responsive  -MRI brain was obtained that showed small lacunar infarct.  In right frontal lobe  -Consensus from neurology is that infarct likely incidental. -EEG negative for seizures, noted moderate diffuse encephalopathy  -Neurology recommended aspirin  and Plavix  for 3 weeks followed by Plavix  alone - Follow-up carotid duplex, LDL is 61, A1c 7 - PT OT following - Aphasia has improved as of 7/5  Chronic iron  deficiency anemia Hemoglobin trending down in the setting of renal failure and phlebotomy. No clinical bleeding observed or reported. - Follow Hgb   Type 2 diabetes mellitus with hyperlipidemia (HCC) Remains somewhat hyperglycemic - Hold metformin , GLP-1 - Increase glargine, add meal coverage, SSI  Essential hypertension Cerebrovascular disease Hypotensive earlier in hospital stay, currently stable on midodrine  - Continue  midodrine  - Holding Lasix , Isordil , hydralazine , metoprolol    HLD (hyperlipidemia) - Continue Lipitor   S/P BKA (below knee amputation) bilateral (HCC)   PAF (paroxysmal atrial fibrillation) (HCC) Previous ILR ~10 yrs ago showed no Afib.  On admission this time, transient Afib, now resolved.  In setting of very brief transient episode during sepsis, will defer [facility] for now. - Monitor on tele  Demand ischemia due to type 2 NSTEMI [facility]) Severe calcific coronary artery disease with chronic angina Evaluated by Cardiology.  They suspected type 2 NSTEMI, not ACS.   - Continue Lipitor , Plavix   Class 2 obesity BMI 38, morbid obesity in the setting of comorbid diabetes, hypertension  DVT prophylaxis: hep SQ Code Status: Full Code Family Communication: none present, called and updated neice 7/4 Disposition Plan: TBD  Consultants:    Procedures:   Antimicrobials:    Objective: Vitals:   11/21/23 1708 11/21/23 2059 11/22/23 0011 11/22/23 0736  BP: (!) 103/49   (!) 95/55  Pulse:  94  87  Resp: (!) 22 (!) 29  (!) 21  Temp: 98.8 F (37.1 C)  97.8 F (36.6 C) 98.3 F (36.8 C)  TempSrc: Oral  Oral Oral  SpO2:    100%  Weight:      Height:        Intake/Output Summary (Last 24 hours) at 11/22/2023 1025 Last data filed at 11/21/2023 2231 Gross per 24 hour  Intake 240 ml  Output --  Net 240 ml   Filed Weights   11/20/23 1248 11/20/23 1658 11/21/23 0427  Weight: 99.8 kg 98.3 kg 100.3 kg    Examination:  General exam: Obese, chronically ill laying in bed, awake alert, oriented to self and place, talking today HEENT: Neck obese unable to assess JVD, right IJ central line, HD cath CVS: S1-S2, regular rhythm Lungs: Scattered rhonchi Abdomen: Soft, nontender, bowel sounds present Extremities: Bilateral BKA Neuro: Awake and alert, oriented, moves all extremities no other localizing signs Skin: No rashes Psychiatry: Flat affect    Data Reviewed:   CBC: Recent Labs  Lab  11/17/23 0224 11/18/23 0226 11/19/23 0224 11/20/23 0232 11/22/23 0743  WBC 14.4* 16.4* 16.5* 19.8* 22.5*  HGB 8.6* 8.5* 9.0* 9.1* 9.6*  HCT 28.9* 26.8* 29.3* 29.2* 31.4*  MCV 88.9 83.2 86.9 85.1 86.5  PLT 201 229 230 217 191   Basic Metabolic Panel: Recent Labs  Lab 11/16/23 0307 11/17/23 0224 11/18/23 0226 11/19/23 0224 11/20/23 0232 11/22/23 0347  NA 136 138 137 138 139 135  K 4.5 4.5 4.2 4.3 4.4 3.8  CL 105 104 100 102 105 96*  CO2 13* 16* 20* 20* 21* 21*  GLUCOSE 205* 240* 106* 133* 210* 333*  BUN 70* 80* 52* 31* 49* 47*  CREATININE 4.94* 5.94* 4.53* 3.58* 4.78* 4.81*  CALCIUM  7.6* 7.3* 7.3* 7.8* 7.4* 7.3*  PHOS 5.8* 6.4* 4.9* 4.7* 5.0*  --    GFR: Estimated Creatinine Clearance: 13.2 mL/min (A) (by C-G formula based on SCr of 4.81 mg/dL (H)). Liver Function Tests: Recent Labs  Lab 11/16/23 0307 11/17/23 0224 11/18/23 0226 11/19/23 0224 11/20/23 0232  ALBUMIN  2.5* 2.3* 2.4* 2.4* 2.5*   No results for input(s): LIPASE, AMYLASE in the last 168 hours. No results for input(s): AMMONIA in the last 168 hours. Coagulation Profile: No results for input(s): INR, PROTIME in the last 168 hours. Cardiac Enzymes: No results for input(s): CKTOTAL, CKMB, CKMBINDEX, TROPONINI in the last 168 hours. BNP (last 3 results) No results for input(s): PROBNP in the  last 8760 hours. HbA1C: No results for input(s): HGBA1C in the last 72 hours.  CBG: Recent Labs  Lab 11/21/23 1133 11/21/23 1408 11/21/23 1653 11/21/23 2141 11/22/23 0649  GLUCAP 267* 300* 310* 330* 324*   Lipid Profile: No results for input(s): CHOL, HDL, LDLCALC, TRIG, CHOLHDL, LDLDIRECT in the last 72 hours.  Thyroid  Function Tests: No results for input(s): TSH, T4TOTAL, FREET4, T3FREE, THYROIDAB in the last 72 hours. Anemia Panel: No results for input(s): VITAMINB12, FOLATE, FERRITIN, TIBC, IRON , RETICCTPCT in the last 72 hours. Urine analysis:     Component Value Date/Time   COLORURINE YELLOW 11/14/2023 1641   APPEARANCEUR CLOUDY (A) 11/14/2023 1641   LABSPEC >1.030 (H) 11/14/2023 1641   PHURINE 5.0 11/14/2023 1641   GLUCOSEU 100 (A) 11/14/2023 1641   HGBUR SMALL (A) 11/14/2023 1641   BILIRUBINUR NEGATIVE 11/14/2023 1641   KETONESUR 15 (A) 11/14/2023 1641   PROTEINUR >=300 11/14/2023 1641   UROBILINOGEN 0.2 12/31/2014 1928   NITRITE NEGATIVE 11/14/2023 1641   LEUKOCYTESUR SMALL (A) 11/14/2023 1641   Sepsis Labs: @LABRCNTIP (procalcitonin:4,lacticidven:4)  ) Recent Results (from the past 240 hours)  MRSA Next Gen by PCR, Nasal     Status: None   Collection Time: 11/13/23  6:51 AM   Specimen: Nasal Mucosa; Nasal Swab  Result Value Ref Range Status   MRSA by PCR Next Gen NOT DETECTED NOT DETECTED Final    Comment: (NOTE) The GeneXpert MRSA Assay (FDA approved for NASAL specimens only), is one component of a comprehensive MRSA colonization surveillance program. It is not intended to diagnose MRSA infection nor to guide or monitor treatment for MRSA infections. Test performance is not FDA approved in patients less than 14 years old. Performed at [facility], [address], [place], [place] [address]      Radiology Studies: No results found.    Scheduled Meds:   stroke: early stages of recovery book   Does not apply Once   albuterol   2.5 mg Nebulization BID   atorvastatin   80 mg Oral Daily   Chlorhexidine  Gluconate Cloth  6 each Topical Q0600   clopidogrel   75 mg Oral Daily   heparin   5,000 Units Subcutaneous Q8H   insulin  aspart  0-15 Units Subcutaneous TID WC   insulin  aspart  0-5 Units Subcutaneous QHS   insulin  aspart  5 Units Subcutaneous TID WC   insulin  glargine-yfgn  30 Units Subcutaneous Daily   lactulose   20 g Oral BID   midodrine   2.5 mg Oral TID WC   omeprazole   40 mg Oral Daily   sodium chloride  flush  3 mL Intravenous Q12H   thiamine   100 mg Oral Daily   Continuous Infusions:      LOS: 11 days    Time spent:    Sigurd Pac, MD [facility]   11/22/2023, 10:25 AM

## 2023-11-23 DIAGNOSIS — Z515 Encounter for palliative care: Secondary | ICD-10-CM | POA: Diagnosis not present

## 2023-11-23 DIAGNOSIS — Z7189 Other specified counseling: Secondary | ICD-10-CM | POA: Diagnosis not present

## 2023-11-23 LAB — BASIC METABOLIC PANEL WITH GFR
Anion gap: 18 — ABNORMAL HIGH (ref 5–15)
BUN: 68 mg/dL — ABNORMAL HIGH (ref 8–23)
CO2: 20 mmol/L — ABNORMAL LOW (ref 22–32)
Calcium: 7.2 mg/dL — ABNORMAL LOW (ref 8.9–10.3)
Chloride: 99 mmol/L (ref 98–111)
Creatinine, Ser: 5.82 mg/dL — ABNORMAL HIGH (ref 0.44–1.00)
GFR, Estimated: 7 mL/min — ABNORMAL LOW (ref >60)
Glucose, Bld: 207 mg/dL — ABNORMAL HIGH (ref 70–99)
Potassium: 2.5 mmol/L — CL (ref 3.5–5.1)
Sodium: 137 mmol/L (ref 135–145)

## 2023-11-23 LAB — CBC
HCT: 30.9 % — ABNORMAL LOW (ref 36.0–46.0)
Hemoglobin: 9.4 g/dL — ABNORMAL LOW (ref 12.0–15.0)
MCH: 26.6 pg (ref 26.0–34.0)
MCHC: 30.4 g/dL (ref 30.0–36.0)
MCV: 87.5 fL (ref 80.0–100.0)
Platelets: 197 K/uL (ref 150–400)
RBC: 3.53 MIL/uL — ABNORMAL LOW (ref 3.87–5.11)
RDW: 21.5 % — ABNORMAL HIGH (ref 11.5–15.5)
WBC: 25.4 K/uL — ABNORMAL HIGH (ref 4.0–10.5)
nRBC: 1.3 % — ABNORMAL HIGH (ref 0.0–0.2)

## 2023-11-23 LAB — URINALYSIS, ROUTINE W REFLEX MICROSCOPIC
Bilirubin Urine: NEGATIVE
Glucose, UA: NEGATIVE mg/dL
Ketones, ur: 5 mg/dL — AB
Nitrite: NEGATIVE
Protein, ur: 100 mg/dL — AB
Specific Gravity, Urine: 1.025 (ref 1.005–1.030)
pH: 5 (ref 5.0–8.0)

## 2023-11-23 LAB — GLUCOSE, CAPILLARY
Glucose-Capillary: 197 mg/dL — ABNORMAL HIGH (ref 70–99)
Glucose-Capillary: 231 mg/dL — ABNORMAL HIGH (ref 70–99)
Glucose-Capillary: 270 mg/dL — ABNORMAL HIGH (ref 70–99)
Glucose-Capillary: 198 mg/dL — ABNORMAL HIGH (ref 70–99)

## 2023-11-23 MED ORDER — POTASSIUM CHLORIDE CRYS ER 20 MEQ PO TBCR
40.0000 meq | EXTENDED_RELEASE_TABLET | Freq: Once | ORAL | Status: AC
  Administered 2023-11-23: 40 meq via ORAL
  Filled 2023-11-23: qty 2

## 2023-11-23 MED ORDER — GERHARDT'S BUTT CREAM
TOPICAL_CREAM | Freq: Two times a day (BID) | CUTANEOUS | Status: DC
  Filled 2023-11-23: qty 60

## 2023-11-23 MED ORDER — INSULIN ASPART 100 UNIT/ML IJ SOLN
8.0000 [IU] | Freq: Three times a day (TID) | INTRAMUSCULAR | Status: DC
  Administered 2023-11-23 – 2023-11-24 (×2): 8 [IU] via SUBCUTANEOUS

## 2023-11-23 MED ORDER — FAMOTIDINE 20 MG PO TABS
20.0000 mg | ORAL_TABLET | Freq: Two times a day (BID) | ORAL | Status: DC
  Administered 2023-11-23: 20 mg via ORAL
  Filled 2023-11-23: qty 1

## 2023-11-23 NOTE — Progress Notes (Signed)
 Speech Language Pathology Treatment: Dysphagia;Cognitive-Linguistic  Patient Details Name: [name] MRN: [id number] DOB: Oct 31, 1955 Today's Date: 11/23/2023 Time: 9061-9045 SLP Time Calculation (min) (ACUTE ONLY): 16 min  Assessment / Plan / Recommendation Clinical Impression  Pt awake and alert, able to converse with SLP although still reports some difficulty with word-finding. Pt's expressive and receptive language has progressed to the point of 100% intelligibility at the word level and 100% accuracy with auditory comprehension tasks. She is inattentive to the L side but can direct attention there when given Min cueing. Her affect is flat and she intermittently needs up to Mod cueing to initiate responses. Given improvement related to language, SLP plans to f/u for further assessment of cognitive abilities.   In regards to swallowing, pt reports no difficulty with current diet. She coughs before having POs with congested-sounding breathing. Trialed advanced (Dys 3) solids, which resulted in mildly prolonged oral transit but this is improved when SLP provided smaller pieces. This is suspected to be contributed to by tachypnea with RR increasing as pt puts forth more effort to masticate solids. A sip of nectar thick liquids assists with mastication and oral clearance, and while it appears to intermittently cause immediate coughing, this may not be directly related to penetration/aspiration given frequent coughing on MBS and coughing before POs. Recommend upgrading to Dys 2 solids and continuing nectar thick liquids. SLP will continue following to assess readiness to repeat MBS as respiratory status improves.    HPI HPI: [name] is a 68 yo female presenting to ED 6/25 with SOB after recent admission for NSTEMI (not a candidate for CABG or PCI). Admitted with sepsis and CAP. CT Chest shows extensive patchy and ground-glass opacity in both lungs with progression, compatible with worsening  pulmonary edema and possible PNA. During HD cath placement 6/30, there was a sudden change to nonverbal and subsequent MRI showed R frontal CVA. Failed swallow screen 7/1 but unable to coordinate evaluation due to intermittent BiPAP use and HD. Previously seen by SLP in 2013 and 2014 with a functional appearing swallow and memory deficits. PMH includes SOB, NSTEMI, prior CVA, CKD 3A, bil BKA, HTN, DM, IBS, anemia, and diabetic retinopathy      SLP Plan  Continue with current plan of care          Recommendations  Diet recommendations: Dysphagia 2 (fine chop);Nectar-thick liquid Liquids provided via: Straw;Cup Medication Administration: Crushed with puree Supervision: Full supervision/cueing for compensatory strategies Compensations: Slow rate;Small sips/bites Postural Changes and/or Swallow Maneuvers: Seated upright 90 degrees                  Oral care BID;Staff/trained caregiver to provide oral care   Frequent or constant Supervision/Assistance Dysphagia, oropharyngeal phase (R13.12);Cognitive communication deficit (M58.158)     Continue with current plan of care     Damien Blumenthal, M.A., CCC-SLP Speech Language Pathology, Acute Rehabilitation Services  Secure Chat preferred [phone]  11/23/2023, 10:26 AM

## 2023-11-23 NOTE — Plan of Care (Signed)
  Problem: Coping: Goal: Ability to adjust to condition or change in health will improve Outcome: Progressing   Problem: Pain Managment: Goal: General experience of comfort will improve and/or be controlled Outcome: Progressing   Problem: Safety: Goal: Ability to remain free from injury will improve Outcome: Progressing

## 2023-11-23 NOTE — Progress Notes (Signed)
 Pt's case discussed with nephrologist. Pt will need to be able to sit in chair for HD and tolerate to be appropriate for out-pt HD. Will assist with out-pt HD referral should pt be deemed appropriate. Pt's snf prefers [facility], [place], or [facility] (per CSW) should pt need HD at d/c. Will assist as needed.   Randine Mungo Renal Navigator [phone]

## 2023-11-23 NOTE — Plan of Care (Signed)
   Problem: Health Behavior/Discharge Planning: Goal: Ability to manage health-related needs will improve Outcome: Progressing

## 2023-11-23 NOTE — Progress Notes (Addendum)
 [facility] NEPHROLOGY PROGRESS NOTE  Assessment/ Plan: Pt is a 68 y.o. yo female  w/ DM with retinopathy and peripheral neuropathy, HTN, PAD with B/L AKA, HLD, CVA, CKD 3A as below who presented to ED sent from SNF (x6y resident) due to SOB 11/11/2023.   # AKI on CKD 3b: b/l creat is 1.2- 1.7 from April - June 2025, eGFR 32- 48 ml/min.  She had IV contrast on 6/25 and then had multiple episodes of hypotension.  AKI likely due to ischemic ATN/contrast and hypotension and advanced CHF. -HD started on 7/1 after placement of right IJ temporary HD catheter on 6/30.  -Last HD on 7/4 with 1.5 L UF, tolerated well.  Plan for next dialysis today. -C/s IR for [facility] given no evidence of renal recovery to date.  - Given advanced CHF not amenable for intervention per cardiology, the patient may not tolerate outpatient dialysis well and the quality of life may not improve.  The palliative care team met with family and patient decided to continue full scope of treatment including dialysis.  Will need to see if she can tolerate next HD in chair - with new PNA yesterday will do on Wed tx.  Alerted SW of outpt CLIP needed for AKI  # BP/volume: UF with HD, on midodrine  10 TID for hypotension. BP 80-90s.   # Anemia: Ferritin level high, hemoglobin stable 9s.  Monitor.  /# Sepsis due to pneumonia, acute respiratory failure with hypoxia: Completed 7 days of cefepime .  7/6 cefepime  resumed because of possible aspiration pneumonia.  #hypokalemia: po supplement provided today, check AM labs; HD 4K.   Agree with palliative following along - d/w provider and will reach out if she's not tolerating dialysis so they can reengage.   ADDENDUM: d/w primary MD - due to worsening leukocytosis with CXR unrevealing, plan is to d/c temp HD catheter after today's HD in case that's the source as she'll need perm cath anyway.  Blood cultures 7/6 x 2 are NGTD. IV team c/s/D/C central line order placed with details to do  after HD today  Subjective: Seen and examined.  No significant urine output.  Trying to have BM currently - I called for a bedpan fo rher.  Denies nausea, vomiting.  No other new event.  Objective Vital signs in last 24 hours: Vitals:   11/23/23 0400 11/23/23 0500 11/23/23 0600 11/23/23 0711  BP:  (!) 99/57  (!) 89/42  Pulse: 81 81 74 77  Resp: (!) 36 (!) 35 (!) 29 (!) 21  Temp:    97.8 F (36.6 C)  TempSrc:    Oral  SpO2: 100% 100% 100% 100%  Weight:  102.8 kg    Height:       Weight change:   Intake/Output Summary (Last 24 hours) at 11/23/2023 0917 Last data filed at 11/23/2023 0733 Gross per 24 hour  Intake 240.93 ml  Output 0 ml  Net 240.93 ml       Labs: RENAL PANEL Recent Labs  Lab 11/17/23 0224 11/18/23 0226 11/19/23 0224 11/20/23 0232 11/22/23 0347 11/23/23 0354  NA 138 137 138 139 135 137  K 4.5 4.2 4.3 4.4 3.8 2.5*  CL 104 100 102 105 96* 99  CO2 16* 20* 20* 21* 21* 20*  GLUCOSE 240* 106* 133* 210* 333* 207*  BUN 80* 52* 31* 49* 47* 68*  CREATININE 5.94* 4.53* 3.58* 4.78* 4.81* 5.82*  CALCIUM  7.3* 7.3* 7.8* 7.4* 7.3* 7.2*  PHOS 6.4* 4.9* 4.7* 5.0*  --   --  ALBUMIN  2.3* 2.4* 2.4* 2.5*  --   --     Liver Function Tests: Recent Labs  Lab 11/18/23 0226 11/19/23 0224 11/20/23 0232  ALBUMIN  2.4* 2.4* 2.5*   No results for input(s): LIPASE, AMYLASE in the last 168 hours. No results for input(s): AMMONIA in the last 168 hours. CBC: Recent Labs    09/09/23 1101 09/10/23 0415 11/15/23 0432 11/15/23 1527 11/18/23 0226 11/19/23 0224 11/20/23 0232 11/22/23 0743 11/23/23 0354  HGB  --    < >  --    < > 8.5* 9.0* 9.1* 9.6* 9.4*  MCV  --    < >  --    < > 83.2 86.9 85.1 86.5 87.5  VITAMINB12 578  --   --   --   --   --   --   --   --   FOLATE 12.8  --   --   --   --   --   --   --   --   FERRITIN 275  --  1,093*  --   --   --   --   --   --   TIBC 209*  --  192*  --   --   --   --   --   --   IRON  24*  --  54  --   --   --   --   --   --    RETICCTPCT 1.3  --   --   --   --   --   --   --   --    < > = values in this interval not displayed.    Cardiac Enzymes: No results for input(s): CKTOTAL, CKMB, CKMBINDEX, TROPONINI in the last 168 hours. CBG: Recent Labs  Lab 11/22/23 0649 11/22/23 1238 11/22/23 1641 11/22/23 2153 11/23/23 0644  GLUCAP 324* 247* 274* 281* 197*    Iron  Studies: No results for input(s): IRON , TIBC, TRANSFERRIN, FERRITIN in the last 72 hours. Studies/Results: DG Chest 2 View Result Date: 11/22/2023 CLINICAL DATA:  Hypoxia EXAM: CHEST - 2 VIEW COMPARISON:  None Available. FINDINGS: Stable enlarged cardiac silhouette. Central venous line unchanged. Low lung volumes. No effusion, infiltrate or pneumothorax identified. Some limitation due to body habitus and single view. IMPRESSION: Low lung volumes. No acute cardiopulmonary process. Electronically Signed   By: Jackquline Boxer M.D.   On: 11/22/2023 13:02     Medications: Infusions:  ceFEPime  (MAXIPIME ) IV Stopped (11/22/23 1253)    Scheduled Medications:   stroke: early stages of recovery book   Does not apply Once   albuterol   2.5 mg Nebulization BID   atorvastatin   80 mg Oral Daily   Chlorhexidine  Gluconate Cloth  6 each Topical Q0600   clopidogrel   75 mg Oral Daily   heparin   5,000 Units Subcutaneous Q8H   insulin  aspart  0-15 Units Subcutaneous TID WC   insulin  aspart  0-5 Units Subcutaneous QHS   insulin  aspart  5 Units Subcutaneous TID WC   insulin  glargine-yfgn  30 Units Subcutaneous Daily   lactulose   20 g Oral BID   midodrine   10 mg Oral TID WC   omeprazole   40 mg Oral Daily   sodium chloride  flush  3 mL Intravenous Q12H   thiamine   100 mg Oral Daily    have reviewed scheduled and prn medications.  Physical Exam: General:NAD, comfortable Heart:RRR, s1s2 nl Lungs:clear b/l, no crackle but rhonchi BL ant, normal WOB Abdomen:soft,  Non-tender, non-distended Extremities: Trace peripheral edema present Dialysis  Access: Right IJ temporary HD catheter   Manuelita DELENA Barters 11/23/2023,9:17 AM  LOS: 12 days

## 2023-11-23 NOTE — TOC Progression Note (Addendum)
 Transition of Care [facility]) - Progression Note    Patient Details  Name: [name] MRN: [id number] Date of Birth: [birth year]  [facility] of [facility]) CM/SW Contact  Luise JAYSON Pan, [place] Phone Number: 11/23/2023, 10:49 AM  Clinical Narrative:   Per MD in progression, patient is not medically ready for DC. Facility updated and ordered a bipap for patient for when patient returns to facility.   TOC will continue to follow.    Expected Discharge Plan: Long Term Nursing Home Barriers to Discharge: Continued Medical Work up  Expected Discharge Plan and Services In-house Referral: Clinical Social Work     Living arrangements for the past 2 months: Skilled Nursing Facility                                       Social Determinants of Health (SDOH) Interventions SDOH Screenings   Food Insecurity: No Food Insecurity (11/12/2023)  Housing: Low Risk  (11/12/2023)  Transportation Needs: No Transportation Needs (11/12/2023)  Utilities: Not At Risk (11/12/2023)  Social Connections: Unknown (11/12/2023)  Tobacco Use: Medium Risk (11/11/2023)    Readmission Risk Interventions     No data to display

## 2023-11-23 NOTE — Progress Notes (Signed)
 PROGRESS NOTE    [name]  FMW:[id number] DOB: April 25, 1956 DOA: 11/11/2023 PCP: Ellaree Hansel [name], MD   68 y.o. F with obesity, sCHF EF 20-25%, recent NSTEMI treated medically, DM and hx bilat BKA, HTN, CKD IIIa baseline 1.2 and hx stroke without residual who presented with SOB.  -Recently admitted for NSTEMI/CHF, underwent LHC that showed severe calcific disease, 80% mid-LAD, 90% prox RCA and 90% ramus intermedius, no PCI due to technically challenging targets, not CABG candidate.  EF 20-25%. -Recovered afterwards and was feeling well, until a day or two before admission, became SOB, sent from facility to hospital.    -In the ER, CXR showed bilateral opacities. Lactate >5.  New atrial fibrillation.  Suspected CHF, pneumonia sepsis, started on antibiotics, diuretics, Cardiology consulted.  -Subsequent hospital course complicated by renal failure.  CVC placed 6/30, initiated dialysis 7/1. -6/30 after temporary HD catheter was placed patient was noted to be nonverbal, MRI obtained noted 5 mm stroke in right frontal lobe, EEG negative - Palliative care following -7/2: Abx stopped, 7days completed -7/3, respiratory distress, stabilized after pulmonary toilet    Subjective: - Still has some cough, congestion  Assessment and Plan:  Severe sepsis due to community acquired pneumonia (HCC) Acute respiratory failure with hypoxia Presented with tachycardia, tachypnea, leukocytosis, and respiratory failure with lactic acid greater than 5.  Chest x-ray shows bilateral infiltrates.  Procalcitonin 0.6, trended up to >2. -Completed antibiotics first course on 7/2 - Bronchodilators, pulmonary toilet -7/6 with worsening cough and leukocytosis, concern for aspiration, restart IV Unasyn, check blood cultures, x-ray largely unremarkable, SLP following - Has right IJ HD catheter as well, will discuss with nephrology regarding line holiday  Acute renal failure superimposed on stage 3a chronic  kidney disease (HCC) Baseline creatinine appears to be around 1.2 until her recent admission for CHF and NSTEMI, -now developed oliguric renal failure, likely ischemic ATN in the setting of IV contrast, sepsis, vancomycin , hypotension on 6/27 and diuresis for CHF. -Renal US  unremarkable.   -Nephrology consulted, HD catheter placed started hemodialysis 7/1 -Palliative care following -poor candidate for longterm HD, called and updated niece recommended hospice, palliative meeting completed 7/6, patient wishes for full code and full scope  Acute on chronic combined systolic and diastolic CHF (congestive heart failure) [facility]) -  Cardiology consulted have since signed off.  EF 20-25% with advanced CAD not amenable to intervention. - Now complicated by AKI started HD, GDMT limited by hypotension, now ESRD -Dr.Danford d/w advanced heart failure team again on 7/1, given multiorgan failure, they do not recommend any additional interventions at this time  Acute CVA, lacunar infarct Acute metabolic encephalopathy At baseline, patient has no known cognitive impairment.   - On 6/30 noted to be aphasic, less responsive  -MRI brain was obtained that showed small lacunar infarct.  In right frontal lobe  -Consensus from neurology is that infarct likely incidental. -EEG negative for seizures, noted moderate diffuse encephalopathy  -Neurology recommended aspirin  and Plavix  for 3 weeks followed by Plavix  alone - Follow-up carotid duplex, LDL is 61, A1c 7 - PT OT following - Aphasia has improved as of 7/5  Chronic iron  deficiency anemia Hemoglobin trending down in the setting of renal failure and phlebotomy. No clinical bleeding observed or reported. - Follow Hgb   Type 2 diabetes mellitus with hyperlipidemia (HCC) Remains somewhat hyperglycemic - Hold metformin , GLP-1 - Increase glargine, add meal coverage, SSI  Essential hypertension Cerebrovascular disease Hypotensive earlier in hospital stay,  currently stable on  midodrine  - Continue midodrine  - Holding Lasix , Isordil , hydralazine , metoprolol    HLD (hyperlipidemia) - Continue Lipitor   S/P BKA (below knee amputation) bilateral (HCC)   PAF (paroxysmal atrial fibrillation) (HCC) Previous ILR ~10 yrs ago showed no Afib.  On admission this time, transient Afib, now resolved.  In setting of very brief transient episode during sepsis, will defer [facility] for now. - Monitor on tele  Demand ischemia due to type 2 NSTEMI [facility]) Severe calcific coronary artery disease with chronic angina Evaluated by Cardiology.  They suspected type 2 NSTEMI, not ACS.   - Continue Lipitor , Plavix   Class 2 obesity BMI 38, morbid obesity in the setting of comorbid diabetes, hypertension  DVT prophylaxis: hep SQ Code Status: Full Code Family Communication: none present, called and updated neice 7/5 Disposition Plan: TBD    Objective: Vitals:   11/23/23 0400 11/23/23 0500 11/23/23 0600 11/23/23 0711  BP:  (!) 99/57  (!) 89/42  Pulse: 81 81 74 77  Resp: (!) 36 (!) 35 (!) 29 (!) 21  Temp:    97.8 F (36.6 C)  TempSrc:    Oral  SpO2: 100% 100% 100% 100%  Weight:  102.8 kg    Height:        Intake/Output Summary (Last 24 hours) at 11/23/2023 1206 Last data filed at 11/23/2023 9083 Gross per 24 hour  Intake 237.93 ml  Output 10 ml  Net 227.93 ml   Filed Weights   11/20/23 1658 11/21/23 0427 11/23/23 0500  Weight: 98.3 kg 100.3 kg 102.8 kg    Examination:  General exam: Obese chronically ill female laying in bed, AAO x 2,  HEENT: Neck obese unable to assess JVD, right IJ HD cath CVS: S1-S2,, regular rhythm Lungs: Scattered rhonchi Abdomen: Soft, nontender, bowel sounds present Extremities: Bilateral BKA  Neuro: Awake and alert, oriented, moves all extremities no other localizing signs Skin: No rashes Psychiatry: Flat affect    Data Reviewed:   CBC: Recent Labs  Lab 11/18/23 0226 11/19/23 0224 11/20/23 0232 11/22/23 0743  11/23/23 0354  WBC 16.4* 16.5* 19.8* 22.5* 25.4*  HGB 8.5* 9.0* 9.1* 9.6* 9.4*  HCT 26.8* 29.3* 29.2* 31.4* 30.9*  MCV 83.2 86.9 85.1 86.5 87.5  PLT 229 230 217 191 197   Basic Metabolic Panel: Recent Labs  Lab 11/17/23 0224 11/18/23 0226 11/19/23 0224 11/20/23 0232 11/22/23 0347 11/23/23 0354  NA 138 137 138 139 135 137  K 4.5 4.2 4.3 4.4 3.8 2.5*  CL 104 100 102 105 96* 99  CO2 16* 20* 20* 21* 21* 20*  GLUCOSE 240* 106* 133* 210* 333* 207*  BUN 80* 52* 31* 49* 47* 68*  CREATININE 5.94* 4.53* 3.58* 4.78* 4.81* 5.82*  CALCIUM  7.3* 7.3* 7.8* 7.4* 7.3* 7.2*  PHOS 6.4* 4.9* 4.7* 5.0*  --   --    GFR: Estimated Creatinine Clearance: 11 mL/min (A) (by C-G formula based on SCr of 5.82 mg/dL (H)). Liver Function Tests: Recent Labs  Lab 11/17/23 0224 11/18/23 0226 11/19/23 0224 11/20/23 0232  ALBUMIN  2.3* 2.4* 2.4* 2.5*   No results for input(s): LIPASE, AMYLASE in the last 168 hours. No results for input(s): AMMONIA in the last 168 hours. Coagulation Profile: No results for input(s): INR, PROTIME in the last 168 hours. Cardiac Enzymes: No results for input(s): CKTOTAL, CKMB, CKMBINDEX, TROPONINI in the last 168 hours. BNP (last 3 results) No results for input(s): PROBNP in the last 8760 hours. HbA1C: No results for input(s): HGBA1C in the last 72 hours.  CBG: Recent Labs  Lab 11/22/23 1238 11/22/23 1641 11/22/23 2153 11/23/23 0644 11/23/23 1201  GLUCAP 247* 274* 281* 197* 231*   Lipid Profile: No results for input(s): CHOL, HDL, LDLCALC, TRIG, CHOLHDL, LDLDIRECT in the last 72 hours.  Thyroid  Function Tests: No results for input(s): TSH, T4TOTAL, FREET4, T3FREE, THYROIDAB in the last 72 hours. Anemia Panel: No results for input(s): VITAMINB12, FOLATE, FERRITIN, TIBC, IRON , RETICCTPCT in the last 72 hours. Urine analysis:    Component Value Date/Time   COLORURINE YELLOW 11/14/2023 1641   APPEARANCEUR  CLOUDY (A) 11/14/2023 1641   LABSPEC >1.030 (H) 11/14/2023 1641   PHURINE 5.0 11/14/2023 1641   GLUCOSEU 100 (A) 11/14/2023 1641   HGBUR SMALL (A) 11/14/2023 1641   BILIRUBINUR NEGATIVE 11/14/2023 1641   KETONESUR 15 (A) 11/14/2023 1641   PROTEINUR >=300 11/14/2023 1641   UROBILINOGEN 0.2 12/31/2014 1928   NITRITE NEGATIVE 11/14/2023 1641   LEUKOCYTESUR SMALL (A) 11/14/2023 1641   Sepsis Labs: @LABRCNTIP (procalcitonin:4,lacticidven:4)  ) Recent Results (from the past 240 hours)  Culture, blood (Routine X 2) w Reflex to ID Panel     Status: None (Preliminary result)   Collection Time: 11/22/23  9:09 AM   Specimen: BLOOD LEFT HAND  Result Value Ref Range Status   Specimen Description BLOOD LEFT HAND  Final   Special Requests   Final    BOTTLES DRAWN AEROBIC ONLY Blood Culture results may not be optimal due to an inadequate volume of blood received in culture bottles   Culture   Final    NO GROWTH < 24 HOURS Performed at [facility], [address] [place], [place] [address]    Report Status PENDING  Incomplete  Culture, blood (Routine X 2) w Reflex to ID Panel     Status: None (Preliminary result)   Collection Time: 11/22/23  9:14 AM   Specimen: BLOOD  Result Value Ref Range Status   Specimen Description BLOOD BLOOD RIGHT HAND  Final   Special Requests   Final    AEROBIC BOTTLE ONLY Blood Culture results may not be optimal due to an inadequate volume of blood received in culture bottles   Culture   Final    NO GROWTH < 24 HOURS Performed at [facility], [address], [place], [place] [address]    Report Status PENDING  Incomplete     Radiology Studies: DG Chest 2 View Result Date: 11/22/2023 CLINICAL DATA:  Hypoxia EXAM: CHEST - 2 VIEW COMPARISON:  None Available. FINDINGS: Stable enlarged cardiac silhouette. Central venous line unchanged. Low lung volumes. No effusion, infiltrate or pneumothorax identified. Some limitation due to body habitus and single  view. IMPRESSION: Low lung volumes. No acute cardiopulmonary process. Electronically Signed   By: Jackquline Boxer M.D.   On: 11/22/2023 13:02      Scheduled Meds:   stroke: early stages of recovery book   Does not apply Once   atorvastatin   80 mg Oral Daily   Chlorhexidine  Gluconate Cloth  6 each Topical Q0600   clopidogrel   75 mg Oral Daily   heparin   5,000 Units Subcutaneous Q8H   insulin  aspart  0-15 Units Subcutaneous TID WC   insulin  aspart  0-5 Units Subcutaneous QHS   insulin  aspart  5 Units Subcutaneous TID WC   insulin  glargine-yfgn  30 Units Subcutaneous Daily   lactulose   20 g Oral BID   midodrine   10 mg Oral TID WC   omeprazole   40 mg Oral Daily  sodium chloride  flush  3 mL Intravenous Q12H   thiamine   100 mg Oral Daily   Continuous Infusions:  ceFEPime  (MAXIPIME ) IV Stopped (11/22/23 1253)      LOS: 12 days    Time spent:    Sigurd Pac, MD [facility]   11/23/2023, 12:06 PM

## 2023-11-23 NOTE — Progress Notes (Signed)
 This chaplain responded to PMT NP-Dawn consult for spiritual care and prayer. The Pt. is awake and eating her lunch at the time of the visit. The Pt. fed herself two spoons full of lunch during the visit.  The Pt. is following the TV and responds to the chaplain's questions. Through reflective listening the chaplain understands the Pt. request for prayer is peace. The chaplain learns the Pt. has two sisters. The Pt. is the oldest. The Pt. affirmed her role as the oldest sibling and her sisters' daily visits.  The chaplain's invitation for prayer was accepted along with F/U spiritual care.  Chaplain Leeroy Hummer [phone]

## 2023-11-23 NOTE — Progress Notes (Signed)
   Palliative Medicine Inpatient Follow Up Note HPI: 68 y.o. F with obesity, sCHF EF 20-25%, recent NSTEMI treated medically, DM and hx bilat BKA, HTN, CKD IIIa baseline 1.2 and hx stroke without residual who presented with SOB. The Palliative care team has been asked to support additional goals of care conversations.   Today's Discussion 11/23/2023  *Please note that this is a verbal dictation therefore any spelling or grammatical errors are due to the Dragon Medical One system interpretation.  Chart reviewed inclusive of vital signs, progress notes, laboratory results, and diagnostic images.   I met with [name] this morning. She and I discussed that she feels more congested. I asked her what her understanding of what her current health situation is and she shook her head. I asked if she needed clarity and she said yes.   I reviewed with [name] her current health in the setting of her heart failure and now contrast associated atn requiring a trial of dialysis. We reviewed that it will be important to determine if [name] can tolerate iHD in the sitting position moving into the future. I shared understanding that [name] wants everything done tp save her life but also noted we may get to a point whereby additional measures are not warranted as her body may not be able to tolerate the, We discussed allowing the oncoming days to trial her tolerance of the present treatments.   Created space and opportunity for patient to explore thoughts feelings and fears regarding current medical situation. She shares that she wants to breath better. We reviewed that she has suffered from an aspiration PNA which in addition to her congestion may be contributing to her feeling more breathless.   Questions and concerns addressed/Palliative Support Provided.   Objective Assessment: Vital Signs Vitals:   11/23/23 0600 11/23/23 0711  BP:  (!) 89/42  Pulse: 74 77  Resp: (!) 29 (!) 21  Temp:  97.8 F (36.6  C)  SpO2: 100% 100%    Intake/Output Summary (Last 24 hours) at 11/23/2023 9043 Last data filed at 11/23/2023 9266 Gross per 24 hour  Intake 240.93 ml  Output 0 ml  Net 240.93 ml   Last Weight  Most recent update: 11/23/2023  5:57 AM    Weight  102.8 kg (226 lb 10.1 oz)            Gen:  Elderly AA F chronically ill appearing HEENT: moist mucous membranes CV: Regular rate and rhythm  PULM:  On 6LPM , breathing is even and nonlabored ABD: soft/nontender  EXT: (+) edema BL AKAs Neuro: Aware of person and place - needs clarity on situation  SUMMARY OF RECOMMENDATIONS   Full Code / Full scope of Care  Continue current care  Allowing time for outcomes  Opend and honest conversations held with [name] in regards to her HD treatments and the possibility that her body may not be tolerant of it in the long term  Ongoing PMT support ______________________________________________________________________________________ [name] [place] Palliative Medicine Team Team Cell Phone: [phone] Please utilize secure chat with additional questions, if there is no response within 30 minutes please call the above phone number  Billing based on MDM: Moderate  Palliative Medicine Team providers are available by phone from 7am to 7pm daily and can be reached through the team cell phone.  Should this patient require assistance outside of these hours, please call the patient's attending physician.

## 2023-11-23 NOTE — Inpatient Diabetes Management (Signed)
 Inpatient Diabetes Program Recommendations  AACE/ADA: New Consensus Statement on Inpatient Glycemic Control (2015)  Target Ranges:  Prepandial:   less than 140 mg/dL      Peak postprandial:   less than 180 mg/dL (1-2 hours)      Critically ill patients:  140 - 180 mg/dL   Lab Results  Component Value Date   GLUCAP 231 (H) 11/23/2023   HGBA1C 7.0 (H) 11/17/2023    Latest Reference Range & Units 11/22/23 06:49 11/22/23 12:38 11/22/23 16:41 11/22/23 21:53 11/23/23 06:44 11/23/23 12:01  Glucose-Capillary 70 - 99 mg/dL 675 (H) 752 (H) 725 (H) 281 (H) 197 (H) 231 (H)  (H): Data is abnormally high  Diabetes history: DM2 Outpatient Diabetes medications: Lantus  15 units bid, Trulicity 4.5 mg QWednesday, Humalog 4-14 am and pm, metformin  500 mg Daily  Current orders for Inpatient glycemic control: Semglee  30 units daily Novolog  5 units tid meal coverage Novolog  0-15 units tid, 0-5 units hs correction  Inpatient Diabetes Program Recommendations:   Noted postprandial CBGs >180. Please consider: -Increase Novolog  meal coverage to 8 units tid if eats 50% meal.  Thank you, [name]. Mehki Klumpp, RN, MSN, CDCES  Diabetes Coordinator Inpatient Glycemic Control Team Team Pager [phone] (8am-5pm) 11/23/2023 1:48 PM

## 2023-11-23 NOTE — Progress Notes (Signed)
 Latest Reference Range & Units 11/22/23 03:47 11/23/23 03:54  Potassium 3.5 - 5.1 mmol/L 3.8 2.5 (LL)  (LL): Data is critically low  Rathore MD notified @ 0630, lab called @ [phone]

## 2023-11-24 DIAGNOSIS — Z515 Encounter for palliative care: Secondary | ICD-10-CM | POA: Diagnosis not present

## 2023-11-24 DIAGNOSIS — Z7189 Other specified counseling: Secondary | ICD-10-CM | POA: Diagnosis not present

## 2023-11-24 LAB — CBC
HCT: 27 % — ABNORMAL LOW (ref 36.0–46.0)
Hemoglobin: 8.3 g/dL — ABNORMAL LOW (ref 12.0–15.0)
MCH: 26.8 pg (ref 26.0–34.0)
MCHC: 30.7 g/dL (ref 30.0–36.0)
MCV: 87.1 fL (ref 80.0–100.0)
Platelets: 166 K/uL (ref 150–400)
RBC: 3.1 MIL/uL — ABNORMAL LOW (ref 3.87–5.11)
RDW: 21.3 % — ABNORMAL HIGH (ref 11.5–15.5)
WBC: 25.3 K/uL — ABNORMAL HIGH (ref 4.0–10.5)
nRBC: 0.7 % — ABNORMAL HIGH (ref 0.0–0.2)

## 2023-11-24 LAB — RENAL FUNCTION PANEL
Albumin: 2.2 g/dL — ABNORMAL LOW (ref 3.5–5.0)
Anion gap: 17 — ABNORMAL HIGH (ref 5–15)
BUN: 90 mg/dL — ABNORMAL HIGH (ref 8–23)
CO2: 19 mmol/L — ABNORMAL LOW (ref 22–32)
Calcium: 6.8 mg/dL — ABNORMAL LOW (ref 8.9–10.3)
Chloride: 99 mmol/L (ref 98–111)
Creatinine, Ser: 7.25 mg/dL — ABNORMAL HIGH (ref 0.44–1.00)
GFR, Estimated: 6 mL/min — ABNORMAL LOW (ref >60)
Glucose, Bld: 194 mg/dL — ABNORMAL HIGH (ref 70–99)
Phosphorus: 5.3 mg/dL — ABNORMAL HIGH (ref 2.5–4.6)
Potassium: 2.5 mmol/L — CL (ref 3.5–5.1)
Sodium: 135 mmol/L (ref 135–145)

## 2023-11-24 LAB — GLUCOSE, CAPILLARY
Glucose-Capillary: 210 mg/dL — ABNORMAL HIGH (ref 70–99)
Glucose-Capillary: 98 mg/dL (ref 70–99)
Glucose-Capillary: 161 mg/dL — ABNORMAL HIGH (ref 70–99)

## 2023-11-24 MED ORDER — GLYCOPYRROLATE 0.2 MG/ML IJ SOLN: 0.2000 mg | INTRAMUSCULAR | Status: DC | PRN

## 2023-11-24 MED ORDER — POLYVINYL ALCOHOL 1.4 % OP SOLN: 1.0000 [drp] | Freq: Four times a day (QID) | OPHTHALMIC | Status: DC | PRN

## 2023-11-24 MED ORDER — ALBUMIN HUMAN 25 % IV SOLN
25.0000 g | Freq: Once | INTRAVENOUS | Status: AC
  Administered 2023-11-24: 25 g via INTRAVENOUS

## 2023-11-24 MED ORDER — DEXTROSE 50 % IV SOLN
INTRAVENOUS | Status: AC
  Filled 2023-11-24: qty 50

## 2023-11-24 MED ORDER — HEPARIN SODIUM (PORCINE) 1000 UNIT/ML DIALYSIS: 1000.0000 [IU] | INTRAMUSCULAR | Status: DC | PRN

## 2023-11-24 MED ORDER — LORAZEPAM 2 MG/ML IJ SOLN
0.5000 mg | INTRAMUSCULAR | Status: DC | PRN
  Administered 2023-11-25: 0.5 mg via INTRAVENOUS
  Administered 2023-11-26 – 2023-11-27 (×2): 1 mg via INTRAVENOUS
  Filled 2023-11-24 (×4): qty 1

## 2023-11-24 MED ORDER — HYDROMORPHONE HCL 1 MG/ML IJ SOLN
0.5000 mg | INTRAMUSCULAR | Status: DC | PRN
  Administered 2023-11-25: 0.5 mg via INTRAVENOUS
  Administered 2023-11-25 – 2023-11-26 (×5): 1 mg via INTRAVENOUS
  Filled 2023-11-24 (×6): qty 1

## 2023-11-24 MED ORDER — HEPARIN SODIUM (PORCINE) 1000 UNIT/ML IJ SOLN
INTRAMUSCULAR | Status: AC
  Filled 2023-11-24: qty 2

## 2023-11-24 MED ORDER — HYDROMORPHONE HCL 1 MG/ML IJ SOLN
INTRAMUSCULAR | Status: AC
  Filled 2023-11-24: qty 0.5

## 2023-11-24 MED ORDER — GLYCOPYRROLATE 1 MG PO TABS: 1.0000 mg | ORAL_TABLET | ORAL | Status: DC | PRN

## 2023-11-24 MED ORDER — NALOXONE HCL 0.4 MG/ML IJ SOLN
INTRAMUSCULAR | Status: AC
  Filled 2023-11-24: qty 1

## 2023-11-24 MED ORDER — MIDODRINE HCL 5 MG PO TABS
ORAL_TABLET | ORAL | Status: AC
  Filled 2023-11-24: qty 1

## 2023-11-24 MED ORDER — DEXTROSE 50 % IV SOLN
25.0000 g | Freq: Once | INTRAVENOUS | Status: AC
  Administered 2023-11-24: 25 g via INTRAVENOUS

## 2023-11-24 MED ORDER — BIOTENE DRY MOUTH MT LIQD: 15.0000 mL | OROMUCOSAL | Status: DC | PRN

## 2023-11-24 MED ORDER — ALBUMIN HUMAN 25 % IV SOLN
INTRAVENOUS | Status: AC
  Filled 2023-11-24: qty 200

## 2023-11-24 MED ORDER — GLYCOPYRROLATE 0.2 MG/ML IJ SOLN
0.2000 mg | INTRAMUSCULAR | Status: DC | PRN
Start: 2023-11-24 — End: 2023-11-26
  Administered 2023-11-25 – 2023-11-26 (×4): 0.2 mg via INTRAVENOUS
  Filled 2023-11-24 (×4): qty 1

## 2023-11-24 MED ORDER — HEPARIN SODIUM (PORCINE) 1000 UNIT/ML IJ SOLN
INTRAMUSCULAR | Status: AC
  Filled 2023-11-24: qty 3

## 2023-11-24 MED ORDER — NALOXONE HCL 0.4 MG/ML IJ SOLN
0.4000 mg | Freq: Once | INTRAMUSCULAR | Status: AC
  Administered 2023-11-24: 0.4 mg via INTRAVENOUS

## 2023-11-24 MED ORDER — HEPARIN SODIUM (PORCINE) 1000 UNIT/ML DIALYSIS
20.0000 [IU]/kg | INTRAMUSCULAR | Status: DC | PRN
Start: 2023-11-24 — End: 2023-11-24

## 2023-11-24 NOTE — Progress Notes (Signed)
 [name] NEPHROLOGY PROGRESS NOTE  Assessment/ Plan: Pt is a 68 y.o. yo female  w/ DM with retinopathy and peripheral [name], [name], [name], [name], [name], [name] 3A as below who presented to ED sent from SNF (x6y resident) due to SOB 11/11/2023.   # AKI on [name] 3b: b/l creat is 1.2- 1.7 from April - June 2025, eGFR 32- 48 ml/min.  She had IV contrast on 6/25 and then had multiple episodes of hypotension.  AKI likely due to ischemic ATN/contrast and hypotension and advanced CHF. -HD started on 7/1 after placement of right IJ temporary HD catheter on 6/30.  -Not tolerating HD today and long term prognosis is poor -- recommend against further dialysis -Cancel IR consult for perm cath  # BP/volume: UF with HD, on midodrine  10 TID for hypotension. Not tolerating dialysis as above due to low BP.    # Anemia: Ferritin level high, hemoglobin stable 8-9s.  Monitor.  /# Sepsis due to pneumonia, acute respiratory failure with hypoxia: Completed 7 days of cefepime .  7/6 cefepime  resumed because of possible aspiration pneumonia.  #hypokalemia: supplement and 4K dialysate today  Multiple comorbids and baseline frailty and poor tolerance of dialysis - poor prognosis and recommend against long term dialysis.  Palliative care following - will d/w family and recommend comfort care.  In full agreement.   Subjective: Seen and examined at dialysis.  Received midodrine  pre HD and required albumin  25g x 2 doses during tx for BP support.  Despite this BP into 80s and less responsive with left in tx - d/c tx and rapid response called.  Getting dextrose  for BG 90 when baseline BG 200s, getting midodrine .  No significant urine output.    Objective Vital signs in last 24 hours: Vitals:   11/24/23 0822 11/24/23 0833 11/24/23 0855 11/24/23 0920  BP: (!) 100/54 103/69 97/71 104/63  Pulse: 70 74 73 82  Resp: (!) 27 (!) 30 (!) 31 (!) 27  Temp: (!) 97.5 F (36.4 C)     TempSrc:      SpO2:  100% 100% 100% 100%  Weight:      Height:       Weight change: 0.4 kg  Intake/Output Summary (Last 24 hours) at 11/24/2023 0950 Last data filed at 11/24/2023 0600 Gross per 24 hour  Intake 100 ml  Output --  Net 100 ml       Labs: RENAL PANEL Recent Labs  Lab 11/18/23 0226 11/19/23 0224 11/20/23 0232 11/22/23 0347 11/23/23 0354  NA 137 138 139 135 137  K 4.2 4.3 4.4 3.8 2.5*  CL 100 102 105 96* 99  CO2 20* 20* 21* 21* 20*  GLUCOSE 106* 133* 210* 333* 207*  BUN 52* 31* 49* 47* 68*  CREATININE 4.53* 3.58* 4.78* 4.81* 5.82*  CALCIUM  7.3* 7.8* 7.4* 7.3* 7.2*  PHOS 4.9* 4.7* 5.0*  --   --   ALBUMIN  2.4* 2.4* 2.5*  --   --     Liver Function Tests: Recent Labs  Lab 11/18/23 0226 11/19/23 0224 11/20/23 0232  ALBUMIN  2.4* 2.4* 2.5*   No results for input(s): LIPASE, AMYLASE in the last 168 hours. No results for input(s): AMMONIA in the last 168 hours. CBC: Recent Labs    09/09/23 1101 09/10/23 0415 11/15/23 0432 11/15/23 1527 11/19/23 0224 11/20/23 0232 11/22/23 0743 11/23/23 0354 11/24/23 0825  HGB  --    < >  --    < > 9.0* 9.1* 9.6* 9.4*  8.3*  MCV  --    < >  --    < > 86.9 85.1 86.5 87.5 87.1  VITAMINB12 578  --   --   --   --   --   --   --   --   FOLATE 12.8  --   --   --   --   --   --   --   --   FERRITIN 275  --  1,093*  --   --   --   --   --   --   TIBC 209*  --  192*  --   --   --   --   --   --   IRON  24*  --  54  --   --   --   --   --   --   RETICCTPCT 1.3  --   --   --   --   --   --   --   --    < > = values in this interval not displayed.    Cardiac Enzymes: No results for input(s): CKTOTAL, CKMB, CKMBINDEX, TROPONINI in the last 168 hours. CBG: Recent Labs  Lab 11/23/23 0644 11/23/23 1201 11/23/23 1621 11/23/23 2128 11/24/23 0614  GLUCAP 197* 231* 270* 198* 210*    Iron  Studies: No results for input(s): IRON , TIBC, TRANSFERRIN, FERRITIN in the last 72 hours. Studies/Results: DG Chest 2 View Result Date:  11/22/2023 CLINICAL DATA:  Hypoxia EXAM: CHEST - 2 VIEW COMPARISON:  None Available. FINDINGS: Stable enlarged cardiac silhouette. Central venous line unchanged. Low lung volumes. No effusion, infiltrate or pneumothorax identified. Some limitation due to body habitus and single view. IMPRESSION: Low lung volumes. No acute cardiopulmonary process. Electronically Signed   By: Jackquline Boxer M.D.   On: 11/22/2023 13:02     Medications: Infusions:  ceFEPime  (MAXIPIME ) IV 1 g (11/23/23 1712)    Scheduled Medications:   stroke: early stages of recovery book   Does not apply Once   atorvastatin   80 mg Oral Daily   Chlorhexidine  Gluconate Cloth  6 each Topical Q0600   clopidogrel   75 mg Oral Daily   famotidine   20 mg Oral BID   Gerhardt's butt cream   Topical BID   heparin   5,000 Units Subcutaneous Q8H   insulin  aspart  0-15 Units Subcutaneous TID WC   insulin  aspart  0-5 Units Subcutaneous QHS   insulin  aspart  8 Units Subcutaneous TID WC   insulin  glargine-yfgn  30 Units Subcutaneous Daily   lactulose   20 g Oral BID   midodrine   10 mg Oral TID WC   sodium chloride  flush  3 mL Intravenous Q12H   thiamine   100 mg Oral Daily    have reviewed scheduled and prn medications.  Physical Exam: General: no distress but lethargic - arousable to voice Heart:RRR, s1s2 nl Lungs:clear b/l, no crackle but rhonchi BL ant, normal WOB Abdomen:soft, Non-tender, non-distended Extremities: Trace peripheral edema present Dialysis Access: Right IJ temporary HD catheter  Neuro:  will grip and move extremities, follows commands and answers simple questions  Manuelita A Paw Karstens 11/24/2023,9:50 AM  LOS: 13 days

## 2023-11-24 NOTE — Progress Notes (Addendum)
 Patient seen in hemodialysis, hypotensive through treatment, despite fluid bolus and IV albumin  -Obtunded, cold and clammy, poorly responsive -Does not appear to be tolerating hemodialysis -Under these circumstances, in the event of a cardiac arrest> I do not think performing CPR and mechanical ventilation is appropriate and strongly feel DNR is indicated, discussed with consultant nephrologist as well who agrees -Change CODE STATUS to DNR, I will be calling the niece again shortly  Sigurd Pac, MD

## 2023-11-24 NOTE — Progress Notes (Addendum)
 Patient with an acute clinical decline and unable to make clinical decisions. Discussed case with Dr. Fairy patient's primary.  Should she have a cardiac arrest CPR would be futile care thus will make patient DNR under 2 physician determination.  Family being contacted as well.   Was also able to speak to POA Niece Tamekia at 12:20pm who is in agreement with DNR and transition to comfort care.   Manuelita Barters MD [facility] Pager [phone]

## 2023-11-24 NOTE — Significant Event (Addendum)
 Rapid Response Event Note   Reason for Call :  Hypotension, lethargy  Initial Focused Assessment:  Pt in bed. Will attenuate to staff when spoken to, otherwise has a flat expression. Oriented x3. EOMI. Grips equal. No distress. Denies pain. Pt diaphoretic, pale. Pt received 0.5mg  IV Dilaudid  at 0932.  Mentation improving following termination of hemodialysis treatment and interventions listed below.   VS: T 97.74F, BP 83/53, HR 69, RR 26, SpO2 100% on 6LNC CBG: 98  Interventions:  -Per HD: 50g Albumin , 400cc IVF returned, 25g IV Dextrose  for CBG 98 (pt CBG usually runs in the 200s) -Scheduled Midodrine  given -Narcan  0.4mg  IV x1  Plan of Care:  -Comfort measures  Event Summary:  MD Notified: Dr. Fairy Call Time: 1148 Arrival Time: 1152 End Time: 1220  Leonor LITTIE Danker, RN

## 2023-11-24 NOTE — Progress Notes (Signed)
 PROGRESS NOTE    [name]  FMW:[id number] DOB: Dec 04, 1955 DOA: 11/11/2023 PCP: Ellaree Hansel [name], MD   68 y.o. F with obesity, sCHF EF 20-25%, recent NSTEMI treated medically, DM and hx bilat BKA, HTN, CKD IIIa baseline 1.2 and hx stroke without residual who presented with SOB.  -Recently admitted for NSTEMI/CHF, underwent LHC that showed severe calcific disease, 80% mid-LAD, 90% prox RCA and 90% ramus intermedius, no PCI due to technically challenging targets, not CABG candidate.  EF 20-25%. -Recovered afterwards and was feeling well, until a day or two before admission, became SOB, sent from facility to hospital.    -In the ER, CXR showed bilateral opacities. Lactate >5.  New atrial fibrillation.  Suspected CHF, pneumonia sepsis, started on antibiotics, diuretics, Cardiology consulted.  -Subsequent hospital course complicated by renal failure.  CVC placed 6/30, initiated dialysis 7/1. -6/30 after temporary HD catheter was placed patient was noted to be nonverbal, MRI obtained noted 5 mm stroke in right frontal lobe, EEG negative - Palliative care following -7/2: Abx stopped, 7days completed -7/3, respiratory distress, stabilized after pulmonary toilet  -7/4, 7/5 palliative care and hospice recommended -7/6 family meeting, patient declined hospice, restarted antibiotics for sepsis, possible aspiration -7/8 hypotensive and obtunded on dialysis, could not tolerate treatment, discussed with niece, changed to DNR and comfort measures   Subjective: - Obtunded, seen on dialysis  Assessment and Plan:  Severe sepsis due to community acquired pneumonia (HCC) Acute respiratory failure with hypoxia Presented with tachycardia, tachypnea, leukocytosis, and respiratory failure with lactic acid greater than 5.  Chest x-ray shows bilateral infiltrates.  Procalcitonin 0.6, trended up to >2. -Completed antibiotics first course on 7/2 - Bronchodilators, pulmonary toilet -7/6 with worsening  cough and leukocytosis, concern for aspiration, restart IV cefepime , blood cultures negative so far, plan was for HD cath removal today after dialysis - Now changed to comfort care today  Acute renal failure superimposed on stage 3a chronic kidney disease (HCC) Baseline creatinine appears to be around 1.2 until her recent admission for CHF and NSTEMI, -now developed oliguric renal failure, likely ischemic ATN in the setting of IV contrast, sepsis, vancomycin , hypotension on 6/27 and diuresis for CHF. -Nephrology consulted, HD catheter placed started hemodialysis 7/1 -Palliative care following -poor candidate for longterm HD, called and updated niece recommended hospice, palliative meeting completed 7/6,  - Further deterioration on dialysis today, could not tolerate treatment, now changed to comfort care  Acute on chronic combined systolic and diastolic CHF (congestive heart failure) [facility]) -  Cardiology consulted have since signed off.  EF 20-25% with advanced CAD not amenable to intervention. - Now complicated by AKI started HD, GDMT limited by hypotension, now ESRD -Dr.Danford d/w advanced heart failure team again on 7/1, given multiorgan failure, they do not recommend any additional interventions at this time  Acute CVA, lacunar infarct Acute metabolic encephalopathy At baseline, patient has no known cognitive impairment.   - On 6/30 noted to be aphasic, less responsive  -MRI brain was obtained that showed small lacunar infarct.  In right frontal lobe  -Consensus from neurology is that infarct likely incidental. -EEG negative for seizures, noted moderate diffuse encephalopathy  -Neurology recommended aspirin  and Plavix  for 3 weeks followed by Plavix  alone  Chronic iron  deficiency anemia Hemoglobin trending down in the setting of renal failure and phlebotomy. No clinical bleeding observed or reported.  Type 2 diabetes mellitus with hyperlipidemia (HCC) Remains somewhat hyperglycemic -  Hold metformin , GLP-1 - On glargine and meal coverage  Essential hypertension Cerebrovascular disease Hypotensive earlier in hospital stay, currently stable on midodrine  - Continue midodrine  - Holding Lasix , Isordil , hydralazine , metoprolol    HLD (hyperlipidemia) - Continue Lipitor   S/P BKA (below knee amputation) bilateral (HCC)   PAF (paroxysmal atrial fibrillation) (HCC) Previous ILR ~10 yrs ago showed no Afib.  On admission this time, transient Afib, now resolved.   Demand ischemia due to type 2 NSTEMI [facility]) Severe calcific coronary artery disease with chronic angina Evaluated by Cardiology.  They suspected type 2 NSTEMI, not ACS.   - Continue Lipitor , Plavix   Class 2 obesity BMI 38, morbid obesity in the setting of comorbid diabetes, hypertension  DVT prophylaxis: hep SQ Code Status: DNR Family Communication: No family at bedside, called and updated niece Tamika Disposition Plan: Anticipate hospital death    Objective: Vitals:   12/16/23 1152 2023/12/16 1155 2023-12-16 1156 12/16/23 1205  BP: (!) 90/52 (!) 85/31 (!) 83/53 (!) 92/58  Pulse: (!) 39 66 69 71  Resp: (!) 22 (!) 21 (!) 26 (!) 28  Temp:   97.9 F (36.6 C)   TempSrc:   Axillary   SpO2: 99% 100% 100% 100%  Weight:      Height:        Intake/Output Summary (Last 24 hours) at 12/16/23 1230 Last data filed at 12-16-2023 1156 Gross per 24 hour  Intake 100 ml  Output 0 ml  Net 100 ml   Filed Weights   11/21/23 0427 11/23/23 0500 12/16/23 0500  Weight: 100.3 kg 102.8 kg 103.2 kg    Examination:  General exam: Obese chronically ill female laying in bed, up to 100 this morning, seen on dialysis HEENT: Neck obese unable to assess JVD, right IJ HD cath CVS: S1-S2,, regular rhythm Lungs: Scattered rhonchi Abdomen: Soft, nontender, bowel sounds present Extremities: Bilateral BKA  Neuro: Somnolent, less responsive and interactive Skin: No rashes Psychiatry: Flat affect    Data Reviewed:    CBC: Recent Labs  Lab 11/19/23 0224 11/20/23 0232 11/22/23 0743 11/23/23 0354 16-Dec-2023 0825  WBC 16.5* 19.8* 22.5* 25.4* 25.3*  HGB 9.0* 9.1* 9.6* 9.4* 8.3*  HCT 29.3* 29.2* 31.4* 30.9* 27.0*  MCV 86.9 85.1 86.5 87.5 87.1  PLT 230 217 191 197 166   Basic Metabolic Panel: Recent Labs  Lab 11/18/23 0226 11/19/23 0224 11/20/23 0232 11/22/23 0347 11/23/23 0354 December 16, 2023 0825  NA 137 138 139 135 137 135  K 4.2 4.3 4.4 3.8 2.5* 2.5*  CL 100 102 105 96* 99 99  CO2 20* 20* 21* 21* 20* 19*  GLUCOSE 106* 133* 210* 333* 207* 194*  BUN 52* 31* 49* 47* 68* 90*  CREATININE 4.53* 3.58* 4.78* 4.81* 5.82* 7.25*  CALCIUM  7.3* 7.8* 7.4* 7.3* 7.2* 6.8*  PHOS 4.9* 4.7* 5.0*  --   --  5.3*   GFR: Estimated Creatinine Clearance: 8.9 mL/min (A) (by C-G formula based on SCr of 7.25 mg/dL (H)). Liver Function Tests: Recent Labs  Lab 11/18/23 0226 11/19/23 0224 11/20/23 0232 2023/12/16 0825  ALBUMIN  2.4* 2.4* 2.5* 2.2*   No results for input(s): LIPASE, AMYLASE in the last 168 hours. No results for input(s): AMMONIA in the last 168 hours. Coagulation Profile: No results for input(s): INR, PROTIME in the last 168 hours. Cardiac Enzymes: No results for input(s): CKTOTAL, CKMB, CKMBINDEX, TROPONINI in the last 168 hours. BNP (last 3 results) No results for input(s): PROBNP in the last 8760 hours. HbA1C: No results for input(s): HGBA1C in the last 72 hours.  CBG: Recent Labs  Lab 11/23/23 1201 11/23/23 1621 11/23/23 2128 11/24/23 0614 11/24/23 1152  GLUCAP 231* 270* 198* 210* 98   Lipid Profile: No results for input(s): CHOL, HDL, LDLCALC, TRIG, CHOLHDL, LDLDIRECT in the last 72 hours.  Thyroid  Function Tests: No results for input(s): TSH, T4TOTAL, FREET4, T3FREE, THYROIDAB in the last 72 hours. Anemia Panel: No results for input(s): VITAMINB12, FOLATE, FERRITIN, TIBC, IRON , RETICCTPCT in the last 72 hours. Urine  analysis:    Component Value Date/Time   COLORURINE AMBER (A) 11/23/2023 0916   APPEARANCEUR CLOUDY (A) 11/23/2023 0916   LABSPEC 1.025 11/23/2023 0916   PHURINE 5.0 11/23/2023 0916   GLUCOSEU NEGATIVE 11/23/2023 0916   HGBUR MODERATE (A) 11/23/2023 0916   BILIRUBINUR NEGATIVE 11/23/2023 0916   KETONESUR 5 (A) 11/23/2023 0916   PROTEINUR 100 (A) 11/23/2023 0916   UROBILINOGEN 0.2 12/31/2014 1928   NITRITE NEGATIVE 11/23/2023 0916   LEUKOCYTESUR LARGE (A) 11/23/2023 0916   Sepsis Labs: @LABRCNTIP (procalcitonin:4,lacticidven:4)  ) Recent Results (from the past 240 hours)  Culture, blood (Routine X 2) w Reflex to ID Panel     Status: None (Preliminary result)   Collection Time: 11/22/23  9:09 AM   Specimen: BLOOD LEFT HAND  Result Value Ref Range Status   Specimen Description BLOOD LEFT HAND  Final   Special Requests   Final    BOTTLES DRAWN AEROBIC ONLY Blood Culture results may not be optimal due to an inadequate volume of blood received in culture bottles   Culture   Final    NO GROWTH 2 DAYS Performed at [facility], [address] [place], [place] [address]    Report Status PENDING  Incomplete  Culture, blood (Routine X 2) w Reflex to ID Panel     Status: None (Preliminary result)   Collection Time: 11/22/23  9:14 AM   Specimen: BLOOD  Result Value Ref Range Status   Specimen Description BLOOD BLOOD RIGHT HAND  Final   Special Requests   Final    AEROBIC BOTTLE ONLY Blood Culture results may not be optimal due to an inadequate volume of blood received in culture bottles   Culture   Final    NO GROWTH 2 DAYS Performed at [facility], [address] [place], [place] [address]    Report Status PENDING  Incomplete     Radiology Studies: No results found.     Scheduled Meds:   Continuous Infusions:      LOS: 13 days    Time spent:    Sigurd Pac, MD [facility]   11/24/2023, 12:30 PM

## 2023-11-24 NOTE — Progress Notes (Signed)
 Case discussed with nephrologist. Advised to hold clip for now due to pt's current status. Will assist as needed.   Randine Mungo Renal Navigator [phone]

## 2023-11-24 NOTE — Progress Notes (Addendum)
 Palliative Medicine Inpatient Follow Up Note HPI: 68 y.o. F with obesity, sCHF EF 20-25%, recent NSTEMI treated medically, DM and hx bilat BKA, HTN, CKD IIIa baseline 1.2 and hx stroke without residual who presented with SOB. The Palliative care team has been asked to support additional goals of care conversations.   Today's Discussion 11/24/2023  *Please note that this is a verbal dictation therefore any spelling or grammatical errors are due to the Dragon Medical One system interpretation.  Chart reviewed inclusive of vital signs, progress notes, laboratory results, and diagnostic images.   I met with Thedora this morning, she appears more somnolent though is arouasable and is in no appreciable distress. She herself denies pain, shortness of breath, or nausea. She shares improvement in her feelings of congestion.   We reviewed the plan for Mishael to receive dialysis in the sitting position. Emphasis placed on how telling it would be if patient was unable to tolerate this. Also discussed patients tenuous blood pressures and how dialysis can cause shifts I this. Rexanne was not able when asked to repeat back to me the context of our conversations so as of today it is not clear if she understands her current condition(s).   I spoke with patients niece and brought to light the concerns of patients current clinical condition as well as short and long term worries in the setting of receiving iHD. Luane shares understanding of how fragile her circumstances are she while she does not want her to suffer, her aunt Apolinar wants to continue present measures. We discussed potential outcomes. Luane has seen a shift in [place] and queries if she may be transitioning to a more declined state. She plans to see her later this afternoon though she does not Renai is not the same as of yesterday. We reviewed tx of her infectious sources which could be contributing.   _____________________ Addendum:  Alerted by the primary and nephrology services that Brehanna became more obtunded and hypotensive during dialysis treatment requiring a RR to be called.   I called patients niece, Luane - determination for DNAR/DNI and no escalation of care was made.  Niece has determined comfort measures of the best step for the patient to promote comfort and alleviate suffering. She has spoken to Dr. Norine and Dr. Fairy regarding this.  Comfort care order instituted.  Plan to meet at [facility].  Questions and concerns addressed/Palliative Support Provided.   Add time: 30 ____________________________________  Addendum:  Additional conversations were held with patients niece, Alissa today. We discussed patient current health state and the decisions which had already been made.  There are some complications family dynamics at play though Temika notes that she feels the decisions she has made for her aunt thus far are best. We reviewed that every effort to improve Launi's current situation has been pursued but sadly her body is not able to sustain with such efforts.   Plan to continue comfort focused care allowing for a peaceful death.  We have decided to hold off on placement to an IP hospice unit at this time as it would cause increased family turmoil.   Add time: 30  Objective Assessment: Vital Signs Vitals:   11/24/23 0952 11/24/23 1005  BP: (!) 89/28 97/61  Pulse: 75 79  Resp: (!) 24 (!) 33  Temp:    SpO2: 100% 100%    Intake/Output Summary (Last 24 hours) at 11/24/2023 1041 Last data filed at 11/24/2023 0600 Gross per 24 hour  Intake 100 ml  Output --  Net 100 ml   Last Weight  Most recent update: 11/24/2023  7:48 AM    Weight  103.2 kg (227 lb 8.2 oz)            Gen:  Elderly AA F chronically ill appearing HEENT: moist mucous membranes CV: Regular rate and rhythm  PULM:  On 6LPM [place], breathing is even and nonlabored ABD: soft/nontender   EXT: (+) edema BL AKAs Neuro: Aware of person and place - needs clarity on situation  SUMMARY OF RECOMMENDATIONS   DNAR/DNI  Comfort Care  Medications per [facility]  Unrestricted visitation  Ongoing PMT support ______________________________________________________________________________________ Rosaline Becton [place] Palliative Medicine Team Team Cell Phone: [phone] Please utilize secure chat with additional questions, if there is no response within 30 minutes please call the above phone number  Time: 45 Billing based on MDM: High  Palliative Medicine Team providers are available by phone from 7am to 7pm daily and can be reached through the team cell phone.  Should this patient require assistance outside of these hours, please call the patient's attending physician.

## 2023-11-24 NOTE — Progress Notes (Signed)
 R internal jugular HD catheter removed. Site unremarkable. Manual pressure held x25 minutes. Pt tolerated without dificulty. Dressing to remain intact x24hours.

## 2023-11-25 DIAGNOSIS — A419 Sepsis, unspecified organism: Secondary | ICD-10-CM | POA: Diagnosis not present

## 2023-11-25 DIAGNOSIS — Z515 Encounter for palliative care: Secondary | ICD-10-CM | POA: Diagnosis not present

## 2023-11-25 DIAGNOSIS — R6520 Severe sepsis without septic shock: Secondary | ICD-10-CM | POA: Diagnosis not present

## 2023-11-25 DIAGNOSIS — Z7189 Other specified counseling: Secondary | ICD-10-CM | POA: Diagnosis not present

## 2023-11-25 MED ORDER — GERHARDT'S BUTT CREAM
TOPICAL_CREAM | Freq: Two times a day (BID) | CUTANEOUS | Status: DC
  Filled 2023-11-25: qty 60

## 2023-11-25 NOTE — Progress Notes (Signed)
 PROGRESS NOTE  [name]  FMW:[id number] DOB: January 04, 1956 DOA: 11/11/2023 PCP: Ellaree Hansel [name], MD   Brief Narrative: Patient is a 68 y.o. F with obesity, sCHF EF 20-25%, recent NSTEMI treated medically, DM and hx bilat BKA, HTN, CKD IIIa baseline 1.2 and hx stroke without residual who presented with SOB.She was recently admitted for NSTEMI/CHF, underwent LHC that showed severe calcific disease, 80% mid-LAD, 90% prox RCA and 90% ramus intermedius, no PCI due to technically challenging targets, not CABG candidate with EF 20-25%.  On PD since x-ray showed bilateral opacities.  Lactate was more than 5.  Patient was noticed to be in A-fib.  Patient was started on antibiotics for pneumonia/sepsis.  Cardiology consulted.  Hospital course completed by renal failure, nephrology consulted and started on dialysis on 7/1.  Patient subsequently became nonverbal, MRI showed 5 mm stroke in the right frontal lobe.  Palliative care consulted due to poor prognosis.  After discussion with family, she has been transitioned to comfort care.  Anticipated hospital death,no plan for residential hospice  Assessment & Plan:  Principal Problem:   Severe sepsis due to community acquired pneumonia [facility]) Active Problems:   Acute renal failure superimposed on stage 3a chronic kidney disease (HCC)   Chronic iron  deficiency anemia   Type 2 diabetes mellitus with hyperlipidemia (HCC)   Essential hypertension   HLD (hyperlipidemia)   S/P BKA (below knee amputation) bilateral (HCC)   Class 2 obesity   Coronary artery disease involving native coronary artery of native heart without angina pectoris   Cerebrovascular disease   Demand ischemia due to type 2 NSTEMI (HCC)   Acute on chronic combined systolic and diastolic CHF (congestive heart failure) (HCC)   PAF (paroxysmal atrial fibrillation) (HCC)   Lacunar infarct, acute (HCC)   Goals of care: Currently on full comfort care.  Anticipate hospital death .   Trying to avoid residential hospice due to family normal.  Palliative care following  Severe sepsis secondary to multifocal pneumonia: Was on antibiotics.  Currently on full comfort care  AKI on CKD stage IIIa: Kidney function progressively declined.  Started on dialysis.  Nephrology was following.  Currently on full comfort care.  Acute on chronic combined systolic/diastolic CHF: Last EF of 20 to 25% with advanced CAD not amenable to intervention.  Currently on comfort care  Acute ischemic stroke/acute metabolic encephalopathy: Developed  stroke during this hospitalization.  EEG negative for seizure.  Other chronic medical problems: Hypertension, hyperlipidemia, status post bilateral BKA, paroxysmal A-fib, obesity   Nutrition Problem: Inadequate oral intake Etiology: acute illness, poor appetite (SOB, hypotension, reliance on [place] O2)    DVT prophylaxis:None     Code Status: Do not attempt resuscitation (DNR) - Comfort care  Family Communication: None at bedside  Patient status:Inpatient  Patient is from :home  Anticipated discharge to: On full comfort care, anticipated hospital death     Consultants: Cardiology, palliative care, nephrology  Procedures: Dialysis  Antimicrobials:  Anti-infectives (From admission, onward)    Start     Dose/Rate Route Frequency Ordered Stop   11/22/23 1215  ceFEPIme  (MAXIPIME ) 1 g in sodium chloride  0.9 % 100 mL IVPB  Status:  Discontinued        1 g 200 mL/hr over 30 Minutes Intravenous Every 24 hours 11/22/23 1115 11/24/23 1229   11/14/23 0300  ceFEPIme  (MAXIPIME ) 2 g in sodium chloride  0.9 % 100 mL IVPB  Status:  Discontinued        2 g 200  mL/hr over 30 Minutes Intravenous Every 24 hours 11/13/23 0707 11/18/23 1416   11/13/23 1700  vancomycin  (VANCOREADY) IVPB 1750 mg/350 mL  Status:  Discontinued        1,750 mg 175 mL/hr over 120 Minutes Intravenous Every 48 hours 11/11/23 1918 11/13/23 0650   11/12/23 0300  ceFEPIme  (MAXIPIME ) 2 g  in sodium chloride  0.9 % 100 mL IVPB  Status:  Discontinued        2 g 200 mL/hr over 30 Minutes Intravenous Every 12 hours 11/11/23 1918 11/13/23 0707   11/11/23 1845  metroNIDAZOLE  (FLAGYL ) IVPB 500 mg  Status:  Discontinued        500 mg 100 mL/hr over 60 Minutes Intravenous Every 12 hours 11/11/23 1844 11/12/23 0814   11/11/23 1430  ceFEPIme  (MAXIPIME ) 2 g in sodium chloride  0.9 % 100 mL IVPB        2 g 200 mL/hr over 30 Minutes Intravenous  Once 11/11/23 1418 11/11/23 1552   11/11/23 1430  vancomycin  (VANCOREADY) IVPB 2000 mg/400 mL        2,000 mg 200 mL/hr over 120 Minutes Intravenous  Once 11/11/23 1427 11/11/23 1935       Subjective: Patient seen and examined at the bedside today.  She has been on for comfort care.  Looks overall comfortable, not seen or any current distress, eyes closed  Objective: Vitals:   11/24/23 1220 11/24/23 1250 11/24/23 1701 11/25/23 0415  BP: (!) 95/57 97/64 (!) 84/28 (!) 86/34  Pulse: 73 70 68 64  Resp: (!) 27  16 17   Temp:  97.7 F (36.5 C) 98.6 F (37 C) 98.6 F (37 C)  TempSrc:  Oral Oral   SpO2: 100% 100% 100% 100%  Weight:      Height:       No intake or output data in the 24 hours ending 11/25/23 1132 Filed Weights   11/21/23 0427 11/23/23 0500 11/24/23 0500  Weight: 100.3 kg 102.8 kg 103.2 kg    Examination:  General exam: Overall comfortable, not in distress, on full comfort care, obese HEENT: Eyes closed Respiratory system:  no wheezes or crackles  Cardiovascular system: S1 & S2 heard, RRR.  Gastrointestinal system: Abdomen is nondistended, soft and nontender. Central nervous system: Not alert or oriented Extremities: Bilateral BKA   Data Reviewed: I have personally reviewed following labs and imaging studies  CBC: Recent Labs  Lab 11/19/23 0224 11/20/23 0232 11/22/23 0743 11/23/23 0354 11/24/23 0825  WBC 16.5* 19.8* 22.5* 25.4* 25.3*  HGB 9.0* 9.1* 9.6* 9.4* 8.3*  HCT 29.3* 29.2* 31.4* 30.9* 27.0*  MCV 86.9  85.1 86.5 87.5 87.1  PLT 230 217 191 197 166   Basic Metabolic Panel: Recent Labs  Lab 11/19/23 0224 11/20/23 0232 11/22/23 0347 11/23/23 0354 11/24/23 0825  NA 138 139 135 137 135  K 4.3 4.4 3.8 2.5* 2.5*  CL 102 105 96* 99 99  CO2 20* 21* 21* 20* 19*  GLUCOSE 133* 210* 333* 207* 194*  BUN 31* 49* 47* 68* 90*  CREATININE 3.58* 4.78* 4.81* 5.82* 7.25*  CALCIUM  7.8* 7.4* 7.3* 7.2* 6.8*  PHOS 4.7* 5.0*  --   --  5.3*     Recent Results (from the past 240 hours)  Culture, blood (Routine X 2) w Reflex to ID Panel     Status: None (Preliminary result)   Collection Time: 11/22/23  9:09 AM   Specimen: BLOOD LEFT HAND  Result Value Ref Range Status   Specimen Description BLOOD  LEFT HAND  Final   Special Requests   Final    BOTTLES DRAWN AEROBIC ONLY Blood Culture results may not be optimal due to an inadequate volume of blood received in culture bottles   Culture   Final    NO GROWTH 3 DAYS Performed at [facility], [address] [place], [place] [address]    Report Status PENDING  Incomplete  Culture, blood (Routine X 2) w Reflex to ID Panel     Status: None (Preliminary result)   Collection Time: 11/22/23  9:14 AM   Specimen: BLOOD  Result Value Ref Range Status   Specimen Description BLOOD BLOOD RIGHT HAND  Final   Special Requests   Final    AEROBIC BOTTLE ONLY Blood Culture results may not be optimal due to an inadequate volume of blood received in culture bottles   Culture   Final    NO GROWTH 3 DAYS Performed at [facility], [address] [place], [place] [address]    Report Status PENDING  Incomplete     Radiology Studies: No results found.  Scheduled Meds:  Gerhardt's butt cream   Topical BID   Continuous Infusions:   LOS: 14 days   Ivonne Mustache, MD [facility] P7/01/2024, 11:32 AM

## 2023-11-25 NOTE — Plan of Care (Signed)
  Problem: Coping: Goal: Ability to adjust to condition or change in health will improve Outcome: Progressing   Problem: Fluid Volume: Goal: Ability to maintain a balanced intake and output will improve Outcome: Progressing   Problem: Health Behavior/Discharge Planning: Goal: Ability to identify and utilize available resources and services will improve Outcome: Progressing   Problem: Metabolic: Goal: Ability to maintain appropriate glucose levels will improve Outcome: Progressing   Problem: Nutritional: Goal: Maintenance of adequate nutrition will improve Outcome: Progressing   

## 2023-11-25 NOTE — Progress Notes (Signed)
 Nephrology follow up note:  per note yesterday didn't tolerate dialysis and had clinical decline.  Now comfort care.  I won't plan to see patient but if I can help in any way don't hesitate to reach out.   Manuelita Barters MD [facility] Pager [phone]

## 2023-11-25 NOTE — Plan of Care (Signed)
  Problem: Education: Goal: Ability to describe self-care measures that may prevent or decrease complications (Diabetes Survival Skills Education) will improve Outcome: Progressing   Problem: Skin Integrity: Goal: Risk for impaired skin integrity will decrease Outcome: Progressing   Problem: Activity: Goal: Risk for activity intolerance will decrease Outcome: Progressing   Problem: Nutrition: Goal: Adequate nutrition will be maintained Outcome: Progressing   

## 2023-11-25 NOTE — Progress Notes (Signed)
 Sent provider a message regarding pt having diarrhea. She's had 4 episodes today and it's irritating her buttocks area. Provider order a flexi seal. Secretary ordered the system. I will make day shift aware as well.

## 2023-11-25 NOTE — Consult Note (Signed)
 WOC Nurse Consult Note: Reason for Consult:To evaluate the buttoks and sacral area. There is currently skin breakdown, redness, bleeding and it's causing the Pt pain.  Wound type: moisture associated skin damage (MASD) in the setting of frequent incontinence/diarrhea Pressure Injury POA: NA Measurement: redness and irritation to bilateral buttocks Wound bed: red, moist Drainage (amount, consistency, odor) scant Periwound: erythema Dressing procedure/placement/frequency:  Care discussed with bedside nurse who reports plan for indwelling fecal management system to be placed today. Post placement:  Apply gerhardt's butt cream to bilateral buttocks Apply protective sacral foam dressing to the sacrum/coccygeal area.  Lift dressing each shift to assess site.  Change Q 3 days and PRN soiling. Utilize frequent turning to help offload pressure from buttocks.  WOC team will not follow at this time.  Please re-consult if new needs arise.  Thank you,  Doyal Polite, RN, MSN, [facility] WOC Team

## 2023-11-25 NOTE — Progress Notes (Signed)
 This chaplain is present for F/U spiritual care in the setting of the Pt. transition to comfort care. The Pt. is resting comfortably, acknowledges my presence with a nod, and shares she is comfortable. Remembering the Pt. Previous request for prayer, the chaplain honored the silence in the room and exited after prayer. Family is not at the bedside.  Chaplain Leeroy Hummer [phone]

## 2023-11-25 NOTE — Progress Notes (Signed)
   Palliative Medicine Inpatient Follow Up Note HPI: 68 y.o. F with obesity, sCHF EF 20-25%, recent NSTEMI treated medically, DM and hx bilat BKA, HTN, CKD IIIa baseline 1.2 and hx stroke without residual who presented with SOB. The Palliative care team has been asked to support additional goals of care conversations.   Today's Discussion 11/25/2023  *Please note that this is a verbal dictation therefore any spelling or grammatical errors are due to the Dragon Medical One system interpretation.  Chart reviewed inclusive of vital signs, progress notes, laboratory results, and diagnostic images.   I met with Kaysen this afternoon at bedside in the company of her sister, Gustav.   Kathia appears to be comfortable and in no active distress at this time. She is   We discussed patients current state. Gustav is relieved that as of presently, she appears comfortable. Gustav shares the complicated social dynamics among the family and how her sister, Apolinar has never been accepting of death. She shares that this is not new and has been an ongoing battle as she did not accept the death of either parent and has difficulty with coping which results in verbal aggression.   We discussed the path moving forward which is to maintain dignity, comfort, and peace during the final phase of Dawana's life.  I called and spoke with Mariafernanda's, niece, Alissa who shares if it is possible for Amanda to remain in the hospital for her end of life care that would be preferred but she understands if Sheva is stable she would need to transition to a hospice home. If this is the case she would elect [facility].   Plan to continue present comfort care measures in house at this time.   Questions and concerns addressed/Palliative Support Provided.   Objective Assessment: Vital Signs Vitals:   11/24/23 1701 11/25/23 0415  BP: (!) 84/28 (!) 86/34  Pulse: 68 64  Resp: 16 17  Temp: 98.6 F (37 C) 98.6 F (37 C)   SpO2: 100% 100%   No intake or output data in the 24 hours ending 11/25/23 1343  Last Weight  Most recent update: 11/24/2023  7:48 AM    Weight  103.2 kg (227 lb 8.2 oz)            Gen:  Elderly AA F chronically ill appearing HEENT: moist mucous membranes CV: Regular rate and rhythm  PULM:  On 6LPM [place], breathing is even and nonlabored ABD: soft/nontender  EXT: (+) edema BL AKAs Neuro: Aware of person and place - needs clarity on situation  SUMMARY OF RECOMMENDATIONS   DNAR/DNI  Comfort Care  Medications per [facility]  Unrestricted visitation  Ongoing PMT support ______________________________________________________________________________________ Rosaline Becton [place] Palliative Medicine Team Team Cell Phone: [phone] Please utilize secure chat with additional questions, if there is no response within 30 minutes please call the above phone number  Time: 48 Billing based on MDM: High  Palliative Medicine Team providers are available by phone from 7am to 7pm daily and can be reached through the team cell phone.  Should this patient require assistance outside of these hours, please call the patient's attending physician.

## 2023-11-26 DIAGNOSIS — J189 Pneumonia, unspecified organism: Secondary | ICD-10-CM | POA: Diagnosis not present

## 2023-11-26 DIAGNOSIS — N171 Acute kidney failure with acute cortical necrosis: Secondary | ICD-10-CM | POA: Diagnosis not present

## 2023-11-26 DIAGNOSIS — A419 Sepsis, unspecified organism: Secondary | ICD-10-CM | POA: Diagnosis not present

## 2023-11-26 DIAGNOSIS — Z515 Encounter for palliative care: Secondary | ICD-10-CM | POA: Diagnosis not present

## 2023-11-26 DIAGNOSIS — I5043 Acute on chronic combined systolic (congestive) and diastolic (congestive) heart failure: Secondary | ICD-10-CM | POA: Diagnosis not present

## 2023-11-26 DIAGNOSIS — R6520 Severe sepsis without septic shock: Secondary | ICD-10-CM | POA: Diagnosis not present

## 2023-11-26 MED ORDER — GLYCOPYRROLATE 1 MG PO TABS: 1.0000 mg | ORAL_TABLET | ORAL | Status: DC | PRN

## 2023-11-26 MED ORDER — GLYCOPYRROLATE 0.2 MG/ML IJ SOLN
0.6000 mg | INTRAMUSCULAR | Status: DC | PRN
  Administered 2023-11-26 – 2023-11-27 (×3): 0.6 mg via INTRAVENOUS
  Filled 2023-11-26 (×3): qty 3

## 2023-11-26 MED ORDER — GLYCOPYRROLATE 0.2 MG/ML IJ SOLN
0.2000 mg | INTRAMUSCULAR | Status: DC | PRN
Start: 2023-11-26 — End: 2023-11-27

## 2023-11-26 MED ORDER — MORPHINE SULFATE (PF) 2 MG/ML IV SOLN
2.0000 mg | INTRAVENOUS | Status: DC | PRN
  Administered 2023-11-26: 2 mg via INTRAVENOUS
  Filled 2023-11-26: qty 1

## 2023-11-26 MED ORDER — HYDROMORPHONE HCL 1 MG/ML IJ SOLN
1.0000 mg | INTRAMUSCULAR | Status: DC
  Administered 2023-11-26 – 2023-11-27 (×4): 1 mg via INTRAVENOUS
  Filled 2023-11-26 (×4): qty 1

## 2023-11-26 NOTE — Progress Notes (Signed)
 PROGRESS NOTE  [name]  FMW:[id number] DOB: [birth year] DOA: 11/11/2023 PCP: Ellaree Hansel [name], MD   Brief Narrative: Patient is a 68 y.o. F with obesity, sCHF EF 20-25%, recent NSTEMI treated medically, DM and hx bilat BKA, HTN, CKD IIIa baseline 1.2 and hx stroke without residual who presented with SOB.She was recently admitted for NSTEMI/CHF, underwent LHC that showed severe calcific disease, 80% mid-LAD, 90% prox RCA and 90% ramus intermedius, no PCI due to technically challenging targets, not CABG candidate with EF 20-25%.  On PD since x-ray showed bilateral opacities.  Lactate was more than 5.  Patient was noticed to be in A-fib.  Patient was started on antibiotics for pneumonia/sepsis.  Cardiology consulted.  Hospital course completed by renal failure, nephrology consulted and started on dialysis on 7/1.  Patient subsequently became nonverbal, MRI showed 5 mm stroke in the right frontal lobe.  Palliative care consulted due to poor prognosis.  After discussion with family, she has been transitioned to comfort care.  Anticipated hospital death,no plan for residential hospice  Assessment & Plan:  Principal Problem:   Severe sepsis due to community acquired pneumonia [facility]) Active Problems:   Acute renal failure superimposed on stage 3a chronic kidney disease (HCC)   Chronic iron  deficiency anemia   Type 2 diabetes mellitus with hyperlipidemia (HCC)   Essential hypertension   HLD (hyperlipidemia)   S/P BKA (below knee amputation) bilateral (HCC)   Class 2 obesity   Coronary artery disease involving native coronary artery of native heart without angina pectoris   Cerebrovascular disease   Demand ischemia due to type 2 NSTEMI (HCC)   Acute on chronic combined systolic and diastolic CHF (congestive heart failure) (HCC)   PAF (paroxysmal atrial fibrillation) (HCC)   Lacunar infarct, acute (HCC)   Goals of care: Currently on full comfort care.  Anticipate hospital death .   Trying to avoid residential hospice due to family turmoil.  Palliative care following  Severe sepsis secondary to multifocal pneumonia: Was on antibiotics.  Currently on full comfort care  AKI on CKD stage IIIa: Kidney function progressively declined.  Started on dialysis.  Nephrology was following.  Currently on full comfort care.  Acute on chronic combined systolic/diastolic CHF: Last EF of 20 to 25% with advanced CAD not amenable to intervention.  Currently on comfort care  Acute ischemic stroke/acute metabolic encephalopathy: Developed  stroke during this hospitalization.  EEG negative for seizure.  Other chronic medical problems: Hypertension, hyperlipidemia, status post bilateral BKA, paroxysmal A-fib, obesity   Nutrition Problem: Inadequate oral intake Etiology: acute illness, poor appetite (SOB, hypotension, reliance on [place] O2)    DVT prophylaxis:None     Code Status: Do not attempt resuscitation (DNR) - Comfort care  Family Communication: None at bedside  Patient status:Inpatient  Patient is from :home  Anticipated discharge to: On full comfort care, anticipated hospital death     Consultants: Cardiology, palliative care, nephrology  Procedures: Dialysis  Antimicrobials:  Anti-infectives (From admission, onward)    Start     Dose/Rate Route Frequency Ordered Stop   11/22/23 1215  ceFEPIme  (MAXIPIME ) 1 g in sodium chloride  0.9 % 100 mL IVPB  Status:  Discontinued        1 g 200 mL/hr over 30 Minutes Intravenous Every 24 hours 11/22/23 1115 11/24/23 1229   11/14/23 0300  ceFEPIme  (MAXIPIME ) 2 g in sodium chloride  0.9 % 100 mL IVPB  Status:  Discontinued        2 g 200  mL/hr over 30 Minutes Intravenous Every 24 hours 11/13/23 0707 11/18/23 1416   11/13/23 1700  vancomycin  (VANCOREADY) IVPB 1750 mg/350 mL  Status:  Discontinued        1,750 mg 175 mL/hr over 120 Minutes Intravenous Every 48 hours 11/11/23 1918 11/13/23 0650   11/12/23 0300  ceFEPIme  (MAXIPIME ) 2  g in sodium chloride  0.9 % 100 mL IVPB  Status:  Discontinued        2 g 200 mL/hr over 30 Minutes Intravenous Every 12 hours 11/11/23 1918 11/13/23 0707   11/11/23 1845  metroNIDAZOLE  (FLAGYL ) IVPB 500 mg  Status:  Discontinued        500 mg 100 mL/hr over 60 Minutes Intravenous Every 12 hours 11/11/23 1844 11/12/23 0814   11/11/23 1430  ceFEPIme  (MAXIPIME ) 2 g in sodium chloride  0.9 % 100 mL IVPB        2 g 200 mL/hr over 30 Minutes Intravenous  Once 11/11/23 1418 11/11/23 1552   11/11/23 1430  vancomycin  (VANCOREADY) IVPB 2000 mg/400 mL        2,000 mg 200 mL/hr over 120 Minutes Intravenous  Once 11/11/23 1427 11/11/23 1935       Subjective: Patient seen and examined at the bedside today.  She was lying in bed.  Eyes closed, comatose.  Cheyne-Stokes breathing pattern observed.  Might be close to the end of life  Objective: Vitals:   11/24/23 1250 11/24/23 1701 11/25/23 0415 11/26/23 0500  BP: 97/64 (!) 84/28 (!) 86/34   Pulse: 70 68 64   Resp:  16 17 16   Temp: 97.7 F (36.5 C) 98.6 F (37 C) 98.6 F (37 C) 98 F (36.7 C)  TempSrc: Oral Oral    SpO2: 100% 100% 100% 98%  Weight:      Height:        Intake/Output Summary (Last 24 hours) at 11/26/2023 1158 Last data filed at 11/26/2023 0900 Gross per 24 hour  Intake 0 ml  Output --  Net 0 ml   Filed Weights   11/21/23 0427 11/23/23 0500 11/24/23 0500  Weight: 100.3 kg 102.8 kg 103.2 kg    Examination:  General exam: On full comfort care, comatose, does not look to be in distress.  Bilateral BKA. Cheyne-Stokes breathing pattern  Data Reviewed: I have personally reviewed following labs and imaging studies  CBC: Recent Labs  Lab 11/20/23 0232 11/22/23 0743 11/23/23 0354 11/24/23 0825  WBC 19.8* 22.5* 25.4* 25.3*  HGB 9.1* 9.6* 9.4* 8.3*  HCT 29.2* 31.4* 30.9* 27.0*  MCV 85.1 86.5 87.5 87.1  PLT 217 191 197 166   Basic Metabolic Panel: Recent Labs  Lab 11/20/23 0232 11/22/23 0347 11/23/23 0354  11/24/23 0825  NA 139 135 137 135  K 4.4 3.8 2.5* 2.5*  CL 105 96* 99 99  CO2 21* 21* 20* 19*  GLUCOSE 210* 333* 207* 194*  BUN 49* 47* 68* 90*  CREATININE 4.78* 4.81* 5.82* 7.25*  CALCIUM  7.4* 7.3* 7.2* 6.8*  PHOS 5.0*  --   --  5.3*     Recent Results (from the past 240 hours)  Culture, blood (Routine X 2) w Reflex to ID Panel     Status: None (Preliminary result)   Collection Time: 11/22/23  9:09 AM   Specimen: BLOOD LEFT HAND  Result Value Ref Range Status   Specimen Description BLOOD LEFT HAND  Final   Special Requests   Final    BOTTLES DRAWN AEROBIC ONLY Blood Culture results may not  be optimal due to an inadequate volume of blood received in culture bottles   Culture   Final    NO GROWTH 4 DAYS Performed at [facility], [address], [place], [place] [address]    Report Status PENDING  Incomplete  Culture, blood (Routine X 2) w Reflex to ID Panel     Status: None (Preliminary result)   Collection Time: 11/22/23  9:14 AM   Specimen: BLOOD  Result Value Ref Range Status   Specimen Description BLOOD BLOOD RIGHT HAND  Final   Special Requests   Final    AEROBIC BOTTLE ONLY Blood Culture results may not be optimal due to an inadequate volume of blood received in culture bottles   Culture   Final    NO GROWTH 4 DAYS Performed at [facility], [address], [place], [place] [address]    Report Status PENDING  Incomplete     Radiology Studies: No results found.  Scheduled Meds:  Gerhardt's butt cream   Topical BID   Continuous Infusions:   LOS: 15 days   Ivonne Mustache, MD [facility] P7/02/2024, 11:58 AM

## 2023-11-26 NOTE — Progress Notes (Signed)
 This chaplain is present for F/U EOL spiritual care. Family is not at the bedside.   The Pt. did not respond to her name on this visit. Cheyne-Stokes breathing observed. Prayer  was shared with the Pt.   This chaplain is available for F/U spiritual care as needed.  Chaplain Leeroy Hummer [phone]

## 2023-11-26 NOTE — Plan of Care (Addendum)
 Problem: Education: Goal: Ability to describe self-care measures that may prevent or decrease complications (Diabetes Survival Skills Education) will improve Outcome: Not Progressing Goal: Individualized Educational Video(s) Outcome: Not Progressing   Problem: Coping: Goal: Ability to adjust to condition or change in health will improve Outcome: Not Progressing   Problem: Fluid Volume: Goal: Ability to maintain a balanced intake and output will improve Outcome: Not Progressing   Problem: Health Behavior/Discharge Planning: Goal: Ability to identify and utilize available resources and services will improve Outcome: Not Progressing Goal: Ability to manage health-related needs will improve Outcome: Not Progressing   Problem: Metabolic: Goal: Ability to maintain appropriate glucose levels will improve Outcome: Not Progressing   Problem: Nutritional: Goal: Maintenance of adequate nutrition will improve Outcome: Not Progressing Goal: Progress toward achieving an optimal weight will improve Outcome: Not Progressing   Problem: Skin Integrity: Goal: Risk for impaired skin integrity will decrease Outcome: Not Progressing   Problem: Tissue Perfusion: Goal: Adequacy of tissue perfusion will improve Outcome: Not Progressing   Problem: Education: Goal: Knowledge of General Education information will improve Description: Including pain rating scale, medication(s)/side effects and non-pharmacologic comfort measures Outcome: Not Progressing   Problem: Health Behavior/Discharge Planning: Goal: Ability to manage health-related needs will improve Outcome: Not Progressing   Problem: Clinical Measurements: Goal: Ability to maintain clinical measurements within normal limits will improve Outcome: Not Progressing Goal: Will remain free from infection Outcome: Not Progressing Goal: Diagnostic test results will improve Outcome: Not Progressing Goal: Respiratory complications will  improve Outcome: Not Progressing Goal: Cardiovascular complication will be avoided Outcome: Not Progressing   Problem: Nutrition: Goal: Adequate nutrition will be maintained Outcome: Not Progressing   Problem: Coping: Goal: Level of anxiety will decrease Outcome: Not Progressing   Problem: Pain Managment: Goal: General experience of comfort will improve and/or be controlled Outcome: Not Progressing   Problem: Safety: Goal: Ability to remain free from injury will improve Outcome: Not Progressing   Problem: Elimination: Goal: Will not experience complications related to bowel motility Outcome: Not Progressing Goal: Will not experience complications related to urinary retention Outcome: Not Progressing   Problem: Education: Goal: Ability to demonstrate management of disease process will improve Outcome: Not Progressing Goal: Ability to verbalize understanding of medication therapies will improve Outcome: Not Progressing Goal: Individualized Educational Video(s) Outcome: Not Progressing   Problem: Education: Goal: Knowledge of disease or condition will improve Outcome: Not Progressing Goal: Knowledge of secondary prevention will improve (MUST DOCUMENT ALL) Outcome: Not Progressing Goal: Knowledge of patient specific risk factors will improve (DELETE if not current risk factor) Outcome: Not Progressing   Problem: Coping: Goal: Will verbalize positive feelings about self Outcome: Not Progressing Goal: Will identify appropriate support needs Outcome: Not Progressing   Problem: Health Behavior/Discharge Planning: Goal: Ability to manage health-related needs will improve Outcome: Not Progressing Goal: Goals will be collaboratively established with patient/family Outcome: Not Progressing   Problem: Self-Care: Goal: Ability to participate in self-care as condition permits will improve Outcome: Not Progressing Goal: Verbalization of feelings and concerns over difficulty  with self-care will improve Outcome: Not Progressing Goal: Ability to communicate needs accurately will improve Outcome: Not Progressing   Problem: Nutrition: Goal: Risk of aspiration will decrease Outcome: Not Progressing Goal: Dietary intake will improve Outcome: Not Progressing   Problem: Education: Goal: Knowledge of disease or condition will improve Outcome: Not Progressing Goal: Knowledge of secondary prevention will improve (MUST DOCUMENT ALL) Outcome: Not Progressing Goal: Knowledge of patient specific risk factors will improve (DELETE if not current  risk factor) Outcome: Not Progressing  Pt is end of life.

## 2023-11-26 NOTE — Progress Notes (Signed)
 Palliative Medicine Progress Note   Patient Name: [name]       Date: 11/26/2023 DOB: [birth year]  Age: 68 y.o. MRN#: [id number] Attending Physician: Jillian Buttery, MD Primary Care Physician: Ellaree Rams, Gerardo E, MD Admit Date: 11/11/2023   HPI/Patient Profile: 68 y.o. F with obesity, sCHF EF 20-25%, recent NSTEMI treated medically, DM and hx bilat BKA, HTN, CKD IIIa baseline 1.2 and hx stroke without residual who presented with SOB. The Palliative care team has been asked to support additional goals of care conversations.   Subjective: Chart reviewed. Notified by RN that patient is uncomfortable, and current prn medications are not effective.   Patient assessed at bedside. She appears uncomfortable, with increased work of breathing. She is not responsive to voice. Cheyne-Stokes breathing noted. Also note excessive respiratory secretions.   Family present at bedside - aunt/Lena and sister/Wanda. I introduced myself as an NP with Palliative Medicine. Explained that patient seems to be transitioning, and offered education on the natural trajectory at end-of-life.  Apolinar expresses she is upset, and states she was not aware patient was at end-of-life. Emotional support provided.   I later spoke with niece/HCPOA Tamekia by phone and provided updates as per above. Luane states that Apolinar was made aware of patient's condition, but continues to struggle to process and accept the situation.    Objective:  Physical Exam Vitals reviewed.  Constitutional:      Appearance: She is ill-appearing.  Pulmonary:     Comments: Cheyne-Stokes breathing Neurological:     Mental Status: She is unresponsive.              Palliative Medicine Assessment & Plan   Assessment: Principal  Problem:   Severe sepsis due to community acquired pneumonia [facility]) Active Problems:   HLD (hyperlipidemia)   Class 2 obesity   Essential hypertension   Coronary artery disease involving native coronary artery of native heart without angina pectoris   Cerebrovascular disease   S/P BKA (below knee amputation) bilateral (HCC)   Demand ischemia due to type 2 NSTEMI (HCC)   Type 2 diabetes mellitus with hyperlipidemia (HCC)   Chronic iron  deficiency anemia   Acute on chronic combined systolic and diastolic CHF (congestive heart failure) (HCC)   Acute renal failure superimposed on stage 3a chronic kidney disease (HCC)   PAF (  paroxysmal atrial fibrillation) (HCC)   Lacunar infarct, acute (HCC)    Recommendations/Plan: Continue comfort care Start scheduled dilaudid  1 mg IV every 4 hours Avoid morphine  Increased robinul  to 0.6 mg IV PMT will continue to follow  Symptom Management:  Dilaudid  prn for breakthrough pain Lorazepam  (ATIVAN ) prn for anxiety Haloperidol (HALDOL) prn for agitation  Glycopyrrolate  (ROBINUL ) for excessive secretions Ondansetron  (ZOFRAN ) prn for nausea Polyvinyl alcohol  (LIQUIFILM TEARS) prn for dry eyes Antiseptic oral rinse (BIOTENE) prn for dry mouth   Code Status: DNR - comfort   Prognosis:  Hours - Days  Discharge Planning: Anticipated Hospital Death  Care plan was discussed with Dr. Jillian  Thank you for allowing the Palliative Medicine Team to assist in the care of this patient.   MDM - High   Recardo KATHEE Loll, NP   Please contact Palliative Medicine Team phone at [phone] for questions and concerns.  For individual providers, please see AMION.

## 2023-11-27 LAB — CULTURE, BLOOD (ROUTINE X 2)
Culture: NO GROWTH
Culture: NO GROWTH

## 2023-12-03 ENCOUNTER — Ambulatory Visit: Admitting: Nurse Practitioner

## 2023-12-18 NOTE — Death Summary Note (Signed)
 Death Summary  [name] FMW:[id number] DOB: [birth year] DOA: 18-Nov-2023  PCP: Arnaez Zapata, Gerardo E, MD  Admit date: Nov 18, 2023 Date of Death: 12/04/2023 Time of Death: 0708    History of present illness:  Patient is a 68 y.o. F with obesity, sCHF EF 20-25%, recent NSTEMI treated medically, DM and hx bilat BKA, HTN, CKD IIIa baseline 1.2 and hx stroke without residual who presented with SOB.She was recently admitted for NSTEMI/CHF, underwent LHC that showed severe calcific disease, 80% mid-LAD, 90% prox RCA and 90% ramus intermedius, no PCI due to technically challenging targets, not CABG candidate with EF 20-25%. On PD since x-ray showed bilateral opacities. Lactate was more than 5. Patient was noticed to be in A-fib. Patient was started on antibiotics for pneumonia/sepsis. Cardiology consulted. Hospital course completed by renal failure, nephrology consulted and started on dialysis on 7/1. Patient subsequently became nonverbal, MRI showed 5 mm stroke in the right frontal lobe. Palliative care consulted due to poor prognosis. After discussion with family, she was transitioned to comfort care.  She passed away peacefully this morning.  The results of significant diagnostics from this hospitalization (including imaging, microbiology, ancillary and laboratory) are listed below for reference.    Significant Diagnostic Studies: DG Chest 2 View Result Date: 11/22/2023 CLINICAL DATA:  Hypoxia EXAM: CHEST - 2 VIEW COMPARISON:  None Available. FINDINGS: Stable enlarged cardiac silhouette. Central venous line unchanged. Low lung volumes. No effusion, infiltrate or pneumothorax identified. Some limitation due to body habitus and single view. IMPRESSION: Low lung volumes. No acute cardiopulmonary process. Electronically Signed   By: Jackquline Boxer M.D.   On: 11/22/2023 13:02   DG CHEST PORT 1 VIEW Result Date: 11/19/2023 CLINICAL DATA:  [id number] Dyspnea [id number] EXAM: PORTABLE CHEST 1 VIEW COMPARISON:   Chest x-ray 11/16/2023 FINDINGS: Right internal jugular central venous catheter with tip overlying the right atrium. The heart and mediastinal contours are unchanged. Atherosclerotic plaque. Low lung volumes. Limited evaluation of the right apex due to overlying mandible. No focal consolidation. No pulmonary edema. Likely bilateral trace pleural effusions. No pneumothorax. No acute osseous abnormality. IMPRESSION: 1. Likely bilateral trace pleural effusions. Consider repeat PA and lateral view of the chest with improved inspiratory effort. 2. Persistent cardiomegaly. 3.  Aortic Atherosclerosis (ICD10-I70.0). Electronically Signed   By: Morgane  Naveau M.D.   On: 11/19/2023 17:14   DG Swallowing Func-Speech Pathology Result Date: 11/19/2023 Table formatting from the original result was not included. Modified Barium Swallow Study Patient Details Name: [name] MRN: [id number] Date of Birth: [birth year] Today's Date: 11/19/2023 HPI/PMH: HPI: [name] is a 68 yo female presenting to ED 2023-11-18 with SOB after recent admission for NSTEMI (not a candidate for CABG or PCI). Admitted with sepsis and CAP. CT Chest shows extensive patchy and ground-glass opacity in both lungs with progression, compatible with worsening pulmonary edema and possible PNA. During HD cath placement 6/30, there was a sudden change to nonverbal and subsequent MRI showed R frontal CVA. Failed swallow screen 7/1 but unable to coordinate evaluation due to intermittent BiPAP use and HD. Previously seen by SLP in Dec 15, 2011 and Dec 14, 2012 with a functional appearing swallow and memory deficits. PMH includes SOB, NSTEMI, prior CVA, CKD 3A, bil BKA, HTN, DM, IBS, anemia, and diabetic retinopathy Clinical Impression: Clinical Impression: Pt has oral more than pharyngeal deficits, including incomplete labial seal and reduced lingual control/coordination. Mastication is incomplete with small, softened bite of cracker. Her lingual propulsion is brisk and her  swallow initiates very  timely, but there is some premature loss especially with liquids that results in trace penetration of thin and nectar thick liquids (PAS 2-3) and at least one instance of aspiration of thin liquids (PAS 8). Visibility of the cords was somewhat limited and there were other times during the study when pt did cough, but aspiration could not be visualized. Pt is not able to implement compensatory strategies herself at this time due to current cognitive-linguisitc abilities. Recommend starting with Dys 1 (puree) diet and nectar thick liquids. Factors that may increase risk of adverse event in presence of aspiration Noe & Lianne 2021): Factors that may increase risk of adverse event in presence of aspiration Noe & Lianne 2021): Reduced cognitive function; Limited mobility; Dependence for feeding and/or oral hygiene Recommendations/Plan: Swallowing Evaluation Recommendations Swallowing Evaluation Recommendations Recommendations: PO diet PO Diet Recommendation: Dysphagia 1 (Pureed); Mildly thick liquids (Level 2, nectar thick) Liquid Administration via: Cup; Straw Medication Administration: Crushed with puree Supervision: Staff to assist with self-feeding; Full supervision/cueing for swallowing strategies Swallowing strategies  : Minimize environmental distractions; Slow rate; Small bites/sips; Follow solids with liquids Postural changes: Position pt fully upright for meals; Stay upright 30-60 min after meals Oral care recommendations: Oral care BID (2x/day) Treatment Plan Treatment Plan Treatment recommendations: Therapy as outlined in treatment plan below Follow-up recommendations: Skilled nursing-short term rehab (<3 hours/day) Functional status assessment: Patient has had a recent decline in their functional status and demonstrates the ability to make significant improvements in function in a reasonable and predictable amount of time. Treatment frequency: Min 2x/week Treatment duration:  2 weeks Interventions: Aspiration precaution training; Patient/family education; Trials of upgraded texture/liquids; Diet toleration management by SLP Recommendations Recommendations for follow up therapy are one component of a multi-disciplinary discharge planning process, led by the attending physician.  Recommendations may be updated based on patient status, additional functional criteria and insurance authorization. Assessment: Orofacial Exam: Orofacial Exam Oral Cavity - Dentition: Adequate natural dentition Anatomy: No data recorded Boluses Administered: No data recorded Oral Impairment Domain: Oral Impairment Domain Lip Closure: Escape beyond mid-chin Tongue control during bolus hold: Posterior escape of greater than half of bolus (does not hold - aphasia) Bolus preparation/mastication: Disorganized chewing/mashing with solid pieces of bolus unchewed Bolus transport/lingual motion: Brisk tongue motion Oral residue: Trace residue lining oral structures Location of oral residue : Tongue Initiation of pharyngeal swallow : Pyriform sinuses  Pharyngeal Impairment Domain: Pharyngeal Impairment Domain Soft palate elevation: No bolus between soft palate (SP)/pharyngeal wall (PW) (does have some escape into nasopharynx, but cleared by the time of maximum displacement) Laryngeal elevation: Complete superior movement of thyroid  cartilage with complete approximation of arytenoids to epiglottic petiole Anterior hyoid excursion: Partial anterior movement Epiglottic movement: Complete inversion Laryngeal vestibule closure: Complete, no air/contrast in laryngeal vestibule Pharyngeal stripping wave : Present - complete Pharyngeal contraction (A/P view only): N/A Pharyngoesophageal segment opening: Complete distension and complete duration, no obstruction of flow Tongue base retraction: No contrast between tongue base and posterior pharyngeal wall (PPW) Pharyngeal residue: Complete pharyngeal clearance Location of pharyngeal  residue: N/A  Esophageal Impairment Domain: Esophageal Impairment Domain Esophageal clearance upright position: -- (full esophageal sweep not done but did see some retention in proximal esophagus during trials) Pill: No data recorded Penetration/Aspiration Scale Score: Penetration/Aspiration Scale Score 1.  Material does not enter airway: Moderately thick liquids (Level 3, honey thick); Puree; Solid 3.  Material enters airway, remains ABOVE vocal cords and not ejected out: Mildly thick liquids (Level 2, nectar thick) 8.  Material  enters airway, passes BELOW cords without attempt by patient to eject out (silent aspiration) : Thin liquids (Level 0) Compensatory Strategies: No data recorded  General Information: Caregiver present: No  Diet Prior to this Study: NPO   Temperature : Normal   Respiratory Status: Tachypneic   Supplemental O2: Nasal cannula   History of Recent Intubation: No  Behavior/Cognition: Alert; Cooperative; Doesn't follow directions Self-Feeding Abilities: Needs hand-over-hand assist for feeding Baseline vocal quality/speech: Not observed No data recorded Volitional Swallow: Unable to elicit Exam Limitations: Limited visibility Goal Planning: Prognosis for improved oropharyngeal function: Fair Barriers to Reach Goals: Language deficits No data recorded Patient/Family Stated Goal: difficulty stating Consulted and agree with results and recommendations: Pt unable/family or caregiver not available Pain: Pain Assessment Pain Assessment: Faces Faces Pain Scale: 0 Pain Intervention(s): Monitored during session End of Session: Start Time:SLP Start Time (ACUTE ONLY): 0901 Stop Time: SLP Stop Time (ACUTE ONLY): 9081 Time Calculation:SLP Time Calculation (min) (ACUTE ONLY): 17 min Charges: SLP Evaluations $ SLP Speech Visit: 1 Visit SLP Evaluations $BSS Swallow: 1 Procedure $MBS Swallow: 1 Procedure $ SLP EVAL LANGUAGE/SOUND PRODUCTION: 1 Procedure SLP visit diagnosis: SLP Visit Diagnosis: Dysphagia,  oropharyngeal phase (R13.12) Past Medical History: Past Medical History: Diagnosis Date  Atypical chest pain   a. non obstructive by cath in 2008 and 2010;  b. 02/2012 Myoview : non-ischemic, EF 57%  Cellulitis   a. left foot-> s/p L BKA  Chronic kidney disease   Constipation   CVA (cerebral infarction)   a.  Small right parietal noted incidentally 04/2007;  b. right sided embolic CVA 05/2012;  c. TEE 2/14:  LVH, EF 55-60%, mild LAE, no LAA clot, no PFO, no R->L shunt by echo contrast, oscillating density on AV likely Lambl's Excressence   Diabetes mellitus, type II (HCC)   Diabetic retinopathy (HCC)   NPDR w/edema OU  Diaphragmatic hernia without mention of obstruction or gangrene   Dry skin   Esophageal reflux   HTN (hypertension)   Hx of BKA (HCC)   Bilateral  Hyperlipidemia   Irritable bowel syndrome   Morbid obesity (HCC)   Obesity, unspecified   Pain in shoulder 06/28/2015  Peripheral neuropathy   Stroke [facility])   2013-'no residual'  Syncope and collapse   a. near-syncopal episode in November 2008;  b. 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  Tobacco abuse   Vertigo  Past Surgical History: Past Surgical History: Procedure Laterality Date  AMPUTATION  12/30/2011  Procedure: AMPUTATION RAY;  Surgeon: Norleen Armor, MD;  Location: [facility] OR;  Service: Orthopedics;  Laterality: Left;  LEFT FIRST RAY AMPUTATION  AMPUTATION  01/13/2012  Procedure: AMPUTATION BELOW KNEE;  Surgeon: Norleen Armor, MD;  Location: [facility] OR;  Service: Orthopedics;  Laterality: Left;  AMPUTATION  03/04/2012  Procedure: AMPUTATION BELOW KNEE;  Surgeon: Norleen Armor, MD;  Location: [facility] OR;  Service: Orthopedics;  Laterality: Left;  Revision of Left Below Knee Amputation  AMPUTATION Right 11/02/2013  Procedure: AMPUTATION BELOW KNEE;  Surgeon: Jerona Harden GAILS, MD;  Location: [facility] OR;  Service: Orthopedics;  Laterality: Right;  Right Below Knee Amputation  BIOPSY  03/29/2019  Procedure: BIOPSY;  Surgeon: San Sandor GAILS, DO;  Location: [facility] ENDOSCOPY;  Service:  Gastroenterology;;  EGD and Colon  CARDIAC CATHETERIZATION    LAD 30%, circumflex 50%, OM 75%, RI 60% with small branch 80%, dominant RCA 60%, EF 45-50%  CHOLECYSTECTOMY    COLONOSCOPY    COLONOSCOPY WITH PROPOFOL  N/A 03/29/2019  Procedure: COLONOSCOPY WITH PROPOFOL ;  Surgeon: Cirigliano, Vito  V, DO;  Location: [facility] ENDOSCOPY;  Service: Gastroenterology;  Laterality: N/A;  ESOPHAGOGASTRODUODENOSCOPY (EGD) WITH PROPOFOL  N/A 03/29/2019  Procedure: ESOPHAGOGASTRODUODENOSCOPY (EGD) WITH PROPOFOL ;  Surgeon: San Sandor GAILS, DO;  Location: [facility] ENDOSCOPY;  Service: Gastroenterology;  Laterality: N/A;  EYE SURGERY Right   cataract  I & D EXTREMITY  12/23/2011  Procedure: IRRIGATION AND DEBRIDEMENT EXTREMITY;  Surgeon: Elspeth JONELLE Her, MD;  Location: [facility] OR;  Service: Orthopedics;  Laterality: Left;  Left Foot  I & D EXTREMITY  12/26/2011  Procedure: IRRIGATION AND DEBRIDEMENT EXTREMITY;  Surgeon: Norleen Armor, MD;  Location: [facility] OR;  Service: Orthopedics;  Laterality: Left;  LEFT FOOT I&D WITH POSSIBLE WOUND VAC APPLICATION, POSSIBLE LEFT FIRST RAY AMPUTATION  I & D EXTREMITY Right 10/29/2013  Procedure: IRRIGATION AND DEBRIDEMENT EXTREMITY;  Surgeon: Reyes JAYSON Billing, MD;  Location: [facility] OR;  Service: Orthopedics;  Laterality: Right;  Invasive Electrophysiologic Study  5/09  followed by insertion of an implantable loop recorder. S/p removal   IR FLUORO GUIDE CV LINE RIGHT  11/16/2023  IR US  GUIDE VASC ACCESS RIGHT  11/16/2023  Multiple Toe Surgeries    POLYPECTOMY  03/29/2019  Procedure: POLYPECTOMY;  Surgeon: San Sandor GAILS, DO;  Location: [facility] END[facility];  Service: Gastroenterology;;  RIGHT/LEFT HEART CATH AND CORONARY ANGIOGRAPHY N/A 09/07/2023  Procedure: RIGHT/LEFT HEART CATH AND CORONARY ANGIOGRAPHY;  Surgeon: Anner Alm ORN, MD;  Location: [facility] INVASIVE CV LAB;  Service: Cardiovascular;  Laterality: N/A;  TEE WITHOUT CARDIOVERSION N/A 07/07/2012  Procedure: TRANSESOPHAGEAL ECHOCARDIOGRAM (TEE);  Surgeon: Redell GORMAN Shallow, MD;   Location: [facility] ENDOSCOPY;  Service: Cardiovascular;  Laterality: N/A; Leita SAILOR., M.A. CCC-SLP Acute Rehabilitation Services Office: [phone] Secure chat preferred 11/19/2023, 9:57 AM  VAS US  CAROTID Result Date: 11/18/2023 Carotid Arterial Duplex Study Patient Name:  [name]  Date of Exam:   11/18/2023 Medical Rec #: [id number]         Accession #:    [id number] Date of Birth: 1955/08/22          Patient Gender: F Patient Age:   41 years Exam Location:  [facility] Procedure:      VAS US  CAROTID Referring Phys: ELLOUISE MARI --------------------------------------------------------------------------------  Indications:       CVA and SOB. Risk Factors:      Hypertension, hyperlipidemia, Diabetes, PAD. Other Factors:     Bilateral BKA. Limitations        Today's exam was limited due to the body habitus of the                    patient and temporary hemodialysis catheter placed right                    lower neck and coughing throughout the exam. Comparison Study:  No recent carotid studies. Performing Technologist: Ricka Sturdivant-Jones RDMS, RVT  Examination Guidelines: A complete evaluation includes B-mode imaging, spectral Doppler, color Doppler, and power Doppler as needed of all accessible portions of each vessel. Bilateral testing is considered an integral part of a complete examination. Limited examinations for reoccurring indications may be performed as noted.  Right Carotid Findings: +----------+--------+--------+--------+------------------+------------------+           PSV cm/sEDV cm/sStenosisPlaque DescriptionComments           +----------+--------+--------+--------+------------------+------------------+ CCA Prox  52      18                                                   +----------+--------+--------+--------+------------------+------------------+  CCA Distal54      14                                intimal thickening  +----------+--------+--------+--------+------------------+------------------+ ICA Prox  100     29      1-39%   calcific                             +----------+--------+--------+--------+------------------+------------------+ ICA Distal58      25                                                   +----------+--------+--------+--------+------------------+------------------+ ECA       107                                                          +----------+--------+--------+--------+------------------+------------------+ +----------+--------+-------+------------+-------------------+           PSV cm/sEDV cmsDescribe    Arm Pressure (mmHG) +----------+--------+-------+------------+-------------------+ Subclavian               Not assessed                    +----------+--------+-------+------------+-------------------+ +---------+--------+--------+------------+ VertebralPSV cm/sEDV cm/sNot assessed +---------+--------+--------+------------+  Left Carotid Findings: +----------+--------+--------+--------+------------------+------------------+           PSV cm/sEDV cm/sStenosisPlaque DescriptionComments           +----------+--------+--------+--------+------------------+------------------+ CCA Prox  55      10                                intimal thickening +----------+--------+--------+--------+------------------+------------------+ CCA Mid                                             intimal thickening +----------+--------+--------+--------+------------------+------------------+ CCA Distal50      17                                intimal thickening +----------+--------+--------+--------+------------------+------------------+ ICA Prox  55      15      1-39%                     intimal thickening +----------+--------+--------+--------+------------------+------------------+ ICA Distal82      25                                                    +----------+--------+--------+--------+------------------+------------------+ ECA       59      12                                                   +----------+--------+--------+--------+------------------+------------------+ +----------+--------+--------+----------------+-------------------+  PSV cm/sEDV cm/sDescribe        Arm Pressure (mmHG) +----------+--------+--------+----------------+-------------------+ [id number]             Multiphasic, WNL                    +----------+--------+--------+----------------+-------------------+ +---------+--------+--+--------+--+---------+ VertebralPSV cm/s46EDV cm/s14Antegrade +---------+--------+--+--------+--+---------+   Summary: Right Carotid: Velocities in the right ICA are consistent with a 1-39% stenosis. Left Carotid: Velocities in the left ICA are consistent with a 1-39% stenosis.  *See table(s) above for measurements and observations.  Electronically signed by Gaile New MD on 11/18/2023 at 9:56:07 PM.    Final    EEG adult Result Date: 11/17/2023 Shelton Arlin KIDD, MD     11/17/2023  5:49 PM Patient Name: [name] MRN: [id number] Epilepsy Attending: Arlin KIDD Shelton Referring Physician/Provider: everitt Clint Abbey Earle FORBES, NP Date: 11/17/2023 Duration: 25.36 mins Patient history: 78y F with ams. EEG to evaluate for seizure Level of alertness: Awake AEDs during EEG study: None Technical aspects: This EEG study was done with scalp electrodes positioned according to the 10-20 International system of electrode placement. Electrical activity was reviewed with band pass filter of 1-70Hz , sensitivity of 7 uV/mm, display speed of 67mm/sec with a 60Hz  notched filter applied as appropriate. EEG data were recorded continuously and digitally stored.  Video monitoring was available and reviewed as appropriate. Description: EEG showed continuous generalized predominantly 5 to 6 Hz theta slowing admixed with intermittent 2-3hz  delta  slowing. Hyperventilation and photic stimulation were not performed.   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 No seizures or epileptiform discharges were seen throughout the recording. Priyanka KIDD Shel[place]   CT HEAD WO CONTRAST ( ) Result Date: 11/17/2023 CLINICAL DATA:  Mental status change, persistent or worsening EXAM: CT HEAD WITHOUT CONTRAST TECHNIQUE: Contiguous axial images were obtained from the base of the skull through the vertex without intravenous contrast. RADIATION DOSE REDUCTION: This exam was performed according to the departmental dose-optimization program which includes automated exposure control, adjustment of the mA and/or kV according to patient size and/or use of iterative reconstruction technique. COMPARISON:  MRI head from earlier today. FINDINGS: Brain: Known acute infarct in the right frontal lobe better characterized on recent MRI. No progressive mass effect or acute hemorrhage. No midline shift. No hydrocephalus. Patchy white matter disease, compatible with chronic microvascular ischemic disease. Small remote bilateral basal ganglia lacunar infarcts. Small remote left cerebellar infarct. Cerebral atrophy. Vascular: Calcific atherosclerosis. Skull: No acute fracture. Sinuses/Orbits: Clear sinuses.  No acute orbital findings. Other: No mastoid effusions. IMPRESSION: Known small acute infarct in the right frontal lobe better characterized on recent MRI. No progressive mass effect or acute hemorrhage. Electronically Signed   By: Gilmore GORMAN Molt M.D.   On: 11/17/2023 01:33   MR BRAIN WO CONTRAST Result Date: 11/17/2023 EXAM DESCRIPTION: MR BRAIN WO CONTRAST CLINICAL HISTORY: Mental status change, unknown cause COMPARISON: None available TECHNIQUE: Non contrast MRI of the brain is performed according to our usual protocol including multiplanar multi sequence technique. FINDINGS: There is a 5 mm acute/subacute  infarct in the right frontal lobe seen on diffusion image 82. No other acute intracranial hemorrhage, mass, edema, or hydrocephalus. Mild to moderate cortical atrophy. Very small chronic infarcts left cerebellum. Small bilateral chronic basal ganglia lacunar infarcts. Mild to moderate white matter disease suggesting chronic small vessel ischemic change. The vascular flow voids are unremarkable. No significant sinus disease. IMPRESSION: 5 mm acute/subacute infarct in the right frontal  lobe. Age-related change. Very small chronic infarcts in the left cerebellum. Electronically signed by: Reyes Frees MD 11/17/2023 12:35 AM EDT RP Workstation: [id number]   DG CHEST PORT 1 VIEW Result Date: 11/16/2023 CLINICAL DATA:  Rales. EXAM: PORTABLE CHEST 1 VIEW COMPARISON:  Radiograph 11/12/2023 FINDINGS: Right-sided dialysis catheter with tip overlying the right atrium. No pneumothorax. Cardiomegaly is unchanged. Mediastinal contours are stable. Improving vascular congestion. Suspected small pleural effusions. IMPRESSION: 1. Improving vascular congestion. Suspected small pleural effusions. 2. Unchanged cardiomegaly. 3. Right dialysis catheter tip overlies the right atrium. Electronically Signed   By: Andrea Gasman M.D.   On: 11/16/2023 21:42   IR Fluoro Guide CV Line Right Result Date: 11/16/2023 CLINICAL DATA:  Renal failure and need for non tunneled dialysis catheter for immediate hemodialysis. EXAM: NON-TUNNELED CENTRAL VENOUS HEMODIALYSIS CATHETER PLACEMENT WITH ULTRASOUND AND FLUOROSCOPIC GUIDANCE FLUOROSCOPY: Radiation Exposure Index: 17.3 mGy Kerma PROCEDURE: The procedure, risks, benefits, and alternatives were explained to the patient. Questions regarding the procedure were encouraged and answered. The patient understands and consents to the procedure. A time out was performed prior to initiating the procedure. Ultrasound was used to confirm patency of the right internal jugular vein. An ultrasound image was  saved and recorded. The right neck and chest were prepped with chlorhexidine  in a sterile fashion, and a sterile drape was applied covering the operative field. Maximum barrier sterile technique with sterile gowns and gloves were used for the procedure. Local anesthesia was provided with 1% lidocaine . After creating a small venotomy incision, a 21 gauge needle was advanced into the right internal jugular vein under direct, real-time ultrasound guidance. Ultrasound image documentation was performed. After securing guidewire access,the venotomy was dilated over a guide wire. A 16 cm, 13 Fr, triple lumen non-tunneled dialysis catheter was advanced over the wire. Final catheter positioning was confirmed and documented with a fluoroscopic spot image. The catheter was aspirated, flushed with saline, and injected with appropriate volume heparin  dwells. The catheter exit site was secured with 0-Prolene retention sutures. COMPLICATIONS: None.  No pneumothorax. FINDINGS: After catheter placement, the tip lies at the cavoatrial junction. The catheter aspirates normally and is ready for immediate use. IMPRESSION: Placement of non-tunneled central venous hemodialysis catheter via the right internal jugular vein. The catheter tip lies at the cavoatrial junction. The catheter is ready for immediate use. Electronically Signed   By: Marcey Moan M.D.   On: 11/16/2023 17:03   IR US  Guide Vasc Access Right Result Date: 11/16/2023 CLINICAL DATA:  Renal failure and need for non tunneled dialysis catheter for immediate hemodialysis. EXAM: NON-TUNNELED CENTRAL VENOUS HEMODIALYSIS CATHETER PLACEMENT WITH ULTRASOUND AND FLUOROSCOPIC GUIDANCE FLUOROSCOPY: Radiation Exposure Index: 17.3 mGy Kerma PROCEDURE: The procedure, risks, benefits, and alternatives were explained to the patient. Questions regarding the procedure were encouraged and answered. The patient understands and consents to the procedure. A time out was performed prior to  initiating the procedure. Ultrasound was used to confirm patency of the right internal jugular vein. An ultrasound image was saved and recorded. The right neck and chest were prepped with chlorhexidine  in a sterile fashion, and a sterile drape was applied covering the operative field. Maximum barrier sterile technique with sterile gowns and gloves were used for the procedure. Local anesthesia was provided with 1% lidocaine . After creating a small venotomy incision, a 21 gauge needle was advanced into the right internal jugular vein under direct, real-time ultrasound guidance. Ultrasound image documentation was performed. After securing guidewire access,the venotomy was dilated over a guide wire.  A 16 cm, 13 Fr, triple lumen non-tunneled dialysis catheter was advanced over the wire. Final catheter positioning was confirmed and documented with a fluoroscopic spot image. The catheter was aspirated, flushed with saline, and injected with appropriate volume heparin  dwells. The catheter exit site was secured with 0-Prolene retention sutures. COMPLICATIONS: None.  No pneumothorax. FINDINGS: After catheter placement, the tip lies at the cavoatrial junction. The catheter aspirates normally and is ready for immediate use. IMPRESSION: Placement of non-tunneled central venous hemodialysis catheter via the right internal jugular vein. The catheter tip lies at the cavoatrial junction. The catheter is ready for immediate use. Electronically Signed   By: Marcey Moan M.D.   On: 11/16/2023 17:03   US  RENAL Result Date: 11/14/2023 CLINICAL DATA:  AKI EXAM: RENAL / URINARY TRACT ULTRASOUND COMPLETE COMPARISON:  CT abdomen pelvis 06/09/2018 FINDINGS: Right Kidney: Renal measurements: 11.8 x 5.5 x 5.6 cm = volume: 192 mL. Mildly increased cortical echogenicity. Possible 9 mm nonobstructing calculus in the right kidney. No hydronephrosis. Left Kidney: Renal measurements: 9.6 x 5.8 x 5.0 cm = volume: 145 mL. Mildly increased  echogenicity. No mass or hydronephrosis. Bladder: Appears normal for degree of bladder distention. Other: None. IMPRESSION: 1. Mildly increased cortical echogenicity bilaterally as can be seen with medical renal disease. 2. Possible 9 mm nonobstructing calculus in the right kidney. Electronically Signed   By: Norman Gatlin M.D.   On: 11/14/2023 19:52   DG CHEST PORT 1 VIEW Result Date: 11/12/2023 CLINICAL DATA:  Dyspnea EXAM: PORTABLE CHEST 1 VIEW COMPARISON:  Chest x-ray performed November 11, 2023 FINDINGS: Large heart with central pulmonary vascular congestion. Modest improvement in basilar aeration. No pneumothorax. IMPRESSION: 1. Enlarged heart with pulmonary vascular congestion. 2. Improvement in basilar aeration. Electronically Signed   By: Maude Naegeli M.D.   On: 11/12/2023 15:20   CT Angio Chest PE W and/or Wo Contrast Result Date: 11/11/2023 CLINICAL DATA:  Shortness of breath. EXAM: CT ANGIOGRAPHY CHEST WITH CONTRAST TECHNIQUE: Multidetector CT imaging of the chest was performed using the standard protocol during bolus administration of intravenous contrast. Multiplanar CT image reconstructions and MIPs were obtained to evaluate the vascular anatomy. RADIATION DOSE REDUCTION: This exam was performed according to the departmental dose-optimization program which includes automated exposure control, adjustment of the mA and/or kV according to patient size and/or use of iterative reconstruction technique. CONTRAST:  75mL OMNIPAQUE  IOHEXOL  350 MG/ML SOLN COMPARISON:  09/06/2023.  Portable chest obtained earlier today. FINDINGS: Cardiovascular: Stable enlarged heart. No pericardial effusion. Atheromatous calcifications, including the coronary arteries and aorta. Normally opacified pulmonary arteries with no pulmonary arterial filling defects seen. Mediastinum/Nodes: The previously demonstrated 16 mm right hilar lymph node currently has a short axis diameter of 16.7 mm on image number 58/4. A previously  demonstrated enlarged subcarinal node has a short axis diameter of 13 mm on image number 69/4, previously 14 mm. Unremarkable thyroid  gland, trachea and esophagus. Lungs/Pleura: Extensive patchy and ground-glass opacity in both lungs with progression. Small to moderate-sized right pleural effusion and small left pleural effusion, both increased. Upper Abdomen: Atheromatous arterial calcifications. 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 Review of the MIP images confirms the above findings. IMPRESSION: 1. No pulmonary emboli. 2. Extensive patchy and ground-glass opacity in both lungs with progression, compatible with worsening pulmonary edema and possible pneumonia. 3. Small to moderate-sized right pleural effusion and small left pleural effusion, both increased. 4. Stable cardiomegaly. 5.  Calcific coronary artery  and aortic atherosclerosis. 6. Stable right hilar and subcarinal adenopathy, nonspecific. Aortic Atherosclerosis (ICD10-I70.0). Electronically Signed   By: Elspeth Bathe M.D.   On: 11/11/2023 15:55   DG Chest Port 1 View Result Date: 11/11/2023 CLINICAL DATA:  Shortness of breath. EXAM: PORTABLE CHEST 1 VIEW COMPARISON:  September 13, 2023 FINDINGS: The cardiac silhouette is enlarged and unchanged in size. Mild to moderate severity calcification of the aortic arch is seen. Low lung volumes are noted. There is mild prominence of the pulmonary vasculature with mild areas of atelectasis and/or infiltrate seen within the bilateral lung bases. No pleural effusion or pneumothorax is identified. Multilevel degenerative changes are seen throughout the thoracic spine. IMPRESSION: 1. Cardiomegaly with mild pulmonary vascular congestion. 2. Mild bibasilar atelectasis and/or infiltrate. Electronically Signed   By: Suzen Dials M.D.   On: 11/11/2023 14:08    Microbiology: Recent Results (from the past 240 hours)  Culture, blood (Routine  X 2) w Reflex to ID Panel     Status: None (Preliminary result)   Collection Time: 11/22/23  9:09 AM   Specimen: BLOOD LEFT HAND  Result Value Ref Range Status   Specimen Description BLOOD LEFT HAND  Final   Special Requests   Final    BOTTLES DRAWN AEROBIC ONLY Blood Culture results may not be optimal due to an inadequate volume of blood received in culture bottles   Culture   Final    NO GROWTH 4 DAYS Performed at [facility], [address] [place], [place] [address]    Report Status PENDING  Incomplete  Culture, blood (Routine X 2) w Reflex to ID Panel     Status: None (Preliminary result)   Collection Time: 11/22/23  9:14 AM   Specimen: BLOOD  Result Value Ref Range Status   Specimen Description BLOOD BLOOD RIGHT HAND  Final   Special Requests   Final    AEROBIC BOTTLE ONLY Blood Culture results may not be optimal due to an inadequate volume of blood received in culture bottles   Culture   Final    NO GROWTH 4 DAYS Performed at [facility], [address], [place], [place] [address]    Report Status PENDING  Incomplete     Labs: Basic Metabolic Panel: Recent Labs  Lab 11/22/23 0347 11/23/23 0354 11/24/23 0825  NA 135 137 135  K 3.8 2.5* 2.5*  CL 96* 99 99  CO2 21* 20* 19*  GLUCOSE 333* 207* 194*  BUN 47* 68* 90*  CREATININE 4.81* 5.82* 7.25*  CALCIUM  7.3* 7.2* 6.8*  PHOS  --   --  5.3*   Liver Function Tests: Recent Labs  Lab 11/24/23 0825  ALBUMIN  2.2*   No results for input(s): LIPASE, AMYLASE in the last 168 hours. No results for input(s): AMMONIA in the last 168 hours. CBC: Recent Labs  Lab 11/22/23 0743 11/23/23 0354 11/24/23 0825  WBC 22.5* 25.4* 25.3*  HGB 9.6* 9.4* 8.3*  HCT 31.4* 30.9* 27.0*  MCV 86.5 87.5 87.1  PLT 191 197 166   Cardiac Enzymes: No results for input(s): CKTOTAL, CKMB, CKMBINDEX, TROPONINI in the last 168 hours. D-Dimer No results for input(s): DDIMER in the last 72 hours. BNP: Invalid  input(s): POCBNP CBG: Recent Labs  Lab 11/23/23 1621 11/23/23 2128 11/24/23 0614 11/24/23 1152 11/24/23 1605  GLUCAP 270* 198* 210* 98 161*   Anemia work up No results for input(s): VITAMINB12, FOLATE, FERRITIN, TIBC, IRON , RETICCTPCT in the last 72 hours. Urinalysis  Component Value Date/Time   COLORURINE AMBER (A) 11/23/2023 0916   APPEARANCEUR CLOUDY (A) 11/23/2023 0916   LABSPEC 1.025 11/23/2023 0916   PHURINE 5.0 11/23/2023 0916   GLUCOSEU NEGATIVE 11/23/2023 0916   HGBUR MODERATE (A) 11/23/2023 0916   BILIRUBINUR NEGATIVE 11/23/2023 0916   KETONESUR 5 (A) 11/23/2023 0916   PROTEINUR 100 (A) 11/23/2023 0916   UROBILINOGEN 0.2 12/31/2014 1928   NITRITE NEGATIVE 11/23/2023 0916   LEUKOCYTESUR LARGE (A) 11/23/2023 0916   Sepsis Labs Recent Labs  Lab 11/22/23 0743 11/23/23 0354 11/24/23 0825  WBC 22.5* 25.4* 25.3*       SIGNED:  Ivonne Mustache, MD  [facility] 2023-12-23, 7:41 AM Pager [phone]  If 7PM-7AM, please contact night-coverage [email or web address] Password

## 2023-12-18 NOTE — Progress Notes (Signed)
 TOD 0708, made [place], RN aware.

## 2023-12-18 NOTE — Plan of Care (Signed)

## 2023-12-18 NOTE — Progress Notes (Signed)
   2023/12/09 0740  Attending Physican Contact  Attending Physician Notified Y  Attending Physician (First and Last Name) Dr. Ivonne Mustache  Post Mortem Checklist  Date of Death Dec 09, 2023  Time of Death 0708  Pronounced By Freddrick Pear, RN  Next of kin notified Yes  Name of next of kin notified of death Luane Pouch  Contact Person's Relationship to Patient Other relative (Niece)  Contact Person's Phone Number [phone]  Contact Person's address [address] [place], [place] [address]  Family Communication Notes call neice cell phone  Was the patient a No Code Blue or a Limited Code Blue? Yes  Did the patient die unattended? Yes  Patient restrained (physical/manual hold/chemical)? *See row information* Not applicable  Body preparation complete Y  [facility] (previously known as [facility])  Notification Date December 09, 2023  Notification Time 0743  [facility] Number [phone]  Is patient a potential donor? N  Autopsy  Autopsy requested by MD or Family ( Non ME Case) N/A  Patient and Hospital Property Returned  Patient is satisfied that all belongings have been returned? Yes  Name of person receiving valuables? clothing  Specify valuables returned gown  Dermatherapy linen/gowns NOT sent with patient or transporter Not applicable  Notifications  Patient Placement notified that Post Mortem checklist is complete Yes  Patient Placement notified body transferred Transported to [email or web address]  Medical Examiner  Is this a medical examiner's case? N  Funeral Home  Funeral home name/address/phone # [facility] not listed  Name/Address/Phone # of [facility] PARAS.[place], [address] [place], [place]  Planned location of pickup Morgue   Pt niece was at bedside when we prepare the body. There is no other family to visit at this time.

## 2023-12-18 DEATH — deceased

## 2024-01-11 ENCOUNTER — Ambulatory Visit: Admitting: Cardiology
